# Patient Record
Sex: Female | Born: 1950 | Race: White | Hispanic: No | Marital: Single | Smoking: Former smoker
Health system: Southern US, Community
[De-identification: ages and names within clinical notes are randomized; demographics above are authoritative.]

## PROBLEM LIST (undated history)

## (undated) DIAGNOSIS — F32A Depression, unspecified: Secondary | ICD-10-CM

## (undated) DIAGNOSIS — J449 Chronic obstructive pulmonary disease, unspecified: Secondary | ICD-10-CM

## (undated) DIAGNOSIS — F419 Anxiety disorder, unspecified: Secondary | ICD-10-CM

## (undated) DIAGNOSIS — E785 Hyperlipidemia, unspecified: Secondary | ICD-10-CM

## (undated) DIAGNOSIS — D649 Anemia, unspecified: Secondary | ICD-10-CM

## (undated) DIAGNOSIS — I4891 Unspecified atrial fibrillation: Secondary | ICD-10-CM

## (undated) DIAGNOSIS — J45909 Unspecified asthma, uncomplicated: Secondary | ICD-10-CM

## (undated) DIAGNOSIS — Z87442 Personal history of urinary calculi: Secondary | ICD-10-CM

## (undated) DIAGNOSIS — I2510 Atherosclerotic heart disease of native coronary artery without angina pectoris: Secondary | ICD-10-CM

## (undated) DIAGNOSIS — K219 Gastro-esophageal reflux disease without esophagitis: Secondary | ICD-10-CM

## (undated) DIAGNOSIS — R519 Headache, unspecified: Secondary | ICD-10-CM

## (undated) DIAGNOSIS — J189 Pneumonia, unspecified organism: Secondary | ICD-10-CM

## (undated) DIAGNOSIS — F329 Major depressive disorder, single episode, unspecified: Secondary | ICD-10-CM

## (undated) DIAGNOSIS — K56609 Unspecified intestinal obstruction, unspecified as to partial versus complete obstruction: Secondary | ICD-10-CM

## (undated) DIAGNOSIS — M1990 Unspecified osteoarthritis, unspecified site: Secondary | ICD-10-CM

## (undated) DIAGNOSIS — I10 Essential (primary) hypertension: Secondary | ICD-10-CM

## (undated) DIAGNOSIS — M109 Gout, unspecified: Secondary | ICD-10-CM

## (undated) DIAGNOSIS — N189 Chronic kidney disease, unspecified: Secondary | ICD-10-CM

## (undated) HISTORY — DX: Essential (primary) hypertension: I10

## (undated) HISTORY — PX: ABDOMINAL HYSTERECTOMY: SHX81

## (undated) HISTORY — PX: APPENDECTOMY: SHX54

## (undated) HISTORY — PX: HERNIA REPAIR: SHX51

## (undated) HISTORY — DX: Gout, unspecified: M10.9

## (undated) HISTORY — PX: CARDIAC CATHETERIZATION: SHX172

## (undated) HISTORY — DX: Chronic kidney disease, unspecified: N18.9

## (undated) HISTORY — DX: Hyperlipidemia, unspecified: E78.5

## (undated) HISTORY — PX: ABDOMINAL SURGERY: SHX537

## (undated) HISTORY — PX: CORONARY ANGIOPLASTY: SHX604

## (undated) HISTORY — PX: FACIAL RECONSTRUCTION SURGERY: SHX631

---
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
## 2008-03-31 ENCOUNTER — Inpatient Hospital Stay (HOSPITAL_COMMUNITY): Admission: EM | Admit: 2008-03-31 | Discharge: 2008-04-03 | Payer: Self-pay | Admitting: Cardiovascular Disease

## 2008-03-31 ENCOUNTER — Ambulatory Visit: Payer: Self-pay | Admitting: Cardiovascular Disease

## 2010-05-09 LAB — DIFFERENTIAL
Basophils Relative: 0 % (ref 0–1)
Eosinophils Absolute: 0.2 10*3/uL (ref 0.0–0.7)
Lymphs Abs: 1.4 10*3/uL (ref 0.7–4.0)
Monocytes Absolute: 0.7 10*3/uL (ref 0.1–1.0)
Monocytes Relative: 10 % (ref 3–12)

## 2010-05-09 LAB — POCT I-STAT, CHEM 8
BUN: 25 mg/dL — ABNORMAL HIGH (ref 6–23)
Chloride: 110 mEq/L (ref 96–112)
Creatinine, Ser: 1.4 mg/dL — ABNORMAL HIGH (ref 0.4–1.2)
Glucose, Bld: 62 mg/dL — ABNORMAL LOW (ref 70–99)
Hemoglobin: 11.9 g/dL — ABNORMAL LOW (ref 12.0–15.0)
Potassium: 3.4 mEq/L — ABNORMAL LOW (ref 3.5–5.1)

## 2010-05-09 LAB — BASIC METABOLIC PANEL
BUN: 16 mg/dL (ref 6–23)
CO2: 28 mEq/L (ref 19–32)
Calcium: 9.3 mg/dL (ref 8.4–10.5)
Chloride: 100 mEq/L (ref 96–112)
GFR calc Af Amer: 42 mL/min — ABNORMAL LOW (ref >60)
GFR calc non Af Amer: 56 mL/min — ABNORMAL LOW (ref >60)
Potassium: 3.9 mEq/L (ref 3.5–5.1)
Potassium: 4.1 mEq/L (ref 3.5–5.1)
Sodium: 134 mEq/L — ABNORMAL LOW (ref 135–145)

## 2010-05-09 LAB — CBC
HCT: 38.3 % (ref 36.0–46.0)
Platelets: 231 10*3/uL (ref 150–400)
Platelets: 234 10*3/uL (ref 150–400)
RDW: 15.2 % (ref 11.5–15.5)
RDW: 15.2 % (ref 11.5–15.5)
WBC: 8.9 10*3/uL (ref 4.0–10.5)

## 2010-05-09 LAB — PROTIME-INR: Prothrombin Time: 12.3 seconds (ref 11.6–15.2)

## 2010-05-09 LAB — CARDIAC PANEL(CRET KIN+CKTOT+MB+TROPI)
CK, MB: 5.4 ng/mL — ABNORMAL HIGH (ref 0.3–4.0)
Relative Index: 4.6 — ABNORMAL HIGH (ref 0.0–2.5)
Total CK: 117 U/L (ref 7–177)
Troponin I: 3.84 ng/mL (ref 0.00–0.06)

## 2010-05-09 LAB — COMPREHENSIVE METABOLIC PANEL
ALT: 18 U/L (ref 0–35)
Albumin: 3.2 g/dL — ABNORMAL LOW (ref 3.5–5.2)
Alkaline Phosphatase: 87 U/L (ref 39–117)
Calcium: 9.3 mg/dL (ref 8.4–10.5)
GFR calc Af Amer: 42 mL/min — ABNORMAL LOW (ref >60)
Potassium: 3.8 mEq/L (ref 3.5–5.1)
Sodium: 139 mEq/L (ref 135–145)
Total Protein: 5.8 g/dL — ABNORMAL LOW (ref 6.0–8.3)

## 2010-05-09 LAB — CK TOTAL AND CKMB (NOT AT ARMC)
CK, MB: 75.7 ng/mL — ABNORMAL HIGH (ref 0.3–4.0)
CK, MB: 93.5 ng/mL — ABNORMAL HIGH (ref 0.3–4.0)
Relative Index: 13.9 — ABNORMAL HIGH (ref 0.0–2.5)
Relative Index: 15.2 — ABNORMAL HIGH (ref 0.0–2.5)
Total CK: 543 U/L — ABNORMAL HIGH (ref 7–177)
Total CK: 616 U/L — ABNORMAL HIGH (ref 7–177)

## 2010-05-09 LAB — TROPONIN I
Troponin I: 10.38 ng/mL (ref 0.00–0.06)
Troponin I: 4.53 ng/mL (ref 0.00–0.06)

## 2010-05-09 LAB — LIPID PANEL
HDL: 48 mg/dL (ref >39)
LDL Cholesterol: 93 mg/dL (ref 0–99)

## 2010-05-09 LAB — BRAIN NATRIURETIC PEPTIDE: Pro B Natriuretic peptide (BNP): 241 pg/mL — ABNORMAL HIGH (ref 0.0–100.0)

## 2010-05-09 LAB — TSH: TSH: 1.155 u[IU]/mL (ref 0.350–4.500)

## 2010-05-09 LAB — PLATELET COUNT: Platelets: 234 10*3/uL (ref 150–400)

## 2010-05-09 LAB — APTT: aPTT: 31 seconds (ref 24–37)

## 2010-05-09 LAB — GLUCOSE, CAPILLARY: Glucose-Capillary: 144 mg/dL — ABNORMAL HIGH (ref 70–99)

## 2010-06-11 NOTE — H&P (Signed)
NAMECHANNIN, MERKL NO.:  [id number]   MEDICAL RECORD NO.:  [id number]          PATIENT TYPE:  INP   LOCATION:  2909                         [facility]   PHYSICIAN:  Lauree Chandler, MDDATE OF BIRTH:  30-Nov-1950   DATE OF ADMISSION:  03/31/2008  DATE OF DISCHARGE:                              HISTORY & PHYSICAL   PRIMARY CARDIOLOGIST:  [facility] being seen by Juanda Bond.  Burt Knack, MD.   PATIENT PROFILE:  A 60 year old Caucasian female with prior history of  CAD who presents with acute inferolateral ST elevation MI.   PROBLEMS:  1. Acute inferolateral ST elevation MI/CAD.      a.     Status post MI x2 in 2003 and 2004 both times, both times       managed in [place] and she is status post PCI to the RCA.  2. Hypertension.  3. Hyperlipidemia.  4. Bipolar disorder.  5. Remote tobacco abuse.  6. Multiple personality disorder.  7. ADHD.  8. Borderline diabetes mellitus.  9. Status post TAH BSO.   HISTORY OF PRESENT ILLNESS:  A 60 year old Caucasian female with prior  history of CAD status post PCI to the RCA in [place].  She was in her  usual state of health until today while shopping, she developed sudden  onset of chest discomfort and was taken to the local urgent care in  [place].  ECG showed inferolateral ST elevation and code STEMI was  activated.  She was taken to the [facility] for emergent cath.  She continued to have chest pain on the table.   ALLERGIES:  PENICILLIN, MORPHINE, CODEINE, and DILAUDID.   HOME MEDICATIONS:  1. Metoprolol 200 mg daily.  2. Hyzaar 100 mg daily.  3. Prozac.  4. Plavix 75 mg daily.  5. Vytorin 10/40 mg daily.  6. Nitroglycerin p.r.n.  7. Pepcid AC.  8. Lamictal 25 mg daily.  9. Isosorbide 30 mg daily.  10.Vistaril 25 mg.  11.Protonix 40 mg daily.  12.Benadryl 25 mg p.r.n.   FAMILY HISTORY:  Mother died at age 83 with complications of coronary  artery disease and diabetes.  Father  is alive at age 43 with possible  history of cardiac aneurysm per patient.  She has 5 brothers and 7  sisters.  There is a history of MI in one of her brothers as well as  CABG.  There is a family history of diabetes.   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  She is on disability secondary to psychological issues.  She has  about a 15-pack year history of tobacco abuse, smoking 1/2 a pack a day  for 30 years, but quitting 6 weeks ago.  She previously used alcohol  quite heavily, but quit this many years ago.  She denies drug use.  She  does not routinely exercise.   REVIEW OF SYSTEMS:  Positive for chest pain, dyspnea, depression,  anxiety, bipolar disorder/mood disturbance, and borderline diabetes,  otherwise all systems reviewed are negative.  She is a full code.   PHYSICAL EXAMINATION:  VITAL SIGNS:  She is afebrile, heart rate 83,  respirations 18, blood pressure is 79/47 (on IV Nitro), pulse ox 100% on  room air.  GENERAL:  A pleasant white female in no acute distress, awake, alert,  and oriented x3.  HEENT:  Normal.  NEUROLOGIC:  Grossly intact and nonfocal.  PSYCH:  Normal affect.  SKIN:  Warm and dry without lesions or masses.  MUSCULOSKELETAL:  Grossly normal without deformity or effusion.  NECK:  No bruits or JVD.  LUNGS:  Regular, unlabored, clear to auscultation.  CARDIAC:  Regular S1 and S2.  No S3, S4, or murmurs.  ABDOMEN:  Soft, nontender, and nondistended.  Bowel sounds present.  EXTREMITIES:  Upper extremities warm and dry.  No clubbing, cyanosis, or  edema.  Dorsalis pedis, posterior tibial pulses are 2+ bilaterally.   LABORATORY DATA:  EKG shows sinus rhythm with 1-2 mm ST elevation in 2,  3, aVF, V5, and V6.  Lab work is pending.   ASSESSMENT/PLAN:  1. Acute inferolateral ST elevation myocardial infarction, emergent      cath, treated with aspirin, Plavix, beta-blocker, and statin.      Tobacco cessation and eventual  cardiac rehab.  2. Hypertension, currently hypotensive on IV nitroglycerin.  We will      try and wean as symptoms allow.  3. Tobacco abuse.  The patient quit 6 weeks ago was seen by Tobacco      Cessation Counsel Team.  4. Hyperlipidemia, continue high-dose statin therapy.  5. Multiple personalities/panic attacks, and bipolar disorder,      continue home medications.     Murray Hodgkins, ANP      Lauree Chandler, MD    CB/MEDQ  D:  03/31/2008  T:  04/01/2008  Job:  [id number]

## 2010-06-11 NOTE — Discharge Summary (Signed)
Christina [name], Christina [name]            ACCOUNT NO.:  [id number]   MEDICAL RECORD NO.:  [id number]          PATIENT TYPE:  INP   LOCATION:  4733                         FACILITY:  [facility]   PHYSICIAN:  Juanda Bond. Burt Knack, MD  DATE OF BIRTH:  01-Sep-1950   DATE OF ADMISSION:  03/31/2008  DATE OF DISCHARGE:  04/03/2008                               DISCHARGE SUMMARY   PRIMARY CARDIOLOGIST:  Jenne Campus, MD, in [place], fax number is  [phone].   DISCHARGE DIAGNOSES:  Coronary artery disease status post inferolateral  myocardial infarction secondary to distal obtuse marginal occlusion.  The patient underwent percutaneous transluminal coronary angioplasty  this admission by Dr. Sherren Mocha.  The patient did not have a stent  placed.  A.  Patent right coronary artery and left anterior descending coronary  artery stents.  B.  Mild segmental left ventricular dysfunction.  C.  The patient underwent successful reperfusion with wire and balloon  crossing only but severe residual stenosis.  The vessel was too small  for balloon angioplasty or stenting.   PAST MEDICAL HISTORY:  1. Coronary artery disease status post acute MI x2 in 2003 and 2004,      both times managed in [place], status post PCI to the RCA.  2. Hypertension.  3. Hyperlipidemia.  4. Bipolar disorder.  5. Remote tobacco abuse.  6. Multiple personality disorder.  7. ADHD.  8. Borderline diabetes.  9. Status post total abdominal hysterectomy.   ALLERGIES:  1. PENICILLIN.  2. MORPHINE.  3. CODEINE.  4. DILAUDID.   Christina [name] is a 60 year old Caucasian female who lives in [place] area  presented this admission after being seen at Urgent Care in [place]  where her EKG which showed to have inferolateral ST elevation.  Code  STEMI as activated.  The patient was transferred to [facility] cath lab  emergently.  She continued to have chest pain on arrival.  The patient  was treated with aspirin, Plavix, beta-blocker,  and statin.  Cardiac  catheterization was obtained by Dr. Sherren Mocha.  The patient found  to have left main widely patent proximally and distal left main 30-40%  stenosis, left main bifurcated into the LAD and left circumflex.  LAD  diffusely diseased, widely patent stent in the proximal LAD with 20-30%  diffuse restenosis with mild calcification in mid LAD.  The origin of  the first diagonal branch had 50% stenosis.  Second diagonal branch,  there was small vessel 30-40% stenosis present.  Left circumflex, first  OM had a 50% proximal stenosis.  The second OM normal through its  proximal and midportions until it occluded in the distal portion of the  vessel.  RCA, mild diffuse disease, patent stent in the midportion with  20% diffuse restenosis.  RCA bifurcated into the PDA and PL branch.  EF  of 55-60%.  The patient with acute inferolateral myocardial infarction  secondary to small distal obtuse marginal occlusion.  The patient  underwent successful reperfusion with wire and balloon crossing only  with severe residual stenosis.  As noted, the vessel was too small for  balloon angioplasty or stenting.  The patient will be continued with  medical therapy.  Smoking cessation strongly encouraged.  The patient  continued to do well, was transferred to telemetry.  Dr. Burt Knack went to  see the patient on day of discharge.  Cath site stable.  Troponin peaked  at 10.4, currently 3.8.  Potassium 4.1 and creatinine 1.5.  The patient  afebrile and blood pressure 117/67.  The patient stable to be discharged  home.  Follow up with Dr. Agustin Cree in [place].   MEDICATIONS AT TIME OF DISCHARGE:  1. Nitroglycerin p.r.n.  2. Aspirin 325 mg daily.   The patient is instructed to resume her medications as previously  prescribed including:  1. Benadryl p.r.n.  2. Trazodone.  3. Nebulizers.  4. Toprol-XL 200.  5. Hyzaar 100/12.5.  6. Prozac 40 b.i.d.  7. Plavix 75.  8. Vytorin 10/40 daily,.  9.  Pepcid or Protonix as previously prescribed.  10.Lamictal 25 mg, per patient's report 4 tablets b.i.d.  11.Isosorbide mononitrate 30 mg daily.  12.Vistaril 50 mg twice daily or as previously prescribed.   DURATION OF DISCHARGE ENCOUNTER:  Over 30 minutes.      [name], ACNP      Juanda Bond. Burt Knack, MD  Electronically Signed    MB/MEDQ  D:  04/03/2008  T:  04/03/2008  Job:  [id number]   cc:   Jenne Campus

## 2010-06-11 NOTE — Cardiovascular Report (Signed)
NAMEDANESE, BERNABEI            ACCOUNT NO.:  [id number]   MEDICAL RECORD NO.:  [id number]          PATIENT TYPE:  INP   LOCATION:  2909                         FACILITY:  [facility]   PHYSICIAN:  Juanda Bond. Burt Knack, MD  DATE OF BIRTH:  Jun 26, 1950   DATE OF PROCEDURE:  03/31/2008  DATE OF DISCHARGE:                            CARDIAC CATHETERIZATION   PROCEDURES:  1. Left heart catheterization.  2. Selective coronary angiography.  3. Left ventricular angiography.  4. Aortic arch angiography.  5. PTCA with only balloon crossing of the OM-2.  6. Angio-Seal of the right femoral artery.   INDICATIONS:  Ms. Flum is a 60 year old woman who presented acutely as  a direct patient to the cath lab in the setting of an inferolateral  STEMI.  She began having chest pain at 12:30 this afternoon and called  EMS.  EKG showed ST elevation in the inferolateral leads and she was  brought emergently for cardiac catheterization.  At the time of arrival,  she complained of severe 10/10 chest pain.  She has had previous  stenting in [place] back in 2003 and 2004 by her report.  No records  are available at the time of her procedure.   Risks and indications of procedure were reviewed with the patient.  Informed consent was obtained.  Right groin was prepped, draped, and  anesthetized with 1% lidocaine.  Using modified Seldinger technique, a 6-  French sheath was placed in the right femoral artery.  Images of the  left coronary artery were taken first.  This was followed by placement  of a JR-4 guide as the right coronary artery was suspected to be the  culprit.  Images showed a widely patent right coronary artery with  widely patent stent.  Films were then carefully reviewed and there was  distal occlusion of a very small OM-2 branch.  The vessel appears to be  no bigger than 1 mm.  Because the patient was having severe pain, we  elected to go ahead and tried to pass a wire across the occlusion to  see  how much distal vessel territory was applied.  Heparin and Integrilin  were used for anticoagulation.  An XB 3.5 guide catheter was used.  The  lesion was wired with a moderate amount of difficulty because of its  distal nature and tortuosity, but was successfully wired with a Cougar  guidewire.  The vessel was too small to dilate with the balloon.  I took  a 1.5 x 8-mm balloon across the lesion to dotter the vessel.  Following crossing of the balloon, reperfusion occurred.  There was TIMI  II to III flow in the vessel, improved from TIMI 0 initially.  However,  the patient's pain did not improve much.  I thought that she had  received the maximum benefit of percutaneous intervention because of the  small vessel size and inability to perform balloon inflation or  stenting.  At that point, a pigtail catheter was placed in the left  ventricle and ventriculography was performed.  Pigtail catheter was then  pulled back into the aorta and aortography was performed  to rule out  dissection.  This was done because the patient had severe pain as well  as severe hypertension, which seemed that it could be out of proportion  to the small myocardial territory supplied by this distal OM branch.  The aortogram demonstrated no major abnormality.  A femoral arteriogram  was then performed and Angio-Seal device was deployed to close the  femoral arteriotomy.   FINDINGS:  Aortic pressure 163/84 with a mean of 118, left ventricular  pressure 161/18.   Coronary angiography:  Left mainstem is widely patent proximally.  The  distal left main has 30-40% stenosis.  There is mild calcification  present.  The left main bifurcates into the LAD and left circumflex.   LAD:  The LAD is a moderate-sized vessel.  It is diffusely diseased and  courses down to wrap around the LV apex.  There is a widely patent stent  in the proximal LAD with 20-30% diffuse restenosis with mild  calcification.  The mid-LAD, the  origin of first diagonal branch has a  50% stenosis.  There is a second diagonal branch where there is a small  vessel 30-40% stenosis present.  The remaining portions of the LAD have  no significant disease.  The diagonal branches are small and they have  no significant disease.   Left circumflex:  The proximal circumflex has mild stenosis, but it is  nonobstructive.  The first OM has a 50% proximal stenosis.  The second  OM is normal through its proximal and midportions until it occludes in  the distal portion of the vessel.  The AV groove circumflex is very  small and has no significant disease.   Right coronary artery:  The right coronary artery has mild diffuse  disease.  There is a patent stent in the midportion with 20% diffuse  restenosis.  Proximal vessel has 30% stenosis.  Distal vessel has no  significant disease.  Distally, the RCA bifurcates into the PDA and PL  branch.   Left ventriculography shows focal inferolateral akinesis with an overall  LVEF of 55-60%.  There is no mitral regurgitation.   Aortic root angiography demonstrates mildly dilated aortic root, but no  evidence of dissection, otherwise normal.  The descending and thoracic  aorta was well seen and did not show any evidence of dissection.   ASSESSMENT:  1. Acute inferolateral myocardial infarction secondary to small distal      obtuse marginal occlusion.  2. Patent right coronary artery and left anterior descending coronary      artery stents.  3. Mild segmental left ventricular dysfunction.  4. Successful reperfusion with wire and balloon crossing only, but      severe residual stenosis.  As noted, the vessel was too small for      balloon angioplasty or stenting.   RECOMMENDATIONS:  I recommended medical therapy with aspirin, Plavix,  and Integrilin.  We will monitor the patient in the CCU.  Her blood  pressure will be monitored closely and managed aggressively in the  setting of her acute  infarction.  We have started her on nitroglycerin  drip in the cath lab.  She has also been given intravenous labetalol.      Juanda Bond. Burt Knack, MD  Electronically Signed     MDC/MEDQ  D:  03/31/2008  T:  04/01/2008  Job:  [id number]

## 2016-05-30 DIAGNOSIS — R7989 Other specified abnormal findings of blood chemistry: Secondary | ICD-10-CM | POA: Diagnosis not present

## 2016-05-30 DIAGNOSIS — R160 Hepatomegaly, not elsewhere classified: Secondary | ICD-10-CM

## 2016-05-30 DIAGNOSIS — I5021 Acute systolic (congestive) heart failure: Secondary | ICD-10-CM | POA: Diagnosis not present

## 2016-05-30 DIAGNOSIS — J90 Pleural effusion, not elsewhere classified: Secondary | ICD-10-CM

## 2016-05-30 DIAGNOSIS — I10 Essential (primary) hypertension: Secondary | ICD-10-CM | POA: Diagnosis not present

## 2016-05-30 DIAGNOSIS — R0609 Other forms of dyspnea: Secondary | ICD-10-CM

## 2016-05-31 DIAGNOSIS — I10 Essential (primary) hypertension: Secondary | ICD-10-CM | POA: Diagnosis not present

## 2016-05-31 DIAGNOSIS — R160 Hepatomegaly, not elsewhere classified: Secondary | ICD-10-CM | POA: Diagnosis not present

## 2016-05-31 DIAGNOSIS — R0609 Other forms of dyspnea: Secondary | ICD-10-CM | POA: Diagnosis not present

## 2016-05-31 DIAGNOSIS — J90 Pleural effusion, not elsewhere classified: Secondary | ICD-10-CM | POA: Diagnosis not present

## 2016-06-01 DIAGNOSIS — I10 Essential (primary) hypertension: Secondary | ICD-10-CM | POA: Diagnosis not present

## 2016-06-01 DIAGNOSIS — R079 Chest pain, unspecified: Secondary | ICD-10-CM | POA: Diagnosis not present

## 2016-06-01 DIAGNOSIS — R0609 Other forms of dyspnea: Secondary | ICD-10-CM | POA: Diagnosis not present

## 2016-06-01 DIAGNOSIS — J90 Pleural effusion, not elsewhere classified: Secondary | ICD-10-CM | POA: Diagnosis not present

## 2016-06-01 DIAGNOSIS — R160 Hepatomegaly, not elsewhere classified: Secondary | ICD-10-CM | POA: Diagnosis not present

## 2016-06-02 DIAGNOSIS — I10 Essential (primary) hypertension: Secondary | ICD-10-CM | POA: Diagnosis not present

## 2016-06-02 DIAGNOSIS — R160 Hepatomegaly, not elsewhere classified: Secondary | ICD-10-CM | POA: Diagnosis not present

## 2016-06-02 DIAGNOSIS — R0609 Other forms of dyspnea: Secondary | ICD-10-CM | POA: Diagnosis not present

## 2016-06-02 DIAGNOSIS — R079 Chest pain, unspecified: Secondary | ICD-10-CM | POA: Diagnosis not present

## 2016-06-02 DIAGNOSIS — J90 Pleural effusion, not elsewhere classified: Secondary | ICD-10-CM | POA: Diagnosis not present

## 2016-06-03 DIAGNOSIS — J90 Pleural effusion, not elsewhere classified: Secondary | ICD-10-CM | POA: Diagnosis not present

## 2016-06-03 DIAGNOSIS — I10 Essential (primary) hypertension: Secondary | ICD-10-CM | POA: Diagnosis not present

## 2016-06-03 DIAGNOSIS — R079 Chest pain, unspecified: Secondary | ICD-10-CM | POA: Diagnosis not present

## 2016-06-03 DIAGNOSIS — R160 Hepatomegaly, not elsewhere classified: Secondary | ICD-10-CM | POA: Diagnosis not present

## 2016-06-03 DIAGNOSIS — R0609 Other forms of dyspnea: Secondary | ICD-10-CM | POA: Diagnosis not present

## 2016-07-11 DIAGNOSIS — I2510 Atherosclerotic heart disease of native coronary artery without angina pectoris: Secondary | ICD-10-CM | POA: Diagnosis not present

## 2016-07-11 DIAGNOSIS — I5032 Chronic diastolic (congestive) heart failure: Secondary | ICD-10-CM

## 2016-07-11 DIAGNOSIS — E1122 Type 2 diabetes mellitus with diabetic chronic kidney disease: Secondary | ICD-10-CM | POA: Diagnosis not present

## 2016-07-11 DIAGNOSIS — Z720 Tobacco use: Secondary | ICD-10-CM | POA: Diagnosis not present

## 2016-07-11 DIAGNOSIS — K089 Disorder of teeth and supporting structures, unspecified: Secondary | ICD-10-CM | POA: Diagnosis not present

## 2016-07-11 DIAGNOSIS — L03211 Cellulitis of face: Secondary | ICD-10-CM | POA: Diagnosis not present

## 2016-07-12 DIAGNOSIS — I5032 Chronic diastolic (congestive) heart failure: Secondary | ICD-10-CM | POA: Diagnosis not present

## 2016-07-12 DIAGNOSIS — Z720 Tobacco use: Secondary | ICD-10-CM | POA: Diagnosis not present

## 2016-07-12 DIAGNOSIS — K089 Disorder of teeth and supporting structures, unspecified: Secondary | ICD-10-CM | POA: Diagnosis not present

## 2016-07-12 DIAGNOSIS — L03211 Cellulitis of face: Secondary | ICD-10-CM | POA: Diagnosis not present

## 2016-12-05 DIAGNOSIS — E785 Hyperlipidemia, unspecified: Secondary | ICD-10-CM | POA: Diagnosis not present

## 2016-12-05 DIAGNOSIS — K089 Disorder of teeth and supporting structures, unspecified: Secondary | ICD-10-CM | POA: Diagnosis not present

## 2016-12-05 DIAGNOSIS — N184 Chronic kidney disease, stage 4 (severe): Secondary | ICD-10-CM

## 2016-12-05 DIAGNOSIS — I2510 Atherosclerotic heart disease of native coronary artery without angina pectoris: Secondary | ICD-10-CM | POA: Diagnosis not present

## 2016-12-05 DIAGNOSIS — I249 Acute ischemic heart disease, unspecified: Secondary | ICD-10-CM | POA: Diagnosis not present

## 2016-12-05 DIAGNOSIS — K56609 Unspecified intestinal obstruction, unspecified as to partial versus complete obstruction: Secondary | ICD-10-CM | POA: Diagnosis not present

## 2016-12-05 DIAGNOSIS — R109 Unspecified abdominal pain: Secondary | ICD-10-CM | POA: Diagnosis not present

## 2016-12-06 DIAGNOSIS — F1410 Cocaine abuse, uncomplicated: Secondary | ICD-10-CM | POA: Diagnosis not present

## 2016-12-06 DIAGNOSIS — N184 Chronic kidney disease, stage 4 (severe): Secondary | ICD-10-CM | POA: Diagnosis not present

## 2016-12-06 DIAGNOSIS — K56609 Unspecified intestinal obstruction, unspecified as to partial versus complete obstruction: Secondary | ICD-10-CM | POA: Diagnosis not present

## 2016-12-07 DIAGNOSIS — K56609 Unspecified intestinal obstruction, unspecified as to partial versus complete obstruction: Secondary | ICD-10-CM | POA: Diagnosis not present

## 2016-12-07 DIAGNOSIS — R109 Unspecified abdominal pain: Secondary | ICD-10-CM | POA: Diagnosis not present

## 2016-12-07 DIAGNOSIS — N184 Chronic kidney disease, stage 4 (severe): Secondary | ICD-10-CM | POA: Diagnosis not present

## 2016-12-07 DIAGNOSIS — F1410 Cocaine abuse, uncomplicated: Secondary | ICD-10-CM | POA: Diagnosis not present

## 2016-12-08 DIAGNOSIS — N184 Chronic kidney disease, stage 4 (severe): Secondary | ICD-10-CM | POA: Diagnosis not present

## 2016-12-08 DIAGNOSIS — K56609 Unspecified intestinal obstruction, unspecified as to partial versus complete obstruction: Secondary | ICD-10-CM | POA: Diagnosis not present

## 2016-12-08 DIAGNOSIS — F1410 Cocaine abuse, uncomplicated: Secondary | ICD-10-CM | POA: Diagnosis not present

## 2016-12-09 DIAGNOSIS — K56609 Unspecified intestinal obstruction, unspecified as to partial versus complete obstruction: Secondary | ICD-10-CM | POA: Diagnosis not present

## 2016-12-09 DIAGNOSIS — F1410 Cocaine abuse, uncomplicated: Secondary | ICD-10-CM | POA: Diagnosis not present

## 2016-12-09 DIAGNOSIS — N184 Chronic kidney disease, stage 4 (severe): Secondary | ICD-10-CM | POA: Diagnosis not present

## 2017-04-19 DIAGNOSIS — Z7982 Long term (current) use of aspirin: Secondary | ICD-10-CM | POA: Diagnosis not present

## 2017-04-19 DIAGNOSIS — F1410 Cocaine abuse, uncomplicated: Secondary | ICD-10-CM | POA: Diagnosis not present

## 2017-04-19 DIAGNOSIS — Z7902 Long term (current) use of antithrombotics/antiplatelets: Secondary | ICD-10-CM | POA: Diagnosis not present

## 2017-04-19 DIAGNOSIS — T405X1A Poisoning by cocaine, accidental (unintentional), initial encounter: Secondary | ICD-10-CM | POA: Diagnosis not present

## 2017-04-19 DIAGNOSIS — J449 Chronic obstructive pulmonary disease, unspecified: Secondary | ICD-10-CM | POA: Diagnosis not present

## 2017-04-19 DIAGNOSIS — F17210 Nicotine dependence, cigarettes, uncomplicated: Secondary | ICD-10-CM | POA: Diagnosis not present

## 2017-04-19 DIAGNOSIS — N185 Chronic kidney disease, stage 5: Secondary | ICD-10-CM | POA: Diagnosis not present

## 2017-04-19 DIAGNOSIS — I2510 Atherosclerotic heart disease of native coronary artery without angina pectoris: Secondary | ICD-10-CM | POA: Diagnosis not present

## 2017-04-19 DIAGNOSIS — R079 Chest pain, unspecified: Secondary | ICD-10-CM | POA: Diagnosis not present

## 2017-04-20 DIAGNOSIS — R079 Chest pain, unspecified: Secondary | ICD-10-CM | POA: Diagnosis not present

## 2017-04-20 DIAGNOSIS — I2510 Atherosclerotic heart disease of native coronary artery without angina pectoris: Secondary | ICD-10-CM | POA: Diagnosis not present

## 2017-04-20 DIAGNOSIS — F17210 Nicotine dependence, cigarettes, uncomplicated: Secondary | ICD-10-CM | POA: Diagnosis not present

## 2017-04-20 DIAGNOSIS — T405X1A Poisoning by cocaine, accidental (unintentional), initial encounter: Secondary | ICD-10-CM | POA: Diagnosis not present

## 2018-01-19 DIAGNOSIS — I2510 Atherosclerotic heart disease of native coronary artery without angina pectoris: Secondary | ICD-10-CM

## 2018-01-19 DIAGNOSIS — R109 Unspecified abdominal pain: Secondary | ICD-10-CM

## 2018-01-19 DIAGNOSIS — N179 Acute kidney failure, unspecified: Secondary | ICD-10-CM | POA: Diagnosis not present

## 2018-01-19 DIAGNOSIS — F329 Major depressive disorder, single episode, unspecified: Secondary | ICD-10-CM

## 2018-01-19 DIAGNOSIS — I5032 Chronic diastolic (congestive) heart failure: Secondary | ICD-10-CM

## 2018-01-19 DIAGNOSIS — K565 Intestinal adhesions [bands], unspecified as to partial versus complete obstruction: Secondary | ICD-10-CM

## 2018-01-19 DIAGNOSIS — N184 Chronic kidney disease, stage 4 (severe): Secondary | ICD-10-CM

## 2018-01-19 DIAGNOSIS — E1122 Type 2 diabetes mellitus with diabetic chronic kidney disease: Secondary | ICD-10-CM

## 2018-01-19 DIAGNOSIS — F1410 Cocaine abuse, uncomplicated: Secondary | ICD-10-CM

## 2018-01-19 DIAGNOSIS — J449 Chronic obstructive pulmonary disease, unspecified: Secondary | ICD-10-CM

## 2018-01-20 DIAGNOSIS — R109 Unspecified abdominal pain: Secondary | ICD-10-CM | POA: Diagnosis not present

## 2018-01-20 DIAGNOSIS — I5032 Chronic diastolic (congestive) heart failure: Secondary | ICD-10-CM | POA: Diagnosis not present

## 2018-01-20 DIAGNOSIS — K565 Intestinal adhesions [bands], unspecified as to partial versus complete obstruction: Secondary | ICD-10-CM | POA: Diagnosis not present

## 2018-01-20 DIAGNOSIS — N179 Acute kidney failure, unspecified: Secondary | ICD-10-CM | POA: Diagnosis not present

## 2018-01-21 DIAGNOSIS — R109 Unspecified abdominal pain: Secondary | ICD-10-CM | POA: Diagnosis not present

## 2018-01-21 DIAGNOSIS — R7989 Other specified abnormal findings of blood chemistry: Secondary | ICD-10-CM | POA: Diagnosis not present

## 2018-01-21 DIAGNOSIS — N179 Acute kidney failure, unspecified: Secondary | ICD-10-CM | POA: Diagnosis not present

## 2018-01-21 DIAGNOSIS — K565 Intestinal adhesions [bands], unspecified as to partial versus complete obstruction: Secondary | ICD-10-CM | POA: Diagnosis not present

## 2018-01-21 DIAGNOSIS — I480 Paroxysmal atrial fibrillation: Secondary | ICD-10-CM | POA: Diagnosis not present

## 2018-01-21 DIAGNOSIS — R079 Chest pain, unspecified: Secondary | ICD-10-CM | POA: Diagnosis not present

## 2018-01-21 DIAGNOSIS — I5032 Chronic diastolic (congestive) heart failure: Secondary | ICD-10-CM | POA: Diagnosis not present

## 2018-01-22 DIAGNOSIS — N179 Acute kidney failure, unspecified: Secondary | ICD-10-CM | POA: Diagnosis not present

## 2018-01-22 DIAGNOSIS — I5032 Chronic diastolic (congestive) heart failure: Secondary | ICD-10-CM | POA: Diagnosis not present

## 2018-01-22 DIAGNOSIS — K565 Intestinal adhesions [bands], unspecified as to partial versus complete obstruction: Secondary | ICD-10-CM | POA: Diagnosis not present

## 2018-01-22 DIAGNOSIS — R109 Unspecified abdominal pain: Secondary | ICD-10-CM | POA: Diagnosis not present

## 2018-01-23 DIAGNOSIS — N179 Acute kidney failure, unspecified: Secondary | ICD-10-CM | POA: Diagnosis not present

## 2018-01-23 DIAGNOSIS — R109 Unspecified abdominal pain: Secondary | ICD-10-CM | POA: Diagnosis not present

## 2018-01-23 DIAGNOSIS — I5032 Chronic diastolic (congestive) heart failure: Secondary | ICD-10-CM | POA: Diagnosis not present

## 2018-01-23 DIAGNOSIS — K565 Intestinal adhesions [bands], unspecified as to partial versus complete obstruction: Secondary | ICD-10-CM | POA: Diagnosis not present

## 2018-01-26 DIAGNOSIS — N184 Chronic kidney disease, stage 4 (severe): Secondary | ICD-10-CM

## 2018-01-26 DIAGNOSIS — K56609 Unspecified intestinal obstruction, unspecified as to partial versus complete obstruction: Secondary | ICD-10-CM

## 2018-01-26 DIAGNOSIS — Z720 Tobacco use: Secondary | ICD-10-CM

## 2018-01-26 DIAGNOSIS — R109 Unspecified abdominal pain: Secondary | ICD-10-CM

## 2018-01-26 DIAGNOSIS — J449 Chronic obstructive pulmonary disease, unspecified: Secondary | ICD-10-CM

## 2018-01-26 DIAGNOSIS — E1122 Type 2 diabetes mellitus with diabetic chronic kidney disease: Secondary | ICD-10-CM | POA: Diagnosis not present

## 2018-01-26 DIAGNOSIS — I2510 Atherosclerotic heart disease of native coronary artery without angina pectoris: Secondary | ICD-10-CM

## 2018-01-26 DIAGNOSIS — N179 Acute kidney failure, unspecified: Secondary | ICD-10-CM

## 2018-01-27 DIAGNOSIS — K56609 Unspecified intestinal obstruction, unspecified as to partial versus complete obstruction: Secondary | ICD-10-CM | POA: Diagnosis not present

## 2018-01-27 DIAGNOSIS — R109 Unspecified abdominal pain: Secondary | ICD-10-CM | POA: Diagnosis not present

## 2018-01-27 DIAGNOSIS — E1122 Type 2 diabetes mellitus with diabetic chronic kidney disease: Secondary | ICD-10-CM | POA: Diagnosis not present

## 2018-01-27 DIAGNOSIS — N179 Acute kidney failure, unspecified: Secondary | ICD-10-CM | POA: Diagnosis not present

## 2018-01-28 DIAGNOSIS — R109 Unspecified abdominal pain: Secondary | ICD-10-CM | POA: Diagnosis not present

## 2018-01-28 DIAGNOSIS — K56609 Unspecified intestinal obstruction, unspecified as to partial versus complete obstruction: Secondary | ICD-10-CM | POA: Diagnosis not present

## 2018-01-28 DIAGNOSIS — N179 Acute kidney failure, unspecified: Secondary | ICD-10-CM | POA: Diagnosis not present

## 2018-01-28 DIAGNOSIS — E1122 Type 2 diabetes mellitus with diabetic chronic kidney disease: Secondary | ICD-10-CM | POA: Diagnosis not present

## 2018-01-29 DIAGNOSIS — K56609 Unspecified intestinal obstruction, unspecified as to partial versus complete obstruction: Secondary | ICD-10-CM | POA: Diagnosis not present

## 2018-01-29 DIAGNOSIS — N179 Acute kidney failure, unspecified: Secondary | ICD-10-CM | POA: Diagnosis not present

## 2018-01-29 DIAGNOSIS — R109 Unspecified abdominal pain: Secondary | ICD-10-CM | POA: Diagnosis not present

## 2018-01-29 DIAGNOSIS — E1122 Type 2 diabetes mellitus with diabetic chronic kidney disease: Secondary | ICD-10-CM | POA: Diagnosis not present

## 2018-02-05 DIAGNOSIS — I5032 Chronic diastolic (congestive) heart failure: Secondary | ICD-10-CM

## 2018-02-05 DIAGNOSIS — R109 Unspecified abdominal pain: Secondary | ICD-10-CM | POA: Diagnosis not present

## 2018-02-05 DIAGNOSIS — N184 Chronic kidney disease, stage 4 (severe): Secondary | ICD-10-CM

## 2018-02-05 DIAGNOSIS — E785 Hyperlipidemia, unspecified: Secondary | ICD-10-CM

## 2018-02-05 DIAGNOSIS — E1122 Type 2 diabetes mellitus with diabetic chronic kidney disease: Secondary | ICD-10-CM

## 2018-02-05 DIAGNOSIS — N179 Acute kidney failure, unspecified: Secondary | ICD-10-CM

## 2018-02-05 DIAGNOSIS — K56609 Unspecified intestinal obstruction, unspecified as to partial versus complete obstruction: Secondary | ICD-10-CM

## 2018-02-05 DIAGNOSIS — J449 Chronic obstructive pulmonary disease, unspecified: Secondary | ICD-10-CM

## 2018-02-06 DIAGNOSIS — N184 Chronic kidney disease, stage 4 (severe): Secondary | ICD-10-CM | POA: Diagnosis not present

## 2018-02-06 DIAGNOSIS — N179 Acute kidney failure, unspecified: Secondary | ICD-10-CM | POA: Diagnosis not present

## 2018-02-06 DIAGNOSIS — R109 Unspecified abdominal pain: Secondary | ICD-10-CM | POA: Diagnosis not present

## 2018-02-06 DIAGNOSIS — K56609 Unspecified intestinal obstruction, unspecified as to partial versus complete obstruction: Secondary | ICD-10-CM | POA: Diagnosis not present

## 2018-02-07 DIAGNOSIS — N179 Acute kidney failure, unspecified: Secondary | ICD-10-CM | POA: Diagnosis not present

## 2018-02-07 DIAGNOSIS — R109 Unspecified abdominal pain: Secondary | ICD-10-CM | POA: Diagnosis not present

## 2018-02-07 DIAGNOSIS — K56609 Unspecified intestinal obstruction, unspecified as to partial versus complete obstruction: Secondary | ICD-10-CM | POA: Diagnosis not present

## 2018-02-07 DIAGNOSIS — N184 Chronic kidney disease, stage 4 (severe): Secondary | ICD-10-CM | POA: Diagnosis not present

## 2018-02-08 DIAGNOSIS — R109 Unspecified abdominal pain: Secondary | ICD-10-CM | POA: Diagnosis not present

## 2018-02-08 DIAGNOSIS — N184 Chronic kidney disease, stage 4 (severe): Secondary | ICD-10-CM | POA: Diagnosis not present

## 2018-02-08 DIAGNOSIS — N179 Acute kidney failure, unspecified: Secondary | ICD-10-CM | POA: Diagnosis not present

## 2018-02-08 DIAGNOSIS — K56609 Unspecified intestinal obstruction, unspecified as to partial versus complete obstruction: Secondary | ICD-10-CM | POA: Diagnosis not present

## 2018-02-09 DIAGNOSIS — N184 Chronic kidney disease, stage 4 (severe): Secondary | ICD-10-CM | POA: Diagnosis not present

## 2018-02-09 DIAGNOSIS — N179 Acute kidney failure, unspecified: Secondary | ICD-10-CM | POA: Diagnosis not present

## 2018-02-09 DIAGNOSIS — K56609 Unspecified intestinal obstruction, unspecified as to partial versus complete obstruction: Secondary | ICD-10-CM | POA: Diagnosis not present

## 2018-02-09 DIAGNOSIS — R109 Unspecified abdominal pain: Secondary | ICD-10-CM | POA: Diagnosis not present

## 2018-02-10 DIAGNOSIS — I4891 Unspecified atrial fibrillation: Secondary | ICD-10-CM | POA: Diagnosis not present

## 2018-02-10 DIAGNOSIS — D62 Acute posthemorrhagic anemia: Secondary | ICD-10-CM | POA: Diagnosis not present

## 2018-02-10 DIAGNOSIS — K56609 Unspecified intestinal obstruction, unspecified as to partial versus complete obstruction: Secondary | ICD-10-CM | POA: Diagnosis not present

## 2018-02-10 DIAGNOSIS — N179 Acute kidney failure, unspecified: Secondary | ICD-10-CM | POA: Diagnosis not present

## 2018-02-11 DIAGNOSIS — N179 Acute kidney failure, unspecified: Secondary | ICD-10-CM | POA: Diagnosis not present

## 2018-02-11 DIAGNOSIS — K56609 Unspecified intestinal obstruction, unspecified as to partial versus complete obstruction: Secondary | ICD-10-CM | POA: Diagnosis not present

## 2018-02-11 DIAGNOSIS — I4891 Unspecified atrial fibrillation: Secondary | ICD-10-CM | POA: Diagnosis not present

## 2018-02-11 DIAGNOSIS — D62 Acute posthemorrhagic anemia: Secondary | ICD-10-CM | POA: Diagnosis not present

## 2018-02-12 DIAGNOSIS — N179 Acute kidney failure, unspecified: Secondary | ICD-10-CM | POA: Diagnosis not present

## 2018-02-12 DIAGNOSIS — I4891 Unspecified atrial fibrillation: Secondary | ICD-10-CM | POA: Diagnosis not present

## 2018-02-12 DIAGNOSIS — D62 Acute posthemorrhagic anemia: Secondary | ICD-10-CM | POA: Diagnosis not present

## 2018-02-12 DIAGNOSIS — K56609 Unspecified intestinal obstruction, unspecified as to partial versus complete obstruction: Secondary | ICD-10-CM | POA: Diagnosis not present

## 2018-02-13 DIAGNOSIS — I4891 Unspecified atrial fibrillation: Secondary | ICD-10-CM | POA: Diagnosis not present

## 2018-02-13 DIAGNOSIS — D62 Acute posthemorrhagic anemia: Secondary | ICD-10-CM | POA: Diagnosis not present

## 2018-02-13 DIAGNOSIS — K56609 Unspecified intestinal obstruction, unspecified as to partial versus complete obstruction: Secondary | ICD-10-CM | POA: Diagnosis not present

## 2018-02-13 DIAGNOSIS — N179 Acute kidney failure, unspecified: Secondary | ICD-10-CM | POA: Diagnosis not present

## 2018-04-04 DIAGNOSIS — R079 Chest pain, unspecified: Secondary | ICD-10-CM

## 2018-04-04 DIAGNOSIS — J449 Chronic obstructive pulmonary disease, unspecified: Secondary | ICD-10-CM

## 2018-04-04 DIAGNOSIS — I340 Nonrheumatic mitral (valve) insufficiency: Secondary | ICD-10-CM

## 2018-04-04 DIAGNOSIS — I4891 Unspecified atrial fibrillation: Secondary | ICD-10-CM

## 2018-04-04 DIAGNOSIS — N184 Chronic kidney disease, stage 4 (severe): Secondary | ICD-10-CM

## 2018-04-04 DIAGNOSIS — Z720 Tobacco use: Secondary | ICD-10-CM

## 2018-04-04 DIAGNOSIS — I10 Essential (primary) hypertension: Secondary | ICD-10-CM | POA: Diagnosis not present

## 2018-04-04 DIAGNOSIS — I361 Nonrheumatic tricuspid (valve) insufficiency: Secondary | ICD-10-CM

## 2018-04-04 DIAGNOSIS — E440 Moderate protein-calorie malnutrition: Secondary | ICD-10-CM

## 2018-04-05 DIAGNOSIS — I10 Essential (primary) hypertension: Secondary | ICD-10-CM | POA: Diagnosis not present

## 2018-04-05 DIAGNOSIS — I4891 Unspecified atrial fibrillation: Secondary | ICD-10-CM | POA: Diagnosis not present

## 2018-04-05 DIAGNOSIS — E440 Moderate protein-calorie malnutrition: Secondary | ICD-10-CM | POA: Diagnosis not present

## 2018-04-05 DIAGNOSIS — R079 Chest pain, unspecified: Secondary | ICD-10-CM | POA: Diagnosis not present

## 2018-04-06 DIAGNOSIS — I4891 Unspecified atrial fibrillation: Secondary | ICD-10-CM | POA: Diagnosis not present

## 2018-04-06 DIAGNOSIS — R079 Chest pain, unspecified: Secondary | ICD-10-CM | POA: Diagnosis not present

## 2018-04-06 DIAGNOSIS — I10 Essential (primary) hypertension: Secondary | ICD-10-CM | POA: Diagnosis not present

## 2018-04-06 DIAGNOSIS — E440 Moderate protein-calorie malnutrition: Secondary | ICD-10-CM | POA: Diagnosis not present

## 2018-04-07 DIAGNOSIS — R079 Chest pain, unspecified: Secondary | ICD-10-CM | POA: Diagnosis not present

## 2018-04-07 DIAGNOSIS — E440 Moderate protein-calorie malnutrition: Secondary | ICD-10-CM | POA: Diagnosis not present

## 2018-04-07 DIAGNOSIS — I4891 Unspecified atrial fibrillation: Secondary | ICD-10-CM | POA: Diagnosis not present

## 2018-04-07 DIAGNOSIS — I10 Essential (primary) hypertension: Secondary | ICD-10-CM | POA: Diagnosis not present

## 2018-05-06 ENCOUNTER — Inpatient Hospital Stay (HOSPITAL_COMMUNITY)
Admission: AD | Admit: 2018-05-06 | Discharge: 2018-05-09 | DRG: 811 | Disposition: A | Discharge status: Home / Self Care | Payer: Medicare Other | Source: Other Acute Inpatient Hospital | Attending: Internal Medicine | Admitting: Internal Medicine

## 2018-05-06 DIAGNOSIS — E785 Hyperlipidemia, unspecified: Secondary | ICD-10-CM

## 2018-05-06 DIAGNOSIS — I482 Chronic atrial fibrillation, unspecified: Secondary | ICD-10-CM | POA: Diagnosis not present

## 2018-05-06 DIAGNOSIS — Z87891 Personal history of nicotine dependence: Secondary | ICD-10-CM

## 2018-05-06 DIAGNOSIS — D631 Anemia in chronic kidney disease: Secondary | ICD-10-CM | POA: Diagnosis not present

## 2018-05-06 DIAGNOSIS — Z7902 Long term (current) use of antithrombotics/antiplatelets: Secondary | ICD-10-CM

## 2018-05-06 DIAGNOSIS — Z90710 Acquired absence of both cervix and uterus: Secondary | ICD-10-CM

## 2018-05-06 DIAGNOSIS — F419 Anxiety disorder, unspecified: Secondary | ICD-10-CM | POA: Diagnosis not present

## 2018-05-06 DIAGNOSIS — E8889 Other specified metabolic disorders: Secondary | ICD-10-CM | POA: Diagnosis present

## 2018-05-06 DIAGNOSIS — Z66 Do not resuscitate: Secondary | ICD-10-CM | POA: Diagnosis present

## 2018-05-06 DIAGNOSIS — G9341 Metabolic encephalopathy: Secondary | ICD-10-CM | POA: Diagnosis present

## 2018-05-06 DIAGNOSIS — Z955 Presence of coronary angioplasty implant and graft: Secondary | ICD-10-CM | POA: Diagnosis not present

## 2018-05-06 DIAGNOSIS — F4481 Dissociative identity disorder: Secondary | ICD-10-CM | POA: Diagnosis present

## 2018-05-06 DIAGNOSIS — N186 End stage renal disease: Secondary | ICD-10-CM | POA: Diagnosis not present

## 2018-05-06 DIAGNOSIS — I120 Hypertensive chronic kidney disease with stage 5 chronic kidney disease or end stage renal disease: Secondary | ICD-10-CM | POA: Diagnosis present

## 2018-05-06 DIAGNOSIS — R1084 Generalized abdominal pain: Secondary | ICD-10-CM

## 2018-05-06 DIAGNOSIS — Z885 Allergy status to narcotic agent status: Secondary | ICD-10-CM

## 2018-05-06 DIAGNOSIS — F410 Panic disorder [episodic paroxysmal anxiety] without agoraphobia: Secondary | ICD-10-CM | POA: Diagnosis present

## 2018-05-06 DIAGNOSIS — D649 Anemia, unspecified: Secondary | ICD-10-CM | POA: Diagnosis not present

## 2018-05-06 DIAGNOSIS — Z992 Dependence on renal dialysis: Secondary | ICD-10-CM

## 2018-05-06 DIAGNOSIS — Z8673 Personal history of transient ischemic attack (TIA), and cerebral infarction without residual deficits: Secondary | ICD-10-CM | POA: Diagnosis not present

## 2018-05-06 DIAGNOSIS — I739 Peripheral vascular disease, unspecified: Secondary | ICD-10-CM | POA: Diagnosis present

## 2018-05-06 DIAGNOSIS — K922 Gastrointestinal hemorrhage, unspecified: Secondary | ICD-10-CM | POA: Diagnosis present

## 2018-05-06 DIAGNOSIS — Z79899 Other long term (current) drug therapy: Secondary | ICD-10-CM

## 2018-05-06 DIAGNOSIS — Z9115 Patient's noncompliance with renal dialysis: Secondary | ICD-10-CM

## 2018-05-06 DIAGNOSIS — I252 Old myocardial infarction: Secondary | ICD-10-CM | POA: Diagnosis not present

## 2018-05-06 DIAGNOSIS — N200 Calculus of kidney: Secondary | ICD-10-CM | POA: Diagnosis present

## 2018-05-06 DIAGNOSIS — Z9181 History of falling: Secondary | ICD-10-CM

## 2018-05-06 DIAGNOSIS — I2510 Atherosclerotic heart disease of native coronary artery without angina pectoris: Secondary | ICD-10-CM | POA: Diagnosis not present

## 2018-05-06 DIAGNOSIS — I959 Hypotension, unspecified: Secondary | ICD-10-CM | POA: Diagnosis present

## 2018-05-06 DIAGNOSIS — F1410 Cocaine abuse, uncomplicated: Secondary | ICD-10-CM | POA: Diagnosis present

## 2018-05-06 DIAGNOSIS — R54 Age-related physical debility: Secondary | ICD-10-CM | POA: Diagnosis present

## 2018-05-06 DIAGNOSIS — I639 Cerebral infarction, unspecified: Secondary | ICD-10-CM

## 2018-05-06 DIAGNOSIS — J449 Chronic obstructive pulmonary disease, unspecified: Secondary | ICD-10-CM | POA: Diagnosis not present

## 2018-05-06 DIAGNOSIS — K921 Melena: Secondary | ICD-10-CM | POA: Diagnosis present

## 2018-05-06 DIAGNOSIS — F1210 Cannabis abuse, uncomplicated: Secondary | ICD-10-CM | POA: Diagnosis present

## 2018-05-06 DIAGNOSIS — K219 Gastro-esophageal reflux disease without esophagitis: Secondary | ICD-10-CM | POA: Diagnosis present

## 2018-05-06 DIAGNOSIS — Z880 Allergy status to penicillin: Secondary | ICD-10-CM | POA: Diagnosis not present

## 2018-05-06 DIAGNOSIS — Z888 Allergy status to other drugs, medicaments and biological substances status: Secondary | ICD-10-CM

## 2018-05-06 DIAGNOSIS — F319 Bipolar disorder, unspecified: Secondary | ICD-10-CM | POA: Diagnosis not present

## 2018-05-06 DIAGNOSIS — D62 Acute posthemorrhagic anemia: Principal | ICD-10-CM | POA: Diagnosis present

## 2018-05-06 DIAGNOSIS — J4489 Other specified chronic obstructive pulmonary disease: Secondary | ICD-10-CM

## 2018-05-06 DIAGNOSIS — D500 Iron deficiency anemia secondary to blood loss (chronic): Secondary | ICD-10-CM

## 2018-05-06 DIAGNOSIS — K277 Chronic peptic ulcer, site unspecified, without hemorrhage or perforation: Secondary | ICD-10-CM | POA: Diagnosis present

## 2018-05-06 DIAGNOSIS — Z7982 Long term (current) use of aspirin: Secondary | ICD-10-CM

## 2018-05-06 DIAGNOSIS — R195 Other fecal abnormalities: Secondary | ICD-10-CM | POA: Diagnosis not present

## 2018-05-06 DIAGNOSIS — I10 Essential (primary) hypertension: Secondary | ICD-10-CM

## 2018-05-06 DIAGNOSIS — G8929 Other chronic pain: Secondary | ICD-10-CM | POA: Diagnosis present

## 2018-05-06 DIAGNOSIS — Z8719 Personal history of other diseases of the digestive system: Secondary | ICD-10-CM

## 2018-05-06 LAB — BASIC METABOLIC PANEL
Anion gap: 16 — ABNORMAL HIGH (ref 5–15)
BUN: 138 mg/dL — ABNORMAL HIGH (ref 8–23)
CO2: 18 mmol/L — ABNORMAL LOW (ref 22–32)
Calcium: 8.2 mg/dL — ABNORMAL LOW (ref 8.9–10.3)
Chloride: 106 mmol/L (ref 98–111)
Creatinine, Ser: 4.82 mg/dL — ABNORMAL HIGH (ref 0.44–1.00)
GFR calc Af Amer: 10 mL/min — ABNORMAL LOW (ref >60)
GFR calc non Af Amer: 9 mL/min — ABNORMAL LOW (ref >60)
Glucose, Bld: 90 mg/dL (ref 70–99)
Potassium: 4.3 mmol/L (ref 3.5–5.1)
Sodium: 140 mmol/L (ref 135–145)

## 2018-05-06 LAB — CBC
HCT: 18.3 % — ABNORMAL LOW (ref 36.0–46.0)
Hemoglobin: 5.6 g/dL — CL (ref 12.0–15.0)
MCH: 29.9 pg (ref 26.0–34.0)
MCHC: 30.6 g/dL (ref 30.0–36.0)
MCV: 97.9 fL (ref 80.0–100.0)
Platelets: 220 10*3/uL (ref 150–400)
RBC: 1.87 MIL/uL — ABNORMAL LOW (ref 3.87–5.11)
RDW: 20 % — ABNORMAL HIGH (ref 11.5–15.5)
WBC: 7.7 10*3/uL (ref 4.0–10.5)
nRBC: 0 % (ref 0.0–0.2)

## 2018-05-06 LAB — ABO/RH: ABO/RH(D): A POS

## 2018-05-06 LAB — PREPARE RBC (CROSSMATCH)

## 2018-05-06 LAB — GLUCOSE, CAPILLARY: Glucose-Capillary: 74 mg/dL (ref 70–99)

## 2018-05-06 MED ORDER — ONDANSETRON HCL 4 MG/2ML IJ SOLN
4.0000 mg | Freq: Four times a day (QID) | INTRAMUSCULAR | Status: DC | PRN
  Administered 2018-05-06 – 2018-05-08 (×2): 4 mg via INTRAVENOUS
  Filled 2018-05-06 (×2): qty 2

## 2018-05-06 MED ORDER — GABAPENTIN 300 MG PO CAPS
300.0000 mg | ORAL_CAPSULE | Freq: Every day | ORAL | Status: DC
  Administered 2018-05-07 – 2018-05-08 (×3): 300 mg via ORAL
  Filled 2018-05-06 (×3): qty 1

## 2018-05-06 MED ORDER — HYDROCODONE-ACETAMINOPHEN 5-325 MG PO TABS
1.0000 | ORAL_TABLET | Freq: Four times a day (QID) | ORAL | Status: DC | PRN
  Administered 2018-05-06 – 2018-05-07 (×2): 1 via ORAL
  Filled 2018-05-06 (×2): qty 1

## 2018-05-06 MED ORDER — METOPROLOL TARTRATE 5 MG/5ML IV SOLN
5.0000 mg | Freq: Four times a day (QID) | INTRAVENOUS | Status: DC
  Administered 2018-05-06: 5 mg via INTRAVENOUS
  Filled 2018-05-06 (×2): qty 5

## 2018-05-06 MED ORDER — CHLORHEXIDINE GLUCONATE CLOTH 2 % EX PADS
6.0000 | MEDICATED_PAD | Freq: Every day | CUTANEOUS | Status: DC
  Administered 2018-05-07 – 2018-05-09 (×2): 6 via TOPICAL

## 2018-05-06 MED ORDER — SODIUM CHLORIDE 0.9% IV SOLUTION: Freq: Once | INTRAVENOUS | Status: DC

## 2018-05-06 MED ORDER — LABETALOL HCL 200 MG PO TABS
200.0000 mg | ORAL_TABLET | Freq: Two times a day (BID) | ORAL | Status: DC
  Administered 2018-05-07 – 2018-05-09 (×5): 200 mg via ORAL
  Filled 2018-05-06 (×5): qty 1

## 2018-05-06 MED ORDER — OXYCODONE HCL 5 MG PO TABS
5.0000 mg | ORAL_TABLET | Freq: Once | ORAL | Status: AC
  Administered 2018-05-06: 5 mg via ORAL
  Filled 2018-05-06: qty 1

## 2018-05-06 MED ORDER — DICYCLOMINE HCL 10 MG PO CAPS
10.0000 mg | ORAL_CAPSULE | Freq: Three times a day (TID) | ORAL | Status: DC
  Administered 2018-05-07 – 2018-05-09 (×8): 10 mg via ORAL
  Filled 2018-05-06 (×14): qty 1

## 2018-05-06 MED ORDER — DARBEPOETIN ALFA 200 MCG/0.4ML IJ SOSY
200.0000 ug | PREFILLED_SYRINGE | INTRAMUSCULAR | Status: DC
  Filled 2018-05-06: qty 0.4

## 2018-05-06 MED ORDER — DILTIAZEM HCL 25 MG/5ML IV SOLN
10.0000 mg | Freq: Once | INTRAVENOUS | Status: AC
  Administered 2018-05-06: 10 mg via INTRAVENOUS
  Filled 2018-05-06: qty 5

## 2018-05-06 MED ORDER — LORAZEPAM 2 MG/ML IJ SOLN
0.5000 mg | Freq: Once | INTRAMUSCULAR | Status: AC
  Administered 2018-05-06: 0.5 mg via INTRAVENOUS
  Filled 2018-05-06: qty 1

## 2018-05-06 MED ORDER — RENA-VITE PO TABS
1.0000 | ORAL_TABLET | Freq: Every day | ORAL | Status: DC
  Administered 2018-05-07 – 2018-05-08 (×3): 1 via ORAL
  Filled 2018-05-06 (×3): qty 1

## 2018-05-06 MED ORDER — PANTOPRAZOLE SODIUM 40 MG IV SOLR
40.0000 mg | Freq: Two times a day (BID) | INTRAVENOUS | Status: DC
  Administered 2018-05-07: 40 mg via INTRAVENOUS
  Filled 2018-05-06: qty 40

## 2018-05-06 MED ORDER — ISOSORBIDE MONONITRATE ER 30 MG PO TB24: 30.0000 mg | ORAL_TABLET | Freq: Every day | ORAL | Status: DC

## 2018-05-06 MED ORDER — ALLOPURINOL 100 MG PO TABS
100.0000 mg | ORAL_TABLET | Freq: Every day | ORAL | Status: DC
  Administered 2018-05-07 – 2018-05-09 (×3): 100 mg via ORAL
  Filled 2018-05-06 (×3): qty 1

## 2018-05-06 MED ORDER — AMLODIPINE BESYLATE 10 MG PO TABS
10.0000 mg | ORAL_TABLET | Freq: Every day | ORAL | Status: DC
  Administered 2018-05-07: 10 mg via ORAL
  Filled 2018-05-06: qty 1

## 2018-05-06 MED ORDER — SEVELAMER CARBONATE 800 MG PO TABS
2400.0000 mg | ORAL_TABLET | Freq: Three times a day (TID) | ORAL | Status: DC
  Administered 2018-05-07 – 2018-05-09 (×5): 2400 mg via ORAL
  Filled 2018-05-06 (×6): qty 3

## 2018-05-06 MED ORDER — IPRATROPIUM-ALBUTEROL 0.5-2.5 (3) MG/3ML IN SOLN: 3.0000 mL | Freq: Four times a day (QID) | RESPIRATORY_TRACT | Status: DC | PRN

## 2018-05-06 MED ORDER — PRAVASTATIN SODIUM 40 MG PO TABS
80.0000 mg | ORAL_TABLET | Freq: Every day | ORAL | Status: DC
  Administered 2018-05-07 – 2018-05-08 (×3): 80 mg via ORAL
  Filled 2018-05-06 (×3): qty 2

## 2018-05-06 MED ORDER — CALCITRIOL 0.25 MCG PO CAPS
0.5000 ug | ORAL_CAPSULE | ORAL | Status: DC
  Administered 2018-05-08: 15:00:00 0.5 ug via ORAL

## 2018-05-06 NOTE — Consult Note (Signed)
Reason for Consult: To manage dialysis and dialysis related needs Referring Physician: Bethaney Oshana is an 68 y.o. female with PMhx significant for anxiety/depression/bipolar d/o with ETOH/marijuana use, HTN, CAD, COPD with continued tobacco use, cerebrovascular disease and A fib felt to be fall risk so no systemic anticoagulation but on plavix and ASA.  She is s/p a hospitalization in March at which time hemodialysis was initiated on 3/20- she had an AVF that was not usable so [facility] was placed and she was set up at [facility] on TTS- went there on 3/26, 3/28 and 3/31, then on 4/4 and she has not been back.  From the records apparently she presented to [facility] today with abdominal pain, dark stools  and found to have a hgb of 5.9- also maybe some history of her passing out on the way to HD.  Of note it was 7.9 and decreasing on 3/25 in the hospital- was 7.4 at HD on 4/4.  Was sent here for GI eval and management including dialysis.  Right now she is writhing in pain- seems lower abdominal - says she feels like she needs to have a BM   Dialyzes at [place]. TTS, 4 hours  HD [facility] 2/2.25, Dialyzer 180, Heparin yes- 1600 units. Access TDC. mircera 50 - ordered, maybe not given yet, calcitriiol 0.5 q tx and iron course.  Last labs hgb 7.4 on 4/4, K of 4.3, phos of 5.9, pth 628,   No past medical history on file.    No family history on file. unknown  Social History:  has no history on file for tobacco, alcohol, and drug.  Reportedly uses ETOH and marijuana due to her psych issues   Allergies: Allergies not on file flexeril, pcn, codeine, morphine   Medications: I have reviewed the patient's current medications.   Results for orders placed or performed during the hospital encounter of 05/06/18 (from the past 48 hour(s))  Glucose, capillary     Status: None   Collection Time: 05/06/18  3:46 PM  Result Value Ref [place]   Glucose-Capillary 74 70 - 99 mg/dL  ABO/Rh      Status: None   Collection Time: 05/06/18  4:22 PM  Result Value Ref [place]   ABO/RH(D)      A POS Performed at [facility] [address], [place], [place] [address]   Basic metabolic panel     Status: Abnormal   Collection Time: 05/06/18  4:55 PM  Result Value Ref [place]   Sodium 140 135 - 145 mmol/L   Potassium 4.3 3.5 - 5.1 mmol/L   Chloride 106 98 - 111 mmol/L   CO2 18 (L) 22 - 32 mmol/L   Glucose, Bld 90 70 - 99 mg/dL   BUN 138 (H) 8 - 23 mg/dL   Creatinine, Ser 4.82 (H) 0.44 - 1.00 mg/dL   Calcium 8.2 (L) 8.9 - 10.3 mg/dL   GFR calc non Af Amer 9 (L) >60 mL/min   GFR calc Af Amer 10 (L) >60 mL/min   Anion gap 16 (H) 5 - 15    Comment: Performed at [facility] [address] [place] [address]  CBC     Status: Abnormal   Collection Time: 05/06/18  4:55 PM  Result Value Ref [place]   WBC 7.7 4.0 - 10.5 K/uL   RBC 1.87 (L) 3.87 - 5.11 MIL/uL   Hemoglobin 5.6 (LL) 12.0 - 15.0 g/dL  Comment: REPEATED TO VERIFY THIS CRITICAL RESULT HAS VERIFIED AND BEEN CALLED TO N. GAUNTLETT,RN BY ZELDA BEECH ON 04 09 2020 AT 5537, AND HAS BEEN READ BACK.     HCT 18.3 (L) 36.0 - 46.0 %   MCV 97.9 80.0 - 100.0 fL   MCH 29.9 26.0 - 34.0 pg   MCHC 30.6 30.0 - 36.0 g/dL   RDW 20.0 (H) 11.5 - 15.5 %   Platelets 220 150 - 400 K/uL   nRBC 0.0 0.0 - 0.2 %    Comment: Performed at [facility] [address], [place], [place] [address]  Type and screen [place]     Status: None   Collection Time: 05/06/18  4:55 PM  Result Value Ref [place]   ABO/RH(D) A POS    Antibody Screen NEG    Sample Expiration      05/09/2018 Performed at [facility], [facility] [address], [place], [place] [address]     No results found.  ROS: positive for lower abdominal pain "im sick"  Denies Nausea, SOB, other ROS are negative Blood pressure (!) 143/68, pulse 93, temperature 98.2 F (36.8 C), temperature source Oral, resp. rate 18, SpO2 99 %. 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  Has been on ASA and plavix.  Baseline hgb is not great, now lower.  GI is involved, thinking possible procedure tomorrow afternoon  2 ESRD: new ESRD- has only had a few OP treatments via [place].  Apparently AVF was found to be non functional.  Told me that the dialysis has been going fine, is open to getting treatment here.  Given her high BUN would like to do sooner rather than later but suspect will be in middle of night or early tomorrow AM  3 Hypertension: unclear what BP meds she is on - is tachy and possibly dry.  Does not need fluid removal with HD  4. Anemia of ESRD: hgb was 7.9 recently- has only received iron, no ESA, I will order max ESA.  Also due to get blood, OK to get one unit pre dialysis then can give second when she gets dialysis  5. Metabolic Bone Disease: will continue home calcitriol.  Was on renvela as OP- will hold for now as seems to be having GI issues and has history of SBO so renvela may not be the binder for her    Louis Meckel 05/06/2018, 5:27 PM

## 2018-05-06 NOTE — Progress Notes (Signed)
Paged Bodenheimer, NP again to make him aware of afib rate 120-140's and BP 131/92.

## 2018-05-06 NOTE — Consult Note (Signed)
[facility] Consult: 4:48 PM 05/06/2018  LOS: 1 day    Referring Provider: Dr Maylene Roes  Primary Care Physician:  Clint Bolder [name], PA-C Primary Gastroenterologist:  unassigned .   Dr. Lyda Jester in [place]    Reason for Consultation:  Melena, anemia   HPI: Christina [name] is a 68 y.o. female.  Hx ESRD.  On hemodialysis.   Atrial fibrillation.  Chronic Plavix.  Peripheral artery disease.  S/p carotid endarterectomy.  Hypertension.  CAD, end STEMI, previous cardiac stents.  Carotid artery stenosis.  Smoker, quit 1 month ago.  Anxiety, multiple personality disorder, panic disorder..  . 08/2017 colonoscopy normal.  Planned repeat study 5 years due to previous history colon polyps.  Peptic ulcer disease with GI bleed ~ 2009 08/2017 EGD.  No results found.   She gets her care at [facility] and in [place] obstruction in 12/2017, managed medically.  Recurrent in 01/2018 and underwent ex lap, LOA, SB resection Dr Corena Pilgrim at [facility].  Having abd pain since then.  Not prompted by po.  Some nausea, not much vomiting.   Admission to [facility], 04/20/18 for ~ 1 week. She is poor historian but says she presented with abd pain. List of problems was resp failure, AKI.  Abdominal films showed nonobstructive bowel gas pattern.  Sent home on PO iron and stool black since then, loose stools x 3 days.  No visible blood.  Started HD late last week, after progressive kidney failure for 4 years.  Had a hard time tolerating the dialysis, she cannot stay still and she feels very weak during sessions.  Abdominal pain continues.       PRBC transfusion on 3/19 for Hgb 6.3, MCV 88.  Hgb 7.9 on 04/21/2018.  Hgb 5.9.  MCV 98 BUN/creat 36/4.4 Lipase 402.  T bili 0.3.  Alk phos 49.  AST/ALT 69/49.    FOBT +  CT of the abdomen and pelvis without contrast showed remarkable pancreas and liver.  Adrenal nodules.  Previous hysterectomy.  No bowel obstruction.  Nephrolithiasis. Started on PPI gtt at [place].    Says she quit drinking all alcoholic beverages around 10 years ago.      Prior to Admission medications   Not on File  Plavix. Have a statin.  Combivent inhaler.  Sublingual nitroglycerin.  DuoNeb nebulizer.  Artificial tears drops.  Dicyclomine as needed.  Aspirin 81 mg.  Fenofibrate.  Labetalol.  Amlodipine.  Senokot S PRN.  Lasix.  Gabapentin.  Propanolol as needed headache.  Percocet 5/325 as needed.  Allopurinol.  Isosorbide mononitrate.  Chantix.  Pepcid 10 mg daily.  Sodium bicarb.   Scheduled Meds:  Infusions:  PRN Meds:    Allergies as of 05/06/2018  . (Not on File)  Morphine.  Codeine.  Penicillin G.  Candesartan, Cilexatil.  Cyclobenzaprine.  Ibuprofen.  No family history on file.  Denies family history of colon cancer, ulcer disease, anemia.  Social History   Socioeconomic History  . Marital status: Single    Spouse name: Not on file  .  Number of children: Not on file  . Years of education: Not on file  . Highest education level: Not on file  Occupational History  . Not on file  Social Needs  . Financial resource strain: Not on file  . Food insecurity:    Worry: Not on file    Inability: Not on file  . Transportation needs:    Medical: Not on file    Non-medical: Not on file  Tobacco Use  . Smoking status: Not on file  Substance and Sexual Activity  . Alcohol use: Not on file  . Drug use: Not on file  . Sexual activity: Not on file  Lifestyle  . Physical activity:    Days per week: Not on file    Minutes per session: Not on file  . Stress: Not on file  Relationships  . Social connections:    Talks on phone: Not on file    Gets together: Not on file    Attends religious service: Not on file    Active member of club or organization:  Not on file    Attends meetings of clubs or organizations: Not on file    Relationship status: Not on file  . Intimate partner violence:    Fear of current or ex partner: Not on file    Emotionally abused: Not on file    Physically abused: Not on file    Forced sexual activity: Not on file  Other Topics Concern  . Not on file  Social History Narrative  . Not on file    REVIEW OF SYSTEMS: Constitutional: Feels bad. ENT:  No nose bleeds Pulm: No new shortness of breath.  No cough. CV:  No palpitations, no LE edema.  Chest pain. GU:  No hematuria, no frequency GI: HPI. Heme: No lower excessive bleeding or bruising. Transfusions: See HPI. Neuro:  No headaches, no peripheral tingling or numbness Derm:  No itching, no rash or sores.  Endocrine:  No sweats or chills.  No polyuria or dysuria Immunization: Not queried. Travel:  None beyond local counties in last few months.    PHYSICAL EXAM: Vital signs in last 24 hours: Vitals:   05/06/18 1549  BP: (!) 143/68  Pulse: 93  Resp: 18  Temp: 98.2 F (36.8 C)  SpO2: 99%   Wt Readings from Last 3 Encounters:  No data found for Wt    General: In, malnourished, chronically ill, anxious, uncomfortable WF. Head: No facial asymmetry or swelling.  No signs of head trauma. Eyes: Scleral icterus.  No conjunctival pallor. Ears: Not hard of hearing. Nose: No congestion or discharge. Mouth: Oropharynx moist, pink, clear. Neck: No JVD, no masses, no thyromegaly. Lungs: [place] but clear.  No labored breathing or cough.  Voice is raspy consistent with hx smoking Heart: RRR. Abdomen: Soft.  Not distended.  Mild to moderate tenderness diffusely.  No guarding or rebound.  Bowel sounds active.  No HSM, masses, bruits, hernias.  Well-healed midline scar from previous surgeries.   Rectal: Nothing in the rectal vault, no blood or stool.  No masses. Musc/Skeltl: No joint swelling, redness or gross deformity. Extremities: CCE. Neurologic: Alert.   Oriented x3. Skin: No rash, no sores, no telangiectasia. Nodes: No cervical adenopathy. Psych: Anxious.  Pressured speech.  Cooperative.  Intake/Output from previous day: No intake/output data recorded. Intake/Output this shift: No intake/output data recorded.  LAB RESULTS: No results for input(s): WBC, HGB, HCT, PLT in the last 72 hours. BMET Lab Results  Component Value Date   NA 134 (L) 04/03/2008   NA 139 04/01/2008   NA 139 04/01/2008   K 4.1 04/03/2008   K 3.9 04/01/2008   K 3.8 04/01/2008   CL 100 04/03/2008   CL 104 04/01/2008   CL 105 04/01/2008   CO2 28 04/03/2008   CO2 26 04/01/2008   CO2 25 04/01/2008   GLUCOSE 100 (H) 04/03/2008   GLUCOSE 108 (H) 04/01/2008   GLUCOSE 113 (H) 04/01/2008   BUN 21 04/03/2008   BUN 16 04/01/2008   BUN 19 04/01/2008   CREATININE 1.53 (H) 04/03/2008   CREATININE 1.02 DELTA CHECK NOTED 04/01/2008   CREATININE 1.55 (H) 04/01/2008   CALCIUM 9.0 04/03/2008   CALCIUM 9.3 04/01/2008   CALCIUM 9.3 04/01/2008   LFT No results for input(s): PROT, ALBUMIN, AST, ALT, ALKPHOS, BILITOT, BILIDIR, IBILI in the last 72 hours. PT/INR Lab Results  Component Value Date   INR 0.9 04/01/2008   Hepatitis Panel No results for input(s): HEPBSAG, HCVAB, HEPAIGM, HEPBIGM in the last 72 hours. C-Diff No components found for: CDIFF Lipase  No results found for: LIPASE  Drugs of Abuse  No results found for: LABOPIA, COCAINSCRNUR, LABBENZ, AMPHETMU, THCU, LABBARB   RADIOLOGY STUDIES: No results found.  IMPRESSION:   *   Recurrent acute on chronic anemia.  FOBT positive.  Stool has been black since starting on iron in the last couple of weeks. Required blood transfusion and hospitalization at [facility] when her Hgb was 6.3. Suspect anemia is multifactorial with significant contribution of her renal failure. Remote history of bleeding peptic ulcer disease, her only acid controlling medication is low-dose Pepcid.  *     Chronic  abdominal pain eating back to surgery for bowel obstruction in January 2020.  Recent KUB for evaluation of pain, at [facility], showed no signs of bowel obstruction and no significant pathology. Her lipase is elevated, on noncontrast CT today at [facility] the pancreas appears normal.  She denies alcohol consumption.  *     Anxiety.  *     ESRD.  Started hemodialysis late last week.    PLAN:     *   EGD?  I will get her a slot for EGD tomorrow, MD to give final approval for this.    Azucena Freed  05/06/2018, 4:48 PM Phone [phone]

## 2018-05-06 NOTE — Progress Notes (Signed)
Paged Bodenheimer, NP via amion to make him aware of patient's complaint of pain.  Waiting for call back or order.

## 2018-05-06 NOTE — H&P (Signed)
History and Physical    Christina [name] JIR:[id number] DOB: Sep 30, 1950 DOA: 05/06/2018  PCP: Christina Schwalbe, PA-C   Patient coming from: Home via [facility] emergency department  I have personally briefly reviewed patient's old medical records in [place]  Chief Complaint: Weakness  HPI: Christina [name] is a 68 y.o. female with medical history significant of ESRD on dialysis via right chest wall dialysis catheter Tuesday Thursday Saturday, CVA without deficits,CAD - STEMI 2010 status post PCI and stenting (unknown vessel), carotid endarterectomy, hypertension, hyperlipidemia, attempted/failed left forearm fistula, COPD on room air, GERD, asthma, questionable history of heart failure, depression, anxiety, bipolar disorder who presents to the ED at [facility] for what sounds like worsening weakness over the past 24 hours and a set of lightheadedness/dizziness/presyncope while attempting to ambulate to her car for dialysis this morning.  She is well-known to that facility, with more than 5 admissions in the past 2 months for various complaints of diffuse pain, small bowel obstruction, pain, back pain, abdominal pain.  Patient indicates that she had 1 large melanotic/dark maroon stool this morning and has been complaining of generalized abdominal pain "since surgery in January."  Patient indicates she had a small bowel obstruction requiring small bowel resection with Dr. My[facility] in January of 2020.  She otherwise declines nausea, vomiting, diarrhea, constipation, headache, fevers, chills, chest pain, myalgias, shortness of breath.  Echo 04/04/2018: CONCLUSIONS ----------- 1. Overall left ventricular systolic function is normal with, an EF between 60 - 65 %. 2. Pseudonormal LV filling pattern, consistent with elevated LA pressure. 3. There is no evidence of aortic regurgitation. 4. There is no evidence of aortic stenosis. 5. Moderate mitral  regurgitation is present. 6. Moderate tricuspid regurgitation present.   ED Course: In the ED at [facility] patient had CT abdomen pelvis noted to be unremarkable, hemoglobin as low as 5.9.  Vitals otherwise stable heart rate 92 blood pressure 140/70 satting 100% on room air and afebrile. Patient was started on IV PPI and was signed out that patient is to receive 2 units packed red cells however further discussion with patient here indicates she did not receive blood.  Repeat labs currently pending to verify patient's current hemaglobin status. Per [name] EMR patient did receive one unit PRBC today.  Chart review [name]: anion gap 19, BUN 136, creatinine 4.4, glucose 228, serum corrected 9.1, BNP 20,800, BUN 2.8, lipase 402, white count 6.6 hemoglobin 5.9, 244.  She received 1 dose of fentanyl 75 mcg, IV Tylenol 625 mg x 1, IV pantoprazole 8 x1  Review of Systems: As per HPI otherwise 10 point review of systems negative.  Social history: Patient declines any alcohol use, former tobacco use/abuse states that she quit approximately 1 month prior this hospital stay  Family history: Reviewed, not pertinent to current admission .   Prior to Admission medications   Not on File    Physical Exam: Vitals:   05/06/18 1549  BP: (!) 143/68  Pulse: 93  Resp: 18  Temp: 98.2 F (36.8 C)  TempSrc: Oral  SpO2: 99%    Constitutional: NAD, calm, comfortable Vitals:   05/06/18 1549  BP: (!) 143/68  Pulse: 93  Resp: 18  Temp: 98.2 F (36.8 C)  TempSrc: Oral  SpO2: 99%   General: Currently sitting up in bed stating she has pain "everywhere" -does not appear to be in overt distress HEENT:  Normocephalic atraumatic.  Sclerae nonicteric, noninjected.  Extraocular movements intact bilaterally. Neck:  Without  mass or deformity.  Trachea is midline. Lungs:  Clear to auscultate bilaterally without rhonchi, wheeze, or rales. Heart:  Regular rate and rhythm.  Without murmurs, rubs, or gallops.    Abdomen:  Soft, nontender, nondistended.  Without guarding or rebound. Extremities: Without cyanosis, clubbing, edema, or obvious deformity. Vascular:  Dorsalis pedis and posterior tibial pulses palpable bilaterally.  Right chest wall dialysis catheter noted without erythema Skin:  Warm and dry, no erythema, no ulcerations.  Labs on Admission: I have personally reviewed following labs and imaging studies  CBC: Recent Labs  Lab 05/06/18 1655  WBC 7.7  HGB 5.6*  HCT 18.3*  MCV 97.9  PLT 381   Basic Metabolic Panel: No results for input(s): NA, K, CL, CO2, GLUCOSE, BUN, CREATININE, CALCIUM, MG, PHOS in the last 168 hours. GFR: CrCl cannot be calculated (Patient's most recent lab result is older than the maximum 21 days allowed.). Liver Function Tests: No results for input(s): AST, ALT, ALKPHOS, BILITOT, PROT, ALBUMIN in the last 168 hours. No results for input(s): LIPASE, AMYLASE in the last 168 hours. No results for input(s): AMMONIA in the last 168 hours. Coagulation Profile: No results for input(s): INR, PROTIME in the last 168 hours. Cardiac Enzymes: No results for input(s): CKTOTAL, CKMB, CKMBINDEX, TROPONINI in the last 168 hours. BNP (last 3 results) No results for input(s): PROBNP in the last 8760 hours. HbA1C: No results for input(s): HGBA1C in the last 72 hours. CBG: Recent Labs  Lab 05/06/18 1546  GLUCAP 74   Lipid Profile: No results for input(s): CHOL, HDL, LDLCALC, TRIG, CHOLHDL, LDLDIRECT in the last 72 hours. Thyroid Function Tests: No results for input(s): TSH, T4TOTAL, FREET4, T3FREE, THYROIDAB in the last 72 hours. Anemia Panel: No results for input(s): VITAMINB12, FOLATE, FERRITIN, TIBC, IRON, RETICCTPCT in the last 72 hours. Urine analysis: No results found for: COLORURINE, APPEARANCEUR, LABSPEC, PHURINE, GLUCOSEU, HGBUR, BILIRUBINUR, KETONESUR, PROTEINUR, UROBILINOGEN, NITRITE, LEUKOCYTESUR  Radiological Exams on Admission: No results  found.  Assessment/Plan Principal Problem:   Acute GI bleeding Active Problems:   ESRD (end stage renal disease) (HCC)   Anxiety   Bipolar affective (HCC)   CAD (coronary artery disease), native coronary artery   COPD with chronic bronchitis (HCC)   CVA (cerebral vascular accident) ([place])   Essential hypertension   Hyperlipidemia   Acute GI Bleed, concurrent acute blood loss anemia on chronic anemia of chronic disease secondary to ESRD, asymptomatic - Awaiting repeat CBC here -repeat hemoglobin 5.6 admission, 1 unit PRBC pending as below -recheck after transfusion - Patient denies having received blood at [facility] signout indicated 2 units PRBC was given - per chart review only 1 unit was given - GI following, appreciate insight and recommendations, per note by PA - possible EGD tomorrow morning 05/07/2018 -Transfuse 1 additional unit PRBC per discussion with nephrology, if patient requires more blood this will be given during dialysis either later tonight or tomorrow per the schedule as below -Continue IV pantoprazole twice daily -Need to hold aspirin, Plavix, DVT prophylaxis as below  ESRD Dialysis TU,TH,SAT -Discussed with Dr. Moshe Cipro -Unclear if patient will be able to have dialysis today given their schedule -Transfuse 1 unit PRBC as above, hold second unit for dialysis per our discussion  CAD status post stenting, unknown vessel 2010 -Need to hold patient's Plavix and aspirin in the setting of above -Resume home blocker core measures, patient likely not able to tolerate ACE/arb in ESRD  Anxiety/depression/bipolar disorder -Continue home medications  COPD, not in acute  exacerbation, not hypoxic -Continue home inhaled medications, no complaints currently denies shortness of breath, not hypoxic  History of CVA without deficits -As above continue to hold aspirin and Plavix, resume home statin  Hypertension, essential -Resume home medications as  above  Hyperlipidemia -Continue home statin  DVT prophylaxis: Holding chemical prophylaxis as above, SCDs, early ambulation Code Status: Full Family Communication: None present Disposition Plan: Likely discharge home in 24 hours pending clinical course and endoscopic findings  Consults called: Nephrology, [facility]; GI (consulted prior to admission) Admission status: Patient, likely disposed home in the next 12 to 24 hours pending clinical course   [place] [facility]  If 7PM-7AM, please contact night-coverage [email or web address] Password [facility]  05/06/2018, 5:48 PM

## 2018-05-07 ENCOUNTER — Encounter (HOSPITAL_COMMUNITY)
Admission: AD | Disposition: A | Discharge status: Home / Self Care | Payer: Self-pay | Source: Other Acute Inpatient Hospital | Attending: Internal Medicine

## 2018-05-07 DIAGNOSIS — Z888 Allergy status to other drugs, medicaments and biological substances status: Secondary | ICD-10-CM | POA: Diagnosis not present

## 2018-05-07 DIAGNOSIS — Z8673 Personal history of transient ischemic attack (TIA), and cerebral infarction without residual deficits: Secondary | ICD-10-CM | POA: Diagnosis not present

## 2018-05-07 DIAGNOSIS — Z66 Do not resuscitate: Secondary | ICD-10-CM | POA: Diagnosis present

## 2018-05-07 DIAGNOSIS — Z880 Allergy status to penicillin: Secondary | ICD-10-CM | POA: Diagnosis not present

## 2018-05-07 DIAGNOSIS — Z992 Dependence on renal dialysis: Secondary | ICD-10-CM | POA: Diagnosis not present

## 2018-05-07 DIAGNOSIS — K922 Gastrointestinal hemorrhage, unspecified: Secondary | ICD-10-CM | POA: Diagnosis present

## 2018-05-07 DIAGNOSIS — R195 Other fecal abnormalities: Secondary | ICD-10-CM | POA: Diagnosis not present

## 2018-05-07 DIAGNOSIS — I482 Chronic atrial fibrillation, unspecified: Secondary | ICD-10-CM | POA: Diagnosis present

## 2018-05-07 DIAGNOSIS — D500 Iron deficiency anemia secondary to blood loss (chronic): Secondary | ICD-10-CM | POA: Diagnosis not present

## 2018-05-07 DIAGNOSIS — Z7902 Long term (current) use of antithrombotics/antiplatelets: Secondary | ICD-10-CM | POA: Diagnosis not present

## 2018-05-07 DIAGNOSIS — F319 Bipolar disorder, unspecified: Secondary | ICD-10-CM | POA: Diagnosis present

## 2018-05-07 DIAGNOSIS — G9341 Metabolic encephalopathy: Secondary | ICD-10-CM | POA: Diagnosis present

## 2018-05-07 DIAGNOSIS — Z955 Presence of coronary angioplasty implant and graft: Secondary | ICD-10-CM | POA: Diagnosis not present

## 2018-05-07 DIAGNOSIS — Z885 Allergy status to narcotic agent status: Secondary | ICD-10-CM | POA: Diagnosis not present

## 2018-05-07 DIAGNOSIS — F419 Anxiety disorder, unspecified: Secondary | ICD-10-CM | POA: Diagnosis present

## 2018-05-07 DIAGNOSIS — N186 End stage renal disease: Secondary | ICD-10-CM | POA: Diagnosis present

## 2018-05-07 DIAGNOSIS — Z79899 Other long term (current) drug therapy: Secondary | ICD-10-CM | POA: Diagnosis not present

## 2018-05-07 DIAGNOSIS — I2510 Atherosclerotic heart disease of native coronary artery without angina pectoris: Secondary | ICD-10-CM | POA: Diagnosis present

## 2018-05-07 DIAGNOSIS — E785 Hyperlipidemia, unspecified: Secondary | ICD-10-CM | POA: Diagnosis present

## 2018-05-07 DIAGNOSIS — I252 Old myocardial infarction: Secondary | ICD-10-CM | POA: Diagnosis not present

## 2018-05-07 DIAGNOSIS — Z7982 Long term (current) use of aspirin: Secondary | ICD-10-CM | POA: Diagnosis not present

## 2018-05-07 DIAGNOSIS — D62 Acute posthemorrhagic anemia: Secondary | ICD-10-CM | POA: Diagnosis present

## 2018-05-07 DIAGNOSIS — D631 Anemia in chronic kidney disease: Secondary | ICD-10-CM | POA: Diagnosis present

## 2018-05-07 DIAGNOSIS — K921 Melena: Secondary | ICD-10-CM | POA: Diagnosis present

## 2018-05-07 DIAGNOSIS — K219 Gastro-esophageal reflux disease without esophagitis: Secondary | ICD-10-CM | POA: Diagnosis present

## 2018-05-07 DIAGNOSIS — J449 Chronic obstructive pulmonary disease, unspecified: Secondary | ICD-10-CM | POA: Diagnosis present

## 2018-05-07 DIAGNOSIS — I120 Hypertensive chronic kidney disease with stage 5 chronic kidney disease or end stage renal disease: Secondary | ICD-10-CM | POA: Diagnosis present

## 2018-05-07 LAB — CBC
HCT: 18.2 % — ABNORMAL LOW (ref 36.0–46.0)
Hemoglobin: 6.2 g/dL — CL (ref 12.0–15.0)
MCH: 32.3 pg (ref 26.0–34.0)
MCHC: 34.1 g/dL (ref 30.0–36.0)
MCV: 94.8 fL (ref 80.0–100.0)
Platelets: 174 10*3/uL (ref 150–400)
RBC: 1.92 MIL/uL — ABNORMAL LOW (ref 3.87–5.11)
RDW: 18.6 % — ABNORMAL HIGH (ref 11.5–15.5)
WBC: 10.5 10*3/uL (ref 4.0–10.5)
nRBC: 0 % (ref 0.0–0.2)

## 2018-05-07 LAB — HEMOGLOBIN AND HEMATOCRIT, BLOOD
HCT: 18.4 % — ABNORMAL LOW (ref 36.0–46.0)
HCT: 22.7 % — ABNORMAL LOW (ref 36.0–46.0)
Hemoglobin: 6 g/dL — CL (ref 12.0–15.0)
Hemoglobin: 7.6 g/dL — ABNORMAL LOW (ref 12.0–15.0)

## 2018-05-07 LAB — HIV ANTIBODY (ROUTINE TESTING W REFLEX): HIV Screen 4th Generation wRfx: NONREACTIVE

## 2018-05-07 LAB — PREPARE RBC (CROSSMATCH)

## 2018-05-07 SURGERY — ESOPHAGOGASTRODUODENOSCOPY (EGD) WITH PROPOFOL
Anesthesia: Monitor Anesthesia Care

## 2018-05-07 MED ORDER — PANTOPRAZOLE SODIUM 40 MG IV SOLR: 40.0000 mg | Freq: Two times a day (BID) | INTRAVENOUS | Status: DC

## 2018-05-07 MED ORDER — CHLORHEXIDINE GLUCONATE CLOTH 2 % EX PADS
6.0000 | MEDICATED_PAD | Freq: Every day | CUTANEOUS | Status: DC
  Administered 2018-05-08 – 2018-05-09 (×2): 6 via TOPICAL

## 2018-05-07 MED ORDER — SODIUM CHLORIDE 0.9 % IV SOLN: 100.0000 mL | INTRAVENOUS | Status: DC | PRN

## 2018-05-07 MED ORDER — SODIUM CHLORIDE 0.9 % IV SOLN
80.0000 mg | Freq: Once | INTRAVENOUS | Status: AC
  Filled 2018-05-07: qty 80

## 2018-05-07 MED ORDER — LIDOCAINE HCL (PF) 1 % IJ SOLN: 5.0000 mL | INTRAMUSCULAR | Status: DC | PRN

## 2018-05-07 MED ORDER — LIDOCAINE-PRILOCAINE 2.5-2.5 % EX CREA: 1.0000 "application " | TOPICAL_CREAM | CUTANEOUS | Status: DC | PRN

## 2018-05-07 MED ORDER — ALTEPLASE 2 MG IJ SOLR: 2.0000 mg | Freq: Once | INTRAMUSCULAR | Status: DC | PRN

## 2018-05-07 MED ORDER — SODIUM CHLORIDE 0.9 % IV SOLN
8.0000 mg/h | INTRAVENOUS | Status: DC
  Administered 2018-05-07: 8 mg/h via INTRAVENOUS
  Filled 2018-05-07: qty 80

## 2018-05-07 MED ORDER — HEPARIN SODIUM (PORCINE) 1000 UNIT/ML DIALYSIS: 1000.0000 [IU] | INTRAMUSCULAR | Status: DC | PRN

## 2018-05-07 MED ORDER — SODIUM CHLORIDE 0.9% IV SOLUTION: Freq: Once | INTRAVENOUS | Status: DC

## 2018-05-07 MED ORDER — DILTIAZEM HCL 25 MG/5ML IV SOLN
10.0000 mg | Freq: Once | INTRAVENOUS | Status: AC
  Administered 2018-05-07: 10 mg via INTRAVENOUS
  Filled 2018-05-07: qty 5

## 2018-05-07 MED ORDER — LORAZEPAM 2 MG/ML IJ SOLN
1.0000 mg | Freq: Four times a day (QID) | INTRAMUSCULAR | Status: DC | PRN
  Administered 2018-05-07 (×2): 1 mg via INTRAVENOUS
  Filled 2018-05-07 (×2): qty 1

## 2018-05-07 MED ORDER — DILTIAZEM HCL-DEXTROSE 100-5 MG/100ML-% IV SOLN (PREMIX)
5.0000 mg/h | INTRAVENOUS | Status: DC
  Administered 2018-05-07 (×2): 5 mg/h via INTRAVENOUS
  Filled 2018-05-07 (×2): qty 100

## 2018-05-07 MED ORDER — PENTAFLUOROPROP-TETRAFLUOROETH EX AERO: 1.0000 "application " | INHALATION_SPRAY | CUTANEOUS | Status: DC | PRN

## 2018-05-07 MED ORDER — LORAZEPAM 2 MG/ML IJ SOLN
INTRAMUSCULAR | Status: AC
  Filled 2018-05-07: qty 1

## 2018-05-07 MED ORDER — HEPARIN SODIUM (PORCINE) 1000 UNIT/ML IJ SOLN
INTRAMUSCULAR | Status: AC
  Filled 2018-05-07: qty 4

## 2018-05-07 MED ORDER — PANTOPRAZOLE SODIUM 40 MG PO TBEC
40.0000 mg | DELAYED_RELEASE_TABLET | Freq: Every day | ORAL | Status: DC
  Administered 2018-05-07 – 2018-05-08 (×2): 40 mg via ORAL
  Filled 2018-05-07 (×2): qty 1

## 2018-05-07 MED ORDER — HEPARIN SODIUM (PORCINE) 1000 UNIT/ML DIALYSIS
1000.0000 [IU] | INTRAMUSCULAR | Status: DC | PRN
  Filled 2018-05-07: qty 1

## 2018-05-07 MED ORDER — DARBEPOETIN ALFA 200 MCG/0.4ML IJ SOSY
200.0000 ug | PREFILLED_SYRINGE | INTRAMUSCULAR | Status: DC
  Filled 2018-05-07: qty 0.4

## 2018-05-07 NOTE — Progress Notes (Signed)
Transfer from 64M, alert and oriented blood transfusion ongoing ,to start cardezim drip.Patient oriented to room,CHG bath done ,attached to cardiac monitoring,CCMD notified V/S checked.Will continue to monitor.

## 2018-05-07 NOTE — Progress Notes (Signed)
Patient converted to Sinus rhythm.

## 2018-05-07 NOTE — Significant Event (Signed)
Rapid Response Event Note  Overview:  Called by bedside RN stating pt has converted into afib RVR. She also states pt is scheduled to get dialysis tonight.     Initial Focused Assessment: On arrival, pt restless in bed moaning out in pain. Pt appears pale with HR 130-140s afib, BP 110/80, spO2 100% on RA. Pt last Hgb 5.6 blood transfusion ordered but had not started.   Interventions: 1unit PRBCs started, 10mg  Cardizem given IV push, TRH NP called.   Plan of Care (if not transferred): Continue blood transfusion at low rate per NP. Will transfer pt to PCU, start cardizem gtt, call HD to get dialysis time. Bedside RN educated on plan of care and to call with any changes or questions. This RN called to another unit to assess different pt. RN states understanding of plan. Will follow up.   Event Summary:   called at  [place]    event ended at [place]

## 2018-05-07 NOTE — Progress Notes (Signed)
PROGRESS NOTE    Christina [name]  DXA:[id number] DOB: 1950-06-22 DOA: 05/06/2018 PCP: Maggie Schwalbe, PA-C   Brief Narrative: 68 year old with past medical significant for end-stage renal disease on hemodialysis she has a chest wall dialysis catheter, CVA without deficits, coronary artery disease a status post a STEMI 2010 status post PCI, carotid endarterectomy, hypertension, hyperlipidemia COPD, bipolar who presented to [facility] complaining of generalized weakness, lightheadedness, dizziness while attempting to go to dialysis.  Patient was found to have a hemoglobin at 5.  She reports melena.  She was transferred from [place] to [facility] for GI evaluation.  Overnight patient developed A. fib with RVR.  She was a started on Cardizem drip.  She has received 1 unit of packed red blood cell.  Repeated hemoglobin at 6.  Will transfuse second unit of packed red blood cell.   Assessment & Plan:   Principal Problem:   Acute GI bleeding Active Problems:   ESRD (end stage renal disease) (HCC)   Anxiety   Bipolar affective (HCC)   CAD (coronary artery disease), native coronary artery   COPD with chronic bronchitis (HCC)   CVA (cerebral vascular accident) ([place])   Essential hypertension   Hyperlipidemia   1-Acute blood loss anemia; symptomatic also component anemia of chronic diseases. Present with a hemoglobin at 5.  Reports melena.  Although she has been taking oral iron. She received 1 unit of packed red blood cell, repeated hemoglobin at 6.  Will transfuse another unit. GI has been consulted.  2-Acute GI bleed, melena: Hemoglobin at 5 on admission. GI consulted. I have changed IV Protonix twice daily to Protonix drip. Might require endoscopy.  3-end-stage renal disease on hemodialysis: Nephrology has been consulted.  4-A. fib with RVR: We will would not start anticoagulation due to GI bleed and low hemoglobin. Continue with Cardizem drip.  Discontinue Norvasc to allow  high blood pressure. Continue with labetalol  5-hypertension: Blood pressure soft in the setting of GI bleed.  Discontinue Norvasc, Imdur.  6- coronary artery disease history of PCI 2010: Hold aspirin and Plavix due to severe anemia. I will hold indoor due to hypotension.  Anxiety/depression/bipolar disorder -Continue home medications  History of CVA without deficits: Hold aspirin and Plavix  There is no height or weight on file to calculate BMI.   DVT prophylaxis: SCD Code Status: Full code Family Communication: Patient decline that I call her family for updates.  Disposition Plan: remain in the hospital for blood transfusion and evaluation of GI bleed.   Consultants:   Nephrology  Gastroenterologist    Procedures:   HD  Antimicrobials: none  Subjective: She is sleepy, restless. Report black stool. She wants to eat.   Objective: Vitals:   05/07/18 0219 05/07/18 0303 05/07/18 0411 05/07/18 0412  BP: 104/70 106/63  113/64  Pulse: 76 76  73  Resp: 11 16  16   Temp: 97.6 F (36.4 C)  (!) 97.5 F (36.4 C) (!) 97.5 F (36.4 C)  TempSrc: Oral  Oral Oral  SpO2: 94% 97%  100%  Weight: 42.2 kg       Intake/Output Summary (Last 24 hours) at 05/07/2018 0810 Last data filed at 05/07/2018 0636 Gross per 24 hour  Intake 414.42 ml  Output 600 ml  Net -185.58 ml   Filed Weights   05/06/18 2045 05/07/18 0219  Weight: 44.2 kg 42.2 kg    Examination:  General exam: Appears calm and comfortable  Respiratory system: Clear to auscultation. Respiratory effort normal. Cardiovascular  system: S1 & S2 heard, RRR. No JVD, murmurs, rubs, gallops or clicks. No pedal edema. Gastrointestinal system: Abdomen is nondistended, soft and nontender. No organomegaly or masses felt. Normal bowel sounds heard. Central nervous system: Alert and oriented. No focal neurological deficits. Extremities: Symmetric 5 x 5 power. Skin: No rashes, lesions or ulcers    Data Reviewed: I have  personally reviewed following labs and imaging studies  CBC: Recent Labs  Lab 05/06/18 1655  WBC 7.7  HGB 5.6*  HCT 18.3*  MCV 97.9  PLT 751   Basic Metabolic Panel: Recent Labs  Lab 05/06/18 1655  NA 140  K 4.3  CL 106  CO2 18*  GLUCOSE 90  BUN 138*  CREATININE 4.82*  CALCIUM 8.2*   GFR: CrCl cannot be calculated (Unknown ideal weight.). Liver Function Tests: No results for input(s): AST, ALT, ALKPHOS, BILITOT, PROT, ALBUMIN in the last 168 hours. No results for input(s): LIPASE, AMYLASE in the last 168 hours. No results for input(s): AMMONIA in the last 168 hours. Coagulation Profile: No results for input(s): INR, PROTIME in the last 168 hours. Cardiac Enzymes: No results for input(s): CKTOTAL, CKMB, CKMBINDEX, TROPONINI in the last 168 hours. BNP (last 3 results) No results for input(s): PROBNP in the last 8760 hours. HbA1C: No results for input(s): HGBA1C in the last 72 hours. CBG: Recent Labs  Lab 05/06/18 1546  GLUCAP 74   Lipid Profile: No results for input(s): CHOL, HDL, LDLCALC, TRIG, CHOLHDL, LDLDIRECT in the last 72 hours. Thyroid Function Tests: No results for input(s): TSH, T4TOTAL, FREET4, T3FREE, THYROIDAB in the last 72 hours. Anemia Panel: No results for input(s): VITAMINB12, FOLATE, FERRITIN, TIBC, IRON, RETICCTPCT in the last 72 hours. Sepsis Labs: No results for input(s): PROCALCITON, LATICACIDVEN in the last 168 hours.  No results found for this or any previous visit (from the past 240 hour(s)).       Radiology Studies: No results found.      Scheduled Meds: . sodium chloride   Intravenous Once  . allopurinol  100 mg Oral Daily  . calcitRIOL  0.5 mcg Oral Q T,Th,Sa-HD  . Chlorhexidine Gluconate Cloth  6 each Topical Q0600  . darbepoetin (ARANESP) injection - DIALYSIS  200 mcg Intravenous Q Thu-HD  . dicyclomine  10 mg Oral TID AC & HS  . gabapentin  300 mg Oral QHS  . labetalol  200 mg Oral BID  . multivitamin  1 tablet  Oral QHS  . [START ON 05/10/2018] pantoprazole  40 mg Intravenous Q12H  . pravastatin  80 mg Oral QHS  . sevelamer carbonate  2,400 mg Oral TID WC   Continuous Infusions: . diltiazem (CARDIZEM) infusion 5 mg/hr (05/07/18 0228)  . pantoprazole (PROTONIX) IVPB    . pantoprozole (PROTONIX) infusion       LOS: 1 day    Time spent: 35 minutes.     Elmarie Shiley, MD [facility] Pager [phone]  If 7PM-7AM, please contact night-coverage [email or web address] Password [email or web address] 05/07/2018, 8:10 AM

## 2018-05-07 NOTE — Progress Notes (Addendum)
Patient continues to call out stating she needs something for pain stating she hurts all over.  Pain medicine has been given.  No further vomiting since earlier in shift.  No respiratory distress.  Patient was calm and rested after ativan was given.  This RN has attempted to call dialysis unit twice and no answer.  [place], NP aware that dialysis unit has not answered.  Per Dr. Shelva Majestic not dialysis could be middle of night or in the a.m.  No bowel movements this shift.  PRBC to be given and oncoming nurse aware.  Report given to [place], RN who is now taking over patient's care.

## 2018-05-07 NOTE — Progress Notes (Signed)
Wasted 2mg  of ativan with Sima Matas RN.

## 2018-05-07 NOTE — Progress Notes (Addendum)
Daily Rounding Note  05/07/2018, 10:03 AM  LOS: 1 day   SUBJECTIVE:   Chief complaint: Anemia, dark stools.    Afib RVR to 140s last evening and overnight. Interval conversion to NSR with meds.   Anxious, moaning/groaning overnight.    Seen now in HD, more relaxed and says she slept well last night. Still has abd pain, no nausea.  Hungry as she is NPO.  No BM's yest or today.        OBJECTIVE:         Vital signs in last 24 hours:    Temp:  [97.5 F (36.4 C)-98.2 F (36.8 C)] 98.1 F (36.7 C) (04/10 0845) Pulse Rate:  [73-145] 77 (04/10 0845) Resp:  [11-20] 18 (04/10 0845) BP: (104-143)/(54-94) 116/54 (04/10 0845) SpO2:  [94 %-100 %] 98 % (04/10 0845) Weight:  [42.2 kg-44.2 kg] 42.2 kg (04/10 0219) Last BM Date: 05/06/18 Filed Weights   05/06/18 2045 05/07/18 0219  Weight: 44.2 kg 42.2 kg   General: chronically ill looking, easily agitated   Heart: RRR Chest: no labored breathing or cough Abdomen: soft, hypoactive BS.  Little to no tenderness to light/moderate pressure.  ND  Extremities: no CCE Neuro/Psych:  Alert, oriented x 3.  Easily agitated but overall calmer than yesterday.    Intake/Output from previous day: 04/09 0701 - 04/10 0700 In: 414.4 [I.V.:20.4; Blood:394] Out: 600 [Urine:600]  Intake/Output this shift: No intake/output data recorded.  Lab Results: Recent Labs    05/06/18 1655 05/07/18 0914  WBC 7.7  --   HGB 5.6* 6.0*  HCT 18.3* 18.4*  PLT 220  --    BMET Recent Labs    05/06/18 1655  NA 140  K 4.3  CL 106  CO2 18*  GLUCOSE 90  BUN 138*  CREATININE 4.82*  CALCIUM 8.2*   LFT No results for input(s): PROT, ALBUMIN, AST, ALT, ALKPHOS, BILITOT, BILIDIR, IBILI in the last 72 hours. PT/INR No results for input(s): LABPROT, INR in the last 72 hours. Hepatitis Panel No results for input(s): HEPBSAG, HCVAB, HEPAIGM, HEPBIGM in the last 72 hours.  Studies/Results: No results  found.  Scheduled Meds: . sodium chloride   Intravenous Once  . allopurinol  100 mg Oral Daily  . calcitRIOL  0.5 mcg Oral Q T,Th,Sa-HD  . Chlorhexidine Gluconate Cloth  6 each Topical Q0600  . darbepoetin (ARANESP) injection - DIALYSIS  200 mcg Intravenous Q Fri-HD  . dicyclomine  10 mg Oral TID AC & HS  . gabapentin  300 mg Oral QHS  . labetalol  200 mg Oral BID  . multivitamin  1 tablet Oral QHS  . [START ON 05/10/2018] pantoprazole  40 mg Intravenous Q12H  . pravastatin  80 mg Oral QHS  . sevelamer carbonate  2,400 mg Oral TID WC   Continuous Infusions: . sodium chloride    . sodium chloride    . diltiazem (CARDIZEM) infusion 5 mg/hr (05/07/18 0228)  . pantoprozole (PROTONIX) infusion 8 mg/hr (05/07/18 0922)   PRN Meds:.sodium chloride, sodium chloride, alteplase, heparin, HYDROcodone-acetaminophen, ipratropium-albuterol, lidocaine (PF), lidocaine-prilocaine, LORazepam, ondansetron (ZOFRAN) IV, pentafluoroprop-tetrafluoroeth   ASSESMENT:   *   Recurrent acute on chronic anemia.  FOBT positive.  Stool has been black since starting on iron in the last couple of weeks. Blood transfusion 3/19 at [facility] for Hgb 6.3. Suspect anemia is multifactorial with significant contribution of her renal failure. Remote history of bleeding peptic ulcer disease.  low-dose Pepcid at home. Now on Protonix gtt in place.  .  hgb 5.6 >> 1 U PRBC >> 6.  Has started Aranesp.   Colonscopy (normal per notes) and EGD(no notations as to findings) 08/2017 per Dr Odie Sera.    *     Chronic abdominal pain dating back to surgery for SBO  Jan 2020.  3/22 KUB at [facility] with no signs of bowel obstruction and no significant pathology. Lipase 400, upper normal 300, (not 3 x normal which is pancreatitis criteria).  Pancreas normal on noncon CT at [place] in place.     *     Anxiety. Bipolar.    *     ESRD.  Started hemodialysis as inpt 3/20.   *   Afib, RVR  *   Chronic  Plavix.  On hold.      PLAN   *  Switching to BID PO Protonix.  No plans for EGD etc.  Dr Henrene Pastor comments below.    Azucena Freed  05/07/2018, 10:03 AM Phone [phone]  GI ATTENDING  Case discussed with Dr. Rush Landmark.  Interval history and data reviewed.  Patient has significant chronic anemia which is multifactorial.  Previous GI evaluation in recent months including colonoscopy and upper endoscopy.  No acute bleeding at this time, only occult.  Current issues with atrial fibrillation.  She is on hemodialysis.  Also on chronic Plavix therapy which can lead to nonspecific mucosal oozing of the GI mucosa.  Agree with iron, Aranesp, transfusion to clinically appropriate hemoglobin, and daily oral PPI for upper GI mucosal protection.  No plans for endoscopic intervention or evaluation short of acute bleeding.  She can resume her outpatient GI care with her regular GI physicians.  We are available for questions or problems while she is an inpatient, but will sign off at this time.  Thank you  Docia Chuck. Geri Seminole., M.D. [facility] Division of Gastroenterology

## 2018-05-07 NOTE — Progress Notes (Signed)
Pt to transfer to 4E11. Report given to Gem. All belongings with the pt.

## 2018-05-07 NOTE — Progress Notes (Signed)
Pt is very restless, moaning and groaning. Pt c/o of generalized pain 10/10. Pt was given Oxycodone at 2319 by the previous RN. PRN Norco given. CCMD called and patient started running Afib again. Made NP on call aware. Cardizem 10 mg x1 ordered. RRRN consulted.

## 2018-05-07 NOTE — Progress Notes (Signed)
Pt's HGB remains 5.6, blood transfusion started for this RN and Charge RN could not get on touch with dialysis unit. RRRN at bedside assessing pt.

## 2018-05-07 NOTE — Consult Note (Signed)
Referring Provider: No ref. provider found Primary Care Physician:  Maggie Schwalbe, PA-C Primary Nephrologist:  Dr. Justin Mend  Reason for Consultation: Medical management end-stage renal disease, maintenance of euvolemia, treatment of secondary parathyroidism and evaluation of anemia  HPI: This is a 68 year old-year-old lady with a history of end-stage renal disease she is on hemodialysis.  She receives a dialysis Tuesday Thursday Saturday at [facility].  She has a history of a CVA without defects she has a history of coronary disease and underwent PCI for STEMI in 2010.  She has a history of carotid endarterectomy and COPD.  She has hypertension hyperlipidemia bipolar disorder.  She was seen in [facility] emergency room with a hemoglobin of 5 and was sent to [facility] for GI evaluation.  She has A. fib with a rapid rate on arrival and was started on a Cardizem drip.  Blood pressure 116/54 pulse 77 temperature 98.1 O2 sats 96% room air  Allopurinol 100 mg daily, diltiazem drip, labetalol 200 mg twice daily, pravastatin 80 mg nightly, lorazepam 1 mg IV every 6 hours as needed, calcitriol 0.5 mcg Tuesday Thursday Saturday, Bentyl 10 mg 3 times daily, Protonix IV 40 mg every 12 hours, sevelamer 2.4 g 3 times daily with meals, darbepoetin 200 mcg IV last administered 05/07/2018.  Neurontin 300 mg nightly  Sodium 140 potassium 4.3 chloride 106 CO2 18 BUN 138 creatinine 4.82 glucose 90 WBC 7.7 hemoglobin 5.6 platelets 220.  Recheck hemoglobin 6.0 05/07/2018  Transfused 1 unit packed red blood cells 05/07/2018      Prior to Admission medications   Medication Sig Start Date End Date Taking? Authorizing Provider  allopurinol (ZYLOPRIM) 100 MG tablet Take 100 mg by mouth daily. 02/18/18  Yes [provider]  amLODipine (NORVASC) 10 MG tablet Take 10 mg by mouth at bedtime. 04/21/18  Yes [provider]  aspirin EC 81 MG tablet Take 81 mg by mouth daily.   Yes [provider]   Cholecalciferol (VITAMIN D3) 50 MCG (2000 UT) capsule Take 2,000 Units by mouth every morning.   Yes [provider]  clopidogrel (PLAVIX) 75 MG tablet Take 75 mg by mouth every morning. 08/24/17  Yes [provider]  dicyclomine (BENTYL) 10 MG capsule Take 10 mg by mouth 4 (four) times daily. Before meals and nightly 08/27/17  Yes [provider]  gabapentin (NEURONTIN) 300 MG capsule Take 300 mg by mouth at bedtime. 04/21/18  Yes [provider]  Ipratropium-Albuterol (COMBIVENT) 20-100 MCG/ACT AERS respimat Inhale 2 puffs into the lungs 4 (four) times daily as needed for wheezing. 09/11/17  Yes [provider]  isosorbide mononitrate (IMDUR) 30 MG 24 hr tablet Take 30 mg by mouth daily. 01/05/18  Yes [provider]  labetalol (NORMODYNE) 200 MG tablet Take 200 mg by mouth 2 (two) times daily. 03/03/18  Yes [provider]  multivitamin ([name]-VIT) TABS tablet Take 1 tablet by mouth daily. 04/21/18  Yes [provider]  nitroGLYCERIN (NITROSTAT) 0.4 MG SL tablet Place 0.4 mg under the tongue every 5 (five) minutes as needed for chest pain.  09/18/16  Yes [provider]  omeprazole (PRILOSEC) 40 MG capsule Take 40 mg by mouth daily. 03/31/16  Yes [provider]  oxyCODONE-acetaminophen (PERCOCET/ROXICET) 5-325 MG tablet Take 1 tablet by mouth every 6 (six) hours as needed for pain. 02/17/18  Yes [provider]  pravastatin (PRAVACHOL) 80 MG tablet Take 80 mg by mouth at bedtime. 06/30/16  Yes [provider]  sevelamer carbonate (RENVELA) 800 MG tablet Take 2,400 mg by mouth 3 (three) times daily. 10/15/17  Yes [provider]    Current Facility-Administered Medications  Medication Dose Route Frequency Provider Last Rate Last Dose  . 0.9 %  sodium chloride infusion (Manually program via Guardrails IV Fluids)   Intravenous Once Little Ishikawa, MD      . 0.9 %  sodium chloride infusion  (Manually program via Guardrails IV Fluids)   Intravenous Once Regalado, Belkys A, MD      . 0.9 %  sodium chloride infusion  100 mL Intravenous PRN Corliss Parish, MD      . 0.9 %  sodium chloride infusion  100 mL Intravenous PRN Corliss Parish, MD      . allopurinol (ZYLOPRIM) tablet 100 mg  100 mg Oral Daily Little Ishikawa, MD   100 mg at 05/07/18 0905  . alteplase (CATHFLO ACTIVASE) injection 2 mg  2 mg Intracatheter Once PRN Corliss Parish, MD      . calcitRIOL (ROCALTROL) capsule 0.5 mcg  0.5 mcg Oral Q T,Th,Sa-HD Corliss Parish, MD      . Chlorhexidine Gluconate Cloth 2 % PADS 6 each  6 each Topical Q0600 Corliss Parish, MD   6 each at 05/07/18 [phone]  . Darbepoetin Alfa (ARANESP) injection 200 mcg  200 mcg Intravenous Q Fri-HD Regalado, Belkys A, MD      . dicyclomine (BENTYL) capsule 10 mg  10 mg Oral TID AC & HS Little Ishikawa, MD   10 mg at 05/07/18 0905  . diltiazem (CARDIZEM) 100 mg in dextrose 5% 127mL (1 mg/mL) infusion  5-15 mg/hr Intravenous Titrated Bodenheimer, Charles A, NP 5 mL/hr at 05/07/18 0228 5 mg/hr at 05/07/18 0228  . gabapentin (NEURONTIN) capsule 300 mg  300 mg Oral QHS Little Ishikawa, MD   300 mg at 05/07/18 0012  . heparin injection 1,000 Units  1,000 Units Dialysis PRN Corliss Parish, MD      . HYDROcodone-acetaminophen (NORCO/VICODIN) 5-325 MG per tablet 1 tablet  1 tablet Oral Q6H PRN Little Ishikawa, MD   1 tablet at 05/07/18 0011  . ipratropium-albuterol (DUONEB) 0.5-2.5 (3) MG/3ML nebulizer solution 3 mL  3 mL Inhalation QID PRN Little Ishikawa, MD      . labetalol (NORMODYNE) tablet 200 mg  200 mg Oral BID Little Ishikawa, MD   200 mg at 05/07/18 0012  . lidocaine (PF) (XYLOCAINE) 1 % injection 5 mL  5 mL Intradermal PRN Corliss Parish, MD      . lidocaine-prilocaine (EMLA) cream 1 application  1 application Topical PRN Corliss Parish, MD      . LORazepam (ATIVAN) injection 1 mg  1  mg Intravenous Q6H PRN Bodenheimer, Charles A, NP   1 mg at 05/07/18 0631  . multivitamin ([name]-VIT) tablet 1 tablet  1 tablet Oral QHS Little Ishikawa, MD   1 tablet at 05/07/18 0012  . ondansetron (ZOFRAN) injection 4 mg  4 mg Intravenous Q6H PRN Bodenheimer, Charles A, NP   4 mg at 05/06/18 2130  . pantoprazole (PROTONIX) 80 mg in sodium chloride 0.9 % 250 mL (0.32 mg/mL) infusion  8 mg/hr Intravenous Continuous Regalado, Belkys A, MD 25 mL/hr at 05/07/18 0922 8 mg/hr at 05/07/18 0922  . [START ON 05/10/2018] pantoprazole (PROTONIX) injection 40 mg  40 mg Intravenous Q12H Regalado, Belkys A, MD      . pentafluoroprop-tetrafluoroeth (GEBAUERS) aerosol 1 application  1 application Topical PRN Corliss Parish,  MD      . pravastatin (PRAVACHOL) tablet 80 mg  80 mg Oral QHS Little Ishikawa, MD   80 mg at 05/07/18 0012  . sevelamer carbonate (RENVELA) tablet 2,400 mg  2,400 mg Oral TID WC Little Ishikawa, MD        Allergies as of 05/06/2018 - Review Complete 05/06/2018  Allergen Reaction Noted  . Cyclobenzaprine Anaphylaxis and Other (See Comments) 04/09/2015  . Morphine Anaphylaxis 04/09/2015  . Penicillins Other (See Comments), Shortness Of Breath, and Swelling 04/09/2015  . Hydromorphone Other (See Comments) 05/29/2015  . Codeine Itching and Rash 04/09/2015    No family history on file.  Social History   Socioeconomic History  . Marital status: Single    Spouse name: Not on file  . Number of children: Not on file  . Years of education: Not on file  . Highest education level: Not on file  Occupational History  . Not on file  Social Needs  . Financial resource strain: Not on file  . Food insecurity:    Worry: Not on file    Inability: Not on file  . Transportation needs:    Medical: Not on file    Non-medical: Not on file  Tobacco Use  . Smoking status: Not on file  Substance and Sexual Activity  . Alcohol use: Not on file  . Drug use: Not on file  .  Sexual activity: Not on file  Lifestyle  . Physical activity:    Days per week: Not on file    Minutes per session: Not on file  . Stress: Not on file  Relationships  . Social connections:    Talks on phone: Not on file    Gets together: Not on file    Attends religious service: Not on file    Active member of club or organization: Not on file    Attends meetings of clubs or organizations: Not on file    Relationship status: Not on file  . Intimate partner violence:    Fear of current or ex partner: Not on file    Emotionally abused: Not on file    Physically abused: Not on file    Forced sexual activity: Not on file  Other Topics Concern  . Not on file  Social History Narrative  . Not on file    Review of Systems: Gen: Fatigued and weak no fever sweats or chills HEENT: No visual complaints, No history of Retinopathy. Normal external appearance No Epistaxis or Sore throat. No sinusitis.   CV: Denies chest pain, angina, palpitations, syncope, orthopnea, PND, peripheral edema, and claudication. Resp: Denies dyspnea at rest, dyspnea with exercise, cough, sputum, wheezing, coughing up blood, and pleurisy. GI: History of GI bleed appreciate assistance from GI GU : End-stage renal disease with anuria MS: Denies joint pain, limitation of movement, and swelling, stiffness, low back pain, extremity pain. Denies muscle weakness, cramps, atrophy.  No use of non steroidal antiinflammatory drugs. Derm: Denies rash, itching, dry skin, hives, moles, warts, or unhealing ulcers.  Psych: History of bipolar disorder Heme: Noted drop of hemoglobin Neuro: No headache.  No diplopia. No dysarthria.  No dysphasia.  No history of CVA.  No Seizures. No paresthesias.  No weakness. Endocrine No DM.  No Thyroid disease.  No Adrenal disease.  Physical Exam: Vital signs in last 24 hours: Temp:  [97.5 F (36.4 C)-98.2 F (36.8 C)] 98.1 F (36.7 C) (04/10 0845) Pulse Rate:  [73-145] 77 (04/10  6010) Resp:  [11-20] 18 (04/10 0845) BP: (104-143)/(54-94) 116/54 (04/10 0845) SpO2:  [94 %-100 %] 98 % (04/10 0845) Weight:  [42.2 kg-44.2 kg] 42.2 kg (04/10 0219) Last BM Date: 05/06/18 General:   Cachectic appearing frail lady nondistressed Head:  Normocephalic and atraumatic. Eyes:  Sclera clear, no icterus.   Conjunctiva pale Ears:  Normal auditory acuity. Nose:  No deformity, discharge,  or lesions. Mouth:  No deformity or lesions, dentition normal. Neck:  Supple; no masses or thyromegaly. JVP not elevated Lungs:  Clear throughout to auscultation.   No wheezes, crackles, or rhonchi. No acute distress.  Dialysis catheter Heart: Regular rate and rhythm 3/6 systolic murmur Abdomen:  Soft, nontender and nondistended. No masses, hepatosplenomegaly or hernias noted. Normal bowel sounds, without guarding, and without rebound.   Msk:  Symmetrical without gross deformities. Normal posture. Pulses:  No carotid, renal, femoral bruits. DP and PT symmetrical and equal Extremities:  Without clubbing or edema. Neurologic:  Alert and  oriented x4;  grossly normal neurologically. Skin:  Intact without significant lesions or rashes.   Intake/Output from previous day: 04/09 0701 - 04/10 0700 In: 414.4 [I.V.:20.4; Blood:394] Out: 600 [Urine:600] Intake/Output this shift: No intake/output data recorded.  Lab Results: Recent Labs    05/06/18 1655 05/07/18 0914  WBC 7.7  --   HGB 5.6* 6.0*  HCT 18.3* 18.4*  PLT 220  --    BMET Recent Labs    05/06/18 1655  NA 140  K 4.3  CL 106  CO2 18*  GLUCOSE 90  BUN 138*  CREATININE 4.82*  CALCIUM 8.2*   LFT No results for input(s): PROT, ALBUMIN, AST, ALT, ALKPHOS, BILITOT, BILIDIR, IBILI in the last 72 hours. PT/INR No results for input(s): LABPROT, INR in the last 72 hours. Hepatitis Panel No results for input(s): HEPBSAG, HCVAB, HEPAIGM, HEPBIGM in the last 72 hours.  Studies/Results: No results  found.  Assessment/Plan:  ESRD-Tuesday Thursday dialysis will plan dialysis treatment for 05/08/2018 she is a relatively new start to dialysis appears that she began in March 2020.  It appears that she is noncompliant with her dialysis treatments.  Last dialysis was 05/01/2018  ANEMIA-appears to have acute blood loss anemia.  Primary service is consulted GI.  Will place patient on darbepoetin she is received 1 unit packed red blood cells another unit has been prescribed.  MBD-calcitriol 0.5 micrograms 3 times a week  HTN/VOL-appears to be relatively well-controlled dry weight 42 kg  ACCESS-she has a developing AV fistula left forearm that is unable to be used at this time  Atrial fibrillation with rapid ventricular rate setting of GI bleed would not use Coumadin.  She is also been evaluated at [facility].  She has a history of atrial fibrillation and a history of falls they have felt that she is in extreme bleeding risk for Coumadin and not a candidate for anticoagulation.  History of small bowel obstruction  History of polysubstance abuse including cocaine marijuana and alcohol  She is DNR status.  History of coronary artery disease PCI 2010 holding aspirin and Plavix at this time   LOS: [place] @TODAY @11 :09 AM

## 2018-05-08 LAB — RENAL FUNCTION PANEL
Albumin: 2.5 g/dL — ABNORMAL LOW (ref 3.5–5.0)
Albumin: 2.5 g/dL — ABNORMAL LOW (ref 3.5–5.0)
Anion gap: 11 (ref 5–15)
Anion gap: 9 (ref 5–15)
BUN: 53 mg/dL — ABNORMAL HIGH (ref 8–23)
BUN: 25 mg/dL — ABNORMAL HIGH (ref 8–23)
CO2: 25 mmol/L (ref 22–32)
CO2: 28 mmol/L (ref 22–32)
Calcium: 7.5 mg/dL — ABNORMAL LOW (ref 8.9–10.3)
Calcium: 8.1 mg/dL — ABNORMAL LOW (ref 8.9–10.3)
Chloride: 103 mmol/L (ref 98–111)
Chloride: 102 mmol/L (ref 98–111)
Creatinine, Ser: 2.03 mg/dL — ABNORMAL HIGH (ref 0.44–1.00)
Creatinine, Ser: 2.33 mg/dL — ABNORMAL HIGH (ref 0.44–1.00)
GFR calc Af Amer: 28 mL/min — ABNORMAL LOW (ref >60)
GFR calc Af Amer: 24 mL/min — ABNORMAL LOW (ref >60)
GFR calc non Af Amer: 25 mL/min — ABNORMAL LOW (ref >60)
GFR calc non Af Amer: 21 mL/min — ABNORMAL LOW (ref >60)
Glucose, Bld: 113 mg/dL — ABNORMAL HIGH (ref 70–99)
Glucose, Bld: 105 mg/dL — ABNORMAL HIGH (ref 70–99)
Phosphorus: 2 mg/dL — ABNORMAL LOW (ref 2.5–4.6)
Phosphorus: 4.2 mg/dL (ref 2.5–4.6)
Potassium: 3 mmol/L — ABNORMAL LOW (ref 3.5–5.1)
Potassium: 3.2 mmol/L — ABNORMAL LOW (ref 3.5–5.1)
Sodium: 139 mmol/L (ref 135–145)
Sodium: 139 mmol/L (ref 135–145)

## 2018-05-08 LAB — TYPE AND SCREEN
ABO/RH(D): A POS
Antibody Screen: NEGATIVE
Unit division: 0
Unit division: 0

## 2018-05-08 LAB — CBC
HCT: 21.7 % — ABNORMAL LOW (ref 36.0–46.0)
Hemoglobin: 7.4 g/dL — ABNORMAL LOW (ref 12.0–15.0)
MCH: 31.4 pg (ref 26.0–34.0)
MCHC: 34.1 g/dL (ref 30.0–36.0)
MCV: 91.9 fL (ref 80.0–100.0)
Platelets: 138 10*3/uL — ABNORMAL LOW (ref 150–400)
RBC: 2.36 MIL/uL — ABNORMAL LOW (ref 3.87–5.11)
RDW: 18.7 % — ABNORMAL HIGH (ref 11.5–15.5)
WBC: 8 10*3/uL (ref 4.0–10.5)
nRBC: 0.4 % — ABNORMAL HIGH (ref 0.0–0.2)

## 2018-05-08 LAB — AMMONIA: Ammonia: 21 umol/L (ref 9–35)

## 2018-05-08 LAB — IRON AND TIBC
Iron: 31 ug/dL (ref 28–170)
Saturation Ratios: 10 % — ABNORMAL LOW (ref 10.4–31.8)
TIBC: 302 ug/dL (ref 250–450)
UIBC: 271 ug/dL

## 2018-05-08 LAB — BPAM RBC
Blood Product Expiration Date: 202004182359
Blood Product Expiration Date: 202004202359
ISSUE DATE / TIME: 202004100039
ISSUE DATE / TIME: 202004101202
Unit Type and Rh: 6200
Unit Type and Rh: 6200

## 2018-05-08 MED ORDER — DILTIAZEM HCL 60 MG PO TABS
30.0000 mg | ORAL_TABLET | Freq: Three times a day (TID) | ORAL | Status: DC
  Administered 2018-05-08 – 2018-05-09 (×2): 30 mg via ORAL
  Filled 2018-05-08 (×2): qty 1

## 2018-05-08 MED ORDER — HEPARIN SODIUM (PORCINE) 1000 UNIT/ML IJ SOLN
INTRAMUSCULAR | Status: AC
  Administered 2018-05-08: 3200 [IU]
  Filled 2018-05-08: qty 3

## 2018-05-08 MED ORDER — DIPHENHYDRAMINE HCL 25 MG PO CAPS
ORAL_CAPSULE | ORAL | Status: AC
  Administered 2018-05-08: 25 mg via ORAL
  Filled 2018-05-08: qty 1

## 2018-05-08 MED ORDER — ONDANSETRON HCL 4 MG/2ML IJ SOLN
INTRAMUSCULAR | Status: AC
  Administered 2018-05-08: 4 mg via INTRAVENOUS
  Filled 2018-05-08: qty 2

## 2018-05-08 MED ORDER — POLYETHYLENE GLYCOL 3350 17 G PO PACK
17.0000 g | PACK | Freq: Every day | ORAL | Status: DC
  Administered 2018-05-09: 17 g via ORAL
  Filled 2018-05-08: qty 1

## 2018-05-08 MED ORDER — CALCITRIOL 0.5 MCG PO CAPS
ORAL_CAPSULE | ORAL | Status: AC
  Administered 2018-05-08: 0.5 ug via ORAL
  Filled 2018-05-08: qty 1

## 2018-05-08 MED ORDER — PANTOPRAZOLE SODIUM 40 MG PO TBEC
40.0000 mg | DELAYED_RELEASE_TABLET | Freq: Two times a day (BID) | ORAL | Status: DC
  Administered 2018-05-08 – 2018-05-09 (×2): 40 mg via ORAL
  Filled 2018-05-08 (×3): qty 1

## 2018-05-08 MED ORDER — DIPHENHYDRAMINE HCL 25 MG PO CAPS
25.0000 mg | ORAL_CAPSULE | Freq: Three times a day (TID) | ORAL | Status: DC | PRN
  Administered 2018-05-08: 15:00:00 25 mg via ORAL

## 2018-05-08 NOTE — Plan of Care (Signed)
Care plans reviewed and patient is progressing.  

## 2018-05-08 NOTE — Progress Notes (Signed)
[facility] ROUNDING NOTE   Subjective:   68 year old lady end-stage renal disease Tuesday Thursday Saturday [place].  She has a history of CVA without deficit history of coronary artery disease status post PCI and STEMI in 2010.  She has a history of carotid endarterectomy and COPD hypertension hyperlipidemia bipolar disorder.  She was admitted with GI bleed.  She received 1 unit packed red blood cells on admission.  Hemoglobin 6.  Remote history of peptic ulcer disease.  Previous colonoscopy and upper endoscopy have been unremarkable.  Appreciate assistance from Dr. Scarlette Shorts.  She is followed by Dr. Rush Landmark.  It appears that her problems are chronic and multifactorial.  Daily oral PPIs have been recommended with outpatient follow-up.  Blood pressure 153/78 pulse 95 temperature 97.9 O2 sats 99% room air  Allopurinol 100 mg daily, diltiazem drip, labetalol 200 mg twice daily, pravastatin 80 mg daily Calcitrol 0.5 mcg Tuesday Thursday Saturday, Bentyl 10 mg 3 times daily, Protonix 40 mg daily, sevelamer 2.4 g 3 times daily with meals, Aranesp 200 mcg IV weekly, gabapentin 300 mg nightly  Status post transfusion 05/07/2018 2 units packed red blood cells   Sodium 139 potassium 3.2 chloride 102 CO2 28 BUN 25 creatinine 2.33 glucose 105 calcium 8.1 phosphorus 4.2 albumin 2.5 WBC 8.0 hemoglobin 7.4 platelets 138    Objective:  Vital signs in last 24 hours:  Temp:  [97.6 F (36.4 C)-98.3 F (36.8 C)] 97.9 F (36.6 C) (04/11 0851) Pulse Rate:  [56-93] 75 (04/11 0851) Resp:  [13-16] 14 (04/11 0851) BP: (85-161)/(41-109) 152/78 (04/11 0851) SpO2:  [95 %-100 %] 95 % (04/11 0851) Weight:  [42.4 kg-42.9 kg] 42.9 kg (04/10 1445)  Weight change: -1.8 kg Filed Weights   05/07/18 0219 05/07/18 1029 05/07/18 1445  Weight: 42.2 kg 42.4 kg 42.9 kg    Intake/Output: I/O last 3 completed shifts: In: 1269.4 [P.O.:480; I.V.:80.4; Blood:709] Out: 253 [Urine:800]   Intake/Output  this shift:  Total I/O In: 236 [P.O.:236] Out: -   Frail lady nondistressed  CVS-sinus rhythm murmur 3/6 left sternal edge RS-lung fields essentially clear poor air effort with diminished breath sounds at bases ABD- BS present soft non-distended EXT- no edema   Basic Metabolic Panel: Recent Labs  Lab 05/06/18 1655 05/07/18 1146 05/08/18 0223  NA 140 139 139  K 4.3 3.0* 3.2*  CL 106 103 102  CO2 18* 25 28  GLUCOSE 90 113* 105*  BUN 138* 53* 25*  CREATININE 4.82* 2.03* 2.33*  CALCIUM 8.2* 7.5* 8.1*  PHOS  --  2.0* 4.2    Liver Function Tests: Recent Labs  Lab 05/07/18 1146 05/08/18 0223  ALBUMIN 2.5* 2.5*   No results for input(s): LIPASE, AMYLASE in the last 168 hours. Recent Labs  Lab 05/08/18 0223  AMMONIA 21    CBC: Recent Labs  Lab 05/06/18 1655 05/07/18 0914 05/07/18 1146 05/07/18 1756 05/08/18 0223  WBC 7.7  --  10.5  --  8.0  HGB 5.6* 6.0* 6.2* 7.6* 7.4*  HCT 18.3* 18.4* 18.2* 22.7* 21.7*  MCV 97.9  --  94.8  --  91.9  PLT 220  --  174  --  138*    Cardiac Enzymes: No results for input(s): CKTOTAL, CKMB, CKMBINDEX, TROPONINI in the last 168 hours.  BNP: Invalid input(s): POCBNP  CBG: Recent Labs  Lab 05/06/18 1546  GLUCAP 74    Microbiology: No results found for this or any previous visit.  Coagulation Studies: No results for input(s): LABPROT, INR  in the last 72 hours.  Urinalysis: No results for input(s): COLORURINE, LABSPEC, PHURINE, GLUCOSEU, HGBUR, BILIRUBINUR, KETONESUR, PROTEINUR, UROBILINOGEN, NITRITE, LEUKOCYTESUR in the last 72 hours.  Invalid input(s): APPERANCEUR    Imaging: No results found.   Medications:   . diltiazem (CARDIZEM) infusion 5 mg/hr (05/07/18 1633)   . sodium chloride   Intravenous Once  . sodium chloride   Intravenous Once  . allopurinol  100 mg Oral Daily  . calcitRIOL  0.5 mcg Oral Q T,Th,Sa-HD  . Chlorhexidine Gluconate Cloth  6 each Topical Q0600  . Chlorhexidine Gluconate Cloth  6  each Topical Q0600  . darbepoetin (ARANESP) injection - DIALYSIS  200 mcg Intravenous Q Fri-HD  . dicyclomine  10 mg Oral TID AC & HS  . gabapentin  300 mg Oral QHS  . labetalol  200 mg Oral BID  . multivitamin  1 tablet Oral QHS  . pantoprazole  40 mg Oral Q0600  . pravastatin  80 mg Oral QHS  . sevelamer carbonate  2,400 mg Oral TID WC   HYDROcodone-acetaminophen, ipratropium-albuterol, LORazepam, ondansetron (ZOFRAN) IV  Assessment/ Plan:   ESRD-Tuesday Thursday Saturday dialysis plan treatment for 11/2018.  Noncompliant with dialysis treatments  ANEMIA-status post acute blood loss anemia secondary to mucosal oozing.  Appreciate assistance from Dr. Scarlette Shorts status post transfusion 2 units packed red blood cells hemoglobin above 7 continue ESA's and iron with dialysis  MBD-continues on calcitriol 0.5 mcg 3 times a week  HTN/VOL-does not have appear to have a problem with hypervolemia  ACCESS-developing AV fistula left forearm  Atrial fibrillation not a candidate for chronic anticoagulation  Chronic abdominal pain secondary to history of recurrent small bowel obstructions  DNR status  History of coronary disease holding Plavix and aspirin due to high risk of bleeding  History of substance abuse cocaine marijuana and alcohol   LOS: 2 Sherril Croon @TODAY @10 :01 AM

## 2018-05-08 NOTE — Progress Notes (Signed)
PROGRESS NOTE    Christina [name]  GNO:[id number] DOB: April 23, 1950 DOA: 05/06/2018 PCP: Maggie Schwalbe, PA-C   Brief Narrative: 68 year old with past medical significant for end-stage renal disease on hemodialysis she has a chest wall dialysis catheter, CVA without deficits, coronary artery disease a status post a STEMI 2010 status post PCI, carotid endarterectomy, hypertension, hyperlipidemia COPD, bipolar who presented to [facility] complaining of generalized weakness, lightheadedness, dizziness while attempting to go to dialysis.  Patient was found to have a hemoglobin at 5.  She reports melena.  She was transferred from [place] to [facility] for GI evaluation.  Overnight patient developed A. fib with RVR.  She was a started on Cardizem drip.  She has received 1 unit of packed red blood cell.  Repeated hemoglobin at 6.  Will transfuse second unit of packed red blood cell.   Assessment & Plan:   Principal Problem:   Acute GI bleeding Active Problems:   ESRD (end stage renal disease) (HCC)   Anxiety   Bipolar affective (HCC)   CAD (coronary artery disease), native coronary artery   COPD with chronic bronchitis (HCC)   CVA (cerebral vascular accident) ([place])   Essential hypertension   Hyperlipidemia   Anemia due to chronic blood loss   Heme positive stool   Platelet inhibition due to Plavix   1-Acute blood loss anemia; symptomatic also component anemia of chronic diseases. Present with a hemoglobin at 5.  Reports melena.  Although she has been taking oral iron. She has received 2 units PRBC during his admission. One unit at [place].  GI has been consulted. Recommending PPI. Follow up with primary gastroenterologist.   2-Acute GI bleed, melena: Hemoglobin at 5 on admission. GI consulted. Recommending PPI. Follow up with primary gastroenterologist out patient.  Patient also reported vomiting coffee ground emesis at prior hospital. No vomiting here. Will continue to  monitor.  Hb today at 7.4.  She will received Aranesp.   3-End-Stage Renal Disease on hemodialysis: Nephrology has been consulted. Had HD 4-10.  4-A. fib with RVR: We will would not start anticoagulation due to GI bleed and low hemoglobin.  Discontinue Norvasc to allow high blood pressure. Continue with labetalol Will transition form Cardizem drip to oral.   5-Hypertension: Blood pressure soft in the setting of GI bleed.  Discontinue Norvasc, Imdur.  6- Coronary artery disease history of PCI 2010: Hold aspirin and Plavix due to severe anemia. I will hold indoor due to hypotension.  Acute metabolic encephalopathy;  Patient is more alert today. She was very confuse yesterday.   Anxiety/depression/bipolar disorder -Continue home medications  History of CVA without deficits: Hold aspirin and Plavix.   Recurrent Bowel obstruction in 01/2018 and underwent ex lap, LOA, SB resection Dr Corena Pilgrim at [facility].  There is no height or weight on file to calculate BMI.   DVT prophylaxis: SCD Code Status: Full code Family Communication: Patient decline that I call her family for updates.  Disposition Plan: remain in the hospital for blood transfusion and evaluation of GI bleed.   Consultants:   Nephrology  Gastroenterologist    Procedures:   HD  Antimicrobials: none  Subjective: She is alert today. Less confuse. Mild abdominal pain, chronic since after sx.    Objective: Vitals:   05/07/18 1650 05/07/18 1942 05/08/18 0500 05/08/18 0851  BP: (!) 154/77  134/75 (!) 152/78  Pulse: 82  78 75  Resp: 16   14  Temp: 98.3 F (36.8 C) 97.8 F (36.6 C)  98 F (36.7 C) 97.9 F (36.6 C)  TempSrc: Oral Oral Oral Oral  SpO2: 100% 98%  95%  Weight:        Intake/Output Summary (Last 24 hours) at 05/08/2018 1034 Last data filed at 05/08/2018 0724 Gross per 24 hour  Intake 1091 ml  Output -347 ml  Net 1438 ml   Filed Weights   05/07/18 0219 05/07/18 1029 05/07/18  1445  Weight: 42.2 kg 42.4 kg 42.9 kg    Examination:  General exam: NAD Respiratory system: CTA. Cardiovascular system: S 1, S 2 RRR Gastrointestinal system: BS present, soft, nt, midline incision healing Central nervous system: alert, oriented Extremities: Symmetric power.    Data Reviewed: I have personally reviewed following labs and imaging studies  CBC: Recent Labs  Lab 05/06/18 1655 05/07/18 0914 05/07/18 1146 05/07/18 1756 05/08/18 0223  WBC 7.7  --  10.5  --  8.0  HGB 5.6* 6.0* 6.2* 7.6* 7.4*  HCT 18.3* 18.4* 18.2* 22.7* 21.7*  MCV 97.9  --  94.8  --  91.9  PLT 220  --  174  --  161*   Basic Metabolic Panel: Recent Labs  Lab 05/06/18 1655 05/07/18 1146 05/08/18 0223  NA 140 139 139  K 4.3 3.0* 3.2*  CL 106 103 102  CO2 18* 25 28  GLUCOSE 90 113* 105*  BUN 138* 53* 25*  CREATININE 4.82* 2.03* 2.33*  CALCIUM 8.2* 7.5* 8.1*  PHOS  --  2.0* 4.2   GFR: CrCl cannot be calculated (Unknown ideal weight.). Liver Function Tests: Recent Labs  Lab 05/07/18 1146 05/08/18 0223  ALBUMIN 2.5* 2.5*   No results for input(s): LIPASE, AMYLASE in the last 168 hours. Recent Labs  Lab 05/08/18 0223  AMMONIA 21   Coagulation Profile: No results for input(s): INR, PROTIME in the last 168 hours. Cardiac Enzymes: No results for input(s): CKTOTAL, CKMB, CKMBINDEX, TROPONINI in the last 168 hours. BNP (last 3 results) No results for input(s): PROBNP in the last 8760 hours. HbA1C: No results for input(s): HGBA1C in the last 72 hours. CBG: Recent Labs  Lab 05/06/18 1546  GLUCAP 74   Lipid Profile: No results for input(s): CHOL, HDL, LDLCALC, TRIG, CHOLHDL, LDLDIRECT in the last 72 hours. Thyroid Function Tests: No results for input(s): TSH, T4TOTAL, FREET4, T3FREE, THYROIDAB in the last 72 hours. Anemia Panel: No results for input(s): VITAMINB12, FOLATE, FERRITIN, TIBC, IRON, RETICCTPCT in the last 72 hours. Sepsis Labs: No results for input(s):  PROCALCITON, LATICACIDVEN in the last 168 hours.  No results found for this or any previous visit (from the past 240 hour(s)).       Radiology Studies: No results found.      Scheduled Meds: . sodium chloride   Intravenous Once  . sodium chloride   Intravenous Once  . allopurinol  100 mg Oral Daily  . calcitRIOL  0.5 mcg Oral Q T,Th,Sa-HD  . Chlorhexidine Gluconate Cloth  6 each Topical Q0600  . Chlorhexidine Gluconate Cloth  6 each Topical Q0600  . darbepoetin (ARANESP) injection - DIALYSIS  200 mcg Intravenous Q Fri-HD  . dicyclomine  10 mg Oral TID AC & HS  . diltiazem  30 mg Oral Q8H  . gabapentin  300 mg Oral QHS  . labetalol  200 mg Oral BID  . multivitamin  1 tablet Oral QHS  . pantoprazole  40 mg Oral BID  . pravastatin  80 mg Oral QHS  . sevelamer carbonate  2,400 mg Oral TID WC  Continuous Infusions:    LOS: 2 days    Time spent: 35 minutes.     Elmarie Shiley, MD [facility] Pager [phone]  If 7PM-7AM, please contact night-coverage [email or web address] Password [facility] 05/08/2018, 10:34 AM

## 2018-05-09 LAB — CBC
HCT: 25.2 % — ABNORMAL LOW (ref 36.0–46.0)
Hemoglobin: 8.3 g/dL — ABNORMAL LOW (ref 12.0–15.0)
MCH: 31.4 pg (ref 26.0–34.0)
MCHC: 32.9 g/dL (ref 30.0–36.0)
MCV: 95.5 fL (ref 80.0–100.0)
Platelets: 141 10*3/uL — ABNORMAL LOW (ref 150–400)
RBC: 2.64 MIL/uL — ABNORMAL LOW (ref 3.87–5.11)
RDW: 18.7 % — ABNORMAL HIGH (ref 11.5–15.5)
WBC: 6 10*3/uL (ref 4.0–10.5)
nRBC: 0.3 % — ABNORMAL HIGH (ref 0.0–0.2)

## 2018-05-09 LAB — RENAL FUNCTION PANEL
Albumin: 2.7 g/dL — ABNORMAL LOW (ref 3.5–5.0)
Anion gap: 8 (ref 5–15)
BUN: 7 mg/dL — ABNORMAL LOW (ref 8–23)
CO2: 26 mmol/L (ref 22–32)
Calcium: 8.5 mg/dL — ABNORMAL LOW (ref 8.9–10.3)
Chloride: 100 mmol/L (ref 98–111)
Creatinine, Ser: 1.86 mg/dL — ABNORMAL HIGH (ref 0.44–1.00)
GFR calc Af Amer: 32 mL/min — ABNORMAL LOW (ref >60)
GFR calc non Af Amer: 27 mL/min — ABNORMAL LOW (ref >60)
Glucose, Bld: 108 mg/dL — ABNORMAL HIGH (ref 70–99)
Phosphorus: 3.4 mg/dL (ref 2.5–4.6)
Potassium: 4 mmol/L (ref 3.5–5.1)
Sodium: 134 mmol/L — ABNORMAL LOW (ref 135–145)

## 2018-05-09 LAB — FERRITIN: Ferritin: 296 ng/mL (ref 11–307)

## 2018-05-09 MED ORDER — PANTOPRAZOLE SODIUM 40 MG PO TBEC: 40.0000 mg | DELAYED_RELEASE_TABLET | Freq: Two times a day (BID) | ORAL | 0 refills | Status: DC

## 2018-05-09 MED ORDER — POLYETHYLENE GLYCOL 3350 17 G PO PACK: 17.0000 g | PACK | Freq: Every day | ORAL | 0 refills | Status: DC

## 2018-05-09 MED ORDER — SODIUM CHLORIDE 0.9 % IV SOLN
510.0000 mg | Freq: Once | INTRAVENOUS | Status: AC
  Administered 2018-05-09: 510 mg via INTRAVENOUS
  Filled 2018-05-09: qty 17

## 2018-05-09 NOTE — Plan of Care (Signed)
Care plans reviewed and patient is progressing.  

## 2018-05-09 NOTE — Progress Notes (Signed)
Discharge instructions given to Mrs Keddy.  Discussed medication changes, new medications and side effects.  Discussed follow up appointments and activities.  Discussed signs and symptoms to watch for and when to contact the physician.  Verbalized understanding.

## 2018-05-09 NOTE — Discharge Summary (Signed)
Physician Discharge Summary  Terryl Niziolek IOE:[id number] DOB: 09/23/1950 DOA: 05/06/2018  PCP: Maggie Schwalbe, PA-C  Admit date: 05/06/2018 Discharge date: 05/09/2018  Admitted From: Home  Disposition: Home  Recommendations for Outpatient Follow-up:  1. Follow up with PCP in 1-2 weeks 2. Please obtain BMP/CBC in one week 3. She will need Aranesp with HD and Iron  IV as needed 4. Continue to Hold aspirin and Plavix due to severe anemia. Needs to follow up with PCP and Primary GI to determine timing to start anticoagulation.   Home Health: none  Discharge Condition: stable.  CODE STATUS: Full code Diet recommendation: Heart Healthy  Brief/Interim Summary:  68 year old with past medical significant for end-stage renal disease on hemodialysis she has a chest wall dialysis catheter, CVA without deficits, coronary artery disease a status post a STEMI 2010 status post PCI, carotid endarterectomy, hypertension, hyperlipidemia COPD, bipolar who presented to [facility] complaining of generalized weakness, lightheadedness, dizziness while attempting to go to dialysis.  Patient was found to have a hemoglobin at 5.  She reports melena.  She was transferred from [place] to [facility] for GI evaluation.  Overnight patient developed A. fib with RVR.  She was a started on Cardizem drip.  She has received 1 unit of packed red blood cell.  Repeated hemoglobin at 6.  Will transfuse second unit of packed red blood cell.   1-Acute blood loss anemia; symptomatic also component anemia of chronic diseases. Present with a hemoglobin at 5.  Reports melena.  Although she has been taking oral iron. She has received 2 units PRBC during his admission. One unit at [place].  GI has been consulted. Recommending PPI. Follow up with primary gastroenterologist.  hb stable to day at 8.   2-Acute GI bleed, melena: Hemoglobin at 5 on admission. GI consulted. Recommending PPI. Follow up with primary  gastroenterologist out patient.  Patient also reported vomiting coffee ground emesis at prior hospital. No vomiting here. Will continue to monitor.  Hb today at 7.4.  received Aranesp. will give IV iron today. Hb stable at 8 Discharge on PPI BID>  Needs to follow up with primary GI.   3-End-Stage Renal Disease on hemodialysis: Nephrology has been consulted. Had HD 4-11.  4-A. fib with RVR: We will would not start anticoagulation due to GI bleed and low hemoglobin.  Discontinue Norvasc to allow high blood pressure. Continue with labetalol Will discontinue cardizem, only got one dose oral yesterday. HR stable.   5-Hypertension: Blood pressure soft in the setting of GI bleed.  Discontinue Norvasc, Imdur.  6- Coronary artery disease history of PCI 2010: Hold aspirin and Plavix due to severe anemia. Needs to follow up with PCP and Primary GI to determine timing to start anticoagulation.  Resume Imdur.   Acute metabolic encephalopathy;  Patient is more alert today. She was very confuse yesterday.  Back to baseline.   Anxiety/depression/bipolar disorder -Continue home medications.   History of CVA without deficits: Hold aspirin and Plavix.  Recurrent Bowel obstruction in 01/2018 and underwent ex lap, LOA, SB resection Dr Corena Pilgrim at [facility]. stable.   Discharge Diagnoses:  Principal Problem:   Acute GI bleeding Active Problems:   ESRD (end stage renal disease) (HCC)   Anxiety   Bipolar affective (HCC)   CAD (coronary artery disease), native coronary artery   COPD with chronic bronchitis (HCC)   CVA (cerebral vascular accident) ([place])   Essential hypertension   Hyperlipidemia   Anemia due to chronic blood loss  Heme positive stool   Platelet inhibition due to Plavix    Discharge Instructions  Discharge Instructions    Diet - low sodium heart healthy   Complete by:  As directed    Increase activity slowly   Complete by:  As directed      Allergies  as of 05/09/2018      Reactions   Cyclobenzaprine Anaphylaxis, Other (See Comments)   Unknown "stopped heart"   Morphine Anaphylaxis   Morphine derivatives Unknown   Penicillins Other (See Comments), Shortness Of Breath, Swelling   Unknown   Hydromorphone Other (See Comments)   Unknown   Codeine Itching, Rash   Codeine derivatives Unknown      Medication List    STOP taking these medications   aspirin EC 81 MG tablet   clopidogrel 75 MG tablet Commonly known as:  PLAVIX   omeprazole 40 MG capsule Commonly known as:  PRILOSEC     TAKE these medications   allopurinol 100 MG tablet Commonly known as:  ZYLOPRIM Take 100 mg by mouth daily.   amLODipine 10 MG tablet Commonly known as:  NORVASC Take 10 mg by mouth at bedtime.   dicyclomine 10 MG capsule Commonly known as:  BENTYL Take 10 mg by mouth 4 (four) times daily. Before meals and nightly   gabapentin 300 MG capsule Commonly known as:  NEURONTIN Take 300 mg by mouth at bedtime.   Ipratropium-Albuterol 20-100 MCG/ACT Aers respimat Commonly known as:  COMBIVENT Inhale 2 puffs into the lungs 4 (four) times daily as needed for wheezing.   isosorbide mononitrate 30 MG 24 hr tablet Commonly known as:  IMDUR Take 30 mg by mouth daily.   labetalol 200 MG tablet Commonly known as:  NORMODYNE Take 200 mg by mouth 2 (two) times daily.   multivitamin Tabs tablet Take 1 tablet by mouth daily.   nitroGLYCERIN 0.4 MG SL tablet Commonly known as:  NITROSTAT Place 0.4 mg under the tongue every 5 (five) minutes as needed for chest pain.   oxyCODONE-acetaminophen 5-325 MG tablet Commonly known as:  PERCOCET/ROXICET Take 1 tablet by mouth every 6 (six) hours as needed for pain.   pantoprazole 40 MG tablet Commonly known as:  PROTONIX Take 1 tablet (40 mg total) by mouth 2 (two) times daily.   polyethylene glycol 17 g packet Commonly known as:  MIRALAX / GLYCOLAX Take 17 g by mouth daily.   pravastatin 80 MG  tablet Commonly known as:  PRAVACHOL Take 80 mg by mouth at bedtime.   sevelamer carbonate 800 MG tablet Commonly known as:  RENVELA Take 2,400 mg by mouth 3 (three) times daily.   Vitamin D3 50 MCG (2000 UT) capsule Take 2,000 Units by mouth every morning.       Allergies  Allergen Reactions  . Cyclobenzaprine Anaphylaxis and Other (See Comments)    Unknown "stopped heart"   . Morphine Anaphylaxis    Morphine derivatives Unknown   . Penicillins Other (See Comments), Shortness Of Breath and Swelling    Unknown   . Hydromorphone Other (See Comments)    Unknown  . Codeine Itching and Rash    Codeine derivatives Unknown     Consultations:  GI  Nephrology   Procedures/Studies:  No results found.  Subjective: Feeling well, has abdominal pain from not been able to et regular food.   Discharge Exam: Vitals:   05/08/18 1949 05/09/18 0544  BP: (!) 153/66 (!) 146/72  Pulse: 78 75  Resp: 16 13  Temp: 98.4 F (36.9 C) 97.7 F (36.5 C)  SpO2: 98% 100%     General: Pt is alert, awake, not in acute distress Cardiovascular: RRR, S1/S2 +, no rubs, no gallops Respiratory: CTA bilaterally, no wheezing, no rhonchi Abdominal: Soft, NT, ND, bowel sounds + Extremities: no edema, no cyanosis    The results of significant diagnostics from this hospitalization (including imaging, microbiology, ancillary and laboratory) are listed below for reference.     Microbiology: No results found for this or any previous visit (from the past 240 hour(s)).   Labs: BNP (last 3 results) No results for input(s): BNP in the last 8760 hours. Basic Metabolic Panel: Recent Labs  Lab 05/06/18 1655 05/07/18 1146 05/08/18 0223 05/09/18 0340  NA 140 139 139 134*  K 4.3 3.0* 3.2* 4.0  CL 106 103 102 100  CO2 18* 25 28 26   GLUCOSE 90 113* 105* 108*  BUN 138* 53* 25* 7*  CREATININE 4.82* 2.03* 2.33* 1.86*  CALCIUM 8.2* 7.5* 8.1* 8.5*  PHOS  --  2.0* 4.2 3.4   Liver Function  Tests: Recent Labs  Lab 05/07/18 1146 05/08/18 0223 05/09/18 0340  ALBUMIN 2.5* 2.5* 2.7*   No results for input(s): LIPASE, AMYLASE in the last 168 hours. Recent Labs  Lab 05/08/18 0223  AMMONIA 21   CBC: Recent Labs  Lab 05/06/18 1655 05/07/18 0914 05/07/18 1146 05/07/18 1756 05/08/18 0223 05/09/18 0340  WBC 7.7  --  10.5  --  8.0 6.0  HGB 5.6* 6.0* 6.2* 7.6* 7.4* 8.3*  HCT 18.3* 18.4* 18.2* 22.7* 21.7* 25.2*  MCV 97.9  --  94.8  --  91.9 95.5  PLT 220  --  174  --  138* 141*   Cardiac Enzymes: No results for input(s): CKTOTAL, CKMB, CKMBINDEX, TROPONINI in the last 168 hours. BNP: Invalid input(s): POCBNP CBG: Recent Labs  Lab 05/06/18 1546  GLUCAP 74   D-Dimer No results for input(s): DDIMER in the last 72 hours. Hgb A1c No results for input(s): HGBA1C in the last 72 hours. Lipid Profile No results for input(s): CHOL, HDL, LDLCALC, TRIG, CHOLHDL, LDLDIRECT in the last 72 hours. Thyroid function studies No results for input(s): TSH, T4TOTAL, T3FREE, THYROIDAB in the last 72 hours.  Invalid input(s): FREET3 Anemia work up Recent Labs    05/08/18 1054 05/09/18 0340  FERRITIN  --  296  TIBC 302  --   IRON 31  --    Urinalysis No results found for: COLORURINE, APPEARANCEUR, LABSPEC, [place], GLUCOSEU, HGBUR, BILIRUBINUR, KETONESUR, PROTEINUR, UROBILINOGEN, NITRITE, LEUKOCYTESUR Sepsis Labs Invalid input(s): PROCALCITONIN,  WBC,  LACTICIDVEN Microbiology No results found for this or any previous visit (from the past 240 hour(s)).   Time coordinating discharge: 40 minutes  SIGNED:   Elmarie Shiley, MD  [facility]

## 2018-05-09 NOTE — Progress Notes (Signed)
[facility] ROUNDING NOTE   Subjective:   68 year old lady end-stage renal disease Tuesday Thursday Saturday [place].  She has a history of CVA without deficit history of coronary artery disease status post PCI and STEMI in 2010.  She has a history of carotid endarterectomy and COPD hypertension hyperlipidemia bipolar disorder.  She was admitted with GI bleed.  She received 1 unit packed red blood cells on admission.  Hemoglobin 6.  Remote history of peptic ulcer disease.  Previous colonoscopy and upper endoscopy have been unremarkable.  Appreciate assistance from Dr. Scarlette Shorts.  She is followed by Dr. Rush Landmark.  It appears that her problems are chronic and multifactorial.  Daily oral PPIs have been recommended with outpatient follow-up.  She underwent dialysis 05/08/2018 with removal of 2,796 cc    Blood pressure 146/72 pulse 75 temperature 97.5 O2 sats 100%  Allopurinol 100 mg daily, diltiazem drip, labetalol 200 mg twice daily, pravastatin 80 mg daily Calcitrol 0.5 mcg Tuesday Thursday Saturday, Bentyl 10 mg 3 times daily, Protonix 40 mg daily, sevelamer 2.4 g 3 times daily with meals, Aranesp 200 mcg IV weekly, gabapentin 300 mg nightly  Status post transfusion 05/07/2018 2 units packed red blood cells   Sodium 134 potassium 4.0 chloride 100 CO2 26 BUN 7 creatinine 1.86 glucose 108 phosphorus 3.4 albumin 2.7 calcium 8.5 WBC 6.0 hemoglobin 8.3 platelets 141    Objective:  Vital signs in last 24 hours:  Temp:  [97.7 F (36.5 C)-98.4 F (36.9 C)] 97.7 F (36.5 C) (04/12 0544) Pulse Rate:  [66-78] 75 (04/12 0544) Resp:  [13-20] 13 (04/12 0544) BP: (113-159)/(44-72) 146/72 (04/12 0544) SpO2:  [96 %-100 %] 100 % (04/12 0544) Weight:  [40.4 kg-42.6 kg] 40.4 kg (04/11 1600)  Weight change: 0.2 kg Filed Weights   05/07/18 1445 05/08/18 1153 05/08/18 1600  Weight: 42.9 kg 42.6 kg 40.4 kg    Intake/Output: I/O last 3 completed shifts: In: 4656 [P.O.:1277;  I.V.:60] Out: 8127 [Urine:1925; Other:2796]   Intake/Output this shift:  No intake/output data recorded.  Frail lady nondistressed  CVS-sinus rhythm murmur 3/6 left sternal edge RS-lung fields essentially clear poor air effort with diminished breath sounds at bases ABD- BS present soft non-distended EXT- no edema   Basic Metabolic Panel: Recent Labs  Lab 05/06/18 1655 05/07/18 1146 05/08/18 0223 05/09/18 0340  NA 140 139 139 134*  K 4.3 3.0* 3.2* 4.0  CL 106 103 102 100  CO2 18* 25 28 26   GLUCOSE 90 113* 105* 108*  BUN 138* 53* 25* 7*  CREATININE 4.82* 2.03* 2.33* 1.86*  CALCIUM 8.2* 7.5* 8.1* 8.5*  PHOS  --  2.0* 4.2 3.4    Liver Function Tests: Recent Labs  Lab 05/07/18 1146 05/08/18 0223 05/09/18 0340  ALBUMIN 2.5* 2.5* 2.7*   No results for input(s): LIPASE, AMYLASE in the last 168 hours. Recent Labs  Lab 05/08/18 0223  AMMONIA 21    CBC: Recent Labs  Lab 05/06/18 1655 05/07/18 0914 05/07/18 1146 05/07/18 1756 05/08/18 0223 05/09/18 0340  WBC 7.7  --  10.5  --  8.0 6.0  HGB 5.6* 6.0* 6.2* 7.6* 7.4* 8.3*  HCT 18.3* 18.4* 18.2* 22.7* 21.7* 25.2*  MCV 97.9  --  94.8  --  91.9 95.5  PLT 220  --  174  --  138* 141*    Cardiac Enzymes: No results for input(s): CKTOTAL, CKMB, CKMBINDEX, TROPONINI in the last 168 hours.  BNP: Invalid input(s): POCBNP  CBG: Recent Labs  Lab  05/06/18 [place]    Microbiology: No results found for this or any previous visit.  Coagulation Studies: No results for input(s): LABPROT, INR in the last 72 hours.  Urinalysis: No results for input(s): COLORURINE, LABSPEC, PHURINE, GLUCOSEU, HGBUR, BILIRUBINUR, KETONESUR, PROTEINUR, UROBILINOGEN, NITRITE, LEUKOCYTESUR in the last 72 hours.  Invalid input(s): APPERANCEUR    Imaging: No results found.   Medications:   . ferumoxytol     . sodium chloride   Intravenous Once  . sodium chloride   Intravenous Once  . allopurinol  100 mg Oral Daily  .  calcitRIOL  0.5 mcg Oral Q T,Th,Sa-HD  . Chlorhexidine Gluconate Cloth  6 each Topical Q0600  . Chlorhexidine Gluconate Cloth  6 each Topical Q0600  . darbepoetin (ARANESP) injection - DIALYSIS  200 mcg Intravenous Q Fri-HD  . dicyclomine  10 mg Oral TID AC & HS  . gabapentin  300 mg Oral QHS  . labetalol  200 mg Oral BID  . multivitamin  1 tablet Oral QHS  . pantoprazole  40 mg Oral BID  . polyethylene glycol  17 g Oral Daily  . pravastatin  80 mg Oral QHS  . sevelamer carbonate  2,400 mg Oral TID WC   diphenhydrAMINE, HYDROcodone-acetaminophen, ipratropium-albuterol, LORazepam, ondansetron (ZOFRAN) IV  Assessment/ Plan:   ESRD-Tuesday Thursday Saturday dialysis plan treatment for 11/2018.  Noncompliant with dialysis treatments  ANEMIA-status post acute blood loss anemia secondary to mucosal oozing.  Appreciate assistance from Dr. Scarlette Shorts status post transfusion 2 units packed red blood cells hemoglobin above 7 continue ESA's and iron with dialysis  MBD-continues on calcitriol 0.5 mcg 3 times a week  HTN/VOL-does not have appear to have a problem with hypervolemia  ACCESS-developing AV fistula left forearm  Atrial fibrillation not a candidate for chronic anticoagulation  Chronic abdominal pain secondary to history of recurrent small bowel obstructions  DNR status  History of coronary disease holding Plavix and aspirin due to high risk of bleeding.  We will not use any antiplatelets or further anticoagulation.  High risk of bleeding.  History of substance abuse cocaine marijuana and alcohol   LOS: 3 Sherril Croon @TODAY @9 :46 AM

## 2018-05-09 NOTE — Plan of Care (Signed)
Patient is adequate for discharge.  

## 2018-07-12 ENCOUNTER — Other Ambulatory Visit: Payer: Self-pay

## 2018-07-12 DIAGNOSIS — N186 End stage renal disease: Secondary | ICD-10-CM

## 2018-07-16 ENCOUNTER — Other Ambulatory Visit: Payer: Self-pay

## 2018-07-16 ENCOUNTER — Telehealth (HOSPITAL_COMMUNITY): Payer: Self-pay

## 2018-07-16 ENCOUNTER — Encounter: Payer: Self-pay | Admitting: Vascular Surgery

## 2018-07-16 ENCOUNTER — Encounter: Payer: Self-pay | Admitting: *Deleted

## 2018-07-16 ENCOUNTER — Ambulatory Visit (INDEPENDENT_AMBULATORY_CARE_PROVIDER_SITE_OTHER): Payer: Medicare Other | Admitting: Vascular Surgery

## 2018-07-16 ENCOUNTER — Ambulatory Visit (HOSPITAL_COMMUNITY)
Admission: RE | Admit: 2018-07-16 | Discharge: 2018-07-16 | Disposition: A | Discharge status: Home / Self Care | Payer: Medicare Other | Source: Ambulatory Visit | Attending: Vascular Surgery | Admitting: Vascular Surgery

## 2018-07-16 ENCOUNTER — Other Ambulatory Visit: Payer: Self-pay | Admitting: *Deleted

## 2018-07-16 ENCOUNTER — Ambulatory Visit (INDEPENDENT_AMBULATORY_CARE_PROVIDER_SITE_OTHER)
Admission: RE | Admit: 2018-07-16 | Discharge: 2018-07-16 | Disposition: A | Discharge status: Home / Self Care | Payer: Medicare Other | Source: Ambulatory Visit | Attending: Vascular Surgery | Admitting: Vascular Surgery

## 2018-07-16 VITALS — BP 155/88 | HR 87 | Temp 98.5°F | Resp 20 | Ht 61.0 in | Wt 97.0 lb

## 2018-07-16 DIAGNOSIS — N186 End stage renal disease: Secondary | ICD-10-CM | POA: Diagnosis present

## 2018-07-16 NOTE — Progress Notes (Signed)
Patient ID: Christina [name], female   DOB: 1950-11-29, 68 y.o.   MRN: [id number]  Reason for Consult: New Patient (Initial Visit)   Referred by Nodal, [name], PA-C  Subjective:     HPI:  Christina [name] is a 68 y.o. female currently on dialysis Tuesday Thursdays and Saturdays and [place].  Currently dialyzing via catheter which she says is having some issues.  Does not take blood thinners.  Has previous left radiocephalic AV fistula that failed.  Now here for consideration of new access.  She is right-hand dominant.  Never had chest breast or arm surgery on the left.  Past Medical History:  Diagnosis Date  . Chronic kidney disease   . Gout   . Hyperlipidemia   . Hypertension    History reviewed. No pertinent family history. Past Surgical History:  Procedure Laterality Date  . ABDOMINAL HYSTERECTOMY    . APPENDECTOMY    . HERNIA REPAIR      Short Social History:  Social History   Tobacco Use  . Smoking status: Former Research scientist (life sciences)  . Smokeless tobacco: Never Used  Substance Use Topics  . Alcohol use: Not Currently    Allergies  Allergen Reactions  . Cyclobenzaprine Anaphylaxis and Other (See Comments)    Unknown "stopped heart"   . Morphine Anaphylaxis    Morphine derivatives Unknown   . Penicillins Other (See Comments), Shortness Of Breath and Swelling    Unknown   . Hydromorphone Other (See Comments)    Unknown  . Codeine Itching and Rash    Codeine derivatives Unknown     Current Outpatient Medications  Medication Sig Dispense Refill  . allopurinol (ZYLOPRIM) 100 MG tablet Take 100 mg by mouth daily.    Marland Kitchen amLODipine (NORVASC) 10 MG tablet Take 10 mg by mouth at bedtime.    . Cholecalciferol (VITAMIN D3) 50 MCG (2000 UT) capsule Take 2,000 Units by mouth every morning.    . dicyclomine (BENTYL) 10 MG capsule Take 10 mg by mouth 4 (four) times daily. Before meals and nightly    . gabapentin (NEURONTIN) 300 MG capsule Take 300 mg by mouth at bedtime.     . Ipratropium-Albuterol (COMBIVENT) 20-100 MCG/ACT AERS respimat Inhale 2 puffs into the lungs 4 (four) times daily as needed for wheezing.    . isosorbide mononitrate (IMDUR) 30 MG 24 hr tablet Take 30 mg by mouth daily.    Marland Kitchen labetalol (NORMODYNE) 200 MG tablet Take 200 mg by mouth 2 (two) times daily.    . multivitamin ([name]-VIT) TABS tablet Take 1 tablet by mouth daily.    . nitroGLYCERIN (NITROSTAT) 0.4 MG SL tablet Place 0.4 mg under the tongue every 5 (five) minutes as needed for chest pain.     . pantoprazole (PROTONIX) 40 MG tablet Take 1 tablet (40 mg total) by mouth 2 (two) times daily. 60 tablet 0  . polyethylene glycol (MIRALAX / GLYCOLAX) 17 g packet Take 17 g by mouth daily. 14 each 0  . pravastatin (PRAVACHOL) 80 MG tablet Take 80 mg by mouth at bedtime.    . sevelamer carbonate (RENVELA) 800 MG tablet Take 2,400 mg by mouth 3 (three) times daily.     No current facility-administered medications for this visit.     Review of Systems  Constitutional:  Constitutional negative. HENT: HENT negative.  Eyes: Eyes negative.  Respiratory: Respiratory negative.  Cardiovascular: Cardiovascular negative.  GI: Gastrointestinal negative.  Musculoskeletal: Musculoskeletal negative.  Skin: Skin negative.  Neurological: Neurological negative. Hematologic:  Hematologic/lymphatic negative.  Psychiatric: Psychiatric negative.        Objective:   Vitals:   07/16/18 1142  BP: (!) 155/88  Pulse: 87  Resp: 20  Temp: 98.5 F (36.9 C)  SpO2: 100%    Physical Exam HENT:     Head: Normocephalic.     Mouth/Throat:     Mouth: Mucous membranes are moist.  Eyes:     Pupils: Pupils are equal, round, and reactive to light.  Cardiovascular:     Rate and Rhythm: Normal rate.     Pulses:          Radial pulses are 2+ on the right side and 2+ on the left side.  Pulmonary:     Effort: Pulmonary effort is normal.  Abdominal:     General: Abdomen is flat.  Musculoskeletal: Normal range  of motion.        General: No swelling.  Skin:    General: Skin is warm and dry.  Neurological:     General: No focal deficit present.     Mental Status: She is alert.  Psychiatric:        Mood and Affect: Mood normal.        Behavior: Behavior normal.        Thought Content: Thought content normal.        Judgment: Judgment normal.     Data: I have independently interpreted her bilateral upper extremity vein mapping.  She really has quite diminutive veins bilaterally.  The right basilic vein does measure up to 0.43 cm in the upper arm but is small at the antecubital fossa.  The left basilic vein is diminutive up into the level of the upper arm.  Neither of her cephalic veins appear to be suitable for dialysis access.  I have also independently interpreted her bilateral upper extremity arterial duplex bilateral brachial arteries measure 0.4 cm.     Assessment/Plan:     68 year old female with previous left forearm dialysis access.  She is now in need of new access.  Veins bilaterally are marginal for access.  We will plan left arm possibly AV fistula but more likely graft.  I discussed with her the risk of primary nonfunction as well as steal and need for further access procedures.  She demonstrates very good understanding we will get her set up on a nondialysis day in the near future.     Waynetta Sandy MD [facility]

## 2018-07-27 ENCOUNTER — Encounter: Payer: Medicare Other | Admitting: Vascular Surgery

## 2018-07-27 ENCOUNTER — Other Ambulatory Visit (HOSPITAL_COMMUNITY): Payer: Medicare Other

## 2018-07-27 ENCOUNTER — Encounter (HOSPITAL_COMMUNITY): Payer: Medicare Other

## 2018-08-02 ENCOUNTER — Other Ambulatory Visit (HOSPITAL_COMMUNITY)
Admission: RE | Admit: 2018-08-02 | Discharge: 2018-08-02 | Disposition: A | Discharge status: Home / Self Care | Payer: Medicare Other | Source: Ambulatory Visit | Attending: General Surgery | Admitting: General Surgery

## 2018-08-02 DIAGNOSIS — Z01812 Encounter for preprocedural laboratory examination: Secondary | ICD-10-CM | POA: Diagnosis present

## 2018-08-02 DIAGNOSIS — Z1159 Encounter for screening for other viral diseases: Secondary | ICD-10-CM | POA: Diagnosis not present

## 2018-08-03 ENCOUNTER — Encounter (HOSPITAL_COMMUNITY): Payer: Self-pay | Admitting: *Deleted

## 2018-08-03 ENCOUNTER — Other Ambulatory Visit: Payer: Self-pay

## 2018-08-03 LAB — SARS CORONAVIRUS 2 (TAT 6-24 HRS): SARS Coronavirus 2: NEGATIVE

## 2018-08-03 NOTE — Progress Notes (Signed)
Anesthesia Chart Review: SAME DAY WORKUP   Case: [id number] Date/Time: 08/04/18 0944   Procedure: ARTERIOVENOUS (AV) FISTULA CREATION VERSIS INSERTION OF ARTERIOVENOUS GRAFT LEFT ARM (Left )   Anesthesia type: Monitor Anesthesia Care   Pre-op diagnosis: END STAGE RENAL DISEASE FOR HEMODIALYSIS ACCESS   Location: [place] OR ROOM 11 / [place] [facility]   Surgeon: Waynetta Sandy, MD     DISCUSSION: 68 year old with past medical significant for migraines, CAD s/p 2 stents 2003/2004 off DAPT since 05/06/18 d/t GIB, atrial fibrillation (only on 81mg  ASA), Carotid disease s/p left CEA 2015, HTN, HFpEF, COPD, PUD, ESRD on HD TTS, panic attacks/depression, polysubstance use, recurrent SBO.  Admitted to [facility] 4/9-4/12/20 for acute BL anemia 2/2 GIB. Hgb initially 5, up to 8 after 2 units PRBC. Developed Afib with RVR, not anticoagulated due to GIB.   Admitted to [facility] 5/4-06/03/18 for SBO. Responded well to NG tube and did not require surgical intervention. Discharged on 06/03/18 after small bowel follow through confirming resolved SBO.  Followed up with Cardiologist 06/16/18. Zio patch was ordered but per subsequent notes it quickly fell off.  Followed up with PCP 06/25/18. ASA restarted. Per OV note "Complicated patient with significant cardiovascular issues now with hx of multiple MI and dual stents with PVD. Will restart ASA 81 MG DAILY due to her high cardiac risk. Will monitor for signs of GI bleed (labs in July. Watch for black stools, blood in stool) understands to stop ASA and call to report if noted signs and symptoms we discussed. Hopefully she tolerates anti-platelet therapy as we will hold off on dual therapy."  Will need DOS eval by assigned anesthesiologist.  VS: There were no vitals taken for this visit.  PROVIDERS: Nodal, [name], PA-C is PCP  Abran Richard, MD is Cardiologist  LABS: Will need DOS labs  Labs Reviewed - No data to display   EKG: 05/12/18 (care everywhere, narrative only,  tracing requested): Sinus rhythm. Rate 79. T wave abnormality, consider anterior ischemia. Prolonged QT (QTc 516) interval, consider electrolyte imbalance or drug effects. When compared with ECG of 14-Apr-2018 21:34, Sinus rhythm has replaced Atrial fibrillation. Vent. rate has decreased BY 48 BPM.. ST no longer depressed in inferior leads  CV: TTE 04/14/2018 (care everywhere): SUMMARY The left ventricular size is normal. Mild left ventricular hypertrophy  Left ventricular systolic function is normal. LV ejection fraction = 60-65%.  Left ventricular filling pattern is prolonged relaxation. The right ventricle is normal in size and function. The left atrium is mildly dilated. There is aortic valve sclerosis. There is moderate mitral regurgitation. There is moderate mitral annular calcification. There is mild tricuspid regurgitation. No significant stenosis seen Estimated right ventricular systolic pressure is 43 mmHg. Estimated right atrial pressure is 10 mmHg.. Mild pulmonary hypertension. There is no pericardial effusion. There is no comparison study available. -  Past Medical History:  Diagnosis Date  . Anemia   . Anxiety   . Arthritis   . Asthma   . Atrial fibrillation ([place])   . Chronic kidney disease    Dialysis T/Th/Sa  started in March 2020  . COPD (chronic obstructive pulmonary disease) ([place])   . Coronary artery disease    2 stents  . Depression   . GERD (gastroesophageal reflux disease)   . Gout   . Headache    migraines  . History of kidney stones   . Hyperlipidemia   . Hypertension   . Myocardial infarction ([place])    3 heart attacks  .  Pneumonia   . Small bowel obstruction [facility])     Past Surgical History:  Procedure Laterality Date  . ABDOMINAL HYSTERECTOMY    . ABDOMINAL SURGERY     for small bowel obstruction - x 2  . APPENDECTOMY    . CARDIAC CATHETERIZATION    . CORONARY ANGIOPLASTY  ?2003/2004  . FACIAL RECONSTRUCTION SURGERY     x 2  .  HERNIA REPAIR      MEDICATIONS: No current facility-administered medications for this encounter.    Marland Kitchen acetaminophen (TYLENOL) 500 MG tablet  . albuterol (PROVENTIL) (2.5 MG/3ML) 0.083% nebulizer solution  . allopurinol (ZYLOPRIM) 100 MG tablet  . amLODipine (NORVASC) 10 MG tablet  . Carboxymethylcellul-Glycerin (LUBRICATING EYE DROPS OP)  . Cholecalciferol (VITAMIN D3) 50 MCG (2000 UT) capsule  . dicyclomine (BENTYL) 10 MG capsule  . diphenhydrAMINE (BENADRYL) 25 MG tablet  . doxylamine, Sleep, (UNISOM) 25 MG tablet  . Ipratropium-Albuterol (COMBIVENT) 20-100 MCG/ACT AERS respimat  . labetalol (NORMODYNE) 200 MG tablet  . nicotine (NICODERM CQ - DOSED IN MG/24 HOURS) 21 mg/24hr patch  . nitroGLYCERIN (NITROSTAT) 0.4 MG SL tablet  . pantoprazole (PROTONIX) 40 MG tablet  . polyethylene glycol (MIRALAX / GLYCOLAX) 17 g packet  . pravastatin (PRAVACHOL) 80 MG tablet  . sennosides-docusate sodium (SENOKOT-S) 8.6-50 MG tablet  . sevelamer carbonate (RENVELA) 800 MG tablet    Wynonia Musty [facility] Short Stay Center/Anesthesiology Phone [phone] 08/03/2018 11:01 AM

## 2018-08-03 NOTE — Anesthesia Preprocedure Evaluation (Addendum)
Anesthesia Evaluation  Patient identified by MRN, date of birth, ID band Patient awake    Reviewed: Allergy & Precautions, NPO status , Patient's Chart, lab work & pertinent test results  Airway Mallampati: I  TM Distance: >3 FB Neck ROM: Full    Dental  (+) Poor Dentition, Chipped, Missing   Pulmonary asthma , COPD,  COPD inhaler, former smoker,     + decreased breath sounds      Cardiovascular hypertension, Pt. on medications and Pt. on home beta blockers + CAD, + Past MI and + Cardiac Stents  + dysrhythmias Atrial Fibrillation  Rhythm:Regular Rate:Normal     Neuro/Psych  Headaches, Anxiety Depression Bipolar Disorder CVA    GI/Hepatic Neg liver ROS, GERD  Medicated,  Endo/Other  negative endocrine ROS  Renal/GU ESRF and DialysisRenal disease     Musculoskeletal  (+) Arthritis ,   Abdominal Normal abdominal exam  (+)   Peds  Hematology   Anesthesia Other Findings   Reproductive/Obstetrics                           Anesthesia Physical Anesthesia Plan  ASA: III  Anesthesia Plan: MAC   Post-op Pain Management:    Induction: Intravenous  PONV Risk Score and Plan: 4 or greater and Ondansetron, Dexamethasone, Treatment may vary due to age or medical condition, Midazolam and Propofol infusion  Airway Management Planned: Simple Face Mask  Additional Equipment: None  Intra-op Plan:   Post-operative Plan:   Informed Consent: I have reviewed the patients History and Physical, chart, labs and discussed the procedure including the risks, benefits and alternatives for the proposed anesthesia with the patient or authorized representative who has indicated his/her understanding and acceptance.       Plan Discussed with: CRNA  Anesthesia Plan Comments: (See PAT note by Karoline Caldwell, PA-C )      Anesthesia Quick Evaluation

## 2018-08-03 NOTE — Progress Notes (Signed)
Spoke with pt for pre-op call. Pt has hx of CAD with 2 stents back in the early 2000's. States she had surgery earlier this year for a bowel obstruction and had A-fib while in the hospital. Her cardiologist is Dr. Otho Perl in [place], last office visit was in May, 2020. She states she had a monitor "study" and was told her heart rhythm was ok. Pt's PCP is Dr. Kandice Robinsons in [place]. Pt denies any recent chest pain or sob. Pt states she is not diabetic.   Pt had Covid test done yesterday and it is negative. Pt states she is in quarantine.    Coronavirus Screening  Have you experienced the following symptoms:  Cough NO Fever (>100.23F) NO Runny nose NO Sore throat NO Difficulty breathing/shortness of breath  NO  Have you or a family member traveled in the last 14 days and where? NO  Patient reminded that hospital visitation restrictions are in effect and the importance of the restrictions.

## 2018-08-04 ENCOUNTER — Encounter (HOSPITAL_COMMUNITY): Payer: Self-pay

## 2018-08-04 ENCOUNTER — Ambulatory Visit (HOSPITAL_COMMUNITY): Payer: Medicare Other | Admitting: Physician Assistant

## 2018-08-04 ENCOUNTER — Ambulatory Visit (HOSPITAL_COMMUNITY)
Admission: RE | Admit: 2018-08-04 | Discharge: 2018-08-04 | Disposition: A | Discharge status: Home / Self Care | Payer: Medicare Other | Attending: Vascular Surgery | Admitting: Vascular Surgery

## 2018-08-04 ENCOUNTER — Encounter (HOSPITAL_COMMUNITY)
Admission: RE | Disposition: A | Discharge status: Home / Self Care | Payer: Self-pay | Source: Home / Self Care | Attending: Vascular Surgery

## 2018-08-04 DIAGNOSIS — Z7951 Long term (current) use of inhaled steroids: Secondary | ICD-10-CM | POA: Insufficient documentation

## 2018-08-04 DIAGNOSIS — F419 Anxiety disorder, unspecified: Secondary | ICD-10-CM | POA: Insufficient documentation

## 2018-08-04 DIAGNOSIS — N186 End stage renal disease: Secondary | ICD-10-CM

## 2018-08-04 DIAGNOSIS — E785 Hyperlipidemia, unspecified: Secondary | ICD-10-CM | POA: Insufficient documentation

## 2018-08-04 DIAGNOSIS — Z79899 Other long term (current) drug therapy: Secondary | ICD-10-CM | POA: Diagnosis not present

## 2018-08-04 DIAGNOSIS — R51 Headache: Secondary | ICD-10-CM | POA: Insufficient documentation

## 2018-08-04 DIAGNOSIS — Z90710 Acquired absence of both cervix and uterus: Secondary | ICD-10-CM | POA: Insufficient documentation

## 2018-08-04 DIAGNOSIS — I252 Old myocardial infarction: Secondary | ICD-10-CM | POA: Insufficient documentation

## 2018-08-04 DIAGNOSIS — Z992 Dependence on renal dialysis: Secondary | ICD-10-CM | POA: Insufficient documentation

## 2018-08-04 DIAGNOSIS — Z87442 Personal history of urinary calculi: Secondary | ICD-10-CM | POA: Insufficient documentation

## 2018-08-04 DIAGNOSIS — J449 Chronic obstructive pulmonary disease, unspecified: Secondary | ICD-10-CM | POA: Insufficient documentation

## 2018-08-04 DIAGNOSIS — K219 Gastro-esophageal reflux disease without esophagitis: Secondary | ICD-10-CM | POA: Insufficient documentation

## 2018-08-04 DIAGNOSIS — I120 Hypertensive chronic kidney disease with stage 5 chronic kidney disease or end stage renal disease: Secondary | ICD-10-CM | POA: Diagnosis not present

## 2018-08-04 DIAGNOSIS — I2510 Atherosclerotic heart disease of native coronary artery without angina pectoris: Secondary | ICD-10-CM | POA: Diagnosis not present

## 2018-08-04 DIAGNOSIS — Z87891 Personal history of nicotine dependence: Secondary | ICD-10-CM | POA: Insufficient documentation

## 2018-08-04 DIAGNOSIS — M1990 Unspecified osteoarthritis, unspecified site: Secondary | ICD-10-CM | POA: Insufficient documentation

## 2018-08-04 DIAGNOSIS — Z8673 Personal history of transient ischemic attack (TIA), and cerebral infarction without residual deficits: Secondary | ICD-10-CM | POA: Diagnosis not present

## 2018-08-04 DIAGNOSIS — D631 Anemia in chronic kidney disease: Secondary | ICD-10-CM | POA: Insufficient documentation

## 2018-08-04 DIAGNOSIS — F319 Bipolar disorder, unspecified: Secondary | ICD-10-CM | POA: Insufficient documentation

## 2018-08-04 DIAGNOSIS — M109 Gout, unspecified: Secondary | ICD-10-CM | POA: Diagnosis not present

## 2018-08-04 DIAGNOSIS — I272 Pulmonary hypertension, unspecified: Secondary | ICD-10-CM | POA: Diagnosis not present

## 2018-08-04 DIAGNOSIS — I4891 Unspecified atrial fibrillation: Secondary | ICD-10-CM | POA: Insufficient documentation

## 2018-08-04 HISTORY — DX: Headache, unspecified: R51.9

## 2018-08-04 HISTORY — DX: Anxiety disorder, unspecified: F41.9

## 2018-08-04 HISTORY — DX: Pneumonia, unspecified organism: J18.9

## 2018-08-04 HISTORY — DX: Gastro-esophageal reflux disease without esophagitis: K21.9

## 2018-08-04 HISTORY — DX: Atherosclerotic heart disease of native coronary artery without angina pectoris: I25.10

## 2018-08-04 HISTORY — DX: Unspecified intestinal obstruction, unspecified as to partial versus complete obstruction: K56.609

## 2018-08-04 HISTORY — DX: Depression, unspecified: F32.A

## 2018-08-04 HISTORY — DX: Unspecified asthma, uncomplicated: J45.909

## 2018-08-04 HISTORY — PX: AV FISTULA PLACEMENT: SHX1204

## 2018-08-04 HISTORY — DX: Anemia, unspecified: D64.9

## 2018-08-04 HISTORY — DX: Unspecified atrial fibrillation: I48.91

## 2018-08-04 HISTORY — DX: Unspecified osteoarthritis, unspecified site: M19.90

## 2018-08-04 HISTORY — DX: Personal history of urinary calculi: Z87.442

## 2018-08-04 HISTORY — DX: Chronic obstructive pulmonary disease, unspecified: J44.9

## 2018-08-04 LAB — POCT I-STAT 4, (NA,K, GLUC, HGB,HCT)
Glucose, Bld: 92 mg/dL (ref 70–99)
HCT: 41 % (ref 36.0–46.0)
Hemoglobin: 13.9 g/dL (ref 12.0–15.0)
Potassium: 5 mmol/L (ref 3.5–5.1)
Sodium: 135 mmol/L (ref 135–145)

## 2018-08-04 SURGERY — ARTERIOVENOUS (AV) FISTULA CREATION
Anesthesia: Monitor Anesthesia Care | Laterality: Left

## 2018-08-04 MED ORDER — PROPOFOL 10 MG/ML IV BOLUS
INTRAVENOUS | Status: DC | PRN
  Administered 2018-08-04 (×2): 20 mg via INTRAVENOUS

## 2018-08-04 MED ORDER — CHLORHEXIDINE GLUCONATE 4 % EX LIQD: 60.0000 mL | Freq: Once | CUTANEOUS | Status: DC

## 2018-08-04 MED ORDER — SODIUM CHLORIDE 0.9 % IV SOLN
INTRAVENOUS | Status: AC
  Filled 2018-08-04: qty 1.2

## 2018-08-04 MED ORDER — ONDANSETRON HCL 4 MG/2ML IJ SOLN
INTRAMUSCULAR | Status: DC | PRN
  Administered 2018-08-04: 4 mg via INTRAVENOUS

## 2018-08-04 MED ORDER — VANCOMYCIN HCL IN DEXTROSE 1-5 GM/200ML-% IV SOLN
1000.0000 mg | INTRAVENOUS | Status: AC
  Administered 2018-08-04: 09:00:00 1000 mg via INTRAVENOUS

## 2018-08-04 MED ORDER — FENTANYL CITRATE (PF) 250 MCG/5ML IJ SOLN
INTRAMUSCULAR | Status: AC
  Filled 2018-08-04: qty 5

## 2018-08-04 MED ORDER — TRAMADOL HCL 50 MG PO TABS: 50.0000 mg | ORAL_TABLET | Freq: Four times a day (QID) | ORAL | 0 refills | Status: DC | PRN

## 2018-08-04 MED ORDER — SODIUM CHLORIDE 0.9 % IV SOLN
INTRAVENOUS | Status: DC | PRN
  Administered 2018-08-04: 500 mL

## 2018-08-04 MED ORDER — PROPOFOL 500 MG/50ML IV EMUL
INTRAVENOUS | Status: DC | PRN
  Administered 2018-08-04: 75 ug/kg/min via INTRAVENOUS

## 2018-08-04 MED ORDER — PROPOFOL 10 MG/ML IV BOLUS
INTRAVENOUS | Status: AC
  Filled 2018-08-04: qty 20

## 2018-08-04 MED ORDER — SODIUM CHLORIDE 0.9 % IV SOLN
INTRAVENOUS | Status: DC
  Administered 2018-08-04: 09:00:00 via INTRAVENOUS

## 2018-08-04 MED ORDER — DEXAMETHASONE SODIUM PHOSPHATE 10 MG/ML IJ SOLN
INTRAMUSCULAR | Status: DC | PRN
  Administered 2018-08-04: 5 mg via INTRAVENOUS

## 2018-08-04 MED ORDER — LIDOCAINE-EPINEPHRINE (PF) 1 %-1:200000 IJ SOLN
INTRAMUSCULAR | Status: DC | PRN
  Administered 2018-08-04: 10 mL

## 2018-08-04 MED ORDER — MIDAZOLAM HCL 5 MG/5ML IJ SOLN
INTRAMUSCULAR | Status: DC | PRN
  Administered 2018-08-04: 2 mg via INTRAVENOUS

## 2018-08-04 MED ORDER — LABETALOL HCL 5 MG/ML IV SOLN
5.0000 mg | Freq: Once | INTRAVENOUS | Status: AC
  Administered 2018-08-04: 13:00:00 5 mg via INTRAVENOUS

## 2018-08-04 MED ORDER — FENTANYL CITRATE (PF) 100 MCG/2ML IJ SOLN
INTRAMUSCULAR | Status: DC | PRN
  Administered 2018-08-04 (×2): 25 ug via INTRAVENOUS

## 2018-08-04 MED ORDER — MIDAZOLAM HCL 2 MG/2ML IJ SOLN
INTRAMUSCULAR | Status: AC
  Filled 2018-08-04: qty 2

## 2018-08-04 MED ORDER — LABETALOL HCL 5 MG/ML IV SOLN
INTRAVENOUS | Status: AC
  Filled 2018-08-04: qty 4

## 2018-08-04 MED ORDER — LIDOCAINE 2% (20 MG/ML) 5 ML SYRINGE
INTRAMUSCULAR | Status: DC | PRN
  Administered 2018-08-04: 60 mg via INTRAVENOUS

## 2018-08-04 MED ORDER — VANCOMYCIN HCL IN DEXTROSE 1-5 GM/200ML-% IV SOLN
INTRAVENOUS | Status: AC
  Administered 2018-08-04: 09:00:00 1000 mg via INTRAVENOUS
  Filled 2018-08-04: qty 200

## 2018-08-04 SURGICAL SUPPLY — 29 items
ADH SKN CLS APL DERMABOND .7 (GAUZE/BANDAGES/DRESSINGS) ×1
ARMBAND PINK RESTRICT EXTREMIT (MISCELLANEOUS) ×3 IMPLANT
CANISTER SUCT 3000ML PPV (MISCELLANEOUS) ×3 IMPLANT
CLIP VESOCCLUDE MED 6/CT (CLIP) ×3 IMPLANT
CLIP VESOCCLUDE SM WIDE 6/CT (CLIP) ×3 IMPLANT
COVER PROBE W GEL 5X96 (DRAPES) IMPLANT
COVER WAND RF STERILE (DRAPES) ×3 IMPLANT
DERMABOND ADVANCED (GAUZE/BANDAGES/DRESSINGS) ×2
DERMABOND ADVANCED .7 DNX12 (GAUZE/BANDAGES/DRESSINGS) ×1 IMPLANT
ELECT REM PT RETURN 9FT ADLT (ELECTROSURGICAL) ×3
ELECTRODE REM PT RTRN 9FT ADLT (ELECTROSURGICAL) ×1 IMPLANT
GLOVE BIO SURGEON STRL SZ7.5 (GLOVE) ×3 IMPLANT
GOWN STRL REUS W/ TWL LRG LVL3 (GOWN DISPOSABLE) ×2 IMPLANT
GOWN STRL REUS W/ TWL XL LVL3 (GOWN DISPOSABLE) ×1 IMPLANT
GOWN STRL REUS W/TWL LRG LVL3 (GOWN DISPOSABLE) ×6
GOWN STRL REUS W/TWL XL LVL3 (GOWN DISPOSABLE) ×3
INSERT FOGARTY SM (MISCELLANEOUS) IMPLANT
KIT BASIN OR (CUSTOM PROCEDURE TRAY) ×3 IMPLANT
KIT TURNOVER KIT B (KITS) ×3 IMPLANT
NS IRRIG 1000ML POUR BTL (IV SOLUTION) ×3 IMPLANT
PACK CV ACCESS (CUSTOM PROCEDURE TRAY) ×3 IMPLANT
PAD ARMBOARD 7.5X6 YLW CONV (MISCELLANEOUS) ×6 IMPLANT
SUT MNCRL AB 4-0 PS2 18 (SUTURE) ×3 IMPLANT
SUT PROLENE 6 0 BV (SUTURE) ×7 IMPLANT
SUT VIC AB 3-0 SH 27 (SUTURE) ×3
SUT VIC AB 3-0 SH 27X BRD (SUTURE) ×1 IMPLANT
TOWEL GREEN STERILE (TOWEL DISPOSABLE) ×3 IMPLANT
UNDERPAD 30X30 (UNDERPADS AND DIAPERS) ×3 IMPLANT
WATER STERILE IRR 1000ML POUR (IV SOLUTION) ×3 IMPLANT

## 2018-08-04 NOTE — Anesthesia Procedure Notes (Signed)
Date/Time: 08/04/2018 10:38 AM Performed by: Trinna Post., CRNA Pre-anesthesia Checklist: Patient identified, Emergency Drugs available, Suction available, Patient being monitored and Timeout performed Patient Re-evaluated:Patient Re-evaluated prior to induction Oxygen Delivery Method: Simple face mask Preoxygenation: Pre-oxygenation with 100% oxygen Induction Type: IV induction Placement Confirmation: positive ETCO2

## 2018-08-04 NOTE — Op Note (Signed)
    Patient name: Christina [name] MRN: [id number] DOB: [birth year] Sex: female  08/04/2018 Pre-operative Diagnosis: esrd Post-operative diagnosis:  Same Surgeon:  Erlene Quan C. Donzetta Matters, MD Assistant: Arlee Muslim, PA Procedure Performed:  Left arm for stage basilic vein AV fistula creation  Indications: 68 year old female with end-stage renal disease on dialysis via catheter.  She is indicated for permanent access.  She has marginal vein for access creation by preoperative duplex.  Findings: By ultrasound on table.  To have at least 3 mm vein basilic which was large throughout the upper arm.  This was easily dilated to 4 mm.  At completion there was a strong thrill and there is palpable radial pulse at the wrist   Procedure:  The patient was identified in the holding area and taken to the operating room where she has a biopsy table and MAC anesthesia induced.  She was to the prepped draped in the left upper extremity usual fashion antibiotics were administered timeout called.  Ultrasound was used to identify the basilic vein which appeared to be suitable for access creation.  The area was anesthetized 1% lidocaine.  Transverse incision was made above the antecubitum between the vein and the palpable brachial pulse.  First dissected out the vein more for orientation.  Divided through the deep fascia placed a vessel loop around the brachial artery.  Vein was then transected distally and tied off with 3-0 silk suture.  I dilated this from 2 mm up to 4 mm flushed with heparinized saline and clamped.  The artery was clamped distally proximally opened longitudinally flushed heparinized in both directions.  The vein was then sewn inside with 6-0 Prolene suture.  Prior to completion anastomosis without flushing all directions.  Completion there was a good thrill in the vein palpable radial pulse the wrist.  Both these were confirmed with Doppler.  Signal at the wrist did augment with compression of the vein.  Satisfied  with this we obtain hemostasis irrigated the wound closed in layers Vicryl Monocryl.  She was awakened anesthesia and tolerated procedure well that immediate complication.  Counts were correct at completion.  EBL: 20 cc   Claribel Sachs C. Donzetta Matters, MD [facility] [place] Office: [phone] Pager: [phone]

## 2018-08-04 NOTE — Discharge Instructions (Signed)
° °  [facility] [place] ° °Discharge Instructions ° °AV Fistula or Graft Surgery for Dialysis Access ° °Please refer to the following instructions for your post-procedure care. Your surgeon or physician assistant will discuss any changes with you. ° °Activity ° °You may drive the day following your surgery, if you are comfortable and no longer taking prescription pain medication. Resume full activity as the soreness in your incision resolves. ° °Bathing/Showering ° °You may shower after you go home. Keep your incision dry for 48 hours. Do not soak in a bathtub, hot tub, or swim until the incision heals completely. You may not shower if you have a hemodialysis catheter. ° °Incision Care ° °Clean your incision with mild soap and water after 48 hours. Pat the area dry with a clean towel. You do not need a bandage unless otherwise instructed. Do not apply any ointments or creams to your incision. You may have skin glue on your incision. Do not peel it off. It will come off on its own in about one week. Your arm may swell a bit after surgery. To reduce swelling use pillows to elevate your arm so it is above your heart. Your doctor will tell you if you need to lightly wrap your arm with an ACE bandage. ° °Diet ° °Resume your normal diet. There are not special food restrictions following this procedure. In order to heal from your surgery, it is CRITICAL to get adequate nutrition. Your body requires vitamins, minerals, and protein. Vegetables are the best source of vitamins and minerals. Vegetables also provide the perfect balance of protein. Processed food has little nutritional value, so try to avoid this. ° °Medications ° °Resume taking all of your medications. If your incision is causing pain, you may take over-the counter pain relievers such as acetaminophen (Tylenol). If you were prescribed a stronger pain medication, please be aware these medications can cause nausea and constipation. Prevent  nausea by taking the medication with a snack or meal. Avoid constipation by drinking plenty of fluids and eating foods with high amount of fiber, such as fruits, vegetables, and grains. Do not take Tylenol if you are taking prescription pain medications. ° ° ° ° °Follow up °Your surgeon may want to see you in the office following your access surgery. If so, this will be arranged at the time of your surgery. ° °Please call us immediately for any of the following conditions: ° °Increased pain, redness, drainage (pus) from your incision site °Fever of 101 degrees or higher °Severe or worsening pain at your incision site °Hand pain or numbness. ° °Reduce your risk of vascular disease: ° °Stop smoking. If you would like help, call [facility] at 1-800-QUIT-NOW (1-800-784-8669) or [place] at 336-586-4000 ° °Manage your cholesterol °Maintain a desired weight °Control your diabetes °Keep your blood pressure down ° °Dialysis ° °It will take several weeks to several months for your new dialysis access to be ready for use. Your surgeon will determine when it is OK to use it. Your nephrologist will continue to direct your dialysis. You can continue to use your Permcath until your new access is ready for use. ° °If you have any questions, please call the office at [phone]. ° °

## 2018-08-04 NOTE — H&P (Signed)
   History and Physical Update  The patient was interviewed and re-examined.  The patient's previous History and Physical has been reviewed and is unchanged from recent office visit. Plan for left arm avf vs avg today in OR.   Dawnmarie Breon C. Donzetta Matters, MD [facility] [place] Office: [phone] Pager: [phone]   08/04/2018, 7:59 AM

## 2018-08-04 NOTE — Transfer of Care (Signed)
Immediate Anesthesia Transfer of Care Note  Patient: Christina [name]  Procedure(s) Performed: ARTERIOVENOUS (AV) FISTULA CREATION LEFT ARM (Left )  Patient Location: PACU  Anesthesia Type:MAC  Level of Consciousness: awake, alert  and oriented  Airway & Oxygen Therapy: Patient Spontanous Breathing and Patient connected to face mask oxygen  Post-op Assessment: Report given to RN and Post -op Vital signs reviewed and stable  Post vital signs: Reviewed and stable  Last Vitals:  Vitals Value Taken Time  BP 165/66 08/04/18 1145  Temp    Pulse 72 08/04/18 1147  Resp    SpO2 100 % 08/04/18 1147  Vitals shown include unvalidated device data.  Last Pain:  Vitals:   08/04/18 0824  TempSrc:   PainSc: 0-No pain         Complications: No apparent anesthesia complications

## 2018-08-04 NOTE — Anesthesia Postprocedure Evaluation (Signed)
Anesthesia Post Note  Patient: Christina [name]  Procedure(s) Performed: ARTERIOVENOUS (AV) FISTULA CREATION LEFT ARM (Left )     Patient location during evaluation: PACU Anesthesia Type: MAC Level of consciousness: awake and alert Pain management: pain level controlled Vital Signs Assessment: post-procedure vital signs reviewed and stable Respiratory status: spontaneous breathing, nonlabored ventilation, respiratory function stable and patient connected to nasal cannula oxygen Cardiovascular status: stable and blood pressure returned to baseline Postop Assessment: no apparent nausea or vomiting Anesthetic complications: no    Last Vitals:  Vitals:   08/04/18 1316 08/04/18 1329  BP: (!) 161/71 (!) 165/58  Pulse: 71 72  Resp: (!) 24 18  Temp:  (!) 36.4 C  SpO2: 96% 97%    Last Pain:  Vitals:   08/04/18 0824  TempSrc:   PainSc: 0-No pain                 Effie Berkshire

## 2018-08-05 ENCOUNTER — Encounter (HOSPITAL_COMMUNITY): Payer: Self-pay | Admitting: Vascular Surgery

## 2018-08-10 ENCOUNTER — Inpatient Hospital Stay (HOSPITAL_COMMUNITY)
Admission: EM | Admit: 2018-08-10 | Discharge: 2018-08-11 | DRG: 388 | Disposition: A | Discharge status: Home / Self Care | Payer: Medicare Other | Source: Other Acute Inpatient Hospital | Attending: Internal Medicine | Admitting: Internal Medicine

## 2018-08-10 ENCOUNTER — Encounter (HOSPITAL_COMMUNITY): Payer: Self-pay | Admitting: Internal Medicine

## 2018-08-10 ENCOUNTER — Inpatient Hospital Stay (HOSPITAL_COMMUNITY): Payer: Medicare Other

## 2018-08-10 DIAGNOSIS — K5909 Other constipation: Secondary | ICD-10-CM | POA: Diagnosis present

## 2018-08-10 DIAGNOSIS — K56609 Unspecified intestinal obstruction, unspecified as to partial versus complete obstruction: Principal | ICD-10-CM | POA: Diagnosis present

## 2018-08-10 DIAGNOSIS — I4891 Unspecified atrial fibrillation: Secondary | ICD-10-CM | POA: Diagnosis present

## 2018-08-10 DIAGNOSIS — F419 Anxiety disorder, unspecified: Secondary | ICD-10-CM | POA: Diagnosis present

## 2018-08-10 DIAGNOSIS — F319 Bipolar disorder, unspecified: Secondary | ICD-10-CM | POA: Diagnosis present

## 2018-08-10 DIAGNOSIS — E785 Hyperlipidemia, unspecified: Secondary | ICD-10-CM | POA: Diagnosis present

## 2018-08-10 DIAGNOSIS — I252 Old myocardial infarction: Secondary | ICD-10-CM | POA: Diagnosis not present

## 2018-08-10 DIAGNOSIS — Z888 Allergy status to other drugs, medicaments and biological substances status: Secondary | ICD-10-CM | POA: Diagnosis not present

## 2018-08-10 DIAGNOSIS — I120 Hypertensive chronic kidney disease with stage 5 chronic kidney disease or end stage renal disease: Secondary | ICD-10-CM | POA: Diagnosis present

## 2018-08-10 DIAGNOSIS — Z885 Allergy status to narcotic agent status: Secondary | ICD-10-CM | POA: Diagnosis not present

## 2018-08-10 DIAGNOSIS — E875 Hyperkalemia: Secondary | ICD-10-CM | POA: Diagnosis present

## 2018-08-10 DIAGNOSIS — Z66 Do not resuscitate: Secondary | ICD-10-CM | POA: Diagnosis present

## 2018-08-10 DIAGNOSIS — Z87891 Personal history of nicotine dependence: Secondary | ICD-10-CM

## 2018-08-10 DIAGNOSIS — Z1159 Encounter for screening for other viral diseases: Secondary | ICD-10-CM

## 2018-08-10 DIAGNOSIS — Z90710 Acquired absence of both cervix and uterus: Secondary | ICD-10-CM

## 2018-08-10 DIAGNOSIS — Z955 Presence of coronary angioplasty implant and graft: Secondary | ICD-10-CM | POA: Diagnosis not present

## 2018-08-10 DIAGNOSIS — I10 Essential (primary) hypertension: Secondary | ICD-10-CM | POA: Diagnosis not present

## 2018-08-10 DIAGNOSIS — N2581 Secondary hyperparathyroidism of renal origin: Secondary | ICD-10-CM | POA: Diagnosis present

## 2018-08-10 DIAGNOSIS — D631 Anemia in chronic kidney disease: Secondary | ICD-10-CM | POA: Diagnosis present

## 2018-08-10 DIAGNOSIS — J439 Emphysema, unspecified: Secondary | ICD-10-CM | POA: Diagnosis present

## 2018-08-10 DIAGNOSIS — Z880 Allergy status to penicillin: Secondary | ICD-10-CM

## 2018-08-10 DIAGNOSIS — K219 Gastro-esophageal reflux disease without esophagitis: Secondary | ICD-10-CM | POA: Diagnosis present

## 2018-08-10 DIAGNOSIS — Z87442 Personal history of urinary calculi: Secondary | ICD-10-CM

## 2018-08-10 DIAGNOSIS — Z79891 Long term (current) use of opiate analgesic: Secondary | ICD-10-CM

## 2018-08-10 DIAGNOSIS — Z992 Dependence on renal dialysis: Secondary | ICD-10-CM

## 2018-08-10 DIAGNOSIS — Z0189 Encounter for other specified special examinations: Secondary | ICD-10-CM

## 2018-08-10 DIAGNOSIS — N186 End stage renal disease: Secondary | ICD-10-CM | POA: Diagnosis present

## 2018-08-10 DIAGNOSIS — Z9861 Coronary angioplasty status: Secondary | ICD-10-CM | POA: Diagnosis not present

## 2018-08-10 DIAGNOSIS — I2510 Atherosclerotic heart disease of native coronary artery without angina pectoris: Secondary | ICD-10-CM | POA: Diagnosis present

## 2018-08-10 DIAGNOSIS — Z79899 Other long term (current) drug therapy: Secondary | ICD-10-CM

## 2018-08-10 LAB — CBC WITH DIFFERENTIAL/PLATELET
Abs Immature Granulocytes: 0.03 10*3/uL (ref 0.00–0.07)
Basophils Absolute: 0 10*3/uL (ref 0.0–0.1)
Basophils Relative: 1 %
Eosinophils Absolute: 0.2 10*3/uL (ref 0.0–0.5)
Eosinophils Relative: 2 %
HCT: 39.7 % (ref 36.0–46.0)
Hemoglobin: 12.6 g/dL (ref 12.0–15.0)
Immature Granulocytes: 0 %
Lymphocytes Relative: 14 %
Lymphs Abs: 1.3 10*3/uL (ref 0.7–4.0)
MCH: 29.4 pg (ref 26.0–34.0)
MCHC: 31.7 g/dL (ref 30.0–36.0)
MCV: 92.8 fL (ref 80.0–100.0)
Monocytes Absolute: 0.9 10*3/uL (ref 0.1–1.0)
Monocytes Relative: 10 %
Neutro Abs: 6.4 10*3/uL (ref 1.7–7.7)
Neutrophils Relative %: 73 %
Platelets: 205 10*3/uL (ref 150–400)
RBC: 4.28 MIL/uL (ref 3.87–5.11)
RDW: 16.4 % — ABNORMAL HIGH (ref 11.5–15.5)
WBC: 8.8 10*3/uL (ref 4.0–10.5)
nRBC: 0 % (ref 0.0–0.2)

## 2018-08-10 LAB — MRSA PCR SCREENING: MRSA by PCR: NEGATIVE

## 2018-08-10 LAB — COMPREHENSIVE METABOLIC PANEL
ALT: 13 U/L (ref 0–44)
AST: 17 U/L (ref 15–41)
Albumin: 3.2 g/dL — ABNORMAL LOW (ref 3.5–5.0)
Alkaline Phosphatase: 86 U/L (ref 38–126)
Anion gap: 17 — ABNORMAL HIGH (ref 5–15)
BUN: 70 mg/dL — ABNORMAL HIGH (ref 8–23)
CO2: 21 mmol/L — ABNORMAL LOW (ref 22–32)
Calcium: 9 mg/dL (ref 8.9–10.3)
Chloride: 100 mmol/L (ref 98–111)
Creatinine, Ser: 6.2 mg/dL — ABNORMAL HIGH (ref 0.44–1.00)
GFR calc Af Amer: 7 mL/min — ABNORMAL LOW (ref >60)
GFR calc non Af Amer: 6 mL/min — ABNORMAL LOW (ref >60)
Glucose, Bld: 83 mg/dL (ref 70–99)
Potassium: 5.3 mmol/L — ABNORMAL HIGH (ref 3.5–5.1)
Sodium: 138 mmol/L (ref 135–145)
Total Bilirubin: 0.5 mg/dL (ref 0.3–1.2)
Total Protein: 6.2 g/dL — ABNORMAL LOW (ref 6.5–8.1)

## 2018-08-10 LAB — CBC
HCT: 47.2 % — ABNORMAL HIGH (ref 36.0–46.0)
Hemoglobin: 15.3 g/dL — ABNORMAL HIGH (ref 12.0–15.0)
MCH: 30 pg (ref 26.0–34.0)
MCHC: 32.4 g/dL (ref 30.0–36.0)
MCV: 92.5 fL (ref 80.0–100.0)
Platelets: 212 10*3/uL (ref 150–400)
RBC: 5.1 MIL/uL (ref 3.87–5.11)
RDW: 16.4 % — ABNORMAL HIGH (ref 11.5–15.5)
WBC: 9.7 10*3/uL (ref 4.0–10.5)
nRBC: 0 % (ref 0.0–0.2)

## 2018-08-10 MED ORDER — ALBUTEROL SULFATE (2.5 MG/3ML) 0.083% IN NEBU: 2.5000 mg | INHALATION_SOLUTION | Freq: Four times a day (QID) | RESPIRATORY_TRACT | Status: DC | PRN

## 2018-08-10 MED ORDER — ACETAMINOPHEN 325 MG PO TABS: 650.0000 mg | ORAL_TABLET | Freq: Four times a day (QID) | ORAL | Status: DC | PRN

## 2018-08-10 MED ORDER — FENTANYL CITRATE (PF) 100 MCG/2ML IJ SOLN
25.0000 ug | INTRAMUSCULAR | Status: DC | PRN
  Administered 2018-08-10: 25 ug via INTRAVENOUS
  Filled 2018-08-10: qty 2

## 2018-08-10 MED ORDER — SORBITOL 70 % SOLN
960.0000 mL | TOPICAL_OIL | Freq: Once | ORAL | Status: AC
  Administered 2018-08-10: 960 mL via RECTAL
  Filled 2018-08-10: qty 473

## 2018-08-10 MED ORDER — PENTAFLUOROPROP-TETRAFLUOROETH EX AERO: 1.0000 "application " | INHALATION_SPRAY | CUTANEOUS | Status: DC | PRN

## 2018-08-10 MED ORDER — ONDANSETRON HCL 4 MG/2ML IJ SOLN: 4.0000 mg | Freq: Four times a day (QID) | INTRAMUSCULAR | Status: DC | PRN

## 2018-08-10 MED ORDER — CALCIUM CARBONATE ANTACID 1250 MG/5ML PO SUSP
500.0000 mg | Freq: Four times a day (QID) | ORAL | Status: DC | PRN
  Filled 2018-08-10: qty 5

## 2018-08-10 MED ORDER — CAMPHOR-MENTHOL 0.5-0.5 % EX LOTN
1.0000 "application " | TOPICAL_LOTION | Freq: Three times a day (TID) | CUTANEOUS | Status: DC | PRN
  Filled 2018-08-10: qty 222

## 2018-08-10 MED ORDER — HEPARIN SODIUM (PORCINE) 1000 UNIT/ML DIALYSIS: 1000.0000 [IU] | INTRAMUSCULAR | Status: DC | PRN

## 2018-08-10 MED ORDER — ZOLPIDEM TARTRATE 5 MG PO TABS: 5.0000 mg | ORAL_TABLET | Freq: Every evening | ORAL | Status: DC | PRN

## 2018-08-10 MED ORDER — DOCUSATE SODIUM 283 MG RE ENEM
1.0000 | ENEMA | RECTAL | Status: DC | PRN
  Filled 2018-08-10: qty 1

## 2018-08-10 MED ORDER — SODIUM CHLORIDE 0.9 % IV SOLN: 100.0000 mL | INTRAVENOUS | Status: DC | PRN

## 2018-08-10 MED ORDER — IPRATROPIUM-ALBUTEROL 20-100 MCG/ACT IN AERS: 2.0000 | INHALATION_SPRAY | Freq: Four times a day (QID) | RESPIRATORY_TRACT | Status: DC | PRN

## 2018-08-10 MED ORDER — ACETAMINOPHEN 650 MG RE SUPP: 650.0000 mg | Freq: Four times a day (QID) | RECTAL | Status: DC | PRN

## 2018-08-10 MED ORDER — LIDOCAINE HCL (PF) 1 % IJ SOLN: 5.0000 mL | INTRAMUSCULAR | Status: DC | PRN

## 2018-08-10 MED ORDER — LABETALOL HCL 5 MG/ML IV SOLN
5.0000 mg | Freq: Four times a day (QID) | INTRAVENOUS | Status: DC
  Administered 2018-08-10 – 2018-08-11 (×3): 5 mg via INTRAVENOUS
  Filled 2018-08-10 (×3): qty 4

## 2018-08-10 MED ORDER — HYDROXYZINE HCL 25 MG PO TABS: 25.0000 mg | ORAL_TABLET | Freq: Three times a day (TID) | ORAL | Status: DC | PRN

## 2018-08-10 MED ORDER — SORBITOL 70 % SOLN: 30.0000 mL | Status: DC | PRN

## 2018-08-10 MED ORDER — HYDROCORTISONE ACETATE 25 MG RE SUPP
25.0000 mg | Freq: Two times a day (BID) | RECTAL | Status: DC
  Administered 2018-08-10 – 2018-08-11 (×2): 25 mg via RECTAL
  Filled 2018-08-10 (×2): qty 1

## 2018-08-10 MED ORDER — NICOTINE 21 MG/24HR TD PT24: 21.0000 mg | MEDICATED_PATCH | Freq: Every day | TRANSDERMAL | Status: DC | PRN

## 2018-08-10 MED ORDER — ALTEPLASE 2 MG IJ SOLR: 2.0000 mg | Freq: Once | INTRAMUSCULAR | Status: DC | PRN

## 2018-08-10 MED ORDER — ONDANSETRON HCL 4 MG PO TABS: 4.0000 mg | ORAL_TABLET | Freq: Four times a day (QID) | ORAL | Status: DC | PRN

## 2018-08-10 MED ORDER — LIDOCAINE-PRILOCAINE 2.5-2.5 % EX CREA: 1.0000 "application " | TOPICAL_CREAM | CUTANEOUS | Status: DC | PRN

## 2018-08-10 MED ORDER — IPRATROPIUM-ALBUTEROL 0.5-2.5 (3) MG/3ML IN SOLN: 3.0000 mL | Freq: Four times a day (QID) | RESPIRATORY_TRACT | Status: DC | PRN

## 2018-08-10 MED ORDER — CHLORHEXIDINE GLUCONATE CLOTH 2 % EX PADS
6.0000 | MEDICATED_PAD | Freq: Every day | CUTANEOUS | Status: DC
  Administered 2018-08-10: 6 via TOPICAL

## 2018-08-10 MED ORDER — HYDRALAZINE HCL 20 MG/ML IJ SOLN: 5.0000 mg | INTRAMUSCULAR | Status: DC | PRN

## 2018-08-10 MED ORDER — NEPRO/CARBSTEADY PO LIQD
237.0000 mL | Freq: Three times a day (TID) | ORAL | Status: DC | PRN
  Filled 2018-08-10: qty 237

## 2018-08-10 MED ORDER — DIATRIZOATE MEGLUMINE & SODIUM 66-10 % PO SOLN
90.0000 mL | Freq: Once | ORAL | Status: AC
  Administered 2018-08-10: 90 mL via NASOGASTRIC
  Filled 2018-08-10: qty 90

## 2018-08-10 NOTE — Progress Notes (Signed)
[facility] flow manager made aware that patient arrived from [facility] and needing admission orders. Delois Tolbert, Wonda Cheng, Therapist, sports

## 2018-08-10 NOTE — Consult Note (Addendum)
[facility] Renal Consultation Note    Indication for Consultation:  Management of ESRD/hemodialysis; anemia, hypertension/volume and secondary hyperparathyroidism  YNW:GNFAO, [name], PA-C  HPI: Christina [name] is a 68 y.o. female. ESRD on HD TTS at [facility], first starting in March 2020.  Past medical history significant for HTN, CAD s/p 2 stents, hx MI, A fib, b/l carotid artery disease, bipolar disorder, COPD, substance abuse and Hx SBO x3.    Seen and examined at bedside.  Patient presented to the [facility] yesterday due to abdominal pain and was transferred to [facility] today for further evaluation of possible recurrent SBO identified on CT.  [name] states her pain woke her up yesterday at 3am and continued throughout the day even after a normal BM.  Denies hematochezia, melana, n/v, fever, and chills.  Took tylenol with no relief.  Continues to have centralized abdominal pain with no improvement.   Of note, patient is mostly compliant with prescribed dialysis regimen.  She occasionally misses and shortens her treatments. Last HD on 7/11 was shortened and she left 1kg over her estimated dry.  Patient had a LU basilic vein fistula created on 08/04/2018 by Dr. Donzetta Matters.    Pertinent findings include K 5.3, BUN 70 and CT showing significant fecalization of the small bowel with concerns for SBO near previous surgical sites.  Patient is very uncomfortable when I see her- abdomen distended and in pain   Past Medical History:  Diagnosis Date  . Anemia   . Anxiety   . Arthritis   . Asthma   . Atrial fibrillation ([place])   . Chronic kidney disease    Dialysis T/Th/Sa  started in March 2020  . COPD (chronic obstructive pulmonary disease) ([place])   . Coronary artery disease    2 stents  . Depression   . GERD (gastroesophageal reflux disease)   . Gout   . Headache    migraines  . History of kidney stones   . Hyperlipidemia   . Hypertension   . Pneumonia   . Small  bowel obstruction [facility])    Past Surgical History:  Procedure Laterality Date  . ABDOMINAL HYSTERECTOMY    . ABDOMINAL SURGERY     for small bowel obstruction - x 2  . APPENDECTOMY    . AV FISTULA PLACEMENT Left 08/04/2018   Procedure: ARTERIOVENOUS (AV) FISTULA CREATION LEFT ARM;  Surgeon: Waynetta Sandy, MD;  Location: [place];  Service: Vascular;  Laterality: Left;  . CARDIAC CATHETERIZATION    . CORONARY ANGIOPLASTY  ?2003/2004  . FACIAL RECONSTRUCTION SURGERY     x 2  . HERNIA REPAIR     History reviewed. No pertinent family history. Social History:  reports that she has quit smoking. She has never used smokeless tobacco. She reports previous alcohol use. She reports current drug use. Drug: Marijuana. Allergies  Allergen Reactions  . Cyclobenzaprine Anaphylaxis and Other (See Comments)    "stopped heart"   . Morphine Anaphylaxis    "stopped heart"  . Penicillins Shortness Of Breath, Swelling and Palpitations    Did it involve swelling of the face/tongue/throat, SOB, or low BP? Yes Did it involve sudden or severe rash/hives, skin peeling, or any reaction on the inside of your mouth or nose? No Did you need to seek medical attention at a hospital or doctor's office? No When did it last happen?years  If all above answers are "NO", may proceed with cephalosporin use.   Marland Kitchen Hydromorphone Other (See Comments)  If administered quickly, felt like hand was "exploding"   . Codeine Itching and Rash   Prior to Admission medications   Medication Sig Start Date End Date Taking? Authorizing Provider  acetaminophen (TYLENOL) 500 MG tablet Take 1,000 mg by mouth every 6 (six) hours as needed for moderate pain or headache.   Yes [provider]  albuterol (PROVENTIL) (2.5 MG/3ML) 0.083% nebulizer solution Take 2.5 mg by nebulization every 6 (six) hours as needed for wheezing or shortness of breath.   Yes [provider]  allopurinol (ZYLOPRIM) 100 MG tablet  Take 100 mg by mouth daily. 02/18/18  Yes [provider]  amLODipine (NORVASC) 10 MG tablet Take 10 mg by mouth at bedtime. 04/21/18  Yes [provider]  Carboxymethylcellul-Glycerin (LUBRICATING EYE DROPS OP) Place 1 drop into both eyes daily as needed (dry eyes).   Yes [provider]  Cholecalciferol (VITAMIN D3) 50 MCG (2000 UT) capsule Take 2,000 Units by mouth every morning.   Yes [provider]  dicyclomine (BENTYL) 10 MG capsule Take 20 mg by mouth 2 (two) times a day.  08/27/17  Yes [provider]  diphenhydrAMINE (BENADRYL) 25 MG tablet Take 25 mg by mouth daily as needed for allergies.   Yes [provider]  doxylamine, Sleep, (UNISOM) 25 MG tablet Take 25 mg by mouth at bedtime as needed for sleep.   Yes [provider]  Ipratropium-Albuterol (COMBIVENT) 20-100 MCG/ACT AERS respimat Inhale 2 puffs into the lungs 4 (four) times daily as needed for wheezing. 09/11/17  Yes [provider]  labetalol (NORMODYNE) 200 MG tablet Take 200 mg by mouth 2 (two) times daily. 03/03/18  Yes [provider]  nicotine (NICODERM CQ - DOSED IN MG/24 HOURS) 21 mg/24hr patch Place 21 mg onto the skin daily as needed (smoking cessation).   Yes [provider]  nitroGLYCERIN (NITROSTAT) 0.4 MG SL tablet Place 0.4 mg under the tongue every 5 (five) minutes as needed for chest pain.  09/18/16  Yes [provider]  pantoprazole (PROTONIX) 40 MG tablet Take 1 tablet (40 mg total) by mouth 2 (two) times daily. Patient taking differently: Take 40 mg by mouth daily.  05/09/18  Yes Regalado, Belkys A, MD  polyethylene glycol (MIRALAX / GLYCOLAX) 17 g packet Take 17 g by mouth daily. Patient taking differently: Take 17 g by mouth 2 (two) times daily.  05/09/18  Yes Regalado, Belkys A, MD  pravastatin (PRAVACHOL) 80 MG tablet Take 80 mg by mouth daily.  06/30/16  Yes [provider]  sennosides-docusate sodium (SENOKOT-S)  8.6-50 MG tablet Take 1 tablet by mouth daily.   Yes [provider]  sevelamer carbonate (RENVELA) 800 MG tablet Take 1,600-2,400 mg by mouth See admin instructions. Take 2400 mg with each meal and 1600 mg with each snack 10/15/17  Yes [provider]  traMADol (ULTRAM) 50 MG tablet Take 1 tablet (50 mg total) by mouth every 6 (six) hours as needed. Patient taking differently: Take 50 mg by mouth every 6 (six) hours as needed for moderate pain.  08/04/18 08/04/19 Yes Dagoberto Ligas, PA-C   Current Facility-Administered Medications  Medication Dose Route Frequency Provider Last Rate Last Dose  . acetaminophen (TYLENOL) tablet 650 mg  650 mg Oral Q6H PRN Karmen Bongo, MD       Or  . acetaminophen (TYLENOL) suppository 650 mg  650 mg Rectal Q6H PRN Karmen Bongo, MD      . albuterol (PROVENTIL) (2.5 MG/3ML) 0.083%  nebulizer solution 2.5 mg  2.5 mg Nebulization Q6H PRN Karmen Bongo, MD      . calcium carbonate (dosed in mg elemental calcium) suspension 500 mg of elemental calcium  500 mg of elemental calcium Oral Q6H PRN Karmen Bongo, MD      . camphor-menthol Timoteo Ace) lotion 1 application  1 application Topical C1K PRN Karmen Bongo, MD       And  . hydrOXYzine (ATARAX/VISTARIL) tablet 25 mg  25 mg Oral Q8H PRN Karmen Bongo, MD      . Chlorhexidine Gluconate Cloth 2 % PADS 6 each  6 each Topical Q0600 Penninger, Ria Comment, [place]      . docusate sodium (ENEMEEZ) enema 283 mg  1 enema Rectal PRN Karmen Bongo, MD      . fentaNYL (SUBLIMAZE) injection 25 mcg  25 mcg Intravenous Q2H PRN Karmen Bongo, MD   25 mcg at 08/10/18 1125  . hydrALAZINE (APRESOLINE) injection 5 mg  5 mg Intravenous Q4H PRN Karmen Bongo, MD      . ipratropium-albuterol (DUONEB) 0.5-2.5 (3) MG/3ML nebulizer solution 3 mL  3 mL Nebulization Q6H PRN Karmen Bongo, MD      . labetalol (NORMODYNE) injection 5 mg  5 mg Intravenous Q6H Karmen Bongo, MD      . nicotine (NICODERM CQ - dosed in mg/24  hours) patch 21 mg  21 mg Transdermal Daily PRN Karmen Bongo, MD      . ondansetron [facility]) tablet 4 mg  4 mg Oral Q6H PRN Karmen Bongo, MD       Or  . ondansetron [facility]) injection 4 mg  4 mg Intravenous Q6H PRN Karmen Bongo, MD      . sorbitol 70 % solution 30 mL  30 mL Oral PRN Karmen Bongo, MD      . zolpidem (AMBIEN) tablet 5 mg  5 mg Oral QHS PRN Karmen Bongo, MD       Labs: Basic Metabolic Panel: Recent Labs  Lab 08/04/18 0756 08/10/18 0908  NA 135 138  K 5.0 5.3*  CL  --  100  CO2  --  21*  GLUCOSE 92 83  BUN  --  70*  CREATININE  --  6.20*  CALCIUM  --  9.0   Liver Function Tests: Recent Labs  Lab 08/10/18 0908  AST 17  ALT 13  ALKPHOS 86  BILITOT 0.5  PROT 6.2*  ALBUMIN 3.2*   CBC Recent Labs  Lab 08/04/18 0756 08/10/18 0908  WBC  --  8.8  NEUTROABS  --  6.4  HGB 13.9 12.6  HCT 41.0 39.7  MCV  --  92.8  PLT  --  205  Studies/Results: Dg Abd Portable 1v-small Bowel Protocol-position Verification  Result Date: 08/10/2018 CLINICAL DATA:  Enteric tube placement. EXAM: PORTABLE ABDOMEN - 1 VIEW COMPARISON:  CT chest, abdomen, pelvis dated August 09, 2018. Abdominal x-ray dated January 21, 2018. FINDINGS: Enteric tube tip in the proximal stomach with the distal side port in the lower esophagus. Dilated loops of fecalized small bowel in the central and left abdomen as well as the pelvis. No radio-opaque calculi or other significant radiographic abnormality are seen. Excreted contrast in the bladder. No acute osseous abnormality. IMPRESSION: 1. Enteric tube tip in the proximal stomach with the distal side port in the lower esophagus. Recommend advancing at least 3 cm. 2. Unchanged small bowel obstruction. Electronically Signed   By: Titus Dubin M.D.   On: 08/10/2018 11:08    ROS: All others  negative except those listed in HPI.  Physical Exam: Vitals:   08/10/18 0621 08/10/18 0634 08/10/18 0900  BP: (!) 169/68  (!) 162/71  Pulse: 83  89   Resp: 16  17  Temp: 98.5 F (36.9 C)  98.4 F (36.9 C)  TempSrc: Oral  Oral  SpO2: 99%  100%  Height:  5\' 2"  (1.575 m)      General: WDWN, NAD, chronically ill appearing female Head: NCAT sclera not icteric MMM Neck: Supple. No lymphadenopathy Lungs: mostly CTAB. + faint crackles in bases, +inspiratory wheeze, No rhonchi. Breathing is unlabored. Heart: RRR. No murmur, rubs or gallops.  Abdomen: soft, mildly distended +diffuse tenderness, +BS, +guarding, no rebound tenderness Lower extremities:no edema, ischemic changes, or open wounds  Neuro: AAOx3. Moves all extremities spontaneously. Psych:  Responds to questions appropriately with a normal affect. Dialysis Access: R IJ TDC, LU AVF maturing  Dialysis Orders:  TTS - [place]  4hrs, BFR 400, DFR AF 1.5,  EDW 42.5kg, 2K/ 2.25Ca  Access: TDC, LU AVF maturing  Heparin None Calcitriol 0.75 mcg PO qHD  Assessment/Plan: 1.  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  Surgery following. Conservative management for now. NGT and bowel regimen 2.  ESRD -  On HD TTS.  Orders written for today per regular schedule via TDC. K 5.2, BUN 70.  3.  Hypertension/volume  - BP elevated.  Continue home meds.  Does not appear grossly volume overloaded on exam, will titrate down volume as tolerated. High BP could be due to pain 4.  Anemia of CKD - Hgb 12.6. No indication for ESA at this time.  5.  Secondary Hyperparathyroidism -  Ca in goal. OP phos elevated.  Holding binders now d/t SBO.  Continue VDRA.  6.  Nutrition - currently NPO.  Renal diet w/fluid restrictions once advanced.  7. Hx CAD 8. Bipolar disorder 9. Hx substance abuse 10. COPD  Jen Mow, PA-C [facility] Pager: [phone] 08/10/2018, 12:52 PM   Patient seen and examined, agree with above note with above modifications. Relatively recent ESRD diagnosis presenting with recurrent SBO.  Very uncomfortable when I see her.  Appreciate surgery management.  Will plan for  routine HD today as is her day and K is slightly up as well as BUN  Corliss Parish, MD 08/10/2018

## 2018-08-10 NOTE — H&P (Signed)
History and Physical    Christina [name] QIH:[id number] DOB: 21-Jan-1951 DOA: 08/10/2018  PCP: Maggie Schwalbe, PA-C Consultants:  Otho Perl - cardiology; Donzetta Matters - vascular; Kym.Balm - nephrology; Henrene Pastor - GIBarry Dienes - surgery Patient coming from: Home - lives alone; NOK: Family, Chanda Busing  Chief Complaint: Abdominal pain  HPI: Christina [name] is a 68 y.o. female with medical history significant of ESRD on TTS HD; afib; CAD s/p stents; HTN; HLD; COPD; and recurrent SBO presenting with abdominal pain.  Mid-abdominal pain.  She had a normal BM yesterday, no blood in it.  Pain was too bad to handle.  She has had 3 prior SBOs in the past and has had to have surgical repair, never improved with conservative management.  +nausea/vomiting over the last few days.  She has pain when she has a BM but none at rest - it is significant with limited movement.  No fever.   ED Course:  RH to [facility] transfer, per Dr. Hal Hope:   Abdominal pain of 24 hrs duration. Blood pressure markedly elevated with systolic in the 875'I improved after Labetolol iv to 180. Potassium was 6.5 with no EKG changes. Calcium gluconate D50 iv insulin and bicarbonate was given. CT abdomen shows bowel obstruction. NGT tube be placed. General Surgeon Dr. Kae Heller at [facility] will see patient in consult. COVID negative.   Review of Systems: As per HPI; otherwise review of systems reviewed and negative.   Ambulatory Status:  Ambulates without assistance  Past Medical History:  Diagnosis Date  . Anemia   . Anxiety   . Arthritis   . Asthma   . Atrial fibrillation ([place])   . Chronic kidney disease    Dialysis T/Th/Sa  started in March 2020  . COPD (chronic obstructive pulmonary disease) ([place])   . Coronary artery disease    2 stents  . Depression   . GERD (gastroesophageal reflux disease)   . Gout   . Headache    migraines  . History of kidney stones   . Hyperlipidemia   . Hypertension   . Pneumonia   . Small bowel obstruction [facility])      Past Surgical History:  Procedure Laterality Date  . ABDOMINAL HYSTERECTOMY    . ABDOMINAL SURGERY     for small bowel obstruction - x 2  . APPENDECTOMY    . AV FISTULA PLACEMENT Left 08/04/2018   Procedure: ARTERIOVENOUS (AV) FISTULA CREATION LEFT ARM;  Surgeon: Waynetta Sandy, MD;  Location: [place];  Service: Vascular;  Laterality: Left;  . CARDIAC CATHETERIZATION    . CORONARY ANGIOPLASTY  ?2003/2004  . FACIAL RECONSTRUCTION SURGERY     x 2  . HERNIA REPAIR      Social History   Socioeconomic History  . Marital status: Single    Spouse name: Not on file  . Number of children: Not on file  . Years of education: Not on file  . Highest education level: Not on file  Occupational History  . Occupation: retired  Scientific laboratory technician  . Financial resource strain: Not on file  . Food insecurity    Worry: Not on file    Inability: Not on file  . Transportation needs    Medical: Not on file    Non-medical: Not on file  Tobacco Use  . Smoking status: Former Research scientist (life sciences)  . Smokeless tobacco: Never Used  Substance and Sexual Activity  . Alcohol use: Not Currently    Comment: quit about 6 months ago - "I was  a drunk"  . Drug use: Yes    Types: Marijuana    Comment: h/o drug use - "I was raised in the 60s"; last use 18 months ago; smokes marijuana periodically  . Sexual activity: Not on file  Lifestyle  . Physical activity    Days per week: Not on file    Minutes per session: Not on file  . Stress: Not on file  Relationships  . Social Herbalist on phone: Not on file    Gets together: Not on file    Attends religious service: Not on file    Active member of club or organization: Not on file    Attends meetings of clubs or organizations: Not on file    Relationship status: Not on file  . Intimate partner violence    Fear of current or ex partner: Not on file    Emotionally abused: Not on file    Physically abused: Not on file    Forced sexual activity: Not on  file  Other Topics Concern  . Not on file  Social History Narrative  . Not on file    Allergies  Allergen Reactions  . Cyclobenzaprine Anaphylaxis and Other (See Comments)    "stopped heart"   . Morphine Anaphylaxis    "stopped heart"  . Penicillins Shortness Of Breath, Swelling and Palpitations    Did it involve swelling of the face/tongue/throat, SOB, or low BP? Yes Did it involve sudden or severe rash/hives, skin peeling, or any reaction on the inside of your mouth or nose? No Did you need to seek medical attention at a hospital or doctor's office? No When did it last happen?years  If all above answers are "NO", may proceed with cephalosporin use.   Marland Kitchen Hydromorphone Other (See Comments)    If administered quickly, felt like hand was "exploding"   . Codeine Itching and Rash    History reviewed. No pertinent family history.  Prior to Admission medications   Medication Sig Start Date End Date Taking? Authorizing Provider  acetaminophen (TYLENOL) 500 MG tablet Take 1,000 mg by mouth every 6 (six) hours as needed for moderate pain or headache.    [provider]  albuterol (PROVENTIL) (2.5 MG/3ML) 0.083% nebulizer solution Take 2.5 mg by nebulization every 6 (six) hours as needed for wheezing or shortness of breath.    [provider]  allopurinol (ZYLOPRIM) 100 MG tablet Take 100 mg by mouth daily. 02/18/18   [provider]  amLODipine (NORVASC) 10 MG tablet Take 10 mg by mouth at bedtime. 04/21/18   [provider]  Carboxymethylcellul-Glycerin (LUBRICATING EYE DROPS OP) Place 1 drop into both eyes daily as needed (dry eyes).    [provider]  Cholecalciferol (VITAMIN D3) 50 MCG (2000 UT) capsule Take 2,000 Units by mouth every morning.    [provider]  dicyclomine (BENTYL) 10 MG capsule Take 20 mg by mouth 2 (two) times a day.  08/27/17   [provider]  diphenhydrAMINE (BENADRYL) 25 MG tablet Take 25 mg by  mouth daily as needed for allergies.    [provider]  doxylamine, Sleep, (UNISOM) 25 MG tablet Take 25 mg by mouth at bedtime as needed for sleep.    [provider]  Ipratropium-Albuterol (COMBIVENT) 20-100 MCG/ACT AERS respimat Inhale 2 puffs into the lungs 4 (four) times daily as needed for wheezing. 09/11/17   [provider]  labetalol (NORMODYNE) 200 MG tablet Take 200 mg by  mouth 2 (two) times daily. 03/03/18   [provider]  nicotine (NICODERM CQ - DOSED IN MG/24 HOURS) 21 mg/24hr patch Place 21 mg onto the skin daily as needed (smoking cessation).    [provider]  nitroGLYCERIN (NITROSTAT) 0.4 MG SL tablet Place 0.4 mg under the tongue every 5 (five) minutes as needed for chest pain.  09/18/16   [provider]  pantoprazole (PROTONIX) 40 MG tablet Take 1 tablet (40 mg total) by mouth 2 (two) times daily. Patient taking differently: Take 40 mg by mouth daily.  05/09/18   Regalado, Belkys A, MD  polyethylene glycol (MIRALAX / GLYCOLAX) 17 g packet Take 17 g by mouth daily. Patient taking differently: Take 17 g by mouth 2 (two) times daily.  05/09/18   Regalado, Belkys A, MD  pravastatin (PRAVACHOL) 80 MG tablet Take 80 mg by mouth daily.  06/30/16   [provider]  sennosides-docusate sodium (SENOKOT-S) 8.6-50 MG tablet Take 1 tablet by mouth daily.    [provider]  sevelamer carbonate (RENVELA) 800 MG tablet Take 1,600-2,400 mg by mouth See admin instructions. Take 2400 mg with each meal and 1600 mg with each snack 10/15/17   [provider]  traMADol (ULTRAM) 50 MG tablet Take 1 tablet (50 mg total) by mouth every 6 (six) hours as needed. 08/04/18 08/04/19  Dagoberto Ligas, PA-C    Physical Exam: Vitals:   08/10/18 1730 08/10/18 1800 08/10/18 1830 08/10/18 1900  BP: (!) 186/82 135/70 118/61 116/65  Pulse: 88 86 94 91  Resp: 16 16 14 18   Temp:      TempSrc:      SpO2:  100%    Weight:      Height:          . General:  Appears calm and comfortable and is NAD . Eyes:   EOMI, normal lids, iris . ENT:  grossly normal hearing, lips & tongue, mmm; poor dentition . Neck:  no LAD, masses or thyromegaly . Cardiovascular:  RRR, no m/r/g. No LE edema.  Marland Kitchen Respiratory:   CTA bilaterally with no wheezes/rales/rhonchi.  Normal respiratory effort. . Abdomen:  soft, diffusely TTP, mildly distended, hypoactive BS . Skin:  no rash or induration seen on limited exam . Musculoskeletal:  grossly normal tone BUE/BLE, good ROM, no bony abnormality . Psychiatric:  grossly normal mood and affect, speech fluent and appropriate, AOx3 . Neurologic:  CN 2-12 grossly intact, moves all extremities in coordinated fashion, sensation intact    Radiological Exams on Admission: Dg Abd Portable 1v-small Bowel Protocol-position Verification  Result Date: 08/10/2018 CLINICAL DATA:  Enteric tube placement. EXAM: PORTABLE ABDOMEN - 1 VIEW COMPARISON:  CT chest, abdomen, pelvis dated August 09, 2018. Abdominal x-ray dated January 21, 2018. FINDINGS: Enteric tube tip in the proximal stomach with the distal side port in the lower esophagus. Dilated loops of fecalized small bowel in the central and left abdomen as well as the pelvis. No radio-opaque calculi or other significant radiographic abnormality are seen. Excreted contrast in the bladder. No acute osseous abnormality. IMPRESSION: 1. Enteric tube tip in the proximal stomach with the distal side port in the lower esophagus. Recommend advancing at least 3 cm. 2. Unchanged small bowel obstruction. Electronically Signed   By: Titus Dubin M.D.   On: 08/10/2018 11:08     CTA:  1. Negative for acute aortic dissection 2. Extensive aortic atherosclerosis and irregular mural thickening and thrombus throughout the thoracic and abdominal aorta.  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 3. Dilated mid to distal small  bowel loops with decompressed distal small bowel, consistent with high-grade obstruction.  Suspect transition point is at a fecalized segment of small bowel within the anterior upper pelvis to the left of midline, where there appear to be surgical sutures. 4. Stable bilateral adrenal gland nodules. 5. Emphysematous disease.  Mostly stable scattered lung nodules.  New 5 mm right upper lobe nodule, possibly inflammatory or infectious.  Consider short interval 3 to 6 month CT follow-up. 6. Intrarenal stones within the kidneys.  No hydronephrosis.  EKG: Reviewed by ER doctor at [facility].  NSR with rate 87;no peaked t waves   Labs on Admission: I have personally reviewed the available labs and imaging studies at the time of the admission.  Pertinent labs at [facility]:   K+ 6.9, 6.6 -> 5.3 here Anion gap 18/ 19 -> 17 here BUN 72/Creatinine 6.10 Lipase 589 WBC 11.7 -> 8.8 here Phosphorus 8.2 Mag 2.7 UA: 3+ protein, 1+ glucose MRSA negative COVID reportedly negative at [facility]  Assessment/Plan Principal Problem:   SBO (small bowel obstruction) (HCC) Active Problems:   ESRD (end stage renal disease) (HCC)   Essential hypertension   Hyperlipidemia   SBO -Patient with prior h/o abdominal surgeries presenting with acute onset of abdominal pain with n/v, abdominal distention, and CT findings c/w SBO -Will admit on Med Surg -Gen Surg consulted; currently no indication for surgical intervention -NPO for bowel rest -NG tube in place -IVF hydration not necessary in a dialysis patient -Pain control with fentanyl -Through the day she has had multiple BMs after SMOG enema and so NG tube was clamped and she was allowed clears by surgery -Meanwhile, her NGT got pulled out during transport to HD and so will leave out for now  Uncontrolled HTN -On Norvasc, Labetalol at home and these have been held while NPO -BP as high as 242/112 at [facility] -This may be somewhat due to pain, needs pain control -Given standing q6h IV  labetalol -Hydralazine IV prn  ESRD -Patient on chronic TTS HD -Nephrology prn order set utilized -She does not appear to be volume overloaded or otherwise in need of acute HD -Dr. Moshe Cipro was notified that patient will need HD  COPD -Continue Albuterol prn  HLD -Pravachol held while NPO   Note: This patient has been tested and is negative for the novel coronavirus COVID-19.  DVT prophylaxis: Heparin Code Status:  DNR - confirmed with patient Family Communication: None present and did not request that I contact family Disposition Plan:  Home once clinically improved Consults called: Gen Surgery; Nephrology  Admission status: Admit - It is my clinical opinion that admission to INPATIENT is reasonable and necessary because of the expectation that this patient will require hospital care that crosses at least 2 midnights to treat this condition based on the medical complexity of the problems presented.  Given the aforementioned information, the predictability of an adverse outcome is felt to be significant.    Karmen Bongo MD [facility]   How to contact the [facility] Attending or Consulting provider [place] or covering provider during after hours [place], for this patient?  1. Check the care team in [facility] and look for a) attending/consulting [facility] provider listed and b) the [facility] team listed 2. Log into [email or web address] and use [place]'s universal password to access. If you do not have the password, please  contact the hospital operator. 3. Locate the [facility] provider you are looking for under [facility] and page to a number that you can be directly reached. 4. If you still have difficulty reaching the provider, please page the [facility] (Director on Call) for the Hospitalists listed on amion for assistance.   08/10/2018, 7:24 PM

## 2018-08-10 NOTE — Consult Note (Signed)
Christina [name] 1950-10-02  [id number].    Requesting MD: Dr. Karmen Bongo Chief Complaint/Reason for Consult: SBO  HPI:  This is a 68 yo white female with a history of A fib, off anticoagulation, ESRD on HD, CAD with 2 stents, COPD/emphysema, and multiple prior bowel obstructions.  She has had most of her operations at [facility] with a Dr. Lovie Macadamia.  Her last surgery for a bowel obstruction was in January 2020 at [place].  She has since transitioned to HD and upon arrival to [facility] today was transferred here for HD.    She states she started having some dry heaves and abdominal pain yesterday morning.  She has major issues with constipation and takes stool softeners BID as well as Miralax BID.  She had a large BM this morning prior to coming to [facility] for evaluation.  She still has abdominal pain.  She has a CT scan from [facility] that reveals significant fecalization of the small bowel with concerns for a SBO along with constipation of her colon as well.  She has been transferred here to [facility] and we have been asked to see her.  ROS: ROS: Please see HPI, otherwise all other systems are currently negative.  History reviewed. No pertinent family history.  Past Medical History:  Diagnosis Date  . Anemia   . Anxiety   . Arthritis   . Asthma   . Atrial fibrillation ([place])   . Chronic kidney disease    Dialysis T/Th/Sa  started in March 2020  . COPD (chronic obstructive pulmonary disease) ([place])   . Coronary artery disease    2 stents  . Depression   . GERD (gastroesophageal reflux disease)   . Gout   . Headache    migraines  . History of kidney stones   . Hyperlipidemia   . Hypertension   . Pneumonia   . Small bowel obstruction [facility])     Past Surgical History:  Procedure Laterality Date  . ABDOMINAL HYSTERECTOMY    . ABDOMINAL SURGERY     for small bowel obstruction - x 2  . APPENDECTOMY    . AV FISTULA PLACEMENT Left 08/04/2018   Procedure: ARTERIOVENOUS (AV) FISTULA CREATION LEFT ARM;   Surgeon: Waynetta Sandy, MD;  Location: [place];  Service: Vascular;  Laterality: Left;  . CARDIAC CATHETERIZATION    . CORONARY ANGIOPLASTY  ?2003/2004  . FACIAL RECONSTRUCTION SURGERY     x 2  . HERNIA REPAIR      Social History:  reports that she has quit smoking. She has never used smokeless tobacco. She reports previous alcohol use. She reports current drug use. Drug: Marijuana.  Allergies:  Allergies  Allergen Reactions  . Cyclobenzaprine Anaphylaxis and Other (See Comments)    "stopped heart"   . Morphine Anaphylaxis    "stopped heart"  . Penicillins Shortness Of Breath, Swelling and Palpitations    Did it involve swelling of the face/tongue/throat, SOB, or low BP? Yes Did it involve sudden or severe rash/hives, skin peeling, or any reaction on the inside of your mouth or nose? No Did you need to seek medical attention at a hospital or doctor's office? No When did it last happen?years  If all above answers are "NO", may proceed with cephalosporin use.   Marland Kitchen Hydromorphone Other (See Comments)    If administered quickly, felt like hand was "exploding"   . Codeine Itching and Rash    Medications Prior to Admission  Medication Sig Dispense Refill  .  acetaminophen (TYLENOL) 500 MG tablet Take 1,000 mg by mouth every 6 (six) hours as needed for moderate pain or headache.    . albuterol (PROVENTIL) (2.5 MG/3ML) 0.083% nebulizer solution Take 2.5 mg by nebulization every 6 (six) hours as needed for wheezing or shortness of breath.    . allopurinol (ZYLOPRIM) 100 MG tablet Take 100 mg by mouth daily.    Marland Kitchen amLODipine (NORVASC) 10 MG tablet Take 10 mg by mouth at bedtime.    . Carboxymethylcellul-Glycerin (LUBRICATING EYE DROPS OP) Place 1 drop into both eyes daily as needed (dry eyes).    . Cholecalciferol (VITAMIN D3) 50 MCG (2000 UT) capsule Take 2,000 Units by mouth every morning.    . dicyclomine (BENTYL) 10 MG capsule Take 20 mg by mouth 2 (two) times a day.      . diphenhydrAMINE (BENADRYL) 25 MG tablet Take 25 mg by mouth daily as needed for allergies.    Marland Kitchen doxylamine, Sleep, (UNISOM) 25 MG tablet Take 25 mg by mouth at bedtime as needed for sleep.    . Ipratropium-Albuterol (COMBIVENT) 20-100 MCG/ACT AERS respimat Inhale 2 puffs into the lungs 4 (four) times daily as needed for wheezing.    Marland Kitchen labetalol (NORMODYNE) 200 MG tablet Take 200 mg by mouth 2 (two) times daily.    . nicotine (NICODERM CQ - DOSED IN MG/24 HOURS) 21 mg/24hr patch Place 21 mg onto the skin daily as needed (smoking cessation).    . nitroGLYCERIN (NITROSTAT) 0.4 MG SL tablet Place 0.4 mg under the tongue every 5 (five) minutes as needed for chest pain.     . pantoprazole (PROTONIX) 40 MG tablet Take 1 tablet (40 mg total) by mouth 2 (two) times daily. (Patient taking differently: Take 40 mg by mouth daily. ) 60 tablet 0  . polyethylene glycol (MIRALAX / GLYCOLAX) 17 g packet Take 17 g by mouth daily. (Patient taking differently: Take 17 g by mouth 2 (two) times daily. ) 14 each 0  . pravastatin (PRAVACHOL) 80 MG tablet Take 80 mg by mouth daily.     . sennosides-docusate sodium (SENOKOT-S) 8.6-50 MG tablet Take 1 tablet by mouth daily.    . sevelamer carbonate (RENVELA) 800 MG tablet Take 1,600-2,400 mg by mouth See admin instructions. Take 2400 mg with each meal and 1600 mg with each snack    . traMADol (ULTRAM) 50 MG tablet Take 1 tablet (50 mg total) by mouth every 6 (six) hours as needed. 10 tablet 0     Physical Exam: Blood pressure (!) 162/71, pulse 89, temperature 98.4 F (36.9 C), temperature source Oral, resp. rate 17, height 5\' 2"  (1.575 m), SpO2 100 %. General: pleasant, WD, WN white female who is laying in bed in NAD HEENT: head is normocephalic, atraumatic.  Sclera are noninjected.  PERRL.  Ears and nose without any masses or lesions.  Mouth is pink and moist.  Poor dentition with several missing teeth.  NGT in right nares but not connected to suction. Heart: regular,  rate, and rhythm.  Normal s1,s2. No obvious gallops, or rubs noted. + murmur  Palpable pedal pulses bilaterally Lungs: CTAB, no wheezes, rhonchi, or rales noted.  Respiratory effort nonlabored but on 2L [place] Abd: soft, tender in central abdomen just right lateral to her umbilicus, ND, +BS, no masses, hernias, or organomegaly.  Scars noted from prior abdominal surgeries MS: all 4 extremities are symmetrical with no cyanosis, clubbing, or edema. Skin: warm and dry with no masses, lesions, or rashes Psych:  A&Ox3 with an appropriate affect.   No results found for this or any previous visit (from the past 48 hour(s)). No results found.    Assessment/Plan H/O a fib ESRD, on HD Severe chronic constipation H/o ETOH and tobacco abuse - quit 6 months and 2 months ago respectively HTN CAD/MI x 2, 2 stents in place  SBO The patient has a CT scan that is suggestive of an SBO which is possible given her prior laparotomies.  She has significant fecalization of her small bowel is a chronic finding on her prior CT scans as well.  She deals with severe chronic constipation and takes miralax BID as well as stool softeners.  Some of this simply may be due to a delay in transit and not a true obstruction.  For now we will start with the SBO protocol as she has an NGT in place.  The contrast will reveal if she has an obstruction, but will also help with her constipation if this is the main source of her problem as well.  SMOG enema today from below.  She would like to avoid surgical intervention if at all possible.  We discussed this was our plan moving forward unless she did not resolve or something acutely changed.  She understands and is agreeable to this plan.   FEN - NPO,NGT, IVFs VTE - ok for chemical prophylaxis if ok with medicine ID - none currently  Henreitta Cea, [facility] 08/10/2018, 9:47 AM Pager: [phone]

## 2018-08-10 NOTE — Progress Notes (Signed)
New Admission Note:   Arrival Method: Arrived from [facility] via Care link Mental Orientation: Alert and oriented x4 Telemetry: N/A,Awaiting order Assessment: Completed Skin: See doc flowsheet IV: Rt Wrist,Rt FA Pain: Denies Tubes: N/A Safety Measures: Safety Fall Prevention Plan has been discussed.  Admission: Completed 5MW Orientation: Patient has been orientated to the room, unit and staff.  Family: None at bedside  Orders have been reviewed and implemented. Will continue to monitor the patient. Call light has been placed within reach and bed alarm has been activated.   Sharnese Heath [employer], RN-BC Phone number: [phone]

## 2018-08-11 ENCOUNTER — Other Ambulatory Visit: Payer: Self-pay

## 2018-08-11 DIAGNOSIS — E785 Hyperlipidemia, unspecified: Secondary | ICD-10-CM

## 2018-08-11 DIAGNOSIS — N186 End stage renal disease: Secondary | ICD-10-CM

## 2018-08-11 DIAGNOSIS — K56609 Unspecified intestinal obstruction, unspecified as to partial versus complete obstruction: Principal | ICD-10-CM

## 2018-08-11 DIAGNOSIS — I10 Essential (primary) hypertension: Secondary | ICD-10-CM

## 2018-08-11 LAB — CBC
HCT: 47 % — ABNORMAL HIGH (ref 36.0–46.0)
Hemoglobin: 15 g/dL (ref 12.0–15.0)
MCH: 29.6 pg (ref 26.0–34.0)
MCHC: 31.9 g/dL (ref 30.0–36.0)
MCV: 92.9 fL (ref 80.0–100.0)
Platelets: 207 10*3/uL (ref 150–400)
RBC: 5.06 MIL/uL (ref 3.87–5.11)
RDW: 16.6 % — ABNORMAL HIGH (ref 11.5–15.5)
WBC: 7.2 10*3/uL (ref 4.0–10.5)
nRBC: 0 % (ref 0.0–0.2)

## 2018-08-11 LAB — BASIC METABOLIC PANEL
Anion gap: 18 — ABNORMAL HIGH (ref 5–15)
BUN: 29 mg/dL — ABNORMAL HIGH (ref 8–23)
CO2: 23 mmol/L (ref 22–32)
Calcium: 9.1 mg/dL (ref 8.9–10.3)
Chloride: 97 mmol/L — ABNORMAL LOW (ref 98–111)
Creatinine, Ser: 4.44 mg/dL — ABNORMAL HIGH (ref 0.44–1.00)
GFR calc Af Amer: 11 mL/min — ABNORMAL LOW (ref >60)
GFR calc non Af Amer: 10 mL/min — ABNORMAL LOW (ref >60)
Glucose, Bld: 93 mg/dL (ref 70–99)
Potassium: 4.9 mmol/L (ref 3.5–5.1)
Sodium: 138 mmol/L (ref 135–145)

## 2018-08-11 MED ORDER — POLYETHYLENE GLYCOL 3350 17 G PO PACK
17.0000 g | PACK | Freq: Two times a day (BID) | ORAL | Status: DC
  Administered 2018-08-11: 17 g via ORAL
  Filled 2018-08-11: qty 1

## 2018-08-11 MED ORDER — FERRIC CITRATE 1 GM 210 MG(FE) PO TABS
420.0000 mg | ORAL_TABLET | Freq: Three times a day (TID) | ORAL | Status: DC
  Administered 2018-08-11: 420 mg via ORAL
  Filled 2018-08-11: qty 2

## 2018-08-11 MED ORDER — DOCUSATE SODIUM 100 MG PO CAPS: 100.0000 mg | ORAL_CAPSULE | Freq: Two times a day (BID) | ORAL | 0 refills | Status: AC

## 2018-08-11 MED ORDER — PANTOPRAZOLE SODIUM 40 MG PO TBEC: 40.0000 mg | DELAYED_RELEASE_TABLET | Freq: Every day | ORAL | Status: DC

## 2018-08-11 MED ORDER — DOCUSATE SODIUM 100 MG PO CAPS
100.0000 mg | ORAL_CAPSULE | Freq: Two times a day (BID) | ORAL | Status: DC
  Administered 2018-08-11: 100 mg via ORAL
  Filled 2018-08-11: qty 1

## 2018-08-11 MED ORDER — POLYETHYLENE GLYCOL 3350 17 G PO PACK: 17.0000 g | PACK | Freq: Two times a day (BID) | ORAL | Status: DC

## 2018-08-11 MED ORDER — SEVELAMER CARBONATE 800 MG PO TABS
2400.0000 mg | ORAL_TABLET | Freq: Three times a day (TID) | ORAL | Status: DC
  Administered 2018-08-11: 2400 mg via ORAL
  Filled 2018-08-11: qty 3

## 2018-08-11 MED ORDER — FERRIC CITRATE 1 GM 210 MG(FE) PO TABS: 420.0000 mg | ORAL_TABLET | Freq: Three times a day (TID) | ORAL | 0 refills | Status: DC

## 2018-08-11 MED ORDER — CHLORHEXIDINE GLUCONATE CLOTH 2 % EX PADS: 6.0000 | MEDICATED_PAD | Freq: Every day | CUTANEOUS | Status: DC

## 2018-08-11 NOTE — Progress Notes (Addendum)
[facility] Progress Note   Subjective:   Seen and examined at bedside.  States she is feeling much better today.  Had BM last night.  Denies abdominal pain, n/v, CP, SOB and edema.  HD tolerated well- last night - removed 2500  Objective Vitals:   08/10/18 2035 08/10/18 2134 08/11/18 0448 08/11/18 0913  BP: 126/66 (!) 174/77 (!) 127/59 (!) 203/81  Pulse: 91 89 92 80  Resp: 18 18 20 18   Temp:  98 F (36.7 C) 98.6 F (37 C) 98.1 F (36.7 C)  TempSrc:  Oral Oral Oral  SpO2:  97% 98% 95%  Weight:   43 kg   Height:       Physical Exam General:NAD, chronically ill appearing female, laying in bed Heart:RRR, +0/6 systolic murmur Lungs:CTAB Abdomen:soft, NTND, +bowel sounds Extremities:no LE edema Dialysis Access: R IJ TDC, LU AVF maturing   Filed Weights   08/10/18 1725 08/10/18 2030 08/11/18 0448  Weight: 47.9 kg 45.4 kg 43 kg    Intake/Output Summary (Last 24 hours) at 08/11/2018 1006 Last data filed at 08/11/2018 0900 Gross per 24 hour  Intake 400 ml  Output 2501 ml  Net -2101 ml    Additional Objective Labs: Basic Metabolic Panel: Recent Labs  Lab 08/10/18 0908 08/11/18 0512  NA 138 138  K 5.3* 4.9  CL 100 97*  CO2 21* 23  GLUCOSE 83 93  BUN 70* 29*  CREATININE 6.20* 4.44*  CALCIUM 9.0 9.1   Liver Function Tests: Recent Labs  Lab 08/10/18 0908  AST 17  ALT 13  ALKPHOS 86  BILITOT 0.5  PROT 6.2*  ALBUMIN 3.2*   CBC: Recent Labs  Lab 08/10/18 0908 08/10/18 2142 08/11/18 0512  WBC 8.8 9.7 7.2  NEUTROABS 6.4  --   --   HGB 12.6 15.3* 15.0  HCT 39.7 47.2* 47.0*  MCV 92.8 92.5 92.9  PLT 205 212 207   Studies/Results: Dg Abd Portable 1v-small Bowel Obstruction Protocol-initial, 8 Hr Delay  Result Date: 08/10/2018 CLINICAL DATA:  Bowel obstruction EXAM: PORTABLE ABDOMEN - 1 VIEW COMPARISON:  08/09/2018, 05/30/2018 FINDINGS: Persistent dilatation of small bowel in the left abdomen measuring up to 4 cm. There is scattered colon  gas. There is contrast present within the colon and rectum. There is residual excreted contrast in the bladder. The previously noted esophageal tube is no longer visualized. IMPRESSION: Dilated small bowel in the left mid abdomen. Enteral contrast is present within parts of the colon and rectum. Electronically Signed   By: Donavan Foil M.D.   On: 08/10/2018 20:17   Dg Abd Portable 1v-small Bowel Protocol-position Verification  Result Date: 08/10/2018 CLINICAL DATA:  Enteric tube placement. EXAM: PORTABLE ABDOMEN - 1 VIEW COMPARISON:  CT chest, abdomen, pelvis dated August 09, 2018. Abdominal x-ray dated January 21, 2018. FINDINGS: Enteric tube tip in the proximal stomach with the distal side port in the lower esophagus. Dilated loops of fecalized small bowel in the central and left abdomen as well as the pelvis. No radio-opaque calculi or other significant radiographic abnormality are seen. Excreted contrast in the bladder. No acute osseous abnormality. IMPRESSION: 1. Enteric tube tip in the proximal stomach with the distal side port in the lower esophagus. Recommend advancing at least 3 cm. 2. Unchanged small bowel obstruction. Electronically Signed   By: Titus Dubin M.D.   On: 08/10/2018 11:08    Medications: . sodium chloride    . sodium chloride     . Chlorhexidine Gluconate  Cloth  6 each Topical Q0600  . docusate sodium  100 mg Oral BID  . hydrocortisone  25 mg Rectal BID  . labetalol  5 mg Intravenous Q6H  . polyethylene glycol  17 g Oral BID    Dialysis Orders: TTS - [facility]  4hrs, BFR 400, DFR AF 1.5,  EDW 42.5kg, 2K/ 2.25Ca  Access: TDC, LU AVF maturing  Heparin None Calcitriol 0.75 mcg PO qHD  Assessment/Plan: 1.  Recurrent SBO - identified on CT.Pain resolved, likely d/t chronic severe constipation. Surgery following. miralax & colace BID 2.  ESRD -  On HD TTS.  K 4.3. HD yesterday tolerated well. Orders written for HD tomorrow if remains inpatient.  Can return to normal  schedule at OP unit if d/c.  3.  Hypertension/volume  - BP variable.  Continue home meds.  Net UF 2.5L removed. Close to dry. 4.  Anemia of CKD - Hgb 15.0. No indication for ESA at this time.  5.  Secondary Hyperparathyroidism -  Ca in goal. OP phos elevated.  Restart binders - renvela. Renvela is likely not the best choice for her- will change to [place] as is less constipating Continue VDRA.  6.  Nutrition - Renal diet w/fluid restrictions. 7. Hx CAD 8. Bipolar disorder 9. Hx substance abuse 10. COPD  Jen Mow, PA-C [facility] Pager: [phone] 08/11/2018,10:06 AM  LOS: 1 day    Patient seen and examined, agree with above note with above modifications. Looks like a brand new person, eating lunch.  Indications are that she will go home today.  Will stop renvela- bad choice for her and give Lorin Picket- can start as an OP  Corliss Parish, MD 08/11/2018

## 2018-08-11 NOTE — Progress Notes (Signed)
Renal Navigator notified OP HD clinic/[facility] of patient's admission and negative COVID 19 test result (reported by [facility] per notes) in order to provide continuity of care.  Alphonzo Cruise, [place] Renal Navigator [phone]

## 2018-08-11 NOTE — Plan of Care (Signed)
  Problem: Activity: Goal: Risk for activity intolerance will decrease Outcome: Progressing   

## 2018-08-11 NOTE — Progress Notes (Signed)
Patient ID: Christina [name], female   DOB: 1950/04/10, 68 y.o.   MRN: [id number]       Subjective: Patient feels great today.  Tolerating clear liquids.  Having multiple bowel movements.  No abdominal pain.  Objective: Vital signs in last 24 hours: Temp:  [98 F (36.7 C)-98.7 F (37.1 C)] 98.6 F (37 C) (07/15 0448) Pulse Rate:  [80-94] 92 (07/15 0448) Resp:  [14-20] 20 (07/15 0448) BP: (116-186)/(59-82) 127/59 (07/15 0448) SpO2:  [97 %-100 %] 98 % (07/15 0448) Weight:  [43 kg-47.9 kg] 43 kg (07/15 0448) Last BM Date: 08/10/18  Intake/Output from previous day: 07/14 0701 - 07/15 0700 In: 100 [P.O.:100] Out: 2501 [Stool:1] Intake/Output this shift: No intake/output data recorded.  PE: Gen: NAD Abd: soft, NT, Nd, +BS  Lab Results:  Recent Labs    08/10/18 2142 08/11/18 0512  WBC 9.7 7.2  HGB 15.3* 15.0  HCT 47.2* 47.0*  PLT 212 207   BMET Recent Labs    08/10/18 0908 08/11/18 0512  NA 138 138  K 5.3* 4.9  CL 100 97*  CO2 21* 23  GLUCOSE 83 93  BUN 70* 29*  CREATININE 6.20* 4.44*  CALCIUM 9.0 9.1   PT/INR No results for input(s): LABPROT, INR in the last 72 hours. CMP     Component Value Date/Time   NA 138 08/11/2018 0512   K 4.9 08/11/2018 0512   CL 97 (L) 08/11/2018 0512   CO2 23 08/11/2018 0512   GLUCOSE 93 08/11/2018 0512   BUN 29 (H) 08/11/2018 0512   CREATININE 4.44 (H) 08/11/2018 0512   CALCIUM 9.1 08/11/2018 0512   PROT 6.2 (L) 08/10/2018 0908   ALBUMIN 3.2 (L) 08/10/2018 0908   AST 17 08/10/2018 0908   ALT 13 08/10/2018 0908   ALKPHOS 86 08/10/2018 0908   BILITOT 0.5 08/10/2018 0908   GFRNONAA 10 (L) 08/11/2018 0512   GFRAA 11 (L) 08/11/2018 0512   Lipase  No results found for: LIPASE     Studies/Results: Dg Abd Portable 1v-small Bowel Obstruction Protocol-initial, 8 Hr Delay  Result Date: 08/10/2018 CLINICAL DATA:  Bowel obstruction EXAM: PORTABLE ABDOMEN - 1 VIEW COMPARISON:  08/09/2018, 05/30/2018 FINDINGS: Persistent  dilatation of small bowel in the left abdomen measuring up to 4 cm. There is scattered colon gas. There is contrast present within the colon and rectum. There is residual excreted contrast in the bladder. The previously noted esophageal tube is no longer visualized. IMPRESSION: Dilated small bowel in the left mid abdomen. Enteral contrast is present within parts of the colon and rectum. Electronically Signed   By: Donavan Foil M.D.   On: 08/10/2018 20:17   Dg Abd Portable 1v-small Bowel Protocol-position Verification  Result Date: 08/10/2018 CLINICAL DATA:  Enteric tube placement. EXAM: PORTABLE ABDOMEN - 1 VIEW COMPARISON:  CT chest, abdomen, pelvis dated August 09, 2018. Abdominal x-ray dated January 21, 2018. FINDINGS: Enteric tube tip in the proximal stomach with the distal side port in the lower esophagus. Dilated loops of fecalized small bowel in the central and left abdomen as well as the pelvis. No radio-opaque calculi or other significant radiographic abnormality are seen. Excreted contrast in the bladder. No acute osseous abnormality. IMPRESSION: 1. Enteric tube tip in the proximal stomach with the distal side port in the lower esophagus. Recommend advancing at least 3 cm. 2. Unchanged small bowel obstruction. Electronically Signed   By: Titus Dubin M.D.   On: 08/10/2018 11:08    Anti-infectives: Anti-infectives (From admission,  onward)   None       Assessment/Plan H/O a fib ESRD, on HD Severe chronic constipation H/o ETOH and tobacco abuse - quit 6 months and 2 months ago respectively HTN CAD/MI x 2, 2 stents in place  SBO -resolved as this was due to chronic severe constipation. -adv to renal diet -miralax BID and colace BID -if patient tolerates diet is stable for DC home from our standpoint when medically stable.     FEN - renal diet VTE - ok for chemical prophylaxis if ok with medicine ID - none currently  Patient may follow up with PCP or surgeon in [facility] as needed.  No further acute surgical needs.   LOS: 1 day    Henreitta Cea , [facility] Surgery 08/11/2018, 9:12 AM Pager: [phone]

## 2018-08-11 NOTE — Progress Notes (Signed)
Christina [name] to be discharged Home  per MD order. Discussed prescriptions and follow up appointments with the patient. Prescriptions given to patient; medication list explained in detail. Patient verbalized understanding.  Skin clean, dry and intact without evidence of skin break down, no evidence of skin tears noted. IV catheter discontinued intact. Site without signs and symptoms of complications. Dressing and pressure applied. Pt denies pain at the site currently. No complaints noted.  Patient free of lines, drains, and wounds.   An After Visit Summary (AVS) was printed and given to the patient. Patient escorted via wheelchair, and discharged home via private auto.  Shela Commons, RN

## 2018-08-11 NOTE — Discharge Instructions (Signed)
Take Miralax twice a day at home to keep bowels moving along with a stool softener twice a day.   Additional discharge instructions  Please get your medications reviewed and adjusted by your Primary MD.  Please request your Primary MD to go over all Hospital Tests and Procedure/Radiological results at the follow up, please get all Hospital records sent to your Prim MD by signing hospital release before you go home.  If you had Pneumonia of Lung problems at the Hospital: Please get a 2 view Chest X ray done in 6-8 weeks after hospital discharge or sooner if instructed by your Primary MD.  If you have Congestive Heart Failure: Please call your Cardiologist or Primary MD anytime you have any of the following symptoms:  1) 3 pound weight gain in 24 hours or 5 pounds in 1 week  2) shortness of breath, with or without a dry hacking cough  3) swelling in the hands, feet or stomach  4) if you have to sleep on extra pillows at night in order to breathe  Follow cardiac low salt diet and 1.5 lit/day fluid restriction.  If you have diabetes Accuchecks 4 times/day, Once in AM empty stomach and then before each meal. Log in all results and show them to your primary doctor at your next visit. If any glucose reading is under 80 or above 300 call your primary MD immediately.  If you have Seizure/Convulsions/Epilepsy: Please do not drive, operate heavy machinery, participate in activities at heights or participate in high speed sports until you have seen by Primary MD or a Neurologist and advised to do so again.  If you had Gastrointestinal Bleeding: Please ask your Primary MD to check a complete blood count within one week of discharge or at your next visit. Your endoscopic/colonoscopic biopsies that are pending at the time of discharge, will also need to followed by your Primary MD.  Get Medicines reviewed and adjusted. Please take all your medications with you for your next visit with your Primary  MD  Please request your Primary MD to go over all hospital tests and procedure/radiological results at the follow up, please ask your Primary MD to get all Hospital records sent to his/her office.  If you experience worsening of your admission symptoms, develop shortness of breath, life threatening emergency, suicidal or homicidal thoughts you must seek medical attention immediately by calling 911 or calling your MD immediately  if symptoms less severe.  You must read complete instructions/literature along with all the possible adverse reactions/side effects for all the Medicines you take and that have been prescribed to you. Take any new Medicines after you have completely understood and accpet all the possible adverse reactions/side effects.   Do not drive or operate heavy machinery when taking Pain medications.   Do not take more than prescribed Pain, Sleep and Anxiety Medications  Special Instructions: If you have smoked or chewed Tobacco  in the last 2 yrs please stop smoking, stop any regular Alcohol  and or any Recreational drug use.  Wear Seat belts while driving.  Please note You were cared for by a hospitalist during your hospital stay. If you have any questions about your discharge medications or the care you received while you were in the hospital after you are discharged, you can call the unit and asked to speak with the hospitalist on call if the hospitalist that took care of you is not available. Once you are discharged, your primary care physician will handle any  further medical issues. Please note that NO REFILLS for any discharge medications will be authorized once you are discharged, as it is imperative that you return to your primary care physician (or establish a relationship with a primary care physician if you do not have one) for your aftercare needs so that they can reassess your need for medications and monitor your lab values.  You can reach the hospitalist office at  phone [phone] or fax [phone]   If you do not have a primary care physician, you can call [phone] for a physician referral.

## 2018-08-11 NOTE — Discharge Summary (Signed)
Physician Discharge Summary  Christina [name]:[id number] DOB: 07-25-1950  PCP: Maggie Schwalbe, PA-C  Admit date: 08/10/2018 Discharge date: 08/11/2018  Recommendations for Outpatient Follow-up:  1. [name], PA-C/PCP in 1 week with repeat labs (CBC & BMP). 2. Hemodialysis Center: Advised to continue regular hemodialysis appointments on Tuesdays, Thursdays and Saturdays, next on 08/12/2018.  Home Health: None. Equipment/Devices: None  Discharge Condition: Improved and stable CODE STATUS: DNR Diet recommendation: Heart healthy diet.  Discharge Diagnoses:  Principal Problem:   SBO (small bowel obstruction) (HCC) Active Problems:   ESRD (end stage renal disease) ([facility])   Essential hypertension   Hyperlipidemia   Brief Summary: 68 year old female, lives alone and is independent, PMH of ESRD on TTS HD, A. 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  She was transferred from [facility] to [facility] for evaluation and management.  CT abdomen showed small bowel obstruction.  General surgery was consulted.  Assessment and plan:  1. Recurrent small bowel obstruction: General surgery was consulted.  She has had most of her surgeries at [facility] with Dr. Lovie Macadamia and last surgery for bowel obstruction was in January 2022 at [facility].  She has issues with chronic constipation for which she is on bowel regimen at home.  CT abdomen from [facility] revealed significant fecalization of the small bowel with concerns for a small bowel obstruction along with constipation.  She was treated conservatively with NG tube, SBO protocol, bowel rest/n.p.o., IV fluids and SMOG enema.  With these measures she had prompt response, she had  multiple large BMs with resolution of her nausea, vomiting and abdominal pain.  NG tube accidentally fell off on day of admission and did not have to be replaced.  She tolerated clear liquid diet and also advancing to regular consistency diet.  General surgery has seen her today.  Her SBO has resolved and suspect that this was due to chronic severe constipation.  She is advised to continue to take MiraLAX twice daily, prior home dose of Senokot-S daily and Colace twice daily.  Outpatient follow-up with her PCP or surgeon at [facility] as needed. 2. Chronic constipation: Bowel regimen as noted above. 3. ESRD on TTS HD: Nephrology was consulted and underwent HD on 7/14.  Nephrology has seen and cleared for discharge and return to normal outpatient schedule at discharge.  I communicated with Nephrology regarding discharge today. 4. Hyperkalemia: Mild/5.3 on admission.  Resolved after HD. 5. Essential hypertension: Mildly uncontrolled at times.  It was markedly elevated, 242/112 at [facility] probably related to pain and stress response.  She was placed in the hospital on scheduled IV labetalol and PRN IV hydralazine with improvement.  At discharge, continue home dose of amlodipine and labetalol at discharge.  Clinically appears euvolemic. 6. Anemia of CKD: Hemoglobin 15.  No indication for ESA at this time. 7. Secondary hyperparathyroidism: As per nephrology, Renvela is likely not the best choice for her and was changed in the hospital to [place].  However Lorin Picket is not covered by patient's insurance as per my communication with her pharmacy.  I discussed with Dr. Moshe Cipro, Nephrology who recommended leaving her off of both Renvela and Auryxia and this will be reviewed during outpatient HD visit. 8. COPD/asthma: Stable without clinical  bronchospasm.  Continue home as needed inhalers.  No history of ongoing tobacco abuse and not on home oxygen. 9. CAD status post stents: No angina symptoms.   Continue statins and labetalol. 10. Hyperlipidemia: Continue statins. 11. A. fib: Continue labetalol.  Not on anticoagulation prior to admission.  Unknown if this is paroxysmal, persistent or chronic.   Consultations:  General surgery  Nephrology  Procedures:  NG tube-fell off 7/14  HD on 7/14.   Discharge Instructions  Discharge Instructions    Activity as tolerated - No restrictions   Complete by: As directed    Call MD for:  difficulty breathing, headache or visual disturbances   Complete by: As directed    Call MD for:  extreme fatigue   Complete by: As directed    Call MD for:  persistant dizziness or light-headedness   Complete by: As directed    Call MD for:  persistant nausea and vomiting   Complete by: As directed    Call MD for:  severe uncontrolled pain   Complete by: As directed    Call MD for:  temperature >100.4   Complete by: As directed    Diet - low sodium heart healthy   Complete by: As directed        Medication List    STOP taking these medications   sevelamer carbonate 800 MG tablet Commonly known as: RENVELA     TAKE these medications   acetaminophen 500 MG tablet Commonly known as: TYLENOL Take 1,000 mg by mouth every 6 (six) hours as needed for moderate pain or headache.   albuterol (2.5 MG/3ML) 0.083% nebulizer solution Commonly known as: PROVENTIL Take 2.5 mg by nebulization every 6 (six) hours as needed for wheezing or shortness of breath.   allopurinol 100 MG tablet Commonly known as: ZYLOPRIM Take 100 mg by mouth daily.   amLODipine 10 MG tablet Commonly known as: NORVASC Take 10 mg by mouth at bedtime.   dicyclomine 10 MG capsule Commonly known as: BENTYL Take 20 mg by mouth 2 (two) times a day.   diphenhydrAMINE 25 MG tablet Commonly known as: BENADRYL Take 25 mg by mouth daily as needed for allergies.   docusate sodium 100 MG capsule Commonly known as: COLACE Take 1 capsule (100 mg total) by mouth 2 (two) times  daily.   doxylamine (Sleep) 25 MG tablet Commonly known as: UNISOM Take 25 mg by mouth at bedtime as needed for sleep.   Ipratropium-Albuterol 20-100 MCG/ACT Aers respimat Commonly known as: COMBIVENT Inhale 2 puffs into the lungs 4 (four) times daily as needed for wheezing.   labetalol 200 MG tablet Commonly known as: NORMODYNE Take 200 mg by mouth 2 (two) times daily.   LUBRICATING EYE DROPS OP Place 1 drop into both eyes daily as needed (dry eyes).   nicotine 21 mg/24hr patch Commonly known as: NICODERM CQ - dosed in mg/24 hours Place 21 mg onto the skin daily as needed (smoking cessation).   nitroGLYCERIN 0.4 MG SL tablet Commonly known as: NITROSTAT Place 0.4 mg under the tongue every 5 (five) minutes as needed for chest pain.   pantoprazole 40 MG tablet Commonly known as: PROTONIX Take 1 tablet (40 mg total) by mouth daily.   polyethylene glycol 17 g packet Commonly known as: MIRALAX / GLYCOLAX Take 17 g by mouth 2 (two) times daily.   pravastatin 80 MG tablet Commonly known as: PRAVACHOL Take 80 mg by mouth daily.   sennosides-docusate sodium 8.6-50 MG tablet Commonly  known as: SENOKOT-S Take 1 tablet by mouth daily.   traMADol 50 MG tablet Commonly known as: Ultram Take 1 tablet (50 mg total) by mouth every 6 (six) hours as needed. What changed: reasons to take this   Vitamin D3 50 MCG (2000 UT) capsule Take 2,000 Units by mouth every morning.      Follow-up Information    Nodal, [name], PA-C. Schedule an appointment as soon as possible for a visit in 1 week(s).   Specialty: Physician Assistant Why: To be seen with repeat labs (CBC & BMP). Contact information: [place] [address] [phone]        Hemodialysis Center Follow up on 08/12/2018.   Why: Continue regular outpatient hemodialysis on Tuesdays, Thursdays and Saturdays.         Allergies  Allergen Reactions  . Cyclobenzaprine Anaphylaxis and Other  (See Comments)    "stopped heart"   . Morphine Anaphylaxis    "stopped heart"  . Penicillins Shortness Of Breath, Swelling and Palpitations    Did it involve swelling of the face/tongue/throat, SOB, or low BP? Yes Did it involve sudden or severe rash/hives, skin peeling, or any reaction on the inside of your mouth or nose? No Did you need to seek medical attention at a hospital or doctor's office? No When did it last happen?years  If all above answers are "NO", may proceed with cephalosporin use.   Marland Kitchen Hydromorphone Other (See Comments)    If administered quickly, felt like hand was "exploding"   . Codeine Itching and Rash      Procedures/Studies: Dg Abd Portable 1v-small Bowel Obstruction Protocol-initial, 8 Hr Delay  Result Date: 08/10/2018 CLINICAL DATA:  Bowel obstruction EXAM: PORTABLE ABDOMEN - 1 VIEW COMPARISON:  08/09/2018, 05/30/2018 FINDINGS: Persistent dilatation of small bowel in the left abdomen measuring up to 4 cm. There is scattered colon gas. There is contrast present within the colon and rectum. There is residual excreted contrast in the bladder. The previously noted esophageal tube is no longer visualized. IMPRESSION: Dilated small bowel in the left mid abdomen. Enteral contrast is present within parts of the colon and rectum. Electronically Signed   By: Donavan Foil M.D.   On: 08/10/2018 20:17   Dg Abd Portable 1v-small Bowel Protocol-position Verification  Result Date: 08/10/2018 CLINICAL DATA:  Enteric tube placement. EXAM: PORTABLE ABDOMEN - 1 VIEW COMPARISON:  CT chest, abdomen, pelvis dated August 09, 2018. Abdominal x-ray dated January 21, 2018. FINDINGS: Enteric tube tip in the proximal stomach with the distal side port in the lower esophagus. Dilated loops of fecalized small bowel in the central and left abdomen as well as the pelvis. No radio-opaque calculi or other significant radiographic abnormality are seen. Excreted contrast in the bladder. No acute  osseous abnormality. IMPRESSION: 1. Enteric tube tip in the proximal stomach with the distal side port in the lower esophagus. Recommend advancing at least 3 cm. 2. Unchanged small bowel obstruction. Electronically Signed   By: Titus Dubin M.D.   On: 08/10/2018 11:08    Subjective: Overall patient feels much better.  She denies any complaints.  No further nausea, vomiting or abdominal pain.  Tolerated liquid diet without complaints.  Had multiple BMs yesterday and a large one this morning.  Discharge Exam:  Vitals:   08/10/18 2035 08/10/18 2134 08/11/18 0448 08/11/18 0913  BP: 126/66 (!) 174/77 (!) 127/59 (!) 203/81  Pulse: 91 89 92 80  Resp: 18 18 20  18  Temp:  98 F (36.7 C) 98.6 F (37 C) 98.1 F (36.7 C)  TempSrc:  Oral Oral Oral  SpO2:  97% 98% 95%  Weight:   43 kg   Height:        General: Pleasant middle-aged female, small built and thinly nourished lying comfortably propped up in bed without distress.  Oral mucosa moist. Cardiovascular: S1 & S2 heard, RRR, S1/S2 +. No murmurs, rubs, gallops or clicks. No JVD or pedal edema.  Not on telemetry. Respiratory: Clear to auscultation without wheezing, rhonchi or crackles. No increased work of breathing.  Right upper chest HD catheter present without acute findings. Abdominal:  Non distended, non tender & soft. No organomegaly or masses appreciated. Normal bowel sounds heard.  Subumbilical midline laparotomy scar. CNS: Alert and oriented. No focal deficits. Extremities: no edema, no cyanosis    The results of significant diagnostics from this hospitalization (including imaging, microbiology, ancillary and laboratory) are listed below for reference.     Microbiology: Recent Results (from the past 240 hour(s))  SARS Coronavirus 2 (Performed in [place] hospital lab)     Status: None   Collection Time: 08/02/18  1:43 PM   Specimen: Nasal Swab  Result Value Ref Range Status   SARS Coronavirus 2 NEGATIVE NEGATIVE Final     Comment: (NOTE) SARS-CoV-2 target nucleic acids are NOT DETECTED. The SARS-CoV-2 RNA is generally detectable in upper and lower respiratory specimens during the acute phase of infection. Negative results do not preclude SARS-CoV-2 infection, do not rule out co-infections with other pathogens, and should not be used as the sole basis for treatment or other patient management decisions. Negative results must be combined with clinical observations, patient history, and epidemiological information. The expected result is Negative. Fact Sheet for Patients: SugarRoll.be Fact Sheet for Healthcare Providers: https://www.woods-mathews.com/ This test is not yet approved or cleared by the [place] FDA and  has been authorized for detection and/or diagnosis of SARS-CoV-2 by FDA under an Emergency Use Authorization (EUA). This EUA will remain  in effect (meaning this test can be used) for the duration of the COVID-19 declaration under Section 56 4(b)(1) of the Act, 21 U.S.C. section 360bbb-3(b)(1), unless the authorization is terminated or revoked sooner. Performed at [facility], [place] [address], [place], [place] [address]   MRSA PCR Screening     Status: None   Collection Time: 08/10/18 11:40 AM   Specimen: Nasal Mucosa; Nasopharyngeal  Result Value Ref Range Status   MRSA by PCR NEGATIVE NEGATIVE Final    Comment:        The GeneXpert MRSA Assay (FDA approved for NASAL specimens only), is one component of a comprehensive MRSA colonization surveillance program. It is not intended to diagnose MRSA infection nor to guide or monitor treatment for MRSA infections. Performed at [facility], [place] [address], [place], [place] [address]      Labs: CBC: Recent Labs  Lab 08/10/18 0908 08/10/18 2142 08/11/18 0512  WBC 8.8 9.7 7.2  NEUTROABS 6.4  --   --   HGB 12.6 15.3* 15.0  HCT 39.7 47.2* 47.0*  MCV 92.8 92.5 92.9  PLT 205  212 287   Basic Metabolic Panel: Recent Labs  Lab 08/10/18 0908 08/11/18 0512  NA 138 138  K 5.3* 4.9  CL 100 97*  CO2 21* 23  GLUCOSE 83 93  BUN 70* 29*  CREATININE 6.20* 4.44*  CALCIUM 9.0 9.1   Liver Function Tests: Recent Labs  Lab 08/10/18 0908  AST 17  ALT 13  ALKPHOS 86  BILITOT 0.5  PROT 6.2*  ALBUMIN 3.2*      Time coordinating discharge: 25 minutes  SIGNED:  Vernell Leep, MD, FACP, [facility]  To contact the attending provider between 7A-7P or the covering provider during after hours 7P-7A, please log into the web site [email or web address] and access using universal [place] password for that web site. If you do not have the password, please call the hospital operator.

## 2018-09-08 ENCOUNTER — Other Ambulatory Visit: Payer: Self-pay

## 2018-09-08 DIAGNOSIS — N186 End stage renal disease: Secondary | ICD-10-CM

## 2018-09-15 ENCOUNTER — Emergency Department (HOSPITAL_COMMUNITY)
Admission: EM | Admit: 2018-09-15 | Discharge: 2018-09-15 | Disposition: A | Discharge status: Home / Self Care | Payer: Medicare Other | Attending: Emergency Medicine | Admitting: Emergency Medicine

## 2018-09-15 ENCOUNTER — Encounter (HOSPITAL_COMMUNITY): Payer: Self-pay | Admitting: Emergency Medicine

## 2018-09-15 ENCOUNTER — Emergency Department (HOSPITAL_COMMUNITY): Payer: Medicare Other

## 2018-09-15 ENCOUNTER — Other Ambulatory Visit: Payer: Self-pay

## 2018-09-15 DIAGNOSIS — Z992 Dependence on renal dialysis: Secondary | ICD-10-CM | POA: Insufficient documentation

## 2018-09-15 DIAGNOSIS — Z87891 Personal history of nicotine dependence: Secondary | ICD-10-CM | POA: Diagnosis not present

## 2018-09-15 DIAGNOSIS — N186 End stage renal disease: Secondary | ICD-10-CM | POA: Insufficient documentation

## 2018-09-15 DIAGNOSIS — Z79899 Other long term (current) drug therapy: Secondary | ICD-10-CM | POA: Diagnosis not present

## 2018-09-15 DIAGNOSIS — R1033 Periumbilical pain: Secondary | ICD-10-CM | POA: Diagnosis present

## 2018-09-15 DIAGNOSIS — I4891 Unspecified atrial fibrillation: Secondary | ICD-10-CM | POA: Insufficient documentation

## 2018-09-15 DIAGNOSIS — I120 Hypertensive chronic kidney disease with stage 5 chronic kidney disease or end stage renal disease: Secondary | ICD-10-CM | POA: Diagnosis not present

## 2018-09-15 DIAGNOSIS — J45909 Unspecified asthma, uncomplicated: Secondary | ICD-10-CM | POA: Insufficient documentation

## 2018-09-15 DIAGNOSIS — K529 Noninfective gastroenteritis and colitis, unspecified: Secondary | ICD-10-CM

## 2018-09-15 DIAGNOSIS — I259 Chronic ischemic heart disease, unspecified: Secondary | ICD-10-CM | POA: Insufficient documentation

## 2018-09-15 LAB — URINALYSIS, ROUTINE W REFLEX MICROSCOPIC
Bilirubin Urine: NEGATIVE
Glucose, UA: NEGATIVE mg/dL
Hgb urine dipstick: NEGATIVE
Ketones, ur: NEGATIVE mg/dL
Nitrite: NEGATIVE
Protein, ur: 100 mg/dL — AB
Specific Gravity, Urine: 1.009 (ref 1.005–1.030)
pH: 6 (ref 5.0–8.0)

## 2018-09-15 LAB — CBC
HCT: 40.8 % (ref 36.0–46.0)
Hemoglobin: 13 g/dL (ref 12.0–15.0)
MCH: 29.7 pg (ref 26.0–34.0)
MCHC: 31.9 g/dL (ref 30.0–36.0)
MCV: 93.2 fL (ref 80.0–100.0)
Platelets: 207 10*3/uL (ref 150–400)
RBC: 4.38 MIL/uL (ref 3.87–5.11)
RDW: 17.2 % — ABNORMAL HIGH (ref 11.5–15.5)
WBC: 9.4 10*3/uL (ref 4.0–10.5)
nRBC: 0 % (ref 0.0–0.2)

## 2018-09-15 LAB — COMPREHENSIVE METABOLIC PANEL
ALT: 14 U/L (ref 0–44)
AST: 16 U/L (ref 15–41)
Albumin: 3.5 g/dL (ref 3.5–5.0)
Alkaline Phosphatase: 97 U/L (ref 38–126)
Anion gap: 16 — ABNORMAL HIGH (ref 5–15)
BUN: 69 mg/dL — ABNORMAL HIGH (ref 8–23)
CO2: 20 mmol/L — ABNORMAL LOW (ref 22–32)
Calcium: 8 mg/dL — ABNORMAL LOW (ref 8.9–10.3)
Chloride: 96 mmol/L — ABNORMAL LOW (ref 98–111)
Creatinine, Ser: 6.49 mg/dL — ABNORMAL HIGH (ref 0.44–1.00)
GFR calc Af Amer: 7 mL/min — ABNORMAL LOW (ref >60)
GFR calc non Af Amer: 6 mL/min — ABNORMAL LOW (ref >60)
Glucose, Bld: 98 mg/dL (ref 70–99)
Potassium: 6 mmol/L — ABNORMAL HIGH (ref 3.5–5.1)
Sodium: 132 mmol/L — ABNORMAL LOW (ref 135–145)
Total Bilirubin: 0.7 mg/dL (ref 0.3–1.2)
Total Protein: 6.8 g/dL (ref 6.5–8.1)

## 2018-09-15 LAB — LIPASE, BLOOD: Lipase: 86 U/L — ABNORMAL HIGH (ref 11–51)

## 2018-09-15 MED ORDER — ONDANSETRON HCL 4 MG/2ML IJ SOLN
4.0000 mg | Freq: Once | INTRAMUSCULAR | Status: AC
  Administered 2018-09-15: 4 mg via INTRAVENOUS
  Filled 2018-09-15: qty 2

## 2018-09-15 MED ORDER — HYDROMORPHONE HCL 1 MG/ML IJ SOLN
1.0000 mg | Freq: Once | INTRAMUSCULAR | Status: AC
  Administered 2018-09-15: 1 mg via INTRAVENOUS
  Filled 2018-09-15: qty 1

## 2018-09-15 MED ORDER — HYDROCODONE-ACETAMINOPHEN 5-325 MG PO TABS: 1.0000 | ORAL_TABLET | Freq: Four times a day (QID) | ORAL | 0 refills | Status: DC | PRN

## 2018-09-15 NOTE — ED Notes (Signed)
Patient transported to CT 

## 2018-09-15 NOTE — ED Provider Notes (Signed)
[facility] EMERGENCY DEPARTMENT Provider Note   CSN: [id number] Arrival date & time: 09/15/18  1601     History   Chief Complaint Chief Complaint  Patient presents with  . Abdominal Pain    HPI Christina [name] is a 68 y.o. female.     Patient is a 68 year old female with multiple medical problems including end-stage renal disease on dialysis Tuesday Thursday Saturday, COPD, CAD, hypertension, and recurrent small bowel obstruction status post multiple surgeries for lysis of adhesions who is presenting today with abdominal pain, vomiting and occasional diarrhea.  Patient states the symptoms started about 2 days ago.  Because of the vomiting, diarrhea and abdominal pain she went to [facility] yesterday.  She states while she was waiting the pain resolved and they did blood work and stool sample and told her she had an intestinal infection and was discharged home with a prescription for nausea medication.  Patient did miss dialysis yesterday so was going to dialysis today for a make-up session.  On the way there she started having abdominal pain again around her umbilicus which she describes as sharp stabbing and throbbing.  It does not radiate him by the time she got to dialysis she was too uncomfortable to continue with her session so did not get dialyzed today.  Currently she states her pain is starting to get a little bit better and is a 5 out of 10.  She was having some nausea but that has resolved.  She denies fever, cough, shortness of breath.  She has not had any stool since yesterday.  She is not currently taking any antibiotics.  The history is provided by the patient.  Abdominal Pain Pain location:  Periumbilical Pain quality: aching, cramping and throbbing   Pain radiates to:  Does not radiate Pain severity:  Moderate Onset quality:  Sudden Duration:  2 days Timing:  Intermittent Progression:  Waxing and waning Chronicity:  New Context: previous  surgery   Context: not alcohol use, not medication withdrawal, not recent illness and not sick contacts   Relieved by:  None tried Worsened by:  Eating Ineffective treatments:  None tried Associated symptoms: anorexia, diarrhea, nausea and vomiting   Associated symptoms: no chest pain, no cough, no dysuria, no fever, no hematuria and no shortness of breath   Risk factors: multiple surgeries   Risk factors: no alcohol abuse     Past Medical History:  Diagnosis Date  . Anemia   . Anxiety   . Arthritis   . Asthma   . Atrial fibrillation ([place])   . Chronic kidney disease    Dialysis T/Th/Sa  started in March 2020  . COPD (chronic obstructive pulmonary disease) ([place])   . Coronary artery disease    2 stents  . Depression   . GERD (gastroesophageal reflux disease)   . Gout   . Headache    migraines  . History of kidney stones   . Hyperlipidemia   . Hypertension   . Pneumonia   . Small bowel obstruction [facility])     Patient Active Problem List   Diagnosis Date Noted  . SBO (small bowel obstruction) ([place]) 08/10/2018  . Anemia due to chronic blood loss   . Heme positive stool   . Platelet inhibition due to Plavix   . Acute GI bleeding 05/06/2018  . ESRD (end stage renal disease) ([place]) 05/06/2018  . Anxiety 05/06/2018  . Bipolar affective ([place]) 05/06/2018  . CAD (coronary artery disease), native coronary  artery 05/06/2018  . COPD with chronic bronchitis ([place]) 05/06/2018  . CVA (cerebral vascular accident) ([place]) 05/06/2018  . Essential hypertension 05/06/2018  . Hyperlipidemia 05/06/2018    Past Surgical History:  Procedure Laterality Date  . ABDOMINAL HYSTERECTOMY    . ABDOMINAL SURGERY     for small bowel obstruction - x 2  . APPENDECTOMY    . AV FISTULA PLACEMENT Left 08/04/2018   Procedure: ARTERIOVENOUS (AV) FISTULA CREATION LEFT ARM;  Surgeon: Waynetta Sandy, MD;  Location: [facility];  Service: Vascular;  Laterality: Left;  . CARDIAC CATHETERIZATION    .  CORONARY ANGIOPLASTY  ?2003/2004  . FACIAL RECONSTRUCTION SURGERY     x 2  . HERNIA REPAIR       OB History   No obstetric history on file.      Home Medications    Prior to Admission medications   Medication Sig Start Date End Date Taking? Authorizing Provider  acetaminophen (TYLENOL) 500 MG tablet Take 1,000 mg by mouth every 6 (six) hours as needed for moderate pain or headache.    [provider]  albuterol (PROVENTIL) (2.5 MG/3ML) 0.083% nebulizer solution Take 2.5 mg by nebulization every 6 (six) hours as needed for wheezing or shortness of breath.    [provider]  allopurinol (ZYLOPRIM) 100 MG tablet Take 100 mg by mouth daily. 02/18/18   [provider]  amLODipine (NORVASC) 10 MG tablet Take 10 mg by mouth at bedtime. 04/21/18   [provider]  Carboxymethylcellul-Glycerin (LUBRICATING EYE DROPS OP) Place 1 drop into both eyes daily as needed (dry eyes).    [provider]  Cholecalciferol (VITAMIN D3) 50 MCG (2000 UT) capsule Take 2,000 Units by mouth every morning.    [provider]  dicyclomine (BENTYL) 10 MG capsule Take 20 mg by mouth 2 (two) times a day.  08/27/17   [provider]  diphenhydrAMINE (BENADRYL) 25 MG tablet Take 25 mg by mouth daily as needed for allergies.    [provider]  docusate sodium (COLACE) 100 MG capsule Take 1 capsule (100 mg total) by mouth 2 (two) times daily. 08/11/18   Hongalgi, Lenis Dickinson, MD  doxylamine, Sleep, (UNISOM) 25 MG tablet Take 25 mg by mouth at bedtime as needed for sleep.    [provider]  Ipratropium-Albuterol (COMBIVENT) 20-100 MCG/ACT AERS respimat Inhale 2 puffs into the lungs 4 (four) times daily as needed for wheezing. 09/11/17   [provider]  labetalol (NORMODYNE) 200 MG tablet Take 200 mg by mouth 2 (two) times daily. 03/03/18   [provider]  nicotine (NICODERM CQ - DOSED IN MG/24 HOURS) 21 mg/24hr patch Place 21 mg onto  the skin daily as needed (smoking cessation).    [provider]  nitroGLYCERIN (NITROSTAT) 0.4 MG SL tablet Place 0.4 mg under the tongue every 5 (five) minutes as needed for chest pain.  09/18/16   [provider]  pantoprazole (PROTONIX) 40 MG tablet Take 1 tablet (40 mg total) by mouth daily. 08/11/18   Hongalgi, Lenis Dickinson, MD  polyethylene glycol (MIRALAX / GLYCOLAX) 17 g packet Take 17 g by mouth 2 (two) times daily. 08/11/18   Hongalgi, Lenis Dickinson, MD  pravastatin (PRAVACHOL) 80 MG tablet Take 80 mg by mouth daily.  06/30/16   [provider]  sennosides-docusate sodium (SENOKOT-S) 8.6-50 MG tablet Take 1 tablet by mouth daily.    [provider]  traMADol (ULTRAM) 50 MG tablet Take 1 tablet (  50 mg total) by mouth every 6 (six) hours as needed. Patient taking differently: Take 50 mg by mouth every 6 (six) hours as needed for moderate pain.  08/04/18 08/04/19  Dagoberto Ligas, PA-C    Family History No family history on file.  Social History Social History   Tobacco Use  . Smoking status: Former Research scientist (life sciences)  . Smokeless tobacco: Never Used  Substance Use Topics  . Alcohol use: Not Currently    Comment: quit about 6 months ago - "I was a drunk"  . Drug use: Yes    Types: Marijuana    Comment: h/o drug use - "I was raised in the 60s"; last use 18 months ago; smokes marijuana periodically     Allergies   Cyclobenzaprine, Morphine, Penicillins, Hydromorphone, and Codeine   Review of Systems Review of Systems  Constitutional: Negative for fever.  Respiratory: Negative for cough and shortness of breath.   Cardiovascular: Negative for chest pain.  Gastrointestinal: Positive for abdominal pain, anorexia, diarrhea, nausea and vomiting.  Genitourinary: Negative for dysuria and hematuria.  All other systems reviewed and are negative.    Physical Exam Updated Vital Signs BP (!) 184/79 (BP Location: Right Arm)   Pulse 70   Temp 98 F (36.7 C) (Oral)   Resp  18   Ht 5\' 1"  (1.549 m)   Wt 47.2 kg   SpO2 100%   BMI 19.65 kg/m   Physical Exam Vitals signs and nursing note reviewed.  Constitutional:      General: She is not in acute distress.    Appearance: She is well-developed.  HENT:     Head: Normocephalic and atraumatic.     Mouth/Throat:     Mouth: Mucous membranes are moist.  Eyes:     Conjunctiva/sclera: Conjunctivae normal.     Pupils: Pupils are equal, round, and reactive to light.  Neck:     Musculoskeletal: Normal range of motion and neck supple.  Cardiovascular:     Rate and Rhythm: Normal rate and regular rhythm.     Heart sounds: Murmur present. Systolic murmur present with a grade of 3/6.  Pulmonary:     Effort: Pulmonary effort is normal. No respiratory distress.     Breath sounds: Normal breath sounds. No wheezing or rales.     Comments: Dialysis catheter present in the right upper chest Abdominal:     General: Bowel sounds are decreased. There is distension.     Palpations: Abdomen is soft.     Tenderness: There is abdominal tenderness in the periumbilical area. There is no guarding or rebound.     Comments: Multiple well-healed surgical scars down the midline of the abdomen  Musculoskeletal: Normal range of motion.        General: No tenderness.     Right lower leg: No edema.     Left lower leg: No edema.  Skin:    General: Skin is warm and dry.     Capillary Refill: Capillary refill takes less than 2 seconds.     Findings: No erythema or rash.  Neurological:     General: No focal deficit present.     Mental Status: She is alert and oriented to person, place, and time. Mental status is at baseline.  Psychiatric:        Mood and Affect: Mood normal.        Behavior: Behavior normal.        Thought Content: Thought content normal.      ED  Treatments / Results  Labs (all labs ordered are listed, but only abnormal results are displayed) Labs Reviewed  LIPASE, BLOOD - Abnormal; Notable for the following  components:      Result Value   Lipase 86 (*)    All other components within normal limits  COMPREHENSIVE METABOLIC PANEL - Abnormal; Notable for the following components:   Sodium 132 (*)    Potassium 6.0 (*)    Chloride 96 (*)    CO2 20 (*)    BUN 69 (*)    Creatinine, Ser 6.49 (*)    Calcium 8.0 (*)    GFR calc non Af Amer 6 (*)    GFR calc Af Amer 7 (*)    Anion gap 16 (*)    All other components within normal limits  CBC - Abnormal; Notable for the following components:   RDW 17.2 (*)    All other components within normal limits  URINALYSIS, ROUTINE W REFLEX MICROSCOPIC - Abnormal; Notable for the following components:   APPearance CLOUDY (*)    Protein, ur 100 (*)    Leukocytes,Ua MODERATE (*)    Bacteria, UA MANY (*)    All other components within normal limits    EKG None  Radiology Ct Abdomen Pelvis Wo Contrast  Result Date: 09/15/2018 CLINICAL DATA:  Abdominal distension with nausea and vomiting EXAM: CT ABDOMEN AND PELVIS WITHOUT CONTRAST TECHNIQUE: Multidetector CT imaging of the abdomen and pelvis was performed following the standard protocol without oral or IV contrast. COMPARISON:  May 30, 2018 and August 09, 2018; January 30, 2010 FINDINGS: Lower chest: There are foci of scarring in the lung bases. There is cardiomegaly with foci of coronary artery calcification. Next field Hepatobiliary: No focal liver lesions are evident on this noncontrast enhanced study. The gallbladder wall is not appreciably thickened. There is no biliary duct dilatation. Pancreas: No pancreatic mass or inflammatory focus. Spleen: No splenic lesions evident. Adrenals/Urinary Tract: 2.1 x 1.7 cm on the right. There is a mass arising from the left adrenal measuring 2.3 x 2.2 cm. 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 The attenuation of the mass in the right is indeterminate. Note that both adrenal masses have remains stable since 2012. There is a cyst arising from the  upper pole left kidney medially measuring 1.0 x 1.0 cm. There is a cyst arising from the posterior right kidney measuring 1.1 x 0.8 cm. Subcentimeter cysts are noted elsewhere in the kidneys. There are foci of peripheral vascular calcification in each kidney. No hydronephrosis evident on either side. There are no appreciable renal or ureteral calculi on either side. Urinary bladder is midline. Urinary bladder wall is mildly thickened. Air is noted in the anterior aspect of the urinary bladder. Stomach/Bowel: There is mild wall thickening in the third portion duodenum region. No other appreciable bowel wall thickening. Patent anastomosis involving the mid jejunum in the left abdomen. No evident bowel obstruction currently. No free air or portal venous air. Terminal ileal region appears unremarkable. Vascular/Lymphatic: There is extensive aortic and iliac artery atherosclerosis. Aorta is ectatic. The maximum transverse diameter of the aorta is seen in its mid to distal aspect measuring 2.9 x 2.8 cm. No periaortic fluid noted. No adenopathy evident in the abdomen or pelvis. Reproductive: Uterus absent.  No pelvic mass. Other: Appendix absent. No periappendiceal region inflammatory change. No abscess or ascites evident in the abdomen or pelvis. There is a small ventral hernia containing only fat. No bowel  containing hernia evident. Musculoskeletal: No blastic or lytic bone lesions are evident. There are foci of degenerative change in each hip joint and lumbar spine regions. No intramuscular lesions evident. IMPRESSION: 1. Air is noted within the anterior urinary bladder. There is mild urinary bladder wall thickening. Suspect cystitis with potential gas-forming organism. Correlation with urinalysis advised in this regard. Note that there is no hydronephrosis or perinephric fluid/stranding on either side. 2. Wall thickening involving the third portion of the duodenum, likely due to a degree of enteritis. No bowel  obstruction evident. No abscess in the abdomen or pelvis. Appendix absent. No periappendiceal region inflammatory change. 3. Extensive aortic and iliac artery atherosclerosis. Maximum diameter of the aorta is 2.9 x 2.8 cm. Ectatic abdominal aorta at risk for aneurysm development. Recommend followup by ultrasound in 5 years. This recommendation follows ACR consensus guidelines: White Paper of the ACR Incidental Findings Committee II on Vascular Findings. J Am Coll Radiol 2013; 10:789-794. Foci of peripheral renal artery calcification noted bilaterally. There are foci of coronary artery calcification. 4.  Uterus absent. 5.  Small ventral hernia containing only fat. Electronically Signed   By: Lowella Grip III M.D.   On: 09/15/2018 09:26    Procedures Procedures (including critical care time)  Medications Ordered in ED Medications  HYDROmorphone (DILAUDID) injection 1 mg (has no administration in time range)  ondansetron (ZOFRAN) injection 4 mg (has no administration in time range)     Initial Impression / Assessment and Plan / ED Course  I have reviewed the triage vital signs and the nursing notes.  Pertinent labs & imaging results that were available during my care of the patient were reviewed by me and considered in my medical decision making (see chart for details).        Elderly female with multiple medical problems presenting today with complaints of abdominal pain, vomiting and some diarrhea over the last 2 days.  Patient was at [facility] emergency room yesterday but states the abdominal pain stopped while she was waiting.  She denies having CAT scan yesterday but states she was told she had an intestinal infection.  She was sent home with nausea medication.  She states this morning she has not had anything to eat but when she got to dialysis the pain started again and it was severe.  She is currently having 5 out of 10 pain.  It feels like prior times when she had a small bowel  obstruction.  She has had surgery twice for lysis of adhesions most recently approximately a year ago.  She denies fever, shortness of breath and has no evidence of fluid overload at this time.  She does have tenderness around the umbilicus and decreased bowel sounds with some mild distention.  She does still make urine but denies any urinary symptoms.  Labs to ensure normal potassium and hemoglobin levels are pending.  CT to further evaluate also pending.  10:23 AM Patient's lipase is mildly elevated at 86, CMP with elevation of potassium to 6 and elevated BUN but patient is due for dialysis.  CBC within normal limits.  CT shows air noted within the anterior urinary bladder with some wall thickening which they suspect cystitis and advised correlating with a UA.  Patient is not complaining of urinary symptoms at this time.  She also has wall thickening in the third portion of her duodenum likely due to a degree of enteritis but no signs of bowel obstruction.  This is the area approximately where she  is having discomfort.  Otherwise she has extensive aortic disease and they recommend follow-up ultrasound in 5 years to ensure she is not developing a AAA.  On reevaluation patient feels better.  We will follow-up on urine but if that is normal feel that patient would be a good candidate for discharge and follow-up or return if symptoms worsen.  Will attempt to see if patient can be dialyzed at her center today.  10:52 AM Patient's urine showed moderate leukocytes and 21-50 white blood cells but it was also contaminated with 11-20 squamous cells.  This does not seem classic for infection and patient denies any dysuria, frequency or urgency.  We will send the urine for culture but otherwise feel that patient can be discharged and return if symptoms worsen.  Pt was sent directly to dialysis upon discharge and her sister will pick her up.  Final Clinical Impressions(s) / ED Diagnoses   Final diagnoses:  Enteritis   Periumbilical abdominal pain    ED Discharge Orders         Ordered    HYDROcodone-acetaminophen (NORCO/VICODIN) 5-325 MG tablet  Every 6 hours PRN     09/15/18 1111           Blanchie Dessert, MD 09/15/18 1113

## 2018-09-15 NOTE — Discharge Instructions (Signed)
Make sure you are eating a very bland diet and getting plenty of fluids.  If you start running a fever the pain becomes severe and you cannot hold anything down please return.  You can go directly to your dialysis center today and they know that you are coming.

## 2018-09-15 NOTE — TOC Transition Note (Signed)
Transition of Care [facility]) - CM/SW Discharge Note   Patient Details  Name: Christina [name] MRN: [id number] Date of Birth: 1950-06-24  Transition of Care [facility]) CM/SW Contact:  Fuller Mandril, RN Phone Number: 09/15/2018, 11:57 AM   Clinical Narrative:    [facility] contacted [facility] [phone]) to aid in getting the pt dialysis treatment upon discharge from ED today.    Final next level of care: Home/Self Care Barriers to Discharge: Barriers Resolved   Patient Goals and CMS Choice Patient states their goals for this hospitalization and ongoing recovery are:: find out what is wrong with my stomach      Discharge Placement                       Discharge Plan and Services   Discharge Planning Services: CM Consult, Follow-up appt scheduled Post Acute Care Choice: Dialysis                 Merleen Nicely, at [facility] confirmed that pt would be worked in for dialysis treatment today.  EDCM provided transportation to [facility].  No Further EDCM needs identified.                Social Determinants of Health (SDOH) Interventions     Readmission Risk Interventions No flowsheet data found.

## 2018-09-15 NOTE — TOC Initial Note (Signed)
Transition of Care [facility]) - Initial/Assessment Note    Patient Details  Name: Christina [name] MRN: [id number] Date of Birth: [birth year]  Transition of Care [facility]) CM/SW Contact:    Fuller Mandril, RN Phone Number: 09/15/2018, 11:55 AM  Clinical Narrative:                 [facility] consulted regarding a "fit-in" outpatient dialysis for pt.  Expected Discharge Plan: Home/Self Care Barriers to Discharge: Waiting for outpatient dialysis   Patient Goals and CMS Choice Patient states their goals for this hospitalization and ongoing recovery are:: find out what is wrong with my stomach      Expected Discharge Plan and Services Expected Discharge Plan: Home/Self Care   Discharge Planning Services: CM Consult, Follow-up appt scheduled Post Acute Care Choice: Dialysis Living arrangements for the past 2 months: Single Family Home Expected Discharge Date: 09/15/18                                    Prior Living Arrangements/Services Living arrangements for the past 2 months: Single Family Home Lives with:: Self Patient language and need for interpreter reviewed:: Yes        Need for Family Participation in Patient Care: Yes (Comment) Care giver support system in place?: Yes (comment)   Criminal Activity/Legal Involvement Pertinent to Current Situation/Hospitalization: No - Comment as needed  Activities of Daily Living      Permission Sought/Granted   Permission granted to share information with : Yes, Verbal Permission Granted     Permission granted to share info w AGENCY: [facility]        Emotional Assessment Appearance:: Appears stated age Attitude/Demeanor/Rapport: Engaged Affect (typically observed): Appropriate Orientation: : Oriented to Self, Oriented to Place, Oriented to  Time, Oriented to Situation   Psych Involvement: No (comment)  Admission diagnosis:  Dialysis pt abd pain Patient Active Problem List   Diagnosis Date Noted  . SBO (small bowel  obstruction) ([place]) 08/10/2018  . Anemia due to chronic blood loss   . Heme positive stool   . Platelet inhibition due to Plavix   . Acute GI bleeding 05/06/2018  . ESRD (end stage renal disease) ([place]) 05/06/2018  . Anxiety 05/06/2018  . Bipolar affective ([place]) 05/06/2018  . CAD (coronary artery disease), native coronary artery 05/06/2018  . COPD with chronic bronchitis ([place]) 05/06/2018  . CVA (cerebral vascular accident) ([place]) 05/06/2018  . Essential hypertension 05/06/2018  . Hyperlipidemia 05/06/2018   PCP:  Maggie Schwalbe, PA-C Pharmacy:   [facility], [place] [address] Phone: [phone] Fax: [phone]     Social Determinants of Health (SDOH) Interventions    Readmission Risk Interventions No flowsheet data found.

## 2018-09-15 NOTE — ED Triage Notes (Signed)
Per [name] pt coming from dialysis center, was not able to complete any of treatment due to abdominal pain. Patient reports history of abdominal pain and emesis. Patient usually has dialysis T/TH/Sat but was at [facility] yesterday and said "I had bacteria in my intestines."

## 2018-09-17 ENCOUNTER — Ambulatory Visit (INDEPENDENT_AMBULATORY_CARE_PROVIDER_SITE_OTHER): Payer: Self-pay | Admitting: Physician Assistant

## 2018-09-17 ENCOUNTER — Encounter: Payer: Self-pay | Admitting: *Deleted

## 2018-09-17 ENCOUNTER — Other Ambulatory Visit: Payer: Self-pay

## 2018-09-17 ENCOUNTER — Encounter (HOSPITAL_COMMUNITY): Payer: Medicare Other

## 2018-09-17 ENCOUNTER — Other Ambulatory Visit: Payer: Self-pay | Admitting: *Deleted

## 2018-09-17 ENCOUNTER — Ambulatory Visit (HOSPITAL_COMMUNITY)
Admission: RE | Admit: 2018-09-17 | Discharge: 2018-09-17 | Disposition: A | Discharge status: Home / Self Care | Payer: Medicare Other | Source: Ambulatory Visit | Attending: Vascular Surgery | Admitting: Vascular Surgery

## 2018-09-17 VITALS — BP 185/82 | HR 74 | Temp 97.9°F | Resp 14 | Ht 61.0 in | Wt 96.4 lb

## 2018-09-17 DIAGNOSIS — N186 End stage renal disease: Secondary | ICD-10-CM

## 2018-09-17 LAB — URINE CULTURE: Culture: >=100000 — AB

## 2018-09-17 NOTE — Progress Notes (Signed)
POST OPERATIVE OFFICE NOTE    CC:  F/u for surgery  HPI:  This is a 68 y.o. female who is s/p first stage basilic av fistula.  She denise pain, loss of sensation and motor.  She is currently on HD via [facility].    Allergies  Allergen Reactions  . Cyclobenzaprine Anaphylaxis and Other (See Comments)    "stopped heart"   . Morphine Anaphylaxis    "stopped heart"  . Penicillins Shortness Of Breath, Swelling and Palpitations    Did it involve swelling of the face/tongue/throat, SOB, or low BP? Yes Did it involve sudden or severe rash/hives, skin peeling, or any reaction on the inside of your mouth or nose? No Did you need to seek medical attention at a hospital or doctor's office? No When did it last happen?years  If all above answers are "NO", may proceed with cephalosporin use.   Marland Kitchen Hydromorphone Other (See Comments)    If administered quickly, felt like hand was "exploding"   . Codeine Itching and Rash    Current Outpatient Medications  Medication Sig Dispense Refill  . acetaminophen (TYLENOL) 500 MG tablet Take 1,000 mg by mouth every 6 (six) hours as needed for moderate pain or headache.    . albuterol (PROVENTIL) (2.5 MG/3ML) 0.083% nebulizer solution Take 2.5 mg by nebulization every 6 (six) hours as needed for wheezing or shortness of breath.    . allopurinol (ZYLOPRIM) 100 MG tablet Take 100 mg by mouth daily.    Marland Kitchen amLODipine (NORVASC) 10 MG tablet Take 10 mg by mouth at bedtime.    . Carboxymethylcellul-Glycerin (LUBRICATING EYE DROPS OP) Place 1 drop into both eyes daily as needed (dry eyes).    . Cholecalciferol (VITAMIN D3) 50 MCG (2000 UT) capsule Take 2,000 Units by mouth every morning.    . dicyclomine (BENTYL) 10 MG capsule Take 20 mg by mouth 2 (two) times a day.     . diphenhydrAMINE (BENADRYL) 25 MG tablet Take 25 mg by mouth daily as needed for allergies.    Marland Kitchen docusate sodium (COLACE) 100 MG capsule Take 1 capsule (100 mg total) by mouth 2 (two) times daily. 30  capsule 0  . doxylamine, Sleep, (UNISOM) 25 MG tablet Take 25 mg by mouth at bedtime as needed for sleep.    . Ipratropium-Albuterol (COMBIVENT) 20-100 MCG/ACT AERS respimat Inhale 2 puffs into the lungs 4 (four) times daily as needed for wheezing.    Marland Kitchen labetalol (NORMODYNE) 200 MG tablet Take 200 mg by mouth 2 (two) times daily.    . nicotine (NICODERM CQ - DOSED IN MG/24 HOURS) 21 mg/24hr patch Place 21 mg onto the skin daily as needed (smoking cessation).    . nitroGLYCERIN (NITROSTAT) 0.4 MG SL tablet Place 0.4 mg under the tongue every 5 (five) minutes as needed for chest pain.     . pantoprazole (PROTONIX) 40 MG tablet Take 1 tablet (40 mg total) by mouth daily.    . polyethylene glycol (MIRALAX / GLYCOLAX) 17 g packet Take 17 g by mouth 2 (two) times daily.    . pravastatin (PRAVACHOL) 80 MG tablet Take 80 mg by mouth daily.     . sennosides-docusate sodium (SENOKOT-S) 8.6-50 MG tablet Take 1 tablet by mouth daily.    . traMADol (ULTRAM) 50 MG tablet Take 1 tablet (50 mg total) by mouth every 6 (six) hours as needed. (Patient taking differently: Take 50 mg by mouth every 6 (six) hours as needed for moderate pain. ) 10  tablet 0   No current facility-administered medications for this visit.      ROS:  See HPI  Physical Exam:  +------------+----------+-------------+----------+--------+ OUTFLOW VEINPSV (cm/s)Diameter (cm)Depth (cm)Describe +------------+----------+-------------+----------+--------+ Prox UA        103        0.75        0.71            +------------+----------+-------------+----------+--------+ Mid UA          85        0.79        0.47            +------------+----------+-------------+----------+--------+ Dist UA        237        0.81        0.26            +------------+----------+-------------+----------+--------+ AC Fossa       329        0.68        0.39            +------------+----------+-------------+----------+--------+    Incision:  Well healed Extremities:  Grip 5/5 and sensation intact and equal B UE Lungs non labored breathing Heart RRR  Assessment/Plan:  This is a 68 y.o. female who is s/p:Left arm for stage basilic vein AV fistula creation 08/24/2018 by Dr. Donzetta Matters   The vein is maturing well and she denise symptoms of steal.  She will be scheduled for second stage basilic transpositon Her HD days are TTS. She is not on any anticoagulation.   Roxy Horseman PA-C [facility] [phone]  Clinic MD:  Donzetta Matters

## 2018-09-18 ENCOUNTER — Telehealth: Payer: Self-pay | Admitting: Emergency Medicine

## 2018-09-18 NOTE — Telephone Encounter (Signed)
Post ED Visit - Positive Culture Follow-up: Successful Patient Follow-Up  Culture assessed and recommendations reviewed by:  []  Elenor Quinones, Pharm.D. []  Heide Guile, Pharm.D., BCPS AQ-ID []  Parks Neptune, Pharm.D., BCPS []  Alycia Rossetti, Pharm.D., BCPS []  [place], Pharm.D., BCPS, AAHIVP []  Legrand Como, Pharm.D., BCPS, AAHIVP [x]  Salome Arnt, PharmD, BCPS []  Johnnette Gourd, PharmD, BCPS []  Hughes Better, PharmD, BCPS []  Leeroy Cha, PharmD  Positive urine culture  [x]  Patient discharged without antimicrobial prescription and treatment is now indicated []  Organism is resistant to prescribed ED discharge antimicrobial []  Patient with positive blood cultures  Changes discussed with ED provider: Carmon Sails, PA New antibiotic prescription:  Keflex 500 mg PO once daily x five days, take after HD on HD days. Called to [place]  Contacted patient, date 09/18/2018, time [place] 09/18/2018, 5:23 PM

## 2018-09-18 NOTE — Progress Notes (Addendum)
ED Antimicrobial Stewardship Positive Culture Follow Up   Christina [name] is an 68 y.o. female who presented to [facility] on 09/15/2018 with a chief complaint of  Chief Complaint  Patient presents with  . Abdominal Pain    Recent Results (from the past 720 hour(s))  Urine Culture     Status: Abnormal   Collection Time: 09/15/18  7:52 AM   Specimen: Urine, Clean Catch  Result Value Ref Range Status   Specimen Description URINE, CLEAN CATCH  Final   Special Requests NONE  Final   Culture (A)  Final    >=100,000 COLONIES/mL ESCHERICHIA COLI SUSCEPTIBILITIES TO FOLLOW Performed at [facility], [address], [place], [place] [address]    Report Status 09/17/2018 FINAL  Final   Organism ID, Bacteria ESCHERICHIA COLI (A)  Final      Susceptibility   Escherichia coli - MIC*    AMPICILLIN >=32 RESISTANT Resistant     CEFAZOLIN <=4 SENSITIVE Sensitive     CEFTRIAXONE <=1 SENSITIVE Sensitive     CIPROFLOXACIN <=0.25 SENSITIVE Sensitive     GENTAMICIN <=1 SENSITIVE Sensitive     IMIPENEM <=0.25 SENSITIVE Sensitive     NITROFURANTOIN <=16 SENSITIVE Sensitive     TRIMETH/SULFA <=20 SENSITIVE Sensitive     AMPICILLIN/SULBACTAM 16 INTERMEDIATE Intermediate     PIP/TAZO <=4 SENSITIVE Sensitive     Extended ESBL NEGATIVE Sensitive     * >=100,000 COLONIES/mL ESCHERICHIA COLI    []  Treated with N/A, organism resistant to prescribed antimicrobial [x]  Patient discharged originally without antimicrobial agent and treatment is now indicated  New antibiotic prescription: If pt with UTI symptoms, start cephalexin 500mg  PO daily x 5 days. Please take dose after HD on HD days  ED Provider: Carmon Sails, PA  Tyger Oka, Rande Lawman 09/18/2018, 9:46 AM Clinical Pharmacist Monday - Friday phone -  [phone] Saturday - Sunday phone - [phone]

## 2018-10-11 ENCOUNTER — Other Ambulatory Visit (HOSPITAL_COMMUNITY)
Admission: RE | Admit: 2018-10-11 | Discharge: 2018-10-11 | Disposition: A | Discharge status: Home / Self Care | Payer: Medicare Other | Source: Ambulatory Visit | Attending: Vascular Surgery | Admitting: Vascular Surgery

## 2018-10-11 DIAGNOSIS — Z01812 Encounter for preprocedural laboratory examination: Secondary | ICD-10-CM | POA: Diagnosis present

## 2018-10-11 DIAGNOSIS — Z20828 Contact with and (suspected) exposure to other viral communicable diseases: Secondary | ICD-10-CM | POA: Diagnosis not present

## 2018-10-11 LAB — SARS CORONAVIRUS 2 (TAT 6-24 HRS): SARS Coronavirus 2: NEGATIVE

## 2018-10-12 ENCOUNTER — Other Ambulatory Visit: Payer: Self-pay

## 2018-10-12 ENCOUNTER — Other Ambulatory Visit (HOSPITAL_COMMUNITY): Payer: Medicare Other

## 2018-10-12 ENCOUNTER — Encounter (HOSPITAL_COMMUNITY): Payer: Self-pay | Admitting: *Deleted

## 2018-10-12 NOTE — Progress Notes (Signed)
Spoke with pt for pre-op call. Pt has hx of CAD with stents and A-fib. Pt's cardiologist is Dr. Abran Richard, last office visit was 09/06/18. Pt was on Aspirin, but she states her doctor (cardiologist) told her not to take it for a while "to see how I do without it" She also stated that "the man that saw me I Dr. Claretha Cooper office told me to stop it too". Pt has not had any for a couple of weeks.   Pt had Covid test done on 10/11/18 and it's negative. She went to dialysis today but wore her mask, she states she is in quarantine other than going to dialysis.  Informed pt she could 1 visitor that can wait in the waiting room while pt is in pre-op, surgery and PACU. She voiced understanding.

## 2018-10-13 ENCOUNTER — Ambulatory Visit (HOSPITAL_COMMUNITY): Admission: RE | Admit: 2018-10-13 | Payer: Medicare Other | Source: Home / Self Care | Admitting: Vascular Surgery

## 2018-10-13 SURGERY — TRANSPOSITION, VEIN, BASILIC
Anesthesia: Choice | Laterality: Left

## 2018-10-21 ENCOUNTER — Encounter: Payer: Self-pay | Admitting: *Deleted

## 2018-10-21 ENCOUNTER — Other Ambulatory Visit: Payer: Self-pay | Admitting: *Deleted

## 2018-10-21 NOTE — Progress Notes (Signed)
Patient called to re-schedule surgery with Dr. Donzetta Matters for "second stage BVT". Instructed to be at [facility] admitting at 7:45 am or as directed by hospital  on 11/10/2018. NPO past MN night prior and must have a driver and caregiver for discharge. Expect a call and follow the detailed surgery instructions and information for pre-op nasal swab as directed by the hospital preadmission department. Verbalized understanding. To call this office if questions.

## 2018-10-21 NOTE — Progress Notes (Signed)
Pre-op instruction letter faxed and received by JADA at [facility]. They will review with patient.

## 2018-10-26 ENCOUNTER — Telehealth: Payer: Self-pay | Admitting: *Deleted

## 2018-10-26 NOTE — Telephone Encounter (Signed)
Spoke with As[place] at [facility] to speak with patient about surgery scheduled for 11/10/2018 and review pre-op letter with patient.

## 2018-11-05 ENCOUNTER — Other Ambulatory Visit: Payer: Self-pay | Admitting: *Deleted

## 2018-11-08 NOTE — Progress Notes (Signed)
Spoke with pt for pre-op call but pt states she is not having surgery on Wednesday and that she has left messages at Dr. Claretha Cooper office and "the surgery center". I called Dr. Claretha Cooper office and spoke with Zigmund Daniel, RN and she states the kidney center had just let them know that pt is needing to reschedule.

## 2018-11-22 ENCOUNTER — Other Ambulatory Visit (HOSPITAL_COMMUNITY)
Admission: RE | Admit: 2018-11-22 | Discharge: 2018-11-22 | Disposition: A | Discharge status: Home / Self Care | Payer: Medicare Other | Source: Ambulatory Visit | Attending: Vascular Surgery | Admitting: Vascular Surgery

## 2018-11-22 DIAGNOSIS — Z01812 Encounter for preprocedural laboratory examination: Secondary | ICD-10-CM | POA: Insufficient documentation

## 2018-11-22 DIAGNOSIS — Z20828 Contact with and (suspected) exposure to other viral communicable diseases: Secondary | ICD-10-CM | POA: Insufficient documentation

## 2018-11-23 ENCOUNTER — Other Ambulatory Visit: Payer: Self-pay

## 2018-11-23 LAB — NOVEL CORONAVIRUS, NAA (HOSP ORDER, SEND-OUT TO REF LAB; TAT 18-24 HRS): SARS-CoV-2, NAA: NOT DETECTED

## 2018-11-23 NOTE — Anesthesia Preprocedure Evaluation (Addendum)
Anesthesia Evaluation  Patient identified by MRN, date of birth, ID band Patient awake    Reviewed: Allergy & Precautions, NPO status , Patient's Chart, lab work & pertinent test results, reviewed documented beta blocker date and time   Airway Mallampati: I  TM Distance: >3 FB Neck ROM: Full    Dental  (+) Poor Dentition, Chipped, Missing, Edentulous Upper, Dental Advisory Given,    Pulmonary asthma , COPD,  COPD inhaler, former smoker,  Last needed rescue inhaler over a month ago    + decreased breath sounds      Cardiovascular hypertension, Pt. on medications and Pt. on home beta blockers + CAD, + Past MI and + Cardiac Stents  + dysrhythmias Atrial Fibrillation  Rhythm:Regular Rate:Normal  1 stents 2003/2004   Neuro/Psych  Headaches, PSYCHIATRIC DISORDERS Anxiety Depression Bipolar Disorder CVA    GI/Hepatic Neg liver ROS, GERD  Medicated and Controlled,  Endo/Other  negative endocrine ROS  Renal/GU ESRF and DialysisRenal diseaseHD T/Th/Sat since 03/2018  negative genitourinary   Musculoskeletal  (+) Arthritis ,   Abdominal Normal abdominal exam  (+)   Peds  Hematology  (+) anemia ,   Anesthesia Other Findings   Reproductive/Obstetrics negative OB ROS                           Anesthesia Physical  Anesthesia Plan  ASA: III  Anesthesia Plan: MAC and Regional   Post-op Pain Management:    Induction: Intravenous  PONV Risk Score and Plan: 2 and Treatment may vary due to age or medical condition, Propofol infusion and TIVA  Airway Management Planned: Simple Face Mask  Additional Equipment: None  Intra-op Plan:   Post-operative Plan:   Informed Consent: I have reviewed the patients History and Physical, chart, labs and discussed the procedure including the risks, benefits and alternatives for the proposed anesthesia with the patient or authorized representative who has indicated  his/her understanding and acceptance.       Plan Discussed with: CRNA  Anesthesia Plan Comments: (S)        Anesthesia Quick Evaluation

## 2018-11-23 NOTE — Progress Notes (Signed)
Spoke with pt for pre-op call. Pt denies any recent chest pain or sob. Pt states she is not diabetic.   Pt had her Covid test done 11/22/18, result pending. States she has been in quarantine since the test except for going to dialysis and states she wore her mask.   Pt has been off her Aspirin since September and hasn't restarted it, she states no one has instructed her to do so.

## 2018-11-24 ENCOUNTER — Ambulatory Visit (HOSPITAL_COMMUNITY): Payer: Medicare Other | Admitting: Anesthesiology

## 2018-11-24 ENCOUNTER — Encounter (HOSPITAL_COMMUNITY)
Admission: RE | Disposition: A | Discharge status: Home / Self Care | Payer: Self-pay | Source: Home / Self Care | Attending: Vascular Surgery

## 2018-11-24 ENCOUNTER — Ambulatory Visit (HOSPITAL_COMMUNITY)
Admission: RE | Admit: 2018-11-24 | Discharge: 2018-11-24 | Disposition: A | Discharge status: Home / Self Care | Payer: Medicare Other | Attending: Vascular Surgery | Admitting: Vascular Surgery

## 2018-11-24 ENCOUNTER — Encounter (HOSPITAL_COMMUNITY): Payer: Self-pay

## 2018-11-24 ENCOUNTER — Other Ambulatory Visit: Payer: Self-pay

## 2018-11-24 DIAGNOSIS — I2510 Atherosclerotic heart disease of native coronary artery without angina pectoris: Secondary | ICD-10-CM | POA: Insufficient documentation

## 2018-11-24 DIAGNOSIS — Z8673 Personal history of transient ischemic attack (TIA), and cerebral infarction without residual deficits: Secondary | ICD-10-CM | POA: Insufficient documentation

## 2018-11-24 DIAGNOSIS — Z992 Dependence on renal dialysis: Secondary | ICD-10-CM | POA: Diagnosis not present

## 2018-11-24 DIAGNOSIS — I4891 Unspecified atrial fibrillation: Secondary | ICD-10-CM | POA: Insufficient documentation

## 2018-11-24 DIAGNOSIS — Z7951 Long term (current) use of inhaled steroids: Secondary | ICD-10-CM | POA: Diagnosis not present

## 2018-11-24 DIAGNOSIS — Z87442 Personal history of urinary calculi: Secondary | ICD-10-CM | POA: Insufficient documentation

## 2018-11-24 DIAGNOSIS — N186 End stage renal disease: Secondary | ICD-10-CM | POA: Diagnosis not present

## 2018-11-24 DIAGNOSIS — J45909 Unspecified asthma, uncomplicated: Secondary | ICD-10-CM | POA: Diagnosis not present

## 2018-11-24 DIAGNOSIS — F319 Bipolar disorder, unspecified: Secondary | ICD-10-CM | POA: Diagnosis not present

## 2018-11-24 DIAGNOSIS — Z885 Allergy status to narcotic agent status: Secondary | ICD-10-CM | POA: Insufficient documentation

## 2018-11-24 DIAGNOSIS — Z87891 Personal history of nicotine dependence: Secondary | ICD-10-CM | POA: Insufficient documentation

## 2018-11-24 DIAGNOSIS — Z79899 Other long term (current) drug therapy: Secondary | ICD-10-CM | POA: Diagnosis not present

## 2018-11-24 DIAGNOSIS — Z955 Presence of coronary angioplasty implant and graft: Secondary | ICD-10-CM | POA: Diagnosis not present

## 2018-11-24 DIAGNOSIS — E785 Hyperlipidemia, unspecified: Secondary | ICD-10-CM | POA: Insufficient documentation

## 2018-11-24 DIAGNOSIS — I120 Hypertensive chronic kidney disease with stage 5 chronic kidney disease or end stage renal disease: Secondary | ICD-10-CM | POA: Insufficient documentation

## 2018-11-24 DIAGNOSIS — M109 Gout, unspecified: Secondary | ICD-10-CM | POA: Diagnosis not present

## 2018-11-24 DIAGNOSIS — Z90710 Acquired absence of both cervix and uterus: Secondary | ICD-10-CM | POA: Insufficient documentation

## 2018-11-24 DIAGNOSIS — F419 Anxiety disorder, unspecified: Secondary | ICD-10-CM | POA: Insufficient documentation

## 2018-11-24 DIAGNOSIS — Z888 Allergy status to other drugs, medicaments and biological substances status: Secondary | ICD-10-CM | POA: Insufficient documentation

## 2018-11-24 DIAGNOSIS — Z880 Allergy status to penicillin: Secondary | ICD-10-CM | POA: Diagnosis not present

## 2018-11-24 DIAGNOSIS — J449 Chronic obstructive pulmonary disease, unspecified: Secondary | ICD-10-CM | POA: Diagnosis not present

## 2018-11-24 DIAGNOSIS — N185 Chronic kidney disease, stage 5: Secondary | ICD-10-CM

## 2018-11-24 HISTORY — PX: BASCILIC VEIN TRANSPOSITION: SHX5742

## 2018-11-24 LAB — POCT I-STAT, CHEM 8
BUN: 39 mg/dL — ABNORMAL HIGH (ref 8–23)
Calcium, Ion: 1.19 mmol/L (ref 1.15–1.40)
Chloride: 103 mmol/L (ref 98–111)
Creatinine, Ser: 4 mg/dL — ABNORMAL HIGH (ref 0.44–1.00)
Glucose, Bld: 108 mg/dL — ABNORMAL HIGH (ref 70–99)
HCT: 39 % (ref 36.0–46.0)
Hemoglobin: 13.3 g/dL (ref 12.0–15.0)
Potassium: 4.9 mmol/L (ref 3.5–5.1)
Sodium: 139 mmol/L (ref 135–145)
TCO2: 27 mmol/L (ref 22–32)

## 2018-11-24 SURGERY — TRANSPOSITION, VEIN, BASILIC
Anesthesia: Monitor Anesthesia Care | Laterality: Left

## 2018-11-24 MED ORDER — MIDAZOLAM HCL 2 MG/2ML IJ SOLN
INTRAMUSCULAR | Status: AC
  Filled 2018-11-24: qty 2

## 2018-11-24 MED ORDER — MIDAZOLAM HCL 2 MG/2ML IJ SOLN
INTRAMUSCULAR | Status: AC
  Administered 2018-11-24: 1 mg via INTRAVENOUS
  Filled 2018-11-24: qty 2

## 2018-11-24 MED ORDER — PAPAVERINE HCL 30 MG/ML IJ SOLN
INTRAMUSCULAR | Status: AC
  Filled 2018-11-24: qty 2

## 2018-11-24 MED ORDER — 0.9 % SODIUM CHLORIDE (POUR BTL) OPTIME
TOPICAL | Status: DC | PRN
  Administered 2018-11-24: 09:00:00 1000 mL

## 2018-11-24 MED ORDER — ACETAMINOPHEN 325 MG PO TABS
650.0000 mg | ORAL_TABLET | Freq: Four times a day (QID) | ORAL | Status: DC | PRN
  Administered 2018-11-24: 650 mg via ORAL
  Filled 2018-11-24: qty 2

## 2018-11-24 MED ORDER — VANCOMYCIN HCL IN DEXTROSE 1-5 GM/200ML-% IV SOLN
1000.0000 mg | INTRAVENOUS | Status: AC
  Administered 2018-11-24: 1000 mg via INTRAVENOUS
  Filled 2018-11-24: qty 200

## 2018-11-24 MED ORDER — OXYCODONE-ACETAMINOPHEN 5-325 MG PO TABS: 1.0000 | ORAL_TABLET | ORAL | 0 refills | Status: DC | PRN

## 2018-11-24 MED ORDER — SODIUM CHLORIDE 0.9 % IV SOLN
INTRAVENOUS | Status: AC
  Filled 2018-11-24: qty 1.2

## 2018-11-24 MED ORDER — FENTANYL CITRATE (PF) 100 MCG/2ML IJ SOLN
50.0000 ug | Freq: Once | INTRAMUSCULAR | Status: AC
  Administered 2018-11-24: 08:00:00 50 ug via INTRAVENOUS

## 2018-11-24 MED ORDER — FENTANYL CITRATE (PF) 250 MCG/5ML IJ SOLN
INTRAMUSCULAR | Status: AC
  Filled 2018-11-24: qty 5

## 2018-11-24 MED ORDER — CHLORHEXIDINE GLUCONATE 4 % EX LIQD: 60.0000 mL | Freq: Once | CUTANEOUS | Status: DC

## 2018-11-24 MED ORDER — LIDOCAINE HCL (PF) 1 % IJ SOLN
INTRAMUSCULAR | Status: DC | PRN
  Administered 2018-11-24: 5 mL

## 2018-11-24 MED ORDER — FENTANYL CITRATE (PF) 100 MCG/2ML IJ SOLN
INTRAMUSCULAR | Status: AC
  Administered 2018-11-24: 50 ug via INTRAVENOUS
  Filled 2018-11-24: qty 2

## 2018-11-24 MED ORDER — FENTANYL CITRATE (PF) 100 MCG/2ML IJ SOLN
25.0000 ug | INTRAMUSCULAR | Status: DC | PRN
  Administered 2018-11-24: 25 ug via INTRAVENOUS

## 2018-11-24 MED ORDER — SODIUM CHLORIDE 0.9 % IV SOLN
INTRAVENOUS | Status: DC
  Administered 2018-11-24: 07:00:00 via INTRAVENOUS

## 2018-11-24 MED ORDER — PROPOFOL 500 MG/50ML IV EMUL
INTRAVENOUS | Status: DC | PRN
  Administered 2018-11-24: 100 ug/kg/min via INTRAVENOUS

## 2018-11-24 MED ORDER — LIDOCAINE 2% (20 MG/ML) 5 ML SYRINGE
INTRAMUSCULAR | Status: DC | PRN
  Administered 2018-11-24: 60 mg via INTRAVENOUS

## 2018-11-24 MED ORDER — FENTANYL CITRATE (PF) 100 MCG/2ML IJ SOLN
INTRAMUSCULAR | Status: AC
  Filled 2018-11-24: qty 2

## 2018-11-24 MED ORDER — LIDOCAINE-EPINEPHRINE (PF) 1 %-1:200000 IJ SOLN: INTRAMUSCULAR | Status: DC | PRN

## 2018-11-24 MED ORDER — OXYCODONE-ACETAMINOPHEN 5-325 MG PO TABS
1.0000 | ORAL_TABLET | Freq: Once | ORAL | Status: AC
  Administered 2018-11-24: 11:00:00 1 via ORAL

## 2018-11-24 MED ORDER — LIDOCAINE 2% (20 MG/ML) 5 ML SYRINGE
INTRAMUSCULAR | Status: AC
  Filled 2018-11-24: qty 5

## 2018-11-24 MED ORDER — PROPOFOL 10 MG/ML IV BOLUS
INTRAVENOUS | Status: AC
  Filled 2018-11-24: qty 40

## 2018-11-24 MED ORDER — ONDANSETRON HCL 4 MG/2ML IJ SOLN
INTRAMUSCULAR | Status: AC
  Filled 2018-11-24: qty 2

## 2018-11-24 MED ORDER — SODIUM CHLORIDE 0.9 % IV SOLN
INTRAVENOUS | Status: DC | PRN
  Administered 2018-11-24: 500 mL

## 2018-11-24 MED ORDER — OXYCODONE-ACETAMINOPHEN 5-325 MG PO TABS
ORAL_TABLET | ORAL | Status: AC
  Administered 2018-11-24: 1 via ORAL
  Filled 2018-11-24: qty 1

## 2018-11-24 MED ORDER — MIDAZOLAM HCL 2 MG/2ML IJ SOLN
1.0000 mg | Freq: Once | INTRAMUSCULAR | Status: AC
  Administered 2018-11-24: 08:00:00 1 mg via INTRAVENOUS

## 2018-11-24 MED ORDER — ROPIVACAINE HCL 5 MG/ML IJ SOLN
INTRAMUSCULAR | Status: DC | PRN
  Administered 2018-11-24: 15 mL via PERINEURAL

## 2018-11-24 MED ORDER — ONDANSETRON HCL 4 MG/2ML IJ SOLN
INTRAMUSCULAR | Status: DC | PRN
  Administered 2018-11-24: 4 mg via INTRAVENOUS

## 2018-11-24 SURGICAL SUPPLY — 31 items
ADH SKN CLS APL DERMABOND .7 (GAUZE/BANDAGES/DRESSINGS) ×1
ARMBAND PINK RESTRICT EXTREMIT (MISCELLANEOUS) ×3 IMPLANT
CANISTER SUCT 3000ML PPV (MISCELLANEOUS) ×3 IMPLANT
CLIP VESOCCLUDE MED 24/CT (CLIP) IMPLANT
CLIP VESOCCLUDE MED 6/CT (CLIP) IMPLANT
CLIP VESOCCLUDE SM WIDE 24/CT (CLIP) IMPLANT
CLIP VESOCCLUDE SM WIDE 6/CT (CLIP) IMPLANT
COVER PROBE W GEL 5X96 (DRAPES) ×3 IMPLANT
COVER WAND RF STERILE (DRAPES) ×3 IMPLANT
DERMABOND ADVANCED (GAUZE/BANDAGES/DRESSINGS) ×2
DERMABOND ADVANCED .7 DNX12 (GAUZE/BANDAGES/DRESSINGS) ×1 IMPLANT
ELECT REM PT RETURN 9FT ADLT (ELECTROSURGICAL) ×3
ELECTRODE REM PT RTRN 9FT ADLT (ELECTROSURGICAL) ×1 IMPLANT
GLOVE BIO SURGEON STRL SZ7.5 (GLOVE) ×3 IMPLANT
GOWN STRL REUS W/ TWL LRG LVL3 (GOWN DISPOSABLE) ×2 IMPLANT
GOWN STRL REUS W/ TWL XL LVL3 (GOWN DISPOSABLE) ×1 IMPLANT
GOWN STRL REUS W/TWL LRG LVL3 (GOWN DISPOSABLE) ×6
GOWN STRL REUS W/TWL XL LVL3 (GOWN DISPOSABLE) ×3
KIT BASIN OR (CUSTOM PROCEDURE TRAY) ×3 IMPLANT
KIT TURNOVER KIT B (KITS) ×3 IMPLANT
NS IRRIG 1000ML POUR BTL (IV SOLUTION) ×3 IMPLANT
PACK CV ACCESS (CUSTOM PROCEDURE TRAY) ×3 IMPLANT
PAD ARMBOARD 7.5X6 YLW CONV (MISCELLANEOUS) ×6 IMPLANT
SUT MNCRL AB 4-0 PS2 18 (SUTURE) ×3 IMPLANT
SUT PROLENE 6 0 BV (SUTURE) ×3 IMPLANT
SUT SILK 2 0 SH (SUTURE) IMPLANT
SUT VIC AB 3-0 SH 27 (SUTURE) ×3
SUT VIC AB 3-0 SH 27X BRD (SUTURE) ×1 IMPLANT
TOWEL GREEN STERILE (TOWEL DISPOSABLE) ×3 IMPLANT
UNDERPAD 30X30 (UNDERPADS AND DIAPERS) ×3 IMPLANT
WATER STERILE IRR 1000ML POUR (IV SOLUTION) ×3 IMPLANT

## 2018-11-24 NOTE — Progress Notes (Signed)
Pre-procedure BP 207/78; Dr. Doroteo Glassman aware and at the bedside.  No new orders other than meds for moderate sedation/nerve block.

## 2018-11-24 NOTE — H&P (Signed)
HPI:  This is a 68 y.o. female who is s/p first stage basilic av fistula.  She denise pain, loss of sensation and motor.  She is currently on HD via [facility].         Allergies  Allergen Reactions  . Cyclobenzaprine Anaphylaxis and Other (See Comments)    "stopped heart"   . Morphine Anaphylaxis    "stopped heart"  . Penicillins Shortness Of Breath, Swelling and Palpitations    Did it involve swelling of the face/tongue/throat, SOB, or low BP? Yes Did it involve sudden or severe rash/hives, skin peeling, or any reaction on the inside of your mouth or nose? No Did you need to seek medical attention at a hospital or doctor's office? No When did it last happen?years  If all above answers are "NO", may proceed with cephalosporin use.   Marland Kitchen Hydromorphone Other (See Comments)    If administered quickly, felt like hand was "exploding"   . Codeine Itching and Rash          Current Outpatient Medications  Medication Sig Dispense Refill  . acetaminophen (TYLENOL) 500 MG tablet Take 1,000 mg by mouth every 6 (six) hours as needed for moderate pain or headache.    . albuterol (PROVENTIL) (2.5 MG/3ML) 0.083% nebulizer solution Take 2.5 mg by nebulization every 6 (six) hours as needed for wheezing or shortness of breath.    . allopurinol (ZYLOPRIM) 100 MG tablet Take 100 mg by mouth daily.    Marland Kitchen amLODipine (NORVASC) 10 MG tablet Take 10 mg by mouth at bedtime.    . Carboxymethylcellul-Glycerin (LUBRICATING EYE DROPS OP) Place 1 drop into both eyes daily as needed (dry eyes).    . Cholecalciferol (VITAMIN D3) 50 MCG (2000 UT) capsule Take 2,000 Units by mouth every morning.    . dicyclomine (BENTYL) 10 MG capsule Take 20 mg by mouth 2 (two) times a day.     . diphenhydrAMINE (BENADRYL) 25 MG tablet Take 25 mg by mouth daily as needed for allergies.    Marland Kitchen docusate sodium (COLACE) 100 MG capsule Take 1 capsule (100 mg total) by mouth 2 (two) times daily. 30 capsule  0  . doxylamine, Sleep, (UNISOM) 25 MG tablet Take 25 mg by mouth at bedtime as needed for sleep.    . Ipratropium-Albuterol (COMBIVENT) 20-100 MCG/ACT AERS respimat Inhale 2 puffs into the lungs 4 (four) times daily as needed for wheezing.    Marland Kitchen labetalol (NORMODYNE) 200 MG tablet Take 200 mg by mouth 2 (two) times daily.    . nicotine (NICODERM CQ - DOSED IN MG/24 HOURS) 21 mg/24hr patch Place 21 mg onto the skin daily as needed (smoking cessation).    . nitroGLYCERIN (NITROSTAT) 0.4 MG SL tablet Place 0.4 mg under the tongue every 5 (five) minutes as needed for chest pain.     . pantoprazole (PROTONIX) 40 MG tablet Take 1 tablet (40 mg total) by mouth daily.    . polyethylene glycol (MIRALAX / GLYCOLAX) 17 g packet Take 17 g by mouth 2 (two) times daily.    . pravastatin (PRAVACHOL) 80 MG tablet Take 80 mg by mouth daily.     . sennosides-docusate sodium (SENOKOT-S) 8.6-50 MG tablet Take 1 tablet by mouth daily.    . traMADol (ULTRAM) 50 MG tablet Take 1 tablet (50 mg total) by mouth every 6 (six) hours as needed. (Patient taking differently: Take 50 mg by mouth every 6 (six) hours as needed for moderate pain. )  10 tablet 0   No current facility-administered medications for this visit.      ROS:  See HPI  Physical Exam:  +------------+----------+-------------+----------+--------+ OUTFLOW VEINPSV (cm/s)Diameter (cm)Depth (cm)Describe +------------+----------+-------------+----------+--------+ Prox UA  103  0.75  0.71   +------------+----------+-------------+----------+--------+ Mid UA  85  0.79  0.47   +------------+----------+-------------+----------+--------+ Dist UA  237  0.81  0.26   +------------+----------+-------------+----------+--------+ AC Fossa  329  0.68  0.39    +------------+----------+-------------+----------+--------+   Incision:  Well healed Extremities:  Grip 5/5 and sensation intact and equal B UE Lungs non labored breathing Heart RRR  Assessment/Plan:  This is a 68 y.o. female who is s/p:Left arm for stage basilic vein AV fistula creation 08/24/2018. Plan for left 2nd stage bvt in OR today.   Pacey Willadsen C. Donzetta Matters, MD [facility] [place] Office: [phone] Pager: [phone]

## 2018-11-24 NOTE — Progress Notes (Signed)
Orthopedic Tech Progress Note Patient Details:  Christina [name] 1950/08/06 [id number]  Ortho Devices Type of Ortho Device: Arm sling Ortho Device/Splint Location: left Ortho Device/Splint Interventions: Application   Post Interventions Patient Tolerated: Well Instructions Provided: Care of device   Christina [name] 11/24/2018, 10:44 AM

## 2018-11-24 NOTE — Anesthesia Procedure Notes (Signed)
Procedure Name: MAC Date/Time: 11/24/2018 8:46 AM Performed by: Alain Marion, CRNA Pre-anesthesia Checklist: Patient identified, Emergency Drugs available, Suction available, Patient being monitored and Timeout performed Oxygen Delivery Method: Simple face mask Placement Confirmation: positive ETCO2

## 2018-11-24 NOTE — Op Note (Signed)
    Patient name: Christina [name] MRN: [id number] DOB: 1950-10-16 Sex: female  11/24/2018 Pre-operative Diagnosis: esrd Post-operative diagnosis:  Same Surgeon:  Erlene Quan C. Donzetta Matters, MDa Procedure Performed:  Left arm second stage basilic vein transposition fistula creation  Indications: 69 year old female on dialysis via tunneled catheter.  She has a left arm first stage fistula that is now indicated for second stage.  Findings: Fistula throughout its course was approximately 1 cm diameter.  At completion there was a very strong thrill and a signal at the radial and ulnar arteries at the wrist that did augment with compression of the fistula.   Procedure:  The patient was identified in the holding area and taken to the operating room where she was placed to preoperatively.  A preoperative block of been placed.  Monitored anesthesia care was performed throughout the case.  Timeout was called and antibiotics were administered.  The previous incision was opened we dissected down to the fistula.  We divided branches between clips and ties.  We made a second incision towards the axilla.  We fully mobilized the vein.  We marked her for orientation.  We clamped it near the antecubitum divided.  We flushed with heparinized saline tunneled it laterally.  We spatulated both ends and sewed end-to-end with 6-0 Prolene suture.  Prior completion anastomosis without flushing all directions.  Upon completion was a very strong thrill in the fistula.  We irrigated the wounds obtained stasis closed in layers Vicryl Monocryl.  There were good signals at the wrist radial and ulnar arteries they did augment with compression of the fistula.  Dermabond was placed at the level the skin.  She was awake from anesthesia having tolerated procedure without immediate complication.  All counts were correct at completion.  EBL: 50 cc    Brandon C. Donzetta Matters, MD [facility] [place] Office: [phone] Pager:  [phone]

## 2018-11-24 NOTE — Transfer of Care (Signed)
Immediate Anesthesia Transfer of Care Note  Patient: Christina [name]  Procedure(s) Performed: SECOND STAGE BASILIC VEIN TRANSPOSITION LEFT ARM (Left )  Patient Location: PACU  Anesthesia Type:MAC combined with regional for post-op pain  Level of Consciousness: awake, alert  and oriented  Airway & Oxygen Therapy: Patient Spontanous Breathing and Patient connected to face mask oxygen  Post-op Assessment: Report given to RN and Post -op Vital signs reviewed and stable  Post vital signs: Reviewed and stable  Last Vitals:  Vitals Value Taken Time  BP 144/68 11/24/18 1002  Temp    Pulse 70 11/24/18 1008  Resp 11 11/24/18 1008  SpO2 100 % 11/24/18 1008  Vitals shown include unvalidated device data.  Last Pain:  Vitals:   11/24/18 0720  PainSc: 0-No pain         Complications: No apparent anesthesia complications

## 2018-11-24 NOTE — Anesthesia Postprocedure Evaluation (Signed)
Anesthesia Post Note  Patient: Christina [name]  Procedure(s) Performed: SECOND STAGE BASILIC VEIN TRANSPOSITION LEFT ARM (Left )     Patient location during evaluation: PACU Anesthesia Type: Regional and MAC Level of consciousness: awake and alert, oriented and patient cooperative Pain management: pain level controlled Vital Signs Assessment: post-procedure vital signs reviewed and stable Respiratory status: spontaneous breathing, nonlabored ventilation and respiratory function stable Cardiovascular status: blood pressure returned to baseline and stable Postop Assessment: no apparent nausea or vomiting Anesthetic complications: no    Last Vitals:  Vitals:   11/24/18 0835 11/24/18 0840  BP: (!) 145/70 (!) 205/95  Pulse: 78 76  Resp: (!) 22 (!) 21  Temp:    SpO2: 100% 100%    Last Pain:  Vitals:   11/24/18 0720  PainSc: 0-No pain                 Pervis Hocking

## 2018-11-24 NOTE — Anesthesia Procedure Notes (Signed)
Anesthesia Regional Block: Supraclavicular block   Pre-Anesthetic Checklist: ,, timeout performed, Correct Patient, Correct Site, Correct Laterality, Correct Procedure, Correct Position, site marked, Risks and benefits discussed,  Surgical consent,  Pre-op evaluation,  At surgeon's request and post-op pain management  Laterality: Left  Prep: Maximum Sterile Barrier Precautions used, chloraprep       Needles:  Injection technique: Single-shot  Needle Type: Echogenic Stimulator Needle     Needle Length: 9cm  Needle Gauge: 22     Additional Needles:   Procedures:,,,, ultrasound used (permanent image in chart),,,,  Narrative:  Start time: 11/24/2018 8:20 AM End time: 11/24/2018 8:30 AM Injection made incrementally with aspirations every 5 mL.  Performed by: Personally  Anesthesiologist: Pervis Hocking, DO  Additional Notes: Monitors applied. No increased pain on injection. No increased resistance to injection. Injection made in 5cc increments. Good needle visualization. Patient tolerated procedure well.

## 2018-11-25 ENCOUNTER — Encounter (HOSPITAL_COMMUNITY): Payer: Self-pay | Admitting: Vascular Surgery

## 2019-01-03 ENCOUNTER — Telehealth: Payer: Self-pay
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
## 2019-01-07 ENCOUNTER — Encounter: Payer: Medicare Other | Admitting: Family

## 2019-01-17 ENCOUNTER — Encounter: Payer: Medicare Other | Admitting: Family

## 2019-01-24 ENCOUNTER — Encounter (HOSPITAL_COMMUNITY): Payer: Self-pay | Admitting: Emergency Medicine

## 2019-01-24 ENCOUNTER — Other Ambulatory Visit: Payer: Self-pay

## 2019-01-24 ENCOUNTER — Emergency Department (HOSPITAL_COMMUNITY): Payer: Medicare Other

## 2019-01-24 ENCOUNTER — Inpatient Hospital Stay (HOSPITAL_COMMUNITY)
Admission: EM | Admit: 2019-01-24 | Discharge: 2019-01-26 | DRG: 308 | Disposition: A | Discharge status: Home / Self Care | Payer: Medicare Other | Attending: Internal Medicine | Admitting: Internal Medicine

## 2019-01-24 DIAGNOSIS — N2581 Secondary hyperparathyroidism of renal origin: Secondary | ICD-10-CM | POA: Diagnosis present

## 2019-01-24 DIAGNOSIS — Z888 Allergy status to other drugs, medicaments and biological substances status: Secondary | ICD-10-CM

## 2019-01-24 DIAGNOSIS — I132 Hypertensive heart and chronic kidney disease with heart failure and with stage 5 chronic kidney disease, or end stage renal disease: Secondary | ICD-10-CM | POA: Diagnosis present

## 2019-01-24 DIAGNOSIS — E785 Hyperlipidemia, unspecified: Secondary | ICD-10-CM | POA: Diagnosis present

## 2019-01-24 DIAGNOSIS — Z87442 Personal history of urinary calculi: Secondary | ICD-10-CM

## 2019-01-24 DIAGNOSIS — I2510 Atherosclerotic heart disease of native coronary artery without angina pectoris: Secondary | ICD-10-CM | POA: Diagnosis present

## 2019-01-24 DIAGNOSIS — Z7951 Long term (current) use of inhaled steroids: Secondary | ICD-10-CM

## 2019-01-24 DIAGNOSIS — Z9119 Patient's noncompliance with other medical treatment and regimen: Secondary | ICD-10-CM

## 2019-01-24 DIAGNOSIS — I639 Cerebral infarction, unspecified: Secondary | ICD-10-CM | POA: Diagnosis present

## 2019-01-24 DIAGNOSIS — Z79899 Other long term (current) drug therapy: Secondary | ICD-10-CM

## 2019-01-24 DIAGNOSIS — Z955 Presence of coronary angioplasty implant and graft: Secondary | ICD-10-CM

## 2019-01-24 DIAGNOSIS — K219 Gastro-esophageal reflux disease without esophagitis: Secondary | ICD-10-CM | POA: Diagnosis present

## 2019-01-24 DIAGNOSIS — Z992 Dependence on renal dialysis: Secondary | ICD-10-CM

## 2019-01-24 DIAGNOSIS — I4891 Unspecified atrial fibrillation: Secondary | ICD-10-CM | POA: Diagnosis present

## 2019-01-24 DIAGNOSIS — I5032 Chronic diastolic (congestive) heart failure: Secondary | ICD-10-CM | POA: Diagnosis present

## 2019-01-24 DIAGNOSIS — J4489 Other specified chronic obstructive pulmonary disease: Secondary | ICD-10-CM | POA: Diagnosis present

## 2019-01-24 DIAGNOSIS — Z885 Allergy status to narcotic agent status: Secondary | ICD-10-CM

## 2019-01-24 DIAGNOSIS — Z8673 Personal history of transient ischemic attack (TIA), and cerebral infarction without residual deficits: Secondary | ICD-10-CM

## 2019-01-24 DIAGNOSIS — R296 Repeated falls: Secondary | ICD-10-CM | POA: Diagnosis present

## 2019-01-24 DIAGNOSIS — Z7982 Long term (current) use of aspirin: Secondary | ICD-10-CM

## 2019-01-24 DIAGNOSIS — Z8701 Personal history of pneumonia (recurrent): Secondary | ICD-10-CM

## 2019-01-24 DIAGNOSIS — I480 Paroxysmal atrial fibrillation: Secondary | ICD-10-CM | POA: Diagnosis not present

## 2019-01-24 DIAGNOSIS — I252 Old myocardial infarction: Secondary | ICD-10-CM

## 2019-01-24 DIAGNOSIS — F319 Bipolar disorder, unspecified: Secondary | ICD-10-CM | POA: Diagnosis present

## 2019-01-24 DIAGNOSIS — M109 Gout, unspecified: Secondary | ICD-10-CM | POA: Diagnosis present

## 2019-01-24 DIAGNOSIS — R0602 Shortness of breath: Secondary | ICD-10-CM

## 2019-01-24 DIAGNOSIS — I10 Essential (primary) hypertension: Secondary | ICD-10-CM | POA: Diagnosis present

## 2019-01-24 DIAGNOSIS — J449 Chronic obstructive pulmonary disease, unspecified: Secondary | ICD-10-CM | POA: Diagnosis present

## 2019-01-24 DIAGNOSIS — M1990 Unspecified osteoarthritis, unspecified site: Secondary | ICD-10-CM | POA: Diagnosis present

## 2019-01-24 DIAGNOSIS — Z880 Allergy status to penicillin: Secondary | ICD-10-CM

## 2019-01-24 DIAGNOSIS — Z87892 Personal history of anaphylaxis: Secondary | ICD-10-CM

## 2019-01-24 DIAGNOSIS — Z87891 Personal history of nicotine dependence: Secondary | ICD-10-CM

## 2019-01-24 DIAGNOSIS — Z66 Do not resuscitate: Secondary | ICD-10-CM | POA: Diagnosis present

## 2019-01-24 DIAGNOSIS — D631 Anemia in chronic kidney disease: Secondary | ICD-10-CM | POA: Diagnosis present

## 2019-01-24 DIAGNOSIS — M898X9 Other specified disorders of bone, unspecified site: Secondary | ICD-10-CM | POA: Diagnosis present

## 2019-01-24 DIAGNOSIS — Z20828 Contact with and (suspected) exposure to other viral communicable diseases: Secondary | ICD-10-CM | POA: Diagnosis present

## 2019-01-24 DIAGNOSIS — N186 End stage renal disease: Secondary | ICD-10-CM | POA: Diagnosis present

## 2019-01-24 LAB — CBC WITH DIFFERENTIAL/PLATELET
Abs Immature Granulocytes: 0.04 10*3/uL (ref 0.00–0.07)
Basophils Absolute: 0 10*3/uL (ref 0.0–0.1)
Basophils Relative: 1 %
Eosinophils Absolute: 0.3 10*3/uL (ref 0.0–0.5)
Eosinophils Relative: 3 %
HCT: 37.7 % (ref 36.0–46.0)
Hemoglobin: 12 g/dL (ref 12.0–15.0)
Immature Granulocytes: 1 %
Lymphocytes Relative: 13 %
Lymphs Abs: 1 10*3/uL (ref 0.7–4.0)
MCH: 30.2 pg (ref 26.0–34.0)
MCHC: 31.8 g/dL (ref 30.0–36.0)
MCV: 95 fL (ref 80.0–100.0)
Monocytes Absolute: 0.6 10*3/uL (ref 0.1–1.0)
Monocytes Relative: 8 %
Neutro Abs: 6.1 10*3/uL (ref 1.7–7.7)
Neutrophils Relative %: 74 %
Platelets: 174 10*3/uL (ref 150–400)
RBC: 3.97 MIL/uL (ref 3.87–5.11)
RDW: 15.6 % — ABNORMAL HIGH (ref 11.5–15.5)
WBC: 8.1 10*3/uL (ref 4.0–10.5)
nRBC: 0 % (ref 0.0–0.2)

## 2019-01-24 LAB — BRAIN NATRIURETIC PEPTIDE: B Natriuretic Peptide: 1570 pg/mL — ABNORMAL HIGH (ref 0.0–100.0)

## 2019-01-24 LAB — CBG MONITORING, ED: Glucose-Capillary: 79 mg/dL (ref 70–99)

## 2019-01-24 NOTE — ED Triage Notes (Signed)
Pt brought to Ed by [facility] from home for increase SOB, with hx of COPD, CHF and HD pt, las HD last Tuesday. Initially SPO2 84% on RA placed on 2L [place] by EMS with SPO2 increase to 95%., SR on monitor. BP 190/98, HR 92, CBG 67 oral glucose given by EMS with blood sugar up to 69.

## 2019-01-25 ENCOUNTER — Observation Stay (HOSPITAL_BASED_OUTPATIENT_CLINIC_OR_DEPARTMENT_OTHER): Payer: Medicare Other

## 2019-01-25 DIAGNOSIS — J449 Chronic obstructive pulmonary disease, unspecified: Secondary | ICD-10-CM | POA: Diagnosis present

## 2019-01-25 DIAGNOSIS — N186 End stage renal disease: Secondary | ICD-10-CM | POA: Diagnosis present

## 2019-01-25 DIAGNOSIS — R296 Repeated falls: Secondary | ICD-10-CM | POA: Diagnosis present

## 2019-01-25 DIAGNOSIS — Z20828 Contact with and (suspected) exposure to other viral communicable diseases: Secondary | ICD-10-CM | POA: Diagnosis present

## 2019-01-25 DIAGNOSIS — K219 Gastro-esophageal reflux disease without esophagitis: Secondary | ICD-10-CM | POA: Diagnosis present

## 2019-01-25 DIAGNOSIS — Z79899 Other long term (current) drug therapy: Secondary | ICD-10-CM | POA: Diagnosis not present

## 2019-01-25 DIAGNOSIS — Z955 Presence of coronary angioplasty implant and graft: Secondary | ICD-10-CM | POA: Diagnosis not present

## 2019-01-25 DIAGNOSIS — D631 Anemia in chronic kidney disease: Secondary | ICD-10-CM | POA: Diagnosis present

## 2019-01-25 DIAGNOSIS — I5032 Chronic diastolic (congestive) heart failure: Secondary | ICD-10-CM | POA: Diagnosis present

## 2019-01-25 DIAGNOSIS — R0602 Shortness of breath: Secondary | ICD-10-CM | POA: Diagnosis present

## 2019-01-25 DIAGNOSIS — M109 Gout, unspecified: Secondary | ICD-10-CM | POA: Diagnosis present

## 2019-01-25 DIAGNOSIS — F319 Bipolar disorder, unspecified: Secondary | ICD-10-CM | POA: Diagnosis present

## 2019-01-25 DIAGNOSIS — I361 Nonrheumatic tricuspid (valve) insufficiency: Secondary | ICD-10-CM | POA: Diagnosis not present

## 2019-01-25 DIAGNOSIS — M898X9 Other specified disorders of bone, unspecified site: Secondary | ICD-10-CM | POA: Diagnosis present

## 2019-01-25 DIAGNOSIS — Z7982 Long term (current) use of aspirin: Secondary | ICD-10-CM | POA: Diagnosis not present

## 2019-01-25 DIAGNOSIS — Z8673 Personal history of transient ischemic attack (TIA), and cerebral infarction without residual deficits: Secondary | ICD-10-CM | POA: Diagnosis not present

## 2019-01-25 DIAGNOSIS — Z992 Dependence on renal dialysis: Secondary | ICD-10-CM | POA: Diagnosis not present

## 2019-01-25 DIAGNOSIS — I340 Nonrheumatic mitral (valve) insufficiency: Secondary | ICD-10-CM | POA: Diagnosis not present

## 2019-01-25 DIAGNOSIS — E785 Hyperlipidemia, unspecified: Secondary | ICD-10-CM | POA: Diagnosis present

## 2019-01-25 DIAGNOSIS — Z7951 Long term (current) use of inhaled steroids: Secondary | ICD-10-CM | POA: Diagnosis not present

## 2019-01-25 DIAGNOSIS — I252 Old myocardial infarction: Secondary | ICD-10-CM | POA: Diagnosis not present

## 2019-01-25 DIAGNOSIS — I4891 Unspecified atrial fibrillation: Secondary | ICD-10-CM | POA: Insufficient documentation

## 2019-01-25 DIAGNOSIS — M1990 Unspecified osteoarthritis, unspecified site: Secondary | ICD-10-CM | POA: Diagnosis present

## 2019-01-25 DIAGNOSIS — I132 Hypertensive heart and chronic kidney disease with heart failure and with stage 5 chronic kidney disease, or end stage renal disease: Secondary | ICD-10-CM | POA: Diagnosis present

## 2019-01-25 DIAGNOSIS — I480 Paroxysmal atrial fibrillation: Secondary | ICD-10-CM | POA: Diagnosis present

## 2019-01-25 DIAGNOSIS — Z66 Do not resuscitate: Secondary | ICD-10-CM | POA: Diagnosis present

## 2019-01-25 DIAGNOSIS — I2510 Atherosclerotic heart disease of native coronary artery without angina pectoris: Secondary | ICD-10-CM | POA: Diagnosis present

## 2019-01-25 DIAGNOSIS — N2581 Secondary hyperparathyroidism of renal origin: Secondary | ICD-10-CM | POA: Diagnosis present

## 2019-01-25 LAB — COMPREHENSIVE METABOLIC PANEL
ALT: 11 U/L (ref 0–44)
AST: 15 U/L (ref 15–41)
Albumin: 3.4 g/dL — ABNORMAL LOW (ref 3.5–5.0)
Alkaline Phosphatase: 59 U/L (ref 38–126)
Anion gap: 14 (ref 5–15)
BUN: 55 mg/dL — ABNORMAL HIGH (ref 8–23)
CO2: 20 mmol/L — ABNORMAL LOW (ref 22–32)
Calcium: 8.1 mg/dL — ABNORMAL LOW (ref 8.9–10.3)
Chloride: 104 mmol/L (ref 98–111)
Creatinine, Ser: 7.77 mg/dL — ABNORMAL HIGH (ref 0.44–1.00)
GFR calc Af Amer: 6 mL/min — ABNORMAL LOW (ref >60)
GFR calc non Af Amer: 5 mL/min — ABNORMAL LOW (ref >60)
Glucose, Bld: 120 mg/dL — ABNORMAL HIGH (ref 70–99)
Potassium: 3.9 mmol/L (ref 3.5–5.1)
Sodium: 138 mmol/L (ref 135–145)
Total Bilirubin: 1.3 mg/dL — ABNORMAL HIGH (ref 0.3–1.2)
Total Protein: 6.3 g/dL — ABNORMAL LOW (ref 6.5–8.1)

## 2019-01-25 LAB — TROPONIN I (HIGH SENSITIVITY)
Troponin I (High Sensitivity): 25 ng/L — ABNORMAL HIGH (ref <18)
Troponin I (High Sensitivity): 27 ng/L — ABNORMAL HIGH (ref <18)
Troponin I (High Sensitivity): 27 ng/L — ABNORMAL HIGH (ref <18)
Troponin I (High Sensitivity): 28 ng/L — ABNORMAL HIGH (ref <18)

## 2019-01-25 LAB — HEPARIN LEVEL (UNFRACTIONATED): Heparin Unfractionated: 0.28 IU/mL — ABNORMAL LOW (ref 0.30–0.70)

## 2019-01-25 LAB — T4, FREE: Free T4: 0.87 ng/dL (ref 0.61–1.12)

## 2019-01-25 LAB — SARS CORONAVIRUS 2 (TAT 6-24 HRS): SARS Coronavirus 2: NEGATIVE

## 2019-01-25 LAB — PROTIME-INR
INR: 1 (ref 0.8–1.2)
Prothrombin Time: 12.7 seconds (ref 11.4–15.2)

## 2019-01-25 LAB — ECHOCARDIOGRAM COMPLETE

## 2019-01-25 MED ORDER — PRAVASTATIN SODIUM 40 MG PO TABS
80.0000 mg | ORAL_TABLET | Freq: Every evening | ORAL | Status: DC
  Administered 2019-01-25: 80 mg via ORAL
  Filled 2019-01-25 (×2): qty 2

## 2019-01-25 MED ORDER — WARFARIN SODIUM 4 MG PO TABS
4.0000 mg | ORAL_TABLET | Freq: Once | ORAL | Status: AC
  Administered 2019-01-25: 4 mg via ORAL
  Filled 2019-01-25 (×2): qty 1

## 2019-01-25 MED ORDER — FERRIC CITRATE 1 GM 210 MG(FE) PO TABS
630.0000 mg | ORAL_TABLET | Freq: Three times a day (TID) | ORAL | Status: DC
  Administered 2019-01-25 – 2019-01-26 (×3): 630 mg via ORAL
  Filled 2019-01-25 (×4): qty 3

## 2019-01-25 MED ORDER — ALBUTEROL SULFATE (2.5 MG/3ML) 0.083% IN NEBU
2.5000 mg | INHALATION_SOLUTION | Freq: Four times a day (QID) | RESPIRATORY_TRACT | Status: DC | PRN
  Administered 2019-01-25: 2.5 mg via RESPIRATORY_TRACT
  Filled 2019-01-25: qty 3

## 2019-01-25 MED ORDER — METOPROLOL TARTRATE 50 MG PO TABS
100.0000 mg | ORAL_TABLET | Freq: Two times a day (BID) | ORAL | Status: DC
  Administered 2019-01-25 – 2019-01-26 (×3): 100 mg via ORAL
  Filled 2019-01-25 (×2): qty 2
  Filled 2019-01-25: qty 4

## 2019-01-25 MED ORDER — PREDNISONE 20 MG PO TABS
60.0000 mg | ORAL_TABLET | Freq: Once | ORAL | Status: AC
  Administered 2019-01-25: 60 mg via ORAL
  Filled 2019-01-25: qty 3

## 2019-01-25 MED ORDER — FLUTICASONE FUROATE-VILANTEROL 200-25 MCG/INH IN AEPB
1.0000 | INHALATION_SPRAY | Freq: Every day | RESPIRATORY_TRACT | Status: DC
  Administered 2019-01-25 – 2019-01-26 (×2): 1 via RESPIRATORY_TRACT
  Filled 2019-01-25 (×2): qty 28

## 2019-01-25 MED ORDER — DILTIAZEM HCL-DEXTROSE 125-5 MG/125ML-% IV SOLN (PREMIX)
5.0000 mg/h | INTRAVENOUS | Status: DC
  Administered 2019-01-25: 5 mg/h via INTRAVENOUS
  Filled 2019-01-25: qty 125

## 2019-01-25 MED ORDER — ALBUTEROL SULFATE HFA 108 (90 BASE) MCG/ACT IN AERS
8.0000 | INHALATION_SPRAY | Freq: Once | RESPIRATORY_TRACT | Status: AC
  Administered 2019-01-25: 8 via RESPIRATORY_TRACT
  Filled 2019-01-25: qty 6.7

## 2019-01-25 MED ORDER — CHLORHEXIDINE GLUCONATE CLOTH 2 % EX PADS
6.0000 | MEDICATED_PAD | Freq: Every day | CUTANEOUS | Status: DC
  Administered 2019-01-26: 6 via TOPICAL

## 2019-01-25 MED ORDER — SODIUM CHLORIDE 0.9% FLUSH
3.0000 mL | Freq: Two times a day (BID) | INTRAVENOUS | Status: DC
  Administered 2019-01-25: 3 mL via INTRAVENOUS

## 2019-01-25 MED ORDER — HEPARIN (PORCINE) 25000 UT/250ML-% IV SOLN
750.0000 [IU]/h | INTRAVENOUS | Status: DC
  Administered 2019-01-25: 650 [IU]/h via INTRAVENOUS
  Filled 2019-01-25: qty 250

## 2019-01-25 MED ORDER — GABAPENTIN 300 MG PO CAPS: 600.0000 mg | ORAL_CAPSULE | Freq: Every evening | ORAL | Status: DC | PRN

## 2019-01-25 MED ORDER — AEROCHAMBER PLUS FLO-VU LARGE MISC
Status: AC
  Administered 2019-01-25: 1
  Filled 2019-01-25: qty 1

## 2019-01-25 MED ORDER — DILTIAZEM LOAD VIA INFUSION
10.0000 mg | Freq: Once | INTRAVENOUS | Status: AC
  Administered 2019-01-25: 10 mg via INTRAVENOUS
  Filled 2019-01-25: qty 10

## 2019-01-25 MED ORDER — FLUTICASONE PROPIONATE 50 MCG/ACT NA SUSP
2.0000 | Freq: Every day | NASAL | Status: DC
  Administered 2019-01-25 – 2019-01-26 (×2): 2 via NASAL
  Filled 2019-01-25: qty 16

## 2019-01-25 MED ORDER — IPRATROPIUM BROMIDE HFA 17 MCG/ACT IN AERS
2.0000 | INHALATION_SPRAY | Freq: Once | RESPIRATORY_TRACT | Status: AC
  Administered 2019-01-25: 2 via RESPIRATORY_TRACT
  Filled 2019-01-25: qty 12.9

## 2019-01-25 MED ORDER — ASPIRIN EC 81 MG PO TBEC
81.0000 mg | DELAYED_RELEASE_TABLET | Freq: Every day | ORAL | Status: DC
  Administered 2019-01-25 – 2019-01-26 (×2): 81 mg via ORAL
  Filled 2019-01-25 (×2): qty 1

## 2019-01-25 MED ORDER — SENNOSIDES-DOCUSATE SODIUM 8.6-50 MG PO TABS
1.0000 | ORAL_TABLET | Freq: Two times a day (BID) | ORAL | Status: DC
  Administered 2019-01-25 – 2019-01-26 (×2): 1 via ORAL
  Filled 2019-01-25 (×2): qty 1

## 2019-01-25 MED ORDER — SODIUM CHLORIDE 0.9% FLUSH: 3.0000 mL | INTRAVENOUS | Status: DC | PRN

## 2019-01-25 MED ORDER — LABETALOL HCL 5 MG/ML IV SOLN
10.0000 mg | Freq: Once | INTRAVENOUS | Status: AC
  Administered 2019-01-25: 10 mg via INTRAVENOUS
  Filled 2019-01-25: qty 4

## 2019-01-25 MED ORDER — CALCITRIOL 0.25 MCG PO CAPS: 0.7500 ug | ORAL_CAPSULE | ORAL | Status: DC

## 2019-01-25 MED ORDER — SODIUM CHLORIDE 0.9 % IV SOLN: 250.0000 mL | INTRAVENOUS | Status: DC | PRN

## 2019-01-25 MED ORDER — GUAIFENESIN 100 MG/5ML PO SOLN
5.0000 mL | Freq: Four times a day (QID) | ORAL | Status: DC
  Administered 2019-01-25 – 2019-01-26 (×3): 100 mg via ORAL
  Filled 2019-01-25 (×3): qty 5

## 2019-01-25 MED ORDER — ISOSORBIDE MONONITRATE ER 30 MG PO TB24
30.0000 mg | ORAL_TABLET | Freq: Every day | ORAL | Status: DC
  Administered 2019-01-25 – 2019-01-26 (×2): 30 mg via ORAL
  Filled 2019-01-25 (×2): qty 1

## 2019-01-25 MED ORDER — DILTIAZEM HCL-DEXTROSE 125-5 MG/125ML-% IV SOLN (PREMIX)
5.0000 mg/h | INTRAVENOUS | Status: DC
  Administered 2019-01-25: 15 mg/h via INTRAVENOUS
  Filled 2019-01-25: qty 125

## 2019-01-25 MED ORDER — HEPARIN BOLUS VIA INFUSION
3000.0000 [IU] | Freq: Once | INTRAVENOUS | Status: AC
  Administered 2019-01-25: 3000 [IU] via INTRAVENOUS
  Filled 2019-01-25: qty 3000

## 2019-01-25 MED ORDER — WARFARIN - PHARMACIST DOSING INPATIENT: Freq: Every day | Status: DC

## 2019-01-25 MED ORDER — ALLOPURINOL 100 MG PO TABS
100.0000 mg | ORAL_TABLET | Freq: Every day | ORAL | Status: DC
  Administered 2019-01-25 – 2019-01-26 (×2): 100 mg via ORAL
  Filled 2019-01-25 (×2): qty 1

## 2019-01-25 MED ORDER — FUROSEMIDE 10 MG/ML IJ SOLN
80.0000 mg | Freq: Once | INTRAMUSCULAR | Status: AC
  Administered 2019-01-25: 80 mg via INTRAVENOUS
  Filled 2019-01-25: qty 8

## 2019-01-25 NOTE — Progress Notes (Addendum)
[facility]  I have examined Christina [name]. She has converted to NSR and has no complaints.   Principal Problem:   Atrial fibrillation with RVR  - apparently has a h/o A-fib (on the chart)  - she is on Labetalol at home- will start Lopressor 100 mg BID, follow overnight for recurrence of A-fib- start Coumadin- cont Heparin -   ECHO shows EF of 40-45%, Grade 1 d CHF, mild to mod MR and left atrial dilation  - have ordered free T4  Active Problems:   ESRD (end stage renal disease)  Fluid overloaded - CXR> Trace bilateral pleural effusions with mild diffuse interstitial and ground-glass opacity, suspect for mild edema. - dialysis Tu/Th/Sat - have notified renal team of admission    CAD (coronary artery disease), native coronary artery with stents -holding Aspirin - now on Coumadin- cont Statin    COPD with chronic bronchitis  - she has a chronic cough and states her cough and dyspnea are no worse than baseline - cont Nebs/inhalers - she received a dose of Prednisone in the ED but I have not continued it    Essential hypertension  - changing Labetalol to Lopressor - resume Imdur  AOCD and Iron deficiency - cont Auryxia  Gout - cont Allopurinol  Debbe Odea, MD

## 2019-01-25 NOTE — ED Provider Notes (Signed)
Emergency Department Provider Note   I have reviewed the triage vital signs and the nursing notes.   HISTORY  Chief Complaint Shortness of Breath   HPI Christina [name] is a 68 y.o. female who presents to the ED for SOB. Has been dyspneic for awhile but on Monday patient with increasing SOB and cough prompting her to call EMS. Apparently on arrival with EMS, sats were 84% on RA. But patient has been in ED for >4 hours and is not requiring o2 at this time. No lower extremity edema. No chest pain (aside from that associated with cough). No infectious symptoms.    No other associated or modifying symptoms.    Past Medical History:  Diagnosis Date  . Anemia   . Anxiety   . Arthritis   . Asthma   . Atrial fibrillation ([place])   . Chronic kidney disease    Dialysis T/Th/Sa  started in March 2020  . COPD (chronic obstructive pulmonary disease) ([place])   . Coronary artery disease    2 stents  . Depression   . GERD (gastroesophageal reflux disease)   . Gout   . Headache    migraines  . History of kidney stones   . Hyperlipidemia   . Hypertension   . Pneumonia   . Small bowel obstruction [facility])     Patient Active Problem List   Diagnosis Date Noted  . SBO (small bowel obstruction) ([place]) 08/10/2018  . Anemia due to chronic blood loss   . Heme positive stool   . Platelet inhibition due to Plavix   . Acute GI bleeding 05/06/2018  . ESRD (end stage renal disease) ([place]) 05/06/2018  . Anxiety 05/06/2018  . Bipolar affective ([place]) 05/06/2018  . CAD (coronary artery disease), native coronary artery 05/06/2018  . COPD with chronic bronchitis ([place]) 05/06/2018  . CVA (cerebral vascular accident) ([place]) 05/06/2018  . Essential hypertension 05/06/2018  . Hyperlipidemia 05/06/2018    Past Surgical History:  Procedure Laterality Date  . ABDOMINAL HYSTERECTOMY    . ABDOMINAL SURGERY     for small bowel obstruction - x 2  . APPENDECTOMY    . AV FISTULA PLACEMENT Left 08/04/2018   Procedure: ARTERIOVENOUS (AV) FISTULA CREATION LEFT ARM;  Surgeon: Waynetta Sandy, MD;  Location: [place];  Service: Vascular;  Laterality: Left;  . BASCILIC VEIN TRANSPOSITION Left 11/24/2018   Procedure: SECOND STAGE BASILIC VEIN TRANSPOSITION LEFT ARM;  Surgeon: Waynetta Sandy, MD;  Location: [place];  Service: Vascular;  Laterality: Left;  . CARDIAC CATHETERIZATION    . CORONARY ANGIOPLASTY  ?2003/2004  . FACIAL RECONSTRUCTION SURGERY     x 2  . HERNIA REPAIR      Current Outpatient Rx  . Order #: [id number] Class: Historical Med  . Order #: [id number] Class: Historical Med  . Order #: [id number] Class: Historical Med  . Order #: [id number] Class: Historical Med  . Order #: [id number] Class: Historical Med  . Order #: [id number] Class: Historical Med  . Order #: [id number] Class: Historical Med  . Order #: [id number] Class: Historical Med  . Order #: [id number] Class: Normal  . Order #: [id number] Class: Historical Med  . Order #: [id number] Class: Historical Med  . Order #: [id number] Class: Historical Med  . Order #: [id number] Class: Historical Med  . Order #: [id number] Class: Historical Med  . Order #: [id number] Class: Historical Med  . Order #: [id number] Class: Historical Med  . Order #: [id number] Class: Historical Med  . Order #: [id number] Class: Historical Med  . Order #: [id number] Class: Normal  .  Order #: [id number] Class: No Print  . Order #: [id number] Class: No Print  . Order #: [id number] Class: Historical Med  . Order #: [id number] Class: Historical Med  . Order #: [id number] Class: Historical Med    Allergies Cyclobenzaprine, Morphine, Penicillins, Hydromorphone, and Codeine  No family history on file.  Social History Social History   Tobacco Use  . Smoking status: Former Research scientist (life sciences)  . Smokeless tobacco: Never Used  Substance Use Topics  . Alcohol use: Not Currently    Comment: quit about 6 months ago - "I was a drunk"  . Drug use: Not Currently    Types: Marijuana     Comment: h/o drug use - "I was raised in the 60s"; last use 18 months ago; smokes marijuana periodically    Review of Systems  All other systems negative except as documented in the HPI. All pertinent positives and negatives as reviewed in the HPI. ____________________________________________   PHYSICAL EXAM:  VITAL SIGNS: ED Triage Vitals  Enc Vitals Group     BP 01/24/19 2201 (!) 135/100     Pulse Rate 01/24/19 2201 87     Resp 01/24/19 2201 18     Temp 01/24/19 2201 99.3 F (37.4 C)     Temp Source 01/24/19 2201 Oral     SpO2 01/25/19 0053 97 %     Weight --     Constitutional: Alert and oriented. Well appearing and in no acute distress. Eyes: Conjunctivae are normal. PERRL. EOMI. Head: Atraumatic. Nose: No congestion/rhinnorhea. Mouth/Throat: Mucous membranes are moist.  Oropharynx non-erythematous. Neck: No stridor.  No meningeal signs.   Cardiovascular: Normal rate, regular rhythm. Good peripheral circulation. Grossly normal heart sounds.   Respiratory: Normal respiratory effort.  No retractions. Lungs CTAB. Gastrointestinal: Soft and nontender. No distention.  Musculoskeletal: No lower extremity tenderness nor edema. No gross deformities of extremities. Neurologic:  Normal speech and language. No gross focal neurologic deficits are appreciated.  Skin:  Skin is warm, dry and intact. No rash noted.  ____________________________________________   LABS (all labs ordered are listed, but only abnormal results are displayed)  Labs Reviewed  CBC WITH DIFFERENTIAL/PLATELET - Abnormal; Notable for the following components:      Result Value   RDW 15.6 (*)    All other components within normal limits  COMPREHENSIVE METABOLIC PANEL - Abnormal; Notable for the following components:   CO2 20 (*)    Glucose, Bld 120 (*)    BUN 55 (*)    Creatinine, Ser 7.77 (*)    Calcium 8.1 (*)    Total Protein 6.3 (*)    Albumin 3.4 (*)    Total Bilirubin 1.3 (*)    GFR calc non Af  Amer 5 (*)    GFR calc Af Amer 6 (*)    All other components within normal limits  BRAIN NATRIURETIC PEPTIDE - Abnormal; Notable for the following components:   B Natriuretic Peptide 1,570.0 (*)    All other components within normal limits  TROPONIN I (HIGH SENSITIVITY) - Abnormal; Notable for the following components:   Troponin I (High Sensitivity) 27 (*)    All other components within normal limits  TROPONIN I (HIGH SENSITIVITY) - Abnormal; Notable for the following components:   Troponin I (High Sensitivity) 28 (*)    All other components within normal limits  SARS CORONAVIRUS 2 (TAT 6-24 HRS)  CBG MONITORING, ED   ____________________________________________  EKG   EKG Interpretation  Date/Time:  Monday January 24 2019 22:06:39 EST Ventricular Rate:  89 PR Interval:  154 QRS Duration: 78 QT Interval:  392 QTC Calculation: 476 R Axis:   74 Text Interpretation: Normal sinus rhythm Normal ECG No significant change since last tracing Confirmed by Merrily Pew [phone]) on 01/24/2019 11:26:42 PM       ____________________________________________  RADIOLOGY  DG Chest 2 View  Result Date: 01/24/2019 CLINICAL DATA:  Shortness of breath EXAM: CHEST - 2 VIEW COMPARISON:  10/13/2018 FINDINGS: Right-sided central venous catheter tip at the cavoatrial junction. Trace bilateral pleural effusions. Pulmonary hyperinflation. Mild diffuse bilateral interstitial and ground-glass opacity. Stable cardiomediastinal silhouette with aortic atherosclerosis. No pneumothorax. IMPRESSION: Trace bilateral pleural effusions with mild diffuse interstitial and ground-glass opacity, suspect for mild edema. Electronically Signed   By: Donavan Foil M.D.   On: 01/24/2019 22:59    ____________________________________________   PROCEDURES  Procedure(s) performed:   .Critical Care Performed by: Merrily Pew, MD Authorized by: Merrily Pew, MD   Critical care provider statement:    Critical  care time (minutes):  45   Critical care was necessary to treat or prevent imminent or life-threatening deterioration of the following conditions:  Circulatory failure and cardiac failure   Critical care was time spent personally by me on the following activities:  Discussions with consultants, evaluation of patient's response to treatment, examination of patient, ordering and performing treatments and interventions, ordering and review of laboratory studies, ordering and review of radiographic studies, pulse oximetry, re-evaluation of patient's condition, obtaining history from patient or surrogate and review of old charts     ____________________________________________   INITIAL IMPRESSION / ASSESSMENT AND PLAN / ED COURSE  Likely mild pulmonary edema. Slightly diminished breath sounds, so will try some breathing treatmetns as well as given IV Lasix. Has dialysis scheduled for today which should help quite a bit.   Upon hooking her to the cardiac monitor patient was found to be A. fib RVR.  This could very well could be related to dyspnea.  Letter know when this started patient has not a clear historian states it comes and goes sometimes and thinks that she supposed take an aspirin a day because of it but is never been on any rate control agents that she knows of.  Initially I thought maybe it was because she had not taken of the bed also gave her a dose of that did not resolve or even really improved and approximate 45 minutes to an hour so started on diltiazem.  Heart rate is rate controlled at this time in the low 100s.  Discussed with hospitalist who recommends heparin for now.  Plan for admission.   Pertinent labs & imaging results that were available during my care of the patient were reviewed by me and considered in my medical decision making (see chart for details). ____________________________________________  FINAL CLINICAL IMPRESSION(S) / ED DIAGNOSES  Final diagnoses:  Shortness  of breath  Atrial fibrillation with rapid ventricular response (HCC)     MEDICATIONS GIVEN DURING THIS VISIT:  Medications  albuterol (VENTOLIN HFA) 108 (90 Base) MCG/ACT inhaler 8 puff (8 puffs Inhalation Given 01/25/19 0232)  ipratropium (ATROVENT HFA) inhaler 2 puff (2 puffs Inhalation Given 01/25/19 0232)  predniSONE (DELTASONE) tablet 60 mg (60 mg Oral Given 01/25/19 0233)  furosemide (LASIX) injection 80 mg (80 mg Intravenous Given 01/25/19 0232)  AeroChamber Plus Flo-Vu Large MISC (1 each  Given 01/25/19 0233)     NEW OUTPATIENT MEDICATIONS STARTED DURING THIS VISIT:  New Prescriptions   No medications on file  Note:  This note was prepared with assistance of Dragon voice recognition software. Occasional wrong-word or sound-a-like substitutions may have occurred due to the inherent limitations of voice recognition software.   Khyren Hing, Corene Cornea, MD 01/25/19 [phone]

## 2019-01-25 NOTE — Plan of Care (Signed)

## 2019-01-25 NOTE — ED Notes (Signed)
Lunch Tray Ordered @ 1033. 

## 2019-01-25 NOTE — ED Notes (Signed)
Pt hr noted to be 135-160, EKG obtained, notified dr. Dolly Rias

## 2019-01-25 NOTE — ED Notes (Signed)
Report from Kelly, RN

## 2019-01-25 NOTE — Progress Notes (Signed)
Echocardiogram 2D Echocardiogram has been performed.  Christina [name] 01/25/2019, 9:54 AM

## 2019-01-25 NOTE — Progress Notes (Signed)
[place] for Heparin + warfarin Indication: atrial fibrillation  Allergies  Allergen Reactions  . Cyclobenzaprine Anaphylaxis and Other (See Comments)    "stopped heart"   . Morphine Anaphylaxis    "stopped heart"  . Penicillins Shortness Of Breath, Swelling and Palpitations    Did it involve swelling of the face/tongue/throat, SOB, or low BP? Yes Did it involve sudden or severe rash/hives, skin peeling, or any reaction on the inside of your mouth or nose? No Did you need to seek medical attention at a hospital or doctor's office? No When did it last happen?years  If all above answers are "NO", may proceed with cephalosporin use.   Marland Kitchen Hydromorphone Other (See Comments)    If administered quickly, felt like hand was "exploding"   . Codeine Itching and Rash    Patient Measurements:    Ht 61 in   Wt ~45 kg   Vital Signs: Temp: 99.7 F (37.6 C) (12/29 0214) Temp Source: Oral (12/29 0214) BP: 138/66 (12/29 0759) Pulse Rate: 75 (12/29 0759)  Labs: Recent Labs    01/24/19 2228 01/25/19 0013 01/25/19 0537 01/25/19 0722  HGB 12.0  --   --   --   HCT 37.7  --   --   --   PLT 174  --   --   --   CREATININE 7.77*  --   --   --   TROPONINIHS 27* 28* 27* 25*    CrCl cannot be calculated (Unknown ideal weight.).   Medical History: Past Medical History:  Diagnosis Date  . Anemia   . Anxiety   . Arthritis   . Asthma   . Atrial fibrillation ([place])   . Chronic kidney disease    Dialysis T/Th/Sa  started in March 2020  . COPD (chronic obstructive pulmonary disease) ([place])   . Coronary artery disease    2 stents  . Depression   . GERD (gastroesophageal reflux disease)   . Gout   . Headache    migraines  . History of kidney stones   . Hyperlipidemia   . Hypertension   . Pneumonia   . Small bowel obstruction [facility])     Assessment: 68 y.o. F with ESRD on HD presents with SOB - found to be in afib, continuing on heparin for  afib. No AC PTA. Pharmacy consulted to start warfarin. CBC and LFTs wnl. Baseline INR pending. No interacting meds noted. No active bleed issues documented.  Goal of Therapy:  Heparin level 0.3-0.7 units/ml  INR 2-3 Monitor platelets by anticoagulation protocol: Yes   Plan:  Continue heparin IV at 650 units/hr Heparin level pending at 1400 Warfarin 4mg  PO x 1 dose Monitor daily heparin level/INR/CBC, s/sx bleeding   Elicia Lamp, PharmD, BCPS Please check AMION for all [place] contact numbers Clinical Pharmacist 01/25/2019 11:14 AM

## 2019-01-25 NOTE — Progress Notes (Signed)
[place] for Heparin + Warfarin Indication: atrial fibrillation  Allergies  Allergen Reactions  . Cyclobenzaprine Anaphylaxis and Other (See Comments)    "stopped heart"   . Morphine Anaphylaxis    "stopped heart"  . Penicillins Shortness Of Breath, Swelling and Palpitations    Did it involve swelling of the face/tongue/throat, SOB, or low BP? Yes Did it involve sudden or severe rash/hives, skin peeling, or any reaction on the inside of your mouth or nose? No Did you need to seek medical attention at a hospital or doctor's office? No When did it last happen?years  If all above answers are "NO", may proceed with cephalosporin use.   Marland Kitchen Hydromorphone Other (See Comments)    If administered quickly, felt like hand was "exploding"   . Codeine Itching and Rash    Patient Measurements: Height: 5\' 2"  (157.5 cm) Weight: 108 lb 0.4 oz (49 kg) IBW/kg (Calculated) : 50.1  Heparin Dosing Wt: 49 kg  Vital Signs: Temp: 97.7 F (36.5 C) (12/29 1520) Temp Source: Oral (12/29 1520) BP: 170/70 (12/29 1520) Pulse Rate: 71 (12/29 1642)  Labs: Recent Labs    01/24/19 2228 01/25/19 0013 01/25/19 0537 01/25/19 0722 01/25/19 1614  HGB 12.0  --   --   --   --   HCT 37.7  --   --   --   --   PLT 174  --   --   --   --   LABPROT  --   --   --   --  12.7  INR  --   --   --   --  1.0  HEPARINUNFRC  --   --   --   --  0.28*  CREATININE 7.77*  --   --   --   --   TROPONINIHS 27* 28* 27* 25*  --     Estimated Creatinine Clearance: 5.4 mL/min (A) (by C-G formula based on SCr of 7.77 mg/dL (H)).   Medical History: Past Medical History:  Diagnosis Date  . Anemia   . Anxiety   . Arthritis   . Asthma   . Atrial fibrillation ([place])   . Chronic kidney disease    Dialysis T/Th/Sa  started in March 2020  . COPD (chronic obstructive pulmonary disease) ([place])   . Coronary artery disease    2 stents  . Depression   . GERD (gastroesophageal reflux  disease)   . Gout   . Headache    migraines  . History of kidney stones   . Hyperlipidemia   . Hypertension   . Pneumonia   . Small bowel obstruction [facility])     Assessment: 68 yr old female with ESRD on HD presented with SOB - found to be in afib, continuing on heparin for afib. Pt was on no anticoagulants PTA. Pharmacy was consulted to start warfarin. CBC and LFTs WNL. Baseline INR is 1.0. No interacting meds noted.   Heparin level on infusion of 650 units/hr is 0.28 units/ml this afternoon, which is below the goal range for this pt. Per RN, no issues with IV or bleeding observed.  Goal of Therapy:  Heparin level 0.3-0.7 units/ml  INR 2-3 Monitor platelets by anticoagulation protocol: Yes   Plan:  Increase heparin infusion to 750 units/hr Check 8-hr heparin level Warfarin 4 mg PO x 1 dose this evening Monitor daily heparin level, INR, CBC Monitor for signs/symptoms of bleeding  Gillermina Hu, PharmD, BCPS, [facility] Clinical Pharmacist  01/25/2019 5:20 PM

## 2019-01-25 NOTE — Consult Note (Addendum)
[facility] Renal Consultation Note    Indication for Consultation:  Management of ESRD/hemodialysis; anemia, hypertension/volume and secondary hyperparathyroidism  HPI: Christina [name] is a 68 y.o. female with ESRD on HD TTS as [place]. PMH significant for HTN, CAD s/p 2 stents, hx MI, A fib, b/l carotid artery disease, bipolar disorder, COPD, substance abuse and Hx SBO x3.    She is admitted for management of atrial fibrillation. Presented to ED this am with sudden onset of SOB. Found to be in AFib with RVR. Diltiazem, heparin gtt started in ED. CXR with mild pulm edema. Labs K 3.9, Ca 8.1, BNP 1570, Hgb 12.0 HS troponin flat.   Seen and examined in ED. NSR HR 90s on monitor. Denies SOB or CP at present. Feels much better then she did last night.   Dialyazes TTS via TDC. New LUE AVF maturing. Occasionally misses treatments.  Last dialysis 12/24.   Past Medical History:  Diagnosis Date  . Anemia   . Anxiety   . Arthritis   . Asthma   . Atrial fibrillation ([place])   . Chronic kidney disease    Dialysis T/Th/Sa  started in March 2020  . COPD (chronic obstructive pulmonary disease) ([place])   . Coronary artery disease    2 stents  . Depression   . GERD (gastroesophageal reflux disease)   . Gout   . Headache    migraines  . History of kidney stones   . Hyperlipidemia   . Hypertension   . Pneumonia   . Small bowel obstruction [facility])    Past Surgical History:  Procedure Laterality Date  . ABDOMINAL HYSTERECTOMY    . ABDOMINAL SURGERY     for small bowel obstruction - x 2  . APPENDECTOMY    . AV FISTULA PLACEMENT Left 08/04/2018   Procedure: ARTERIOVENOUS (AV) FISTULA CREATION LEFT ARM;  Surgeon: Waynetta Sandy, MD;  Location: [place];  Service: Vascular;  Laterality: Left;  . BASCILIC VEIN TRANSPOSITION Left 11/24/2018   Procedure: SECOND STAGE BASILIC VEIN TRANSPOSITION LEFT ARM;  Surgeon: Waynetta Sandy, MD;  Location: [place];   Service: Vascular;  Laterality: Left;  . CARDIAC CATHETERIZATION    . CORONARY ANGIOPLASTY  ?2003/2004  . FACIAL RECONSTRUCTION SURGERY     x 2  . HERNIA REPAIR     No family history on file. Social History:  reports that she has quit smoking. She has never used smokeless tobacco. She reports previous alcohol use. She reports previous drug use. Drug: Marijuana. Allergies  Allergen Reactions  . Cyclobenzaprine Anaphylaxis and Other (See Comments)    "stopped heart"   . Morphine Anaphylaxis    "stopped heart"  . Penicillins Shortness Of Breath, Swelling and Palpitations    Did it involve swelling of the face/tongue/throat, SOB, or low BP? Yes Did it involve sudden or severe rash/hives, skin peeling, or any reaction on the inside of your mouth or nose? No Did you need to seek medical attention at a hospital or doctor's office? No When did it last happen?years  If all above answers are "NO", may proceed with cephalosporin use.   Marland Kitchen Hydromorphone Other (See Comments)    If administered quickly, felt like hand was "exploding"   . Codeine Itching and Rash   Prior to Admission medications   Medication Sig Start Date End Date Taking? Authorizing Provider  albuterol (PROVENTIL) (2.5 MG/3ML) 0.083% nebulizer solution Take 2.5 mg by nebulization every 6 (six) hours as needed for  wheezing or shortness of breath.   Yes [provider]  allopurinol (ZYLOPRIM) 100 MG tablet Take 100 mg by mouth daily. 02/18/18  Yes [provider]  aspirin EC 81 MG tablet Take 81 mg by mouth daily.   Yes [provider]  AURYXIA 1 GM 210 MG(Fe) tablet Take 420 mg by mouth See admin instructions. Take 3 tablets (630 mg) by mouth with each meal & take 2 tablets (420 mg) by mouth with each snack 08/16/18  Yes [provider]  Carboxymethylcellul-Glycerin (LUBRICATING EYE DROPS OP) Place 1 drop into both eyes daily as needed (dry eyes).   Yes [provider]   Cholecalciferol (VITAMIN D3) 50 MCG (2000 UT) capsule Take 2,000 Units by mouth daily.    Yes [provider]  dicyclomine (BENTYL) 10 MG capsule Take 20 mg by mouth at bedtime.  08/27/17  Yes [provider]  diphenhydrAMINE (BENADRYL) 25 MG tablet Take 25 mg by mouth daily as needed for allergies.   Yes [provider]  docusate sodium (COLACE) 100 MG capsule Take 1 capsule (100 mg total) by mouth 2 (two) times daily. 08/11/18  Yes Hongalgi, Lenis Dickinson, MD  doxylamine, Sleep, (UNISOM) 25 MG tablet Take 25 mg by mouth at bedtime as needed for sleep.   Yes [provider]  furosemide (LASIX) 80 MG tablet Take 80 mg by mouth See admin instructions. Take one tablet on TUE THUR SAT before dialysis. 10/21/18  Yes [provider]  gabapentin (NEURONTIN) 300 MG capsule Take 600 mg by mouth at bedtime as needed (leg pain.).  08/17/18  Yes [provider]  Ipratropium-Albuterol (COMBIVENT) 20-100 MCG/ACT AERS respimat Inhale 2 puffs into the lungs 4 (four) times daily as needed for wheezing. 09/11/17  Yes [provider]  isosorbide mononitrate (IMDUR) 30 MG 24 hr tablet Take 30 mg by mouth daily. 08/17/18  Yes [provider]  labetalol (NORMODYNE) 200 MG tablet Take 200 mg by mouth 2 (two) times daily. 03/03/18  Yes [provider]  lidocaine-prilocaine (EMLA) cream Apply 1 application topically daily as needed (1-2 hours before dialysis (cover with occlusive dressing)).  09/03/18  Yes [provider]  nitroGLYCERIN (NITROSTAT) 0.4 MG SL tablet Place 0.4 mg under the tongue every 5 (five) minutes as needed for chest pain.  09/18/16  Yes [provider]  ondansetron (ZOFRAN) 4 MG tablet Take 4 mg by mouth 3 (three) times daily as needed for nausea. 09/14/18  Yes [provider]  pantoprazole (PROTONIX) 40 MG tablet Take 1 tablet (40 mg total) by mouth daily. Patient taking differently: Take 40 mg by mouth daily as  needed (acid reflux/indigestion.).  08/11/18  Yes Hongalgi, Lenis Dickinson, MD  polyethylene glycol (MIRALAX / GLYCOLAX) 17 g packet Take 17 g by mouth 2 (two) times daily. Patient taking differently: Take 17 g by mouth every other day.  08/11/18  Yes Hongalgi, Lenis Dickinson, MD  pravastatin (PRAVACHOL) 80 MG tablet Take 80 mg by mouth every evening.  06/30/16  Yes [provider]  sennosides-docusate sodium (SENOKOT-S) 8.6-50 MG tablet Take 1 tablet by mouth 2 (two) times daily.    Yes [provider]  SYMBICORT 160-4.5 MCG/ACT inhaler Inhale 2 puffs into the lungs every 4 (four) hours as needed for shortness of breath or wheezing. 09/16/18  Yes [provider]  oxyCODONE-acetaminophen (PERCOCET) 5-325 MG tablet Take 1 tablet by mouth every 4 (four) hours as needed for up to 20 doses for severe pain.  Patient not taking: Reported on 01/25/2019 11/24/18   Waynetta Sandy, MD   Current Facility-Administered Medications  Medication Dose Route Frequency Provider Last Rate Last Admin  . 0.9 %  sodium chloride infusion  250 mL Intravenous PRN Derrill Kay A, MD      . allopurinol (ZYLOPRIM) tablet 100 mg  100 mg Oral Daily Debbe Odea, MD   100 mg at 01/25/19 1255  . [START ON 01/26/2019] Chlorhexidine Gluconate Cloth 2 % PADS 6 each  6 each Topical Q0600 Ejigiri, Thomos Lemons, PA-C      . fluticasone furoate-vilanterol (BREO ELLIPTA) 200-25 MCG/INH 1 puff  1 puff Inhalation Daily Debbe Odea, MD   1 puff at 01/25/19 1256  . gabapentin (NEURONTIN) capsule 600 mg  600 mg Oral QHS PRN Rizwan, Eunice Blase, MD      . heparin ADULT infusion 100 units/mL (25000 units/262mL sodium chloride 0.45%)  650 Units/hr Intravenous Continuous Franky Macho, RPH 6.5 mL/hr at 01/25/19 0544 650 Units/hr at 01/25/19 0544  . metoprolol tartrate (LOPRESSOR) tablet 100 mg  100 mg Oral BID Debbe Odea, MD   100 mg at 01/25/19 1255  . sodium chloride flush (NS) 0.9 % injection 3 mL  3 mL Intravenous Q12H  David, Rachal A, MD      . sodium chloride flush (NS) 0.9 % injection 3 mL  3 mL Intravenous PRN Derrill Kay A, MD      . warfarin (COUMADIN) tablet 4 mg  4 mg Oral ONCE-1800 Romona Curls, [facility]      . Warfarin - Pharmacist Dosing Inpatient   Does not apply q1800 Romona Curls, [facility]       Current Outpatient Medications  Medication Sig Dispense Refill  . albuterol (PROVENTIL) (2.5 MG/3ML) 0.083% nebulizer solution Take 2.5 mg by nebulization every 6 (six) hours as needed for wheezing or shortness of breath.    . allopurinol (ZYLOPRIM) 100 MG tablet Take 100 mg by mouth daily.    Marland Kitchen aspirin EC 81 MG tablet Take 81 mg by mouth daily.    Lorin Picket 1 GM 210 MG(Fe) tablet Take 420 mg by mouth See admin instructions. Take 3 tablets (630 mg) by mouth with each meal & take 2 tablets (420 mg) by mouth with each snack    . Carboxymethylcellul-Glycerin (LUBRICATING EYE DROPS OP) Place 1 drop into both eyes daily as needed (dry eyes).    . Cholecalciferol (VITAMIN D3) 50 MCG (2000 UT) capsule Take 2,000 Units by mouth daily.     Marland Kitchen dicyclomine (BENTYL) 10 MG capsule Take 20 mg by mouth at bedtime.     . diphenhydrAMINE (BENADRYL) 25 MG tablet Take 25 mg by mouth daily as needed for allergies.    Marland Kitchen docusate sodium (COLACE) 100 MG capsule Take 1 capsule (100 mg total) by mouth 2 (two) times daily. 30 capsule 0  . doxylamine, Sleep, (UNISOM) 25 MG tablet Take 25 mg by mouth at bedtime as needed for sleep.    . furosemide (LASIX) 80 MG tablet Take 80 mg by mouth See admin instructions. Take one tablet on TUE THUR SAT before dialysis.    Marland Kitchen gabapentin (NEURONTIN) 300 MG capsule Take 600 mg by mouth at bedtime as needed (leg pain.).     Marland Kitchen Ipratropium-Albuterol (COMBIVENT) 20-100 MCG/ACT AERS respimat Inhale 2 puffs into the lungs 4 (four) times daily as needed for wheezing.    . isosorbide mononitrate (IMDUR) 30 MG 24 hr tablet Take 30 mg by mouth daily.    Marland Kitchen  labetalol (NORMODYNE) 200 MG tablet Take 200 mg by mouth  2 (two) times daily.    Marland Kitchen lidocaine-prilocaine (EMLA) cream Apply 1 application topically daily as needed (1-2 hours before dialysis (cover with occlusive dressing)).     . nitroGLYCERIN (NITROSTAT) 0.4 MG SL tablet Place 0.4 mg under the tongue every 5 (five) minutes as needed for chest pain.     Marland Kitchen ondansetron (ZOFRAN) 4 MG tablet Take 4 mg by mouth 3 (three) times daily as needed for nausea.    . pantoprazole (PROTONIX) 40 MG tablet Take 1 tablet (40 mg total) by mouth daily. (Patient taking differently: Take 40 mg by mouth daily as needed (acid reflux/indigestion.). )    . polyethylene glycol (MIRALAX / GLYCOLAX) 17 g packet Take 17 g by mouth 2 (two) times daily. (Patient taking differently: Take 17 g by mouth every other day. )    . pravastatin (PRAVACHOL) 80 MG tablet Take 80 mg by mouth every evening.     . sennosides-docusate sodium (SENOKOT-S) 8.6-50 MG tablet Take 1 tablet by mouth 2 (two) times daily.     . SYMBICORT 160-4.5 MCG/ACT inhaler Inhale 2 puffs into the lungs every 4 (four) hours as needed for shortness of breath or wheezing.    Marland Kitchen oxyCODONE-acetaminophen (PERCOCET) 5-325 MG tablet Take 1 tablet by mouth every 4 (four) hours as needed for up to 20 doses for severe pain. (Patient not taking: Reported on 01/25/2019) 20 tablet 0     ROS: As per HPI otherwise negative.  Physical Exam: Vitals:   01/25/19 0759 01/25/19 1200 01/25/19 1245 01/25/19 1252  BP: 138/66 (!) 146/64  (!) 161/70  Pulse: 75 80 90 85  Resp: 19 19 (!) 21 17  Temp:      TempSrc:      SpO2: 98% 100% 97% 100%     General: Alert sitting up in bed, very animated, nad  Head: NCAT sclera not icteric  Neck: Supple. No JVD No Lungs: bilateral wheezing throughout  Heart: RRR with S1 S2 Abdomen: bs+ soft, non-tender  Lower extremities:without edema or ischemic changes, no open wounds  Neuro: A & O  X 3. Moves all extremities spontaneously. Psych:  Responds to questions appropriately with a normal  affect. Dialysis Access: R IJ TDC in place: LUE AVF +bruit   Labs: Basic Metabolic Panel: Recent Labs  Lab 01/24/19 2228  NA 138  K 3.9  CL 104  CO2 20*  GLUCOSE 120*  BUN 55*  CREATININE 7.77*  CALCIUM 8.1*   Liver Function Tests: Recent Labs  Lab 01/24/19 2228  AST 15  ALT 11  ALKPHOS 59  BILITOT 1.3*  PROT 6.3*  ALBUMIN 3.4*   No results for input(s): LIPASE, AMYLASE in the last 168 hours. No results for input(s): AMMONIA in the last 168 hours. CBC: Recent Labs  Lab 01/24/19 2228  WBC 8.1  NEUTROABS 6.1  HGB 12.0  HCT 37.7  MCV 95.0  PLT 174   Cardiac Enzymes: No results for input(s): CKTOTAL, CKMB, CKMBINDEX, TROPONINI in the last 168 hours. CBG: Recent Labs  Lab 01/24/19 2203  GLUCAP 79   Iron Studies: No results for input(s): IRON, TIBC, TRANSFERRIN, FERRITIN in the last 72 hours. Studies/Results: DG Chest 2 View  Result Date: 01/24/2019 CLINICAL DATA:  Shortness of breath EXAM: CHEST - 2 VIEW COMPARISON:  10/13/2018 FINDINGS: Right-sided central venous catheter tip at the cavoatrial junction. Trace bilateral pleural effusions. Pulmonary hyperinflation. Mild diffuse bilateral interstitial and ground-glass opacity. Stable  cardiomediastinal silhouette with aortic atherosclerosis. No pneumothorax. IMPRESSION: Trace bilateral pleural effusions with mild diffuse interstitial and ground-glass opacity, suspect for mild edema. Electronically Signed   By: Donavan Foil M.D.   On: 01/24/2019 22:59   ECHOCARDIOGRAM COMPLETE  Result Date: 01/25/2019   ECHOCARDIOGRAM REPORT   Patient Name:   Christina [name] Date of Exam: 01/25/2019 Medical Rec #:  [id number]          Height:       61.0 in Accession #:    [id number]         Weight:       104.0 lb Date of Birth:  07/12/1950           BSA:          1.43 m Patient Age:    40 years           BP:           138/66 mmHg Patient Gender: F                  HR:           74 bpm. Exam Location:  Inpatient Procedure: 2D Echo,  Color Doppler and Cardiac Doppler Indications:    R94.31 Abnormal EKG  History:        Patient has no prior history of Echocardiogram examinations.                 CAD, COPD, Arrythmias:Atrial Fibrillation; Risk                 Factors:Hypertension and Dyslipidemia. ESRD.  Sonographer:    Raquel Sarna Senior RDCS Referring Phys: [place]  1. Left ventricular ejection fraction, by visual estimation, is 45 to 50%. The left ventricle has mildly decreased function. Left ventricular septal wall thickness was mildly increased. There is no left ventricular hypertrophy.  2. Left ventricular diastolic parameters are consistent with Grade I diastolic dysfunction (impaired relaxation).  3. The left ventricle demonstrates global hypokinesis.  4. Elevated LVEDP.  5. Global right ventricle has normal systolic function.The right ventricular size is normal. No increase in right ventricular wall thickness.  6. Left atrial size was moderately dilated.  7. Right atrial size was normal.  8. Mild mitral annular calcification.  9. The mitral valve is normal in structure. Mild to moderate mitral valve regurgitation. No evidence of mitral stenosis. 10. The tricuspid valve is normal in structure. 11. The aortic valve is normal in structure. Aortic valve regurgitation is mild. No evidence of aortic valve sclerosis or stenosis. 12. The pulmonic valve was normal in structure. Pulmonic valve regurgitation is not visualized. 13. Mildly elevated pulmonary artery systolic pressure. 14. The inferior vena cava is normal in size with greater than 50% respiratory variability, suggesting right atrial pressure of 3 mmHg. FINDINGS  Left Ventricle: Left ventricular ejection fraction, by visual estimation, is 45 to 50%. The left ventricle has mildly decreased function. The left ventricle demonstrates global hypokinesis. There is no left ventricular hypertrophy. Left ventricular diastolic parameters are consistent with Grade I diastolic  dysfunction (impaired relaxation). Normal left atrial pressure. Elevated LVEDP. Right Ventricle: The right ventricular size is normal. No increase in right ventricular wall thickness. Global RV systolic function is has normal systolic function. The tricuspid regurgitant velocity is 2.85 m/s, and with an assumed right atrial pressure  of 3 mmHg, the estimated right ventricular systolic pressure is mildly elevated at 35.5 mmHg. Left Atrium: Left atrial size was  moderately dilated. Right Atrium: Right atrial size was normal in size Pericardium: There is no evidence of pericardial effusion. Mitral Valve: The mitral valve is normal in structure. There is mild thickening of the mitral valve leaflet(s). Mild mitral annular calcification. Mild to moderate mitral valve regurgitation. No evidence of mitral valve stenosis by observation. Tricuspid Valve: The tricuspid valve is normal in structure. Tricuspid valve regurgitation is mild. Aortic Valve: The aortic valve is normal in structure. Aortic valve regurgitation is mild. The aortic valve is structurally normal, with no evidence of sclerosis or stenosis. Pulmonic Valve: The pulmonic valve was normal in structure. Pulmonic valve regurgitation is not visualized. Pulmonic regurgitation is not visualized. Aorta: The aortic root, ascending aorta and aortic arch are all structurally normal, with no evidence of dilitation or obstruction. Venous: The right upper and right lower pulmonary veins are and the left upper and right lower pulmonary veins are normal. The inferior vena cava is normal in size with greater than 50% respiratory variability, suggesting right atrial pressure of 3 mmHg. IAS/Shunts: No atrial level shunt detected by color flow Doppler. There is no evidence of a patent foramen ovale. No ventricular septal defect is seen or detected. There is no evidence of an atrial septal defect.  LEFT VENTRICLE PLAX 2D LVIDd:         4.10 cm       Diastology LVIDs:         3.20  cm       LV e' lateral:   7.94 cm/s LV PW:         1.00 cm       LV E/e' lateral: 14.4 LV IVS:        1.00 cm       LV e' medial:    6.85 cm/s LVOT diam:     1.60 cm       LV E/e' medial:  16.6 LV SV:         33 ml LV SV Index:   23.37 LVOT Area:     2.01 cm  LV Volumes (MOD) LV area d, A2C:    29.70 cm LV area d, A4C:    28.50 cm LV area s, A2C:    19.20 cm LV area s, A4C:    20.20 cm LV major d, A2C:   8.52 cm LV major d, A4C:   8.13 cm LV major s, A2C:   7.45 cm LV major s, A4C:   7.51 cm LV vol d, MOD A2C: 86.0 ml LV vol d, MOD A4C: 80.5 ml LV vol s, MOD A2C: 43.3 ml LV vol s, MOD A4C: 46.8 ml LV SV MOD A2C:     42.7 ml LV SV MOD A4C:     80.5 ml LV SV MOD BP:      40.1 ml RIGHT VENTRICLE RV S prime:     10.00 cm/s TAPSE (M-mode): 1.7 cm LEFT ATRIUM             Index       RIGHT ATRIUM           Index LA diam:        4.20 cm 2.94 cm/m  RA Area:     13.70 cm LA Vol (A2C):   55.0 ml 38.45 ml/m RA Volume:   32.50 ml  22.72 ml/m LA Vol (A4C):   49.9 ml 34.88 ml/m LA Biplane Vol: 54.2 ml 37.89 ml/m  AORTIC VALVE LVOT Vmax:   130.00 cm/s LVOT  Vmean:  86.000 cm/s LVOT VTI:    0.298 m  AORTA Ao Root diam: 2.60 cm Ao Asc diam:  2.60 cm MITRAL VALVE                         TRICUSPID VALVE MV Area (PHT): 3.37 cm              TR Peak grad:   32.5 mmHg MV PHT:        65.25 msec            TR Vmax:        285.00 cm/s MV Decel Time: 225 msec MV E velocity: 114.00 cm/s 103 cm/s  SHUNTS MV A velocity: 123.00 cm/s 70.3 cm/s Systemic VTI:  0.30 m MV E/A ratio:  0.93        1.5       Systemic Diam: 1.60 cm  Skeet Latch MD Electronically signed by Skeet Latch MD Signature Date/Time: 01/25/2019/2:15:04 PM    Final      Dialysis Orders:  [facility] TTS 4h 400/A1.5x EDW 47kg 2K/2Ca UFP 4  TDC No heparin  Calcitriol 0.75 TIW   Assessment/Plan: 1.  AFib with RVR. Cardizem started/ IV heparin/warfarin bridge. Doesn't appear was on anticoagulation PTA. Per primary  2.  ESRD -  HD TTS. For HD today on schedule.   3.  Hypertension/volume  - BP elevated. Mild edema on CXR. UF 2.5 -3L as tolerated  4.  Anemia  - Hgb >12. No ESA needs.  5.  Metabolic bone disease -  Continue binders/VDRA  6. COPD   Lynnda Child PA-C [facility] Pager [phone] 01/25/2019, 2:39 PM   Pt seen, examined and agree w A/P as above.  Kelly Splinter  MD 01/25/2019, 5:45 PM

## 2019-01-25 NOTE — Progress Notes (Signed)
ANTICOAGULATION CONSULT NOTE - Initial Consult  Pharmacy Consult for Heparin Indication: atrial fibrillation  Allergies  Allergen Reactions  . Cyclobenzaprine Anaphylaxis and Other (See Comments)    "stopped heart"   . Morphine Anaphylaxis    "stopped heart"  . Penicillins Shortness Of Breath, Swelling and Palpitations    Did it involve swelling of the face/tongue/throat, SOB, or low BP? Yes Did it involve sudden or severe rash/hives, skin peeling, or any reaction on the inside of your mouth or nose? No Did you need to seek medical attention at a hospital or doctor's office? No When did it last happen?years  If all above answers are "NO", may proceed with cephalosporin use.   Marland Kitchen Hydromorphone Other (See Comments)    If administered quickly, felt like hand was "exploding"   . Codeine Itching and Rash    Patient Measurements:    Ht 61 in   Wt ~45 kg   Vital Signs: Temp: 99.7 F (37.6 C) (12/29 0214) Temp Source: Oral (12/29 0214) BP: 137/74 (12/29 0515) Pulse Rate: 113 (12/29 0515)  Labs: Recent Labs    01/24/19 2228 01/25/19 0013  HGB 12.0  --   HCT 37.7  --   PLT 174  --   CREATININE 7.77*  --   TROPONINIHS 27* 28*    CrCl cannot be calculated (Unknown ideal weight.).   Medical History: Past Medical History:  Diagnosis Date  . Anemia   . Anxiety   . Arthritis   . Asthma   . Atrial fibrillation ([place])   . Chronic kidney disease    Dialysis T/Th/Sa  started in March 2020  . COPD (chronic obstructive pulmonary disease) ([place])   . Coronary artery disease    2 stents  . Depression   . GERD (gastroesophageal reflux disease)   . Gout   . Headache    migraines  . History of kidney stones   . Hyperlipidemia   . Hypertension   . Pneumonia   . Small bowel obstruction ([facility])     Medications:  See electronic med rec  Assessment: 68 y.o. F presents with SOB - found to be in afib. To begin heparin for afib. Pt with ESRD on HD. No AC PTA. CBC stable  on admission.  Goal of Therapy:  Heparin level 0.3-0.7 units/ml Monitor platelets by anticoagulation protocol: Yes   Plan:  Heparin IV bolus 3000 units Heparin gtt at 650 units/hr Will f/u heparin level in 8 hours Daily heparin level and CBC  Sherlon Handing, PharmD, BCPS Please see amion for complete clinical pharmacist phone list 01/25/2019,5:23 AM

## 2019-01-25 NOTE — H&P (Signed)
History and Physical    Christina [name] BEM:[id number] DOB: 04/22/1950 DOA: 01/24/2019  PCP: Maggie Schwalbe, PA-C  Patient coming from: Home  Chief Complaint: Shortness of breath  HPI: Christina [name] is a 68 y.o. female with medical history significant of end-stage renal disease dialysis Tuesday Thursday Saturday, COPD, A. fib, comes in with shortness of breath progressively worsened through the night.  Patient reports she been having problems with her heart rate she gets short of breath and her heart rate goes up.  She says she is compliant with her medications.  She denies any chest pain urinary lower extremity edema.  She denies any cough or fevers.  She been feeling fine except for the shortness of breath.  She denies any palpitations.  She does not require home oxygen she is currently on 2 L for an O2 sat of 84% on room air earlier.  Patient be referred for admission for A. fib with RVR rate was in the 140s currently on a diltiazem drip.  Covid testing is pending.  Review of Systems: As per HPI otherwise 10 point review of systems negative.   Past Medical History:  Diagnosis Date  . Anemia   . Anxiety   . Arthritis   . Asthma   . Atrial fibrillation ([place])   . Chronic kidney disease    Dialysis T/Th/Sa  started in March 2020  . COPD (chronic obstructive pulmonary disease) ([place])   . Coronary artery disease    2 stents  . Depression   . GERD (gastroesophageal reflux disease)   . Gout   . Headache    migraines  . History of kidney stones   . Hyperlipidemia   . Hypertension   . Pneumonia   . Small bowel obstruction [facility])     Past Surgical History:  Procedure Laterality Date  . ABDOMINAL HYSTERECTOMY    . ABDOMINAL SURGERY     for small bowel obstruction - x 2  . APPENDECTOMY    . AV FISTULA PLACEMENT Left 08/04/2018   Procedure: ARTERIOVENOUS (AV) FISTULA CREATION LEFT ARM;  Surgeon: Waynetta Sandy, MD;  Location: [place];  Service: Vascular;  Laterality:  Left;  . BASCILIC VEIN TRANSPOSITION Left 11/24/2018   Procedure: SECOND STAGE BASILIC VEIN TRANSPOSITION LEFT ARM;  Surgeon: Waynetta Sandy, MD;  Location: [place];  Service: Vascular;  Laterality: Left;  . CARDIAC CATHETERIZATION    . CORONARY ANGIOPLASTY  ?2003/2004  . FACIAL RECONSTRUCTION SURGERY     x 2  . HERNIA REPAIR       reports that she has quit smoking. She has never used smokeless tobacco. She reports previous alcohol use. She reports previous drug use. Drug: Marijuana.  Allergies  Allergen Reactions  . Cyclobenzaprine Anaphylaxis and Other (See Comments)    "stopped heart"   . Morphine Anaphylaxis    "stopped heart"  . Penicillins Shortness Of Breath, Swelling and Palpitations    Did it involve swelling of the face/tongue/throat, SOB, or low BP? Yes Did it involve sudden or severe rash/hives, skin peeling, or any reaction on the inside of your mouth or nose? No Did you need to seek medical attention at a hospital or doctor's office? No When did it last happen?years  If all above answers are "NO", may proceed with cephalosporin use.   Marland Kitchen Hydromorphone Other (See Comments)    If administered quickly, felt like hand was "exploding"   . Codeine Itching and Rash    No family history  on file.  No premature coronary artery disease  Prior to Admission medications   Medication Sig Start Date End Date Taking? Authorizing Provider  albuterol (PROVENTIL) (2.5 MG/3ML) 0.083% nebulizer solution Take 2.5 mg by nebulization every 6 (six) hours as needed for wheezing or shortness of breath.   Yes [provider]  allopurinol (ZYLOPRIM) 100 MG tablet Take 100 mg by mouth daily. 02/18/18  Yes [provider]  aspirin EC 81 MG tablet Take 81 mg by mouth daily.   Yes [provider]  AURYXIA 1 GM 210 MG(Fe) tablet Take 420 mg by mouth See admin instructions. Take 3 tablets (630 mg) by mouth with each meal & take 2 tablets (420 mg) by mouth with  each snack 08/16/18  Yes [provider]  Carboxymethylcellul-Glycerin (LUBRICATING EYE DROPS OP) Place 1 drop into both eyes daily as needed (dry eyes).   Yes [provider]  Cholecalciferol (VITAMIN D3) 50 MCG (2000 UT) capsule Take 2,000 Units by mouth daily.    Yes [provider]  dicyclomine (BENTYL) 10 MG capsule Take 20 mg by mouth at bedtime.  08/27/17  Yes [provider]  diphenhydrAMINE (BENADRYL) 25 MG tablet Take 25 mg by mouth daily as needed for allergies.   Yes [provider]  docusate sodium (COLACE) 100 MG capsule Take 1 capsule (100 mg total) by mouth 2 (two) times daily. 08/11/18  Yes Hongalgi, Lenis Dickinson, MD  doxylamine, Sleep, (UNISOM) 25 MG tablet Take 25 mg by mouth at bedtime as needed for sleep.   Yes [provider]  furosemide (LASIX) 80 MG tablet Take 80 mg by mouth See admin instructions. Take one tablet on TUE THUR SAT before dialysis. 10/21/18  Yes [provider]  gabapentin (NEURONTIN) 300 MG capsule Take 600 mg by mouth at bedtime as needed (leg pain.).  08/17/18  Yes [provider]  Ipratropium-Albuterol (COMBIVENT) 20-100 MCG/ACT AERS respimat Inhale 2 puffs into the lungs 4 (four) times daily as needed for wheezing. 09/11/17  Yes [provider]  isosorbide mononitrate (IMDUR) 30 MG 24 hr tablet Take 30 mg by mouth daily. 08/17/18  Yes [provider]  labetalol (NORMODYNE) 200 MG tablet Take 200 mg by mouth 2 (two) times daily. 03/03/18  Yes [provider]  lidocaine-prilocaine (EMLA) cream Apply 1 application topically daily as needed (1-2 hours before dialysis (cover with occlusive dressing)).  09/03/18  Yes [provider]  nitroGLYCERIN (NITROSTAT) 0.4 MG SL tablet Place 0.4 mg under the tongue every 5 (five) minutes as needed for chest pain.  09/18/16  Yes [provider]  ondansetron (ZOFRAN) 4 MG tablet Take 4 mg by mouth 3 (three) times daily as needed  for nausea. 09/14/18  Yes [provider]  pantoprazole (PROTONIX) 40 MG tablet Take 1 tablet (40 mg total) by mouth daily. Patient taking differently: Take 40 mg by mouth daily as needed (acid reflux/indigestion.).  08/11/18  Yes Hongalgi, Lenis Dickinson, MD  polyethylene glycol (MIRALAX / GLYCOLAX) 17 g packet Take 17 g by mouth 2 (two) times daily. Patient taking differently: Take 17 g by mouth every other day.  08/11/18  Yes Hongalgi, Lenis Dickinson, MD  pravastatin (PRAVACHOL) 80 MG tablet Take 80 mg by mouth every evening.  06/30/16  Yes [provider]  sennosides-docusate sodium (SENOKOT-S) 8.6-50 MG tablet Take 1 tablet by mouth 2 (two) times daily.    Yes [provider]  SYMBICORT 160-4.5 MCG/ACT inhaler Inhale 2 puffs into the  lungs every 4 (four) hours as needed for shortness of breath or wheezing. 09/16/18  Yes [provider]  oxyCODONE-acetaminophen (PERCOCET) 5-325 MG tablet Take 1 tablet by mouth every 4 (four) hours as needed for up to 20 doses for severe pain. Patient not taking: Reported on 01/25/2019 11/24/18   Waynetta Sandy, MD    Physical Exam: Vitals:   01/25/19 0430 01/25/19 0445 01/25/19 0500 01/25/19 0515  BP: (!) 146/90 (!) 142/93 (!) 118/53 137/74  Pulse: (!) 103 (!) 139 (!) 107 (!) 113  Resp: (!) 28 (!) 22 (!) 24 19  Temp:      TempSrc:      SpO2: 96% 96% 93% 96%      Constitutional: NAD, calm, comfortable Vitals:   01/25/19 0430 01/25/19 0445 01/25/19 0500 01/25/19 0515  BP: (!) 146/90 (!) 142/93 (!) 118/53 137/74  Pulse: (!) 103 (!) 139 (!) 107 (!) 113  Resp: (!) 28 (!) 22 (!) 24 19  Temp:      TempSrc:      SpO2: 96% 96% 93% 96%   Eyes: PERRL, lids and conjunctivae normal ENMT: Mucous membranes are moist. Posterior pharynx clear of any exudate or lesions.Normal dentition.  Neck: normal, supple, no masses, no thyromegaly Respiratory: clear to auscultation bilaterally, no wheezing, no crackles. Normal respiratory  effort. No accessory muscle use.  Cardiovascular: irregular rate and rhythm, no murmurs / rubs / gallops. No extremity edema. 2+ pedal pulses. No carotid bruits.  Abdomen: no tenderness, no masses palpated. No hepatosplenomegaly. Bowel sounds positive.  Musculoskeletal: no clubbing / cyanosis. No joint deformity upper and lower extremities. Good ROM, no contractures. Normal muscle tone.  Skin: no rashes, lesions, ulcers. No induration Neurologic: CN 2-12 grossly intact. Sensation intact, DTR normal. Strength 5/5 in all 4.  Psychiatric: Normal judgment and insight. Alert and oriented x 3. Normal mood.    Labs on Admission: I have personally reviewed following labs and imaging studies  CBC: Recent Labs  Lab 01/24/19 2228  WBC 8.1  NEUTROABS 6.1  HGB 12.0  HCT 37.7  MCV 95.0  PLT 024   Basic Metabolic Panel: Recent Labs  Lab 01/24/19 2228  NA 138  K 3.9  CL 104  CO2 20*  GLUCOSE 120*  BUN 55*  CREATININE 7.77*  CALCIUM 8.1*   GFR: CrCl cannot be calculated (Unknown ideal weight.). Liver Function Tests: Recent Labs  Lab 01/24/19 2228  AST 15  ALT 11  ALKPHOS 59  BILITOT 1.3*  PROT 6.3*  ALBUMIN 3.4*   No results for input(s): LIPASE, AMYLASE in the last 168 hours. No results for input(s): AMMONIA in the last 168 hours. Coagulation Profile: No results for input(s): INR, PROTIME in the last 168 hours. Cardiac Enzymes: No results for input(s): CKTOTAL, CKMB, CKMBINDEX, TROPONINI in the last 168 hours. BNP (last 3 results) No results for input(s): PROBNP in the last 8760 hours. HbA1C: No results for input(s): HGBA1C in the last 72 hours. CBG: Recent Labs  Lab 01/24/19 2203  GLUCAP 79   Lipid Profile: No results for input(s): CHOL, HDL, LDLCALC, TRIG, CHOLHDL, LDLDIRECT in the last 72 hours. Thyroid Function Tests: No results for input(s): TSH, T4TOTAL, FREET4, T3FREE, THYROIDAB in the last 72 hours. Anemia Panel: No results for input(s): VITAMINB12,  FOLATE, FERRITIN, TIBC, IRON, RETICCTPCT in the last 72 hours. Urine analysis:    Component Value Date/Time   COLORURINE YELLOW 09/15/2018 0751   APPEARANCEUR CLOUDY (A) 09/15/2018 0751   LABSPEC 1.009 09/15/2018 0751  PHURINE 6.0 09/15/2018 0751   GLUCOSEU NEGATIVE 09/15/2018 0751   HGBUR NEGATIVE 09/15/2018 0751   BILIRUBINUR NEGATIVE 09/15/2018 0751   KETONESUR NEGATIVE 09/15/2018 0751   PROTEINUR 100 (A) 09/15/2018 0751   NITRITE NEGATIVE 09/15/2018 0751   LEUKOCYTESUR MODERATE (A) 09/15/2018 0751   Sepsis Labs: !!!!!!!!!!!!!!!!!!!!!!!!!!!!!!!!!!!!!!!!!!!! @LABRCNTIP (procalcitonin:4,lacticidven:4) )No results found for this or any previous visit (from the past 240 hour(s)).   Radiological Exams on Admission: DG Chest 2 View  Result Date: 01/24/2019 CLINICAL DATA:  Shortness of breath EXAM: CHEST - 2 VIEW COMPARISON:  10/13/2018 FINDINGS: Right-sided central venous catheter tip at the cavoatrial junction. Trace bilateral pleural effusions. Pulmonary hyperinflation. Mild diffuse bilateral interstitial and ground-glass opacity. Stable cardiomediastinal silhouette with aortic atherosclerosis. No pneumothorax. IMPRESSION: Trace bilateral pleural effusions with mild diffuse interstitial and ground-glass opacity, suspect for mild edema. Electronically Signed   By: Donavan Foil M.D.   On: 01/24/2019 22:59    EKG: Independently reviewed.  A. fib with RVR Old chart reviewed Case discussed Dr. Dayna Barker Chest x-ray with trace bilateral pleural effusions  Assessment/Plan 68 year old dialysis female patient comes in with A. fib with RVR  Principal Problem:   Atrial fibrillation with RVR (HCC)-continue diltiazem drip.  Placed in progressive unit on cardiac monitoring.  Obtain cardiac echo report.  Serial troponin.  Continue heparin drip.  Active Problems:   ESRD (end stage renal disease) (HCC)-due for dialysis today left message on nephrology voicemail    CAD (coronary artery disease),  native coronary artery-serial troponin    COPD with chronic bronchitis (HCC)-lungs clear    CVA (cerebral vascular accident) (HCC)-history thereof no focal neurologic deficits    Essential hypertension-clarify home meds currently on diltiazem drip   Covid screen pending   DVT prophylaxis: Heparin Code Status: DNR Family Communication: None Disposition Plan: 1 to 2 days Consults called: None, will need nephrology in the morning for routine dialysis left message on voicemail Admission status: Observation   Aleeyah Bensen A MD [facility]  If 7PM-7AM, please contact night-coverage [email or web address] Password [email or web address]  01/25/2019, 5:22 AM

## 2019-01-26 LAB — CBC
HCT: 37.8 % (ref 36.0–46.0)
Hemoglobin: 12.7 g/dL (ref 12.0–15.0)
MCH: 30.4 pg (ref 26.0–34.0)
MCHC: 33.6 g/dL (ref 30.0–36.0)
MCV: 90.4 fL (ref 80.0–100.0)
Platelets: 217 10*3/uL (ref 150–400)
RBC: 4.18 MIL/uL (ref 3.87–5.11)
RDW: 15.2 % (ref 11.5–15.5)
WBC: 8.4 10*3/uL (ref 4.0–10.5)
nRBC: 0 % (ref 0.0–0.2)

## 2019-01-26 LAB — BASIC METABOLIC PANEL
Anion gap: 15 (ref 5–15)
BUN: 14 mg/dL (ref 8–23)
CO2: 24 mmol/L (ref 22–32)
Calcium: 8.6 mg/dL — ABNORMAL LOW (ref 8.9–10.3)
Chloride: 98 mmol/L (ref 98–111)
Creatinine, Ser: 2.84 mg/dL — ABNORMAL HIGH (ref 0.44–1.00)
GFR calc Af Amer: 19 mL/min — ABNORMAL LOW (ref >60)
GFR calc non Af Amer: 16 mL/min — ABNORMAL LOW (ref >60)
Glucose, Bld: 103 mg/dL — ABNORMAL HIGH (ref 70–99)
Potassium: 4.4 mmol/L (ref 3.5–5.1)
Sodium: 137 mmol/L (ref 135–145)

## 2019-01-26 LAB — HEPARIN LEVEL (UNFRACTIONATED): Heparin Unfractionated: 0.42 IU/mL (ref 0.30–0.70)

## 2019-01-26 LAB — PROTIME-INR
INR: 0.9 (ref 0.8–1.2)
Prothrombin Time: 12.2 seconds (ref 11.4–15.2)

## 2019-01-26 MED ORDER — HEPARIN SODIUM (PORCINE) 1000 UNIT/ML IJ SOLN
INTRAMUSCULAR | Status: AC
  Filled 2019-01-26: qty 4

## 2019-01-26 MED ORDER — METOPROLOL TARTRATE 100 MG PO TABS: 100.0000 mg | ORAL_TABLET | Freq: Two times a day (BID) | ORAL | 0 refills | Status: DC

## 2019-01-26 NOTE — Progress Notes (Signed)
ANTICOAGULATION CONSULT NOTE  Pharmacy Consult for Heparin + Warfarin Indication: atrial fibrillation   Assessment: 68 yr old female with ESRD on HD presented with SOB - found to be in afib, continuing on heparin for afib. Pt was on no anticoagulants PTA. Pharmacy was consulted to start warfarin. CBC and LFTs WNL. Baseline INR is 1.0. No interacting meds noted.   Heparin level at goal this am 0.42 units/ml Goal of Therapy:  Heparin level 0.3-0.7 units/ml  INR 2-3 Monitor platelets by anticoagulation protocol: Yes   Plan:  Continue heparin at 750 units/hr Thanks for allowing pharmacy to be a part of this patient's care.  Excell Seltzer, PharmD Clinical Pharmacist  01/26/2019 4:42 AM

## 2019-01-26 NOTE — Progress Notes (Signed)
  [facility] Progress Note   Subjective:  Seen in room. Completed HD overnight. 2.5L UF. Feels ok this am. Denies CP, SOB. NSR on monitor. Says going home today.   Objective Vitals:   01/26/19 0200 01/26/19 0310 01/26/19 0420 01/26/19 0718  BP: 140/76 (!) 160/80 (!) 144/72 127/81  Pulse: 74 72 73 79  Resp: 20 20 (!) 21 18  Temp:  98 F (36.7 C) 98.2 F (36.8 C) 98.2 F (36.8 C)  TempSrc:  Oral Oral Oral  SpO2:  100% 98% 92%  Weight:  47 kg    Height:        Physical Exam General: alert, oriented, nad  Heart: Regular, regular  Lungs: Clear, bilaterally  Abdomen: soft non-tender  Extremities: No LE edema  Dialysis Access: R IJ TDC in place: LUE AVF +bruit    Weight change:    Additional Objective Labs: Basic Metabolic Panel: Recent Labs  Lab 01/24/19 2228 01/26/19 0352  NA 138 137  K 3.9 4.4  CL 104 98  CO2 20* 24  GLUCOSE 120* 103*  BUN 55* 14  CREATININE 7.77* 2.84*  CALCIUM 8.1* 8.6*   CBC: Recent Labs  Lab 01/24/19 2228 01/26/19 0352  WBC 8.1 8.4  NEUTROABS 6.1  --   HGB 12.0 12.7  HCT 37.7 37.8  MCV 95.0 90.4  PLT 174 217   Blood Culture    Component Value Date/Time   SDES URINE, CLEAN CATCH 09/15/2018 0752   SPECREQUEST NONE 09/15/2018 0752   CULT (A) 09/15/2018 0752    >=100,000 COLONIES/mL ESCHERICHIA COLI SUSCEPTIBILITIES TO FOLLOW Performed at [facility] [address], [place], [place] [address]    REPTSTATUS 09/17/2018 FINAL 09/15/2018 0752     Medications: . sodium chloride     . allopurinol  100 mg Oral Daily  . aspirin EC  81 mg Oral Daily  . [START ON 01/27/2019] calcitRIOL  0.75 mcg Oral Q T,Th,Sa-HD  . Chlorhexidine Gluconate Cloth  6 each Topical Q0600  . ferric citrate  630 mg Oral TID WC  . fluticasone  2 spray Each Nare Daily  . fluticasone furoate-vilanterol  1 puff Inhalation Daily  . guaiFENesin  5 mL Oral QID  . isosorbide mononitrate  30 mg Oral Daily  . metoprolol tartrate  100 mg  Oral BID  . pravastatin  80 mg Oral QPM  . senna-docusate  1 tablet Oral BID  . sodium chloride flush  3 mL Intravenous Q12H    Dialysis Orders:  [facility] TTS 4h 400/A1.5x EDW 47kg 2K/2Ca UFP 4  TDC No heparin  Calcitriol 0.75 TIW   Assessment/Plan: 1. AFib with RVR. Converted to NSR with Cardizem gtt. Resume metoprolol at home. No further anticoagulation at this point d/t risk factors. F/u cardiology as outpatient.  2.  ESRD -  HD TTS. Next HD 12/31 at outpatient center.  3.  Hypertension/volume  - BP improved after HD. 2.5L UF and now at dry weight.  4.  Anemia  - Hgb >12. No ESA needs.  5.  Metabolic bone disease -  Continue binders/VDRA  6. COPD   Lynnda Child PA-C [facility] Pager [phone] 01/26/2019,11:15 AM  LOS: 1 day

## 2019-01-26 NOTE — Discharge Summary (Signed)
Physician Discharge Summary  Christina [name] HER:[id number] DOB: [birth year] DOA: 01/24/2019  PCP: Maggie Schwalbe, PA-C  Admit date: 01/24/2019 Discharge date: 01/26/2019  Admitted From: Home Disposition: Home  Recommendations for Outpatient Follow-up:  1. Follow up with PCP in 1-2 weeks 2. Please obtain BMP/CBC in one week  Discharge Condition: Guarded CODE STATUS: DNR Diet recommendation: Renal diet, diabetic diet  Brief/Interim Summary:  Christina [name] is a 68 y.o. female with medical history significant of end-stage renal disease dialysis Tuesday Thursday Saturday, COPD, A. fib, comes in with shortness of breath progressively worsened through the night.  Patient reports she been having problems with her heart rate she gets short of breath and her heart rate goes up.  She says she is compliant with her medications.  She denies any chest pain urinary lower extremity edema.  She denies any cough or fevers.  She been feeling fine except for the shortness of breath.  She denies any palpitations.  She does not require home oxygen she is currently on 2 L for an O2 sat of 84% on room air earlier.  Patient be referred for admission for A. fib with RVR rate was in the 140s currently on a diltiazem drip.  Patient able to transition off of diltiazem drip, A. fib converted back into sinus rhythm overnight on the first night.  Since acute hypoxia likely secondary to rapid A. fib.  Patient indicates she has a transient history of A. fib, paroxysmal appears to be provoked in the past in the setting of illness or surgery.  Patient's CHA2DS2-VASc is 8, however given her paroxysmal A. fib nature would not anticoagulate at this point given patient's risk factors including dialysis as well as frequent falls.  At this point patient remains normal sinus rhythm despite exertion around the room.  Will have patient follow-up with PCP as well as outpatient cardiology in the next week to 2 weeks.  Patient  previously was placed on a Holter monitor in the outpatient setting but was very noncompliant, would recommend repeating this test to see if patient is truly in paroxysmal A. fib and to further evaluate A. fib burden.  Patient otherwise stable and agreeable for discharge home.  Discharge Diagnoses:  Principal Problem:   Atrial fibrillation with RVR ([place]) Active Problems:   ESRD (end stage renal disease) (HCC)   CAD (coronary artery disease), native coronary artery   COPD with chronic bronchitis (HCC)   CVA (cerebral vascular accident) [facility])   Essential hypertension  Discharge Instructions  Discharge Instructions    Call MD for:  difficulty breathing, headache or visual disturbances   Complete by: As directed    Call MD for:  extreme fatigue   Complete by: As directed    Call MD for:  hives   Complete by: As directed    Call MD for:  persistant dizziness or light-headedness   Complete by: As directed    Call MD for:  persistant nausea and vomiting   Complete by: As directed    Call MD for:  redness, tenderness, or signs of infection (pain, swelling, redness, odor or green/yellow discharge around incision site)   Complete by: As directed    Call MD for:  severe uncontrolled pain   Complete by: As directed    Call MD for:  temperature >100.4   Complete by: As directed    Diet - low sodium heart healthy   Complete by: As directed    Increase activity slowly  Complete by: As directed      Allergies as of 01/26/2019      Reactions   Cyclobenzaprine Anaphylaxis, Other (See Comments)   "stopped heart"   Morphine Anaphylaxis   "stopped heart"   Penicillins Shortness Of Breath, Swelling, Palpitations   Did it involve swelling of the face/tongue/throat, SOB, or low BP? Yes Did it involve sudden or severe rash/hives, skin peeling, or any reaction on the inside of your mouth or nose? No Did you need to seek medical attention at a hospital or doctor's office? No When did it last  happen?years  If all above answers are "NO", may proceed with cephalosporin use.   Hydromorphone Other (See Comments)   If administered quickly, felt like hand was "exploding"    Codeine Itching, Rash      Medication List    STOP taking these medications   diphenhydrAMINE 25 MG tablet Commonly known as: BENADRYL   labetalol 200 MG tablet Commonly known as: NORMODYNE   oxyCODONE-acetaminophen 5-325 MG tablet Commonly known as: Percocet     TAKE these medications   albuterol (2.5 MG/3ML) 0.083% nebulizer solution Commonly known as: PROVENTIL Take 2.5 mg by nebulization every 6 (six) hours as needed for wheezing or shortness of breath.   allopurinol 100 MG tablet Commonly known as: ZYLOPRIM Take 100 mg by mouth daily.   aspirin EC 81 MG tablet Take 81 mg by mouth daily.   Auryxia 1 GM 210 MG(Fe) tablet Generic drug: ferric citrate Take 420 mg by mouth See admin instructions. Take 3 tablets (630 mg) by mouth with each meal & take 2 tablets (420 mg) by mouth with each snack   dicyclomine 10 MG capsule Commonly known as: BENTYL Take 20 mg by mouth at bedtime.   docusate sodium 100 MG capsule Commonly known as: COLACE Take 1 capsule (100 mg total) by mouth 2 (two) times daily.   doxylamine (Sleep) 25 MG tablet Commonly known as: UNISOM Take 25 mg by mouth at bedtime as needed for sleep.   furosemide 80 MG tablet Commonly known as: LASIX Take 80 mg by mouth See admin instructions. Take one tablet on TUE THUR SAT before dialysis.   gabapentin 300 MG capsule Commonly known as: NEURONTIN Take 600 mg by mouth at bedtime as needed (leg pain.).   Ipratropium-Albuterol 20-100 MCG/ACT Aers respimat Commonly known as: COMBIVENT Inhale 2 puffs into the lungs 4 (four) times daily as needed for wheezing.   isosorbide mononitrate 30 MG 24 hr tablet Commonly known as: IMDUR Take 30 mg by mouth daily.   lidocaine-prilocaine cream Commonly known as: EMLA Apply 1  application topically daily as needed (1-2 hours before dialysis (cover with occlusive dressing)).   LUBRICATING EYE DROPS OP Place 1 drop into both eyes daily as needed (dry eyes).   metoprolol tartrate 100 MG tablet Commonly known as: LOPRESSOR Take 1 tablet (100 mg total) by mouth 2 (two) times daily.   nitroGLYCERIN 0.4 MG SL tablet Commonly known as: NITROSTAT Place 0.4 mg under the tongue every 5 (five) minutes as needed for chest pain.   ondansetron 4 MG tablet Commonly known as: ZOFRAN Take 4 mg by mouth 3 (three) times daily as needed for nausea.   pantoprazole 40 MG tablet Commonly known as: PROTONIX Take 1 tablet (40 mg total) by mouth daily. What changed:   when to take this  reasons to take this   polyethylene glycol 17 g packet Commonly known as: MIRALAX / GLYCOLAX Take 17 g  by mouth 2 (two) times daily. What changed: when to take this   pravastatin 80 MG tablet Commonly known as: PRAVACHOL Take 80 mg by mouth every evening.   sennosides-docusate sodium 8.6-50 MG tablet Commonly known as: SENOKOT-S Take 1 tablet by mouth 2 (two) times daily.   Symbicort 160-4.5 MCG/ACT inhaler Generic drug: budesonide-formoterol Inhale 2 puffs into the lungs every 4 (four) hours as needed for shortness of breath or wheezing.   Vitamin D3 50 MCG (2000 UT) capsule Take 2,000 Units by mouth daily.       Allergies  Allergen Reactions  . Cyclobenzaprine Anaphylaxis and Other (See Comments)    "stopped heart"   . Morphine Anaphylaxis    "stopped heart"  . Penicillins Shortness Of Breath, Swelling and Palpitations    Did it involve swelling of the face/tongue/throat, SOB, or low BP? Yes Did it involve sudden or severe rash/hives, skin peeling, or any reaction on the inside of your mouth or nose? No Did you need to seek medical attention at a hospital or doctor's office? No When did it last happen?years  If all above answers are "NO", may proceed with  cephalosporin use.   Marland Kitchen Hydromorphone Other (See Comments)    If administered quickly, felt like hand was "exploding"   . Codeine Itching and Rash    Consultations:  Nephro   Procedures/Studies: DG Chest 2 View  Result Date: 01/24/2019 CLINICAL DATA:  Shortness of breath EXAM: CHEST - 2 VIEW COMPARISON:  10/13/2018 FINDINGS: Right-sided central venous catheter tip at the cavoatrial junction. Trace bilateral pleural effusions. Pulmonary hyperinflation. Mild diffuse bilateral interstitial and ground-glass opacity. Stable cardiomediastinal silhouette with aortic atherosclerosis. No pneumothorax. IMPRESSION: Trace bilateral pleural effusions with mild diffuse interstitial and ground-glass opacity, suspect for mild edema. Electronically Signed   By: Donavan Foil M.D.   On: 01/24/2019 22:59   ECHOCARDIOGRAM COMPLETE  Result Date: 01/25/2019   ECHOCARDIOGRAM REPORT   Patient Name:   GRAYSON WHITE Date of Exam: 01/25/2019 Medical Rec #:  [id number]          Height:       61.0 in Accession #:    [id number]         Weight:       104.0 lb Date of Birth:  06-Nov-1950           BSA:          1.43 m Patient Age:    6 years           BP:           138/66 mmHg Patient Gender: F                  HR:           74 bpm. Exam Location:  Inpatient Procedure: 2D Echo, Color Doppler and Cardiac Doppler Indications:    R94.31 Abnormal EKG  History:        Patient has no prior history of Echocardiogram examinations.                 CAD, COPD, Arrythmias:Atrial Fibrillation; Risk                 Factors:Hypertension and Dyslipidemia. ESRD.  Sonographer:    Raquel Sarna Senior RDCS Referring Phys: [place]  1. Left ventricular ejection fraction, by visual estimation, is 45 to 50%. The left ventricle has mildly decreased function. Left ventricular septal wall thickness was mildly increased. There  is no left ventricular hypertrophy.  2. Left ventricular diastolic parameters are consistent with Grade I  diastolic dysfunction (impaired relaxation).  3. The left ventricle demonstrates global hypokinesis.  4. Elevated LVEDP.  5. Global right ventricle has normal systolic function.The right ventricular size is normal. No increase in right ventricular wall thickness.  6. Left atrial size was moderately dilated.  7. Right atrial size was normal.  8. Mild mitral annular calcification.  9. The mitral valve is normal in structure. Mild to moderate mitral valve regurgitation. No evidence of mitral stenosis. 10. The tricuspid valve is normal in structure. 11. The aortic valve is normal in structure. Aortic valve regurgitation is mild. No evidence of aortic valve sclerosis or stenosis. 12. The pulmonic valve was normal in structure. Pulmonic valve regurgitation is not visualized. 13. Mildly elevated pulmonary artery systolic pressure. 14. The inferior vena cava is normal in size with greater than 50% respiratory variability, suggesting right atrial pressure of 3 mmHg. FINDINGS  Left Ventricle: Left ventricular ejection fraction, by visual estimation, is 45 to 50%. The left ventricle has mildly decreased function. The left ventricle demonstrates global hypokinesis. There is no left ventricular hypertrophy. Left ventricular diastolic parameters are consistent with Grade I diastolic dysfunction (impaired relaxation). Normal left atrial pressure. Elevated LVEDP. Right Ventricle: The right ventricular size is normal. No increase in right ventricular wall thickness. Global RV systolic function is has normal systolic function. The tricuspid regurgitant velocity is 2.85 m/s, and with an assumed right atrial pressure  of 3 mmHg, the estimated right ventricular systolic pressure is mildly elevated at 35.5 mmHg. Left Atrium: Left atrial size was moderately dilated. Right Atrium: Right atrial size was normal in size Pericardium: There is no evidence of pericardial effusion. Mitral Valve: The mitral valve is normal in structure. There is  mild thickening of the mitral valve leaflet(s). Mild mitral annular calcification. Mild to moderate mitral valve regurgitation. No evidence of mitral valve stenosis by observation. Tricuspid Valve: The tricuspid valve is normal in structure. Tricuspid valve regurgitation is mild. Aortic Valve: The aortic valve is normal in structure. Aortic valve regurgitation is mild. The aortic valve is structurally normal, with no evidence of sclerosis or stenosis. Pulmonic Valve: The pulmonic valve was normal in structure. Pulmonic valve regurgitation is not visualized. Pulmonic regurgitation is not visualized. Aorta: The aortic root, ascending aorta and aortic arch are all structurally normal, with no evidence of dilitation or obstruction. Venous: The right upper and right lower pulmonary veins are and the left upper and right lower pulmonary veins are normal. The inferior vena cava is normal in size with greater than 50% respiratory variability, suggesting right atrial pressure of 3 mmHg. IAS/Shunts: No atrial level shunt detected by color flow Doppler. There is no evidence of a patent foramen ovale. No ventricular septal defect is seen or detected. There is no evidence of an atrial septal defect.  LEFT VENTRICLE PLAX 2D LVIDd:         4.10 cm       Diastology LVIDs:         3.20 cm       LV e' lateral:   7.94 cm/s LV PW:         1.00 cm       LV E/e' lateral: 14.4 LV IVS:        1.00 cm       LV e' medial:    6.85 cm/s LVOT diam:     1.60 cm  LV E/e' medial:  16.6 LV SV:         33 ml LV SV Index:   23.37 LVOT Area:     2.01 cm  LV Volumes (MOD) LV area d, A2C:    29.70 cm LV area d, A4C:    28.50 cm LV area s, A2C:    19.20 cm LV area s, A4C:    20.20 cm LV major d, A2C:   8.52 cm LV major d, A4C:   8.13 cm LV major s, A2C:   7.45 cm LV major s, A4C:   7.51 cm LV vol d, MOD A2C: 86.0 ml LV vol d, MOD A4C: 80.5 ml LV vol s, MOD A2C: 43.3 ml LV vol s, MOD A4C: 46.8 ml LV SV MOD A2C:     42.7 ml LV SV MOD A4C:      80.5 ml LV SV MOD BP:      40.1 ml RIGHT VENTRICLE RV S prime:     10.00 cm/s TAPSE (M-mode): 1.7 cm LEFT ATRIUM             Index       RIGHT ATRIUM           Index LA diam:        4.20 cm 2.94 cm/m  RA Area:     13.70 cm LA Vol (A2C):   55.0 ml 38.45 ml/m RA Volume:   32.50 ml  22.72 ml/m LA Vol (A4C):   49.9 ml 34.88 ml/m LA Biplane Vol: 54.2 ml 37.89 ml/m  AORTIC VALVE LVOT Vmax:   130.00 cm/s LVOT Vmean:  86.000 cm/s LVOT VTI:    0.298 m  AORTA Ao Root diam: 2.60 cm Ao Asc diam:  2.60 cm MITRAL VALVE                         TRICUSPID VALVE MV Area (PHT): 3.37 cm              TR Peak grad:   32.5 mmHg MV PHT:        65.25 msec            TR Vmax:        285.00 cm/s MV Decel Time: 225 msec MV E velocity: 114.00 cm/s 103 cm/s  SHUNTS MV A velocity: 123.00 cm/s 70.3 cm/s Systemic VTI:  0.30 m MV E/A ratio:  0.93        1.5       Systemic Diam: 1.60 cm  Skeet Latch MD Electronically signed by Skeet Latch MD Signature Date/Time: 01/25/2019/2:15:04 PM    Final     Subjective: No acute issues or events overnight, patient feels quite well, no further episodes of dyspnea, palpitations.  Denies nausea, vomiting, diarrhea, constipation, headache, fever, chills.  Discharge Exam: Vitals:   01/26/19 0420 01/26/19 0718  BP: (!) 144/72 127/81  Pulse: 73 79  Resp: (!) 21 18  Temp: 98.2 F (36.8 C) 98.2 F (36.8 C)  SpO2: 98% 92%   Vitals:   01/26/19 0200 01/26/19 0310 01/26/19 0420 01/26/19 0718  BP: 140/76 (!) 160/80 (!) 144/72 127/81  Pulse: 74 72 73 79  Resp: 20 20 (!) 21 18  Temp:  98 F (36.7 C) 98.2 F (36.8 C) 98.2 F (36.8 C)  TempSrc:  Oral Oral Oral  SpO2:  100% 98% 92%  Weight:  47 kg    Height:  General:  Pleasantly resting in bed, No acute distress. HEENT:  Normocephalic atraumatic.  Sclerae nonicteric, noninjected.  Extraocular movements intact bilaterally. Neck:  Without mass or deformity.  Trachea is midline.  Right IJ catheter for dialysis noted. Lungs:   Clear to auscultate bilaterally without rhonchi, wheeze, or rales. Heart:  Regular rate and rhythm.  Without murmurs, rubs, or gallops. Abdomen:  Soft, nontender, nondistended.  Without guarding or rebound. Extremities: Without cyanosis, clubbing, edema, or obvious deformity.  Left upper extremity fistula, adequate thrill Vascular:  Dorsalis pedis and posterior tibial pulses palpable bilaterally. Skin:  Warm and dry, no erythema, no ulcerations.   The results of significant diagnostics from this hospitalization (including imaging, microbiology, ancillary and laboratory) are listed below for reference.     Microbiology: Recent Results (from the past 240 hour(s))  SARS CORONAVIRUS 2 (TAT 6-24 HRS) Nasopharyngeal Nasopharyngeal Swab     Status: None   Collection Time: 01/25/19  2:20 AM   Specimen: Nasopharyngeal Swab  Result Value Ref Range Status   SARS Coronavirus 2 NEGATIVE NEGATIVE Final    Comment: (NOTE) SARS-CoV-2 target nucleic acids are NOT DETECTED. The SARS-CoV-2 RNA is generally detectable in upper and lower respiratory specimens during the acute phase of infection. Negative results do not preclude SARS-CoV-2 infection, do not rule out co-infections with other pathogens, and should not be used as the sole basis for treatment or other patient management decisions. Negative results must be combined with clinical observations, patient history, and epidemiological information. The expected result is Negative. Fact Sheet for Patients: SugarRoll.be Fact Sheet for Healthcare Providers: https://www.woods-mathews.com/ This test is not yet approved or cleared by the [place] FDA and  has been authorized for detection and/or diagnosis of SARS-CoV-2 by FDA under an Emergency Use Authorization (EUA). This EUA will remain  in effect (meaning this test can be used) for the duration of the COVID-19 declaration under Section 56 4(b)(1) of the  Act, 21 U.S.C. section 360bbb-3(b)(1), unless the authorization is terminated or revoked sooner. Performed at [facility], [place] [address], [place], [place] [address]      Labs: BNP (last 3 results) Recent Labs    01/24/19 2228  BNP 8,250.5*   Basic Metabolic Panel: Recent Labs  Lab 01/24/19 2228 01/26/19 0352  NA 138 137  K 3.9 4.4  CL 104 98  CO2 20* 24  GLUCOSE 120* 103*  BUN 55* 14  CREATININE 7.77* 2.84*  CALCIUM 8.1* 8.6*   Liver Function Tests: Recent Labs  Lab 01/24/19 2228  AST 15  ALT 11  ALKPHOS 59  BILITOT 1.3*  PROT 6.3*  ALBUMIN 3.4*   No results for input(s): LIPASE, AMYLASE in the last 168 hours. No results for input(s): AMMONIA in the last 168 hours. CBC: Recent Labs  Lab 01/24/19 2228 01/26/19 0352  WBC 8.1 8.4  NEUTROABS 6.1  --   HGB 12.0 12.7  HCT 37.7 37.8  MCV 95.0 90.4  PLT 174 217   Cardiac Enzymes: No results for input(s): CKTOTAL, CKMB, CKMBINDEX, TROPONINI in the last 168 hours. BNP: Invalid input(s): POCBNP CBG: Recent Labs  Lab 01/24/19 2203  GLUCAP 79   D-Dimer No results for input(s): DDIMER in the last 72 hours. Hgb A1c No results for input(s): HGBA1C in the last 72 hours. Lipid Profile No results for input(s): CHOL, HDL, LDLCALC, TRIG, CHOLHDL, LDLDIRECT in the last 72 hours. Thyroid function studies No results for input(s): TSH, T4TOTAL, T3FREE, THYROIDAB in the last 72 hours.  Invalid input(s): FREET3 Anemia work up No results for input(s): VITAMINB12, FOLATE, FERRITIN, TIBC, IRON, RETICCTPCT in the last 72 hours. Urinalysis    Component Value Date/Time   COLORURINE YELLOW 09/15/2018 0751   APPEARANCEUR CLOUDY (A) 09/15/2018 0751   LABSPEC 1.009 09/15/2018 0751   PHURINE 6.0 09/15/2018 0751   GLUCOSEU NEGATIVE 09/15/2018 0751   HGBUR NEGATIVE 09/15/2018 0751   BILIRUBINUR NEGATIVE 09/15/2018 0751   KETONESUR NEGATIVE 09/15/2018 0751   PROTEINUR 100 (A) 09/15/2018 0751   NITRITE NEGATIVE  09/15/2018 0751   LEUKOCYTESUR MODERATE (A) 09/15/2018 0751   Sepsis Labs Invalid input(s): PROCALCITONIN,  WBC,  LACTICIDVEN Microbiology Recent Results (from the past 240 hour(s))  SARS CORONAVIRUS 2 (TAT 6-24 HRS) Nasopharyngeal Nasopharyngeal Swab     Status: None   Collection Time: 01/25/19  2:20 AM   Specimen: Nasopharyngeal Swab  Result Value Ref Range Status   SARS Coronavirus 2 NEGATIVE NEGATIVE Final    Comment: (NOTE) SARS-CoV-2 target nucleic acids are NOT DETECTED. The SARS-CoV-2 RNA is generally detectable in upper and lower respiratory specimens during the acute phase of infection. Negative results do not preclude SARS-CoV-2 infection, do not rule out co-infections with other pathogens, and should not be used as the sole basis for treatment or other patient management decisions. Negative results must be combined with clinical observations, patient history, and epidemiological information. The expected result is Negative. Fact Sheet for Patients: SugarRoll.be Fact Sheet for Healthcare Providers: https://www.woods-mathews.com/ This test is not yet approved or cleared by the [place] FDA and  has been authorized for detection and/or diagnosis of SARS-CoV-2 by FDA under an Emergency Use Authorization (EUA). This EUA will remain  in effect (meaning this test can be used) for the duration of the COVID-19 declaration under Section 56 4(b)(1) of the Act, 21 U.S.C. section 360bbb-3(b)(1), unless the authorization is terminated or revoked sooner. Performed at [facility], [place] [address], [place], [place] [address]      Time coordinating discharge: Over 30 minutes  SIGNED:   Little Ishikawa, DO [facility] 01/26/2019, 10:45 AM Pager   If 7PM-7AM, please contact night-coverage [email or web address] Password [email or web address]

## 2019-01-26 NOTE — Progress Notes (Signed)
Patient pulled out PIV access accidentally in the morning. Received discharge instructions and she understood it well. Patient took her all belongings. HS [employer]

## 2019-01-26 NOTE — Progress Notes (Signed)
SpO2 92% at rest with RA. SpO2 was staying lower 90's while pt was ambulating. Patient denied any SOB with ambulation. Patient doesn't need to have oxygen with activities. HS [employer]

## 2019-01-28 ENCOUNTER — Telehealth: Payer: Self-pay | Admitting: Physician Assistant

## 2019-01-28 NOTE — Telephone Encounter (Signed)
Transition of Care from inpatient facility  Date of discharge: 01/26/19 Date of contact: 01/28/2019 Method of contact: phone  Attempted to contact patient to discuss transition of care from in patient admission. Patient did not answer the phone. Message was left on the patient's voicemail.

## 2019-01-31 ENCOUNTER — Telehealth: Payer: Self-pay | Admitting: Nephrology

## 2019-01-31 NOTE — Telephone Encounter (Signed)
Transition of Care Contact from [place]   Date of Discharge: 01/26/2019 Date of Contact: 01/31/2019 Method of contact: phone Talked to patient   Patient contacted to discuss transition of care form recent hospitaliztion. Patient was admitted to [facility] from 01/24/19 to 2/30/20 with the discharge diagnosis of A fib with RVR.    Medication changes were reviewed including stopping labetalol, percocet and benadryl.   Patient will follow up with is outpatient dialysis center 02/01/19.   Other follow up needs include f/u visit with cardiology.   Jen Mow, PA-C [facility] Pager: [phone]

## 2019-02-02 ENCOUNTER — Ambulatory Visit (INDEPENDENT_AMBULATORY_CARE_PROVIDER_SITE_OTHER): Payer: Self-pay | Admitting: Physician Assistant

## 2019-02-02 ENCOUNTER — Other Ambulatory Visit: Payer: Self-pay

## 2019-02-02 VITALS — BP 174/73 | HR 69 | Temp 98.6°F | Resp 18 | Ht 62.0 in | Wt 108.0 lb

## 2019-02-02 DIAGNOSIS — N186 End stage renal disease: Secondary | ICD-10-CM

## 2019-02-02 NOTE — Progress Notes (Signed)
POST OPERATIVE OFFICE NOTE    CC:  Pain in left UE post second stage transposition basilic fistula  HPI:  This is a 69 y.o. female who is here with reported post op pain in the left UE.  She reports mild discomfort when applying pressure to her distal radius on the left.  She denies hand pain, numbness or problems with motor function.  The  Left arm first stage basilic vein AV fistula creation was performed 08/04/18 followed by transposition on 11/24/18 by Dr. Donzetta Matters. She dialyzes via right IJ tunneled dialysis catheter.  She reports no issues with the catheter.  She denies fever or chills  Allergies  Allergen Reactions  . Cyclobenzaprine Anaphylaxis and Other (See Comments)    "stopped heart"   . Morphine Anaphylaxis    "stopped heart"  . Penicillins Shortness Of Breath, Swelling and Palpitations    Did it involve swelling of the face/tongue/throat, SOB, or low BP? Yes Did it involve sudden or severe rash/hives, skin peeling, or any reaction on the inside of your mouth or nose? No Did you need to seek medical attention at a hospital or doctor's office? No When did it last happen?years  If all above answers are "NO", may proceed with cephalosporin use.   Marland Kitchen Hydromorphone Other (See Comments)    If administered quickly, felt like hand was "exploding"   . Codeine Itching and Rash    Current Outpatient Medications  Medication Sig Dispense Refill  . albuterol (PROVENTIL) (2.5 MG/3ML) 0.083% nebulizer solution Take 2.5 mg by nebulization every 6 (six) hours as needed for wheezing or shortness of breath.    . allopurinol (ZYLOPRIM) 100 MG tablet Take 100 mg by mouth daily.    Marland Kitchen aspirin EC 81 MG tablet Take 81 mg by mouth daily.    Lorin Picket 1 GM 210 MG(Fe) tablet Take 420 mg by mouth See admin instructions. Take 3 tablets (630 mg) by mouth with each meal & take 2 tablets (420 mg) by mouth with each snack    . Carboxymethylcellul-Glycerin (LUBRICATING EYE DROPS OP) Place 1 drop into  both eyes daily as needed (dry eyes).    . Cholecalciferol (VITAMIN D3) 50 MCG (2000 UT) capsule Take 2,000 Units by mouth daily.     Marland Kitchen dicyclomine (BENTYL) 10 MG capsule Take 20 mg by mouth at bedtime.     . docusate sodium (COLACE) 100 MG capsule Take 1 capsule (100 mg total) by mouth 2 (two) times daily. 30 capsule 0  . doxylamine, Sleep, (UNISOM) 25 MG tablet Take 25 mg by mouth at bedtime as needed for sleep.    . furosemide (LASIX) 80 MG tablet Take 80 mg by mouth See admin instructions. Take one tablet on TUE THUR SAT before dialysis.    Marland Kitchen gabapentin (NEURONTIN) 300 MG capsule Take 600 mg by mouth at bedtime as needed (leg pain.).     Marland Kitchen Ipratropium-Albuterol (COMBIVENT) 20-100 MCG/ACT AERS respimat Inhale 2 puffs into the lungs 4 (four) times daily as needed for wheezing.    . isosorbide mononitrate (IMDUR) 30 MG 24 hr tablet Take 30 mg by mouth daily.    Marland Kitchen lidocaine-prilocaine (EMLA) cream Apply 1 application topically daily as needed (1-2 hours before dialysis (cover with occlusive dressing)).     . metoprolol tartrate (LOPRESSOR) 100 MG tablet Take 1 tablet (100 mg total) by mouth 2 (two) times daily. 60 tablet 0  . nitroGLYCERIN (NITROSTAT) 0.4 MG SL tablet Place 0.4 mg under the tongue every 5 (  five) minutes as needed for chest pain.     Marland Kitchen ondansetron (ZOFRAN) 4 MG tablet Take 4 mg by mouth 3 (three) times daily as needed for nausea.    . pantoprazole (PROTONIX) 40 MG tablet Take 1 tablet (40 mg total) by mouth daily. (Patient taking differently: Take 40 mg by mouth daily as needed (acid reflux/indigestion.). )    . polyethylene glycol (MIRALAX / GLYCOLAX) 17 g packet Take 17 g by mouth 2 (two) times daily. (Patient taking differently: Take 17 g by mouth every other day. )    . pravastatin (PRAVACHOL) 80 MG tablet Take 80 mg by mouth every evening.     . sennosides-docusate sodium (SENOKOT-S) 8.6-50 MG tablet Take 1 tablet by mouth 2 (two) times daily.     . SYMBICORT 160-4.5 MCG/ACT  inhaler Inhale 2 puffs into the lungs every 4 (four) hours as needed for shortness of breath or wheezing.     No current facility-administered medications for this visit.     ROS:  See HPI  Physical Exam:  Incision: Well-healed Extremities: 2+ radial pulse; good thrill and bruit in fistula.  5/5 grip strength.  The patient is quite thin and the fistula is easily visible and palpable along its course.  There no skin changes or bony deformity of the left forearm.  Nothing to suggest cellulitis or phlebitis.  She has a well-healed incision from prior forearm fistula attempt with subsequent failure. Neuro: Alert and oriented x4   Assessment/Plan:  This is a 69 y.o. female who is s/p: Completion of the upper extremity basilic vein transposition.  Fistula is well matured.  Okay to begin accessing fistula for hemodialysis treatment.  Once her nephrology team is satisfied with function of fistula, her tunneled dialysis catheter would need to be removed.  The patient indicates she has a history of peripheral vascular disease with pain in both legs after walking approximately 10 feet.  She says the pain stops with rest.  She has a history of cigarette smoking, none currently.  She believes it has been approximately 1 year since her last ABIs.  Examination of her feet reveals thin shiny skin, her forefeet are hyperemic without ulcers.  No palpable pulses  -We will obtain ABIs and follow-up in 2 to 3 weeks for evaluation of PAD  Barbie Banner, PA-C [facility] [phone]  Clinic MD: Oneida Alar

## 2019-02-04 ENCOUNTER — Emergency Department (HOSPITAL_COMMUNITY): Payer: Medicare Other

## 2019-02-04 ENCOUNTER — Inpatient Hospital Stay (HOSPITAL_COMMUNITY)
Admission: EM | Admit: 2019-02-04 | Discharge: 2019-02-07 | DRG: 291 | Disposition: A | Discharge status: Home Health | Payer: Medicare Other | Attending: Internal Medicine | Admitting: Internal Medicine

## 2019-02-04 DIAGNOSIS — Z87891 Personal history of nicotine dependence: Secondary | ICD-10-CM

## 2019-02-04 DIAGNOSIS — E875 Hyperkalemia: Secondary | ICD-10-CM | POA: Diagnosis present

## 2019-02-04 DIAGNOSIS — I2510 Atherosclerotic heart disease of native coronary artery without angina pectoris: Secondary | ICD-10-CM | POA: Diagnosis present

## 2019-02-04 DIAGNOSIS — M1990 Unspecified osteoarthritis, unspecified site: Secondary | ICD-10-CM | POA: Diagnosis present

## 2019-02-04 DIAGNOSIS — D631 Anemia in chronic kidney disease: Secondary | ICD-10-CM | POA: Diagnosis present

## 2019-02-04 DIAGNOSIS — J9620 Acute and chronic respiratory failure, unspecified whether with hypoxia or hypercapnia: Secondary | ICD-10-CM

## 2019-02-04 DIAGNOSIS — Z955 Presence of coronary angioplasty implant and graft: Secondary | ICD-10-CM | POA: Diagnosis not present

## 2019-02-04 DIAGNOSIS — Z20822 Contact with and (suspected) exposure to covid-19: Secondary | ICD-10-CM | POA: Diagnosis present

## 2019-02-04 DIAGNOSIS — I132 Hypertensive heart and chronic kidney disease with heart failure and with stage 5 chronic kidney disease, or end stage renal disease: Secondary | ICD-10-CM | POA: Diagnosis present

## 2019-02-04 DIAGNOSIS — Z66 Do not resuscitate: Secondary | ICD-10-CM | POA: Diagnosis present

## 2019-02-04 DIAGNOSIS — N186 End stage renal disease: Secondary | ICD-10-CM | POA: Diagnosis present

## 2019-02-04 DIAGNOSIS — Z880 Allergy status to penicillin: Secondary | ICD-10-CM

## 2019-02-04 DIAGNOSIS — J9601 Acute respiratory failure with hypoxia: Secondary | ICD-10-CM | POA: Diagnosis present

## 2019-02-04 DIAGNOSIS — K219 Gastro-esophageal reflux disease without esophagitis: Secondary | ICD-10-CM | POA: Diagnosis present

## 2019-02-04 DIAGNOSIS — E785 Hyperlipidemia, unspecified: Secondary | ICD-10-CM | POA: Diagnosis present

## 2019-02-04 DIAGNOSIS — N2581 Secondary hyperparathyroidism of renal origin: Secondary | ICD-10-CM | POA: Diagnosis present

## 2019-02-04 DIAGNOSIS — J9690 Respiratory failure, unspecified, unspecified whether with hypoxia or hypercapnia: Secondary | ICD-10-CM | POA: Diagnosis present

## 2019-02-04 DIAGNOSIS — I5043 Acute on chronic combined systolic (congestive) and diastolic (congestive) heart failure: Secondary | ICD-10-CM | POA: Diagnosis present

## 2019-02-04 DIAGNOSIS — M109 Gout, unspecified: Secondary | ICD-10-CM | POA: Diagnosis present

## 2019-02-04 DIAGNOSIS — R079 Chest pain, unspecified: Secondary | ICD-10-CM

## 2019-02-04 DIAGNOSIS — I480 Paroxysmal atrial fibrillation: Secondary | ICD-10-CM | POA: Diagnosis present

## 2019-02-04 DIAGNOSIS — R296 Repeated falls: Secondary | ICD-10-CM | POA: Diagnosis present

## 2019-02-04 DIAGNOSIS — Z885 Allergy status to narcotic agent status: Secondary | ICD-10-CM

## 2019-02-04 DIAGNOSIS — R29898 Other symptoms and signs involving the musculoskeletal system: Secondary | ICD-10-CM

## 2019-02-04 DIAGNOSIS — I5023 Acute on chronic systolic (congestive) heart failure: Secondary | ICD-10-CM | POA: Diagnosis present

## 2019-02-04 DIAGNOSIS — Z888 Allergy status to other drugs, medicaments and biological substances status: Secondary | ICD-10-CM | POA: Diagnosis not present

## 2019-02-04 DIAGNOSIS — Z992 Dependence on renal dialysis: Secondary | ICD-10-CM

## 2019-02-04 DIAGNOSIS — I953 Hypotension of hemodialysis: Secondary | ICD-10-CM | POA: Diagnosis not present

## 2019-02-04 DIAGNOSIS — E1122 Type 2 diabetes mellitus with diabetic chronic kidney disease: Secondary | ICD-10-CM | POA: Diagnosis present

## 2019-02-04 DIAGNOSIS — J9602 Acute respiratory failure with hypercapnia: Secondary | ICD-10-CM | POA: Diagnosis present

## 2019-02-04 DIAGNOSIS — Z7982 Long term (current) use of aspirin: Secondary | ICD-10-CM

## 2019-02-04 DIAGNOSIS — J449 Chronic obstructive pulmonary disease, unspecified: Secondary | ICD-10-CM | POA: Diagnosis present

## 2019-02-04 LAB — RESPIRATORY PANEL BY RT PCR (FLU A&B, COVID)
Influenza A by PCR: NEGATIVE
Influenza B by PCR: NEGATIVE
SARS Coronavirus 2 by RT PCR: NEGATIVE

## 2019-02-04 LAB — POCT I-STAT 7, (LYTES, BLD GAS, ICA,H+H)
Acid-base deficit: 3 mmol/L — ABNORMAL HIGH (ref 0.0–2.0)
Acid-base deficit: 2 mmol/L (ref 0.0–2.0)
Bicarbonate: 25.7 mmol/L (ref 20.0–28.0)
Bicarbonate: 23.3 mmol/L (ref 20.0–28.0)
Calcium, Ion: 1.1 mmol/L — ABNORMAL LOW (ref 1.15–1.40)
Calcium, Ion: 1.07 mmol/L — ABNORMAL LOW (ref 1.15–1.40)
HCT: 35 % — ABNORMAL LOW (ref 36.0–46.0)
HCT: 32 % — ABNORMAL LOW (ref 36.0–46.0)
Hemoglobin: 11.9 g/dL — ABNORMAL LOW (ref 12.0–15.0)
Hemoglobin: 10.9 g/dL — ABNORMAL LOW (ref 12.0–15.0)
O2 Saturation: 100 %
O2 Saturation: 95 %
Patient temperature: 98.3
Patient temperature: 98.6
Potassium: 5.8 mmol/L — ABNORMAL HIGH (ref 3.5–5.1)
Potassium: 4.7 mmol/L (ref 3.5–5.1)
Sodium: 136 mmol/L (ref 135–145)
Sodium: 136 mmol/L (ref 135–145)
TCO2: 28 mmol/L (ref 22–32)
TCO2: 25 mmol/L (ref 22–32)
pCO2 arterial: 65.4 mmHg (ref 32.0–48.0)
pCO2 arterial: 42 mmHg (ref 32.0–48.0)
pH, Arterial: 7.202 — ABNORMAL LOW (ref 7.350–7.450)
pH, Arterial: 7.353 (ref 7.350–7.450)
pO2, Arterial: 481 mmHg — ABNORMAL HIGH (ref 83.0–108.0)
pO2, Arterial: 80 mmHg — ABNORMAL LOW (ref 83.0–108.0)

## 2019-02-04 LAB — CBC WITH DIFFERENTIAL/PLATELET
Abs Immature Granulocytes: 0.29 10*3/uL — ABNORMAL HIGH (ref 0.00–0.07)
Basophils Absolute: 0 10*3/uL (ref 0.0–0.1)
Basophils Relative: 0 %
Eosinophils Absolute: 0.1 10*3/uL (ref 0.0–0.5)
Eosinophils Relative: 0 %
HCT: 37.5 % (ref 36.0–46.0)
Hemoglobin: 12.1 g/dL (ref 12.0–15.0)
Immature Granulocytes: 2 %
Lymphocytes Relative: 3 %
Lymphs Abs: 0.6 10*3/uL — ABNORMAL LOW (ref 0.7–4.0)
MCH: 31.4 pg (ref 26.0–34.0)
MCHC: 32.3 g/dL (ref 30.0–36.0)
MCV: 97.4 fL (ref 80.0–100.0)
Monocytes Absolute: 0.7 10*3/uL (ref 0.1–1.0)
Monocytes Relative: 4 %
Neutro Abs: 17.5 10*3/uL — ABNORMAL HIGH (ref 1.7–7.7)
Neutrophils Relative %: 91 %
Platelets: 230 10*3/uL (ref 150–400)
RBC: 3.85 MIL/uL — ABNORMAL LOW (ref 3.87–5.11)
RDW: 15.3 % (ref 11.5–15.5)
WBC: 19.2 10*3/uL — ABNORMAL HIGH (ref 4.0–10.5)
nRBC: 0 % (ref 0.0–0.2)

## 2019-02-04 LAB — MRSA PCR SCREENING: MRSA by PCR: NEGATIVE

## 2019-02-04 LAB — PHOSPHORUS
Phosphorus: 4.4 mg/dL (ref 2.5–4.6)
Phosphorus: 5.6 mg/dL — ABNORMAL HIGH (ref 2.5–4.6)

## 2019-02-04 LAB — COMPREHENSIVE METABOLIC PANEL
ALT: 25 U/L (ref 0–44)
AST: 29 U/L (ref 15–41)
Albumin: 3.2 g/dL — ABNORMAL LOW (ref 3.5–5.0)
Alkaline Phosphatase: 62 U/L (ref 38–126)
Anion gap: 13 (ref 5–15)
BUN: 53 mg/dL — ABNORMAL HIGH (ref 8–23)
CO2: 22 mmol/L (ref 22–32)
Calcium: 7.8 mg/dL — ABNORMAL LOW (ref 8.9–10.3)
Chloride: 103 mmol/L (ref 98–111)
Creatinine, Ser: 6.21 mg/dL — ABNORMAL HIGH (ref 0.44–1.00)
GFR calc Af Amer: 7 mL/min — ABNORMAL LOW (ref >60)
GFR calc non Af Amer: 6 mL/min — ABNORMAL LOW (ref >60)
Glucose, Bld: 171 mg/dL — ABNORMAL HIGH (ref 70–99)
Potassium: 5.9 mmol/L — ABNORMAL HIGH (ref 3.5–5.1)
Sodium: 138 mmol/L (ref 135–145)
Total Bilirubin: 0.7 mg/dL (ref 0.3–1.2)
Total Protein: 6.4 g/dL — ABNORMAL LOW (ref 6.5–8.1)

## 2019-02-04 LAB — MAGNESIUM
Magnesium: 1.9 mg/dL (ref 1.7–2.4)
Magnesium: 2 mg/dL (ref 1.7–2.4)
Magnesium: 2 mg/dL (ref 1.7–2.4)

## 2019-02-04 LAB — GLUCOSE, CAPILLARY
Glucose-Capillary: 49 mg/dL — ABNORMAL LOW (ref 70–99)
Glucose-Capillary: 140 mg/dL — ABNORMAL HIGH (ref 70–99)
Glucose-Capillary: 151 mg/dL — ABNORMAL HIGH (ref 70–99)
Glucose-Capillary: 55 mg/dL — ABNORMAL LOW (ref 70–99)
Glucose-Capillary: 100 mg/dL — ABNORMAL HIGH (ref 70–99)
Glucose-Capillary: 121 mg/dL — ABNORMAL HIGH (ref 70–99)
Glucose-Capillary: 133 mg/dL — ABNORMAL HIGH (ref 70–99)

## 2019-02-04 LAB — TROPONIN I (HIGH SENSITIVITY)
Troponin I (High Sensitivity): 35 ng/L — ABNORMAL HIGH (ref <18)
Troponin I (High Sensitivity): 37 ng/L — ABNORMAL HIGH (ref <18)

## 2019-02-04 LAB — CBG MONITORING, ED: Glucose-Capillary: 217 mg/dL — ABNORMAL HIGH (ref 70–99)

## 2019-02-04 LAB — HEMOGLOBIN A1C
Hgb A1c MFr Bld: 5.5 % (ref 4.8–5.6)
Mean Plasma Glucose: 111.15 mg/dL

## 2019-02-04 LAB — BRAIN NATRIURETIC PEPTIDE: B Natriuretic Peptide: 4197.2 pg/mL — ABNORMAL HIGH (ref 0.0–100.0)

## 2019-02-04 MED ORDER — VANCOMYCIN HCL IN DEXTROSE 500-5 MG/100ML-% IV SOLN: 500.0000 mg | INTRAVENOUS | Status: DC

## 2019-02-04 MED ORDER — VITAL HIGH PROTEIN PO LIQD
1000.0000 mL | ORAL | Status: DC
  Administered 2019-02-04: 11:00:00 1000 mL

## 2019-02-04 MED ORDER — ASPIRIN 81 MG PO CHEW
81.0000 mg | CHEWABLE_TABLET | Freq: Every day | ORAL | Status: DC
  Administered 2019-02-04 – 2019-02-07 (×4): 81 mg
  Filled 2019-02-04 (×4): qty 1

## 2019-02-04 MED ORDER — CALCIUM GLUCONATE-NACL 1-0.675 GM/50ML-% IV SOLN
1.0000 g | Freq: Once | INTRAVENOUS | Status: AC
  Administered 2019-02-04: 06:00:00 1000 mg via INTRAVENOUS
  Filled 2019-02-04: qty 50

## 2019-02-04 MED ORDER — INSULIN ASPART 100 UNIT/ML IV SOLN
10.0000 [IU] | Freq: Once | INTRAVENOUS | Status: AC
  Administered 2019-02-04: 04:00:00 10 [IU] via INTRAVENOUS

## 2019-02-04 MED ORDER — FENTANYL CITRATE (PF) 100 MCG/2ML IJ SOLN
25.0000 ug | INTRAMUSCULAR | Status: DC | PRN
  Administered 2019-02-05: 10:00:00 100 ug via INTRAVENOUS
  Filled 2019-02-04: qty 2

## 2019-02-04 MED ORDER — ORAL CARE MOUTH RINSE
15.0000 mL | OROMUCOSAL | Status: DC
  Administered 2019-02-04 – 2019-02-05 (×16): 15 mL via OROMUCOSAL

## 2019-02-04 MED ORDER — ETOMIDATE 2 MG/ML IV SOLN
INTRAVENOUS | Status: AC | PRN
  Administered 2019-02-04: 20 mg via INTRAVENOUS

## 2019-02-04 MED ORDER — PANTOPRAZOLE SODIUM 40 MG IV SOLR
40.0000 mg | Freq: Every day | INTRAVENOUS | Status: DC
  Administered 2019-02-04 – 2019-02-05 (×2): 40 mg via INTRAVENOUS
  Filled 2019-02-04 (×2): qty 40

## 2019-02-04 MED ORDER — VITAL HIGH PROTEIN PO LIQD: 1000.0000 mL | ORAL | Status: DC

## 2019-02-04 MED ORDER — IPRATROPIUM BROMIDE 0.02 % IN SOLN
0.5000 mg | Freq: Four times a day (QID) | RESPIRATORY_TRACT | Status: DC
  Administered 2019-02-04 – 2019-02-06 (×10): 0.5 mg via RESPIRATORY_TRACT
  Filled 2019-02-04 (×9): qty 2.5

## 2019-02-04 MED ORDER — CHLORHEXIDINE GLUCONATE 0.12% ORAL RINSE (MEDLINE KIT)
15.0000 mL | Freq: Two times a day (BID) | OROMUCOSAL | Status: DC
  Administered 2019-02-04 – 2019-02-05 (×4): 15 mL via OROMUCOSAL

## 2019-02-04 MED ORDER — PRO-STAT SUGAR FREE PO LIQD
30.0000 mL | Freq: Two times a day (BID) | ORAL | Status: DC
  Administered 2019-02-04: 11:00:00 30 mL
  Filled 2019-02-04: qty 30

## 2019-02-04 MED ORDER — SUCCINYLCHOLINE CHLORIDE 20 MG/ML IJ SOLN
INTRAMUSCULAR | Status: AC | PRN
  Administered 2019-02-04: 100 mg via INTRAVENOUS

## 2019-02-04 MED ORDER — ALBUTEROL SULFATE (2.5 MG/3ML) 0.083% IN NEBU
2.5000 mg | INHALATION_SOLUTION | Freq: Once | RESPIRATORY_TRACT | Status: AC
  Administered 2019-02-04: 2.5 mg via RESPIRATORY_TRACT
  Filled 2019-02-04: qty 3

## 2019-02-04 MED ORDER — DEXTROSE 50 % IV SOLN
25.0000 g | INTRAVENOUS | Status: AC
  Administered 2019-02-04: 08:00:00 25 g via INTRAVENOUS
  Filled 2019-02-04: qty 50

## 2019-02-04 MED ORDER — FENTANYL CITRATE (PF) 100 MCG/2ML IJ SOLN: 25.0000 ug | INTRAMUSCULAR | Status: DC | PRN

## 2019-02-04 MED ORDER — VITAL HIGH PROTEIN PO LIQD
1000.0000 mL | ORAL | Status: AC
  Administered 2019-02-04: 17:00:00 1000 mL

## 2019-02-04 MED ORDER — PREDNISONE 20 MG PO TABS: 50.0000 mg | ORAL_TABLET | Freq: Every day | ORAL | Status: DC

## 2019-02-04 MED ORDER — VITAL AF 1.2 CAL PO LIQD
1000.0000 mL | ORAL | Status: DC
  Administered 2019-02-05: 10:00:00 1000 mL
  Filled 2019-02-04: qty 1000

## 2019-02-04 MED ORDER — ARFORMOTEROL TARTRATE 15 MCG/2ML IN NEBU
15.0000 ug | INHALATION_SOLUTION | Freq: Two times a day (BID) | RESPIRATORY_TRACT | Status: DC
  Administered 2019-02-04 – 2019-02-07 (×7): 15 ug via RESPIRATORY_TRACT
  Filled 2019-02-04 (×7): qty 2

## 2019-02-04 MED ORDER — BUDESONIDE 0.5 MG/2ML IN SUSP
0.5000 mg | Freq: Two times a day (BID) | RESPIRATORY_TRACT | Status: DC
  Administered 2019-02-04 – 2019-02-07 (×7): 0.5 mg via RESPIRATORY_TRACT
  Filled 2019-02-04 (×8): qty 2

## 2019-02-04 MED ORDER — DEXTROSE 50 % IV SOLN
50.0000 mL | Freq: Once | INTRAVENOUS | Status: AC
  Administered 2019-02-04: 04:00:00 50 mL via INTRAVENOUS
  Filled 2019-02-04: qty 50

## 2019-02-04 MED ORDER — DARBEPOETIN ALFA 60 MCG/0.3ML IJ SOSY
60.0000 ug | PREFILLED_SYRINGE | INTRAMUSCULAR | Status: DC
  Filled 2019-02-04: qty 0.3

## 2019-02-04 MED ORDER — ALBUTEROL SULFATE (2.5 MG/3ML) 0.083% IN NEBU: 2.5000 mg | INHALATION_SOLUTION | RESPIRATORY_TRACT | Status: DC | PRN

## 2019-02-04 MED ORDER — CHLORHEXIDINE GLUCONATE CLOTH 2 % EX PADS
6.0000 | MEDICATED_PAD | Freq: Every day | CUTANEOUS | Status: DC
  Administered 2019-02-05: 02:00:00 6 via TOPICAL

## 2019-02-04 MED ORDER — CALCITRIOL 0.5 MCG PO CAPS
0.7500 ug | ORAL_CAPSULE | ORAL | Status: DC
  Filled 2019-02-04: qty 3

## 2019-02-04 MED ORDER — PROPOFOL 1000 MG/100ML IV EMUL
5.0000 ug/kg/min | INTRAVENOUS | Status: DC
  Administered 2019-02-04: 06:00:00 60 ug/kg/min via INTRAVENOUS
  Administered 2019-02-04: 11:00:00 30 ug/kg/min via INTRAVENOUS
  Administered 2019-02-04: 19:00:00 40 ug/kg/min via INTRAVENOUS
  Administered 2019-02-04: 23:00:00 70 ug/kg/min via INTRAVENOUS
  Administered 2019-02-05: 74 ug/kg/min via INTRAVENOUS
  Administered 2019-02-05: 12:00:00 55 ug/kg/min via INTRAVENOUS
  Administered 2019-02-05: 07:00:00 70 ug/kg/min via INTRAVENOUS
  Filled 2019-02-04 (×6): qty 100

## 2019-02-04 MED ORDER — DEXTROSE 50 % IV SOLN
INTRAVENOUS | Status: AC
  Filled 2019-02-04: qty 50

## 2019-02-04 MED ORDER — VANCOMYCIN HCL IN DEXTROSE 1-5 GM/200ML-% IV SOLN
1000.0000 mg | Freq: Once | INTRAVENOUS | Status: AC
  Administered 2019-02-04: 08:00:00 1000 mg via INTRAVENOUS
  Filled 2019-02-04: qty 200

## 2019-02-04 MED ORDER — INSULIN ASPART 100 UNIT/ML ~~LOC~~ SOLN
0.0000 [IU] | SUBCUTANEOUS | Status: DC
  Administered 2019-02-04: 06:00:00 3 [IU] via SUBCUTANEOUS
  Administered 2019-02-04 – 2019-02-06 (×5): 1 [IU] via SUBCUTANEOUS

## 2019-02-04 MED ORDER — DEXTROSE 5 % IV SOLN
0.5000 g | Freq: Two times a day (BID) | INTRAVENOUS | Status: DC
  Administered 2019-02-04 – 2019-02-07 (×6): 0.5 g via INTRAVENOUS
  Filled 2019-02-04 (×11): qty 0.5

## 2019-02-04 MED ORDER — CHLORHEXIDINE GLUCONATE CLOTH 2 % EX PADS
6.0000 | MEDICATED_PAD | Freq: Every day | CUTANEOUS | Status: DC
  Administered 2019-02-04 – 2019-02-06 (×3): 6 via TOPICAL

## 2019-02-04 MED ORDER — DEXTROSE 50 % IV SOLN
12.5000 g | INTRAVENOUS | Status: AC
  Administered 2019-02-04: 12:00:00 12.5 g via INTRAVENOUS
  Filled 2019-02-04: qty 50

## 2019-02-04 MED ORDER — PROPOFOL 1000 MG/100ML IV EMUL
INTRAVENOUS | Status: AC
  Filled 2019-02-04: qty 100

## 2019-02-04 MED ORDER — HEPARIN SODIUM (PORCINE) 5000 UNIT/ML IJ SOLN
5000.0000 [IU] | Freq: Three times a day (TID) | INTRAMUSCULAR | Status: DC
  Administered 2019-02-04 – 2019-02-07 (×10): 5000 [IU] via SUBCUTANEOUS
  Filled 2019-02-04 (×9): qty 1

## 2019-02-04 NOTE — H&P (Signed)
NAME:  Christina [name], MRN:  [id number], DOB:  November 09, 1950, LOS: 0 ADMISSION DATE:  02/04/2019, CONSULTATION DATE:  02/04/19 REFERRING MD:  La[facility] ED, CHIEF COMPLAINT:  Acute respiratory insufficiency   Brief History   69 yo F with a history of COPD, CHF, ESRD who presented with acute respiratory distress, intubated quickly on arrival to the ED.   History of present illness   History is very limited due to patient's intubated status.  It appears that EMS was called to her house of shortness of breath.  On arrival to the ED, patient was agitated and uncooperative with trying to start BIPAP and decision was made to intubate.  Since then on my exam, she is heavily sedated with propofol and satting 100% on minimal vent settings- PRVC TV 400,FIO2 40%, PEEP 5.    Patient was recently discharged from [facility] on 1/2 after she was admitted for Afib with RVR.  She has a history of paroxysmal Afib and sees cardiology at [facility], is not on anticoagulation.   Past Medical History   Past Medical History:  Diagnosis Date  . Anemia   . Anxiety   . Arthritis   . Asthma   . Atrial fibrillation ([place])   . Chronic kidney disease    Dialysis T/Th/Sa  started in March 2020  . COPD (chronic obstructive pulmonary disease) ([place])   . Coronary artery disease    2 stents  . Depression   . GERD (gastroesophageal reflux disease)   . Gout   . Headache    migraines  . History of kidney stones   . Hyperlipidemia   . Hypertension   . Pneumonia   . Small bowel obstruction ([place])      Significant Hospital Events   Intubated in ED 1/8  Consults:  Nephrology  Procedures:  Intubation 1/8  Significant Diagnostic Tests:  1/8 CXR: reviewed.  IMPRESSION: 1. Endotracheal tube above the carina and enteric tube with tip beyond the inferior margin of the image. 2. Cardiomegaly with mild edema. Developing pneumonia is not Excluded.  BNP >4,000 (prior 1,500)  Micro Data:  Blood cultures 1/8 K+ 5.8  Antimicrobials:   Vancomycin and Aztreonam 1/8  Interim history/subjective:    Objective   Blood pressure (!) 146/67, pulse 76, temperature 99.2 F (37.3 C), resp. rate 18, SpO2 100 %.    Vent Mode: PRVC FiO2 (%):  [40 %-100 %] 40 % Set Rate:  [18 bmp-22 bmp] 22 bmp Vt Set:  [400 mL] 400 mL PEEP:  [5 cmH20] 5 cmH20 Plateau Pressure:  [22 cmH20] 22 cmH20  No intake or output data in the 24 hours ending 02/04/19 0507 There were no vitals filed for this visit.  Examination: General: Intubated and sedated, unresponsive. HENT: conjunctival edema bilaterally, pupils equal and reactive.  Lungs: bilateral rhonchi throughout.  Cardiovascular: regular rate and rhythm. Mildly hypertensive.  Abdomen: soft, +BS, nondistended. Extremities: trace edema bilaterally Neuro: unable to assess- heavily sedated GU: no abnormalities  Resolved Hospital Problem list     Assessment & Plan:  #Acute respiratory insufficiency #COPD #Diastolic heart failure  Intubated in the ED soon after arrival for respiratory distress and inability to tolerate BiPAP.  Looks slightly volume up on chest x-ray, with questionable multifocal pneumonia versus aspiration pneumonitis.  BNP also elevated, though this is difficult to interpret in an ESRD patient. -Will cover broadly with vancomycin and aztreonam (patient is allergic to penicillins).  Blood cultures, trach aspirate cultures.  If negative can DC antibiotics in  48 hours -Continue vent management: PRVC, FiO2 40%.  Tidal volume 6 to 8 cc/kg ideal body weight.  Repeat ABG looked good -SBT in the morning -[place], Pulmicort nebs with as needed albuterol nebs.  Currently do not suspect COPD exacerbation and therefore will hold off on prednisone. -RASS of 0 to -1 with propofol and as needed fentanyl  #ESRD: Volume up and hyperkalemic on admission.  It seems like she normally gets dialysis Tuesday Thursday Saturday. -Nephrology consult for HD.  #Paroxysmal A. fib: Patient currently  not on anticoagulation due to frequent falls, and ESRD baseline.  Currently in sinus rhythm. -Can start back metoprolol in the a.m. as long as she remains hemodynamically stable.  #Diabetes: Sliding scale insulin  Best practice:  Diet: N.p.o. Pain/Anxiety/Delirium protocol (if indicated): Ordered VAP protocol (if indicated): Yes DVT prophylaxis: Subq heparin GI prophylaxis: PPI Glucose control: SSI Mobility: When able Code Status: Full code for now.  Of note patient does have a DNR from prior admission that will need to be readdressed with the family Family Communication: Disposition: ICU  Labs   CBC: Recent Labs  Lab 02/04/19 0102 02/04/19 0235  WBC 19.2*  --   NEUTROABS 17.5*  --   HGB 12.1 11.9*  HCT 37.5 35.0*  MCV 97.4  --   PLT 230  --     Basic Metabolic Panel: Recent Labs  Lab 02/04/19 0102 02/04/19 0122 02/04/19 0235  NA 138  --  136  K 5.9*  --  5.8*  CL 103  --   --   CO2 22  --   --   GLUCOSE 171*  --   --   BUN 53*  --   --   CREATININE 6.21*  --   --   CALCIUM 7.8*  --   --   MG  --  1.9  --    GFR: Estimated Creatinine Clearance: 6.7 mL/min (A) (by C-G formula based on SCr of 6.21 mg/dL (H)). Recent Labs  Lab 02/04/19 0102  WBC 19.2*    Liver Function Tests: Recent Labs  Lab 02/04/19 0102  AST 29  ALT 25  ALKPHOS 62  BILITOT 0.7  PROT 6.4*  ALBUMIN 3.2*   No results for input(s): LIPASE, AMYLASE in the last 168 hours. No results for input(s): AMMONIA in the last 168 hours.  ABG    Component Value Date/Time   PHART 7.202 (L) 02/04/2019 0235   PCO2ART 65.4 (HH) 02/04/2019 0235   PO2ART 481.0 (H) 02/04/2019 0235   HCO3 25.7 02/04/2019 0235   TCO2 28 02/04/2019 0235   ACIDBASEDEF 3.0 (H) 02/04/2019 0235   O2SAT 100.0 02/04/2019 0235     Coagulation Profile: No results for input(s): INR, PROTIME in the last 168 hours.  Cardiac Enzymes: No results for input(s): CKTOTAL, CKMB, CKMBINDEX, TROPONINI in the last 168  hours.  HbA1C: Hgb A1c MFr Bld  Date/Time Value Ref Range Status  04/01/2008 02:30 AM  4.6 - 6.1 % Final   6.0 (NOTE)   The ADA recommends the following therapeutic goal for glycemic   control related to Hgb A1C measurement:   Goal of Therapy:   < 7.0% Hgb A1C   Reference: American Diabetes Association: Clinical Practice   Recommendations 2008, Diabetes Care,  2008, 31:(Suppl 1).    CBG: No results for input(s): GLUCAP in the last 168 hours.  Review of Systems:   ROS unable to be obtained due to patient's intubated and sedated status  Past Medical History  She,  has a past medical history of Anemia, Anxiety, Arthritis, Asthma, Atrial fibrillation (HCC), Chronic kidney disease, COPD (chronic obstructive pulmonary disease) ([place]), Coronary artery disease, Depression, GERD (gastroesophageal reflux disease), Gout, Headache, History of kidney stones, Hyperlipidemia, Hypertension, Pneumonia, and Small bowel obstruction ([place]).   Surgical History    Past Surgical History:  Procedure Laterality Date  . ABDOMINAL HYSTERECTOMY    . ABDOMINAL SURGERY     for small bowel obstruction - x 2  . APPENDECTOMY    . AV FISTULA PLACEMENT Left 08/04/2018   Procedure: ARTERIOVENOUS (AV) FISTULA CREATION LEFT ARM;  Surgeon: Waynetta Sandy, MD;  Location: [facility];  Service: Vascular;  Laterality: Left;  . BASCILIC VEIN TRANSPOSITION Left 11/24/2018   Procedure: SECOND STAGE BASILIC VEIN TRANSPOSITION LEFT ARM;  Surgeon: Waynetta Sandy, MD;  Location: [facility];  Service: Vascular;  Laterality: Left;  . CARDIAC CATHETERIZATION    . CORONARY ANGIOPLASTY  ?2003/2004  . FACIAL RECONSTRUCTION SURGERY     x 2  . HERNIA REPAIR       Social History   reports that she has quit smoking. She has never used smokeless tobacco. She reports previous alcohol use. She reports previous drug use. Drug: Marijuana.   Family History   Her family history is not on file.   Allergies Allergies  Allergen  Reactions  . Cyclobenzaprine Anaphylaxis and Other (See Comments)    "stopped heart"   . Morphine Anaphylaxis    "stopped heart"  . Penicillins Shortness Of Breath, Swelling and Palpitations    Did it involve swelling of the face/tongue/throat, SOB, or low BP? Yes Did it involve sudden or severe rash/hives, skin peeling, or any reaction on the inside of your mouth or nose? No Did you need to seek medical attention at a hospital or doctor's office? No When did it last happen?years  If all above answers are "NO", may proceed with cephalosporin use.   Marland Kitchen Hydromorphone Other (See Comments)    If administered quickly, felt like hand was "exploding"   . Codeine Itching and Rash     Home Medications  Prior to Admission medications   Medication Sig Start Date End Date Taking? Authorizing Provider  albuterol (PROVENTIL) (2.5 MG/3ML) 0.083% nebulizer solution Take 2.5 mg by nebulization every 6 (six) hours as needed for wheezing or shortness of breath.    [provider]  allopurinol (ZYLOPRIM) 100 MG tablet Take 100 mg by mouth daily. 02/18/18   [provider]  aspirin EC 81 MG tablet Take 81 mg by mouth daily.    [provider]  AURYXIA 1 GM 210 MG(Fe) tablet Take 420 mg by mouth See admin instructions. Take 3 tablets (630 mg) by mouth with each meal & take 2 tablets (420 mg) by mouth with each snack 08/16/18   [provider]  Carboxymethylcellul-Glycerin (LUBRICATING EYE DROPS OP) Place 1 drop into both eyes daily as needed (dry eyes).    [provider]  Cholecalciferol (VITAMIN D3) 50 MCG (2000 UT) capsule Take 2,000 Units by mouth daily.     [provider]  dicyclomine (BENTYL) 10 MG capsule Take 20 mg by mouth at bedtime.  08/27/17   [provider]  docusate sodium (COLACE) 100 MG capsule Take 1 capsule (100 mg total) by mouth 2 (two) times daily. 08/11/18   Hongalgi, Lenis Dickinson, MD  doxylamine, Sleep, (UNISOM) 25 MG tablet  Take 25 mg by mouth at bedtime as needed for sleep.  [provider]  furosemide (LASIX) 80 MG tablet Take 80 mg by mouth See admin instructions. Take one tablet on TUE THUR SAT before dialysis. 10/21/18   [provider]  gabapentin (NEURONTIN) 300 MG capsule Take 600 mg by mouth at bedtime as needed (leg pain.).  08/17/18   [provider]  Ipratropium-Albuterol (COMBIVENT) 20-100 MCG/ACT AERS respimat Inhale 2 puffs into the lungs 4 (four) times daily as needed for wheezing. 09/11/17   [provider]  isosorbide mononitrate (IMDUR) 30 MG 24 hr tablet Take 30 mg by mouth daily. 08/17/18   [provider]  lidocaine-prilocaine (EMLA) cream Apply 1 application topically daily as needed (1-2 hours before dialysis (cover with occlusive dressing)).  09/03/18   [provider]  metoprolol tartrate (LOPRESSOR) 100 MG tablet Take 1 tablet (100 mg total) by mouth 2 (two) times daily. 01/26/19   Little Ishikawa, MD  nitroGLYCERIN (NITROSTAT) 0.4 MG SL tablet Place 0.4 mg under the tongue every 5 (five) minutes as needed for chest pain.  09/18/16   [provider]  ondansetron (ZOFRAN) 4 MG tablet Take 4 mg by mouth 3 (three) times daily as needed for nausea. 09/14/18   [provider]  pantoprazole (PROTONIX) 40 MG tablet Take 1 tablet (40 mg total) by mouth daily. Patient taking differently: Take 40 mg by mouth daily as needed (acid reflux/indigestion.).  08/11/18   Hongalgi, Lenis Dickinson, MD  polyethylene glycol (MIRALAX / GLYCOLAX) 17 g packet Take 17 g by mouth 2 (two) times daily. Patient taking differently: Take 17 g by mouth every other day.  08/11/18   Hongalgi, Lenis Dickinson, MD  pravastatin (PRAVACHOL) 80 MG tablet Take 80 mg by mouth every evening.  06/30/16   [provider]  sennosides-docusate sodium (SENOKOT-S) 8.6-50 MG tablet Take 1 tablet by mouth 2 (two) times daily.     [provider]  SYMBICORT 160-4.5 MCG/ACT  inhaler Inhale 2 puffs into the lungs every 4 (four) hours as needed for shortness of breath or wheezing. 09/16/18   [provider]     Critical care time: 55 minutes

## 2019-02-04 NOTE — Progress Notes (Signed)
Hypoglycemic Event  CBG: 45  Treatment: 1amp dexterose  Symptoms: none  Follow-up CBG: Time: 140  Possible Reasons for Event: n/a  Comments/MD notified: Critical care ordered tube feeds     Christina [name] Christina [name]

## 2019-02-04 NOTE — ED Provider Notes (Signed)
MSE was initiated and I personally evaluated the patient and placed orders (if any) at  1:27 AM on January 71, 5539.  69 year old female with a history of COPD, CHF, and end-stage renal disease presenting by EMS from home with respiratory distress.  EMS reports that her symptoms began around noon.  She was satting at 86% on room air with EMS in route. HR in the 92 with BP 160/100.  She was recently admitted on December 30 for A. fib with RVR.  She is about to start hemodialysis and was seen by vascular surgery on 1/6.   On arrival to the ER, the patient is thrashing all 4 extremities about on the bed. Abdomen is distended. Extremities are mottled and cool. She has a rhoncherous cough and able to speak 1-2 words at a time.   The patient was promptly evaluated by Dr. Roxanne Mins, attending physician who performed endotracheal intubation at bedside and assumed care of the patient.    Joanne Gavel, PA-C [id number]    Delora Fuel, MD 15/04/13 0700

## 2019-02-04 NOTE — Consult Note (Signed)
[facility] Renal Consultation Note    Indication for Consultation:  Management of ESRD/hemodialysis, anemia, hypertension/volume, and secondary hyperparathyroidism. PCP:  HPI: Christina [name] is a 69 y.o. female with ESRD, HTN, COPD, CAD (s/p stents), A-fib, and GERD who was admitted with acute respiratory failure requiring ventilation.  Pt currently intubated on vent - unable to provide Hx. Per notes, brought to ED from home via [facility] for dyspnea/respiratory distress. She was given neb treatments without improvement and was combative. O2 sat 86% on room air and intolerant of BiPAP. She was urgently intubated. Initial labs showed Na 138, K 5.9, Glu 171, BNP high, WBC 19.2, Hgb 12.1, trop 35. Flu/COVID negative. CXR showed cardiomegaly and ^ vascular congestion. BCx drawn and she was started empirically on Vancomycin/Aztreonam. She has required propofol for sedation. OG tube is in place with mild coffee ground appearance.  Recently D/c from [facility] on 01/25/29 with A-fib RVR.   Dialyzes on TTS schedule in [place]. Last HD was 02/01/19 which she completed in entirety and met her dry weight, although BP was high throughout her treatment. She had f/u with [facility] on 02/02/19 and was cleared to begin cannulation of her AVF.  Past Medical History:  Diagnosis Date  . Anemia   . Anxiety   . Arthritis   . Asthma   . Atrial fibrillation ([place])   . Chronic kidney disease    Dialysis T/Th/Sa  started in March 2020  . COPD (chronic obstructive pulmonary disease) ([place])   . Coronary artery disease    2 stents  . Depression   . GERD (gastroesophageal reflux disease)   . Gout   . Headache    migraines  . History of kidney stones   . Hyperlipidemia   . Hypertension   . Pneumonia   . Small bowel obstruction [facility])    Past Surgical History:  Procedure Laterality Date  . ABDOMINAL HYSTERECTOMY    . ABDOMINAL SURGERY     for small bowel obstruction - x 2  . APPENDECTOMY    . AV  FISTULA PLACEMENT Left 08/04/2018   Procedure: ARTERIOVENOUS (AV) FISTULA CREATION LEFT ARM;  Surgeon: Waynetta Sandy, MD;  Location: [place];  Service: Vascular;  Laterality: Left;  . BASCILIC VEIN TRANSPOSITION Left 11/24/2018   Procedure: SECOND STAGE BASILIC VEIN TRANSPOSITION LEFT ARM;  Surgeon: Waynetta Sandy, MD;  Location: [place];  Service: Vascular;  Laterality: Left;  . CARDIAC CATHETERIZATION    . CORONARY ANGIOPLASTY  ?2003/2004  . FACIAL RECONSTRUCTION SURGERY     x 2  . HERNIA REPAIR     No family history on file. Social History:  reports that she has quit smoking. She has never used smokeless tobacco. She reports previous alcohol use. She reports previous drug use. Drug: Marijuana.  ROS: Unable to provide, intubated/vented/sedated.  Physical Exam: Vitals:   02/04/19 0600 02/04/19 0605 02/04/19 0655 02/04/19 0831  BP: (!) 150/62 (!) 143/60 140/64   Pulse: 75 74 69 75  Resp: (!) 22 (!) 22 12 (!) 32  Temp: 98.1 F (36.7 C) 98 F (36.7 C) 97.9 F (36.6 C)   TempSrc:   Bladder   SpO2: 100% 100% 100%      General: Frail appearing woman, NAD. Intubated/vented. OG tube in place. Head: Normocephalic, atraumatic. Eyelid edema present. Neck: Supple without lymphadenopathy/masses. Lungs: Rhonchorous throughout. Heart: RRR; no murmur Abdomen: Soft, non-tender, non-distended with normoactive bowel sounds.  Lower extremities: No edema or ischemic changes, no open wounds.  Neuro: Intubated, unable to assess Psych:  Intubated, unable to assess Dialysis Access: TDC + LUE AVF + thrill  Allergies  Allergen Reactions  . Cyclobenzaprine Anaphylaxis and Other (See Comments)    "stopped heart"   . Morphine Anaphylaxis    "stopped heart"  . Penicillins Shortness Of Breath, Swelling and Palpitations    Did it involve swelling of the face/tongue/throat, SOB, or low BP? Yes Did it involve sudden or severe rash/hives, skin peeling, or any reaction on the inside of  your mouth or nose? No Did you need to seek medical attention at a hospital or doctor's office? No When did it last happen?years  If all above answers are "NO", may proceed with cephalosporin use.   Marland Kitchen Hydromorphone Other (See Comments)    If administered quickly, felt like hand was "exploding"   . Codeine Itching and Rash   Prior to Admission medications   Medication Sig Start Date End Date Taking? Authorizing Provider  albuterol (PROVENTIL) (2.5 MG/3ML) 0.083% nebulizer solution Take 2.5 mg by nebulization every 6 (six) hours as needed for wheezing or shortness of breath.    [provider]  allopurinol (ZYLOPRIM) 100 MG tablet Take 100 mg by mouth daily. 02/18/18   [provider]  aspirin EC 81 MG tablet Take 81 mg by mouth daily.    [provider]  AURYXIA 1 GM 210 MG(Fe) tablet Take 420 mg by mouth See admin instructions. Take 3 tablets (630 mg) by mouth with each meal & take 2 tablets (420 mg) by mouth with each snack 08/16/18   [provider]  Carboxymethylcellul-Glycerin (LUBRICATING EYE DROPS OP) Place 1 drop into both eyes daily as needed (dry eyes).    [provider]  Cholecalciferol (VITAMIN D3) 50 MCG (2000 UT) capsule Take 2,000 Units by mouth daily.     [provider]  dicyclomine (BENTYL) 10 MG capsule Take 20 mg by mouth at bedtime.  08/27/17   [provider]  docusate sodium (COLACE) 100 MG capsule Take 1 capsule (100 mg total) by mouth 2 (two) times daily. 08/11/18   Hongalgi, Lenis Dickinson, MD  doxylamine, Sleep, (UNISOM) 25 MG tablet Take 25 mg by mouth at bedtime as needed for sleep.    [provider]  furosemide (LASIX) 80 MG tablet Take 80 mg by mouth See admin instructions. Take one tablet on TUE THUR SAT before dialysis. 10/21/18   [provider]  gabapentin (NEURONTIN) 300 MG capsule Take 600 mg by mouth at bedtime as needed (leg pain.).  08/17/18   [provider]   Ipratropium-Albuterol (COMBIVENT) 20-100 MCG/ACT AERS respimat Inhale 2 puffs into the lungs 4 (four) times daily as needed for wheezing. 09/11/17   [provider]  isosorbide mononitrate (IMDUR) 30 MG 24 hr tablet Take 30 mg by mouth daily. 08/17/18   [provider]  lidocaine-prilocaine (EMLA) cream Apply 1 application topically daily as needed (1-2 hours before dialysis (cover with occlusive dressing)).  09/03/18   [provider]  metoprolol tartrate (LOPRESSOR) 100 MG tablet Take 1 tablet (100 mg total) by mouth 2 (two) times daily. 01/26/19   Little Ishikawa, MD  nitroGLYCERIN (NITROSTAT) 0.4 MG SL tablet Place 0.4 mg under the tongue every 5 (five) minutes as needed for chest pain.  09/18/16   [provider]  ondansetron (ZOFRAN) 4 MG tablet Take 4 mg by mouth 3 (three) times daily as needed for nausea. 09/14/18   [provider]  pantoprazole (  PROTONIX) 40 MG tablet Take 1 tablet (40 mg total) by mouth daily. Patient taking differently: Take 40 mg by mouth daily as needed (acid reflux/indigestion.).  08/11/18   Hongalgi, Lenis Dickinson, MD  polyethylene glycol (MIRALAX / GLYCOLAX) 17 g packet Take 17 g by mouth 2 (two) times daily. Patient taking differently: Take 17 g by mouth every other day.  08/11/18   Hongalgi, Lenis Dickinson, MD  pravastatin (PRAVACHOL) 80 MG tablet Take 80 mg by mouth every evening.  06/30/16   [provider]  sennosides-docusate sodium (SENOKOT-S) 8.6-50 MG tablet Take 1 tablet by mouth 2 (two) times daily.     [provider]  SYMBICORT 160-4.5 MCG/ACT inhaler Inhale 2 puffs into the lungs every 4 (four) hours as needed for shortness of breath or wheezing. 09/16/18   [provider]   Current Facility-Administered Medications  Medication Dose Route Frequency Provider Last Rate Last Admin  . albuterol (PROVENTIL) (2.5 MG/3ML) 0.083% nebulizer solution 2.5 mg  2.5 mg Nebulization Q3H PRN Desai, Rahul P, PA-C       . arformoterol (BROVANA) nebulizer solution 15 mcg  15 mcg Nebulization BID Shearon Stalls, Rahul P, PA-C   15 mcg at 02/04/19 2330  . aspirin chewable tablet 81 mg  81 mg Per Tube Daily Desai, Rahul P, PA-C      . aztreonam (AZACTAM) 0.5 g in dextrose 5 % 50 mL IVPB  0.5 g Intravenous Q12H Laren Everts, RPH 100 mL/hr at 02/04/19 0951 0.5 g at 02/04/19 0951  . budesonide (PULMICORT) nebulizer solution 0.5 mg  0.5 mg Nebulization BID Shearon Stalls, Rahul P, PA-C   0.5 mg at 02/04/19 0762  . chlorhexidine gluconate (MEDLINE KIT) (PERIDEX) 0.12 % solution 15 mL  15 mL Mouth Rinse BID Lannan, Margo T, MD      . Chlorhexidine Gluconate Cloth 2 % PADS 6 each  6 each Topical Daily Lannan, Margo T, MD      . Chlorhexidine Gluconate Cloth 2 % PADS 6 each  6 each Topical Q0600 Glenora Morocho R, PA-C      . dextrose 50 % solution           . feeding supplement (PRO-STAT SUGAR FREE 64) liquid 30 mL  30 mL Per Tube BID Agarwala, Einar Grad, MD      . feeding supplement (VITAL HIGH PROTEIN) liquid 1,000 mL  1,000 mL Per Tube Q24H Agarwala, Ravi, MD      . fentaNYL (SUBLIMAZE) injection 25 mcg  25 mcg Intravenous Q15 min PRN Desai, Rahul P, PA-C      . fentaNYL (SUBLIMAZE) injection 25-100 mcg  25-100 mcg Intravenous Q30 min PRN Desai, Rahul P, PA-C      . heparin injection 5,000 Units  5,000 Units Subcutaneous Q8H Desai, Rahul P, PA-C   5,000 Units at 02/04/19 2633  . insulin aspart (novoLOG) injection 0-9 Units  0-9 Units Subcutaneous Q4H Desai, Rahul P, PA-C   3 Units at 02/04/19 0542  . ipratropium (ATROVENT) nebulizer solution 0.5 mg  0.5 mg Nebulization Q6H Desai, Rahul P, PA-C   0.5 mg at 02/04/19 0828  . MEDLINE mouth rinse  15 mL Mouth Rinse 10 times per day Daryll Brod T, MD      . pantoprazole (PROTONIX) injection 40 mg  40 mg Intravenous QHS Desai, Rahul P, PA-C      . propofol (DIPRIVAN) 1000 MG/100ML infusion  5-80 mcg/kg/min Intravenous Continuous Delora Fuel, MD 35.45 mL/hr at 02/04/19 0555 60 mcg/kg/min at  02/04/19  4268  Derrill Memo ON 02/05/2019] vancomycin (VANCOCIN) IVPB 500 mg/100 ml premix  500 mg Intravenous Q T,Th,Sa-HD Laren Everts, [facility]       Labs: Basic Metabolic Panel: Recent Labs  Lab 02/04/19 0102 02/04/19 0235 02/04/19 0527  NA 138 136 136  K 5.9* 5.8* 4.7  CL 103  --   --   CO2 22  --   --   GLUCOSE 171*  --   --   BUN 53*  --   --   CREATININE 6.21*  --   --   CALCIUM 7.8*  --   --    Liver Function Tests: Recent Labs  Lab 02/04/19 0102  AST 29  ALT 25  ALKPHOS 62  BILITOT 0.7  PROT 6.4*  ALBUMIN 3.2*   CBC: Recent Labs  Lab 02/04/19 0102 02/04/19 0235 02/04/19 0527  WBC 19.2*  --   --   NEUTROABS 17.5*  --   --   HGB 12.1 11.9* 10.9*  HCT 37.5 35.0* 32.0*  MCV 97.4  --   --   PLT 230  --   --    Studies/Results: DG Chest Portable 1 View  Result Date: 02/04/2019 CLINICAL DATA:  69 year old female status post intubation. EXAM: PORTABLE CHEST 1 VIEW COMPARISON:  Chest radiograph dated 01/24/2019. FINDINGS: Right-sided dialysis catheter in stable position. Endotracheal tube with tip approximately 4 cm above the carina and enteric tube extending inferior to the diaphragm with tip beyond the inferior margin of the image. There is mild cardiomegaly with probable vascular congestion and mild edema. Developing pneumonia is not excluded. Clinical correlation is recommended. A small left pleural effusion may be present. No pneumothorax. Atherosclerotic calcification of the aorta. No acute osseous pathology. IMPRESSION: 1. Endotracheal tube above the carina and enteric tube with tip beyond the inferior margin of the image. 2. Cardiomegaly with mild edema. Developing pneumonia is not excluded. Electronically Signed   By: Anner Crete M.D.   On: 02/04/2019 01:32   Dialysis Orders:  TTS at [facility]  ---> last HD 02/01/19, full treatment, post-weight 46.9kg 4hr, 400/A1.5, EDW 47kg, 2K/2Ca, UFP #4, TDC + maturing LUE AVF - Calcitriol 0.65mg PO q  HD  Assessment/Plan: 1.  Acute respiratory failure: S/p urgent intubation in ED. CXR with pulm edema. Covering for infectious process. COVID-19 negative. Will plan for dialysis today for volume. 2.  Hyperkalemia: On admit - improved with D50/insulin + IV Ca. HD today to further correct. 3.  ESRD: Usual TTS sched. Missed HD yesterday - last was 1/5. HD today, then back to usual TTS sched. 4.  Hypertension/volume: BP reasonable - UF as tolerated. 5.  Anemia: Hgb 10.9 - trending down. Will plan to give ESA tomorrow. 6.  Metabolic bone disease: CorrCa ok - Phos pending. Resume calcitriol. 7.  COPD 8.  CAD 9.  Paroxysmal A-fib: S/p recent admit for the following. NSR on initial EKG.  KVeneta Penton PA-C 02/04/2019, 10:11 AM  [facility] Pager: [phone]

## 2019-02-04 NOTE — ED Triage Notes (Signed)
Pt arrives via [facility] fro home.  Pt called out for respiratory distress. RA spo2 86%. Pt given Albuterol, duoneb, and cpap attempted. Pt uncooperative with EMS, ripping off oxygen, not answering questions and only stating "help me" on arrival.  Hx COPD and CHF

## 2019-02-04 NOTE — Progress Notes (Signed)
Initial Nutrition Assessment  DOCUMENTATION CODES:   Not applicable  INTERVENTION:   D/C Vital High Protein and Prostat  Initiate Vital AF 1.2 @ 40 ml/hr  Provides: 1152 kcal, 72 grams protein, and 778 ml free water.   TF regimen and propofol at current rate providing 1468 total kcal/day   NUTRITION DIAGNOSIS:   Increased nutrient needs related to (ESRD on HD) as evidenced by estimated needs.  GOAL:   Patient will meet greater than or equal to 90% of their needs  MONITOR:   TF tolerance, I & O's  REASON FOR ASSESSMENT:   Consult, Ventilator Enteral/tube feeding initiation and management  ASSESSMENT:   Pt with PMH of COPD, CHF, ESRD on HD TTS, new afib recently d/c'ed from [facility] on 1/2 admitted with SOB and intubated.    Patient is currently intubated on ventilator support MV: 11.2 L/min Temp (24hrs), Avg:98.7 F (37.1 C), Min:97.2 F (36.2 C), Max:100.1 F (37.8 C)  Propofol: 12 ml/hr (40 mcg) provides: 316 kcal  Medications reviewed and include: SSI Labs reviewed:  CBG's: 140-55-151 PO4/K/Mg WNL  TF: Vital High Protein @ 40 ml/hr with 30 ml Prostat BID Provides: 1160 kcal and 114 grams protein  NUTRITION - FOCUSED PHYSICAL EXAM:  Deferred   Diet Order:   Diet Order            Diet NPO time specified  Diet effective now              EDUCATION NEEDS:   No education needs have been identified at this time  Skin:  Skin Assessment: Reviewed RN Assessment  Last BM:  1/1  Height:   Ht Readings from Last 1 Encounters:  02/02/19 5\' 2"  (1.575 m)    Weight:   Wt Readings from Last 1 Encounters:  02/02/19 49 kg    Ideal Body Weight:  50 kg  BMI:  There is no height or weight on file to calculate BMI.  Estimated Nutritional Needs:   Kcal:  7654  Protein:  65-75 grams  Fluid:  1.2 L/day  Maylon Peppers RD, LDN, CNSC [phone] Pager [phone] After Hours Pager

## 2019-02-04 NOTE — ED Provider Notes (Signed)
[facility] EMERGENCY DEPARTMENT Provider Note   CSN: [id number] Arrival date & time: 02/04/19  0057   History Chief Complaint  Patient presents with  . Respiratory Distress    Christina [name] is a 69 y.o. female.  The history is provided by the EMS personnel. The history is limited by the condition of the patient (Severe respiratory distress).  She has history of hypertension, hyperlipidemia, atrial fibrillation, coronary artery disease, COPD, end-stage renal disease on hemodialysis and is brought in by ambulance because of respiratory distress.  EMS reports difficulty breathing started about noon.  They noted oxygen saturation of 86% on room air.  She was uncooperative with attempts to give nebulizer treatments or started on BiPAP.  Past Medical History:  Diagnosis Date  . Anemia   . Anxiety   . Arthritis   . Asthma   . Atrial fibrillation ([place])   . Chronic kidney disease    Dialysis T/Th/Sa  started in March 2020  . COPD (chronic obstructive pulmonary disease) ([place])   . Coronary artery disease    2 stents  . Depression   . GERD (gastroesophageal reflux disease)   . Gout   . Headache    migraines  . History of kidney stones   . Hyperlipidemia   . Hypertension   . Pneumonia   . Small bowel obstruction [facility])     Patient Active Problem List   Diagnosis Date Noted  . Atrial fibrillation with RVR ([place]) 01/25/2019  . SBO (small bowel obstruction) ([place]) 08/10/2018  . Anemia due to chronic blood loss   . Heme positive stool   . Platelet inhibition due to Plavix   . Acute GI bleeding 05/06/2018  . ESRD (end stage renal disease) ([place]) 05/06/2018  . Anxiety 05/06/2018  . Bipolar affective ([place]) 05/06/2018  . CAD (coronary artery disease), native coronary artery 05/06/2018  . COPD with chronic bronchitis ([place]) 05/06/2018  . CVA (cerebral vascular accident) ([place]) 05/06/2018  . Essential hypertension 05/06/2018  . Hyperlipidemia 05/06/2018    Past  Surgical History:  Procedure Laterality Date  . ABDOMINAL HYSTERECTOMY    . ABDOMINAL SURGERY     for small bowel obstruction - x 2  . APPENDECTOMY    . AV FISTULA PLACEMENT Left 08/04/2018   Procedure: ARTERIOVENOUS (AV) FISTULA CREATION LEFT ARM;  Surgeon: Waynetta Sandy, MD;  Location: [place];  Service: Vascular;  Laterality: Left;  . BASCILIC VEIN TRANSPOSITION Left 11/24/2018   Procedure: SECOND STAGE BASILIC VEIN TRANSPOSITION LEFT ARM;  Surgeon: Waynetta Sandy, MD;  Location: [facility];  Service: Vascular;  Laterality: Left;  . CARDIAC CATHETERIZATION    . CORONARY ANGIOPLASTY  ?2003/2004  . FACIAL RECONSTRUCTION SURGERY     x 2  . HERNIA REPAIR       OB History   No obstetric history on file.     No family history on file.  Social History   Tobacco Use  . Smoking status: Former Research scientist (life sciences)  . Smokeless tobacco: Never Used  Substance Use Topics  . Alcohol use: Not Currently    Comment: quit about 6 months ago - "I was a drunk"  . Drug use: Not Currently    Types: Marijuana    Comment: h/o drug use - "I was raised in the 60s"; last use 18 months ago; smokes marijuana periodically    Home Medications Prior to Admission medications   Medication Sig Start Date End Date Taking? Authorizing Provider  albuterol (PROVENTIL) (2.5  MG/3ML) 0.083% nebulizer solution Take 2.5 mg by nebulization every 6 (six) hours as needed for wheezing or shortness of breath.    [provider]  allopurinol (ZYLOPRIM) 100 MG tablet Take 100 mg by mouth daily. 02/18/18   [provider]  aspirin EC 81 MG tablet Take 81 mg by mouth daily.    [provider]  AURYXIA 1 GM 210 MG(Fe) tablet Take 420 mg by mouth See admin instructions. Take 3 tablets (630 mg) by mouth with each meal & take 2 tablets (420 mg) by mouth with each snack 08/16/18   [provider]  Carboxymethylcellul-Glycerin (LUBRICATING EYE DROPS OP) Place 1 drop into both eyes daily as  needed (dry eyes).    [provider]  Cholecalciferol (VITAMIN D3) 50 MCG (2000 UT) capsule Take 2,000 Units by mouth daily.     [provider]  dicyclomine (BENTYL) 10 MG capsule Take 20 mg by mouth at bedtime.  08/27/17   [provider]  docusate sodium (COLACE) 100 MG capsule Take 1 capsule (100 mg total) by mouth 2 (two) times daily. 08/11/18   Hongalgi, Lenis Dickinson, MD  doxylamine, Sleep, (UNISOM) 25 MG tablet Take 25 mg by mouth at bedtime as needed for sleep.    [provider]  furosemide (LASIX) 80 MG tablet Take 80 mg by mouth See admin instructions. Take one tablet on TUE THUR SAT before dialysis. 10/21/18   [provider]  gabapentin (NEURONTIN) 300 MG capsule Take 600 mg by mouth at bedtime as needed (leg pain.).  08/17/18   [provider]  Ipratropium-Albuterol (COMBIVENT) 20-100 MCG/ACT AERS respimat Inhale 2 puffs into the lungs 4 (four) times daily as needed for wheezing. 09/11/17   [provider]  isosorbide mononitrate (IMDUR) 30 MG 24 hr tablet Take 30 mg by mouth daily. 08/17/18   [provider]  lidocaine-prilocaine (EMLA) cream Apply 1 application topically daily as needed (1-2 hours before dialysis (cover with occlusive dressing)).  09/03/18   [provider]  metoprolol tartrate (LOPRESSOR) 100 MG tablet Take 1 tablet (100 mg total) by mouth 2 (two) times daily. 01/26/19   Little Ishikawa, MD  nitroGLYCERIN (NITROSTAT) 0.4 MG SL tablet Place 0.4 mg under the tongue every 5 (five) minutes as needed for chest pain.  09/18/16   [provider]  ondansetron (ZOFRAN) 4 MG tablet Take 4 mg by mouth 3 (three) times daily as needed for nausea. 09/14/18   [provider]  pantoprazole (PROTONIX) 40 MG tablet Take 1 tablet (40 mg total) by mouth daily. Patient taking differently: Take 40 mg by mouth daily as needed (acid reflux/indigestion.).  08/11/18   Hongalgi, Lenis Dickinson, MD  polyethylene  glycol (MIRALAX / GLYCOLAX) 17 g packet Take 17 g by mouth 2 (two) times daily. Patient taking differently: Take 17 g by mouth every other day.  08/11/18   Hongalgi, Lenis Dickinson, MD  pravastatin (PRAVACHOL) 80 MG tablet Take 80 mg by mouth every evening.  06/30/16   [provider]  sennosides-docusate sodium (SENOKOT-S) 8.6-50 MG tablet Take 1 tablet by mouth 2 (two) times daily.     [provider]  SYMBICORT 160-4.5 MCG/ACT inhaler Inhale 2 puffs into the lungs every 4 (four) hours as needed for shortness of breath or wheezing. 09/16/18   [provider]    Allergies    Cyclobenzaprine, Morphine, Penicillins, Hydromorphone, and Codeine  Review of Systems   Review of Systems  Unable to perform  ROS: Severe respiratory distress    Physical Exam Updated Vital Signs BP (!) 149/127   Pulse (!) 41   SpO2 100%   Physical Exam Vitals and nursing note reviewed.   69 year old female in respiratory distress.  She alternates between periods of agitation and periods of preagonal respirations.  Vital signs are significant for elevated heart rate and blood pressure. Oxygen saturation is 86%, which is hypoxic. Head is normocephalic and atraumatic. PERRLA, EOMI. Oropharynx is clear. Neck is nontender and supple without adenopathy. JVD is present. Back is nontender and there is no CVA tenderness. Lungs have diminished airflow with coarse breath sounds throughout but no overt rales, wheezes, or rhonchi. Chest is nontender.  Dialysis access catheter is present in the right subclavian area. Heart is tachycardic rhythm without murmur. Abdomen is soft, flat, nontender without masses or hepatosplenomegaly and peristalsis is normoactive. Extremities have no cyanosis or edema, full range of motion is present.  AV fistula present in the left upper arm. Skin is warm and dry without rash. Neurologic: Alternating between agitated and poor responsiveness, cranial nerves are intact, there are  no motor or sensory deficits.  ED Results / Procedures / Treatments   Labs (all labs ordered are listed, but only abnormal results are displayed) Labs Reviewed  CBC WITH DIFFERENTIAL/PLATELET - Abnormal; Notable for the following components:      Result Value   WBC 19.2 (*)    RBC 3.85 (*)    Neutro Abs 17.5 (*)    Lymphs Abs 0.6 (*)    Abs Immature Granulocytes 0.29 (*)    All other components within normal limits  COMPREHENSIVE METABOLIC PANEL - Abnormal; Notable for the following components:   Potassium 5.9 (*)    Glucose, Bld 171 (*)    BUN 53 (*)    Creatinine, Ser 6.21 (*)    Calcium 7.8 (*)    Total Protein 6.4 (*)    Albumin 3.2 (*)    GFR calc non Af Amer 6 (*)    GFR calc Af Amer 7 (*)    All other components within normal limits  BRAIN NATRIURETIC PEPTIDE - Abnormal; Notable for the following components:   B Natriuretic Peptide 4,197.2 (*)    All other components within normal limits  POCT I-STAT 7, (LYTES, BLD GAS, ICA,H+H) - Abnormal; Notable for the following components:   pH, Arterial 7.202 (*)    pCO2 arterial 65.4 (*)    pO2, Arterial 481.0 (*)    Acid-base deficit 3.0 (*)    Potassium 5.8 (*)    Calcium, Ion 1.10 (*)    HCT 35.0 (*)    Hemoglobin 11.9 (*)    All other components within normal limits  TROPONIN I (HIGH SENSITIVITY) - Abnormal; Notable for the following components:   Troponin I (High Sensitivity) 35 (*)    All other components within normal limits  TROPONIN I (HIGH SENSITIVITY) - Abnormal; Notable for the following components:   Troponin I (High Sensitivity) 37 (*)    All other components within normal limits  RESPIRATORY PANEL BY RT PCR (FLU A&B, COVID)  CULTURE, BLOOD (ROUTINE X 2)  CULTURE, BLOOD (ROUTINE X 2)  URINE CULTURE  CULTURE, RESPIRATORY  MAGNESIUM  BLOOD GAS, ARTERIAL  HEMOGLOBIN A1C  I-STAT ARTERIAL BLOOD GAS, ED    EKG EKG Interpretation  Date/Time:  Friday February 04 2019 01:32:23 EST Ventricular Rate:  59 PR  Interval:    QRS Duration: 90 QT Interval:  448 QTC  Calculation: 444 R Axis:   82 Text Interpretation: Sinus rhythm Supraventricular bigeminy Borderline right axis deviation Borderline ST depression, diffuse leads When compared with ECG of 12/229/2020, Sinus rhythm has replaced Atrial fibrillation QT has shortened Confirmed by Delora Fuel ([id number]) on 02/04/2019 1:35:13 AM   Radiology No results found.  Procedures Procedure Name: Intubation Date/Time: 02/04/2019 1:10 AM Performed by: Delora Fuel, MD Pre-anesthesia Checklist: Patient identified, Patient being monitored, Emergency Drugs available, Timeout performed and Suction available Oxygen Delivery Method: Non-rebreather mask Preoxygenation: Pre-oxygenation with 100% oxygen Induction Type: Rapid sequence Ventilation: Mask ventilation without difficulty Laryngoscope Size: Glidescope and 3 Grade View: Grade I Tube size: 7.5 mm Number of attempts: 1 Airway Equipment and Method: Rigid stylet and Video-laryngoscopy Placement Confirmation: ETT inserted through vocal cords under direct vision,  CO2 detector and Breath sounds checked- equal and bilateral Secured at: 25 cm Tube secured with: ETT holder Dental Injury: Teeth and Oropharynx as per pre-operative assessment       CRITICAL CARE Performed by: Delora Fuel Total critical care time: 130 minutes Critical care time was exclusive of separately billable procedures and treating other patients. Critical care was necessary to treat or prevent imminent or life-threatening deterioration. Critical care was time spent personally by me on the following activities: development of treatment plan with patient and/or surrogate as well as nursing, discussions with consultants, evaluation of patient's response to treatment, examination of patient, obtaining history from patient or surrogate, ordering and performing treatments and interventions, ordering and review of laboratory studies, ordering and  review of radiographic studies, pulse oximetry and re-evaluation of patient's condition.  Medications Ordered in ED Medications  propofol (DIPRIVAN) 1000 MG/100ML infusion (60 mcg/kg/min  49 kg Intravenous Rate/Dose Change 02/04/19 0402)  heparin injection 5,000 Units (has no administration in time range)  pantoprazole (PROTONIX) injection 40 mg (has no administration in time range)  fentaNYL (SUBLIMAZE) injection 25 mcg (has no administration in time range)  fentaNYL (SUBLIMAZE) injection 25-100 mcg (has no administration in time range)  calcium gluconate 1 g/ 50 mL sodium chloride IVPB (has no administration in time range)  albuterol (PROVENTIL) (2.5 MG/3ML) 0.083% nebulizer solution 2.5 mg (has no administration in time range)  aspirin chewable tablet 81 mg (has no administration in time range)  budesonide (PULMICORT) nebulizer solution 0.5 mg (has no administration in time range)  arformoterol (BROVANA) nebulizer solution 15 mcg (has no administration in time range)  ipratropium (ATROVENT) nebulizer solution 0.5 mg (has no administration in time range)  insulin aspart (novoLOG) injection 0-9 Units (has no administration in time range)  etomidate (AMIDATE) injection (20 mg Intravenous Given 02/04/19 0113)  succinylcholine (ANECTINE) injection (100 mg Intravenous Given 02/04/19 0114)  dextrose 50 % solution 50 mL (50 mLs Intravenous Given 02/04/19 0419)  insulin aspart (novoLOG) injection 10 Units (10 Units Intravenous Given 02/04/19 0420)  albuterol (PROVENTIL) (2.5 MG/3ML) 0.083% nebulizer solution 2.5 mg (2.5 mg Nebulization Given 02/04/19 0511)    ED Course  I have reviewed the triage vital signs and the nursing notes.  Pertinent labs & imaging results that were available during my care of the patient were reviewed by me and considered in my medical decision making (see chart for details).  MDM Rules/Calculators/A&P Acute respiratory failure and patient on dialysis-most likely heart failure,  but possible COPD exacerbation.  Patient had to be intubated emergently.  Post intubation x-ray suggested heart failure.  Old records are reviewed, and she had a recent hospitalization for atrial fibrillation with rapid ventricular response.  Chest  x-ray shows cardiomegaly and mild pulmonary edema.  ECG shows sinus tachycardia with PACs but no acute changes.  ABG showed pH of 7.20 with PCO2 of 65 indicating predominantly respiratory acidosis.  Potassium is mildly elevated at 5.9.  ECG does show isolated peaked T waves in V4 and it is felt that hyperkalemia needed to be treated and she was given albuterol as well as intravenous glucose and insulin.  Case is discussed with Dr. Oletta Darter of critical care service agrees to admit the patient, also discussed with Dr. Justin Mend of nephrology service who will do emergent dialysis.  Final Clinical Impression(s) / ED Diagnoses Final diagnoses:  Nonspecific chest pain  Acute and chr resp failure, unsp w hypoxia or hypercapnia (HCC)  Acute on chronic systolic heart failure (HCC)  End-stage renal disease on hemodialysis ([place])  Hyperkalemia    Rx / DC Orders ED Discharge Orders    None       Delora Fuel, MD 37/85/88 [phone]

## 2019-02-04 NOTE — Progress Notes (Signed)
Hypoglycemic Event  CBG: 54   Treatment: dexterose  Symptoms: nonr  Follow-up CBG: Time:11:45 CBG Result:151   Possible Reasons for Event: none    Comments/MD notified: none    Donnetta Simpers M Kinzley Savell

## 2019-02-04 NOTE — Progress Notes (Signed)
Pharmacy Antibiotic Note  Christina [name] is a 69 y.o. female admitted on 02/04/2019 with pneumonia.  Pharmacy has been consulted for vancomycin and aztreonam dosing.  Plan: Vancomycin 1000mg  x1 then 500mg  IV every HD.  Goal pre-HD level 15-25 mcg/mL. Aztreonam 500mg  IV Q12H.   Temp (24hrs), Avg:99.3 F (37.4 C), Min:98.3 F (36.8 C), Max:100.1 F (37.8 C)  Recent Labs  Lab 02/04/19 0102  WBC 19.2*  CREATININE 6.21*    Estimated Creatinine Clearance: 6.7 mL/min (A) (by C-G formula based on SCr of 6.21 mg/dL (H)).    Allergies  Allergen Reactions  . Cyclobenzaprine Anaphylaxis and Other (See Comments)    "stopped heart"   . Morphine Anaphylaxis    "stopped heart"  . Penicillins Shortness Of Breath, Swelling and Palpitations    Did it involve swelling of the face/tongue/throat, SOB, or low BP? Yes Did it involve sudden or severe rash/hives, skin peeling, or any reaction on the inside of your mouth or nose? No Did you need to seek medical attention at a hospital or doctor's office? No When did it last happen?years  If all above answers are "NO", may proceed with cephalosporin use.   Marland Kitchen Hydromorphone Other (See Comments)    If administered quickly, felt like hand was "exploding"   . Codeine Itching and Rash     Thank you for allowing pharmacy to be a part of this patient's care.  Wynona Neat, PharmD, BCPS  02/04/2019 5:48 AM

## 2019-02-04 NOTE — ED Notes (Signed)
ED TO INPATIENT HANDOFF REPORT  ED Nurse Name and Phone #: Lovell Sheehan [phone]  S Name/Age/Gender Christina [name] 69 y.o. female Room/Bed: 021C/021C  Code Status   Code Status: Full Code  Home/SNF/Other Home  Is this baseline?   Triage Complete: Triage complete  Chief Complaint Respiratory failure [facility]) [J96.90]  Triage Note Pt arrives via [facility] fro home.  Pt called out for respiratory distress. RA spo2 86%. Pt given Albuterol, duoneb, and cpap attempted. Pt uncooperative with EMS, ripping off oxygen, not answering questions and only stating "help me" on arrival.  Hx COPD and CHF     Allergies Allergies  Allergen Reactions  . Cyclobenzaprine Anaphylaxis and Other (See Comments)    "stopped heart"   . Morphine Anaphylaxis    "stopped heart"  . Penicillins Shortness Of Breath, Swelling and Palpitations    Did it involve swelling of the face/tongue/throat, SOB, or low BP? Yes Did it involve sudden or severe rash/hives, skin peeling, or any reaction on the inside of your mouth or nose? No Did you need to seek medical attention at a hospital or doctor's office? No When did it last happen?years  If all above answers are "NO", may proceed with cephalosporin use.   Marland Kitchen Hydromorphone Other (See Comments)    If administered quickly, felt like hand was "exploding"   . Codeine Itching and Rash    Level of Care/Admitting Diagnosis ED Disposition    ED Disposition Condition [facility] Area: [place] [[id number]]  Level of Care: ICU [6]  Covid Evaluation: Confirmed COVID Negative  Diagnosis: Respiratory failure [facility]) [[id number]]  Admitting Physician: PCCM, MD [id number]  Attending Physician: Jacalyn Lefevre [[id number]]  Estimated length of stay: 3 - 4 days  Certification:: I certify this patient will need inpatient services for at least 2 midnights       B Medical/Surgery History Past Medical History:  Diagnosis Date  . Anemia   .  Anxiety   . Arthritis   . Asthma   . Atrial fibrillation ([place])   . Chronic kidney disease    Dialysis T/Th/Sa  started in March 2020  . COPD (chronic obstructive pulmonary disease) ([place])   . Coronary artery disease    2 stents  . Depression   . GERD (gastroesophageal reflux disease)   . Gout   . Headache    migraines  . History of kidney stones   . Hyperlipidemia   . Hypertension   . Pneumonia   . Small bowel obstruction [facility])    Past Surgical History:  Procedure Laterality Date  . ABDOMINAL HYSTERECTOMY    . ABDOMINAL SURGERY     for small bowel obstruction - x 2  . APPENDECTOMY    . AV FISTULA PLACEMENT Left 08/04/2018   Procedure: ARTERIOVENOUS (AV) FISTULA CREATION LEFT ARM;  Surgeon: Waynetta Sandy, MD;  Location: [place];  Service: Vascular;  Laterality: Left;  . BASCILIC VEIN TRANSPOSITION Left 11/24/2018   Procedure: SECOND STAGE BASILIC VEIN TRANSPOSITION LEFT ARM;  Surgeon: Waynetta Sandy, MD;  Location: [place];  Service: Vascular;  Laterality: Left;  . CARDIAC CATHETERIZATION    . CORONARY ANGIOPLASTY  ?2003/2004  . FACIAL RECONSTRUCTION SURGERY     x 2  . HERNIA REPAIR       A IV Location/Drains/Wounds Patient Lines/Drains/Airways Status   Active Line/Drains/Airways    Name:   Placement date:   Placement time:   Site:   Days:  Peripheral IV 01/25/19 Right Hand   01/25/19    0536    Hand   10   Peripheral IV 02/04/19 Right Wrist   02/04/19    0151    Wrist   less than 1   Peripheral IV 02/04/19 Right Forearm   02/04/19    0359    Forearm   less than 1   Fistula / Graft Left Upper arm Arteriovenous fistula   08/04/18    1124    Upper arm   184   Hemodialysis Catheter Right Internal jugular Double-lumen   --    --    Internal jugular      Hemodialysis Catheter Right Internal jugular   --    --    Internal jugular      NG/OG Tube Orogastric 16 Fr. Right mouth Aucultation;Xray Measured external length of tube   02/04/19    0121    Right mouth    less than 1   Urethral Catheter Phoebe, RN Temperature probe 14 Fr.   02/04/19    0212    Temperature probe   less than 1   Airway 7.5 mm   02/04/19    0110     less than 1   Incision (Closed) 08/04/18 Arm Left   08/04/18    1033     184   Incision (Closed) 11/24/18 Arm Left   11/24/18    0917     72          Intake/Output Last 24 hours No intake or output data in the 24 hours ending 02/04/19 0530  Labs/Imaging Results for orders placed or performed during the hospital encounter of 02/04/19 (from the past 48 hour(s))  CBC with Differential     Status: Abnormal   Collection Time: 02/04/19  1:02 AM  Result Value Ref Range   WBC 19.2 (H) 4.0 - 10.5 K/uL   RBC 3.85 (L) 3.87 - 5.11 MIL/uL   Hemoglobin 12.1 12.0 - 15.0 g/dL   HCT 37.5 36.0 - 46.0 %   MCV 97.4 80.0 - 100.0 fL   MCH 31.4 26.0 - 34.0 pg   MCHC 32.3 30.0 - 36.0 g/dL   RDW 15.3 11.5 - 15.5 %   Platelets 230 150 - 400 K/uL   nRBC 0.0 0.0 - 0.2 %   Neutrophils Relative % 91 %   Neutro Abs 17.5 (H) 1.7 - 7.7 K/uL   Lymphocytes Relative 3 %   Lymphs Abs 0.6 (L) 0.7 - 4.0 K/uL   Monocytes Relative 4 %   Monocytes Absolute 0.7 0.1 - 1.0 K/uL   Eosinophils Relative 0 %   Eosinophils Absolute 0.1 0.0 - 0.5 K/uL   Basophils Relative 0 %   Basophils Absolute 0.0 0.0 - 0.1 K/uL   Immature Granulocytes 2 %   Abs Immature Granulocytes 0.29 (H) 0.00 - 0.07 K/uL    Comment: Performed at [facility], [address], [place], [place] [address]  Comprehensive metabolic panel     Status: Abnormal   Collection Time: 02/04/19  1:02 AM  Result Value Ref Range   Sodium 138 135 - 145 mmol/L   Potassium 5.9 (H) 3.5 - 5.1 mmol/L   Chloride 103 98 - 111 mmol/L   CO2 22 22 - 32 mmol/L   Glucose, Bld 171 (H) 70 - 99 mg/dL   BUN 53 (H) 8 - 23 mg/dL   Creatinine, Ser 6.21 (H) 0.44 - 1.00 mg/dL  Calcium 7.8 (L) 8.9 - 10.3 mg/dL   Total Protein 6.4 (L) 6.5 - 8.1 g/dL   Albumin 3.2 (L) 3.5 - 5.0 g/dL   AST 29 15 - 41 U/L   ALT 25 0  - 44 U/L   Alkaline Phosphatase 62 38 - 126 U/L   Total Bilirubin 0.7 0.3 - 1.2 mg/dL   GFR calc non Af Amer 6 (L) >60 mL/min   GFR calc Af Amer 7 (L) >60 mL/min   Anion gap 13 5 - 15    Comment: Performed at [facility] [address] [place], [place] [address]  Troponin I (High Sensitivity)     Status: Abnormal   Collection Time: 02/04/19  1:02 AM  Result Value Ref Range   Troponin I (High Sensitivity) 35 (H) <18 ng/L    Comment: (NOTE) Elevated high sensitivity troponin I (hsTnI) values and significant  changes across serial measurements may suggest ACS but many other  chronic and acute conditions are known to elevate hsTnI results.  Refer to the "Links" section for chest pain algorithms and additional  guidance. Performed at [facility], [place] [address], [place], [place] [address]   Brain natriuretic peptide     Status: Abnormal   Collection Time: 02/04/19  1:03 AM  Result Value Ref Range   B Natriuretic Peptide 4,197.2 (H) 0.0 - 100.0 pg/mL    Comment: Performed at [facility] [address] [place], [place] [address]  Magnesium     Status: None   Collection Time: 02/04/19  1:22 AM  Result Value Ref Range   Magnesium 1.9 1.7 - 2.4 mg/dL    Comment: Performed at [facility] [address] [place], [place] [address]  Respiratory Panel by RT PCR (Flu A&B, Covid) - Nasopharyngeal Swab     Status: None   Collection Time: 02/04/19  1:25 AM   Specimen: Nasopharyngeal Swab  Result Value Ref Range   SARS Coronavirus 2 by RT PCR NEGATIVE NEGATIVE    Comment: (NOTE) SARS-CoV-2 target nucleic acids are NOT DETECTED. The SARS-CoV-2 RNA is generally detectable in upper respiratoy specimens during the acute phase of infection. The lowest concentration of SARS-CoV-2 viral copies this assay can detect is 131 copies/mL. A negative result does not preclude SARS-Cov-2 infection and should not be used as the sole basis for treatment or other patient  management decisions. A negative result may occur with  improper specimen collection/handling, submission of specimen other than nasopharyngeal swab, presence of viral mutation(s) within the areas targeted by this assay, and inadequate number of viral copies (<131 copies/mL). A negative result must be combined with clinical observations, patient history, and epidemiological information. The expected result is Negative. Fact Sheet for Patients:  PinkCheek.be Fact Sheet for Healthcare Providers:  GravelBags.it This test is not yet ap proved or cleared by the [place] FDA and  has been authorized for detection and/or diagnosis of SARS-CoV-2 by FDA under an Emergency Use Authorization (EUA). This EUA will remain  in effect (meaning this test can be used) for the duration of the COVID-19 declaration under Section 564(b)(1) of the Act, 21 U.S.C. section 360bbb-3(b)(1), unless the authorization is terminated or revoked sooner.    Influenza A by PCR NEGATIVE NEGATIVE   Influenza B by PCR NEGATIVE NEGATIVE    Comment: (NOTE) The Xpert Xpress SARS-CoV-2/FLU/RSV assay is intended as an aid in  the diagnosis of influenza from Nasopharyngeal swab specimens and  should not  be used as a sole basis for treatment. Nasal washings and  aspirates are unacceptable for Xpert Xpress SARS-CoV-2/FLU/RSV  testing. Fact Sheet for Patients: PinkCheek.be Fact Sheet for Healthcare Providers: GravelBags.it This test is not yet approved or cleared by the [place] FDA and  has been authorized for detection and/or diagnosis of SARS-CoV-2 by  FDA under an Emergency Use Authorization (EUA). This EUA will remain  in effect (meaning this test can be used) for the duration of the  Covid-19 declaration under Section 564(b)(1) of the Act, 21  U.S.C. section 360bbb-3(b)(1), unless the authorization  is  terminated or revoked. Performed at [facility], [place] [address], [place], [place] [address]   I-STAT 7, (LYTES, BLD GAS, ICA, H+H)     Status: Abnormal   Collection Time: 02/04/19  2:35 AM  Result Value Ref Range   pH, Arterial 7.202 (L) 7.350 - 7.450   pCO2 arterial 65.4 (HH) 32.0 - 48.0 mmHg   pO2, Arterial 481.0 (H) 83.0 - 108.0 mmHg   Bicarbonate 25.7 20.0 - 28.0 mmol/L   TCO2 28 22 - 32 mmol/L   O2 Saturation 100.0 %   Acid-base deficit 3.0 (H) 0.0 - 2.0 mmol/L   Sodium 136 135 - 145 mmol/L   Potassium 5.8 (H) 3.5 - 5.1 mmol/L   Calcium, Ion 1.10 (L) 1.15 - 1.40 mmol/L   HCT 35.0 (L) 36.0 - 46.0 %   Hemoglobin 11.9 (L) 12.0 - 15.0 g/dL   Patient temperature 98.3 F    Collection site RADIAL, ALLEN'S TEST ACCEPTABLE    Sample type ARTERIAL    Comment NOTIFIED PHYSICIAN   Troponin I (High Sensitivity)     Status: Abnormal   Collection Time: 02/04/19  3:02 AM  Result Value Ref Range   Troponin I (High Sensitivity) 37 (H) <18 ng/L    Comment: (NOTE) Elevated high sensitivity troponin I (hsTnI) values and significant  changes across serial measurements may suggest ACS but many other  chronic and acute conditions are known to elevate hsTnI results.  Refer to the "Links" section for chest pain algorithms and additional  guidance. Performed at [facility], [place] [address], [place], [place] [address]   I-STAT 7, (LYTES, BLD GAS, ICA, H+H)     Status: Abnormal   Collection Time: 02/04/19  5:27 AM  Result Value Ref Range   pH, Arterial 7.353 7.350 - 7.450   pCO2 arterial 42.0 32.0 - 48.0 mmHg   pO2, Arterial 80.0 (L) 83.0 - 108.0 mmHg   Bicarbonate 23.3 20.0 - 28.0 mmol/L   TCO2 25 22 - 32 mmol/L   O2 Saturation 95.0 %   Acid-base deficit 2.0 0.0 - 2.0 mmol/L   Sodium 136 135 - 145 mmol/L   Potassium 4.7 3.5 - 5.1 mmol/L   Calcium, Ion 1.07 (L) 1.15 - 1.40 mmol/L   HCT 32.0 (L) 36.0 - 46.0 %   Hemoglobin 10.9 (L) 12.0 - 15.0 g/dL   Patient temperature  98.6 F    Collection site RADIAL, ALLEN'S TEST ACCEPTABLE    Sample type ARTERIAL    DG Chest Portable 1 View  Result Date: 02/04/2019 CLINICAL DATA:  69 year old female status post intubation. EXAM: PORTABLE CHEST 1 VIEW COMPARISON:  Chest radiograph dated 01/24/2019. FINDINGS: Right-sided dialysis catheter in stable position. Endotracheal tube with tip approximately 4 cm above the carina and enteric tube extending inferior to the diaphragm with tip beyond the inferior margin of the image. There is mild cardiomegaly with  probable vascular congestion and mild edema. Developing pneumonia is not excluded. Clinical correlation is recommended. A small left pleural effusion may be present. No pneumothorax. Atherosclerotic calcification of the aorta. No acute osseous pathology. IMPRESSION: 1. Endotracheal tube above the carina and enteric tube with tip beyond the inferior margin of the image. 2. Cardiomegaly with mild edema. Developing pneumonia is not excluded. Electronically Signed   By: Anner Crete M.D.   On: 02/04/2019 01:32    Pending Labs Unresulted Labs (From admission, onward)    Start     Ordered   02/05/19 0500  CBC  Tomorrow morning,   R     02/04/19 0502   02/05/19 1610  Basic metabolic panel  Tomorrow morning,   R     02/04/19 0502   02/05/19 0500  Blood gas, arterial  Tomorrow morning,   R     02/04/19 0502   02/05/19 0500  Magnesium  Tomorrow morning,   R     02/04/19 0502   02/05/19 0500  Phosphorus  Tomorrow morning,   R     02/04/19 0502   02/05/19 0500  Triglycerides  (propofol (DIPRIVAN))  Daily,   R    Comments: While on propofol (DIPRIVAN)    02/04/19 0503   02/04/19 0600  Blood gas, arterial  Once,   STAT     02/04/19 0506   02/04/19 0506  Hemoglobin A1c  Once,   STAT    Comments: To assess prior glycemic control    02/04/19 0506   02/04/19 0502  Culture, blood (routine x 2)  BLOOD CULTURE X 2,   R (with STAT occurrences)     02/04/19 0502   02/04/19 0502  Urine  culture  Once,   STAT     02/04/19 0502   02/04/19 0502  Culture, respiratory (tracheal aspirate)  Once,   STAT     02/04/19 0502          Vitals/Pain Today's Vitals   02/04/19 0430 02/04/19 0445 02/04/19 0500 02/04/19 0512  BP: (!) 152/71 (!) 166/78 (!) 167/67   Pulse: 80 82 78 74  Resp: (!) 22 (!) 22 (!) 22 (!) 22  Temp: 98.9 F (37.2 C) 98.8 F (37.1 C) 98.6 F (37 C)   TempSrc:      SpO2: 100% 100% 100% 100%    Isolation Precautions Airborne and Contact precautions  Medications Medications  propofol (DIPRIVAN) 1000 MG/100ML infusion (60 mcg/kg/min  49 kg Intravenous Rate/Dose Change 02/04/19 0402)  heparin injection 5,000 Units (has no administration in time range)  pantoprazole (PROTONIX) injection 40 mg (has no administration in time range)  fentaNYL (SUBLIMAZE) injection 25 mcg (has no administration in time range)  fentaNYL (SUBLIMAZE) injection 25-100 mcg (has no administration in time range)  calcium gluconate 1 g/ 50 mL sodium chloride IVPB (has no administration in time range)  albuterol (PROVENTIL) (2.5 MG/3ML) 0.083% nebulizer solution 2.5 mg (has no administration in time range)  aspirin chewable tablet 81 mg (has no administration in time range)  budesonide (PULMICORT) nebulizer solution 0.5 mg (has no administration in time range)  arformoterol (BROVANA) nebulizer solution 15 mcg (has no administration in time range)  ipratropium (ATROVENT) nebulizer solution 0.5 mg (has no administration in time range)  insulin aspart (novoLOG) injection 0-9 Units (has no administration in time range)  etomidate (AMIDATE) injection (20 mg Intravenous Given 02/04/19 0113)  succinylcholine (ANECTINE) injection (100 mg Intravenous Given 02/04/19 0114)  dextrose 50 % solution 50 mL (  50 mLs Intravenous Given 02/04/19 0419)  insulin aspart (novoLOG) injection 10 Units (10 Units Intravenous Given 02/04/19 0420)  albuterol (PROVENTIL) (2.5 MG/3ML) 0.083% nebulizer solution 2.5 mg (2.5 mg  Nebulization Given 02/04/19 0511)    Mobility non-ambulatory     Focused Assessments Pulmonary Assessment Handoff:  Lung sounds: Bilateral Breath Sounds: Diminished L Breath Sounds: Rhonchi R Breath Sounds: Rhonchi O2 Device: Ventilator        R Recommendations: See Admitting Provider Note  Report given to:   Additional Notes:

## 2019-02-04 NOTE — Progress Notes (Signed)
PT transported to 2h with no complications.

## 2019-02-05 ENCOUNTER — Inpatient Hospital Stay (HOSPITAL_COMMUNITY): Payer: Medicare Other

## 2019-02-05 LAB — BASIC METABOLIC PANEL
Anion gap: 16 — ABNORMAL HIGH (ref 5–15)
BUN: 70 mg/dL — ABNORMAL HIGH (ref 8–23)
CO2: 20 mmol/L — ABNORMAL LOW (ref 22–32)
Calcium: 8.6 mg/dL — ABNORMAL LOW (ref 8.9–10.3)
Chloride: 104 mmol/L (ref 98–111)
Creatinine, Ser: 6.96 mg/dL — ABNORMAL HIGH (ref 0.44–1.00)
GFR calc Af Amer: 6 mL/min — ABNORMAL LOW (ref >60)
GFR calc non Af Amer: 6 mL/min — ABNORMAL LOW (ref >60)
Glucose, Bld: 124 mg/dL — ABNORMAL HIGH (ref 70–99)
Potassium: 4.3 mmol/L (ref 3.5–5.1)
Sodium: 140 mmol/L (ref 135–145)

## 2019-02-05 LAB — CBC
HCT: 34 % — ABNORMAL LOW (ref 36.0–46.0)
Hemoglobin: 11 g/dL — ABNORMAL LOW (ref 12.0–15.0)
MCH: 29.9 pg (ref 26.0–34.0)
MCHC: 32.4 g/dL (ref 30.0–36.0)
MCV: 92.4 fL (ref 80.0–100.0)
Platelets: 218 10*3/uL (ref 150–400)
RBC: 3.68 MIL/uL — ABNORMAL LOW (ref 3.87–5.11)
RDW: 15.5 % (ref 11.5–15.5)
WBC: 6.3 10*3/uL (ref 4.0–10.5)
nRBC: 0 % (ref 0.0–0.2)

## 2019-02-05 LAB — POCT I-STAT 7, (LYTES, BLD GAS, ICA,H+H)
Acid-Base Excess: 1 mmol/L (ref 0.0–2.0)
Bicarbonate: 26 mmol/L (ref 20.0–28.0)
Calcium, Ion: 1.16 mmol/L (ref 1.15–1.40)
HCT: 32 % — ABNORMAL LOW (ref 36.0–46.0)
Hemoglobin: 10.9 g/dL — ABNORMAL LOW (ref 12.0–15.0)
O2 Saturation: 99 %
Patient temperature: 36.8
Potassium: 4.4 mmol/L (ref 3.5–5.1)
Sodium: 141 mmol/L (ref 135–145)
TCO2: 27 mmol/L (ref 22–32)
pCO2 arterial: 39.7 mmHg (ref 32.0–48.0)
pH, Arterial: 7.423 (ref 7.350–7.450)
pO2, Arterial: 125 mmHg — ABNORMAL HIGH (ref 83.0–108.0)

## 2019-02-05 LAB — GLUCOSE, CAPILLARY
Glucose-Capillary: 114 mg/dL — ABNORMAL HIGH (ref 70–99)
Glucose-Capillary: 120 mg/dL — ABNORMAL HIGH (ref 70–99)
Glucose-Capillary: 126 mg/dL — ABNORMAL HIGH (ref 70–99)
Glucose-Capillary: 131 mg/dL — ABNORMAL HIGH (ref 70–99)
Glucose-Capillary: 141 mg/dL — ABNORMAL HIGH (ref 70–99)
Glucose-Capillary: 142 mg/dL — ABNORMAL HIGH (ref 70–99)
Glucose-Capillary: 62 mg/dL — ABNORMAL LOW (ref 70–99)

## 2019-02-05 LAB — HEPATITIS B SURFACE ANTIGEN: Hepatitis B Surface Ag: NONREACTIVE

## 2019-02-05 LAB — PHOSPHORUS
Phosphorus: 6.8 mg/dL — ABNORMAL HIGH (ref 2.5–4.6)
Phosphorus: 4.8 mg/dL — ABNORMAL HIGH (ref 2.5–4.6)

## 2019-02-05 LAB — URINE CULTURE: Culture: NO GROWTH

## 2019-02-05 LAB — MAGNESIUM
Magnesium: 2.2 mg/dL (ref 1.7–2.4)
Magnesium: 1.9 mg/dL (ref 1.7–2.4)

## 2019-02-05 LAB — TRIGLYCERIDES: Triglycerides: 216 mg/dL — ABNORMAL HIGH (ref <150)

## 2019-02-05 MED ORDER — ISOSORBIDE MONONITRATE ER 30 MG PO TB24
30.0000 mg | ORAL_TABLET | Freq: Every day | ORAL | Status: DC
  Administered 2019-02-05 – 2019-02-07 (×3): 30 mg via ORAL
  Filled 2019-02-05 (×3): qty 1

## 2019-02-05 MED ORDER — HEPARIN SODIUM (PORCINE) 1000 UNIT/ML IJ SOLN
1000.0000 [IU] | INTRAMUSCULAR | Status: DC | PRN
  Filled 2019-02-05: qty 1

## 2019-02-05 MED ORDER — HEPARIN SODIUM (PORCINE) 1000 UNIT/ML IJ SOLN
INTRAMUSCULAR | Status: AC
  Administered 2019-02-05: 11:00:00 3200 [IU] via INTRAVENOUS
  Filled 2019-02-05: qty 4

## 2019-02-05 MED ORDER — METOPROLOL TARTRATE 5 MG/5ML IV SOLN
5.0000 mg | INTRAVENOUS | Status: DC | PRN
  Administered 2019-02-05: 13:00:00 5 mg via INTRAVENOUS
  Filled 2019-02-05: qty 5

## 2019-02-05 MED ORDER — VANCOMYCIN HCL IN DEXTROSE 500-5 MG/100ML-% IV SOLN
INTRAVENOUS | Status: AC
  Administered 2019-02-05: 11:00:00 500 mg via INTRAVENOUS
  Filled 2019-02-05: qty 100

## 2019-02-05 MED ORDER — DILTIAZEM HCL-DEXTROSE 125-5 MG/125ML-% IV SOLN (PREMIX)
5.0000 mg/h | INTRAVENOUS | Status: DC
  Administered 2019-02-05 – 2019-02-06 (×2): 5 mg/h via INTRAVENOUS
  Filled 2019-02-05 (×3): qty 125

## 2019-02-05 MED ORDER — DEXTROSE 50 % IV SOLN
INTRAVENOUS | Status: AC
  Administered 2019-02-05: 16:00:00 25 mL
  Filled 2019-02-05: qty 50

## 2019-02-05 NOTE — Progress Notes (Signed)
Hypoglycemic Event  CBG: 62  Treatment: 25cc Dextrose 50% Symptoms: None Follow-up CBG Time: 1642  CBG Result: 140  Possible Reasons for Event: NPO Comments/MD notified: N/A, problem resolved    Gaynell Face, [employer]

## 2019-02-05 NOTE — Progress Notes (Signed)
Patient extubated per MD's order placed on 3LNC SATS 98%, RR 20, HR 101 BPM, BP 173/116, patient is able to vocalize.

## 2019-02-05 NOTE — Progress Notes (Signed)
$  320 cash delivered to ED security office to keep in safe box #7, per pt's request.

## 2019-02-05 NOTE — Progress Notes (Signed)
eLink Physician-Brief Progress Note Patient Name: Christina [name] DOB: 1950/05/31 MRN: [id number]   Date of Service  02/05/2019  HPI/Events of Note  AFIB with RVR - Ventricular rate = 136. BP = 140/69. BMP and Mg++ in lab in process.   eICU Interventions  Will order: 1. Cardizem IV infusion. Titrate to HR = 65-105. 2. Await K+ and Mg++ results.         Dyllen Menning [place] 02/05/2019, 6:14 AM

## 2019-02-05 NOTE — Progress Notes (Signed)
[facility] Progress Note  Name: Christina [name] MRN: [id number] DOB: 08/16/1950  Chief Complaint:  Shortness of breath  Subjective:  Her treatment was pushed to today due to inpatient volumes. She was just started on dilt for afib. Spoke with nursing.  Remains intubated in ICU  Review of systems:  Unable to obtain secondary to intubated  ------------ Background on consult:  End-stage renal disease on hemodialysis Tuesday Thursday Saturday who presented to the ER with respiratory distress.  She was intolerant of BiPAP and ultimately was intubated.  She is also been started empirically on Vanco and aztreonam.    Intake/Output Summary (Last 24 hours) at 02/05/2019 0656 Last data filed at 02/05/2019 0600 Gross per 24 hour  Intake 996.01 ml  Output 2465 ml  Net -1468.99 ml    Vitals:  Vitals:   02/05/19 0352 02/05/19 0400 02/05/19 0500 02/05/19 0600  BP:  (!) 180/71 (!) 143/70 (!) 156/113  Pulse:  80 (!) 54 64  Resp:  (!) 22 (!) 22 13  Temp:  98.2 F (36.8 C) 97.7 F (36.5 C) 97.9 F (36.6 C)  TempSrc:  Bladder    SpO2: 100% 100% 100% 100%  Weight:   48.1 kg      Physical Exam:  General adult female in bed intubated HEENT normocephalic atraumatic Lungs coarse mechanical breath sounds Heart S1S2 no rub irregular; tachy Abdomen soft nondistended Extremities no pitting edema  Neuro - sedation currently running Access: LUE AVF bruit and thrill; RIJ tunneled catheter  Medications reviewed   Labs:  BMP Latest Ref Rng & Units 02/05/2019 02/05/2019 02/04/2019  Glucose 70 - 99 mg/dL - 124(H) -  BUN 8 - 23 mg/dL - 70(H) -  Creatinine 0.44 - 1.00 mg/dL - 6.96(H) -  Sodium 135 - 145 mmol/L 141 140 136  Potassium 3.5 - 5.1 mmol/L 4.4 4.3 4.7  Chloride 98 - 111 mmol/L - 104 -  CO2 22 - 32 mmol/L - 20(L) -  Calcium 8.9 - 10.3 mg/dL - 8.6(L) -     Assessment/Plan:   # End-stage renal disease on hemodialysis  - HD today per her TTS schedule.  # Acute hypoxic  respiratory failure  - on mechanical ventilation per pulmonary - Optimize volume status with HD - note HD COPD  # Hyperkalemia.  Temporized.  Plan for HD today  # Hypertension UF with HD and s/p initiation of dilt with 817'R systolic on my exam  # Anemia of CKD  - At goal on ESA regimen  # Secondary hyperparathyroidism vitamin D and will need binders to be adjusted when taking p.o.  Claudia Desanctis, MD 02/05/2019 6:57 AM

## 2019-02-05 NOTE — Progress Notes (Signed)
NAME:  Christina [name], MRN:  [id number], DOB:  1950/10/19, LOS: 1 ADMISSION DATE:  02/04/2019, CONSULTATION DATE:  02/05/2019 REFERRING MD:  Delora Fuel MD CHIEF COMPLAINT:  Acute respiratory distress    Brief History   69 yo F with a history of COPD, CHF (EF 45-50%), A.fib not on anticoagulation, ESRD on HD TTS who presented with acute respiratory distress, intubated quickly on arrival to the ED.   History of present illness   History is very limited due to patient's intubated status.  It appears that EMS was called to her house of shortness of breath.  On arrival to the ED, patient was agitated and uncooperative with trying to start BIPAP and decision was made to intubate.   Patient was recently discharged from [facility] on 1/2 after she was admitted for Afib with RVR.  She has a history of paroxysmal Afib and sees cardiology at [facility], is not on anticoagulation.   Past Medical History   Past Medical History:  Diagnosis Date  . Anemia   . Anxiety   . Arthritis   . Asthma   . Atrial fibrillation ([place])   . Chronic kidney disease    Dialysis T/Th/Sa  started in March 2020  . COPD (chronic obstructive pulmonary disease) ([place])   . Coronary artery disease    2 stents  . Depression   . GERD (gastroesophageal reflux disease)   . Gout   . Headache    migraines  . History of kidney stones   . Hyperlipidemia   . Hypertension   . Pneumonia   . Small bowel obstruction ([place])    Significant Hospital Events   Intubated in ED 1/8  Consults:  Nephrology  Procedures:  ETT 1/8 >> 1/9  Significant Diagnostic Tests:  1/8 CXR: reviewed.  IMPRESSION: 1. Endotracheal tube above the carina and enteric tube with tip beyond the inferior margin of the image. 2. Cardiomegaly with mild edema. Developing pneumonia is not Excluded.  BNP >4,000 (prior 1,500)  Micro Data:  BCx 1/8 > no growth <24hrs   Antimicrobials:  None   Interim history/subjective:  Overnight, patient with two hypoglycemic  events requiring D50. Also noted A.fib w/RVR for which started on cardizem gtt. Patient able to tolerate vent wean this morning and subsequently extubated.   Objective   Blood pressure (!) 156/113, pulse (!) 120, temperature 98.4 F (36.9 C), resp. rate (!) 26, weight 48.1 kg, SpO2 100 %.    Vent Mode: PRVC FiO2 (%):  [40 %] 40 % Set Rate:  [22 bmp] 22 bmp Vt Set:  [400 mL] 400 mL PEEP:  [5 cmH20] 5 cmH20 Plateau Pressure:  [14 cmH20-16 cmH20] 15 cmH20   Intake/Output Summary (Last 24 hours) at 02/05/2019 0750 Last data filed at 02/05/2019 0700 Gross per 24 hour  Intake 1017.66 ml  Output 2465 ml  Net -1447.34 ml   Filed Weights   02/05/19 0500  Weight: 48.1 kg    Examination: Physical Exam  Constitutional: She is oriented to person, place, and time and well-developed, well-nourished, and in no distress.  HENT:  Head: Normocephalic and atraumatic.  Cardiovascular: Normal heart sounds and intact distal pulses. An irregular rhythm present. Tachycardia present. Exam reveals no gallop and no friction rub.  No murmur heard. Pulmonary/Chest: Effort normal. She has no wheezes. She has rales.  Abdominal: Soft. Bowel sounds are normal.  Musculoskeletal:        General: No edema. Normal range of motion.  Neurological: She is alert  and oriented to person, place, and time.  Skin: Skin is warm and dry.     Resolved Hospital Problem list   Acute respiratory failure   Assessment & Plan:  69 yo female with Hx of COPD, CHF and ESRD on HD TTS presented with acute respiratory distress and inability to tolerate BiPAP.   Acute respiratory insufficiency with CHF vs COPD exacerbation s/p extubation:  - CXR with hypervolemia with ?multifocal PNA vs aspiration pneumonitis; elevated BNP from baseline of 1200.  - BCx negative <24 hours - Patient extubated and on 3L O2 maintaining good saturation  P:  Bronvana, pulmicort, atrovent and albuterol nebs  Aztreonam and vancomycin to cover for  HCAP HD for fluid removal   ESRD on HD TTS: - Volume up and hyperkalemic - Nephrology on board and patient received dialysis today with ~1L taken off prior to patient becoming hypotensive  P:  HD per nephrology Electrolyte replacement as needed  A.fib with RVR:  P:  Cardizem gtt  Diabetes mellitus: P:  Continue CBG monitoring SSI   Best practice:  Diet: clear liquid Pain/Anxiety/Delirium protocol (if indicated): per protocol VAP protocol (if indicated): n/a DVT prophylaxis: heparin + SCDs GI prophylaxis: protonix Glucose control: SSI Mobility: OOB as tolerated Code Status: FULL Family Communication: will update family Disposition: ICU  Labs   CBC: Recent Labs  Lab 02/04/19 0102 02/04/19 0235 02/04/19 0527 02/05/19 0131 02/05/19 0404  WBC 19.2*  --   --  6.3  --   NEUTROABS 17.5*  --   --   --   --   HGB 12.1 11.9* 10.9* 11.0* 10.9*  HCT 37.5 35.0* 32.0* 34.0* 32.0*  MCV 97.4  --   --  92.4  --   PLT 230  --   --  218  --     Basic Metabolic Panel: Recent Labs  Lab 02/04/19 0102 02/04/19 0122 02/04/19 0235 02/04/19 0527 02/04/19 1214 02/04/19 1700 02/05/19 0131 02/05/19 0404  NA 138  --  136 136  --   --  140 141  K 5.9*  --  5.8* 4.7  --   --  4.3 4.4  CL 103  --   --   --   --   --  104  --   CO2 22  --   --   --   --   --  20*  --   GLUCOSE 171*  --   --   --   --   --  124*  --   BUN 53*  --   --   --   --   --  70*  --   CREATININE 6.21*  --   --   --   --   --  6.96*  --   CALCIUM 7.8*  --   --   --   --   --  8.6*  --   MG  --  1.9  --   --  2.0 2.0 2.2  --   PHOS  --   --   --   --  4.4 5.6* 6.8*  --    GFR: Estimated Creatinine Clearance: 5.9 mL/min (A) (by C-G formula based on SCr of 6.96 mg/dL (H)). Recent Labs  Lab 02/04/19 0102 02/05/19 0131  WBC 19.2* 6.3    Liver Function Tests: Recent Labs  Lab 02/04/19 0102  AST 29  ALT 25  ALKPHOS 62  BILITOT 0.7  PROT 6.4*  ALBUMIN 3.2*  No results for input(s): LIPASE,  AMYLASE in the last 168 hours. No results for input(s): AMMONIA in the last 168 hours.  ABG    Component Value Date/Time   PHART 7.423 02/05/2019 0404   PCO2ART 39.7 02/05/2019 0404   PO2ART 125.0 (H) 02/05/2019 0404   HCO3 26.0 02/05/2019 0404   TCO2 27 02/05/2019 0404   ACIDBASEDEF 2.0 02/04/2019 0527   O2SAT 99.0 02/05/2019 0404     Coagulation Profile: No results for input(s): INR, PROTIME in the last 168 hours.  Cardiac Enzymes: No results for input(s): CKTOTAL, CKMB, CKMBINDEX, TROPONINI in the last 168 hours.  HbA1C: Hgb A1c MFr Bld  Date/Time Value Ref Range Status  02/04/2019 05:06 AM 5.5 4.8 - 5.6 % Final    Comment:    (NOTE) Pre diabetes:          5.7%-6.4% Diabetes:              >6.4% Glycemic control for   <7.0% adults with diabetes   04/01/2008 02:30 AM  4.6 - 6.1 % Final   6.0 (NOTE)   The ADA recommends the following therapeutic goal for glycemic   control related to Hgb A1C measurement:   Goal of Therapy:   < 7.0% Hgb A1C   Reference: American Diabetes Association: Clinical Practice   Recommendations 2008, Diabetes Care,  2008, 31:(Suppl 1).    CBG: Recent Labs  Lab 02/04/19 1554 02/04/19 2013 02/04/19 2305 02/05/19 0023 02/05/19 0328  GLUCAP 100* 121* 133* 120* 126*    Review of Systems:   Reviewed and negative.   Past Medical History  She,  has a past medical history of Anemia, Anxiety, Arthritis, Asthma, Atrial fibrillation ([place]), Chronic kidney disease, COPD (chronic obstructive pulmonary disease) ([place]), Coronary artery disease, Depression, GERD (gastroesophageal reflux disease), Gout, Headache, History of kidney stones, Hyperlipidemia, Hypertension, Pneumonia, and Small bowel obstruction ([place]).   Surgical History    Past Surgical History:  Procedure Laterality Date  . ABDOMINAL HYSTERECTOMY    . ABDOMINAL SURGERY     for small bowel obstruction - x 2  . APPENDECTOMY    . AV FISTULA PLACEMENT Left 08/04/2018   Procedure:  ARTERIOVENOUS (AV) FISTULA CREATION LEFT ARM;  Surgeon: Waynetta Sandy, MD;  Location: [facility];  Service: Vascular;  Laterality: Left;  . BASCILIC VEIN TRANSPOSITION Left 11/24/2018   Procedure: SECOND STAGE BASILIC VEIN TRANSPOSITION LEFT ARM;  Surgeon: Waynetta Sandy, MD;  Location: [facility];  Service: Vascular;  Laterality: Left;  . CARDIAC CATHETERIZATION    . CORONARY ANGIOPLASTY  ?2003/2004  . FACIAL RECONSTRUCTION SURGERY     x 2  . HERNIA REPAIR       Social History   reports that she has quit smoking. She has never used smokeless tobacco. She reports previous alcohol use. She reports previous drug use. Drug: Marijuana.   Family History   Her family history is not on file.   Allergies Allergies  Allergen Reactions  . Cyclobenzaprine Anaphylaxis and Other (See Comments)    "stopped heart"   . Morphine Anaphylaxis    "stopped heart"  . Penicillins Shortness Of Breath, Swelling and Palpitations    Did it involve swelling of the face/tongue/throat, SOB, or low BP? Yes Did it involve sudden or severe rash/hives, skin peeling, or any reaction on the inside of your mouth or nose? No Did you need to seek medical attention at a hospital or doctor's office? No When did it last happen?years  If all  above answers are "NO", may proceed with cephalosporin use.   Marland Kitchen Hydromorphone Other (See Comments)    If administered quickly, felt like hand was "exploding"   . Codeine Itching and Rash     Home Medications  Prior to Admission medications   Medication Sig Start Date End Date Taking? Authorizing Provider  amLODipine (NORVASC) 10 MG tablet Take 10 mg by mouth daily.   Yes [provider]  albuterol (PROVENTIL) (2.5 MG/3ML) 0.083% nebulizer solution Take 2.5 mg by nebulization every 6 (six) hours as needed for wheezing or shortness of breath.    [provider]  allopurinol (ZYLOPRIM) 100 MG tablet Take 100 mg by mouth daily. 02/18/18   [provider]  aspirin EC 81 MG tablet Take 81 mg by mouth daily.    [provider]  AURYXIA 1 GM 210 MG(Fe) tablet Take 420 mg by mouth See admin instructions. Take 3 tablets (630 mg) by mouth with each meal & take 2 tablets (420 mg) by mouth with each snack 08/16/18   [provider]  Carboxymethylcellul-Glycerin (LUBRICATING EYE DROPS OP) Place 1 drop into both eyes daily as needed (dry eyes).    [provider]  Cholecalciferol (VITAMIN D3) 50 MCG (2000 UT) capsule Take 2,000 Units by mouth daily.     [provider]  dicyclomine (BENTYL) 10 MG capsule Take 20 mg by mouth at bedtime.  08/27/17   [provider]  docusate sodium (COLACE) 100 MG capsule Take 1 capsule (100 mg total) by mouth 2 (two) times daily. 08/11/18   Hongalgi, Lenis Dickinson, MD  doxylamine, Sleep, (UNISOM) 25 MG tablet Take 25 mg by mouth at bedtime as needed for sleep.    [provider]  furosemide (LASIX) 80 MG tablet Take 80 mg by mouth See admin instructions. Take one tablet on TUE THUR SAT before dialysis. 10/21/18   [provider]  gabapentin (NEURONTIN) 300 MG capsule Take 600 mg by mouth at bedtime as needed (leg pain.).  08/17/18   [provider]  Ipratropium-Albuterol (COMBIVENT) 20-100 MCG/ACT AERS respimat Inhale 2 puffs into the lungs 4 (four) times daily as needed for wheezing. 09/11/17   [provider]  isosorbide mononitrate (IMDUR) 30 MG 24 hr tablet Take 30 mg by mouth daily. 08/17/18   [provider]  labetalol (NORMODYNE) 200 MG tablet Take 200 mg by mouth 2 (two) times daily.    [provider]  lidocaine-prilocaine (EMLA) cream Apply 1 application topically daily as needed (1-2 hours before dialysis (cover with occlusive dressing)).  09/03/18   [provider]  metoprolol tartrate (LOPRESSOR) 100 MG tablet Take 1 tablet (100 mg total) by mouth 2 (two) times daily. 01/26/19   Little Ishikawa, MD   nitroGLYCERIN (NITROSTAT) 0.4 MG SL tablet Place 0.4 mg under the tongue every 5 (five) minutes as needed for chest pain.  09/18/16   [provider]  ondansetron (ZOFRAN) 4 MG tablet Take 4 mg by mouth 3 (three) times daily as needed for nausea. 09/14/18   [provider]  pantoprazole (PROTONIX) 40 MG tablet Take 1 tablet (40 mg total) by mouth daily. Patient taking differently: Take 40 mg by mouth daily as needed (acid reflux/indigestion.).  08/11/18   Hongalgi, Lenis Dickinson, MD  polyethylene glycol (MIRALAX / GLYCOLAX) 17 g packet Take 17 g by mouth 2 (two) times daily. Patient taking differently: Take 17 g by mouth every other day.  08/11/18   Hongalgi, Lenis Dickinson,  MD  pravastatin (PRAVACHOL) 80 MG tablet Take 80 mg by mouth every evening.  06/30/16   [provider]  sennosides-docusate sodium (SENOKOT-S) 8.6-50 MG tablet Take 1 tablet by mouth 2 (two) times daily.     [provider]  SYMBICORT 160-4.5 MCG/ACT inhaler Inhale 2 puffs into the lungs every 4 (four) hours as needed for shortness of breath or wheezing. 09/16/18   [provider]     Critical care time:    CRITICAL CARE Performed by: Harvie Heck  Total critical care time: 35 minutes  Critical care was necessary to treat or prevent imminent or life-threatening deterioration. Critical care was time spent personally by me on the following activities: development of treatment plan with patient and/or surrogate as well as nursing, discussions with consultants, evaluation of patient's response to treatment, examination of patient, obtaining history from patient or surrogate, ordering and performing treatments and interventions, ordering and review of laboratory studies, ordering and review of radiographic studies, pulse oximetry and re-evaluation of patient's condition.   Harvie Heck, MD Internal Medicine, PGY-1 Pager # [phone] 02/05/2019 8:00AM

## 2019-02-05 NOTE — Progress Notes (Signed)
eLink Physician-Brief Progress Note Patient Name: Christina [name] DOB: 03/30/1950 MRN: [id number]   Date of Service  02/05/2019  HPI/Events of Note  Hypertension with SBPs in 170s.  eICU Interventions  Restart home imdur 30 mg qday, including a dose now.     Intervention Category Intermediate Interventions: Hypertension - evaluation and management  Charlott Rakes 02/05/2019, 9:26 PM

## 2019-02-06 LAB — GLUCOSE, CAPILLARY
Glucose-Capillary: 113 mg/dL — ABNORMAL HIGH (ref 70–99)
Glucose-Capillary: 130 mg/dL — ABNORMAL HIGH (ref 70–99)
Glucose-Capillary: 59 mg/dL — ABNORMAL LOW (ref 70–99)
Glucose-Capillary: 74 mg/dL (ref 70–99)
Glucose-Capillary: 79 mg/dL (ref 70–99)
Glucose-Capillary: 86 mg/dL (ref 70–99)

## 2019-02-06 LAB — CBC
HCT: 32.4 % — ABNORMAL LOW (ref 36.0–46.0)
Hemoglobin: 10.7 g/dL — ABNORMAL LOW (ref 12.0–15.0)
MCH: 30.7 pg (ref 26.0–34.0)
MCHC: 33 g/dL (ref 30.0–36.0)
MCV: 93.1 fL (ref 80.0–100.0)
Platelets: 192 10*3/uL (ref 150–400)
RBC: 3.48 MIL/uL — ABNORMAL LOW (ref 3.87–5.11)
RDW: 15.8 % — ABNORMAL HIGH (ref 11.5–15.5)
WBC: 8.8 10*3/uL (ref 4.0–10.5)
nRBC: 0 % (ref 0.0–0.2)

## 2019-02-06 LAB — COMPREHENSIVE METABOLIC PANEL
ALT: 16 U/L (ref 0–44)
AST: 17 U/L (ref 15–41)
Albumin: 2.7 g/dL — ABNORMAL LOW (ref 3.5–5.0)
Alkaline Phosphatase: 55 U/L (ref 38–126)
Anion gap: 13 (ref 5–15)
BUN: 41 mg/dL — ABNORMAL HIGH (ref 8–23)
CO2: 23 mmol/L (ref 22–32)
Calcium: 8.2 mg/dL — ABNORMAL LOW (ref 8.9–10.3)
Chloride: 98 mmol/L (ref 98–111)
Creatinine, Ser: 4.5 mg/dL — ABNORMAL HIGH (ref 0.44–1.00)
GFR calc Af Amer: 11 mL/min — ABNORMAL LOW (ref >60)
GFR calc non Af Amer: 9 mL/min — ABNORMAL LOW (ref >60)
Glucose, Bld: 93 mg/dL (ref 70–99)
Potassium: 4.9 mmol/L (ref 3.5–5.1)
Sodium: 134 mmol/L — ABNORMAL LOW (ref 135–145)
Total Bilirubin: 0.7 mg/dL (ref 0.3–1.2)
Total Protein: 5.7 g/dL — ABNORMAL LOW (ref 6.5–8.1)

## 2019-02-06 LAB — TRIGLYCERIDES: Triglycerides: 134 mg/dL (ref <150)

## 2019-02-06 MED ORDER — FUROSEMIDE 10 MG/ML IJ SOLN
80.0000 mg | Freq: Once | INTRAMUSCULAR | Status: AC
  Administered 2019-02-06: 17:00:00 80 mg via INTRAVENOUS
  Filled 2019-02-06: qty 8

## 2019-02-06 MED ORDER — ORAL CARE MOUTH RINSE
15.0000 mL | Freq: Two times a day (BID) | OROMUCOSAL | Status: DC
  Administered 2019-02-06 (×3): 15 mL via OROMUCOSAL

## 2019-02-06 MED ORDER — SENNA 8.6 MG PO TABS
2.0000 | ORAL_TABLET | Freq: Every day | ORAL | Status: DC
  Administered 2019-02-06: 17.2 mg via ORAL
  Filled 2019-02-06: qty 2

## 2019-02-06 MED ORDER — IPRATROPIUM BROMIDE 0.02 % IN SOLN
0.5000 mg | Freq: Three times a day (TID) | RESPIRATORY_TRACT | Status: DC
  Administered 2019-02-07: 09:00:00 0.5 mg via RESPIRATORY_TRACT
  Filled 2019-02-06 (×2): qty 2.5

## 2019-02-06 MED ORDER — FUROSEMIDE 10 MG/ML IJ SOLN
80.0000 mg | Freq: Once | INTRAMUSCULAR | Status: AC
  Administered 2019-02-06: 09:00:00 80 mg via INTRAVENOUS
  Filled 2019-02-06: qty 8

## 2019-02-06 MED ORDER — MAGNESIUM SULFATE 2 GM/50ML IV SOLN
2.0000 g | Freq: Once | INTRAVENOUS | Status: AC
  Administered 2019-02-06: 09:00:00 2 g via INTRAVENOUS
  Filled 2019-02-06: qty 50

## 2019-02-06 MED ORDER — PANTOPRAZOLE SODIUM 40 MG PO TBEC
40.0000 mg | DELAYED_RELEASE_TABLET | Freq: Every day | ORAL | Status: DC
  Administered 2019-02-06: 40 mg via ORAL
  Filled 2019-02-06: qty 1

## 2019-02-06 MED ORDER — METOPROLOL TARTRATE 50 MG PO TABS
50.0000 mg | ORAL_TABLET | Freq: Two times a day (BID) | ORAL | Status: DC
  Administered 2019-02-06 – 2019-02-07 (×3): 50 mg via ORAL
  Filled 2019-02-06 (×3): qty 1

## 2019-02-06 NOTE — Progress Notes (Signed)
Hypoglycemic Event  CBG: 59  Treatment: 4 ounces of juice  Symptoms: none Follow-up CBG: Time: 0024 CBG Result:74  Possible Reasons for Event:   Comments/MD notified:E link Notified     Lester Kinsman

## 2019-02-06 NOTE — Progress Notes (Signed)
NAME:  Christina [name], MRN:  [id number], DOB:  1950/06/07, LOS: 2 ADMISSION DATE:  02/04/2019, CONSULTATION DATE:  02/05/2019 REFERRING MD:  Delora Fuel MD CHIEF COMPLAINT:  Acute respiratory distress    Brief History   69 yo F with a history of COPD, CHF (EF 45-50%), A.fib not on anticoagulation, ESRD on HD TTS who presented with acute respiratory distress, intubated quickly on arrival to the ED.   History of present illness   History is very limited due to patient's intubated status.  It appears that EMS was called to her house of shortness of breath.  On arrival to the ED, patient was agitated and uncooperative with trying to start BIPAP and decision was made to intubate.   Patient was recently discharged from [facility] on 1/2 after she was admitted for Afib with RVR.  She has a history of paroxysmal Afib and sees cardiology at [facility], is not on anticoagulation.   Past Medical History   Past Medical History:  Diagnosis Date  . Anemia   . Anxiety   . Arthritis   . Asthma   . Atrial fibrillation ([place])   . Chronic kidney disease    Dialysis T/Th/Sa  started in March 2020  . COPD (chronic obstructive pulmonary disease) ([place])   . Coronary artery disease    2 stents  . Depression   . GERD (gastroesophageal reflux disease)   . Gout   . Headache    migraines  . History of kidney stones   . Hyperlipidemia   . Hypertension   . Pneumonia   . Small bowel obstruction ([place])    Significant Hospital Events   Intubated in ED 1/8  Consults:  Nephrology  Procedures:  ETT 1/8 >> 1/9  Significant Diagnostic Tests:  1/8 CXR: reviewed.  IMPRESSION: 1. Endotracheal tube above the carina and enteric tube with tip beyond the inferior margin of the image. 2. Cardiomegaly with mild edema. Developing pneumonia is not Excluded.  BNP >4,000 (prior 1,500)  Micro Data:  BCx 1/8 > no growth <24hrs   Antimicrobials:  None   Interim history/subjective:  Overnight, patient with SBP in 170s for  which home IMDUR was restarted with improvement in BP. Also noted to have one hypoglycemic event for which given juice with improvement in CBG. Otherwise, patient reports no acute concerns and slept well overnight   Objective   Blood pressure (!) 161/58, pulse 94, temperature 98.6 F (37 C), resp. rate (!) 21, weight 48.3 kg, SpO2 92 %.    Vent Mode: PRVC FiO2 (%):  [32 %-40 %] 32 % Set Rate:  [22 bmp] 22 bmp Vt Set:  [400 mL] 400 mL PEEP:  [5 cmH20] 5 cmH20 Pressure Support:  [10 cmH20] 10 cmH20 Plateau Pressure:  [15 cmH20] 15 cmH20   Intake/Output Summary (Last 24 hours) at 02/06/2019 0717 Last data filed at 02/06/2019 8413 Gross per 24 hour  Intake 1952.51 ml  Output 2635 ml  Net -682.49 ml   Filed Weights   02/05/19 1140 02/06/19 0500 02/06/19 0600  Weight: 49.7 kg 49.8 kg 48.3 kg    Examination: Physical Exam  Constitutional: She is oriented to person, place, and time and well-developed, well-nourished, and in no distress.  HENT:  Head: Normocephalic and atraumatic.  Cardiovascular: Normal rate, normal heart sounds and intact distal pulses. An irregular rhythm present. Exam reveals no gallop and no friction rub.  No murmur heard. Pulmonary/Chest: Effort normal. She has no wheezes. She has rales.  Bibasilar  crackles on auscultation   Abdominal: Soft. Bowel sounds are normal.  Musculoskeletal:        General: No edema. Normal range of motion.  Neurological: She is alert and oriented to person, place, and time.  Skin: Skin is warm and dry.    Resolved Hospital Problem list   Acute respiratory failure   Assessment & Plan:  69 yo female with Hx of COPD, CHF and ESRD on HD TTS presented with acute respiratory distress and inability to tolerate BiPAP.   Acute respiratory insufficiency with CHF vs COPD exacerbation s/p extubation:  - CXR with hypervolemia with ?multifocal PNA vs aspiration pneumonitis; elevated BNP from baseline of 1200.  - BCx negative <24 hours -  Patient extubated yesterday and weaned to room air. She is doing well this morning and in no acute distress. - Bibasilar crackles on auscultation  P:  Continue Bronvana, pulmicort, atrovent and albuterol nebs  Continue Aztreonam and vancomycin to cover for HCAP Lasix 80mg  x2 today  [facility] hospitalist to assume patient care tomorrow 1/11  ESRD on HD TTS: - Nephrology on board and patient received dialysis yesterday with ~1L taken off prior to patient becoming hypotensive  P:  HD per nephrology Electrolyte replacement as needed  A.fib with RVR: resolved Patient has history of A.fib for which she is on metoprolol at home. Yesterday, patient developed A.fib with RVR requiring IV cardizem gtt. She has subsequently been weaned off the gtt and is on home dose of amlodpine, metoprolol and IMDUR. Currently rate controlled.  P:  - Metoprolol 50mg  bid  - Amlodipine 10mg  qd - IMDUR 30mg  qd  Diabetes mellitus: P:  Continue CBG monitoring SSI   Best practice:  Diet: renal w/fluid restriction Pain/Anxiety/Delirium protocol (if indicated): per protocol VAP protocol (if indicated): n/a DVT prophylaxis: heparin + SCDs GI prophylaxis: protonix Glucose control: SSI Mobility: OOB as tolerated Code Status: FULL Family Communication: patient and family updated  Disposition: Progressive  Labs   CBC: Recent Labs  Lab 02/04/19 0102 02/04/19 0235 02/04/19 0527 02/05/19 0131 02/05/19 0404 02/06/19 0341  WBC 19.2*  --   --  6.3  --  8.8  NEUTROABS 17.5*  --   --   --   --   --   HGB 12.1 11.9* 10.9* 11.0* 10.9* 10.7*  HCT 37.5 35.0* 32.0* 34.0* 32.0* 32.4*  MCV 97.4  --   --  92.4  --  93.1  PLT 230  --   --  218  --  937    Basic Metabolic Panel: Recent Labs  Lab 02/04/19 0102 02/04/19 0122 02/04/19 0235 02/04/19 0527 02/04/19 1214 02/04/19 1700 02/05/19 0131 02/05/19 0404 02/05/19 1704 02/06/19 0341  NA 138  --  136 136  --   --  140 141  --  134*  K 5.9*  --  5.8* 4.7  --    --  4.3 4.4  --  4.9  CL 103  --   --   --   --   --  104  --   --  98  CO2 22  --   --   --   --   --  20*  --   --  23  GLUCOSE 171*  --   --   --   --   --  124*  --   --  93  BUN 53*  --   --   --   --   --  70*  --   --  41*  CREATININE 6.21*  --   --   --   --   --  6.96*  --   --  4.50*  CALCIUM 7.8*  --   --   --   --   --  8.6*  --   --  8.2*  MG  --  1.9  --   --  2.0 2.0 2.2  --  1.9  --   PHOS  --   --   --   --  4.4 5.6* 6.8*  --  4.8*  --    GFR: Estimated Creatinine Clearance: 9.1 mL/min (A) (by C-G formula based on SCr of 4.5 mg/dL (H)). Recent Labs  Lab 02/04/19 0102 02/05/19 0131 02/06/19 0341  WBC 19.2* 6.3 8.8    Liver Function Tests: Recent Labs  Lab 02/04/19 0102 02/06/19 0341  AST 29 17  ALT 25 16  ALKPHOS 62 55  BILITOT 0.7 0.7  PROT 6.4* 5.7*  ALBUMIN 3.2* 2.7*   No results for input(s): LIPASE, AMYLASE in the last 168 hours. No results for input(s): AMMONIA in the last 168 hours.  ABG    Component Value Date/Time   PHART 7.423 02/05/2019 0404   PCO2ART 39.7 02/05/2019 0404   PO2ART 125.0 (H) 02/05/2019 0404   HCO3 26.0 02/05/2019 0404   TCO2 27 02/05/2019 0404   ACIDBASEDEF 2.0 02/04/2019 0527   O2SAT 99.0 02/05/2019 0404     Coagulation Profile: No results for input(s): INR, PROTIME in the last 168 hours.  Cardiac Enzymes: No results for input(s): CKTOTAL, CKMB, CKMBINDEX, TROPONINI in the last 168 hours.  HbA1C: Hgb A1c MFr Bld  Date/Time Value Ref Range Status  02/04/2019 05:06 AM 5.5 4.8 - 5.6 % Final    Comment:    (NOTE) Pre diabetes:          5.7%-6.4% Diabetes:              >6.4% Glycemic control for   <7.0% adults with diabetes   04/01/2008 02:30 AM  4.6 - 6.1 % Final   6.0 (NOTE)   The ADA recommends the following therapeutic goal for glycemic   control related to Hgb A1C measurement:   Goal of Therapy:   < 7.0% Hgb A1C   Reference: American Diabetes Association: Clinical Practice   Recommendations 2008, Diabetes  Care,  2008, 31:(Suppl 1).    CBG: Recent Labs  Lab 02/05/19 1644 02/05/19 2019 02/06/19 0004 02/06/19 0024 02/06/19 0336  GLUCAP 141* 142* 59* 74 86    Review of Systems:   Reviewed and negative.   Past Medical History  She,  has a past medical history of Anemia, Anxiety, Arthritis, Asthma, Atrial fibrillation ([place]), Chronic kidney disease, COPD (chronic obstructive pulmonary disease) ([place]), Coronary artery disease, Depression, GERD (gastroesophageal reflux disease), Gout, Headache, History of kidney stones, Hyperlipidemia, Hypertension, Pneumonia, and Small bowel obstruction ([place]).   Surgical History    Past Surgical History:  Procedure Laterality Date  . ABDOMINAL HYSTERECTOMY    . ABDOMINAL SURGERY     for small bowel obstruction - x 2  . APPENDECTOMY    . AV FISTULA PLACEMENT Left 08/04/2018   Procedure: ARTERIOVENOUS (AV) FISTULA CREATION LEFT ARM;  Surgeon: Waynetta Sandy, MD;  Location: [place];  Service: Vascular;  Laterality: Left;  . BASCILIC VEIN TRANSPOSITION Left 11/24/2018   Procedure: SECOND STAGE BASILIC VEIN TRANSPOSITION LEFT ARM;  Surgeon: Waynetta Sandy, MD;  Location: [place];  Service: Vascular;  Laterality:  Left;  . CARDIAC CATHETERIZATION    . CORONARY ANGIOPLASTY  ?2003/2004  . FACIAL RECONSTRUCTION SURGERY     x 2  . HERNIA REPAIR       Social History   reports that she has quit smoking. She has never used smokeless tobacco. She reports previous alcohol use. She reports previous drug use. Drug: Marijuana.   Family History   Her family history is not on file.   Allergies Allergies  Allergen Reactions  . Cyclobenzaprine Anaphylaxis and Other (See Comments)    "stopped heart"   . Morphine Anaphylaxis    "stopped heart"  . Penicillins Shortness Of Breath, Swelling and Palpitations    Did it involve swelling of the face/tongue/throat, SOB, or low BP? Yes Did it involve sudden or severe rash/hives, skin peeling, or any  reaction on the inside of your mouth or nose? No Did you need to seek medical attention at a hospital or doctor's office? No When did it last happen?years  If all above answers are "NO", may proceed with cephalosporin use.   . Codeine Itching and Rash  . Hydromorphone Other (See Comments)    If administered quickly, felt like hand was "exploding"      Home Medications  Prior to Admission medications   Medication Sig Start Date End Date Taking? Authorizing Provider  amLODipine (NORVASC) 10 MG tablet Take 10 mg by mouth daily.   Yes [provider]  albuterol (PROVENTIL) (2.5 MG/3ML) 0.083% nebulizer solution Take 2.5 mg by nebulization every 6 (six) hours as needed for wheezing or shortness of breath.    [provider]  allopurinol (ZYLOPRIM) 100 MG tablet Take 100 mg by mouth daily. 02/18/18   [provider]  aspirin EC 81 MG tablet Take 81 mg by mouth daily.    [provider]  AURYXIA 1 GM 210 MG(Fe) tablet Take 420 mg by mouth See admin instructions. Take 3 tablets (630 mg) by mouth with each meal & take 2 tablets (420 mg) by mouth with each snack 08/16/18   [provider]  Carboxymethylcellul-Glycerin (LUBRICATING EYE DROPS OP) Place 1 drop into both eyes daily as needed (dry eyes).    [provider]  Cholecalciferol (VITAMIN D3) 50 MCG (2000 UT) capsule Take 2,000 Units by mouth daily.     [provider]  dicyclomine (BENTYL) 10 MG capsule Take 20 mg by mouth at bedtime.  08/27/17   [provider]  docusate sodium (COLACE) 100 MG capsule Take 1 capsule (100 mg total) by mouth 2 (two) times daily. 08/11/18   Hongalgi, Lenis Dickinson, MD  doxylamine, Sleep, (UNISOM) 25 MG tablet Take 25 mg by mouth at bedtime as needed for sleep.    [provider]  furosemide (LASIX) 80 MG tablet Take 80 mg by mouth See admin instructions. Take one tablet on TUE THUR SAT before dialysis. 10/21/18   [provider]    gabapentin (NEURONTIN) 300 MG capsule Take 600 mg by mouth at bedtime as needed (leg pain.).  08/17/18   [provider]  Ipratropium-Albuterol (COMBIVENT) 20-100 MCG/ACT AERS respimat Inhale 2 puffs into the lungs 4 (four) times daily as needed for wheezing. 09/11/17   [provider]  isosorbide mononitrate (IMDUR) 30 MG 24 hr tablet Take 30 mg by mouth daily. 08/17/18   [provider]  labetalol (NORMODYNE) 200 MG tablet Take 200 mg by mouth 2 (two) times daily.    [provider]  lidocaine-prilocaine (EMLA) cream Apply  1 application topically daily as needed (1-2 hours before dialysis (cover with occlusive dressing)).  09/03/18   [provider]  metoprolol tartrate (LOPRESSOR) 100 MG tablet Take 1 tablet (100 mg total) by mouth 2 (two) times daily. 01/26/19   Little Ishikawa, MD  nitroGLYCERIN (NITROSTAT) 0.4 MG SL tablet Place 0.4 mg under the tongue every 5 (five) minutes as needed for chest pain.  09/18/16   [provider]  ondansetron (ZOFRAN) 4 MG tablet Take 4 mg by mouth 3 (three) times daily as needed for nausea. 09/14/18   [provider]  pantoprazole (PROTONIX) 40 MG tablet Take 1 tablet (40 mg total) by mouth daily. Patient taking differently: Take 40 mg by mouth daily as needed (acid reflux/indigestion.).  08/11/18   Hongalgi, Lenis Dickinson, MD  polyethylene glycol (MIRALAX / GLYCOLAX) 17 g packet Take 17 g by mouth 2 (two) times daily. Patient taking differently: Take 17 g by mouth every other day.  08/11/18   Hongalgi, Lenis Dickinson, MD  pravastatin (PRAVACHOL) 80 MG tablet Take 80 mg by mouth every evening.  06/30/16   [provider]  sennosides-docusate sodium (SENOKOT-S) 8.6-50 MG tablet Take 1 tablet by mouth 2 (two) times daily.     [provider]  SYMBICORT 160-4.5 MCG/ACT inhaler Inhale 2 puffs into the lungs every 4 (four) hours as needed for shortness of breath or wheezing. 09/16/18   [provider]     Critical care time:    CRITICAL CARE Performed by: Harvie Heck  Total critical care time: 35 minutes  Critical care was necessary to treat or prevent imminent or life-threatening deterioration. Critical care was time spent personally by me on the following activities: development of treatment plan with patient and/or surrogate as well as nursing, discussions with consultants, evaluation of patient's response to treatment, examination of patient, obtaining history from patient or surrogate, ordering and performing treatments and interventions, ordering and review of laboratory studies, ordering and review of radiographic studies, pulse oximetry and re-evaluation of patient's condition.   Harvie Heck, MD Internal Medicine, PGY-1 Pager # [phone] 02/06/2019 8:00AM

## 2019-02-06 NOTE — Progress Notes (Signed)
eLink Physician-Brief Progress Note Patient Name: Christina [name] DOB: 09-09-1950 MRN: [id number]   Date of Service  02/06/2019  HPI/Events of Note  RN requests consideration for change to QACHS glucose monitoring since patient is eating regular meals. Currently on Q4H FS.  eICU Interventions  Changed glucose monitoring to QACHS.     Intervention Category Minor Interventions: Routine modifications to care plan (e.g. PRN medications for pain, fever)  Marily Lente Quanita Barona 02/06/2019, 2:11 AM

## 2019-02-06 NOTE — Progress Notes (Addendum)
[facility] Progress Note  Name: Christina [name] MRN: [id number] DOB: 1950-08-07  Chief Complaint:  Shortness of breath  Subjective:  Patient had HD on 1/9 with only 1 kg UF.  Bedside nurse states had tachycardia to the 180's with HD and became hypotensive and received a bolus.  She was extubated yesterday to 3 liters and has been weaned to room air.  Still on dilt gtt.  She states they were due to cannulate her AVF this week at the HD unit.  Note that she had 1.6 liters UOP over 1/9.    Review of systems:  Denies shortness of breath  No chest pain  No n/v  ------------ Background on consult:  End-stage renal disease on hemodialysis Tuesday Thursday Saturday who presented to the ER with respiratory distress.  She was intolerant of BiPAP and ultimately was intubated.  She is also been started empirically on Vanco and aztreonam.    Intake/Output Summary (Last 24 hours) at 02/06/2019 0542 Last data filed at 02/06/2019 0400 Gross per 24 hour  Intake 1986.43 ml  Output 2310 ml  Net -323.57 ml    Vitals:  Vitals:   02/06/19 0200 02/06/19 0300 02/06/19 0400 02/06/19 0500  BP: (!) 173/78 (!) 177/77 (!) 164/61 (!) 171/69  Pulse: 89 89 84 84  Resp: (!) 21 14 15 15   Temp: 98.8 F (37.1 C) 98.8 F (37.1 C) 98.4 F (36.9 C) 98.8 F (37.1 C)  TempSrc:   Bladder   SpO2: 95% 92% 91% 94%  Weight:         Physical Exam:  General adult female in bed HEENT normocephalic atraumatic Lungs basilar crackles bilaterally; normal work of breathing on room air Heart S1S2 no rub  Abdomen soft nondistended NT Extremities no pitting edema  Neuro - alert and oriented and conversant Access: LUE AVF bruit and thrill; RIJ tunneled catheter  Medications reviewed   Labs:  BMP Latest Ref Rng & Units 02/05/2019 02/05/2019 02/04/2019  Glucose 70 - 99 mg/dL - 124(H) -  BUN 8 - 23 mg/dL - 70(H) -  Creatinine 0.44 - 1.00 mg/dL - 6.96(H) -  Sodium 135 - 145 mmol/L 141 140 136  Potassium  3.5 - 5.1 mmol/L 4.4 4.3 4.7  Chloride 98 - 111 mmol/L - 104 -  CO2 22 - 32 mmol/L - 20(L) -  Calcium 8.9 - 10.3 mg/dL - 8.6(L) -    Dialysis Orders: TTS at [facility]  ---> last HD 02/01/19, full treatment, post-weight 46.9kg 4hr, 400/A1.5, EDW 47kg, 2K/2Ca, UFP #4, TDC + maturing LUE AVF - Calcitriol 0.73mcg PO q HD   Assessment/Plan:   # End-stage renal disease on hemodialysis  - HD per TTS schedule - try to stick AVF for next treatment  - Standing daily weights  # Acute hypoxic respiratory failure  - on mechanical ventilation per pulmonary - Optimize volume status with HD - note hx COPD - ordered Lasix 80 mg IV x 2 today   # Hyperkalemia. resolved  # Hypertension - note home imdur was restarted.  Note her home med list has both metoprolol and labetalol - she states only on metoprolol. Will resume at a reduced dose of 50 mg BID for now  - lasix as above  # Anemia of CKD  - At goal on ESA regimen  # Secondary hyperparathyroidism vitamin D.  Phos improving.   Claudia Desanctis, MD 02/06/2019 7:10 AM

## 2019-02-06 NOTE — Plan of Care (Signed)
TRH pickup from PCCM on 1/11.  See TRH communication for further details.

## 2019-02-07 ENCOUNTER — Other Ambulatory Visit: Payer: Self-pay | Admitting: *Deleted

## 2019-02-07 DIAGNOSIS — I739 Peripheral vascular disease, unspecified: Secondary | ICD-10-CM

## 2019-02-07 DIAGNOSIS — J9601 Acute respiratory failure with hypoxia: Secondary | ICD-10-CM

## 2019-02-07 LAB — GLUCOSE, CAPILLARY
Glucose-Capillary: 35 mg/dL — CL (ref 70–99)
Glucose-Capillary: 157 mg/dL — ABNORMAL HIGH (ref 70–99)
Glucose-Capillary: 96 mg/dL (ref 70–99)
Glucose-Capillary: 98 mg/dL (ref 70–99)
Glucose-Capillary: 72 mg/dL (ref 70–99)

## 2019-02-07 LAB — TRIGLYCERIDES: Triglycerides: 89 mg/dL (ref <150)

## 2019-02-07 MED ORDER — BISACODYL 10 MG RE SUPP
10.0000 mg | Freq: Once | RECTAL | Status: DC
  Filled 2019-02-07: qty 1

## 2019-02-07 MED ORDER — POLYETHYLENE GLYCOL 3350 17 G PO PACK
17.0000 g | PACK | Freq: Every day | ORAL | Status: DC
  Filled 2019-02-07: qty 1

## 2019-02-07 MED ORDER — SALINE SPRAY 0.65 % NA SOLN
2.0000 | Freq: Two times a day (BID) | NASAL | Status: DC
  Filled 2019-02-07: qty 44

## 2019-02-07 NOTE — Progress Notes (Signed)
SATURATION QUALIFICATIONS: (This note is used to comply with regulatory documentation for home oxygen)  Patient Saturations on Room Air at Rest = 99%  Patient Saturations on Room Air while Ambulating = 95%  Patient Saturations on  Liters of oxygen while Ambulating = %  Please briefly explain why patient needs home oxygen:

## 2019-02-07 NOTE — Progress Notes (Signed)
Patient daughter Apolonio Schneiders called for an update will call back to talk to patient in the room.

## 2019-02-07 NOTE — TOC Transition Note (Signed)
Transition of Care [facility]) - CM/SW Discharge Note   Patient Details  Name: NALAYSIA MANGANIELLO MRN: [id number] Date of Birth: [birth year]  Transition of Care [facility]) CM/SW Contact:  Zenon Mayo, RN Phone Number: 02/07/2019, 12:24 PM   Clinical Narrative:    Patient for dc today, she states she is active with [facility] for [facility], would like to continue and add HHPT to these services.  NCM contacted [facility], left vm for return call.  Awaiting call back. She does not want outpatient pt. NCM spoke with Genie at [facility], they do not have her as being active.  NCM informed patient, she then states she has pcs services with them.  NCM made referral for HHPT, they are able to take referral with soc on 1/18.      Final next level of care: [place] Barriers to Discharge: No Barriers Identified   Patient Goals and CMS Choice Patient states their goals for this hospitalization and ongoing recovery are:: get better CMS Medicare.gov Compare Post Acute Care list provided to:: Patient Choice offered to / list presented to : Patient  Discharge Placement                       Discharge Plan and Services In-house Referral: NA Discharge Planning Services: CM Consult Post Acute Care Choice: Home Health          DME Arranged: (NA)         HH Arranged: PT HH Agency: [place] Date [place]: 02/07/19 Time [place]: 1224 Representative spoke with at [place]: [place] Determinants of Health ([place]) Interventions     Readmission Risk Interventions Readmission Risk Prevention Plan 02/07/2019  Transportation Screening Complete  Medication Review Press photographer) Complete  PCP or Specialist appointment within 3-5 days of discharge Complete  HRI or [place] Complete  SW Recovery Care/Counseling Consult Complete  [place] Not Applicable  Some recent data  might be hidden

## 2019-02-07 NOTE — Discharge Instructions (Signed)
Acute Respiratory Failure, Adult  Acute respiratory failure occurs when there is not enough oxygen passing from your lungs to your body. When this happens, your lungs have trouble removing carbon dioxide from the blood. This causes your blood oxygen level to drop too low as carbon dioxide builds up. Acute respiratory failure is a medical emergency. It can develop quickly, but it is temporary if treated promptly. Your lung capacity, or how much air your lungs can hold, may improve with time, exercise, and treatment. What are the causes? There are many possible causes of acute respiratory failure, including:  Lung injury.  Chest injury or damage to the ribs or tissues near the lungs.  Lung conditions that affect the flow of air and blood into and out of the lungs, such as pneumonia, acute respiratory distress syndrome, and cystic fibrosis.  Medical conditions, such as strokes or spinal cord injuries, that affect the muscles and nerves that control breathing.  Blood infection (sepsis).  Inflammation of the pancreas (pancreatitis).  A blood clot in the lungs (pulmonary embolism).  A large-volume blood transfusion.  Burns.  Near-drowning.  Seizure.  Smoke inhalation.  Reaction to medicines.  Alcohol or drug overdose. What increases the risk? This condition is more likely to develop in people who have:  A blocked airway.  Asthma.  A condition or disease that damages or weakens the muscles, nerves, bones, or tissues that are involved in breathing.  A serious infection.  A health problem that blocks the unconscious reflex that is involved in breathing, such as hypothyroidism or sleep apnea.  A lung injury or trauma. What are the signs or symptoms? Trouble breathing is the main symptom of acute respiratory failure. Symptoms may also include:  Rapid breathing.  Restlessness or anxiety.  Skin, lips, or fingernails that appear blue (cyanosis).  Rapid heart  rate.  Abnormal heart rhythms (arrhythmias).  Confusion or changes in behavior.  Tiredness or loss of energy.  Feeling sleepy or having a loss of consciousness. How is this diagnosed? Your health care provider can diagnose acute respiratory failure with a medical history and physical exam. During the exam, your health care provider will listen to your heart and check for crackling or wheezing sounds in your lungs. Your may also have tests to confirm the diagnosis and determine what is causing respiratory failure. These tests may include:  Measuring the amount of oxygen in your blood (pulse oximetry). The measurement comes from a small device that is placed on your finger, earlobe, or toe.  Other blood tests to measure blood gases and to look for signs of infection.  Sampling your cerebral spinal fluid or tracheal fluid to check for infections.  Chest X-ray to look for fluid in spaces that should be filled with air.  Electrocardiogram (ECG) to look at the heart's electrical activity. How is this treated? Treatment for this condition usually takes places in a hospital intensive care unit (ICU). Treatment depends on what is causing the condition. It may include one or more treatments until your symptoms improve. Treatment may include:  Supplemental oxygen. Extra oxygen is given through a tube in the nose, a face mask, or a hood.  A device such as a continuous positive airway pressure (CPAP) or bi-level positive airway pressure (BiPAP or BPAP) machine. This treatment uses mild air pressure to keep the airways open. A mask or other device will be placed over your nose or mouth. A tube that is connected to a motor will deliver oxygen through the   mask.  Ventilator. This treatment helps move air into and out of the lungs. This may be done with a bag and mask or a machine. For this treatment, a tube is placed in your windpipe (trachea) so air and oxygen can flow to the lungs.  Extracorporeal  membrane oxygenation (ECMO). This treatment temporarily takes over the function of the heart and lungs, supplying oxygen and removing carbon dioxide. ECMO gives the lungs a chance to recover. It may be used if a ventilator is not effective.  Tracheostomy. This is a procedure that creates a hole in the neck to insert a breathing tube.  Receiving fluids and medicines.  Rocking the bed to help breathing. Follow these instructions at home:  Take over-the-counter and prescription medicines only as told by your health care provider.  Return to normal activities as told by your health care provider. Ask your health care provider what activities are safe for you.  Keep all follow-up visits as told by your health care provider. This is important. How is this prevented? Treating infections and medical conditions that may lead to acute respiratory failure can help prevent the condition from developing. Contact a health care provider if:  You have a fever.  Your symptoms do not improve or they get worse. Get help right away if:  You are having trouble breathing.  You lose consciousness.  Your have cyanosis or turn blue.  You develop a rapid heart rate.  You are confused. These symptoms may represent a serious problem that is an emergency. Do not wait to see if the symptoms will go away. Get medical help right away. Call your local emergency services (911 in the [place] Do not drive yourself to the hospital. This information is not intended to replace advice given to you by your health care provider. Make sure you discuss any questions you have with your health care provider. Document Revised: 12/26/2016 Document Reviewed: 08/01/2015 Elsevier Patient Education  2020 [employer]  

## 2019-02-07 NOTE — Plan of Care (Signed)

## 2019-02-07 NOTE — Progress Notes (Signed)
Patient discharge information given with no questions. Patient transported to security  For belongings.

## 2019-02-07 NOTE — Progress Notes (Signed)
[facility] Progress Note  Name: Christina [name] MRN: [id number] DOB: Nov 02, 1950  Chief Complaint:  Shortness of breath  Subjective: seen in room, no c/o, feels "great" and wants to go home.   ------------ Background on consult:  End-stage renal disease on hemodialysis Tuesday Thursday Saturday who presented to the ER with respiratory distress.  She was intolerant of BiPAP and ultimately was intubated.  She is also been started empirically on Vanco and aztreonam.    Intake/Output Summary (Last 24 hours) at 02/07/2019 1122 Last data filed at 02/07/2019 1000 Gross per 24 hour  Intake 810.96 ml  Output 3175 ml  Net -2364.04 ml    Vitals:  Vitals:   02/07/19 0344 02/07/19 0748 02/07/19 0821 02/07/19 0900  BP: (!) 151/93 (!) 179/82 (!) 149/83   Pulse: 86 90 87 88  Resp: 18   18  Temp: 99.3 F (37.4 C) 98.7 F (37.1 C)    TempSrc: Oral Oral    SpO2: 93% 94% 99% 100%  Weight:         Physical Exam:  General adult female in bed HEENT normocephalic atraumatic Lungs basilar crackles bilaterally; normal work of breathing on room air Heart S1S2 no rub  Abdomen soft nondistended NT Extremities no pitting edema  Neuro - alert and oriented and conversant Access: LUE AVF bruit and thrill; RIJ tunneled catheter  Medications reviewed   Labs:  BMP Latest Ref Rng & Units 02/06/2019 02/05/2019 02/05/2019  Glucose 70 - 99 mg/dL 93 - 124(H)  BUN 8 - 23 mg/dL 41(H) - 70(H)  Creatinine 0.44 - 1.00 mg/dL 4.50(H) - 6.96(H)  Sodium 135 - 145 mmol/L 134(L) 141 140  Potassium 3.5 - 5.1 mmol/L 4.9 4.4 4.3  Chloride 98 - 111 mmol/L 98 - 104  CO2 22 - 32 mmol/L 23 - 20(L)  Calcium 8.9 - 10.3 mg/dL 8.2(L) - 8.6(L)    Dialysis Orders: TTS [facility]  ---> last HD 02/01/19, full treatment, post-weight 46.9kg 4hr, 400/A1.5, EDW 47kg, 2K/2Ca, UFP #4, TDC + maturing LUE AVF - Calcitriol 0.15mcg PO q HD   Assessment/Plan:   # End-stage renal disease on hemodialysis- usual HD TTS  #  Acute hypoxic respiratory failure - due to vol overload/ pulm edema, doing much better, on RA and exam neg for ^vol.  OK for dc from renal standpoint. Will go to usual OP HD tomorrow.   # Hyperkalemia. resolved  # Hypertension - resume home meds at dc  # Anemia of CKD  - At goal on ESA regimen  # Secondary hyperparathyroidism vitamin D.  Phos improving.   Sol Blazing, MD 02/07/2019 11:22 AM

## 2019-02-07 NOTE — Plan of Care (Signed)

## 2019-02-07 NOTE — TOC Initial Note (Addendum)
Transition of Care [facility]) - Initial/Assessment Note    Patient Details  Name: Christina [name] MRN: [id number] Date of Birth: 1950/03/13  Transition of Care [facility]) CM/SW Contact:    Zenon Mayo, RN Phone Number: 02/07/2019, 12:01 PM  Clinical Narrative:                 Patient for dc today, she states she is active with [facility] for [facility], would like to continue and add HHPT to these services.  NCM contacted [place], left vm for return call.  Awaiting call back. She does not want outpatient pt.   Expected Discharge Plan: [place] Barriers to Discharge: No Barriers Identified   Patient Goals and CMS Choice Patient states their goals for this hospitalization and ongoing recovery are:: go home CMS Medicare.gov Compare Post Acute Care list provided to:: Patient Choice offered to / list presented to : Patient  Expected Discharge Plan and Services Expected Discharge Plan: [place] In-house Referral: NA Discharge Planning Services: CM Consult Post Acute Care Choice: [place] arrangements for the past 2 months: Single Family Home Expected Discharge Date: 02/07/19               DME Arranged: (NA)         HH Arranged: RN, PT HH Agency: [facility]        Prior Living Arrangements/Services Living arrangements for the past 2 months: Single Family Home Lives with:: Self Patient language and need for interpreter reviewed:: Yes Do you feel safe going back to the place where you live?: Yes      Need for Family Participation in Patient Care: Yes (Comment) Care giver support system in place?: Yes (comment) Current home services: Home RN([facility]) Criminal Activity/Legal Involvement Pertinent to Current Situation/Hospitalization: No - Comment as needed  Activities of Daily Living Home Assistive Devices/Equipment: Dentures (specify type) ADL Screening (condition at time of admission) Patient's cognitive  ability adequate to safely complete daily activities?: Yes Is the patient deaf or have difficulty hearing?: No Does the patient have difficulty seeing, even when wearing glasses/contacts?: Yes Does the patient have difficulty concentrating, remembering, or making decisions?: Yes Patient able to express need for assistance with ADLs?: Yes Does the patient have difficulty dressing or bathing?: No Independently performs ADLs?: Yes (appropriate for developmental age) Does the patient have difficulty walking or climbing stairs?: No Weakness of Legs: None Weakness of Arms/Hands: None  Permission Sought/Granted                  Emotional Assessment   Attitude/Demeanor/Rapport: Engaged Affect (typically observed): Appropriate Orientation: : Oriented to Self, Oriented to Place, Oriented to  Time, Oriented to Situation Alcohol / Substance Use: Not Applicable Psych Involvement: No (comment)  Admission diagnosis:  Acute on chronic systolic heart failure (HCC) [I50.23] Respiratory failure (HCC) [J96.90] Hyperkalemia [E87.5] End-stage renal disease on hemodialysis ([place]) [N18.6, Z99.2] Nonspecific chest pain [R07.9] Acute and chr resp failure, unsp w hypoxia or hypercapnia ([place]) [J96.20] Patient Active Problem List   Diagnosis Date Noted  . Respiratory failure ([place]) 02/04/2019  . Acute and chr resp failure, unsp w hypoxia or hypercapnia ([place])   . Atrial fibrillation with RVR ([place]) 01/25/2019  . SBO (small bowel obstruction) ([place]) 08/10/2018  . Anemia due to chronic blood loss   . Heme positive stool   . Platelet inhibition due to Plavix   . Acute GI bleeding 05/06/2018  . ESRD (  end stage renal disease) ([place]) 05/06/2018  . Anxiety 05/06/2018  . Bipolar affective ([place]) 05/06/2018  . CAD (coronary artery disease), native coronary artery 05/06/2018  . 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 CVA (cerebral vascular accident) ([place]) 05/06/2018  . Essential hypertension 05/06/2018   . Hyperlipidemia 05/06/2018   PCP:  Maggie Schwalbe, PA-C Pharmacy:   [place], [address] [place] [address] Phone: [phone] Fax: [phone]     Social Determinants of Health (SDOH) Interventions    Readmission Risk Interventions Readmission Risk Prevention Plan 02/07/2019  Transportation Screening Complete  Medication Review ([place]) Complete  PCP or Specialist appointment within 3-5 days of discharge Complete  HRI or [place] Complete  SW Recovery Care/Counseling Consult Complete  [place] Not Applicable  Some recent data might be hidden

## 2019-02-07 NOTE — Evaluation (Signed)
Physical Therapy Evaluation Patient Details Name: Christina [name] MRN: [id number] DOB: 09-Nov-1950 Today's Date: 02/07/2019   History of Present Illness  69yo female brought to the ED via EMS due to acute SOB. Agitated and uncooperative in the ED, intubated 02/03/18 and extubated 02/04/18. Covid negative in ED. Admitted for acute respiratory insufficiency. PMH SBO, HTN, HLD, COPD, CKD, A-fib, cardiac cath  Clinical Impression   Patient received in bed, very pleasant and cooperative and excited to participate in mobility. Able to complete bed mobility with mod(I), functional transfers and gait approximately 252f with no device and min guard for safety. Note mild generalized weakness and balance impairment, as well as mild knee pain with mobility. No signs of hypoxia or SOB with gait on room air. She was left up in the chair with all needs met this morning. Currently recommend skilled OP PT services moving forward.     Follow Up Recommendations Outpatient PT    Equipment Recommendations  None recommended by PT    Recommendations for Other Services       Precautions / Restrictions Precautions Precautions: Fall Restrictions Weight Bearing Restrictions: No      Mobility  Bed Mobility Overal bed mobility: Modified Independent             General bed mobility comments: increased effort, time  Transfers Overall transfer level: Needs assistance Equipment used: None Transfers: Sit to/from Stand Sit to Stand: Min guard         General transfer comment: min guard for safety, no physical assist given  Ambulation/Gait Ambulation/Gait assistance: Min guard Gait Distance (Feet): 200 Feet Assistive device: None Gait Pattern/deviations: Step-through pattern;Trendelenburg;Narrow base of support Gait velocity: decreased   General Gait Details: gait pattern grossly WNL but does appear to have some significant weakness in BLEs and hip stabilizers, no SOB or signs of  hypoxemia  Stairs            Wheelchair Mobility    Modified Rankin (Stroke Patients Only)       Balance Overall balance assessment: Mild deficits observed, not formally tested                                           Pertinent Vitals/Pain Pain Assessment: Faces Faces Pain Scale: Hurts a little bit Pain Location: knees with mobility, stiff Pain Descriptors / Indicators: Tightness Pain Intervention(s): Limited activity within patient's tolerance;Monitored during session    Home Living Family/patient expects to be discharged to:: Private residence Living Arrangements: Alone;Children(daughters are not local)   Type of Home: Apartment Home Access: Level entry     Home Layout: One level Home Equipment: Walker - 2 wheels;Cane - single point Additional Comments: only uses devices "when I need them"    Prior Function Level of Independence: Independent               Hand Dominance        Extremity/Trunk Assessment                Communication   Communication: No difficulties  Cognition Arousal/Alertness: Awake/alert Behavior During Therapy: WFL for tasks assessed/performed Overall Cognitive Status: Within Functional Limits for tasks assessed  General Comments      Exercises     Assessment/Plan    PT Assessment Patient needs continued PT services  PT Problem List Decreased strength;Decreased activity tolerance;Decreased balance       PT Treatment Interventions DME instruction;Balance training;Gait training;Stair training;Functional mobility training;Therapeutic activities;Patient/family education;Therapeutic exercise    PT Goals (Current goals can be found in the Care Plan section)  Acute Rehab PT Goals Patient Stated Goal: go home PT Goal Formulation: With patient Time For Goal Achievement: 02/21/19 Potential to Achieve Goals: Good    Frequency Min 3X/week    Barriers to discharge        Co-evaluation               AM-PAC PT "6 Clicks" Mobility  Outcome Measure Help needed turning from your back to your side while in a flat bed without using bedrails?: None Help needed moving from lying on your back to sitting on the side of a flat bed without using bedrails?: None Help needed moving to and from a bed to a chair (including a wheelchair)?: A Little Help needed standing up from a chair using your arms (e.g., wheelchair or bedside chair)?: A Little Help needed to walk in hospital room?: A Little Help needed climbing 3-5 steps with a railing? : A Little 6 Click Score: 20    End of Session Equipment Utilized During Treatment: Gait belt Activity Tolerance: Patient tolerated treatment well Patient left: in chair;with call bell/phone within reach   PT Visit Diagnosis: Muscle weakness (generalized) (M62.81)    Time: 4174-0814 PT Time Calculation (min) (ACUTE ONLY): 20 min   Charges:   PT Evaluation $PT Eval Low Complexity: 1 Low          Windell Norfolk, DPT, PN1   Supplemental Physical Therapist [place]    Pager [phone] Acute Rehab Office [phone]

## 2019-02-07 NOTE — Progress Notes (Signed)
Patient foley catheter removed Hospitalist and Nephrologist Ordered per protocol. Laxative also ordered for chronic constipation, plan for PT to evaluate today and possible discharged later per MD.

## 2019-02-07 NOTE — Progress Notes (Signed)
Called and updated daughter.  

## 2019-02-07 NOTE — Discharge Summary (Addendum)
Physician Discharge Summary       Patient ID: Christina [name] MRN: [id number] DOB/AGE: Mar 31, 1950 69 y.o.  Admit date: 02/04/2019 Discharge date: 02/07/2019  Discharge Diagnoses:  Active Problems:   Respiratory failure (HCC)   History of Present Illness:  69 year old female with ESRD on HD presented to [facility] ED 02/04/2019 via EMS. EMS was dispatched for respiratory distress and was placed on BiPAP en route to ED, but was promptly intubated upon arrival. Imaging in the ED was concerning for volume overload and multifocal opacification. She required minimal vent support. She was covered broadly with antibiotics and was admitted to ICU for closer monitoring. Last HD was 1/5 prior to admission, missed HD 1/7.   Hospital Course:  Patient was briefly intubated but quickly improved with volume removal by HD.  Hospital course complicated by Afib/RVR, reactive that responded to CCB drip which was transitioned back to home beta blocker. She has a history of this problem and was last seen by Dr. Abran Richard at [facility].  She is not on anticoagulation due to a history of significant GIB.  Once euvolemia was achieved patient quickly returned to baseline physical/mental status.  All of her home meds were resumed and she will continue her Tues/Thurs/Sat HD in [place].  Outpatient PT referral made.  Discharge Exam: BP (!) 149/83   Pulse 88   Temp 98.7 F (37.1 C) (Oral)   Resp 18   Wt 47.9 kg Comment: scale b  SpO2 100%   BMI 19.31 kg/m  Chronically ill woman in NAD MMM, trachea midline Heart sounds regular, ext warm, loud P2 No peripheral edema Moves all 4 ext to command [facility]  Labs at discharge Lab Results  Component Value Date   CREATININE 4.50 (H) 02/06/2019   BUN 41 (H) 02/06/2019   NA 134 (L) 02/06/2019   K 4.9 02/06/2019   CL 98 02/06/2019   CO2 23 02/06/2019   Lab Results  Component Value Date   WBC 8.8 02/06/2019   HGB 10.7 (L) 02/06/2019   HCT  32.4 (L) 02/06/2019   MCV 93.1 02/06/2019   PLT 192 02/06/2019   Lab Results  Component Value Date   ALT 16 02/06/2019   AST 17 02/06/2019   ALKPHOS 55 02/06/2019   BILITOT 0.7 02/06/2019   Lab Results  Component Value Date   INR 0.9 01/26/2019   INR 1.0 01/25/2019   INR 0.9 04/01/2008    Current radiology studies No results found.  Disposition:   Discharge disposition: 01-Home or Self Care       Discharge Instructions    Ambulatory referral to Physical Therapy   Complete by: As directed      Allergies as of 02/07/2019      Reactions   Cyclobenzaprine Anaphylaxis, Other (See Comments)   "stopped heart"   Morphine Anaphylaxis   "stopped heart"   Penicillins Shortness Of Breath, Swelling, Palpitations   Did it involve swelling of the face/tongue/throat, SOB, or low BP? Yes Did it involve sudden or severe rash/hives, skin peeling, or any reaction on the inside of your mouth or nose? No Did you need to seek medical attention at a hospital or doctor's office? No When did it last happen?years  If all above answers are "NO", may proceed with cephalosporin use.   Codeine Itching, Rash   Hydromorphone Other (See Comments)   If administered quickly, felt like hand was "exploding"       Medication List  TAKE these medications   albuterol (2.5 MG/3ML) 0.083% nebulizer solution Commonly known as: PROVENTIL Take 2.5 mg by nebulization every 6 (six) hours as needed for wheezing or shortness of breath.   allopurinol 100 MG tablet Commonly known as: ZYLOPRIM Take 100 mg by mouth daily.   amLODipine 10 MG tablet Commonly known as: NORVASC Take 10 mg by mouth daily.   aspirin EC 81 MG tablet Take 81 mg by mouth daily.   Auryxia 1 GM 210 MG(Fe) tablet Generic drug: ferric citrate Take 420 mg by mouth See admin instructions. Take 3 tablets (630 mg) by mouth with each meal & take 2 tablets (420 mg) by mouth with each snack   dicyclomine 10 MG  capsule Commonly known as: BENTYL Take 20 mg by mouth at bedtime.   docusate sodium 100 MG capsule Commonly known as: COLACE Take 1 capsule (100 mg total) by mouth 2 (two) times daily.   doxylamine (Sleep) 25 MG tablet Commonly known as: UNISOM Take 25 mg by mouth at bedtime as needed for sleep.   furosemide 80 MG tablet Commonly known as: LASIX Take 80 mg by mouth See admin instructions. Take one tablet (40 mg totally) by mouth on TUE THUR SAT before dialysis.   gabapentin 300 MG capsule Commonly known as: NEURONTIN Take 600 mg by mouth at bedtime as needed (leg pain.).   Ipratropium-Albuterol 20-100 MCG/ACT Aers respimat Commonly known as: COMBIVENT Inhale 2 puffs into the lungs 4 (four) times daily as needed for wheezing.   isosorbide mononitrate 30 MG 24 hr tablet Commonly known as: IMDUR Take 30 mg by mouth daily.   lidocaine-prilocaine cream Commonly known as: EMLA Apply 1 application topically daily as needed (1-2 hours before dialysis (cover with occlusive dressing)).   LUBRICATING EYE DROPS OP Place 1 drop into both eyes daily as needed (dry eyes).   metoprolol tartrate 100 MG tablet Commonly known as: LOPRESSOR Take 1 tablet (100 mg total) by mouth 2 (two) times daily.   nitroGLYCERIN 0.4 MG SL tablet Commonly known as: NITROSTAT Place 0.4 mg under the tongue every 5 (five) minutes as needed for chest pain.   ondansetron 4 MG tablet Commonly known as: ZOFRAN Take 4 mg by mouth 3 (three) times daily as needed for nausea.   pantoprazole 40 MG tablet Commonly known as: PROTONIX Take 1 tablet (40 mg total) by mouth daily. What changed:   when to take this  reasons to take this   polyethylene glycol 17 g packet Commonly known as: MIRALAX / GLYCOLAX Take 17 g by mouth 2 (two) times daily. What changed: when to take this   pravastatin 80 MG tablet Commonly known as: PRAVACHOL Take 80 mg by mouth every evening.   sennosides-docusate sodium 8.6-50 MG  tablet Commonly known as: SENOKOT-S Take 1 tablet by mouth 2 (two) times daily.   Symbicort 160-4.5 MCG/ACT inhaler Generic drug: budesonide-formoterol Inhale 2 puffs into the lungs every 4 (four) hours as needed (for wheezing).   Vitamin D3 50 MCG (2000 UT) capsule Take 2,000 Units by mouth daily.      Follow-up Information    Nodal, [name], PA-C. Schedule an appointment as soon as possible for a visit in 2 week(s).   Specialty: Physician Assistant Contact information: [place] [address] [phone]        Flossie Buffy., MD Follow up in 2 week(s).   Specialty: Cardiology Why: to discuss your atrial fibrillation Contact information: [place]  [place] [id number] [facility].   Why: for usual HD Contact information: [place] [address] [phone]           Discharged Condition: good  Greater than 35 minutes of time have been dedicated to discharge assessment, planning and discharge instructions.   Signed:  Erskine Emery MD PCCM  See Amion for personal pager PCCM on call pager [phone]  02/07/2019 10:56 AM

## 2019-02-07 NOTE — Progress Notes (Signed)
Occupational Therapy Evaluation Patient Details Name: Christina [name] MRN: [id number] DOB: 08-Apr-1950 Today's Date: 02/07/2019    History of Present Illness 69yo female brought to the ED via EMS due to acute SOB. Agitated and uncooperative in the ED, intubated 02/03/18 and extubated 02/04/18. Covid negative in ED. Admitted for acute respiratory insufficiency. PMH SBO, HTN, HLD, COPD, CKD, A-fib, cardiac cath   Clinical Impression   PTA, pt was living at home alone, she reports she was independent with ADL/IADL and functional mobility with intermittent use of AD. Pt reports her daughter lives close by and is able to drive her to appointments. Pt currently requires minguard for ADL and functional mobility. She demonstrates decreased activity tolerance, SpO2 94% at rest, 88% RA following simulated toilet transfer and in room mobility for 5 min. Pt educated on energy conservation strategies with provided handout. Pt would benefit from continued skilled OT services to address balance, activity tolerance, and energy conservation and safety during ADL/IADL and functional mobility to facilitate safe D/C to venue listed below. At this time, recommend Outpatient follow-up. No additional acute needs identified at this time, all additional OT needs to be addressed at next venue of care. Thank you for referral.     Follow Up Recommendations  Outpatient OT    Equipment Recommendations  None recommended by OT    Recommendations for Other Services       Precautions / Restrictions Precautions Precautions: Fall Restrictions Weight Bearing Restrictions: No      Mobility Bed Mobility Overal bed mobility: Modified Independent             General bed mobility comments: sitting EOB upon arrival  Transfers Overall transfer level: Needs assistance Equipment used: None Transfers: Sit to/from Stand Sit to Stand: Min guard         General transfer comment: min guard for safety, no physical assist  given    Balance Overall balance assessment: Mild deficits observed, not formally tested                                         ADL either performed or assessed with clinical judgement   ADL Overall ADL's : At baseline                                       General ADL Comments: pt reports she is functioning at baseline level;demonstrated minguard with tub transfer;she demonstrates mild instability but was not formally assessed;discussed use of energy conservation strategies during ADL/IADL and functional mobiltiy with provided handout;pt dressed upon arrival, reports she dressed herself     Vision Baseline Vision/History: Wears glasses Wears Glasses: At all times Patient Visual Report: No change from baseline Vision Assessment?: No apparent visual deficits     Perception     Praxis      Pertinent Vitals/Pain Pain Assessment: No/denies pain Faces Pain Scale: Hurts a little bit Pain Location: knees with mobility, stiff Pain Descriptors / Indicators: Tightness Pain Intervention(s): Monitored during session     Hand Dominance Right   Extremity/Trunk Assessment Upper Extremity Assessment Upper Extremity Assessment: Overall WFL for tasks assessed   Lower Extremity Assessment Lower Extremity Assessment: Overall WFL for tasks assessed   Cervical / Trunk Assessment Cervical / Trunk Assessment: Normal   Communication Communication Communication: No difficulties  Cognition Arousal/Alertness: Awake/alert Behavior During Therapy: WFL for tasks assessed/performed Overall Cognitive Status: Within Functional Limits for tasks assessed                                 General Comments: pt with good understanding of importance of O2 level; pt curious and asking questions about energy conservation strategies and investing in pulse oximeter   General Comments  SpO2 94%-88% RA during mobiltiy;reviewed energy conservation strategies  with provided handout;pt reports planning for purchase of pulse oximeter at later time    Exercises     Shoulder Instructions      Home Living Family/patient expects to be discharged to:: Private residence Living Arrangements: Alone;Children(daughters are not local)   Type of Home: Apartment Home Access: Level entry     Home Layout: One level     Bathroom Shower/Tub: Teacher, early years/pre: Handicapped height     Home Equipment: Environmental consultant - 2 wheels;Cane - single point   Additional Comments: only uses devices "when I need them"      Prior Functioning/Environment Level of Independence: Independent                 OT Problem List: Decreased activity tolerance;Impaired balance (sitting and/or standing);Cardiopulmonary status limiting activity      OT Treatment/Interventions: Self-care/ADL training;Therapeutic exercise;Energy conservation;Therapeutic activities;Patient/family education;Balance training    OT Goals(Current goals can be found in the care plan section) Acute Rehab OT Goals Patient Stated Goal: go home OT Goal Formulation: With patient Time For Goal Achievement: 02/21/19 Potential to Achieve Goals: Good  OT Frequency: Min 2X/week   Barriers to D/C: Decreased caregiver support  pt lives alone       Co-evaluation              AM-PAC OT "6 Clicks" Daily Activity     Outcome Measure Help from another person eating meals?: None Help from another person taking care of personal grooming?: A Little Help from another person toileting, which includes using toliet, bedpan, or urinal?: A Little Help from another person bathing (including washing, rinsing, drying)?: A Little Help from another person to put on and taking off regular upper body clothing?: A Little Help from another person to put on and taking off regular lower body clothing?: A Little 6 Click Score: 19   End of Session Equipment Utilized During Treatment: Gait belt Nurse  Communication: Mobility status  Activity Tolerance: Patient tolerated treatment well Patient left: in bed;with call bell/phone within reach  OT Visit Diagnosis: Unsteadiness on feet (R26.81);Other abnormalities of gait and mobility (R26.89)                Time: 2505-3976 OT Time Calculation (min): 21 min Charges:  OT General Charges $OT Visit: 1 Visit OT Evaluation $OT Eval Moderate Complexity: [facility] OTR/L Acute Rehabilitation Services Office: [place] 02/07/2019, 12:58 PM

## 2019-02-09 LAB — CULTURE, BLOOD (ROUTINE X 2)
Culture: NO GROWTH
Culture: NO GROWTH
Special Requests: ADEQUATE

## 2019-02-21 ENCOUNTER — Emergency Department (HOSPITAL_COMMUNITY): Payer: Medicare Other

## 2019-02-21 ENCOUNTER — Observation Stay (HOSPITAL_COMMUNITY)
Admission: EM | Admit: 2019-02-21 | Discharge: 2019-02-23 | Disposition: A | Discharge status: Home / Self Care | Payer: Medicare Other | Attending: Student | Admitting: Student

## 2019-02-21 ENCOUNTER — Other Ambulatory Visit: Payer: Self-pay

## 2019-02-21 ENCOUNTER — Encounter (HOSPITAL_COMMUNITY): Payer: Self-pay | Admitting: *Deleted

## 2019-02-21 DIAGNOSIS — Z7982 Long term (current) use of aspirin: Secondary | ICD-10-CM | POA: Diagnosis not present

## 2019-02-21 DIAGNOSIS — F319 Bipolar disorder, unspecified: Secondary | ICD-10-CM | POA: Diagnosis not present

## 2019-02-21 DIAGNOSIS — Z955 Presence of coronary angioplasty implant and graft: Secondary | ICD-10-CM | POA: Insufficient documentation

## 2019-02-21 DIAGNOSIS — Z87891 Personal history of nicotine dependence: Secondary | ICD-10-CM | POA: Diagnosis not present

## 2019-02-21 DIAGNOSIS — Z8673 Personal history of transient ischemic attack (TIA), and cerebral infarction without residual deficits: Secondary | ICD-10-CM | POA: Diagnosis not present

## 2019-02-21 DIAGNOSIS — I480 Paroxysmal atrial fibrillation: Secondary | ICD-10-CM | POA: Diagnosis present

## 2019-02-21 DIAGNOSIS — I4891 Unspecified atrial fibrillation: Secondary | ICD-10-CM | POA: Diagnosis present

## 2019-02-21 DIAGNOSIS — E785 Hyperlipidemia, unspecified: Secondary | ICD-10-CM | POA: Diagnosis not present

## 2019-02-21 DIAGNOSIS — G92 Toxic encephalopathy: Secondary | ICD-10-CM | POA: Diagnosis not present

## 2019-02-21 DIAGNOSIS — I2510 Atherosclerotic heart disease of native coronary artery without angina pectoris: Secondary | ICD-10-CM | POA: Insufficient documentation

## 2019-02-21 DIAGNOSIS — Z7951 Long term (current) use of inhaled steroids: Secondary | ICD-10-CM | POA: Insufficient documentation

## 2019-02-21 DIAGNOSIS — M109 Gout, unspecified: Secondary | ICD-10-CM | POA: Diagnosis not present

## 2019-02-21 DIAGNOSIS — N186 End stage renal disease: Secondary | ICD-10-CM | POA: Diagnosis present

## 2019-02-21 DIAGNOSIS — F419 Anxiety disorder, unspecified: Secondary | ICD-10-CM | POA: Insufficient documentation

## 2019-02-21 DIAGNOSIS — D3501 Benign neoplasm of right adrenal gland: Secondary | ICD-10-CM | POA: Diagnosis not present

## 2019-02-21 DIAGNOSIS — Z992 Dependence on renal dialysis: Secondary | ICD-10-CM | POA: Diagnosis not present

## 2019-02-21 DIAGNOSIS — R109 Unspecified abdominal pain: Secondary | ICD-10-CM | POA: Diagnosis present

## 2019-02-21 DIAGNOSIS — K859 Acute pancreatitis without necrosis or infection, unspecified: Secondary | ICD-10-CM | POA: Diagnosis present

## 2019-02-21 DIAGNOSIS — Z87442 Personal history of urinary calculi: Secondary | ICD-10-CM | POA: Insufficient documentation

## 2019-02-21 DIAGNOSIS — Z66 Do not resuscitate: Secondary | ICD-10-CM | POA: Insufficient documentation

## 2019-02-21 DIAGNOSIS — I700 Atherosclerosis of aorta: Secondary | ICD-10-CM | POA: Insufficient documentation

## 2019-02-21 DIAGNOSIS — Z79899 Other long term (current) drug therapy: Secondary | ICD-10-CM | POA: Insufficient documentation

## 2019-02-21 DIAGNOSIS — M1990 Unspecified osteoarthritis, unspecified site: Secondary | ICD-10-CM | POA: Insufficient documentation

## 2019-02-21 DIAGNOSIS — D3502 Benign neoplasm of left adrenal gland: Secondary | ICD-10-CM | POA: Insufficient documentation

## 2019-02-21 DIAGNOSIS — K219 Gastro-esophageal reflux disease without esophagitis: Secondary | ICD-10-CM | POA: Diagnosis not present

## 2019-02-21 DIAGNOSIS — Z20822 Contact with and (suspected) exposure to covid-19: Secondary | ICD-10-CM | POA: Insufficient documentation

## 2019-02-21 DIAGNOSIS — I1311 Hypertensive heart and chronic kidney disease without heart failure, with stage 5 chronic kidney disease, or end stage renal disease: Secondary | ICD-10-CM | POA: Insufficient documentation

## 2019-02-21 DIAGNOSIS — J4489 Other specified chronic obstructive pulmonary disease: Secondary | ICD-10-CM | POA: Diagnosis present

## 2019-02-21 DIAGNOSIS — Z91158 Patient's noncompliance with renal dialysis for other reason: Secondary | ICD-10-CM

## 2019-02-21 DIAGNOSIS — E8770 Fluid overload, unspecified: Secondary | ICD-10-CM | POA: Insufficient documentation

## 2019-02-21 DIAGNOSIS — J449 Chronic obstructive pulmonary disease, unspecified: Secondary | ICD-10-CM | POA: Insufficient documentation

## 2019-02-21 DIAGNOSIS — Z9115 Patient's noncompliance with renal dialysis: Secondary | ICD-10-CM | POA: Insufficient documentation

## 2019-02-21 DIAGNOSIS — I080 Rheumatic disorders of both mitral and aortic valves: Secondary | ICD-10-CM | POA: Insufficient documentation

## 2019-02-21 DIAGNOSIS — I10 Essential (primary) hypertension: Secondary | ICD-10-CM | POA: Diagnosis present

## 2019-02-21 LAB — URINALYSIS, ROUTINE W REFLEX MICROSCOPIC
Bilirubin Urine: NEGATIVE
Glucose, UA: NEGATIVE mg/dL
Hgb urine dipstick: NEGATIVE
Ketones, ur: NEGATIVE mg/dL
Leukocytes,Ua: NEGATIVE
Nitrite: NEGATIVE
Protein, ur: 100 mg/dL — AB
Specific Gravity, Urine: 1.006 (ref 1.005–1.030)
pH: 7 (ref 5.0–8.0)

## 2019-02-21 LAB — CBC
HCT: 35.5 % — ABNORMAL LOW (ref 36.0–46.0)
Hemoglobin: 11.5 g/dL — ABNORMAL LOW (ref 12.0–15.0)
MCH: 30.8 pg (ref 26.0–34.0)
MCHC: 32.4 g/dL (ref 30.0–36.0)
MCV: 95.2 fL (ref 80.0–100.0)
Platelets: 280 10*3/uL (ref 150–400)
RBC: 3.73 MIL/uL — ABNORMAL LOW (ref 3.87–5.11)
RDW: 15.4 % (ref 11.5–15.5)
WBC: 8.3 10*3/uL (ref 4.0–10.5)
nRBC: 0 % (ref 0.0–0.2)

## 2019-02-21 LAB — COMPREHENSIVE METABOLIC PANEL
ALT: 12 U/L (ref 0–44)
AST: 18 U/L (ref 15–41)
Albumin: 3.3 g/dL — ABNORMAL LOW (ref 3.5–5.0)
Alkaline Phosphatase: 67 U/L (ref 38–126)
Anion gap: 16 — ABNORMAL HIGH (ref 5–15)
BUN: 59 mg/dL — ABNORMAL HIGH (ref 8–23)
CO2: 21 mmol/L — ABNORMAL LOW (ref 22–32)
Calcium: 9.2 mg/dL (ref 8.9–10.3)
Chloride: 100 mmol/L (ref 98–111)
Creatinine, Ser: 6.57 mg/dL — ABNORMAL HIGH (ref 0.44–1.00)
GFR calc Af Amer: 7 mL/min — ABNORMAL LOW (ref >60)
GFR calc non Af Amer: 6 mL/min — ABNORMAL LOW (ref >60)
Glucose, Bld: 124 mg/dL — ABNORMAL HIGH (ref 70–99)
Potassium: 5.2 mmol/L — ABNORMAL HIGH (ref 3.5–5.1)
Sodium: 137 mmol/L (ref 135–145)
Total Bilirubin: 1 mg/dL (ref 0.3–1.2)
Total Protein: 7.3 g/dL (ref 6.5–8.1)

## 2019-02-21 LAB — LACTIC ACID, PLASMA
Lactic Acid, Venous: 1 mmol/L (ref 0.5–1.9)
Lactic Acid, Venous: 1.1 mmol/L (ref 0.5–1.9)

## 2019-02-21 LAB — CBC WITH DIFFERENTIAL/PLATELET
Abs Immature Granulocytes: 0.05 10*3/uL (ref 0.00–0.07)
Basophils Absolute: 0 10*3/uL (ref 0.0–0.1)
Basophils Relative: 0 %
Eosinophils Absolute: 0.2 10*3/uL (ref 0.0–0.5)
Eosinophils Relative: 2 %
HCT: 39.1 % (ref 36.0–46.0)
Hemoglobin: 12.6 g/dL (ref 12.0–15.0)
Immature Granulocytes: 1 %
Lymphocytes Relative: 12 %
Lymphs Abs: 1.3 10*3/uL (ref 0.7–4.0)
MCH: 30.8 pg (ref 26.0–34.0)
MCHC: 32.2 g/dL (ref 30.0–36.0)
MCV: 95.6 fL (ref 80.0–100.0)
Monocytes Absolute: 1.2 10*3/uL — ABNORMAL HIGH (ref 0.1–1.0)
Monocytes Relative: 11 %
Neutro Abs: 8.1 10*3/uL — ABNORMAL HIGH (ref 1.7–7.7)
Neutrophils Relative %: 74 %
Platelets: 287 10*3/uL (ref 150–400)
RBC: 4.09 MIL/uL (ref 3.87–5.11)
RDW: 15.5 % (ref 11.5–15.5)
WBC: 10.8 10*3/uL — ABNORMAL HIGH (ref 4.0–10.5)
nRBC: 0 % (ref 0.0–0.2)

## 2019-02-21 LAB — MAGNESIUM: Magnesium: 2 mg/dL (ref 1.7–2.4)

## 2019-02-21 LAB — RESPIRATORY PANEL BY RT PCR (FLU A&B, COVID)
Influenza A by PCR: NEGATIVE
Influenza B by PCR: NEGATIVE
SARS Coronavirus 2 by RT PCR: NEGATIVE

## 2019-02-21 LAB — LIPASE, BLOOD: Lipase: 201 U/L — ABNORMAL HIGH (ref 11–51)

## 2019-02-21 LAB — TROPONIN I (HIGH SENSITIVITY)
Troponin I (High Sensitivity): 24 ng/L — ABNORMAL HIGH (ref <18)
Troponin I (High Sensitivity): 29 ng/L — ABNORMAL HIGH (ref <18)

## 2019-02-21 MED ORDER — ALTEPLASE 2 MG IJ SOLR: 2.0000 mg | Freq: Once | INTRAMUSCULAR | Status: DC | PRN

## 2019-02-21 MED ORDER — SORBITOL 70 % SOLN: 30.0000 mL | Status: DC | PRN

## 2019-02-21 MED ORDER — FERRIC CITRATE 1 GM 210 MG(FE) PO TABS
630.0000 mg | ORAL_TABLET | Freq: Three times a day (TID) | ORAL | Status: DC
  Administered 2019-02-21 – 2019-02-23 (×6): 630 mg via ORAL
  Filled 2019-02-21 (×8): qty 3

## 2019-02-21 MED ORDER — DICYCLOMINE HCL 10 MG PO CAPS
20.0000 mg | ORAL_CAPSULE | Freq: Every day | ORAL | Status: DC
  Administered 2019-02-21 – 2019-02-22 (×2): 20 mg via ORAL
  Filled 2019-02-21 (×2): qty 2

## 2019-02-21 MED ORDER — PENTAFLUOROPROP-TETRAFLUOROETH EX AERO
1.0000 "application " | INHALATION_SPRAY | CUTANEOUS | Status: DC | PRN
  Filled 2019-02-21: qty 30

## 2019-02-21 MED ORDER — DOXYLAMINE SUCCINATE (SLEEP) 25 MG PO TABS
25.0000 mg | ORAL_TABLET | Freq: Every evening | ORAL | Status: DC | PRN
  Filled 2019-02-21: qty 1

## 2019-02-21 MED ORDER — SODIUM CHLORIDE 0.9 % IV SOLN: 100.0000 mL | INTRAVENOUS | Status: DC | PRN

## 2019-02-21 MED ORDER — HEPARIN SODIUM (PORCINE) 5000 UNIT/ML IJ SOLN
5000.0000 [IU] | Freq: Three times a day (TID) | INTRAMUSCULAR | Status: DC
  Administered 2019-02-22 – 2019-02-23 (×4): 5000 [IU] via SUBCUTANEOUS
  Filled 2019-02-21 (×5): qty 1

## 2019-02-21 MED ORDER — IPRATROPIUM-ALBUTEROL 0.5-2.5 (3) MG/3ML IN SOLN
3.0000 mL | Freq: Four times a day (QID) | RESPIRATORY_TRACT | Status: DC
  Administered 2019-02-21: 3 mL via RESPIRATORY_TRACT
  Filled 2019-02-21: qty 3

## 2019-02-21 MED ORDER — LIDOCAINE-PRILOCAINE 2.5-2.5 % EX CREA
1.0000 "application " | TOPICAL_CREAM | CUTANEOUS | Status: DC | PRN
  Filled 2019-02-21: qty 5

## 2019-02-21 MED ORDER — HEPARIN SODIUM (PORCINE) 1000 UNIT/ML IJ SOLN
INTRAMUSCULAR | Status: AC
  Filled 2019-02-21: qty 4

## 2019-02-21 MED ORDER — HYPROMELLOSE (GONIOSCOPIC) 2.5 % OP SOLN: 1.0000 [drp] | Freq: Every day | OPHTHALMIC | Status: DC | PRN

## 2019-02-21 MED ORDER — HEPARIN SODIUM (PORCINE) 1000 UNIT/ML DIALYSIS: 1000.0000 [IU] | INTRAMUSCULAR | Status: DC | PRN

## 2019-02-21 MED ORDER — FUROSEMIDE 40 MG PO TABS
40.0000 mg | ORAL_TABLET | ORAL | Status: DC
  Administered 2019-02-22: 40 mg via ORAL
  Filled 2019-02-21: qty 1

## 2019-02-21 MED ORDER — LIDOCAINE-PRILOCAINE 2.5-2.5 % EX CREA: 1.0000 "application " | TOPICAL_CREAM | CUTANEOUS | Status: DC | PRN

## 2019-02-21 MED ORDER — GABAPENTIN 300 MG PO CAPS
600.0000 mg | ORAL_CAPSULE | Freq: Every evening | ORAL | Status: DC | PRN
  Administered 2019-02-22 (×2): 600 mg via ORAL
  Filled 2019-02-21 (×3): qty 2

## 2019-02-21 MED ORDER — DILTIAZEM HCL-DEXTROSE 125-5 MG/125ML-% IV SOLN (PREMIX)
5.0000 mg/h | INTRAVENOUS | Status: DC
  Administered 2019-02-21: 5 mg/h via INTRAVENOUS
  Filled 2019-02-21: qty 125

## 2019-02-21 MED ORDER — CHLORHEXIDINE GLUCONATE CLOTH 2 % EX PADS: 6.0000 | MEDICATED_PAD | Freq: Every day | CUTANEOUS | Status: DC

## 2019-02-21 MED ORDER — CAMPHOR-MENTHOL 0.5-0.5 % EX LOTN
1.0000 "application " | TOPICAL_LOTION | Freq: Three times a day (TID) | CUTANEOUS | Status: DC | PRN
  Filled 2019-02-21: qty 222

## 2019-02-21 MED ORDER — FERRIC CITRATE 1 GM 210 MG(FE) PO TABS
420.0000 mg | ORAL_TABLET | ORAL | Status: DC
  Administered 2019-02-22: 420 mg via ORAL
  Filled 2019-02-21 (×7): qty 2

## 2019-02-21 MED ORDER — ACETAMINOPHEN 325 MG PO TABS
650.0000 mg | ORAL_TABLET | ORAL | Status: DC | PRN
  Administered 2019-02-22: 650 mg via ORAL
  Filled 2019-02-21: qty 2

## 2019-02-21 MED ORDER — MOMETASONE FURO-FORMOTEROL FUM 200-5 MCG/ACT IN AERO
2.0000 | INHALATION_SPRAY | Freq: Two times a day (BID) | RESPIRATORY_TRACT | Status: DC
  Administered 2019-02-21 – 2019-02-22 (×3): 2 via RESPIRATORY_TRACT
  Filled 2019-02-21: qty 8.8

## 2019-02-21 MED ORDER — LIDOCAINE HCL (PF) 1 % IJ SOLN: 5.0000 mL | INTRAMUSCULAR | Status: DC | PRN

## 2019-02-21 MED ORDER — HEPARIN SODIUM (PORCINE) 1000 UNIT/ML DIALYSIS
1000.0000 [IU] | INTRAMUSCULAR | Status: DC | PRN
  Administered 2019-02-21: 1000 [IU] via INTRAVENOUS_CENTRAL
  Filled 2019-02-21 (×2): qty 1

## 2019-02-21 MED ORDER — METOPROLOL TARTRATE 50 MG PO TABS
100.0000 mg | ORAL_TABLET | Freq: Two times a day (BID) | ORAL | Status: DC
  Administered 2019-02-21 – 2019-02-23 (×4): 100 mg via ORAL
  Filled 2019-02-21 (×4): qty 2

## 2019-02-21 MED ORDER — ALLOPURINOL 100 MG PO TABS
100.0000 mg | ORAL_TABLET | Freq: Every day | ORAL | Status: DC
  Administered 2019-02-22 – 2019-02-23 (×2): 100 mg via ORAL
  Filled 2019-02-21 (×2): qty 1

## 2019-02-21 MED ORDER — FENTANYL CITRATE (PF) 100 MCG/2ML IJ SOLN
50.0000 ug | Freq: Once | INTRAMUSCULAR | Status: AC
  Administered 2019-02-21: 50 ug via INTRAVENOUS
  Filled 2019-02-21: qty 2

## 2019-02-21 MED ORDER — CALCIUM CARBONATE ANTACID 1250 MG/5ML PO SUSP
500.0000 mg | Freq: Four times a day (QID) | ORAL | Status: DC | PRN
  Filled 2019-02-21: qty 5

## 2019-02-21 MED ORDER — PRAVASTATIN SODIUM 40 MG PO TABS
80.0000 mg | ORAL_TABLET | Freq: Every evening | ORAL | Status: DC
  Administered 2019-02-21 – 2019-02-22 (×2): 80 mg via ORAL
  Filled 2019-02-21 (×2): qty 2

## 2019-02-21 MED ORDER — ASPIRIN EC 81 MG PO TBEC
81.0000 mg | DELAYED_RELEASE_TABLET | Freq: Every day | ORAL | Status: DC
  Administered 2019-02-22 – 2019-02-23 (×2): 81 mg via ORAL
  Filled 2019-02-21 (×2): qty 1

## 2019-02-21 MED ORDER — DOCUSATE SODIUM 283 MG RE ENEM: 1.0000 | ENEMA | RECTAL | Status: DC | PRN

## 2019-02-21 MED ORDER — ISOSORBIDE MONONITRATE ER 30 MG PO TB24
30.0000 mg | ORAL_TABLET | Freq: Every day | ORAL | Status: DC
  Administered 2019-02-22 – 2019-02-23 (×2): 30 mg via ORAL
  Filled 2019-02-21 (×2): qty 1

## 2019-02-21 MED ORDER — CHLORHEXIDINE GLUCONATE CLOTH 2 % EX PADS
6.0000 | MEDICATED_PAD | Freq: Every day | CUTANEOUS | Status: DC
  Administered 2019-02-22: 6 via TOPICAL

## 2019-02-21 MED ORDER — PENTAFLUOROPROP-TETRAFLUOROETH EX AERO: 1.0000 "application " | INHALATION_SPRAY | CUTANEOUS | Status: DC | PRN

## 2019-02-21 MED ORDER — POLYETHYLENE GLYCOL 3350 17 G PO PACK
17.0000 g | PACK | ORAL | Status: DC
  Administered 2019-02-21 – 2019-02-23 (×2): 17 g via ORAL
  Filled 2019-02-21: qty 1

## 2019-02-21 MED ORDER — NEPRO/CARBSTEADY PO LIQD: 237.0000 mL | Freq: Three times a day (TID) | ORAL | Status: DC | PRN

## 2019-02-21 MED ORDER — ONDANSETRON HCL 4 MG/2ML IJ SOLN: 4.0000 mg | Freq: Four times a day (QID) | INTRAMUSCULAR | Status: DC | PRN

## 2019-02-21 MED ORDER — SENNOSIDES-DOCUSATE SODIUM 8.6-50 MG PO TABS
1.0000 | ORAL_TABLET | Freq: Two times a day (BID) | ORAL | Status: DC
  Administered 2019-02-21 – 2019-02-23 (×3): 1 via ORAL
  Filled 2019-02-21 (×3): qty 1

## 2019-02-21 MED ORDER — DILTIAZEM HCL-DEXTROSE 125-5 MG/125ML-% IV SOLN (PREMIX)
5.0000 mg/h | INTRAVENOUS | Status: DC
  Administered 2019-02-22: 5 mg/h via INTRAVENOUS
  Filled 2019-02-21: qty 125

## 2019-02-21 MED ORDER — FERRIC CITRATE 1 GM 210 MG(FE) PO TABS: 420.0000 mg | ORAL_TABLET | ORAL | Status: DC

## 2019-02-21 MED ORDER — ZOLPIDEM TARTRATE 5 MG PO TABS
5.0000 mg | ORAL_TABLET | Freq: Every evening | ORAL | Status: DC | PRN
  Administered 2019-02-22: 5 mg via ORAL
  Filled 2019-02-21 (×2): qty 1

## 2019-02-21 MED ORDER — ALBUTEROL SULFATE (2.5 MG/3ML) 0.083% IN NEBU: 2.5000 mg | INHALATION_SOLUTION | Freq: Four times a day (QID) | RESPIRATORY_TRACT | Status: DC | PRN

## 2019-02-21 MED ORDER — HYDROXYZINE HCL 25 MG PO TABS
25.0000 mg | ORAL_TABLET | Freq: Three times a day (TID) | ORAL | Status: DC | PRN
  Administered 2019-02-22 (×2): 25 mg via ORAL
  Filled 2019-02-21 (×2): qty 1

## 2019-02-21 NOTE — ED Triage Notes (Signed)
Pt reports ABD pain that started this AM . Pt also is a Dialysis PT with a HX of A-fib. Pt reports she goes to dialysis T-T-S. Pt missed dialysis T,S and had part  treatment on Thursday. PT HR 146 with HX of A-fib.

## 2019-02-21 NOTE — Consult Note (Addendum)
[facility] Renal Consultation Note    Indication for Consultation:  Management of ESRD/hemodialysis; anemia, hypertension/volume and secondary hyperparathyroidism  YQM:VHQIO, [name], PA-C  HPI: Christina [name] is a 69 y.o. female. ESRD on HD TTS at [facility].  Past medical history significant for HTN, COPD, CAD s/p stents, A fib, GERD, Hx SBO and substance abuse who was admitted with abdominal pain and A fib with RVR.   Patient seen and examined at bedside. Reports nausea and vomiting and abdominal pain starting this morning.  Symptoms have now resolved after receiving pain medication.  Missed 2 dialysis treatments last week because [facility] did not pick her up, per patient.  She completed 1/2 a treatment on Thursday.  Admits to shortness of breath.  Denies CP, diarrhea, weakness, dizziness and fatigue.   Pertinent findings in the ED include hypertension, tachycardia, +Afib w/RVR, K 5.2, lipase 201, COVID 19 negative, CT abdomen with no acute process, CXR with no acute findings.   Of note, patient has intermittent non compliance with prescribed dialysis regimen, missing 2 of her last 3 dialysis sessions.  Reports she feels weak and drained after dialysis and it does not improve until the following day.  She did meet her dry weight at her last dialysis on 02/17/19.  She was recently admitted from 1/8-1/11/21 due to respiratory failure requiting intubation secondary to volume overload.    Past Medical History:  Diagnosis Date  . Anemia   . Anxiety   . Arthritis   . Asthma   . Atrial fibrillation ([place])   . Chronic kidney disease    Dialysis T/Th/Sa  started in March 2020  . COPD (chronic obstructive pulmonary disease) ([place])   . Coronary artery disease    2 stents  . Depression   . GERD (gastroesophageal reflux disease)   . Gout   . Headache    migraines  . History of kidney stones   . Hyperlipidemia   . Hypertension   . Pneumonia   . Small bowel obstruction [facility])     Past Surgical History:  Procedure Laterality Date  . ABDOMINAL HYSTERECTOMY    . ABDOMINAL SURGERY     for small bowel obstruction - x 2  . APPENDECTOMY    . AV FISTULA PLACEMENT Left 08/04/2018   Procedure: ARTERIOVENOUS (AV) FISTULA CREATION LEFT ARM;  Surgeon: Waynetta Sandy, MD;  Location: [facility];  Service: Vascular;  Laterality: Left;  . BASCILIC VEIN TRANSPOSITION Left 11/24/2018   Procedure: SECOND STAGE BASILIC VEIN TRANSPOSITION LEFT ARM;  Surgeon: Waynetta Sandy, MD;  Location: [facility];  Service: Vascular;  Laterality: Left;  . CARDIAC CATHETERIZATION    . CORONARY ANGIOPLASTY  ?2003/2004  . FACIAL RECONSTRUCTION SURGERY     x 2  . HERNIA REPAIR     History reviewed. No pertinent family history. Social History:  reports that she quit smoking about 2 years ago. She has a 25.00 pack-year smoking history. She has never used smokeless tobacco. She reports previous alcohol use. She reports previous drug use. Drug: Marijuana. Allergies  Allergen Reactions  . Cyclobenzaprine Anaphylaxis and Other (See Comments)    "stopped heart"   . Morphine Anaphylaxis    "stopped heart"  . Penicillins Shortness Of Breath, Swelling and Palpitations    Did it involve swelling of the face/tongue/throat, SOB, or low BP? Yes Did it involve sudden or severe rash/hives, skin peeling, or any reaction on the inside of your mouth or nose? No Did you need  to seek medical attention at a hospital or doctor's office? No When did it last happen?years  If all above answers are "NO", may proceed with cephalosporin use.   . Codeine Itching and Rash  . Hydromorphone Other (See Comments)    If administered quickly, felt like hand was "exploding"    Prior to Admission medications   Medication Sig Start Date End Date Taking? Authorizing Provider  albuterol (PROVENTIL) (2.5 MG/3ML) 0.083% nebulizer solution Take 2.5 mg by nebulization every 6 (six) hours as needed for wheezing or  shortness of breath.    [provider]  allopurinol (ZYLOPRIM) 100 MG tablet Take 100 mg by mouth daily. 02/18/18   [provider]  amLODipine (NORVASC) 10 MG tablet Take 10 mg by mouth daily.    [provider]  aspirin EC 81 MG tablet Take 81 mg by mouth daily.    [provider]  AURYXIA 1 GM 210 MG(Fe) tablet Take 420 mg by mouth See admin instructions. Take 3 tablets (630 mg) by mouth with each meal & take 2 tablets (420 mg) by mouth with each snack 08/16/18   [provider]  Carboxymethylcellul-Glycerin (LUBRICATING EYE DROPS OP) Place 1 drop into both eyes daily as needed (dry eyes).    [provider]  Cholecalciferol (VITAMIN D3) 50 MCG (2000 UT) capsule Take 2,000 Units by mouth daily.     [provider]  dicyclomine (BENTYL) 10 MG capsule Take 20 mg by mouth at bedtime.  08/27/17   [provider]  docusate sodium (COLACE) 100 MG capsule Take 1 capsule (100 mg total) by mouth 2 (two) times daily. 08/11/18   Hongalgi, Lenis Dickinson, MD  doxylamine, Sleep, (UNISOM) 25 MG tablet Take 25 mg by mouth at bedtime as needed for sleep.    [provider]  furosemide (LASIX) 80 MG tablet Take 80 mg by mouth See admin instructions. Take one tablet (40 mg totally) by mouth on TUE THUR SAT before dialysis. 10/21/18   [provider]  gabapentin (NEURONTIN) 300 MG capsule Take 600 mg by mouth at bedtime as needed (leg pain.).  08/17/18   [provider]  Ipratropium-Albuterol (COMBIVENT) 20-100 MCG/ACT AERS respimat Inhale 2 puffs into the lungs 4 (four) times daily as needed for wheezing. 09/11/17   [provider]  isosorbide mononitrate (IMDUR) 30 MG 24 hr tablet Take 30 mg by mouth daily. 08/17/18   [provider]  lidocaine-prilocaine (EMLA) cream Apply 1 application topically daily as needed (1-2 hours before dialysis (cover with occlusive dressing)).  09/03/18   [provider]   metoprolol tartrate (LOPRESSOR) 100 MG tablet Take 1 tablet (100 mg total) by mouth 2 (two) times daily. 01/26/19   Little Ishikawa, MD  nitroGLYCERIN (NITROSTAT) 0.4 MG SL tablet Place 0.4 mg under the tongue every 5 (five) minutes as needed for chest pain.  09/18/16   [provider]  ondansetron (ZOFRAN) 4 MG tablet Take 4 mg by mouth 3 (three) times daily as needed for nausea. 09/14/18   [provider]  pantoprazole (PROTONIX) 40 MG tablet Take 1 tablet (40 mg total) by mouth daily. Patient taking differently: Take 40 mg by mouth daily as needed (acid reflux/indigestion.).  08/11/18   Hongalgi, Lenis Dickinson, MD  polyethylene glycol (MIRALAX / GLYCOLAX) 17 g packet Take 17 g by mouth 2 (two) times daily. Patient taking differently: Take 17 g by mouth every other day.  08/11/18   Hongalgi, Lenis Dickinson, MD  pravastatin (PRAVACHOL) 80 MG tablet Take 80 mg by mouth every evening.  06/30/16   [provider]  sennosides-docusate sodium (SENOKOT-S) 8.6-50 MG tablet Take 1 tablet by mouth 2 (two) times daily.     [provider]  SYMBICORT 160-4.5 MCG/ACT inhaler Inhale 2 puffs into the lungs every 4 (four) hours as needed (for wheezing).  09/16/18   [provider]   Current Facility-Administered Medications  Medication Dose Route Frequency Provider Last Rate Last Admin  . diltiazem (CARDIZEM) 125 mg in dextrose 5% 125 mL (1 mg/mL) infusion  5-15 mg/hr Intravenous Continuous Tegeler, Gwenyth Allegra, MD 5 mL/hr at 02/21/19 1315 5 mg/hr at 02/21/19 1315   Current Outpatient Medications  Medication Sig Dispense Refill  . albuterol (PROVENTIL) (2.5 MG/3ML) 0.083% nebulizer solution Take 2.5 mg by nebulization every 6 (six) hours as needed for wheezing or shortness of breath.    . allopurinol (ZYLOPRIM) 100 MG tablet Take 100 mg by mouth daily.    Marland Kitchen amLODipine (NORVASC) 10 MG tablet Take 10 mg by mouth daily.    Marland Kitchen aspirin EC 81 MG tablet Take 81 mg by mouth daily.     Lorin Picket 1 GM 210 MG(Fe) tablet Take 420 mg by mouth See admin instructions. Take 3 tablets (630 mg) by mouth with each meal & take 2 tablets (420 mg) by mouth with each snack    . Carboxymethylcellul-Glycerin (LUBRICATING EYE DROPS OP) Place 1 drop into both eyes daily as needed (dry eyes).    . Cholecalciferol (VITAMIN D3) 50 MCG (2000 UT) capsule Take 2,000 Units by mouth daily.     Marland Kitchen dicyclomine (BENTYL) 10 MG capsule Take 20 mg by mouth at bedtime.     . docusate sodium (COLACE) 100 MG capsule Take 1 capsule (100 mg total) by mouth 2 (two) times daily. 30 capsule 0  . doxylamine, Sleep, (UNISOM) 25 MG tablet Take 25 mg by mouth at bedtime as needed for sleep.    . furosemide (LASIX) 80 MG tablet Take 80 mg by mouth See admin instructions. Take one tablet (40 mg totally) by mouth on TUE THUR SAT before dialysis.    Marland Kitchen gabapentin (NEURONTIN) 300 MG capsule Take 600 mg by mouth at bedtime as needed (leg pain.).     Marland Kitchen Ipratropium-Albuterol (COMBIVENT) 20-100 MCG/ACT AERS respimat Inhale 2 puffs into the lungs 4 (four) times daily as needed for wheezing.    . isosorbide mononitrate (IMDUR) 30 MG 24 hr tablet Take 30 mg by mouth daily.    Marland Kitchen lidocaine-prilocaine (EMLA) cream Apply 1 application topically daily as needed (1-2 hours before dialysis (cover with occlusive dressing)).     . metoprolol tartrate (LOPRESSOR) 100 MG tablet Take 1 tablet (100 mg total) by mouth 2 (two) times daily. 60 tablet 0  . nitroGLYCERIN (NITROSTAT) 0.4 MG SL tablet Place 0.4 mg under the tongue every 5 (five) minutes as needed for chest pain.     Marland Kitchen ondansetron (ZOFRAN) 4 MG tablet Take 4 mg by mouth 3 (three) times daily as needed for nausea.    . pantoprazole (PROTONIX) 40 MG tablet Take 1 tablet (40 mg total) by mouth daily. (Patient taking differently: Take 40 mg by mouth daily as needed (acid reflux/indigestion.). )    . polyethylene glycol (MIRALAX / GLYCOLAX) 17 g packet Take 17 g by mouth 2 (two) times daily.  (Patient taking differently: Take 17 g by mouth every other day. )    . pravastatin (PRAVACHOL) 80 MG tablet  Take 80 mg by mouth every evening.     . sennosides-docusate sodium (SENOKOT-S) 8.6-50 MG tablet Take 1 tablet by mouth 2 (two) times daily.     . SYMBICORT 160-4.5 MCG/ACT inhaler Inhale 2 puffs into the lungs every 4 (four) hours as needed (for wheezing).      Labs: Basic Metabolic Panel: Recent Labs  Lab 02/21/19 0834  NA 137  K 5.2*  CL 100  CO2 21*  GLUCOSE 124*  BUN 59*  CREATININE 6.57*  CALCIUM 9.2   Liver Function Tests: Recent Labs  Lab 02/21/19 0834  AST 18  ALT 12  ALKPHOS 67  BILITOT 1.0  PROT 7.3  ALBUMIN 3.3*   Recent Labs  Lab 02/21/19 0834  LIPASE 201*   CBC: Recent Labs  Lab 02/21/19 0834  WBC 10.8*  NEUTROABS 8.1*  HGB 12.6  HCT 39.1  MCV 95.6  PLT 287   Studies/Results: CT ABDOMEN PELVIS WO CONTRAST  Result Date: 02/21/2019 CLINICAL DATA:  Abdominal pain with nausea and vomiting. History of multiple small bowel obstructions. EXAM: CT ABDOMEN AND PELVIS WITHOUT CONTRAST TECHNIQUE: Multidetector CT imaging of the abdomen and pelvis was performed following the standard protocol without IV contrast. COMPARISON:  CT abdomen pelvis dated September 15, 2018. FINDINGS: Lower chest: No acute abnormality. Unchanged mild scarring at the lung bases. Hepatobiliary: No focal liver abnormality is seen. No gallstones, gallbladder wall thickening, or biliary dilatation. Pancreas: Unremarkable. No pancreatic ductal dilatation or surrounding inflammatory changes. Spleen: Normal in size without focal abnormality. Adrenals/Urinary Tract: Unchanged bilateral adrenal adenomas measuring 2.5 cm on the left and 2.2 cm on the right. Unchanged bilateral renal atrophy, small cysts, and right greater than left renovascular calcifications. No definite renal calculi. No hydronephrosis. The bladder is unremarkable. Stomach/Bowel: Stomach is within normal limits. Prior  appendectomy. Unchanged small bowel anastomosis in the left abdomen. No evidence of bowel wall thickening, distention, or inflammatory changes. Oral contrast reaches the sigmoid colon. Unchanged duodenal diverticulum. Vascular/Lymphatic: Aortic atherosclerosis. No enlarged abdominal or pelvic lymph nodes. Reproductive: Status post hysterectomy. No adnexal masses. Other: No free fluid or pneumoperitoneum. Unchanged small fat containing ventral supraumbilical hernias. Musculoskeletal: No acute or significant osseous findings. IMPRESSION: 1. No acute intra-abdominal process. 2. Unchanged bilateral adrenal adenomas. 3.  Aortic atherosclerosis (ICD10-I70.0). Electronically Signed   By: Titus Dubin M.D.   On: 02/21/2019 11:40   DG Chest Portable 1 View  Result Date: 02/21/2019 CLINICAL DATA:  Tachycardia EXAM: PORTABLE CHEST 1 VIEW COMPARISON:  February 05, 2019 FINDINGS: Endotracheal tube and nasogastric tube have been removed. Central catheter tip is in the superior vena cava near the cavoatrial junction, unchanged. There is mild atelectatic change in the left mid lung and left base. Lungs elsewhere are clear. Heart is upper normal in size with pulmonary vascularity normal. No adenopathy. There is aortic atherosclerosis. No bone lesions. IMPRESSION: Areas of mild atelectatic change on the left. Lungs elsewhere clear. Stable cardiac silhouette. No adenopathy. Central catheter tip in superior vena cava near cavoatrial junction, stable. Aortic Atherosclerosis (ICD10-I70.0). Electronically Signed   By: Lowella Grip III M.D.   On: 02/21/2019 08:10    ROS: All others negative except those listed in HPI.  Physical Exam: Vitals:   02/21/19 1415 02/21/19 1430 02/21/19 1431 02/21/19 1445  BP: 103/69 125/88  (!) 150/73  Pulse: 86 78  84  Resp:    14  Temp:      TempSrc:      SpO2: 96% 98% 95% 100%  Height:         General: WD, thin female in NAD Head: NCAT sclera not icteric  Neck: Supple. No  lymphadenopathy Lungs: mostly CTA bilaterally. +crackles on RLL, No wheeze or rhonchi. Breathing is unlabored. Heart: RRR. No murmur, rubs or gallops.  Abdomen: soft, nontender, +BS, no guarding, no rebound tenderness Lower extremities:no edema, ischemic changes, or open wounds  Neuro: AAOx3. Moves all extremities spontaneously. Psych:  Responds to questions appropriately with a normal affect. Dialysis Access: LU AVF+b, R IJ TDC  Dialysis Orders:  TTS - [facility]  4hrs, BFR 250, DFR AF1.5,  EDW 47kg, 2K/ 2Ca  Access: LU AVF, TDC  Heparin none Mircera 75 mcg q2wks - last 02/17/19 Venofer 50mg  qHD Calcitriol 0.75 mcg PO qHD   Assessment/Plan: 1.  A fib w/RVR - improved on diltiazem. Per primary 2. Abdominal pain/n/v - improved w/pain meds, ?pancreatitis, lipase 201. CT negative for SBO.  Per primary. 3.  ESRD -  On HD TTS.  Missed 2 of 3 HD last week.  Plan for short treatment tonight and again tomorrow to get back on regular schedule. K 5.2.  4.  Hypertension/volume  - BP elevated.  At home on amlodipine 10mg , metoprolol tartrate 100mg  BID and lasix 80mg  qd on non HD days.  Does not appear volume overloaded, plan for UF to dry.  5.  Anemia of CKD - Hgb 12.6, no indication for ESA at this time 6.  Secondary Hyperparathyroidism -  Ca at goal. Will check phos. Continue binders and VDRA.  7.  Nutrition - Renal diet w/fluid restrictions.  8. COPD 9. CAD s/p stents  Jen Mow, PA-C [facility] Pager: [phone] 02/21/2019, 2:55 PM   Pt seen, examined and agree w A/P as above.  Kelly Splinter  MD 02/21/2019, 5:09 PM

## 2019-02-21 NOTE — H&P (Addendum)
History and Physical    MATTIA OSTERMAN JSR:[id number] DOB: [birth year] DOA: 02/21/2019  PCP: Maggie Schwalbe, PA-C Consultants:  Donzetta Matters - vascular; Otho Perl - cardiology; Audie Clear - nephrology Patient coming from:  Home - lives alone; NOK: Daughter, [name], [phone]  Chief Complaint: Abdominal pain, HD non-compliance  HPI: DELL HURTUBISE is a 69 y.o. female with medical history significant of SBO; HTN; HLD; depression; CAD with stents; COPD; ESRD on TTS HD; and afib presenting with abdominal pain after several missed HD treatments.  This morning, she woke up about 0400.  Her stomach was killing her and she thought she had another SBO - the pain was just the same.  Then pain was below her umbilicus in the midline.  +N/V, no diarrhea.  She did feel like her heart as racing for at least an hour before called 911.  She was afraid her heart was going to stop since she has h/o multiple MIs/stents.  She was feeling SOB since she woke up this morning.  She rides with the [facility] for HD and they didn't pick her up for a week - she wasn't showing up so they canceled her.  She did get 1/2 treatment on Thursday - she didn't go Tuesday or Saturday of this past week.   She did get "shakes" like she usually does when she misses HD.  Her abdominal pain is gone now, maybe due to the pain medication she was given.  She was admitted to the PCCM service from 1/8-11 for volume overload requiring intubation.  She quickly improved once she was dialyzed.  ED Course:  Patient intubated a couple of weeks ago and better but today with abdominal pain - not SBO on CT.  Lipase 200, ?mild pancreatitis.  Afib with RVR, better on Dilt drip.  ESRD on HD, missed sessions due to transportation issues.  COVID negative.    Review of Systems: As per HPI; otherwise review of systems reviewed and negative.   Ambulatory Status:  Ambulates without assistance usually  Past Medical History:  Diagnosis Date  . Anemia   .  Anxiety   . Arthritis   . Asthma   . Atrial fibrillation ([place])   . Chronic kidney disease    Dialysis T/Th/Sa  started in March 2020  . COPD (chronic obstructive pulmonary disease) ([place])   . Coronary artery disease    2 stents  . Depression   . GERD (gastroesophageal reflux disease)   . Gout   . Headache    migraines  . History of kidney stones   . Hyperlipidemia   . Hypertension   . Pneumonia   . Small bowel obstruction [facility])     Past Surgical History:  Procedure Laterality Date  . ABDOMINAL HYSTERECTOMY    . ABDOMINAL SURGERY     for small bowel obstruction - x 2  . APPENDECTOMY    . AV FISTULA PLACEMENT Left 08/04/2018   Procedure: ARTERIOVENOUS (AV) FISTULA CREATION LEFT ARM;  Surgeon: Waynetta Sandy, MD;  Location: [place];  Service: Vascular;  Laterality: Left;  . BASCILIC VEIN TRANSPOSITION Left 11/24/2018   Procedure: SECOND STAGE BASILIC VEIN TRANSPOSITION LEFT ARM;  Surgeon: Waynetta Sandy, MD;  Location: [place];  Service: Vascular;  Laterality: Left;  . CARDIAC CATHETERIZATION    . CORONARY ANGIOPLASTY  ?2003/2004  . FACIAL RECONSTRUCTION SURGERY     x 2  . HERNIA REPAIR      Social History   Socioeconomic History  .  Marital status: Single    Spouse name: Not on file  . Number of children: Not on file  . Years of education: Not on file  . Highest education level: Not on file  Occupational History  . Occupation: retired  Tobacco Use  . Smoking status: Former Smoker    Packs/day: 0.50    Years: 50.00    Pack years: 25.00    Quit date: 2019    Years since quitting: 2.0  . Smokeless tobacco: Never Used  Substance and Sexual Activity  . Alcohol use: Not Currently    Comment: quit 2019 - "I was a drunk"  . Drug use: Not Currently    Types: Marijuana    Comment: h/o drug use - "I was raised in the 60s"; last use 18 months ago; smokes marijuana periodically  . Sexual activity: Not on file  Other Topics Concern  . Not on file  Social  History Narrative  . Not on file   Social Determinants of Health   Financial Resource Strain:   . Difficulty of Paying Living Expenses: Not on file  Food Insecurity:   . Worried About Charity fundraiser in the Last Year: Not on file  . Ran Out of Food in the Last Year: Not on file  Transportation Needs:   . Lack of Transportation (Medical): Not on file  . Lack of Transportation (Non-Medical): Not on file  Physical Activity:   . Days of Exercise per Week: Not on file  . Minutes of Exercise per Session: Not on file  Stress:   . Feeling of Stress : Not on file  Social Connections:   . Frequency of Communication with Friends and Family: Not on file  . Frequency of Social Gatherings with Friends and Family: Not on file  . Attends Religious Services: Not on file  . Active Member of Clubs or Organizations: Not on file  . Attends Archivist Meetings: Not on file  . Marital Status: Not on file  Intimate Partner Violence:   . Fear of Current or Ex-Partner: Not on file  . Emotionally Abused: Not on file  . Physically Abused: Not on file  . Sexually Abused: Not on file    Allergies  Allergen Reactions  . Cyclobenzaprine Anaphylaxis and Other (See Comments)    "stopped heart"   . Morphine Anaphylaxis    "stopped heart"  . Penicillins Shortness Of Breath, Swelling and Palpitations    Did it involve swelling of the face/tongue/throat, SOB, or low BP? Yes Did it involve sudden or severe rash/hives, skin peeling, or any reaction on the inside of your mouth or nose? No Did you need to seek medical attention at a hospital or doctor's office? No When did it last happen?years  If all above answers are "NO", may proceed with cephalosporin use.   . Codeine Itching and Rash  . Hydromorphone Other (See Comments)    If administered quickly, felt like hand was "exploding"     History reviewed. No pertinent family history.  Prior to Admission medications   Medication Sig  Start Date End Date Taking? Authorizing Provider  albuterol (PROVENTIL) (2.5 MG/3ML) 0.083% nebulizer solution Take 2.5 mg by nebulization every 6 (six) hours as needed for wheezing or shortness of breath.    [provider]  allopurinol (ZYLOPRIM) 100 MG tablet Take 100 mg by mouth daily. 02/18/18   [provider]  amLODipine (NORVASC) 10 MG tablet Take 10 mg by mouth daily.  [provider]  aspirin EC 81 MG tablet Take 81 mg by mouth daily.    [provider]  AURYXIA 1 GM 210 MG(Fe) tablet Take 420 mg by mouth See admin instructions. Take 3 tablets (630 mg) by mouth with each meal & take 2 tablets (420 mg) by mouth with each snack 08/16/18   [provider]  Carboxymethylcellul-Glycerin (LUBRICATING EYE DROPS OP) Place 1 drop into both eyes daily as needed (dry eyes).    [provider]  Cholecalciferol (VITAMIN D3) 50 MCG (2000 UT) capsule Take 2,000 Units by mouth daily.     [provider]  dicyclomine (BENTYL) 10 MG capsule Take 20 mg by mouth at bedtime.  08/27/17   [provider]  docusate sodium (COLACE) 100 MG capsule Take 1 capsule (100 mg total) by mouth 2 (two) times daily. 08/11/18   Hongalgi, Lenis Dickinson, MD  doxylamine, Sleep, (UNISOM) 25 MG tablet Take 25 mg by mouth at bedtime as needed for sleep.    [provider]  furosemide (LASIX) 80 MG tablet Take 80 mg by mouth See admin instructions. Take one tablet (40 mg totally) by mouth on TUE THUR SAT before dialysis. 10/21/18   [provider]  gabapentin (NEURONTIN) 300 MG capsule Take 600 mg by mouth at bedtime as needed (leg pain.).  08/17/18   [provider]  Ipratropium-Albuterol (COMBIVENT) 20-100 MCG/ACT AERS respimat Inhale 2 puffs into the lungs 4 (four) times daily as needed for wheezing. 09/11/17   [provider]  isosorbide mononitrate (IMDUR) 30 MG 24 hr tablet Take 30 mg by mouth daily. 08/17/18   [provider]    lidocaine-prilocaine (EMLA) cream Apply 1 application topically daily as needed (1-2 hours before dialysis (cover with occlusive dressing)).  09/03/18   [provider]  metoprolol tartrate (LOPRESSOR) 100 MG tablet Take 1 tablet (100 mg total) by mouth 2 (two) times daily. 01/26/19   Little Ishikawa, MD  nitroGLYCERIN (NITROSTAT) 0.4 MG SL tablet Place 0.4 mg under the tongue every 5 (five) minutes as needed for chest pain.  09/18/16   [provider]  ondansetron (ZOFRAN) 4 MG tablet Take 4 mg by mouth 3 (three) times daily as needed for nausea. 09/14/18   [provider]  pantoprazole (PROTONIX) 40 MG tablet Take 1 tablet (40 mg total) by mouth daily. Patient taking differently: Take 40 mg by mouth daily as needed (acid reflux/indigestion.).  08/11/18   Hongalgi, Lenis Dickinson, MD  polyethylene glycol (MIRALAX / GLYCOLAX) 17 g packet Take 17 g by mouth 2 (two) times daily. Patient taking differently: Take 17 g by mouth every other day.  08/11/18   Hongalgi, Lenis Dickinson, MD  pravastatin (PRAVACHOL) 80 MG tablet Take 80 mg by mouth every evening.  06/30/16   [provider]  sennosides-docusate sodium (SENOKOT-S) 8.6-50 MG tablet Take 1 tablet by mouth 2 (two) times daily.     [provider]  SYMBICORT 160-4.5 MCG/ACT inhaler Inhale 2 puffs into the lungs every 4 (four) hours as needed (for wheezing).  09/16/18   [provider]    Physical Exam: Vitals:   02/21/19 1500 02/21/19 1515 02/21/19 1530 02/21/19 1625  BP: (!) 149/137 (!) 156/107 (!) 160/110 (!) 158/84  Pulse: 73  79 85  Resp: 19 (!) 22 18 19   Temp:    97.9 F (36.6 C)  TempSrc:    Oral  SpO2: 100%  99% 100%  Weight:    47.7  kg  Height:    5\' 2"  (1.575 m)     . General:  Appears calm and comfortable and is NAD; appears older than stated age . Eyes:  PERRL, EOMI, normal lids, iris . ENT:  grossly normal hearing, lips & tongue, mmm . Neck:  no LAD, masses or  thyromegaly . Cardiovascular:  RRR, no m/r/g. No LE edema.  Marland Kitchen Respiratory:   CTA bilaterally with no wheezes/rales/rhonchi.  Normal to mildly increased respiratory effort. . Abdomen:  soft, NT, ND, NABS . Skin:  no rash or induration seen on limited exam . Musculoskeletal:  grossly normal tone BUE/BLE, good ROM, no bony abnormality . Psychiatric:  blunted mood and affect, speech fluent and appropriate, AOx3 . Neurologic:  CN 2-12 grossly intact, moves all extremities in coordinated fashion    Radiological Exams on Admission: CT ABDOMEN PELVIS WO CONTRAST  Result Date: 02/21/2019 CLINICAL DATA:  Abdominal pain with nausea and vomiting. History of multiple small bowel obstructions. EXAM: CT ABDOMEN AND PELVIS WITHOUT CONTRAST TECHNIQUE: Multidetector CT imaging of the abdomen and pelvis was performed following the standard protocol without IV contrast. COMPARISON:  CT abdomen pelvis dated September 15, 2018. FINDINGS: Lower chest: No acute abnormality. Unchanged mild scarring at the lung bases. Hepatobiliary: No focal liver abnormality is seen. No gallstones, gallbladder wall thickening, or biliary dilatation. Pancreas: Unremarkable. No pancreatic ductal dilatation or surrounding inflammatory changes. Spleen: Normal in size without focal abnormality. Adrenals/Urinary Tract: Unchanged bilateral adrenal adenomas measuring 2.5 cm on the left and 2.2 cm on the right. Unchanged bilateral renal atrophy, small cysts, and right greater than left renovascular calcifications. No definite renal calculi. No hydronephrosis. The bladder is unremarkable. Stomach/Bowel: Stomach is within normal limits. Prior appendectomy. Unchanged small bowel anastomosis in the left abdomen. No evidence of bowel wall thickening, distention, or inflammatory changes. Oral contrast reaches the sigmoid colon. Unchanged duodenal diverticulum. Vascular/Lymphatic: Aortic atherosclerosis. No enlarged abdominal or pelvic lymph nodes. Reproductive:  Status post hysterectomy. No adnexal masses. Other: No free fluid or pneumoperitoneum. Unchanged small fat containing ventral supraumbilical hernias. Musculoskeletal: No acute or significant osseous findings. IMPRESSION: 1. No acute intra-abdominal process. 2. Unchanged bilateral adrenal adenomas. 3.  Aortic atherosclerosis (ICD10-I70.0). Electronically Signed   By: Titus Dubin M.D.   On: 02/21/2019 11:40   DG Chest Portable 1 View  Result Date: 02/21/2019 CLINICAL DATA:  Tachycardia EXAM: PORTABLE CHEST 1 VIEW COMPARISON:  February 05, 2019 FINDINGS: Endotracheal tube and nasogastric tube have been removed. Central catheter tip is in the superior vena cava near the cavoatrial junction, unchanged. There is mild atelectatic change in the left mid lung and left base. Lungs elsewhere are clear. Heart is upper normal in size with pulmonary vascularity normal. No adenopathy. There is aortic atherosclerosis. No bone lesions. IMPRESSION: Areas of mild atelectatic change on the left. Lungs elsewhere clear. Stable cardiac silhouette. No adenopathy. Central catheter tip in superior vena cava near cavoatrial junction, stable. Aortic Atherosclerosis (ICD10-I70.0). Electronically Signed   By: Lowella Grip III M.D.   On: 02/21/2019 08:10    EKG: Independently reviewed.  Afib with rate 148; prolonged QTc 526; nonspecific ST changes with no evidence of acute ischemia   Labs on Admission: I have personally reviewed the available labs and imaging studies at the time of the admission.  Pertinent labs:   K+ 5.2 CO2 21 Glucose 124  BUN 59/Creatinine 6.57/GFR 7 Anion gap 16 Albumin 3.3 Lipase 201 HS troponin 24, 29 Lactate 1.0, 1.1 WBC 10.8 Respiratory panel PCR  negative   Assessment/Plan Principal Problem:   Atrial fibrillation with RVR (HCC) Active Problems:   ESRD (end stage renal disease) (HCC)   COPD with chronic bronchitis (HCC)   Essential hypertension   Hyperlipidemia   Abdominal pain    Non-compliance with renal dialysis ([place])   Afib with RVR -Patient presenting with recurrent afib, now with RVR -Etiology is thought to be related to HD non-compliance  -Will plan to place in observation status in SDU for Diltiazem drip as per protocol with plan to transition to PO Diltiazem once heart rate is controlled.  -CHA2DS2-VASc Score is >2 and so patient would need oral anticoagulation.  -However, she has a pattern of medical non-compliance and appears to be at high risk from [facility] so will hold for now.   -Additionally, nephrology has suggested no heparin with HD. 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   DVT prophylaxis: Heparin Code Status:  DNR - confirmed with patient Family Communication: None present; I spoke with her daughter by telephone at the time of admission Disposition Plan:  Home once  clinically improved Consults called: Nephrology Admission status: It is my clinical opinion that referral for OBSERVATION is reasonable and necessary in this patient based on the above information provided. The aforementioned taken together are felt to place the patient at high risk for further clinical deterioration. However it is anticipated that the patient may be medically stable for discharge from the hospital within 24 to 48 hours.      Karmen Bongo MD [facility]   How to contact the [facility] Attending or Consulting provider [place] or covering provider during after hours [place], for this patient?  1. Check the care team in [facility] and look for a) attending/consulting TRH provider listed and b) the [facility] team listed 2. Log into [email or web address] and use [place]'s universal password to access. If you do not have the password, please contact the hospital operator. 3. Locate the [facility] provider you are looking for under [facility] and page to a number that you can be directly reached. 4. If you still have difficulty reaching the provider, please page the [facility] (Director on Call) for the Hospitalists listed on amion for assistance.   02/21/2019, 5:57 PM

## 2019-02-21 NOTE — ED Triage Notes (Signed)
Triage notes entered under Lanny Hurst ,[place] were entered by ADm RN not NT .Luellen Pucker RN

## 2019-02-21 NOTE — ED Triage Notes (Signed)
TTS eval inporgress

## 2019-02-21 NOTE — ED Provider Notes (Signed)
[facility] EMERGENCY DEPARTMENT Provider Note   CSN: [id number] Arrival date & time: 02/21/19  0175     History Chief Complaint  Patient presents with  . Abdominal Pain    Christina [name] is a 69 y.o. female.  The history is provided by the patient and medical records. No language interpreter was used.  Abdominal Pain Pain location:  Generalized Pain quality: aching, bloating and burning   Pain severity:  Severe Onset quality:  Gradual Duration:  1 day Timing:  Constant Progression:  Unchanged Chronicity:  Recurrent Context: previous surgery and recent illness   Context: not trauma   Relieved by:  Nothing Worsened by:  Palpation Ineffective treatments:  None tried Associated symptoms: dysuria, nausea and vomiting   Associated symptoms: no chest pain, no chills, no constipation, no cough, no diarrhea, no fatigue, no fever, no flatus and no shortness of breath   Risk factors: multiple surgeries and recent hospitalization        Past Medical History:  Diagnosis Date  . Anemia   . Anxiety   . Arthritis   . Asthma   . Atrial fibrillation ([place])   . Chronic kidney disease    Dialysis T/Th/Sa  started in March 2020  . COPD (chronic obstructive pulmonary disease) ([place])   . Coronary artery disease    2 stents  . Depression   . GERD (gastroesophageal reflux disease)   . Gout   . Headache    migraines  . History of kidney stones   . Hyperlipidemia   . Hypertension   . Pneumonia   . Small bowel obstruction [facility])     Patient Active Problem List   Diagnosis Date Noted  . Respiratory failure ([place]) 02/04/2019  . Acute and chr resp failure, unsp w hypoxia or hypercapnia ([place])   . Atrial fibrillation with RVR ([place]) 01/25/2019  . SBO (small bowel obstruction) ([place]) 08/10/2018  . Anemia due to chronic blood loss   . Heme positive stool   . Platelet inhibition due to Plavix   . Acute GI bleeding 05/06/2018  . ESRD (end stage renal disease) ([place])  05/06/2018  . Anxiety 05/06/2018  . Bipolar affective ([place]) 05/06/2018  . CAD (coronary artery disease), native coronary artery 05/06/2018  . COPD with chronic bronchitis ([place]) 05/06/2018  . CVA (cerebral vascular accident) ([place]) 05/06/2018  . Essential hypertension 05/06/2018  . Hyperlipidemia 05/06/2018    Past Surgical History:  Procedure Laterality Date  . ABDOMINAL HYSTERECTOMY    . ABDOMINAL SURGERY     for small bowel obstruction - x 2  . APPENDECTOMY    . AV FISTULA PLACEMENT Left 08/04/2018   Procedure: ARTERIOVENOUS (AV) FISTULA CREATION LEFT ARM;  Surgeon: Waynetta Sandy, MD;  Location: [facility];  Service: Vascular;  Laterality: Left;  . BASCILIC VEIN TRANSPOSITION Left 11/24/2018   Procedure: SECOND STAGE BASILIC VEIN TRANSPOSITION LEFT ARM;  Surgeon: Waynetta Sandy, MD;  Location: [facility];  Service: Vascular;  Laterality: Left;  . CARDIAC CATHETERIZATION    . CORONARY ANGIOPLASTY  ?2003/2004  . FACIAL RECONSTRUCTION SURGERY     x 2  . HERNIA REPAIR       OB History   No obstetric history on file.     No family history on file.  Social History   Tobacco Use  . Smoking status: Former Research scientist (life sciences)  . Smokeless tobacco: Never Used  Substance Use Topics  . Alcohol use: Not Currently    Comment: quit about  6 months ago - "I was a drunk"  . Drug use: Not Currently    Types: Marijuana    Comment: h/o drug use - "I was raised in the 60s"; last use 18 months ago; smokes marijuana periodically    Home Medications Prior to Admission medications   Medication Sig Start Date End Date Taking? Authorizing Provider  albuterol (PROVENTIL) (2.5 MG/3ML) 0.083% nebulizer solution Take 2.5 mg by nebulization every 6 (six) hours as needed for wheezing or shortness of breath.    [provider]  allopurinol (ZYLOPRIM) 100 MG tablet Take 100 mg by mouth daily. 02/18/18   [provider]  amLODipine (NORVASC) 10 MG tablet Take 10 mg by mouth daily.     [provider]  aspirin EC 81 MG tablet Take 81 mg by mouth daily.    [provider]  AURYXIA 1 GM 210 MG(Fe) tablet Take 420 mg by mouth See admin instructions. Take 3 tablets (630 mg) by mouth with each meal & take 2 tablets (420 mg) by mouth with each snack 08/16/18   [provider]  Carboxymethylcellul-Glycerin (LUBRICATING EYE DROPS OP) Place 1 drop into both eyes daily as needed (dry eyes).    [provider]  Cholecalciferol (VITAMIN D3) 50 MCG (2000 UT) capsule Take 2,000 Units by mouth daily.     [provider]  dicyclomine (BENTYL) 10 MG capsule Take 20 mg by mouth at bedtime.  08/27/17   [provider]  docusate sodium (COLACE) 100 MG capsule Take 1 capsule (100 mg total) by mouth 2 (two) times daily. 08/11/18   Hongalgi, Lenis Dickinson, MD  doxylamine, Sleep, (UNISOM) 25 MG tablet Take 25 mg by mouth at bedtime as needed for sleep.    [provider]  furosemide (LASIX) 80 MG tablet Take 80 mg by mouth See admin instructions. Take one tablet (40 mg totally) by mouth on TUE THUR SAT before dialysis. 10/21/18   [provider]  gabapentin (NEURONTIN) 300 MG capsule Take 600 mg by mouth at bedtime as needed (leg pain.).  08/17/18   [provider]  Ipratropium-Albuterol (COMBIVENT) 20-100 MCG/ACT AERS respimat Inhale 2 puffs into the lungs 4 (four) times daily as needed for wheezing. 09/11/17   [provider]  isosorbide mononitrate (IMDUR) 30 MG 24 hr tablet Take 30 mg by mouth daily. 08/17/18   [provider]  lidocaine-prilocaine (EMLA) cream Apply 1 application topically daily as needed (1-2 hours before dialysis (cover with occlusive dressing)).  09/03/18   [provider]  metoprolol tartrate (LOPRESSOR) 100 MG tablet Take 1 tablet (100 mg total) by mouth 2 (two) times daily. 01/26/19   Little Ishikawa, MD  nitroGLYCERIN (NITROSTAT) 0.4 MG SL tablet Place 0.4 mg under the tongue every 5  (five) minutes as needed for chest pain.  09/18/16   [provider]  ondansetron (ZOFRAN) 4 MG tablet Take 4 mg by mouth 3 (three) times daily as needed for nausea. 09/14/18   [provider]  pantoprazole (PROTONIX) 40 MG tablet Take 1 tablet (40 mg total) by mouth daily. Patient taking differently: Take 40 mg by mouth daily as needed (acid reflux/indigestion.).  08/11/18   Hongalgi, Lenis Dickinson, MD  polyethylene glycol (MIRALAX / GLYCOLAX) 17 g packet Take 17 g by mouth 2 (two) times daily. Patient taking differently: Take 17 g by mouth every other day.  08/11/18   Hongalgi, Lenis Dickinson, MD  pravastatin (PRAVACHOL) 80 MG tablet Take 80 mg by  mouth every evening.  06/30/16   [provider]  sennosides-docusate sodium (SENOKOT-S) 8.6-50 MG tablet Take 1 tablet by mouth 2 (two) times daily.     [provider]  SYMBICORT 160-4.5 MCG/ACT inhaler Inhale 2 puffs into the lungs every 4 (four) hours as needed (for wheezing).  09/16/18   [provider]    Allergies    Cyclobenzaprine, Morphine, Penicillins, Codeine, and Hydromorphone  Review of Systems   Review of Systems  Constitutional: Negative for chills, diaphoresis, fatigue and fever.  HENT: Negative for congestion.   Respiratory: Negative for cough, chest tightness, shortness of breath, wheezing and stridor.   Cardiovascular: Positive for palpitations. Negative for chest pain and leg swelling.  Gastrointestinal: Positive for abdominal distention, abdominal pain, nausea and vomiting. Negative for constipation, diarrhea and flatus.  Genitourinary: Positive for dysuria. Negative for flank pain.  Musculoskeletal: Negative for back pain, neck pain and neck stiffness.  Skin: Negative for wound.  Neurological: Negative for light-headedness and headaches.  Psychiatric/Behavioral: Negative for agitation.  All other systems reviewed and are negative.   Physical Exam Updated Vital Signs BP (!) 151/84 (BP Location:  Right Arm)   Pulse (!) 141   Temp 98.3 F (36.8 C) (Oral)   Resp (!) 22   Ht 5\' 2"  (1.575 m)   SpO2 98% Comment: 2 liters  BMI 19.31 kg/m   Physical Exam Vitals and nursing note reviewed.  Constitutional:      General: She is not in acute distress.    Appearance: She is well-developed. She is not ill-appearing, toxic-appearing or diaphoretic.  HENT:     Head: Normocephalic and atraumatic.     Right Ear: External ear normal.     Left Ear: External ear normal.     Nose: Nose normal. No congestion or rhinorrhea.     Mouth/Throat:     Mouth: Mucous membranes are moist.     Pharynx: No oropharyngeal exudate.  Eyes:     Conjunctiva/sclera: Conjunctivae normal.     Pupils: Pupils are equal, round, and reactive to light.  Cardiovascular:     Rate and Rhythm: Tachycardia present. Rhythm irregular.     Pulses: Normal pulses.     Heart sounds: Normal heart sounds. No murmur.  Pulmonary:     Effort: Pulmonary effort is normal. No respiratory distress.     Breath sounds: No stridor. No wheezing, rhonchi or rales.  Chest:     Chest wall: No tenderness.  Abdominal:     General: Abdomen is flat. Bowel sounds are decreased. There is no distension.     Palpations: Abdomen is soft.     Tenderness: There is generalized abdominal tenderness. There is no right CVA tenderness, left CVA tenderness, guarding or rebound.  Musculoskeletal:        General: No tenderness.     Cervical back: Normal range of motion and neck supple.     Right lower leg: No edema.     Left lower leg: No edema.  Skin:    General: Skin is warm.     Capillary Refill: Capillary refill takes less than 2 seconds.     Findings: No erythema or rash.  Neurological:     General: No focal deficit present.     Mental Status: She is alert and oriented to person, place, and time.     Motor: No abnormal muscle tone.     Coordination: Coordination normal.     Deep Tendon Reflexes: Reflexes are normal and symmetric.  Psychiatric:        Mood and Affect: Mood normal.     ED Results / Procedures / Treatments   Labs (all labs ordered are listed, but only abnormal results are displayed) Labs Reviewed  CBC WITH DIFFERENTIAL/PLATELET - Abnormal; Notable for the following components:      Result Value   WBC 10.8 (*)    Neutro Abs 8.1 (*)    Monocytes Absolute 1.2 (*)    All other components within normal limits  COMPREHENSIVE METABOLIC PANEL - Abnormal; Notable for the following components:   Potassium 5.2 (*)    CO2 21 (*)    Glucose, Bld 124 (*)    BUN 59 (*)    Creatinine, Ser 6.57 (*)    Albumin 3.3 (*)    GFR calc non Af Amer 6 (*)    GFR calc Af Amer 7 (*)    Anion gap 16 (*)    All other components within normal limits  LIPASE, BLOOD - Abnormal; Notable for the following components:   Lipase 201 (*)    All other components within normal limits  URINALYSIS, ROUTINE W REFLEX MICROSCOPIC - Abnormal; Notable for the following components:   Protein, ur 100 (*)    Bacteria, UA RARE (*)    All other components within normal limits  CBC - Abnormal; Notable for the following components:   RBC 3.73 (*)    Hemoglobin 11.5 (*)    HCT 35.5 (*)    All other components within normal limits  TROPONIN I (HIGH SENSITIVITY) - Abnormal; Notable for the following components:   Troponin I (High Sensitivity) 24 (*)    All other components within normal limits  TROPONIN I (HIGH SENSITIVITY) - Abnormal; Notable for the following components:   Troponin I (High Sensitivity) 29 (*)    All other components within normal limits  RESPIRATORY PANEL BY RT PCR (FLU A&B, COVID)  URINE CULTURE  LACTIC ACID, PLASMA  LACTIC ACID, PLASMA  MAGNESIUM    EKG EKG Interpretation  Date/Time:  Monday February 21 2019 07:35:57 EST Ventricular Rate:  148 PR Interval:    QRS Duration: 82 QT Interval:  335 QTC Calculation: 526 R Axis:   78 Text Interpretation: Atrial fibrillation Consider left ventricular hypertrophy  Prolonged QT interval When comapred to prior, similar Afib with RVR and long QTc. No STEMI Confirmed by Antony Blackbird [phone]) on 02/21/2019 8:38:58 AM   Radiology CT ABDOMEN PELVIS WO CONTRAST  Result Date: 02/21/2019 CLINICAL DATA:  Abdominal pain with nausea and vomiting. History of multiple small bowel obstructions. EXAM: CT ABDOMEN AND PELVIS WITHOUT CONTRAST TECHNIQUE: Multidetector CT imaging of the abdomen and pelvis was performed following the standard protocol without IV contrast. COMPARISON:  CT abdomen pelvis dated September 15, 2018. FINDINGS: Lower chest: No acute abnormality. Unchanged mild scarring at the lung bases. Hepatobiliary: No focal liver abnormality is seen. No gallstones, gallbladder wall thickening, or biliary dilatation. Pancreas: Unremarkable. No pancreatic ductal dilatation or surrounding inflammatory changes. Spleen: Normal in size without focal abnormality. Adrenals/Urinary Tract: Unchanged bilateral adrenal adenomas measuring 2.5 cm on the left and 2.2 cm on the right. Unchanged bilateral renal atrophy, small cysts, and right greater than left renovascular calcifications. No definite renal calculi. No hydronephrosis. The bladder is unremarkable. Stomach/Bowel: Stomach is within normal limits. Prior appendectomy. Unchanged small bowel anastomosis in the left abdomen. No evidence of bowel wall thickening, distention, or inflammatory changes. Oral contrast reaches the sigmoid colon. Unchanged duodenal diverticulum. Vascular/Lymphatic: Aortic atherosclerosis.  No enlarged abdominal or pelvic lymph nodes. Reproductive: Status post hysterectomy. No adnexal masses. Other: No free fluid or pneumoperitoneum. Unchanged small fat containing ventral supraumbilical hernias. Musculoskeletal: No acute or significant osseous findings. IMPRESSION: 1. No acute intra-abdominal process. 2. Unchanged bilateral adrenal adenomas. 3.  Aortic atherosclerosis (ICD10-I70.0). Electronically Signed   By:  Titus Dubin M.D.   On: 02/21/2019 11:40   DG Chest Portable 1 View  Result Date: 02/21/2019 CLINICAL DATA:  Tachycardia EXAM: PORTABLE CHEST 1 VIEW COMPARISON:  February 05, 2019 FINDINGS: Endotracheal tube and nasogastric tube have been removed. Central catheter tip is in the superior vena cava near the cavoatrial junction, unchanged. There is mild atelectatic change in the left mid lung and left base. Lungs elsewhere are clear. Heart is upper normal in size with pulmonary vascularity normal. No adenopathy. There is aortic atherosclerosis. No bone lesions. IMPRESSION: Areas of mild atelectatic change on the left. Lungs elsewhere clear. Stable cardiac silhouette. No adenopathy. Central catheter tip in superior vena cava near cavoatrial junction, stable. Aortic Atherosclerosis (ICD10-I70.0). Electronically Signed   By: Lowella Grip III M.D.   On: 02/21/2019 08:10    Procedures Procedures (including critical care time)  CRITICAL CARE Performed by: Gwenyth Allegra Zakiya Sporrer Total critical care time: 35 minutes Critical care time was exclusive of separately billable procedures and treating other patients. Critical care was necessary to treat or prevent imminent or life-threatening deterioration. Critical care was time spent personally by me on the following activities: development of treatment plan with patient and/or surrogate as well as nursing, discussions with consultants, evaluation of patient's response to treatment, examination of patient, obtaining history from patient or surrogate, ordering and performing treatments and interventions, ordering and review of laboratory studies, ordering and review of radiographic studies, pulse oximetry and re-evaluation of patient's condition.   Medications Ordered in ED Medications  0.9 %  sodium chloride infusion (has no administration in time range)  alteplase (CATHFLO ACTIVASE) injection 2 mg (has no administration in time range)  heparin injection  1,000 Units (has no administration in time range)  lidocaine (PF) (XYLOCAINE) 1 % injection 5 mL (has no administration in time range)  lidocaine-prilocaine (EMLA) cream 1 application (has no administration in time range)  pentafluoroprop-tetrafluoroeth (GEBAUERS) aerosol 1 application (has no administration in time range)  0.9 %  sodium chloride infusion (has no administration in time range)  allopurinol (ZYLOPRIM) tablet 100 mg (has no administration in time range)  aspirin EC tablet 81 mg (has no administration in time range)  furosemide (LASIX) tablet 40 mg (has no administration in time range)  isosorbide mononitrate (IMDUR) 24 hr tablet 30 mg (has no administration in time range)  metoprolol tartrate (LOPRESSOR) tablet 100 mg (has no administration in time range)  pravastatin (PRAVACHOL) tablet 80 mg (80 mg Oral Given 02/21/19 1727)  doxylamine (Sleep) (UNISOM) tablet 25 mg (has no administration in time range)  dicyclomine (BENTYL) capsule 20 mg (has no administration in time range)  polyethylene glycol (MIRALAX / GLYCOLAX) packet 17 g (17 g Oral Given 02/21/19 1727)  senna-docusate (Senokot-S) tablet 1 tablet (has no administration in time range)  gabapentin (NEURONTIN) capsule 600 mg (has no administration in time range)  albuterol (PROVENTIL) (2.5 MG/3ML) 0.083% nebulizer solution 2.5 mg (has no administration in time range)  ipratropium-albuterol (DUONEB) 0.5-2.5 (3) MG/3ML nebulizer solution 3 mL (3 mLs Inhalation Not Given 02/21/19 1812)  mometasone-formoterol (DULERA) 200-5 MCG/ACT inhaler 2 puff (has no administration in time range)  hydroxypropyl methylcellulose / hypromellose (ISOPTO  TEARS / GONIOVISC) 2.5 % ophthalmic solution 1 drop (has no administration in time range)  acetaminophen (TYLENOL) tablet 650 mg (has no administration in time range)  ondansetron (ZOFRAN) injection 4 mg (has no administration in time range)  zolpidem (AMBIEN) tablet 5 mg (has no administration in  time range)  sorbitol 70 % solution 30 mL (has no administration in time range)  docusate sodium (ENEMEEZ) enema 283 mg (has no administration in time range)  camphor-menthol (SARNA) lotion 1 application (has no administration in time range)    And  hydrOXYzine (ATARAX/VISTARIL) tablet 25 mg (has no administration in time range)  calcium carbonate (dosed in mg elemental calcium) suspension 500 mg of elemental calcium (has no administration in time range)  feeding supplement (NEPRO CARB STEADY) liquid 237 mL (has no administration in time range)  diltiazem (CARDIZEM) 125 mg in dextrose 5% 125 mL (1 mg/mL) infusion (5 mg/hr Intravenous Restarted 02/21/19 1708)  Chlorhexidine Gluconate Cloth 2 % PADS 6 each (has no administration in time range)  ferric citrate (AURYXIA) tablet 630 mg (630 mg Oral Given 02/21/19 1754)    And  ferric citrate (AURYXIA) tablet 420 mg (has no administration in time range)  heparin injection 5,000 Units (has no administration in time range)  fentaNYL (SUBLIMAZE) injection 50 mcg (50 mcg Intravenous Given 02/21/19 0845)    ED Course  I have reviewed the triage vital signs and the nursing notes.  Pertinent labs & imaging results that were available during my care of the patient were reviewed by me and considered in my medical decision making (see chart for details).    MDM Rules/Calculators/A&P                      Christina [name] is a 69 y.o. female with a past medical history significant for hypertension, hyperlipidemia, gout, asthma, ESRD on dialysis TTS, prior stroke, COPD, CAD, atrial fibrillation not on anticoagulation due to prior GI bleed, multiple prior bowel obstruction status post abdominal surgery, and recent admission for respiratory failure requiring intubation several weeks ago who presents with severe abdominal pain, nausea, vomiting, and tachycardia.  Patient reports that several weeks ago she was admitted to the hospital for respiratory distress and  had to be intubated.  She was successfully discharged home after this but was not taken to dialysis this past week except for part of one session.  She reports she missed the previous Saturday, Tuesday, and only went for part of Thursday because she did not have a ride to pick her up.  She reports that yesterday she started having severe nausea and vomiting with bowel movements.  She was not having pain at that time but reports today she had severe abdominal pain that is not 8 out of 10 across her abdomen.  She reports it is distended.  She has not been passing much flatus and has not had a bowel movement today.  She reports this feels "the same" as her previous bowel obstructions.  She denies fevers or chills.  She reports that she still makes urine and is having some dysuria similar to when she had UTI in the past.  She also reports he is having palpitations and feeling her heart racing and her rate is in the 140s on arrival.  She appears to be in A. fib with RVR on a telemetry monitor.  She denies any chest pain at this time.  No shortness of breath.  She denies other complaints on arrival.  On exam, patient's heart rate is in the 140s and it is irregular on pulse.  Her lungs are clear and her chest is nontender.  Abdomen is diffusely tender with some mild distention.  Bowel sounds were not appreciated.  Patient moving all extremities.  No significant edema in her legs.  Clinically most concern about small bowel obstruction recurrence given the similar to prior symptoms.  There is also family distention and nausea and vomiting previously.  UTI considered given her history of UTI, her continued urination, and the dysuria.  Will check urine.  As she still makes urine, will do a noncontrast CT scan for her kidneys.  Will get labs to make sure she is not emergent dialysis as she has missed 2 and half sessions.  Will get labs to help assess for electrolyte imbalance with the nausea and vomiting and her fast heart  rate.  Some sort of arterial emboli from A. fib not on anticoagulation causing ischemic bowel also considered however for similar to prior bowel direction and currently making urine causes to look for obstruction initially.  Will get chest x-ray and also test for Covid in case she needs a emergent surgery today.  As she is missed 2 and half dialysis sessions, we will hold on fluids but will instead start a diltiazem drip to help with the A. fib with RVR to start with.  Anticipate admission after work-up.  2:08 PM Work-up is returned showing no bowel obstruction.  Covid test negative.  Troponin elevated but similar.  Lactic acid not elevated.  Mild leukocytosis.  No anemia.  Lipase elevated, suspect a component of pancreatitis.  Patient is still in A. fib with RVR but rate has trended down with the diltiazem drip.  Due to A. fib with RVR and abdominal pain with pancreatitis, patient will be admitted for further management.   Final Clinical Impression(s) / ED Diagnoses Final diagnoses:  Acute pancreatitis, unspecified complication status, unspecified pancreatitis type  Paroxysmal atrial fibrillation with RVR ([facility])    Clinical Impression: 1. Acute pancreatitis, unspecified complication status, unspecified pancreatitis type   2. Paroxysmal atrial fibrillation with RVR ([place])     Disposition: Admit  This note was prepared with assistance of Dragon voice recognition software. Occasional wrong-word or sound-a-like substitutions may have occurred due to the inherent limitations of voice recognition software.     Henrietta Cieslewicz, Gwenyth Allegra, MD 02/21/19 [phone]

## 2019-02-21 NOTE — Plan of Care (Signed)

## 2019-02-22 DIAGNOSIS — R109 Unspecified abdominal pain: Secondary | ICD-10-CM

## 2019-02-22 DIAGNOSIS — I480 Paroxysmal atrial fibrillation: Secondary | ICD-10-CM | POA: Diagnosis not present

## 2019-02-22 DIAGNOSIS — K859 Acute pancreatitis without necrosis or infection, unspecified: Secondary | ICD-10-CM | POA: Diagnosis not present

## 2019-02-22 DIAGNOSIS — I4891 Unspecified atrial fibrillation: Secondary | ICD-10-CM | POA: Diagnosis not present

## 2019-02-22 DIAGNOSIS — J449 Chronic obstructive pulmonary disease, unspecified: Secondary | ICD-10-CM | POA: Diagnosis not present

## 2019-02-22 LAB — URINE CULTURE

## 2019-02-22 LAB — PHOSPHORUS: Phosphorus: 5.9 mg/dL — ABNORMAL HIGH (ref 2.5–4.6)

## 2019-02-22 MED ORDER — HEPARIN SODIUM (PORCINE) 1000 UNIT/ML IJ SOLN
INTRAMUSCULAR | Status: AC
  Filled 2019-02-22: qty 4

## 2019-02-22 MED ORDER — IPRATROPIUM-ALBUTEROL 0.5-2.5 (3) MG/3ML IN SOLN: 3.0000 mL | RESPIRATORY_TRACT | Status: DC | PRN

## 2019-02-22 MED ORDER — GUAIFENESIN 100 MG/5ML PO SOLN
5.0000 mL | ORAL | Status: AC | PRN
  Administered 2019-02-22: 100 mg via ORAL
  Filled 2019-02-22: qty 5

## 2019-02-22 MED ORDER — IPRATROPIUM-ALBUTEROL 0.5-2.5 (3) MG/3ML IN SOLN
3.0000 mL | Freq: Four times a day (QID) | RESPIRATORY_TRACT | Status: DC
  Administered 2019-02-22 (×2): 3 mL via RESPIRATORY_TRACT
  Filled 2019-02-22 (×2): qty 3

## 2019-02-22 NOTE — Plan of Care (Signed)

## 2019-02-22 NOTE — Progress Notes (Signed)
Chaplain engaged in initial visit with [facility].  Mliss Sax and chaplain prayed together.

## 2019-02-22 NOTE — Progress Notes (Addendum)
[facility]  PROGRESS NOTE  Christina [name] ZDG:[id number] DOB: 04-13-1950 DOA: 02/21/2019 PCP: Maggie Schwalbe, PA-C   Brief HPI:   69 year old female with a history of small bowel obstruction, hypertension, hyperlipidemia, depression, CAD with stent placement, COPD, ESRD on dialysis Tuesday Thursday and Saturday, came with abdominal pain with several missed HD treatments.  CT of the abdomen showed no SBO.  Lipase was 200.  She was found to be on atrial fibrillation with RVR, started on Cardizem drip.   Subjective   Patient seen and examined, currently on Cardizem 5 mg/h.  Heart rate is controlled.   Assessment/Plan:     1. Atrial fibrillation with RVR-patient has history of atrial fibrillation, unknown if persistent, paroxysmal or chronic presented with RVR.  Started on Cardizem drip.  Currently at 5 mg/h, she takes metoprolol 100 mg p.o. twice daily at home.  Will restart metoprolol and wean off Cardizem.  If heart rate is not controlled we will have to add low-dose of Cardizem for rate control. CHA2DS2VASc score is at least 4.  She has not been on anticoagulation despite known history of A. fib.  Due to history of GI bleed.  She has a cardiologist Dr. Maia Petties in [place].  She has an appointment to see Dr. Cristobal Goldmann next month.  I will withhold initiating anticoagulation at this time due to previous history of GI bleed.  2. Abdominal pain-resolved, abdominal CT was unremarkable.  She had mild elevation of lipase 201.  Patient is eating without any complaints.  No nausea, vomiting.  Had BM yesterday.  Will monitor.  3. ESRD with missed hemodialysis-patient is on hemodialysis TTS.  She missed both Tuesday and Saturday and half of Thursday last week, due to transportation issues.  Nephrology has been consulted.  Patient underwent hemodialysis yesterday.  Continue ferric citrate, furosemide.  4. Hypertension-continue Lopressor  5. CAD s/p stent placement-continue Lopressor, Imdur      SpO2: 98 % O2 Flow Rate (L/min): 2 L/min     Lab Results  Component Value Date   SARSCOV2NAA NEGATIVE 02/21/2019   SARSCOV2NAA NEGATIVE 02/04/2019   [place] NEGATIVE 01/25/2019   SARSCOV2NAA NOT DETECTED 11/22/2018     CBG: No results for input(s): GLUCAP in the last 168 hours.  CBC: Recent Labs  Lab 02/21/19 0834 02/21/19 1621  WBC 10.8* 8.3  NEUTROABS 8.1*  --   HGB 12.6 11.5*  HCT 39.1 35.5*  MCV 95.6 95.2  PLT 287 951    Basic Metabolic Panel: Recent Labs  Lab 02/21/19 0834  NA 137  K 5.2*  CL 100  CO2 21*  GLUCOSE 124*  BUN 59*  CREATININE 6.57*  CALCIUM 9.2  MG 2.0     Liver Function Tests: Recent Labs  Lab 02/21/19 0834  AST 18  ALT 12  ALKPHOS 67  BILITOT 1.0  PROT 7.3  ALBUMIN 3.3*      DVT prophylaxis: Heparin  Code Status: Full code  Family Communication: No family at bedside  Disposition Plan: likely home when medically ready for discharge      Scheduled medications:  . allopurinol  100 mg Oral Daily  . aspirin EC  81 mg Oral Daily  . Chlorhexidine Gluconate Cloth  6 each Topical Q0600  . dicyclomine  20 mg Oral QHS  . ferric citrate  630 mg Oral TID WC   And  . ferric citrate  420 mg Oral With snacks  . furosemide  40 mg Oral Q T,Th,Sat-1800  . heparin injection (  subcutaneous)  5,000 Units Subcutaneous Q8H  . ipratropium-albuterol  3 mL Inhalation QID  . isosorbide mononitrate  30 mg Oral Daily  . metoprolol tartrate  100 mg Oral BID  . mometasone-formoterol  2 puff Inhalation BID  . polyethylene glycol  17 g Oral QODAY  . pravastatin  80 mg Oral QPM  . senna-docusate  1 tablet Oral BID    Consultants:  Nephrology  Procedures:    Antibiotics:   Anti-infectives (From admission, onward)   None       Objective   Vitals:   02/22/19 0753 02/22/19 0817 02/22/19 0900 02/22/19 1000  BP:  (!) 182/99    Pulse: 81 73 78 73  Resp: 16 18 19 15   Temp:  98 F (36.7 C)    TempSrc:  Oral    SpO2:  100% 98%    Weight:      Height:        Intake/Output Summary (Last 24 hours) at 02/22/2019 1035 Last data filed at 02/22/2019 0818 Gross per 24 hour  Intake 539.75 ml  Output 1500 ml  Net -960.25 ml    01/24 1901 - 01/26 0700 In: 299.8 [P.O.:240; I.V.:59.8] Out: 1500   Filed Weights   02/21/19 1625 02/21/19 1825 02/21/19 2105  Weight: 47.7 kg 48.4 kg 47 kg    Physical Examination:   General-appears in no acute distress Heart-S1-S2, irregular, no murmur auscultated Lungs-clear to auscultation bilaterally, no wheezing or crackles auscultated Abdomen-soft, nontender, no organomegaly Extremities-no edema in the lower extremities Neuro-alert, oriented x3, no focal deficit noted   Data Reviewed:   Recent Results (from the past 240 hour(s))  Respiratory Panel by RT PCR (Flu A&B, Covid) - Nasopharyngeal Swab     Status: None   Collection Time: 02/21/19 12:36 PM   Specimen: Nasopharyngeal Swab  Result Value Ref Range Status   SARS Coronavirus 2 by RT PCR NEGATIVE NEGATIVE Final    Comment: (NOTE) SARS-CoV-2 target nucleic acids are NOT DETECTED. The SARS-CoV-2 RNA is generally detectable in upper respiratoy specimens during the acute phase of infection. The lowest concentration of SARS-CoV-2 viral copies this assay can detect is 131 copies/mL. A negative result does not preclude SARS-Cov-2 infection and should not be used as the sole basis for treatment or other patient management decisions. A negative result may occur with  improper specimen collection/handling, submission of specimen other than nasopharyngeal swab, presence of viral mutation(s) within the areas targeted by this assay, and inadequate number of viral copies (<131 copies/mL). A negative result must be combined with clinical observations, patient history, and epidemiological information. The expected result is Negative. Fact Sheet for Patients:  PinkCheek.be Fact Sheet for  Healthcare Providers:  GravelBags.it This test is not yet ap proved or cleared by the [place] FDA and  has been authorized for detection and/or diagnosis of SARS-CoV-2 by FDA under an Emergency Use Authorization (EUA). This EUA will remain  in effect (meaning this test can be used) for the duration of the COVID-19 declaration under Section 564(b)(1) of the Act, 21 U.S.C. section 360bbb-3(b)(1), unless the authorization is terminated or revoked sooner.    Influenza A by PCR NEGATIVE NEGATIVE Final   Influenza B by PCR NEGATIVE NEGATIVE Final    Comment: (NOTE) The Xpert Xpress SARS-CoV-2/FLU/RSV assay is intended as an aid in  the diagnosis of influenza from Nasopharyngeal swab specimens and  should not be used as a sole basis for treatment. Nasal washings and  aspirates are unacceptable for Xpert  Xpress SARS-CoV-2/FLU/RSV  testing. Fact Sheet for Patients: PinkCheek.be Fact Sheet for Healthcare Providers: GravelBags.it This test is not yet approved or cleared by the [place] FDA and  has been authorized for detection and/or diagnosis of SARS-CoV-2 by  FDA under an Emergency Use Authorization (EUA). This EUA will remain  in effect (meaning this test can be used) for the duration of the  Covid-19 declaration under Section 564(b)(1) of the Act, 21  U.S.C. section 360bbb-3(b)(1), unless the authorization is  terminated or revoked. Performed at [facility], [place] [address] [place], [place] [address]     Recent Labs  Lab 02/21/19 [phone]  LIPASE 201*   No results for input(s): AMMONIA in the last 168 hours.  Cardiac Enzymes: No results for input(s): CKTOTAL, CKMB, CKMBINDEX, TROPONINI in the last 168 hours. BNP (last 3 results) Recent Labs    01/24/19 2228 02/04/19 0103  BNP 1,570.0* 4,197.2*    ProBNP (last 3 results) No results for input(s): PROBNP in the last 8760  hours.  Studies:  CT ABDOMEN PELVIS WO CONTRAST  Result Date: 02/21/2019 CLINICAL DATA:  Abdominal pain with nausea and vomiting. History of multiple small bowel obstructions. EXAM: CT ABDOMEN AND PELVIS WITHOUT CONTRAST TECHNIQUE: Multidetector CT imaging of the abdomen and pelvis was performed following the standard protocol without IV contrast. COMPARISON:  CT abdomen pelvis dated September 15, 2018. FINDINGS: Lower chest: No acute abnormality. Unchanged mild scarring at the lung bases. Hepatobiliary: No focal liver abnormality is seen. No gallstones, gallbladder wall thickening, or biliary dilatation. Pancreas: Unremarkable. No pancreatic ductal dilatation or surrounding inflammatory changes. Spleen: Normal in size without focal abnormality. Adrenals/Urinary Tract: Unchanged bilateral adrenal adenomas measuring 2.5 cm on the left and 2.2 cm on the right. Unchanged bilateral renal atrophy, small cysts, and right greater than left renovascular calcifications. No definite renal calculi. No hydronephrosis. The bladder is unremarkable. Stomach/Bowel: Stomach is within normal limits. Prior appendectomy. Unchanged small bowel anastomosis in the left abdomen. No evidence of bowel wall thickening, distention, or inflammatory changes. Oral contrast reaches the sigmoid colon. Unchanged duodenal diverticulum. Vascular/Lymphatic: Aortic atherosclerosis. No enlarged abdominal or pelvic lymph nodes. Reproductive: Status post hysterectomy. No adnexal masses. Other: No free fluid or pneumoperitoneum. Unchanged small fat containing ventral supraumbilical hernias. Musculoskeletal: No acute or significant osseous findings. IMPRESSION: 1. No acute intra-abdominal process. 2. Unchanged bilateral adrenal adenomas. 3.  Aortic atherosclerosis (ICD10-I70.0). Electronically Signed   By: Titus Dubin M.D.   On: 02/21/2019 11:40   DG Chest Portable 1 View  Result Date: 02/21/2019 CLINICAL DATA:  Tachycardia EXAM: PORTABLE CHEST 1  VIEW COMPARISON:  February 05, 2019 FINDINGS: Endotracheal tube and nasogastric tube have been removed. Central catheter tip is in the superior vena cava near the cavoatrial junction, unchanged. There is mild atelectatic change in the left mid lung and left base. Lungs elsewhere are clear. Heart is upper normal in size with pulmonary vascularity normal. No adenopathy. There is aortic atherosclerosis. No bone lesions. IMPRESSION: Areas of mild atelectatic change on the left. Lungs elsewhere clear. Stable cardiac silhouette. No adenopathy. Central catheter tip in superior vena cava near cavoatrial junction, stable. Aortic Atherosclerosis (ICD10-I70.0). Electronically Signed   By: Lowella Grip III M.D.   On: 02/21/2019 08:10     Admission status: Observation: Based on patients clinical presentation and evaluation of above clinical data, I have made determination that patient meets Inpatient criteria at this time.   Oswald Hillock   [facility] If 7PM-7AM, please contact  night-coverage at [email or web address], Office  [phone]  password [email or web address]  02/22/2019, 10:35 AM  LOS: 0 days

## 2019-02-22 NOTE — Plan of Care (Addendum)
Report given to hemodialysis 

## 2019-02-22 NOTE — Progress Notes (Signed)
Paged on-call for cough suppressant.

## 2019-02-22 NOTE — Progress Notes (Addendum)
[facility] Progress Note   Subjective:   Patient seen and examined at bedside.  Feeling better today.  Occasional palpitations.  Denies SOB, CP, n/v/d, abdominal pain, dizziness, weakness and fatigue.   Objective Vitals:   02/22/19 0753 02/22/19 0817 02/22/19 0900 02/22/19 1000  BP:  (!) 182/99    Pulse: 81 73 78 73  Resp: 16 18 19 15   Temp:  98 F (36.7 C)    TempSrc:  Oral    SpO2: 100% 98%    Weight:      Height:       Physical Exam General:NAD, chronically ill appearing female, sitting in bed Heart:RRR, +1/6 systolic murmur Lungs:CTAB Abdomen:soft, NTND Extremities:no LE edema Dialysis Access: LU AVF, [facility]   Filed Weights   02/21/19 1625 02/21/19 1825 02/21/19 2105  Weight: 47.7 kg 48.4 kg 47 kg    Intake/Output Summary (Last 24 hours) at 02/22/2019 1040 Last data filed at 02/22/2019 0818 Gross per 24 hour  Intake 539.75 ml  Output 1500 ml  Net -960.25 ml    Additional Objective Labs: Basic Metabolic Panel: Recent Labs  Lab 02/21/19 0834  NA 137  K 5.2*  CL 100  CO2 21*  GLUCOSE 124*  BUN 59*  CREATININE 6.57*  CALCIUM 9.2   Liver Function Tests: Recent Labs  Lab 02/21/19 0834  AST 18  ALT 12  ALKPHOS 67  BILITOT 1.0  PROT 7.3  ALBUMIN 3.3*   Recent Labs  Lab 02/21/19 0834  LIPASE 201*   CBC: Recent Labs  Lab 02/21/19 0834 02/21/19 1621  WBC 10.8* 8.3  NEUTROABS 8.1*  --   HGB 12.6 11.5*  HCT 39.1 35.5*  MCV 95.6 95.2  PLT 287 280   Blood Culture    Component Value Date/Time   SDES BLOOD RIGHT ARM 02/04/2019 0547   SDES BLOOD RIGHT HAND 02/04/2019 0547   SPECREQUEST  02/04/2019 0547    BOTTLES DRAWN AEROBIC AND ANAEROBIC Blood Culture adequate volume   SPECREQUEST  02/04/2019 0547    BOTTLES DRAWN AEROBIC AND ANAEROBIC Blood Culture results may not be optimal due to an inadequate volume of blood received in culture bottles   CULT  02/04/2019 0547    NO GROWTH 5 DAYS Performed at [facility],  [place] [address], [place], [place] [address]    CULT  02/04/2019 0547    NO GROWTH 5 DAYS Performed at [facility], [place] [address] [place], [place] [address]    REPTSTATUS 02/09/2019 FINAL 02/04/2019 0547   REPTSTATUS 02/09/2019 FINAL 02/04/2019 0547    Cardiac Enzymes: No results for input(s): CKTOTAL, CKMB, CKMBINDEX, TROPONINI in the last 168 hours. CBG: No results for input(s): GLUCAP in the last 168 hours. [place] Studies: No results for input(s): IRON, TIBC, TRANSFERRIN, FERRITIN in the last 72 hours. Lab Results  Component Value Date   INR 0.9 01/26/2019   INR 1.0 01/25/2019   INR 0.9 04/01/2008   Studies/Results: CT ABDOMEN PELVIS WO CONTRAST  Result Date: 02/21/2019 CLINICAL DATA:  Abdominal pain with nausea and vomiting. History of multiple small bowel obstructions. EXAM: CT ABDOMEN AND PELVIS WITHOUT CONTRAST TECHNIQUE: Multidetector CT imaging of the abdomen and pelvis was performed following the standard protocol without IV contrast. COMPARISON:  CT abdomen pelvis dated September 15, 2018. FINDINGS: Lower chest: No acute abnormality. Unchanged mild scarring at the lung bases. Hepatobiliary: No focal liver abnormality is seen. No gallstones, gallbladder wall thickening, or biliary dilatation. Pancreas: Unremarkable. No pancreatic ductal  dilatation or surrounding inflammatory changes. Spleen: Normal in size without focal abnormality. Adrenals/Urinary Tract: Unchanged bilateral adrenal adenomas measuring 2.5 cm on the left and 2.2 cm on the right. Unchanged bilateral renal atrophy, small cysts, and right greater than left renovascular calcifications. No definite renal calculi. No hydronephrosis. The bladder is unremarkable. Stomach/Bowel: Stomach is within normal limits. Prior appendectomy. Unchanged small bowel anastomosis in the left abdomen. No evidence of bowel wall thickening, distention, or inflammatory changes. Oral contrast reaches the sigmoid colon. Unchanged duodenal  diverticulum. Vascular/Lymphatic: Aortic atherosclerosis. No enlarged abdominal or pelvic lymph nodes. Reproductive: Status post hysterectomy. No adnexal masses. Other: No free fluid or pneumoperitoneum. Unchanged small fat containing ventral supraumbilical hernias. Musculoskeletal: No acute or significant osseous findings. IMPRESSION: 1. No acute intra-abdominal process. 2. Unchanged bilateral adrenal adenomas. 3.  Aortic atherosclerosis (ICD10-I70.0). Electronically Signed   By: Titus Dubin M.D.   On: 02/21/2019 11:40   DG Chest Portable 1 View  Result Date: 02/21/2019 CLINICAL DATA:  Tachycardia EXAM: PORTABLE CHEST 1 VIEW COMPARISON:  February 05, 2019 FINDINGS: Endotracheal tube and nasogastric tube have been removed. Central catheter tip is in the superior vena cava near the cavoatrial junction, unchanged. There is mild atelectatic change in the left mid lung and left base. Lungs elsewhere are clear. Heart is upper normal in size with pulmonary vascularity normal. No adenopathy. There is aortic atherosclerosis. No bone lesions. IMPRESSION: Areas of mild atelectatic change on the left. Lungs elsewhere clear. Stable cardiac silhouette. No adenopathy. Central catheter tip in superior vena cava near cavoatrial junction, stable. Aortic Atherosclerosis (ICD10-I70.0). Electronically Signed   By: Lowella Grip III M.D.   On: 02/21/2019 08:10    Medications: . sodium chloride    . sodium chloride    . diltiazem (CARDIZEM) infusion 2.5 mg/hr (02/22/19 0815)   . allopurinol  100 mg Oral Daily  . aspirin EC  81 mg Oral Daily  . Chlorhexidine Gluconate Cloth  6 each Topical Q0600  . dicyclomine  20 mg Oral QHS  . ferric citrate  630 mg Oral TID WC   And  . ferric citrate  420 mg Oral With snacks  . furosemide  40 mg Oral Q T,Th,Sat-1800  . heparin injection (subcutaneous)  5,000 Units Subcutaneous Q8H  . ipratropium-albuterol  3 mL Inhalation QID  . isosorbide mononitrate  30 mg Oral Daily  .  metoprolol tartrate  100 mg Oral BID  . mometasone-formoterol  2 puff Inhalation BID  . polyethylene glycol  17 g Oral QODAY  . pravastatin  80 mg Oral QPM  . senna-docusate  1 tablet Oral BID    Dialysis Orders: TTS - [facility]  4hrs, BFR 250, DFR AF1.5,  EDW 47kg, 2K/ 2Ca  Access: LU AVF, TDC  Heparin none Mircera 75 mcg q2wks - last 02/17/19 Venofer 50mg  qHD Calcitriol 0.75 mcg PO qHD   Assessment/Plan: 1.  A fib w/RVR - improved on diltiazem. Per primary 2. Abdominal pain/n/v - Resolved. ?pancreatitis, lipase 201. CT negative for SBO.  Per primary. 3.  ESRD -  On HD TTS.  Missed 2 of 3 HD last week.  HD last night tolerated well, plan for HD again today to bet back on schedule. Last K 5.2.  4.  Hypertension/volume  - BP elevated.  At home on amlodipine 10mg , metoprolol tartrate 100mg  BID and lasix 80mg  qd on non HD days.  Some improvement in BP post dialysis, increased again this AM.  Plan for HD w/UF 1-2L as tolerated today.  5.  Anemia of CKD - Hgb 12.6, no indication for ESA at this time 6.  Secondary Hyperparathyroidism -  Ca at goal. Will check phos. Continue binders and VDRA.  7.  Nutrition - Renal diet w/fluid restrictions.  8. COPD 9. CAD s/p stents  Jen Mow, PA-C [facility] Pager: [phone] 02/22/2019,10:40 AM  LOS: 0 days   Pt seen, examined and agree w A/P as above.  Kelly Splinter  MD 02/22/2019, 11:32 AM

## 2019-02-22 NOTE — Progress Notes (Signed)
Per verbal order from Dr. Darrick Meigs, he advised to wean pt off of her Cardizem drip.  Turned drip form 5 mg to 2.5 mg

## 2019-02-23 DIAGNOSIS — I480 Paroxysmal atrial fibrillation: Secondary | ICD-10-CM

## 2019-02-23 DIAGNOSIS — R1084 Generalized abdominal pain: Secondary | ICD-10-CM

## 2019-02-23 DIAGNOSIS — J449 Chronic obstructive pulmonary disease, unspecified: Secondary | ICD-10-CM

## 2019-02-23 DIAGNOSIS — Z9115 Patient's noncompliance with renal dialysis: Secondary | ICD-10-CM

## 2019-02-23 DIAGNOSIS — I4891 Unspecified atrial fibrillation: Secondary | ICD-10-CM | POA: Diagnosis not present

## 2019-02-23 DIAGNOSIS — N186 End stage renal disease: Secondary | ICD-10-CM

## 2019-02-23 DIAGNOSIS — G92 Toxic encephalopathy: Secondary | ICD-10-CM

## 2019-02-23 DIAGNOSIS — I10 Essential (primary) hypertension: Secondary | ICD-10-CM

## 2019-02-23 LAB — RENAL FUNCTION PANEL
Albumin: 3.1 g/dL — ABNORMAL LOW (ref 3.5–5.0)
Anion gap: 16 — ABNORMAL HIGH (ref 5–15)
BUN: 24 mg/dL — ABNORMAL HIGH (ref 8–23)
CO2: 26 mmol/L (ref 22–32)
Calcium: 9.1 mg/dL (ref 8.9–10.3)
Chloride: 95 mmol/L — ABNORMAL LOW (ref 98–111)
Creatinine, Ser: 4.15 mg/dL — ABNORMAL HIGH (ref 0.44–1.00)
GFR calc Af Amer: 12 mL/min — ABNORMAL LOW (ref >60)
GFR calc non Af Amer: 10 mL/min — ABNORMAL LOW (ref >60)
Glucose, Bld: 104 mg/dL — ABNORMAL HIGH (ref 70–99)
Phosphorus: 5 mg/dL — ABNORMAL HIGH (ref 2.5–4.6)
Potassium: 4.1 mmol/L (ref 3.5–5.1)
Sodium: 137 mmol/L (ref 135–145)

## 2019-02-23 LAB — LIPASE, BLOOD: Lipase: 40 U/L (ref 11–51)

## 2019-02-23 MED ORDER — LORAZEPAM 2 MG/ML IJ SOLN
0.5000 mg | Freq: Once | INTRAMUSCULAR | Status: AC
  Administered 2019-02-23: 0.5 mg via INTRAVENOUS
  Filled 2019-02-23: qty 1

## 2019-02-23 MED ORDER — LORAZEPAM 2 MG/ML IJ SOLN
1.0000 mg | Freq: Once | INTRAMUSCULAR | Status: AC
  Administered 2019-02-23: 1 mg via INTRAVENOUS
  Filled 2019-02-23: qty 1

## 2019-02-23 MED ORDER — GABAPENTIN 300 MG PO CAPS: 300.0000 mg | ORAL_CAPSULE | Freq: Every day | ORAL | Status: DC

## 2019-02-23 MED ORDER — GABAPENTIN 300 MG PO CAPS: 300.0000 mg | ORAL_CAPSULE | Freq: Every evening | ORAL | Status: DC | PRN

## 2019-02-23 NOTE — Plan of Care (Signed)
  Problem: Education: Goal: Knowledge of General Education information will improve Description: Including pain rating scale, medication(s)/side effects and non-pharmacologic comfort measures 02/23/2019 1426 by Don Perking, RN Outcome: Completed/Met 02/23/2019 0759 by Don Perking, RN Outcome: Progressing   Problem: Health Behavior/Discharge Planning: Goal: Ability to manage health-related needs will improve 02/23/2019 1426 by Don Perking, RN Outcome: Completed/Met 02/23/2019 0759 by Don Perking, RN Outcome: Progressing   Problem: Clinical Measurements: Goal: Ability to maintain clinical measurements within normal limits will improve 02/23/2019 1426 by Don Perking, RN Outcome: Completed/Met 02/23/2019 0759 by Don Perking, RN Outcome: Progressing Goal: Will remain free from infection 02/23/2019 1426 by Don Perking, RN Outcome: Completed/Met 02/23/2019 0759 by Don Perking, RN Outcome: Progressing Goal: Diagnostic test results will improve 02/23/2019 1426 by Don Perking, RN Outcome: Completed/Met 02/23/2019 0759 by Don Perking, RN Outcome: Progressing Goal: Respiratory complications will improve 02/23/2019 1426 by Don Perking, RN Outcome: Completed/Met 02/23/2019 0759 by Don Perking, RN Outcome: Progressing Goal: Cardiovascular complication will be avoided 02/23/2019 1426 by Don Perking, RN Outcome: Completed/Met 02/23/2019 0759 by Don Perking, RN Outcome: Progressing   Problem: Activity: Goal: Risk for activity intolerance will decrease 02/23/2019 1426 by Don Perking, RN Outcome: Completed/Met 02/23/2019 0759 by Don Perking, RN Outcome: Progressing   Problem: Nutrition: Goal: Adequate nutrition will be maintained 02/23/2019 1426 by Don Perking, RN Outcome: Completed/Met 02/23/2019 0759 by Don Perking, RN Outcome: Progressing   Problem: Coping: Goal: Level  of anxiety will decrease 02/23/2019 1426 by Don Perking, RN Outcome: Completed/Met 02/23/2019 0759 by Don Perking, RN Outcome: Progressing   Problem: Elimination: Goal: Will not experience complications related to bowel motility 02/23/2019 1426 by Don Perking, RN Outcome: Completed/Met 02/23/2019 0759 by Don Perking, RN Outcome: Progressing Goal: Will not experience complications related to urinary retention 02/23/2019 1426 by Don Perking, RN Outcome: Completed/Met 02/23/2019 0759 by Don Perking, RN Outcome: Progressing   Problem: Pain Managment: Goal: General experience of comfort will improve 02/23/2019 1426 by Don Perking, RN Outcome: Completed/Met 02/23/2019 0759 by Don Perking, RN Outcome: Progressing   Problem: Safety: Goal: Ability to remain free from injury will improve 02/23/2019 1426 by Don Perking, RN Outcome: Completed/Met 02/23/2019 0759 by Don Perking, RN Outcome: Progressing   Problem: Skin Integrity: Goal: Risk for impaired skin integrity will decrease 02/23/2019 1426 by Don Perking, RN Outcome: Completed/Met 02/23/2019 0759 by Don Perking, RN Outcome: Progressing

## 2019-02-23 NOTE — Progress Notes (Signed)
[facility] Progress Note   Subjective:   Patient seen and examined at bedside. Sleepy today.  Awakes and answers questions but goes right back to sleep.  When asked about dialysis yesterday said "it sucked but it is what it is, nothing we can do about it."  Denies n/v/d, CP and SOB.  Last night she was given Lorrin Mais, became confused and began acting out so was given Ativan x2 and has been sleepy all morning per nurse.   Objective Vitals:   02/23/19 0456 02/23/19 0714 02/23/19 0736 02/23/19 0800  BP:  (!) 161/65 (!) 166/71 (!) 146/62  Pulse:  73 77 75  Resp:  14 19 16   Temp: (!) 97.5 F (36.4 C) 97.6 F (36.4 C) 97.8 F (36.6 C)   TempSrc: Axillary Axillary Oral   SpO2:  100% 97% 97%  Weight:      Height:       Physical Exam General:NAD, chronically ill appearing female Heart:RRR, +1/9 systolic murmur Lungs:CTAB Abdomen:soft, NTND Extremities:no LE edema Dialysis Access: LU AVF +b, [facility]   Filed Weights   02/21/19 1625 02/21/19 1825 02/21/19 2105  Weight: 47.7 kg 48.4 kg 47 kg    Intake/Output Summary (Last 24 hours) at 02/23/2019 1018 Last data filed at 02/22/2019 2042 Gross per 24 hour  Intake 340 ml  Output 100 ml  Net 240 ml    Additional Objective Labs: Basic Metabolic Panel: Recent Labs  Lab 02/21/19 0834 02/22/19 1118 02/23/19 0808  NA 137  --  137  K 5.2*  --  4.1  CL 100  --  95*  CO2 21*  --  26  GLUCOSE 124*  --  104*  BUN 59*  --  24*  CREATININE 6.57*  --  4.15*  CALCIUM 9.2  --  9.1  PHOS  --  5.9* 5.0*   Liver Function Tests: Recent Labs  Lab 02/21/19 0834 02/23/19 0808  AST 18  --   ALT 12  --   ALKPHOS 67  --   BILITOT 1.0  --   PROT 7.3  --   ALBUMIN 3.3* 3.1*   Recent Labs  Lab 02/21/19 0834 02/23/19 0808  LIPASE 201* 40   CBC: Recent Labs  Lab 02/21/19 0834 02/21/19 1621  WBC 10.8* 8.3  NEUTROABS 8.1*  --   HGB 12.6 11.5*  HCT 39.1 35.5*  MCV 95.6 95.2  PLT 287 280   Blood Culture    Component  Value Date/Time   SDES URINE, RANDOM 02/21/2019 1440   SPECREQUEST  02/21/2019 1440    NONE Performed at [facility], [place] [address] [place], [place] [address]    CULT MULTIPLE SPECIES PRESENT, SUGGEST RECOLLECTION (A) 02/21/2019 1440   REPTSTATUS 02/22/2019 FINAL 02/21/2019 1440   Studies/Results: CT ABDOMEN PELVIS WO CONTRAST  Result Date: 02/21/2019 CLINICAL DATA:  Abdominal pain with nausea and vomiting. History of multiple small bowel obstructions. EXAM: CT ABDOMEN AND PELVIS WITHOUT CONTRAST TECHNIQUE: Multidetector CT imaging of the abdomen and pelvis was performed following the standard protocol without IV contrast. COMPARISON:  CT abdomen pelvis dated September 15, 2018. FINDINGS: Lower chest: No acute abnormality. Unchanged mild scarring at the lung bases. Hepatobiliary: No focal liver abnormality is seen. No gallstones, gallbladder wall thickening, or biliary dilatation. Pancreas: Unremarkable. No pancreatic ductal dilatation or surrounding inflammatory changes. Spleen: Normal in size without focal abnormality. Adrenals/Urinary Tract: Unchanged bilateral adrenal adenomas measuring 2.5 cm on the left and 2.2 cm on the right. Unchanged  bilateral renal atrophy, small cysts, and right greater than left renovascular calcifications. No definite renal calculi. No hydronephrosis. The bladder is unremarkable. Stomach/Bowel: Stomach is within normal limits. Prior appendectomy. Unchanged small bowel anastomosis in the left abdomen. No evidence of bowel wall thickening, distention, or inflammatory changes. Oral contrast reaches the sigmoid colon. Unchanged duodenal diverticulum. Vascular/Lymphatic: Aortic atherosclerosis. No enlarged abdominal or pelvic lymph nodes. Reproductive: Status post hysterectomy. No adnexal masses. Other: No free fluid or pneumoperitoneum. Unchanged small fat containing ventral supraumbilical hernias. Musculoskeletal: No acute or significant osseous findings. IMPRESSION: 1.  No acute intra-abdominal process. 2. Unchanged bilateral adrenal adenomas. 3.  Aortic atherosclerosis (ICD10-I70.0). Electronically Signed   By: Titus Dubin M.D.   On: 02/21/2019 11:40    Medications: . sodium chloride    . sodium chloride    . diltiazem (CARDIZEM) infusion 2.5 mg/hr (02/22/19 0815)   . allopurinol  100 mg Oral Daily  . aspirin EC  81 mg Oral Daily  . Chlorhexidine Gluconate Cloth  6 each Topical Q0600  . dicyclomine  20 mg Oral QHS  . ferric citrate  630 mg Oral TID WC   And  . ferric citrate  420 mg Oral With snacks  . furosemide  40 mg Oral Q T,Th,Sat-1800  . heparin injection (subcutaneous)  5,000 Units Subcutaneous Q8H  . isosorbide mononitrate  30 mg Oral Daily  . metoprolol tartrate  100 mg Oral BID  . mometasone-formoterol  2 puff Inhalation BID  . polyethylene glycol  17 g Oral QODAY  . pravastatin  80 mg Oral QPM  . senna-docusate  1 tablet Oral BID    Dialysis Orders: TTS -[place] 4hrs, BFR250, DFRAF1.5, EDW 47kg,2K/2Ca  Access:LUAVF, TDC Heparinnone Mircera22mcg q2wks - last 02/17/19 Venofer 50mg  qHD Calcitriol0.90mcg PO qHD   Assessment/Plan: 1. A fib w/RVR- improved on diltiazem. Per primary 2. Abdominal pain/n/v- Resolved.?pancreatitis, lipase 201. CT negative for SBO. Per primary. 3. ESRD- On HD TTS. extra HD Monday due to missed treatments, HD again yesterday on schedule.  Recorded in paper chart because float dialysis nurse did not have Epic access. Last K 4.1.  4. Hypertension/volume- BP improved but remains elevated. At home on amlodipine 10mg , metoprolol tartrate 100mg  BID and lasix 80mg  qd on non HD days. Does not appear volume overloaded on exam.   5. Anemiaof CKD- Hgb 11.5, no indication for ESA at this time 6. Secondary Hyperparathyroidism -Ca at goal. Phos 5.9. Continue binders and VDRA. 7. Nutrition- Renal diet w/fluid restrictions.  8. COPD 9. CAD s/p stents  10. Dispo - Ok to d/c from  a renal standpoint once improvement in mental status from sedating medications given overnight  Jen Mow, PA-C [facility] Pager: [phone] 02/23/2019,10:18 AM  LOS: 0 days

## 2019-02-23 NOTE — Plan of Care (Signed)

## 2019-02-23 NOTE — Discharge Instructions (Signed)

## 2019-02-23 NOTE — Progress Notes (Signed)
Patient showed increased signs of confusion after receiving Ambien. Patient began hallucinating, removing clothing. Patient became confused of location and situation. Patient could not be redirected as patient thought she was home, the doctor's or at her sister's home. Patient very impulsive attempting to leave believing she had her vehicle outside or her sister was waiting outside. Patient became very hard to control at patient did not understand commands and began stating no..   On call was paged twice for two separate orders.  Patient placed in arm/ankle restraints. Ativan given, but was ineffective. Second dose given. Will continue to monitor.

## 2019-02-23 NOTE — Progress Notes (Signed)
Renal Navigator met with patient at HD bedside to discuss issues she has been having getting to OP HD. Patient states there has been miscommunication with her [employer] in [place], and possibly an issue between her and one of the staff, but she states she has worked this out. She also states she has asked a family member to be her transportation to her OP HD clinic in a pinch if her transportation falls through. She states everything has been worked out at this time and there are no further issues. Renal Navigator suggests speaking with the Social Worker at her HD clinic in the future if she has resource needs. She states she hasn't, but agrees this is a good idea. She gives permission for Navigator to reach out to Education officer, museum to keep her in the loop with transportation issues.  Renal Navigator spoke with [facility] Social Worker who states that patient rides with [employer] and that patient had not notified them when she was in the hospital or when she had been discharged. She reports that it shouldn't be an issue for patient to set this back up at discharge.   Alphonzo Cruise, [place] Renal Navigator [phone]

## 2019-02-23 NOTE — Progress Notes (Signed)
CSW consulted regarding patient's delayed response time and concerns regarding going home. CSW spoke with day shift RN who reports patient oriented x4 and ambulatory independently in room, no needs. Patient had delayed responses after high doses of medications last night, however is now back to baseline.   No discharge needs identified at this time.   [place], [place]

## 2019-02-23 NOTE — Progress Notes (Signed)
PROGRESS NOTE  Christina [name] SHF:[id number] DOB: [birth year]   PCP: Maggie Schwalbe, PA-C  Patient is from: home  DOA: 02/21/2019 LOS: 0  Brief Narrative / Interim history: 69 year old F with history of ESRD on HD TTS, CAD/stent, GIB, COPD, SBO, HTN, HLD and depression presenting with abdominal pain in the setting of several missed HD treatments.  CT of abdomen and pelvis without acute finding to explain abdominal pain.  Lipase elevated to 200.  She was found to be in A. fib with RVR.  Started on Cardizem drip and transitioned to home oral metoprolol.   Patient received HD on 1/25 and 1/26.  However, she developed encephalopathy the night of 1/26-1/27 likely toxic from Ambien, Ativan and high-dose gabapentin  Subjective: Sleepy.  Briefly wakes to voice and falls back to asleep.  Oriented to self.  She thinks she is in [name].  She follows commands.  Does not appear to be in distress.   Objective: Vitals:   02/23/19 0714 02/23/19 0736 02/23/19 0800 02/23/19 1041  BP: (!) 161/65 (!) 166/71 (!) 146/62 (!) 143/71  Pulse: 73 77 75 79  Resp: 14 19 16 14   Temp: 97.6 F (36.4 C) 97.8 F (36.6 C)  98.1 F (36.7 C)  TempSrc: Axillary Oral  Oral  SpO2: 100% 97% 97% 98%  Weight:      Height:        Intake/Output Summary (Last 24 hours) at 02/23/2019 1359 Last data filed at 02/23/2019 1126 Gross per 24 hour  Intake 820 ml  Output -  Net 820 ml   Filed Weights   02/21/19 1625 02/21/19 1825 02/21/19 2105  Weight: 47.7 kg 48.4 kg 47 kg    Examination:  GENERAL: No apparent distress.  Sleepy. HEENT: MMM.  Vision and hearing grossly intact.  NECK: Supple.  No apparent JVD.  RESP:  No IWOB. Good air movement bilaterally. CVS:  RRR. Heart sounds normal.  ABD/GI/GU: Bowel sounds present. Soft. Non tender with distraction. MSK/EXT:  Moves extremities. No apparent deformity. No edema.  SKIN: no apparent skin lesion or wound NEURO: Sleepy but wakes to voice briefly.  Follows some  commands.  No focal neuro deficit. PSYCH: Sleepy.  No distress or agitation.  Procedures:  Hemodialysis on 1/25 on 1/26  Assessment & Plan: Acute toxic encephalopathy: Likely iatrogenic from Ambien, benzo and gabapentin.  Also on gabapentin and Atarax which could have contributed.  No focal neuro deficit.  No obvious signs of infection. -Discontinue sedating medications -Frequent reorientation and delirium precautions  Paroxysmal atrial fibrillation: Now rate controlled on home metoprolol.  Not on anticoagulation due to history of GI bleed.  Followed by cardiologist, Dr. Maia Petties in [place] home metoprolol  Abdominal pain: Seems to have resolved.  Likely due to fluid overload from missed dialysis.  Abdominal exam is benign.  Has mildly elevated lipase but no CT finding of pancreatitis or other acute finding to explain her pain.  ESRD on HD TTS: Reportedly missed 2.5 sessions due to lack of transportation prior to admission. -Nephrology managing-received HD on 1/25 and 1/26.  She is now back on schedule. -Continue home Lasix.  Bone mineral disorder: -Per nephrology.  CAD s/p stent placement about 20 years ago -Continue home metoprolol, Imdur, statin and aspirin  Essential hypertension: Normotensive -Cardiac meds as above  Chronic COPD: Stable. Ruthe Mannan and albuterol.               DVT prophylaxis: Subcu heparin Code Status: DNR/DNI Family Communication: Patient and/or  RN. Available if any question.  Disposition Plan: Remains inpatient due to acute encephalopathy. Consultants: Nephrology   Microbiology summarized: [id number] and influenza PCR negative. Urine culture with multiple species.  Sch Meds:  Scheduled Meds: . allopurinol  100 mg Oral Daily  . aspirin EC  81 mg Oral Daily  . Chlorhexidine Gluconate Cloth  6 each Topical Q0600  . dicyclomine  20 mg Oral QHS  . ferric citrate  630 mg Oral TID WC   And  . ferric citrate  420 mg Oral With snacks  .  furosemide  40 mg Oral Q T,Th,Sat-1800  . heparin injection (subcutaneous)  5,000 Units Subcutaneous Q8H  . isosorbide mononitrate  30 mg Oral Daily  . metoprolol tartrate  100 mg Oral BID  . mometasone-formoterol  2 puff Inhalation BID  . polyethylene glycol  17 g Oral QODAY  . pravastatin  80 mg Oral QPM  . senna-docusate  1 tablet Oral BID   Continuous Infusions: . sodium chloride    . sodium chloride    . diltiazem (CARDIZEM) infusion 2.5 mg/hr (02/22/19 0815)   PRN Meds:.sodium chloride, sodium chloride, acetaminophen, albuterol, alteplase, calcium carbonate (dosed in mg elemental calcium), docusate sodium, doxylamine (Sleep), feeding supplement (NEPRO CARB STEADY), heparin, hydroxypropyl methylcellulose / hypromellose, ipratropium-albuterol, lidocaine (PF), lidocaine-prilocaine, ondansetron (ZOFRAN) IV, pentafluoroprop-tetrafluoroeth, sorbitol  Antimicrobials: Anti-infectives (From admission, onward)   None       I have personally reviewed the following labs and images: CBC: Recent Labs  Lab 02/21/19 0834 02/21/19 1621  WBC 10.8* 8.3  NEUTROABS 8.1*  --   HGB 12.6 11.5*  HCT 39.1 35.5*  MCV 95.6 95.2  PLT 287 280   BMP &GFR Recent Labs  Lab 02/21/19 0834 02/22/19 1118 02/23/19 0808  NA 137  --  137  K 5.2*  --  4.1  CL 100  --  95*  CO2 21*  --  26  GLUCOSE 124*  --  104*  BUN 59*  --  24*  CREATININE 6.57*  --  4.15*  CALCIUM 9.2  --  9.1  MG 2.0  --   --   PHOS  --  5.9* 5.0*   Estimated Creatinine Clearance: 9.6 mL/min (A) (by C-G formula based on SCr of 4.15 mg/dL (H)). Liver & Pancreas: Recent Labs  Lab 02/21/19 0834 02/23/19 0808  AST 18  --   ALT 12  --   ALKPHOS 67  --   BILITOT 1.0  --   PROT 7.3  --   ALBUMIN 3.3* 3.1*   Recent Labs  Lab 02/21/19 0834 02/23/19 0808  LIPASE 201* 40   No results for input(s): AMMONIA in the last 168 hours. Diabetic: No results for input(s): HGBA1C in the last 72 hours. No results for input(s):  GLUCAP in the last 168 hours. Cardiac Enzymes: No results for input(s): CKTOTAL, CKMB, CKMBINDEX, TROPONINI in the last 168 hours. No results for input(s): PROBNP in the last 8760 hours. Coagulation Profile: No results for input(s): INR, PROTIME in the last 168 hours. Thyroid Function Tests: No results for input(s): TSH, T4TOTAL, FREET4, T3FREE, THYROIDAB in the last 72 hours. Lipid Profile: No results for input(s): CHOL, HDL, LDLCALC, TRIG, CHOLHDL, LDLDIRECT in the last 72 hours. Anemia Panel: No results for input(s): VITAMINB12, FOLATE, FERRITIN, TIBC, IRON, RETICCTPCT in the last 72 hours. Urine analysis:    Component Value Date/Time   COLORURINE YELLOW 02/21/2019 [place] 02/21/2019 0754   LABSPEC 1.006 02/21/2019 0754  PHURINE 7.0 02/21/2019 [place] 02/21/2019 [place] 02/21/2019 [place] 02/21/2019 [place] 02/21/2019 0754   PROTEINUR 100 (A) 02/21/2019 0754   NITRITE NEGATIVE 02/21/2019 0754   LEUKOCYTESUR NEGATIVE 02/21/2019 0754   Sepsis Labs: Invalid input(s): PROCALCITONIN, [place]  Microbiology: Recent Results (from the past 240 hour(s))  Respiratory Panel by RT PCR (Flu A&B, Covid) - Nasopharyngeal Swab     Status: None   Collection Time: 02/21/19 12:36 PM   Specimen: Nasopharyngeal Swab  Result Value Ref Range Status   SARS Coronavirus 2 by RT PCR NEGATIVE NEGATIVE Final    Comment: (NOTE) SARS-CoV-2 target nucleic acids are NOT DETECTED. The SARS-CoV-2 RNA is generally detectable in upper respiratoy specimens during the acute phase of infection. The lowest concentration of SARS-CoV-2 viral copies this assay can detect is 131 copies/mL. A negative result does not preclude SARS-Cov-2 infection and should not be used as the sole basis for treatment or other patient management decisions. A negative result may occur with  improper specimen collection/handling, submission of  specimen other than nasopharyngeal swab, presence of viral mutation(s) within the areas targeted by this assay, and inadequate number of viral copies (<131 copies/mL). A negative result must be combined with clinical observations, patient history, and epidemiological information. The expected result is Negative. Fact Sheet for Patients:  PinkCheek.be Fact Sheet for Healthcare Providers:  GravelBags.it This test is not yet ap proved or cleared by the [place] FDA and  has been authorized for detection and/or diagnosis of SARS-CoV-2 by FDA under an Emergency Use Authorization (EUA). This EUA will remain  in effect (meaning this test can be used) for the duration of the COVID-19 declaration under Section 564(b)(1) of the Act, 21 U.S.C. section 360bbb-3(b)(1), unless the authorization is terminated or revoked sooner.    Influenza A by PCR NEGATIVE NEGATIVE Final   Influenza B by PCR NEGATIVE NEGATIVE Final    Comment: (NOTE) The Xpert Xpress SARS-CoV-2/FLU/RSV assay is intended as an aid in  the diagnosis of influenza from Nasopharyngeal swab specimens and  should not be used as a sole basis for treatment. Nasal washings and  aspirates are unacceptable for Xpert Xpress SARS-CoV-2/FLU/RSV  testing. Fact Sheet for Patients: PinkCheek.be Fact Sheet for Healthcare Providers: GravelBags.it This test is not yet approved or cleared by the [place] FDA and  has been authorized for detection and/or diagnosis of SARS-CoV-2 by  FDA under an Emergency Use Authorization (EUA). This EUA will remain  in effect (meaning this test can be used) for the duration of the  Covid-19 declaration under Section 564(b)(1) of the Act, 21  U.S.C. section 360bbb-3(b)(1), unless the authorization is  terminated or revoked. Performed at [facility], [place] [address], [place], [place]  [address]   Urine culture     Status: Abnormal   Collection Time: 02/21/19  2:40 PM   Specimen: Urine, Random  Result Value Ref Range Status   Specimen Description URINE, RANDOM  Final   Special Requests   Final    NONE Performed at [facility], [place] [address] [place], [place] [address]    Culture MULTIPLE SPECIES PRESENT, SUGGEST RECOLLECTION (A)  Final   Report Status 02/22/2019 FINAL  Final    Radiology Studies: No results found.    Taye T. [place]  If 7PM-7AM, please contact night-coverage [email or web address] Password [facility] 02/23/2019, 1:59 PM

## 2019-02-23 NOTE — Progress Notes (Signed)
[place] regarding pt's does not have a ride home. Daughter unable to come get her.  Explained and discussed discharge instructions with patient, no new medication, doctor decreased neurotin dose for bedtime. Pt will be going home via taxi with belongings. Notified daugther Joseph Art regarding mother being discharged.

## 2019-02-23 NOTE — Discharge Summary (Signed)
Physician Discharge Summary  Christina [name] MLY:[id number] DOB: [birth year] DOA: 02/21/2019  PCP: Maggie Schwalbe, PA-C  Admit date: 02/21/2019 Discharge date: 02/23/2019  Admitted From: Home Disposition: Home  Recommendations for Outpatient Follow-up:  1. Follow ups as below. 2. Please obtain CBC/BMP/Mag at follow up 3. Please follow up on the following pending results: None  Home Health: None required Equipment/Devices: None required  Discharge Condition: Stable CODE STATUS: DNR/DNI   Hospital Course: 69 year old F with history of ESRD on HD TTS, CAD/stent, GIB, COPD, SBO, HTN, HLD and depression presenting with abdominal pain in the setting of several missed HD treatments reportedly due to lack of transportation. CT of abdomen and pelvis without acute finding to explain abdominal pain.  Lipase elevated to 200.  She was found to be in A. fib with RVR.  Started on Cardizem drip and transitioned to home oral metoprolol.   Patient received HD on 1/25 and 1/26.  However, she developed encephalopathy the night of 1/26-1/27 likely toxic from Ambien, Ativan and high-dose gabapentin.  Encephalopathy eventually resolved.  She was ambulating in the room ready to go home when I went back to check on her prior to discharge.   See individual problem list below for more hospital course. Discharge Diagnoses:  Acute toxic encephalopathy: Likely iatrogenic from Ambien, benzo and gabapentin. No focal neuro deficit.  No obvious signs of infection.  Encephalopathy resolved prior to discharge.  Ambulating in the room without problem.  Reduced home gabapentin to 300 mg nightly.  None of benzo or Ambien at home.  Paroxysmal atrial fibrillation: Now rate controlled on home metoprolol.  Not on anticoagulation due to history of GI bleed.  Followed by cardiologist, Dr. Maia Petties in [place] home metoprolol XL 100 mg daily  Abdominal pain: resolved.  Likely due to fluid overload from missed  dialysis.  Abdominal exam is benign.  Has mildly elevated lipase but no CT finding of pancreatitis or other acute finding to explain her pain.  ESRD on HD TTS: Reportedly missed 2.5 sessions due to lack of transportation prior to admission. -Nephrology managing-received HD on 1/25 and 1/26.  She is now back on schedule. -Per renal navigator, "Renal Navigator spoke with [facility] Social Worker who states that patient rides with [employer] and that patient had not notified them when she was in the hospital or when she had been discharged. She reports that it shouldn't be an issue for patient to set this back up at discharge".   Bone mineral disorder: -Per nephrology.  CAD s/p stent placement about 20 years ago -Continue home metoprolol, Imdur, statin and aspirin  Essential hypertension: Normotensive -Cardiac meds as above  Chronic COPD: Stable. -Continue home Symbicort and DuoNeb.  Discharge Instructions  Discharge Instructions    Amb referral to [facility]   Complete by: As directed    Call MD for:  difficulty breathing, headache or visual disturbances   Complete by: As directed    Call MD for:  extreme fatigue   Complete by: As directed    Call MD for:  persistant dizziness or light-headedness   Complete by: As directed    Call MD for:  persistant nausea and vomiting   Complete by: As directed    Call MD for:  temperature >100.4   Complete by: As directed    Diet - low sodium heart healthy   Complete by: As directed    Discharge instructions   Complete by: As directed    It  has been a pleasure taking care of you! You were hospitalized with abdominal pain and atrial fibrillation (irregular and fast heart rate) likely from use of dialysis.  You were treated with medications and dialysis.  With that, your symptoms improved to the point we think it is safe to let you go home.  It is very important that you go to your dialysis as scheduled. Please review  your new medication list and the directions before you take your medications.   Take care,   Increase activity slowly   Complete by: As directed      Allergies as of 02/23/2019      Reactions   Cyclobenzaprine Anaphylaxis, Other (See Comments)   "stopped heart"   Morphine Anaphylaxis   "stopped heart"   Penicillins Shortness Of Breath, Swelling, Palpitations   Did it involve swelling of the face/tongue/throat, SOB, or low BP? Yes Did it involve sudden or severe rash/hives, skin peeling, or any reaction on the inside of your mouth or nose? No Did you need to seek medical attention at a hospital or doctor's office? No When did it last happen?years  If all above answers are "NO", may proceed with cephalosporin use.   Codeine Itching, Rash   Hydromorphone Other (See Comments)   If administered quickly, felt like hand was "exploding"       Medication List    TAKE these medications   albuterol (2.5 MG/3ML) 0.083% nebulizer solution Commonly known as: PROVENTIL Take 2.5 mg by nebulization every 6 (six) hours as needed for wheezing or shortness of breath.   allopurinol 100 MG tablet Commonly known as: ZYLOPRIM Take 100 mg by mouth daily.   amLODipine 10 MG tablet Commonly known as: NORVASC Take 10 mg by mouth daily.   aspirin EC 81 MG tablet Take 81 mg by mouth daily.   Auryxia 1 GM 210 MG(Fe) tablet Generic drug: ferric citrate Take 420 mg by mouth See admin instructions. Take 3 tablets (630 mg) by mouth with each meal & take 2 tablets (420 mg) by mouth with each snack   dicyclomine 10 MG capsule Commonly known as: BENTYL Take 20 mg by mouth at bedtime.   docusate sodium 100 MG capsule Commonly known as: COLACE Take 1 capsule (100 mg total) by mouth 2 (two) times daily.   doxylamine (Sleep) 25 MG tablet Commonly known as: UNISOM Take 25 mg by mouth at bedtime as needed for sleep.   furosemide 80 MG tablet Commonly known as: LASIX Take 80 mg by mouth See  admin instructions. Take one tablet (40 mg totally) by mouth on TUE THUR SAT before dialysis.   gabapentin 300 MG capsule Commonly known as: NEURONTIN Take 1 capsule (300 mg total) by mouth at bedtime. What changed:   how much to take  when to take this  reasons to take this   Ipratropium-Albuterol 20-100 MCG/ACT Aers respimat Commonly known as: COMBIVENT Inhale 2 puffs into the lungs 4 (four) times daily as needed for wheezing.   isosorbide mononitrate 30 MG 24 hr tablet Commonly known as: IMDUR Take 30 mg by mouth daily.   lidocaine-prilocaine cream Commonly known as: EMLA Apply 1 application topically daily as needed (1-2 hours before dialysis (cover with occlusive dressing)).   LUBRICATING EYE DROPS OP Place 1 drop into both eyes daily as needed (dry eyes).   metoprolol tartrate 100 MG tablet Commonly known as: LOPRESSOR Take 1 tablet (100 mg total) by mouth 2 (two) times daily.   nitroGLYCERIN 0.4 MG  SL tablet Commonly known as: NITROSTAT Place 0.4 mg under the tongue every 5 (five) minutes as needed for chest pain.   ondansetron 4 MG tablet Commonly known as: ZOFRAN Take 4 mg by mouth 3 (three) times daily as needed for nausea.   pantoprazole 40 MG tablet Commonly known as: PROTONIX Take 1 tablet (40 mg total) by mouth daily. What changed:   when to take this  reasons to take this   polyethylene glycol 17 g packet Commonly known as: MIRALAX / GLYCOLAX Take 17 g by mouth 2 (two) times daily. What changed: when to take this   pravastatin 80 MG tablet Commonly known as: PRAVACHOL Take 80 mg by mouth every evening.   sennosides-docusate sodium 8.6-50 MG tablet Commonly known as: SENOKOT-S Take 1 tablet by mouth 2 (two) times daily.   Symbicort 160-4.5 MCG/ACT inhaler Generic drug: budesonide-formoterol Inhale 2 puffs into the lungs every 4 (four) hours as needed (for wheezing).   Vitamin D3 50 MCG (2000 UT) capsule Take 2,000 Units by mouth  daily.       Consultations:  Nephrology  Procedures/Studies:   CT ABDOMEN PELVIS WO CONTRAST  Result Date: 02/21/2019 CLINICAL DATA:  Abdominal pain with nausea and vomiting. History of multiple small bowel obstructions. EXAM: CT ABDOMEN AND PELVIS WITHOUT CONTRAST TECHNIQUE: Multidetector CT imaging of the abdomen and pelvis was performed following the standard protocol without IV contrast. COMPARISON:  CT abdomen pelvis dated September 15, 2018. FINDINGS: Lower chest: No acute abnormality. Unchanged mild scarring at the lung bases. Hepatobiliary: No focal liver abnormality is seen. No gallstones, gallbladder wall thickening, or biliary dilatation. Pancreas: Unremarkable. No pancreatic ductal dilatation or surrounding inflammatory changes. Spleen: Normal in size without focal abnormality. Adrenals/Urinary Tract: Unchanged bilateral adrenal adenomas measuring 2.5 cm on the left and 2.2 cm on the right. Unchanged bilateral renal atrophy, small cysts, and right greater than left renovascular calcifications. No definite renal calculi. No hydronephrosis. The bladder is unremarkable. Stomach/Bowel: Stomach is within normal limits. Prior appendectomy. Unchanged small bowel anastomosis in the left abdomen. No evidence of bowel wall thickening, distention, or inflammatory changes. Oral contrast reaches the sigmoid colon. Unchanged duodenal diverticulum. Vascular/Lymphatic: Aortic atherosclerosis. No enlarged abdominal or pelvic lymph nodes. Reproductive: Status post hysterectomy. No adnexal masses. Other: No free fluid or pneumoperitoneum. Unchanged small fat containing ventral supraumbilical hernias. Musculoskeletal: No acute or significant osseous findings. IMPRESSION: 1. No acute intra-abdominal process. 2. Unchanged bilateral adrenal adenomas. 3.  Aortic atherosclerosis (ICD10-I70.0). Electronically Signed   By: Titus Dubin M.D.   On: 02/21/2019 11:40   DG Chest 2 View  Result Date:  01/24/2019 CLINICAL DATA:  Shortness of breath EXAM: CHEST - 2 VIEW COMPARISON:  10/13/2018 FINDINGS: Right-sided central venous catheter tip at the cavoatrial junction. Trace bilateral pleural effusions. Pulmonary hyperinflation. Mild diffuse bilateral interstitial and ground-glass opacity. Stable cardiomediastinal silhouette with aortic atherosclerosis. No pneumothorax. IMPRESSION: Trace bilateral pleural effusions with mild diffuse interstitial and ground-glass opacity, suspect for mild edema. Electronically Signed   By: Donavan Foil M.D.   On: 01/24/2019 22:59   DG Chest Portable 1 View  Result Date: 02/21/2019 CLINICAL DATA:  Tachycardia EXAM: PORTABLE CHEST 1 VIEW COMPARISON:  February 05, 2019 FINDINGS: Endotracheal tube and nasogastric tube have been removed. Central catheter tip is in the superior vena cava near the cavoatrial junction, unchanged. There is mild atelectatic change in the left mid lung and left base. Lungs elsewhere are clear. Heart is upper normal in size with pulmonary vascularity normal.  No adenopathy. There is aortic atherosclerosis. No bone lesions. IMPRESSION: Areas of mild atelectatic change on the left. Lungs elsewhere clear. Stable cardiac silhouette. No adenopathy. Central catheter tip in superior vena cava near cavoatrial junction, stable. Aortic Atherosclerosis (ICD10-I70.0). Electronically Signed   By: Lowella Grip III M.D.   On: 02/21/2019 08:10   DG Chest Port 1 View  Result Date: 02/05/2019 CLINICAL DATA:  Respiratory failure. EXAM: PORTABLE CHEST 1 VIEW COMPARISON:  02/04/2019 FINDINGS: Endotracheal tube terminates 2 cm above the carina. Right jugular dialysis catheter terminates near the superior cavoatrial junction. Enteric tube courses into the abdomen with side hole below the diaphragm and tip not imaged. The cardiac silhouette is borderline to mildly enlarged. Right greater than left perihilar and mild diffuse interstitial opacities on the prior study have  improved. There may be small bilateral pleural effusions. No pneumothorax is identified. IMPRESSION: 1. Support devices as above. 2. Improved appearance of the lungs consistent with decreased edema. Electronically Signed   By: Logan Bores M.D.   On: 02/05/2019 09:03   DG Chest Portable 1 View  Result Date: 02/04/2019 CLINICAL DATA:  69 year old female status post intubation. EXAM: PORTABLE CHEST 1 VIEW COMPARISON:  Chest radiograph dated 01/24/2019. FINDINGS: Right-sided dialysis catheter in stable position. Endotracheal tube with tip approximately 4 cm above the carina and enteric tube extending inferior to the diaphragm with tip beyond the inferior margin of the image. There is mild cardiomegaly with probable vascular congestion and mild edema. Developing pneumonia is not excluded. Clinical correlation is recommended. A small left pleural effusion may be present. No pneumothorax. Atherosclerotic calcification of the aorta. No acute osseous pathology. IMPRESSION: 1. Endotracheal tube above the carina and enteric tube with tip beyond the inferior margin of the image. 2. Cardiomegaly with mild edema. Developing pneumonia is not excluded. Electronically Signed   By: Anner Crete M.D.   On: 02/04/2019 01:32   ECHOCARDIOGRAM COMPLETE  Result Date: 01/25/2019   ECHOCARDIOGRAM REPORT   Patient Name:   Christina [name] Date of Exam: 01/25/2019 Medical Rec #:  [id number]          Height:       61.0 in Accession #:    [id number]         Weight:       104.0 lb Date of Birth:  04/17/1950           BSA:          1.43 m Patient Age:    40 years           BP:           138/66 mmHg Patient Gender: F                  HR:           74 bpm. Exam Location:  Inpatient Procedure: 2D Echo, Color Doppler and Cardiac Doppler Indications:    R94.31 Abnormal EKG  History:        Patient has no prior history of Echocardiogram examinations.                 CAD, COPD, Arrythmias:Atrial Fibrillation; Risk                  Factors:Hypertension and Dyslipidemia. ESRD.  Sonographer:    Raquel Sarna Senior RDCS Referring Phys: [place]  1. Left ventricular ejection fraction, by visual estimation, is 45 to 50%. The left ventricle has mildly decreased function.  Left ventricular septal wall thickness was mildly increased. There is no left ventricular hypertrophy.  2. Left ventricular diastolic parameters are consistent with Grade I diastolic dysfunction (impaired relaxation).  3. The left ventricle demonstrates global hypokinesis.  4. Elevated LVEDP.  5. Global right ventricle has normal systolic function.The right ventricular size is normal. No increase in right ventricular wall thickness.  6. Left atrial size was moderately dilated.  7. Right atrial size was normal.  8. Mild mitral annular calcification.  9. The mitral valve is normal in structure. Mild to moderate mitral valve regurgitation. No evidence of mitral stenosis. 10. The tricuspid valve is normal in structure. 11. The aortic valve is normal in structure. Aortic valve regurgitation is mild. No evidence of aortic valve sclerosis or stenosis. 12. The pulmonic valve was normal in structure. Pulmonic valve regurgitation is not visualized. 13. Mildly elevated pulmonary artery systolic pressure. 14. The inferior vena cava is normal in size with greater than 50% respiratory variability, suggesting right atrial pressure of 3 mmHg. FINDINGS  Left Ventricle: Left ventricular ejection fraction, by visual estimation, is 45 to 50%. The left ventricle has mildly decreased function. The left ventricle demonstrates global hypokinesis. There is no left ventricular hypertrophy. Left ventricular diastolic parameters are consistent with Grade I diastolic dysfunction (impaired relaxation). Normal left atrial pressure. Elevated LVEDP. Right Ventricle: The right ventricular size is normal. No increase in right ventricular wall thickness. Global RV systolic function is has normal  systolic function. The tricuspid regurgitant velocity is 2.85 m/s, and with an assumed right atrial pressure  of 3 mmHg, the estimated right ventricular systolic pressure is mildly elevated at 35.5 mmHg. Left Atrium: Left atrial size was moderately dilated. Right Atrium: Right atrial size was normal in size Pericardium: There is no evidence of pericardial effusion. Mitral Valve: The mitral valve is normal in structure. There is mild thickening of the mitral valve leaflet(s). Mild mitral annular calcification. Mild to moderate mitral valve regurgitation. No evidence of mitral valve stenosis by observation. Tricuspid Valve: The tricuspid valve is normal in structure. Tricuspid valve regurgitation is mild. Aortic Valve: The aortic valve is normal in structure. Aortic valve regurgitation is mild. The aortic valve is structurally normal, with no evidence of sclerosis or stenosis. Pulmonic Valve: The pulmonic valve was normal in structure. Pulmonic valve regurgitation is not visualized. Pulmonic regurgitation is not visualized. Aorta: The aortic root, ascending aorta and aortic arch are all structurally normal, with no evidence of dilitation or obstruction. Venous: The right upper and right lower pulmonary veins are and the left upper and right lower pulmonary veins are normal. The inferior vena cava is normal in size with greater than 50% respiratory variability, suggesting right atrial pressure of 3 mmHg. IAS/Shunts: No atrial level shunt detected by color flow Doppler. There is no evidence of a patent foramen ovale. No ventricular septal defect is seen or detected. There is no evidence of an atrial septal defect.  LEFT VENTRICLE PLAX 2D LVIDd:         4.10 cm       Diastology LVIDs:         3.20 cm       LV e' lateral:   7.94 cm/s LV PW:         1.00 cm       LV E/e' lateral: 14.4 LV IVS:        1.00 cm       LV e' medial:    6.85 cm/s LVOT diam:  1.60 cm       LV E/e' medial:  16.6 LV SV:         33 ml LV SV Index:    23.37 LVOT Area:     2.01 cm  LV Volumes (MOD) LV area d, A2C:    29.70 cm LV area d, A4C:    28.50 cm LV area s, A2C:    19.20 cm LV area s, A4C:    20.20 cm LV major d, A2C:   8.52 cm LV major d, A4C:   8.13 cm LV major s, A2C:   7.45 cm LV major s, A4C:   7.51 cm LV vol d, MOD A2C: 86.0 ml LV vol d, MOD A4C: 80.5 ml LV vol s, MOD A2C: 43.3 ml LV vol s, MOD A4C: 46.8 ml LV SV MOD A2C:     42.7 ml LV SV MOD A4C:     80.5 ml LV SV MOD BP:      40.1 ml RIGHT VENTRICLE RV S prime:     10.00 cm/s TAPSE (M-mode): 1.7 cm LEFT ATRIUM             Index       RIGHT ATRIUM           Index LA diam:        4.20 cm 2.94 cm/m  RA Area:     13.70 cm LA Vol (A2C):   55.0 ml 38.45 ml/m RA Volume:   32.50 ml  22.72 ml/m LA Vol (A4C):   49.9 ml 34.88 ml/m LA Biplane Vol: 54.2 ml 37.89 ml/m  AORTIC VALVE LVOT Vmax:   130.00 cm/s LVOT Vmean:  86.000 cm/s LVOT VTI:    0.298 m  AORTA Ao Root diam: 2.60 cm Ao Asc diam:  2.60 cm MITRAL VALVE                         TRICUSPID VALVE MV Area (PHT): 3.37 cm              TR Peak grad:   32.5 mmHg MV PHT:        65.25 msec            TR Vmax:        285.00 cm/s MV Decel Time: 225 msec MV E velocity: 114.00 cm/s 103 cm/s  SHUNTS MV A velocity: 123.00 cm/s 70.3 cm/s Systemic VTI:  0.30 m MV E/A ratio:  0.93        1.5       Systemic Diam: 1.60 cm  Skeet Latch MD Electronically signed by Skeet Latch MD Signature Date/Time: 01/25/2019/2:15:04 PM    Final        Discharge Exam: Vitals:   02/23/19 0800 02/23/19 1041  BP: (!) 146/62 (!) 143/71  Pulse: 75 79  Resp: 16 14  Temp:  98.1 F (36.7 C)  SpO2: 97% 98%    GENERAL: No acute distress.  Appears well.  HEENT: MMM.  Vision and hearing grossly intact.  NECK: Supple.  No apparent JVD.  RESP:  No IWOB. Good air movement bilaterally. CVS:  RRR. Heart sounds normal.  ABD/GI/GU: Bowel sounds present. Soft. Non tender.  MSK/EXT:  Moves extremities. No apparent deformity or edema.  SKIN: no apparent skin lesion  or wound NEURO: Awake, alert and oriented appropriately.  Ambulate in the room.  Steady on her feet. PSYCH: Calm. Normal affect.  The results of significant diagnostics from this  hospitalization (including imaging, microbiology, ancillary and laboratory) are listed below for reference.     Microbiology: Recent Results (from the past 240 hour(s))  Respiratory Panel by RT PCR (Flu A&B, Covid) - Nasopharyngeal Swab     Status: None   Collection Time: 02/21/19 12:36 PM   Specimen: Nasopharyngeal Swab  Result Value Ref Range Status   SARS Coronavirus 2 by RT PCR NEGATIVE NEGATIVE Final    Comment: (NOTE) SARS-CoV-2 target nucleic acids are NOT DETECTED. The SARS-CoV-2 RNA is generally detectable in upper respiratoy specimens during the acute phase of infection. The lowest concentration of SARS-CoV-2 viral copies this assay can detect is 131 copies/mL. A negative result does not preclude SARS-Cov-2 infection and should not be used as the sole basis for treatment or other patient management decisions. A negative result may occur with  improper specimen collection/handling, submission of specimen other than nasopharyngeal swab, presence of viral mutation(s) within the areas targeted by this assay, and inadequate number of viral copies (<131 copies/mL). A negative result must be combined with clinical observations, patient history, and epidemiological information. The expected result is Negative. Fact Sheet for Patients:  PinkCheek.be Fact Sheet for Healthcare Providers:  GravelBags.it This test is not yet ap proved or cleared by the [place] FDA and  has been authorized for detection and/or diagnosis of SARS-CoV-2 by FDA under an Emergency Use Authorization (EUA). This EUA will remain  in effect (meaning this test can be used) for the duration of the COVID-19 declaration under Section 564(b)(1) of the Act, 21 U.S.C. section  360bbb-3(b)(1), unless the authorization is terminated or revoked sooner.    Influenza A by PCR NEGATIVE NEGATIVE Final   Influenza B by PCR NEGATIVE NEGATIVE Final    Comment: (NOTE) The Xpert Xpress SARS-CoV-2/FLU/RSV assay is intended as an aid in  the diagnosis of influenza from Nasopharyngeal swab specimens and  should not be used as a sole basis for treatment. Nasal washings and  aspirates are unacceptable for Xpert Xpress SARS-CoV-2/FLU/RSV  testing. Fact Sheet for Patients: PinkCheek.be Fact Sheet for Healthcare Providers: GravelBags.it This test is not yet approved or cleared by the [place] FDA and  has been authorized for detection and/or diagnosis of SARS-CoV-2 by  FDA under an Emergency Use Authorization (EUA). This EUA will remain  in effect (meaning this test can be used) for the duration of the  Covid-19 declaration under Section 564(b)(1) of the Act, 21  U.S.C. section 360bbb-3(b)(1), unless the authorization is  terminated or revoked. Performed at [facility], [place] [address] [place], [place] [address]   Urine culture     Status: Abnormal   Collection Time: 02/21/19  2:40 PM   Specimen: Urine, Random  Result Value Ref Range Status   Specimen Description URINE, RANDOM  Final   Special Requests   Final    NONE Performed at [facility], [place] [address], [place], [place] [address]    Culture MULTIPLE SPECIES PRESENT, SUGGEST RECOLLECTION (A)  Final   Report Status 02/22/2019 FINAL  Final     Labs: BNP (last 3 results) Recent Labs    01/24/19 2228 02/04/19 0103  BNP 1,570.0* 8,016.5*   Basic Metabolic Panel: Recent Labs  Lab 02/21/19 0834 02/22/19 1118 02/23/19 0808  NA 137  --  137  K 5.2*  --  4.1  CL 100  --  95*  CO2 21*  --  26  GLUCOSE 124*  --  104*  BUN 59*  --  24*  CREATININE 6.57*  --  4.15*  CALCIUM 9.2  --  9.1  MG 2.0  --   --   PHOS  --  5.9* 5.0*    Liver Function Tests: Recent Labs  Lab 02/21/19 0834 02/23/19 0808  AST 18  --   ALT 12  --   ALKPHOS 67  --   BILITOT 1.0  --   PROT 7.3  --   ALBUMIN 3.3* 3.1*   Recent Labs  Lab 02/21/19 0834 02/23/19 0808  LIPASE 201* 40   No results for input(s): AMMONIA in the last 168 hours. CBC: Recent Labs  Lab 02/21/19 0834 02/21/19 1621  WBC 10.8* 8.3  NEUTROABS 8.1*  --   HGB 12.6 11.5*  HCT 39.1 35.5*  MCV 95.6 95.2  PLT 287 280   Cardiac Enzymes: No results for input(s): CKTOTAL, CKMB, CKMBINDEX, TROPONINI in the last 168 hours. BNP: Invalid input(s): POCBNP CBG: No results for input(s): GLUCAP in the last 168 hours. D-Dimer No results for input(s): DDIMER in the last 72 hours. Hgb A1c No results for input(s): HGBA1C in the last 72 hours. Lipid Profile No results for input(s): CHOL, HDL, LDLCALC, TRIG, CHOLHDL, LDLDIRECT in the last 72 hours. Thyroid function studies No results for input(s): TSH, T4TOTAL, T3FREE, THYROIDAB in the last 72 hours.  Invalid input(s): FREET3 Anemia work up No results for input(s): VITAMINB12, FOLATE, FERRITIN, TIBC, IRON, RETICCTPCT in the last 72 hours. Urinalysis    Component Value Date/Time   COLORURINE YELLOW 02/21/2019 0754   APPEARANCEUR CLEAR 02/21/2019 0754   LABSPEC 1.006 02/21/2019 0754   PHURINE 7.0 02/21/2019 0754   GLUCOSEU NEGATIVE 02/21/2019 0754   HGBUR NEGATIVE 02/21/2019 0754   [place] 02/21/2019 0754   KETONESUR NEGATIVE 02/21/2019 0754   PROTEINUR 100 (A) 02/21/2019 0754   NITRITE NEGATIVE 02/21/2019 0754   LEUKOCYTESUR NEGATIVE 02/21/2019 0754   Sepsis Labs Invalid input(s): PROCALCITONIN,  WBC,  LACTICIDVEN   Time coordinating discharge: 35 minutes  SIGNED:  Mercy Riding, MD  [facility] 02/23/2019, 2:23 PM  If 7PM-7AM, please contact night-coverage [email or web address] Password [email or web address]

## 2019-02-27 ENCOUNTER — Other Ambulatory Visit: Payer: Self-pay

## 2019-02-27 ENCOUNTER — Inpatient Hospital Stay (HOSPITAL_COMMUNITY)
Admission: EM | Admit: 2019-02-27 | Discharge: 2019-03-01 | DRG: 682 | Disposition: A | Discharge status: Home / Self Care | Payer: Medicare Other | Attending: Internal Medicine | Admitting: Internal Medicine

## 2019-02-27 ENCOUNTER — Encounter (HOSPITAL_COMMUNITY): Payer: Self-pay

## 2019-02-27 ENCOUNTER — Emergency Department (HOSPITAL_COMMUNITY): Payer: Medicare Other

## 2019-02-27 DIAGNOSIS — I10 Essential (primary) hypertension: Secondary | ICD-10-CM | POA: Diagnosis present

## 2019-02-27 DIAGNOSIS — K279 Peptic ulcer, site unspecified, unspecified as acute or chronic, without hemorrhage or perforation: Secondary | ICD-10-CM | POA: Diagnosis present

## 2019-02-27 DIAGNOSIS — N19 Unspecified kidney failure: Secondary | ICD-10-CM | POA: Diagnosis not present

## 2019-02-27 DIAGNOSIS — K219 Gastro-esophageal reflux disease without esophagitis: Secondary | ICD-10-CM | POA: Diagnosis present

## 2019-02-27 DIAGNOSIS — R1010 Upper abdominal pain, unspecified: Secondary | ICD-10-CM | POA: Diagnosis not present

## 2019-02-27 DIAGNOSIS — R1013 Epigastric pain: Secondary | ICD-10-CM | POA: Diagnosis present

## 2019-02-27 DIAGNOSIS — Z885 Allergy status to narcotic agent status: Secondary | ICD-10-CM

## 2019-02-27 DIAGNOSIS — R109 Unspecified abdominal pain: Secondary | ICD-10-CM

## 2019-02-27 DIAGNOSIS — J4489 Other specified chronic obstructive pulmonary disease: Secondary | ICD-10-CM | POA: Diagnosis present

## 2019-02-27 DIAGNOSIS — Z880 Allergy status to penicillin: Secondary | ICD-10-CM

## 2019-02-27 DIAGNOSIS — F319 Bipolar disorder, unspecified: Secondary | ICD-10-CM | POA: Diagnosis present

## 2019-02-27 DIAGNOSIS — D649 Anemia, unspecified: Secondary | ICD-10-CM | POA: Diagnosis present

## 2019-02-27 DIAGNOSIS — I120 Hypertensive chronic kidney disease with stage 5 chronic kidney disease or end stage renal disease: Principal | ICD-10-CM | POA: Diagnosis present

## 2019-02-27 DIAGNOSIS — Z955 Presence of coronary angioplasty implant and graft: Secondary | ICD-10-CM

## 2019-02-27 DIAGNOSIS — I480 Paroxysmal atrial fibrillation: Secondary | ICD-10-CM | POA: Diagnosis present

## 2019-02-27 DIAGNOSIS — Z66 Do not resuscitate: Secondary | ICD-10-CM | POA: Diagnosis present

## 2019-02-27 DIAGNOSIS — E875 Hyperkalemia: Secondary | ICD-10-CM | POA: Diagnosis not present

## 2019-02-27 DIAGNOSIS — Z992 Dependence on renal dialysis: Secondary | ICD-10-CM

## 2019-02-27 DIAGNOSIS — Z7982 Long term (current) use of aspirin: Secondary | ICD-10-CM

## 2019-02-27 DIAGNOSIS — Z888 Allergy status to other drugs, medicaments and biological substances status: Secondary | ICD-10-CM

## 2019-02-27 DIAGNOSIS — Z9115 Patient's noncompliance with renal dialysis: Secondary | ICD-10-CM

## 2019-02-27 DIAGNOSIS — J449 Chronic obstructive pulmonary disease, unspecified: Secondary | ICD-10-CM | POA: Diagnosis present

## 2019-02-27 DIAGNOSIS — Z20822 Contact with and (suspected) exposure to covid-19: Secondary | ICD-10-CM | POA: Diagnosis present

## 2019-02-27 DIAGNOSIS — Z79899 Other long term (current) drug therapy: Secondary | ICD-10-CM

## 2019-02-27 DIAGNOSIS — K2980 Duodenitis without bleeding: Secondary | ICD-10-CM | POA: Diagnosis present

## 2019-02-27 DIAGNOSIS — N2581 Secondary hyperparathyroidism of renal origin: Secondary | ICD-10-CM | POA: Diagnosis present

## 2019-02-27 DIAGNOSIS — I4891 Unspecified atrial fibrillation: Secondary | ICD-10-CM | POA: Diagnosis not present

## 2019-02-27 DIAGNOSIS — I2510 Atherosclerotic heart disease of native coronary artery without angina pectoris: Secondary | ICD-10-CM | POA: Diagnosis present

## 2019-02-27 DIAGNOSIS — N186 End stage renal disease: Secondary | ICD-10-CM | POA: Diagnosis present

## 2019-02-27 LAB — CBC
HCT: 37.3 % (ref 36.0–46.0)
Hemoglobin: 12.1 g/dL (ref 12.0–15.0)
MCH: 30.6 pg (ref 26.0–34.0)
MCHC: 32.4 g/dL (ref 30.0–36.0)
MCV: 94.4 fL (ref 80.0–100.0)
Platelets: 240 10*3/uL (ref 150–400)
RBC: 3.95 MIL/uL (ref 3.87–5.11)
RDW: 15.1 % (ref 11.5–15.5)
WBC: 10.9 10*3/uL — ABNORMAL HIGH (ref 4.0–10.5)
nRBC: 0 % (ref 0.0–0.2)

## 2019-02-27 LAB — DIFFERENTIAL
Abs Immature Granulocytes: 0.06 10*3/uL (ref 0.00–0.07)
Basophils Absolute: 0 10*3/uL (ref 0.0–0.1)
Basophils Relative: 0 %
Eosinophils Absolute: 0.2 10*3/uL (ref 0.0–0.5)
Eosinophils Relative: 2 %
Immature Granulocytes: 1 %
Lymphocytes Relative: 9 %
Lymphs Abs: 1 10*3/uL (ref 0.7–4.0)
Monocytes Absolute: 1 10*3/uL (ref 0.1–1.0)
Monocytes Relative: 9 %
Neutro Abs: 8.7 10*3/uL — ABNORMAL HIGH (ref 1.7–7.7)
Neutrophils Relative %: 79 %

## 2019-02-27 LAB — COMPREHENSIVE METABOLIC PANEL
ALT: 10 U/L (ref 0–44)
AST: 14 U/L — ABNORMAL LOW (ref 15–41)
Albumin: 3.2 g/dL — ABNORMAL LOW (ref 3.5–5.0)
Alkaline Phosphatase: 68 U/L (ref 38–126)
Anion gap: 20 — ABNORMAL HIGH (ref 5–15)
BUN: 83 mg/dL — ABNORMAL HIGH (ref 8–23)
CO2: 18 mmol/L — ABNORMAL LOW (ref 22–32)
Calcium: 8.3 mg/dL — ABNORMAL LOW (ref 8.9–10.3)
Chloride: 97 mmol/L — ABNORMAL LOW (ref 98–111)
Creatinine, Ser: 7.79 mg/dL — ABNORMAL HIGH (ref 0.44–1.00)
GFR calc Af Amer: 6 mL/min — ABNORMAL LOW (ref >60)
GFR calc non Af Amer: 5 mL/min — ABNORMAL LOW (ref >60)
Glucose, Bld: 107 mg/dL — ABNORMAL HIGH (ref 70–99)
Potassium: 5.1 mmol/L (ref 3.5–5.1)
Sodium: 135 mmol/L (ref 135–145)
Total Bilirubin: 0.7 mg/dL (ref 0.3–1.2)
Total Protein: 6.7 g/dL (ref 6.5–8.1)

## 2019-02-27 LAB — LIPASE, BLOOD: Lipase: 70 U/L — ABNORMAL HIGH (ref 11–51)

## 2019-02-27 LAB — LACTIC ACID, PLASMA
Lactic Acid, Venous: 0.7 mmol/L (ref 0.5–1.9)
Lactic Acid, Venous: 1 mmol/L (ref 0.5–1.9)

## 2019-02-27 LAB — SARS CORONAVIRUS 2 (TAT 6-24 HRS): SARS Coronavirus 2: NEGATIVE

## 2019-02-27 MED ORDER — VITAMIN D3 25 MCG (1000 UNIT) PO TABS
2000.0000 [IU] | ORAL_TABLET | Freq: Every day | ORAL | Status: DC
  Administered 2019-02-28 – 2019-03-01 (×2): 2000 [IU] via ORAL
  Filled 2019-02-27 (×5): qty 2

## 2019-02-27 MED ORDER — SODIUM CHLORIDE 0.9% FLUSH: 3.0000 mL | Freq: Once | INTRAVENOUS | Status: DC

## 2019-02-27 MED ORDER — FUROSEMIDE 40 MG PO TABS: 40.0000 mg | ORAL_TABLET | ORAL | Status: DC

## 2019-02-27 MED ORDER — ONDANSETRON HCL 4 MG/2ML IJ SOLN: 4.0000 mg | Freq: Four times a day (QID) | INTRAMUSCULAR | Status: DC | PRN

## 2019-02-27 MED ORDER — SODIUM CHLORIDE 0.9 % IV BOLUS (SEPSIS): 500.0000 mL | Freq: Once | INTRAVENOUS | Status: DC

## 2019-02-27 MED ORDER — DOXYLAMINE SUCCINATE (SLEEP) 25 MG PO TABS
25.0000 mg | ORAL_TABLET | Freq: Every evening | ORAL | Status: DC | PRN
  Administered 2019-02-27: 25 mg via ORAL
  Filled 2019-02-27 (×2): qty 1

## 2019-02-27 MED ORDER — MOMETASONE FURO-FORMOTEROL FUM 200-5 MCG/ACT IN AERO
2.0000 | INHALATION_SPRAY | Freq: Two times a day (BID) | RESPIRATORY_TRACT | Status: DC
  Administered 2019-02-27 – 2019-02-28 (×3): 2 via RESPIRATORY_TRACT
  Filled 2019-02-27: qty 8.8

## 2019-02-27 MED ORDER — FERRIC CITRATE 1 GM 210 MG(FE) PO TABS
420.0000 mg | ORAL_TABLET | ORAL | Status: DC
  Administered 2019-02-27 – 2019-02-28 (×2): 420 mg via ORAL
  Filled 2019-02-27 (×2): qty 2

## 2019-02-27 MED ORDER — MORPHINE SULFATE (PF) 4 MG/ML IV SOLN
4.0000 mg | Freq: Once | INTRAVENOUS | Status: AC
  Administered 2019-02-27: 4 mg via INTRAVENOUS
  Filled 2019-02-27: qty 1

## 2019-02-27 MED ORDER — HEPARIN SODIUM (PORCINE) 5000 UNIT/ML IJ SOLN
5000.0000 [IU] | Freq: Three times a day (TID) | INTRAMUSCULAR | Status: DC
  Administered 2019-02-27 – 2019-03-01 (×3): 5000 [IU] via SUBCUTANEOUS
  Filled 2019-02-27 (×5): qty 1

## 2019-02-27 MED ORDER — SODIUM CHLORIDE 0.9 % IV SOLN: 125.0000 mg | INTRAVENOUS | Status: DC

## 2019-02-27 MED ORDER — POLYETHYLENE GLYCOL 3350 17 G PO PACK
17.0000 g | PACK | ORAL | Status: DC
  Filled 2019-02-27: qty 1

## 2019-02-27 MED ORDER — METOPROLOL TARTRATE 100 MG PO TABS
100.0000 mg | ORAL_TABLET | Freq: Two times a day (BID) | ORAL | Status: DC
  Administered 2019-02-27 – 2019-03-01 (×3): 100 mg via ORAL
  Filled 2019-02-27 (×2): qty 1
  Filled 2019-02-27 (×2): qty 4

## 2019-02-27 MED ORDER — LIDOCAINE-PRILOCAINE 2.5-2.5 % EX CREA: 1.0000 "application " | TOPICAL_CREAM | Freq: Every day | CUTANEOUS | Status: DC | PRN

## 2019-02-27 MED ORDER — IPRATROPIUM-ALBUTEROL 20-100 MCG/ACT IN AERS: 2.0000 | INHALATION_SPRAY | Freq: Four times a day (QID) | RESPIRATORY_TRACT | Status: DC | PRN

## 2019-02-27 MED ORDER — ALLOPURINOL 100 MG PO TABS
100.0000 mg | ORAL_TABLET | Freq: Every day | ORAL | Status: DC
  Administered 2019-02-28 – 2019-03-01 (×2): 100 mg via ORAL
  Filled 2019-02-27 (×2): qty 1

## 2019-02-27 MED ORDER — CALCITRIOL 0.5 MCG PO CAPS
0.7500 ug | ORAL_CAPSULE | ORAL | Status: DC
  Administered 2019-03-01: 0.75 ug via ORAL
  Filled 2019-02-27: qty 1

## 2019-02-27 MED ORDER — ALBUTEROL SULFATE (2.5 MG/3ML) 0.083% IN NEBU: 2.5000 mg | INHALATION_SOLUTION | Freq: Four times a day (QID) | RESPIRATORY_TRACT | Status: DC | PRN

## 2019-02-27 MED ORDER — DOCUSATE SODIUM 100 MG PO CAPS
100.0000 mg | ORAL_CAPSULE | Freq: Two times a day (BID) | ORAL | Status: DC
  Administered 2019-02-27 – 2019-03-01 (×4): 100 mg via ORAL
  Filled 2019-02-27 (×4): qty 1

## 2019-02-27 MED ORDER — ACETAMINOPHEN 650 MG RE SUPP: 650.0000 mg | Freq: Four times a day (QID) | RECTAL | Status: DC | PRN

## 2019-02-27 MED ORDER — CALCITRIOL 0.5 MCG PO CAPS
0.7500 ug | ORAL_CAPSULE | Freq: Once | ORAL | Status: AC
  Administered 2019-02-27: 0.75 ug via ORAL
  Filled 2019-02-27: qty 1

## 2019-02-27 MED ORDER — IPRATROPIUM-ALBUTEROL 0.5-2.5 (3) MG/3ML IN SOLN: 3.0000 mL | Freq: Four times a day (QID) | RESPIRATORY_TRACT | Status: DC | PRN

## 2019-02-27 MED ORDER — PANTOPRAZOLE SODIUM 40 MG IV SOLR: 40.0000 mg | Freq: Once | INTRAVENOUS | Status: DC

## 2019-02-27 MED ORDER — SENNOSIDES-DOCUSATE SODIUM 8.6-50 MG PO TABS
1.0000 | ORAL_TABLET | Freq: Two times a day (BID) | ORAL | Status: DC
  Administered 2019-02-27 – 2019-03-01 (×4): 1 via ORAL
  Filled 2019-02-27 (×4): qty 1

## 2019-02-27 MED ORDER — GABAPENTIN 300 MG PO CAPS
300.0000 mg | ORAL_CAPSULE | Freq: Every day | ORAL | Status: DC
  Administered 2019-02-27 – 2019-02-28 (×2): 300 mg via ORAL
  Filled 2019-02-27 (×2): qty 1

## 2019-02-27 MED ORDER — SODIUM CHLORIDE 0.9 % IV BOLUS (SEPSIS)
250.0000 mL | Freq: Once | INTRAVENOUS | Status: AC
  Administered 2019-02-27: 250 mL via INTRAVENOUS

## 2019-02-27 MED ORDER — POLYVINYL ALCOHOL 1.4 % OP SOLN
1.0000 [drp] | OPHTHALMIC | Status: DC | PRN
  Filled 2019-02-27: qty 15

## 2019-02-27 MED ORDER — PRAVASTATIN SODIUM 40 MG PO TABS
80.0000 mg | ORAL_TABLET | Freq: Every evening | ORAL | Status: DC
  Administered 2019-02-27 – 2019-02-28 (×2): 80 mg via ORAL
  Filled 2019-02-27 (×2): qty 2

## 2019-02-27 MED ORDER — ONDANSETRON HCL 4 MG PO TABS: 4.0000 mg | ORAL_TABLET | Freq: Four times a day (QID) | ORAL | Status: DC | PRN

## 2019-02-27 MED ORDER — FUROSEMIDE 40 MG PO TABS
40.0000 mg | ORAL_TABLET | ORAL | Status: DC
  Administered 2019-03-01: 40 mg via ORAL
  Filled 2019-02-27: qty 1

## 2019-02-27 MED ORDER — SODIUM CHLORIDE 0.9 % IV BOLUS (SEPSIS): 2000.0000 mL | Freq: Once | INTRAVENOUS | Status: DC

## 2019-02-27 MED ORDER — ISOSORBIDE MONONITRATE ER 30 MG PO TB24
30.0000 mg | ORAL_TABLET | Freq: Every day | ORAL | Status: DC
  Administered 2019-02-28 – 2019-03-01 (×2): 30 mg via ORAL
  Filled 2019-02-27 (×3): qty 1

## 2019-02-27 MED ORDER — CARBOXYMETHYLCELLUL-GLYCERIN 0.5-0.9 % OP SOLN: 1.0000 [drp] | Freq: Every day | OPHTHALMIC | Status: DC | PRN

## 2019-02-27 MED ORDER — NITROGLYCERIN 0.4 MG SL SUBL: 0.4000 mg | SUBLINGUAL_TABLET | SUBLINGUAL | Status: DC | PRN

## 2019-02-27 MED ORDER — CHLORHEXIDINE GLUCONATE CLOTH 2 % EX PADS
6.0000 | MEDICATED_PAD | Freq: Every day | CUTANEOUS | Status: DC
  Administered 2019-02-28: 6 via TOPICAL

## 2019-02-27 MED ORDER — AMLODIPINE BESYLATE 10 MG PO TABS
10.0000 mg | ORAL_TABLET | Freq: Every day | ORAL | Status: DC
  Administered 2019-02-28 – 2019-03-01 (×2): 10 mg via ORAL
  Filled 2019-02-27: qty 2
  Filled 2019-02-27 (×2): qty 1

## 2019-02-27 MED ORDER — PANTOPRAZOLE SODIUM 40 MG PO TBEC
40.0000 mg | DELAYED_RELEASE_TABLET | Freq: Two times a day (BID) | ORAL | Status: DC
  Administered 2019-02-28 – 2019-03-01 (×3): 40 mg via ORAL
  Filled 2019-02-27 (×3): qty 1

## 2019-02-27 MED ORDER — ONDANSETRON HCL 4 MG/2ML IJ SOLN
4.0000 mg | Freq: Once | INTRAMUSCULAR | Status: AC
  Administered 2019-02-27: 4 mg via INTRAVENOUS
  Filled 2019-02-27: qty 2

## 2019-02-27 MED ORDER — FERRIC CITRATE 1 GM 210 MG(FE) PO TABS
630.0000 mg | ORAL_TABLET | Freq: Three times a day (TID) | ORAL | Status: DC
  Administered 2019-02-28: 630 mg via ORAL
  Filled 2019-02-27 (×5): qty 3

## 2019-02-27 MED ORDER — ASPIRIN EC 81 MG PO TBEC
81.0000 mg | DELAYED_RELEASE_TABLET | Freq: Every day | ORAL | Status: DC
  Administered 2019-02-28 – 2019-03-01 (×2): 81 mg via ORAL
  Filled 2019-02-27 (×2): qty 1

## 2019-02-27 MED ORDER — ACETAMINOPHEN 325 MG PO TABS: 650.0000 mg | ORAL_TABLET | Freq: Four times a day (QID) | ORAL | Status: DC | PRN

## 2019-02-27 MED ORDER — DICYCLOMINE HCL 10 MG PO CAPS
20.0000 mg | ORAL_CAPSULE | Freq: Every day | ORAL | Status: DC
  Administered 2019-02-27 – 2019-02-28 (×2): 20 mg via ORAL
  Filled 2019-02-27 (×2): qty 2

## 2019-02-27 NOTE — Progress Notes (Addendum)
[facility] Progress Note  Background:  This is the third presentation this month for 69 year old female with ESRD on TTS schedule  In [place] with PMHx of HTN, COPD, CAD with stents, bipolar disorder, stubstace abuse, afib, GERD, who was discharged 1/27 to home and  Admission for acute toxic encephalopathy though to be iatrogenic polypharmcy, afib with RVR and abdominal pain with  elevated lipase 206 > 40 but no CT findings of pancreatitis.  She had missed several dialysis treatments prior to that admission and has missed the last two since her discharge on Wednesday.   She presented to the ED with sharp abdominal pain N, V and diarrhea without fever. Lipase 70 , LFT wnl WBC 10.9 UA ordered   Dialysis Orders: TTS -[place] 4hrs, BFR250, DFRAF1.5, EDW 47kg,2K/2Ca  Access:LUAVF, TDC Heparinnone Mircera75 - last 02/17/19 - has been d/c Venofer 50mg  qHD Calcitriol0.27mcg PO qHD  Assessment/Plan: 1. Abdominal pain - ?PUD per CT, uremia, other, per primary 2. ESRD -TTS  last HD during prior admission - plan HD in am K 5.1 BUN 83 Cr 7.79 CO2 18 Has been using 1 or 2needles with AVF - continue to titrate up as tolerated.Given lower BFR with AVF - should run 3.5 hours - and then she needs HD again Tuesday.  3. Anemia - hgb 12.1 - no ESA -  4. Secondary hyperparathyroidism - cont meds 5. HTN/volume - continue same meds from d/c EDW to 47 , lasix on nonHD days 6. Nutrition  Renal diet with fluid restirciton  7. DNR/DNI last admission 8. afib - on BB 9. COPD 10. CAD s/p stents 11. Nonadherence to dialysis - continue to address barriers to attendance 12. Substance abuse   Myriam Jacobson, PA-C [facility] Beeper [phone] 02/27/2019,4:11 PM  LOS: 0 days   Pt seen, examined and agree w A/P as above.  Kelly Splinter  MD 02/27/2019, 7:43 PM    Subjective:   As above  Objective Vitals:   02/27/19 1302 02/27/19 1310  BP:  136/90  Pulse: 84   Resp:  (!) 24   Temp: 97.7 F (36.5 C)   TempSrc: Oral   SpO2: 100%    Physical Exam General: alert, no distress, eating steak and mushrooms Heart: RRR no rg Lungs: CTA bilat Abdomen: mild diffuse tenderenss, no ascites Extremities: no LE edema Dialysis Access: left upper AVF, R IJ [facility]    Additional Objective Labs: Basic Metabolic Panel: Recent Labs  Lab 02/21/19 0834 02/22/19 1118 02/23/19 0808 02/27/19 1315  NA 137  --  137 135  K 5.2*  --  4.1 5.1  CL 100  --  95* 97*  CO2 21*  --  26 18*  GLUCOSE 124*  --  104* 107*  BUN 59*  --  24* 83*  CREATININE 6.57*  --  4.15* 7.79*  CALCIUM 9.2  --  9.1 8.3*  PHOS  --  5.9* 5.0*  --    Liver Function Tests: Recent Labs  Lab 02/21/19 0834 02/23/19 0808 02/27/19 1315  AST 18  --  14*  ALT 12  --  10  ALKPHOS 67  --  68  BILITOT 1.0  --  0.7  PROT 7.3  --  6.7  ALBUMIN 3.3* 3.1* 3.2*   Recent Labs  Lab 02/21/19 0834 02/23/19 0808 02/27/19 1315  LIPASE 201* 40 70*   CBC: Recent Labs  Lab 02/21/19 0834 02/21/19 1621 02/27/19 1315  WBC 10.8* 8.3 10.9*  NEUTROABS  8.1*  --  8.7*  HGB 12.6 11.5* 12.1  HCT 39.1 35.5* 37.3  MCV 95.6 95.2 94.4  PLT 287 280 240   Blood Culture    Component Value Date/Time   SDES URINE, RANDOM 02/21/2019 1440   SPECREQUEST  02/21/2019 1440    NONE Performed at [facility], [place] [address] [place], [place] [address]    CULT MULTIPLE SPECIES PRESENT, SUGGEST RECOLLECTION (A) 02/21/2019 1440   REPTSTATUS 02/22/2019 FINAL 02/21/2019 1440    Cardiac Enzymes: No results for input(s): CKTOTAL, CKMB, CKMBINDEX, TROPONINI in the last 168 hours. CBG: No results for input(s): GLUCAP in the last 168 hours. Iron Studies: No results for input(s): IRON, TIBC, TRANSFERRIN, FERRITIN in the last 72 hours. Lab Results  Component Value Date   INR 0.9 01/26/2019   INR 1.0 01/25/2019   INR 0.9 04/01/2008   Studies/Results: CT ABDOMEN PELVIS WO CONTRAST  Result Date:  02/27/2019 CLINICAL DATA:  Abdominal distension.  Pain.  Vomiting and diarrhea. EXAM: CT ABDOMEN AND PELVIS WITHOUT CONTRAST TECHNIQUE: Multidetector CT imaging of the abdomen and pelvis was performed following the standard protocol without IV contrast. COMPARISON:  February 21, 2019 FINDINGS: Lower chest: Atelectasis is seen in the left lung base. Lung bases otherwise unremarkable. No other changes in the lower chest. Hepatobiliary: The liver is unremarkable. The gallbladder is poorly distended. There is fat stranding and a small amount of fluid in the right upper quadrant which is thought to arise from the duodenum, described below. This stranding in fluid does abut the gallbladder but the gallbladder is not thought to be the cause. Pancreas: Unremarkable. No pancreatic ductal dilatation or surrounding inflammatory changes. Spleen: Normal in size without focal abnormality. Adrenals/Urinary Tract: Again noted is a left adrenal adenoma measuring 2.6 cm. The right adrenal nodule measuring up to 2.2 cm is indeterminate on this study but has been stable since 2012 consistent with a benign etiology such as an adenoma. Bilateral renal cysts are identified. There is an indeterminate mass exophytic off the posterior left kidney on axial image 25 measuring up to 8 mm, not present on the February 21, 2019 study. Right-sided renal cysts are seen as well. Vascular calcifications are associated with both kidneys. No definite renal calculi. No hydronephrosis. No ureteral stones or ureterectasis. The bladder is normal. Stomach/Bowel: The stomach is normal. The duodenum is thickened with adjacent fat stranding, involving the second proximal third portions of the duodenum. No extraluminal gas identified. No obvious ulceration. 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 Remainder of the small bowel is unchanged. No small bowel obstruction identified. Moderate fecal loading in the colon. The colon  is otherwise unremarkable. The appendix is not seen consistent previous appendectomy. Vascular/Lymphatic: Dense atherosclerosis is seen in the nonaneurysmal tortuous abdominal aorta. Atherosclerotic change extends into the iliac and femoral vessels. No adenopathy. Reproductive: Status post hysterectomy. No adnexal masses. Other: No abdominal wall hernia or abnormality. No abdominopelvic ascites. Musculoskeletal: No acute or significant osseous findings. IMPRESSION: 1. The duodenum is thick walled with adjacent fat stranding and a small amount of fluid. The findings are consistent with duodenal inflammation which is often seen with peptic ulcer disease. Infectious and other inflammatory causes are other possibilities. Recommend clinical correlation. No extraluminal gas or ulceration is seen on this study. 2. There is an indeterminate 8 mm mass exophytic off the left kidney, not present February 21, 2019. Neoplasm is unlikely given the short interval and a hemorrhagic or  complicated cyst is most likely. An MRI could further assess as an outpatient. 3. Adrenal adenomas, unchanged. 4. Moderate fecal loading in the colon. 5. Significant atherosclerotic changes in the aorta, iliac vessels, and femoral vessels. Electronically Signed   By: Dorise Bullion III M.D   On: 02/27/2019 14:54   Medications: . sodium chloride 250 mL (02/27/19 1519)   . sodium chloride flush  3 mL Intravenous Once

## 2019-02-27 NOTE — H&P (Addendum)
History and Physical    Christina [name] BHA:[id number] DOB: Mar 24, 1950 DOA: 02/27/2019  PCP: Christina Schwalbe, PA-C   Patient coming from: Home  I have personally briefly reviewed patient's old medical records in [place]  Chief Complaint: Abd pain, N/V  HPI: Christina [name] is a 69 y.o. female with medical history significant of SBO; HTN; HLD; depression; CAD with stents; COPD; ESRD on TTS HD; and afib presenting with abdominal pain after several missed HD treatments x3.  She was just recently hospitalized for similar reason.  Her regular HD schedule is TTS, she was discharged from hospital on this past Tuesday, she did not go to her Tuesday dialysis.  On Thursday she says she feels very weak and did not go.  On Saturday, she says she went to another appointment, and missed dialysis.  Then last night, after eating dinner, she started to feel severe sharp like epigastric pain, localized cramping like, 8-9 over 10, episodic, associated with more than 10 times of vomiting with stomach content, nonbilious nonbloody.  Along with mild episode of loose bowel movement.  Denies any fever chills, no chest pain. ED Course: CT showed inflammation of duodenum implying peptic ulcer.  Review of Systems: As per HPI otherwise 10 point review of systems negative.    Past Medical History:  Diagnosis Date  . Anemia   . Anxiety   . Arthritis   . Asthma   . Atrial fibrillation ([place])   . Chronic kidney disease    Dialysis T/Th/Sa  started in March 2020  . COPD (chronic obstructive pulmonary disease) ([place])   . Coronary artery disease    2 stents  . Depression   . GERD (gastroesophageal reflux disease)   . Gout   . Headache    migraines  . History of kidney stones   . Hyperlipidemia   . Hypertension   . Pneumonia   . Small bowel obstruction [facility])     Past Surgical History:  Procedure Laterality Date  . ABDOMINAL HYSTERECTOMY    . ABDOMINAL SURGERY     for small bowel obstruction  - x 2  . APPENDECTOMY    . AV FISTULA PLACEMENT Left 08/04/2018   Procedure: ARTERIOVENOUS (AV) FISTULA CREATION LEFT ARM;  Surgeon: Christina Sandy, MD;  Location: [facility];  Service: Vascular;  Laterality: Left;  . BASCILIC VEIN TRANSPOSITION Left 11/24/2018   Procedure: SECOND STAGE BASILIC VEIN TRANSPOSITION LEFT ARM;  Surgeon: Christina Sandy, MD;  Location: [facility];  Service: Vascular;  Laterality: Left;  . CARDIAC CATHETERIZATION    . CORONARY ANGIOPLASTY  ?2003/2004  . FACIAL RECONSTRUCTION SURGERY     x 2  . HERNIA REPAIR       reports that she quit smoking about 2 years ago. She has a 25.00 pack-year smoking history. She has never used smokeless tobacco. She reports previous alcohol use. She reports previous drug use. Drug: Marijuana.  Allergies  Allergen Reactions  . Cyclobenzaprine Anaphylaxis and Other (See Comments)    "stopped heart"   . Morphine Anaphylaxis    "stopped heart"  . Penicillins Shortness Of Breath, Swelling and Palpitations    Did it involve swelling of the face/tongue/throat, SOB, or low BP? Yes Did it involve sudden or severe rash/hives, skin peeling, or any reaction on the inside of your mouth or nose? No Did you need to seek medical attention at a hospital or doctor's office? No When did it last happen?years  If all above answers  are "NO", may proceed with cephalosporin use.   . Ambien [Zolpidem] Other (See Comments)    Severe confusion  . Codeine Itching and Rash  . Hydromorphone Other (See Comments)    If administered quickly, felt like hand was "exploding"     No family history on file.   Prior to Admission medications   Medication Sig Start Date End Date Taking? Authorizing Provider  albuterol (PROVENTIL) (2.5 MG/3ML) 0.083% nebulizer solution Take 2.5 mg by nebulization every 6 (six) hours as needed for wheezing or shortness of breath.    [provider]  allopurinol (ZYLOPRIM) 100 MG tablet Take 100 mg by  mouth daily. 02/18/18   [provider]  amLODipine (NORVASC) 10 MG tablet Take 10 mg by mouth daily.    [provider]  aspirin EC 81 MG tablet Take 81 mg by mouth daily.    [provider]  AURYXIA 1 GM 210 MG(Fe) tablet Take 420 mg by mouth See admin instructions. Take 3 tablets (630 mg) by mouth with each meal & take 2 tablets (420 mg) by mouth with each snack 08/16/18   [provider]  Carboxymethylcellul-Glycerin (LUBRICATING EYE DROPS OP) Place 1 drop into both eyes daily as needed (dry eyes).    [provider]  Cholecalciferol (VITAMIN D3) 50 MCG (2000 UT) capsule Take 2,000 Units by mouth daily.     [provider]  dicyclomine (BENTYL) 10 MG capsule Take 20 mg by mouth at bedtime.  08/27/17   [provider]  docusate sodium (COLACE) 100 MG capsule Take 1 capsule (100 mg total) by mouth 2 (two) times daily. 08/11/18   Hongalgi, Lenis Dickinson, MD  doxylamine, Sleep, (UNISOM) 25 MG tablet Take 25 mg by mouth at bedtime as needed for sleep.    [provider]  furosemide (LASIX) 80 MG tablet Take 80 mg by mouth See admin instructions. Take one tablet (40 mg totally) by mouth on TUE THUR SAT before dialysis. 10/21/18   [provider]  gabapentin (NEURONTIN) 300 MG capsule Take 1 capsule (300 mg total) by mouth at bedtime. 02/23/19   Mercy Riding, MD  Ipratropium-Albuterol (COMBIVENT) 20-100 MCG/ACT AERS respimat Inhale 2 puffs into the lungs 4 (four) times daily as needed for wheezing. 09/11/17   [provider]  isosorbide mononitrate (IMDUR) 30 MG 24 hr tablet Take 30 mg by mouth daily. 08/17/18   [provider]  lidocaine-prilocaine (EMLA) cream Apply 1 application topically daily as needed (1-2 hours before dialysis (cover with occlusive dressing)).  09/03/18   [provider]  metoprolol tartrate (LOPRESSOR) 100 MG tablet Take 1 tablet (100 mg total) by mouth 2 (two) times daily. 01/26/19    Little Ishikawa, MD  nitroGLYCERIN (NITROSTAT) 0.4 MG SL tablet Place 0.4 mg under the tongue every 5 (five) minutes as needed for chest pain.  09/18/16   [provider]  ondansetron (ZOFRAN) 4 MG tablet Take 4 mg by mouth 3 (three) times daily as needed for nausea. 09/14/18   [provider]  pantoprazole (PROTONIX) 40 MG tablet Take 1 tablet (40 mg total) by mouth daily. Patient taking differently: Take 40 mg by mouth daily as needed (acid reflux/indigestion.).  08/11/18   Hongalgi, Lenis Dickinson, MD  polyethylene glycol (MIRALAX / GLYCOLAX) 17 g packet Take 17 g by mouth 2 (two) times daily. Patient taking differently: Take 17 g by mouth every other day.  08/11/18   Hongalgi, Lenis Dickinson, MD  pravastatin (PRAVACHOL)  80 MG tablet Take 80 mg by mouth every evening.  06/30/16   [provider]  sennosides-docusate sodium (SENOKOT-S) 8.6-50 MG tablet Take 1 tablet by mouth 2 (two) times daily.     [provider]  SYMBICORT 160-4.5 MCG/ACT inhaler Inhale 2 puffs into the lungs every 4 (four) hours as needed (for wheezing).  09/16/18   [provider]    Physical Exam: Vitals:   02/27/19 1302 02/27/19 1310  BP:  136/90  Pulse: 84   Resp: (!) 24   Temp: 97.7 F (36.5 C)   TempSrc: Oral   SpO2: 100%     Constitutional: NAD, calm, comfortable Vitals:   02/27/19 1302 02/27/19 1310  BP:  136/90  Pulse: 84   Resp: (!) 24   Temp: 97.7 F (36.5 C)   TempSrc: Oral   SpO2: 100%    Eyes: PERRL, lids and conjunctivae normal ENMT: Mucous membranes are moist. Posterior pharynx clear of any exudate or lesions.Normal dentition.  Neck: normal, supple, no masses, no thyromegaly Respiratory: clear to auscultation bilaterally, no wheezing, no crackles. Normal respiratory effort. No accessory muscle use.  Cardiovascular: Regular rate and rhythm, no murmurs / rubs / gallops. No extremity edema. 2+ pedal pulses. No carotid bruits.  Abdomen: Tenderness on epigastric  area on deep palpations, no rebound no guarding. Bowel sounds positive.  Musculoskeletal: no clubbing / cyanosis. No joint deformity upper and lower extremities. Good ROM, no contractures. Normal muscle tone.  Skin: no rashes, lesions, ulcers. No induration Neurologic: CN 2-12 grossly intact. Sensation intact, DTR normal. Strength 5/5 in all 4.  Psychiatric: Normal judgment and insight. Alert and oriented x 3. Normal mood.     Labs on Admission: I have personally reviewed following labs and imaging studies  CBC: Recent Labs  Lab 02/21/19 0834 02/21/19 1621 02/27/19 1315  WBC 10.8* 8.3 10.9*  NEUTROABS 8.1*  --  8.7*  HGB 12.6 11.5* 12.1  HCT 39.1 35.5* 37.3  MCV 95.6 95.2 94.4  PLT 287 280 563   Basic Metabolic Panel: Recent Labs  Lab 02/21/19 0834 02/22/19 1118 02/23/19 0808 02/27/19 1315  NA 137  --  137 135  K 5.2*  --  4.1 5.1  CL 100  --  95* 97*  CO2 21*  --  26 18*  GLUCOSE 124*  --  104* 107*  BUN 59*  --  24* 83*  CREATININE 6.57*  --  4.15* 7.79*  CALCIUM 9.2  --  9.1 8.3*  MG 2.0  --   --   --   PHOS  --  5.9* 5.0*  --    GFR: Estimated Creatinine Clearance: 5.1 mL/min (A) (by C-G formula based on SCr of 7.79 mg/dL (H)). Liver Function Tests: Recent Labs  Lab 02/21/19 0834 02/23/19 0808 02/27/19 1315  AST 18  --  14*  ALT 12  --  10  ALKPHOS 67  --  68  BILITOT 1.0  --  0.7  PROT 7.3  --  6.7  ALBUMIN 3.3* 3.1* 3.2*   Recent Labs  Lab 02/21/19 0834 02/23/19 0808 02/27/19 1315  LIPASE 201* 40 70*   No results for input(s): AMMONIA in the last 168 hours. Coagulation Profile: No results for input(s): INR, PROTIME in the last 168 hours. Cardiac Enzymes: No results for input(s): CKTOTAL, CKMB, CKMBINDEX, TROPONINI in the last 168 hours. BNP (last 3 results) No results for input(s): PROBNP in the last 8760 hours. HbA1C: No results for input(s): HGBA1C in  the last 72 hours. CBG: No results for input(s): GLUCAP in the last 168 hours. Lipid  Profile: No results for input(s): CHOL, HDL, LDLCALC, TRIG, CHOLHDL, LDLDIRECT in the last 72 hours. Thyroid Function Tests: No results for input(s): TSH, T4TOTAL, FREET4, T3FREE, THYROIDAB in the last 72 hours. Anemia Panel: No results for input(s): VITAMINB12, FOLATE, FERRITIN, TIBC, IRON, RETICCTPCT in the last 72 hours. Urine analysis:    Component Value Date/Time   COLORURINE YELLOW 02/21/2019 0754   APPEARANCEUR CLEAR 02/21/2019 0754   LABSPEC 1.006 02/21/2019 0754   PHURINE 7.0 02/21/2019 0754   GLUCOSEU NEGATIVE 02/21/2019 0754   HGBUR NEGATIVE 02/21/2019 [place] 02/21/2019 0754   [place] 02/21/2019 0754   PROTEINUR 100 (A) 02/21/2019 0754   NITRITE NEGATIVE 02/21/2019 0754   LEUKOCYTESUR NEGATIVE 02/21/2019 0754    Radiological Exams on Admission: CT ABDOMEN PELVIS WO CONTRAST  Result Date: 02/27/2019 CLINICAL DATA:  Abdominal distension.  Pain.  Vomiting and diarrhea. EXAM: CT ABDOMEN AND PELVIS WITHOUT CONTRAST TECHNIQUE: Multidetector CT imaging of the abdomen and pelvis was performed following the standard protocol without IV contrast. COMPARISON:  February 21, 2019 FINDINGS: Lower chest: Atelectasis is seen in the left lung base. Lung bases otherwise unremarkable. No other changes in the lower chest. Hepatobiliary: The liver is unremarkable. The gallbladder is poorly distended. There is fat stranding and a small amount of fluid in the right upper quadrant which is thought to arise from the duodenum, described below. This stranding in fluid does abut the gallbladder but the gallbladder is not thought to be the cause. Pancreas: Unremarkable. No pancreatic ductal dilatation or surrounding inflammatory changes. Spleen: Normal in size without focal abnormality. Adrenals/Urinary Tract: Again noted is a left adrenal adenoma measuring 2.6 cm. The right adrenal nodule measuring up to 2.2 cm is indeterminate on this study but has been stable since 2012  consistent with a benign etiology such as an adenoma. Bilateral renal cysts are identified. There is an indeterminate mass exophytic off the posterior left kidney on axial image 25 measuring up to 8 mm, not present on the February 21, 2019 study. Right-sided renal cysts are seen as well. Vascular calcifications are associated with both kidneys. No definite renal calculi. No hydronephrosis. No ureteral stones or ureterectasis. The bladder is normal. Stomach/Bowel: The stomach is normal. The duodenum is thickened with adjacent fat stranding, involving the second proximal third portions of the duodenum. No extraluminal gas identified. No obvious ulceration. 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 Remainder of the small bowel is unchanged. No small bowel obstruction identified. Moderate fecal loading in the colon. The colon is otherwise unremarkable. The appendix is not seen consistent previous appendectomy. Vascular/Lymphatic: Dense atherosclerosis is seen in the nonaneurysmal tortuous abdominal aorta. Atherosclerotic change extends into the iliac and femoral vessels. No adenopathy. Reproductive: Status post hysterectomy. No adnexal masses. Other: No abdominal wall hernia or abnormality. No abdominopelvic ascites. Musculoskeletal: No acute or significant osseous findings. IMPRESSION: 1. The duodenum is thick walled with adjacent fat stranding and a small amount of fluid. The findings are consistent with duodenal inflammation which is often seen with peptic ulcer disease. Infectious and other inflammatory causes are other possibilities. Recommend clinical correlation. No extraluminal gas or ulceration is seen on this study. 2. There is an indeterminate 8 mm mass exophytic off the left kidney, not present February 21, 2019. Neoplasm is unlikely given the short interval and a hemorrhagic or complicated cyst is most  likely. An MRI could further assess as an outpatient. 3. Adrenal  adenomas, unchanged. 4. Moderate fecal loading in the colon. 5. Significant atherosclerotic changes in the aorta, iliac vessels, and femoral vessels. Electronically Signed   By: Dorise Bullion III M.D   On: 02/27/2019 14:54    EKG: Independently reviewed.  No acute ST-T changes  Assessment/Plan Active Problems:   Abdominal pain   Uremia  Acute epigastric pain Probably related to missed dialysis, and uremia, CT showing questionable peptic ulcer, and clinically suspect uremic gastritis/duodenitis. She is already on PPI, will double dose her PPI Check stool H. pylori antigen. If no improvement consider GI consult As needed Zofran.  Paroxysmal A. Fib In sinus rhythm Not a candidate for systemic anticoagulation for significant history of GI bleed. Continue metoprolol  ESRD on HD Nephrology ordered dialysis for today Regarding her noncompliance issue, denied feeling depressed and repeated same she missed dialysis because she felt weak.  But her neuro exam benign and she was able to go to another medical appointment yesterday.  Hypertension, Fairly controlled, continue current regimen.  COPD No symptoms signs of acute exacerbation Continue maintenance medications.    DVT prophylaxis: Heparin subcu Code Status: DNR Family Communication: None at bedside Disposition Plan: Home once her symptoms improved, expect improvement after HD and BUN level comes down. Outpt f/u for H Pylori result. Consults called: Nephrology Admission status: Floor Obs.   Lequita Halt MD [facility] Pager [phone]  If 7PM-7AM, please contact night-coverage [email or web address] Password [facility]  02/27/2019, 4:56 PM

## 2019-02-27 NOTE — Progress Notes (Signed)
Report received from ED nurse.  Room prepared for patient.  NT given report.  Jillyn Ledger, MBA, BSN, RN

## 2019-02-27 NOTE — ED Triage Notes (Signed)
Patient complains of generalized sharp abdominal pain that started last night with nausea, vomiting and diarrhea, denies fever

## 2019-02-27 NOTE — ED Provider Notes (Signed)
[facility] EMERGENCY DEPARTMENT Provider Note   CSN: [id number] Arrival date & time: 02/27/19  1257     History Chief Complaint  Patient presents with  . Abdominal Pain    Christina [name] is a 69 y.o. female.  Patient complains of vomiting and diarrhea.  Patient was just in the hospital and was discharged to stay.  She has not gone to her last 2 dialysis.  She was with have dialysis on Thursday and Saturday.  She states she was too sick weak to get up and go  The history is provided by the patient. No language interpreter was used.  Abdominal Pain Pain location:  Epigastric Pain quality: aching   Pain radiates to:  Does not radiate Pain severity:  Moderate Onset quality:  Sudden Timing:  Constant Progression:  Worsening Chronicity:  Recurrent Context: not awakening from sleep   Relieved by:  Nothing Associated symptoms: no chest pain, no cough, no diarrhea, no fatigue and no hematuria        Past Medical History:  Diagnosis Date  . Anemia   . Anxiety   . Arthritis   . Asthma   . Atrial fibrillation ([place])   . Chronic kidney disease    Dialysis T/Th/Sa  started in March 2020  . COPD (chronic obstructive pulmonary disease) ([place])   . Coronary artery disease    2 stents  . Depression   . GERD (gastroesophageal reflux disease)   . Gout   . Headache    migraines  . History of kidney stones   . Hyperlipidemia   . Hypertension   . Pneumonia   . Small bowel obstruction [facility])     Patient Active Problem List   Diagnosis Date Noted  . Volume overload 02/21/2019  . Abdominal pain 02/21/2019  . Non-compliance with renal dialysis ([place]) 02/21/2019  . Respiratory failure ([place]) 02/04/2019  . Acute and chr resp failure, unsp w hypoxia or hypercapnia ([place])   . Atrial fibrillation with RVR ([place]) 01/25/2019  . SBO (small bowel obstruction) ([place]) 08/10/2018  . Anemia due to chronic blood loss   . Heme positive stool   . Platelet inhibition due to  Plavix   . Acute GI bleeding 05/06/2018  . ESRD (end stage renal disease) ([place]) 05/06/2018  . Anxiety 05/06/2018  . Bipolar affective ([place]) 05/06/2018  . CAD (coronary artery disease), native coronary artery 05/06/2018  . COPD with chronic bronchitis ([place]) 05/06/2018  . CVA (cerebral vascular accident) ([place]) 05/06/2018  . Essential hypertension 05/06/2018  . Hyperlipidemia 05/06/2018    Past Surgical History:  Procedure Laterality Date  . ABDOMINAL HYSTERECTOMY    . ABDOMINAL SURGERY     for small bowel obstruction - x 2  . APPENDECTOMY    . AV FISTULA PLACEMENT Left 08/04/2018   Procedure: ARTERIOVENOUS (AV) FISTULA CREATION LEFT ARM;  Surgeon: Waynetta Sandy, MD;  Location: [facility];  Service: Vascular;  Laterality: Left;  . BASCILIC VEIN TRANSPOSITION Left 11/24/2018   Procedure: SECOND STAGE BASILIC VEIN TRANSPOSITION LEFT ARM;  Surgeon: Waynetta Sandy, MD;  Location: [place];  Service: Vascular;  Laterality: Left;  . CARDIAC CATHETERIZATION    . CORONARY ANGIOPLASTY  ?2003/2004  . FACIAL RECONSTRUCTION SURGERY     x 2  . HERNIA REPAIR       OB History   No obstetric history on file.     No family history on file.  Social History   Tobacco Use  . Smoking  status: Former Smoker    Packs/day: 0.50    Years: 50.00    Pack years: 25.00    Quit date: 2019    Years since quitting: 2.0  . Smokeless tobacco: Never Used  Substance Use Topics  . Alcohol use: Not Currently    Comment: quit 2019 - "I was a drunk"  . Drug use: Not Currently    Types: Marijuana    Comment: h/o drug use - "I was raised in the 60s"; last use 18 months ago; smokes marijuana periodically    Home Medications Prior to Admission medications   Medication Sig Start Date End Date Taking? Authorizing Provider  albuterol (PROVENTIL) (2.5 MG/3ML) 0.083% nebulizer solution Take 2.5 mg by nebulization every 6 (six) hours as needed for wheezing or shortness of breath.    [provider]  allopurinol (ZYLOPRIM) 100 MG tablet Take 100 mg by mouth daily. 02/18/18   [provider]  amLODipine (NORVASC) 10 MG tablet Take 10 mg by mouth daily.    [provider]  aspirin EC 81 MG tablet Take 81 mg by mouth daily.    [provider]  AURYXIA 1 GM 210 MG(Fe) tablet Take 420 mg by mouth See admin instructions. Take 3 tablets (630 mg) by mouth with each meal & take 2 tablets (420 mg) by mouth with each snack 08/16/18   [provider]  Carboxymethylcellul-Glycerin (LUBRICATING EYE DROPS OP) Place 1 drop into both eyes daily as needed (dry eyes).    [provider]  Cholecalciferol (VITAMIN D3) 50 MCG (2000 UT) capsule Take 2,000 Units by mouth daily.     [provider]  dicyclomine (BENTYL) 10 MG capsule Take 20 mg by mouth at bedtime.  08/27/17   [provider]  docusate sodium (COLACE) 100 MG capsule Take 1 capsule (100 mg total) by mouth 2 (two) times daily. 08/11/18   Hongalgi, Lenis Dickinson, MD  doxylamine, Sleep, (UNISOM) 25 MG tablet Take 25 mg by mouth at bedtime as needed for sleep.    [provider]  furosemide (LASIX) 80 MG tablet Take 80 mg by mouth See admin instructions. Take one tablet (40 mg totally) by mouth on TUE THUR SAT before dialysis. 10/21/18   [provider]  gabapentin (NEURONTIN) 300 MG capsule Take 1 capsule (300 mg total) by mouth at bedtime. 02/23/19   Mercy Riding, MD  Ipratropium-Albuterol (COMBIVENT) 20-100 MCG/ACT AERS respimat Inhale 2 puffs into the lungs 4 (four) times daily as needed for wheezing. 09/11/17   [provider]  isosorbide mononitrate (IMDUR) 30 MG 24 hr tablet Take 30 mg by mouth daily. 08/17/18   [provider]  lidocaine-prilocaine (EMLA) cream Apply 1 application topically daily as needed (1-2 hours before dialysis (cover with occlusive dressing)).  09/03/18   [provider]  metoprolol tartrate (LOPRESSOR) 100 MG tablet Take 1  tablet (100 mg total) by mouth 2 (two) times daily. 01/26/19   Little Ishikawa, MD  nitroGLYCERIN (NITROSTAT) 0.4 MG SL tablet Place 0.4 mg under the tongue every 5 (five) minutes as needed for chest pain.  09/18/16   [provider]  ondansetron (ZOFRAN) 4 MG tablet Take 4 mg by mouth 3 (three) times daily as needed for nausea. 09/14/18   [provider]  pantoprazole (PROTONIX) 40 MG tablet Take 1 tablet (40 mg total) by mouth daily. Patient taking differently: Take 40 mg by mouth daily as needed (acid reflux/indigestion.).  08/11/18   Hongalgi,  Lenis Dickinson, MD  polyethylene glycol (MIRALAX / GLYCOLAX) 17 g packet Take 17 g by mouth 2 (two) times daily. Patient taking differently: Take 17 g by mouth every other day.  08/11/18   Hongalgi, Lenis Dickinson, MD  pravastatin (PRAVACHOL) 80 MG tablet Take 80 mg by mouth every evening.  06/30/16   [provider]  sennosides-docusate sodium (SENOKOT-S) 8.6-50 MG tablet Take 1 tablet by mouth 2 (two) times daily.     [provider]  SYMBICORT 160-4.5 MCG/ACT inhaler Inhale 2 puffs into the lungs every 4 (four) hours as needed (for wheezing).  09/16/18   [provider]    Allergies    Cyclobenzaprine, Morphine, Penicillins, Ambien [zolpidem], Codeine, and Hydromorphone  Review of Systems   Review of Systems  Constitutional: Negative for appetite change and fatigue.  HENT: Negative for congestion, ear discharge and sinus pressure.   Eyes: Negative for discharge.  Respiratory: Negative for cough.   Cardiovascular: Negative for chest pain.  Gastrointestinal: Positive for abdominal pain. Negative for diarrhea.  Genitourinary: Negative for frequency and hematuria.  Musculoskeletal: Negative for back pain.  Skin: Negative for rash.  Neurological: Negative for seizures and headaches.  Psychiatric/Behavioral: Negative for hallucinations.    Physical Exam Updated Vital Signs BP 136/90   Pulse 84   Temp 97.7 F (36.5  C) (Oral)   Resp (!) 24   SpO2 100%   Physical Exam Vitals and nursing note reviewed.  Constitutional:      Appearance: She is well-developed.  HENT:     Head: Normocephalic.     Nose: Nose normal.  Eyes:     General: No scleral icterus.    Conjunctiva/sclera: Conjunctivae normal.  Neck:     Thyroid: No thyromegaly.  Cardiovascular:     Rate and Rhythm: Normal rate and regular rhythm.     Heart sounds: No murmur. No friction rub. No gallop.   Pulmonary:     Breath sounds: No stridor. No wheezing or rales.  Chest:     Chest wall: No tenderness.  Abdominal:     General: There is no distension.     Tenderness: There is no rebound.  Musculoskeletal:        General: Normal range of motion.     Cervical back: Neck supple.  Lymphadenopathy:     Cervical: No cervical adenopathy.  Skin:    Findings: No erythema or rash.  Neurological:     Mental Status: She is alert and oriented to person, place, and time.     Motor: No abnormal muscle tone.     Coordination: Coordination normal.  Psychiatric:        Behavior: Behavior normal.     ED Results / Procedures / Treatments   Labs (all labs ordered are listed, but only abnormal results are displayed) Labs Reviewed  LIPASE, BLOOD - Abnormal; Notable for the following components:      Result Value   Lipase 70 (*)    All other components within normal limits  COMPREHENSIVE METABOLIC PANEL - Abnormal; Notable for the following components:   Chloride 97 (*)    CO2 18 (*)    Glucose, Bld 107 (*)    BUN 83 (*)    Creatinine, Ser 7.79 (*)    Calcium 8.3 (*)    Albumin 3.2 (*)    AST 14 (*)    GFR calc non Af Amer 5 (*)    GFR calc Af Amer 6 (*)    Anion  gap 20 (*)    All other components within normal limits  CBC - Abnormal; Notable for the following components:   WBC 10.9 (*)    All other components within normal limits  DIFFERENTIAL - Abnormal; Notable for the following components:   Neutro Abs 8.7 (*)    All other  components within normal limits  URINALYSIS, ROUTINE W REFLEX MICROSCOPIC  LACTIC ACID, PLASMA  LACTIC ACID, PLASMA    EKG EKG Interpretation  Date/Time:  Sunday February 27 2019 13:06:40 EST Ventricular Rate:  85 PR Interval:  154 QRS Duration: 76 QT Interval:  406 QTC Calculation: 483 R Axis:   75 Text Interpretation: Normal sinus rhythm Normal ECG Confirmed by Milton Ferguson [phone]) on 02/27/2019 1:58:31 PM   Radiology CT ABDOMEN PELVIS WO CONTRAST  Result Date: 02/27/2019 CLINICAL DATA:  Abdominal distension.  Pain.  Vomiting and diarrhea. EXAM: CT ABDOMEN AND PELVIS WITHOUT CONTRAST TECHNIQUE: Multidetector CT imaging of the abdomen and pelvis was performed following the standard protocol without IV contrast. COMPARISON:  February 21, 2019 FINDINGS: Lower chest: Atelectasis is seen in the left lung base. Lung bases otherwise unremarkable. No other changes in the lower chest. Hepatobiliary: The liver is unremarkable. The gallbladder is poorly distended. There is fat stranding and a small amount of fluid in the right upper quadrant which is thought to arise from the duodenum, described below. This stranding in fluid does abut the gallbladder but the gallbladder is not thought to be the cause. Pancreas: Unremarkable. No pancreatic ductal dilatation or surrounding inflammatory changes. Spleen: Normal in size without focal abnormality. Adrenals/Urinary Tract: Again noted is a left adrenal adenoma measuring 2.6 cm. The right adrenal nodule measuring up to 2.2 cm is indeterminate on this study but has been stable since 2012 consistent with a benign etiology such as an adenoma. Bilateral renal cysts are identified. There is an indeterminate mass exophytic off the posterior left kidney on axial image 25 measuring up to 8 mm, not present on the February 21, 2019 study. Right-sided renal cysts are seen as well. Vascular calcifications are associated with both kidneys. No definite renal calculi. No  hydronephrosis. No ureteral stones or ureterectasis. The bladder is normal. Stomach/Bowel: The stomach is normal. The duodenum is thickened with adjacent fat stranding, involving the second proximal third portions of the duodenum. No extraluminal gas identified. No obvious ulceration. 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 Remainder of the small bowel is unchanged. No small bowel obstruction identified. Moderate fecal loading in the colon. The colon is otherwise unremarkable. The appendix is not seen consistent previous appendectomy. Vascular/Lymphatic: Dense atherosclerosis is seen in the nonaneurysmal tortuous abdominal aorta. Atherosclerotic change extends into the iliac and femoral vessels. No adenopathy. Reproductive: Status post hysterectomy. No adnexal masses. Other: No abdominal wall hernia or abnormality. No abdominopelvic ascites. Musculoskeletal: No acute or significant osseous findings. IMPRESSION: 1. The duodenum is thick walled with adjacent fat stranding and a small amount of fluid. The findings are consistent with duodenal inflammation which is often seen with peptic ulcer disease. Infectious and other inflammatory causes are other possibilities. Recommend clinical correlation. No extraluminal gas or ulceration is seen on this study. 2. There is an indeterminate 8 mm mass exophytic off the left kidney, not present February 21, 2019. Neoplasm is unlikely given the short interval and a hemorrhagic or complicated cyst is most likely. An MRI could further assess as an outpatient. 3. Adrenal adenomas, unchanged. 4. Moderate fecal loading in  the colon. 5. Significant atherosclerotic changes in the aorta, iliac vessels, and femoral vessels. Electronically Signed   By: Dorise Bullion III M.D   On: 02/27/2019 14:54    Procedures Procedures (including critical care time)  Medications Ordered in ED Medications  sodium chloride flush (NS) 0.9 % injection 3 mL (3  mLs Intravenous Not Given 02/27/19 1519)  sodium chloride 0.9 % bolus 250 mL (250 mLs Intravenous New Bag/Given 02/27/19 1519)  pantoprazole (PROTONIX) injection 40 mg (has no administration in time range)  Chlorhexidine Gluconate Cloth 2 % PADS 6 each (has no administration in time range)  calcitRIOL (ROCALTROL) capsule 0.75 mcg (has no administration in time range)  calcitRIOL (ROCALTROL) capsule 0.75 mcg (has no administration in time range)  ondansetron (ZOFRAN) injection 4 mg (4 mg Intravenous Given 02/27/19 1516)  morphine 4 MG/ML injection 4 mg (4 mg Intravenous Given 02/27/19 1517)    ED Course  I have reviewed the triage vital signs and the nursing notes.  Pertinent labs & imaging results that were available during my care of the patient were reviewed by me and considered in my medical decision making (see chart for details).    MDM Rules/Calculators/A&P                     Patient with duodenitis with vomiting and diarrhea abdominal pain.  She will be admitted to medicine.  Patient will also be seen by nephrology and get dialysis done Final Clinical Impression(s) / ED Diagnoses Final diagnoses:  Pain of upper abdomen    Rx / DC Orders ED Discharge Orders    None       Milton Ferguson, MD 02/27/19 [phone]

## 2019-02-28 DIAGNOSIS — E875 Hyperkalemia: Secondary | ICD-10-CM | POA: Diagnosis present

## 2019-02-28 DIAGNOSIS — Z66 Do not resuscitate: Secondary | ICD-10-CM | POA: Diagnosis present

## 2019-02-28 DIAGNOSIS — N186 End stage renal disease: Secondary | ICD-10-CM | POA: Diagnosis present

## 2019-02-28 DIAGNOSIS — R109 Unspecified abdominal pain: Secondary | ICD-10-CM | POA: Diagnosis not present

## 2019-02-28 DIAGNOSIS — F319 Bipolar disorder, unspecified: Secondary | ICD-10-CM | POA: Diagnosis present

## 2019-02-28 DIAGNOSIS — Z888 Allergy status to other drugs, medicaments and biological substances status: Secondary | ICD-10-CM | POA: Diagnosis not present

## 2019-02-28 DIAGNOSIS — I120 Hypertensive chronic kidney disease with stage 5 chronic kidney disease or end stage renal disease: Secondary | ICD-10-CM | POA: Diagnosis present

## 2019-02-28 DIAGNOSIS — J449 Chronic obstructive pulmonary disease, unspecified: Secondary | ICD-10-CM | POA: Diagnosis present

## 2019-02-28 DIAGNOSIS — D649 Anemia, unspecified: Secondary | ICD-10-CM | POA: Diagnosis present

## 2019-02-28 DIAGNOSIS — Z992 Dependence on renal dialysis: Secondary | ICD-10-CM | POA: Diagnosis not present

## 2019-02-28 DIAGNOSIS — Z955 Presence of coronary angioplasty implant and graft: Secondary | ICD-10-CM | POA: Diagnosis not present

## 2019-02-28 DIAGNOSIS — N2581 Secondary hyperparathyroidism of renal origin: Secondary | ICD-10-CM | POA: Diagnosis present

## 2019-02-28 DIAGNOSIS — Z885 Allergy status to narcotic agent status: Secondary | ICD-10-CM | POA: Diagnosis not present

## 2019-02-28 DIAGNOSIS — Z7982 Long term (current) use of aspirin: Secondary | ICD-10-CM | POA: Diagnosis not present

## 2019-02-28 DIAGNOSIS — K219 Gastro-esophageal reflux disease without esophagitis: Secondary | ICD-10-CM | POA: Diagnosis present

## 2019-02-28 DIAGNOSIS — Z880 Allergy status to penicillin: Secondary | ICD-10-CM | POA: Diagnosis not present

## 2019-02-28 DIAGNOSIS — K2980 Duodenitis without bleeding: Secondary | ICD-10-CM | POA: Diagnosis present

## 2019-02-28 DIAGNOSIS — I480 Paroxysmal atrial fibrillation: Secondary | ICD-10-CM | POA: Diagnosis present

## 2019-02-28 DIAGNOSIS — Z9115 Patient's noncompliance with renal dialysis: Secondary | ICD-10-CM | POA: Diagnosis not present

## 2019-02-28 DIAGNOSIS — R1010 Upper abdominal pain, unspecified: Secondary | ICD-10-CM | POA: Diagnosis present

## 2019-02-28 DIAGNOSIS — Z20822 Contact with and (suspected) exposure to covid-19: Secondary | ICD-10-CM | POA: Diagnosis present

## 2019-02-28 DIAGNOSIS — I2510 Atherosclerotic heart disease of native coronary artery without angina pectoris: Secondary | ICD-10-CM | POA: Diagnosis present

## 2019-02-28 DIAGNOSIS — Z79899 Other long term (current) drug therapy: Secondary | ICD-10-CM | POA: Diagnosis not present

## 2019-02-28 DIAGNOSIS — K279 Peptic ulcer, site unspecified, unspecified as acute or chronic, without hemorrhage or perforation: Secondary | ICD-10-CM | POA: Diagnosis present

## 2019-02-28 DIAGNOSIS — R1013 Epigastric pain: Secondary | ICD-10-CM | POA: Diagnosis present

## 2019-02-28 LAB — URINALYSIS, ROUTINE W REFLEX MICROSCOPIC
Bilirubin Urine: NEGATIVE
Glucose, UA: NEGATIVE mg/dL
Hgb urine dipstick: NEGATIVE
Ketones, ur: NEGATIVE mg/dL
Nitrite: NEGATIVE
Protein, ur: 100 mg/dL — AB
Specific Gravity, Urine: 1.009 (ref 1.005–1.030)
pH: 7 (ref 5.0–8.0)

## 2019-02-28 LAB — CBC
HCT: 31.9 % — ABNORMAL LOW (ref 36.0–46.0)
Hemoglobin: 10.4 g/dL — ABNORMAL LOW (ref 12.0–15.0)
MCH: 30.7 pg (ref 26.0–34.0)
MCHC: 32.6 g/dL (ref 30.0–36.0)
MCV: 94.1 fL (ref 80.0–100.0)
Platelets: 242 10*3/uL (ref 150–400)
RBC: 3.39 MIL/uL — ABNORMAL LOW (ref 3.87–5.11)
RDW: 15.1 % (ref 11.5–15.5)
WBC: 8.7 10*3/uL (ref 4.0–10.5)
nRBC: 0 % (ref 0.0–0.2)

## 2019-02-28 LAB — BASIC METABOLIC PANEL
Anion gap: 15 (ref 5–15)
BUN: 83 mg/dL — ABNORMAL HIGH (ref 8–23)
CO2: 19 mmol/L — ABNORMAL LOW (ref 22–32)
Calcium: 7.8 mg/dL — ABNORMAL LOW (ref 8.9–10.3)
Chloride: 102 mmol/L (ref 98–111)
Creatinine, Ser: 8.4 mg/dL — ABNORMAL HIGH (ref 0.44–1.00)
GFR calc Af Amer: 5 mL/min — ABNORMAL LOW (ref >60)
GFR calc non Af Amer: 4 mL/min — ABNORMAL LOW (ref >60)
Glucose, Bld: 67 mg/dL — ABNORMAL LOW (ref 70–99)
Potassium: 5.3 mmol/L — ABNORMAL HIGH (ref 3.5–5.1)
Sodium: 136 mmol/L (ref 135–145)

## 2019-02-28 MED ORDER — HEPARIN SODIUM (PORCINE) 1000 UNIT/ML IJ SOLN
INTRAMUSCULAR | Status: AC
  Filled 2019-02-28: qty 4

## 2019-02-28 NOTE — Progress Notes (Signed)
Renal Navigator met with patient at bedside to follow up on our conversation from admission last week. Navigator inquired as to why she did not go to OP HD since discharge last Wednesday. She states her transportation did not pick her up on Thursday, and is adamant that she called on Wednesday to report hospital discharge, and that her stomach hurt too badly to go on Saturday. Navigator asked for permission to call [facility] on her behalf and also spoke with patient about a plan for HD tomorrow as well as moving her to second shift permanently to see if this will help with compliance. Patient told Dr. Arlyss Gandy that she would like a second shift seat at OP HD clinic.  Renal Navigator spoke with OP HD clinic/[facility] Manager K. Brothers who has given patient an 11:30am seat for tomorrow and tentatively moved patient to 11:15am starting Thursday, 03/03/19. Renal Navigator spoke with [facility] Transportation Director Velna Ochs who confirmed that they can start transporting patient on new OP HD schedule starting 2/4. Navigator also contacted [employer], who transports patient on Saturdays, to let them know of the change in schedule. They agree to the change also, and state they will be to her home for pick up at 10:45am. Renal Navigator informed patient, who was very appreciative, and stressed the importance of notifying her transportation companies of her hospital admissions and discharges.  Navigator has scheduled patient for OP HD tomorrow at 11:30am. If she is still in the hospital, Renal Navigator can assist with transportation to appointment, if needed, and asks that she is assessed early in order to facilitate early discharge. Patient's daughter can pick her up from OP HD clinic after treatment tomorrow.  Alphonzo Cruise, [place] Renal Navigator [phone]

## 2019-02-28 NOTE — Progress Notes (Signed)
[facility] PROGRESS NOTE  Christina [name] CBJ:[id number] DOB: 05-18-1950 DOA: 02/27/2019 PCP: Maggie Schwalbe, PA-C  Assessment/Plan:  #1. Abdominal pain/nausea and vomiting likely related to volume overload and uremia as a result of missing dialysis.  Resolved this morning she completed 100% of her breakfast without pain or nausea.  CT of abdomen pelvis reveals duodenum thick walled with adjacent fat stranding and a small amount of fluids consistent with duodenal inflammation often seen with peptic ulcer disease, indeterminate 36mm mass exophytic off the left kidney not present 3 weeks ago, per report neoplasm unlikely given the short interval.  Recommend MRI as an outpatient, moderate fecal loading in the colon. -Nephrology for dialysis  #2.  End-stage renal disease.  She is a Tuesday Thursday Saturday dialysis person.  She was just discharged from the hospital a week ago and did not go to dialysis for her 2 days.  She states she cannot get up early in the morning and has requested later outpatient dialysis appointment. -Appreciate nephrology -Dialysis today and then tomorrow -Discharge after dialysis today or before dialysis outpatient tomorrow  #3.  Hypertension.  Blood pressures at the high end of normal.  Home medications include, amlodipine, Lasix, metoprolol, imdur.  Dialysis will likely improve blood pressure.  Of note it looks like Lasix is for nondialysis days only -Continue home meds -Monitor  #4.  History of A. fib.  EKG reveals normal sinus rhythm.  Include metoprolol. -Continue beta-blocker  5.  CAD status post stents.  Denies chest pain.  No events on telemetry.  EKG as noted above.  Home medications include Imdur. -Continue home meds  #6.  Anemia.  Likely related to chronic disease.  Hemoglobin 10.4.  No sign symptoms of active bleeding -Monitor  #7.  Hyperkalemia.  Potassium 5.4 this morning.  Likely related to no dialysis in 1 week. -Dialysis today per  nephrology -Monitor on telemetry -Recheck in the morning  Code Status: dnr Family Communication: patient Disposition Plan: home when ready after dialysis today or before dialysis tomorrow   Consultants:  Johnney Ou nephrology  Procedures:  Dialysis 02/28/19  Antibiotics:    HPI/Subjective: Awake alert no acute distress.  Denies abdominal pain or nausea.  Completed 100% of her breakfast  Objective: Vitals:   02/28/19 0748 02/28/19 0931  BP:  (!) 159/81  Pulse:  67  Resp:  18  Temp:  98.3 F (36.8 C)  SpO2: 94% 97%    Intake/Output Summary (Last 24 hours) at 02/28/2019 1309 Last data filed at 02/28/2019 1607 Gross per 24 hour  Intake 480 ml  Output 0 ml  Net 480 ml   Filed Weights   02/27/19 1811 02/27/19 2103  Weight: 48.3 kg 47.9 kg    Exam:   General: Well-nourished appears chronically ill very poor dentition no acute distress  Cardiovascular: Regular rate and rhythm no murmur gallop or rub no lower extremity edema  Respiratory: Normal effort breath sounds are clear bilaterally I hear no crackles no wheeze  Abdomen: Nondistended soft positive bowel sounds throughout mild diffuse tenderness to palpation.  No guarding or rebounding  Musculoskeletal: Joints without swelling/erythema moves all extremities spontaneously with full range of motion  Data Reviewed: Basic Metabolic Panel: Recent Labs  Lab 02/22/19 1118 02/23/19 0808 02/27/19 1315 02/28/19 0208  NA  --  137 135 136  K  --  4.1 5.1 5.3*  CL  --  95* 97* 102  CO2  --  26 18* 19*  GLUCOSE  --  104*  107* 67*  BUN  --  24* 83* 83*  CREATININE  --  4.15* 7.79* 8.40*  CALCIUM  --  9.1 8.3* 7.8*  PHOS 5.9* 5.0*  --   --    Liver Function Tests: Recent Labs  Lab 02/23/19 0808 02/27/19 1315  AST  --  14*  ALT  --  10  ALKPHOS  --  68  BILITOT  --  0.7  PROT  --  6.7  ALBUMIN 3.1* 3.2*   Recent Labs  Lab 02/23/19 0808 02/27/19 1315  LIPASE 40 70*   No results for input(s): AMMONIA in  the last 168 hours. CBC: Recent Labs  Lab 02/21/19 1621 02/27/19 1315 02/28/19 0208  WBC 8.3 10.9* 8.7  NEUTROABS  --  8.7*  --   HGB 11.5* 12.1 10.4*  HCT 35.5* 37.3 31.9*  MCV 95.2 94.4 94.1  PLT 280 240 242   Cardiac Enzymes: No results for input(s): CKTOTAL, CKMB, CKMBINDEX, TROPONINI in the last 168 hours. BNP (last 3 results) Recent Labs    01/24/19 2228 02/04/19 0103  BNP 1,570.0* 4,197.2*    ProBNP (last 3 results) No results for input(s): PROBNP in the last 8760 hours.  CBG: No results for input(s): GLUCAP in the last 168 hours.  Recent Results (from the past 240 hour(s))  Respiratory Panel by RT PCR (Flu A&B, Covid) - Nasopharyngeal Swab     Status: None   Collection Time: 02/21/19 12:36 PM   Specimen: Nasopharyngeal Swab  Result Value Ref Range Status   SARS Coronavirus 2 by RT PCR NEGATIVE NEGATIVE Final    Comment: (NOTE) SARS-CoV-2 target nucleic acids are NOT DETECTED. The SARS-CoV-2 RNA is generally detectable in upper respiratoy specimens during the acute phase of infection. The lowest concentration of SARS-CoV-2 viral copies this assay can detect is 131 copies/mL. A negative result does not preclude SARS-Cov-2 infection and should not be used as the sole basis for treatment or other patient management decisions. A negative result may occur with  improper specimen collection/handling, submission of specimen other than nasopharyngeal swab, presence of viral mutation(s) within the areas targeted by this assay, and inadequate number of viral copies (<131 copies/mL). A negative result must be combined with clinical observations, patient history, and epidemiological information. The expected result is Negative. Fact Sheet for Patients:  PinkCheek.be Fact Sheet for Healthcare Providers:  GravelBags.it This test is not yet ap proved or cleared by the [place] FDA and  has been authorized for  detection and/or diagnosis of SARS-CoV-2 by FDA under an Emergency Use Authorization (EUA). This EUA will remain  in effect (meaning this test can be used) for the duration of the COVID-19 declaration under Section 564(b)(1) of the Act, 21 U.S.C. section 360bbb-3(b)(1), unless the authorization is terminated or revoked sooner.    Influenza A by PCR NEGATIVE NEGATIVE Final   Influenza B by PCR NEGATIVE NEGATIVE Final    Comment: (NOTE) The Xpert Xpress SARS-CoV-2/FLU/RSV assay is intended as an aid in  the diagnosis of influenza from Nasopharyngeal swab specimens and  should not be used as a sole basis for treatment. Nasal washings and  aspirates are unacceptable for Xpert Xpress SARS-CoV-2/FLU/RSV  testing. Fact Sheet for Patients: PinkCheek.be Fact Sheet for Healthcare Providers: GravelBags.it This test is not yet approved or cleared by the [place] FDA and  has been authorized for detection and/or diagnosis of SARS-CoV-2 by  FDA under an Emergency Use Authorization (EUA). This EUA will remain  in effect (meaning  this test can be used) for the duration of the  Covid-19 declaration under Section 564(b)(1) of the Act, 21  U.S.C. section 360bbb-3(b)(1), unless the authorization is  terminated or revoked. Performed at [facility], [place] [address] [place], [place] [address]   Urine culture     Status: Abnormal   Collection Time: 02/21/19  2:40 PM   Specimen: Urine, Random  Result Value Ref Range Status   Specimen Description URINE, RANDOM  Final   Special Requests   Final    NONE Performed at [facility], [place] [address], [place], [place] [address]    Culture MULTIPLE SPECIES PRESENT, SUGGEST RECOLLECTION (A)  Final   Report Status 02/22/2019 FINAL  Final  SARS CORONAVIRUS 2 (TAT 6-24 HRS)     Status: None   Collection Time: 02/27/19  4:52 PM  Result Value Ref Range Status   SARS Coronavirus 2 NEGATIVE  NEGATIVE Final    Comment: (NOTE) SARS-CoV-2 target nucleic acids are NOT DETECTED. The SARS-CoV-2 RNA is generally detectable in upper and lower respiratory specimens during the acute phase of infection. Negative results do not preclude SARS-CoV-2 infection, do not rule out co-infections with other pathogens, and should not be used as the sole basis for treatment or other patient management decisions. Negative results must be combined with clinical observations, patient history, and epidemiological information. The expected result is Negative. Fact Sheet for Patients: SugarRoll.be Fact Sheet for Healthcare Providers: https://www.woods-mathews.com/ This test is not yet approved or cleared by the [place] FDA and  has been authorized for detection and/or diagnosis of SARS-CoV-2 by FDA under an Emergency Use Authorization (EUA). This EUA will remain  in effect (meaning this test can be used) for the duration of the COVID-19 declaration under Section 56 4(b)(1) of the Act, 21 U.S.C. section 360bbb-3(b)(1), unless the authorization is terminated or revoked sooner. Performed at [facility], [place] [address], [place], [place] [address]      Studies: CT ABDOMEN PELVIS WO CONTRAST  Result Date: 02/27/2019 CLINICAL DATA:  Abdominal distension.  Pain.  Vomiting and diarrhea. EXAM: CT ABDOMEN AND PELVIS WITHOUT CONTRAST TECHNIQUE: Multidetector CT imaging of the abdomen and pelvis was performed following the standard protocol without IV contrast. COMPARISON:  February 21, 2019 FINDINGS: Lower chest: Atelectasis is seen in the left lung base. Lung bases otherwise unremarkable. No other changes in the lower chest. Hepatobiliary: The liver is unremarkable. The gallbladder is poorly distended. There is fat stranding and a small amount of fluid in the right upper quadrant which is thought to arise from the duodenum, described below. This stranding in  fluid does abut the gallbladder but the gallbladder is not thought to be the cause. Pancreas: Unremarkable. No pancreatic ductal dilatation or surrounding inflammatory changes. Spleen: Normal in size without focal abnormality. Adrenals/Urinary Tract: Again noted is a left adrenal adenoma measuring 2.6 cm. The right adrenal nodule measuring up to 2.2 cm is indeterminate on this study but has been stable since 2012 consistent with a benign etiology such as an adenoma. Bilateral renal cysts are identified. There is an indeterminate mass exophytic off the posterior left kidney on axial image 25 measuring up to 8 mm, not present on the February 21, 2019 study. Right-sided renal cysts are seen as well. Vascular calcifications are associated with both kidneys. No definite renal calculi. No hydronephrosis. No ureteral stones or ureterectasis. The bladder is normal. Stomach/Bowel: The stomach is normal. The duodenum is thickened with adjacent fat  stranding, involving the second proximal third portions of the duodenum. No extraluminal gas identified. No obvious ulceration. 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 Remainder of the small bowel is unchanged. No small bowel obstruction identified. Moderate fecal loading in the colon. The colon is otherwise unremarkable. The appendix is not seen consistent previous appendectomy. Vascular/Lymphatic: Dense atherosclerosis is seen in the nonaneurysmal tortuous abdominal aorta. Atherosclerotic change extends into the iliac and femoral vessels. No adenopathy. Reproductive: Status post hysterectomy. No adnexal masses. Other: No abdominal wall hernia or abnormality. No abdominopelvic ascites. Musculoskeletal: No acute or significant osseous findings. IMPRESSION: 1. The duodenum is thick walled with adjacent fat stranding and a small amount of fluid. The findings are consistent with duodenal inflammation which is often seen with peptic ulcer  disease. Infectious and other inflammatory causes are other possibilities. Recommend clinical correlation. No extraluminal gas or ulceration is seen on this study. 2. There is an indeterminate 8 mm mass exophytic off the left kidney, not present February 21, 2019. Neoplasm is unlikely given the short interval and a hemorrhagic or complicated cyst is most likely. An MRI could further assess as an outpatient. 3. Adrenal adenomas, unchanged. 4. Moderate fecal loading in the colon. 5. Significant atherosclerotic changes in the aorta, iliac vessels, and femoral vessels. Electronically Signed   By: Dorise Bullion III M.D   On: 02/27/2019 14:54    Scheduled Meds: . allopurinol  100 mg Oral Daily  . amLODipine  10 mg Oral Daily  . aspirin EC  81 mg Oral Daily  . [START ON 03/01/2019] calcitRIOL  0.75 mcg Oral Q T,Th,Sa-HD  . Chlorhexidine Gluconate Cloth  6 each Topical Q0600  . cholecalciferol  2,000 Units Oral Daily  . dicyclomine  20 mg Oral QHS  . docusate sodium  100 mg Oral BID  . ferric citrate  420 mg Oral With snacks  . ferric citrate  630 mg Oral TID WC  . [START ON 03/01/2019] furosemide  40 mg Oral Q Tue  . [START ON 03/05/2019] furosemide  40 mg Oral Q Sat  . [START ON 03/03/2019] furosemide  40 mg Oral Q Thu  . gabapentin  300 mg Oral QHS  . heparin  5,000 Units Subcutaneous Q8H  . isosorbide mononitrate  30 mg Oral Daily  . metoprolol tartrate  100 mg Oral BID  . mometasone-formoterol  2 puff Inhalation BID  . pantoprazole  40 mg Oral BID AC  . polyethylene glycol  17 g Oral QODAY  . pravastatin  80 mg Oral QPM  . senna-docusate  1 tablet Oral BID  . sodium chloride flush  3 mL Intravenous Once   Continuous Infusions: . [START ON 03/03/2019] ferric gluconate (FERRLECIT/NULECIT) IV      Principal Problem:   Abdominal pain Active Problems:   ESRD (end stage renal disease) (HCC)   Uremia   Hyperkalemia   CAD (coronary artery disease), native coronary artery   COPD with chronic  bronchitis (HCC)   Essential hypertension   Afib (HCC)   Bipolar affective ([place])    Time spent: 15 minutes    [facility] M NP  [facility] If 7PM-7AM, please contact night-coverage at [email or web address] 02/28/2019, 1:09 PM  LOS: 0 days

## 2019-02-28 NOTE — Evaluation (Signed)
Physical Therapy Evaluation Patient Details Name: Christina [name] MRN: [id number] DOB: 11-Apr-1950 Today's Date: 02/28/2019   History of Present Illness  69yo female brought to the ED via EMS due to acute SOB. Agitated and uncooperative in the ED, intubated 02/03/18 and extubated 02/04/18. Covid negative in ED. Admitted for acute respiratory insufficiency. PMH SBO, HTN, HLD, COPD, CKD, A-fib, cardiac cath  Clinical Impression  Pt was seen for mobility and does own some adaptive devices, which she is not using.  Gave agreement to using them during eval with OT 2.5 weeks ago but currently does not report needing them.  Has involved family, as her daughters live near and ck on her daily.  Will ck on her again if she remains in hosp for several days but currently is safely able to walk with no AD and up ad lib in her room.  Follow up as needed for her gait quality.    Follow Up Recommendations No PT follow up    Equipment Recommendations  None recommended by PT    Recommendations for Other Services       Precautions / Restrictions Precautions Precautions: Fall Precaution Comments: using a wider based gait Restrictions Weight Bearing Restrictions: No      Mobility  Bed Mobility Overal bed mobility: Modified Independent                Transfers Overall transfer level: Modified independent Equipment used: None                Ambulation/Gait Ambulation/Gait assistance: Min guard Gait Distance (Feet): 250 Feet Assistive device: None Gait Pattern/deviations: Step-through pattern;Wide base of support Gait velocity: functional Gait velocity interpretation: <1.31 ft/sec, indicative of household ambulator General Gait Details: strenght is grossly wfl on BLE's with gait widened as a habit  Stairs Stairs: (pt declined, no steps in her house)          Wheelchair Mobility    Modified Rankin (Stroke Patients Only)       Balance Overall balance assessment: Needs  assistance Sitting-balance support: Feet supported Sitting balance-Leahy Scale: Good     Standing balance support: No upper extremity supported Standing balance-Leahy Scale: Fair                               Pertinent Vitals/Pain Pain Assessment: No/denies pain    Home Living Family/patient expects to be discharged to:: Private residence Living Arrangements: Alone Available Help at Discharge: Available PRN/intermittently;Family Type of Home: Apartment Home Access: Level entry     Home Layout: One level Home Equipment: [place] - 2 wheels;Cane - single point Additional Comments: reports she has not recently used any devices    Prior Function Level of Independence: Independent               Hand Dominance   Dominant Hand: Right    Extremity/Trunk Assessment   Upper Extremity Assessment Upper Extremity Assessment: Overall WFL for tasks assessed    Lower Extremity Assessment Lower Extremity Assessment: Overall WFL for tasks assessed    Cervical / Trunk Assessment Cervical / Trunk Assessment: Normal  Communication   Communication: No difficulties  Cognition Arousal/Alertness: Awake/alert Behavior During Therapy: WFL for tasks assessed/performed Overall Cognitive Status: Within Functional Limits for tasks assessed  General Comments: pt is on room air with no SOB      General Comments General comments (skin integrity, edema, etc.): pt is walking wiht no help, able to stand with no assist from lower height seats but also has wider based gait     Exercises     Assessment/Plan    PT Assessment Patient needs continued PT services  PT Problem List Decreased range of motion;Decreased knowledge of use of DME;Decreased safety awareness       PT Treatment Interventions DME instruction;Balance training;Gait training;Stair training;Functional mobility training;Therapeutic activities;Patient/family  education;Therapeutic exercise    PT Goals (Current goals can be found in the Care Plan section)  Acute Rehab PT Goals Patient Stated Goal: go home PT Goal Formulation: With patient Time For Goal Achievement: 03/14/19 Potential to Achieve Goals: Good    Frequency Min 3X/week   Barriers to discharge Decreased caregiver support home alone    Co-evaluation               AM-PAC PT "6 Clicks" Mobility  Outcome Measure Help needed turning from your back to your side while in a flat bed without using bedrails?: None Help needed moving from lying on your back to sitting on the side of a flat bed without using bedrails?: None Help needed moving to and from a bed to a chair (including a wheelchair)?: None Help needed standing up from a chair using your arms (e.g., wheelchair or bedside chair)?: A Little Help needed to walk in hospital room?: A Little Help needed climbing 3-5 steps with a railing? : A Little 6 Click Score: 21    End of Session Equipment Utilized During Treatment: Gait belt Activity Tolerance: Patient tolerated treatment well Patient left: in chair;with call bell/phone within reach Nurse Communication: Mobility status PT Visit Diagnosis: Other abnormalities of gait and mobility (R26.89)    Time: 6384-5364 PT Time Calculation (min) (ACUTE ONLY): 12 min   Charges:   PT Evaluation $PT Eval Moderate Complexity: 1 Mod         Ramond Dial 02/28/2019, 4:43 PM   Mee Hives, PT MS Acute Rehab Dept. Number: [place] [facility] [place]

## 2019-02-28 NOTE — Progress Notes (Signed)
Subjective:  Cos of some Abd discomfort ,for HD today not able last pm 2/2 emergent cases/  Objective Vital signs in last 24 hours: Vitals:   02/27/19 2103 02/28/19 0448 02/28/19 0748 02/28/19 0931  BP: (!) 183/70 (!) 178/81  (!) 159/81  Pulse: 84 65  67  Resp: 18 18  18   Temp: 98.1 F (36.7 C) 98.2 F (36.8 C)  98.3 F (36.8 C)  TempSrc: Oral Oral  Oral  SpO2: 95% 93% 94% 97%  Weight: 47.9 kg     Height:       Weight change:   Physical Exam: General: alert thin female nad  Heart: RRR Lungs: CTA , unlabored breathing  Abdomen: BS pos, Tender epigastric and LL quad ,  Extremities:no pedal edema   Dialysis Access: LUA AVF  Pos. Bruit . Merwyn Katos cath   Dialysis Orders: [facility] -[facility] 4hrs, [id number], DFRAF1.5, EDW 47kg,2K/2Ca  Access:LUAVF (11/24/18 insert , Ok to use per [facility] 02/02/19 OV ), R IJ TDC Heparinnone [email or web address] - last 02/17/19 - has been d/c Venofer 50mg  qHD Calcitriol0.30mcg PO qHD   Problem/Plan:: 1. Abdominal pain - ?PUD per CT, uremia, other, per primary 2. ESRD -TTS  last HD during prior admission -  HD today then tomorrow  Has been using 1 or 2needles with AVF - continue to titrate up as tolerated.Given lower BFR with AVF - should run 3.5 hours - and then she needs HD again Tuesday.  3. Anemia - hgb 12.1> 10.4  - no ESA -  4. Secondary hyperparathyroidism - cont meds 5. HTN/volume - continue same meds from d/c EDW to 47 , lasix on nonHD days 6. Nutrition  Renal diet with fluid restirciton  7. DNR/DNI last admission 8. afib - on BB , not AC canidate 2/2 GI Bleed ho  9. COPD 10. CAD s/p stents 11. Nonadherence to dialysis - continue to address barriers to attendance 12. Substance abuse / ho bipolar    Ernest Haber, PA-C [facility] [phone] 02/28/2019,9:49 AM  LOS: 0 days   Labs: Basic Metabolic Panel: Recent Labs  Lab 02/22/19 1118 02/23/19 0808 02/27/19 1315 02/28/19 0208  NA  --  137 135 136  K  --  4.1  5.1 5.3*  CL  --  95* 97* 102  CO2  --  26 18* 19*  GLUCOSE  --  104* 107* 67*  BUN  --  24* 83* 83*  CREATININE  --  4.15* 7.79* 8.40*  CALCIUM  --  9.1 8.3* 7.8*  PHOS 5.9* 5.0*  --   --    Liver Function Tests: Recent Labs  Lab 02/23/19 0808 02/27/19 1315  AST  --  14*  ALT  --  10  ALKPHOS  --  68  BILITOT  --  0.7  PROT  --  6.7  ALBUMIN 3.1* 3.2*   Recent Labs  Lab 02/23/19 0808 02/27/19 1315  LIPASE 40 70*   No results for input(s): AMMONIA in the last 168 hours. CBC: Recent Labs  Lab 02/21/19 1621 02/27/19 1315 02/28/19 0208  WBC 8.3 10.9* 8.7  NEUTROABS  --  8.7*  --   HGB 11.5* 12.1 10.4*  HCT 35.5* 37.3 31.9*  MCV 95.2 94.4 94.1  PLT 280 240 242   Cardiac Enzymes: No results for input(s): CKTOTAL, CKMB, CKMBINDEX, TROPONINI in the last 168 hours. CBG: No results for input(s): GLUCAP in the last 168 hours.  Studies/Results: CT ABDOMEN PELVIS WO CONTRAST  Result  Date: 02/27/2019 CLINICAL DATA:  Abdominal distension.  Pain.  Vomiting and diarrhea. EXAM: CT ABDOMEN AND PELVIS WITHOUT CONTRAST TECHNIQUE: Multidetector CT imaging of the abdomen and pelvis was performed following the standard protocol without IV contrast. COMPARISON:  February 21, 2019 FINDINGS: Lower chest: Atelectasis is seen in the left lung base. Lung bases otherwise unremarkable. No other changes in the lower chest. Hepatobiliary: The liver is unremarkable. The gallbladder is poorly distended. There is fat stranding and a small amount of fluid in the right upper quadrant which is thought to arise from the duodenum, described below. This stranding in fluid does abut the gallbladder but the gallbladder is not thought to be the cause. Pancreas: Unremarkable. No pancreatic ductal dilatation or surrounding inflammatory changes. Spleen: Normal in size without focal abnormality. Adrenals/Urinary Tract: Again noted is a left adrenal adenoma measuring 2.6 cm. The right adrenal nodule measuring up to  2.2 cm is indeterminate on this study but has been stable since 2012 consistent with a benign etiology such as an adenoma. Bilateral renal cysts are identified. There is an indeterminate mass exophytic off the posterior left kidney on axial image 25 measuring up to 8 mm, not present on the February 21, 2019 study. Right-sided renal cysts are seen as well. Vascular calcifications are associated with both kidneys. No definite renal calculi. No hydronephrosis. No ureteral stones or ureterectasis. The bladder is normal. Stomach/Bowel: The stomach is normal. The duodenum is thickened with adjacent fat stranding, involving the second proximal third portions of the duodenum. No extraluminal gas identified. No obvious ulceration. 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 Remainder of the small bowel is unchanged. No small bowel obstruction identified. Moderate fecal loading in the colon. The colon is otherwise unremarkable. The appendix is not seen consistent previous appendectomy. Vascular/Lymphatic: Dense atherosclerosis is seen in the nonaneurysmal tortuous abdominal aorta. Atherosclerotic change extends into the iliac and femoral vessels. No adenopathy. Reproductive: Status post hysterectomy. No adnexal masses. Other: No abdominal wall hernia or abnormality. No abdominopelvic ascites. Musculoskeletal: No acute or significant osseous findings. IMPRESSION: 1. The duodenum is thick walled with adjacent fat stranding and a small amount of fluid. The findings are consistent with duodenal inflammation which is often seen with peptic ulcer disease. Infectious and other inflammatory causes are other possibilities. Recommend clinical correlation. No extraluminal gas or ulceration is seen on this study. 2. There is an indeterminate 8 mm mass exophytic off the left kidney, not present February 21, 2019. Neoplasm is unlikely given the short interval and a hemorrhagic or complicated cyst is most  likely. An MRI could further assess as an outpatient. 3. Adrenal adenomas, unchanged. 4. Moderate fecal loading in the colon. 5. Significant atherosclerotic changes in the aorta, iliac vessels, and femoral vessels. Electronically Signed   By: Dorise Bullion III M.D   On: 02/27/2019 14:54   Medications: . [START ON 03/03/2019] ferric gluconate (FERRLECIT/NULECIT) IV     . allopurinol  100 mg Oral Daily  . amLODipine  10 mg Oral Daily  . aspirin EC  81 mg Oral Daily  . [START ON 03/01/2019] calcitRIOL  0.75 mcg Oral Q T,Th,Sa-HD  . Chlorhexidine Gluconate Cloth  6 each Topical Q0600  . cholecalciferol  2,000 Units Oral Daily  . dicyclomine  20 mg Oral QHS  . docusate sodium  100 mg Oral BID  . ferric citrate  420 mg Oral With snacks  . ferric citrate  630 mg Oral TID WC  . [  START ON 03/01/2019] furosemide  40 mg Oral Q Tue  . [START ON 03/05/2019] furosemide  40 mg Oral Q Sat  . [START ON 03/03/2019] furosemide  40 mg Oral Q Thu  . gabapentin  300 mg Oral QHS  . heparin  5,000 Units Subcutaneous Q8H  . isosorbide mononitrate  30 mg Oral Daily  . metoprolol tartrate  100 mg Oral BID  . mometasone-formoterol  2 puff Inhalation BID  . pantoprazole  40 mg Oral BID AC  . polyethylene glycol  17 g Oral QODAY  . pravastatin  80 mg Oral QPM  . senna-docusate  1 tablet Oral BID  . sodium chloride flush  3 mL Intravenous Once

## 2019-03-01 DIAGNOSIS — N186 End stage renal disease: Secondary | ICD-10-CM

## 2019-03-01 DIAGNOSIS — Z9115 Patient's noncompliance with renal dialysis: Secondary | ICD-10-CM

## 2019-03-01 LAB — CBC
HCT: 32.6 % — ABNORMAL LOW (ref 36.0–46.0)
Hemoglobin: 10.4 g/dL — ABNORMAL LOW (ref 12.0–15.0)
MCH: 30.1 pg (ref 26.0–34.0)
MCHC: 31.9 g/dL (ref 30.0–36.0)
MCV: 94.5 fL (ref 80.0–100.0)
Platelets: 228 10*3/uL (ref 150–400)
RBC: 3.45 MIL/uL — ABNORMAL LOW (ref 3.87–5.11)
RDW: 15.2 % (ref 11.5–15.5)
WBC: 7.4 10*3/uL (ref 4.0–10.5)
nRBC: 0 % (ref 0.0–0.2)

## 2019-03-01 LAB — BASIC METABOLIC PANEL
Anion gap: 11 (ref 5–15)
BUN: 26 mg/dL — ABNORMAL HIGH (ref 8–23)
CO2: 24 mmol/L (ref 22–32)
Calcium: 7.8 mg/dL — ABNORMAL LOW (ref 8.9–10.3)
Chloride: 100 mmol/L (ref 98–111)
Creatinine, Ser: 3.99 mg/dL — ABNORMAL HIGH (ref 0.44–1.00)
GFR calc Af Amer: 13 mL/min — ABNORMAL LOW (ref >60)
GFR calc non Af Amer: 11 mL/min — ABNORMAL LOW (ref >60)
Glucose, Bld: 106 mg/dL — ABNORMAL HIGH (ref 70–99)
Potassium: 4 mmol/L (ref 3.5–5.1)
Sodium: 135 mmol/L (ref 135–145)

## 2019-03-01 MED ORDER — FUROSEMIDE 40 MG PO TABS: 40.0000 mg | ORAL_TABLET | ORAL | 0 refills | Status: DC

## 2019-03-01 MED ORDER — FUROSEMIDE 40 MG PO TABS: ORAL_TABLET | ORAL | 0 refills | Status: DC

## 2019-03-01 MED ORDER — HEPARIN SODIUM (PORCINE) 1000 UNIT/ML IJ SOLN
INTRAMUSCULAR | Status: AC
  Filled 2019-03-01: qty 4

## 2019-03-01 NOTE — Progress Notes (Signed)
Subjective:  Seen on HD this AM - planning on D/C later today.  AVF was running well with 17g 243mL/min but she moved arm and venous needle infiltrated --> now using TDC.   Objective Vital signs in last 24 hours: Vitals:   03/01/19 0730 03/01/19 0800 03/01/19 0830 03/01/19 0900  BP: (!) 138/55 (!) 119/53 (!) 174/67 127/64  Pulse: 69 71 70 69  Resp: 14 15 15 14   Temp:      TempSrc:      SpO2:      Weight:      Height:       Weight change: -0.1 kg  Physical Exam: General: alert thin female nad  Heart: RRR Lungs: CTA , unlabored breathing  Extremities:no pedal edema   Dialysis Access: LUA AVF  Pos. Bruit - ice pack in place for infiltration . Merwyn Katos cath   Dialysis Orders: TTS -[facility] 4hrs, [id number], DFRAF1.5, EDW 47kg,2K/2Ca  Access:LUAVF (11/24/18 insert , Ok to use per [facility]VS 02/02/19 OV ), R IJ TDC Heparinnone [email or web address] - last 02/17/19 - has been d/c Venofer 50mg  qHD Calcitriol0.58mcg PO qHD   Problem/Plan:: 1. Abdominal pain - ?PUD per CT, uremia, other, per primary 2. ESRD -TTS  outpt, had HD 2/1 and now 2/2 to make up for missed tx.  AVF was running ok but infiltrated due to moved arm, ok to use at next tx.   3. Anemia - hgb 12.1> 10.4  - no ESA -  4. Secondary hyperparathyroidism - cont meds 5. HTN/volume - continue same meds from d/c EDW to 47 , lasix on nonHD days 6. Nutrition  Renal diet with fluid restirciton  7. DNR/DNI last admission 8. afib - on BB , not AC canidate 2/2 GI Bleed ho  9. COPD 10. CAD s/p stents 11. Nonadherence to dialysis - continue to address barriers to attendance --> working on transportation and move to 2nd shift. 12. Substance abuse / ho bipolar   Jannifer Hick MD [facility] Pager [phone]  Labs: Basic Metabolic Panel: Recent Labs  Lab 02/22/19 1118 02/23/19 0808 02/23/19 0808 02/27/19 1315 02/28/19 0208 03/01/19 0609  NA  --  137   < > 135 136 135  K  --  4.1   < > 5.1 5.3* 4.0  CL  --  95*    < > 97* 102 100  CO2  --  26   < > 18* 19* 24  GLUCOSE  --  104*   < > 107* 67* 106*  BUN  --  24*   < > 83* 83* 26*  CREATININE  --  4.15*   < > 7.79* 8.40* 3.99*  CALCIUM  --  9.1   < > 8.3* 7.8* 7.8*  PHOS 5.9* 5.0*  --   --   --   --    < > = values in this interval not displayed.   Liver Function Tests: Recent Labs  Lab 02/23/19 0808 02/27/19 1315  AST  --  14*  ALT  --  10  ALKPHOS  --  68  BILITOT  --  0.7  PROT  --  6.7  ALBUMIN 3.1* 3.2*   Recent Labs  Lab 02/23/19 0808 02/27/19 1315  LIPASE 40 70*   No results for input(s): AMMONIA in the last 168 hours. CBC: Recent Labs  Lab 02/27/19 1315 02/28/19 0208 03/01/19 0609  WBC 10.9* 8.7 7.4  NEUTROABS 8.7*  --   --   HGB 12.1  10.4* 10.4*  HCT 37.3 31.9* 32.6*  MCV 94.4 94.1 94.5  PLT 240 242 228   Cardiac Enzymes: No results for input(s): CKTOTAL, CKMB, CKMBINDEX, TROPONINI in the last 168 hours. CBG: No results for input(s): GLUCAP in the last 168 hours.  Studies/Results: CT ABDOMEN PELVIS WO CONTRAST  Result Date: 02/27/2019 CLINICAL DATA:  Abdominal distension.  Pain.  Vomiting and diarrhea. EXAM: CT ABDOMEN AND PELVIS WITHOUT CONTRAST TECHNIQUE: Multidetector CT imaging of the abdomen and pelvis was performed following the standard protocol without IV contrast. COMPARISON:  February 21, 2019 FINDINGS: Lower chest: Atelectasis is seen in the left lung base. Lung bases otherwise unremarkable. No other changes in the lower chest. Hepatobiliary: The liver is unremarkable. The gallbladder is poorly distended. There is fat stranding and a small amount of fluid in the right upper quadrant which is thought to arise from the duodenum, described below. This stranding in fluid does abut the gallbladder but the gallbladder is not thought to be the cause. Pancreas: Unremarkable. No pancreatic ductal dilatation or surrounding inflammatory changes. Spleen: Normal in size without focal abnormality. Adrenals/Urinary Tract:  Again noted is a left adrenal adenoma measuring 2.6 cm. The right adrenal nodule measuring up to 2.2 cm is indeterminate on this study but has been stable since 2012 consistent with a benign etiology such as an adenoma. Bilateral renal cysts are identified. There is an indeterminate mass exophytic off the posterior left kidney on axial image 25 measuring up to 8 mm, not present on the February 21, 2019 study. Right-sided renal cysts are seen as well. Vascular calcifications are associated with both kidneys. No definite renal calculi. No hydronephrosis. No ureteral stones or ureterectasis. The bladder is normal. Stomach/Bowel: The stomach is normal. The duodenum is thickened with adjacent fat stranding, involving the second proximal third portions of the duodenum. No extraluminal gas identified. No obvious ulceration. 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 Remainder of the small bowel is unchanged. No small bowel obstruction identified. Moderate fecal loading in the colon. The colon is otherwise unremarkable. The appendix is not seen consistent previous appendectomy. Vascular/Lymphatic: Dense atherosclerosis is seen in the nonaneurysmal tortuous abdominal aorta. Atherosclerotic change extends into the iliac and femoral vessels. No adenopathy. Reproductive: Status post hysterectomy. No adnexal masses. Other: No abdominal wall hernia or abnormality. No abdominopelvic ascites. Musculoskeletal: No acute or significant osseous findings. IMPRESSION: 1. The duodenum is thick walled with adjacent fat stranding and a small amount of fluid. The findings are consistent with duodenal inflammation which is often seen with peptic ulcer disease. Infectious and other inflammatory causes are other possibilities. Recommend clinical correlation. No extraluminal gas or ulceration is seen on this study. 2. There is an indeterminate 8 mm mass exophytic off the left kidney, not present February 21, 2019. Neoplasm is unlikely given the short interval and a hemorrhagic or complicated cyst is most likely. An MRI could further assess as an outpatient. 3. Adrenal adenomas, unchanged. 4. Moderate fecal loading in the colon. 5. Significant atherosclerotic changes in the aorta, iliac vessels, and femoral vessels. Electronically Signed   By: Dorise Bullion III M.D   On: 02/27/2019 14:54   Medications: . [START ON 03/03/2019] ferric gluconate (FERRLECIT/NULECIT) IV     . allopurinol  100 mg Oral Daily  . amLODipine  10 mg Oral Daily  . aspirin EC  81 mg Oral Daily  . calcitRIOL  0.75 mcg Oral Q T,Th,Sa-HD  . Chlorhexidine Gluconate Cloth  6 each Topical Q0600  . cholecalciferol  2,000 Units Oral Daily  . dicyclomine  20 mg Oral QHS  . docusate sodium  100 mg Oral BID  . ferric citrate  420 mg Oral With snacks  . ferric citrate  630 mg Oral TID WC  . furosemide  40 mg Oral Q Tue  . [START ON 03/05/2019] furosemide  40 mg Oral Q Sat  . [START ON 03/03/2019] furosemide  40 mg Oral Q Thu  . gabapentin  300 mg Oral QHS  . heparin      . heparin  5,000 Units Subcutaneous Q8H  . isosorbide mononitrate  30 mg Oral Daily  . metoprolol tartrate  100 mg Oral BID  . mometasone-formoterol  2 puff Inhalation BID  . pantoprazole  40 mg Oral BID AC  . polyethylene glycol  17 g Oral QODAY  . pravastatin  80 mg Oral QPM  . senna-docusate  1 tablet Oral BID  . sodium chloride flush  3 mL Intravenous Once

## 2019-03-01 NOTE — Progress Notes (Signed)
Renal Navigator notes that patient was already taken for HD first shift this morning. OP HD spot therefore canceled at HD clinic.  Renal Navigator attempted to speak with patient upon her return from HD to remind her of the importance to contact her transportation company at discharge, but she was sleeping and did not rouse with knock at door. Renal Navigator requested of bedside RN that patient be reminded if discharged today.   Alphonzo Cruise, [place] [facility] [phone]

## 2019-03-01 NOTE — Progress Notes (Signed)
Christina [name] to be discharged Home per MD order. Discussed prescriptions and follow up appointments with the patient. Prescriptions given to patient; medication list explained in detail. Patient verbalized understanding.  Skin clean, dry and intact without evidence of skin break down, no evidence of skin tears noted. IV catheter discontinued intact. Site without signs and symptoms of complications. Dressing and pressure applied. Pt denies pain at the site currently. No complaints noted.  Patient free of lines, drains, and wounds.   An After Visit Summary (AVS) was printed and given to the patient. Patient escorted via wheelchair, and discharged home via private auto.  [name], RN

## 2019-03-01 NOTE — Progress Notes (Signed)
Hemodialysis completed. UF 1L without issue. Patient tolerated well. Remains confused at times. Notified primary RN of infiltration and use of ice. Patient left unit in stable condition.

## 2019-03-01 NOTE — Progress Notes (Signed)
Hemodialysis initiated via L AVF using 17g needles x2 without issue. BFR currently 250. Arterial pressure -230. Will increase to 300bfr if tolerated. Patient instructed to keep access uncovered at all times during treatment. Alert to self and place. Confused at times.

## 2019-03-01 NOTE — Progress Notes (Addendum)
Patient bending access arm. Venous needle infiltrated. Patient does not have capacity to keep access arm still. Remains in and out of confusion. Unable to cannulate higher. Switched to catheter use. Needles pulled. Ice applied to arm.

## 2019-03-01 NOTE — Discharge Summary (Addendum)
Physician Discharge Summary  Christina [name]:[id number] DOB: Oct 06, 1950 DOA: 02/27/2019  PCP: Maggie Schwalbe, PA-C  Admit date: 02/27/2019 Discharge date: 03/01/2019  Admitted From: 02/27/19 Discharge disposition: 03/01/19   Recommendations for Outpatient Follow-Up:   Attend dialysis scheduled each and every time that it scheduled Follow-up with nephrology as scheduled Take medications as prescribed   Discharge Diagnosis:   Principal Problem:   Abdominal pain Active Problems:   ESRD (end stage renal disease) ([place])   Bipolar affective ([place])   CAD (coronary artery disease), native coronary artery   COPD with chronic bronchitis ([place])   Essential hypertension   Afib ([place])   Uremia   Hyperkalemia    Discharge Condition: Improved.  Diet recommendation: Renal  Wound care: None.  Code status: Full.   History of Present Illness:   Christina [name] is a 69 y.o. female with medical history significant of SBO; HTN; HLD; depression; CAD with stents; COPD; ESRD on TTS HD; and afib presented 02/27/19 with abdominal pain after several missed HD treatments x3.  She was just recently hospitalized for similar reason.  Her regular HD schedule is TTS, she was discharged from hospital on this past Tuesday, she did not go to her Tuesday dialysis.  On Thursday she said she felt very weak and did not go.  On Saturday, she said she went to another appointment, and missed dialysis.  She then stated that her appointments are too early in the morning and she cannot get up laterally. Then the evening prior to presentation, after eating dinner, she started to feel severe sharp like epigastric pain, localized cramping like, 8-9 over 10, episodic, associated with more than 10 times of vomiting with stomach content, nonbilious nonbloody.  Along with mild episode of loose bowel movement.  Denied any fever chills, no chest pain. ED Course: CT showed inflammation of duodenum implying peptic  ulcer   Hospital Course by Problem:   #1. Abdominal pain/nausea and vomiting likely related to volume overload and uremia as a result of missing dialysis.  Resolved after dialysis x2. She completed 100% of her breakfast without pain or nausea.  CT of abdomen pelvis reveals duodenum thick walled with adjacent fat stranding and a small amount of fluids consistent with duodenal inflammation often seen with peptic ulcer disease, indeterminate 80mm mass exophytic off the left kidney not present 3 weeks ago, per report neoplasm unlikely given the short interval.  Recommend MRI as an outpatient, moderate fecal loading in the colon. Nephrology for dialysis   #2.  End-stage renal disease.  She is a Tuesday Thursday Saturday dialysis person.  She was just discharged from the hospital a week ago and did not go to dialysis for her 2 days.  She stated she cannot get up early in the morning and has requested later outpatient dialysis appointment. These arrangements were made per nephrology.   #3.  Hypertension.  Blood pressures at the high end of normal on presentation.  Home medications include, amlodipine, Lasix, metoprolol, imdur.  Dialysis improved blood pressure.  resumed home meds. Lasix changed to tues and sat.   #4.  History of A. fib.  EKG revealed normal sinus rhythm.     5.  CAD status post stents.  Denied chest pain.  No events on telemetry.  EKG as noted above.  Home medications include Imdur.   #6.  Anemia.  Likely related to chronic disease.  Hemoglobin 10.4.  No sign symptoms of active bleeding   #  7.  Hyperkalemia.  Potassium 4.0 on day of discharge.    Medical Consultants:   [facility] neph[facility]   Discharge Exam:   Vitals:   03/01/19 1013 03/01/19 1102  BP: (!) 144/55 (!) 152/55  Pulse: 76 75  Resp: 16 16  Temp: 97.9 F (36.6 C) 98.9 F (37.2 C)  SpO2: 96% 93%   Vitals:   03/01/19 1000 03/01/19 1006 03/01/19 1013 03/01/19 1102  BP: 129/61 129/61 (!) 144/55 (!) 152/55  Pulse: 73  73 76 75  Resp: 14 15 16 16   Temp:   97.9 F (36.6 C) 98.9 F (37.2 C)  TempSrc:   Oral Oral  SpO2:   96% 93%  Weight:   46.2 kg   Height:        General exam: Appears calm and comfortable.  Respiratory system: Clear to auscultation. Respiratory effort normal. Cardiovascular system: S1 & S2 heard, RRR. No JVD,  rubs, gallops or clicks. No murmurs. Gastrointestinal system: Abdomen is nondistended, soft and nontender. No organomegaly or masses felt. Normal bowel sounds heard. Central nervous system: Alert and oriented. No focal neurological deficits. Extremities: No clubbing,  or cyanosis. No edema. Skin: No rashes, lesions or ulcers. Psychiatry: Judgement and insight appear normal. Mood & affect appropriate.    The results of significant diagnostics from this hospitalization (including imaging, microbiology, ancillary and laboratory) are listed below for reference.     Procedures and Diagnostic Studies:   CT ABDOMEN PELVIS WO CONTRAST  Result Date: 02/27/2019 CLINICAL DATA:  Abdominal distension.  Pain.  Vomiting and diarrhea. EXAM: CT ABDOMEN AND PELVIS WITHOUT CONTRAST TECHNIQUE: Multidetector CT imaging of the abdomen and pelvis was performed following the standard protocol without IV contrast. COMPARISON:  February 21, 2019 FINDINGS: Lower chest: Atelectasis is seen in the left lung base. Lung bases otherwise unremarkable. No other changes in the lower chest. Hepatobiliary: The liver is unremarkable. The gallbladder is poorly distended. There is fat stranding and a small amount of fluid in the right upper quadrant which is thought to arise from the duodenum, described below. This stranding in fluid does abut the gallbladder but the gallbladder is not thought to be the cause. Pancreas: Unremarkable. No pancreatic ductal dilatation or surrounding inflammatory changes. Spleen: Normal in size without focal abnormality. Adrenals/Urinary Tract: Again noted is a left adrenal adenoma measuring  2.6 cm. The right adrenal nodule measuring up to 2.2 cm is indeterminate on this study but has been stable since 2012 consistent with a benign etiology such as an adenoma. Bilateral renal cysts are identified. There is an indeterminate mass exophytic off the posterior left kidney on axial image 25 measuring up to 8 mm, not present on the February 21, 2019 study. Right-sided renal cysts are seen as well. Vascular calcifications are associated with both kidneys. No definite renal calculi. No hydronephrosis. No ureteral stones or ureterectasis. The bladder is normal. Stomach/Bowel: The stomach is normal. The duodenum is thickened with adjacent fat stranding, involving the second proximal third portions of the duodenum. No extraluminal gas identified. No obvious ulceration. 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 Remainder of the small bowel is unchanged. No small bowel obstruction identified. Moderate fecal loading in the colon. The colon is otherwise unremarkable. The appendix is not seen consistent previous appendectomy. Vascular/Lymphatic: Dense atherosclerosis is seen in the nonaneurysmal tortuous abdominal aorta. Atherosclerotic change extends into the iliac and femoral vessels. No adenopathy. Reproductive: Status post hysterectomy. No adnexal masses. Other: No abdominal wall  hernia or abnormality. No abdominopelvic ascites. Musculoskeletal: No acute or significant osseous findings. IMPRESSION: 1. The duodenum is thick walled with adjacent fat stranding and a small amount of fluid. The findings are consistent with duodenal inflammation which is often seen with peptic ulcer disease. Infectious and other inflammatory causes are other possibilities. Recommend clinical correlation. No extraluminal gas or ulceration is seen on this study. 2. There is an indeterminate 8 mm mass exophytic off the left kidney, not present February 21, 2019. Neoplasm is unlikely given the short  interval and a hemorrhagic or complicated cyst is most likely. An MRI could further assess as an outpatient. 3. Adrenal adenomas, unchanged. 4. Moderate fecal loading in the colon. 5. Significant atherosclerotic changes in the aorta, iliac vessels, and femoral vessels. Electronically Signed   By: Dorise Bullion III M.D   On: 02/27/2019 14:54     Labs:   Basic Metabolic Panel: Recent Labs  Lab 02/23/19 5397 02/23/19 6734 02/27/19 1315 02/27/19 1315 02/28/19 0208 03/01/19 0609  NA 137  --  135  --  136 135  K 4.1   < > 5.1   < > 5.3* 4.0  CL 95*  --  97*  --  102 100  CO2 26  --  18*  --  19* 24  GLUCOSE 104*  --  107*  --  67* 106*  BUN 24*  --  83*  --  83* 26*  CREATININE 4.15*  --  7.79*  --  8.40* 3.99*  CALCIUM 9.1  --  8.3*  --  7.8* 7.8*  PHOS 5.0*  --   --   --   --   --    < > = values in this interval not displayed.   GFR Estimated Creatinine Clearance: 9.8 mL/min (A) (by C-G formula based on SCr of 3.99 mg/dL (H)). Liver Function Tests: Recent Labs  Lab 02/23/19 0808 02/27/19 1315  AST  --  14*  ALT  --  10  ALKPHOS  --  68  BILITOT  --  0.7  PROT  --  6.7  ALBUMIN 3.1* 3.2*   Recent Labs  Lab 02/23/19 0808 02/27/19 1315  LIPASE 40 70*   No results for input(s): AMMONIA in the last 168 hours. Coagulation profile No results for input(s): INR, PROTIME in the last 168 hours.  CBC: Recent Labs  Lab 02/27/19 1315 02/28/19 0208 03/01/19 0609  WBC 10.9* 8.7 7.4  NEUTROABS 8.7*  --   --   HGB 12.1 10.4* 10.4*  HCT 37.3 31.9* 32.6*  MCV 94.4 94.1 94.5  PLT 240 242 228   Cardiac Enzymes: No results for input(s): CKTOTAL, CKMB, CKMBINDEX, TROPONINI in the last 168 hours. BNP: Invalid input(s): POCBNP CBG: No results for input(s): GLUCAP in the last 168 hours. D-Dimer No results for input(s): DDIMER in the last 72 hours. Hgb A1c No results for input(s): HGBA1C in the last 72 hours. Lipid Profile No results for input(s): CHOL, HDL, LDLCALC,  TRIG, CHOLHDL, LDLDIRECT in the last 72 hours. Thyroid function studies No results for input(s): TSH, T4TOTAL, T3FREE, THYROIDAB in the last 72 hours.  Invalid input(s): FREET3 Anemia work up No results for input(s): VITAMINB12, FOLATE, FERRITIN, TIBC, IRON, RETICCTPCT in the last 72 hours. Microbiology Recent Results (from the past 240 hour(s))  Respiratory Panel by RT PCR (Flu A&B, Covid) - Nasopharyngeal Swab     Status: None   Collection Time: 02/21/19 12:36 PM   Specimen: Nasopharyngeal Swab  Result  Value Ref Range Status   SARS Coronavirus 2 by RT PCR NEGATIVE NEGATIVE Final    Comment: (NOTE) SARS-CoV-2 target nucleic acids are NOT DETECTED. The SARS-CoV-2 RNA is generally detectable in upper respiratoy specimens during the acute phase of infection. The lowest concentration of SARS-CoV-2 viral copies this assay can detect is 131 copies/mL. A negative result does not preclude SARS-Cov-2 infection and should not be used as the sole basis for treatment or other patient management decisions. A negative result may occur with  improper specimen collection/handling, submission of specimen other than nasopharyngeal swab, presence of viral mutation(s) within the areas targeted by this assay, and inadequate number of viral copies (<131 copies/mL). A negative result must be combined with clinical observations, patient history, and epidemiological information. The expected result is Negative. Fact Sheet for Patients:  PinkCheek.be Fact Sheet for Healthcare Providers:  GravelBags.it This test is not yet ap proved or cleared by the [place] FDA and  has been authorized for detection and/or diagnosis of SARS-CoV-2 by FDA under an Emergency Use Authorization (EUA). This EUA will remain  in effect (meaning this test can be used) for the duration of the COVID-19 declaration under Section 564(b)(1) of the Act, 21 U.S.C. section  360bbb-3(b)(1), unless the authorization is terminated or revoked sooner.    Influenza A by PCR NEGATIVE NEGATIVE Final   Influenza B by PCR NEGATIVE NEGATIVE Final    Comment: (NOTE) The Xpert Xpress SARS-CoV-2/FLU/RSV assay is intended as an aid in  the diagnosis of influenza from Nasopharyngeal swab specimens and  should not be used as a sole basis for treatment. Nasal washings and  aspirates are unacceptable for Xpert Xpress SARS-CoV-2/FLU/RSV  testing. Fact Sheet for Patients: PinkCheek.be Fact Sheet for Healthcare Providers: GravelBags.it This test is not yet approved or cleared by the [place] FDA and  has been authorized for detection and/or diagnosis of SARS-CoV-2 by  FDA under an Emergency Use Authorization (EUA). This EUA will remain  in effect (meaning this test can be used) for the duration of the  Covid-19 declaration under Section 564(b)(1) of the Act, 21  U.S.C. section 360bbb-3(b)(1), unless the authorization is  terminated or revoked. Performed at [facility], [place] [address] [place], [place] [address]   Urine culture     Status: Abnormal   Collection Time: 02/21/19  2:40 PM   Specimen: Urine, Random  Result Value Ref Range Status   Specimen Description URINE, RANDOM  Final   Special Requests   Final    NONE Performed at [facility], [place] [address], [place], [place] [address]    Culture MULTIPLE SPECIES PRESENT, SUGGEST RECOLLECTION (A)  Final   Report Status 02/22/2019 FINAL  Final  SARS CORONAVIRUS 2 (TAT 6-24 HRS)     Status: None   Collection Time: 02/27/19  4:52 PM  Result Value Ref Range Status   SARS Coronavirus 2 NEGATIVE NEGATIVE Final    Comment: (NOTE) SARS-CoV-2 target nucleic acids are NOT DETECTED. The SARS-CoV-2 RNA is generally detectable in upper and lower respiratory specimens during the acute phase of infection. Negative results do not preclude SARS-CoV-2  infection, do not rule out co-infections with other pathogens, and should not be used as the sole basis for treatment or other patient management decisions. Negative results must be combined with clinical observations, patient history, and epidemiological information. The expected result is Negative. Fact Sheet for Patients: SugarRoll.be Fact Sheet for Healthcare Providers: https://www.woods-mathews.com/ This test is not yet approved  or cleared by the [place] and  has been authorized for detection and/or diagnosis of SARS-CoV-2 by FDA under an Emergency Use Authorization (EUA). This EUA will remain  in effect (meaning this test can be used) for the duration of the COVID-19 declaration under Section 56 4(b)(1) of the Act, 21 U.S.C. section 360bbb-3(b)(1), unless the authorization is terminated or revoked sooner. Performed at [facility], [place] [address], [place], [place] [address]      Discharge Instructions:   Discharge Instructions     Call MD for:  persistant dizziness or light-headedness   Complete by: As directed    Call MD for:  severe uncontrolled pain   Complete by: As directed    Call MD for:  temperature >100.4   Complete by: As directed    Diet - low sodium heart healthy   Complete by: As directed    Discharge instructions   Complete by: As directed    Attend dialysis as scheduled each time scheduled   Increase activity slowly   Complete by: As directed       Allergies as of 03/01/2019       Reactions   Cyclobenzaprine Anaphylaxis, Other (See Comments)   "stopped heart"   Morphine Anaphylaxis   "stopped heart"   Penicillins Shortness Of Breath, Swelling, Palpitations   Did it involve swelling of the face/tongue/throat, SOB, or low BP? Yes Did it involve sudden or severe rash/hives, skin peeling, or any reaction on the inside of your mouth or nose? No Did you need to seek medical attention at a  hospital or doctor's office? No When did it last happen?      years  If all above answers are "NO", may proceed with cephalosporin use.   Ambien [zolpidem] Other (See Comments)   Severe confusion   Codeine Itching, Rash   Hydromorphone Other (See Comments)   If administered quickly, felt like hand was "exploding"         Medication List     TAKE these medications    albuterol (2.5 MG/3ML) 0.083% nebulizer solution Commonly known as: PROVENTIL Take 2.5 mg by nebulization every 6 (six) hours as needed for wheezing or shortness of breath.   allopurinol 100 MG tablet Commonly known as: ZYLOPRIM Take 100 mg by mouth daily.   amLODipine 10 MG tablet Commonly known as: NORVASC Take 10 mg by mouth daily.   aspirin EC 81 MG tablet Take 81 mg by mouth daily.   Auryxia 1 GM 210 MG(Fe) tablet Generic drug: ferric citrate Take 420 mg by mouth See admin instructions. Take 3 tablets (630 mg) by mouth with each meal & take 2 tablets (420 mg) by mouth with each snack   dicyclomine 10 MG capsule Commonly known as: BENTYL Take 20 mg by mouth at bedtime.   docusate sodium 100 MG capsule Commonly known as: COLACE Take 1 capsule (100 mg total) by mouth 2 (two) times daily.   doxylamine (Sleep) 25 MG tablet Commonly known as: UNISOM Take 25 mg by mouth at bedtime as needed for sleep.   furosemide 40 MG tablet Commonly known as: LASIX 40 mg lasix on non-HD days What changed:  medication strength how much to take how to take this when to take this additional instructions   gabapentin 300 MG capsule Commonly known as: NEURONTIN Take 1 capsule (300 mg total) by mouth at bedtime.   Ipratropium-Albuterol 20-100 MCG/ACT Aers respimat Commonly known as: COMBIVENT Inhale 2 puffs into the  lungs 4 (four) times daily as needed for wheezing.   isosorbide mononitrate 30 MG 24 hr tablet Commonly known as: IMDUR Take 30 mg by mouth daily.   lidocaine-prilocaine cream Commonly known  as: EMLA Apply 1 application topically daily as needed (1-2 hours before dialysis (cover with occlusive dressing)).   LUBRICATING EYE DROPS OP Place 1 drop into both eyes daily as needed (dry eyes).   metoprolol tartrate 100 MG tablet Commonly known as: LOPRESSOR Take 1 tablet (100 mg total) by mouth 2 (two) times daily.   nitroGLYCERIN 0.4 MG SL tablet Commonly known as: NITROSTAT Place 0.4 mg under the tongue every 5 (five) minutes as needed for chest pain.   ondansetron 4 MG tablet Commonly known as: ZOFRAN Take 4 mg by mouth 3 (three) times daily as needed for nausea.   pantoprazole 40 MG tablet Commonly known as: PROTONIX Take 1 tablet (40 mg total) by mouth daily. What changed:  when to take this reasons to take this   polyethylene glycol 17 g packet Commonly known as: MIRALAX / GLYCOLAX Take 17 g by mouth 2 (two) times daily. What changed: when to take this   pravastatin 80 MG tablet Commonly known as: PRAVACHOL Take 80 mg by mouth every evening.   sennosides-docusate sodium 8.6-50 MG tablet Commonly known as: SENOKOT-S Take 1 tablet by mouth 2 (two) times daily.   Symbicort 160-4.5 MCG/ACT inhaler Generic drug: budesonide-formoterol Inhale 2 puffs into the lungs every 4 (four) hours as needed (for wheezing).   Vitamin D3 50 MCG (2000 UT) capsule Take 2,000 Units by mouth daily.           Time coordinating discharge: 45 minutes  Signed:  Dyanne Carrel NP  [facility] 03/01/2019, 2:16 PM

## 2019-03-02 ENCOUNTER — Encounter: Payer: Self-pay | Admitting: Surgery

## 2019-03-02 ENCOUNTER — Encounter (HOSPITAL_COMMUNITY): Payer: Medicare Other

## 2019-03-02 ENCOUNTER — Ambulatory Visit: Payer: Medicare Other

## 2019-03-02 NOTE — Progress Notes (Deleted)
[facility] [place] HISTORY AND PHYSICAL   History of Present Illness:  Patient is a 69 y.o. year old female who presents for evaluation of claudication. She has a history of peripheral vascular disease with pain in both legs after walking approximately 10 feet.  She says the pain stops with rest.    She believes it has been approximately 1 year since her last ABIs.  Examination of her feet reveals thin shiny skin, her forefeet are hyperemic without history of non healing ulcers.   She does take a daily Statin and Asprin.  She has a history of cigarette smoking.  History of Afib currently in NSR and not on anticoagulation.   Past Medical History:  Diagnosis Date  . Anemia   . Anxiety   . Arthritis   . Asthma   . Atrial fibrillation ([place])   . Chronic kidney disease    Dialysis T/Th/Sa  started in March 2020  . COPD (chronic obstructive pulmonary disease) ([place])   . Coronary artery disease    2 stents  . Depression   . GERD (gastroesophageal reflux disease)   . Gout   . Headache    migraines  . History of kidney stones   . Hyperlipidemia   . Hypertension   . Pneumonia   . Small bowel obstruction [facility])     Past Surgical History:  Procedure Laterality Date  . ABDOMINAL HYSTERECTOMY    . ABDOMINAL SURGERY     for small bowel obstruction - x 2  . APPENDECTOMY    . AV FISTULA PLACEMENT Left 08/04/2018   Procedure: ARTERIOVENOUS (AV) FISTULA CREATION LEFT ARM;  Surgeon: Waynetta Sandy, MD;  Location: [place];  Service: Vascular;  Laterality: Left;  . BASCILIC VEIN TRANSPOSITION Left 11/24/2018   Procedure: SECOND STAGE BASILIC VEIN TRANSPOSITION LEFT ARM;  Surgeon: Waynetta Sandy, MD;  Location: [place];  Service: Vascular;  Laterality: Left;  . CARDIAC CATHETERIZATION    . CORONARY ANGIOPLASTY  ?2003/2004  . FACIAL RECONSTRUCTION SURGERY     x 2  . HERNIA REPAIR      ROS:   General:  No weight loss, Fever, chills  HEENT: No recent  headaches, no nasal bleeding, no visual changes, no sore throat  Neurologic: No dizziness, blackouts, seizures. No recent symptoms of stroke or mini- stroke. No recent episodes of slurred speech, or temporary blindness.  Cardiac: No recent episodes of chest pain/pressure, no shortness of breath at rest.  No shortness of breath with exertion.  Denies history of atrial fibrillation or irregular heartbeat  Vascular: No history of rest pain in feet.  No history of claudication.  No history of non-healing ulcer, No history of DVT   Pulmonary: No home oxygen, no productive cough, no hemoptysis,  No asthma or wheezing  Musculoskeletal:  [ ]  Arthritis, [ ]  Low back pain,  [ ]  Joint pain  Hematologic:No history of hypercoagulable state.  No history of easy bleeding.  No history of anemia  Gastrointestinal: No hematochezia or melena,  No gastroesophageal reflux, no trouble swallowing  Urinary: [ ]  chronic Kidney disease, [ ]  on HD - [ ]  MWF or [ ]  TTHS, [ ]  Burning with urination, [ ]  Frequent urination, [ ]  Difficulty urinating;   Skin: No rashes  Psychological: No history of anxiety,  No history of depression  Social History Social History   Tobacco Use  . Smoking status: Former Smoker    Packs/day: 0.50    Years:  50.00    Pack years: 25.00    Quit date: 2019    Years since quitting: 2.0  . Smokeless tobacco: Never Used  Substance Use Topics  . Alcohol use: Not Currently    Comment: quit 2019 - "I was a drunk"  . Drug use: Not Currently    Types: Marijuana    Comment: h/o drug use - "I was raised in the 60s"; last use 18 months ago; smokes marijuana periodically    Family History No family history on file.  Allergies  Allergies  Allergen Reactions  . Cyclobenzaprine Anaphylaxis and Other (See Comments)    "stopped heart"   . Morphine Anaphylaxis    "stopped heart"  . Penicillins Shortness Of Breath, Swelling and Palpitations    Did it involve swelling of the  face/tongue/throat, SOB, or low BP? Yes Did it involve sudden or severe rash/hives, skin peeling, or any reaction on the inside of your mouth or nose? No Did you need to seek medical attention at a hospital or doctor's office? No When did it last happen?years  If all above answers are "NO", may proceed with cephalosporin use.   . Ambien [Zolpidem] Other (See Comments)    Severe confusion  . Codeine Itching and Rash  . Hydromorphone Other (See Comments)    If administered quickly, felt like hand was "exploding"      Current Outpatient Medications  Medication Sig Dispense Refill  . albuterol (PROVENTIL) (2.5 MG/3ML) 0.083% nebulizer solution Take 2.5 mg by nebulization every 6 (six) hours as needed for wheezing or shortness of breath.    . allopurinol (ZYLOPRIM) 100 MG tablet Take 100 mg by mouth daily.    Marland Kitchen amLODipine (NORVASC) 10 MG tablet Take 10 mg by mouth daily.    Marland Kitchen aspirin EC 81 MG tablet Take 81 mg by mouth daily.    Lorin Picket 1 GM 210 MG(Fe) tablet Take 420 mg by mouth See admin instructions. Take 3 tablets (630 mg) by mouth with each meal & take 2 tablets (420 mg) by mouth with each snack    . Carboxymethylcellul-Glycerin (LUBRICATING EYE DROPS OP) Place 1 drop into both eyes daily as needed (dry eyes).    . Cholecalciferol (VITAMIN D3) 50 MCG (2000 UT) capsule Take 2,000 Units by mouth daily.     Marland Kitchen dicyclomine (BENTYL) 10 MG capsule Take 20 mg by mouth at bedtime.     . docusate sodium (COLACE) 100 MG capsule Take 1 capsule (100 mg total) by mouth 2 (two) times daily. 30 capsule 0  . doxylamine, Sleep, (UNISOM) 25 MG tablet Take 25 mg by mouth at bedtime as needed for sleep.    . furosemide (LASIX) 40 MG tablet 40 mg lasix on non-HD days 30 tablet 0  . gabapentin (NEURONTIN) 300 MG capsule Take 1 capsule (300 mg total) by mouth at bedtime.    . Ipratropium-Albuterol (COMBIVENT) 20-100 MCG/ACT AERS respimat Inhale 2 puffs into the lungs 4 (four) times daily as needed for  wheezing.    . isosorbide mononitrate (IMDUR) 30 MG 24 hr tablet Take 30 mg by mouth daily.    Marland Kitchen lidocaine-prilocaine (EMLA) cream Apply 1 application topically daily as needed (1-2 hours before dialysis (cover with occlusive dressing)).     . metoprolol tartrate (LOPRESSOR) 100 MG tablet Take 1 tablet (100 mg total) by mouth 2 (two) times daily. 60 tablet 0  . nitroGLYCERIN (NITROSTAT) 0.4 MG SL tablet Place 0.4 mg under the tongue every 5 (five) minutes  as needed for chest pain.     Marland Kitchen ondansetron (ZOFRAN) 4 MG tablet Take 4 mg by mouth 3 (three) times daily as needed for nausea.    . pantoprazole (PROTONIX) 40 MG tablet Take 1 tablet (40 mg total) by mouth daily. (Patient taking differently: Take 40 mg by mouth daily as needed (acid reflux/indigestion.). )    . polyethylene glycol (MIRALAX / GLYCOLAX) 17 g packet Take 17 g by mouth 2 (two) times daily. (Patient taking differently: Take 17 g by mouth every other day. )    . pravastatin (PRAVACHOL) 80 MG tablet Take 80 mg by mouth every evening.     . sennosides-docusate sodium (SENOKOT-S) 8.6-50 MG tablet Take 1 tablet by mouth 2 (two) times daily.     . SYMBICORT 160-4.5 MCG/ACT inhaler Inhale 2 puffs into the lungs every 4 (four) hours as needed (for wheezing).      No current facility-administered medications for this visit.    Physical Examination  There were no vitals filed for this visit.  There is no height or weight on file to calculate BMI.  General:  Alert and oriented, no acute distress HEENT: Normal Neck: No bruit or JVD Pulmonary: Clear to auscultation bilaterally Cardiac: Regular Rate and Rhythm without murmur Abdomen: Soft, non-tender, non-distended, no mass, no scars Skin: No rash Extremity Pulses:  2+ radial, brachial, femoral, dorsalis pedis, posterior tibial pulses bilaterally Musculoskeletal: No deformity or edema  Neurologic: Upper and lower extremity motor 5/5 and symmetric  DATA: ***   ASSESSMENT:  ***   PLAN: ***   Roxy Horseman PA-C [facility] [place] Office: [phone]  MD on call Early

## 2019-03-17 ENCOUNTER — Encounter (HOSPITAL_COMMUNITY): Payer: Self-pay | Admitting: Internal Medicine

## 2019-03-18 ENCOUNTER — Other Ambulatory Visit: Payer: Self-pay

## 2019-03-18 ENCOUNTER — Emergency Department (HOSPITAL_COMMUNITY): Payer: Medicare Other

## 2019-03-18 ENCOUNTER — Emergency Department (HOSPITAL_COMMUNITY)
Admission: EM | Admit: 2019-03-18 | Discharge: 2019-03-18 | Disposition: A | Discharge status: Home / Self Care | Payer: Medicare Other | Source: Home / Self Care | Attending: Emergency Medicine | Admitting: Emergency Medicine

## 2019-03-18 DIAGNOSIS — J449 Chronic obstructive pulmonary disease, unspecified: Secondary | ICD-10-CM | POA: Insufficient documentation

## 2019-03-18 DIAGNOSIS — R112 Nausea with vomiting, unspecified: Secondary | ICD-10-CM

## 2019-03-18 DIAGNOSIS — Z79899 Other long term (current) drug therapy: Secondary | ICD-10-CM | POA: Insufficient documentation

## 2019-03-18 DIAGNOSIS — Z992 Dependence on renal dialysis: Secondary | ICD-10-CM | POA: Insufficient documentation

## 2019-03-18 DIAGNOSIS — Z7982 Long term (current) use of aspirin: Secondary | ICD-10-CM | POA: Insufficient documentation

## 2019-03-18 DIAGNOSIS — Z87891 Personal history of nicotine dependence: Secondary | ICD-10-CM | POA: Insufficient documentation

## 2019-03-18 DIAGNOSIS — R1011 Right upper quadrant pain: Secondary | ICD-10-CM | POA: Diagnosis not present

## 2019-03-18 DIAGNOSIS — R338 Other retention of urine: Secondary | ICD-10-CM

## 2019-03-18 DIAGNOSIS — I2510 Atherosclerotic heart disease of native coronary artery without angina pectoris: Secondary | ICD-10-CM | POA: Insufficient documentation

## 2019-03-18 DIAGNOSIS — N186 End stage renal disease: Secondary | ICD-10-CM | POA: Insufficient documentation

## 2019-03-18 DIAGNOSIS — I4891 Unspecified atrial fibrillation: Secondary | ICD-10-CM | POA: Insufficient documentation

## 2019-03-18 DIAGNOSIS — K810 Acute cholecystitis: Secondary | ICD-10-CM | POA: Diagnosis not present

## 2019-03-18 DIAGNOSIS — I120 Hypertensive chronic kidney disease with stage 5 chronic kidney disease or end stage renal disease: Secondary | ICD-10-CM | POA: Insufficient documentation

## 2019-03-18 LAB — COMPREHENSIVE METABOLIC PANEL
ALT: 16 U/L (ref 0–44)
AST: 19 U/L (ref 15–41)
Albumin: 3.1 g/dL — ABNORMAL LOW (ref 3.5–5.0)
Alkaline Phosphatase: 65 U/L (ref 38–126)
Anion gap: 18 — ABNORMAL HIGH (ref 5–15)
BUN: 68 mg/dL — ABNORMAL HIGH (ref 8–23)
CO2: 21 mmol/L — ABNORMAL LOW (ref 22–32)
Calcium: 7.7 mg/dL — ABNORMAL LOW (ref 8.9–10.3)
Chloride: 92 mmol/L — ABNORMAL LOW (ref 98–111)
Creatinine, Ser: 6.65 mg/dL — ABNORMAL HIGH (ref 0.44–1.00)
GFR calc Af Amer: 7 mL/min — ABNORMAL LOW (ref >60)
GFR calc non Af Amer: 6 mL/min — ABNORMAL LOW (ref >60)
Glucose, Bld: 145 mg/dL — ABNORMAL HIGH (ref 70–99)
Potassium: 4.7 mmol/L (ref 3.5–5.1)
Sodium: 131 mmol/L — ABNORMAL LOW (ref 135–145)
Total Bilirubin: 0.6 mg/dL (ref 0.3–1.2)
Total Protein: 5.9 g/dL — ABNORMAL LOW (ref 6.5–8.1)

## 2019-03-18 LAB — CBC WITH DIFFERENTIAL/PLATELET
Abs Immature Granulocytes: 0.15 10*3/uL — ABNORMAL HIGH (ref 0.00–0.07)
Basophils Absolute: 0 10*3/uL (ref 0.0–0.1)
Basophils Relative: 0 %
Eosinophils Absolute: 0 10*3/uL (ref 0.0–0.5)
Eosinophils Relative: 0 %
HCT: 25.6 % — ABNORMAL LOW (ref 36.0–46.0)
Hemoglobin: 8.1 g/dL — ABNORMAL LOW (ref 12.0–15.0)
Immature Granulocytes: 2 %
Lymphocytes Relative: 6 %
Lymphs Abs: 0.6 10*3/uL — ABNORMAL LOW (ref 0.7–4.0)
MCH: 30.6 pg (ref 26.0–34.0)
MCHC: 31.6 g/dL (ref 30.0–36.0)
MCV: 96.6 fL (ref 80.0–100.0)
Monocytes Absolute: 0.4 10*3/uL (ref 0.1–1.0)
Monocytes Relative: 4 %
Neutro Abs: 9 10*3/uL — ABNORMAL HIGH (ref 1.7–7.7)
Neutrophils Relative %: 88 %
Platelets: 330 10*3/uL (ref 150–400)
RBC: 2.65 MIL/uL — ABNORMAL LOW (ref 3.87–5.11)
RDW: 17.2 % — ABNORMAL HIGH (ref 11.5–15.5)
WBC: 10.1 10*3/uL (ref 4.0–10.5)
nRBC: 0.2 % (ref 0.0–0.2)

## 2019-03-18 LAB — LACTIC ACID, PLASMA: Lactic Acid, Venous: 1.7 mmol/L (ref 0.5–1.9)

## 2019-03-18 LAB — URINALYSIS, ROUTINE W REFLEX MICROSCOPIC
Bacteria, UA: NONE SEEN
Bilirubin Urine: NEGATIVE
Glucose, UA: 50 mg/dL — AB
Hgb urine dipstick: NEGATIVE
Ketones, ur: NEGATIVE mg/dL
Leukocytes,Ua: NEGATIVE
Nitrite: NEGATIVE
Protein, ur: 30 mg/dL — AB
Specific Gravity, Urine: 1.006 (ref 1.005–1.030)
pH: 7 (ref 5.0–8.0)

## 2019-03-18 LAB — I-STAT CHEM 8, ED
BUN: 65 mg/dL — ABNORMAL HIGH (ref 8–23)
Calcium, Ion: 0.97 mmol/L — ABNORMAL LOW (ref 1.15–1.40)
Chloride: 95 mmol/L — ABNORMAL LOW (ref 98–111)
Creatinine, Ser: 7.5 mg/dL — ABNORMAL HIGH (ref 0.44–1.00)
Glucose, Bld: 139 mg/dL — ABNORMAL HIGH (ref 70–99)
HCT: 27 % — ABNORMAL LOW (ref 36.0–46.0)
Hemoglobin: 9.2 g/dL — ABNORMAL LOW (ref 12.0–15.0)
Potassium: 4.8 mmol/L (ref 3.5–5.1)
Sodium: 130 mmol/L — ABNORMAL LOW (ref 135–145)
TCO2: 26 mmol/L (ref 22–32)

## 2019-03-18 LAB — LIPASE, BLOOD: Lipase: 41 U/L (ref 11–51)

## 2019-03-18 MED ORDER — FENTANYL CITRATE (PF) 100 MCG/2ML IJ SOLN
50.0000 ug | Freq: Once | INTRAMUSCULAR | Status: AC
  Administered 2019-03-18: 50 ug via INTRAVENOUS
  Filled 2019-03-18: qty 2

## 2019-03-18 MED ORDER — TRAMADOL HCL 50 MG PO TABS
50.0000 mg | ORAL_TABLET | Freq: Once | ORAL | Status: AC
  Administered 2019-03-18: 50 mg via ORAL
  Filled 2019-03-18: qty 1

## 2019-03-18 MED ORDER — ONDANSETRON HCL 4 MG/2ML IJ SOLN
4.0000 mg | Freq: Once | INTRAMUSCULAR | Status: AC
  Administered 2019-03-18: 4 mg via INTRAVENOUS
  Filled 2019-03-18: qty 2

## 2019-03-18 NOTE — ED Notes (Signed)
This RN has called pt's [name] [phone]) twice, no answer, voicemail with return number was left.  Pt's sister was also called Ebony Cargo [phone]) & she refused to come get her sister "due to the roads."

## 2019-03-18 NOTE — ED Notes (Signed)
Called ptar for pt

## 2019-03-18 NOTE — ED Triage Notes (Signed)
Pt arrived by [facility] from home r/t abdominal pain. Pt is a dialysis pt and missed today due to road closures. Pt has had abdominal pain for 3 days, with nausea and vomiting starting today. Pt. Threw up 6 times today.Left arm restricted with fistula  and Port on right upper chest.. EMS vitals 144/80 98 % Temp. 98.9 CGB 167. Pain 9 out of 10.

## 2019-03-18 NOTE — ED Provider Notes (Signed)
7:16 AM signout from [employer] at shift change.  Patient waiting on UA.  This resulted and does not show any signs of infection.  Remainder of work-up is reassuring. K okay. No fluid overload. BUN okay.   Patient is a dialysis patient and is due for dialysis tomorrow.  Patient will be discharged home with Foley catheter and leg bag.  Urology follow-up given.  BP 112/86   Pulse 69   Temp (!) 97.5 F (36.4 C) (Oral)   Resp 13   Ht 5\' 2"  (1.575 m)   Wt 47.6 kg   SpO2 99%   BMI 19.20 kg/m      Carlisle Cater, PA-C 03/18/19 [place], April, MD 03/18/19 2356

## 2019-03-18 NOTE — Discharge Instructions (Addendum)
Contact a health care provider if: You have uncomfortable bladder contractions that you cannot control (spasms) or you leak urine with the spasms. Get help right away if: You have chills or fever. You have blood in your urine. You have a catheter and: Your catheter stops draining urine. Your catheter falls out.  Continue frequent small sips (10-20 ml) of clear liquids every 5-10 minutes. Gatorade or powerade are good options. Avoid milk, orange juice, and grape juice for now. . Once you have not had further vomiting with the small sips for 4 hours, you may begin to drink larger volumes of fluids at a time and try a bland diet which may include saltine crackers, applesauce, breads, pastas, bananas, bland chicken. If you continues to vomit despite medication, return to the ED for repeat evaluation. Otherwise, follow up with your doctor in 2-3 days for a re-check.

## 2019-03-18 NOTE — ED Provider Notes (Signed)
[place] EMERGENCY DEPARTMENT Provider Note   CSN: [id number] Arrival date & time: 03/18/19  0047     History Chief Complaint  Patient presents with  . Abdominal Pain    Christina [name] is a 69 y.o. female who presents to the ED with a cc of abdominal pain. She has a pmh  ESRD on HD T/Th/Sa, last dialysis Tuesday (rides to dialysis were cancelled today due to weather event)m she presents with cc of abdominal pain and vomiting. Hx of previous SBO's and abdominal surgery. She has had 3 days of vomiting, diffuse   HPI     Past Medical History:  Diagnosis Date  . Anemia   . Anxiety   . Arthritis   . Asthma   . Atrial fibrillation ([place])   . Chronic kidney disease    Dialysis T/Th/Sa  started in March 2020  . COPD (chronic obstructive pulmonary disease) ([place])   . Coronary artery disease    2 stents  . Depression   . GERD (gastroesophageal reflux disease)   . Gout   . Headache    migraines  . History of kidney stones   . Hyperlipidemia   . Hypertension   . Pneumonia   . Small bowel obstruction [facility])     Patient Active Problem List   Diagnosis Date Noted  . Hyperkalemia 02/28/2019  . Uremia 02/27/2019  . Volume overload 02/21/2019  . Abdominal pain 02/21/2019  . Non-compliance with renal dialysis ([place]) 02/21/2019  . Respiratory failure ([place]) 02/04/2019  . Acute and chr resp failure, unsp w hypoxia or hypercapnia ([place])   . Afib ([place]) 01/25/2019  . SBO (small bowel obstruction) ([place]) 08/10/2018  . Anemia due to chronic blood loss   . Heme positive stool   . Platelet inhibition due to Plavix   . Acute GI bleeding 05/06/2018  . ESRD (end stage renal disease) ([place]) 05/06/2018  . Anxiety 05/06/2018  . Bipolar affective ([place]) 05/06/2018  . CAD (coronary artery disease), native coronary artery 05/06/2018  . COPD with chronic bronchitis ([place]) 05/06/2018  . CVA (cerebral vascular accident) ([place]) 05/06/2018  . Essential hypertension 05/06/2018  .  Hyperlipidemia 05/06/2018    Past Surgical History:  Procedure Laterality Date  . ABDOMINAL HYSTERECTOMY    . ABDOMINAL SURGERY     for small bowel obstruction - x 2  . APPENDECTOMY    . AV FISTULA PLACEMENT Left 08/04/2018   Procedure: ARTERIOVENOUS (AV) FISTULA CREATION LEFT ARM;  Surgeon: Waynetta Sandy, MD;  Location: [place];  Service: Vascular;  Laterality: Left;  . BASCILIC VEIN TRANSPOSITION Left 11/24/2018   Procedure: SECOND STAGE BASILIC VEIN TRANSPOSITION LEFT ARM;  Surgeon: Waynetta Sandy, MD;  Location: [place];  Service: Vascular;  Laterality: Left;  . CARDIAC CATHETERIZATION    . CORONARY ANGIOPLASTY  ?2003/2004  . FACIAL RECONSTRUCTION SURGERY     x 2  . HERNIA REPAIR       OB History   No obstetric history on file.     No family history on file.  Social History   Tobacco Use  . Smoking status: Former Smoker    Packs/day: 0.50    Years: 50.00    Pack years: 25.00    Quit date: 2019    Years since quitting: 2.1  . Smokeless tobacco: Never Used  Substance Use Topics  . Alcohol use: Not Currently    Comment: quit 2019 - "I was a drunk"  . Drug use: Not  Currently    Types: Marijuana    Comment: h/o drug use - "I was raised in the 60s"; last use 18 months ago; smokes marijuana periodically    Home Medications Prior to Admission medications   Medication Sig Start Date End Date Taking? Authorizing Provider  albuterol (PROVENTIL) (2.5 MG/3ML) 0.083% nebulizer solution Take 2.5 mg by nebulization every 6 (six) hours as needed for wheezing or shortness of breath.    [provider]  allopurinol (ZYLOPRIM) 100 MG tablet Take 100 mg by mouth daily. 02/18/18   [provider]  amLODipine (NORVASC) 10 MG tablet Take 10 mg by mouth daily.    [provider]  aspirin EC 81 MG tablet Take 81 mg by mouth daily.    [provider]  AURYXIA 1 GM 210 MG(Fe) tablet Take 420 mg by mouth See admin instructions. Take 3  tablets (630 mg) by mouth with each meal & take 2 tablets (420 mg) by mouth with each snack 08/16/18   [provider]  Carboxymethylcellul-Glycerin (LUBRICATING EYE DROPS OP) Place 1 drop into both eyes daily as needed (dry eyes).    [provider]  Cholecalciferol (VITAMIN D3) 50 MCG (2000 UT) capsule Take 2,000 Units by mouth daily.     [provider]  dicyclomine (BENTYL) 10 MG capsule Take 20 mg by mouth at bedtime.  08/27/17   [provider]  docusate sodium (COLACE) 100 MG capsule Take 1 capsule (100 mg total) by mouth 2 (two) times daily. 08/11/18   Hongalgi, Lenis Dickinson, MD  doxylamine, Sleep, (UNISOM) 25 MG tablet Take 25 mg by mouth at bedtime as needed for sleep.    [provider]  furosemide (LASIX) 40 MG tablet 40 mg lasix on non-HD days 03/01/19   Geradine Girt, DO  gabapentin (NEURONTIN) 300 MG capsule Take 1 capsule (300 mg total) by mouth at bedtime. 02/23/19   Mercy Riding, MD  Ipratropium-Albuterol (COMBIVENT) 20-100 MCG/ACT AERS respimat Inhale 2 puffs into the lungs 4 (four) times daily as needed for wheezing. 09/11/17   [provider]  isosorbide mononitrate (IMDUR) 30 MG 24 hr tablet Take 30 mg by mouth daily. 08/17/18   [provider]  lidocaine-prilocaine (EMLA) cream Apply 1 application topically daily as needed (1-2 hours before dialysis (cover with occlusive dressing)).  09/03/18   [provider]  metoprolol tartrate (LOPRESSOR) 100 MG tablet Take 1 tablet (100 mg total) by mouth 2 (two) times daily. 01/26/19   Little Ishikawa, MD  nitroGLYCERIN (NITROSTAT) 0.4 MG SL tablet Place 0.4 mg under the tongue every 5 (five) minutes as needed for chest pain.  09/18/16   [provider]  ondansetron (ZOFRAN) 4 MG tablet Take 4 mg by mouth 3 (three) times daily as needed for nausea. 09/14/18   [provider]  pantoprazole (PROTONIX) 40 MG tablet Take 1 tablet (40 mg total) by mouth  daily. Patient taking differently: Take 40 mg by mouth daily as needed (acid reflux/indigestion.).  08/11/18   Hongalgi, Lenis Dickinson, MD  polyethylene glycol (MIRALAX / GLYCOLAX) 17 g packet Take 17 g by mouth 2 (two) times daily. Patient taking differently: Take 17 g by mouth every other day.  08/11/18   Hongalgi, Lenis Dickinson, MD  pravastatin (PRAVACHOL) 80 MG tablet Take 80 mg by mouth every evening.  06/30/16   [provider]  sennosides-docusate sodium (SENOKOT-S) 8.6-50 MG tablet Take 1 tablet by mouth 2 (two) times daily.  [provider]  SYMBICORT 160-4.5 MCG/ACT inhaler Inhale 2 puffs into the lungs every 4 (four) hours as needed (for wheezing).  09/16/18   [provider]    Allergies    Cyclobenzaprine, Morphine, Penicillins, Ambien [zolpidem], Codeine, and Hydromorphone  Review of Systems   Review of Systems Ten systems reviewed and are negative for acute change, except as noted in the HPI.   Physical Exam Updated Vital Signs BP 129/82   Pulse 68   Temp (!) 97.5 F (36.4 C) (Oral)   Resp 15   Ht 5\' 2"  (1.575 m)   Wt 47.6 kg   SpO2 100%   BMI 19.20 kg/m   Physical Exam Vitals and nursing note reviewed.  Constitutional:      General: She is not in acute distress.    Appearance: She is well-developed. She is not diaphoretic. Ill appearance: appears chronically ill.  HENT:     Head: Normocephalic and atraumatic.  Eyes:     General: No scleral icterus.    Conjunctiva/sclera: Conjunctivae normal.  Cardiovascular:     Rate and Rhythm: Normal rate and regular rhythm.     Heart sounds: Normal heart sounds. No murmur. No friction rub. No gallop.   Pulmonary:     Effort: Pulmonary effort is normal. No respiratory distress.     Breath sounds: Normal breath sounds.  Abdominal:     General: Bowel sounds are decreased. There is no distension.     Palpations: Abdomen is soft. There is no mass.     Tenderness: There is abdominal tenderness. There is  guarding.     Comments: Well healed midline ex-lap scar, small reducible incisional hernia at the superior aspect of the scar.  Musculoskeletal:     Cervical back: Normal range of motion.  Skin:    General: Skin is warm and dry.  Neurological:     Mental Status: She is alert and oriented to person, place, and time.  Psychiatric:        Behavior: Behavior normal.     ED Results / Procedures / Treatments   Labs (all labs ordered are listed, but only abnormal results are displayed) Labs Reviewed  I-STAT CHEM 8, ED - Abnormal; Notable for the following components:      Result Value   Sodium 130 (*)    Chloride 95 (*)    BUN 65 (*)    Creatinine, Ser 7.50 (*)    Glucose, Bld 139 (*)    Calcium, Ion 0.97 (*)    Hemoglobin 9.2 (*)    HCT 27.0 (*)    All other components within normal limits  COMPREHENSIVE METABOLIC PANEL  CBC WITH DIFFERENTIAL/PLATELET  LIPASE, BLOOD    EKG None  Radiology No results found.  Procedures Procedures (including critical care time)  Medications Ordered in ED Medications  ondansetron (ZOFRAN) injection 4 mg (4 mg Intravenous Given 03/18/19 0221)  fentaNYL (SUBLIMAZE) injection 50 mcg (50 mcg Intravenous Given 03/18/19 1287)    ED Course  I have reviewed the triage vital signs and the nursing notes.  Pertinent labs & imaging results that were available during my care of the patient were reviewed by me and considered in my medical decision making (see chart for details).  Clinical Course as of Mar 17 632  Fri Mar 18, 2019  0315  I discussed OP CT at [facility] with Dr. Joelyn Oms. On 2/10 and 2/7, no acute findings   [AH]  0441 Lactic acid is WNL- No concern  for  mesenteric ischemia   Lactic acid, plasma [AH]  0539 Patient had 800 ml on in and out cath with urgency to urinate- Will place foley for urinary retention. Patient feels significantly better    [AH]  6144 CBC with Differential(!) [AH]    Clinical Course User Index [AH] Margarita Mail, PA-C   MDM Rules/Calculators/A&P                      Patient here with nausea vomiting, abdominal pain.  She has had multiple CT scans in the last month which were negative most recent on 09 March 2019.  Patient still has contrast material on her x-ray. Patient's work-up shows elevated creatinine and BUN in the setting of end-stage renal disease.  Her potassium is within normal limits, EKG shows no peaked T waves.  Lipase and lactic acid are within normal limits.  I do not feel that this represents mesenteric ischemia.  Patient's one-view portable abdomen shows no air-fluid levels or lettering suggestive of bowel obstruction.  Patient found to have urinary retention with 800 mL in her bladder.  Patient's urine is pending.  She has been able to tolerate p.o. fluids.  I have given signout to PA Alecia Lemming who will assume care of the patient for appropriate discharge.  Plan to discharge with leg bag.  Follow-up on urinalysis. Final Clinical Impression(s) / ED Diagnoses Final diagnoses:  None    Rx / DC Orders ED Discharge Orders    None       Margarita Mail, PA-C 03/18/19 3154    [place], April, MD 03/18/19 2357

## 2019-03-18 NOTE — ED Notes (Signed)
Pt is resting well, She stated the medication was effective and she stated, at this time, she no longer has any nausea or pain

## 2019-03-18 NOTE — ED Notes (Signed)
800 mL from in and out

## 2019-03-19 ENCOUNTER — Encounter (HOSPITAL_COMMUNITY): Payer: Self-pay | Admitting: Pharmacy Technician

## 2019-03-19 ENCOUNTER — Observation Stay (HOSPITAL_COMMUNITY): Payer: Medicare Other

## 2019-03-19 ENCOUNTER — Emergency Department (HOSPITAL_COMMUNITY): Payer: Medicare Other

## 2019-03-19 ENCOUNTER — Inpatient Hospital Stay (HOSPITAL_COMMUNITY)
Admission: EM | Admit: 2019-03-19 | Discharge: 2019-03-22 | DRG: 444 | Disposition: A | Discharge status: Home / Self Care | Payer: Medicare Other | Attending: Internal Medicine | Admitting: Internal Medicine

## 2019-03-19 ENCOUNTER — Other Ambulatory Visit: Payer: Self-pay

## 2019-03-19 DIAGNOSIS — R1084 Generalized abdominal pain: Secondary | ICD-10-CM

## 2019-03-19 DIAGNOSIS — Z9115 Patient's noncompliance with renal dialysis: Secondary | ICD-10-CM | POA: Diagnosis not present

## 2019-03-19 DIAGNOSIS — M109 Gout, unspecified: Secondary | ICD-10-CM | POA: Diagnosis present

## 2019-03-19 DIAGNOSIS — N186 End stage renal disease: Secondary | ICD-10-CM | POA: Diagnosis not present

## 2019-03-19 DIAGNOSIS — K810 Acute cholecystitis: Principal | ICD-10-CM | POA: Diagnosis present

## 2019-03-19 DIAGNOSIS — F319 Bipolar disorder, unspecified: Secondary | ICD-10-CM | POA: Diagnosis present

## 2019-03-19 DIAGNOSIS — Z681 Body mass index (BMI) 19 or less, adult: Secondary | ICD-10-CM

## 2019-03-19 DIAGNOSIS — Z66 Do not resuscitate: Secondary | ICD-10-CM | POA: Diagnosis present

## 2019-03-19 DIAGNOSIS — R64 Cachexia: Secondary | ICD-10-CM | POA: Diagnosis present

## 2019-03-19 DIAGNOSIS — R1011 Right upper quadrant pain: Secondary | ICD-10-CM

## 2019-03-19 DIAGNOSIS — I2510 Atherosclerotic heart disease of native coronary artery without angina pectoris: Secondary | ICD-10-CM | POA: Diagnosis present

## 2019-03-19 DIAGNOSIS — D631 Anemia in chronic kidney disease: Secondary | ICD-10-CM | POA: Diagnosis present

## 2019-03-19 DIAGNOSIS — I120 Hypertensive chronic kidney disease with stage 5 chronic kidney disease or end stage renal disease: Secondary | ICD-10-CM | POA: Diagnosis present

## 2019-03-19 DIAGNOSIS — Z79899 Other long term (current) drug therapy: Secondary | ICD-10-CM

## 2019-03-19 DIAGNOSIS — Z888 Allergy status to other drugs, medicaments and biological substances status: Secondary | ICD-10-CM

## 2019-03-19 DIAGNOSIS — I480 Paroxysmal atrial fibrillation: Secondary | ICD-10-CM | POA: Diagnosis present

## 2019-03-19 DIAGNOSIS — J449 Chronic obstructive pulmonary disease, unspecified: Secondary | ICD-10-CM | POA: Diagnosis present

## 2019-03-19 DIAGNOSIS — I4891 Unspecified atrial fibrillation: Secondary | ICD-10-CM | POA: Diagnosis present

## 2019-03-19 DIAGNOSIS — Z955 Presence of coronary angioplasty implant and graft: Secondary | ICD-10-CM

## 2019-03-19 DIAGNOSIS — K253 Acute gastric ulcer without hemorrhage or perforation: Secondary | ICD-10-CM

## 2019-03-19 DIAGNOSIS — Z7951 Long term (current) use of inhaled steroids: Secondary | ICD-10-CM

## 2019-03-19 DIAGNOSIS — E875 Hyperkalemia: Secondary | ICD-10-CM | POA: Diagnosis present

## 2019-03-19 DIAGNOSIS — Z992 Dependence on renal dialysis: Secondary | ICD-10-CM

## 2019-03-19 DIAGNOSIS — M1990 Unspecified osteoarthritis, unspecified site: Secondary | ICD-10-CM | POA: Diagnosis present

## 2019-03-19 DIAGNOSIS — Z880 Allergy status to penicillin: Secondary | ICD-10-CM

## 2019-03-19 DIAGNOSIS — Z885 Allergy status to narcotic agent status: Secondary | ICD-10-CM

## 2019-03-19 DIAGNOSIS — R109 Unspecified abdominal pain: Secondary | ICD-10-CM | POA: Diagnosis not present

## 2019-03-19 DIAGNOSIS — Z7982 Long term (current) use of aspirin: Secondary | ICD-10-CM

## 2019-03-19 DIAGNOSIS — R079 Chest pain, unspecified: Secondary | ICD-10-CM | POA: Diagnosis present

## 2019-03-19 DIAGNOSIS — Z20822 Contact with and (suspected) exposure to covid-19: Secondary | ICD-10-CM | POA: Diagnosis present

## 2019-03-19 DIAGNOSIS — E785 Hyperlipidemia, unspecified: Secondary | ICD-10-CM | POA: Diagnosis present

## 2019-03-19 DIAGNOSIS — N2581 Secondary hyperparathyroidism of renal origin: Secondary | ICD-10-CM | POA: Diagnosis present

## 2019-03-19 DIAGNOSIS — M898X9 Other specified disorders of bone, unspecified site: Secondary | ICD-10-CM | POA: Diagnosis present

## 2019-03-19 DIAGNOSIS — Z9114 Patient's other noncompliance with medication regimen: Secondary | ICD-10-CM

## 2019-03-19 DIAGNOSIS — R339 Retention of urine, unspecified: Secondary | ICD-10-CM | POA: Diagnosis present

## 2019-03-19 DIAGNOSIS — Z87891 Personal history of nicotine dependence: Secondary | ICD-10-CM

## 2019-03-19 DIAGNOSIS — K219 Gastro-esophageal reflux disease without esophagitis: Secondary | ICD-10-CM | POA: Diagnosis present

## 2019-03-19 LAB — CBC
HCT: 32.2 % — ABNORMAL LOW (ref 36.0–46.0)
Hemoglobin: 9.6 g/dL — ABNORMAL LOW (ref 12.0–15.0)
MCH: 30.4 pg (ref 26.0–34.0)
MCHC: 29.8 g/dL — ABNORMAL LOW (ref 30.0–36.0)
MCV: 101.9 fL — ABNORMAL HIGH (ref 80.0–100.0)
Platelets: 330 10*3/uL (ref 150–400)
RBC: 3.16 MIL/uL — ABNORMAL LOW (ref 3.87–5.11)
RDW: 17 % — ABNORMAL HIGH (ref 11.5–15.5)
WBC: 13 10*3/uL — ABNORMAL HIGH (ref 4.0–10.5)
nRBC: 0.2 % (ref 0.0–0.2)

## 2019-03-19 LAB — COMPREHENSIVE METABOLIC PANEL
ALT: 17 U/L (ref 0–44)
AST: 24 U/L (ref 15–41)
Albumin: 3.3 g/dL — ABNORMAL LOW (ref 3.5–5.0)
Alkaline Phosphatase: 85 U/L (ref 38–126)
Anion gap: 21 — ABNORMAL HIGH (ref 5–15)
BUN: 89 mg/dL — ABNORMAL HIGH (ref 8–23)
CO2: 19 mmol/L — ABNORMAL LOW (ref 22–32)
Calcium: 8.5 mg/dL — ABNORMAL LOW (ref 8.9–10.3)
Chloride: 94 mmol/L — ABNORMAL LOW (ref 98–111)
Creatinine, Ser: 7.17 mg/dL — ABNORMAL HIGH (ref 0.44–1.00)
GFR calc Af Amer: 6 mL/min — ABNORMAL LOW (ref >60)
GFR calc non Af Amer: 5 mL/min — ABNORMAL LOW (ref >60)
Glucose, Bld: 63 mg/dL — ABNORMAL LOW (ref 70–99)
Potassium: 5.3 mmol/L — ABNORMAL HIGH (ref 3.5–5.1)
Sodium: 134 mmol/L — ABNORMAL LOW (ref 135–145)
Total Bilirubin: 0.6 mg/dL (ref 0.3–1.2)
Total Protein: 6.5 g/dL (ref 6.5–8.1)

## 2019-03-19 LAB — RESPIRATORY PANEL BY RT PCR (FLU A&B, COVID)
Influenza A by PCR: NEGATIVE
Influenza B by PCR: NEGATIVE
SARS Coronavirus 2 by RT PCR: NEGATIVE

## 2019-03-19 LAB — TROPONIN I (HIGH SENSITIVITY)
Troponin I (High Sensitivity): 32 ng/L — ABNORMAL HIGH (ref <18)
Troponin I (High Sensitivity): 30 ng/L — ABNORMAL HIGH (ref <18)

## 2019-03-19 LAB — LIPASE, BLOOD: Lipase: 49 U/L (ref 11–51)

## 2019-03-19 MED ORDER — ACETAMINOPHEN 325 MG PO TABS
650.0000 mg | ORAL_TABLET | Freq: Four times a day (QID) | ORAL | Status: DC | PRN
  Administered 2019-03-19 – 2019-03-21 (×4): 650 mg via ORAL
  Filled 2019-03-19 (×4): qty 2

## 2019-03-19 MED ORDER — METRONIDAZOLE IN NACL 5-0.79 MG/ML-% IV SOLN
500.0000 mg | Freq: Three times a day (TID) | INTRAVENOUS | Status: DC
  Administered 2019-03-19 – 2019-03-22 (×7): 500 mg via INTRAVENOUS
  Filled 2019-03-19 (×7): qty 100

## 2019-03-19 MED ORDER — FENTANYL CITRATE (PF) 100 MCG/2ML IJ SOLN
50.0000 ug | Freq: Once | INTRAMUSCULAR | Status: AC
  Administered 2019-03-19: 50 ug via INTRAVENOUS
  Filled 2019-03-19: qty 2

## 2019-03-19 MED ORDER — SODIUM CHLORIDE 0.9 % IV SOLN
2.0000 g | INTRAVENOUS | Status: DC
  Administered 2019-03-19 – 2019-03-21 (×3): 2 g via INTRAVENOUS
  Filled 2019-03-19 (×2): qty 20
  Filled 2019-03-19 (×2): qty 2

## 2019-03-19 MED ORDER — IPRATROPIUM-ALBUTEROL 20-100 MCG/ACT IN AERS: 2.0000 | INHALATION_SPRAY | Freq: Four times a day (QID) | RESPIRATORY_TRACT | Status: DC | PRN

## 2019-03-19 MED ORDER — IPRATROPIUM-ALBUTEROL 0.5-2.5 (3) MG/3ML IN SOLN: 3.0000 mL | Freq: Four times a day (QID) | RESPIRATORY_TRACT | Status: DC | PRN

## 2019-03-19 MED ORDER — PANTOPRAZOLE SODIUM 40 MG PO TBEC
40.0000 mg | DELAYED_RELEASE_TABLET | Freq: Every day | ORAL | Status: DC
  Administered 2019-03-19 – 2019-03-21 (×3): 40 mg via ORAL
  Filled 2019-03-19 (×3): qty 1

## 2019-03-19 MED ORDER — ASPIRIN EC 81 MG PO TBEC
81.0000 mg | DELAYED_RELEASE_TABLET | Freq: Every day | ORAL | Status: DC
  Administered 2019-03-20 – 2019-03-22 (×3): 81 mg via ORAL
  Filled 2019-03-19 (×3): qty 1

## 2019-03-19 MED ORDER — ACETAMINOPHEN 650 MG RE SUPP: 650.0000 mg | Freq: Four times a day (QID) | RECTAL | Status: DC | PRN

## 2019-03-19 MED ORDER — MOMETASONE FURO-FORMOTEROL FUM 200-5 MCG/ACT IN AERO
2.0000 | INHALATION_SPRAY | Freq: Two times a day (BID) | RESPIRATORY_TRACT | Status: DC
  Administered 2019-03-20 – 2019-03-22 (×3): 2 via RESPIRATORY_TRACT
  Filled 2019-03-19 (×2): qty 8.8

## 2019-03-19 MED ORDER — FENTANYL CITRATE (PF) 100 MCG/2ML IJ SOLN
25.0000 ug | Freq: Once | INTRAMUSCULAR | Status: AC
  Administered 2019-03-19: 25 ug via INTRAVENOUS
  Filled 2019-03-19: qty 2

## 2019-03-19 MED ORDER — SODIUM ZIRCONIUM CYCLOSILICATE 10 G PO PACK
10.0000 g | PACK | Freq: Every day | ORAL | Status: DC
  Administered 2019-03-20 – 2019-03-21 (×2): 10 g via ORAL
  Filled 2019-03-19 (×2): qty 1

## 2019-03-19 MED ORDER — IOHEXOL 350 MG/ML SOLN
80.0000 mL | Freq: Once | INTRAVENOUS | Status: AC | PRN
  Administered 2019-03-19: 80 mL via INTRAVENOUS

## 2019-03-19 MED ORDER — POLYETHYLENE GLYCOL 3350 17 G PO PACK: 17.0000 g | PACK | Freq: Every day | ORAL | Status: DC | PRN

## 2019-03-19 MED ORDER — NITROGLYCERIN 0.4 MG SL SUBL: 0.4000 mg | SUBLINGUAL_TABLET | SUBLINGUAL | Status: DC | PRN

## 2019-03-19 MED ORDER — DICYCLOMINE HCL 10 MG PO CAPS
20.0000 mg | ORAL_CAPSULE | Freq: Every day | ORAL | Status: DC
  Administered 2019-03-19 – 2019-03-21 (×3): 20 mg via ORAL
  Filled 2019-03-19 (×3): qty 2

## 2019-03-19 MED ORDER — METOPROLOL TARTRATE 100 MG PO TABS
100.0000 mg | ORAL_TABLET | Freq: Two times a day (BID) | ORAL | Status: DC
  Administered 2019-03-19 – 2019-03-21 (×4): 100 mg via ORAL
  Filled 2019-03-19 (×4): qty 1

## 2019-03-19 MED ORDER — ISOSORBIDE MONONITRATE ER 30 MG PO TB24
30.0000 mg | ORAL_TABLET | Freq: Every day | ORAL | Status: DC
  Administered 2019-03-19 – 2019-03-21 (×3): 30 mg via ORAL
  Filled 2019-03-19 (×3): qty 1

## 2019-03-19 MED ORDER — PRAVASTATIN SODIUM 40 MG PO TABS
80.0000 mg | ORAL_TABLET | Freq: Every evening | ORAL | Status: DC
  Administered 2019-03-19 – 2019-03-21 (×3): 80 mg via ORAL
  Filled 2019-03-19 (×3): qty 2

## 2019-03-19 MED ORDER — CALCITRIOL 0.25 MCG PO CAPS: 1.0000 ug | ORAL_CAPSULE | ORAL | Status: DC

## 2019-03-19 NOTE — Consult Note (Addendum)
[facility] Renal Consultation Note  Indication for Consultation:  Management of ESRD/hemodialysis; anemia, hypertension/volume and secondary hyperparathyroidism  HPI: Christina [name] is a 69 y.o. female with ESRD on HD TTS at [facility]( last HD Tues 03/15/19 only stayed 1hr 79min of 4 hr tx ) medical history of noncompliance with op HD ,HTN, COPD, CAD s/p stents, A fib, GERD, Hx SBO , bipolar disorder and substance abuse who is being  admitted with recurrent worsening  abdominal discomfort (ER CT Gallbladder thickening possible early cholecystitis with mild white count CCS consulted /also 42mm exophytic mass off Left [place]  Not on 02/21/19 CT ? Hemorrhagic or compolicated cyst needs MRI /also noted Moderate fecal loading in the colon. CXR min vascular congestion , Had O2 sat 100% on rm air , K 5.3 bun 89 cr 7.17  co2 19, wbc 13, hgb 9.6  Troponin 32  She presented [facility] op kid center with chest pain and reported resp distress  And sent to [facility] ([facility] ER . Noted had hd 03/15/19 ,but missed 2/13and 2/11.   Seen in ER  With reported chest pain resolved after iV pain meds/ not in resp distress  With Rm air o2 sat 100%. We are consulted for HD  With plans for HD today . She tells me she and family are trying to make better living situation so she will not miss HD`      Past Medical History:  Diagnosis Date  . Anemia   . Anxiety   . Arthritis   . Asthma   . Atrial fibrillation ([place])   . Chronic kidney disease    Dialysis T/Th/Sa  started in March 2020  . COPD (chronic obstructive pulmonary disease) ([place])   . Coronary artery disease    2 stents  . Depression   . GERD (gastroesophageal reflux disease)   . Gout   . Headache    migraines  . History of kidney stones   . Hyperlipidemia   . Hypertension   . Pneumonia   . Small bowel obstruction [facility])     Past Surgical History:  Procedure Laterality Date  . ABDOMINAL HYSTERECTOMY    . ABDOMINAL SURGERY     for small bowel  obstruction - x 2  . APPENDECTOMY    . AV FISTULA PLACEMENT Left 08/04/2018   Procedure: ARTERIOVENOUS (AV) FISTULA CREATION LEFT ARM;  Surgeon: Waynetta Sandy, MD;  Location: [place];  Service: Vascular;  Laterality: Left;  . BASCILIC VEIN TRANSPOSITION Left 11/24/2018   Procedure: SECOND STAGE BASILIC VEIN TRANSPOSITION LEFT ARM;  Surgeon: Waynetta Sandy, MD;  Location: [place];  Service: Vascular;  Laterality: Left;  . CARDIAC CATHETERIZATION    . CORONARY ANGIOPLASTY  ?2003/2004  . FACIAL RECONSTRUCTION SURGERY     x 2  . HERNIA REPAIR       No family history on file.    reports that she quit smoking about 2 years ago. She has a 25.00 pack-year smoking history. She has never used smokeless tobacco. She reports previous alcohol use. She reports previous drug use. Drug: Marijuana.   Allergies  Allergen Reactions  . Cyclobenzaprine Anaphylaxis and Other (See Comments)    "stopped heart"   . Morphine Anaphylaxis    "stopped heart"  . Penicillins Shortness Of Breath, Swelling and Palpitations    Did it involve swelling of the face/tongue/throat, SOB, or low BP? Yes Did it involve sudden or severe rash/hives, skin peeling, or any reaction on the inside of  your mouth or nose? No Did you need to seek medical attention at a hospital or doctor's office? No When did it last happen?years  If all above answers are "NO", may proceed with cephalosporin use.   . Ambien [Zolpidem] Other (See Comments)    Severe confusion  . Codeine Itching and Rash  . Hydromorphone Other (See Comments)    If administered quickly, felt like hand was "exploding"     Prior to Admission medications   Medication Sig Start Date End Date Taking? Authorizing Provider  albuterol (PROVENTIL) (2.5 MG/3ML) 0.083% nebulizer solution Take 2.5 mg by nebulization every 6 (six) hours as needed for wheezing or shortness of breath.    [provider]  allopurinol (ZYLOPRIM) 100 MG tablet Take  100 mg by mouth daily. 02/18/18   [provider]  amLODipine (NORVASC) 10 MG tablet Take 10 mg by mouth daily.    [provider]  aspirin EC 81 MG tablet Take 81 mg by mouth daily.    [provider]  AURYXIA 1 GM 210 MG(Fe) tablet Take 420 mg by mouth See admin instructions. Take 3 tablets (630 mg) by mouth with each meal & take 2 tablets (420 mg) by mouth with each snack 08/16/18   [provider]  Carboxymethylcellul-Glycerin (LUBRICATING EYE DROPS OP) Place 1 drop into both eyes daily as needed (dry eyes).    [provider]  Cholecalciferol (VITAMIN D3) 50 MCG (2000 UT) capsule Take 2,000 Units by mouth daily.     [provider]  dicyclomine (BENTYL) 10 MG capsule Take 20 mg by mouth at bedtime.  08/27/17   [provider]  docusate sodium (COLACE) 100 MG capsule Take 1 capsule (100 mg total) by mouth 2 (two) times daily. 08/11/18   Hongalgi, Lenis Dickinson, MD  doxylamine, Sleep, (UNISOM) 25 MG tablet Take 25 mg by mouth at bedtime as needed for sleep.    [provider]  furosemide (LASIX) 40 MG tablet 40 mg lasix on non-HD days Patient taking differently: Take 20 mg by mouth See admin instructions. daily on non-HD days 03/01/19   Geradine Girt, DO  gabapentin (NEURONTIN) 300 MG capsule Take 1 capsule (300 mg total) by mouth at bedtime. 02/23/19   Mercy Riding, MD  Ipratropium-Albuterol (COMBIVENT) 20-100 MCG/ACT AERS respimat Inhale 2 puffs into the lungs 4 (four) times daily as needed for wheezing. 09/11/17   [provider]  isosorbide mononitrate (IMDUR) 30 MG 24 hr tablet Take 30 mg by mouth daily. 08/17/18   [provider]  lidocaine-prilocaine (EMLA) cream Apply 1 application topically daily as needed (1-2 hours before dialysis (cover with occlusive dressing)).  09/03/18   [provider]  metoprolol tartrate (LOPRESSOR) 100 MG tablet Take 1 tablet (100 mg total) by mouth 2 (two) times daily. 01/26/19    Little Ishikawa, MD  nitroGLYCERIN (NITROSTAT) 0.4 MG SL tablet Place 0.4 mg under the tongue every 5 (five) minutes as needed for chest pain.  09/18/16   [provider]  ondansetron (ZOFRAN) 4 MG tablet Take 4 mg by mouth 3 (three) times daily as needed for nausea. 09/14/18   [provider]  pantoprazole (PROTONIX) 40 MG tablet Take 1 tablet (40 mg total) by mouth daily. Patient taking differently: Take 40 mg by mouth daily as needed (acid reflux/indigestion.).  08/11/18   Hongalgi, Lenis Dickinson, MD  polyethylene glycol (MIRALAX / GLYCOLAX) 17 g packet Take 17 g by mouth 2 (two) times daily.  Patient taking differently: Take 17 g by mouth every other day.  08/11/18   Hongalgi, Lenis Dickinson, MD  pravastatin (PRAVACHOL) 80 MG tablet Take 80 mg by mouth every evening.  06/30/16   [provider]  sennosides-docusate sodium (SENOKOT-S) 8.6-50 MG tablet Take 1 tablet by mouth 2 (two) times daily.     [provider]  SYMBICORT 160-4.5 MCG/ACT inhaler Inhale 2 puffs into the lungs every 4 (four) hours as needed (for wheezing).  09/16/18   [provider]     Continuous: . cefTRIAXone (ROCEPHIN)  IV    . metronidazole      Results for orders placed or performed during the hospital encounter of 03/19/19 (from the past 48 hour(s))  CBC     Status: Abnormal   Collection Time: 03/19/19  2:00 PM  Result Value Ref Range   WBC 13.0 (H) 4.0 - 10.5 K/uL   RBC 3.16 (L) 3.87 - 5.11 MIL/uL   Hemoglobin 9.6 (L) 12.0 - 15.0 g/dL   HCT 32.2 (L) 36.0 - 46.0 %   MCV 101.9 (H) 80.0 - 100.0 fL   MCH 30.4 26.0 - 34.0 pg   MCHC 29.8 (L) 30.0 - 36.0 g/dL   RDW 17.0 (H) 11.5 - 15.5 %   Platelets 330 150 - 400 K/uL   nRBC 0.2 0.0 - 0.2 %    Comment: Performed at [facility], [address], [place], [place] [address]  Comprehensive metabolic panel     Status: Abnormal   Collection Time: 03/19/19  2:00 PM  Result Value Ref Range   Sodium 134 (L) 135 - 145 mmol/L    Potassium 5.3 (H) 3.5 - 5.1 mmol/L   Chloride 94 (L) 98 - 111 mmol/L   CO2 19 (L) 22 - 32 mmol/L   Glucose, Bld 63 (L) 70 - 99 mg/dL   BUN 89 (H) 8 - 23 mg/dL   Creatinine, Ser 7.17 (H) 0.44 - 1.00 mg/dL   Calcium 8.5 (L) 8.9 - 10.3 mg/dL   Total Protein 6.5 6.5 - 8.1 g/dL   Albumin 3.3 (L) 3.5 - 5.0 g/dL   AST 24 15 - 41 U/L   ALT 17 0 - 44 U/L   Alkaline Phosphatase 85 38 - 126 U/L   Total Bilirubin 0.6 0.3 - 1.2 mg/dL   GFR calc non Af Amer 5 (L) >60 mL/min   GFR calc Af Amer 6 (L) >60 mL/min   Anion gap 21 (H) 5 - 15    Comment: Performed at [facility], [place] [address] [place], [place] [address]     ROS: see hpi    Physical Exam: Vitals:   03/19/19 1628 03/19/19 1645  BP:  (!) 114/95  Pulse:    Resp:  18  Temp: 98.4 F (36.9 C)   SpO2:       General: Alert NAD  thin chronically ill appearing female  Head: NCAT sclera not icteric  Neck: Supple. No lymphadenopathy Lungs: mostly CTA bilaterally. Breathing is unlabored. Heart: RRR. No murmur, rubs or gallops.  Abdomen: soft, tender Right upper and lower quads , +BS,  Lower extremities:no edema, ischemic changes, or open wounds  Neuro: AAOx3. Moves all extremities spontaneously. Dialysis Access: LU AVF+b with mild  non tender ecchymosis of upper arm, R IJ TDC    Dialysis Orders: TTS - [facility]  4hrs,EDWQ 45.6 kg, 2K/ 2Ca Access: LU AVF,  IJ  TDC  Heparin none Mircera 30 mcg q2wks -  last 03/15/19 Venofer 50mg  qHD Calcitriol 0.75 mcg PO qHD    Assessment/Plan 1. Hyperkalemia - missed HD  Hd today  2. ESRD -  HD TTS schedule, use perm cath today ,but attempt AVF later in admit (ho infiltrate), has Foley cath need to dc if able  3.  Abdominal pain  - ?? Early cholecystitis - plan  per admit /CCS consulted , antibiotics started 4. Chest pain ho CAD sp stents = first trop Neg , ? gallbladder dz  5. Hypertension/volume  - no excess volume on exam , mild vas congest on cxr  uf 2l as tolerated on HD  6. Anemia  -  hgb 9,3 just received  ESA 2/16 wkly fe  On hd , 7. Metabolic bone disease -  Binders when eating meals , po vit d on hd start next week  8. Nutrition - alb 3.1  Protein supplement when eating  9. HO AFIB - SR on exam  On BB , Not AC canidate 22/2 gi bld  10. Nonadherence to dialysis- continue to address barriers to attendance  11. HO Substance abuse/ ho bipolar   Ernest Haber, PA-C [place] [phone] 03/19/2019, 5:31 PM    I have discussed this patient and agree with plan and assessment in the above note with renal recommendations/intervention highlighted.  Will plan for HD today but unfortunately we are waiting on the covid test to result before we can bring her up for dialysis.  Mild hyperkalemia and she has several imaging studies ordered per surgery to evaluate for possible cholecystitis.    CT angiogram shows severe stenosis of celiac axis, left main and 3 vessel CAD, and gallbladder thickening so HIDA scan ordered to r/o acute cholecystitis.  CXR without significant vascular congestion.   Governor Rooks Edit Ricciardelli,MD 03/19/2019 8:27 PM

## 2019-03-19 NOTE — ED Provider Notes (Signed)
Patient CARE signed out to follow-up results and admit to medicine.  Patient presents with worsening abdominal pain and chest pain.  EKG no obvious ischemic findings.  Troponin pending and delayed result nursing staff looking into it.  Consulted nephrology who will dialyze the patient later today, rapid Covid ordered.  CT scan multiple chronic abnormalities and will need close outpatient follow-up however gallbladder wall thickening is seen difficult to tell if from early cholecystitis with mild white count and abdominal pain versus mild fluid overload state.  Paged surgery for consult.  Discussed with medicine for observation, dialysis, serial abdominal exams, IV antibiotics and reassessment.     Elnora Morrison, MD 03/19/19 1726

## 2019-03-19 NOTE — ED Triage Notes (Signed)
Pt arrives via ems with central cp onset approx 1 week. Reports pain is a pressure 8/10, non radiating. Recently discharged with GI bleed and UTI. HD on T, [place], Sat with last tx on Tuesday. 176/84 HR 64 96% RA 98.29F

## 2019-03-19 NOTE — ED Provider Notes (Signed)
[facility] EMERGENCY DEPARTMENT Provider Note   CSN: [id number] Arrival date & time: 03/19/19  1230     History Chief Complaint  Patient presents with  . Chest Pain    Christina [name] is a 69 y.o. female.  HPI 69 year old female presents today complaining of chest pain.  Patient was seen here yesterday with abdominal pain and had labs.  States she is continued to have abdominal pain that has been present since yesterday.  However, she has also had onset of substernal chest pain which began last night.  It has been present since 10 PM last night.  She describes it as pressure and 8 out of 10.  She denies any any similar symptoms of chest pain in the past.  Associated with the abdominal pain or nausea, vomiting, diarrhea.  States that she attempted to eat toast today and vomited.  She has a history of small bowel obstruction in the past.  She has had some dyspnea which is at baseline for her.  She has previously been on oxygen but is not currently on oxygen therapy.  She endorses having had some chills.  She denies any cough, fever, or history of Covid.  She went to dialysis today but did not receive any dialysis.  She describes the current pain as 8 out of 10.  She normally lives alone walks with a cane or a walker.  Her daughter comes in some and helps her.  Her primary care physician is in [place].  She is unable to tell me who her nephrologist is.  She is dialyzed through a tunneled catheter in the right chest wall.  She has had a fistula placed in her left upper arm which has not been used at this time.  She denies any symptoms of infection in the catheter or the arm.    Past Medical History:  Diagnosis Date  . Anemia   . Anxiety   . Arthritis   . Asthma   . Atrial fibrillation ([place])   . Chronic kidney disease    Dialysis T/Th/Sa  started in March 2020  . COPD (chronic obstructive pulmonary disease) ([place])   . Coronary artery disease    2 stents  . Depression    . GERD (gastroesophageal reflux disease)   . Gout   . Headache    migraines  . History of kidney stones   . Hyperlipidemia   . Hypertension   . Pneumonia   . Small bowel obstruction [facility])     Patient Active Problem List   Diagnosis Date Noted  . Hyperkalemia 02/28/2019  . Uremia 02/27/2019  . Volume overload 02/21/2019  . Abdominal pain 02/21/2019  . Non-compliance with renal dialysis ([place]) 02/21/2019  . Respiratory failure ([place]) 02/04/2019  . Acute and chr resp failure, unsp w hypoxia or hypercapnia ([place])   . Afib ([place]) 01/25/2019  . SBO (small bowel obstruction) ([place]) 08/10/2018  . Anemia due to chronic blood loss   . Heme positive stool   . Platelet inhibition due to Plavix   . Acute GI bleeding 05/06/2018  . ESRD (end stage renal disease) ([place]) 05/06/2018  . Anxiety 05/06/2018  . Bipolar affective ([place]) 05/06/2018  . CAD (coronary artery disease), native coronary artery 05/06/2018  . COPD with chronic bronchitis ([place]) 05/06/2018  . CVA (cerebral vascular accident) ([place]) 05/06/2018  . Essential hypertension 05/06/2018  . Hyperlipidemia 05/06/2018    Past Surgical History:  Procedure Laterality Date  . ABDOMINAL HYSTERECTOMY    .  ABDOMINAL SURGERY     for small bowel obstruction - x 2  . APPENDECTOMY    . AV FISTULA PLACEMENT Left 08/04/2018   Procedure: ARTERIOVENOUS (AV) FISTULA CREATION LEFT ARM;  Surgeon: Waynetta Sandy, MD;  Location: [facility];  Service: Vascular;  Laterality: Left;  . BASCILIC VEIN TRANSPOSITION Left 11/24/2018   Procedure: SECOND STAGE BASILIC VEIN TRANSPOSITION LEFT ARM;  Surgeon: Waynetta Sandy, MD;  Location: [place] [facility];  Service: Vascular;  Laterality: Left;  . CARDIAC CATHETERIZATION    . CORONARY ANGIOPLASTY  ?2003/2004  . FACIAL RECONSTRUCTION SURGERY     x 2  . HERNIA REPAIR       OB History   No obstetric history on file.     No family history on file.  Social History   Tobacco Use  . Smoking status:  Former Smoker    Packs/day: 0.50    Years: 50.00    Pack years: 25.00    Quit date: 2019    Years since quitting: 2.1  . Smokeless tobacco: Never Used  Substance Use Topics  . Alcohol use: Not Currently    Comment: quit 2019 - "I was a drunk"  . Drug use: Not Currently    Types: Marijuana    Comment: h/o drug use - "I was raised in the 60s"; last use 18 months ago; smokes marijuana periodically    Home Medications Prior to Admission medications   Medication Sig Start Date End Date Taking? Authorizing Provider  albuterol (PROVENTIL) (2.5 MG/3ML) 0.083% nebulizer solution Take 2.5 mg by nebulization every 6 (six) hours as needed for wheezing or shortness of breath.    [provider]  allopurinol (ZYLOPRIM) 100 MG tablet Take 100 mg by mouth daily. 02/18/18   [provider]  amLODipine (NORVASC) 10 MG tablet Take 10 mg by mouth daily.    [provider]  aspirin EC 81 MG tablet Take 81 mg by mouth daily.    [provider]  AURYXIA 1 GM 210 MG(Fe) tablet Take 420 mg by mouth See admin instructions. Take 3 tablets (630 mg) by mouth with each meal & take 2 tablets (420 mg) by mouth with each snack 08/16/18   [provider]  Carboxymethylcellul-Glycerin (LUBRICATING EYE DROPS OP) Place 1 drop into both eyes daily as needed (dry eyes).    [provider]  Cholecalciferol (VITAMIN D3) 50 MCG (2000 UT) capsule Take 2,000 Units by mouth daily.     [provider]  dicyclomine (BENTYL) 10 MG capsule Take 20 mg by mouth at bedtime.  08/27/17   [provider]  docusate sodium (COLACE) 100 MG capsule Take 1 capsule (100 mg total) by mouth 2 (two) times daily. 08/11/18   Hongalgi, Lenis Dickinson, MD  doxylamine, Sleep, (UNISOM) 25 MG tablet Take 25 mg by mouth at bedtime as needed for sleep.    [provider]  furosemide (LASIX) 40 MG tablet 40 mg lasix on non-HD days Patient taking differently: Take 20 mg by mouth See admin  instructions. daily on non-HD days 03/01/19   Geradine Girt, DO  gabapentin (NEURONTIN) 300 MG capsule Take 1 capsule (300 mg total) by mouth at bedtime. 02/23/19   Mercy Riding, MD  Ipratropium-Albuterol (COMBIVENT) 20-100 MCG/ACT AERS respimat Inhale 2 puffs into the lungs 4 (four) times daily as needed for wheezing. 09/11/17   [provider]  isosorbide mononitrate (IMDUR) 30 MG 24 hr tablet Take 30 mg by mouth daily. 08/17/18  [provider]  lidocaine-prilocaine (EMLA) cream Apply 1 application topically daily as needed (1-2 hours before dialysis (cover with occlusive dressing)).  09/03/18   [provider]  metoprolol tartrate (LOPRESSOR) 100 MG tablet Take 1 tablet (100 mg total) by mouth 2 (two) times daily. 01/26/19   Little Ishikawa, MD  nitroGLYCERIN (NITROSTAT) 0.4 MG SL tablet Place 0.4 mg under the tongue every 5 (five) minutes as needed for chest pain.  09/18/16   [provider]  ondansetron (ZOFRAN) 4 MG tablet Take 4 mg by mouth 3 (three) times daily as needed for nausea. 09/14/18   [provider]  pantoprazole (PROTONIX) 40 MG tablet Take 1 tablet (40 mg total) by mouth daily. Patient taking differently: Take 40 mg by mouth daily as needed (acid reflux/indigestion.).  08/11/18   Hongalgi, Lenis Dickinson, MD  polyethylene glycol (MIRALAX / GLYCOLAX) 17 g packet Take 17 g by mouth 2 (two) times daily. Patient taking differently: Take 17 g by mouth every other day.  08/11/18   Hongalgi, Lenis Dickinson, MD  pravastatin (PRAVACHOL) 80 MG tablet Take 80 mg by mouth every evening.  06/30/16   [provider]  sennosides-docusate sodium (SENOKOT-S) 8.6-50 MG tablet Take 1 tablet by mouth 2 (two) times daily.     [provider]  SYMBICORT 160-4.5 MCG/ACT inhaler Inhale 2 puffs into the lungs every 4 (four) hours as needed (for wheezing).  09/16/18   [provider]    Allergies    Cyclobenzaprine, Morphine, Penicillins, Ambien  [zolpidem], Codeine, and Hydromorphone  Review of Systems   Review of Systems  All other systems reviewed and are negative.   Physical Exam Updated Vital Signs BP (!) 173/72   Pulse 64   Resp 14   SpO2 100%   Physical Exam Vitals and nursing note reviewed.  Constitutional:      Appearance: She is well-developed.  HENT:     Head: Normocephalic.  Eyes:     Pupils: Pupils are equal, round, and reactive to light.  Cardiovascular:     Rate and Rhythm: Normal rate and regular rhythm.     Heart sounds: Normal heart sounds.  Pulmonary:     Effort: Pulmonary effort is normal.     Breath sounds: Normal breath sounds.  Abdominal:     General: Abdomen is flat. Bowel sounds are increased.     Palpations: Abdomen is soft.     Tenderness: There is generalized abdominal tenderness.  Musculoskeletal:     Cervical back: Normal range of motion and neck supple.  Neurological:     Mental Status: She is alert.     ED Results / Procedures / Treatments   Labs (all labs ordered are listed, but only abnormal results are displayed) Labs Reviewed - No data to display  EKG EKG Interpretation  Date/Time:  Saturday March 19 2019 12:41:58 EST Ventricular Rate:  63 PR Interval:    QRS Duration: 95 QT Interval:  455 QTC Calculation: 466 R Axis:   67 Text Interpretation: Normal sinus rhythm Non-specific ST-t changes No significant change since last tracing Confirmed by Pattricia Boss [phone]) on 03/19/2019 12:53:00 PM   Radiology DG Abd Portable 1 View  Result Date: 03/18/2019 CLINICAL DATA:  Question small bowel obstruction. Clinical notes states abdominal pain. Vomiting. EXAM: PORTABLE ABDOMEN - 1 VIEW COMPARISON:  CT 03/09/2019 at [facility] , 9 days prior. Patient is also had abdominal CT 03/06/2019, 02/27/2019, and 02/21/2019. FINDINGS: There is contrast within the descending  and sigmoid colon from prior radiologic exam. Small volume of stool in the ascending colon. Air-filled  prominent but nondilated small bowel in the left abdomen. No abnormal gastric distension. Vascular calcifications. Renal calcifications on CT not well visualized radiographically. No evidence of free air. No acute osseous abnormalities are seen. IMPRESSION: No evidence of small-bowel obstruction or acute findings. There is enteric contrast within the descending and sigmoid colon from prior radiologic exam. Patient has had 4 abdominal CTs in the last 4 weeks, most recent CT was 03/09/2019 at [facility] Electronically Signed   By: Keith Rake M.D.   On: 03/18/2019 03:07    Procedures .Critical Care Performed by: Pattricia Boss, MD Authorized by: Pattricia Boss, MD   Critical care provider statement:    Critical care time (minutes):  45   Critical care end time:  03/19/2019 4:21 PM   Critical care was time spent personally by me on the following activities:  Discussions with consultants, evaluation of patient's response to treatment, examination of patient, ordering and performing treatments and interventions, ordering and review of laboratory studies, ordering and review of radiographic studies, pulse oximetry, re-evaluation of patient's condition, obtaining history from patient or surrogate and review of old charts   (including critical care time)  Medications Ordered in ED Medications - No data to display  ED Course  I have reviewed the triage vital signs and the nursing notes.  Pertinent labs & imaging results that were available during my care of the patient were reviewed by me and considered in my medical decision making (see chart for details).  1- chest pain- ct without acute dissection, ekg without acute ischemic changes, first trop negative.  Will need repeat trop 2- abdominal pain- diffuse ttp, some gb wall thickening and diffuse vascular disease but no definite cause of acute pain, no sbo.  Patient has had multiple repeat cts noted by radiology. 3- esrd- patient missed  dialysis today, some hyperkalemia noted, but no acute volume overload noted.  Signed out to Dr. Reather Converse who will    MDM Rules/Calculators/A&P                      Patient presented with  Final Clinical Impression(s) / ED Diagnoses Final diagnoses:  Chest pain, unspecified type  Generalized abdominal pain  ESRD (end stage renal disease) ([place])  Hyperkalemia    Rx / DC Orders ED Discharge Orders    None       Pattricia Boss, MD 03/19/19 1621

## 2019-03-19 NOTE — H&P (Signed)
History and Physical:    Christina [name]   KNL:[id number] DOB: 06/19/1950 DOA: 03/19/2019  Referring MD/provider: Dr. Reather Converse PCP: Maggie Schwalbe, PA-C   Patient coming from: Home  Chief Complaint: Abdominal pain  History of Present Illness:   Christina [name] is an 69 y.o. female with PMH significant for ESRD on HD with history of multiple missed dialyses, HTN, A. fib, CAD, COPD, history of SBO and depression who presents with abdominal and chest pain.  Patient states that she has had intermittent chest pain over the past 3 to 4 days which she describes as a "mashing kind of pain" over her central chest and upper abdominal area.  These episodes occur at rest.  They come and go with no known provocation.  No associated shortness of breath.  Patient does admit to nausea and vomiting which she has had for the last couple of days.  Patient also complaining of epigastric abdominal pain which she states comes in waves.  There is associated nausea.  She has vomited twice since yesterday.  While I was talking to her patient developed what she describes as chest pain which seem to be spasmodic versus colicky.  When I asked her to describe where it was she pointed to her upper abdominal area.  Patient admits that she missed her last dialysis.  She did come into dialysis today but because of her chest and abdominal pain she was sent to the ED.  Of note patient has had multiple similar admissions with abdominal pain after having missed dialysis.  And in the past abdominal pain resolves with dialysis.  Patient denies fevers or chills.  She does admit to vomitus x2.  Not bloody.  No malaise.  She has been passing gas.  Denies constipation.  Patient admits she is not very active so is not sure whether the chest pain that she is having is associated with exertion or not.  ED Course:  The patient was noted to be hypertensive with no oxygen requirement.  She underwent a CTA chest, abdomen and  pelvis which showed multiple abnormalities mostly chronic.  Was noted on CT report that patient has had 4 abdominal CT scans in the past 4 weeks, both here and at [facility]. Notable on the CT though was some gallbladder thickening with edema but only mild distention, no calcified gallstones, equivocal for acute cholecystitis.   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   She is also noted to have three-vessel coronary artery disease and left main disease.   Nephrology consultation was called and they are planning on dialyzing her today.  General surgery consultation was called and they have yet to see her.  She was started on Zosyn for possible acute cholecystitis.  ROS:   ROS   Review of Systems: General: Denies fever, chills, malaise,  Respiratory: Denies cough, no change in baseline shortness of breath.  No hemoptysis.  Cardiovascular: Denies palpitations GU: Denies dysuria, frequency or hematuria   Past Medical History:   Past Medical History:  Diagnosis Date  . Anemia   . Anxiety   . Arthritis   . Asthma   . Atrial fibrillation ([place])   . Chronic kidney disease    Dialysis T/Th/Sa  started in March 2020  . COPD (chronic obstructive pulmonary disease) ([place])   . Coronary artery disease    2 stents  . Depression   . GERD (gastroesophageal reflux disease)   . Gout   .  Headache    migraines  . History of kidney stones   . Hyperlipidemia   . Hypertension   . Pneumonia   . Small bowel obstruction [facility])     Past Surgical History:   Past Surgical History:  Procedure Laterality Date  . ABDOMINAL HYSTERECTOMY    . ABDOMINAL SURGERY     for small bowel obstruction - x 2  . APPENDECTOMY    . AV FISTULA PLACEMENT Left 08/04/2018   Procedure: ARTERIOVENOUS (AV) FISTULA CREATION LEFT ARM;  Surgeon: Waynetta Sandy, MD;  Location: [place];  Service: Vascular;  Laterality: Left;  . BASCILIC VEIN TRANSPOSITION Left  11/24/2018   Procedure: SECOND STAGE BASILIC VEIN TRANSPOSITION LEFT ARM;  Surgeon: Waynetta Sandy, MD;  Location: [place];  Service: Vascular;  Laterality: Left;  . CARDIAC CATHETERIZATION    . CORONARY ANGIOPLASTY  ?2003/2004  . FACIAL RECONSTRUCTION SURGERY     x 2  . HERNIA REPAIR      Social History:   Social History   Socioeconomic History  . Marital status: Single    Spouse name: Not on file  . Number of children: Not on file  . Years of education: Not on file  . Highest education level: Not on file  Occupational History  . Occupation: retired  Tobacco Use  . Smoking status: Former Smoker    Packs/day: 0.50    Years: 50.00    Pack years: 25.00    Quit date: 2019    Years since quitting: 2.1  . Smokeless tobacco: Never Used  Substance and Sexual Activity  . Alcohol use: Not Currently    Comment: quit 2019 - "I was a drunk"  . Drug use: Not Currently    Types: Marijuana    Comment: h/o drug use - "I was raised in the 60s"; last use 18 months ago; smokes marijuana periodically  . Sexual activity: Not on file  Other Topics Concern  . Not on file  Social History Narrative  . Not on file   Social Determinants of Health   Financial Resource Strain:   . Difficulty of Paying Living Expenses: Not on file  Food Insecurity:   . Worried About Charity fundraiser in the Last Year: Not on file  . Ran Out of Food in the Last Year: Not on file  Transportation Needs:   . Lack of Transportation (Medical): Not on file  . Lack of Transportation (Non-Medical): Not on file  Physical Activity:   . Days of Exercise per Week: Not on file  . Minutes of Exercise per Session: Not on file  Stress:   . Feeling of Stress : Not on file  Social Connections:   . Frequency of Communication with Friends and Family: Not on file  . Frequency of Social Gatherings with Friends and Family: Not on file  . Attends Religious Services: Not on file  . Active Member of Clubs or  Organizations: Not on file  . Attends Archivist Meetings: Not on file  . Marital Status: Not on file  Intimate Partner Violence:   . Fear of Current or Ex-Partner: Not on file  . Emotionally Abused: Not on file  . Physically Abused: Not on file  . Sexually Abused: Not on file    Allergies   Cyclobenzaprine, Morphine, Penicillins, Ambien [zolpidem], Codeine, and Hydromorphone  Family history:   No family history on file.  Current Medications:   Prior to Admission medications   Medication Sig Start  Date End Date Taking? Authorizing Provider  albuterol (PROVENTIL) (2.5 MG/3ML) 0.083% nebulizer solution Take 2.5 mg by nebulization every 6 (six) hours as needed for wheezing or shortness of breath.    [provider]  allopurinol (ZYLOPRIM) 100 MG tablet Take 100 mg by mouth daily. 02/18/18   [provider]  amLODipine (NORVASC) 10 MG tablet Take 10 mg by mouth daily.    [provider]  aspirin EC 81 MG tablet Take 81 mg by mouth daily.    [provider]  AURYXIA 1 GM 210 MG(Fe) tablet Take 420 mg by mouth See admin instructions. Take 3 tablets (630 mg) by mouth with each meal & take 2 tablets (420 mg) by mouth with each snack 08/16/18   [provider]  Carboxymethylcellul-Glycerin (LUBRICATING EYE DROPS OP) Place 1 drop into both eyes daily as needed (dry eyes).    [provider]  Cholecalciferol (VITAMIN D3) 50 MCG (2000 UT) capsule Take 2,000 Units by mouth daily.     [provider]  dicyclomine (BENTYL) 10 MG capsule Take 20 mg by mouth at bedtime.  08/27/17   [provider]  docusate sodium (COLACE) 100 MG capsule Take 1 capsule (100 mg total) by mouth 2 (two) times daily. 08/11/18   Hongalgi, Lenis Dickinson, MD  doxylamine, Sleep, (UNISOM) 25 MG tablet Take 25 mg by mouth at bedtime as needed for sleep.    [provider]  furosemide (LASIX) 40 MG tablet 40 mg lasix on non-HD days Patient taking  differently: Take 20 mg by mouth See admin instructions. daily on non-HD days 03/01/19   Geradine Girt, DO  gabapentin (NEURONTIN) 300 MG capsule Take 1 capsule (300 mg total) by mouth at bedtime. 02/23/19   Mercy Riding, MD  Ipratropium-Albuterol (COMBIVENT) 20-100 MCG/ACT AERS respimat Inhale 2 puffs into the lungs 4 (four) times daily as needed for wheezing. 09/11/17   [provider]  isosorbide mononitrate (IMDUR) 30 MG 24 hr tablet Take 30 mg by mouth daily. 08/17/18   [provider]  lidocaine-prilocaine (EMLA) cream Apply 1 application topically daily as needed (1-2 hours before dialysis (cover with occlusive dressing)).  09/03/18   [provider]  metoprolol tartrate (LOPRESSOR) 100 MG tablet Take 1 tablet (100 mg total) by mouth 2 (two) times daily. 01/26/19   Little Ishikawa, MD  nitroGLYCERIN (NITROSTAT) 0.4 MG SL tablet Place 0.4 mg under the tongue every 5 (five) minutes as needed for chest pain.  09/18/16   [provider]  ondansetron (ZOFRAN) 4 MG tablet Take 4 mg by mouth 3 (three) times daily as needed for nausea. 09/14/18   [provider]  pantoprazole (PROTONIX) 40 MG tablet Take 1 tablet (40 mg total) by mouth daily. Patient taking differently: Take 40 mg by mouth daily as needed (acid reflux/indigestion.).  08/11/18   Hongalgi, Lenis Dickinson, MD  polyethylene glycol (MIRALAX / GLYCOLAX) 17 g packet Take 17 g by mouth 2 (two) times daily. Patient taking differently: Take 17 g by mouth every other day.  08/11/18   Hongalgi, Lenis Dickinson, MD  pravastatin (PRAVACHOL) 80 MG tablet Take 80 mg by mouth every evening.  06/30/16   [provider]  sennosides-docusate sodium (SENOKOT-S) 8.6-50 MG tablet Take 1 tablet by mouth 2 (two) times daily.     [provider]  SYMBICORT 160-4.5 MCG/ACT inhaler Inhale 2 puffs into the lungs every 4 (four) hours as needed (for wheezing).  09/16/18   [provider]    Physical Exam:    Vitals:   03/19/19 1545 03/19/19 1600 03/19/19 1628 03/19/19 1645  BP: (!) 157/99   (!) 114/95  Pulse: 67     Resp: (!) 23   18  Temp:   98.4 F (36.9 C)   TempSrc:   Oral   SpO2: 97% 94%    Weight:      Height:         Physical Exam: Blood pressure (!) 114/95, pulse 67, temperature 98.4 F (36.9 C), temperature source Oral, resp. rate 18, height 5\' 2"  (1.575 m), weight 45.4 kg, SpO2 94 %. Gen: Chronically ill-appearing female looking much older than stated age lying flat in bed with very poor dentition in no acute distress but then occasionally clutching her abdomen and rolling to her side and pain which improves if not resolves with distraction. Eyes: sclera anicteric, conjuctiva widely injected bilaterally CVS: Regular, H0-Q6, 2/6 systolic murmur left sternal border  respiratory: Decreased air entry bilaterally with no adventitious sounds  GI: Patient does have normoactive bowel sounds.  Her abdomen is soft.  It is very difficult to get a reliable understanding of abdominal pain.  She seems to have abdominal pain to deep palpation intermittently throughout but then does not have it with distraction.  No rebound tenderness.  She does seem to have some tenderness of the right upper quadrant but also has tenderness in the left upper quadrant and bilateral lower quadrants.  Murphy sign is negative. LE: No edema. No cyanosis Neuro: A/O x 3, Moving all extremities equally with normal strength, CN 3-12 intact, grossly nonfocal.  Psych: Patient is cooperative and friendly.  Insight and judgment are extremely poor.  Mood is appropriate.   Data Review:    Labs: Basic Metabolic Panel: Recent Labs  Lab 03/18/19 0141 03/18/19 0224 03/19/19 1400  NA 131* 130* 134*  K 4.7 4.8 5.3*  CL 92* 95* 94*  CO2 21*  --  19*  GLUCOSE 145* 139* 63*  BUN 68* 65* 89*  CREATININE 6.65* 7.50* 7.17*  CALCIUM 7.7*  --  8.5*   Liver Function Tests: Recent Labs  Lab 03/18/19 0141 03/19/19 1400   AST 19 24  ALT 16 17  ALKPHOS 65 85  BILITOT 0.6 0.6  PROT 5.9* 6.5  ALBUMIN 3.1* 3.3*   Recent Labs  Lab 03/18/19 0141 03/19/19 1400  LIPASE 41 49   No results for input(s): AMMONIA in the last 168 hours. CBC: Recent Labs  Lab 03/18/19 0141 03/18/19 0224 03/19/19 1400  WBC 10.1  --  13.0*  NEUTROABS 9.0*  --   --   HGB 8.1* 9.2* 9.6*  HCT 25.6* 27.0* 32.2*  MCV 96.6  --  101.9*  PLT 330  --  330   Cardiac Enzymes: No results for input(s): CKTOTAL, CKMB, CKMBINDEX, TROPONINI in the last 168 hours.  BNP (last 3 results) No results for input(s): PROBNP in the last 8760 hours. CBG: No results for input(s): GLUCAP in the last 168 hours.  Urinalysis    Component Value Date/Time   COLORURINE STRAW (A) 03/18/2019 0530   APPEARANCEUR CLEAR 03/18/2019 0530   LABSPEC 1.006 03/18/2019 0530   PHURINE 7.0 03/18/2019 0530   GLUCOSEU 50 (A) 03/18/2019 0530   HGBUR NEGATIVE 03/18/2019 0530   BILIRUBINUR NEGATIVE 03/18/2019 0530   KETONESUR NEGATIVE 03/18/2019 0530   PROTEINUR 30 (A) 03/18/2019 0530   NITRITE NEGATIVE 03/18/2019 0530   LEUKOCYTESUR NEGATIVE 03/18/2019 0530  Radiographic Studies: DG Chest Port 1 View  Result Date: 03/19/2019 CLINICAL DATA:  Chest pain since last night with shortness of breath. Missed dialysis today and this past Tuesday. EXAM: PORTABLE CHEST 1 VIEW COMPARISON:  03/09/2019 FINDINGS: Right IJ dialysis catheter unchanged. Lungs are hyperexpanded without lobar consolidation or effusion. Subtle hazy prominence of the central pulmonary vessels likely minimal vascular congestion. Cardiomediastinal silhouette and remainder of the exam is unchanged. IMPRESSION: Suggestion of minimal vascular congestion. Electronically Signed   By: Marin Olp M.D.   On: 03/19/2019 13:41   DG Abd Portable 1 View  Result Date: 03/18/2019 CLINICAL DATA:  Question small bowel obstruction. Clinical notes states abdominal pain. Vomiting. EXAM: PORTABLE ABDOMEN - 1  VIEW COMPARISON:  CT 03/09/2019 at [facility] , 9 days prior. Patient is also had abdominal CT 03/06/2019, 02/27/2019, and 02/21/2019. FINDINGS: There is contrast within the descending and sigmoid colon from prior radiologic exam. Small volume of stool in the ascending colon. Air-filled prominent but nondilated small bowel in the left abdomen. No abnormal gastric distension. Vascular calcifications. Renal calcifications on CT not well visualized radiographically. No evidence of free air. No acute osseous abnormalities are seen. IMPRESSION: No evidence of small-bowel obstruction or acute findings. There is enteric contrast within the descending and sigmoid colon from prior radiologic exam. Patient has had 4 abdominal CTs in the last 4 weeks, most recent CT was 03/09/2019 at [facility]. Electronically Signed   By: Keith Rake M.D.   On: 03/18/2019 03:07   CT Angio Chest/Abd/Pel for Dissection W and/or Wo Contrast  Result Date: 03/19/2019 CLINICAL DATA:  69 year old female with history of chest and back pain. Suspected aortic dissection. EXAM: CT ANGIOGRAPHY CHEST, ABDOMEN AND PELVIS TECHNIQUE: Multidetector CT imaging through the chest, abdomen and pelvis was performed using the standard protocol during bolus administration of intravenous contrast. Multiplanar reconstructed images and MIPs were obtained and reviewed to evaluate the vascular anatomy. CONTRAST:  8mL OMNIPAQUE IOHEXOL 350 MG/ML SOLN COMPARISON:  CT the abdomen and pelvis 03/09/2019. CT the abdomen and pelvis 02/27/2019. Chest CT 04/05/2018. FINDINGS: CTA CHEST FINDINGS Cardiovascular: Noncontrast images demonstrate no crescentic high attenuation associated with the thoracic aorta to suggest acute intramural hematoma. Postcontrast images demonstrate no definite aortic dissection. There is extensive irregular atheromatous plaque throughout the thoracic aorta with multiple areas of ulceration, but no frank dissection or penetration.  Heart size is normal. There is no significant pericardial fluid, thickening or pericardial calcification. There is aortic atherosclerosis, as well as atherosclerosis of the great vessels of the mediastinum and the coronary arteries, including calcified atherosclerotic plaque in the left main, left anterior descending, left circumflex and right coronary arteries. Calcifications of the mitral annulus. Right internal jugular PermCath with tip terminating in the right atrium. Mediastinum/Nodes: No pathologically enlarged mediastinal or hilar lymph nodes. Esophagus is unremarkable in appearance. No axillary lymphadenopathy. Lungs/Pleura: A few scattered tiny 2-3 mm pulmonary nodules are noted throughout the lungs bilaterally. No other larger more suspicious appearing pulmonary nodules or masses are noted. Mild diffuse bronchial wall thickening with mild centrilobular and paraseptal emphysema. Linear scarring or subsegmental atelectasis in the dependent portion of the left lower lobe. Musculoskeletal: There are no aggressive appearing lytic or blastic lesions noted in the visualized portions of the skeleton. Review of the MIP images confirms the above findings. CTA ABDOMEN AND PELVIS FINDINGS VASCULAR Aorta: Normal caliber aorta without aneurysm, dissection, vasculitis or significant stenosis. However, there is extensive atheromatous plaque, much of which is highly irregular with several  areas of ulcerative plaque throughout the abdominal aorta. Celiac: Ostium of the vessel is not confidently identified and is either severely stenotic or completely occluded. However, distal aspects of the vessel and its major branches are patent, potentially via collateral flow. SMA: Patent with mild stenosis proximally (2 mm) followed by poststenotic dilatation (7 mm). Renals: Both renal arteries are patent without evidence of aneurysm, dissection, vasculitis, fibromuscular dysplasia or significant stenosis. IMA: Ostium of the vessel is  not confidently identified, potentially completely occluded. Distal aspect of the vessel is patent, presumably fed by collateral pathways. Inflow: Patent without evidence of aneurysm, dissection, vasculitis or significant stenosis. Veins: No obvious venous abnormality within the limitations of this arterial phase study. Review of the MIP images confirms the above findings. NON-VASCULAR Hepatobiliary: No suspicious cystic or solid hepatic lesions. No intra or extrahepatic biliary ductal dilatation. Gallbladder wall appears thickened and edematous (up to 1 cm in thickness). Gallbladder is only mildly distended. No calcified gallstones identified. Pancreas: No pancreatic mass. No pancreatic ductal dilatation. No pancreatic or peripancreatic fluid collections or inflammatory changes. Spleen: Unremarkable. Adrenals/Urinary Tract: 2.8 x 2.4 cm low-attenuation (-2 HU) left adrenal nodule (axial image 155 of series 7), compatible with an adenoma. 2.5 x 1.9 cm right adrenal nodule which demonstrates avid enhancement (axial image 151 of series 7). Multiple calcifications associated with the renal hila bilaterally, favored to be vascular, although nonobstructive calculi are not excluded. Subcentimeter low-attenuation lesions in both kidneys, too small to characterize, but statistically likely to represent cysts. 1.5 cm low-attenuation lesion in the upper pole the left kidney, compatible with a simple cyst. No suspicious renal lesions. Mild bilateral renal atrophy. No hydroureteronephrosis. Urinary bladder is nearly completely decompressed around an indwelling Foley balloon catheter. Stomach/Bowel: Normal appearance of the stomach. No pathologic dilatation of small bowel or colon. The appendix is not confidently identified and may be surgically absent. Regardless, there are no inflammatory changes noted adjacent to the cecum to suggest the presence of an acute appendicitis at this time. Lymphatic: No lymphadenopathy noted in  the abdomen or pelvis. Reproductive: Status post hysterectomy. Ovaries are not confidently identified may be surgically absent or atrophic. Other: No significant volume of ascites.  No pneumoperitoneum. Musculoskeletal: There are no aggressive appearing lytic or blastic lesions noted in the visualized portions of the skeleton. Review of the MIP images confirms the above findings. IMPRESSION: 1. 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 2. Gallbladder wall thickening and edema. No calcified gallstones. Gallbladder is only mildly distended. Overall, findings are equivocal for acute cholecystitis, but further clinical evaluation is recommended. If there is clinical concern for acute cholecystitis, further evaluation with right upper quadrant ultrasound would be recommended. 3. 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 4. Bilateral adrenal nodules, similar to prior studies. The left adrenal nodule is compatible with a benign adenoma. The avidly enhancing right adrenal nodule is incompletely characterized, but appears relatively stable in size compared to numerous prior examinations, suggesting a benign lesion. 5. There is also left main and 3 vessel coronary artery disease. Please note that although the presence of coronary artery calcium documents the presence of coronary artery disease, the severity of this disease and any potential stenosis cannot be assessed on this non-gated CT examination. Assessment for potential risk factor modification, dietary therapy or pharmacologic therapy may  be warranted, if clinically indicated. 6. Scattered tiny 2-3 mm pulmonary nodules noted in the lungs bilaterally, nonspecific, but statistically likely benign. No follow-up needed  if patient is low-risk (and has no known or suspected primary neoplasm). Non-contrast chest CT can be considered in 12 months if patient is high-risk. This recommendation follows the consensus statement: Guidelines for Management of Incidental Pulmonary Nodules Detected on CT Images: From the Fleischner Society 2017; Radiology 2017; 284:228-243. 7. Additional incidental findings, as above. Electronically Signed   By: Vinnie Langton M.D.   On: 03/19/2019 15:55    EKG: Independently reviewed.  Sinus Christina at 60.  Normal intervals.  Normal axis.  She does have T wave inversions V1 and V2.  Nonspecific ST-T wave changes without change from previous.   Assessment/Plan:   Principal Problem:   Abdominal pain Active Problems:   ESRD (end stage renal disease) (HCC)   Bipolar affective (HCC)   CAD (coronary artery disease), native coronary artery   COPD with chronic bronchitis (HCC)   Afib (HCC)   Non-compliance with renal dialysis [facility])  69 year old female with history of noncompliance with renal dialysis presents with abdominal and chest pain.  It is very difficult to know how to evaluate this patient as patient has presented in the past with abdominal pain after having missed dialysis which resolves with dialysis.  Abdominal exam is very hard to interpret as it is inconsistent and changes with distraction.  However patient does have significant cardiac calcifications with known three-vessel disease and left main disease and she also does have some gallbladder thickening.  Chest pain Atypical sounding chest pain in a patient with a very real three-vessel and left main disease. EKG is essentially normal. Initial troponin is not elevated. I do not see however that patient has had a recent stress test or other functional study. Emergent cardiology consultation is not warranted tonight however this can be done either as an inpatient or outpatient. I am continuing patient's aspirin, metoprolol and  Imdur  Abdominal pain Patient has had abdominal pain after missing dialysis which is resolved with dialysis in the past thought to be potentially a symptom of uremia. However she does have some gallbladder thickening on CT. Of note she also does have a rising white count over the past couple of weeks. She is afebrile. Agree with continuing Zosyn. Right upper quadrant ultrasound requested. Can consider HIDA if warranted.  ESRD having missed dialysis Patient is to be dialyzed this evening per ED report.  Hypertension.  Med rec has not yet been done I am however continuing patient's metoprolol and Imdur The rest of her blood pressure medications can be restarted as warranted once her medicines have been confirmed  History of A. fib.  EKG revealed normal sinus Christina.  She does not appear to be on any anticoagulation as an outpatient very possibly secondary to patient's known noncompliance On metoprolol for rate control    Other information:   DVT prophylaxis: SCD ordered. Code Status: DNR, confirmed with patient again today Family Communication: Patient states no need to contact family Disposition Plan: Home Consults called: Renal, general surgery Admission status: Observation  Christina [name] [facility]  If 7PM-7AM, please contact night-coverage [email or web address] Password [facility] 03/19/2019, 5:41 PM

## 2019-03-19 NOTE — ED Notes (Signed)
[name] ([phone]) called/would like a call back from patient.

## 2019-03-19 NOTE — Consult Note (Addendum)
CC: Abdominal pain  Requesting provider: dr Reather Converse  HPI: Christina [name] is an 69 y.o. female who is here for abdominal pain as well as chest pain for the past several days.  Most of the history is obtained from the chart as well as from notes from other medical providers because when I saw her this evening the patient even though she is oriented x3 was confused at times and difficult to understand.  She will easily fall asleep while talking to her  She reportedly complained of intermittent upper abdominal and lower chest pain for the past several days it was intermittent.  She states that she would have nausea with it and some occasional emesis.  She also complained of some lower abdominal pain when I saw her this evening.  No particular pattern she states but another provider documented that sometimes this occurs around dialysis especially if she misses a dialysis appointment.  She denies any fevers or chills.  No melena or hematochezia.  She missed her dialysis appointment yesterday.  She states that she does not smoke.  Comorbidities include end-stage renal disease on HD, hypertension, A. fib, coronary artery disease, COPD; history of SBO  She has a midline incision and when I asked her what surgery was for I really cannot understand what she is saying  Past Medical History:  Diagnosis Date  . Anemia   . Anxiety   . Arthritis   . Asthma   . Atrial fibrillation ([place])   . Chronic kidney disease    Dialysis T/Th/Sa  started in March 2020  . COPD (chronic obstructive pulmonary disease) ([place])   . Coronary artery disease    2 stents  . Depression   . GERD (gastroesophageal reflux disease)   . Gout   . Headache    migraines  . History of kidney stones   . Hyperlipidemia   . Hypertension   . Pneumonia   . Small bowel obstruction [facility])     Past Surgical History:  Procedure Laterality Date  . ABDOMINAL HYSTERECTOMY    . ABDOMINAL SURGERY     for small bowel obstruction - x 2    . APPENDECTOMY    . AV FISTULA PLACEMENT Left 08/04/2018   Procedure: ARTERIOVENOUS (AV) FISTULA CREATION LEFT ARM;  Surgeon: Waynetta Sandy, MD;  Location: [facility];  Service: Vascular;  Laterality: Left;  . BASCILIC VEIN TRANSPOSITION Left 11/24/2018   Procedure: SECOND STAGE BASILIC VEIN TRANSPOSITION LEFT ARM;  Surgeon: Waynetta Sandy, MD;  Location: [facility];  Service: Vascular;  Laterality: Left;  . CARDIAC CATHETERIZATION    . CORONARY ANGIOPLASTY  ?2003/2004  . FACIAL RECONSTRUCTION SURGERY     x 2  . HERNIA REPAIR      No family history on file.  Social:  reports that she quit smoking about 2 years ago. She has a 25.00 pack-year smoking history. She has never used smokeless tobacco. She reports previous alcohol use. She reports previous drug use. Drug: Marijuana.  Allergies:  Allergies  Allergen Reactions  . Cyclobenzaprine Anaphylaxis and Other (See Comments)    "stopped heart"   . Morphine Anaphylaxis    "stopped heart"  . Penicillins Shortness Of Breath, Swelling and Palpitations    Did it involve swelling of the face/tongue/throat, SOB, or low BP? Yes Did it involve sudden or severe rash/hives, skin peeling, or any reaction on the inside of your mouth or nose? No Did you need to seek medical attention at a hospital or  doctor's office? No When did it last happen?years  If all above answers are "NO", may proceed with cephalosporin use.   . Ambien [Zolpidem] Other (See Comments)    Severe confusion  . Codeine Itching and Rash  . Hydromorphone Other (See Comments)    If administered quickly, felt like hand was "exploding"     Medications: I have reviewed the patient's current medications.  Results for orders placed or performed during the hospital encounter of 03/19/19 (from the past 48 hour(s))  CBC     Status: Abnormal   Collection Time: 03/19/19  2:00 PM  Result Value Ref Range   WBC 13.0 (H) 4.0 - 10.5 K/uL   RBC 3.16 (L) 3.87 - 5.11  MIL/uL   Hemoglobin 9.6 (L) 12.0 - 15.0 g/dL   HCT 32.2 (L) 36.0 - 46.0 %   MCV 101.9 (H) 80.0 - 100.0 fL   MCH 30.4 26.0 - 34.0 pg   MCHC 29.8 (L) 30.0 - 36.0 g/dL   RDW 17.0 (H) 11.5 - 15.5 %   Platelets 330 150 - 400 K/uL   nRBC 0.2 0.0 - 0.2 %    Comment: Performed at [facility], [address], [place], [place] [address]  Comprehensive metabolic panel     Status: Abnormal   Collection Time: 03/19/19  2:00 PM  Result Value Ref Range   Sodium 134 (L) 135 - 145 mmol/L   Potassium 5.3 (H) 3.5 - 5.1 mmol/L   Chloride 94 (L) 98 - 111 mmol/L   CO2 19 (L) 22 - 32 mmol/L   Glucose, Bld 63 (L) 70 - 99 mg/dL   BUN 89 (H) 8 - 23 mg/dL   Creatinine, Ser 7.17 (H) 0.44 - 1.00 mg/dL   Calcium 8.5 (L) 8.9 - 10.3 mg/dL   Total Protein 6.5 6.5 - 8.1 g/dL   Albumin 3.3 (L) 3.5 - 5.0 g/dL   AST 24 15 - 41 U/L   ALT 17 0 - 44 U/L   Alkaline Phosphatase 85 38 - 126 U/L   Total Bilirubin 0.6 0.3 - 1.2 mg/dL   GFR calc non Af Amer 5 (L) >60 mL/min   GFR calc Af Amer 6 (L) >60 mL/min   Anion gap 21 (H) 5 - 15    Comment: Performed at [facility], [place] [address] [place], [place] [address]  Lipase, blood     Status: None   Collection Time: 03/19/19  2:00 PM  Result Value Ref Range   Lipase 49 11 - 51 U/L    Comment: Performed at [facility] [address], [place], [place] [address]  Troponin I (High Sensitivity)     Status: Abnormal   Collection Time: 03/19/19  2:00 PM  Result Value Ref Range   Troponin I (High Sensitivity) 32 (H) <18 ng/L    Comment: (NOTE) Elevated high sensitivity troponin I (hsTnI) values and significant  changes across serial measurements may suggest ACS but many other  chronic and acute conditions are known to elevate hsTnI results.  Refer to the "Links" section for chest pain algorithms and additional  guidance. Performed at [facility], [place] [address] [place], [place] [address]   Troponin I (High Sensitivity)     Status: Abnormal    Collection Time: 03/19/19  4:59 PM  Result Value Ref Range   Troponin I (High Sensitivity) 30 (H) <18 ng/L    Comment: (NOTE) Elevated high sensitivity troponin I (hsTnI) values  and significant  changes across serial measurements may suggest ACS but many other  chronic and acute conditions are known to elevate hsTnI results.  Refer to the "Links" section for chest pain algorithms and additional  guidance. Performed at [facility], [place] [address], [place], [place] [address]   Respiratory Panel by RT PCR (Flu A&B, Covid) - Nasopharyngeal Swab     Status: None   Collection Time: 03/19/19  7:40 PM   Specimen: Nasopharyngeal Swab  Result Value Ref Range   SARS Coronavirus 2 by RT PCR NEGATIVE NEGATIVE    Comment: (NOTE) SARS-CoV-2 target nucleic acids are NOT DETECTED. The SARS-CoV-2 RNA is generally detectable in upper respiratoy specimens during the acute phase of infection. The lowest concentration of SARS-CoV-2 viral copies this assay can detect is 131 copies/mL. A negative result does not preclude SARS-Cov-2 infection and should not be used as the sole basis for treatment or other patient management decisions. A negative result may occur with  improper specimen collection/handling, submission of specimen other than nasopharyngeal swab, presence of viral mutation(s) within the areas targeted by this assay, and inadequate number of viral copies (<131 copies/mL). A negative result must be combined with clinical observations, patient history, and epidemiological information. The expected result is Negative. Fact Sheet for Patients:  PinkCheek.be Fact Sheet for Healthcare Providers:  GravelBags.it This test is not yet ap proved or cleared by the [place] FDA and  has been authorized for detection and/or diagnosis of SARS-CoV-2 by FDA under an Emergency Use Authorization (EUA). This EUA will remain  in effect  (meaning this test can be used) for the duration of the COVID-19 declaration under Section 564(b)(1) of the Act, 21 U.S.C. section 360bbb-3(b)(1), unless the authorization is terminated or revoked sooner.    Influenza A by PCR NEGATIVE NEGATIVE   Influenza B by PCR NEGATIVE NEGATIVE    Comment: (NOTE) The Xpert Xpress SARS-CoV-2/FLU/RSV assay is intended as an aid in  the diagnosis of influenza from Nasopharyngeal swab specimens and  should not be used as a sole basis for treatment. Nasal washings and  aspirates are unacceptable for Xpert Xpress SARS-CoV-2/FLU/RSV  testing. Fact Sheet for Patients: PinkCheek.be Fact Sheet for Healthcare Providers: GravelBags.it This test is not yet approved or cleared by the [place] FDA and  has been authorized for detection and/or diagnosis of SARS-CoV-2 by  FDA under an Emergency Use Authorization (EUA). This EUA will remain  in effect (meaning this test can be used) for the duration of the  Covid-19 declaration under Section 564(b)(1) of the Act, 21  U.S.C. section 360bbb-3(b)(1), unless the authorization is  terminated or revoked. Performed at [facility], [place] [address], [place], [place] [address]     DG Chest Port 1 View  Result Date: 03/19/2019 CLINICAL DATA:  Chest pain since last night with shortness of breath. Missed dialysis today and this past Tuesday. EXAM: PORTABLE CHEST 1 VIEW COMPARISON:  03/09/2019 FINDINGS: Right IJ dialysis catheter unchanged. Lungs are hyperexpanded without lobar consolidation or effusion. Subtle hazy prominence of the central pulmonary vessels likely minimal vascular congestion. Cardiomediastinal silhouette and remainder of the exam is unchanged. IMPRESSION: Suggestion of minimal vascular congestion. Electronically Signed   By: Marin Olp M.D.   On: 03/19/2019 13:41   DG Abd Portable 1 View  Result Date: 03/18/2019 CLINICAL DATA:  Question  small bowel obstruction. Clinical notes states abdominal pain. Vomiting. EXAM: PORTABLE ABDOMEN - 1 VIEW COMPARISON:  CT 03/09/2019 at [facility] , 9 days prior. Patient is also had abdominal CT 03/06/2019, 02/27/2019, and 02/21/2019. FINDINGS: There is contrast within the descending and sigmoid colon from prior radiologic exam. Small volume of stool in the ascending colon. Air-filled prominent but nondilated small bowel in the left abdomen. No abnormal gastric distension. Vascular calcifications. Renal calcifications on CT not well visualized radiographically. No evidence of free air. No acute osseous abnormalities are seen. IMPRESSION: No evidence of small-bowel obstruction or acute findings. There is enteric contrast within the descending and sigmoid colon from prior radiologic exam. Patient has had 4 abdominal CTs in the last 4 weeks, most recent CT was 03/09/2019 at [facility]. Electronically Signed   By: Keith Rake M.D.   On: 03/18/2019 03:07   CT Angio Chest/Abd/Pel for Dissection W and/or Wo Contrast  Result Date: 03/19/2019 CLINICAL DATA:  69 year old female with history of chest and back pain. Suspected aortic dissection. EXAM: CT ANGIOGRAPHY CHEST, ABDOMEN AND PELVIS TECHNIQUE: Multidetector CT imaging through the chest, abdomen and pelvis was performed using the standard protocol during bolus administration of intravenous contrast. Multiplanar reconstructed images and MIPs were obtained and reviewed to evaluate the vascular anatomy. CONTRAST:  69mL OMNIPAQUE IOHEXOL 350 MG/ML SOLN COMPARISON:  CT the abdomen and pelvis 03/09/2019. CT the abdomen and pelvis 02/27/2019. Chest CT 04/05/2018. FINDINGS: CTA CHEST FINDINGS Cardiovascular: Noncontrast images demonstrate no crescentic high attenuation associated with the thoracic aorta to suggest acute intramural hematoma. Postcontrast images demonstrate no definite aortic dissection. There is extensive irregular atheromatous plaque  throughout the thoracic aorta with multiple areas of ulceration, but no frank dissection or penetration. Heart size is normal. There is no significant pericardial fluid, thickening or pericardial calcification. There is aortic atherosclerosis, as well as atherosclerosis of the great vessels of the mediastinum and the coronary arteries, including calcified atherosclerotic plaque in the left main, left anterior descending, left circumflex and right coronary arteries. Calcifications of the mitral annulus. Right internal jugular PermCath with tip terminating in the right atrium. Mediastinum/Nodes: No pathologically enlarged mediastinal or hilar lymph nodes. Esophagus is unremarkable in appearance. No axillary lymphadenopathy. Lungs/Pleura: A few scattered tiny 2-3 mm pulmonary nodules are noted throughout the lungs bilaterally. No other larger more suspicious appearing pulmonary nodules or masses are noted. Mild diffuse bronchial wall thickening with mild centrilobular and paraseptal emphysema. Linear scarring or subsegmental atelectasis in the dependent portion of the left lower lobe. Musculoskeletal: There are no aggressive appearing lytic or blastic lesions noted in the visualized portions of the skeleton. Review of the MIP images confirms the above findings. CTA ABDOMEN AND PELVIS FINDINGS VASCULAR Aorta: Normal caliber aorta without aneurysm, dissection, vasculitis or significant stenosis. However, there is extensive atheromatous plaque, much of which is highly irregular with several areas of ulcerative plaque throughout the abdominal aorta. Celiac: Ostium of the vessel is not confidently identified and is either severely stenotic or completely occluded. However, distal aspects of the vessel and its major branches are patent, potentially via collateral flow. SMA: Patent with mild stenosis proximally (2 mm) followed by poststenotic dilatation (7 mm). Renals: Both renal arteries are patent without evidence of  aneurysm, dissection, vasculitis, fibromuscular dysplasia or significant stenosis. IMA: Ostium of the vessel is not confidently identified, potentially completely occluded. Distal aspect of the vessel is patent, presumably fed by collateral pathways. Inflow: Patent without evidence of aneurysm, dissection, vasculitis or significant stenosis. Veins: No obvious venous abnormality within the limitations of this arterial phase study. Review of the MIP images confirms the above findings.  NON-VASCULAR Hepatobiliary: No suspicious cystic or solid hepatic lesions. No intra or extrahepatic biliary ductal dilatation. Gallbladder wall appears thickened and edematous (up to 1 cm in thickness). Gallbladder is only mildly distended. No calcified gallstones identified. Pancreas: No pancreatic mass. No pancreatic ductal dilatation. No pancreatic or peripancreatic fluid collections or inflammatory changes. Spleen: Unremarkable. Adrenals/Urinary Tract: 2.8 x 2.4 cm low-attenuation (-2 HU) left adrenal nodule (axial image 155 of series 7), compatible with an adenoma. 2.5 x 1.9 cm right adrenal nodule which demonstrates avid enhancement (axial image 151 of series 7). Multiple calcifications associated with the renal hila bilaterally, favored to be vascular, although nonobstructive calculi are not excluded. Subcentimeter low-attenuation lesions in both kidneys, too small to characterize, but statistically likely to represent cysts. 1.5 cm low-attenuation lesion in the upper pole the left kidney, compatible with a simple cyst. No suspicious renal lesions. Mild bilateral renal atrophy. No hydroureteronephrosis. Urinary bladder is nearly completely decompressed around an indwelling Foley balloon catheter. Stomach/Bowel: Normal appearance of the stomach. No pathologic dilatation of small bowel or colon. The appendix is not confidently identified and may be surgically absent. Regardless, there are no inflammatory changes noted adjacent to  the cecum to suggest the presence of an acute appendicitis at this time. Lymphatic: No lymphadenopathy noted in the abdomen or pelvis. Reproductive: Status post hysterectomy. Ovaries are not confidently identified may be surgically absent or atrophic. Other: No significant volume of ascites.  No pneumoperitoneum. Musculoskeletal: There are no aggressive appearing lytic or blastic lesions noted in the visualized portions of the skeleton. Review of the MIP images confirms the above findings. IMPRESSION: 1. 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 2. Gallbladder wall thickening and edema. No calcified gallstones. Gallbladder is only mildly distended. Overall, findings are equivocal for acute cholecystitis, but further clinical evaluation is recommended. If there is clinical concern for acute cholecystitis, further evaluation with right upper quadrant ultrasound would be recommended. 3. 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 4. Bilateral adrenal nodules, similar to prior studies. The left adrenal nodule is compatible with a benign adenoma. The avidly enhancing right adrenal nodule is incompletely characterized, but appears relatively stable in size compared to numerous prior examinations, suggesting a benign lesion. 5. There is also left main and 3 vessel coronary artery disease. Please note that although the presence of coronary artery calcium documents the presence of coronary artery disease, the severity of this disease and any potential stenosis cannot be assessed on this non-gated CT examination. Assessment for potential risk factor modification, dietary therapy or pharmacologic therapy may be warranted, if clinically indicated. 6. Scattered tiny 2-3 mm  pulmonary nodules noted in the lungs bilaterally, nonspecific, but statistically likely benign. No follow-up needed if patient is low-risk (and has no known or suspected primary neoplasm). Non-contrast chest CT can be considered in 12 months if patient is high-risk. This recommendation follows the consensus statement: Guidelines for Management of Incidental Pulmonary Nodules Detected on CT Images: From the Fleischner Society 2017; Radiology 2017; 284:228-243. 7. Additional incidental findings, as above. Electronically Signed   By: Vinnie Langton M.D.   On: 03/19/2019 15:55   US Abdomen Limited RUQ  Result Date: 03/19/2019 CLINICAL DATA:  Right upper quadrant pain EXAM: ULTRASOUND ABDOMEN LIMITED RIGHT UPPER QUADRANT COMPARISON:  None. FINDINGS: Gallbladder: No gallstones. Positive sonographic Percell Miller  sign noted by sonographer. Diffuse gallbladder wall thickening with small amount of pericholecystic fluid. Common bile duct: Diameter: 3 mm Liver: No focal lesion identified. Within normal limits in parenchymal echogenicity. Portal vein is patent on color Doppler imaging with normal direction of blood flow towards the liver. Other: None. IMPRESSION: Positive sonographic Murphy sign with gallbladder wall thickening and pericholecystic fluid, but no cholelithiasis. Electronically Signed   By: Ulyses Jarred M.D.   On: 03/19/2019 18:53    ROS - all of the below systems have been reviewed with the patient and positives are indicated with bold text General: chills, fever or night sweats Eyes: blurry vision or double vision ENT: epistaxis or sore throat Allergy/Immunology: itchy/watery eyes or nasal congestion Hematologic/Lymphatic: bleeding problems, blood clots or swollen lymph nodes Endocrine: temperature intolerance or unexpected weight changes Breast: new or changing breast lumps or nipple discharge Resp: cough, shortness of breath, or wheezing CV: chest pain or dyspnea on exertion GI: as per HPI GU:  dysuria,  or hematuria MSK: joint pain or joint stiffness Neuro: TIA or stroke symptoms Derm: pruritus and skin lesion changes Psych: anxiety and depression  PE Blood pressure (!) 150/92, pulse 67, temperature 99 F (37.2 C), resp. rate 18, height 5\' 2"  (1.575 m), weight 45.4 kg, SpO2 94 %. Constitutional: NAD; asleep, awakens but falls asleep at timesconversant; no deformities; chronically ill appearance Eyes: Moist conjunctiva; no lid lag; anicteric; PERRL Neck: Trachea midline; no thyromegaly Lungs: Normal respiratory effort; no tactile fremitus CV: RRR; no palpable thrills; no pitting edema GI: Abd soft, Nondistended, mild TTP throughout, +bowel sounds; old midline incision; no palpable hepatosplenomegaly MSK: no deformities, moves all extremities bilateral upper and lower extremities, no crepitus, full range of motion, gross sensation intact; no clubbing/cyanosis Psychiatric: Appropriate affect; alert and oriented x3 Lymphatic: No palpable cervical or axillary lymphadenopathy Skin: No nodules, or induration, no rash or lesions  Results for orders placed or performed during the hospital encounter of 03/19/19 (from the past 48 hour(s))  CBC     Status: Abnormal   Collection Time: 03/19/19  2:00 PM  Result Value Ref Range   WBC 13.0 (H) 4.0 - 10.5 K/uL   RBC 3.16 (L) 3.87 - 5.11 MIL/uL   Hemoglobin 9.6 (L) 12.0 - 15.0 g/dL   HCT 32.2 (L) 36.0 - 46.0 %   MCV 101.9 (H) 80.0 - 100.0 fL   MCH 30.4 26.0 - 34.0 pg   MCHC 29.8 (L) 30.0 - 36.0 g/dL   RDW 17.0 (H) 11.5 - 15.5 %   Platelets 330 150 - 400 K/uL   nRBC 0.2 0.0 - 0.2 %    Comment: Performed at [facility], [address] [place], [place] [address]  Comprehensive metabolic panel     Status: Abnormal   Collection Time: 03/19/19  2:00 PM  Result Value Ref Range   Sodium 134 (L) 135 - 145 mmol/L   Potassium 5.3 (H) 3.5 - 5.1 mmol/L   Chloride 94 (L) 98 - 111 mmol/L   CO2 19 (L) 22 - 32 mmol/L   Glucose, Bld 63 (L)  70 - 99 mg/dL   BUN 89 (H) 8 - 23 mg/dL   Creatinine, Ser 7.17 (H) 0.44 - 1.00 mg/dL   Calcium 8.5 (L) 8.9 - 10.3 mg/dL   Total Protein 6.5 6.5 - 8.1 g/dL   Albumin 3.3 (L) 3.5 - 5.0 g/dL   AST 24 15 - 41 U/L   ALT 17 0 - 44 U/L  Alkaline Phosphatase 85 38 - 126 U/L   Total Bilirubin 0.6 0.3 - 1.2 mg/dL   GFR calc non Af Amer 5 (L) >60 mL/min   GFR calc Af Amer 6 (L) >60 mL/min   Anion gap 21 (H) 5 - 15    Comment: Performed at [facility] [address] [place], [place] [address]  Lipase, blood     Status: None   Collection Time: 03/19/19  2:00 PM  Result Value Ref Range   Lipase 49 11 - 51 U/L    Comment: Performed at [facility] [address] [place], [place] [address]  Troponin I (High Sensitivity)     Status: Abnormal   Collection Time: 03/19/19  2:00 PM  Result Value Ref Range   Troponin I (High Sensitivity) 32 (H) <18 ng/L    Comment: (NOTE) Elevated high sensitivity troponin I (hsTnI) values and significant  changes across serial measurements may suggest ACS but many other  chronic and acute conditions are known to elevate hsTnI results.  Refer to the "Links" section for chest pain algorithms and additional  guidance. Performed at [facility], [place] [address] [place], [place] [address]   Troponin I (High Sensitivity)     Status: Abnormal   Collection Time: 03/19/19  4:59 PM  Result Value Ref Range   Troponin I (High Sensitivity) 30 (H) <18 ng/L    Comment: (NOTE) Elevated high sensitivity troponin I (hsTnI) values and significant  changes across serial measurements may suggest ACS but many other  chronic and acute conditions are known to elevate hsTnI results.  Refer to the "Links" section for chest pain algorithms and additional  guidance. Performed at [facility], [place] [address] [place], [place] [address]   Respiratory Panel by RT PCR (Flu A&B, Covid) - Nasopharyngeal Swab     Status: None   Collection Time: 03/19/19  7:40 PM     Specimen: Nasopharyngeal Swab  Result Value Ref Range   SARS Coronavirus 2 by RT PCR NEGATIVE NEGATIVE    Comment: (NOTE) SARS-CoV-2 target nucleic acids are NOT DETECTED. The SARS-CoV-2 RNA is generally detectable in upper respiratoy specimens during the acute phase of infection. The lowest concentration of SARS-CoV-2 viral copies this assay can detect is 131 copies/mL. A negative result does not preclude SARS-Cov-2 infection and should not be used as the sole basis for treatment or other patient management decisions. A negative result may occur with  improper specimen collection/handling, submission of specimen other than nasopharyngeal swab, presence of viral mutation(s) within the areas targeted by this assay, and inadequate number of viral copies (<131 copies/mL). A negative result must be combined with clinical observations, patient history, and epidemiological information. The expected result is Negative. Fact Sheet for Patients:  PinkCheek.be Fact Sheet for Healthcare Providers:  GravelBags.it This test is not yet ap proved or cleared by the [place] FDA and  has been authorized for detection and/or diagnosis of SARS-CoV-2 by FDA under an Emergency Use Authorization (EUA). This EUA will remain  in effect (meaning this test can be used) for the duration of the COVID-19 declaration under Section 564(b)(1) of the Act, 21 U.S.C. section 360bbb-3(b)(1), unless the authorization is terminated or revoked sooner.    Influenza A by PCR NEGATIVE NEGATIVE   Influenza B by PCR NEGATIVE NEGATIVE    Comment: (NOTE) The Xpert Xpress SARS-CoV-2/FLU/RSV assay is intended as an aid in  the diagnosis of influenza from Nasopharyngeal swab  specimens and  should not be used as a sole basis for treatment. Nasal washings and  aspirates are unacceptable for Xpert Xpress SARS-CoV-2/FLU/RSV  testing. Fact Sheet for  Patients: PinkCheek.be Fact Sheet for Healthcare Providers: GravelBags.it This test is not yet approved or cleared by the [place] FDA and  has been authorized for detection and/or diagnosis of SARS-CoV-2 by  FDA under an Emergency Use Authorization (EUA). This EUA will remain  in effect (meaning this test can be used) for the duration of the  Covid-19 declaration under Section 564(b)(1) of the Act, 21  U.S.C. section 360bbb-3(b)(1), unless the authorization is  terminated or revoked. Performed at [facility], [place] [address], [place], [place] [address]     DG Chest Port 1 View  Result Date: 03/19/2019 CLINICAL DATA:  Chest pain since last night with shortness of breath. Missed dialysis today and this past Tuesday. EXAM: PORTABLE CHEST 1 VIEW COMPARISON:  03/09/2019 FINDINGS: Right IJ dialysis catheter unchanged. Lungs are hyperexpanded without lobar consolidation or effusion. Subtle hazy prominence of the central pulmonary vessels likely minimal vascular congestion. Cardiomediastinal silhouette and remainder of the exam is unchanged. IMPRESSION: Suggestion of minimal vascular congestion. Electronically Signed   By: Marin Olp M.D.   On: 03/19/2019 13:41   DG Abd Portable 1 View  Result Date: 03/18/2019 CLINICAL DATA:  Question small bowel obstruction. Clinical notes states abdominal pain. Vomiting. EXAM: PORTABLE ABDOMEN - 1 VIEW COMPARISON:  CT 03/09/2019 at [facility] , 9 days prior. Patient is also had abdominal CT 03/06/2019, 02/27/2019, and 02/21/2019. FINDINGS: There is contrast within the descending and sigmoid colon from prior radiologic exam. Small volume of stool in the ascending colon. Air-filled prominent but nondilated small bowel in the left abdomen. No abnormal gastric distension. Vascular calcifications. Renal calcifications on CT not well visualized radiographically. No evidence of free air. No acute  osseous abnormalities are seen. IMPRESSION: No evidence of small-bowel obstruction or acute findings. There is enteric contrast within the descending and sigmoid colon from prior radiologic exam. Patient has had 4 abdominal CTs in the last 4 weeks, most recent CT was 03/09/2019 at [facility]. Electronically Signed   By: Keith Rake M.D.   On: 03/18/2019 03:07   CT Angio Chest/Abd/Pel for Dissection W and/or Wo Contrast  Result Date: 03/19/2019 CLINICAL DATA:  69 year old female with history of chest and back pain. Suspected aortic dissection. EXAM: CT ANGIOGRAPHY CHEST, ABDOMEN AND PELVIS TECHNIQUE: Multidetector CT imaging through the chest, abdomen and pelvis was performed using the standard protocol during bolus administration of intravenous contrast. Multiplanar reconstructed images and MIPs were obtained and reviewed to evaluate the vascular anatomy. CONTRAST:  14mL OMNIPAQUE IOHEXOL 350 MG/ML SOLN COMPARISON:  CT the abdomen and pelvis 03/09/2019. CT the abdomen and pelvis 02/27/2019. Chest CT 04/05/2018. FINDINGS: CTA CHEST FINDINGS Cardiovascular: Noncontrast images demonstrate no crescentic high attenuation associated with the thoracic aorta to suggest acute intramural hematoma. Postcontrast images demonstrate no definite aortic dissection. There is extensive irregular atheromatous plaque throughout the thoracic aorta with multiple areas of ulceration, but no frank dissection or penetration. Heart size is normal. There is no significant pericardial fluid, thickening or pericardial calcification. There is aortic atherosclerosis, as well as atherosclerosis of the great vessels of the mediastinum and the coronary arteries, including calcified atherosclerotic plaque in the left main, left anterior descending, left circumflex and right coronary arteries. Calcifications of the mitral annulus. Right internal jugular PermCath with tip terminating in the right atrium. Mediastinum/Nodes: No  pathologically enlarged mediastinal or hilar lymph nodes. Esophagus is unremarkable in appearance. No axillary lymphadenopathy. Lungs/Pleura: A few scattered tiny 2-3 mm pulmonary nodules are noted throughout the lungs bilaterally. No other larger more suspicious appearing pulmonary nodules or masses are noted. Mild diffuse bronchial wall thickening with mild centrilobular and paraseptal emphysema. Linear scarring or subsegmental atelectasis in the dependent portion of the left lower lobe. Musculoskeletal: There are no aggressive appearing lytic or blastic lesions noted in the visualized portions of the skeleton. Review of the MIP images confirms the above findings. CTA ABDOMEN AND PELVIS FINDINGS VASCULAR Aorta: Normal caliber aorta without aneurysm, dissection, vasculitis or significant stenosis. However, there is extensive atheromatous plaque, much of which is highly irregular with several areas of ulcerative plaque throughout the abdominal aorta. Celiac: Ostium of the vessel is not confidently identified and is either severely stenotic or completely occluded. However, distal aspects of the vessel and its major branches are patent, potentially via collateral flow. SMA: Patent with mild stenosis proximally (2 mm) followed by poststenotic dilatation (7 mm). Renals: Both renal arteries are patent without evidence of aneurysm, dissection, vasculitis, fibromuscular dysplasia or significant stenosis. IMA: Ostium of the vessel is not confidently identified, potentially completely occluded. Distal aspect of the vessel is patent, presumably fed by collateral pathways. Inflow: Patent without evidence of aneurysm, dissection, vasculitis or significant stenosis. Veins: No obvious venous abnormality within the limitations of this arterial phase study. Review of the MIP images confirms the above findings. NON-VASCULAR Hepatobiliary: No suspicious cystic or solid hepatic lesions. No intra or extrahepatic biliary ductal  dilatation. Gallbladder wall appears thickened and edematous (up to 1 cm in thickness). Gallbladder is only mildly distended. No calcified gallstones identified. Pancreas: No pancreatic mass. No pancreatic ductal dilatation. No pancreatic or peripancreatic fluid collections or inflammatory changes. Spleen: Unremarkable. Adrenals/Urinary Tract: 2.8 x 2.4 cm low-attenuation (-2 HU) left adrenal nodule (axial image 155 of series 7), compatible with an adenoma. 2.5 x 1.9 cm right adrenal nodule which demonstrates avid enhancement (axial image 151 of series 7). Multiple calcifications associated with the renal hila bilaterally, favored to be vascular, although nonobstructive calculi are not excluded. Subcentimeter low-attenuation lesions in both kidneys, too small to characterize, but statistically likely to represent cysts. 1.5 cm low-attenuation lesion in the upper pole the left kidney, compatible with a simple cyst. No suspicious renal lesions. Mild bilateral renal atrophy. No hydroureteronephrosis. Urinary bladder is nearly completely decompressed around an indwelling Foley balloon catheter. Stomach/Bowel: Normal appearance of the stomach. No pathologic dilatation of small bowel or colon. The appendix is not confidently identified and may be surgically absent. Regardless, there are no inflammatory changes noted adjacent to the cecum to suggest the presence of an acute appendicitis at this time. Lymphatic: No lymphadenopathy noted in the abdomen or pelvis. Reproductive: Status post hysterectomy. Ovaries are not confidently identified may be surgically absent or atrophic. Other: No significant volume of ascites.  No pneumoperitoneum. Musculoskeletal: There are no aggressive appearing lytic or blastic lesions noted in the visualized portions of the skeleton. Review of the MIP images confirms the above findings. IMPRESSION: 1. Although there is extensive severe atherosclerosis throughout the thoracoabdominal aorta, there  is no evidence to suggest acute aortic syndrome at this time. 2. Gallbladder wall thickening and edema. No calcified gallstones. Gallbladder is only mildly distended. Overall, findings are equivocal for acute cholecystitis, but further clinical evaluation is recommended. If there is clinical concern for acute cholecystitis, further evaluation with right upper quadrant ultrasound would be recommended. 3.  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 4. Bilateral adrenal nodules, similar to prior studies. The left adrenal nodule is compatible with a benign adenoma. The avidly enhancing right adrenal nodule is incompletely characterized, but appears relatively stable in size compared to numerous prior examinations, suggesting a benign lesion. 5. There is also left main and 3 vessel coronary artery disease. Please note that although the presence of coronary artery calcium documents the presence of coronary artery disease, the severity of this disease and any potential stenosis cannot be assessed on this non-gated CT examination. Assessment for potential risk factor modification, dietary therapy or pharmacologic therapy may be warranted, if clinically indicated. 6. Scattered tiny 2-3 mm pulmonary nodules noted in the lungs bilaterally, nonspecific, but statistically likely benign. No follow-up needed if patient is low-risk (and has no known or suspected primary neoplasm). Non-contrast chest CT can be considered in 12 months if patient is high-risk. This recommendation follows the consensus statement: Guidelines for Management of Incidental Pulmonary Nodules Detected on CT Images: From the Fleischner Society 2017; Radiology 2017; [id number]. 7. Additional incidental findings, as above. Electronically Signed   By: Vinnie Langton M.D.   On: 03/19/2019 15:55   US Abdomen Limited  RUQ  Result Date: 03/19/2019 CLINICAL DATA:  Right upper quadrant pain EXAM: ULTRASOUND ABDOMEN LIMITED RIGHT UPPER QUADRANT COMPARISON:  None. FINDINGS: Gallbladder: No gallstones. Positive sonographic Murphy sign noted by sonographer. Diffuse gallbladder wall thickening with small amount of pericholecystic fluid. Common bile duct: Diameter: 3 mm Liver: No focal lesion identified. Within normal limits in parenchymal echogenicity. Portal vein is patent on color Doppler imaging with normal direction of blood flow towards the liver. Other: None. IMPRESSION: Positive sonographic Murphy sign with gallbladder wall thickening and pericholecystic fluid, but no cholelithiasis. Electronically Signed   By: Ulyses Jarred M.D.   On: 03/19/2019 18:53    Imaging: Personally reviewed  A/P: Christina [name] is an 69 y.o. female with nausea, and abdominal pain End-stage renal disease on HD, history of noncompliance with dialysis appointments Coronary artery disease Hypertension A. Fib COPD History of SBO Bilateral adrenal nodules likely benign Severe abdominal atherosclerotic disease Gallbladder wall thickening   Differential includes acalculus cholecystitis versus peptic ulcer disease versus intestinal angina from her severe atherosclerotic disease within her abdominal arteries  She had an ER visit and an admission about 3 weeks ago for similar complaints.  On the CT scan at that time she had some duodenal wall thickening.  She does not have a surgical abdomen.  She has a mild leukocytosis.  LFTs are normal.    Recommend nuclear medicine scan of her gallbladder on Sunday to evaluate for acalculus cholecystitis Empiric antibiotics                                                                                        Once a day PPI therapy for now If nuclear medicine scan is negative consider GI consult for possible upper endoscopy to evaluate the antrum and proximal duodenum  Leighton Ruff. Redmond Pulling, MD,  FACS General, Bariatric, &  Minimally Invasive Surgery [facility], [place]

## 2019-03-19 NOTE — Progress Notes (Signed)
Discussed with EDP CT reviewed and labs reviewed  No GS on CT; has thick GB wall On ct from 1/31 duodenal wall thickening was present c/w inflammation or Peptic ulcer disease.   Duodenal wall stick looks a little thickened on CT from today as well so its possible GB could be reactively inflammed from duodenal process.   Rec HIDA scan to evaluate for cholecystitis Empiric abx Place on PPI If HIDA negative, consider duodenum source  Will order HIDA.   Leighton Ruff. Redmond Pulling, MD, FACS General, Bariatric, & Minimally Invasive Surgery [facility], [place]

## 2019-03-20 ENCOUNTER — Encounter (HOSPITAL_COMMUNITY): Payer: Self-pay | Admitting: Internal Medicine

## 2019-03-20 DIAGNOSIS — Z7951 Long term (current) use of inhaled steroids: Secondary | ICD-10-CM | POA: Diagnosis not present

## 2019-03-20 DIAGNOSIS — R1011 Right upper quadrant pain: Secondary | ICD-10-CM

## 2019-03-20 DIAGNOSIS — Z888 Allergy status to other drugs, medicaments and biological substances status: Secondary | ICD-10-CM | POA: Diagnosis not present

## 2019-03-20 DIAGNOSIS — E785 Hyperlipidemia, unspecified: Secondary | ICD-10-CM | POA: Diagnosis present

## 2019-03-20 DIAGNOSIS — K810 Acute cholecystitis: Secondary | ICD-10-CM | POA: Diagnosis present

## 2019-03-20 DIAGNOSIS — E875 Hyperkalemia: Secondary | ICD-10-CM | POA: Diagnosis present

## 2019-03-20 DIAGNOSIS — Z992 Dependence on renal dialysis: Secondary | ICD-10-CM

## 2019-03-20 DIAGNOSIS — I480 Paroxysmal atrial fibrillation: Secondary | ICD-10-CM

## 2019-03-20 DIAGNOSIS — M109 Gout, unspecified: Secondary | ICD-10-CM | POA: Diagnosis present

## 2019-03-20 DIAGNOSIS — Z79899 Other long term (current) drug therapy: Secondary | ICD-10-CM | POA: Diagnosis not present

## 2019-03-20 DIAGNOSIS — Z955 Presence of coronary angioplasty implant and graft: Secondary | ICD-10-CM | POA: Diagnosis not present

## 2019-03-20 DIAGNOSIS — D631 Anemia in chronic kidney disease: Secondary | ICD-10-CM

## 2019-03-20 DIAGNOSIS — Z885 Allergy status to narcotic agent status: Secondary | ICD-10-CM | POA: Diagnosis not present

## 2019-03-20 DIAGNOSIS — Z681 Body mass index (BMI) 19 or less, adult: Secondary | ICD-10-CM | POA: Diagnosis not present

## 2019-03-20 DIAGNOSIS — Z880 Allergy status to penicillin: Secondary | ICD-10-CM | POA: Diagnosis not present

## 2019-03-20 DIAGNOSIS — M1990 Unspecified osteoarthritis, unspecified site: Secondary | ICD-10-CM | POA: Diagnosis present

## 2019-03-20 DIAGNOSIS — Z7982 Long term (current) use of aspirin: Secondary | ICD-10-CM | POA: Diagnosis not present

## 2019-03-20 DIAGNOSIS — N186 End stage renal disease: Secondary | ICD-10-CM

## 2019-03-20 DIAGNOSIS — Z87891 Personal history of nicotine dependence: Secondary | ICD-10-CM | POA: Diagnosis not present

## 2019-03-20 DIAGNOSIS — K219 Gastro-esophageal reflux disease without esophagitis: Secondary | ICD-10-CM | POA: Diagnosis present

## 2019-03-20 DIAGNOSIS — I2510 Atherosclerotic heart disease of native coronary artery without angina pectoris: Secondary | ICD-10-CM

## 2019-03-20 DIAGNOSIS — R64 Cachexia: Secondary | ICD-10-CM | POA: Diagnosis present

## 2019-03-20 DIAGNOSIS — I10 Essential (primary) hypertension: Secondary | ICD-10-CM

## 2019-03-20 DIAGNOSIS — I120 Hypertensive chronic kidney disease with stage 5 chronic kidney disease or end stage renal disease: Secondary | ICD-10-CM | POA: Diagnosis present

## 2019-03-20 DIAGNOSIS — Z20822 Contact with and (suspected) exposure to covid-19: Secondary | ICD-10-CM | POA: Diagnosis present

## 2019-03-20 DIAGNOSIS — J449 Chronic obstructive pulmonary disease, unspecified: Secondary | ICD-10-CM | POA: Diagnosis present

## 2019-03-20 DIAGNOSIS — Z66 Do not resuscitate: Secondary | ICD-10-CM | POA: Diagnosis present

## 2019-03-20 DIAGNOSIS — N2581 Secondary hyperparathyroidism of renal origin: Secondary | ICD-10-CM | POA: Diagnosis present

## 2019-03-20 DIAGNOSIS — Z9115 Patient's noncompliance with renal dialysis: Secondary | ICD-10-CM | POA: Diagnosis not present

## 2019-03-20 DIAGNOSIS — R339 Retention of urine, unspecified: Secondary | ICD-10-CM | POA: Diagnosis present

## 2019-03-20 DIAGNOSIS — F319 Bipolar disorder, unspecified: Secondary | ICD-10-CM | POA: Diagnosis not present

## 2019-03-20 LAB — COMPREHENSIVE METABOLIC PANEL
ALT: 12 U/L (ref 0–44)
AST: 16 U/L (ref 15–41)
Albumin: 2.5 g/dL — ABNORMAL LOW (ref 3.5–5.0)
Alkaline Phosphatase: 65 U/L (ref 38–126)
Anion gap: 17 — ABNORMAL HIGH (ref 5–15)
BUN: 100 mg/dL — ABNORMAL HIGH (ref 8–23)
CO2: 19 mmol/L — ABNORMAL LOW (ref 22–32)
Calcium: 8.2 mg/dL — ABNORMAL LOW (ref 8.9–10.3)
Chloride: 97 mmol/L — ABNORMAL LOW (ref 98–111)
Creatinine, Ser: 7.3 mg/dL — ABNORMAL HIGH (ref 0.44–1.00)
GFR calc Af Amer: 6 mL/min — ABNORMAL LOW (ref >60)
GFR calc non Af Amer: 5 mL/min — ABNORMAL LOW (ref >60)
Glucose, Bld: 92 mg/dL (ref 70–99)
Potassium: 5.4 mmol/L — ABNORMAL HIGH (ref 3.5–5.1)
Sodium: 133 mmol/L — ABNORMAL LOW (ref 135–145)
Total Bilirubin: 0.7 mg/dL (ref 0.3–1.2)
Total Protein: 5 g/dL — ABNORMAL LOW (ref 6.5–8.1)

## 2019-03-20 LAB — CBC
HCT: 26.3 % — ABNORMAL LOW (ref 36.0–46.0)
Hemoglobin: 8.3 g/dL — ABNORMAL LOW (ref 12.0–15.0)
MCH: 30.4 pg (ref 26.0–34.0)
MCHC: 31.6 g/dL (ref 30.0–36.0)
MCV: 96.3 fL (ref 80.0–100.0)
Platelets: 310 10*3/uL (ref 150–400)
RBC: 2.73 MIL/uL — ABNORMAL LOW (ref 3.87–5.11)
RDW: 16.7 % — ABNORMAL HIGH (ref 11.5–15.5)
WBC: 10 10*3/uL (ref 4.0–10.5)
nRBC: 0 % (ref 0.0–0.2)

## 2019-03-20 MED ORDER — HEPARIN SODIUM (PORCINE) 1000 UNIT/ML IJ SOLN
INTRAMUSCULAR | Status: AC
  Filled 2019-03-20: qty 4

## 2019-03-20 MED ORDER — CHLORHEXIDINE GLUCONATE CLOTH 2 % EX PADS
6.0000 | MEDICATED_PAD | Freq: Every day | CUTANEOUS | Status: DC
  Administered 2019-03-20 – 2019-03-21 (×2): 6 via TOPICAL

## 2019-03-20 NOTE — Progress Notes (Signed)
Patient ID: Clent Jacks, female   DOB: 08-18-1950, 69 y.o.   MRN: [id number] S: Feels better after pain medications O:BP (!) 160/78 (BP Location: Right Arm)   Pulse (!) 58   Temp 97.9 F (36.6 C)   Resp 18   Ht 5\' 2"  (1.575 m)   Wt 45.4 kg   SpO2 100%   BMI 18.29 kg/m   Intake/Output Summary (Last 24 hours) at 03/20/2019 0957 Last data filed at 03/20/2019 0400 Gross per 24 hour  Intake 320 ml  Output --  Net 320 ml   Intake/Output: I/O last 3 completed shifts: In: 320 [P.O.:120; IV Piggyback:200] Out: -   Intake/Output this shift:  No intake/output data recorded. Weight change:  Gen: NAD CVS: no rub Resp: cta Abd: +BS, soft, + RUQ tenderness to palpation Ext: no edema, LUE AVF +T/B  Recent Labs  Lab 03/18/19 0141 03/18/19 0224 03/19/19 1400 03/20/19 0300  NA 131* 130* 134* 133*  K 4.7 4.8 5.3* 5.4*  CL 92* 95* 94* 97*  CO2 21*  --  19* 19*  GLUCOSE 145* 139* 63* 92  BUN 68* 65* 89* 100*  CREATININE 6.65* 7.50* 7.17* 7.30*  ALBUMIN 3.1*  --  3.3* 2.5*  CALCIUM 7.7*  --  8.5* 8.2*  AST 19  --  24 16  ALT 16  --  17 12   Liver Function Tests: Recent Labs  Lab 03/18/19 0141 03/19/19 1400 03/20/19 0300  AST 19 24 16   ALT 16 17 12   ALKPHOS 65 85 65  BILITOT 0.6 0.6 0.7  PROT 5.9* 6.5 5.0*  ALBUMIN 3.1* 3.3* 2.5*   Recent Labs  Lab 03/18/19 0141 03/19/19 1400  LIPASE 41 49   No results for input(s): AMMONIA in the last 168 hours. CBC: Recent Labs  Lab 03/18/19 0141 03/18/19 0141 03/18/19 0224 03/19/19 1400 03/20/19 0300  WBC 10.1  --   --  13.0* 10.0  NEUTROABS 9.0*  --   --   --   --   HGB 8.1*   < > 9.2* 9.6* 8.3*  HCT 25.6*   < > 27.0* 32.2* 26.3*  MCV 96.6  --   --  101.9* 96.3  PLT 330  --   --  330 310   < > = values in this interval not displayed.   Cardiac Enzymes: No results for input(s): CKTOTAL, CKMB, CKMBINDEX, TROPONINI in the last 168 hours. CBG: No results for input(s): GLUCAP in the last 168 hours.  Iron Studies:  No results for input(s): IRON, TIBC, TRANSFERRIN, FERRITIN in the last 72 hours. Studies/Results: DG Chest Port 1 View  Result Date: 03/19/2019 CLINICAL DATA:  Chest pain since last night with shortness of breath. Missed dialysis today and this past Tuesday. EXAM: PORTABLE CHEST 1 VIEW COMPARISON:  03/09/2019 FINDINGS: Right IJ dialysis catheter unchanged. Lungs are hyperexpanded without lobar consolidation or effusion. Subtle hazy prominence of the central pulmonary vessels likely minimal vascular congestion. Cardiomediastinal silhouette and remainder of the exam is unchanged. IMPRESSION: Suggestion of minimal vascular congestion. Electronically Signed   By: Marin Olp M.D.   On: 03/19/2019 13:41   CT Angio Chest/Abd/Pel for Dissection W and/or Wo Contrast  Result Date: 03/19/2019 CLINICAL DATA:  69 year old female with history of chest and back pain. Suspected aortic dissection. EXAM: CT ANGIOGRAPHY CHEST, ABDOMEN AND PELVIS TECHNIQUE: Multidetector CT imaging through the chest, abdomen and pelvis was performed using the standard protocol during bolus administration of intravenous contrast. Multiplanar reconstructed images and  MIPs were obtained and reviewed to evaluate the vascular anatomy. CONTRAST:  28mL OMNIPAQUE IOHEXOL 350 MG/ML SOLN COMPARISON:  CT the abdomen and pelvis 03/09/2019. CT the abdomen and pelvis 02/27/2019. Chest CT 04/05/2018. FINDINGS: CTA CHEST FINDINGS Cardiovascular: Noncontrast images demonstrate no crescentic high attenuation associated with the thoracic aorta to suggest acute intramural hematoma. Postcontrast images demonstrate no definite aortic dissection. There is extensive irregular atheromatous plaque throughout the thoracic aorta with multiple areas of ulceration, but no frank dissection or penetration. Heart size is normal. There is no significant pericardial fluid, thickening or pericardial calcification. There is aortic atherosclerosis, as well as atherosclerosis of  the great vessels of the mediastinum and the coronary arteries, including calcified atherosclerotic plaque in the left main, left anterior descending, left circumflex and right coronary arteries. Calcifications of the mitral annulus. Right internal jugular PermCath with tip terminating in the right atrium. Mediastinum/Nodes: No pathologically enlarged mediastinal or hilar lymph nodes. Esophagus is unremarkable in appearance. No axillary lymphadenopathy. Lungs/Pleura: A few scattered tiny 2-3 mm pulmonary nodules are noted throughout the lungs bilaterally. No other larger more suspicious appearing pulmonary nodules or masses are noted. Mild diffuse bronchial wall thickening with mild centrilobular and paraseptal emphysema. Linear scarring or subsegmental atelectasis in the dependent portion of the left lower lobe. Musculoskeletal: There are no aggressive appearing lytic or blastic lesions noted in the visualized portions of the skeleton. Review of the MIP images confirms the above findings. CTA ABDOMEN AND PELVIS FINDINGS VASCULAR Aorta: Normal caliber aorta without aneurysm, dissection, vasculitis or significant stenosis. However, there is extensive atheromatous plaque, much of which is highly irregular with several areas of ulcerative plaque throughout the abdominal aorta. Celiac: Ostium of the vessel is not confidently identified and is either severely stenotic or completely occluded. However, distal aspects of the vessel and its major branches are patent, potentially via collateral flow. SMA: Patent with mild stenosis proximally (2 mm) followed by poststenotic dilatation (7 mm). Renals: Both renal arteries are patent without evidence of aneurysm, dissection, vasculitis, fibromuscular dysplasia or significant stenosis. IMA: Ostium of the vessel is not confidently identified, potentially completely occluded. Distal aspect of the vessel is patent, presumably fed by collateral pathways. Inflow: Patent without  evidence of aneurysm, dissection, vasculitis or significant stenosis. Veins: No obvious venous abnormality within the limitations of this arterial phase study. Review of the MIP images confirms the above findings. NON-VASCULAR Hepatobiliary: No suspicious cystic or solid hepatic lesions. No intra or extrahepatic biliary ductal dilatation. Gallbladder wall appears thickened and edematous (up to 1 cm in thickness). Gallbladder is only mildly distended. No calcified gallstones identified. Pancreas: No pancreatic mass. No pancreatic ductal dilatation. No pancreatic or peripancreatic fluid collections or inflammatory changes. Spleen: Unremarkable. Adrenals/Urinary Tract: 2.8 x 2.4 cm low-attenuation (-2 HU) left adrenal nodule (axial image 155 of series 7), compatible with an adenoma. 2.5 x 1.9 cm right adrenal nodule which demonstrates avid enhancement (axial image 151 of series 7). Multiple calcifications associated with the renal hila bilaterally, favored to be vascular, although nonobstructive calculi are not excluded. Subcentimeter low-attenuation lesions in both kidneys, too small to characterize, but statistically likely to represent cysts. 1.5 cm low-attenuation lesion in the upper pole the left kidney, compatible with a simple cyst. No suspicious renal lesions. Mild bilateral renal atrophy. No hydroureteronephrosis. Urinary bladder is nearly completely decompressed around an indwelling Foley balloon catheter. Stomach/Bowel: Normal appearance of the stomach. No pathologic dilatation of small bowel or colon. The appendix is not confidently identified and may be surgically  absent. Regardless, there are no inflammatory changes noted adjacent to the cecum to suggest the presence of an acute appendicitis at this time. Lymphatic: No lymphadenopathy noted in the abdomen or pelvis. Reproductive: Status post hysterectomy. Ovaries are not confidently identified may be surgically absent or atrophic. Other: No significant  volume of ascites.  No pneumoperitoneum. Musculoskeletal: There are no aggressive appearing lytic or blastic lesions noted in the visualized portions of the skeleton. Review of the MIP images confirms the above findings. IMPRESSION: 1. 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 2. Gallbladder wall thickening and edema. No calcified gallstones. Gallbladder is only mildly distended. Overall, findings are equivocal for acute cholecystitis, but further clinical evaluation is recommended. If there is clinical concern for acute cholecystitis, further evaluation with right upper quadrant ultrasound would be recommended. 3. 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 4. Bilateral adrenal nodules, similar to prior studies. The left adrenal nodule is compatible with a benign adenoma. The avidly enhancing right adrenal nodule is incompletely characterized, but appears relatively stable in size compared to numerous prior examinations, suggesting a benign lesion. 5. There is also left main and 3 vessel coronary artery disease. Please note that although the presence of coronary artery calcium documents the presence of coronary artery disease, the severity of this disease and any potential stenosis cannot be assessed on this non-gated CT examination. Assessment for potential risk factor modification, dietary therapy or pharmacologic therapy may be warranted, if clinically indicated. 6. Scattered tiny 2-3 mm pulmonary nodules noted in the lungs bilaterally, nonspecific, but statistically likely benign. No follow-up needed if patient is low-risk (and has no known or suspected primary neoplasm). Non-contrast chest CT can be considered in 12 months if patient is high-risk. This  recommendation follows the consensus statement: Guidelines for Management of Incidental Pulmonary Nodules Detected on CT Images: From the Fleischner Society 2017; Radiology 2017; 284:228-243. 7. Additional incidental findings, as above. Electronically Signed   By: Vinnie Langton M.D.   On: 03/19/2019 15:55   US Abdomen Limited RUQ  Result Date: 03/19/2019 CLINICAL DATA:  Right upper quadrant pain EXAM: ULTRASOUND ABDOMEN LIMITED RIGHT UPPER QUADRANT COMPARISON:  None. FINDINGS: Gallbladder: No gallstones. Positive sonographic Murphy sign noted by sonographer. Diffuse gallbladder wall thickening with small amount of pericholecystic fluid. Common bile duct: Diameter: 3 mm Liver: No focal lesion identified. Within normal limits in parenchymal echogenicity. Portal vein is patent on color Doppler imaging with normal direction of blood flow towards the liver. Other: None. IMPRESSION: Positive sonographic Murphy sign with gallbladder wall thickening and pericholecystic fluid, but no cholelithiasis. Electronically Signed   By: Ulyses Jarred M.D.   On: 03/19/2019 18:53   . aspirin EC  81 mg Oral Daily  . [START ON 03/22/2019] calcitRIOL  1 mcg Oral Q T,Th,Sa-HD  . Chlorhexidine Gluconate Cloth  6 each Topical Daily  . dicyclomine  20 mg Oral QHS  . isosorbide mononitrate  30 mg Oral Daily  . metoprolol tartrate  100 mg Oral BID  . mometasone-formoterol  2 puff Inhalation BID  . pantoprazole  40 mg Oral Daily  . pravastatin  80 mg Oral QPM  . sodium zirconium cyclosilicate  10 g Oral Daily    BMET    Component Value Date/Time   NA 133 (L) 03/20/2019 0300   K  5.4 (H) 03/20/2019 0300   CL 97 (L) 03/20/2019 0300   CO2 19 (L) 03/20/2019 0300   GLUCOSE 92 03/20/2019 0300   BUN 100 (H) 03/20/2019 0300   CREATININE 7.30 (H) 03/20/2019 0300   CALCIUM 8.2 (L) 03/20/2019 0300   GFRNONAA 5 (L) 03/20/2019 0300   GFRAA 6 (L) 03/20/2019 0300   CBC    Component Value Date/Time   WBC 10.0 03/20/2019 0300    RBC 2.73 (L) 03/20/2019 0300   HGB 8.3 (L) 03/20/2019 0300   HCT 26.3 (L) 03/20/2019 0300   PLT 310 03/20/2019 0300   MCV 96.3 03/20/2019 0300   MCH 30.4 03/20/2019 0300   MCHC 31.6 03/20/2019 0300   RDW 16.7 (H) 03/20/2019 0300   LYMPHSABS 0.6 (L) 03/18/2019 0141   MONOABS 0.4 03/18/2019 0141   EOSABS 0.0 03/18/2019 0141   BASOSABS 0.0 03/18/2019 0141    Dialysis Access:LU AVF+b with mild  non tender ecchymosis of upper arm, R IJ TDC    Dialysis Orders: TTS -[facility] 4hrs,EDWQ 45.6 kg,2K/2Ca Access:LUAVF,  IJ  TDC Heparinnone Mircera26mcg q2wks - last 03/15/19 Venofer 50mg  qHD Calcitriol0.60mcg PO qHD    Assessment/Plan 1. Hyperkalemia - missed HD  Hd and given lokelma.  Plan for HD today. 2. ESRD -  HD TTS schedule, use perm cath today, but attempt AVF later in admit (ho infiltrate), has Foley cath need to dc if able.  Unable to perform HD last night due to delay in covid testing.  Plan for HD today but will need to schedule around her HIDA scan scheduled today around 1-1:30  3.  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  [facility] following and awaiting HIDA scan today.  Antibiotics per [facility] 4. Chest pain ho CAD sp stents = first trop Neg , but CT angio shows left main and 3 vessel CAD.  Continue to follow and is chest pain free today. 5. Hypertension/volume  - no excess volume on exam , mild vas congest on cxr  uf 2l as tolerated on HD  6. Anemia  - hgb 9,3 just received  ESA 2/16 wkly fe  On hd , 7. Metabolic bone disease -  Binders when eating meals , po vit d on hd start next week  8. Nutrition - alb 3.1  Protein supplement when eating  9. HO AFIB - SR on exam  On BB , Not AC canidate 22/2 gi bld  10. Nonadherence to dialysis- continue to address barriers to attendance 11. HO Substance abuse/ ho bipolar  Donetta Potts, MD [facility] [phone]

## 2019-03-20 NOTE — Progress Notes (Signed)
03/20/2019 Patient foley cath was removed at 10:30. Patient had 500 cc of clear yellow urine. Rico Sheehan RN

## 2019-03-20 NOTE — Progress Notes (Signed)
PROGRESS NOTE    Christina [name]  FOY:[id number] DOB: March 26, 1950 DOA: 03/19/2019 PCP: Maggie Schwalbe, PA-C   Brief Narrative: HPI per Dr. Jana Half is an 69 y.o. female with PMH significant for ESRD on HD with history of multiple missed dialyses, HTN, A. fib, CAD, COPD, history of SBO and depression who presents with abdominal and chest pain.  Patient states that she has had intermittent chest pain over the past 3 to 4 days which she describes as a "mashing kind of pain" over her central chest and upper abdominal area.  These episodes occur at rest.  They come and go with no known provocation.  No associated shortness of breath.  Patient does admit to nausea and vomiting which she has had for the last couple of days.  Patient also complaining of epigastric abdominal pain which she states comes in waves.  There is associated nausea.  She has vomited twice since yesterday.  While I was talking to her patient developed what she describes as chest pain which seem to be spasmodic versus colicky.  When I asked her to describe where it was she pointed to her upper abdominal area.  Patient admits that she missed her last dialysis.  She did come into dialysis today but because of her chest and abdominal pain she was sent to the ED.  Of note patient has had multiple similar admissions with abdominal pain after having missed dialysis.  And in the past abdominal pain resolves with dialysis.  Patient denies fevers or chills.  She does admit to vomitus x2.  Not bloody.  No malaise.  She has been passing gas.  Denies constipation.  Patient admits she is not very active so is not sure whether the chest pain that she is having is associated with exertion or not.  ED Course:  The patient was noted to be hypertensive with no oxygen requirement.  She underwent a CTA chest, abdomen and pelvis which showed multiple abnormalities mostly chronic.  Was noted on CT report that patient has  had 4 abdominal CT scans in the past 4 weeks, both here and at [facility]. Notable on the CT though was some gallbladder thickening with edema but only mild distention, no calcified gallstones, equivocal for acute cholecystitis.   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   She is also noted to have three-vessel coronary artery disease and left main disease.   Nephrology consultation was called and they are planning on dialyzing her today.  General surgery consultation was called and they have yet to see her.  She was started on Zosyn for possible acute cholecystitis.   Assessment & Plan:   Principal Problem:   Abdominal pain Active Problems:   ESRD (end stage renal disease) (HCC)   Bipolar affective (HCC)   CAD (coronary artery disease), native coronary artery   COPD with chronic bronchitis (HCC)   Afib (HCC)   Non-compliance with renal dialysis ([place])   Chest pain   RUQ pain   Anemia due to chronic kidney disease, on chronic dialysis (HCC)   AF (paroxysmal atrial fibrillation) ([place])  1 epigastric/right upper quadrant abdominal pain Patient presented with epigastric and right upper quadrant abdominal pain associated with meal intake with nausea and vomiting.  Patient did describe some chest pain however in further discussion seems like pain more epigastric in nature and radiating upwards and downwards with also a burning sensation to it.  EKG normal.  Initial troponin not elevated.  Right upper quadrant ultrasound done consistent with acute cholecystitis.  Graham chest abdomen and pelvis with extensive severe atherosclerosis throughout thoracoabdominal aorta with no evidence to suggest acute aortic syndrome, goal bladder wall thickening and edema, no calcified stones, mildly distended gallbladder, overall findings equivocal for acute cholecystitis but further clinical evaluation recommended.  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  Left main and three-vessel coronary artery disease noted.  Patient seen in consultation by general surgery who are recommending HIDA scan for further evaluation.  Patient noted on admission to have a leukocytosis.  Continue empiric IV Rocephin and IV Flagyl.  Continue PPI.  Supportive care.  General surgery following and appreciate input and recommendations.  2.  End-stage renal disease with medical noncompliance Patient noted that last hemodialysis was 5 days prior to admission.  Patient stated missed other hemodialysis sessions due to significant pain and inability to get out of her home.  Patient seen in consultation by nephrology and patient for hemodialysis today.  Nephrology following and appreciate input and recommendations.  3.  Hypertension Continue Lopressor and Imdur.  4.  History of paroxysmal atrial fibrillation Currently normal sinus rhythm.  Continue Lopressor for rate control.  Patient not on anticoagulation as an outpatient likely secondary to history of noncompliance.  Follow.  5.  Anemia Patient with no overt bleeding.  Likely anemia of chronic disease.  Check an anemia panel.  Follow H&H.  6.  History of coronary artery disease Patient presenting with epigastric and right upper quadrant pain radiating to the chest which she also described as a chest pain.  Pain likely more GI related/secondary to probable acute cholecystitis.  CT angiogram chest abdomen and pelvis did show left main disease and coronary artery disease however patient's presentation more secondary to probable acute cholecystitis.  EKG in normal sinus rhythm.  Troponins slightly elevated but flattened.  Patient with recent 2D echo 01/25/2019 with a EF of 45 to 69%, grade 1 diastolic dysfunction, global hypokinesis of the left ventricle.  We will continue cardiac medications of  Lopressor, Imdur, Pravachol, aspirin.  Outpatient follow-up with cardiology.   DVT prophylaxis: SCDs Code Status: DNR Family Communication: Updated patient.  No family at bedside. Disposition Plan:  . Patient came from: Home            . Anticipated d/c place: Home. . Barriers to d/c OR conditions which need to be met to effect a safe d/c: Home when clinically improved, cleared by consultants with resolution of abdominal and right upper quadrant pain.   Consultants:   General surgery: Dr. Redmond Pulling 03/19/2019  Nephrology: Dr. Marval Regal 03/19/2019  Procedures:   Right upper quadrant ultrasound 03/19/2019  CT angiogram chest abdomen and pelvis 03/19/2019  HIDA scan pending  Antimicrobials:   IV Rocephin 03/19/2019  IV Flagyl 03/19/2019   Subjective: Patient laying in bed.  Denies any nausea or emesis this morning however complained of significant abdominal pain after she drank her coffee and after eating pineapple this morning in the epigastric and right upper quadrant.  Also complaining of some midsternal burning pain with epigastric pain.  Objective: Vitals:   03/19/19 1628 03/19/19 1645 03/19/19 2043 03/20/19 0921  BP:  (!) 114/95 (!) 150/92 (!) 160/78  Pulse:   67 (!) 58  Resp:  '18 20 18  '$ Temp: 98.4 F (36.9 C)  99 F (37.2 C)  97.9 F (36.6 C)  TempSrc: Oral  Oral   SpO2:   98% 100%  Weight:      Height:        Intake/Output Summary (Last 24 hours) at 03/20/2019 0955 Last data filed at 03/20/2019 0400 Gross per 24 hour  Intake 320 ml  Output --  Net 320 ml   Filed Weights   03/19/19 1343  Weight: 45.4 kg    Examination:  General exam: Appears calm and comfortable.  Missing teeth. Respiratory system: Clear to auscultation. Respiratory effort normal. Cardiovascular system: Regular rate rhythm with 3/6 SEM. No JVD, murmurs, rubs, gallops or clicks. No pedal edema. Gastrointestinal system: Abdomen is soft, tender to palpation in the epigastrium and right upper  quadrant.  Positive bowel sounds.  No rebound.  No guarding.  Central nervous system: Alert and oriented. No focal neurological deficits. Extremities: Symmetric 5 x 5 power. Skin: No rashes, lesions or ulcers Psychiatry: Judgement and insight appear normal. Mood & affect appropriate.     Data Reviewed: I have personally reviewed following labs and imaging studies  CBC: Recent Labs  Lab 03/18/19 0141 03/18/19 0224 03/19/19 1400 03/20/19 0300  WBC 10.1  --  13.0* 10.0  NEUTROABS 9.0*  --   --   --   HGB 8.1* 9.2* 9.6* 8.3*  HCT 25.6* 27.0* 32.2* 26.3*  MCV 96.6  --  101.9* 96.3  PLT 330  --  330 354   Basic Metabolic Panel: Recent Labs  Lab 03/18/19 0141 03/18/19 0224 03/19/19 1400 03/20/19 0300  NA 131* 130* 134* 133*  K 4.7 4.8 5.3* 5.4*  CL 92* 95* 94* 97*  CO2 21*  --  19* 19*  GLUCOSE 145* 139* 63* 92  BUN 68* 65* 89* 100*  CREATININE 6.65* 7.50* 7.17* 7.30*  CALCIUM 7.7*  --  8.5* 8.2*   GFR: Estimated Creatinine Clearance: 5.3 mL/min (A) (by C-G formula based on SCr of 7.3 mg/dL (H)). Liver Function Tests: Recent Labs  Lab 03/18/19 0141 03/19/19 1400 03/20/19 0300  AST '19 24 16  '$ ALT '16 17 12  '$ ALKPHOS 65 85 65  BILITOT 0.6 0.6 0.7  PROT 5.9* 6.5 5.0*  ALBUMIN 3.1* 3.3* 2.5*   Recent Labs  Lab 03/18/19 0141 03/19/19 1400  LIPASE 41 49   No results for input(s): AMMONIA in the last 168 hours. Coagulation Profile: No results for input(s): INR, PROTIME in the last 168 hours. Cardiac Enzymes: No results for input(s): CKTOTAL, CKMB, CKMBINDEX, TROPONINI in the last 168 hours. BNP (last 3 results) No results for input(s): PROBNP in the last 8760 hours. HbA1C: No results for input(s): HGBA1C in the last 72 hours. CBG: No results for input(s): GLUCAP in the last 168 hours. Lipid Profile: No results for input(s): CHOL, HDL, LDLCALC, TRIG, CHOLHDL, LDLDIRECT in the last 72 hours. Thyroid Function Tests: No results for input(s): TSH, T4TOTAL, FREET4,  T3FREE, THYROIDAB in the last 72 hours. Anemia Panel: No results for input(s): VITAMINB12, FOLATE, FERRITIN, TIBC, IRON, RETICCTPCT in the last 72 hours. Sepsis Labs: Recent Labs  Lab 03/18/19 0353  LATICACIDVEN 1.7    Recent Results (from the past 240 hour(s))  Respiratory Panel by RT PCR (Flu A&B, Covid) - Nasopharyngeal Swab     Status: None   Collection Time: 03/19/19  7:40 PM   Specimen: Nasopharyngeal Swab  Result Value Ref Range Status   SARS Coronavirus 2 by RT PCR NEGATIVE NEGATIVE Final    Comment: (NOTE) SARS-CoV-2 target nucleic acids are  NOT DETECTED. The SARS-CoV-2 RNA is generally detectable in upper respiratoy specimens during the acute phase of infection. The lowest concentration of SARS-CoV-2 viral copies this assay can detect is 131 copies/mL. A negative result does not preclude SARS-Cov-2 infection and should not be used as the sole basis for treatment or other patient management decisions. A negative result may occur with  improper specimen collection/handling, submission of specimen other than nasopharyngeal swab, presence of viral mutation(s) within the areas targeted by this assay, and inadequate number of viral copies (<131 copies/mL). A negative result must be combined with clinical observations, patient history, and epidemiological information. The expected result is Negative. Fact Sheet for Patients:  PinkCheek.be Fact Sheet for Healthcare Providers:  GravelBags.it This test is not yet ap proved or cleared by the [place] FDA and  has been authorized for detection and/or diagnosis of SARS-CoV-2 by FDA under an Emergency Use Authorization (EUA). This EUA will remain  in effect (meaning this test can be used) for the duration of the COVID-19 declaration under Section 564(b)(1) of the Act, 21 U.S.C. section 360bbb-3(b)(1), unless the authorization is terminated or revoked sooner.     Influenza A by PCR NEGATIVE NEGATIVE Final   Influenza B by PCR NEGATIVE NEGATIVE Final    Comment: (NOTE) The Xpert Xpress SARS-CoV-2/FLU/RSV assay is intended as an aid in  the diagnosis of influenza from Nasopharyngeal swab specimens and  should not be used as a sole basis for treatment. Nasal washings and  aspirates are unacceptable for Xpert Xpress SARS-CoV-2/FLU/RSV  testing. Fact Sheet for Patients: PinkCheek.be Fact Sheet for Healthcare Providers: GravelBags.it This test is not yet approved or cleared by the [place] FDA and  has been authorized for detection and/or diagnosis of SARS-CoV-2 by  FDA under an Emergency Use Authorization (EUA). This EUA will remain  in effect (meaning this test can be used) for the duration of the  Covid-19 declaration under Section 564(b)(1) of the Act, 21  U.S.C. section 360bbb-3(b)(1), unless the authorization is  terminated or revoked. Performed at [facility], [place] [address] [place], [place] [address]          Radiology Studies: DG Chest Port 1 View  Result Date: 03/19/2019 CLINICAL DATA:  Chest pain since last night with shortness of breath. Missed dialysis today and this past Tuesday. EXAM: PORTABLE CHEST 1 VIEW COMPARISON:  03/09/2019 FINDINGS: Right IJ dialysis catheter unchanged. Lungs are hyperexpanded without lobar consolidation or effusion. Subtle hazy prominence of the central pulmonary vessels likely minimal vascular congestion. Cardiomediastinal silhouette and remainder of the exam is unchanged. IMPRESSION: Suggestion of minimal vascular congestion. Electronically Signed   By: Marin Olp M.D.   On: 03/19/2019 13:41   CT Angio Chest/Abd/Pel for Dissection W and/or Wo Contrast  Result Date: 03/19/2019 CLINICAL DATA:  69 year old female with history of chest and back pain. Suspected aortic dissection. EXAM: CT ANGIOGRAPHY CHEST, ABDOMEN AND PELVIS TECHNIQUE:  Multidetector CT imaging through the chest, abdomen and pelvis was performed using the standard protocol during bolus administration of intravenous contrast. Multiplanar reconstructed images and MIPs were obtained and reviewed to evaluate the vascular anatomy. CONTRAST:  46m OMNIPAQUE IOHEXOL 350 MG/ML SOLN COMPARISON:  CT the abdomen and pelvis 03/09/2019. CT the abdomen and pelvis 02/27/2019. Chest CT 04/05/2018. FINDINGS: CTA CHEST FINDINGS Cardiovascular: Noncontrast images demonstrate no crescentic high attenuation associated with the thoracic aorta to suggest acute intramural hematoma. Postcontrast images demonstrate no definite aortic dissection. There is extensive irregular atheromatous plaque throughout the  thoracic aorta with multiple areas of ulceration, but no frank dissection or penetration. Heart size is normal. There is no significant pericardial fluid, thickening or pericardial calcification. There is aortic atherosclerosis, as well as atherosclerosis of the great vessels of the mediastinum and the coronary arteries, including calcified atherosclerotic plaque in the left main, left anterior descending, left circumflex and right coronary arteries. Calcifications of the mitral annulus. Right internal jugular PermCath with tip terminating in the right atrium. Mediastinum/Nodes: No pathologically enlarged mediastinal or hilar lymph nodes. Esophagus is unremarkable in appearance. No axillary lymphadenopathy. Lungs/Pleura: A few scattered tiny 2-3 mm pulmonary nodules are noted throughout the lungs bilaterally. No other larger more suspicious appearing pulmonary nodules or masses are noted. Mild diffuse bronchial wall thickening with mild centrilobular and paraseptal emphysema. Linear scarring or subsegmental atelectasis in the dependent portion of the left lower lobe. Musculoskeletal: There are no aggressive appearing lytic or blastic lesions noted in the visualized portions of the skeleton. Review of  the MIP images confirms the above findings. CTA ABDOMEN AND PELVIS FINDINGS VASCULAR Aorta: Normal caliber aorta without aneurysm, dissection, vasculitis or significant stenosis. However, there is extensive atheromatous plaque, much of which is highly irregular with several areas of ulcerative plaque throughout the abdominal aorta. Celiac: Ostium of the vessel is not confidently identified and is either severely stenotic or completely occluded. However, distal aspects of the vessel and its major branches are patent, potentially via collateral flow. SMA: Patent with mild stenosis proximally (2 mm) followed by poststenotic dilatation (7 mm). Renals: Both renal arteries are patent without evidence of aneurysm, dissection, vasculitis, fibromuscular dysplasia or significant stenosis. IMA: Ostium of the vessel is not confidently identified, potentially completely occluded. Distal aspect of the vessel is patent, presumably fed by collateral pathways. Inflow: Patent without evidence of aneurysm, dissection, vasculitis or significant stenosis. Veins: No obvious venous abnormality within the limitations of this arterial phase study. Review of the MIP images confirms the above findings. NON-VASCULAR Hepatobiliary: No suspicious cystic or solid hepatic lesions. No intra or extrahepatic biliary ductal dilatation. Gallbladder wall appears thickened and edematous (up to 1 cm in thickness). Gallbladder is only mildly distended. No calcified gallstones identified. Pancreas: No pancreatic mass. No pancreatic ductal dilatation. No pancreatic or peripancreatic fluid collections or inflammatory changes. Spleen: Unremarkable. Adrenals/Urinary Tract: 2.8 x 2.4 cm low-attenuation (-2 HU) left adrenal nodule (axial image 155 of series 7), compatible with an adenoma. 2.5 x 1.9 cm right adrenal nodule which demonstrates avid enhancement (axial image 151 of series 7). Multiple calcifications associated with the renal hila bilaterally, favored  to be vascular, although nonobstructive calculi are not excluded. Subcentimeter low-attenuation lesions in both kidneys, too small to characterize, but statistically likely to represent cysts. 1.5 cm low-attenuation lesion in the upper pole the left kidney, compatible with a simple cyst. No suspicious renal lesions. Mild bilateral renal atrophy. No hydroureteronephrosis. Urinary bladder is nearly completely decompressed around an indwelling Foley balloon catheter. Stomach/Bowel: Normal appearance of the stomach. No pathologic dilatation of small bowel or colon. The appendix is not confidently identified and may be surgically absent. Regardless, there are no inflammatory changes noted adjacent to the cecum to suggest the presence of an acute appendicitis at this time. Lymphatic: No lymphadenopathy noted in the abdomen or pelvis. Reproductive: Status post hysterectomy. Ovaries are not confidently identified may be surgically absent or atrophic. Other: No significant volume of ascites.  No pneumoperitoneum. Musculoskeletal: There are no aggressive appearing lytic or blastic lesions noted in the visualized portions of  the skeleton. Review of the MIP images confirms the above findings. IMPRESSION: 1. 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 2. Gallbladder wall thickening and edema. No calcified gallstones. Gallbladder is only mildly distended. Overall, findings are equivocal for acute cholecystitis, but further clinical evaluation is recommended. If there is clinical concern for acute cholecystitis, further evaluation with right upper quadrant ultrasound would be recommended. 3. 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 4. Bilateral adrenal nodules, similar to prior  studies. The left adrenal nodule is compatible with a benign adenoma. The avidly enhancing right adrenal nodule is incompletely characterized, but appears relatively stable in size compared to numerous prior examinations, suggesting a benign lesion. 5. There is also left main and 3 vessel coronary artery disease. Please note that although the presence of coronary artery calcium documents the presence of coronary artery disease, the severity of this disease and any potential stenosis cannot be assessed on this non-gated CT examination. Assessment for potential risk factor modification, dietary therapy or pharmacologic therapy may be warranted, if clinically indicated. 6. Scattered tiny 2-3 mm pulmonary nodules noted in the lungs bilaterally, nonspecific, but statistically likely benign. No follow-up needed if patient is low-risk (and has no known or suspected primary neoplasm). Non-contrast chest CT can be considered in 12 months if patient is high-risk. This recommendation follows the consensus statement: Guidelines for Management of Incidental Pulmonary Nodules Detected on CT Images: From the Fleischner Society 2017; Radiology 2017; 284:[id number]. 7. Additional incidental findings, as above. Electronically Signed   By: Vinnie Langton M.D.   On: 03/19/2019 15:55   US Abdomen Limited RUQ  Result Date: 03/19/2019 CLINICAL DATA:  Right upper quadrant pain EXAM: ULTRASOUND ABDOMEN LIMITED RIGHT UPPER QUADRANT COMPARISON:  None. FINDINGS: Gallbladder: No gallstones. Positive sonographic Murphy sign noted by sonographer. Diffuse gallbladder wall thickening with small amount of pericholecystic fluid. Common bile duct: Diameter: 3 mm Liver: No focal lesion identified. Within normal limits in parenchymal echogenicity. Portal vein is patent on color Doppler imaging with normal direction of blood flow towards the liver. Other: None. IMPRESSION: Positive sonographic Murphy sign with gallbladder wall thickening and  pericholecystic fluid, but no cholelithiasis. Electronically Signed   By: Ulyses Jarred M.D.   On: 03/19/2019 18:53        Scheduled Meds: . aspirin EC  81 mg Oral Daily  . [START ON 03/22/2019] calcitRIOL  1 mcg Oral Q T,Th,Sa-HD  . Chlorhexidine Gluconate Cloth  6 each Topical Daily  . dicyclomine  20 mg Oral QHS  . isosorbide mononitrate  30 mg Oral Daily  . metoprolol tartrate  100 mg Oral BID  . mometasone-formoterol  2 puff Inhalation BID  . pantoprazole  40 mg Oral Daily  . pravastatin  80 mg Oral QPM  . sodium zirconium cyclosilicate  10 g Oral Daily   Continuous Infusions: . cefTRIAXone (ROCEPHIN)  IV Stopped (03/19/19 2123)  . metronidazole 500 mg (03/20/19 0511)     LOS: 0 days    Time spent: 35 minutes    Irine Seal, MD [facility]   To contact the attending provider between 7A-7P or the covering provider during after hours 7P-7A, please log into the web site [email or web address] and access using universal  password for that web site. If you do not have the password, please call  the hospital operator.  03/20/2019, 9:55 AM

## 2019-03-20 NOTE — Progress Notes (Signed)
[facility] Progress Note     Subjective: CC: abdominal pain Patient reports that she had severe abdominal pain this AM, radiating to chest and back. Reports nausea and emesis. +flatus, no BM in a few days. Discussed plans for HIDA scan today and patient in agreement. She did eat this AM but will not eat until after test.   Review of Systems  Constitutional: Negative for chills and fever.  Respiratory: Negative for shortness of breath.   Cardiovascular: Negative for chest pain and palpitations.  Gastrointestinal: Positive for abdominal pain, constipation, nausea and vomiting.  Genitourinary: Negative for dysuria, frequency and urgency.     Objective: Vital signs in last 24 hours: Temp:  [98.4 F (36.9 C)-99 F (37.2 C)] 99 F (37.2 C) (02/20 2043) Pulse Rate:  [62-67] 67 (02/20 2043) Resp:  [12-23] 20 (02/20 2043) BP: (114-180)/(65-99) 150/92 (02/20 2043) SpO2:  [94 %-100 %] 98 % (02/20 2043) Weight:  [45.4 kg] 45.4 kg (02/20 1343)    Intake/Output from previous day: 02/20 0701 - 02/21 0700 In: 320 [P.O.:120; IV Piggyback:200] Out: -  Intake/Output this shift: No intake/output data recorded.  PE: General: pleasant, WD, cachectic white female who is laying in bed in NAD HEENT:   Sclera are anicteric.  PERRL.  Ears and nose without any masses or lesions.  Mouth is pink and moist Heart: regular, rate, and rhythm.  Normal s1,s2. No obvious murmurs, gallops, or rubs noted.  Palpable radial and pedal pulses bilaterally Lungs: CTAB, no wheezes, rhonchi, or rales noted.  Respiratory effort nonlabored Abd: soft, NT, ND, +BS, no masses, hernias, or organomegaly, midline surgical scar  MS: all 4 extremities are symmetrical with no cyanosis, clubbing, or edema. Skin: warm and dry with no masses, lesions, or rashes Neuro: Cranial nerves 2-12 grossly intact, speech is normal Psych: A&Ox3 with an appropriate affect.   Lab Results:  Recent Labs    03/19/19 1400  03/20/19 0300  WBC 13.0* 10.0  HGB 9.6* 8.3*  HCT 32.2* 26.3*  PLT 330 310   BMET Recent Labs    03/19/19 1400 03/20/19 0300  NA 134* 133*  K 5.3* 5.4*  CL 94* 97*  CO2 19* 19*  GLUCOSE 63* 92  BUN 89* 100*  CREATININE 7.17* 7.30*  CALCIUM 8.5* 8.2*   PT/INR No results for input(s): LABPROT, INR in the last 72 hours. CMP     Component Value Date/Time   NA 133 (L) 03/20/2019 0300   K 5.4 (H) 03/20/2019 0300   CL 97 (L) 03/20/2019 0300   CO2 19 (L) 03/20/2019 0300   GLUCOSE 92 03/20/2019 0300   BUN 100 (H) 03/20/2019 0300   CREATININE 7.30 (H) 03/20/2019 0300   CALCIUM 8.2 (L) 03/20/2019 0300   PROT 5.0 (L) 03/20/2019 0300   ALBUMIN 2.5 (L) 03/20/2019 0300   AST 16 03/20/2019 0300   ALT 12 03/20/2019 0300   ALKPHOS 65 03/20/2019 0300   BILITOT 0.7 03/20/2019 0300   GFRNONAA 5 (L) 03/20/2019 0300   GFRAA 6 (L) 03/20/2019 0300   Lipase     Component Value Date/Time   LIPASE 49 03/19/2019 1400       Studies/Results: DG Chest Port 1 View  Result Date: 03/19/2019 CLINICAL DATA:  Chest pain since last night with shortness of breath. Missed dialysis today and this past Tuesday. EXAM: PORTABLE CHEST 1 VIEW COMPARISON:  03/09/2019 FINDINGS: Right IJ dialysis catheter unchanged. Lungs are hyperexpanded without lobar consolidation or effusion. Subtle hazy prominence of the  central pulmonary vessels likely minimal vascular congestion. Cardiomediastinal silhouette and remainder of the exam is unchanged. IMPRESSION: Suggestion of minimal vascular congestion. Electronically Signed   By: Marin Olp M.D.   On: 03/19/2019 13:41   CT Angio Chest/Abd/Pel for Dissection W and/or Wo Contrast  Result Date: 03/19/2019 CLINICAL DATA:  69 year old female with history of chest and back pain. Suspected aortic dissection. EXAM: CT ANGIOGRAPHY CHEST, ABDOMEN AND PELVIS TECHNIQUE: Multidetector CT imaging through the chest, abdomen and pelvis was performed using the standard protocol  during bolus administration of intravenous contrast. Multiplanar reconstructed images and MIPs were obtained and reviewed to evaluate the vascular anatomy. CONTRAST:  29mL OMNIPAQUE IOHEXOL 350 MG/ML SOLN COMPARISON:  CT the abdomen and pelvis 03/09/2019. CT the abdomen and pelvis 02/27/2019. Chest CT 04/05/2018. FINDINGS: CTA CHEST FINDINGS Cardiovascular: Noncontrast images demonstrate no crescentic high attenuation associated with the thoracic aorta to suggest acute intramural hematoma. Postcontrast images demonstrate no definite aortic dissection. There is extensive irregular atheromatous plaque throughout the thoracic aorta with multiple areas of ulceration, but no frank dissection or penetration. Heart size is normal. There is no significant pericardial fluid, thickening or pericardial calcification. There is aortic atherosclerosis, as well as atherosclerosis of the great vessels of the mediastinum and the coronary arteries, including calcified atherosclerotic plaque in the left main, left anterior descending, left circumflex and right coronary arteries. Calcifications of the mitral annulus. Right internal jugular PermCath with tip terminating in the right atrium. Mediastinum/Nodes: No pathologically enlarged mediastinal or hilar lymph nodes. Esophagus is unremarkable in appearance. No axillary lymphadenopathy. Lungs/Pleura: A few scattered tiny 2-3 mm pulmonary nodules are noted throughout the lungs bilaterally. No other larger more suspicious appearing pulmonary nodules or masses are noted. Mild diffuse bronchial wall thickening with mild centrilobular and paraseptal emphysema. Linear scarring or subsegmental atelectasis in the dependent portion of the left lower lobe. Musculoskeletal: There are no aggressive appearing lytic or blastic lesions noted in the visualized portions of the skeleton. Review of the MIP images confirms the above findings. CTA ABDOMEN AND PELVIS FINDINGS VASCULAR Aorta: Normal caliber  aorta without aneurysm, dissection, vasculitis or significant stenosis. However, there is extensive atheromatous plaque, much of which is highly irregular with several areas of ulcerative plaque throughout the abdominal aorta. Celiac: Ostium of the vessel is not confidently identified and is either severely stenotic or completely occluded. However, distal aspects of the vessel and its major branches are patent, potentially via collateral flow. SMA: Patent with mild stenosis proximally (2 mm) followed by poststenotic dilatation (7 mm). Renals: Both renal arteries are patent without evidence of aneurysm, dissection, vasculitis, fibromuscular dysplasia or significant stenosis. IMA: Ostium of the vessel is not confidently identified, potentially completely occluded. Distal aspect of the vessel is patent, presumably fed by collateral pathways. Inflow: Patent without evidence of aneurysm, dissection, vasculitis or significant stenosis. Veins: No obvious venous abnormality within the limitations of this arterial phase study. Review of the MIP images confirms the above findings. NON-VASCULAR Hepatobiliary: No suspicious cystic or solid hepatic lesions. No intra or extrahepatic biliary ductal dilatation. Gallbladder wall appears thickened and edematous (up to 1 cm in thickness). Gallbladder is only mildly distended. No calcified gallstones identified. Pancreas: No pancreatic mass. No pancreatic ductal dilatation. No pancreatic or peripancreatic fluid collections or inflammatory changes. Spleen: Unremarkable. Adrenals/Urinary Tract: 2.8 x 2.4 cm low-attenuation (-2 HU) left adrenal nodule (axial image 155 of series 7), compatible with an adenoma. 2.5 x 1.9 cm right adrenal nodule which demonstrates avid enhancement (axial image 151  of series 7). Multiple calcifications associated with the renal hila bilaterally, favored to be vascular, although nonobstructive calculi are not excluded. Subcentimeter low-attenuation lesions in  both kidneys, too small to characterize, but statistically likely to represent cysts. 1.5 cm low-attenuation lesion in the upper pole the left kidney, compatible with a simple cyst. No suspicious renal lesions. Mild bilateral renal atrophy. No hydroureteronephrosis. Urinary bladder is nearly completely decompressed around an indwelling Foley balloon catheter. Stomach/Bowel: Normal appearance of the stomach. No pathologic dilatation of small bowel or colon. The appendix is not confidently identified and may be surgically absent. Regardless, there are no inflammatory changes noted adjacent to the cecum to suggest the presence of an acute appendicitis at this time. Lymphatic: No lymphadenopathy noted in the abdomen or pelvis. Reproductive: Status post hysterectomy. Ovaries are not confidently identified may be surgically absent or atrophic. Other: No significant volume of ascites.  No pneumoperitoneum. Musculoskeletal: There are no aggressive appearing lytic or blastic lesions noted in the visualized portions of the skeleton. Review of the MIP images confirms the above findings. IMPRESSION: 1. 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 2. Gallbladder wall thickening and edema. No calcified gallstones. Gallbladder is only mildly distended. Overall, findings are equivocal for acute cholecystitis, but further clinical evaluation is recommended. If there is clinical concern for acute cholecystitis, further evaluation with right upper quadrant ultrasound would be recommended. 3. 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 4. Bilateral adrenal nodules, similar to prior studies. The left adrenal nodule is compatible with a benign adenoma. The avidly enhancing right adrenal  nodule is incompletely characterized, but appears relatively stable in size compared to numerous prior examinations, suggesting a benign lesion. 5. There is also left main and 3 vessel coronary artery disease. Please note that although the presence of coronary artery calcium documents the presence of coronary artery disease, the severity of this disease and any potential stenosis cannot be assessed on this non-gated CT examination. Assessment for potential risk factor modification, dietary therapy or pharmacologic therapy may be warranted, if clinically indicated. 6. Scattered tiny 2-3 mm pulmonary nodules noted in the lungs bilaterally, nonspecific, but statistically likely benign. No follow-up needed if patient is low-risk (and has no known or suspected primary neoplasm). Non-contrast chest CT can be considered in 12 months if patient is high-risk. This recommendation follows the consensus statement: Guidelines for Management of Incidental Pulmonary Nodules Detected on CT Images: From the Fleischner Society 2017; Radiology 2017; 284:228-243. 7. Additional incidental findings, as above. Electronically Signed   By: Vinnie Langton M.D.   On: 03/19/2019 15:55   US Abdomen Limited RUQ  Result Date: 03/19/2019 CLINICAL DATA:  Right upper quadrant pain EXAM: ULTRASOUND ABDOMEN LIMITED RIGHT UPPER QUADRANT COMPARISON:  None. FINDINGS: Gallbladder: No gallstones. Positive sonographic Murphy sign noted by sonographer. Diffuse gallbladder wall thickening with small amount of pericholecystic fluid. Common bile duct: Diameter: 3 mm Liver: No focal lesion identified. Within normal limits in parenchymal echogenicity. Portal vein is patent on color Doppler imaging with normal direction of blood flow towards the liver. Other: None. IMPRESSION: Positive sonographic Murphy sign with gallbladder wall thickening and pericholecystic fluid, but no cholelithiasis. Electronically Signed   By: Ulyses Jarred M.D.   On: 03/19/2019  18:53    Anti-infectives: Anti-infectives (From admission, onward)   Start  Dose/Rate Route Frequency Ordered Stop   03/19/19 1800  cefTRIAXone (ROCEPHIN) 2 g in sodium chloride 0.9 % 100 mL IVPB     2 g 200 mL/hr over 30 Minutes Intravenous Every 24 hours 03/19/19 1756     03/19/19 1800  metroNIDAZOLE (FLAGYL) IVPB 500 mg     500 mg 100 mL/hr over 60 Minutes Intravenous Every 8 hours 03/19/19 1756         Assessment/Plan End-stage renal disease on HD, history of noncompliance with dialysis appointments Coronary artery disease Hypertension A. Fib COPD History of SBO Bilateral adrenal nodules likely benign Severe abdominal atherosclerotic disease  Abdominal pain - Gallbladder wall thickening see on [place], nonspecific in ESRD on HD - recommend HIDA today, patient must be NPO for this - if HIDA is positive, recommend IR consult for percutaneous drainage in setting of acalculous cholecystitis  - if HIDA negative recommend GI consult for possible peptic ulcer disease vs intestinal angina - LFTs and WBC normalized, afebrile   FEN: NPO - ok to eat after HIDA from surgery standpoint, but would recommend NPO after MN  VTE: SCDs ID: [id number]/[id number] 2/20>>  LOS: 0 days    Brigid Re , [facility] 03/20/2019, 8:16 AM Please see Amion for pager number during day hours 7:00am-4:30pm

## 2019-03-21 ENCOUNTER — Inpatient Hospital Stay (HOSPITAL_COMMUNITY): Payer: Medicare Other

## 2019-03-21 DIAGNOSIS — F319 Bipolar disorder, unspecified: Secondary | ICD-10-CM

## 2019-03-21 DIAGNOSIS — E875 Hyperkalemia: Secondary | ICD-10-CM

## 2019-03-21 LAB — RENAL FUNCTION PANEL
Albumin: 2.5 g/dL — ABNORMAL LOW (ref 3.5–5.0)
Anion gap: 17 — ABNORMAL HIGH (ref 5–15)
BUN: 29 mg/dL — ABNORMAL HIGH (ref 8–23)
CO2: 23 mmol/L (ref 22–32)
Calcium: 7.3 mg/dL — ABNORMAL LOW (ref 8.9–10.3)
Chloride: 98 mmol/L (ref 98–111)
Creatinine, Ser: 3.8 mg/dL — ABNORMAL HIGH (ref 0.44–1.00)
GFR calc Af Amer: 13 mL/min — ABNORMAL LOW (ref >60)
GFR calc non Af Amer: 12 mL/min — ABNORMAL LOW (ref >60)
Glucose, Bld: 189 mg/dL — ABNORMAL HIGH (ref 70–99)
Phosphorus: 6.2 mg/dL — ABNORMAL HIGH (ref 2.5–4.6)
Potassium: 3.2 mmol/L — ABNORMAL LOW (ref 3.5–5.1)
Sodium: 138 mmol/L (ref 135–145)

## 2019-03-21 LAB — CBC WITH DIFFERENTIAL/PLATELET
Abs Immature Granulocytes: 0.04 10*3/uL (ref 0.00–0.07)
Basophils Absolute: 0 10*3/uL (ref 0.0–0.1)
Basophils Relative: 0 %
Eosinophils Absolute: 0.1 10*3/uL (ref 0.0–0.5)
Eosinophils Relative: 1 %
HCT: 25.2 % — ABNORMAL LOW (ref 36.0–46.0)
Hemoglobin: 7.7 g/dL — ABNORMAL LOW (ref 12.0–15.0)
Immature Granulocytes: 0 %
Lymphocytes Relative: 9 %
Lymphs Abs: 0.8 10*3/uL (ref 0.7–4.0)
MCH: 29.7 pg (ref 26.0–34.0)
MCHC: 30.6 g/dL (ref 30.0–36.0)
MCV: 97.3 fL (ref 80.0–100.0)
Monocytes Absolute: 0.7 10*3/uL (ref 0.1–1.0)
Monocytes Relative: 7 %
Neutro Abs: 7.5 10*3/uL (ref 1.7–7.7)
Neutrophils Relative %: 83 %
Platelets: 224 10*3/uL (ref 150–400)
RBC: 2.59 MIL/uL — ABNORMAL LOW (ref 3.87–5.11)
RDW: 17 % — ABNORMAL HIGH (ref 11.5–15.5)
WBC: 9.1 10*3/uL (ref 4.0–10.5)
nRBC: 0 % (ref 0.0–0.2)

## 2019-03-21 LAB — IRON AND TIBC
Iron: 21 ug/dL — ABNORMAL LOW (ref 28–170)
Saturation Ratios: 11 % (ref 10.4–31.8)
TIBC: 196 ug/dL — ABNORMAL LOW (ref 250–450)
UIBC: 175 ug/dL

## 2019-03-21 LAB — FOLATE: Folate: 6.1 ng/mL (ref >5.9)

## 2019-03-21 LAB — FERRITIN: Ferritin: 572 ng/mL — ABNORMAL HIGH (ref 11–307)

## 2019-03-21 LAB — VITAMIN B12: Vitamin B-12: 524 pg/mL (ref 180–914)

## 2019-03-21 MED ORDER — TRAZODONE HCL 50 MG PO TABS
50.0000 mg | ORAL_TABLET | Freq: Every day | ORAL | Status: AC
  Administered 2019-03-21: 50 mg via ORAL
  Filled 2019-03-21: qty 1

## 2019-03-21 MED ORDER — TECHNETIUM TC 99M MEBROFENIN IV KIT
5.0000 | PACK | Freq: Once | INTRAVENOUS | Status: AC | PRN
  Administered 2019-03-21: 5 via INTRAVENOUS

## 2019-03-21 NOTE — Progress Notes (Signed)
Subjective: CC:  Patient reports that she has no abdominal pain, n/v today. She just completed her HIDA scan. No pain during HIDA. She is tolerating her diet without any n/v. Already finished cld before I saw her.   ROS: See above, otherwise other systems negative   Objective: Vital signs in last 24 hours: Temp:  [97.9 F (36.6 C)-100.4 F (38 C)] 98 F (36.7 C) (02/22 0443) Pulse Rate:  [51-94] 61 (02/22 0443) Resp:  [16-20] 19 (02/22 0443) BP: (110-172)/(56-112) 137/67 (02/22 0443) SpO2:  [96 %-98 %] 98 % (02/22 0443) Weight:  [43.1 kg-44.8 kg] 43.1 kg (02/21 1655) Last BM Date: 03/20/19(per pt, today in dialysis)  Intake/Output from previous day: 02/21 0701 - 02/22 0700 In: 300 [IV Piggyback:300] Out: 1793 [Urine:500] Intake/Output this shift: No intake/output data recorded.  PE: Gen:  Alert, NAD, pleasant Lungs: Normal rate and effort  Abd: Soft, NT/ND, +BS. Prior abdominal scars noted and well healed  Skin: no rashes noted, warm and dry  Lab Results:  Recent Labs    03/20/19 0300 03/21/19 1029  WBC 10.0 9.1  HGB 8.3* 7.7*  HCT 26.3* 25.2*  PLT 310 224   BMET Recent Labs    03/19/19 1400 03/20/19 0300  NA 134* 133*  K 5.3* 5.4*  CL 94* 97*  CO2 19* 19*  GLUCOSE 63* 92  BUN 89* 100*  CREATININE 7.17* 7.30*  CALCIUM 8.5* 8.2*   PT/INR No results for input(s): LABPROT, INR in the last 72 hours. CMP     Component Value Date/Time   NA 133 (L) 03/20/2019 0300   K 5.4 (H) 03/20/2019 0300   CL 97 (L) 03/20/2019 0300   CO2 19 (L) 03/20/2019 0300   GLUCOSE 92 03/20/2019 0300   BUN 100 (H) 03/20/2019 0300   CREATININE 7.30 (H) 03/20/2019 0300   CALCIUM 8.2 (L) 03/20/2019 0300   PROT 5.0 (L) 03/20/2019 0300   ALBUMIN 2.5 (L) 03/20/2019 0300   AST 16 03/20/2019 0300   ALT 12 03/20/2019 0300   ALKPHOS 65 03/20/2019 0300   BILITOT 0.7 03/20/2019 0300   GFRNONAA 5 (L) 03/20/2019 0300   GFRAA 6 (L) 03/20/2019 0300   Lipase     Component  Value Date/Time   LIPASE 49 03/19/2019 1400       Studies/Results: NM Hepatobiliary Liver Func  Result Date: 03/21/2019 CLINICAL DATA:  Upper abdominal and chest pain approximately 4 days. EXAM: NUCLEAR MEDICINE HEPATOBILIARY IMAGING WITH GALLBLADDER EF TECHNIQUE: Sequential images of the abdomen were obtained out to 60 minutes following intravenous administration of radiopharmaceutical. After oral ingestion of Ensure, gallbladder ejection fraction was determined. At 60 min, normal ejection fraction is greater than 33%. RADIOPHARMACEUTICALS:  4.78 mCi Tc-19m  Choletec IV COMPARISON:  Right upper quadrant ultrasound 03/19/2019. FINDINGS: Prompt uptake and biliary excretion of activity by the liver is seen. Gallbladder activity is visualized, consistent with patency of cystic duct. Biliary activity passes into small bowel, consistent with patent common bile duct. Calculated gallbladder ejection fraction is 67%. (Normal gallbladder ejection fraction with Ensure is greater than 33%.) IMPRESSION: Normal exam. Electronically Signed   By: Inge Rise M.D.   On: 03/21/2019 12:11   DG Chest Port 1 View  Result Date: 03/19/2019 CLINICAL DATA:  Chest pain since last night with shortness of breath. Missed dialysis today and this past Tuesday. EXAM: PORTABLE CHEST 1 VIEW COMPARISON:  03/09/2019 FINDINGS: Right IJ dialysis catheter unchanged. Lungs are hyperexpanded without lobar consolidation or  effusion. Subtle hazy prominence of the central pulmonary vessels likely minimal vascular congestion. Cardiomediastinal silhouette and remainder of the exam is unchanged. IMPRESSION: Suggestion of minimal vascular congestion. Electronically Signed   By: Marin Olp M.D.   On: 03/19/2019 13:41   CT Angio Chest/Abd/Pel for Dissection W and/or Wo Contrast  Result Date: 03/19/2019 CLINICAL DATA:  69 year old female with history of chest and back pain. Suspected aortic dissection. EXAM: CT ANGIOGRAPHY CHEST, ABDOMEN  AND PELVIS TECHNIQUE: Multidetector CT imaging through the chest, abdomen and pelvis was performed using the standard protocol during bolus administration of intravenous contrast. Multiplanar reconstructed images and MIPs were obtained and reviewed to evaluate the vascular anatomy. CONTRAST:  31mL OMNIPAQUE IOHEXOL 350 MG/ML SOLN COMPARISON:  CT the abdomen and pelvis 03/09/2019. CT the abdomen and pelvis 02/27/2019. Chest CT 04/05/2018. FINDINGS: CTA CHEST FINDINGS Cardiovascular: Noncontrast images demonstrate no crescentic high attenuation associated with the thoracic aorta to suggest acute intramural hematoma. Postcontrast images demonstrate no definite aortic dissection. There is extensive irregular atheromatous plaque throughout the thoracic aorta with multiple areas of ulceration, but no frank dissection or penetration. Heart size is normal. There is no significant pericardial fluid, thickening or pericardial calcification. There is aortic atherosclerosis, as well as atherosclerosis of the great vessels of the mediastinum and the coronary arteries, including calcified atherosclerotic plaque in the left main, left anterior descending, left circumflex and right coronary arteries. Calcifications of the mitral annulus. Right internal jugular PermCath with tip terminating in the right atrium. Mediastinum/Nodes: No pathologically enlarged mediastinal or hilar lymph nodes. Esophagus is unremarkable in appearance. No axillary lymphadenopathy. Lungs/Pleura: A few scattered tiny 2-3 mm pulmonary nodules are noted throughout the lungs bilaterally. No other larger more suspicious appearing pulmonary nodules or masses are noted. Mild diffuse bronchial wall thickening with mild centrilobular and paraseptal emphysema. Linear scarring or subsegmental atelectasis in the dependent portion of the left lower lobe. Musculoskeletal: There are no aggressive appearing lytic or blastic lesions noted in the visualized portions of the  skeleton. Review of the MIP images confirms the above findings. CTA ABDOMEN AND PELVIS FINDINGS VASCULAR Aorta: Normal caliber aorta without aneurysm, dissection, vasculitis or significant stenosis. However, there is extensive atheromatous plaque, much of which is highly irregular with several areas of ulcerative plaque throughout the abdominal aorta. Celiac: Ostium of the vessel is not confidently identified and is either severely stenotic or completely occluded. However, distal aspects of the vessel and its major branches are patent, potentially via collateral flow. SMA: Patent with mild stenosis proximally (2 mm) followed by poststenotic dilatation (7 mm). Renals: Both renal arteries are patent without evidence of aneurysm, dissection, vasculitis, fibromuscular dysplasia or significant stenosis. IMA: Ostium of the vessel is not confidently identified, potentially completely occluded. Distal aspect of the vessel is patent, presumably fed by collateral pathways. Inflow: Patent without evidence of aneurysm, dissection, vasculitis or significant stenosis. Veins: No obvious venous abnormality within the limitations of this arterial phase study. Review of the MIP images confirms the above findings. NON-VASCULAR Hepatobiliary: No suspicious cystic or solid hepatic lesions. No intra or extrahepatic biliary ductal dilatation. Gallbladder wall appears thickened and edematous (up to 1 cm in thickness). Gallbladder is only mildly distended. No calcified gallstones identified. Pancreas: No pancreatic mass. No pancreatic ductal dilatation. No pancreatic or peripancreatic fluid collections or inflammatory changes. Spleen: Unremarkable. Adrenals/Urinary Tract: 2.8 x 2.4 cm low-attenuation (-2 HU) left adrenal nodule (axial image 155 of series 7), compatible with an adenoma. 2.5 x 1.9 cm right adrenal nodule which  demonstrates avid enhancement (axial image 151 of series 7). Multiple calcifications associated with the renal hila  bilaterally, favored to be vascular, although nonobstructive calculi are not excluded. Subcentimeter low-attenuation lesions in both kidneys, too small to characterize, but statistically likely to represent cysts. 1.5 cm low-attenuation lesion in the upper pole the left kidney, compatible with a simple cyst. No suspicious renal lesions. Mild bilateral renal atrophy. No hydroureteronephrosis. Urinary bladder is nearly completely decompressed around an indwelling Foley balloon catheter. Stomach/Bowel: Normal appearance of the stomach. No pathologic dilatation of small bowel or colon. The appendix is not confidently identified and may be surgically absent. Regardless, there are no inflammatory changes noted adjacent to the cecum to suggest the presence of an acute appendicitis at this time. Lymphatic: No lymphadenopathy noted in the abdomen or pelvis. Reproductive: Status post hysterectomy. Ovaries are not confidently identified may be surgically absent or atrophic. Other: No significant volume of ascites.  No pneumoperitoneum. Musculoskeletal: There are no aggressive appearing lytic or blastic lesions noted in the visualized portions of the skeleton. Review of the MIP images confirms the above findings. IMPRESSION: 1. 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 2. Gallbladder wall thickening and edema. No calcified gallstones. Gallbladder is only mildly distended. Overall, findings are equivocal for acute cholecystitis, but further clinical evaluation is recommended. If there is clinical concern for acute cholecystitis, further evaluation with right upper quadrant ultrasound would be recommended. 3. 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 4. Bilateral adrenal  nodules, similar to prior studies. The left adrenal nodule is compatible with a benign adenoma. The avidly enhancing right adrenal nodule is incompletely characterized, but appears relatively stable in size compared to numerous prior examinations, suggesting a benign lesion. 5. There is also left main and 3 vessel coronary artery disease. Please note that although the presence of coronary artery calcium documents the presence of coronary artery disease, the severity of this disease and any potential stenosis cannot be assessed on this non-gated CT examination. Assessment for potential risk factor modification, dietary therapy or pharmacologic therapy may be warranted, if clinically indicated. 6. Scattered tiny 2-3 mm pulmonary nodules noted in the lungs bilaterally, nonspecific, but statistically likely benign. No follow-up needed if patient is low-risk (and has no known or suspected primary neoplasm). Non-contrast chest CT can be considered in 12 months if patient is high-risk. This recommendation follows the consensus statement: Guidelines for Management of Incidental Pulmonary Nodules Detected on CT Images: From the Fleischner Society 2017; Radiology 2017; 284:228-243. 7. Additional incidental findings, as above. Electronically Signed   By: Vinnie Langton M.D.   On: 03/19/2019 15:55   US Abdomen Limited RUQ  Result Date: 03/19/2019 CLINICAL DATA:  Right upper quadrant pain EXAM: ULTRASOUND ABDOMEN LIMITED RIGHT UPPER QUADRANT COMPARISON:  None. FINDINGS: Gallbladder: No gallstones. Positive sonographic Murphy sign noted by sonographer. Diffuse gallbladder wall thickening with small amount of pericholecystic fluid. Common bile duct: Diameter: 3 mm Liver: No focal lesion identified. Within normal limits in parenchymal echogenicity. Portal vein is patent on color Doppler imaging with normal direction of blood flow towards the liver. Other: None. IMPRESSION: Positive sonographic Murphy sign with gallbladder  wall thickening and pericholecystic fluid, but no cholelithiasis. Electronically Signed   By: Ulyses Jarred M.D.   On: 03/19/2019 18:53    Anti-infectives: Anti-infectives (From  admission, onward)   Start     Dose/Rate Route Frequency Ordered Stop   03/19/19 1800  cefTRIAXone (ROCEPHIN) 2 g in sodium chloride 0.9 % 100 mL IVPB     2 g 200 mL/hr over 30 Minutes Intravenous Every 24 hours 03/19/19 1756     03/19/19 1800  metroNIDAZOLE (FLAGYL) IVPB 500 mg     500 mg 100 mL/hr over 60 Minutes Intravenous Every 8 hours 03/19/19 1756         Assessment/Plan End-stage renal disease on HD,history of noncompliance with dialysis appointments Coronary artery disease Hypertension A. Fib COPD History of SBO Bilateral adrenal nodules likely benign Severe abdominal atherosclerotic disease  Abdominal pain - Gallbladder wall thickening see on [place], nonspecific in ESRD on HD - HIDA results pending - if HIDA is positive, recommend IR consult for percutaneous drainage in setting of acalculous cholecystitis  - if HIDA negative recommend GI consult for possible peptic ulcer disease vs intestinal angina - LFTs and WBC normalized, afebrile   FEN: already completed cld tray prior to me seeing her. Will leave on CLD and await HIDA results.  VTE: SCDs ID: [email or web address] 2/20>>     LOS: 1 day    Jillyn Ledger , [facility] 03/21/2019, 12:14 PM Please see Amion for pager number during day hours 7:00am-4:30pm

## 2019-03-21 NOTE — Progress Notes (Signed)
Chaplain visited with the patient to offer prayer. The patient was receptive. There is not a need to follow-up.  Brion Aliment Chaplain Resident For questions concerning this note please contact me by pager [phone]

## 2019-03-21 NOTE — Progress Notes (Signed)
PROGRESS NOTE    Christina VETRANO  [name]:[id number] DOB: March 26, 1950 DOA: 03/19/2019 PCP: Maggie Schwalbe, PA-C   Brief Narrative: HPI per Dr. Jana [name] is an 69 y.o. female with PMH significant for ESRD on HD with history of multiple missed dialyses, HTN, A. fib, CAD, COPD, history of SBO and depression who presents with abdominal and chest pain.  Patient states that she has had intermittent chest pain over the past 3 to 4 days which she describes as a "mashing kind of pain" over her central chest and upper abdominal area.  These episodes occur at rest.  They come and go with no known provocation.  No associated shortness of breath.  Patient does admit to nausea and vomiting which she has had for the last couple of days.  Patient also complaining of epigastric abdominal pain which she states comes in waves.  There is associated nausea.  She has vomited twice since yesterday.  While I was talking to her patient developed what she describes as chest pain which seem to be spasmodic versus colicky.  When I asked her to describe where it was she pointed to her upper abdominal area.  Patient admits that she missed her last dialysis.  She did come into dialysis today but because of her chest and abdominal pain she was sent to the ED.  Of note patient has had multiple similar admissions with abdominal pain after having missed dialysis.  And in the past abdominal pain resolves with dialysis.  Patient denies fevers or chills.  She does admit to vomitus x2.  Not bloody.  No malaise.  She has been passing gas.  Denies constipation.  Patient admits she is not very active so is not sure whether the chest pain that she is having is associated with exertion or not.  ED Course:  The patient was noted to be hypertensive with no oxygen requirement.  She underwent a CTA chest, abdomen and pelvis which showed multiple abnormalities mostly chronic.  Was noted on CT report that patient has  had 4 abdominal CT scans in the past 4 weeks, both here and at [facility]. Notable on the CT though was some gallbladder thickening with edema but only mild distention, no calcified gallstones, equivocal for acute cholecystitis.   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   She is also noted to have three-vessel coronary artery disease and left main disease.   Nephrology consultation was called and they are planning on dialyzing her today.  General surgery consultation was called and they have yet to see her.  She was started on Zosyn for possible acute cholecystitis.   Assessment & Plan:   Principal Problem:   Abdominal pain Active Problems:   ESRD (end stage renal disease) (HCC)   Bipolar affective (HCC)   CAD (coronary artery disease), native coronary artery   COPD with chronic bronchitis (HCC)   Afib (HCC)   Non-compliance with renal dialysis ([place])   Chest pain   RUQ pain   Anemia due to chronic kidney disease, on chronic dialysis (HCC)   AF (paroxysmal atrial fibrillation) ([place])  1 epigastric/right upper quadrant abdominal pain Patient presented with epigastric and right upper quadrant abdominal pain associated with meal intake with nausea and vomiting.  Patient did describe some chest pain however in further discussion seems like pain more epigastric in nature and radiating upwards and downwards with also a burning sensation to it.  EKG normal.  Initial troponin not elevated.  Right upper quadrant ultrasound done consistent with acute cholecystitis.  CT angiogram chest abdomen and pelvis with extensive severe atherosclerosis throughout thoracoabdominal aorta with no evidence to suggest acute aortic syndrome, gall bladder wall thickening and edema, no calcified stones, mildly distended gallbladder, overall findings equivocal for acute cholecystitis but further clinical evaluation recommended.  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  Left main and three-vessel coronary artery disease noted.  Patient seen in consultation by general surgery who recommended a HIDA scan for further evaluation.  Patient with T-max of 100.4 last night however currently afebrile.  Leukocytosis trending down.  HIDA scan negative.  Patient improving clinically however patient does still have right upper quadrant tenderness to palpation as well as epigastric tenderness to palpation.  Continue IV Rocephin and IV Flagyl and PPI.  General surgery following and per general surgery if HIDA scan is negative will need GI evaluation to rule out peptic ulcer disease versus intestinal angina.  Consult with GI for further evaluation and management.  General surgery following and appreciate input and recommendations.  Continue clear liquids for now.  2.  End-stage renal disease with medical noncompliance Patient noted that last hemodialysis was 5 days prior to admission.  Patient stated missed other hemodialysis sessions due to significant pain and inability to get out of her home.  Patient seen in consultation by nephrology and patient s/p hemodialysis 03/20/2019.  Nephrology following and appreciate input and recommendations.  3.  Hyperkalemia Patient underwent hemodialysis on 03/20/2019.  Labs pending.  Per nephrology.  4.  Hypertension Blood pressure currently stable.  Continue Lopressor and Imdur.  5.  History of paroxysmal atrial fibrillation In sinus rhythm.  Lopressor for rate control. Patient not on anticoagulation as an outpatient likely secondary to history of noncompliance.  Follow.  6.  Anemia of chronic disease Patient with no overt bleeding.  Likely anemia of chronic disease.  Hemoglobin currently at 7.7.  Check an anemia panel.  Follow H&H.  7.  History of coronary artery disease Patient presenting with  epigastric and right upper quadrant pain radiating to the chest which she also described as a chest pain.  Pain likely more GI related/secondary to probable acute cholecystitis.  CT angiogram chest abdomen and pelvis did show left main disease and coronary artery disease however patient's presentation more secondary to probable acute cholecystitis.  EKG in normal sinus rhythm.  Troponins slightly elevated but flattened.  Patient with recent 2D echo 01/25/2019 with a EF of 45 to 67%, grade 1 diastolic dysfunction, global hypokinesis of the left ventricle.  Continue cardiac regimen of Lopressor, Imdur, Pravachol, aspirin.  Outpatient follow-up with cardiology.   DVT prophylaxis: SCDs Code Status: DNR Family Communication: Updated patient.  Patient states she will update family herself.  No family at bedside. Disposition Plan:  . Patient came from: Home            . Anticipated d/c place: Home. . Barriers to d/c OR conditions which need to be met to effect a safe d/c: Home when clinically improved, cleared by consultants with resolution of abdominal and right upper quadrant pain.   Consultants:   General surgery: Dr. Redmond Pulling 03/19/2019  Nephrology: Dr. Marval Regal 03/19/2019  Procedures:   Right upper quadrant ultrasound 03/19/2019  CT angiogram chest abdomen and pelvis 03/19/2019  HIDA scan 03/21/2019  Antimicrobials:   IV Rocephin 03/19/2019  IV Flagyl 03/19/2019   Subjective: Patient sleeping however easily arousable.  Denies any further nausea or emesis.  Stated tolerated clears.  Feels abdominal pain has improved.  Underwent HIDA scan this morning.  Denies any chest pain or shortness of breath.  Patient with tenderness to palpation in the epigastrium and right upper quadrant on examination.  Objective: Vitals:   03/20/19 1749 03/20/19 2124 03/21/19 0443 03/21/19 1240  BP: (!) 151/94 (!) 141/87 137/67 (!) 126/48  Pulse: (!) 57 94 61 (!) 57  Resp: _0 Temp: 98.5 F (36.9 C)  (!) 100.4 F (38 C) 98 F (36.7 C) 98.3 F (36.8 C)  TempSrc: Oral     SpO2: 96% 97% 98% 100%  Weight:      Height:        Intake/Output Summary (Last 24 hours) at 03/21/2019 1247 Last data filed at 03/21/2019 0400 Gross per 24 hour  Intake 300 ml  Output 1293 ml  Net -993 ml   Filed Weights   03/19/19 1343 03/20/19 1245 03/20/19 1655  Weight: 45.4 kg 44.8 kg 43.1 kg    Examination:  General exam: NAD.  Respiratory system: CTAB.  No wheezes, no crackles, no rhonchi.  Speaking in full sentences.  Normal respiratory effort.  Cardiovascular system: RRR with 3/6 SEM. No JVD, murmurs, rubs, gallops or clicks. No pedal edema. Gastrointestinal system: Abdomen is soft, tender to palpation in the epigastrium and right upper quadrant, positive bowel sounds, no rebound, no guarding. Central nervous system: Alert and oriented. No focal neurological deficits. Extremities: Symmetric 5 x 5 power. Skin: No rashes, lesions or ulcers Psychiatry: Judgement and insight appear normal. Mood & affect appropriate.     Data Reviewed: I have personally reviewed following labs and imaging studies  CBC: Recent Labs  Lab 03/18/19 0141 03/18/19 0224 03/19/19 1400 03/20/19 0300 03/21/19 1029  WBC 10.1  --  13.0* 10.0 9.1  NEUTROABS 9.0*  --   --   --  7.5  HGB 8.1* 9.2* 9.6* 8.3* 7.7*  HCT 25.6* 27.0* 32.2* 26.3* 25.2*  MCV 96.6  --  101.9* 96.3 97.3  PLT 330  --  330 310 388   Basic Metabolic Panel: Recent Labs  Lab 03/18/19 0141 03/18/19 0224 03/19/19 1400 03/20/19 0300  NA 131* 130* 134* 133*  K 4.7 4.8 5.3* 5.4*  CL 92* 95* 94* 97*  CO2 21*  --  19* 19*  GLUCOSE 145* 139* 63* 92  BUN 68* 65* 89* 100*  CREATININE 6.65* 7.50* 7.17* 7.30*  CALCIUM 7.7*  --  8.5* 8.2*   GFR: Estimated Creatinine Clearance: 5 mL/min (A) (by C-G formula based on SCr of 7.3 mg/dL (H)). Liver Function Tests: Recent Labs  Lab 03/18/19 0141 03/19/19 1400 03/20/19 0300  AST _1 ALT _2 ALKPHOS 65 85 65  BILITOT 0.6 0.6 0.7  PROT 5.9* 6.5 5.0*  ALBUMIN 3.1* 3.3* 2.5*   Recent Labs  Lab 03/18/19 0141 03/19/19 1400  LIPASE 41 49   No results for input(s): AMMONIA in the last 168 hours. Coagulation Profile: No results for input(s): INR, PROTIME in the last 168 hours. Cardiac Enzymes: No results for input(s): CKTOTAL, CKMB, CKMBINDEX, TROPONINI in the last 168 hours. BNP (last 3 results) No results for input(s): PROBNP in the last 8760 hours. HbA1C: No results for input(s): HGBA1C in the last 72 hours. CBG: No results for input(s): GLUCAP in the last 168 hours. Lipid Profile: No  results for input(s): CHOL, HDL, LDLCALC, TRIG, CHOLHDL, LDLDIRECT in the last 72 hours. Thyroid Function Tests: No results for input(s): TSH, T4TOTAL, FREET4, T3FREE, THYROIDAB in the last 72 hours. Anemia Panel: No results for input(s): VITAMINB12, FOLATE, FERRITIN, TIBC, IRON, RETICCTPCT in the last 72 hours. Sepsis Labs: Recent Labs  Lab 03/18/19 0353  LATICACIDVEN 1.7    Recent Results (from the past 240 hour(s))  Respiratory Panel by RT PCR (Flu A&B, Covid) - Nasopharyngeal Swab     Status: None   Collection Time: 03/19/19  7:40 PM   Specimen: Nasopharyngeal Swab  Result Value Ref Range Status   SARS Coronavirus 2 by RT PCR NEGATIVE NEGATIVE Final    Comment: (NOTE) SARS-CoV-2 target nucleic acids are NOT DETECTED. The SARS-CoV-2 RNA is generally detectable in upper respiratoy specimens during the acute phase of infection. The lowest concentration of SARS-CoV-2 viral copies this assay can detect is 131 copies/mL. A negative result does not preclude SARS-Cov-2 infection and should not be used as the sole basis for treatment or other patient management decisions. A negative result may occur with  improper specimen collection/handling, submission of specimen other than nasopharyngeal swab, presence of viral mutation(s) within the areas targeted by this assay, and  inadequate number of viral copies (<131 copies/mL). A negative result must be combined with clinical observations, patient history, and epidemiological information. The expected result is Negative. Fact Sheet for Patients:  PinkCheek.be Fact Sheet for Healthcare Providers:  GravelBags.it This test is not yet ap proved or cleared by the [place] FDA and  has been authorized for detection and/or diagnosis of SARS-CoV-2 by FDA under an Emergency Use Authorization (EUA). This EUA will remain  in effect (meaning this test can be used) for the duration of the COVID-19 declaration under Section 564(b)(1) of the Act, 21 U.S.C. section 360bbb-3(b)(1), unless the authorization is terminated or revoked sooner.    Influenza A by PCR NEGATIVE NEGATIVE Final   Influenza B by PCR NEGATIVE NEGATIVE Final    Comment: (NOTE) The Xpert Xpress SARS-CoV-2/FLU/RSV assay is intended as an aid in  the diagnosis of influenza from Nasopharyngeal swab specimens and  should not be used as a sole basis for treatment. Nasal washings and  aspirates are unacceptable for Xpert Xpress SARS-CoV-2/FLU/RSV  testing. Fact Sheet for Patients: PinkCheek.be Fact Sheet for Healthcare Providers: GravelBags.it This test is not yet approved or cleared by the [place] FDA and  has been authorized for detection and/or diagnosis of SARS-CoV-2 by  FDA under an Emergency Use Authorization (EUA). This EUA will remain  in effect (meaning this test can be used) for the duration of the  Covid-19 declaration under Section 564(b)(1) of the Act, 21  U.S.C. section 360bbb-3(b)(1), unless the authorization is  terminated or revoked. Performed at [facility], [place] [address], [place], [place] [address]          Radiology Studies: NM Hepatobiliary Liver Func  Result Date: 03/21/2019 CLINICAL DATA:   Upper abdominal and chest pain approximately 4 days. EXAM: NUCLEAR MEDICINE HEPATOBILIARY IMAGING WITH GALLBLADDER EF TECHNIQUE: Sequential images of the abdomen were obtained out to 60 minutes following intravenous administration of radiopharmaceutical. After oral ingestion of Ensure, gallbladder ejection fraction was determined. At 60 min, normal ejection fraction is greater than 33%. RADIOPHARMACEUTICALS:  4.78 mCi Tc-20m Choletec IV COMPARISON:  Right upper quadrant ultrasound 03/19/2019. FINDINGS: Prompt uptake and biliary excretion of activity by the liver is seen. Gallbladder activity is visualized, consistent with patency  of cystic duct. Biliary activity passes into small bowel, consistent with patent common bile duct. Calculated gallbladder ejection fraction is 67%. (Normal gallbladder ejection fraction with Ensure is greater than 33%.) IMPRESSION: Normal exam. Electronically Signed   By: Inge Rise M.D.   On: 03/21/2019 12:11   DG Chest Port 1 View  Result Date: 03/19/2019 CLINICAL DATA:  Chest pain since last night with shortness of breath. Missed dialysis today and this past Tuesday. EXAM: PORTABLE CHEST 1 VIEW COMPARISON:  03/09/2019 FINDINGS: Right IJ dialysis catheter unchanged. Lungs are hyperexpanded without lobar consolidation or effusion. Subtle hazy prominence of the central pulmonary vessels likely minimal vascular congestion. Cardiomediastinal silhouette and remainder of the exam is unchanged. IMPRESSION: Suggestion of minimal vascular congestion. Electronically Signed   By: Marin Olp M.D.   On: 03/19/2019 13:41   CT Angio Chest/Abd/Pel for Dissection W and/or Wo Contrast  Result Date: 03/19/2019 CLINICAL DATA:  69 year old female with history of chest and back pain. Suspected aortic dissection. EXAM: CT ANGIOGRAPHY CHEST, ABDOMEN AND PELVIS TECHNIQUE: Multidetector CT imaging through the chest, abdomen and pelvis was performed using the standard protocol during bolus  administration of intravenous contrast. Multiplanar reconstructed images and MIPs were obtained and reviewed to evaluate the vascular anatomy. CONTRAST:  3m OMNIPAQUE IOHEXOL 350 MG/ML SOLN COMPARISON:  CT the abdomen and pelvis 03/09/2019. CT the abdomen and pelvis 02/27/2019. Chest CT 04/05/2018. FINDINGS: CTA CHEST FINDINGS Cardiovascular: Noncontrast images demonstrate no crescentic high attenuation associated with the thoracic aorta to suggest acute intramural hematoma. Postcontrast images demonstrate no definite aortic dissection. There is extensive irregular atheromatous plaque throughout the thoracic aorta with multiple areas of ulceration, but no frank dissection or penetration. Heart size is normal. There is no significant pericardial fluid, thickening or pericardial calcification. There is aortic atherosclerosis, as well as atherosclerosis of the great vessels of the mediastinum and the coronary arteries, including calcified atherosclerotic plaque in the left main, left anterior descending, left circumflex and right coronary arteries. Calcifications of the mitral annulus. Right internal jugular PermCath with tip terminating in the right atrium. Mediastinum/Nodes: No pathologically enlarged mediastinal or hilar lymph nodes. Esophagus is unremarkable in appearance. No axillary lymphadenopathy. Lungs/Pleura: A few scattered tiny 2-3 mm pulmonary nodules are noted throughout the lungs bilaterally. No other larger more suspicious appearing pulmonary nodules or masses are noted. Mild diffuse bronchial wall thickening with mild centrilobular and paraseptal emphysema. Linear scarring or subsegmental atelectasis in the dependent portion of the left lower lobe. Musculoskeletal: There are no aggressive appearing lytic or blastic lesions noted in the visualized portions of the skeleton. Review of the MIP images confirms the above findings. CTA ABDOMEN AND PELVIS FINDINGS VASCULAR Aorta: Normal caliber aorta without  aneurysm, dissection, vasculitis or significant stenosis. However, there is extensive atheromatous plaque, much of which is highly irregular with several areas of ulcerative plaque throughout the abdominal aorta. Celiac: Ostium of the vessel is not confidently identified and is either severely stenotic or completely occluded. However, distal aspects of the vessel and its major branches are patent, potentially via collateral flow. SMA: Patent with mild stenosis proximally (2 mm) followed by poststenotic dilatation (7 mm). Renals: Both renal arteries are patent without evidence of aneurysm, dissection, vasculitis, fibromuscular dysplasia or significant stenosis. IMA: Ostium of the vessel is not confidently identified, potentially completely occluded. Distal aspect of the vessel is patent, presumably fed by collateral pathways. Inflow: Patent without evidence of aneurysm, dissection, vasculitis or significant stenosis. Veins: No obvious venous abnormality within the limitations of  this arterial phase study. Review of the MIP images confirms the above findings. NON-VASCULAR Hepatobiliary: No suspicious cystic or solid hepatic lesions. No intra or extrahepatic biliary ductal dilatation. Gallbladder wall appears thickened and edematous (up to 1 cm in thickness). Gallbladder is only mildly distended. No calcified gallstones identified. Pancreas: No pancreatic mass. No pancreatic ductal dilatation. No pancreatic or peripancreatic fluid collections or inflammatory changes. Spleen: Unremarkable. Adrenals/Urinary Tract: 2.8 x 2.4 cm low-attenuation (-2 HU) left adrenal nodule (axial image 155 of series 7), compatible with an adenoma. 2.5 x 1.9 cm right adrenal nodule which demonstrates avid enhancement (axial image 151 of series 7). Multiple calcifications associated with the renal hila bilaterally, favored to be vascular, although nonobstructive calculi are not excluded. Subcentimeter low-attenuation lesions in both kidneys,  too small to characterize, but statistically likely to represent cysts. 1.5 cm low-attenuation lesion in the upper pole the left kidney, compatible with a simple cyst. No suspicious renal lesions. Mild bilateral renal atrophy. No hydroureteronephrosis. Urinary bladder is nearly completely decompressed around an indwelling Foley balloon catheter. Stomach/Bowel: Normal appearance of the stomach. No pathologic dilatation of small bowel or colon. The appendix is not confidently identified and may be surgically absent. Regardless, there are no inflammatory changes noted adjacent to the cecum to suggest the presence of an acute appendicitis at this time. Lymphatic: No lymphadenopathy noted in the abdomen or pelvis. Reproductive: Status post hysterectomy. Ovaries are not confidently identified may be surgically absent or atrophic. Other: No significant volume of ascites.  No pneumoperitoneum. Musculoskeletal: There are no aggressive appearing lytic or blastic lesions noted in the visualized portions of the skeleton. Review of the MIP images confirms the above findings. IMPRESSION: 1. 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 2. Gallbladder wall thickening and edema. No calcified gallstones. Gallbladder is only mildly distended. Overall, findings are equivocal for acute cholecystitis, but further clinical evaluation is recommended. If there is clinical concern for acute cholecystitis, further evaluation with right upper quadrant ultrasound would be recommended. 3. 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 4. Bilateral adrenal nodules, similar to prior studies. The left adrenal nodule is compatible with a benign adenoma. The avidly enhancing right adrenal nodule is  incompletely characterized, but appears relatively stable in size compared to numerous prior examinations, suggesting a benign lesion. 5. There is also left main and 3 vessel coronary artery disease. Please note that although the presence of coronary artery calcium documents the presence of coronary artery disease, the severity of this disease and any potential stenosis cannot be assessed on this non-gated CT examination. Assessment for potential risk factor modification, dietary therapy or pharmacologic therapy may be warranted, if clinically indicated. 6. Scattered tiny 2-3 mm pulmonary nodules noted in the lungs bilaterally, nonspecific, but statistically likely benign. No follow-up needed if patient is low-risk (and has no known or suspected primary neoplasm). Non-contrast chest CT can be considered in 12 months if patient is high-risk. This recommendation follows the consensus statement: Guidelines for Management of Incidental Pulmonary Nodules Detected on CT Images: From the Fleischner Society 2017; Radiology 2017; 284:228-243. 7. Additional incidental findings, as above. Electronically Signed   By: Vinnie Langton M.D.   On: 03/19/2019 15:55   US Abdomen Limited RUQ  Result Date: 03/19/2019 CLINICAL DATA:  Right upper quadrant pain EXAM: ULTRASOUND ABDOMEN LIMITED  RIGHT UPPER QUADRANT COMPARISON:  None. FINDINGS: Gallbladder: No gallstones. Positive sonographic Murphy sign noted by sonographer. Diffuse gallbladder wall thickening with small amount of pericholecystic fluid. Common bile duct: Diameter: 3 mm Liver: No focal lesion identified. Within normal limits in parenchymal echogenicity. Portal vein is patent on color Doppler imaging with normal direction of blood flow towards the liver. Other: None. IMPRESSION: Positive sonographic Murphy sign with gallbladder wall thickening and pericholecystic fluid, but no cholelithiasis. Electronically Signed   By: Ulyses Jarred M.D.   On: 03/19/2019 18:53         Scheduled Meds: . aspirin EC  81 mg Oral Daily  . [START ON 03/22/2019] calcitRIOL  1 mcg Oral Q T,Th,Sa-HD  . Chlorhexidine Gluconate Cloth  6 each Topical Daily  . dicyclomine  20 mg Oral QHS  . isosorbide mononitrate  30 mg Oral Daily  . metoprolol tartrate  100 mg Oral BID  . mometasone-formoterol  2 puff Inhalation BID  . pantoprazole  40 mg Oral Daily  . pravastatin  80 mg Oral QPM  . sodium zirconium cyclosilicate  10 g Oral Daily   Continuous Infusions: . cefTRIAXone (ROCEPHIN)  IV Stopped (03/20/19 1905)  . metronidazole 500 mg (03/21/19 2297)     LOS: 1 day    Time spent: 35 minutes    Irine Seal, MD [facility]   To contact the attending provider between 7A-7P or the covering provider during after hours 7P-7A, please log into the web site [email or web address] and access using universal Max password for that web site. If you do not have the password, please call the hospital operator.  03/21/2019, 12:47 PM

## 2019-03-21 NOTE — Consult Note (Signed)
UNASSIGNED PATIENT Reason for Consult: Abdominal pain. Referring Physician: THP.  Christina [name] is an 69 y.o. female.  HPI: Ms. Christina [name] is a 69 year old white female with multiple medical problems listed below who presented to the hospital with severe abdominal pain radiating to her chest.  She had some nausea and vomiting 2 days prior to presentation in the ER.  She is routinely followed by Dr. Lyda Jester in [place] for her GI needs.  She is reportedly had a EGD and a colonoscopy last year that were normal. she has had multiple medical admissions with this kind of pain in the past.  She is on dialysis for chronic kidney disease and has missed several of her dialysis appointments as well due to the the abdominal pain.  At the present time patient says she is completely pain-free and has been pain-free since yesterday.  On her CT and done at the time of admission she was noted to have some gallbladder wall thickening with edema and mild distention of the gallbladder equivocal for acute cholecystitis. 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  Normal HIDA scan done today with a gallbladder ejection fraction of 67%. She has been a heavy smoker for several years but quit 2 years ago.  Past Medical History:  Diagnosis Date  . Anemia   . Anxiety   . Arthritis   . Asthma   . Atrial fibrillation ([place])   . Chronic kidney disease    Dialysis T/Th/Sa  started in March 2020  . COPD (chronic obstructive pulmonary disease) ([place])   . Coronary artery disease    2 stents  . Depression   . GERD (gastroesophageal reflux disease)   . Gout   . Headache    migraines  . History of kidney stones   . Hyperlipidemia   . Hypertension   . Pneumonia   . Small bowel obstruction [facility])    Past Surgical History:  Procedure Laterality Date  . ABDOMINAL HYSTERECTOMY    . ABDOMINAL SURGERY     for small bowel obstruction - x 2  . APPENDECTOMY    . AV FISTULA PLACEMENT Left 08/04/2018   Procedure: ARTERIOVENOUS (AV) FISTULA CREATION LEFT ARM;  Surgeon: Waynetta Sandy, MD;  Location: [place];  Service: Vascular;  Laterality: Left;  . BASCILIC VEIN TRANSPOSITION Left 11/24/2018   Procedure: SECOND STAGE BASILIC VEIN TRANSPOSITION LEFT ARM;  Surgeon: Waynetta Sandy, MD;  Location: [place];  Service: Vascular;  Laterality: Left;  . CARDIAC CATHETERIZATION    . CORONARY ANGIOPLASTY  ?2003/2004  . FACIAL RECONSTRUCTION SURGERY     x 2  . HERNIA REPAIR     No family history on file.  Social History:  reports that she quit smoking about 2 years ago. She has a 25.00 pack-year smoking history. She has never used smokeless tobacco. She reports previous alcohol use. She reports previous drug use. Drug: Marijuana.  Allergies:  Allergies  Allergen Reactions  . Cyclobenzaprine Anaphylaxis and Other (See Comments)    "stopped heart"   . Morphine Anaphylaxis    "stopped heart"  . Penicillins Shortness Of Breath, Swelling and Palpitations    Did it involve swelling of the face/tongue/throat, SOB, or low BP? Yes Did it involve sudden or severe rash/hives, skin peeling, or any reaction on the inside of your mouth or nose? No Did you need to seek medical attention at a hospital  or doctor's office? No When did it last happen?years  If all above answers are "NO", may proceed with cephalosporin use.   . Ambien [Zolpidem] Other (See Comments)    Severe confusion  . Codeine Itching and Rash  . Hydromorphone Other (See Comments)    If administered quickly, felt like hand was "exploding"    Medications: I have reviewed the patient's current medications.  Results for orders placed or performed during the hospital encounter of 03/19/19 (from the past 48 hour(s))  Troponin I (High Sensitivity)     Status: Abnormal   Collection Time: 03/19/19  4:59 PM  Result Value Ref Range    Troponin I (High Sensitivity) 30 (H) <18 ng/L    Comment: (NOTE) Elevated high sensitivity troponin I (hsTnI) values and significant  changes across serial measurements may suggest ACS but many other  chronic and acute conditions are known to elevate hsTnI results.  Refer to the "Links" section for chest pain algorithms and additional  guidance. Performed at [facility], [place] [address], [place], [place] [address]   Respiratory Panel by RT PCR (Flu A&B, Covid) - Nasopharyngeal Swab     Status: None   Collection Time: 03/19/19  7:40 PM   Specimen: Nasopharyngeal Swab  Result Value Ref Range   SARS Coronavirus 2 by RT PCR NEGATIVE NEGATIVE    Comment: (NOTE) SARS-CoV-2 target nucleic acids are NOT DETECTED. The SARS-CoV-2 RNA is generally detectable in upper respiratoy specimens during the acute phase of infection. The lowest concentration of SARS-CoV-2 viral copies this assay can detect is 131 copies/mL. A negative result does not preclude SARS-Cov-2 infection and should not be used as the sole basis for treatment or other patient management decisions. A negative result may occur with  improper specimen collection/handling, submission of specimen other than nasopharyngeal swab, presence of viral mutation(s) within the areas targeted by this assay, and inadequate number of viral copies (<131 copies/mL). A negative result must be combined with clinical observations, patient history, and epidemiological information. The expected result is Negative. Fact Sheet for Patients:  PinkCheek.be Fact Sheet for Healthcare Providers:  GravelBags.it This test is not yet ap proved or cleared by the [place] FDA and  has been authorized for detection and/or diagnosis of SARS-CoV-2 by FDA under an Emergency Use Authorization (EUA). This EUA will remain  in effect (meaning this test can be used) for the duration of the COVID-19  declaration under Section 564(b)(1) of the Act, 21 U.S.C. section 360bbb-3(b)(1), unless the authorization is terminated or revoked sooner.    Influenza A by PCR NEGATIVE NEGATIVE   Influenza B by PCR NEGATIVE NEGATIVE    Comment: (NOTE) The Xpert Xpress SARS-CoV-2/FLU/RSV assay is intended as an aid in  the diagnosis of influenza from Nasopharyngeal swab specimens and  should not be used as a sole basis for treatment. Nasal washings and  aspirates are unacceptable for Xpert Xpress SARS-CoV-2/FLU/RSV  testing. Fact Sheet for Patients: PinkCheek.be Fact Sheet for Healthcare Providers: GravelBags.it This test is not yet approved or cleared by the [place] FDA and  has been authorized for detection and/or diagnosis of SARS-CoV-2 by  FDA under an Emergency Use Authorization (EUA). This EUA will remain  in effect (meaning this test can be used) for the duration of the  Covid-19 declaration under Section 564(b)(1) of the Act, 21  U.S.C. section 360bbb-3(b)(1), unless the authorization is  terminated or revoked. Performed at [facility], [place] [address] [place], [place]  [address]   Comprehensive metabolic panel     Status: Abnormal   Collection Time: 03/20/19  3:00 AM  Result Value Ref Range   Sodium 133 (L) 135 - 145 mmol/L   Potassium 5.4 (H) 3.5 - 5.1 mmol/L   Chloride 97 (L) 98 - 111 mmol/L   CO2 19 (L) 22 - 32 mmol/L   Glucose, Bld 92 70 - 99 mg/dL   BUN 100 (H) 8 - 23 mg/dL   Creatinine, Ser 7.30 (H) 0.44 - 1.00 mg/dL   Calcium 8.2 (L) 8.9 - 10.3 mg/dL   Total Protein 5.0 (L) 6.5 - 8.1 g/dL   Albumin 2.5 (L) 3.5 - 5.0 g/dL   AST 16 15 - 41 U/L   ALT 12 0 - 44 U/L   Alkaline Phosphatase 65 38 - 126 U/L   Total Bilirubin 0.7 0.3 - 1.2 mg/dL   GFR calc non Af Amer 5 (L) >60 mL/min   GFR calc Af Amer 6 (L) >60 mL/min   Anion gap 17 (H) 5 - 15    Comment: Performed at [facility], [place] [address]  [place], [place] [address]  CBC     Status: Abnormal   Collection Time: 03/20/19  3:00 AM  Result Value Ref Range   WBC 10.0 4.0 - 10.5 K/uL   RBC 2.73 (L) 3.87 - 5.11 MIL/uL   Hemoglobin 8.3 (L) 12.0 - 15.0 g/dL   HCT 26.3 (L) 36.0 - 46.0 %   MCV 96.3 80.0 - 100.0 fL   MCH 30.4 26.0 - 34.0 pg   MCHC 31.6 30.0 - 36.0 g/dL   RDW 16.7 (H) 11.5 - 15.5 %   Platelets 310 150 - 400 K/uL   nRBC 0.0 0.0 - 0.2 %    Comment: Performed at [facility], [place] [address], [place], [place] [address]  Renal function panel     Status: Abnormal   Collection Time: 03/21/19 10:29 AM  Result Value Ref Range   Sodium 138 135 - 145 mmol/L   Potassium 3.2 (L) 3.5 - 5.1 mmol/L   Chloride 98 98 - 111 mmol/L   CO2 23 22 - 32 mmol/L   Glucose, Bld 189 (H) 70 - 99 mg/dL   BUN 29 (H) 8 - 23 mg/dL    Comment: DELTA CHECK NOTED DIALYSIS    Creatinine, Ser 3.80 (H) 0.44 - 1.00 mg/dL    Comment: DIALYSIS DELTA CHECK NOTED    Calcium 7.3 (L) 8.9 - 10.3 mg/dL   Phosphorus 6.2 (H) 2.5 - 4.6 mg/dL   Albumin 2.5 (L) 3.5 - 5.0 g/dL   GFR calc non Af Amer 12 (L) >60 mL/min   GFR calc Af Amer 13 (L) >60 mL/min   Anion gap 17 (H) 5 - 15    Comment: Performed at [facility], [place] [address] [place], [place] [address]  CBC with Differential/Platelet     Status: Abnormal   Collection Time: 03/21/19 10:29 AM  Result Value Ref Range   WBC 9.1 4.0 - 10.5 K/uL   RBC 2.59 (L) 3.87 - 5.11 MIL/uL   Hemoglobin 7.7 (L) 12.0 - 15.0 g/dL   HCT 25.2 (L) 36.0 - 46.0 %   MCV 97.3 80.0 - 100.0 fL   MCH 29.7 26.0 - 34.0 pg   MCHC 30.6 30.0 - 36.0 g/dL   RDW 17.0 (H) 11.5 - 15.5 %   Platelets 224 150 - 400 K/uL   nRBC 0.0 0.0 -  0.2 %   Neutrophils Relative % 83 %   Neutro Abs 7.5 1.7 - 7.7 K/uL   Lymphocytes Relative 9 %   Lymphs Abs 0.8 0.7 - 4.0 K/uL   Monocytes Relative 7 %   Monocytes Absolute 0.7 0.1 - 1.0 K/uL   Eosinophils Relative 1 %   Eosinophils Absolute 0.1 0.0 - 0.5 K/uL   Basophils Relative 0 %    Basophils Absolute 0.0 0.0 - 0.1 K/uL   Immature Granulocytes 0 %   Abs Immature Granulocytes 0.04 0.00 - 0.07 K/uL    Comment: Performed at [place] [address], [place], [place] [address]    NM Hepatobiliary Liver Func  Result Date: 03/21/2019 CLINICAL DATA:  Upper abdominal and chest pain approximately 4 days. EXAM: NUCLEAR MEDICINE HEPATOBILIARY IMAGING WITH GALLBLADDER EF TECHNIQUE: Sequential images of the abdomen were obtained out to 60 minutes following intravenous administration of radiopharmaceutical. After oral ingestion of Ensure, gallbladder ejection fraction was determined. At 60 min, normal ejection fraction is greater than 33%. RADIOPHARMACEUTICALS:  4.78 mCi Tc-32m  Choletec IV COMPARISON:  Right upper quadrant ultrasound 03/19/2019. FINDINGS: Prompt uptake and biliary excretion of activity by the liver is seen. Gallbladder activity is visualized, consistent with patency of cystic duct. Biliary activity passes into small bowel, consistent with patent common bile duct. Calculated gallbladder ejection fraction is 67%. (Normal gallbladder ejection fraction with Ensure is greater than 33%.) IMPRESSION: Normal exam. Electronically Signed   By: Inge Rise M.D.   On: 03/21/2019 12:11   CT Angio Chest/Abd/Pel for Dissection W and/or Wo Contrast  Result Date: 03/19/2019 CLINICAL DATA:  69 year old female with history of chest and back pain. Suspected aortic dissection. EXAM: CT ANGIOGRAPHY CHEST, ABDOMEN AND PELVIS TECHNIQUE: Multidetector CT imaging through the chest, abdomen and pelvis was performed using the standard protocol during bolus administration of intravenous contrast. Multiplanar reconstructed images and MIPs were obtained and reviewed to evaluate the vascular anatomy. CONTRAST:  61mL OMNIPAQUE IOHEXOL 350 MG/ML SOLN COMPARISON:  CT the abdomen and pelvis 03/09/2019. CT the abdomen and pelvis 02/27/2019. Chest CT 04/05/2018. FINDINGS: CTA CHEST FINDINGS  Cardiovascular: Noncontrast images demonstrate no crescentic high attenuation associated with the thoracic aorta to suggest acute intramural hematoma. Postcontrast images demonstrate no definite aortic dissection. There is extensive irregular atheromatous plaque throughout the thoracic aorta with multiple areas of ulceration, but no frank dissection or penetration. Heart size is normal. There is no significant pericardial fluid, thickening or pericardial calcification. There is aortic atherosclerosis, as well as atherosclerosis of the great vessels of the mediastinum and the coronary arteries, including calcified atherosclerotic plaque in the left main, left anterior descending, left circumflex and right coronary arteries. Calcifications of the mitral annulus. Right internal jugular PermCath with tip terminating in the right atrium. Mediastinum/Nodes: No pathologically enlarged mediastinal or hilar lymph nodes. Esophagus is unremarkable in appearance. No axillary lymphadenopathy. Lungs/Pleura: A few scattered tiny 2-3 mm pulmonary nodules are noted throughout the lungs bilaterally. No other larger more suspicious appearing pulmonary nodules or masses are noted. Mild diffuse bronchial wall thickening with mild centrilobular and paraseptal emphysema. Linear scarring or subsegmental atelectasis in the dependent portion of the left lower lobe. Musculoskeletal: There are no aggressive appearing lytic or blastic lesions noted in the visualized portions of the skeleton. Review of the MIP images confirms the above findings. CTA ABDOMEN AND PELVIS FINDINGS VASCULAR Aorta: Normal caliber aorta without aneurysm, dissection, vasculitis or significant stenosis. However, there is extensive atheromatous plaque, much of which is  highly irregular with several areas of ulcerative plaque throughout the abdominal aorta. Celiac: Ostium of the vessel is not confidently identified and is either severely stenotic or completely occluded.  However, distal aspects of the vessel and its major branches are patent, potentially via collateral flow. SMA: Patent with mild stenosis proximally (2 mm) followed by poststenotic dilatation (7 mm). Renals: Both renal arteries are patent without evidence of aneurysm, dissection, vasculitis, fibromuscular dysplasia or significant stenosis. IMA: Ostium of the vessel is not confidently identified, potentially completely occluded. Distal aspect of the vessel is patent, presumably fed by collateral pathways. Inflow: Patent without evidence of aneurysm, dissection, vasculitis or significant stenosis. Veins: No obvious venous abnormality within the limitations of this arterial phase study. Review of the MIP images confirms the above findings. NON-VASCULAR Hepatobiliary: No suspicious cystic or solid hepatic lesions. No intra or extrahepatic biliary ductal dilatation. Gallbladder wall appears thickened and edematous (up to 1 cm in thickness). Gallbladder is only mildly distended. No calcified gallstones identified. Pancreas: No pancreatic mass. No pancreatic ductal dilatation. No pancreatic or peripancreatic fluid collections or inflammatory changes. Spleen: Unremarkable. Adrenals/Urinary Tract: 2.8 x 2.4 cm low-attenuation (-2 HU) left adrenal nodule (axial image 155 of series 7), compatible with an adenoma. 2.5 x 1.9 cm right adrenal nodule which demonstrates avid enhancement (axial image 151 of series 7). Multiple calcifications associated with the renal hila bilaterally, favored to be vascular, although nonobstructive calculi are not excluded. Subcentimeter low-attenuation lesions in both kidneys, too small to characterize, but statistically likely to represent cysts. 1.5 cm low-attenuation lesion in the upper pole the left kidney, compatible with a simple cyst. No suspicious renal lesions. Mild bilateral renal atrophy. No hydroureteronephrosis. Urinary bladder is nearly completely decompressed around an indwelling  Foley balloon catheter. Stomach/Bowel: Normal appearance of the stomach. No pathologic dilatation of small bowel or colon. The appendix is not confidently identified and may be surgically absent. Regardless, there are no inflammatory changes noted adjacent to the cecum to suggest the presence of an acute appendicitis at this time. Lymphatic: No lymphadenopathy noted in the abdomen or pelvis. Reproductive: Status post hysterectomy. Ovaries are not confidently identified may be surgically absent or atrophic. Other: No significant volume of ascites.  No pneumoperitoneum. Musculoskeletal: There are no aggressive appearing lytic or blastic lesions noted in the visualized portions of the skeleton. Review of the MIP images confirms the above findings. IMPRESSION: 1. 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 2. Gallbladder wall thickening and edema. No calcified gallstones. Gallbladder is only mildly distended. Overall, findings are equivocal for acute cholecystitis, but further clinical evaluation is recommended. If there is clinical concern for acute cholecystitis, further evaluation with right upper quadrant ultrasound would be recommended. 3. 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 4. Bilateral adrenal nodules, similar to prior studies. The left adrenal nodule is compatible with a benign adenoma. The avidly enhancing right adrenal nodule is incompletely characterized, but appears relatively stable in size compared to numerous prior examinations, suggesting a benign lesion. 5. There is also left main and 3 vessel coronary artery disease. Please note that although the presence of coronary artery calcium documents the presence of coronary artery disease, the severity  of this disease and any potential stenosis cannot be assessed on this non-gated CT examination. Assessment for potential risk factor modification, dietary  therapy or pharmacologic therapy may be warranted, if clinically indicated. 6. Scattered tiny 2-3 mm pulmonary nodules noted in the lungs bilaterally, nonspecific, but statistically likely benign. No follow-up needed if patient is low-risk (and has no known or suspected primary neoplasm). Non-contrast chest CT can be considered in 12 months if patient is high-risk. This recommendation follows the consensus statement: Guidelines for Management of Incidental Pulmonary Nodules Detected on CT Images: From the Fleischner Society 2017; Radiology 2017; 284:228-243. 7. Additional incidental findings, as above. Electronically Signed   By: Vinnie Langton M.D.   On: 03/19/2019 15:55   US Abdomen Limited RUQ  Result Date: 03/19/2019 CLINICAL DATA:  Right upper quadrant pain EXAM: ULTRASOUND ABDOMEN LIMITED RIGHT UPPER QUADRANT COMPARISON:  None. FINDINGS: Gallbladder: No gallstones. Positive sonographic Murphy sign noted by sonographer. Diffuse gallbladder wall thickening with small amount of pericholecystic fluid. Common bile duct: Diameter: 3 mm Liver: No focal lesion identified. Within normal limits in parenchymal echogenicity. Portal vein is patent on color Doppler imaging with normal direction of blood flow towards the liver. Other: None. IMPRESSION: Positive sonographic Murphy sign with gallbladder wall thickening and pericholecystic fluid, but no cholelithiasis. Electronically Signed   By: Ulyses Jarred M.D.   On: 03/19/2019 18:53   Review of Systems  Constitutional: Negative.   HENT: Negative.   Eyes: Negative.   Respiratory: Negative.   Cardiovascular: Negative.   Gastrointestinal: Positive for abdominal pain. Negative for anal bleeding, blood in stool, constipation, diarrhea, nausea, rectal pain and vomiting.  Endocrine: Negative.   Genitourinary:  Negative.   Musculoskeletal: Positive for arthralgias.  Allergic/Immunologic: Negative.   Neurological: Negative.   Hematological: Negative.   Psychiatric/Behavioral: Negative.    Blood pressure (!) 126/48, pulse (!) 57, temperature 98.3 F (36.8 C), resp. rate 19, height 5\' 2"  (1.575 m), weight 43.1 kg, SpO2 100 %. Physical Exam  Constitutional: She is oriented to person, place, and time. She appears well-developed and well-nourished.  HENT:  Head: Normocephalic and atraumatic.  Eyes: Pupils are equal, round, and reactive to light. Conjunctivae and EOM are normal.  Cardiovascular: Normal rate and regular rhythm.  Respiratory: Effort normal and breath sounds normal.  GI: Soft. Bowel sounds are normal. There is no splenomegaly or hepatomegaly. There is no abdominal tenderness. There is no rebound and no CVA tenderness.  scars are noted in the mid abdomen from previous surgery for bowel obstruction  Musculoskeletal:     Cervical back: Normal range of motion and neck supple.  Neurological: She is alert and oriented to person, place, and time.  Skin: Skin is warm and dry.  Psychiatric: She has a normal mood and affect. Her behavior is normal. Judgment and thought content normal.   Assessment/Plan: 1) Epigastric pain radiating to the chest which has resolved since yesterday.  Patient's HIDA scan was normal with a EF of 67% no evidence of acute cholecystitis. Patient had a EGD done last year that was also reportedly normal. Even though her CT scan of the abdomen pelvis is grossly abnormal with severe advanced atherosclerotic disease and stenosis of the celiac axis her collaterals seem to be patent. Her symptoms do not seem to be consistent with mesenteric ischemia.  As per my discussion with Dr. Grandville Silos I have advised the patient to follow-up with her GI doctor in [facility] for further recommendations after discharge. 2) ESRD on HD complicated by medical noncompliance. 3) Advanced  atherosclerotic heart disease on CT scan. 4) Anemia of chronic disease. 5) DNR. Juanita Craver 03/21/2019, 2:17 PM

## 2019-03-22 LAB — CBC WITH DIFFERENTIAL/PLATELET
Abs Immature Granulocytes: 0.06 10*3/uL (ref 0.00–0.07)
Basophils Absolute: 0 10*3/uL (ref 0.0–0.1)
Basophils Relative: 0 %
Eosinophils Absolute: 0.1 10*3/uL (ref 0.0–0.5)
Eosinophils Relative: 1 %
HCT: 25.7 % — ABNORMAL LOW (ref 36.0–46.0)
Hemoglobin: 7.8 g/dL — ABNORMAL LOW (ref 12.0–15.0)
Immature Granulocytes: 1 %
Lymphocytes Relative: 7 %
Lymphs Abs: 0.7 10*3/uL (ref 0.7–4.0)
MCH: 30.5 pg (ref 26.0–34.0)
MCHC: 30.4 g/dL (ref 30.0–36.0)
MCV: 100.4 fL — ABNORMAL HIGH (ref 80.0–100.0)
Monocytes Absolute: 0.8 10*3/uL (ref 0.1–1.0)
Monocytes Relative: 7 %
Neutro Abs: 9.6 10*3/uL — ABNORMAL HIGH (ref 1.7–7.7)
Neutrophils Relative %: 84 %
Platelets: 211 10*3/uL (ref 150–400)
RBC: 2.56 MIL/uL — ABNORMAL LOW (ref 3.87–5.11)
RDW: 16.6 % — ABNORMAL HIGH (ref 11.5–15.5)
WBC: 11.3 10*3/uL — ABNORMAL HIGH (ref 4.0–10.5)
nRBC: 0 % (ref 0.0–0.2)

## 2019-03-22 LAB — BASIC METABOLIC PANEL
Anion gap: 17 — ABNORMAL HIGH (ref 5–15)
BUN: 40 mg/dL — ABNORMAL HIGH (ref 8–23)
CO2: 22 mmol/L (ref 22–32)
Calcium: 7.4 mg/dL — ABNORMAL LOW (ref 8.9–10.3)
Chloride: 100 mmol/L (ref 98–111)
Creatinine, Ser: 5.13 mg/dL — ABNORMAL HIGH (ref 0.44–1.00)
GFR calc Af Amer: 9 mL/min — ABNORMAL LOW (ref >60)
GFR calc non Af Amer: 8 mL/min — ABNORMAL LOW (ref >60)
Glucose, Bld: 230 mg/dL — ABNORMAL HIGH (ref 70–99)
Potassium: 3.3 mmol/L — ABNORMAL LOW (ref 3.5–5.1)
Sodium: 139 mmol/L (ref 135–145)

## 2019-03-22 LAB — MAGNESIUM: Magnesium: 1.4 mg/dL — ABNORMAL LOW (ref 1.7–2.4)

## 2019-03-22 MED ORDER — PANTOPRAZOLE SODIUM 40 MG PO TBEC: 40.0000 mg | DELAYED_RELEASE_TABLET | Freq: Every day | ORAL | Status: DC

## 2019-03-22 MED ORDER — ASPIRIN EC 81 MG PO TBEC: 81.0000 mg | DELAYED_RELEASE_TABLET | Freq: Every day | ORAL | Status: DC

## 2019-03-22 NOTE — Discharge Summary (Signed)
Physician Discharge Summary  Christina [name]:[id number] DOB: [birth year] DOA: 03/19/2019  PCP: Maggie Schwalbe, PA-C  Admit date: 03/19/2019 Discharge date: 03/22/2019  Time spent: 60 minutes  Recommendations for Outpatient Follow-up:  1. Follow-up in hemodialysis center as scheduled on 03/22/2019.  Renal function and electrolytes and magnesium will need to be followed up upon. 2. Follow-up with Nodal, [name], PA-C in 2 weeks. 3. Follow-up with Dr. Lyda Jester, gastroenterology [place] in 1 to 2 weeks for follow-up on abdominal pain.   Discharge Diagnoses:  Principal Problem:   Abdominal pain Active Problems:   ESRD (end stage renal disease) ([place])   Bipolar affective (HCC)   CAD (coronary artery disease), native coronary artery   COPD with chronic bronchitis (HCC)   Afib (HCC)   Non-compliance with renal dialysis ([place])   Chest pain   RUQ pain   Anemia due to chronic kidney disease, on chronic dialysis (HCC)   AF (paroxysmal atrial fibrillation) ([place])   Discharge Condition: Stable and improved  Diet recommendation: Heart healthy  Filed Weights   03/19/19 1343 03/20/19 1245 03/20/19 1655  Weight: 45.4 kg 44.8 kg 43.1 kg    History of present illness:  HPI per Dr. Jana Half is an 69 y.o. female with PMH significant for ESRD on HD with history of multiple missed dialyses, HTN, A. fib, CAD, COPD, history of SBO and depression who presented with abdominal and chest pain.  Patient stated that she has had intermittent chest pain over the past 3 to 4 days which she describes as a "mashing kind of pain" over her central chest and upper abdominal area.  These episodes occur at rest.  They come and go with no known provocation.  No associated shortness of breath.  Patient did admit to nausea and vomiting which she has had for the last couple of days.  Patient also complained of epigastric abdominal pain which she stated comes in waves.  There was  associated nausea.  She had vomited twice since the day prior to admission.  While talking to her patient developed what she described as chest pain which seemed to be spasmodic versus colicky.  When I asked her to describe where it was she pointed to her upper abdominal area.  Patient admitted that she missed her last dialysis.  She did come into dialysis on day of admission, but because of her chest and abdominal pain she was sent to the ED.  Of note patient has had multiple similar admissions with abdominal pain after having missed dialysis.  And in the past abdominal pain resolved with dialysis.  Patient denied fevers or chills.  She did admit to vomitus x2.  Not bloody.  No malaise.  She has been passing gas.  Denies constipation.  Patient admits she is not very active so is not sure whether the chest pain that she is having is associated with exertion or not.  ED Course:  The patient was noted to be hypertensive with no oxygen requirement.  She underwent a CTA chest, abdomen and pelvis which showed multiple abnormalities mostly chronic.  Was noted on CT report that patient has had 4 abdominal CT scans in the past 4 weeks, both here and at [facility]. Notable on the CT though was some gallbladder thickening with edema but only mild distention, no calcified gallstones, equivocal for acute cholecystitis.   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   She is also noted  to have three-vessel coronary artery disease and left main disease.   Nephrology consultation was called and they are planning on dialyzing her today.  General surgery consultation was called and they have yet to see her.  She was started on Zosyn for possible acute cholecystitis.  Hospital Course:  1 epigastric/right upper quadrant abdominal pain Patient presented with epigastric and right upper quadrant abdominal pain associated with meal intake with nausea and vomiting.   Patient did describe some chest pain however in further discussion seemed like pain more epigastric in nature and radiating upwards and downwards with also a burning sensation to it.  EKG normal.  Initial troponin not elevated.  Right upper quadrant ultrasound done consistent with acute cholecystitis.  CT angiogram chest abdomen and pelvis with extensive severe atherosclerosis throughout thoracoabdominal aorta with no evidence to suggest acute aortic syndrome, gall bladder wall thickening and edema, no calcified stones, mildly distended gallbladder, overall findings equivocal for acute cholecystitis but further clinical evaluation recommended.  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  Left main and three-vessel coronary artery disease noted.  Patient seen in consultation by general surgery who recommended a HIDA scan for further evaluation.  Patient with T-max of 100.4 the evening of 03/20/2019 and subsequently remained afebrile throughout the rest of the hospitalization.  Leukocytosis trended down. HIDA scan negative.  Patient was placed empirically on IV Rocephin and IV Flagyl as well as a PPI. Patient improved clinically however patient does still have right upper quadrant tenderness to palpation as well as epigastric tenderness to palpation.  Patient was placed on a diet which he tolerated.  General surgery and GI consulted. Per general surgery if HIDA scan is negative will need GI evaluation to rule out peptic ulcer disease versus intestinal angina.  GI was consulted and assessed the patient  and felt patient symptoms were not consistent with mesenteric ischemia.  As patient symptoms had improved clinically GI recommended outpatient follow-up with a gastroenterologist in [place] for further recommendations.  General surgery recommended no further antibiotics on  discharge.  Patient did receive 3 days of IV antibiotics.  Patient will be discharged in stable and improved condition on Protonix 40 mg daily with outpatient follow-up with her gastroenterologist.   2.  End-stage renal disease with medical noncompliance Patient noted that last hemodialysis was 5 days prior to admission.  Patient stated missed other hemodialysis sessions due to significant pain and inability to get out of her home.  Patient seen in consultation by nephrology and patient s/p hemodialysis 03/20/2019.  Patient will follow up in her outpatient hemodialysis on day of discharge 03/22/2019.   3.  Hyperkalemia Patient underwent hemodialysis on 03/20/2019.    Patient also placed on Lokelma.  Patient was followed by nephrology throughout the hospitalization.  Patient will follow up with nephrology and hemodialysis.  4.  Hypertension Blood pressure remained stable during the hospitalization.  Patient maintained on home regimen of Lopressor and Imdur.  Outpatient follow-up.   5.  History of paroxysmal atrial fibrillation Remained in sinus rhythm during the hospitalization.  Lopressor was continued for rate control. Patient not on anticoagulation as an outpatient likely secondary to history of noncompliance.    6.  Anemia of chronic disease Patient with no overt bleeding.  Likely anemia of chronic disease.  Hemoglobin remained stable at 7.8.  Anemia panel had a iron level of 21, TIBC of 196,  ferritin of 572, folate of 6.1, vitamin B12 of 524.  Outpatient follow-up with nephrology.   7.  History of coronary artery disease Patient presenting with epigastric and right upper quadrant pain radiating to the chest which she also described as a chest pain.  Pain likely more GI related/secondary to probable acute cholecystitis.  CT angiogram chest abdomen and pelvis did show left main disease and coronary artery disease however patient's presentation more secondary to probable acute cholecystitis.   EKG in normal sinus rhythm.  Troponins slightly elevated but flattened.  Patient with recent 2D echo 01/25/2019 with a EF of 45 to 72%, grade 1 diastolic dysfunction, global hypokinesis of the left ventricle.  Patient maintained on home regimen of cardiac medications of Lopressor, Imdur, Pravachol, aspirin.  Outpatient follow-up with cardiology.   Procedures:  Right upper quadrant ultrasound 03/19/2019  CT angiogram chest abdomen and pelvis 03/19/2019  HIDA scan 03/21/2019  Consultations:  General surgery: Dr. Redmond Pulling 03/19/2019  Nephrology: Dr. Marval Regal 03/19/2019  Gastroenterology: Dr. Collene Mares 03/21/2019  Discharge Exam: Vitals:   03/22/19 0728 03/22/19 0822  BP:  (!) 160/60  Pulse:  (!) 58  Resp:  20  Temp:  98.2 F (36.8 C)  SpO2: 97% 100%    General: NAD Cardiovascular: Regular rate rhythm with 3/6 systolic ejection murmur.  No wheezes, no crackles, no rhonchi. Respiratory: Clear to auscultation bilaterally.  Discharge Instructions   Discharge Instructions    Diet - low sodium heart healthy   Complete by: As directed    Increase activity slowly   Complete by: As directed      Allergies as of 03/22/2019      Reactions   Cyclobenzaprine Anaphylaxis, Other (See Comments)   "stopped heart"   Morphine Anaphylaxis   "stopped heart"   Penicillins Shortness Of Breath, Swelling, Palpitations   Did it involve swelling of the face/tongue/throat, SOB, or low BP? Yes Did it involve sudden or severe rash/hives, skin peeling, or any reaction on the inside of your mouth or nose? No Did you need to seek medical attention at a hospital or doctor's office? No When did it last happen?years  If all above answers are "NO", may proceed with cephalosporin use.   Ambien [zolpidem] Other (See Comments)   Severe confusion   Codeine Itching, Rash   Hydromorphone Other (See Comments)   If administered quickly, felt like hand was "exploding"       Medication List    TAKE these  medications   albuterol (2.5 MG/3ML) 0.083% nebulizer solution Commonly known as: PROVENTIL Take 2.5 mg by nebulization every 6 (six) hours as needed for wheezing or shortness of breath.   allopurinol 100 MG tablet Commonly known as: ZYLOPRIM Take 100 mg by mouth daily.   amLODipine 10 MG tablet Commonly known as: NORVASC Take 10 mg by mouth daily.   aspirin EC 81 MG tablet Take 1 tablet (81 mg total) by mouth daily.   Auryxia 1 GM 210 MG(Fe) tablet Generic drug: ferric citrate Take 420-630 mg by mouth See admin instructions. Take 3 tablets (630 mg) by mouth three times daily with meals and 2 tablets (420 mg) with snacks   budesonide-formoterol 160-4.5 MCG/ACT inhaler Commonly known as: SYMBICORT Inhale 2 puffs into the lungs every 4 (four) hours as needed (shortness of breath/wheezing).   dicyclomine 10 MG capsule Commonly known as: BENTYL Take 20 mg by mouth at bedtime.   docusate sodium 100 MG capsule Commonly known as: COLACE Take 1 capsule (100 mg total)  by mouth 2 (two) times daily.   doxylamine (Sleep) 25 MG tablet Commonly known as: UNISOM Take 25 mg by mouth at bedtime as needed for sleep.   furosemide 40 MG tablet Commonly known as: LASIX 40 mg lasix on non-HD days What changed:   how much to take  how to take this  when to take this  additional instructions   gabapentin 300 MG capsule Commonly known as: NEURONTIN Take 1 capsule (300 mg total) by mouth at bedtime.   Ipratropium-Albuterol 20-100 MCG/ACT Aers respimat Commonly known as: COMBIVENT Inhale 2 puffs into the lungs 4 (four) times daily as needed for wheezing.   isosorbide mononitrate 30 MG 24 hr tablet Commonly known as: IMDUR Take 30 mg by mouth daily.   lidocaine-prilocaine cream Commonly known as: EMLA Apply 1 application topically daily as needed (1-2 hours before dialysis (cover with occlusive dressing)).   LUBRICATING EYE DROPS OP Place 1 drop into both eyes daily as needed  (dry eyes).   metoprolol tartrate 100 MG tablet Commonly known as: LOPRESSOR Take 1 tablet (100 mg total) by mouth 2 (two) times daily.   nitroGLYCERIN 0.4 MG SL tablet Commonly known as: NITROSTAT Place 0.4 mg under the tongue every 5 (five) minutes as needed for chest pain.   ondansetron 4 MG tablet Commonly known as: ZOFRAN Take 4 mg by mouth 3 (three) times daily as needed for nausea.   pantoprazole 40 MG tablet Commonly known as: PROTONIX Take 1 tablet (40 mg total) by mouth daily. What changed:   when to take this  reasons to take this   polyethylene glycol 17 g packet Commonly known as: MIRALAX / GLYCOLAX Take 17 g by mouth 2 (two) times daily. What changed: when to take this   pravastatin 80 MG tablet Commonly known as: PRAVACHOL Take 80 mg by mouth every evening.   sennosides-docusate sodium 8.6-50 MG tablet Commonly known as: SENOKOT-S Take 1 tablet by mouth 2 (two) times daily.   Vitamin D3 50 MCG (2000 UT) capsule Take 2,000 Units by mouth daily.      Allergies  Allergen Reactions  . Cyclobenzaprine Anaphylaxis and Other (See Comments)    "stopped heart"   . Morphine Anaphylaxis    "stopped heart"  . Penicillins Shortness Of Breath, Swelling and Palpitations    Did it involve swelling of the face/tongue/throat, SOB, or low BP? Yes Did it involve sudden or severe rash/hives, skin peeling, or any reaction on the inside of your mouth or nose? No Did you need to seek medical attention at a hospital or doctor's office? No When did it last happen?years  If all above answers are "NO", may proceed with cephalosporin use.   . Ambien [Zolpidem] Other (See Comments)    Severe confusion  . Codeine Itching and Rash  . Hydromorphone Other (See Comments)    If administered quickly, felt like hand was "exploding"    Follow-up Information    Nodal, [name], PA-C. Schedule an appointment as soon as possible for a visit in 2 week(s).   Specialty:  Physician Assistant Contact information: [address] [place] [address] [phone]        HD Follow up on 03/22/2019.   Why: f/u up in HD today.       Misenheimer, Christia Reading, MD. Schedule an appointment as soon as possible for a visit in 1 week(s).   Specialty: Unknown Physician Specialty Why: f/u with gastroenterologist in 1-2 weeks. Contact information: [place]  [place] [address] [phone]            The results of significant diagnostics from this hospitalization (including imaging, microbiology, ancillary and laboratory) are listed below for reference.    Significant Diagnostic Studies: CT ABDOMEN PELVIS WO CONTRAST  Result Date: 02/27/2019 CLINICAL DATA:  Abdominal distension.  Pain.  Vomiting and diarrhea. EXAM: CT ABDOMEN AND PELVIS WITHOUT CONTRAST TECHNIQUE: Multidetector CT imaging of the abdomen and pelvis was performed following the standard protocol without IV contrast. COMPARISON:  February 21, 2019 FINDINGS: Lower chest: Atelectasis is seen in the left lung base. Lung bases otherwise unremarkable. No other changes in the lower chest. Hepatobiliary: The liver is unremarkable. The gallbladder is poorly distended. There is fat stranding and a small amount of fluid in the right upper quadrant which is thought to arise from the duodenum, described below. This stranding in fluid does abut the gallbladder but the gallbladder is not thought to be the cause. Pancreas: Unremarkable. No pancreatic ductal dilatation or surrounding inflammatory changes. Spleen: Normal in size without focal abnormality. Adrenals/Urinary Tract: Again noted is a left adrenal adenoma measuring 2.6 cm. The right adrenal nodule measuring up to 2.2 cm is indeterminate on this study but has been stable since 2012 consistent with a benign etiology such as an adenoma. Bilateral renal cysts are identified. There is an indeterminate mass exophytic off the posterior left kidney on  axial image 25 measuring up to 8 mm, not present on the February 21, 2019 study. Right-sided renal cysts are seen as well. Vascular calcifications are associated with both kidneys. No definite renal calculi. No hydronephrosis. No ureteral stones or ureterectasis. The bladder is normal. Stomach/Bowel: The stomach is normal. The duodenum is thickened with adjacent fat stranding, involving the second proximal third portions of the duodenum. No extraluminal gas identified. No obvious ulceration. 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 Remainder of the small bowel is unchanged. No small bowel obstruction identified. Moderate fecal loading in the colon. The colon is otherwise unremarkable. The appendix is not seen consistent previous appendectomy. Vascular/Lymphatic: Dense atherosclerosis is seen in the nonaneurysmal tortuous abdominal aorta. Atherosclerotic change extends into the iliac and femoral vessels. No adenopathy. Reproductive: Status post hysterectomy. No adnexal masses. Other: No abdominal wall hernia or abnormality. No abdominopelvic ascites. Musculoskeletal: No acute or significant osseous findings. IMPRESSION: 1. The duodenum is thick walled with adjacent fat stranding and a small amount of fluid. The findings are consistent with duodenal inflammation which is often seen with peptic ulcer disease. Infectious and other inflammatory causes are other possibilities. Recommend clinical correlation. No extraluminal gas or ulceration is seen on this study. 2. There is an indeterminate 8 mm mass exophytic off the left kidney, not present February 21, 2019. Neoplasm is unlikely given the short interval and a hemorrhagic or complicated cyst is most likely. An MRI could further assess as an outpatient. 3. Adrenal adenomas, unchanged. 4. Moderate fecal loading in the colon. 5. Significant atherosclerotic changes in the aorta, iliac vessels, and femoral vessels. Electronically  Signed   By: Dorise Bullion III M.D   On: 02/27/2019 14:54   CT ABDOMEN PELVIS WO CONTRAST  Result Date: 02/21/2019 CLINICAL DATA:  Abdominal pain with nausea and vomiting. History of multiple small bowel obstructions. EXAM: CT ABDOMEN AND PELVIS WITHOUT CONTRAST TECHNIQUE: Multidetector CT imaging of the abdomen and pelvis was performed following the standard protocol without IV contrast. COMPARISON:  CT abdomen pelvis dated September 15, 2018.  FINDINGS: Lower chest: No acute abnormality. Unchanged mild scarring at the lung bases. Hepatobiliary: No focal liver abnormality is seen. No gallstones, gallbladder wall thickening, or biliary dilatation. Pancreas: Unremarkable. No pancreatic ductal dilatation or surrounding inflammatory changes. Spleen: Normal in size without focal abnormality. Adrenals/Urinary Tract: Unchanged bilateral adrenal adenomas measuring 2.5 cm on the left and 2.2 cm on the right. Unchanged bilateral renal atrophy, small cysts, and right greater than left renovascular calcifications. No definite renal calculi. No hydronephrosis. The bladder is unremarkable. Stomach/Bowel: Stomach is within normal limits. Prior appendectomy. Unchanged small bowel anastomosis in the left abdomen. No evidence of bowel wall thickening, distention, or inflammatory changes. Oral contrast reaches the sigmoid colon. Unchanged duodenal diverticulum. Vascular/Lymphatic: Aortic atherosclerosis. No enlarged abdominal or pelvic lymph nodes. Reproductive: Status post hysterectomy. No adnexal masses. Other: No free fluid or pneumoperitoneum. Unchanged small fat containing ventral supraumbilical hernias. Musculoskeletal: No acute or significant osseous findings. IMPRESSION: 1. No acute intra-abdominal process. 2. Unchanged bilateral adrenal adenomas. 3.  Aortic atherosclerosis (ICD10-I70.0). Electronically Signed   By: Titus Dubin M.D.   On: 02/21/2019 11:40   NM Hepatobiliary Liver Func  Result Date:  03/21/2019 CLINICAL DATA:  Upper abdominal and chest pain approximately 4 days. EXAM: NUCLEAR MEDICINE HEPATOBILIARY IMAGING WITH GALLBLADDER EF TECHNIQUE: Sequential images of the abdomen were obtained out to 60 minutes following intravenous administration of radiopharmaceutical. After oral ingestion of Ensure, gallbladder ejection fraction was determined. At 60 min, normal ejection fraction is greater than 33%. RADIOPHARMACEUTICALS:  4.78 mCi Tc-57m  Choletec IV COMPARISON:  Right upper quadrant ultrasound 03/19/2019. FINDINGS: Prompt uptake and biliary excretion of activity by the liver is seen. Gallbladder activity is visualized, consistent with patency of cystic duct. Biliary activity passes into small bowel, consistent with patent common bile duct. Calculated gallbladder ejection fraction is 67%. (Normal gallbladder ejection fraction with Ensure is greater than 33%.) IMPRESSION: Normal exam. Electronically Signed   By: Inge Rise M.D.   On: 03/21/2019 12:11   DG Chest Port 1 View  Result Date: 03/19/2019 CLINICAL DATA:  Chest pain since last night with shortness of breath. Missed dialysis today and this past Tuesday. EXAM: PORTABLE CHEST 1 VIEW COMPARISON:  03/09/2019 FINDINGS: Right IJ dialysis catheter unchanged. Lungs are hyperexpanded without lobar consolidation or effusion. Subtle hazy prominence of the central pulmonary vessels likely minimal vascular congestion. Cardiomediastinal silhouette and remainder of the exam is unchanged. IMPRESSION: Suggestion of minimal vascular congestion. Electronically Signed   By: Marin Olp M.D.   On: 03/19/2019 13:41   DG Chest Portable 1 View  Result Date: 02/21/2019 CLINICAL DATA:  Tachycardia EXAM: PORTABLE CHEST 1 VIEW COMPARISON:  February 05, 2019 FINDINGS: Endotracheal tube and nasogastric tube have been removed. Central catheter tip is in the superior vena cava near the cavoatrial junction, unchanged. There is mild atelectatic change in the left  mid lung and left base. Lungs elsewhere are clear. Heart is upper normal in size with pulmonary vascularity normal. No adenopathy. There is aortic atherosclerosis. No bone lesions. IMPRESSION: Areas of mild atelectatic change on the left. Lungs elsewhere clear. Stable cardiac silhouette. No adenopathy. Central catheter tip in superior vena cava near cavoatrial junction, stable. Aortic Atherosclerosis (ICD10-I70.0). Electronically Signed   By: Lowella Grip III M.D.   On: 02/21/2019 08:10   DG Abd Portable 1 View  Result Date: 03/18/2019 CLINICAL DATA:  Question small bowel obstruction. Clinical notes states abdominal pain. Vomiting. EXAM: PORTABLE ABDOMEN - 1 VIEW COMPARISON:  [facility] 03/09/2019 at [facility] ,  9 days prior. Patient is also had abdominal CT 03/06/2019, 02/27/2019, and 02/21/2019. FINDINGS: There is contrast within the descending and sigmoid colon from prior radiologic exam. Small volume of stool in the ascending colon. Air-filled prominent but nondilated small bowel in the left abdomen. No abnormal gastric distension. Vascular calcifications. Renal calcifications on CT not well visualized radiographically. No evidence of free air. No acute osseous abnormalities are seen. IMPRESSION: No evidence of small-bowel obstruction or acute findings. There is enteric contrast within the descending and sigmoid colon from prior radiologic exam. Patient has had 4 abdominal CTs in the last 4 weeks, most recent CT was 03/09/2019 at [facility]. Electronically Signed   By: Keith Rake M.D.   On: 03/18/2019 03:07   CT Angio Chest/Abd/Pel for Dissection W and/or Wo Contrast  Result Date: 03/19/2019 CLINICAL DATA:  68 year old female with history of chest and back pain. Suspected aortic dissection. EXAM: CT ANGIOGRAPHY CHEST, ABDOMEN AND PELVIS TECHNIQUE: Multidetector CT imaging through the chest, abdomen and pelvis was performed using the standard protocol during bolus administration of  intravenous contrast. Multiplanar reconstructed images and MIPs were obtained and reviewed to evaluate the vascular anatomy. CONTRAST:  26mL OMNIPAQUE IOHEXOL 350 MG/ML SOLN COMPARISON:  CT the abdomen and pelvis 03/09/2019. CT the abdomen and pelvis 02/27/2019. Chest CT 04/05/2018. FINDINGS: CTA CHEST FINDINGS Cardiovascular: Noncontrast images demonstrate no crescentic high attenuation associated with the thoracic aorta to suggest acute intramural hematoma. Postcontrast images demonstrate no definite aortic dissection. There is extensive irregular atheromatous plaque throughout the thoracic aorta with multiple areas of ulceration, but no frank dissection or penetration. Heart size is normal. There is no significant pericardial fluid, thickening or pericardial calcification. There is aortic atherosclerosis, as well as atherosclerosis of the great vessels of the mediastinum and the coronary arteries, including calcified atherosclerotic plaque in the left main, left anterior descending, left circumflex and right coronary arteries. Calcifications of the mitral annulus. Right internal jugular PermCath with tip terminating in the right atrium. Mediastinum/Nodes: No pathologically enlarged mediastinal or hilar lymph nodes. Esophagus is unremarkable in appearance. No axillary lymphadenopathy. Lungs/Pleura: A few scattered tiny 2-3 mm pulmonary nodules are noted throughout the lungs bilaterally. No other larger more suspicious appearing pulmonary nodules or masses are noted. Mild diffuse bronchial wall thickening with mild centrilobular and paraseptal emphysema. Linear scarring or subsegmental atelectasis in the dependent portion of the left lower lobe. Musculoskeletal: There are no aggressive appearing lytic or blastic lesions noted in the visualized portions of the skeleton. Review of the MIP images confirms the above findings. CTA ABDOMEN AND PELVIS FINDINGS VASCULAR Aorta: Normal caliber aorta without aneurysm,  dissection, vasculitis or significant stenosis. However, there is extensive atheromatous plaque, much of which is highly irregular with several areas of ulcerative plaque throughout the abdominal aorta. Celiac: Ostium of the vessel is not confidently identified and is either severely stenotic or completely occluded. However, distal aspects of the vessel and its major branches are patent, potentially via collateral flow. SMA: Patent with mild stenosis proximally (2 mm) followed by poststenotic dilatation (7 mm). Renals: Both renal arteries are patent without evidence of aneurysm, dissection, vasculitis, fibromuscular dysplasia or significant stenosis. IMA: Ostium of the vessel is not confidently identified, potentially completely occluded. Distal aspect of the vessel is patent, presumably fed by collateral pathways. Inflow: Patent without evidence of aneurysm, dissection, vasculitis or significant stenosis. Veins: No obvious venous abnormality within the limitations of this arterial phase study. Review of the MIP images confirms the above findings. NON-VASCULAR Hepatobiliary:  No suspicious cystic or solid hepatic lesions. No intra or extrahepatic biliary ductal dilatation. Gallbladder wall appears thickened and edematous (up to 1 cm in thickness). Gallbladder is only mildly distended. No calcified gallstones identified. Pancreas: No pancreatic mass. No pancreatic ductal dilatation. No pancreatic or peripancreatic fluid collections or inflammatory changes. Spleen: Unremarkable. Adrenals/Urinary Tract: 2.8 x 2.4 cm low-attenuation (-2 HU) left adrenal nodule (axial image 155 of series 7), compatible with an adenoma. 2.5 x 1.9 cm right adrenal nodule which demonstrates avid enhancement (axial image 151 of series 7). Multiple calcifications associated with the renal hila bilaterally, favored to be vascular, although nonobstructive calculi are not excluded. Subcentimeter low-attenuation lesions in both kidneys, too small  to characterize, but statistically likely to represent cysts. 1.5 cm low-attenuation lesion in the upper pole the left kidney, compatible with a simple cyst. No suspicious renal lesions. Mild bilateral renal atrophy. No hydroureteronephrosis. Urinary bladder is nearly completely decompressed around an indwelling Foley balloon catheter. Stomach/Bowel: Normal appearance of the stomach. No pathologic dilatation of small bowel or colon. The appendix is not confidently identified and may be surgically absent. Regardless, there are no inflammatory changes noted adjacent to the cecum to suggest the presence of an acute appendicitis at this time. Lymphatic: No lymphadenopathy noted in the abdomen or pelvis. Reproductive: Status post hysterectomy. Ovaries are not confidently identified may be surgically absent or atrophic. Other: No significant volume of ascites.  No pneumoperitoneum. Musculoskeletal: There are no aggressive appearing lytic or blastic lesions noted in the visualized portions of the skeleton. Review of the MIP images confirms the above findings. IMPRESSION: 1. 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 2. Gallbladder wall thickening and edema. No calcified gallstones. Gallbladder is only mildly distended. Overall, findings are equivocal for acute cholecystitis, but further clinical evaluation is recommended. If there is clinical concern for acute cholecystitis, further evaluation with right upper quadrant ultrasound would be recommended. 3. 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 4. Bilateral adrenal nodules, similar to prior studies. The left adrenal nodule is compatible with a benign adenoma. The avidly enhancing right adrenal nodule is incompletely  characterized, but appears relatively stable in size compared to numerous prior examinations, suggesting a benign lesion. 5. There is also left main and 3 vessel coronary artery disease. Please note that although the presence of coronary artery calcium documents the presence of coronary artery disease, the severity of this disease and any potential stenosis cannot be assessed on this non-gated CT examination. Assessment for potential risk factor modification, dietary therapy or pharmacologic therapy may be warranted, if clinically indicated. 6. Scattered tiny 2-3 mm pulmonary nodules noted in the lungs bilaterally, nonspecific, but statistically likely benign. No follow-up needed if patient is low-risk (and has no known or suspected primary neoplasm). Non-contrast chest CT can be considered in 12 months if patient is high-risk. This recommendation follows the consensus statement: Guidelines for Management of Incidental Pulmonary Nodules Detected on CT Images: From the Fleischner Society 2017; Radiology 2017; [id number]. 7. Additional incidental findings, as above. Electronically Signed   By: Vinnie Langton M.D.   On: 03/19/2019 15:55   US Abdomen Limited RUQ  Result Date: 03/19/2019 CLINICAL DATA:  Right upper quadrant pain EXAM: ULTRASOUND ABDOMEN LIMITED RIGHT UPPER QUADRANT COMPARISON:  None. FINDINGS: Gallbladder: No gallstones. Positive sonographic Murphy sign noted  by sonographer. Diffuse gallbladder wall thickening with small amount of pericholecystic fluid. Common bile duct: Diameter: 3 mm Liver: No focal lesion identified. Within normal limits in parenchymal echogenicity. Portal vein is patent on color Doppler imaging with normal direction of blood flow towards the liver. Other: None. IMPRESSION: Positive sonographic Murphy sign with gallbladder wall thickening and pericholecystic fluid, but no cholelithiasis. Electronically Signed   By: Ulyses Jarred M.D.   On: 03/19/2019 18:53     Microbiology: Recent Results (from the past 240 hour(s))  Respiratory Panel by RT PCR (Flu A&B, Covid) - Nasopharyngeal Swab     Status: None   Collection Time: 03/19/19  7:40 PM   Specimen: Nasopharyngeal Swab  Result Value Ref Range Status   SARS Coronavirus 2 by RT PCR NEGATIVE NEGATIVE Final    Comment: (NOTE) SARS-CoV-2 target nucleic acids are NOT DETECTED. The SARS-CoV-2 RNA is generally detectable in upper respiratoy specimens during the acute phase of infection. The lowest concentration of SARS-CoV-2 viral copies this assay can detect is 131 copies/mL. A negative result does not preclude SARS-Cov-2 infection and should not be used as the sole basis for treatment or other patient management decisions. A negative result may occur with  improper specimen collection/handling, submission of specimen other than nasopharyngeal swab, presence of viral mutation(s) within the areas targeted by this assay, and inadequate number of viral copies (<131 copies/mL). A negative result must be combined with clinical observations, patient history, and epidemiological information. The expected result is Negative. Fact Sheet for Patients:  PinkCheek.be Fact Sheet for Healthcare Providers:  GravelBags.it This test is not yet ap proved or cleared by the [place] FDA and  has been authorized for detection and/or diagnosis of SARS-CoV-2 by FDA under an Emergency Use Authorization (EUA). This EUA will remain  in effect (meaning this test can be used) for the duration of the COVID-19 declaration under Section 564(b)(1) of the Act, 21 U.S.C. section 360bbb-3(b)(1), unless the authorization is terminated or revoked sooner.    Influenza A by PCR NEGATIVE NEGATIVE Final   Influenza B by PCR NEGATIVE NEGATIVE Final    Comment: (NOTE) The Xpert Xpress SARS-CoV-2/FLU/RSV assay is intended as an aid in  the diagnosis of influenza from  Nasopharyngeal swab specimens and  should not be used as a sole basis for treatment. Nasal washings and  aspirates are unacceptable for Xpert Xpress SARS-CoV-2/FLU/RSV  testing. Fact Sheet for Patients: PinkCheek.be Fact Sheet for Healthcare Providers: GravelBags.it This test is not yet approved or cleared by the [place] FDA and  has been authorized for detection and/or diagnosis of SARS-CoV-2 by  FDA under an Emergency Use Authorization (EUA). This EUA will remain  in effect (meaning this test can be used) for the duration of the  Covid-19 declaration under Section 564(b)(1) of the Act, 21  U.S.C. section 360bbb-3(b)(1), unless the authorization is  terminated or revoked. Performed at [facility], [place] [address] [place], [place] [address]      Labs: Basic Metabolic Panel: Recent Labs  Lab 03/18/19 0141 03/18/19 0141 03/18/19 0224 03/19/19 1400 03/20/19 0300 03/21/19 1029 03/22/19 0802  NA 131*   < > 130* 134* 133* 138 139  K 4.7   < > 4.8 5.3* 5.4* 3.2* 3.3*  CL 92*   < > 95* 94* 97* 98 100  CO2 21*  --   --  19* 19* 23 22  GLUCOSE 145*   < > 139* 63* 92 189* 230*  BUN 68*   < >  65* 89* 100* 29* 40*  CREATININE 6.65*   < > 7.50* 7.17* 7.30* 3.80* 5.13*  CALCIUM 7.7*  --   --  8.5* 8.2* 7.3* 7.4*  MG  --   --   --   --   --   --  1.4*  PHOS  --   --   --   --   --  6.2*  --    < > = values in this interval not displayed.   Liver Function Tests: Recent Labs  Lab 03/18/19 0141 03/19/19 1400 03/20/19 0300 03/21/19 1029  AST 19 24 16   --   ALT 16 17 12   --   ALKPHOS 65 85 65  --   BILITOT 0.6 0.6 0.7  --   PROT 5.9* 6.5 5.0*  --   ALBUMIN 3.1* 3.3* 2.5* 2.5*   Recent Labs  Lab 03/18/19 0141 03/19/19 1400  LIPASE 41 49   No results for input(s): AMMONIA in the last 168 hours. CBC: Recent Labs  Lab 03/18/19 0141 03/18/19 0141 03/18/19 0224 03/19/19 1400 03/20/19 0300 03/21/19 1029  03/22/19 0802  WBC 10.1  --   --  13.0* 10.0 9.1 11.3*  NEUTROABS 9.0*  --   --   --   --  7.5 9.6*  HGB 8.1*   < > 9.2* 9.6* 8.3* 7.7* 7.8*  HCT 25.6*   < > 27.0* 32.2* 26.3* 25.2* 25.7*  MCV 96.6  --   --  101.9* 96.3 97.3 100.4*  PLT 330  --   --  330 310 224 211   < > = values in this interval not displayed.   Cardiac Enzymes: No results for input(s): CKTOTAL, CKMB, CKMBINDEX, TROPONINI in the last 168 hours. BNP: BNP (last 3 results) Recent Labs    01/24/19 2228 02/04/19 0103  BNP 1,570.0* 4,197.2*    ProBNP (last 3 results) No results for input(s): PROBNP in the last 8760 hours.  CBG: No results for input(s): GLUCAP in the last 168 hours.     Signed:  Irine Seal MD.  [facility] 03/22/2019, 9:24 AM

## 2019-03-22 NOTE — Progress Notes (Signed)
Discharge instructions reviewed with patient, all questions answered and understanding was verbalized.  IV was removed, tele monitor discontinued. Patient discharged per RN via wheelchair to personal vehicle.

## 2019-03-22 NOTE — Progress Notes (Signed)
Patient cleared for discharge and will go straight to her OP HD clinic/[place] today for treatment. She states her sister will pick her up and take her to her clinic. Clinic aware that patient may be late due to hospital discharge. She needs to arrive by noon to get a full treatment. Patient aware and agreeable to plan. Renal PA/K. Stovall to send orders to OP HD clinic.  Alphonzo Cruise, [place] [facility]al Navigator [phone]

## 2019-03-22 NOTE — Final Consult Note (Signed)
Consultant Final Sign-Off Note    Assessment/Final recommendations  Christina [name] is a 69 y.o. female followed by me for abdominal pain  Patient was seen for concerns of acalculous cholecystitis. HIDA noted to be negative. GI has evaluated the patient and recommended patient to follow-up with her GI doctor in [place] for further recommendations after discharge.  Wound care (if applicable):    Diet at discharge: per primary team   Activity at discharge: per primary team   Follow-up appointment:  As needed   Pending results:  Unresulted Labs (From admission, onward)    Start     Ordered   03/22/19 3159  Basic metabolic panel  Once,   R    Question:  Specimen collection method  Answer:  Lab=Lab collect   03/22/19 0747   03/22/19 0748  CBC with Differential/Platelet  Once,   R    Question:  Specimen collection method  Answer:  Lab=Lab collect   03/22/19 0747   03/22/19 0748  Magnesium  Once,   R    Question:  Specimen collection method  Answer:  Lab=Lab collect   03/22/19 0747   Signed and Held  Renal function panel  Once,   R     Signed and Held   Signed and Held  CBC  Once,   R     Signed and Held           Medication recommendations:    Other recommendations:    Thank you for allowing [place] to participate in the care of your patient!  Please consult [place] again if you have further needs for your patient.  Barth Kirks [facility] 03/22/2019 8:03 AM    Subjective   Doing well. She reports her abdominal pain has resolved except for with palpation. She is tolerating a renal diet without any abdominal pain, n/v.   Objective  Vital signs in last 24 hours: Temp:  [98.3 F (36.8 C)-98.4 F (36.9 C)] 98.4 F (36.9 C) (02/22 2338) Pulse Rate:  [57-62] 62 (02/22 2338) Resp:  [19] 19 (02/22 1540) BP: (126-163)/(48-59) 133/59 (02/22 2338) SpO2:  [97 %-100 %] 97 % (02/23 0728)  Gen:  Alert, NAD, pleasant Lungs: Normal rate and effort  Abd: Soft, ND, mild epigastric  tenderness, +BS. Prior abdominal scars noted and well healed  Skin: no rashes noted, warm and dry  Pertinent labs and Studies: Recent Labs    03/19/19 1400 03/20/19 0300 03/21/19 1029  WBC 13.0* 10.0 9.1  HGB 9.6* 8.3* 7.7*  HCT 32.2* 26.3* 25.2*   BMET Recent Labs    03/20/19 0300 03/21/19 1029  NA 133* 138  K 5.4* 3.2*  CL 97* 98  CO2 19* 23  GLUCOSE 92 189*  BUN 100* 29*  CREATININE 7.30* 3.80*  CALCIUM 8.2* 7.3*   No results for input(s): LABURIN in the last 72 hours. Results for orders placed or performed during the hospital encounter of 03/19/19  Respiratory Panel by RT PCR (Flu A&B, Covid) - Nasopharyngeal Swab     Status: None   Collection Time: 03/19/19  7:40 PM   Specimen: Nasopharyngeal Swab  Result Value Ref Range Status   SARS Coronavirus 2 by RT PCR NEGATIVE NEGATIVE Final    Comment: (NOTE) SARS-CoV-2 target nucleic acids are NOT DETECTED. The SARS-CoV-2 RNA is generally detectable in upper respiratoy specimens during the acute phase of infection. The lowest concentration of SARS-CoV-2 viral copies this assay can detect is 131 copies/mL. A negative result does not preclude  SARS-Cov-2 infection and should not be used as the sole basis for treatment or other patient management decisions. A negative result may occur with  improper specimen collection/handling, submission of specimen other than nasopharyngeal swab, presence of viral mutation(s) within the areas targeted by this assay, and inadequate number of viral copies (<131 copies/mL). A negative result must be combined with clinical observations, patient history, and epidemiological information. The expected result is Negative. Fact Sheet for Patients:  PinkCheek.be Fact Sheet for Healthcare Providers:  GravelBags.it This test is not yet ap proved or cleared by the [place] FDA and  has been authorized for detection and/or diagnosis of  SARS-CoV-2 by FDA under an Emergency Use Authorization (EUA). This EUA will remain  in effect (meaning this test can be used) for the duration of the COVID-19 declaration under Section 564(b)(1) of the Act, 21 U.S.C. section 360bbb-3(b)(1), unless the authorization is terminated or revoked sooner.    Influenza A by PCR NEGATIVE NEGATIVE Final   Influenza B by PCR NEGATIVE NEGATIVE Final    Comment: (NOTE) The Xpert Xpress SARS-CoV-2/FLU/RSV assay is intended as an aid in  the diagnosis of influenza from Nasopharyngeal swab specimens and  should not be used as a sole basis for treatment. Nasal washings and  aspirates are unacceptable for Xpert Xpress SARS-CoV-2/FLU/RSV  testing. Fact Sheet for Patients: PinkCheek.be Fact Sheet for Healthcare Providers: GravelBags.it This test is not yet approved or cleared by the [place] FDA and  has been authorized for detection and/or diagnosis of SARS-CoV-2 by  FDA under an Emergency Use Authorization (EUA). This EUA will remain  in effect (meaning this test can be used) for the duration of the  Covid-19 declaration under Section 564(b)(1) of the Act, 21  U.S.C. section 360bbb-3(b)(1), unless the authorization is  terminated or revoked. Performed at [facility], [place] [address], [place], [place] [address]     Imaging: NM Hepatobiliary Liver Func  Result Date: 03/21/2019 CLINICAL DATA:  Upper abdominal and chest pain approximately 4 days. EXAM: NUCLEAR MEDICINE HEPATOBILIARY IMAGING WITH GALLBLADDER EF TECHNIQUE: Sequential images of the abdomen were obtained out to 60 minutes following intravenous administration of radiopharmaceutical. After oral ingestion of Ensure, gallbladder ejection fraction was determined. At 60 min, normal ejection fraction is greater than 33%. RADIOPHARMACEUTICALS:  4.78 mCi Tc-2m  Choletec IV COMPARISON:  Right upper quadrant ultrasound 03/19/2019.  FINDINGS: Prompt uptake and biliary excretion of activity by the liver is seen. Gallbladder activity is visualized, consistent with patency of cystic duct. Biliary activity passes into small bowel, consistent with patent common bile duct. Calculated gallbladder ejection fraction is 67%. (Normal gallbladder ejection fraction with Ensure is greater than 33%.) IMPRESSION: Normal exam. Electronically Signed   By: Inge Rise M.D.   On: 03/21/2019 12:11

## 2019-03-23 ENCOUNTER — Telehealth (HOSPITAL_COMMUNITY): Payer: Self-pay | Admitting: Nephrology

## 2019-03-23 NOTE — Telephone Encounter (Signed)
Transition of care contact from inpatient facility  Date of discharge: 03/22/19 Date of contact: 03/23/19 Method: Phone Spoke to: Patient's daughter - [name]  Patient contacted to discuss transition of care from recent inpatient hospitalization. Patient was admitted to [facility] from 03/19/19 to 03/22/19 with discharge diagnosis of abdominal pain - initially felt related to cholecystitis, but improved with short course abx.  Medication changes were reviewed. No major changes - family has all her meds  Patient will follow up with his/her outpatient HD unit on: tomorrow - Thursday 2/25  Daughter asking about injection which was given at HD yesterday - confirmed with her unit, was Hep B booster.  Other f/u needs include: none at this itme.  Veneta Penton, PA-C [employer] Pager [phone]

## 2019-03-25 ENCOUNTER — Other Ambulatory Visit: Payer: Self-pay

## 2019-03-25 ENCOUNTER — Inpatient Hospital Stay (HOSPITAL_COMMUNITY)
Admission: EM | Admit: 2019-03-25 | Discharge: 2019-03-30 | DRG: 377 | Disposition: A | Discharge status: Home Health | Payer: Medicare Other | Attending: Student | Admitting: Student

## 2019-03-25 ENCOUNTER — Emergency Department (HOSPITAL_COMMUNITY): Payer: Medicare Other

## 2019-03-25 ENCOUNTER — Encounter (HOSPITAL_COMMUNITY): Payer: Self-pay | Admitting: *Deleted

## 2019-03-25 DIAGNOSIS — J449 Chronic obstructive pulmonary disease, unspecified: Secondary | ICD-10-CM | POA: Diagnosis present

## 2019-03-25 DIAGNOSIS — K2990 Gastroduodenitis, unspecified, without bleeding: Secondary | ICD-10-CM

## 2019-03-25 DIAGNOSIS — I471 Supraventricular tachycardia: Secondary | ICD-10-CM | POA: Diagnosis present

## 2019-03-25 DIAGNOSIS — K922 Gastrointestinal hemorrhage, unspecified: Secondary | ICD-10-CM

## 2019-03-25 DIAGNOSIS — Z9049 Acquired absence of other specified parts of digestive tract: Secondary | ICD-10-CM

## 2019-03-25 DIAGNOSIS — E785 Hyperlipidemia, unspecified: Secondary | ICD-10-CM | POA: Diagnosis not present

## 2019-03-25 DIAGNOSIS — K5710 Diverticulosis of small intestine without perforation or abscess without bleeding: Secondary | ICD-10-CM | POA: Diagnosis present

## 2019-03-25 DIAGNOSIS — D500 Iron deficiency anemia secondary to blood loss (chronic): Secondary | ICD-10-CM | POA: Diagnosis not present

## 2019-03-25 DIAGNOSIS — Z992 Dependence on renal dialysis: Secondary | ICD-10-CM | POA: Diagnosis not present

## 2019-03-25 DIAGNOSIS — K253 Acute gastric ulcer without hemorrhage or perforation: Secondary | ICD-10-CM | POA: Diagnosis not present

## 2019-03-25 DIAGNOSIS — K2210 Ulcer of esophagus without bleeding: Secondary | ICD-10-CM | POA: Diagnosis present

## 2019-03-25 DIAGNOSIS — I480 Paroxysmal atrial fibrillation: Secondary | ICD-10-CM | POA: Diagnosis present

## 2019-03-25 DIAGNOSIS — F319 Bipolar disorder, unspecified: Secondary | ICD-10-CM | POA: Diagnosis present

## 2019-03-25 DIAGNOSIS — E872 Acidosis: Secondary | ICD-10-CM | POA: Diagnosis present

## 2019-03-25 DIAGNOSIS — J9610 Chronic respiratory failure, unspecified whether with hypoxia or hypercapnia: Secondary | ICD-10-CM | POA: Diagnosis present

## 2019-03-25 DIAGNOSIS — M109 Gout, unspecified: Secondary | ICD-10-CM | POA: Diagnosis present

## 2019-03-25 DIAGNOSIS — K559 Vascular disorder of intestine, unspecified: Secondary | ICD-10-CM | POA: Diagnosis present

## 2019-03-25 DIAGNOSIS — I708 Atherosclerosis of other arteries: Secondary | ICD-10-CM | POA: Diagnosis present

## 2019-03-25 DIAGNOSIS — I4891 Unspecified atrial fibrillation: Secondary | ICD-10-CM | POA: Diagnosis not present

## 2019-03-25 DIAGNOSIS — Z20822 Contact with and (suspected) exposure to covid-19: Secondary | ICD-10-CM | POA: Diagnosis present

## 2019-03-25 DIAGNOSIS — R933 Abnormal findings on diagnostic imaging of other parts of digestive tract: Secondary | ICD-10-CM | POA: Diagnosis present

## 2019-03-25 DIAGNOSIS — N186 End stage renal disease: Secondary | ICD-10-CM | POA: Diagnosis present

## 2019-03-25 DIAGNOSIS — I2510 Atherosclerotic heart disease of native coronary artery without angina pectoris: Secondary | ICD-10-CM | POA: Diagnosis present

## 2019-03-25 DIAGNOSIS — K2970 Gastritis, unspecified, without bleeding: Secondary | ICD-10-CM | POA: Diagnosis present

## 2019-03-25 DIAGNOSIS — D62 Acute posthemorrhagic anemia: Secondary | ICD-10-CM | POA: Diagnosis present

## 2019-03-25 DIAGNOSIS — M79605 Pain in left leg: Secondary | ICD-10-CM | POA: Diagnosis present

## 2019-03-25 DIAGNOSIS — F1410 Cocaine abuse, uncomplicated: Secondary | ICD-10-CM | POA: Diagnosis present

## 2019-03-25 DIAGNOSIS — I10 Essential (primary) hypertension: Secondary | ICD-10-CM | POA: Diagnosis present

## 2019-03-25 DIAGNOSIS — I739 Peripheral vascular disease, unspecified: Secondary | ICD-10-CM | POA: Diagnosis present

## 2019-03-25 DIAGNOSIS — D631 Anemia in chronic kidney disease: Secondary | ICD-10-CM | POA: Diagnosis present

## 2019-03-25 DIAGNOSIS — R1084 Generalized abdominal pain: Secondary | ICD-10-CM | POA: Diagnosis not present

## 2019-03-25 DIAGNOSIS — Z87891 Personal history of nicotine dependence: Secondary | ICD-10-CM

## 2019-03-25 DIAGNOSIS — J9600 Acute respiratory failure, unspecified whether with hypoxia or hypercapnia: Secondary | ICD-10-CM

## 2019-03-25 DIAGNOSIS — K2980 Duodenitis without bleeding: Secondary | ICD-10-CM | POA: Diagnosis present

## 2019-03-25 DIAGNOSIS — R339 Retention of urine, unspecified: Secondary | ICD-10-CM | POA: Diagnosis not present

## 2019-03-25 DIAGNOSIS — G8929 Other chronic pain: Secondary | ICD-10-CM | POA: Diagnosis present

## 2019-03-25 DIAGNOSIS — K264 Chronic or unspecified duodenal ulcer with hemorrhage: Principal | ICD-10-CM | POA: Diagnosis present

## 2019-03-25 DIAGNOSIS — I120 Hypertensive chronic kidney disease with stage 5 chronic kidney disease or end stage renal disease: Secondary | ICD-10-CM | POA: Diagnosis present

## 2019-03-25 DIAGNOSIS — K449 Diaphragmatic hernia without obstruction or gangrene: Secondary | ICD-10-CM | POA: Diagnosis present

## 2019-03-25 DIAGNOSIS — E871 Hypo-osmolality and hyponatremia: Secondary | ICD-10-CM | POA: Diagnosis present

## 2019-03-25 DIAGNOSIS — R079 Chest pain, unspecified: Secondary | ICD-10-CM

## 2019-03-25 DIAGNOSIS — R1011 Right upper quadrant pain: Secondary | ICD-10-CM | POA: Diagnosis not present

## 2019-03-25 DIAGNOSIS — N2581 Secondary hyperparathyroidism of renal origin: Secondary | ICD-10-CM | POA: Diagnosis present

## 2019-03-25 DIAGNOSIS — I639 Cerebral infarction, unspecified: Secondary | ICD-10-CM | POA: Diagnosis present

## 2019-03-25 DIAGNOSIS — K921 Melena: Secondary | ICD-10-CM | POA: Diagnosis present

## 2019-03-25 DIAGNOSIS — K269 Duodenal ulcer, unspecified as acute or chronic, without hemorrhage or perforation: Secondary | ICD-10-CM | POA: Diagnosis present

## 2019-03-25 DIAGNOSIS — Z8673 Personal history of transient ischemic attack (TIA), and cerebral infarction without residual deficits: Secondary | ICD-10-CM

## 2019-03-25 DIAGNOSIS — M79604 Pain in right leg: Secondary | ICD-10-CM | POA: Diagnosis present

## 2019-03-25 DIAGNOSIS — I25119 Atherosclerotic heart disease of native coronary artery with unspecified angina pectoris: Secondary | ICD-10-CM | POA: Diagnosis present

## 2019-03-25 DIAGNOSIS — F418 Other specified anxiety disorders: Secondary | ICD-10-CM | POA: Diagnosis present

## 2019-03-25 DIAGNOSIS — F1490 Cocaine use, unspecified, uncomplicated: Secondary | ICD-10-CM | POA: Diagnosis not present

## 2019-03-25 DIAGNOSIS — K219 Gastro-esophageal reflux disease without esophagitis: Secondary | ICD-10-CM | POA: Diagnosis present

## 2019-03-25 LAB — URINALYSIS, ROUTINE W REFLEX MICROSCOPIC
Bacteria, UA: NONE SEEN
Bilirubin Urine: NEGATIVE
Glucose, UA: 50 mg/dL — AB
Hgb urine dipstick: NEGATIVE
Ketones, ur: NEGATIVE mg/dL
Leukocytes,Ua: NEGATIVE
Nitrite: NEGATIVE
Protein, ur: 100 mg/dL — AB
Specific Gravity, Urine: 1.013 (ref 1.005–1.030)
pH: 7 (ref 5.0–8.0)

## 2019-03-25 LAB — CBC
HCT: 30.1 % — ABNORMAL LOW (ref 36.0–46.0)
HCT: 26.9 % — ABNORMAL LOW (ref 36.0–46.0)
Hemoglobin: 10 g/dL — ABNORMAL LOW (ref 12.0–15.0)
Hemoglobin: 9 g/dL — ABNORMAL LOW (ref 12.0–15.0)
MCH: 30.6 pg (ref 26.0–34.0)
MCH: 30.3 pg (ref 26.0–34.0)
MCHC: 33.2 g/dL (ref 30.0–36.0)
MCHC: 33.5 g/dL (ref 30.0–36.0)
MCV: 92 fL (ref 80.0–100.0)
MCV: 90.6 fL (ref 80.0–100.0)
Platelets: 184 10*3/uL (ref 150–400)
Platelets: 189 10*3/uL (ref 150–400)
RBC: 3.27 MIL/uL — ABNORMAL LOW (ref 3.87–5.11)
RBC: 2.97 MIL/uL — ABNORMAL LOW (ref 3.87–5.11)
RDW: 15.9 % — ABNORMAL HIGH (ref 11.5–15.5)
RDW: 16 % — ABNORMAL HIGH (ref 11.5–15.5)
WBC: 14.7 10*3/uL — ABNORMAL HIGH (ref 4.0–10.5)
WBC: 14.7 10*3/uL — ABNORMAL HIGH (ref 4.0–10.5)
nRBC: 0 % (ref 0.0–0.2)
nRBC: 0.1 % (ref 0.0–0.2)

## 2019-03-25 LAB — RAPID URINE DRUG SCREEN, HOSP PERFORMED
Amphetamines: NOT DETECTED
Barbiturates: NOT DETECTED
Benzodiazepines: NOT DETECTED
Cocaine: POSITIVE — AB
Opiates: NOT DETECTED
Tetrahydrocannabinol: NOT DETECTED

## 2019-03-25 LAB — LACTIC ACID, PLASMA
Lactic Acid, Venous: 2.7 mmol/L (ref 0.5–1.9)
Lactic Acid, Venous: 2.4 mmol/L (ref 0.5–1.9)
Lactic Acid, Venous: 1.4 mmol/L (ref 0.5–1.9)

## 2019-03-25 LAB — COMPREHENSIVE METABOLIC PANEL
ALT: 20 U/L (ref 0–44)
AST: 30 U/L (ref 15–41)
Albumin: 2.4 g/dL — ABNORMAL LOW (ref 3.5–5.0)
Alkaline Phosphatase: 53 U/L (ref 38–126)
Anion gap: 22 — ABNORMAL HIGH (ref 5–15)
BUN: 80 mg/dL — ABNORMAL HIGH (ref 8–23)
CO2: 16 mmol/L — ABNORMAL LOW (ref 22–32)
Calcium: 8.3 mg/dL — ABNORMAL LOW (ref 8.9–10.3)
Chloride: 98 mmol/L (ref 98–111)
Creatinine, Ser: 5.62 mg/dL — ABNORMAL HIGH (ref 0.44–1.00)
GFR calc Af Amer: 8 mL/min — ABNORMAL LOW (ref >60)
GFR calc non Af Amer: 7 mL/min — ABNORMAL LOW (ref >60)
Glucose, Bld: 174 mg/dL — ABNORMAL HIGH (ref 70–99)
Potassium: 4 mmol/L (ref 3.5–5.1)
Sodium: 136 mmol/L (ref 135–145)
Total Bilirubin: 0.5 mg/dL (ref 0.3–1.2)
Total Protein: 4.7 g/dL — ABNORMAL LOW (ref 6.5–8.1)

## 2019-03-25 LAB — MAGNESIUM: Magnesium: 1.9 mg/dL (ref 1.7–2.4)

## 2019-03-25 LAB — AMYLASE: Amylase: 97 U/L (ref 28–100)

## 2019-03-25 LAB — TROPONIN I (HIGH SENSITIVITY)
Troponin I (High Sensitivity): 55 ng/L — ABNORMAL HIGH (ref <18)
Troponin I (High Sensitivity): 50 ng/L — ABNORMAL HIGH (ref <18)

## 2019-03-25 LAB — CBC WITH DIFFERENTIAL/PLATELET
Abs Immature Granulocytes: 0.14 10*3/uL — ABNORMAL HIGH (ref 0.00–0.07)
Basophils Absolute: 0.1 10*3/uL (ref 0.0–0.1)
Basophils Relative: 1 %
Eosinophils Absolute: 0.2 10*3/uL (ref 0.0–0.5)
Eosinophils Relative: 1 %
HCT: 16.8 % — ABNORMAL LOW (ref 36.0–46.0)
Hemoglobin: 5.1 g/dL — CL (ref 12.0–15.0)
Immature Granulocytes: 1 %
Lymphocytes Relative: 15 %
Lymphs Abs: 1.6 10*3/uL (ref 0.7–4.0)
MCH: 30.4 pg (ref 26.0–34.0)
MCHC: 30.4 g/dL (ref 30.0–36.0)
MCV: 100 fL (ref 80.0–100.0)
Monocytes Absolute: 0.7 10*3/uL (ref 0.1–1.0)
Monocytes Relative: 7 %
Neutro Abs: 8.1 10*3/uL — ABNORMAL HIGH (ref 1.7–7.7)
Neutrophils Relative %: 75 %
Platelets: 218 10*3/uL (ref 150–400)
RBC: 1.68 MIL/uL — ABNORMAL LOW (ref 3.87–5.11)
RDW: 15.9 % — ABNORMAL HIGH (ref 11.5–15.5)
WBC: 10.8 10*3/uL — ABNORMAL HIGH (ref 4.0–10.5)
nRBC: 0 % (ref 0.0–0.2)

## 2019-03-25 LAB — PHOSPHORUS: Phosphorus: 6.2 mg/dL — ABNORMAL HIGH (ref 2.5–4.6)

## 2019-03-25 LAB — BRAIN NATRIURETIC PEPTIDE: B Natriuretic Peptide: 2007.2 pg/mL — ABNORMAL HIGH (ref 0.0–100.0)

## 2019-03-25 LAB — PROTIME-INR
INR: 1.1 (ref 0.8–1.2)
Prothrombin Time: 14.3 seconds (ref 11.4–15.2)

## 2019-03-25 LAB — SARS CORONAVIRUS 2 (TAT 6-24 HRS): SARS Coronavirus 2: NEGATIVE

## 2019-03-25 LAB — POC SARS CORONAVIRUS 2 AG -  ED: SARS Coronavirus 2 Ag: NEGATIVE

## 2019-03-25 LAB — LIPASE, BLOOD: Lipase: 64 U/L — ABNORMAL HIGH (ref 11–51)

## 2019-03-25 LAB — PROCALCITONIN: Procalcitonin: 1.34 ng/mL

## 2019-03-25 LAB — PREPARE RBC (CROSSMATCH)

## 2019-03-25 MED ORDER — BUDESONIDE 0.25 MG/2ML IN SUSP
0.2500 mg | Freq: Two times a day (BID) | RESPIRATORY_TRACT | Status: DC
  Filled 2019-03-25 (×2): qty 2

## 2019-03-25 MED ORDER — SODIUM CHLORIDE 0.9 % IV SOLN: INTRAVENOUS | Status: DC

## 2019-03-25 MED ORDER — CHLORHEXIDINE GLUCONATE CLOTH 2 % EX PADS
6.0000 | MEDICATED_PAD | Freq: Every day | CUTANEOUS | Status: DC
  Administered 2019-03-26 – 2019-03-28 (×3): 6 via TOPICAL

## 2019-03-25 MED ORDER — ALBUTEROL SULFATE (2.5 MG/3ML) 0.083% IN NEBU: 2.5000 mg | INHALATION_SOLUTION | RESPIRATORY_TRACT | Status: DC | PRN

## 2019-03-25 MED ORDER — ACETAMINOPHEN 325 MG PO TABS: 650.0000 mg | ORAL_TABLET | ORAL | Status: DC | PRN

## 2019-03-25 MED ORDER — FENTANYL CITRATE (PF) 100 MCG/2ML IJ SOLN
50.0000 ug | Freq: Once | INTRAMUSCULAR | Status: AC
  Administered 2019-03-25: 50 ug via INTRAVENOUS
  Filled 2019-03-25: qty 2

## 2019-03-25 MED ORDER — PANTOPRAZOLE SODIUM 40 MG IV SOLR
40.0000 mg | Freq: Every day | INTRAVENOUS | Status: DC
  Administered 2019-03-25: 21:00:00 40 mg via INTRAVENOUS
  Filled 2019-03-25: qty 40

## 2019-03-25 MED ORDER — DOXERCALCIFEROL 4 MCG/2ML IV SOLN
2.0000 ug | INTRAVENOUS | Status: DC
  Administered 2019-03-29: 2 ug via INTRAVENOUS
  Filled 2019-03-25 (×2): qty 2

## 2019-03-25 MED ORDER — IPRATROPIUM-ALBUTEROL 0.5-2.5 (3) MG/3ML IN SOLN
3.0000 mL | Freq: Four times a day (QID) | RESPIRATORY_TRACT | Status: DC
  Administered 2019-03-25 (×2): 3 mL via RESPIRATORY_TRACT
  Filled 2019-03-25 (×2): qty 3

## 2019-03-25 MED ORDER — HYDRALAZINE HCL 20 MG/ML IJ SOLN
5.0000 mg | Freq: Four times a day (QID) | INTRAMUSCULAR | Status: DC | PRN
  Administered 2019-03-25: 19:00:00 5 mg via INTRAVENOUS
  Filled 2019-03-25: qty 1

## 2019-03-25 MED ORDER — ARFORMOTEROL TARTRATE 15 MCG/2ML IN NEBU
15.0000 ug | INHALATION_SOLUTION | Freq: Two times a day (BID) | RESPIRATORY_TRACT | Status: DC
  Administered 2019-03-25 – 2019-03-30 (×9): 15 ug via RESPIRATORY_TRACT
  Filled 2019-03-25 (×10): qty 2

## 2019-03-25 MED ORDER — BUDESONIDE 0.5 MG/2ML IN SUSP
0.5000 mg | Freq: Two times a day (BID) | RESPIRATORY_TRACT | Status: DC
  Administered 2019-03-25 – 2019-03-30 (×8): 0.5 mg via RESPIRATORY_TRACT
  Filled 2019-03-25 (×9): qty 2

## 2019-03-25 MED ORDER — SODIUM CHLORIDE 0.9 % IV SOLN
10.0000 mL/h | Freq: Once | INTRAVENOUS | Status: AC
  Administered 2019-03-25: 10 mL/h via INTRAVENOUS

## 2019-03-25 MED ORDER — ONDANSETRON HCL 4 MG/2ML IJ SOLN
4.0000 mg | Freq: Four times a day (QID) | INTRAMUSCULAR | Status: DC | PRN
  Administered 2019-03-25: 4 mg via INTRAVENOUS
  Filled 2019-03-25: qty 2

## 2019-03-25 MED ORDER — IOHEXOL 350 MG/ML SOLN
100.0000 mL | Freq: Once | INTRAVENOUS | Status: AC
  Administered 2019-03-25: 80 mL via INTRAVENOUS

## 2019-03-25 MED ORDER — IPRATROPIUM-ALBUTEROL 0.5-2.5 (3) MG/3ML IN SOLN
3.0000 mL | Freq: Two times a day (BID) | RESPIRATORY_TRACT | Status: DC
  Administered 2019-03-26 – 2019-03-30 (×7): 3 mL via RESPIRATORY_TRACT
  Filled 2019-03-25 (×7): qty 3

## 2019-03-25 NOTE — Progress Notes (Addendum)
[facility] Progress Note    Background: Christina [name] is a 69 Y/O female with ESRD on hemodialysis at [facility]. PMH: HTN, Afib, CAD, COPD, polysubstance abuse including crack cocaine,Bipolar disorder, SHPT, AOCD. She has had five hospital admission this year. Most recent: 02/20-02/23/2021 for epigastric/RUQ abdominal pain. She presented to ED again today with C/O abdominal pain, HGB 5.1, UDA positive for cocaine. 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 Lactic acid 2.7. She has been admitted for GIB, abdominal and chest pain per primary. Last HD 03/22/2019 stayed for 2:32 hours of 4 hour treatment, left under EDW.    Subjective: C/O severe abdominal pain/nausea   Objective Vitals:   03/25/19 1300 03/25/19 1315 03/25/19 1345 03/25/19 1603  BP: (!) 150/76 (!) 170/81 (!) 163/89 (!) 149/88  Pulse:   85 95  Resp: 18 (!) 24 (!) 28 19  Temp:    98.1 F (36.7 C)  TempSrc:    Oral  SpO2:   100% 96%  Weight:      Height:       Physical Exam General:Chronically ill appearing female in NAD Heart: S1,S2 irreg, irreg 1/6 systolic M. AFib on monitor rate 95-100s Lungs: CTAB A/P Abdomen: S, NT, no ascites, active BS Extremities: No LE edema Dialysis Access: RIJ [facility] CDI    Additional Objective Labs: Basic Metabolic Panel: Recent Labs  Lab 03/21/19 1029 03/22/19 0802 03/25/19 0611 03/25/19 1313  NA 138 139 136  --   K 3.2* 3.3* 4.0  --   CL 98 100 98  --   CO2 23 22 16*  --   GLUCOSE 189* 230* 174*  --   BUN 29* 40* 80*  --   CREATININE 3.80* 5.13* 5.62*  --   CALCIUM 7.3* 7.4* 8.3*  --   PHOS 6.2*  --   --  6.2*   Liver Function Tests: Recent Labs  Lab 03/19/19 1400 03/19/19 1400 03/20/19 0300 03/21/19 1029 03/25/19 0611  AST 24  --  16  --  30  ALT 17  --  12  --  20  ALKPHOS 85  --  65  --  53   BILITOT 0.6  --  0.7  --  0.5  PROT 6.5  --  5.0*  --  4.7*  ALBUMIN 3.3*   < > 2.5* 2.5* 2.4*   < > = values in this interval not displayed.   Recent Labs  Lab 03/19/19 1400 03/25/19 0611 03/25/19 1313  LIPASE 49 64*  --   AMYLASE  --   --  97   CBC: Recent Labs  Lab 03/19/19 1400 03/19/19 1400 03/20/19 0300 03/20/19 0300 03/21/19 1029 03/22/19 0802 03/25/19 0611  WBC 13.0*   < > 10.0   < > 9.1 11.3* 10.8*  NEUTROABS  --   --   --   --  7.5 9.6* 8.1*  HGB 9.6*   < > 8.3*   < > 7.7* 7.8* 5.1*  HCT 32.2*   < > 26.3*   < > 25.2* 25.7* 16.8*  MCV 101.9*  --  96.3  --  97.3 100.4* 100.0  PLT 330   < > 310   < > 224 211 218   < > = values in this interval not displayed.   Blood Culture    Component Value Date/Time   SDES URINE, RANDOM 02/21/2019  [place]  02/21/2019 1440    NONE Performed at [facility], [place] [address], [place], [place] [address]    CULT MULTIPLE SPECIES PRESENT, SUGGEST RECOLLECTION (A) 02/21/2019 1440   REPTSTATUS 02/22/2019 FINAL 02/21/2019 1440    Cardiac Enzymes: No results for input(s): CKTOTAL, CKMB, CKMBINDEX, TROPONINI in the last 168 hours. CBG: No results for input(s): GLUCAP in the last 168 hours. Iron Studies: No results for input(s): IRON, TIBC, TRANSFERRIN, FERRITIN in the last 72 hours. @lablastinr3 @ Studies/Results: DG Chest Port 1 View  Result Date: 03/25/2019 CLINICAL DATA:  Weakness with abdominal pain. EXAM: PORTABLE CHEST 1 VIEW COMPARISON:  03/19/2019 FINDINGS: 7:50 a.m. Cardiopericardial silhouette is at upper limits of normal for size. The lungs are clear without focal pneumonia, edema, pneumothorax or pleural effusion. Interstitial markings are diffusely coarsened with chronic features. Right-sided central line tip is stable, positioned over the upper right atrium. The visualized bony structures of the thorax are intact. Telemetry leads overlie the chest. IMPRESSION: Stable.  No acute findings. Electronically  Signed   By: Misty Stanley M.D.   On: 03/25/2019 08:26   CT Angio Chest/Abd/Pel for Dissection W and/or Wo Contrast  Addendum Date: 03/25/2019   ADDENDUM REPORT: 03/25/2019 09:28 ADDENDUM: These results were called by telephone at the time of interpretation on 03/25/2019 at 9:28 am to provider [facility] RAY , who verbally acknowledged these results. Electronically Signed   By: Zetta Bills M.D.   On: 03/25/2019 09:28   Result Date: 03/25/2019 CLINICAL DATA:  Abdominal pain, acute with nausea and vomiting for 1 week. EXAM: CT ANGIOGRAPHY CHEST, ABDOMEN AND PELVIS TECHNIQUE: Multidetector CT imaging through the chest, abdomen and pelvis was performed using the standard protocol during bolus administration of intravenous contrast. Multiplanar reconstructed images and MIPs were obtained and reviewed to evaluate the vascular anatomy. CONTRAST:  48mL OMNIPAQUE IOHEXOL 350 MG/ML SOLN COMPARISON:  03/19/2019 and multiple prior studies. FINDINGS: CTA CHEST FINDINGS Cardiovascular: No signs of intramural hematoma within the aorta on noncontrast imaging. Low-attenuation cardiac blood pool compatible with anemia. Right sided catheter with small associated thrombus or fibrin sheath (image 39, series 8) findings are similar to the previous exam with low-attenuation crescent around the catheter in the proximal SVC. Abundant atherosclerotic plaque of the thoracic aorta both calcified but predominantly noncalcified without interval change with multiple areas of plaque irregularity but without signs of dissection. Patent great vessels in the chest. Heart size remains enlarged with extensive coronary artery disease, similar to the prior study. No signs of pericardial effusion. Mediastinum/Nodes: Scattered lymph nodes about the mediastinum without change. No axillary lymphadenopathy. No thoracic inlet lymphadenopathy. Lungs/Pleura: No signs of consolidation or evidence of pleural effusion. Airways are patent. Signs of pulmonary  emphysema with centrilobular predominance as noted on the previous study. Airways are patent. Scattered 2-3 mm pulmonary nodules with similar appearance most notably in the right upper lobe. Musculoskeletal: No signs of acute bone finding or evidence of destructive bone process. No chest wall mass. See below for for full detail. Review of the MIP images confirms the above findings. CTA ABDOMEN AND PELVIS FINDINGS VASCULAR Aorta: Abundant plaque in the abdominal aorta becoming less severe in the distal aorta. Caliber at the aortic hiatus 2.5 x 2.3 cm. Just below the renal arteries 2.5 by 2.4 cm. Distal aorta just above the iliac bifurcation 2.4 x 2.2 cm. No signs of dissection in the abdominal aorta. 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 Collateral pathways arising from the SMA. Vessels in the celiac distribution remain attenuated but patent. SMA: SMA remains attenuated/narrowed with small lumen approximately 1 cm from its origin. Lumen between 2 and 3 mm at this level where as the origin with lumen approximately 5 mm, 7 mm presumed poststenotic dilation just beyond the area of narrowing as before. This shows patency distally but is again supplying the celiac distribution as well. No change since the prior study in this appearance. Renals: Small renal arteries bilaterally with attenuated vessel on the left supplying a scarred left kidney. Right renal artery with abundant plaque at the origin, vessel attenuated distally as before. IMA: IMA is not visualized. Distal aspect of the vessel with some branches that may show minimal opacification suggest collateral pathways. Inflow: Patent without evidence of aneurysm, dissection, vasculitis or significant stenosis. Veins: Limited assessment of venous structures on arterial phase. Review of the MIP images confirms the above findings. NON-VASCULAR Hepatobiliary: Liver is unremarkable. No signs of  pericholecystic stranding. Gallstones layer in the gallbladder. No biliary ductal dilation. Pancreas: Pancreas unremarkable, no signs of peripancreatic inflammation or ductal dilation. Spleen: Spleen is normal. Adrenals/Urinary Tract: Adrenal masses bilaterally, on the left measuring 2.7 x 2.3 cm, on the right measuring 2.6 x 1.4 cm as compared to 2.5 x 1.9 cm. Lesions are similar over time accounting for differences in measurement technique dating back to July of 2020. The right adrenal mass is hyperenhancing on arterial phase more so than the left. Cortical scarring is evident bilaterally with renal vascular calcifications and presumed cysts, also not changed. Urinary bladder is unremarkable. No signs of hydronephrosis. Stomach/Bowel: Under distension versus gastric thickening. Moderate-sized duodenal diverticulum. No signs of bowel obstruction. Post small bowel resection in the left hemiabdomen. Colon is partially fluid-filled. Lymphatic: No signs of abdominal adenopathy. No signs of pelvic lymphadenopathy. Reproductive: Post hysterectomy. Other: No free air. No significant hernia or ascites. Musculoskeletal: No acute bone finding or destructive bone process. Review of the MIP images confirms the above findings. IMPRESSION: CTA CHEST 1. No evidence of acute intramural hematoma or dissection involving the thoracic aorta. 2. Abundant atherosclerotic plaque of the thoracic aorta both calcified but predominantly noncalcified. 3. Cardiomegaly with extensive coronary artery disease. 4. Fibrin sheath or small amount of adherent thrombus along the right IJ catheter in the proximal SVC. 5. Tiny pulmonary nodules are unchanged. CTA ABDOMEN AND PELVIS 1. 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 Also with probable occlusion of the IMA origin. Given this configuration the patient is at high risk for mesenteric ischemia and or mesenteric  angina. No overt signs of ischemia on today's scan. Lactate correlation may be helpful. 2. No signs of bowel obstruction. 3. Bilateral adrenal masses. The left having a more benign appearance. Hyperenhancement and baseline hyperdensity of the right adrenal mass despite stability could be indicative of pheochromocytoma. Biochemical correlation may be helpful if not yet performed. 4. Cholelithiasis as before. 5. Under distension versus gastric thickening correlate with any symptoms of gastritis. 6. Emphysema and aortic atherosclerosis. 7. Lucency of distal humerus may be related to artifact on the scout view. Correlate with any pain in this area and consider dedicated plain film assessment as warranted. 8. Call is out to the referring provider to further discuss findings in the above case. Aortic Atherosclerosis (ICD10-I70.0) and Emphysema (ICD10-J43.9). Electronically Signed: By: Zetta Bills M.D. On: 03/25/2019 09:19   Medications: . sodium chloride 10 mL/hr at  03/25/19 1607   . arformoterol  15 mcg Nebulization BID  . budesonide (PULMICORT) nebulizer solution  0.5 mg Nebulization BID  . ipratropium-albuterol  3 mL Nebulization Q6H  . pantoprazole (PROTONIX) IV  40 mg Intravenous QHS    HD orders: [place] T,Th,S 4 hrs 180NRe 250/Autoflow 1.5 44 kg 2.0 K/2.0 Ca UFP 2 -No heparin -Venofer 50 mg IV weekly (last dose 100 mg IV 03/22/19) -Calcitriol 1.0 mcg PO TIW  Assessment/Plan: 1. GIB: HGB 5.1 on adm. Last OP HGB 7.9 03/22/19. Has rec'd 2 units PRBCs per primary. No repeat HGB yet. GI  consulted 2. Abdominal/Chest pain. Gen. Surgery consulted. No evidence of overt ischemia on CT today. Keep SBP >100. Tested positive for cocaine-may be a contributing factor. AST/ALT not elevated.   3. ESRD -T,Th.S-Missed and truncated treatments. Continue to have urine output. HD tomorrow on schedule.   4. Anemia -As noted above. Mircera 30 mcg IV 03/15/2019. No ESA ordered in [place]. Follow HGB, may need to redose  ESA. 5. Secondary hyperparathyroidism -Binders on hold while NPO. Use IV Hectorol until able to take PO meds.  6. HTN/volume -Appears dry on exam. CXR unremarkable. Currently NPO. Resume home meds when able to eat. 0.5-1 kg UFG with HD tomorrow 7. Nutrition-NPO at present. Very low albumin. Renal diet with prostat, renal vits when able to eat.  8. COPD-per primary   Rita H. Brown NP-C 03/25/2019, 4:19 PM  [employer] [phone]

## 2019-03-25 NOTE — H&P (Signed)
PULMONARY / CRITICAL CARE MEDICINE   NAME:  Christina [name], MRN:  [id number], DOB:  11/04/1950, LOS: 0 ADMISSION DATE:  03/25/2019, CONSULTATION DATE:  03/25/2019 REFERRING MD:  Pattricia Boss, CHIEF COMPLAINT:    BRIEF HISTORY:    69 year old female former smoker ( Quit 2019) with PMH significant for ESRD on HD ( T/ Th/ Sat)  with history of multiple missed dialyses, HTN, A. Fib without anticcoagulation, CAD, COPD, history of SBO and depression who presents 2/26  with abdominal and chest pain, history of diarrhea and weakness x 1 week. Last HD was 2/23. HGB of 5.1 on arrival to the ED.Suspected ischemic bowel. PCCM were asked to admit to ICU and manage care .  HISTORY OF PRESENT ILLNESS   69 year old female former smoker ( Quit 2019) with PMH significant for ESRD on HD ( T/ Th/ Sat)  with history of multiple missed dialyses, HTN, A. Fib without anticcoagulation, CAD, COPD, history of SBO and depression who presents 2/26  with abdominal and chest pain, history of diarrhea ( dark foul smelling stools) and weakness x 1 week. She has been drinking, not eating.  Last HD was 2/23. HGB of 5.1 on arrival to the ED.Suspected ischemic bowel  ED Course In the ED HGB was found to be 5.1, WBC was 10.8, K was 4.0, Serum CO2 of 16, Creatinine of 5.62, Lipase 64, HS Troponin of 55, Lactic Acid was 2.7. ABG 7.40/234/431/HCO3 21 on NRB>>changed to 3 L Afebrile, Transfused 1 unit PRBC in ED.   SIGNIFICANT PAST MEDICAL HISTORY    Past Medical History:  Diagnosis Date  . Anemia   . Anxiety   . Arthritis   . Asthma   . Atrial fibrillation ([place])   . Chronic kidney disease    Dialysis T/Th/Sa  started in March 2020  . COPD (chronic obstructive pulmonary disease) ([place])   . Coronary artery disease    2 stents  . Depression   . GERD (gastroesophageal reflux disease)   . Gout   . Headache    migraines  . History of kidney stones   . Hyperlipidemia   . Hypertension   . Pneumonia   . Small bowel obstruction  ([place])     SIGNIFICANT EVENTS:  Recent Admission 03/19/2019-03/22/2019 03/25/2019 Admission to [facility] ICU STUDIES:   CXR  03/25/2019 Cardiopericardial silhouette is at upper limits of normal for size. The lungs are clear without focal pneumonia, edema, pneumothorax or pleural effusion. Interstitial markings are diffusely coarsened with chronic features. Right-sided central line tip is stable, positioned over the upper right atrium  03/25/2019 CT Angio Chest Abdomen/Pelvis Chest No evidence of acute intramural hematoma or dissection involving the thoracic aorta. Abundant atherosclerotic plaque of the thoracic aorta both calcified but predominantly noncalcified. Cardiomegaly with extensive coronary artery disease. Fibrin sheath or small amount of adherent thrombus along the right IJ catheter in the proximal SVC. Tiny pulmonary nodules are unchanged.  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 Also with probable occlusion of the IMA origin. Given this configuration the patient is at high risk for mesenteric ischemia and or mesenteric angina. No overt signs of ischemia on today's scan. Lactate correlation may be helpful. No signs of bowel obstruction. Bilateral adrenal masses. The left having a more benign appearance. Hyperenhancement and baseline hyperdensity of the right adrenal mass despite stability could be indicative of pheochromocytoma. Biochemical correlation may be helpful if  not yet performed. Cholelithiasis as before. Under distension versus gastric thickening correlate with any symptoms of gastritis. Emphysema and aortic atherosclerosis. Lucency of distal humerus may be related to artifact on the scout view. Correlate with any pain in this area and consider dedicated plain film assessment as warranted.  2/22 : HIDA Scan >> Normal GB  Echo 12/2018 EF 45-50%, LV  mildly decreased in function,   CULTURES:  03/25/2019>>SARS Coronavirus>> Negative  ANTIBIOTICS:    LINES/TUBES:  2/26>PIV x 1  CONSULTANTS:  03/25/2019>> Renal 03/25/2019 >> Surgery SUBJECTIVE:  Complaining of abdominal pain and diarrhea x 1 week PTA.  Pain is severe and is radiating to her chest Some relief with Fentanyl Hemodynamically stable at present on 3 L [place] Missed HD 2/25, as she states she could not get off the couch as she was too sick.  CONSTITUTIONAL: BP 135/73   Pulse (!) 124   Temp 98 F (36.7 C) (Oral)   Resp (!) 22   Ht 5\' 2"  (1.575 m)   Wt 49 kg   SpO2 100%   BMI 19.75 kg/m   No intake/output data recorded.    PHYSICAL EXAM: General: Awake and alert, in NAD Neuro:  A&O x 3, MAE x 4, appropriate HEENT:  NCAT, MM pink and dry,No LAD, Dressing to Right [place] area with HD access Cardiovascular:  S1, S2, Irr, A fib per tele, rate 118, NO MRG Lungs:  Bilateral chest excursion, Coarse throughout, diminished per bases, No wheezing noted at present time  Abdomen:  Guarded, Tender, Slightly distended, BS + Musculoskeletal:  No obvious abnormalities, muscle wasting  Skin:  Cool to touch, Dry and intact, no mottling, no rash or lesions  RESOLVED PROBLEM LIST   ASSESSMENT AND PLAN    GI bleed with Abdominal/ chest  pain and diarrhea x 1 week, nausea x 1 day  PTA with HGB drop to 5.1( Progressive anemia) 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/ 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 but CMA shows collateral circulation Recurrent issue ( just d/c 03/22/2019) Suspect Bowel ischemia per CTA Abdomen Pelvis Negative HIDA scan 03/21/2019 Lactic Acidosis Hx PUD Hemodynamically stable at present Plan Admit to SDU Surgical Consult called per ED MD>> they plan to monitor Trend Lactate Trend LFT's If worsens will consult vascular surgery Will consult GI for PUD and progressive anemia Tranfuse as is ordered CBC post transfusion Trend CBC MAP Goal of > 65 No  anticoagulants Check PT/ INR  ESRD  T/ Th/ Sat HD Missed HD 2/25 Home Lasix 20 non-HD days Plan Trend BMET Renal consulted for HD Replete electrolytes as needed  Minimize volume as able Strict I&O  PAF Non-anticoagulated Plan Tele monitoring Anticoagulation is not an option If rate becomes an issue can consider adding rate control Consider Cards consult  COPD Quit smoking 2019 Home Maintenance is Symbicort and Combivent Plan Saturation goals are 88-92% BD as ordered  Trend CXR prn  HTN On home Norvasc and lopressor and Pravachol  Plan Hold home meds as high risk for decompensation  Prn Apresoline for BP > 884 systolic Resume po meds once cleared per surgery    SUMMARY OF TODAY'S PLAN:  Trend Lactate GI Consult Transfuse blood and follow CBC  Best Practice / Goals of Care / Disposition.   DVT PROPHYLAXIS:Protonix gtt SUP: NUTRITION:NPO MOBILITY:BR GOALS OF CARE:NA FAMILY DISCUSSIONS: Pt. updated DISPOSITION ICU  LABS  Glucose No results for input(s): GLUCAP in the last 168 hours.  BMET Recent Labs  Lab 03/21/19 1029  03/22/19 0802 03/25/19 0611  NA 138 139 136  K 3.2* 3.3* 4.0  CL 98 100 98  CO2 23 22 16*  BUN 29* 40* 80*  CREATININE 3.80* 5.13* 5.62*  GLUCOSE 189* 230* 174*    Liver Enzymes Recent Labs  Lab 03/19/19 1400 03/19/19 1400 03/20/19 0300 03/21/19 1029 03/25/19 0611  AST 24  --  16  --  30  ALT 17  --  12  --  20  ALKPHOS 85  --  65  --  53  BILITOT 0.6  --  0.7  --  0.5  ALBUMIN 3.3*   < > 2.5* 2.5* 2.4*   < > = values in this interval not displayed.    Electrolytes Recent Labs  Lab 03/21/19 1029 03/22/19 0802 03/25/19 0611  CALCIUM 7.3* 7.4* 8.3*  MG  --  1.4*  --   PHOS 6.2*  --   --     CBC Recent Labs  Lab 03/21/19 1029 03/22/19 0802 03/25/19 0611  WBC 9.1 11.3* 10.8*  HGB 7.7* 7.8* 5.1*  HCT 25.2* 25.7* 16.8*  PLT 224 211 218    ABG No results for input(s): PHART, PCO2ART, PO2ART in the last  168 hours.  Coag's No results for input(s): APTT, INR in the last 168 hours.  Sepsis Markers Recent Labs  Lab 03/25/19 0816  LATICACIDVEN 2.7*    Cardiac Enzymes No results for input(s): TROPONINI, PROBNP in the last 168 hours.  PAST MEDICAL HISTORY :   She  has a past medical history of Anemia, Anxiety, Arthritis, Asthma, Atrial fibrillation (H[facility]), Chronic kidney disease, COPD (chronic obstructive pulmonary disease) ([place]), Coronary artery disease, Depression, GERD (gastroesophageal reflux disease), Gout, Headache, History of kidney stones, Hyperlipidemia, Hypertension, Pneumonia, and Small bowel obstruction ([place]).  PAST SURGICAL HISTORY:  She  has a past surgical history that includes Appendectomy; Abdominal hysterectomy; Hernia repair; Abdominal surgery; Facial reconstruction surgery; AV fistula placement (Left, 08/04/2018); Cardiac catheterization; Coronary angioplasty (?[id number]); and Bascilic vein transposition (Left, 11/24/2018).  Allergies  Allergen Reactions  . Cyclobenzaprine Anaphylaxis and Other (See Comments)    "stopped heart"   . Morphine Anaphylaxis    "stopped heart"  . Penicillins Shortness Of Breath, Swelling and Palpitations    Did it involve swelling of the face/tongue/throat, SOB, or low BP? Yes Did it involve sudden or severe rash/hives, skin peeling, or any reaction on the inside of your mouth or nose? No Did you need to seek medical attention at a hospital or doctor's office? No When did it last happen?years  If all above answers are "NO", may proceed with cephalosporin use.   . Ambien [Zolpidem] Other (See Comments)    Severe confusion  . Codeine Itching and Rash  . Hydromorphone Other (See Comments)    If administered quickly, felt like hand was "exploding"     No current facility-administered medications on file prior to encounter.   Current Outpatient Medications on File Prior to Encounter  Medication Sig  . lidocaine-prilocaine (EMLA)  cream Apply 1 application topically daily as needed (1-2 hours before dialysis (cover with occlusive dressing)).   Marland Kitchen albuterol (PROVENTIL) (2.5 MG/3ML) 0.083% nebulizer solution Take 2.5 mg by nebulization every 6 (six) hours as needed for wheezing or shortness of breath.  . allopurinol (ZYLOPRIM) 100 MG tablet Take 100 mg by mouth daily.  Marland Kitchen amLODipine (NORVASC) 10 MG tablet Take 10 mg by mouth daily.  Marland Kitchen aspirin EC 81 MG tablet Take 1 tablet (81 mg total) by mouth  daily.  . budesonide-formoterol (SYMBICORT) 160-4.5 MCG/ACT inhaler Inhale 2 puffs into the lungs every 4 (four) hours as needed (shortness of breath/wheezing).  . Carboxymethylcellul-Glycerin (LUBRICATING EYE DROPS OP) Place 1 drop into both eyes daily as needed (dry eyes).  . Cholecalciferol (VITAMIN D3) 50 MCG (2000 UT) capsule Take 2,000 Units by mouth daily.   Marland Kitchen dicyclomine (BENTYL) 10 MG capsule Take 20 mg by mouth at bedtime.   . docusate sodium (COLACE) 100 MG capsule Take 1 capsule (100 mg total) by mouth 2 (two) times daily.  Marland Kitchen doxylamine, Sleep, (UNISOM) 25 MG tablet Take 25 mg by mouth at bedtime as needed for sleep.  . ferric citrate (AURYXIA) 1 GM 210 MG(Fe) tablet Take 420-630 mg by mouth See admin instructions. Take 3 tablets (630 mg) by mouth three times daily with meals and 2 tablets (420 mg) with snacks  . furosemide (LASIX) 40 MG tablet 40 mg lasix on non-HD days (Patient taking differently: Take 20 mg by mouth See admin instructions. Take 1/2 tablet (20 mg) by mouth on Sunday, Monday, Wednesday, Friday (non-dialysis days))  . gabapentin (NEURONTIN) 300 MG capsule Take 1 capsule (300 mg total) by mouth at bedtime.  . Ipratropium-Albuterol (COMBIVENT) 20-100 MCG/ACT AERS respimat Inhale 2 puffs into the lungs 4 (four) times daily as needed for wheezing.  . isosorbide mononitrate (IMDUR) 30 MG 24 hr tablet Take 30 mg by mouth daily.  . metoprolol tartrate (LOPRESSOR) 100 MG tablet Take 1 tablet (100 mg total) by mouth 2  (two) times daily.  . nitroGLYCERIN (NITROSTAT) 0.4 MG SL tablet Place 0.4 mg under the tongue every 5 (five) minutes as needed for chest pain.   Marland Kitchen ondansetron (ZOFRAN) 4 MG tablet Take 4 mg by mouth 3 (three) times daily as needed for nausea.  . pantoprazole (PROTONIX) 40 MG tablet Take 1 tablet (40 mg total) by mouth daily.  . polyethylene glycol (MIRALAX / GLYCOLAX) 17 g packet Take 17 g by mouth 2 (two) times daily. (Patient taking differently: Take 17 g by mouth every other day. )  . pravastatin (PRAVACHOL) 80 MG tablet Take 80 mg by mouth every evening.   . sennosides-docusate sodium (SENOKOT-S) 8.6-50 MG tablet Take 1 tablet by mouth 2 (two) times daily.     FAMILY HISTORY:   Her family history is not on file.  SOCIAL HISTORY:  She  reports that she quit smoking about 2 years ago. She has a 25.00 pack-year smoking history. She has never used smokeless tobacco. She reports previous alcohol use. She reports previous drug use. Drug: Marijuana.  REVIEW OF SYSTEMS:     Gen: Denies fever, chills, weight change, +fatigue, No night sweats HEENT: Denies blurred vision, double vision, hearing loss, tinnitus, sinus congestion, rhinorrhea, sore throat, neck stiffness, dysphagia PULM: Denies shortness of breath, cough, sputum production, hemoptysis, wheezing CV: + chest pain, No edema, orthopnea, paroxysmal nocturnal dyspnea, palpitations GI: +  abdominal pain, + nausea, + vomiting, + diarrhea, hematochezia, + melena, No constipation, change in bowel habits GU: Denies dysuria, hematuria, polyuria, oliguria, urethral discharge Endocrine: Denies hot or cold intolerance, polyuria, polyphagia or appetite change Derm: Denies rash, dry skin, scaling or peeling skin change Heme: Denies easy bruising, bleeding, bleeding gums Neuro: Denies headache, numbness, weakness, slurred speech, loss of memory or consciousness  Magdalen Spatz, MSN, AGACNP-BC [place] Pager #  [phone] After 4 pm please call [phone] 03/25/2019 1:47 PM

## 2019-03-25 NOTE — ED Provider Notes (Signed)
[facility] EMERGENCY DEPARTMENT Provider Note   CSN: [id number] Arrival date & time: 03/25/19  0554     History Chief Complaint  Patient presents with  . Abdominal Pain    Christina [name] is a 69 y.o. female.  Patient presents to the emergency department by ambulance from home.  Patient complaining of abdominal pain and chest pain.  She reports that she has been having nausea and vomiting for a week.  She currently does dialysis Tuesday-Thursday-Saturday.  She missed dialysis yesterday because she was "too sick to go".        Past Medical History:  Diagnosis Date  . Anemia   . Anxiety   . Arthritis   . Asthma   . Atrial fibrillation ([place])   . Chronic kidney disease    Dialysis T/Th/Sa  started in March 2020  . COPD (chronic obstructive pulmonary disease) ([place])   . Coronary artery disease    2 stents  . Depression   . GERD (gastroesophageal reflux disease)   . Gout   . Headache    migraines  . History of kidney stones   . Hyperlipidemia   . Hypertension   . Pneumonia   . Small bowel obstruction [facility])     Patient Active Problem List   Diagnosis Date Noted  . RUQ pain   . Anemia due to chronic kidney disease, on chronic dialysis ([place])   . AF (paroxysmal atrial fibrillation) ([place])   . Chest pain 03/19/2019  . Hyperkalemia 02/28/2019  . Uremia 02/27/2019  . Volume overload 02/21/2019  . Abdominal pain 02/21/2019  . Non-compliance with renal dialysis ([place]) 02/21/2019  . Respiratory failure ([place]) 02/04/2019  . Acute and chr resp failure, unsp w hypoxia or hypercapnia ([place])   . Afib ([place]) 01/25/2019  . SBO (small bowel obstruction) ([place]) 08/10/2018  . Anemia due to chronic blood loss   . Heme positive stool   . Platelet inhibition due to Plavix   . Acute GI bleeding 05/06/2018  . ESRD (end stage renal disease) ([place]) 05/06/2018  . Anxiety 05/06/2018  . Bipolar affective ([place]) 05/06/2018  . CAD (coronary artery disease), native coronary  artery 05/06/2018  . COPD with chronic bronchitis ([place]) 05/06/2018  . CVA (cerebral vascular accident) ([place]) 05/06/2018  . Essential hypertension 05/06/2018  . Hyperlipidemia 05/06/2018    Past Surgical History:  Procedure Laterality Date  . ABDOMINAL HYSTERECTOMY    . ABDOMINAL SURGERY     for small bowel obstruction - x 2  . APPENDECTOMY    . AV FISTULA PLACEMENT Left 08/04/2018   Procedure: ARTERIOVENOUS (AV) FISTULA CREATION LEFT ARM;  Surgeon: Waynetta Sandy, MD;  Location: [facility];  Service: Vascular;  Laterality: Left;  . BASCILIC VEIN TRANSPOSITION Left 11/24/2018   Procedure: SECOND STAGE BASILIC VEIN TRANSPOSITION LEFT ARM;  Surgeon: Waynetta Sandy, MD;  Location: [facility];  Service: Vascular;  Laterality: Left;  . CARDIAC CATHETERIZATION    . CORONARY ANGIOPLASTY  ?2003/2004  . FACIAL RECONSTRUCTION SURGERY     x 2  . HERNIA REPAIR       OB History   No obstetric history on file.     No family history on file.  Social History   Tobacco Use  . Smoking status: Former Smoker    Packs/day: 0.50    Years: 50.00    Pack years: 25.00    Quit date: 2019    Years since quitting: 2.1  . Smokeless tobacco: Never Used  Substance Use Topics  . Alcohol use: Not Currently    Comment: quit 2019 - "I was a drunk"  . Drug use: Not Currently    Types: Marijuana    Comment: h/o drug use - "I was raised in the 60s"; last use 18 months ago; smokes marijuana periodically    Home Medications Prior to Admission medications   Medication Sig Start Date End Date Taking? Authorizing Provider  albuterol (PROVENTIL) (2.5 MG/3ML) 0.083% nebulizer solution Take 2.5 mg by nebulization every 6 (six) hours as needed for wheezing or shortness of breath.    [provider]  allopurinol (ZYLOPRIM) 100 MG tablet Take 100 mg by mouth daily. 02/18/18   [provider]  amLODipine (NORVASC) 10 MG tablet Take 10 mg by mouth daily.    [provider]    aspirin EC 81 MG tablet Take 1 tablet (81 mg total) by mouth daily. 03/22/19   Eugenie Filler, MD  budesonide-formoterol [facility]) 160-4.5 MCG/ACT inhaler Inhale 2 puffs into the lungs every 4 (four) hours as needed (shortness of breath/wheezing).    [provider]  Carboxymethylcellul-Glycerin (LUBRICATING EYE DROPS OP) Place 1 drop into both eyes daily as needed (dry eyes).    [provider]  Cholecalciferol (VITAMIN D3) 50 MCG (2000 UT) capsule Take 2,000 Units by mouth daily.     [provider]  dicyclomine (BENTYL) 10 MG capsule Take 20 mg by mouth at bedtime.  08/27/17   [provider]  docusate sodium (COLACE) 100 MG capsule Take 1 capsule (100 mg total) by mouth 2 (two) times daily. 08/11/18   Hongalgi, Lenis Dickinson, MD  doxylamine, Sleep, (UNISOM) 25 MG tablet Take 25 mg by mouth at bedtime as needed for sleep.    [provider]  ferric citrate (AURYXIA) 1 GM 210 MG(Fe) tablet Take 420-630 mg by mouth See admin instructions. Take 3 tablets (630 mg) by mouth three times daily with meals and 2 tablets (420 mg) with snacks    [provider]  furosemide (LASIX) 40 MG tablet 40 mg lasix on non-HD days Patient taking differently: Take 20 mg by mouth See admin instructions. Take 1/2 tablet (20 mg) by mouth on Sunday, Monday, Wednesday, Friday (non-dialysis days) 03/01/19   Geradine Girt, DO  gabapentin (NEURONTIN) 300 MG capsule Take 1 capsule (300 mg total) by mouth at bedtime. 02/23/19   Mercy Riding, MD  Ipratropium-Albuterol (COMBIVENT) 20-100 MCG/ACT AERS respimat Inhale 2 puffs into the lungs 4 (four) times daily as needed for wheezing. 09/11/17   [provider]  isosorbide mononitrate (IMDUR) 30 MG 24 hr tablet Take 30 mg by mouth daily. 08/17/18   [provider]  lidocaine-prilocaine (EMLA) cream Apply 1 application topically daily as needed (1-2 hours before dialysis (cover with occlusive dressing)).  09/03/18    [provider]  metoprolol tartrate (LOPRESSOR) 100 MG tablet Take 1 tablet (100 mg total) by mouth 2 (two) times daily. 01/26/19   Little Ishikawa, MD  nitroGLYCERIN (NITROSTAT) 0.4 MG SL tablet Place 0.4 mg under the tongue every 5 (five) minutes as needed for chest pain.  09/18/16   [provider]  ondansetron (ZOFRAN) 4 MG tablet Take 4 mg by mouth 3 (three) times daily as needed for nausea. 09/14/18   [provider]  pantoprazole (PROTONIX) 40 MG tablet Take 1 tablet (40 mg total) by mouth daily. 03/22/19   Eugenie Filler, MD  polyethylene glycol (MIRALAX / GLYCOLAX) 17 g  packet Take 17 g by mouth 2 (two) times daily. Patient taking differently: Take 17 g by mouth every other day.  08/11/18   Hongalgi, Lenis Dickinson, MD  pravastatin (PRAVACHOL) 80 MG tablet Take 80 mg by mouth every evening.  06/30/16   [provider]  sennosides-docusate sodium (SENOKOT-S) 8.6-50 MG tablet Take 1 tablet by mouth 2 (two) times daily.     [provider]    Allergies    Cyclobenzaprine, Morphine, Penicillins, Ambien [zolpidem], Codeine, and Hydromorphone  Review of Systems   Review of Systems  Physical Exam Updated Vital Signs BP (!) 141/83 (BP Location: Right Arm)   Pulse (!) 114   Temp 98.2 F (36.8 C) (Oral)   Resp (!) 24   Ht 5\' 2"  (1.575 m)   Wt 49 kg   SpO2 100%   BMI 19.75 kg/m   Physical Exam  ED Results / Procedures / Treatments   Labs (all labs ordered are listed, but only abnormal results are displayed) Labs Reviewed  CBC WITH DIFFERENTIAL/PLATELET - Abnormal; Notable for the following components:      Result Value   WBC 10.8 (*)    RBC 1.68 (*)    Hemoglobin 5.1 (*)    HCT 16.8 (*)    RDW 15.9 (*)    Neutro Abs 8.1 (*)    Abs Immature Granulocytes 0.14 (*)    All other components within normal limits  COMPREHENSIVE METABOLIC PANEL  LACTIC ACID, PLASMA  LIPASE, BLOOD  RAPID URINE DRUG SCREEN, HOSP PERFORMED  URINALYSIS,  ROUTINE W REFLEX MICROSCOPIC  TYPE AND SCREEN  TROPONIN I (HIGH SENSITIVITY)    EKG EKG Interpretation  Date/Time:  Friday March 25 2019 06:33:42 EST Ventricular Rate:  113 PR Interval:    QRS Duration: 86 QT Interval:  302 QTC Calculation: 414 R Axis:   73 Text Interpretation: Atrial fibrillation Abnormal R-wave progression, early transition Repol abnrm, severe global ischemia (LM/MVD) Confirmed by Orpah Greek [phone]) on 03/25/2019 6:44:00 AM   Radiology No results found.  Procedures Procedures (including critical care time)  CRITICAL CARE Performed by: Orpah Greek   Total critical care time: 35 minutes  Critical care time was exclusive of separately billable procedures and treating other patients.  Critical care was necessary to treat or prevent imminent or life-threatening deterioration.  Critical care was time spent personally by me on the following activities: development of treatment plan with patient and/or surrogate as well as nursing, discussions with consultants, evaluation of patient's response to treatment, examination of patient, obtaining history from patient or surrogate, ordering and performing treatments and interventions, ordering and review of laboratory studies, ordering and review of radiographic studies, pulse oximetry and re-evaluation of patient's condition.   Medications Ordered in ED Medications - No data to display  ED Course  I have reviewed the triage vital signs and the nursing notes.  Pertinent labs & imaging results that were available during my care of the patient were reviewed by me and considered in my medical decision making (see chart for details).    MDM Rules/Calculators/A&P                      Patient presents to the emergency department with complaints of chest and abdominal pain.  Patient was recently hospitalized with similar.  During that time gallbladder disease was considered.  She ruled out with  HIDA scan, however.  Reviewing records reveals that the patient does have a history of  CAD and previous MI.  EKG reveals atrial fibrillation with rapid ventricular response with diffuse ischemia.  Reviewing her records does reveal history of paroxysmal atrial fibrillation, however during most recent hospital stay she was in sinus rhythm.  When she was admitted to the hospital previously, she had CT angiography performed to rule out aortic dissection.  No aortic abnormality was noted but she did have documented severe atherosclerotic disease in the abdomen.  Blood work is starting to return and she does have significant anemia with a hemoglobin of 5.1.  With new atrial fibrillation, not on anticoagulation, also consider mesenteric ischemia.  Will need to repeat CT angiography of abdomen.    Will cycle cardiac enzymes.  No anticoagulation for atrial fibrillation because her hemoglobin is 5.1 and has a history of GI bleed.  Type and screen and transfuse.  Will sign out to oncoming ER physician.  CHA2DS2-VASc Score = 7 The patient's score is based upon: CHF History: Yes HTN History: Yes Age : 12-74 Diabetes History: No Stroke History: Yes Vascular Disease History: Yes Gender: Female      ASSESSMENT AND PLAN: Paroxysmal Atrial Fibrillation (ICD10:  I48.0) The patient's CHA2DS2-VASc score is 7, indicating a 11.2% annual risk of stroke.    Secondary Hypercoagulable State (ICD10:  D68.69) The patient is at significant risk for stroke/thromboembolism based upon her CHA2DS2-VASc Score of 7.  However, the patient is not on anticoagulation due to her high bleeding risk.      Signed,  Orpah Greek, MD    03/25/2019 7:02 AM   Final Clinical Impression(s) / ED Diagnoses Final diagnoses:  Generalized abdominal pain  Chest pain, unspecified type  Atrial fibrillation with RVR [facility])    Rx / DC Orders ED Discharge Orders    None       Orpah Greek, MD 03/25/19 [phone]

## 2019-03-25 NOTE — Progress Notes (Signed)
Subjective: Chief Complaint: Abdominal pain, nausea, vomiting, and diarrhea  Patient known to our group.  She seen previously for concerns of a calculus cholecystitis with a negative HIDA scan.  There was some concerns for peptic ulcer disease given the duodenal thickening seen on prior CT scans.  Patient presented to the emergency department overnight for abdominal pain.  She reports that the day after she arrived home after being discharged on 2/23 she began having periumbilical and epigastric abdominal pain without radiation that occurred after eating.  She is unsure how to describe the pain or for how long it lasted.  She reports the last 24 hours she has had nausea as well as 6 episodes of nonbilious, nonbloody emesis with the last episode yesterday afternoon.  She also reports 10+ episodes of loose stools that are black and tarry, however she reports that she has had black/tarry stools since starting on iron sometime ago.  She denies any gross hematochezia or maroon-colored stools.  She reports her pain is no different than her prior admission.  No better or worse.  She denies any fever or chills.  In the ED today she was noted to be tachycardic with heart rates up to 124.  Telemetry showed A. fib.  She does have a history of proximal A. fib.  She was afebrile.  She was noted to have episodes of tachypnea as well as hypoxia.  BP as low as 112/96.  Currently 140/77.  Lab work significant for WBC of 10.8.  Hemoglobin 5.1, down from 7.8 on 2/23.  She is currently receiving her second unit of PRBC.  Lactic acid 2.7.  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  Results report patient is a high risk for mesenteric ischemia.  There is no overt signs of ischemia on today's scan.  No pneumoperitoneum or free air.  Gastric thickening noted on scan.  CCM to admit.  We are  asked to consult.  Patient does have a history of hypertension, hyperlipidemia, CAD (stent x2), COPD, paroxysmal A. fib, ESRD (Tuesday/Thursday/Saturday) for which she missed her last session.  Prior abdominal surgeries include laparoscopic appendectomy, abdominal hysterectomy, hernia repair, and ex lap with small bowel resection for SBO.  ROS: See above, otherwise other systems negative   Active Ambulatory Problems    Diagnosis Date Noted  . Acute GI bleeding 05/06/2018  . ESRD (end stage renal disease) ([place]) 05/06/2018  . Anxiety 05/06/2018  . Bipolar affective ([place]) 05/06/2018  . CAD (coronary artery disease), native coronary artery 05/06/2018  . COPD with chronic bronchitis ([place]) 05/06/2018  . CVA (cerebral vascular accident) ([place]) 05/06/2018  . Essential hypertension 05/06/2018  . Hyperlipidemia 05/06/2018  . Anemia due to chronic blood loss   . Heme positive stool   . Platelet inhibition due to Plavix   . SBO (small bowel obstruction) ([place]) 08/10/2018  . Afib ([place]) 01/25/2019  . Respiratory failure ([place]) 02/04/2019  . Acute and chr resp failure, unsp w hypoxia or hypercapnia ([place])   . Volume overload 02/21/2019  . Abdominal pain 02/21/2019  . Non-compliance with renal dialysis ([place]) 02/21/2019  . Uremia 02/27/2019  . Hyperkalemia 02/28/2019  . Chest pain 03/19/2019  . RUQ pain   . Anemia due to chronic kidney disease, on chronic dialysis ([place])   . AF (paroxysmal atrial fibrillation) ([place])    Resolved Ambulatory  Problems    Diagnosis Date Noted  . New onset a-fib ([place]) 01/25/2019   Past Medical History:  Diagnosis Date  . Anemia   . Arthritis   . Asthma   . Atrial fibrillation ([place])   . Chronic kidney disease   . COPD (chronic obstructive pulmonary disease) ([place])   . Coronary artery disease   . Depression   . GERD (gastroesophageal reflux disease)   . Gout   . Headache   . History of kidney stones   . Hypertension   . Pneumonia   . Small bowel obstruction  [facility])    Past Surgical History:  Procedure Laterality Date  . ABDOMINAL HYSTERECTOMY    . ABDOMINAL SURGERY     for small bowel obstruction - x 2  . APPENDECTOMY    . AV FISTULA PLACEMENT Left 08/04/2018   Procedure: ARTERIOVENOUS (AV) FISTULA CREATION LEFT ARM;  Surgeon: Waynetta Sandy, MD;  Location: [place];  Service: Vascular;  Laterality: Left;  . BASCILIC VEIN TRANSPOSITION Left 11/24/2018   Procedure: SECOND STAGE BASILIC VEIN TRANSPOSITION LEFT ARM;  Surgeon: Waynetta Sandy, MD;  Location: [place];  Service: Vascular;  Laterality: Left;  . CARDIAC CATHETERIZATION    . CORONARY ANGIOPLASTY  ?2003/2004  . FACIAL RECONSTRUCTION SURGERY     x 2  . HERNIA REPAIR     Social History   Socioeconomic History  . Marital status: Single    Spouse name: Not on file  . Number of children: Not on file  . Years of education: Not on file  . Highest education level: Not on file  Occupational History  . Occupation: retired  Tobacco Use  . Smoking status: Former Smoker    Packs/day: 0.50    Years: 50.00    Pack years: 25.00    Quit date: 2019    Years since quitting: 2.1  . Smokeless tobacco: Never Used  Substance and Sexual Activity  . Alcohol use: Not Currently    Comment: quit 2019 - "I was a drunk"  . Drug use: Not Currently    Types: Marijuana    Comment: h/o drug use - "I was raised in the 60s"; last use 18 months ago; smokes marijuana periodically  . Sexual activity: Not on file  Other Topics Concern  . Not on file  Social History Narrative  . Not on file   Social Determinants of Health   Financial Resource Strain:   . Difficulty of Paying Living Expenses: Not on file  Food Insecurity:   . Worried About Charity fundraiser in the Last Year: Not on file  . Ran Out of Food in the Last Year: Not on file  Transportation Needs:   . Lack of Transportation (Medical): Not on file  . Lack of Transportation (Non-Medical): Not on file  Physical Activity:   .  Days of Exercise per Week: Not on file  . Minutes of Exercise per Session: Not on file  Stress:   . Feeling of Stress : Not on file  Social Connections:   . Frequency of Communication with Friends and Family: Not on file  . Frequency of Social Gatherings with Friends and Family: Not on file  . Attends Religious Services: Not on file  . Active Member of Clubs or Organizations: Not on file  . Attends Archivist Meetings: Not on file  . Marital Status: Not on file  Intimate Partner Violence:   . Fear of Current or Ex-Partner: Not on  file  . Emotionally Abused: Not on file  . Physically Abused: Not on file  . Sexually Abused: Not on file   No family history on file.    Objective: Vital signs in last 24 hours: Temp:  [97.6 F (36.4 C)-98.6 F (37 C)] 98.6 F (37 C) (02/26 1115) Pulse Rate:  [49-124] 118 (02/26 1111) Resp:  [8-34] 18 (02/26 1111) BP: (112-156)/(69-109) 140/77 (02/26 1115) SpO2:  [64 %-100 %] 100 % (02/26 1111) Weight:  [49 kg] 49 kg (02/26 0621)    Intake/Output from previous day: No intake/output data recorded. Intake/Output this shift: Total I/O In: 630 [Blood:630] Out: -   Physical Exam: General: Frail, elderly female who appears older than stated age, lying in bed in no acute distress.   HEENT: head is normocephalic, atraumatic.  Sclera are noninjected.  Mouth is dry.  Poor dentition Heart: Tachycardic  Lungs: Distant breath sounds with tachypnea noted.  On O2  Abd: Soft, nondistended, epigastric tenderness without involuntary guarding, rigidity or rebound.  She does have occasional voluntary guarding over the epigastrium but this is inconsistent and does not happened every time that I palpate in this area.  There is no masses, hernias or organomegaly.  Patient with prior laparoscopic as well as midline abdominal scars noted that are well-healed.   MS: No lower extremity edema.  Moves all extremities. Skin: warm and dry  Psych: A&Ox3,  appropriate affect  Lab Results:  Recent Labs    03/25/19 0611  WBC 10.8*  HGB 5.1*  HCT 16.8*  PLT 218   BMET Recent Labs    03/25/19 0611  NA 136  K 4.0  CL 98  CO2 16*  GLUCOSE 174*  BUN 80*  CREATININE 5.62*  CALCIUM 8.3*   PT/INR No results for input(s): LABPROT, INR in the last 72 hours. CMP     Component Value Date/Time   NA 136 03/25/2019 0611   K 4.0 03/25/2019 0611   CL 98 03/25/2019 0611   CO2 16 (L) 03/25/2019 0611   GLUCOSE 174 (H) 03/25/2019 0611   BUN 80 (H) 03/25/2019 0611   CREATININE 5.62 (H) 03/25/2019 0611   CALCIUM 8.3 (L) 03/25/2019 0611   PROT 4.7 (L) 03/25/2019 0611   ALBUMIN 2.4 (L) 03/25/2019 0611   AST 30 03/25/2019 0611   ALT 20 03/25/2019 0611   ALKPHOS 53 03/25/2019 0611   BILITOT 0.5 03/25/2019 0611   GFRNONAA 7 (L) 03/25/2019 0611   GFRAA 8 (L) 03/25/2019 0611   Lipase     Component Value Date/Time   LIPASE 64 (H) 03/25/2019 0611       Studies/Results: DG Chest Port 1 View  Result Date: 03/25/2019 CLINICAL DATA:  Weakness with abdominal pain. EXAM: PORTABLE CHEST 1 VIEW COMPARISON:  03/19/2019 FINDINGS: 7:50 a.m. Cardiopericardial silhouette is at upper limits of normal for size. The lungs are clear without focal pneumonia, edema, pneumothorax or pleural effusion. Interstitial markings are diffusely coarsened with chronic features. Right-sided central line tip is stable, positioned over the upper right atrium. The visualized bony structures of the thorax are intact. Telemetry leads overlie the chest. IMPRESSION: Stable.  No acute findings. Electronically Signed   By: Misty Stanley M.D.   On: 03/25/2019 08:26   CT Angio Chest/Abd/Pel for Dissection W and/or Wo Contrast  Addendum Date: 03/25/2019   ADDENDUM REPORT: 03/25/2019 09:28 ADDENDUM: These results were called by telephone at the time of interpretation on 03/25/2019 at 9:28 am to provider [facility] , who verbally  acknowledged these results. Electronically Signed    By: Zetta Bills M.D.   On: 03/25/2019 09:28   Result Date: 03/25/2019 CLINICAL DATA:  Abdominal pain, acute with nausea and vomiting for 1 week. EXAM: CT ANGIOGRAPHY CHEST, ABDOMEN AND PELVIS TECHNIQUE: Multidetector CT imaging through the chest, abdomen and pelvis was performed using the standard protocol during bolus administration of intravenous contrast. Multiplanar reconstructed images and MIPs were obtained and reviewed to evaluate the vascular anatomy. CONTRAST:  39mL OMNIPAQUE IOHEXOL 350 MG/ML SOLN COMPARISON:  03/19/2019 and multiple prior studies. FINDINGS: CTA CHEST FINDINGS Cardiovascular: No signs of intramural hematoma within the aorta on noncontrast imaging. Low-attenuation cardiac blood pool compatible with anemia. Right sided catheter with small associated thrombus or fibrin sheath (image 39, series 8) findings are similar to the previous exam with low-attenuation crescent around the catheter in the proximal SVC. Abundant atherosclerotic plaque of the thoracic aorta both calcified but predominantly noncalcified without interval change with multiple areas of plaque irregularity but without signs of dissection. Patent great vessels in the chest. Heart size remains enlarged with extensive coronary artery disease, similar to the prior study. No signs of pericardial effusion. Mediastinum/Nodes: Scattered lymph nodes about the mediastinum without change. No axillary lymphadenopathy. No thoracic inlet lymphadenopathy. Lungs/Pleura: No signs of consolidation or evidence of pleural effusion. Airways are patent. Signs of pulmonary emphysema with centrilobular predominance as noted on the previous study. Airways are patent. Scattered 2-3 mm pulmonary nodules with similar appearance most notably in the right upper lobe. Musculoskeletal: No signs of acute bone finding or evidence of destructive bone process. No chest wall mass. See below for for full detail. Review of the MIP images confirms the above  findings. CTA ABDOMEN AND PELVIS FINDINGS VASCULAR Aorta: Abundant plaque in the abdominal aorta becoming less severe in the distal aorta. Caliber at the aortic hiatus 2.5 x 2.3 cm. Just below the renal arteries 2.5 by 2.4 cm. Distal aorta just above the iliac bifurcation 2.4 x 2.2 cm. No signs of dissection in the abdominal aorta. 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 Collateral pathways arising from the SMA. Vessels in the celiac distribution remain attenuated but patent. SMA: SMA remains attenuated/narrowed with small lumen approximately 1 cm from its origin. Lumen between 2 and 3 mm at this level where as the origin with lumen approximately 5 mm, 7 mm presumed poststenotic dilation just beyond the area of narrowing as before. This shows patency distally but is again supplying the celiac distribution as well. No change since the prior study in this appearance. Renals: Small renal arteries bilaterally with attenuated vessel on the left supplying a scarred left kidney. Right renal artery with abundant plaque at the origin, vessel attenuated distally as before. IMA: IMA is not visualized. Distal aspect of the vessel with some branches that may show minimal opacification suggest collateral pathways. Inflow: Patent without evidence of aneurysm, dissection, vasculitis or significant stenosis. Veins: Limited assessment of venous structures on arterial phase. Review of the MIP images confirms the above findings. NON-VASCULAR Hepatobiliary: Liver is unremarkable. No signs of pericholecystic stranding. Gallstones layer in the gallbladder. No biliary ductal dilation. Pancreas: Pancreas unremarkable, no signs of peripancreatic inflammation or ductal dilation. Spleen: Spleen is normal. Adrenals/Urinary Tract: Adrenal masses bilaterally, on the left measuring 2.7 x 2.3 cm, on the right measuring 2.6 x 1.4 cm as compared to 2.5 x 1.9 cm. Lesions are  similar over time accounting for differences in measurement technique dating  back to July of 2020. The right adrenal mass is hyperenhancing on arterial phase more so than the left. Cortical scarring is evident bilaterally with renal vascular calcifications and presumed cysts, also not changed. Urinary bladder is unremarkable. No signs of hydronephrosis. Stomach/Bowel: Under distension versus gastric thickening. Moderate-sized duodenal diverticulum. No signs of bowel obstruction. Post small bowel resection in the left hemiabdomen. Colon is partially fluid-filled. Lymphatic: No signs of abdominal adenopathy. No signs of pelvic lymphadenopathy. Reproductive: Post hysterectomy. Other: No free air. No significant hernia or ascites. Musculoskeletal: No acute bone finding or destructive bone process. Review of the MIP images confirms the above findings. IMPRESSION: CTA CHEST 1. No evidence of acute intramural hematoma or dissection involving the thoracic aorta. 2. Abundant atherosclerotic plaque of the thoracic aorta both calcified but predominantly noncalcified. 3. Cardiomegaly with extensive coronary artery disease. 4. Fibrin sheath or small amount of adherent thrombus along the right IJ catheter in the proximal SVC. 5. Tiny pulmonary nodules are unchanged. CTA ABDOMEN AND PELVIS 1. 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 Also with probable occlusion of the IMA origin. Given this configuration the patient is at high risk for mesenteric ischemia and or mesenteric angina. No overt signs of ischemia on today's scan. Lactate correlation may be helpful. 2. No signs of bowel obstruction. 3. Bilateral adrenal masses. The left having a more benign appearance. Hyperenhancement and baseline hyperdensity of the right adrenal mass despite stability could be indicative of pheochromocytoma. Biochemical correlation may be helpful if not yet  performed. 4. Cholelithiasis as before. 5. Under distension versus gastric thickening correlate with any symptoms of gastritis. 6. Emphysema and aortic atherosclerosis. 7. Lucency of distal humerus may be related to artifact on the scout view. Correlate with any pain in this area and consider dedicated plain film assessment as warranted. 8. Call is out to the referring provider to further discuss findings in the above case. Aortic Atherosclerosis (ICD10-I70.0) and Emphysema (ICD10-J43.9). Electronically Signed: By: Zetta Bills M.D. On: 03/25/2019 09:19    Anti-infectives: Anti-infectives (From admission, onward)   None      Assessment/Plan Hypertension Hyperlipidemia CAD (stent x2) COPD Paroxysmal A. 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  She seen previously for concerns of a calculus cholecystitis with a negative HIDA scan.  There was some concerns for peptic ulcer disease given the duodenal thickening seen on prior CT scans.  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  Results report patient is a high risk for mesenteric ischemia.  There is no overt signs of ischemia on today's scan.  No pneumoperitoneum or free air.  Lactic acid is noted to be elevated at 2.7, however patient appears very dry on exam.  Her pain is not out of proportion on exam and she is without peritonitis.  No role for acute surgical intervention at this time. We will follow closely with serial abdominal exams, monitoring of lab work (lactic acid etc) and itals. Primary team could discuss with vascular to see if there is any options for chronic above findings. - Gastric thickening noted on scan.  Her duodenum was noted be  thickened with adjacent fat stranding on 02/27/2019 CT.  Recommend reconsultation to GI given continued concerns for PUD.  She may benefit from upper endoscopy. - Dr. Redmond Pulling also to evaluate. Further recs to follow.   FEN -n.p.o. VTE -SCDs ID -none   LOS: 0 days    Jillyn Ledger , [facility] 03/25/2019, 11:46 AM Please see Amion for pager number during day hours 7:00am-4:30pm

## 2019-03-25 NOTE — Plan of Care (Signed)

## 2019-03-25 NOTE — ED Notes (Signed)
Level of care decreased to progressive care. Med ICU notified and waiting on progressive bed.

## 2019-03-25 NOTE — ED Notes (Signed)
Bladder scanner completed, total volume 315mL

## 2019-03-25 NOTE — ED Triage Notes (Signed)
Patient presents to ED vai [facility] c/o abd. Pain with n/v x 1 week , states she last had dialysis on tues. Was to sick to go yest.

## 2019-03-25 NOTE — Progress Notes (Signed)
ABG results not in showing up. PH 7.40 CO234 O2 431 Hco3 21 on NRB. Placed patient on 3lpm [place] [place] after ABG.

## 2019-03-25 NOTE — Progress Notes (Signed)
Subjective: ABM pain, but it has improved.  Objective: Vital signs in last 24 hours: Temp:  [97.6 F (36.4 C)-98.6 F (37 C)] 98.1 F (36.7 C) (02/26 1603) Pulse Rate:  [49-124] 95 (02/26 1603) Resp:  [8-34] 19 (02/26 1603) BP: (112-170)/(69-109) 149/88 (02/26 1603) SpO2:  [64 %-100 %] 96 % (02/26 1603) Weight:  [49 kg] 49 kg (02/26 0621)    Intake/Output from previous day: No intake/output data recorded. Intake/Output this shift: Total I/O In: 945 [Blood:945] Out: -   General appearance: alert and no distress GI: tender in the mid to left lower abdomen  Lab Results: Recent Labs    03/25/19 0611  WBC 10.8*  HGB 5.1*  HCT 16.8*  PLT 218   BMET Recent Labs    03/25/19 0611  NA 136  K 4.0  CL 98  CO2 16*  GLUCOSE 174*  BUN 80*  CREATININE 5.62*  CALCIUM 8.3*   LFT Recent Labs    03/25/19 0611  PROT 4.7*  ALBUMIN 2.4*  AST 30  ALT 20  ALKPHOS 53  BILITOT 0.5   PT/INR Recent Labs    03/25/19 1313  LABPROT 14.3  INR 1.1   Hepatitis Panel No results for input(s): HEPBSAG, HCVAB, HEPAIGM, HEPBIGM in the last 72 hours. C-Diff No results for input(s): CDIFFTOX in the last 72 hours. Fecal Lactopherrin No results for input(s): FECLLACTOFRN in the last 72 hours.  Studies/Results: DG Chest Port 1 View  Result Date: 03/25/2019 CLINICAL DATA:  Weakness with abdominal pain. EXAM: PORTABLE CHEST 1 VIEW COMPARISON:  03/19/2019 FINDINGS: 7:50 a.m. Cardiopericardial silhouette is at upper limits of normal for size. The lungs are clear without focal pneumonia, edema, pneumothorax or pleural effusion. Interstitial markings are diffusely coarsened with chronic features. Right-sided central line tip is stable, positioned over the upper right atrium. The visualized bony structures of the thorax are intact. Telemetry leads overlie the chest. IMPRESSION: Stable.  No acute findings. Electronically Signed   By: Misty Stanley M.D.   On: 03/25/2019 08:26   CT Angio  Chest/Abd/Pel for Dissection W and/or Wo Contrast  Addendum Date: 03/25/2019   ADDENDUM REPORT: 03/25/2019 09:28 ADDENDUM: These results were called by telephone at the time of interpretation on 03/25/2019 at 9:28 am to provider [facility] RAY , who verbally acknowledged these results. Electronically Signed   By: Zetta Bills M.D.   On: 03/25/2019 09:28   Result Date: 03/25/2019 CLINICAL DATA:  Abdominal pain, acute with nausea and vomiting for 1 week. EXAM: CT ANGIOGRAPHY CHEST, ABDOMEN AND PELVIS TECHNIQUE: Multidetector CT imaging through the chest, abdomen and pelvis was performed using the standard protocol during bolus administration of intravenous contrast. Multiplanar reconstructed images and MIPs were obtained and reviewed to evaluate the vascular anatomy. CONTRAST:  61mL OMNIPAQUE IOHEXOL 350 MG/ML SOLN COMPARISON:  03/19/2019 and multiple prior studies. FINDINGS: CTA CHEST FINDINGS Cardiovascular: No signs of intramural hematoma within the aorta on noncontrast imaging. Low-attenuation cardiac blood pool compatible with anemia. Right sided catheter with small associated thrombus or fibrin sheath (image 39, series 8) findings are similar to the previous exam with low-attenuation crescent around the catheter in the proximal SVC. Abundant atherosclerotic plaque of the thoracic aorta both calcified but predominantly noncalcified without interval change with multiple areas of plaque irregularity but without signs of dissection. Patent great vessels in the chest. Heart size remains enlarged with extensive coronary artery disease, similar to the prior study. No signs of pericardial effusion. Mediastinum/Nodes: Scattered lymph nodes about the mediastinum without  change. No axillary lymphadenopathy. No thoracic inlet lymphadenopathy. Lungs/Pleura: No signs of consolidation or evidence of pleural effusion. Airways are patent. Signs of pulmonary emphysema with centrilobular predominance as noted on the previous  study. Airways are patent. Scattered 2-3 mm pulmonary nodules with similar appearance most notably in the right upper lobe. Musculoskeletal: No signs of acute bone finding or evidence of destructive bone process. No chest wall mass. See below for for full detail. Review of the MIP images confirms the above findings. CTA ABDOMEN AND PELVIS FINDINGS VASCULAR Aorta: Abundant plaque in the abdominal aorta becoming less severe in the distal aorta. Caliber at the aortic hiatus 2.5 x 2.3 cm. Just below the renal arteries 2.5 by 2.4 cm. Distal aorta just above the iliac bifurcation 2.4 x 2.2 cm. No signs of dissection in the abdominal aorta. 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 Collateral pathways arising from the SMA. Vessels in the celiac distribution remain attenuated but patent. SMA: SMA remains attenuated/narrowed with small lumen approximately 1 cm from its origin. Lumen between 2 and 3 mm at this level where as the origin with lumen approximately 5 mm, 7 mm presumed poststenotic dilation just beyond the area of narrowing as before. This shows patency distally but is again supplying the celiac distribution as well. No change since the prior study in this appearance. Renals: Small renal arteries bilaterally with attenuated vessel on the left supplying a scarred left kidney. Right renal artery with abundant plaque at the origin, vessel attenuated distally as before. IMA: IMA is not visualized. Distal aspect of the vessel with some branches that may show minimal opacification suggest collateral pathways. Inflow: Patent without evidence of aneurysm, dissection, vasculitis or significant stenosis. Veins: Limited assessment of venous structures on arterial phase. Review of the MIP images confirms the above findings. NON-VASCULAR Hepatobiliary: Liver is unremarkable. No signs of pericholecystic stranding. Gallstones layer in the gallbladder. No  biliary ductal dilation. Pancreas: Pancreas unremarkable, no signs of peripancreatic inflammation or ductal dilation. Spleen: Spleen is normal. Adrenals/Urinary Tract: Adrenal masses bilaterally, on the left measuring 2.7 x 2.3 cm, on the right measuring 2.6 x 1.4 cm as compared to 2.5 x 1.9 cm. Lesions are similar over time accounting for differences in measurement technique dating back to July of 2020. The right adrenal mass is hyperenhancing on arterial phase more so than the left. Cortical scarring is evident bilaterally with renal vascular calcifications and presumed cysts, also not changed. Urinary bladder is unremarkable. No signs of hydronephrosis. Stomach/Bowel: Under distension versus gastric thickening. Moderate-sized duodenal diverticulum. No signs of bowel obstruction. Post small bowel resection in the left hemiabdomen. Colon is partially fluid-filled. Lymphatic: No signs of abdominal adenopathy. No signs of pelvic lymphadenopathy. Reproductive: Post hysterectomy. Other: No free air. No significant hernia or ascites. Musculoskeletal: No acute bone finding or destructive bone process. Review of the MIP images confirms the above findings. IMPRESSION: CTA CHEST 1. No evidence of acute intramural hematoma or dissection involving the thoracic aorta. 2. Abundant atherosclerotic plaque of the thoracic aorta both calcified but predominantly noncalcified. 3. Cardiomegaly with extensive coronary artery disease. 4. Fibrin sheath or small amount of adherent thrombus along the right IJ catheter in the proximal SVC. 5. Tiny pulmonary nodules are unchanged. CTA ABDOMEN AND PELVIS 1. 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 Also with probable occlusion of the IMA origin. Given this configuration the patient is at  high risk for mesenteric ischemia and or mesenteric angina. No overt signs of ischemia on today's scan. Lactate  correlation may be helpful. 2. No signs of bowel obstruction. 3. Bilateral adrenal masses. The left having a more benign appearance. Hyperenhancement and baseline hyperdensity of the right adrenal mass despite stability could be indicative of pheochromocytoma. Biochemical correlation may be helpful if not yet performed. 4. Cholelithiasis as before. 5. Under distension versus gastric thickening correlate with any symptoms of gastritis. 6. Emphysema and aortic atherosclerosis. 7. Lucency of distal humerus may be related to artifact on the scout view. Correlate with any pain in this area and consider dedicated plain film assessment as warranted. 8. Call is out to the referring provider to further discuss findings in the above case. Aortic Atherosclerosis (ICD10-I70.0) and Emphysema (ICD10-J43.9). Electronically Signed: By: Zetta Bills M.D. On: 03/25/2019 09:19    Medications:  Scheduled: . arformoterol  15 mcg Nebulization BID  . budesonide (PULMICORT) nebulizer solution  0.5 mg Nebulization BID  . [START ON 03/26/2019] Chlorhexidine Gluconate Cloth  6 each Topical Q0600  . [START ON 03/26/2019] doxercalciferol  2 mcg Intravenous Q T,Th,Sa-HD  . ipratropium-albuterol  3 mL Nebulization Q6H  . pantoprazole (PROTONIX) IV  40 mg Intravenous QHS   Continuous: . sodium chloride 10 mL/hr at 03/25/19 1607    Assessment/Plan: 1) Anemia. 2) Melena. 3) Peripheral vascular disease.   The presentation of the patient with melena, mid to left sided abdominal pain, and anemia can be consistent with PUD.  The pretest probability is higher for a case like this one, but she may also have a component of mesenteric ischemia.  She does report having pain with PO intake.  The most recent CTA and a prior CT scan of the abdomen is positive for some thickening in the duodenum (01/2019) and thickened gastric lumen.  Further evaluation with an EGD will be performed.  Plan: 1) EGD with Dr. Bryan Lemma tomorrow. 2)  Transfuse.  LOS: 0 days   Efstathios Sawin D 03/25/2019, 5:06 PM

## 2019-03-25 NOTE — H&P (View-Only) (Signed)
Subjective: ABM pain, but it has improved.  Objective: Vital signs in last 24 hours: Temp:  [97.6 F (36.4 C)-98.6 F (37 C)] 98.1 F (36.7 C) (02/26 1603) Pulse Rate:  [49-124] 95 (02/26 1603) Resp:  [8-34] 19 (02/26 1603) BP: (112-170)/(69-109) 149/88 (02/26 1603) SpO2:  [64 %-100 %] 96 % (02/26 1603) Weight:  [49 kg] 49 kg (02/26 0621)    Intake/Output from previous day: No intake/output data recorded. Intake/Output this shift: Total I/O In: 945 [Blood:945] Out: -   General appearance: alert and no distress GI: tender in the mid to left lower abdomen  Lab Results: Recent Labs    03/25/19 0611  WBC 10.8*  HGB 5.1*  HCT 16.8*  PLT 218   BMET Recent Labs    03/25/19 0611  NA 136  K 4.0  CL 98  CO2 16*  GLUCOSE 174*  BUN 80*  CREATININE 5.62*  CALCIUM 8.3*   LFT Recent Labs    03/25/19 0611  PROT 4.7*  ALBUMIN 2.4*  AST 30  ALT 20  ALKPHOS 53  BILITOT 0.5   PT/INR Recent Labs    03/25/19 1313  LABPROT 14.3  INR 1.1   Hepatitis Panel No results for input(s): HEPBSAG, HCVAB, HEPAIGM, HEPBIGM in the last 72 hours. C-Diff No results for input(s): CDIFFTOX in the last 72 hours. Fecal Lactopherrin No results for input(s): FECLLACTOFRN in the last 72 hours.  Studies/Results: DG Chest Port 1 View  Result Date: 03/25/2019 CLINICAL DATA:  Weakness with abdominal pain. EXAM: PORTABLE CHEST 1 VIEW COMPARISON:  03/19/2019 FINDINGS: 7:50 a.m. Cardiopericardial silhouette is at upper limits of normal for size. The lungs are clear without focal pneumonia, edema, pneumothorax or pleural effusion. Interstitial markings are diffusely coarsened with chronic features. Right-sided central line tip is stable, positioned over the upper right atrium. The visualized bony structures of the thorax are intact. Telemetry leads overlie the chest. IMPRESSION: Stable.  No acute findings. Electronically Signed   By: Misty Stanley M.D.   On: 03/25/2019 08:26   CT Angio  Chest/Abd/Pel for Dissection W and/or Wo Contrast  Addendum Date: 03/25/2019   ADDENDUM REPORT: 03/25/2019 09:28 ADDENDUM: These results were called by telephone at the time of interpretation on 03/25/2019 at 9:28 am to provider [facility] RAY , who verbally acknowledged these results. Electronically Signed   By: Zetta Bills M.D.   On: 03/25/2019 09:28   Result Date: 03/25/2019 CLINICAL DATA:  Abdominal pain, acute with nausea and vomiting for 1 week. EXAM: CT ANGIOGRAPHY CHEST, ABDOMEN AND PELVIS TECHNIQUE: Multidetector CT imaging through the chest, abdomen and pelvis was performed using the standard protocol during bolus administration of intravenous contrast. Multiplanar reconstructed images and MIPs were obtained and reviewed to evaluate the vascular anatomy. CONTRAST:  35mL OMNIPAQUE IOHEXOL 350 MG/ML SOLN COMPARISON:  03/19/2019 and multiple prior studies. FINDINGS: CTA CHEST FINDINGS Cardiovascular: No signs of intramural hematoma within the aorta on noncontrast imaging. Low-attenuation cardiac blood pool compatible with anemia. Right sided catheter with small associated thrombus or fibrin sheath (image 39, series 8) findings are similar to the previous exam with low-attenuation crescent around the catheter in the proximal SVC. Abundant atherosclerotic plaque of the thoracic aorta both calcified but predominantly noncalcified without interval change with multiple areas of plaque irregularity but without signs of dissection. Patent great vessels in the chest. Heart size remains enlarged with extensive coronary artery disease, similar to the prior study. No signs of pericardial effusion. Mediastinum/Nodes: Scattered lymph nodes about the mediastinum without  change. No axillary lymphadenopathy. No thoracic inlet lymphadenopathy. Lungs/Pleura: No signs of consolidation or evidence of pleural effusion. Airways are patent. Signs of pulmonary emphysema with centrilobular predominance as noted on the previous  study. Airways are patent. Scattered 2-3 mm pulmonary nodules with similar appearance most notably in the right upper lobe. Musculoskeletal: No signs of acute bone finding or evidence of destructive bone process. No chest wall mass. See below for for full detail. Review of the MIP images confirms the above findings. CTA ABDOMEN AND PELVIS FINDINGS VASCULAR Aorta: Abundant plaque in the abdominal aorta becoming less severe in the distal aorta. Caliber at the aortic hiatus 2.5 x 2.3 cm. Just below the renal arteries 2.5 by 2.4 cm. Distal aorta just above the iliac bifurcation 2.4 x 2.2 cm. No signs of dissection in the abdominal aorta. 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 Collateral pathways arising from the SMA. Vessels in the celiac distribution remain attenuated but patent. SMA: SMA remains attenuated/narrowed with small lumen approximately 1 cm from its origin. Lumen between 2 and 3 mm at this level where as the origin with lumen approximately 5 mm, 7 mm presumed poststenotic dilation just beyond the area of narrowing as before. This shows patency distally but is again supplying the celiac distribution as well. No change since the prior study in this appearance. Renals: Small renal arteries bilaterally with attenuated vessel on the left supplying a scarred left kidney. Right renal artery with abundant plaque at the origin, vessel attenuated distally as before. IMA: IMA is not visualized. Distal aspect of the vessel with some branches that may show minimal opacification suggest collateral pathways. Inflow: Patent without evidence of aneurysm, dissection, vasculitis or significant stenosis. Veins: Limited assessment of venous structures on arterial phase. Review of the MIP images confirms the above findings. NON-VASCULAR Hepatobiliary: Liver is unremarkable. No signs of pericholecystic stranding. Gallstones layer in the gallbladder. No  biliary ductal dilation. Pancreas: Pancreas unremarkable, no signs of peripancreatic inflammation or ductal dilation. Spleen: Spleen is normal. Adrenals/Urinary Tract: Adrenal masses bilaterally, on the left measuring 2.7 x 2.3 cm, on the right measuring 2.6 x 1.4 cm as compared to 2.5 x 1.9 cm. Lesions are similar over time accounting for differences in measurement technique dating back to July of 2020. The right adrenal mass is hyperenhancing on arterial phase more so than the left. Cortical scarring is evident bilaterally with renal vascular calcifications and presumed cysts, also not changed. Urinary bladder is unremarkable. No signs of hydronephrosis. Stomach/Bowel: Under distension versus gastric thickening. Moderate-sized duodenal diverticulum. No signs of bowel obstruction. Post small bowel resection in the left hemiabdomen. Colon is partially fluid-filled. Lymphatic: No signs of abdominal adenopathy. No signs of pelvic lymphadenopathy. Reproductive: Post hysterectomy. Other: No free air. No significant hernia or ascites. Musculoskeletal: No acute bone finding or destructive bone process. Review of the MIP images confirms the above findings. IMPRESSION: CTA CHEST 1. No evidence of acute intramural hematoma or dissection involving the thoracic aorta. 2. Abundant atherosclerotic plaque of the thoracic aorta both calcified but predominantly noncalcified. 3. Cardiomegaly with extensive coronary artery disease. 4. Fibrin sheath or small amount of adherent thrombus along the right IJ catheter in the proximal SVC. 5. Tiny pulmonary nodules are unchanged. CTA ABDOMEN AND PELVIS 1. 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 Also with probable occlusion of the IMA origin. Given this configuration the patient is at  high risk for mesenteric ischemia and or mesenteric angina. No overt signs of ischemia on today's scan. Lactate  correlation may be helpful. 2. No signs of bowel obstruction. 3. Bilateral adrenal masses. The left having a more benign appearance. Hyperenhancement and baseline hyperdensity of the right adrenal mass despite stability could be indicative of pheochromocytoma. Biochemical correlation may be helpful if not yet performed. 4. Cholelithiasis as before. 5. Under distension versus gastric thickening correlate with any symptoms of gastritis. 6. Emphysema and aortic atherosclerosis. 7. Lucency of distal humerus may be related to artifact on the scout view. Correlate with any pain in this area and consider dedicated plain film assessment as warranted. 8. Call is out to the referring provider to further discuss findings in the above case. Aortic Atherosclerosis (ICD10-I70.0) and Emphysema (ICD10-J43.9). Electronically Signed: By: Zetta Bills M.D. On: 03/25/2019 09:19    Medications:  Scheduled: . arformoterol  15 mcg Nebulization BID  . budesonide (PULMICORT) nebulizer solution  0.5 mg Nebulization BID  . [START ON 03/26/2019] Chlorhexidine Gluconate Cloth  6 each Topical Q0600  . [START ON 03/26/2019] doxercalciferol  2 mcg Intravenous Q T,Th,Sa-HD  . ipratropium-albuterol  3 mL Nebulization Q6H  . pantoprazole (PROTONIX) IV  40 mg Intravenous QHS   Continuous: . sodium chloride 10 mL/hr at 03/25/19 1607    Assessment/Plan: 1) Anemia. 2) Melena. 3) Peripheral vascular disease.   The presentation of the patient with melena, mid to left sided abdominal pain, and anemia can be consistent with PUD.  The pretest probability is higher for a case like this one, but she may also have a component of mesenteric ischemia.  She does report having pain with PO intake.  The most recent CTA and a prior CT scan of the abdomen is positive for some thickening in the duodenum (01/2019) and thickened gastric lumen.  Further evaluation with an EGD will be performed.  Plan: 1) EGD with Dr. Bryan Lemma tomorrow. 2)  Transfuse.  LOS: 0 days   Ailanie Ruttan D 03/25/2019, 5:06 PM

## 2019-03-25 NOTE — ED Provider Notes (Addendum)
Physical Exam  BP 138/90   Pulse (!) 124   Temp 98 F (36.7 C) (Oral)   Resp (!) 28   Ht 1.575 m (5\' 2" )   Wt 49 kg   SpO2 100%   BMI 19.75 kg/m   Physical Exam  ED Course/Procedures   Clinical Course as of Mar 24 929  Fri Mar 25, 2019  0929 Discussed ct results with Dr. Jacalyn Lefevre Diffuse vascular disease with chronic occlusion Thrombus of right catheter Adrenal mass with some suspicious hypervascluarization   [DR]    Clinical Course User Index [DR] Pattricia Boss, MD    Procedures  MDM    Patient presents today with increased abdominal pain.  Signed out by Dr. Betsey Holiday pending abdominal cta with known atherosclerotic disease and pain after forcing on exam raising suspicion for ischemic bowel. Patient pending return of this testing and will require admission.  She is anemic with a hemoglobin of 5 and has had rectal bleeding on last admission.  She reports no rectal bleeding during the interim for admission to this event. Patient is not currently on anticoagulation 9:31 AM   Patient with sats 90s on nasal cannula Discussed with critical care and will evaluate General surgery paged Called to bedside due to patient's complaints of dyspnea and low oxygen saturations.  Patient on nasal cannula with oxygen saturations at 70s.  Patient complaining of feeling dyspneic.  She states that this is worsened since she has been here in the ED. Patient exam at this time reveals tachypnea to the 30s. Sats are in the 70s Lungs are clear bilaterally Bedside review of chest x-Myer Bohlman reveals no evidence of pneumothorax or large effusions or infiltrates Nonrebreather placed  1- abdomina pain- ?ischemic bowel.  CT results with chronic occlusions which could represent current ischemia.  Plan consult to general surgery 2- anemia- hgb 5, hemocult pending.  No report of rectal bleeding per patient 3- dyspnea- abg with po2 at 400s on nrb, ? Correlation of pulse ox - will attempt reduction.  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 EXAM: NUCLEAR MEDICINE HEPATOBILIARY IMAGING WITH GALLBLADDER EF TECHNIQUE: Sequential images of the abdomen were obtained out to 60 minutes following intravenous administration of radiopharmaceutical. After oral ingestion of Ensure, gallbladder ejection fraction was determined. At 60 min, normal ejection fraction is greater than 33%. RADIOPHARMACEUTICALS:  4.78 mCi Tc-63m  Choletec IV COMPARISON:  Right upper quadrant ultrasound 03/19/2019. FINDINGS: Prompt uptake and biliary excretion of activity by the liver is seen. Gallbladder activity is visualized, consistent with patency of cystic duct. Biliary activity passes into small bowel, consistent with patent common bile duct. Calculated gallbladder ejection fraction is 67%. (Normal gallbladder ejection fraction with Ensure is greater than 33%.) IMPRESSION: Normal exam. Electronically Signed   By: Inge Rise M.D.   On: 03/21/2019 12:11   DG Chest Port 1 View  Result Date: 03/25/2019 CLINICAL DATA:  Weakness with abdominal pain. EXAM: PORTABLE CHEST 1 VIEW COMPARISON:  03/19/2019 FINDINGS: 7:50 a.m. Cardiopericardial silhouette is at upper limits of normal for size. The lungs are clear without focal pneumonia, edema, pneumothorax or pleural effusion. Interstitial markings are diffusely coarsened with chronic features. Right-sided central line tip is stable, positioned over the upper right atrium. The visualized bony structures of the thorax are intact. Telemetry leads overlie the chest. IMPRESSION: Stable.  No acute findings. Electronically Signed   By: Randall Hiss  Tery Sanfilippo M.D.   On: 03/25/2019 08:26   DCT Angio Chest/Abd/Pel for Dissection W and/or Wo Contrast  Addendum Date: 03/25/2019   ADDENDUM REPORT: 03/25/2019 09:28 ADDENDUM: These results were called by telephone at the time of interpretation on 03/25/2019  at 9:28 am to provider [facility] Christa Fasig , who verbally acknowledged these results. Electronically Signed   By: Zetta Bills M.D.   On: 03/25/2019 09:28   Result Date: 03/25/2019 CLINICAL DATA:  Abdominal pain, acute with nausea and vomiting for 1 week. EXAM: CT ANGIOGRAPHY CHEST, ABDOMEN AND PELVIS TECHNIQUE: Multidetector CT imaging through the chest, abdomen and pelvis was performed using the standard protocol during bolus administration of intravenous contrast. Multiplanar reconstructed images and MIPs were obtained and reviewed to evaluate the vascular anatomy. CONTRAST:  56mL OMNIPAQUE IOHEXOL 350 MG/ML SOLN COMPARISON:  03/19/2019 and multiple prior studies. FINDINGS: CTA CHEST FINDINGS Cardiovascular: No signs of intramural hematoma within the aorta on noncontrast imaging. Low-attenuation cardiac blood pool compatible with anemia. Right sided catheter with small associated thrombus or fibrin sheath (image 39, series 8) findings are similar to the previous exam with low-attenuation crescent around the catheter in the proximal SVC. Abundant atherosclerotic plaque of the thoracic aorta both calcified but predominantly noncalcified without interval change with multiple areas of plaque irregularity but without signs of dissection. Patent great vessels in the chest. Heart size remains enlarged with extensive coronary artery disease, similar to the prior study. No signs of pericardial effusion. Mediastinum/Nodes: Scattered lymph nodes about the mediastinum without change. No axillary lymphadenopathy. No thoracic inlet lymphadenopathy. Lungs/Pleura: No signs of consolidation or evidence of pleural effusion. Airways are patent. Signs of pulmonary emphysema with centrilobular predominance as noted on the previous study. Airways are patent. Scattered 2-3 mm pulmonary nodules with similar appearance most notably in the right upper lobe. Musculoskeletal: No signs of acute bone finding or evidence of destructive bone  process. No chest wall mass. See below for for full detail. Review of the MIP images confirms the above findings. CTA ABDOMEN AND PELVIS FINDINGS VASCULAR Aorta: Abundant plaque in the abdominal aorta becoming less severe in the distal aorta. Caliber at the aortic hiatus 2.5 x 2.3 cm. Just below the renal arteries 2.5 by 2.4 cm. Distal aorta just above the iliac bifurcation 2.4 x 2.2 cm. No signs of dissection in the abdominal aorta. 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 Collateral pathways arising from the SMA. Vessels in the celiac distribution remain attenuated but patent. SMA: SMA remains attenuated/narrowed with small lumen approximately 1 cm from its origin. Lumen between 2 and 3 mm at this level where as the origin with lumen approximately 5 mm, 7 mm presumed poststenotic dilation just beyond the area of narrowing as before. This shows patency distally but is again supplying the celiac distribution as well. No change since the prior study in this appearance. Renals: Small renal arteries bilaterally with attenuated vessel on the left supplying a scarred left kidney. Right renal artery with abundant plaque at the origin, vessel attenuated distally as before. IMA: IMA is not visualized. Distal aspect of the vessel with some branches that may show minimal opacification suggest collateral pathways. Inflow: Patent without evidence of aneurysm, dissection, vasculitis or significant stenosis. Veins: Limited assessment of venous structures on arterial phase. Review of the MIP images confirms the above findings. NON-VASCULAR Hepatobiliary: Liver is unremarkable. No signs of pericholecystic stranding. Gallstones layer in the gallbladder. No biliary ductal dilation. Pancreas: Pancreas unremarkable, no  signs of peripancreatic inflammation or ductal dilation. Spleen: Spleen is normal. Adrenals/Urinary Tract: Adrenal masses bilaterally, on the left  measuring 2.7 x 2.3 cm, on the right measuring 2.6 x 1.4 cm as compared to 2.5 x 1.9 cm. Lesions are similar over time accounting for differences in measurement technique dating back to July of 2020. The right adrenal mass is hyperenhancing on arterial phase more so than the left. Cortical scarring is evident bilaterally with renal vascular calcifications and presumed cysts, also not changed. Urinary bladder is unremarkable. No signs of hydronephrosis. Stomach/Bowel: Under distension versus gastric thickening. Moderate-sized duodenal diverticulum. No signs of bowel obstruction. Post small bowel resection in the left hemiabdomen. Colon is partially fluid-filled. Lymphatic: No signs of abdominal adenopathy. No signs of pelvic lymphadenopathy. Reproductive: Post hysterectomy. Other: No free air. No significant hernia or ascites. Musculoskeletal: No acute bone finding or destructive bone process. Review of the MIP images confirms the above findings. IMPRESSION: CTA CHEST 1. No evidence of acute intramural hematoma or dissection involving the thoracic aorta. 2. Abundant atherosclerotic plaque of the thoracic aorta both calcified but predominantly noncalcified. 3. Cardiomegaly with extensive coronary artery disease. 4. Fibrin sheath or small amount of adherent thrombus along the right IJ catheter in the proximal SVC. 5. Tiny pulmonary nodules are unchanged. CTA ABDOMEN AND PELVIS 1. 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 Also with probable occlusion of the IMA origin. Given this configuration the patient is at high risk for mesenteric ischemia and or mesenteric angina. No overt signs of ischemia on today's scan. Lactate correlation may be helpful. 2. No signs of bowel obstruction. 3. Bilateral adrenal masses. The left having a more benign appearance. Hyperenhancement and baseline hyperdensity of the right adrenal mass despite  stability could be indicative of pheochromocytoma. Biochemical correlation may be helpful if not yet performed. 4. Cholelithiasis as before. 5. Under distension versus gastric thickening correlate with any symptoms of gastritis. 6. Emphysema and aortic atherosclerosis. 7. Lucency of distal humerus may be related to artifact on the scout view. Correlate with any pain in this area and consider dedicated plain film assessment as warranted. 8. Call is out to the referring provider to further discuss findings in the above case. Aortic Atherosclerosis (ICD10-I70.0) and Emphysema (ICD10-J43.9). Electronically Signed: By: Zetta Bills M.D. On: 03/25/2019 09:19   US Abdomen Limited RUQ  Result Date: 03/19/2019 CLINICAL DATA:  Right upper quadrant pain EXAM: ULTRASOUND ABDOMEN LIMITED RIGHT UPPER QUADRANT COMPARISON:  None. FINDINGS: Gallbladder: No gallstones. Positive sonographic Murphy sign noted by sonographer. Diffuse gallbladder wall thickening with small amount of pericholecystic fluid. Common bile duct: Diameter: 3 mm Liver: No focal lesion identified. Within normal limits in parenchymal echogenicity. Portal vein is patent on color Doppler imaging with normal direction of blood flow towards the liver. Other: None. IMPRESSION: Positive sonographic Murphy sign with gallbladder wall thickening and pericholecystic fluid, but no cholelithiasis. Electronically Signed   By: Ulyses Jarred M.D.   On: 03/19/2019 18:53   Discussed with critical care- will see and evaluate Discussed with Eric Form, NP critical care and they will see for admission    Pattricia Boss, MD 03/25/19 [place]    Pattricia Boss, MD 03/25/19 1535

## 2019-03-26 ENCOUNTER — Encounter (HOSPITAL_COMMUNITY)
Admission: EM | Disposition: A | Discharge status: Home Health | Payer: Self-pay | Source: Home / Self Care | Attending: Internal Medicine

## 2019-03-26 ENCOUNTER — Inpatient Hospital Stay (HOSPITAL_COMMUNITY): Payer: Medicare Other | Admitting: Anesthesiology

## 2019-03-26 ENCOUNTER — Encounter (HOSPITAL_COMMUNITY): Payer: Self-pay | Admitting: Pulmonary Disease

## 2019-03-26 ENCOUNTER — Inpatient Hospital Stay (HOSPITAL_COMMUNITY): Payer: Medicare Other

## 2019-03-26 DIAGNOSIS — I10 Essential (primary) hypertension: Secondary | ICD-10-CM

## 2019-03-26 DIAGNOSIS — D62 Acute posthemorrhagic anemia: Secondary | ICD-10-CM

## 2019-03-26 DIAGNOSIS — K269 Duodenal ulcer, unspecified as acute or chronic, without hemorrhage or perforation: Secondary | ICD-10-CM

## 2019-03-26 DIAGNOSIS — D500 Iron deficiency anemia secondary to blood loss (chronic): Secondary | ICD-10-CM

## 2019-03-26 DIAGNOSIS — K2970 Gastritis, unspecified, without bleeding: Secondary | ICD-10-CM

## 2019-03-26 DIAGNOSIS — E785 Hyperlipidemia, unspecified: Secondary | ICD-10-CM

## 2019-03-26 DIAGNOSIS — K264 Chronic or unspecified duodenal ulcer with hemorrhage: Secondary | ICD-10-CM

## 2019-03-26 DIAGNOSIS — K2990 Gastroduodenitis, unspecified, without bleeding: Secondary | ICD-10-CM

## 2019-03-26 DIAGNOSIS — I480 Paroxysmal atrial fibrillation: Secondary | ICD-10-CM

## 2019-03-26 HISTORY — PX: BIOPSY: SHX5522

## 2019-03-26 HISTORY — PX: ESOPHAGOGASTRODUODENOSCOPY (EGD) WITH PROPOFOL: SHX5813

## 2019-03-26 LAB — BPAM RBC
Blood Product Expiration Date: 202103032359
Blood Product Expiration Date: 202103212359
ISSUE DATE / TIME: 202102260842
ISSUE DATE / TIME: 202102261055
Unit Type and Rh: 600
Unit Type and Rh: 6200

## 2019-03-26 LAB — CBC
HCT: 25.3 % — ABNORMAL LOW (ref 36.0–46.0)
Hemoglobin: 8.3 g/dL — ABNORMAL LOW (ref 12.0–15.0)
MCH: 30.3 pg (ref 26.0–34.0)
MCHC: 32.8 g/dL (ref 30.0–36.0)
MCV: 92.3 fL (ref 80.0–100.0)
Platelets: 184 10*3/uL (ref 150–400)
RBC: 2.74 MIL/uL — ABNORMAL LOW (ref 3.87–5.11)
RDW: 16.2 % — ABNORMAL HIGH (ref 11.5–15.5)
WBC: 11.8 10*3/uL — ABNORMAL HIGH (ref 4.0–10.5)
nRBC: 0.3 % — ABNORMAL HIGH (ref 0.0–0.2)

## 2019-03-26 LAB — POCT I-STAT, CHEM 8
BUN: 11 mg/dL (ref 8–23)
Calcium, Ion: 1.08 mmol/L — ABNORMAL LOW (ref 1.15–1.40)
Chloride: 94 mmol/L — ABNORMAL LOW (ref 98–111)
Creatinine, Ser: 1.6 mg/dL — ABNORMAL HIGH (ref 0.44–1.00)
Glucose, Bld: 89 mg/dL (ref 70–99)
HCT: 28 % — ABNORMAL LOW (ref 36.0–46.0)
Hemoglobin: 9.5 g/dL — ABNORMAL LOW (ref 12.0–15.0)
Potassium: 3.5 mmol/L (ref 3.5–5.1)
Sodium: 134 mmol/L — ABNORMAL LOW (ref 135–145)
TCO2: 28 mmol/L (ref 22–32)

## 2019-03-26 LAB — COMPREHENSIVE METABOLIC PANEL
ALT: 25 U/L (ref 0–44)
AST: 33 U/L (ref 15–41)
Albumin: 2.2 g/dL — ABNORMAL LOW (ref 3.5–5.0)
Alkaline Phosphatase: 49 U/L (ref 38–126)
Anion gap: 15 (ref 5–15)
BUN: 75 mg/dL — ABNORMAL HIGH (ref 8–23)
CO2: 19 mmol/L — ABNORMAL LOW (ref 22–32)
Calcium: 8.4 mg/dL — ABNORMAL LOW (ref 8.9–10.3)
Chloride: 106 mmol/L (ref 98–111)
Creatinine, Ser: 5.93 mg/dL — ABNORMAL HIGH (ref 0.44–1.00)
GFR calc Af Amer: 8 mL/min — ABNORMAL LOW (ref >60)
GFR calc non Af Amer: 7 mL/min — ABNORMAL LOW (ref >60)
Glucose, Bld: 102 mg/dL — ABNORMAL HIGH (ref 70–99)
Potassium: 4.5 mmol/L (ref 3.5–5.1)
Sodium: 140 mmol/L (ref 135–145)
Total Bilirubin: 0.8 mg/dL (ref 0.3–1.2)
Total Protein: 4.4 g/dL — ABNORMAL LOW (ref 6.5–8.1)

## 2019-03-26 LAB — TYPE AND SCREEN
ABO/RH(D): A POS
Antibody Screen: NEGATIVE
Unit division: 0
Unit division: 0

## 2019-03-26 LAB — MAGNESIUM: Magnesium: 1.9 mg/dL (ref 1.7–2.4)

## 2019-03-26 LAB — PHOSPHORUS: Phosphorus: 6.5 mg/dL — ABNORMAL HIGH (ref 2.5–4.6)

## 2019-03-26 LAB — LIPASE, BLOOD: Lipase: 64 U/L — ABNORMAL HIGH (ref 11–51)

## 2019-03-26 SURGERY — ESOPHAGOGASTRODUODENOSCOPY (EGD) WITH PROPOFOL
Anesthesia: Monitor Anesthesia Care

## 2019-03-26 MED ORDER — LIDOCAINE-PRILOCAINE 2.5-2.5 % EX CREA: 1.0000 "application " | TOPICAL_CREAM | CUTANEOUS | Status: DC | PRN

## 2019-03-26 MED ORDER — FENTANYL CITRATE (PF) 100 MCG/2ML IJ SOLN
INTRAMUSCULAR | Status: AC
  Administered 2019-03-26: 50 ug via INTRAVENOUS
  Filled 2019-03-26: qty 2

## 2019-03-26 MED ORDER — SUCRALFATE 1 GM/10ML PO SUSP
1.0000 g | Freq: Four times a day (QID) | ORAL | Status: DC
  Administered 2019-03-26 – 2019-03-30 (×15): 1 g via ORAL
  Filled 2019-03-26 (×15): qty 10

## 2019-03-26 MED ORDER — PRAVASTATIN SODIUM 40 MG PO TABS
80.0000 mg | ORAL_TABLET | Freq: Every evening | ORAL | Status: DC
  Administered 2019-03-26 – 2019-03-29 (×4): 80 mg via ORAL
  Filled 2019-03-26 (×4): qty 2

## 2019-03-26 MED ORDER — METOPROLOL TARTRATE 50 MG PO TABS
100.0000 mg | ORAL_TABLET | Freq: Two times a day (BID) | ORAL | Status: DC
  Administered 2019-03-26 – 2019-03-30 (×8): 100 mg via ORAL
  Filled 2019-03-26 (×2): qty 2
  Filled 2019-03-26: qty 1
  Filled 2019-03-26 (×6): qty 2

## 2019-03-26 MED ORDER — FERRIC CITRATE 1 GM 210 MG(FE) PO TABS: 420.0000 mg | ORAL_TABLET | ORAL | Status: DC | PRN

## 2019-03-26 MED ORDER — HEPARIN SODIUM (PORCINE) 1000 UNIT/ML IJ SOLN
INTRAMUSCULAR | Status: AC
  Filled 2019-03-26: qty 4

## 2019-03-26 MED ORDER — FLUTICASONE FUROATE-VILANTEROL 200-25 MCG/INH IN AEPB
1.0000 | INHALATION_SPRAY | Freq: Every day | RESPIRATORY_TRACT | Status: DC
  Administered 2019-03-27 – 2019-03-30 (×3): 1 via RESPIRATORY_TRACT
  Filled 2019-03-26: qty 28

## 2019-03-26 MED ORDER — SUCRALFATE 1 GM/10ML PO SUSP: 1.0000 g | Freq: Three times a day (TID) | ORAL | Status: DC

## 2019-03-26 MED ORDER — TRAMADOL HCL 50 MG PO TABS
50.0000 mg | ORAL_TABLET | Freq: Once | ORAL | Status: DC
  Filled 2019-03-26 (×2): qty 1

## 2019-03-26 MED ORDER — NITROGLYCERIN 0.4 MG SL SUBL: 0.4000 mg | SUBLINGUAL_TABLET | SUBLINGUAL | Status: DC | PRN

## 2019-03-26 MED ORDER — METOPROLOL TARTRATE 5 MG/5ML IV SOLN
INTRAVENOUS | Status: DC | PRN
  Administered 2019-03-26 (×5): 1 mg via INTRAVENOUS

## 2019-03-26 MED ORDER — METOPROLOL TARTRATE 5 MG/5ML IV SOLN
2.5000 mg | INTRAVENOUS | Status: AC | PRN
  Administered 2019-03-26: 2.5 mg via INTRAVENOUS

## 2019-03-26 MED ORDER — DICYCLOMINE HCL 10 MG PO CAPS
20.0000 mg | ORAL_CAPSULE | Freq: Every day | ORAL | Status: DC
  Administered 2019-03-26 – 2019-03-29 (×4): 20 mg via ORAL
  Filled 2019-03-26 (×4): qty 2

## 2019-03-26 MED ORDER — HEPARIN SODIUM (PORCINE) 1000 UNIT/ML DIALYSIS: 1000.0000 [IU] | INTRAMUSCULAR | Status: DC | PRN

## 2019-03-26 MED ORDER — FENTANYL CITRATE (PF) 100 MCG/2ML IJ SOLN: 50.0000 ug | Freq: Once | INTRAMUSCULAR | Status: AC

## 2019-03-26 MED ORDER — ALTEPLASE 2 MG IJ SOLR: 2.0000 mg | Freq: Once | INTRAMUSCULAR | Status: DC | PRN

## 2019-03-26 MED ORDER — DOXERCALCIFEROL 4 MCG/2ML IV SOLN
INTRAVENOUS | Status: AC
  Administered 2019-03-26: 2 ug via INTRAVENOUS
  Filled 2019-03-26: qty 2

## 2019-03-26 MED ORDER — PANTOPRAZOLE SODIUM 40 MG IV SOLR
40.0000 mg | Freq: Two times a day (BID) | INTRAVENOUS | Status: DC
  Administered 2019-03-26 – 2019-03-30 (×8): 40 mg via INTRAVENOUS
  Filled 2019-03-26 (×8): qty 40

## 2019-03-26 MED ORDER — VITAMIN D 25 MCG (1000 UNIT) PO TABS
2000.0000 [IU] | ORAL_TABLET | Freq: Every day | ORAL | Status: DC
  Administered 2019-03-26 – 2019-03-30 (×5): 2000 [IU] via ORAL
  Filled 2019-03-26 (×6): qty 2

## 2019-03-26 MED ORDER — ALLOPURINOL 100 MG PO TABS
100.0000 mg | ORAL_TABLET | Freq: Every day | ORAL | Status: DC
  Administered 2019-03-26 – 2019-03-30 (×5): 100 mg via ORAL
  Filled 2019-03-26 (×5): qty 1

## 2019-03-26 MED ORDER — ESMOLOL HCL 100 MG/10ML IV SOLN
INTRAVENOUS | Status: DC | PRN
  Administered 2019-03-26 (×3): 20 mg via INTRAVENOUS

## 2019-03-26 MED ORDER — PENTAFLUOROPROP-TETRAFLUOROETH EX AERO: 1.0000 "application " | INHALATION_SPRAY | CUTANEOUS | Status: DC | PRN

## 2019-03-26 MED ORDER — METOPROLOL TARTRATE 5 MG/5ML IV SOLN
INTRAVENOUS | Status: AC
  Administered 2019-03-26: 2.5 mg via INTRAVENOUS
  Filled 2019-03-26: qty 5

## 2019-03-26 MED ORDER — POLYVINYL ALCOHOL 1.4 % OP SOLN
1.0000 [drp] | Freq: Every day | OPHTHALMIC | Status: DC | PRN
  Filled 2019-03-26: qty 15

## 2019-03-26 MED ORDER — FERRIC CITRATE 1 GM 210 MG(FE) PO TABS
630.0000 mg | ORAL_TABLET | Freq: Three times a day (TID) | ORAL | Status: DC
  Administered 2019-03-26 – 2019-03-30 (×11): 630 mg via ORAL
  Filled 2019-03-26 (×12): qty 3

## 2019-03-26 MED ORDER — TRAMADOL HCL 50 MG PO TABS
ORAL_TABLET | ORAL | Status: AC
  Administered 2019-03-26: 50 mg via ORAL
  Filled 2019-03-26: qty 1

## 2019-03-26 MED ORDER — GABAPENTIN 300 MG PO CAPS
300.0000 mg | ORAL_CAPSULE | Freq: Every day | ORAL | Status: DC
  Administered 2019-03-26 – 2019-03-29 (×4): 300 mg via ORAL
  Filled 2019-03-26 (×4): qty 1

## 2019-03-26 MED ORDER — TRAMADOL HCL 50 MG PO TABS
50.0000 mg | ORAL_TABLET | Freq: Four times a day (QID) | ORAL | Status: DC | PRN
  Administered 2019-03-26 – 2019-03-29 (×5): 50 mg via ORAL
  Filled 2019-03-26 (×4): qty 1

## 2019-03-26 MED ORDER — SODIUM CHLORIDE 0.9 % IV SOLN: 100.0000 mL | INTRAVENOUS | Status: DC | PRN

## 2019-03-26 MED ORDER — METOPROLOL TARTRATE 5 MG/5ML IV SOLN
INTRAVENOUS | Status: AC
  Filled 2019-03-26: qty 5

## 2019-03-26 MED ORDER — PROPOFOL 10 MG/ML IV BOLUS
INTRAVENOUS | Status: DC | PRN
  Administered 2019-03-26 (×2): 20 mg via INTRAVENOUS

## 2019-03-26 MED ORDER — PROPOFOL 500 MG/50ML IV EMUL
INTRAVENOUS | Status: DC | PRN
  Administered 2019-03-26: 100 ug/kg/min via INTRAVENOUS

## 2019-03-26 MED ORDER — LIDOCAINE HCL (PF) 1 % IJ SOLN: 5.0000 mL | INTRAMUSCULAR | Status: DC | PRN

## 2019-03-26 SURGICAL SUPPLY — 15 items

## 2019-03-26 NOTE — Progress Notes (Signed)
Patient has history of A-fib but arrived on the Hd unit NSR; 1 hrs into the tx pt converted to A-fib HR low 100 - 120; aas HD tx progressed HR >140's Dr. Roney Jaffe notified he ordered Metoptolol 2.5mg  IV x2  10 mins if HR >130's after the initial dose. HR continued to increase; at one point noted in the 160's but wd drop to 130's.administered the second dose of Metoprolol 2.5mg  IV and tramadol 50 mg. Pt states she feels like electricity is going through and is becoming restless but calms with redirection. Dr. Roney Jaffe ordered EKG and Fentanyl 19mcg IV; pt rested for approximately 1.5 hrs and she woke up. Her HR>145 and restless again; Dr. Algernon Huxley notified and he ordered HD tx to be stopped after 3.25hrs of 4hrs. ENDO call to check on pt; made aware of pt's HRs and decided  to send the pt to her room while she talks to the MD. ENDO called back and asked HD to send the pt to ENDO instead of her room.

## 2019-03-26 NOTE — Op Note (Signed)
[facility] Patient Name: Christina [name] Procedure Date : 03/26/2019 MRN: [id number] Attending MD: Gerrit Heck , MD Date of Birth: [birth year] CSN: [id number] Age: 69 Admit Type: Inpatient Procedure:                Upper GI endoscopy Indications:              Epigastric abdominal pain, Acute post hemorrhagic                            anemia, Melena, Abnormal CT of the GI tract,                            Abnormal CT Angiogram Providers:                Gerrit Heck, MD, Kary Kos, RN, Laverda Sorenson, Technician, Clearnce Sorrel, CRNA Referring MD:              Medicines:                Monitored Anesthesia Care Complications:            No immediate complications. Estimated Blood Loss:     Estimated blood loss was minimal. Procedure:                Pre-Anesthesia Assessment:                           - Prior to the procedure, a History and Physical                            was performed, and patient medications and                            allergies were reviewed. The patient's tolerance of                            previous anesthesia was also reviewed. The risks                            and benefits of the procedure and the sedation                            options and risks were discussed with the patient.                            All questions were answered, and informed consent                            was obtained. Prior Anticoagulants: The patient has                            taken no previous anticoagulant or antiplatelet  agents. ASA Grade Assessment: III - A patient with                            severe systemic disease. After reviewing the risks                            and benefits, the patient was deemed in                            satisfactory condition to undergo the procedure.                           After obtaining informed consent, the endoscope was     passed under direct vision. Throughout the                            procedure, the patient's blood pressure, pulse, and                            oxygen saturations were monitored continuously. The                            GIF-H190 ([id number]) Olympus gastroscope was                            introduced through the mouth, and advanced to the                            second part of duodenum. The upper GI endoscopy was                            accomplished without difficulty. The patient                            tolerated the procedure well. Scope In: Scope Out: Findings:      LA Grade B (one or more mucosal breaks greater than 5 mm, not extending       between the tops of two mucosal folds) esophagitis with no bleeding was       found in the lower third of the esophagus.      A 2 cm hiatal hernia was present.      The gastroesophageal flap valve was visualized endoscopically and       classified as Hill Grade IV (no fold, wide open lumen, hiatal hernia       present).      Scattered mild inflammation characterized by erythema was found in the       gastric body and in the gastric antrum. Biopsies were taken with a cold       forceps for Helicobacter pylori testing. Estimated blood loss was       minimal.      Multiple cratered duodenal ulcers and duodenitis was found in the       duodenal bulb. The largest lesion was 15 mm and deeply cratered with       pigmented material at the base of the ulcer. This ulcer is too large  for       endoscopic intervention. Biopsies were taken with a cold forceps for       histology. Estimated blood loss was minimal.      The second portion of the duodenum was normal. Impression:               - LA Grade B esophagitis with no bleeding.                           - 2 cm hiatal hernia.                           - Gastroesophageal flap valve classified as Hill                            Grade IV (no fold, wide open lumen, hiatal hernia                             present).                           - Gastritis. Biopsied.                           - Multiple duodenal ulcers limited to the duodenal                            bulb. The largest was 15 mm and deeply cratered                            with pigmented material. This ulcer would be too                            large for successful endoscopic intervention. If                            concern for bleeding , recommend IR or surgical                            consultation. The CTA findings along with these                            endoscopic findings are concerning for ischemic                            etiology. The area was biopsied.                           - Normal second portion of the duodenum. Recommendation:           - Return patient to hospital ward for ongoing care.                           - Clear liquid diet only today.                           -  Use Protonix (pantoprazole) 40 mg IV BID.                           - Use sucralfate suspension 1 gram PO QID.                           - Continue serial H/H checks with blood products                            per protocol. Procedure Code(s):        --- Professional ---                           [id number], Esophagogastroduodenoscopy, flexible,                            transoral; with biopsy, single or multiple Diagnosis Code(s):        --- Professional ---                           K20.90, Esophagitis, unspecified without bleeding                           K44.9, Diaphragmatic hernia without obstruction or                            gangrene                           K29.70, Gastritis, unspecified, without bleeding                           K26.9, Duodenal ulcer, unspecified as acute or                            chronic, without hemorrhage or perforation                           R10.13, Epigastric pain                           D62, Acute posthemorrhagic anemia                           K92.1, Melena  (includes Hematochezia)                           R93.3, Abnormal findings on diagnostic imaging of                            other parts of digestive tract CPT copyright 2019 [employer]. All rights reserved. The codes documented in this report are preliminary and upon coder review may  be revised to meet current compliance requirements. Gerrit Heck, MD 03/26/2019 1:23:57 PM Number of Addenda: 0

## 2019-03-26 NOTE — Progress Notes (Signed)
[id number] Seifert-69 year old female with end-stage renal on HD represented today with recurrent upper abdominal pain, nausea vomiting and acute on chronic anemia. Hemoglobin around 5. She was on the service as a consult earlier in the week for acalculus cholecystitis. HIDA was negative. She has chronic SMA and IMA occlusion. Called today for possible mesenteric ischemia. No sign of bowel compromise on CTA today. 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 I think she may have either peptic ulcer disease or bleeding duodenal diverticulum. Recommended they call GI back. Also told them that if she bled again to go to IR. She has had several prior laparotomies    Subjective/Chief Complaint: Patient is in dialysis currently EGD by GI later today to evaluate thickening of duodenum Hgb 8.3   Objective: Vital signs in last 24 hours: Temp:  [98 F (36.7 C)-98.6 F (37 C)] 98 F (36.7 C) (02/27 0700) Pulse Rate:  [81-124] 112 (02/27 0830) Resp:  [8-31] 21 (02/27 0830) BP: (112-180)/(57-97) 139/57 (02/27 0830) SpO2:  [96 %-100 %] 100 % (02/27 0830) Weight:  [46.2 kg] 46.2 kg (02/27 0500) Last BM Date: 03/25/19  Intake/Output from previous day: 02/26 0701 - 02/27 0700 In: 1069.9 [I.V.:124.9; Blood:945] Out: 300 [Urine:300] Intake/Output this shift: No intake/output data recorded.  Abd - mildly tender in left abdomen  Lab Results:  Recent Labs    03/25/19 2007 03/26/19 0236  WBC 14.7* 11.8*  HGB 9.0* 8.3*  HCT 26.9* 25.3*  PLT 189 184   BMET Recent Labs    03/25/19 0611 03/26/19 0236  NA 136 140  K 4.0 4.5  CL 98 106  CO2 16* 19*  GLUCOSE 174* 102*  BUN 80* 75*  CREATININE 5.62* 5.93*  CALCIUM 8.3* 8.4*   PT/INR Recent Labs    03/25/19 1313  LABPROT 14.3  INR 1.1   ABG No results for input(s): PHART, HCO3 in the last 72 hours.  Invalid input(s): PCO2, PO2  Studies/Results: DG  CHEST PORT 1 VIEW  Result Date: 03/26/2019 CLINICAL DATA:  Acute respiratory failure. EXAM: PORTABLE CHEST 1 VIEW COMPARISON:  03/25/2019 FINDINGS: 0609 hours. Lungs are hyperexpanded. The cardio pericardial silhouette is enlarged. Interstitial markings are diffusely coarsened with chronic features. The lungs are clear without focal pneumonia, edema, pneumothorax or pleural effusion. Prominent skin fold over the left hemithorax. Right IJ central line tip overlies the SVC/RA junction possibly just into the upper right atrium. The visualized bony structures of the thorax are intact. Telemetry leads overlie the chest. IMPRESSION: Hyperexpansion with chronic interstitial coarsening. No acute cardiopulmonary findings. Electronically Signed   By: Misty Stanley M.D.   On: 03/26/2019 08:31   DG Chest Port 1 View  Result Date: 03/25/2019 CLINICAL DATA:  Weakness with abdominal pain. EXAM: PORTABLE CHEST 1 VIEW COMPARISON:  03/19/2019 FINDINGS: 7:50 a.m. Cardiopericardial silhouette is at upper limits of normal for size. The lungs are clear without focal pneumonia, edema, pneumothorax or pleural effusion. Interstitial markings are diffusely coarsened with chronic features. Right-sided central line tip is stable, positioned over the upper right atrium. The visualized bony structures of the thorax are intact. Telemetry leads overlie the chest. IMPRESSION: Stable.  No acute findings. Electronically Signed   By: Misty Stanley M.D.   On: 03/25/2019 08:26   CT Angio Chest/Abd/Pel for Dissection W and/or Wo Contrast  Addendum Date: 03/25/2019   ADDENDUM REPORT: 03/25/2019 09:28 ADDENDUM: These results were called by telephone at  the time of interpretation on 03/25/2019 at 9:28 am to provider [facility] RAY , who verbally acknowledged these results. Electronically Signed   By: Zetta Bills M.D.   On: 03/25/2019 09:28   Result Date: 03/25/2019 CLINICAL DATA:  Abdominal pain, acute with nausea and vomiting for 1 week. EXAM:  CT ANGIOGRAPHY CHEST, ABDOMEN AND PELVIS TECHNIQUE: Multidetector CT imaging through the chest, abdomen and pelvis was performed using the standard protocol during bolus administration of intravenous contrast. Multiplanar reconstructed images and MIPs were obtained and reviewed to evaluate the vascular anatomy. CONTRAST:  61mL OMNIPAQUE IOHEXOL 350 MG/ML SOLN COMPARISON:  03/19/2019 and multiple prior studies. FINDINGS: CTA CHEST FINDINGS Cardiovascular: No signs of intramural hematoma within the aorta on noncontrast imaging. Low-attenuation cardiac blood pool compatible with anemia. Right sided catheter with small associated thrombus or fibrin sheath (image 39, series 8) findings are similar to the previous exam with low-attenuation crescent around the catheter in the proximal SVC. Abundant atherosclerotic plaque of the thoracic aorta both calcified but predominantly noncalcified without interval change with multiple areas of plaque irregularity but without signs of dissection. Patent great vessels in the chest. Heart size remains enlarged with extensive coronary artery disease, similar to the prior study. No signs of pericardial effusion. Mediastinum/Nodes: Scattered lymph nodes about the mediastinum without change. No axillary lymphadenopathy. No thoracic inlet lymphadenopathy. Lungs/Pleura: No signs of consolidation or evidence of pleural effusion. Airways are patent. Signs of pulmonary emphysema with centrilobular predominance as noted on the previous study. Airways are patent. Scattered 2-3 mm pulmonary nodules with similar appearance most notably in the right upper lobe. Musculoskeletal: No signs of acute bone finding or evidence of destructive bone process. No chest wall mass. See below for for full detail. Review of the MIP images confirms the above findings. CTA ABDOMEN AND PELVIS FINDINGS VASCULAR Aorta: Abundant plaque in the abdominal aorta becoming less severe in the distal aorta. Caliber at the aortic  hiatus 2.5 x 2.3 cm. Just below the renal arteries 2.5 by 2.4 cm. Distal aorta just above the iliac bifurcation 2.4 x 2.2 cm. No signs of dissection in the abdominal aorta. 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 Collateral pathways arising from the SMA. Vessels in the celiac distribution remain attenuated but patent. SMA: SMA remains attenuated/narrowed with small lumen approximately 1 cm from its origin. Lumen between 2 and 3 mm at this level where as the origin with lumen approximately 5 mm, 7 mm presumed poststenotic dilation just beyond the area of narrowing as before. This shows patency distally but is again supplying the celiac distribution as well. No change since the prior study in this appearance. Renals: Small renal arteries bilaterally with attenuated vessel on the left supplying a scarred left kidney. Right renal artery with abundant plaque at the origin, vessel attenuated distally as before. IMA: IMA is not visualized. Distal aspect of the vessel with some branches that may show minimal opacification suggest collateral pathways. Inflow: Patent without evidence of aneurysm, dissection, vasculitis or significant stenosis. Veins: Limited assessment of venous structures on arterial phase. Review of the MIP images confirms the above findings. NON-VASCULAR Hepatobiliary: Liver is unremarkable. No signs of pericholecystic stranding. Gallstones layer in the gallbladder. No biliary ductal dilation. Pancreas: Pancreas unremarkable, no signs of peripancreatic inflammation or ductal dilation. Spleen: Spleen is normal. Adrenals/Urinary Tract: Adrenal masses bilaterally, on the left measuring 2.7 x 2.3 cm, on the right measuring 2.6 x 1.4 cm as compared to 2.5  x 1.9 cm. Lesions are similar over time accounting for differences in measurement technique dating back to July of 2020. The right adrenal mass is hyperenhancing on arterial phase more  so than the left. Cortical scarring is evident bilaterally with renal vascular calcifications and presumed cysts, also not changed. Urinary bladder is unremarkable. No signs of hydronephrosis. Stomach/Bowel: Under distension versus gastric thickening. Moderate-sized duodenal diverticulum. No signs of bowel obstruction. Post small bowel resection in the left hemiabdomen. Colon is partially fluid-filled. Lymphatic: No signs of abdominal adenopathy. No signs of pelvic lymphadenopathy. Reproductive: Post hysterectomy. Other: No free air. No significant hernia or ascites. Musculoskeletal: No acute bone finding or destructive bone process. Review of the MIP images confirms the above findings. IMPRESSION: CTA CHEST 1. No evidence of acute intramural hematoma or dissection involving the thoracic aorta. 2. Abundant atherosclerotic plaque of the thoracic aorta both calcified but predominantly noncalcified. 3. Cardiomegaly with extensive coronary artery disease. 4. Fibrin sheath or small amount of adherent thrombus along the right IJ catheter in the proximal SVC. 5. Tiny pulmonary nodules are unchanged. CTA ABDOMEN AND PELVIS 1. 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 Also with probable occlusion of the IMA origin. Given this configuration the patient is at high risk for mesenteric ischemia and or mesenteric angina. No overt signs of ischemia on today's scan. Lactate correlation may be helpful. 2. No signs of bowel obstruction. 3. Bilateral adrenal masses. The left having a more benign appearance. Hyperenhancement and baseline hyperdensity of the right adrenal mass despite stability could be indicative of pheochromocytoma. Biochemical correlation may be helpful if not yet performed. 4. Cholelithiasis as before. 5. Under distension versus gastric thickening correlate with any symptoms of gastritis. 6. Emphysema and aortic atherosclerosis.  7. Lucency of distal humerus may be related to artifact on the scout view. Correlate with any pain in this area and consider dedicated plain film assessment as warranted. 8. Call is out to the referring provider to further discuss findings in the above case. Aortic Atherosclerosis (ICD10-I70.0) and Emphysema (ICD10-J43.9). Electronically Signed: By: Zetta Bills M.D. On: 03/25/2019 09:19    Anti-infectives: Anti-infectives (From admission, onward)   None      Assessment/Plan: Hypertension Hyperlipidemia CAD (stent x2) COPD Paroxysmal A. 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  She seen previously for concerns of a calculus cholecystitis with a negative HIDA scan.  There was some concerns for peptic ulcer disease given the duodenal thickening seen on prior CT scans.  - CTA 2/26 shows chronic occlusion of the celiac with SMA collateral pathways similar to prior study, also with probable occlusion of the IMA.  Results report patient is a high risk for mesenteric ischemia.  There is no overt signs of ischemia on today's scan.  No pneumoperitoneum or free air.  Lactic acid is now normal after hydration.  Her pain is not out of proportion on exam and she is without peritonitis.  No role for acute surgical intervention at this time. Primary team could discuss with vascular to see if there is any options for chronic above findings. - Gastric thickening noted on scan.  Her duodenum was noted be thickened with adjacent fat stranding on 02/27/2019 CT. GI to perform upper endoscopy today  LOS: 1 day    Christina [name] 03/26/2019

## 2019-03-26 NOTE — Interval H&P Note (Signed)
History and Physical Interval Note:  03/26/2019 12:27 PM  Christina [name]  has presented today for surgery, with the diagnosis of GI bleed.  The various methods of treatment have been discussed with the patient and family. After consideration of risks, benefits and other options for treatment, the patient has consented to  Procedure(s): ESOPHAGOGASTRODUODENOSCOPY (EGD) WITH PROPOFOL (N/A) as a surgical intervention.  The patient's history has been reviewed, patient examined, no change in status, stable for surgery.  I have reviewed the patient's chart and labs.  Questions were answered to the patient's satisfaction.     Dominic Pea Baillie Mohammad

## 2019-03-26 NOTE — Anesthesia Preprocedure Evaluation (Signed)
Anesthesia Evaluation  Patient identified by MRN, date of birth, ID band Patient awake    Reviewed: Allergy & Precautions, NPO status , Patient's Chart, lab work & pertinent test results  Airway Mallampati: I  TM Distance: >3 FB Neck ROM: Full    Dental  (+) Upper Dentures, Dental Advisory Given, Missing,    Pulmonary asthma , COPD,  COPD inhaler, former smoker,    Pulmonary exam normal        Cardiovascular hypertension, Pt. on home beta blockers + CAD   Rhythm:Irregular Rate:Tachycardia     Neuro/Psych  Headaches, PSYCHIATRIC DISORDERS Anxiety Depression Bipolar Disorder CVA    GI/Hepatic Neg liver ROS, GERD  Medicated,  Endo/Other    Renal/GU Renal disease     Musculoskeletal   Abdominal Normal abdominal exam  (+)   Peds  Hematology   Anesthesia Other Findings   Reproductive/Obstetrics                             Anesthesia Physical Anesthesia Plan  ASA: III  Anesthesia Plan: MAC   Post-op Pain Management:    Induction: Intravenous  PONV Risk Score and Plan: 0  Airway Management Planned: Simple Face Mask and Nasal Cannula  Additional Equipment:   Intra-op Plan:   Post-operative Plan:   Informed Consent: I have reviewed the patients History and Physical, chart, labs and discussed the procedure including the risks, benefits and alternatives for the proposed anesthesia with the patient or authorized representative who has indicated his/her understanding and acceptance.       Plan Discussed with: CRNA  Anesthesia Plan Comments: (Metoprolol for HR)        Anesthesia Quick Evaluation

## 2019-03-26 NOTE — Progress Notes (Signed)
[facility] Progress Note   Subjective: abd pain "a little better". For EGD today  Objective Vitals:   03/26/19 0708 03/26/19 0730 03/26/19 0800 03/26/19 0830  BP: (!) 155/76 (!) 152/86 135/72 (!) 139/57  Pulse: 99 88 (!) 119 (!) 112  Resp: (!) 21 (!) 22 19 (!) 21  Temp:      TempSrc:      SpO2: 100% 100% 100% 100%  Weight:      Height:       Physical Exam General:Chronically ill appearing female in NAD Heart: S1,S2 irreg, irreg 1/6 systolic M. AFib on monitor rate 95-100s Lungs: CTAB A/P Abdomen: S, NT, no ascites, active BS Extremities: No LE edema Dialysis Access: RIJ TDC Drsg CDI  HD orders: [place] T,Th,S 4 hrs 180NRe 250/Autoflow 1.5 44 kg 2.0 K/2.0 Ca UFP 2 -No heparin -Venofer 50 mg IV weekly (last dose 100 mg IV 03/22/19) -Calcitriol 1.0 mcg PO TIW  Assessment/Plan: 1. GIB: HGB 5.1 on adm > SP 2 units PRBCs and Hb 8.3 this am. GI consulted >  for EGD today, PUD vs mes ischemia as cause of abd pain.  2. Abdominal/Chest pain. Gen. Surgery consulted, noted chronic IMA/ SMA occlusion per CTA abd. Will keep SBP >100 on HD. Tested positive for cocaine-may be a contributing factor.   3. ESRD - TTS HD. Missed and truncated treatments. Continue to have urine output. HD today. Will dc foley. Pt having SVT on HD, prob afib, will get EKG and give MTP IV 2.5 x 2 prn. No CP or SOB.  Will d/w primary.  4. Anemia -As noted above. Mircera 30 mcg IV 03/15/2019. No ESA ordered in [place]. Follow HGB, may need to redose ESA. 5. Secondary hyperparathyroidism -Binders on hold while NPO. Use IV Hectorol until able to take PO meds.  6. HTN/volume - 2 kg up and IS edema on CXR. Pull 2L today as tol.  7. Nutrition-NPO at present. Very low albumin. Renal diet with prostat, renal vits when able to eat.  8. COPD-per primary  Kelly Splinter, MD 03/26/2019, 9:01 AM      Additional Objective Labs: Basic Metabolic Panel: Recent Labs  Lab 03/21/19 1029 03/21/19 1029  03/22/19 0802 03/25/19 0611 03/25/19 1313 03/26/19 0236  NA 138   < > 139 136  --  140  K 3.2*   < > 3.3* 4.0  --  4.5  CL 98   < > 100 98  --  106  CO2 23   < > 22 16*  --  19*  GLUCOSE 189*   < > 230* 174*  --  102*  BUN 29*   < > 40* 80*  --  75*  CREATININE 3.80*   < > 5.13* 5.62*  --  5.93*  CALCIUM 7.3*   < > 7.4* 8.3*  --  8.4*  PHOS 6.2*  --   --   --  6.2* 6.5*   < > = values in this interval not displayed.   Medications: . sodium chloride 10 mL/hr at 03/25/19 1607  . sodium chloride 20 mL/hr at 03/26/19 0305  . sodium chloride    . sodium chloride     . arformoterol  15 mcg Nebulization BID  . budesonide (PULMICORT) nebulizer solution  0.5 mg Nebulization BID  . Chlorhexidine Gluconate Cloth  6 each Topical Q0600  . doxercalciferol  2 mcg Intravenous Q T,Th,Sa-HD  . ipratropium-albuterol  3 mL Nebulization BID  . metoprolol tartrate      .  pantoprazole (PROTONIX) IV  40 mg Intravenous QHS

## 2019-03-26 NOTE — Progress Notes (Signed)
PROGRESS NOTE    Christina [name]  DXI:[id number] DOB: 1951/01/09 DOA: 03/25/2019 PCP: Maggie Schwalbe, PA-C    Brief Narrative:  Patient was admitted to the hospital with the working diagnosis of ischemic colitis, complicated with severe acute blood loss anemia due to lower gi bleed.   69 year old female with a past medical history for end-stage renal disease on hemodialysis (Tuesday, Thursday, Saturday), hypertension, atrial fibrillation, coronary disease, COPD and history of small bowel obstructions.  Patient presented with abdominal pain and chest pain, associated with diarrhea and weakness.  Apparently she missed her last hemodialysis session on 02/25.  Her initial physical examination her blood pressure was 135/73, heart rate 124, temperature 98, respiratory 22, oxygen saturation 100%.  She had coarse breath sounds bilaterally, diminished at bases, no wheezing, heart S1-S2 present, irregularly irregular, abdomen tender to palpation, distended, no rebound, positive bowel sounds, no lower extremity edema. Sodium 136, potassium 4.0, chloride 98, bicarb 16, glucose 174, BUN 80, creatinine 5.6, troponin I 55, white count 10.8, hemoglobin 5.1, hematocrit 16.8, platelets 218.  SARS COVID-19 negative.  Urine analysis negative for infection.  Drug screen positive for cocaine. 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  Chest radiograph with hyperinflation, hilar vascular congestion.  EKG 125 bpm, normal axis, normal QRS, atrial fibrillation rhythm, no ST segment changes, inferior lateral T wave inversions.  Received 1 unit packed red blood cells, with good toleration.  Today patient underwent hemodialysis and upper endoscopy.  Surgery recommended conservative medical therapy.  Assessment & Plan:   Principal Problem:   Ischemic  bowel disease ([place]) Active Problems:   ESRD (end stage renal disease) ([place])   Bipolar affective ([place])   CAD (coronary artery disease), native coronary artery   COPD with chronic bronchitis (HCC)   CVA (cerebral vascular accident) ([place])   Essential hypertension   Hyperlipidemia   Anemia due to chronic blood loss   Afib (HCC)   AF (paroxysmal atrial fibrillation) ([place])   1. Acute blood loss anemia due to upper and lower GI bleed, ischemic colitis and duodenal ulcers. Hgb today after one unit PRBC transfusion is 8,3 with Hct at 25,3. Upper endoscopy with positive gastritis and duodenal ulcer (to large for endoscopic procedure).   Will continue supportive medical therapy with antiacid therapy, pantoprazole IV and sucralfate, and clears. If recurrent bleeding may need IR or surgery intervention.  Continue pain control with tramadol and resume bentyl.   Patient continue to be at high risk for recurrent bleeding and worsening anemia.   2. Atrial fibrillation with RVR. Patient with rapid ventricular response during hemodialysis, required IV metoprolol. Now she has converted to sinus rhythm with oral metoprolol 100 mg bid per her home dose. Not candidate for anticoagulation due to GI bleeding and acute anemia blood loss. Continue telemetry monitoring.  3. ESRD on HD/ anemia of chronic renal disease with iron deficiency. HD access tunneled catheter right IJ, had HD today, clinically now euvolemic, will continue to follow with nephrology recommendations in regards of next HD. Her schedule is T, TH and Sat, she missed last HD on TH.  Resume oral iron and doxercalciferol.   4. COPD. No signs of acute exacerbation, will continue oxymetry monitoring and as needed bronchodilators/ inhaled steroids.   5. HTN. Blood pressure 115 to 767 mmHg systolic, will continue to hold on amlodipine and isosorbide for now.  6. Gout. Continue with allopurinol.   7. Dyslipidemia. Continue with pravastatin.   DVT  prophylaxis: scd   Code Status:  full Family Communication: no family at the bedside  Disposition Plan/ discharge barriers: patient continue acutely ill     Consultants:   Nephrology   GI  Surgery   Procedures:   EGD   Antimicrobials:       Subjective: Patient is feeling better, but not yet back to baseline, continue to have abdominal discomfort but no nausea or vomiting, no further melena or hematemesis.   Objective: Vitals:   03/25/19 2319 03/26/19 0341 03/26/19 0500 03/26/19 0603  BP: (!) 144/58 (!) 152/59    Pulse: 82 85  94  Resp: 19 14  20   Temp: 98.3 F (36.8 C) 98 F (36.7 C)    TempSrc: Oral Oral    SpO2: 100% 100%  100%  Weight:   46.2 kg   Height:        Intake/Output Summary (Last 24 hours) at 03/26/2019 0815 Last data filed at 03/26/2019 0500 Gross per 24 hour  Intake 1069.91 ml  Output 300 ml  Net 769.91 ml   Filed Weights   03/25/19 0621 03/26/19 0500  Weight: 49 kg 46.2 kg    Examination:   General: Not in pain or dyspnea. Deconditioned  Neurology: Awake and alert, non focal  E ENT: positive pallor, no icterus, oral mucosa moist Cardiovascular: No JVD. S1-S2 present, rhythmic, no gallops, rubs, or murmurs. No lower extremity edema. Pulmonary: positive breath sounds bilaterally, decreased air movement, no wheezing, rhonchi or rales. Gastrointestinal. Abdomen with no organomegaly, non tender, no rebound or guarding Skin. No rashes Musculoskeletal: no joint deformities HD tunneled catheter right IJ     Data Reviewed: I have personally reviewed following labs and imaging studies  CBC: Recent Labs  Lab 03/21/19 1029 03/21/19 1029 03/22/19 0802 03/25/19 0611 03/25/19 1616 03/25/19 2007 03/26/19 0236  WBC 9.1   < > 11.3* 10.8* 14.7* 14.7* 11.8*  NEUTROABS 7.5  --  9.6* 8.1*  --   --   --   HGB 7.7*   < > 7.8* 5.1* 10.0* 9.0* 8.3*  HCT 25.2*   < > 25.7* 16.8* 30.1* 26.9* 25.3*  MCV 97.3   < > 100.4* 100.0 92.0 90.6 92.3  PLT  224   < > 211 218 184 189 184   < > = values in this interval not displayed.   Basic Metabolic Panel: Recent Labs  Lab 03/20/19 0300 03/21/19 1029 03/22/19 0802 03/25/19 0611 03/25/19 1313 03/26/19 0236  NA 133* 138 139 136  --  140  K 5.4* 3.2* 3.3* 4.0  --  4.5  CL 97* 98 100 98  --  106  CO2 19* 23 22 16*  --  19*  GLUCOSE 92 189* 230* 174*  --  102*  BUN 100* 29* 40* 80*  --  75*  CREATININE 7.30* 3.80* 5.13* 5.62*  --  5.93*  CALCIUM 8.2* 7.3* 7.4* 8.3*  --  8.4*  MG  --   --  1.4*  --  1.9 1.9  PHOS  --  6.2*  --   --  6.2* 6.5*   GFR: Estimated Creatinine Clearance: 6.6 mL/min (A) (by C-G formula based on SCr of 5.93 mg/dL (H)). Liver Function Tests: Recent Labs  Lab 03/19/19 1400 03/20/19 0300 03/21/19 1029 03/25/19 0611 03/26/19 0236  AST 24 16  --  30 33  ALT 17 12  --  20 25  ALKPHOS 85 65  --  53 49  BILITOT 0.6 0.7  --  0.5 0.8  PROT 6.5 5.0*  --  4.7* 4.4*  ALBUMIN 3.3* 2.5* 2.5* 2.4* 2.2*   Recent Labs  Lab 03/19/19 1400 03/25/19 0611 03/25/19 1313 03/26/19 0236  LIPASE 49 64*  --  64*  AMYLASE  --   --  97  --    No results for input(s): AMMONIA in the last 168 hours. Coagulation Profile: Recent Labs  Lab 03/25/19 1313  INR 1.1   Cardiac Enzymes: No results for input(s): CKTOTAL, CKMB, CKMBINDEX, TROPONINI in the last 168 hours. BNP (last 3 results) No results for input(s): PROBNP in the last 8760 hours. HbA1C: No results for input(s): HGBA1C in the last 72 hours. CBG: No results for input(s): GLUCAP in the last 168 hours. Lipid Profile: No results for input(s): CHOL, HDL, LDLCALC, TRIG, CHOLHDL, LDLDIRECT in the last 72 hours. Thyroid Function Tests: No results for input(s): TSH, T4TOTAL, FREET4, T3FREE, THYROIDAB in the last 72 hours. Anemia Panel: No results for input(s): VITAMINB12, FOLATE, FERRITIN, TIBC, IRON, RETICCTPCT in the last 72 hours.    Radiology Studies: I have reviewed all of the imaging during this hospital  visit personally     Scheduled Meds: . metoprolol tartrate      . arformoterol  15 mcg Nebulization BID  . budesonide (PULMICORT) nebulizer solution  0.5 mg Nebulization BID  . Chlorhexidine Gluconate Cloth  6 each Topical Q0600  . doxercalciferol  2 mcg Intravenous Q T,Th,Sa-HD  . ipratropium-albuterol  3 mL Nebulization BID  . pantoprazole (PROTONIX) IV  40 mg Intravenous QHS   Continuous Infusions: . sodium chloride 10 mL/hr at 03/25/19 1607  . sodium chloride 20 mL/hr at 03/26/19 0305  . sodium chloride    . sodium chloride       LOS: 1 day        Tron Flythe Gerome Apley, MD

## 2019-03-26 NOTE — Anesthesia Postprocedure Evaluation (Signed)
Anesthesia Post Note  Patient: Christina [name]  Procedure(s) Performed: ESOPHAGOGASTRODUODENOSCOPY (EGD) WITH PROPOFOL (N/A ) BIOPSY     Patient location during evaluation: PACU Anesthesia Type: MAC Level of consciousness: awake and alert Pain management: pain level controlled Vital Signs Assessment: post-procedure vital signs reviewed and stable Respiratory status: spontaneous breathing, nonlabored ventilation, respiratory function stable and patient connected to nasal cannula oxygen Cardiovascular status: stable and blood pressure returned to baseline Postop Assessment: no apparent nausea or vomiting Anesthetic complications: no    Last Vitals:  Vitals:   03/26/19 1310 03/26/19 1320  BP: (!) 115/55 (!) 124/50  Pulse:    Resp: 20 16  Temp: (!) 35.2 C   SpO2: 96% 95%    Last Pain:  Vitals:   03/26/19 1320  TempSrc:   PainSc: 0-No pain                 Effie Berkshire

## 2019-03-26 NOTE — Plan of Care (Signed)

## 2019-03-26 NOTE — Transfer of Care (Signed)
Immediate Anesthesia Transfer of Care Note  Patient: Christina [name]  Procedure(s) Performed: ESOPHAGOGASTRODUODENOSCOPY (EGD) WITH PROPOFOL (N/A ) BIOPSY  Patient Location: Endoscopy Unit  Anesthesia Type:MAC  Level of Consciousness: awake, alert  and oriented  Airway & Oxygen Therapy: Patient Spontanous Breathing and Patient connected to nasal cannula oxygen  Post-op Assessment: Report given to RN and Post -op Vital signs reviewed and stable  Post vital signs: Reviewed and stable  Last Vitals:  Vitals Value Taken Time  BP    Temp    Pulse    Resp    SpO2      Last Pain:  Vitals:   03/26/19 1202  TempSrc: Oral  PainSc: 0-No pain         Complications: No apparent anesthesia complications

## 2019-03-27 DIAGNOSIS — R1084 Generalized abdominal pain: Secondary | ICD-10-CM

## 2019-03-27 DIAGNOSIS — N186 End stage renal disease: Secondary | ICD-10-CM

## 2019-03-27 DIAGNOSIS — I4891 Unspecified atrial fibrillation: Secondary | ICD-10-CM

## 2019-03-27 DIAGNOSIS — J449 Chronic obstructive pulmonary disease, unspecified: Secondary | ICD-10-CM

## 2019-03-27 LAB — CBC WITH DIFFERENTIAL/PLATELET
Abs Immature Granulocytes: 0.07 10*3/uL (ref 0.00–0.07)
Basophils Absolute: 0 10*3/uL (ref 0.0–0.1)
Basophils Relative: 0 %
Eosinophils Absolute: 0.3 10*3/uL (ref 0.0–0.5)
Eosinophils Relative: 3 %
HCT: 24.6 % — ABNORMAL LOW (ref 36.0–46.0)
Hemoglobin: 7.8 g/dL — ABNORMAL LOW (ref 12.0–15.0)
Immature Granulocytes: 1 %
Lymphocytes Relative: 13 %
Lymphs Abs: 1.3 10*3/uL (ref 0.7–4.0)
MCH: 30.2 pg (ref 26.0–34.0)
MCHC: 31.7 g/dL (ref 30.0–36.0)
MCV: 95.3 fL (ref 80.0–100.0)
Monocytes Absolute: 0.8 10*3/uL (ref 0.1–1.0)
Monocytes Relative: 8 %
Neutro Abs: 7.3 10*3/uL (ref 1.7–7.7)
Neutrophils Relative %: 75 %
Platelets: 150 10*3/uL (ref 150–400)
RBC: 2.58 MIL/uL — ABNORMAL LOW (ref 3.87–5.11)
RDW: 16.5 % — ABNORMAL HIGH (ref 11.5–15.5)
WBC: 9.7 10*3/uL (ref 4.0–10.5)
nRBC: 0.4 % — ABNORMAL HIGH (ref 0.0–0.2)

## 2019-03-27 LAB — BASIC METABOLIC PANEL
Anion gap: 7 (ref 5–15)
BUN: 15 mg/dL (ref 8–23)
CO2: 26 mmol/L (ref 22–32)
Calcium: 7.7 mg/dL — ABNORMAL LOW (ref 8.9–10.3)
Chloride: 98 mmol/L (ref 98–111)
Creatinine, Ser: 2.81 mg/dL — ABNORMAL HIGH (ref 0.44–1.00)
GFR calc Af Amer: 19 mL/min — ABNORMAL LOW (ref >60)
GFR calc non Af Amer: 17 mL/min — ABNORMAL LOW (ref >60)
Glucose, Bld: 91 mg/dL (ref 70–99)
Potassium: 4 mmol/L (ref 3.5–5.1)
Sodium: 131 mmol/L — ABNORMAL LOW (ref 135–145)

## 2019-03-27 MED ORDER — CAMPHOR-MENTHOL 0.5-0.5 % EX LOTN
TOPICAL_LOTION | CUTANEOUS | Status: DC | PRN
  Filled 2019-03-27: qty 222

## 2019-03-27 NOTE — Progress Notes (Signed)
1 Day Post-Op    YC:[id number] pain  Subjective: Pt is pain free this AM.  No complaints  Objective: Vital signs in last 24 hours: Temp:  [95.4 F (35.2 C)-99 F (37.2 C)] 98.6 F (37 C) (02/28 1126) Pulse Rate:  [64-74] 68 (02/28 1127) Resp:  [14-20] 17 (02/28 1127) BP: (100-145)/(50-67) 145/60 (02/28 1127) SpO2:  [91 %-100 %] 100 % (02/28 1127) FiO2 (%):  [21 %] 21 % (02/28 0751) Weight:  [44.5 kg] 44.5 kg (02/28 0308) Last BM Date: 03/25/19  660.  P.o. 660 IV Urine 3 high Dialysis 700 Afebrile vital signs are stable H/H   10/30>> 9.0/26.9 >>8.3/25.3>>7.8/24.6 Intake/Output from previous day: 02/27 0701 - 02/28 0700 In: 1310 [P.O.:660; I.V.:650] Out: 1050 [Urine:300] Intake/Output this shift: Total I/O In: 240 [P.O.:240] Out: -   General appearance: alert, cooperative and no distress Resp: clear to auscultation bilaterally GI: soft, non-tender; bowel sounds normal; no masses,  no organomegaly  Lab Results:  Recent Labs    03/26/19 0236 03/26/19 0236 03/26/19 1158 03/27/19 0221  WBC 11.8*  --   --  9.7  HGB 8.3*   < > 9.5* 7.8*  HCT 25.3*   < > 28.0* 24.6*  PLT 184  --   --  150   < > = values in this interval not displayed.    BMET Recent Labs    03/26/19 0236 03/26/19 0236 03/26/19 1158 03/27/19 0221  NA 140   < > 134* 131*  K 4.5   < > 3.5 4.0  CL 106   < > 94* 98  CO2 19*  --   --  26  GLUCOSE 102*   < > 89 91  BUN 75*   < > 11 15  CREATININE 5.93*   < > 1.60* 2.81*  CALCIUM 8.4*  --   --  7.7*   < > = values in this interval not displayed.   PT/INR Recent Labs    03/25/19 1313  LABPROT 14.3  INR 1.1    Recent Labs  Lab 03/21/19 1029 03/25/19 0611 03/26/19 0236  AST  --  30 33  ALT  --  20 25  ALKPHOS  --  53 49  BILITOT  --  0.5 0.8  PROT  --  4.7* 4.4*  ALBUMIN 2.5* 2.4* 2.2*     Lipase     Component Value Date/Time   LIPASE 64 (H) 03/26/2019 0236     Medications: . allopurinol  100 mg Oral Daily  . arformoterol   15 mcg Nebulization BID  . budesonide (PULMICORT) nebulizer solution  0.5 mg Nebulization BID  . Chlorhexidine Gluconate Cloth  6 each Topical Q0600  . cholecalciferol  2,000 Units Oral Daily  . dicyclomine  20 mg Oral QHS  . doxercalciferol  2 mcg Intravenous Q T,Th,Sa-HD  . ferric citrate  630 mg Oral TID WC  . fluticasone furoate-vilanterol  1 puff Inhalation Daily  . gabapentin  300 mg Oral QHS  . ipratropium-albuterol  3 mL Nebulization BID  . metoprolol tartrate  100 mg Oral BID  . pantoprazole (PROTONIX) IV  40 mg Intravenous Q12H  . pravastatin  80 mg Oral QPM  . sucralfate  1 g Oral Q6H  . traMADol  50 mg Oral Once  - LA Grade B esophagitis with no bleeding. - 2 cm hiatal hernia. - Gastroesophageal flap valve classified as Hill Grade IV (no fold, wide open lumen, hiatal hernia present). - Gastritis. Biopsied. - Multiple  duodenal ulcers limited to the duodenal bulb. The largest was 15 mm and deeply cratered with pigmented material. This ulcer would be too large for successful endoscopic intervention. If concern for bleeding , recommend IR or surgical consultation. The CTA findings along with these endoscopic findings are concerning for ischemic etiology. The area was biopsied. - Normal second portion of the duodenum. - Clear liquid diet only today. - Use Protonix (pantoprazole) 40 mg IV BID. - Use sucralfate suspension 1 gram PO QID. - Continue serial H/H checks with blood products per protocol Assessment/Plan Hypertension Hyperlipidemia CAD (stent x2) COPD Paroxysmal A. 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 She seen previously for concerns of a calculus cholecystitis with a negative HIDA scan. There was some concerns for peptic ulcer disease  given the duodenal thickening seen on prior CT scans.  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 Results report patient is a high risk for mesenteric ischemia. There is no overt signs of ischemia on today's scan. No pneumoperitoneum or free air. Lactic acid is now normal after hydration. Her pain is not out of proportion on exam and she is without peritonitis. No role for acute surgical intervention at this time. Primary team could discuss with vascular to see if there is any options for chronic above findings. -Gastric thickening noted on scan. Her duodenum was noted be thickened with adjacent fat stranding on 02/27/2019 CT.  EGD findings:LA Grade B esophagitis with no bleeding. 2 cm hiatal hernia., Gastroesophageal flap valve classified as Hill Grade IV (no fold, wide open lumen, hiatal hernia present). - Gastritis. Biopsied. - Multiple duodenal ulcers limited to the duodenal bulb. The largest was 15 mm and deeply cratered with pigmented material. This ulcer would be too large for successful endoscopic intervention. . sodium chloride 10 mL/hr at 03/25/19 1607   Anti-infectives (From admission, onward)   None      FEN: Clear liquids ID:  None DVT:  SCD's Follow up:  Currently none   PLan:  No surgical need currently.  Dropping H/H will follow with you.      LOS: 2 days    Wilmina Maxham 03/27/2019 Please see Amion

## 2019-03-27 NOTE — Progress Notes (Signed)
PROGRESS NOTE    GEMA RINGOLD  TML:[id number] DOB: 1950/04/11 DOA: 03/25/2019 PCP: Maggie Schwalbe, PA-C    Brief Narrative:  Patient was admitted to the hospital with the working diagnosis of ischemic colitis, complicated with severe acute blood loss anemia due to upper gi bleed.   69 year old female with a past medical history for end-stage renal disease on hemodialysis (Tuesday, Thursday, Saturday), hypertension, atrial fibrillation, coronary disease, COPD and history of small bowel obstructions.  Patient presented with abdominal pain and chest pain, associated with diarrhea and weakness.  Apparently she missed her last hemodialysis session on 02/25.  Her initial physical examination her blood pressure was 135/73, heart rate 124, temperature 98, respiratory 22, oxygen saturation 100%.  She had coarse breath sounds bilaterally, diminished at bases, no wheezing, heart S1-S2 present, irregularly irregular, abdomen tender to palpation, distended, no rebound, positive bowel sounds, no lower extremity edema. Sodium 136, potassium 4.0, chloride 98, bicarb 16, glucose 174, BUN 80, creatinine 5.6, troponin I 55, white count 10.8, hemoglobin 5.1, hematocrit 16.8, platelets 218.  SARS COVID-19 negative.  Urine analysis negative for infection.  Drug screen positive for cocaine. 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  Chest radiograph with hyperinflation, hilar vascular congestion.  EKG 125 bpm, normal axis, normal QRS, atrial fibrillation rhythm, no ST segment changes, inferior lateral T wave inversions.  Received 2 units packed red blood cells, with good toleration.  02/27 patient underwent hemodialysis and upper endoscopy.  Surgery recommended conservative medical therapy.  Upper endoscopy 02/27, found a large duodenal ulcer,  patient placed on IV proton pump inhibitor and sucralfate. Atrial fibrillation with RVR converted to sinus rhythm with AV blockade.     Assessment & Plan:   Principal Problem:   Ischemic bowel disease ([place]) Active Problems:   ESRD (end stage renal disease) ([place])   Bipolar affective ([place])   CAD (coronary artery disease), native coronary artery   COPD with chronic bronchitis (H[facility])   CVA (cerebral vascular accident) ([place])   Essential hypertension   Hyperlipidemia   Anemia due to chronic blood loss   Afib (HCC)   AF (paroxysmal atrial fibrillation) (H[facility])   Duodenal ulcer   Gastritis and gastroduodenitis   Acute blood loss anemia   1. Acute blood loss anemia due to upper GI bleed, ischemic colitis and duodenal ulcers. sp two unit PRBC transfusion.  Upper endoscopy with positive gastritis and duodenal ulcer (to large for endoscopic procedure).   Hgb this am 7,8 with Hct at 24,6, with no frank hematemesis or melena.   Continue medical therapy with antiacid therapy, pantoprazole IV bid and sucralfate qid. Continue with clear liquid diet for now. Hold on PRBC transfusion today, target Hgb above 7. Ideally PRBC transfusion during next HD if possible.    On tramadol and bentyl for abdominal pain.   Continue at high risk for recurrent bleeding and worsening anemia.   2. Atrial fibrillation with RVR. Continue rate control with metoprolol 100 mg bid. No candidate for anticoagulation due to gi bleeding. Will transfer to cardiac telemetry unit.   3. ESRD on HD/ anemia of chronic renal disease with iron deficiency. HD access tunneled catheter right IJ, clinically euvolemic, will follow with nephrology for next HD.  Continue with oral iron and doxercalciferol.   4. COPD. No clinical signs of acute exacerbation, on as needed bronchodilators/ inhaled steroids.  5. HTN. At home on amlodipine and isosorbide, continue holding antihypertensive medications.  6. Gout. No acute flare, On  allopurinol.   7. Dyslipidemia. On pravastatin.   DVT prophylaxis: scd   Code Status:  full Family Communication: no family at the bedside  Disposition Plan/ discharge barriers: patient continue acutely ill    Consultants:   Nephrology   Surgery   GI   Procedures:   Upper endoscopy     Subjective: Patient continue to have abdominal pain, dull in nature, improved with analgesics, no associated nausea or vomiting, tolerating po well.   Objective: Vitals:   03/27/19 0736 03/27/19 0739 03/27/19 0751 03/27/19 0821  BP:    (!) 100/54  Pulse:      Resp:      Temp:    98.2 F (36.8 C)  TempSrc:    Oral  SpO2: 93% 95% 93% 93%  Weight:      Height:        Intake/Output Summary (Last 24 hours) at 03/27/2019 0853 Last data filed at 03/26/2019 1926 Gross per 24 hour  Intake 1310 ml  Output 1050 ml  Net 260 ml   Filed Weights   03/26/19 0500 03/26/19 1202 03/27/19 0308  Weight: 46.2 kg 49 kg 44.5 kg    Examination:   General: deconditioned  Neurology: Awake and alert, non focal  E ENT: mild pallor, no icterus, oral mucosa moist Cardiovascular: No JVD. S1-S2 present, rhythmic, no gallops, rubs, or murmurs. No lower extremity edema. Pulmonary: positive breath sounds bilaterally, adequate air movement, no wheezing, rhonchi or rales. Gastrointestinal. Abdomen with no organomegaly, non tender, no rebound or guarding Skin. No rashes Musculoskeletal: no joint deformities HD access right IJ tunneled catheter.     Data Reviewed: I have personally reviewed following labs and imaging studies  CBC: Recent Labs  Lab 03/21/19 1029 03/21/19 1029 03/22/19 0802 03/22/19 0802 03/25/19 0611 03/25/19 0611 03/25/19 1616 03/25/19 2007 03/26/19 0236 03/26/19 1158 03/27/19 0221  WBC 9.1   < > 11.3*   < > 10.8*  --  14.7* 14.7* 11.8*  --  9.7  NEUTROABS 7.5  --  9.6*  --  8.1*  --   --   --   --   --  7.3  HGB 7.7*   < > 7.8*   < > 5.1*   < > 10.0* 9.0* 8.3* 9.5* 7.8*    HCT 25.2*   < > 25.7*   < > 16.8*   < > 30.1* 26.9* 25.3* 28.0* 24.6*  MCV 97.3   < > 100.4*   < > 100.0  --  92.0 90.6 92.3  --  95.3  PLT 224   < > 211   < > 218  --  184 189 184  --  150   < > = values in this interval not displayed.   Basic Metabolic Panel: Recent Labs  Lab 03/21/19 1029 03/21/19 1029 03/22/19 0802 03/25/19 0611 03/25/19 1313 03/26/19 0236 03/26/19 1158 03/27/19 0221  NA 138   < > 139 136  --  140 134* 131*  K 3.2*   < > 3.3* 4.0  --  4.5 3.5 4.0  CL 98   < > 100 98  --  106 94* 98  CO2 23  --  22 16*  --  19*  --  26  GLUCOSE 189*   < > 230* 174*  --  102* 89 91  BUN 29*   < > 40*  80*  --  75* 11 15  CREATININE 3.80*   < > 5.13* 5.62*  --  5.93* 1.60* 2.81*  CALCIUM 7.3*  --  7.4* 8.3*  --  8.4*  --  7.7*  MG  --   --  1.4*  --  1.9 1.9  --   --   PHOS 6.2*  --   --   --  6.2* 6.5*  --   --    < > = values in this interval not displayed.   GFR: Estimated Creatinine Clearance: 13.5 mL/min (A) (by C-G formula based on SCr of 2.81 mg/dL (H)). Liver Function Tests: Recent Labs  Lab 03/21/19 1029 03/25/19 0611 03/26/19 0236  AST  --  30 33  ALT  --  20 25  ALKPHOS  --  53 49  BILITOT  --  0.5 0.8  PROT  --  4.7* 4.4*  ALBUMIN 2.5* 2.4* 2.2*   Recent Labs  Lab 03/25/19 0611 03/25/19 1313 03/26/19 0236  LIPASE 64*  --  64*  AMYLASE  --  97  --    No results for input(s): AMMONIA in the last 168 hours. Coagulation Profile: Recent Labs  Lab 03/25/19 1313  INR 1.1   Cardiac Enzymes: No results for input(s): CKTOTAL, CKMB, CKMBINDEX, TROPONINI in the last 168 hours. BNP (last 3 results) No results for input(s): PROBNP in the last 8760 hours. HbA1C: No results for input(s): HGBA1C in the last 72 hours. CBG: No results for input(s): GLUCAP in the last 168 hours. Lipid Profile: No results for input(s): CHOL, HDL, LDLCALC, TRIG, CHOLHDL, LDLDIRECT in the last 72 hours. Thyroid Function Tests: No results for input(s): TSH, T4TOTAL, FREET4,  T3FREE, THYROIDAB in the last 72 hours. Anemia Panel: No results for input(s): VITAMINB12, FOLATE, FERRITIN, TIBC, IRON, RETICCTPCT in the last 72 hours.    Radiology Studies: I have reviewed all of the imaging during this hospital visit personally     Scheduled Meds: . allopurinol  100 mg Oral Daily  . arformoterol  15 mcg Nebulization BID  . budesonide (PULMICORT) nebulizer solution  0.5 mg Nebulization BID  . Chlorhexidine Gluconate Cloth  6 each Topical Q0600  . cholecalciferol  2,000 Units Oral Daily  . dicyclomine  20 mg Oral QHS  . doxercalciferol  2 mcg Intravenous Q T,Th,Sa-HD  . ferric citrate  630 mg Oral TID WC  . fluticasone furoate-vilanterol  1 puff Inhalation Daily  . gabapentin  300 mg Oral QHS  . ipratropium-albuterol  3 mL Nebulization BID  . metoprolol tartrate  100 mg Oral BID  . pantoprazole (PROTONIX) IV  40 mg Intravenous Q12H  . pravastatin  80 mg Oral QPM  . sucralfate  1 g Oral Q6H  . traMADol  50 mg Oral Once   Continuous Infusions: . sodium chloride 10 mL/hr at 03/25/19 1607     LOS: 2 days        Tadashi Burkel Gerome Apley, MD

## 2019-03-27 NOTE — Progress Notes (Signed)
[facility] ROUNDING NOTE   Subjective: Tachycardia and abdominal pain during dialysis yesterday, but otherwise no acute events.  Sitting upright and having breakfast (liquids) this morning.  No complaints today.  No BM.  No overt GI blood loss.  Abdominal pain resolved.  EGD completed on 2/27 notable for LA Grade B esophagitis, 2 cm hiatal hernia, mild gastritis, and multiple cratered duodenal ulcers/duodenitis, the largest being 15 mm and deeply cratered with pigmented material at the base.  This lesion was much too large for endoscopic intervention.  Biopsies pending.    Objective: Vital signs in last 24 hours: Temp:  [98.2 F (36.8 C)-99 F (37.2 C)] 98.6 F (37 C) (02/28 1126) Pulse Rate:  [64-74] 68 (02/28 1300) Resp:  [14-20] 17 (02/28 1127) BP: (100-145)/(51-67) 123/59 (02/28 1300) SpO2:  [90 %-100 %] 90 % (02/28 1300) FiO2 (%):  [21 %] 21 % (02/28 0751) Weight:  [44.5 kg] 44.5 kg (02/28 0308) Last BM Date: 03/25/19 General: NAD Abdomen:  Soft, NT, ND, +BS    Intake/Output from previous day: 02/27 0701 - 02/28 0700 In: 1310 [P.O.:660; I.V.:650] Out: 1050 [Urine:300] Intake/Output this shift: Total I/O In: 480 [P.O.:480] Out: 500 [Urine:500]   Lab Results: Recent Labs    03/25/19 2007 03/25/19 2007 03/26/19 0236 03/26/19 1158 03/27/19 0221  WBC 14.7*  --  11.8*  --  9.7  HGB 9.0*   < > 8.3* 9.5* 7.8*  PLT 189  --  184  --  150  MCV 90.6  --  92.3  --  95.3   < > = values in this interval not displayed.   BMET Recent Labs    03/25/19 0611 03/25/19 0611 03/26/19 0236 03/26/19 1158 03/27/19 0221  NA 136   < > 140 134* 131*  K 4.0   < > 4.5 3.5 4.0  CL 98   < > 106 94* 98  CO2 16*  --  19*  --  26  GLUCOSE 174*   < > 102* 89 91  BUN 80*   < > 75* 11 15  CREATININE 5.62*   < > 5.93* 1.60* 2.81*  CALCIUM 8.3*  --  8.4*  --  7.7*   < > = values in this interval not displayed.   LFT Recent Labs    03/25/19 0611 03/26/19 0236  PROT  4.7* 4.4*  ALBUMIN 2.4* 2.2*  AST 30 33  ALT 20 25  ALKPHOS 53 49  BILITOT 0.5 0.8   PT/INR Recent Labs    03/25/19 1313  INR 1.1      Imaging/Other results: DG CHEST PORT 1 VIEW  Result Date: 03/26/2019 CLINICAL DATA:  Acute respiratory failure. EXAM: PORTABLE CHEST 1 VIEW COMPARISON:  03/25/2019 FINDINGS: 0609 hours. Lungs are hyperexpanded. The cardio pericardial silhouette is enlarged. Interstitial markings are diffusely coarsened with chronic features. The lungs are clear without focal pneumonia, edema, pneumothorax or pleural effusion. Prominent skin fold over the left hemithorax. Right IJ central line tip overlies the SVC/RA junction possibly just into the upper right atrium. The visualized bony structures of the thorax are intact. Telemetry leads overlie the chest. IMPRESSION: Hyperexpansion with chronic interstitial coarsening. No acute cardiopulmonary findings. Electronically Signed   By: Misty Stanley M.D.   On: 03/26/2019 08:31      Assessment and Plan:  1) Duodenal ulcers 2) Gastritis 3) Erosive esophagitis 4) Chronic occlusion of SMA, IMA 5) ESRD HD dependent 6) Atrial fibrillation 7) CAD 22) COPD  69 year old female with  multiple medical problems including vasculopathy including chronically occluded SMA, IMA and UDS positive for cocaine on admission.  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  EGD 2/27 with multiple duodenal ulcers, the largest of which was 15 mm with pigmented material in the ulcer base.  Lesion was much too large for endoscopic intervention.  Repeat hemoglobin 7.8 this morning.  Was 9.5 yesterday, 8.3 the day prior.  Transfused 2 units on this admission (admission hemoglobin 5).  -Continue high-dose PPI and Carafate -Tolerating full liquid diet -Continue serial H/H checks with additional blood products per protocol -If concern for UGI bleed, given appearance and size of  duodenal ulcers, strong consideration for IR intervention -GI service will continue to follow.  Dr. Benson Norway will assume care of this patient tomorrow morning    Lavena Bullion, DO  03/27/2019, 1:40 PM [facility] Pager [phone]

## 2019-03-27 NOTE — Plan of Care (Signed)

## 2019-03-27 NOTE — Progress Notes (Signed)
[facility] Progress Note   Subjective: seen in room, had rough HD yest w/ abd pain, afib/ RVR but we got about 3hrs of HD w/ 750 cc off. No sob today  Objective Vitals:   03/27/19 0751 03/27/19 0821 03/27/19 1126 03/27/19 1127  BP:  (!) 100/54  (!) 145/60  Pulse:    68  Resp:    17  Temp:  98.2 F (36.8 C) 98.6 F (37 C)   TempSrc:  Oral Oral   SpO2: 93% 93%  100%  Weight:      Height:       Physical Exam General:Chronically ill appearing female in NAD Heart: S1,S2 irreg, irreg 1/6 systolic M. AFib on monitor rate 95-100s Lungs: CTAB A/P Abdomen: S, NT, no ascites, active BS Extremities: No LE edema Dialysis Access: RIJ TDC Drsg CDI  HD orders: [place] T,Th,S 4 hrs 180NRe 250/Autoflow 1.5 44 kg 2.0 K/2.0 Ca UFP 2 -No heparin -Venofer 50 mg IV weekly (last dose 100 mg IV 03/22/19) -Calcitriol 1.0 mcg PO TIW  Assessment/Plan: 1. GIB: HGB 5.1 on adm > SP 2 units PRBCs. EGD showed large cratered duod ulcer, poss ischemic. Hb 7.8 after 2u prbc.  2. Abdominal/Chest pain. Gen. Surgery consulted, noted chronic IMA/ SMA occlusion per CTA abd. Will keep SBP >100 on HD 3. ESRD - TTS HD. Missed and truncated treatments. Next HD 3/2. 4. Anemia -As noted above. Mircera 30 mcg IV 03/15/2019. No ESA ordered in [place]. Follow HGB, may need to redose ESA. 5. Secondary hyperparathyroidism -Binders on hold while NPO. Use IV Hectorol until able to take PO meds.  6. HTN/volume - at dry wt, no ^vol on exam 7. Nutrition-NPO at present. Very low albumin. Renal diet with prostat, renal vits when able to eat.  8. COPD-per primary 9. Substance abuse - +cocaine  Kelly Splinter, MD 03/27/2019, 12:14 PM      Additional Objective Labs: Basic Metabolic Panel: Recent Labs  Lab 03/21/19 1029 03/22/19 0802 03/25/19 0174 03/25/19 0611 03/25/19 1313 03/26/19 0236 03/26/19 1158 03/27/19 0221  NA 138   < > 136   < >  --  140 134* 131*  K 3.2*   < > 4.0   < >  --  4.5 3.5 4.0  CL  98   < > 98   < >  --  106 94* 98  CO2 23   < > 16*  --   --  19*  --  26  GLUCOSE 189*   < > 174*   < >  --  102* 89 91  BUN 29*   < > 80*   < >  --  75* 11 15  CREATININE 3.80*   < > 5.62*   < >  --  5.93* 1.60* 2.81*  CALCIUM 7.3*   < > 8.3*  --   --  8.4*  --  7.7*  PHOS 6.2*  --   --   --  6.2* 6.5*  --   --    < > = values in this interval not displayed.   Medications: . sodium chloride 10 mL/hr at 03/25/19 1607   . allopurinol  100 mg Oral Daily  . arformoterol  15 mcg Nebulization BID  . budesonide (PULMICORT) nebulizer solution  0.5 mg Nebulization BID  . Chlorhexidine Gluconate Cloth  6 each Topical Q0600  . cholecalciferol  2,000 Units Oral Daily  . dicyclomine  20 mg Oral QHS  . doxercalciferol  2 mcg Intravenous Q T,Th,Sa-HD  . ferric citrate  630 mg Oral TID WC  . fluticasone furoate-vilanterol  1 puff Inhalation Daily  . gabapentin  300 mg Oral QHS  . ipratropium-albuterol  3 mL Nebulization BID  . metoprolol tartrate  100 mg Oral BID  . pantoprazole (PROTONIX) IV  40 mg Intravenous Q12H  . pravastatin  80 mg Oral QPM  . sucralfate  1 g Oral Q6H  . traMADol  50 mg Oral Once

## 2019-03-28 ENCOUNTER — Other Ambulatory Visit: Payer: Self-pay | Admitting: Physician Assistant

## 2019-03-28 DIAGNOSIS — I2510 Atherosclerotic heart disease of native coronary artery without angina pectoris: Secondary | ICD-10-CM

## 2019-03-28 LAB — CBC WITH DIFFERENTIAL/PLATELET
Abs Immature Granulocytes: 0.05 10*3/uL (ref 0.00–0.07)
Basophils Absolute: 0 10*3/uL (ref 0.0–0.1)
Basophils Relative: 0 %
Eosinophils Absolute: 0.2 10*3/uL (ref 0.0–0.5)
Eosinophils Relative: 3 %
HCT: 28.1 % — ABNORMAL LOW (ref 36.0–46.0)
Hemoglobin: 8.9 g/dL — ABNORMAL LOW (ref 12.0–15.0)
Immature Granulocytes: 1 %
Lymphocytes Relative: 16 %
Lymphs Abs: 1.1 10*3/uL (ref 0.7–4.0)
MCH: 30.9 pg (ref 26.0–34.0)
MCHC: 31.7 g/dL (ref 30.0–36.0)
MCV: 97.6 fL (ref 80.0–100.0)
Monocytes Absolute: 0.8 10*3/uL (ref 0.1–1.0)
Monocytes Relative: 11 %
Neutro Abs: 5 10*3/uL (ref 1.7–7.7)
Neutrophils Relative %: 69 %
Platelets: 154 10*3/uL (ref 150–400)
RBC: 2.88 MIL/uL — ABNORMAL LOW (ref 3.87–5.11)
RDW: 16.2 % — ABNORMAL HIGH (ref 11.5–15.5)
WBC: 7.3 10*3/uL (ref 4.0–10.5)
nRBC: 0.3 % — ABNORMAL HIGH (ref 0.0–0.2)

## 2019-03-28 LAB — BASIC METABOLIC PANEL
Anion gap: 12 (ref 5–15)
BUN: 19 mg/dL (ref 8–23)
CO2: 21 mmol/L — ABNORMAL LOW (ref 22–32)
Calcium: 7.9 mg/dL — ABNORMAL LOW (ref 8.9–10.3)
Chloride: 96 mmol/L — ABNORMAL LOW (ref 98–111)
Creatinine, Ser: 3.89 mg/dL — ABNORMAL HIGH (ref 0.44–1.00)
GFR calc Af Amer: 13 mL/min — ABNORMAL LOW (ref >60)
GFR calc non Af Amer: 11 mL/min — ABNORMAL LOW (ref >60)
Glucose, Bld: 89 mg/dL (ref 70–99)
Potassium: 3.7 mmol/L (ref 3.5–5.1)
Sodium: 129 mmol/L — ABNORMAL LOW (ref 135–145)

## 2019-03-28 LAB — PHOSPHORUS: Phosphorus: 3.5 mg/dL (ref 2.5–4.6)

## 2019-03-28 MED ORDER — DARBEPOETIN ALFA 100 MCG/0.5ML IJ SOSY
100.0000 ug | PREFILLED_SYRINGE | INTRAMUSCULAR | Status: DC
  Filled 2019-03-28: qty 0.5

## 2019-03-28 MED ORDER — GERHARDT'S BUTT CREAM
TOPICAL_CREAM | Freq: Three times a day (TID) | CUTANEOUS | Status: DC
  Filled 2019-03-28: qty 1

## 2019-03-28 MED ORDER — CHLORHEXIDINE GLUCONATE CLOTH 2 % EX PADS
6.0000 | MEDICATED_PAD | Freq: Every day | CUTANEOUS | Status: DC
  Administered 2019-03-30: 6 via TOPICAL

## 2019-03-28 MED ORDER — DIPHENOXYLATE-ATROPINE 2.5-0.025 MG/5ML PO LIQD
5.0000 mL | Freq: Four times a day (QID) | ORAL | Status: DC | PRN
  Administered 2019-03-28: 18:00:00 5 mL via ORAL
  Filled 2019-03-28: qty 5

## 2019-03-28 NOTE — Evaluation (Signed)
Physical Therapy Evaluation Patient Details Name: Christina [name] MRN: [id number] DOB: 02-18-1950 Today's Date: 03/28/2019   History of Present Illness  69 year old woman with ESRD on dialysis, atrial fibrillation without anticoagulation, COPD, multiple admits for abdominal pain, discharged on 2/23 where acalculus cholecystitis was being considered and peptic ulcer disease given duodenal thickening.    Clinical Impression  Pt admitted with above. Pt lives alone, uses cane and doesn't drive. Sisters takes pt to grocery store. Pt functioning a supervision/min guard level. At this time pt to benefit from HHPT to progress indep, safety with mobility, and indep. How with pt's abdominal pain becoming under control pt may progress and not need HHPT. Acute PT to con to follow and assess.    Follow Up Recommendations Home health PT;Supervision - Intermittent(may progress and not need it)    Equipment Recommendations  None recommended by PT    Recommendations for Other Services       Precautions / Restrictions Precautions Precautions: Fall Restrictions Weight Bearing Restrictions: No      Mobility  Bed Mobility Overal bed mobility: Independent                Transfers Overall transfer level: Needs assistance   Transfers: Sit to/from Stand Sit to Stand: Supervision         General transfer comment: supervision for safety, pt impulsively quick  Ambulation/Gait Ambulation/Gait assistance: Min guard Gait Distance (Feet): 300 Feet Assistive device: None Gait Pattern/deviations: Step-through pattern;Decreased stride length Gait velocity: slow Gait velocity interpretation: 1.31 - 2.62 ft/sec, indicative of limited community ambulator General Gait Details: no episode of LOB, pt reports typically using a cane, provided min guard assist for safety, verbal cues to find room  Stairs            Wheelchair Mobility    Modified Rankin (Stroke Patients Only)        Balance                                 Standardized Balance Assessment Standardized Balance Assessment : Dynamic Gait Index   Dynamic Gait Index Level Surface: Mild Impairment Change in Gait Speed: Mild Impairment Gait with Horizontal Head Turns: Mild Impairment Gait with Vertical Head Turns: Mild Impairment Gait and Pivot Turn: Mild Impairment Step Over Obstacle: Mild Impairment Step Around Obstacles: Mild Impairment Steps: Mild Impairment Total Score: 16       Pertinent Vitals/Pain Pain Assessment: 0-10 Pain Score: 4  Pain Location: abdomen Pain Descriptors / Indicators: Sore Pain Intervention(s): Monitored during session    Home Living Family/patient expects to be discharged to:: Private residence Living Arrangements: Alone Available Help at Discharge: Family;Available PRN/intermittently(sisters) Type of Home: Apartment Home Access: Level entry     Home Layout: One level Home Equipment: Walker - 2 wheels;Cane - single point      Prior Function Level of Independence: Needs assistance   Gait / Transfers Assistance Needed: uses straight cane, takes bus to HD  ADL's / Homemaking Assistance Needed: sisters assist with grocery shopping        Hand Dominance   Dominant Hand: Right    Extremity/Trunk Assessment   Upper Extremity Assessment Upper Extremity Assessment: Generalized weakness    Lower Extremity Assessment Lower Extremity Assessment: Generalized weakness    Cervical / Trunk Assessment Cervical / Trunk Assessment: Normal  Communication   Communication: No difficulties  Cognition Arousal/Alertness: Awake/alert Behavior During Therapy: WFL for tasks assessed/performed  Overall Cognitive Status: Within Functional Limits for tasks assessed                                 General Comments: pt with delayed response time when following multi-step commands, suspect this to be baseline      General Comments General  comments (skin integrity, edema, etc.): VSS    Exercises     Assessment/Plan    PT Assessment Patient needs continued PT services  PT Problem List Decreased strength;Decreased activity tolerance;Decreased balance;Decreased mobility       PT Treatment Interventions DME instruction;Gait training;Stair training;Functional mobility training;Therapeutic activities;Therapeutic exercise;Balance training    PT Goals (Current goals can be found in the Care Plan section)  Acute Rehab PT Goals PT Goal Formulation: With patient Time For Goal Achievement: 04/11/19 Potential to Achieve Goals: Good Additional Goals Additional Goal #1: Pt to score >19 on DGI to indicate minimal falls risk.    Frequency Min 3X/week   Barriers to discharge Decreased caregiver support lives alone    Co-evaluation               AM-PAC PT "6 Clicks" Mobility  Outcome Measure Help needed turning from your back to your side while in a flat bed without using bedrails?: None Help needed moving from lying on your back to sitting on the side of a flat bed without using bedrails?: None Help needed moving to and from a bed to a chair (including a wheelchair)?: A Little Help needed standing up from a chair using your arms (e.g., wheelchair or bedside chair)?: A Little Help needed to walk in hospital room?: A Little Help needed climbing 3-5 steps with a railing? : A Little 6 Click Score: 20    End of Session Equipment Utilized During Treatment: Gait belt Activity Tolerance: Patient tolerated treatment well Patient left: in bed;with call bell/phone within reach;with bed alarm set Nurse Communication: Mobility status PT Visit Diagnosis: Unsteadiness on feet (R26.81);Muscle weakness (generalized) (M62.81);Difficulty in walking, not elsewhere classified (R26.2)    Time: 0263-7858 PT Time Calculation (min) (ACUTE ONLY): 21 min   Charges:   PT Evaluation $PT Eval Moderate Complexity: 1 Mod          Kittie Plater, PT, DPT Acute Rehabilitation Services Pager #: [phone] Office #: [phone]   Berline Lopes 03/28/2019, 12:38 PM

## 2019-03-28 NOTE — Progress Notes (Signed)
[facility] NEPHROLOGY PROGRESS NOTE  Assessment/ Plan: Pt is a 69 y.o. yo female with ESRD on HD TTS, HTN, A. fib, CAD, now with GI bleed and ischemic colitis.  HD orders: [facility] T,Th,S 4 hrs 180NRe 250/Autoflow 1.5 44 kg 2.0 K/2.0 Ca UFP 2 -No heparin -Venofer 50 mg IV weekly (last dose 100 mg IV 03/22/19) -Calcitriol 1.0 mcg PO TIW  #Acute blood loss anemia/GI bleed: Status post PRBC transfusion.  EGD with multiple duodenal ulcers, large for endoscopy intervention may need IR.  GI is following.  Monitor hemoglobin. Start Aranesp.  # ESRD TTS: Plan for next dialysis tomorrow.  Right IJ TDC for the access.  # Secondary hyperparathyroidism: Monitor phosphorus level.  Continue calcitriol and Auryxia.  # HTN/volume: Blood pressure is variable.  Continue to monitor.  UF during HD.  Subjective: Patient was seen and examined at bedside.  Denied nausea vomiting chest pain shortness of breath.  Denies further bleeding.  No new event. Objective Vital signs in last 24 hours: Vitals:   03/27/19 2320 03/28/19 0217 03/28/19 0712 03/28/19 0808  BP: (!) 145/54 (!) 147/59 (!) 160/61   Pulse: 65 64 65   Resp: 15 16 14    Temp: 97.8 F (36.6 C) 97.6 F (36.4 C) 97.9 F (36.6 C)   TempSrc: Oral Oral Oral   SpO2: 92% 92% 97% 96%  Weight:  45.6 kg    Height:       Weight change: -3.388 kg  Intake/Output Summary (Last 24 hours) at 03/28/2019 0816 Last data filed at 03/28/2019 0740 Gross per 24 hour  Intake 960 ml  Output 1251 ml  Net -291 ml       Labs: Basic Metabolic Panel: Recent Labs  Lab 03/21/19 1029 03/22/19 0802 03/25/19 0611 03/25/19 1313 03/26/19 0236 03/26/19 0236 03/26/19 1158 03/27/19 0221 03/28/19 0128  NA 138   < >   < >  --  140   < > 134* 131* 129*  K 3.2*   < >   < >  --  4.5   < > 3.5 4.0 3.7  CL 98   < >   < >  --  106   < > 94* 98 96*  CO2 23   < >   < >  --  19*  --   --  26 21*  GLUCOSE 189*   < >   < >  --  102*   < > 89 91 89  BUN 29*    < >   < >  --  75*   < > 11 15 19   CREATININE 3.80*   < >   < >  --  5.93*   < > 1.60* 2.81* 3.89*  CALCIUM 7.3*   < >   < >  --  8.4*  --   --  7.7* 7.9*  PHOS 6.2*  --   --  6.2* 6.5*  --   --   --   --    < > = values in this interval not displayed.   Liver Function Tests: Recent Labs  Lab 03/21/19 1029 03/25/19 0611 03/26/19 0236  AST  --  30 33  ALT  --  20 25  ALKPHOS  --  53 49  BILITOT  --  0.5 0.8  PROT  --  4.7* 4.4*  ALBUMIN 2.5* 2.4* 2.2*   Recent Labs  Lab 03/25/19 0611 03/25/19 1313 03/26/19 0236  LIPASE 64*  --  64*  AMYLASE  --  97  --    No results for input(s): AMMONIA in the last 168 hours. CBC: Recent Labs  Lab 03/25/19 0611 03/25/19 0611 03/25/19 1616 03/25/19 1616 03/25/19 2007 03/25/19 2007 03/26/19 0236 03/26/19 0236 03/26/19 1158 03/27/19 0221 03/28/19 0128  WBC 10.8*   < > 14.7*   < > 14.7*   < > 11.8*  --   --  9.7 7.3  NEUTROABS 8.1*  --   --   --   --   --   --   --   --  7.3 5.0  HGB 5.1*   < > 10.0*   < > 9.0*   < > 8.3*   < > 9.5* 7.8* 8.9*  HCT 16.8*   < > 30.1*   < > 26.9*   < > 25.3*   < > 28.0* 24.6* 28.1*  MCV 100.0   < > 92.0  --  90.6  --  92.3  --   --  95.3 97.6  PLT 218   < > 184   < > 189   < > 184  --   --  150 154   < > = values in this interval not displayed.   Cardiac Enzymes: No results for input(s): CKTOTAL, CKMB, CKMBINDEX, TROPONINI in the last 168 hours. CBG: No results for input(s): GLUCAP in the last 168 hours.  Iron Studies: No results for input(s): IRON, TIBC, TRANSFERRIN, FERRITIN in the last 72 hours. Studies/Results: No results found.  Medications: Infusions: . sodium chloride 10 mL/hr at 03/25/19 1607    Scheduled Medications: . allopurinol  100 mg Oral Daily  . arformoterol  15 mcg Nebulization BID  . budesonide (PULMICORT) nebulizer solution  0.5 mg Nebulization BID  . Chlorhexidine Gluconate Cloth  6 each Topical Q0600  . cholecalciferol  2,000 Units Oral Daily  . dicyclomine  20 mg  Oral QHS  . doxercalciferol  2 mcg Intravenous Q T,Th,Sa-HD  . ferric citrate  630 mg Oral TID WC  . fluticasone furoate-vilanterol  1 puff Inhalation Daily  . gabapentin  300 mg Oral QHS  . ipratropium-albuterol  3 mL Nebulization BID  . metoprolol tartrate  100 mg Oral BID  . pantoprazole (PROTONIX) IV  40 mg Intravenous Q12H  . pravastatin  80 mg Oral QPM  . sucralfate  1 g Oral Q6H  . traMADol  50 mg Oral Once    have reviewed scheduled and prn medications.  Physical Exam: General:NAD, comfortable Heart:RRR, s1s2 nl Lungs:clear b/l, no crackle Abdomen:soft, Non-tender, non-distended Extremities:No edema Dialysis Access: Right IJ TDC.  Victoria Euceda Tanna Furry 03/28/2019,8:16 AM  LOS: 3 days  Pager: [phone]

## 2019-03-28 NOTE — Progress Notes (Addendum)
PROGRESS NOTE    Christina [name]  IEP:[id number] DOB: Jan 05, 1951 DOA: 03/25/2019 PCP: Maggie Schwalbe, PA-C    Brief Narrative:  Patient was admitted to the hospital with the working diagnosis of ischemic colitis, complicated with severe acute blood loss anemia due to upper gi bleed.  69 year old female with a past medical history for end-stage renal disease on hemodialysis(Tuesday, Thursday, Saturday),hypertension, atrial fibrillation, coronary disease, COPD and history of small bowel obstructions. Patient presented with abdominal pain and chest pain, associated with diarrhea and weakness. Apparently she missed her last hemodialysis session on 02/25.Her initial physical examination her blood pressure was 135/73, heart rate 124, temperature 98, respiratory 22, oxygen saturation 100%. She had coarse breath sounds bilaterally, diminished at bases, no wheezing, heart S1-S2 present, irregularly irregular, abdomen tender to palpation, distended, no rebound, positive bowel sounds, no lower extremity edema. Sodium 136, potassium 4.0, chloride 98, bicarb 16, glucose 174, BUN 80, creatinine 5.6,troponin I 55, white count 10.8, hemoglobin 5.1, hematocrit 16.8, platelets 218.SARS COVID-19 negative. Urine analysis negative for infection. Drug screen positive for cocaine. 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 EKG 125 bpm, normal axis, normal QRS, atrial fibrillation rhythm, no ST segment changes, inferior lateral T wave inversions.  Received 2 units packed red blood cells, with good toleration. 02/27 patient underwent hemodialysis and upper endoscopy. Surgery recommended conservative medical therapy.  Upper endoscopy 02/27, found a large duodenal ulcer,  patient placed on IV proton pump inhibitor and sucralfate. Atrial fibrillation with RVR converted to sinus rhythm with AV blockade  Hgb and Hct have remain stable.   Assessment & Plan:   Principal Problem:   Ischemic bowel disease ([place]) Active Problems:   ESRD (end stage renal disease) ([place])   Bipolar affective ([place])   CAD (coronary artery disease), native coronary artery   COPD with chronic bronchitis (HCC)   CVA (cerebral vascular accident) ([place])   Essential hypertension   Hyperlipidemia   Anemia due to chronic blood loss   Afib (HCC)   AF (paroxysmal atrial fibrillation) ([place])   Duodenal ulcer   Gastritis and gastroduodenitis   Acute blood loss anemia   Atrial fibrillation with RVR ([place])    1. Acute blood loss anemia due to upper GI bleed, ischemic colitis and duodenal ulcers. sp two unit PRBC transfusion.  Upper endoscopy with positive gastritis and duodenal ulcer (to large for endoscopic procedure).   Hgb up to 8,9 with Hct at 28,1, no abdominal pain and patient tolerating po well clear liquid diet.   On antiacid therapy, pantoprazole IV bid and sucralfate qid. Will advance diet to full liquids and will follow on cell count in am.  Continue with tramadol and bentyl for abdominal pain.   2. Atrial fibrillation with RVR. Tolerating well metoprolol 100 mg bid. She is not a good candidate for anticoagulation due to gi bleeding.   Continue telemetry monitoring for now. Out of bed to chair tid with meals, physical and occupational therapy evaluation.   3. ESRD on HD/ hyponatremia/ anemia of chronic renal disease with iron deficiency. HD access tunneled catheter right IJ, clinically euvolemic, will follow with nephrology for next HD. Today patient clinically euvolemic. Na down to 129   On oral iron and doxercalciferol. Next HD in am, follow with nephrology recommendations.   4. COPD. No current signs of clinical signs  of acute exacerbation, continue with as needed  bronchodilators/ inhaled steroids.   5. HTN. Blood pressure 147 to 588 mmHg systolic will continue to hold on amlodipine and isosorbide.   6. Gout. No acute flare, Continue with allopurinol.   7. Dyslipidemia. Continue with pravastatin.  DVT prophylaxis:scd Code Status:full Family Communication:no family at the bedside Disposition Plan/ discharge barriers:patient continue acutely ill   Consultants:   Surgery   GI   Nephrology   Procedures:   EGD      Subjective: Patient has urinary retention last night, improved this am. No melena or hematemesis, no nausea or vomiting, no chest pain or dyspnea.   Objective: Vitals:   03/27/19 2320 03/28/19 0217 03/28/19 0712 03/28/19 0808  BP: (!) 145/54 (!) 147/59 (!) 160/61   Pulse: 65 64 65   Resp: 15 16 14    Temp: 97.8 F (36.6 C) 97.6 F (36.4 C) 97.9 F (36.6 C)   TempSrc: Oral Oral Oral   SpO2: 92% 92% 97% 96%  Weight:  45.6 kg    Height:        Intake/Output Summary (Last 24 hours) at 03/28/2019 0821 Last data filed at 03/28/2019 0740 Gross per 24 hour  Intake 720 ml  Output 1251 ml  Net -531 ml   Filed Weights   03/26/19 1202 03/27/19 0308 03/28/19 0217  Weight: 49 kg 44.5 kg 45.6 kg    Examination:   General: Not in pain or dyspnea, deconditioned  Neurology: Awake and alert, non focal  E ENT: mild pallor, no icterus, oral mucosa moist Cardiovascular: No JVD. S1-S2 present, rhythmic, no gallops, rubs, or murmurs. No lower extremity edema. Pulmonary: vesicular breath sounds bilaterally, adequate air movement, no wheezing, rhonchi or rales. Gastrointestinal. Abdomen with no organomegaly, non tender, no rebound or guarding Skin. No rashes Musculoskeletal: no joint deformities HD access right IJ tunneled catheter.     Data Reviewed: I have personally reviewed following labs and imaging studies  CBC: Recent Labs  Lab 03/21/19 1029 03/21/19 1029 03/22/19 0802 03/22/19 0802 03/25/19 5027  03/25/19 0611 03/25/19 1616 03/25/19 1616 03/25/19 2007 03/26/19 0236 03/26/19 1158 03/27/19 0221 03/28/19 0128  WBC 9.1   < > 11.3*   < > 10.8*   < > 14.7*  --  14.7* 11.8*  --  9.7 7.3  NEUTROABS 7.5  --  9.6*  --  8.1*  --   --   --   --   --   --  7.3 5.0  HGB 7.7*   < > 7.8*   < > 5.1*   < > 10.0*   < > 9.0* 8.3* 9.5* 7.8* 8.9*  HCT 25.2*   < > 25.7*   < > 16.8*   < > 30.1*   < > 26.9* 25.3* 28.0* 24.6* 28.1*  MCV 97.3   < > 100.4*   < > 100.0   < > 92.0  --  90.6 92.3  --  95.3 97.6  PLT 224   < > 211   < > 218   < > 184  --  189 184  --  150 154   < > = values in this interval not displayed.   Basic Metabolic Panel: Recent Labs  Lab 03/21/19 1029 03/21/19 1029 03/22/19 0802 03/22/19 0802 03/25/19 0611 03/25/19 1313 03/26/19 0236 03/26/19 1158 03/27/19 0221 03/28/19 0128  NA 138   < > 139   < > 136  --  140 134* 131* 129*  K  3.2*   < > 3.3*   < > 4.0  --  4.5 3.5 4.0 3.7  CL 98   < > 100   < > 98  --  106 94* 98 96*  CO2 23   < > 22  --  16*  --  19*  --  26 21*  GLUCOSE 189*   < > 230*   < > 174*  --  102* 89 91 89  BUN 29*   < > 40*   < > 80*  --  75* 11 15 19   CREATININE 3.80*   < > 5.13*   < > 5.62*  --  5.93* 1.60* 2.81* 3.89*  CALCIUM 7.3*   < > 7.4*  --  8.3*  --  8.4*  --  7.7* 7.9*  MG  --   --  1.4*  --   --  1.9 1.9  --   --   --   PHOS 6.2*  --   --   --   --  6.2* 6.5*  --   --   --    < > = values in this interval not displayed.   GFR: Estimated Creatinine Clearance: 10 mL/min (A) (by C-G formula based on SCr of 3.89 mg/dL (H)). Liver Function Tests: Recent Labs  Lab 03/21/19 1029 03/25/19 0611 03/26/19 0236  AST  --  30 33  ALT  --  20 25  ALKPHOS  --  53 49  BILITOT  --  0.5 0.8  PROT  --  4.7* 4.4*  ALBUMIN 2.5* 2.4* 2.2*   Recent Labs  Lab 03/25/19 0611 03/25/19 1313 03/26/19 0236  LIPASE 64*  --  64*  AMYLASE  --  97  --    No results for input(s): AMMONIA in the last 168 hours. Coagulation Profile: Recent Labs  Lab  03/25/19 1313  INR 1.1   Cardiac Enzymes: No results for input(s): CKTOTAL, CKMB, CKMBINDEX, TROPONINI in the last 168 hours. BNP (last 3 results) No results for input(s): PROBNP in the last 8760 hours. HbA1C: No results for input(s): HGBA1C in the last 72 hours. CBG: No results for input(s): GLUCAP in the last 168 hours. Lipid Profile: No results for input(s): CHOL, HDL, LDLCALC, TRIG, CHOLHDL, LDLDIRECT in the last 72 hours. Thyroid Function Tests: No results for input(s): TSH, T4TOTAL, FREET4, T3FREE, THYROIDAB in the last 72 hours. Anemia Panel: No results for input(s): VITAMINB12, FOLATE, FERRITIN, TIBC, IRON, RETICCTPCT in the last 72 hours.    Radiology Studies: I have reviewed all of the imaging during this hospital visit personally     Scheduled Meds: . allopurinol  100 mg Oral Daily  . arformoterol  15 mcg Nebulization BID  . budesonide (PULMICORT) nebulizer solution  0.5 mg Nebulization BID  . Chlorhexidine Gluconate Cloth  6 each Topical Q0600  . cholecalciferol  2,000 Units Oral Daily  . dicyclomine  20 mg Oral QHS  . doxercalciferol  2 mcg Intravenous Q T,Th,Sa-HD  . ferric citrate  630 mg Oral TID WC  . fluticasone furoate-vilanterol  1 puff Inhalation Daily  . gabapentin  300 mg Oral QHS  . ipratropium-albuterol  3 mL Nebulization BID  . metoprolol tartrate  100 mg Oral BID  . pantoprazole (PROTONIX) IV  40 mg Intravenous Q12H  . pravastatin  80 mg Oral QPM  . sucralfate  1 g Oral Q6H  . traMADol  50 mg Oral Once   Continuous Infusions: . sodium chloride  10 mL/hr at 03/25/19 1607     LOS: 3 days        Refugia Laneve Gerome Apley, MD

## 2019-03-28 NOTE — Progress Notes (Signed)
No clinical evidence of acute SMA/IMA occlusion. Transfused 2u on 2/26, none since. Hgb 8.9 from 7.8 yesterday. Recommend continued monitoring and await biopsy results. Continue carafate and BID PPI therapy. Re-scope at discretion of GI. No acute surgical needs at this time.   Jesusita Oka, MD General and [facility]

## 2019-03-28 NOTE — Progress Notes (Signed)
Pt foley removed on day shift and pt has only urinated 50 mls since 1245pm 03/27/2019. Pt has urge to urinate so ba bladder scan was performed and showed 495 mls in pts bladder. [facility] MD on call paged. Awaiting new orders.

## 2019-03-29 DIAGNOSIS — K253 Acute gastric ulcer without hemorrhage or perforation: Secondary | ICD-10-CM

## 2019-03-29 LAB — CBC WITH DIFFERENTIAL/PLATELET
Abs Immature Granulocytes: 0.1 10*3/uL — ABNORMAL HIGH (ref 0.00–0.07)
Basophils Absolute: 0 10*3/uL (ref 0.0–0.1)
Basophils Relative: 0 %
Eosinophils Absolute: 0.3 10*3/uL (ref 0.0–0.5)
Eosinophils Relative: 2 %
HCT: 28.4 % — ABNORMAL LOW (ref 36.0–46.0)
Hemoglobin: 9.1 g/dL — ABNORMAL LOW (ref 12.0–15.0)
Immature Granulocytes: 1 %
Lymphocytes Relative: 9 %
Lymphs Abs: 1 10*3/uL (ref 0.7–4.0)
MCH: 31 pg (ref 26.0–34.0)
MCHC: 32 g/dL (ref 30.0–36.0)
MCV: 96.6 fL (ref 80.0–100.0)
Monocytes Absolute: 0.8 10*3/uL (ref 0.1–1.0)
Monocytes Relative: 7 %
Neutro Abs: 8.9 10*3/uL — ABNORMAL HIGH (ref 1.7–7.7)
Neutrophils Relative %: 81 %
Platelets: 196 10*3/uL (ref 150–400)
RBC: 2.94 MIL/uL — ABNORMAL LOW (ref 3.87–5.11)
RDW: 16.2 % — ABNORMAL HIGH (ref 11.5–15.5)
WBC: 11.1 10*3/uL — ABNORMAL HIGH (ref 4.0–10.5)
nRBC: 0.2 % (ref 0.0–0.2)

## 2019-03-29 LAB — SURGICAL PATHOLOGY

## 2019-03-29 LAB — BASIC METABOLIC PANEL
Anion gap: 11 (ref 5–15)
BUN: 24 mg/dL — ABNORMAL HIGH (ref 8–23)
CO2: 22 mmol/L (ref 22–32)
Calcium: 7.8 mg/dL — ABNORMAL LOW (ref 8.9–10.3)
Chloride: 93 mmol/L — ABNORMAL LOW (ref 98–111)
Creatinine, Ser: 4.63 mg/dL — ABNORMAL HIGH (ref 0.44–1.00)
GFR calc Af Amer: 11 mL/min — ABNORMAL LOW (ref >60)
GFR calc non Af Amer: 9 mL/min — ABNORMAL LOW (ref >60)
Glucose, Bld: 82 mg/dL (ref 70–99)
Potassium: 3.9 mmol/L (ref 3.5–5.1)
Sodium: 126 mmol/L — ABNORMAL LOW (ref 135–145)

## 2019-03-29 MED ORDER — LIDOCAINE HCL (PF) 1 % IJ SOLN: 5.0000 mL | INTRAMUSCULAR | Status: DC | PRN

## 2019-03-29 MED ORDER — ALTEPLASE 2 MG IJ SOLR
2.0000 mg | Freq: Once | INTRAMUSCULAR | Status: DC | PRN
  Filled 2019-03-29: qty 2

## 2019-03-29 MED ORDER — SODIUM CHLORIDE 0.9 % IV SOLN: 100.0000 mL | INTRAVENOUS | Status: DC | PRN

## 2019-03-29 MED ORDER — LIDOCAINE-PRILOCAINE 2.5-2.5 % EX CREA
1.0000 "application " | TOPICAL_CREAM | CUTANEOUS | Status: DC | PRN
  Filled 2019-03-29: qty 5

## 2019-03-29 MED ORDER — LORAZEPAM 2 MG/ML IJ SOLN: 0.5000 mg | Freq: Once | INTRAMUSCULAR | Status: AC

## 2019-03-29 MED ORDER — HEPARIN SODIUM (PORCINE) 1000 UNIT/ML IJ SOLN
INTRAMUSCULAR | Status: AC
  Administered 2019-03-29: 4000 [IU]
  Filled 2019-03-29: qty 4

## 2019-03-29 MED ORDER — HEPARIN SODIUM (PORCINE) 1000 UNIT/ML DIALYSIS
1000.0000 [IU] | INTRAMUSCULAR | Status: DC | PRN
  Filled 2019-03-29: qty 1

## 2019-03-29 MED ORDER — LORAZEPAM 2 MG/ML IJ SOLN
1.0000 mg | Freq: Once | INTRAMUSCULAR | Status: AC
  Administered 2019-03-29: 1 mg via INTRAVENOUS
  Filled 2019-03-29: qty 1

## 2019-03-29 MED ORDER — LORAZEPAM 2 MG/ML IJ SOLN
INTRAMUSCULAR | Status: AC
  Administered 2019-03-29: 0.5 mg via INTRAVENOUS
  Filled 2019-03-29: qty 1

## 2019-03-29 MED ORDER — DARBEPOETIN ALFA 100 MCG/0.5ML IJ SOSY
PREFILLED_SYRINGE | INTRAMUSCULAR | Status: AC
  Administered 2019-03-29: 100 ug
  Filled 2019-03-29: qty 0.5

## 2019-03-29 MED ORDER — DOXERCALCIFEROL 4 MCG/2ML IV SOLN
INTRAVENOUS | Status: AC
  Filled 2019-03-29: qty 2

## 2019-03-29 MED ORDER — PENTAFLUOROPROP-TETRAFLUOROETH EX AERO
1.0000 "application " | INHALATION_SPRAY | CUTANEOUS | Status: DC | PRN
  Filled 2019-03-29: qty 116

## 2019-03-29 NOTE — Evaluation (Signed)
Occupational Therapy Evaluation Patient Details Name: Christina [name] MRN: [id number] DOB: August 10, 1950 Today's Date: 03/29/2019    History of Present Illness 69 year old woman with ESRD on dialysis, atrial fibrillation without anticoagulation, COPD, multiple admits for abdominal pain, discharged on 2/23 where acalculus cholecystitis was being considered and peptic ulcer disease given duodenal thickening.   Clinical Impression   Pt PTA: living alone with sister involved in care. Pt currently following commands, but not lucid to situation. Pt requiring multimodal cues to complete any task with multiple verbal cues. Pt requiring supervisionA for safety, but pt donning socks at EOB; performing toilet hygiene in standing and grooming at sink. Pt with cognitive deficits today and requiring cueing to complete tasks. No physical assist required. Pt would benefit from continued OT skilled services for ADL. OT following acutely.    Follow Up Recommendations  Home health OT;Supervision - Intermittent    Equipment Recommendations  None recommended by OT    Recommendations for Other Services       Precautions / Restrictions Precautions Precautions: Fall Restrictions Weight Bearing Restrictions: No      Mobility Bed Mobility Overal bed mobility: Independent                Transfers Overall transfer level: Needs assistance   Transfers: Sit to/from Stand Sit to Stand: Supervision         General transfer comment: Pt impulsive    Balance Overall balance assessment: No apparent balance deficits (not formally assessed)                                         ADL either performed or assessed with clinical judgement   ADL Overall ADL's : At baseline                                       General ADL Comments: Pt requiring supervisionA for safety, but pt donning socks at EOB; performing toilet hygiene in standing and grooming at sink. pt with  cognitive deficits today and requiring cueing to complete tasks. No physical assist required.     Vision Baseline Vision/History: No visual deficits Vision Assessment?: No apparent visual deficits     Perception     Praxis      Pertinent Vitals/Pain Pain Assessment: Faces Faces Pain Scale: No hurt Pain Intervention(s): Monitored during session     Hand Dominance Right   Extremity/Trunk Assessment Upper Extremity Assessment Upper Extremity Assessment: Generalized weakness   Lower Extremity Assessment Lower Extremity Assessment: Overall WFL for tasks assessed;Generalized weakness   Cervical / Trunk Assessment Cervical / Trunk Assessment: Normal   Communication Communication Communication: Expressive difficulties;Other (comment)(speech unintelligible at times)   Cognition Arousal/Alertness: Awake/alert Behavior During Therapy: Impulsive Overall Cognitive Status: Impaired/Different from baseline Area of Impairment: Orientation;Attention;Following commands;Safety/judgement                 Orientation Level: Disoriented to;Situation Current Attention Level: Sustained   Following Commands: Follows one step commands with increased time;Follows multi-step commands inconsistently Safety/Judgement: Decreased awareness of safety;Decreased awareness of deficits     General Comments: Pt required ativan while at HD as pt was irritable and acting out. During this OT session, pt following commands, but not lucid to situation. Pt requiring multimodal cues to complete any task. Multiple verbal cues to complete tasks.  General Comments  VSS    Exercises     Shoulder Instructions      Home Living Family/patient expects to be discharged to:: Private residence Living Arrangements: Alone Available Help at Discharge: Family;Available PRN/intermittently(sisters) Type of Home: Apartment Home Access: Level entry     Home Layout: One level     Bathroom Shower/Tub:  Teacher, early years/pre: Handicapped height     Home Equipment: Environmental consultant - 2 wheels;Cane - single point   Additional Comments: info obtained from PT eval as pt with AMS today      Prior Functioning/Environment Level of Independence: Needs assistance  Gait / Transfers Assistance Needed: uses straight cane, takes bus to HD ADL's / Homemaking Assistance Needed: sisters assist with grocery shopping            OT Problem List: Decreased strength;Decreased activity tolerance;Impaired balance (sitting and/or standing);Decreased safety awareness;Pain;Decreased cognition      OT Treatment/Interventions: Self-care/ADL training;Therapeutic exercise;Energy conservation;DME and/or AE instruction;Therapeutic activities;Patient/family education;Balance training    OT Goals(Current goals can be found in the care plan section) Acute Rehab OT Goals Patient Stated Goal: to go home OT Goal Formulation: Patient unable to participate in goal setting Time For Goal Achievement: 04/12/19 Potential to Achieve Goals: Good ADL Goals Pt Will Perform Grooming: with modified independence;standing Pt/caregiver will Perform Home Exercise Program: Increased strength;Both right and left upper extremity;With Supervision Additional ADL Goal #1: Pt will follow multi-step commands with minimal verbal cues for progression of ADL tasks Additional ADL Goal #2: Pt will increase to modified independence with OOB ADL.  OT Frequency: Min 2X/week   Barriers to D/C:            Co-evaluation              AM-PAC OT "6 Clicks" Daily Activity     Outcome Measure Help from another person eating meals?: A Little Help from another person taking care of personal grooming?: A Little Help from another person toileting, which includes using toliet, bedpan, or urinal?: A Little Help from another person bathing (including washing, rinsing, drying)?: A Little Help from another person to put on and taking off  regular upper body clothing?: A Little Help from another person to put on and taking off regular lower body clothing?: A Little 6 Click Score: 18   End of Session Equipment Utilized During Treatment: Gait belt;Rolling walker Nurse Communication: Mobility status  Activity Tolerance: Patient tolerated treatment well;Treatment limited secondary to medical complications (Comment) Patient left: in bed;with call bell/phone within reach;with bed alarm set  OT Visit Diagnosis: Unsteadiness on feet (R26.81);Muscle weakness (generalized) (M62.81);Other symptoms and signs involving cognitive function                Time: 9892-1194 OT Time Calculation (min): 17 min Charges:  OT General Charges $OT Visit: 1 Visit OT Evaluation $OT Eval Moderate Complexity: 1 Mod  Jefferey Pica, OTR/L Acute Rehabilitation Services Pager: [phone] Office: [phone]   OVZCHYI C 03/29/2019, 3:27 PM

## 2019-03-29 NOTE — Plan of Care (Signed)

## 2019-03-29 NOTE — Progress Notes (Addendum)
Patient well known to Renal Navigator. Renal Navigator noticed patient trying to get up out of HD bed when Navigator entered unit. Navigator went to bedside to provide support and comfort to patient. She states she does not recall who I am, though Navigator is quite certain that if patient was thinking clearly, she would remember Navigator-we have met multiple times over the last few weeks. Patient was highly agitated and kept asking for "help," and "get my shoes." She said she was hungry and thirsty and then would lie back down. She tried to get up from the bed numerous times, but Navigator was able to redirect her with gentle talking and soft humming. Patient then asked for hugs. At times she allowed Navigator to hold her hand for comfort and then she wouldn't. Patient medicated by RN per MD order to help with agitation and allow for HD treatment to be completed. She fell asleep and Navigator left bedside.    Alphonzo Cruise, [place]  Renal Navigator  [phone]

## 2019-03-29 NOTE — Plan of Care (Signed)

## 2019-03-29 NOTE — Progress Notes (Signed)
Christina [name]  Assessment/ Plan: Pt is a 68 y.o. yo female with ESRD on HD TTS, HTN, A. fib, CAD, now with GI bleed and ischemic colitis.  HD orders: [place] T,Th,S 4 hrs 180NRe 250/Autoflow 1.5 44 kg 2.0 K/2.0 Ca UFP 2 -No heparin -Venofer 50 mg IV weekly (last dose 100 mg IV 03/22/19) -Calcitriol 1.0 mcg PO TIW  #Acute blood loss anemia/GI bleed: Status post PRBC transfusion.  EGD with multiple duodenal ulcers, large for endoscopy intervention.  GI is following.  Monitor hemoglobin. Started Aranesp.  # ESRD TTS: HD today, tolerating well.  Requiring Ativan for agitation and pulling lines.  Right IJ TDC for the access.  # Secondary hyperparathyroidism: Phosphorus at goal.  Continue calcitriol and Auryxia.  # HTN/volume: Blood pressure is variable.  Continue to monitor.  UF during HD.  #Hyponatremia expect to improve with dialysis.  Minimize fluid intake.  Subjective: Patient was seen and examined at dialysis unit.  She was having intermittent agitation.  Review of system unable to obtain.  Tolerating HD well.  Objective Vital signs in last 24 hours: Vitals:   03/29/19 0700 03/29/19 0705 03/29/19 0730 03/29/19 0800  BP: (!) 147/63 (!) 165/62 (!) 153/58 (!) 156/58  Pulse: 66 66 66 68  Resp: 12 12 17  (!) 22  Temp:      TempSrc:      SpO2:      Weight:      Height:       Weight change: 2.3 kg  Intake/Output Summary (Last 24 hours) at 03/29/2019 0850 Last data filed at 03/28/2019 1741 Gross per 24 hour  Intake 480 ml  Output 450 ml  Net 30 ml       Labs: Basic Metabolic Panel: Recent Labs  Lab 03/25/19 0611 03/25/19 1313 03/26/19 0236 03/26/19 1158 03/27/19 0221 03/28/19 0128 03/29/19 0138  NA   < >  --  140   < > 131* 129* 126*  K   < >  --  4.5   < > 4.0 3.7 3.9  CL   < >  --  106   < > 98 96* 93*  CO2   < >  --  19*  --  26 21* 22  GLUCOSE   < >  --  102*   < > 91 89 82  BUN   < >  --  75*   < > 15 19 24*  CREATININE   <  >  --  5.93*   < > 2.81* 3.89* 4.63*  CALCIUM   < >  --  8.4*  --  7.7* 7.9* 7.8*  PHOS  --  6.2* 6.5*  --   --  3.5  --    < > = values in this interval not displayed.   Liver Function Tests: Recent Labs  Lab 03/25/19 0611 03/26/19 0236  AST 30 33  ALT 20 25  ALKPHOS 53 49  BILITOT 0.5 0.8  PROT 4.7* 4.4*  ALBUMIN 2.4* 2.2*   Recent Labs  Lab 03/25/19 0611 03/25/19 1313 03/26/19 0236  LIPASE 64*  --  64*  AMYLASE  --  97  --    No results for input(s): AMMONIA in the last 168 hours. CBC: Recent Labs  Lab 03/25/19 2007 03/25/19 2007 03/26/19 0236 03/26/19 1158 03/27/19 0221 03/28/19 0128 03/29/19 0138  WBC 14.7*   < > 11.8*  --  9.7 7.3 11.1*  NEUTROABS  --   --   --   --  7.3 5.0 8.9*  HGB 9.0*   < > 8.3*   < > 7.8* 8.9* 9.1*  HCT 26.9*   < > 25.3*   < > 24.6* 28.1* 28.4*  MCV 90.6  --  92.3  --  95.3 97.6 96.6  PLT 189   < > 184  --  150 154 196   < > = values in this interval not displayed.   Cardiac Enzymes: No results for input(s): CKTOTAL, CKMB, CKMBINDEX, TROPONINI in the last 168 hours. CBG: No results for input(s): GLUCAP in the last 168 hours.  Iron Studies: No results for input(s): IRON, TIBC, TRANSFERRIN, FERRITIN in the last 72 hours. Studies/Results: No results found.  Medications: Infusions: . sodium chloride 10 mL/hr at 03/25/19 1607  . sodium chloride    . sodium chloride      Scheduled Medications: . allopurinol  100 mg Oral Daily  . arformoterol  15 mcg Nebulization BID  . budesonide (PULMICORT) nebulizer solution  0.5 mg Nebulization BID  . Chlorhexidine Gluconate Cloth  6 each Topical Q0600  . Chlorhexidine Gluconate Cloth  6 each Topical Q0600  . cholecalciferol  2,000 Units Oral Daily  . darbepoetin (ARANESP) injection - DIALYSIS  100 mcg Intravenous Q Tue-HD  . dicyclomine  20 mg Oral QHS  . doxercalciferol  2 mcg Intravenous Q T,Th,Sa-HD  . ferric citrate  630 mg Oral TID WC  . fluticasone furoate-vilanterol  1 puff  Inhalation Daily  . gabapentin  300 mg Oral QHS  . Gerhardt's butt cream   Topical TID  . ipratropium-albuterol  3 mL Nebulization BID  . metoprolol tartrate  100 mg Oral BID  . pantoprazole (PROTONIX) IV  40 mg Intravenous Q12H  . pravastatin  80 mg Oral QPM  . sucralfate  1 g Oral Q6H  . traMADol  50 mg Oral Once    have reviewed scheduled and prn medications.  Physical Exam: General: Intermittent agitation, not in distress, following simple commands. Heart:RRR, s1s2 nl Lungs:clear b/l, no crackle Abdomen:soft, Non-tender, non-distended Extremities:No edema Dialysis Access: Right IJ TDC.  [name] [name] [name] 03/29/2019,8:50 AM  LOS: 4 days  Pager: [phone]

## 2019-03-29 NOTE — TOC Initial Note (Signed)
Transition of Care [facility]) - Initial/Assessment Note    Patient Details  Name: Christina [name] MRN: [id number] Date of Birth: 09/05/1950  Transition of Care [facility]) CM/SW Contact:    Zenon Mayo, RN Phone Number: 03/29/2019, 4:59 PM  Clinical Narrative:                 NCM spoke with patient, asked to set her up with HHPT/HHOT, she states she does not want [place] services.  NCM asked if she has transportation home she states her sister will come to get her at dc.    Expected Discharge Plan: [place] Barriers to Discharge: Continued Medical Work up   Patient Goals and CMS Choice Patient states their goals for this hospitalization and ongoing recovery are:: go home      Expected Discharge Plan and Services Expected Discharge Plan: [place] In-house Referral: NA Discharge Planning Services: CM Consult   Living arrangements for the past 2 months: Single Family Home                 DME Arranged: (NA)         HH Arranged: Patient Refused HH          Prior Living Arrangements/Services Living arrangements for the past 2 months: Single Family Home Lives with:: Self Patient language and need for interpreter reviewed:: Yes Do you feel safe going back to the place where you live?: Yes      Need for Family Participation in Patient Care: Yes (Comment) Care giver support system in place?: Yes (comment)   Criminal Activity/Legal Involvement Pertinent to Current Situation/Hospitalization: No - Comment as needed  Activities of Daily Living Home Assistive Devices/Equipment: Cane (specify quad or straight), Walker (specify type), Shower chair with back ADL Screening (condition at time of admission) Patient's cognitive ability adequate to safely complete daily activities?: Yes Is the patient deaf or have difficulty hearing?: No Does the patient have difficulty seeing, even when wearing glasses/contacts?: No Does the patient have difficulty  concentrating, remembering, or making decisions?: No Patient able to express need for assistance with ADLs?: Yes Does the patient have difficulty dressing or bathing?: No Independently performs ADLs?: Yes (appropriate for developmental age) Does the patient have difficulty walking or climbing stairs?: Yes Weakness of Legs: Both Weakness of Arms/Hands: None  Permission Sought/Granted                  Emotional Assessment   Attitude/Demeanor/Rapport: Engaged Affect (typically observed): Appropriate Orientation: : Oriented to  Time, Oriented to Situation, Oriented to Place, Oriented to Self      Admission diagnosis:  Acute respiratory failure ([place]) [J96.00] Ischemic bowel disease ([place]) [K55.9] Generalized abdominal pain [R10.84] Atrial fibrillation with RVR ([place]) [I48.91] Chest pain, unspecified type [R07.9] Patient Active Problem List   Diagnosis Date Noted  . Atrial fibrillation with RVR ([place])   . Duodenal ulcer   . Gastritis and gastroduodenitis   . Acute blood loss anemia   . Ischemic bowel disease ([place]) 03/25/2019  . RUQ pain   . Anemia due to chronic kidney disease, on chronic dialysis ([place])   . AF (paroxysmal atrial fibrillation) ([place])   . Chest pain 03/19/2019  . Hyperkalemia 02/28/2019  . Uremia 02/27/2019  . Volume overload 02/21/2019  . Abdominal pain 02/21/2019  . Non-compliance with renal dialysis ([place]) 02/21/2019  . Respiratory failure ([place]) 02/04/2019  . Acute and chr resp failure, unsp w hypoxia or hypercapnia ([place])   .  Afib ([place]) 01/25/2019  . SBO (small bowel obstruction) ([place]) 08/10/2018  . Anemia due to chronic blood loss   . Heme positive stool   . Platelet inhibition due to Plavix   . Acute GI bleeding 05/06/2018  . ESRD (end stage renal disease) ([place]) 05/06/2018  . Anxiety 05/06/2018  . Bipolar affective ([place]) 05/06/2018  . CAD (coronary artery disease), native coronary artery 05/06/2018  . COPD with chronic bronchitis ([place]) 05/06/2018  .  CVA (cerebral vascular accident) ([place]) 05/06/2018  . Essential hypertension 05/06/2018  . Hyperlipidemia 05/06/2018   PCP:  Maggie Schwalbe, PA-C Pharmacy:   [place], [address] [place] [address] Phone: [phone] Fax: [phone]     Social Determinants of Health (SDOH) Interventions    Readmission Risk Interventions Readmission Risk Prevention Plan 03/29/2019 02/07/2019  Transportation Screening Complete Complete  Medication Review Press photographer) Complete Complete  PCP or Specialist appointment within 3-5 days of discharge - Complete  HRI or [place] Complete Complete  SW Recovery Care/Counseling Consult Complete Complete  Palliative Care Screening Not Applicable Not [place] Not Applicable Not Applicable  Some recent data might be hidden

## 2019-03-29 NOTE — Progress Notes (Signed)
PROGRESS NOTE    Christina [name]  JJH:[id number] DOB: [birth year] DOA: 03/25/2019 PCP: Maggie Schwalbe, PA-C    Brief Narrative:  Patient was admitted to the hospital with the working diagnosis of ischemic colitis, complicated with severe acute blood loss anemia due touppergi bleed, duodenal ulcer.  69 year old female with a past medical history for end-stage renal disease on hemodialysis(Tuesday, Thursday, Saturday),hypertension, atrial fibrillation, coronary disease, COPD and history of small bowel obstructions. Patient presented with abdominal pain and chest pain, associated with diarrhea and weakness. Apparently she missed her last hemodialysis session on 02/25.Her initial physical examination her blood pressure was 135/73, heart rate 124, temperature 98, respiratory 22, oxygen saturation 100%. She had coarse breath sounds bilaterally, diminished at bases, no wheezing, heart S1-S2 present, irregularly irregular, abdomen tender to palpation, distended, no rebound, positive bowel sounds, no lower extremity edema.  Sodium 136, potassium 4.0, chloride 98, bicarb 16, glucose 174, BUN 80, creatinine 5.6,troponin I 55, white count 10.8, hemoglobin 5.1, hematocrit 16.8, platelets 218.SARS COVID-19 negative. Urine analysis negative for infection. Drug screen positive for cocaine. 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 EKG 125 bpm, normal axis, normal QRS, atrial fibrillation rhythm, no ST segment changes, inferior lateral T wave inversions.  Received2unitspacked red blood cells, with good toleration. 02/27patient underwent hemodialysis and upper endoscopy. Surgery recommended conservative medical therapy.  Upper endoscopy 02/27, found a large  duodenal ulcer, patient placed on IV proton pump inhibitor and sucralfate. Atrial fibrillation with RVR converted to sinus rhythm with AV blockade  Hgb and Hct have remain stable, diet has been progressively advance with good toleration. Plan for possible dc home in am if continue stable cell count.     Assessment & Plan:   Principal Problem:   Ischemic bowel disease ([place]) Active Problems:   ESRD (end stage renal disease) ([place])   Bipolar affective ([place])   CAD (coronary artery disease), native coronary artery   COPD with chronic bronchitis (HCC)   CVA (cerebral vascular accident) ([place])   Essential hypertension   Hyperlipidemia   Anemia due to chronic blood loss   Afib (HCC)   AF (paroxysmal atrial fibrillation) ([place])   Duodenal ulcer   Gastritis and gastroduodenitis   Acute blood loss anemia   Atrial fibrillation with RVR ([place])    1. 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 Positive duodenal ulcers.sp twounit PRBC transfusion.Upper endoscopy with positive gastritis and duodenal ulcer (to large for endoscopic procedure).  This am Hgb 9,1 with Hct at 28,4, no melena or hematemesis.  Will continue with antiacid therapy, with pantoprazole IVbidand sucralfate qid. Advance diet to heart healthy.  Possible dc in am, if no further drop in Hgb or bleeding.   2. Atrial fibrillation with RVR/ not a good candidate for anticoagulation due to gi bleeding.  Will continue rate control with metoprolol 100 mg bid. Patient will need home health services at discharge.   3. ESRD on HD/ hyponatremia/ anemia of chronic renal disease with iron deficiency.HD access tunneled catheter right IJ,clinically euvolemic.   Underwent HD today with no major complications, Na continue to be low this am pre HD, 126, will follow on renal panel in am.   Continue withoral iron/ darbepoetin alfa and doxercalciferol.   4. COPD. No signs  of acute  exacerbation. Continue with bronchodilators/ inhaled steroids.   5. HTN.Peak systolic blood pressure 676, will resume antihypertensive agents, amlodipine and isosorbide.   6. Gout.No signs of acute flare, onallopurinol.   7. Dyslipidemia. Onpravastatin.  DVT prophylaxis:scd Code Status:full Family Communication:no family at the bedside Disposition Plan/ discharge barriers:patient from home, barriers to discharge acute blood loss anemia, required PRBC transfusion. Possible dc home in am if Hgb stable and no further bleeding.    Consultants:    Nephrology   GI      Subjective: At the time of my examination after hemodialysis, patient is calm and cooperative, no nausea or vomiting, no chest pain or dyspnea, diarrhea has resolved. Has been out of bed with the help of physical therapy.   Objective: Vitals:   03/29/19 1030 03/29/19 1100 03/29/19 1137 03/29/19 1202  BP: (!) 165/69 (!) 164/71  (!) 166/79  Pulse: 67 82  71  Resp: 11 20    Temp:  97.9 F (36.6 C) 98.1 F (36.7 C)   TempSrc:  Oral Oral   SpO2:      Weight:  46.5 kg    Height:        Intake/Output Summary (Last 24 hours) at 03/29/2019 1300 Last data filed at 03/29/2019 1100 Gross per 24 hour  Intake 480 ml  Output 1650 ml  Net -1170 ml   Filed Weights   03/28/19 0217 03/29/19 0655 03/29/19 1100  Weight: 45.6 kg 47.9 kg 46.5 kg    Examination:   General: Not in pain or dyspnea. Deconditioned  Neurology: Awake and alert, non focal  E ENT: mild pallor, no icterus, oral mucosa moist Cardiovascular: No JVD. S1-S2 present, rhythmic, no gallops, rubs, or murmurs. No lower extremity edema. Pulmonary: positive breath sounds bilaterally, adequate air movement, no wheezing, rhonchi or rales. Gastrointestinal. Abdomen with, no organomegaly, non tender, no rebound or guarding Skin. No rashes Musculoskeletal: no joint deformities HD access right IJJ tunneled catheter.     Data Reviewed:  I have personally reviewed following labs and imaging studies  CBC: Recent Labs  Lab 03/25/19 0611 03/25/19 1616 03/25/19 2007 03/25/19 2007 03/26/19 0236 03/26/19 1158 03/27/19 0221 03/28/19 0128 03/29/19 0138  WBC 10.8*   < > 14.7*  --  11.8*  --  9.7 7.3 11.1*  NEUTROABS 8.1*  --   --   --   --   --  7.3 5.0 8.9*  HGB 5.1*   < > 9.0*   < > 8.3* 9.5* 7.8* 8.9* 9.1*  HCT 16.8*   < > 26.9*   < > 25.3* 28.0* 24.6* 28.1* 28.4*  MCV 100.0   < > 90.6  --  92.3  --  95.3 97.6 96.6  PLT 218   < > 189  --  184  --  150 154 196   < > = values in this interval not displayed.   Basic Metabolic Panel: Recent Labs  Lab 03/25/19 0611 03/25/19 0611 03/25/19 1313 03/26/19 0236 03/26/19 1158 03/27/19 0221 03/28/19 0128 03/29/19 0138  NA 136   < >  --  140 134* 131* 129* 126*  K 4.0   < >  --  4.5 3.5 4.0 3.7 3.9  CL 98   < >  --  106 94* 98 96* 93*  CO2 16*  --   --  19*  --  26 21* 22  GLUCOSE 174*   < >  --  102* 89 91 89 82  BUN 80*   < >  --  75* 11 15 19  24*  CREATININE 5.62*   < >  --  5.93* 1.60* 2.81* 3.89* 4.63*  CALCIUM 8.3*  --   --  8.4*  --  7.7* 7.9* 7.8*  MG  --   --  1.9 1.9  --   --   --   --   PHOS  --   --  6.2* 6.5*  --   --  3.5  --    < > = values in this interval not displayed.   GFR: Estimated Creatinine Clearance: 8.5 mL/min (A) (by C-G formula based on SCr of 4.63 mg/dL (H)). Liver Function Tests: Recent Labs  Lab 03/25/19 0611 03/26/19 0236  AST 30 33  ALT 20 25  ALKPHOS 53 49  BILITOT 0.5 0.8  PROT 4.7* 4.4*  ALBUMIN 2.4* 2.2*   Recent Labs  Lab 03/25/19 0611 03/25/19 1313 03/26/19 0236  LIPASE 64*  --  64*  AMYLASE  --  97  --    No results for input(s): AMMONIA in the last 168 hours. Coagulation Profile: Recent Labs  Lab 03/25/19 1313  INR 1.1   Cardiac Enzymes: No results for input(s): CKTOTAL, CKMB, CKMBINDEX, TROPONINI in the last 168 hours. BNP (last 3 results) No results for input(s): PROBNP in the last 8760  hours. HbA1C: No results for input(s): HGBA1C in the last 72 hours. CBG: No results for input(s): GLUCAP in the last 168 hours. Lipid Profile: No results for input(s): CHOL, HDL, LDLCALC, TRIG, CHOLHDL, LDLDIRECT in the last 72 hours. Thyroid Function Tests: No results for input(s): TSH, T4TOTAL, FREET4, T3FREE, THYROIDAB in the last 72 hours. Anemia Panel: No results for input(s): VITAMINB12, FOLATE, FERRITIN, TIBC, IRON, RETICCTPCT in the last 72 hours.    Radiology Studies: I have reviewed all of the imaging during this hospital visit personally     Scheduled Meds: . allopurinol  100 mg Oral Daily  . arformoterol  15 mcg Nebulization BID  . budesonide (PULMICORT) nebulizer solution  0.5 mg Nebulization BID  . Chlorhexidine Gluconate Cloth  6 each Topical Q0600  . Chlorhexidine Gluconate Cloth  6 each Topical Q0600  . cholecalciferol  2,000 Units Oral Daily  . darbepoetin (ARANESP) injection - DIALYSIS  100 mcg Intravenous Q Tue-HD  . dicyclomine  20 mg Oral QHS  . doxercalciferol  2 mcg Intravenous Q T,Th,Sa-HD  . ferric citrate  630 mg Oral TID WC  . fluticasone furoate-vilanterol  1 puff Inhalation Daily  . gabapentin  300 mg Oral QHS  . Gerhardt's butt cream   Topical TID  . ipratropium-albuterol  3 mL Nebulization BID  . metoprolol tartrate  100 mg Oral BID  . pantoprazole (PROTONIX) IV  40 mg Intravenous Q12H  . pravastatin  80 mg Oral QPM  . sucralfate  1 g Oral Q6H  . traMADol  50 mg Oral Once   Continuous Infusions: . sodium chloride 10 mL/hr at 03/25/19 1607     LOS: 4 days        Damarcus Reggio Gerome Apley, MD

## 2019-03-30 DIAGNOSIS — I708 Atherosclerosis of other arteries: Secondary | ICD-10-CM

## 2019-03-30 DIAGNOSIS — F1490 Cocaine use, unspecified, uncomplicated: Secondary | ICD-10-CM

## 2019-03-30 LAB — BASIC METABOLIC PANEL
Anion gap: 8 (ref 5–15)
BUN: 7 mg/dL — ABNORMAL LOW (ref 8–23)
CO2: 26 mmol/L (ref 22–32)
Calcium: 7.7 mg/dL — ABNORMAL LOW (ref 8.9–10.3)
Chloride: 99 mmol/L (ref 98–111)
Creatinine, Ser: 2.54 mg/dL — ABNORMAL HIGH (ref 0.44–1.00)
GFR calc Af Amer: 22 mL/min — ABNORMAL LOW (ref >60)
GFR calc non Af Amer: 19 mL/min — ABNORMAL LOW (ref >60)
Glucose, Bld: 102 mg/dL — ABNORMAL HIGH (ref 70–99)
Potassium: 3.9 mmol/L (ref 3.5–5.1)
Sodium: 133 mmol/L — ABNORMAL LOW (ref 135–145)

## 2019-03-30 LAB — CBC WITH DIFFERENTIAL/PLATELET
Abs Immature Granulocytes: 0.05 10*3/uL (ref 0.00–0.07)
Basophils Absolute: 0 10*3/uL (ref 0.0–0.1)
Basophils Relative: 0 %
Eosinophils Absolute: 0.2 10*3/uL (ref 0.0–0.5)
Eosinophils Relative: 2 %
HCT: 27.8 % — ABNORMAL LOW (ref 36.0–46.0)
Hemoglobin: 8.9 g/dL — ABNORMAL LOW (ref 12.0–15.0)
Immature Granulocytes: 1 %
Lymphocytes Relative: 11 %
Lymphs Abs: 0.9 10*3/uL (ref 0.7–4.0)
MCH: 31 pg (ref 26.0–34.0)
MCHC: 32 g/dL (ref 30.0–36.0)
MCV: 96.9 fL (ref 80.0–100.0)
Monocytes Absolute: 0.6 10*3/uL (ref 0.1–1.0)
Monocytes Relative: 8 %
Neutro Abs: 6.5 10*3/uL (ref 1.7–7.7)
Neutrophils Relative %: 78 %
Platelets: 184 10*3/uL (ref 150–400)
RBC: 2.87 MIL/uL — ABNORMAL LOW (ref 3.87–5.11)
RDW: 16.5 % — ABNORMAL HIGH (ref 11.5–15.5)
WBC: 8.2 10*3/uL (ref 4.0–10.5)
nRBC: 0 % (ref 0.0–0.2)

## 2019-03-30 MED ORDER — CHLORHEXIDINE GLUCONATE CLOTH 2 % EX PADS
6.0000 | MEDICATED_PAD | Freq: Every day | CUTANEOUS | Status: DC
  Administered 2019-03-30: 6 via TOPICAL

## 2019-03-30 MED ORDER — DARBEPOETIN ALFA 100 MCG/0.5ML IJ SOSY: 100.0000 ug | PREFILLED_SYRINGE | INTRAMUSCULAR | Status: DC

## 2019-03-30 MED ORDER — SUCRALFATE 1 GM/10ML PO SUSP: 1.0000 g | Freq: Four times a day (QID) | ORAL | 1 refills | Status: DC

## 2019-03-30 MED ORDER — POLYETHYLENE GLYCOL 3350 17 G PO PACK: 17.0000 g | PACK | Freq: Every day | ORAL | 0 refills | Status: AC | PRN

## 2019-03-30 MED ORDER — PANTOPRAZOLE SODIUM 40 MG PO TBEC: 40.0000 mg | DELAYED_RELEASE_TABLET | Freq: Every day | ORAL | 1 refills | Status: DC

## 2019-03-30 MED FILL — SUCRALFATE 1 GM/10ML SUSP: 1 | 21 days supply | Qty: 840 | Fill #0

## 2019-03-30 MED FILL — PANTOPRAZOLE SOD DR 40 MG T: 40 | 90 days supply | Qty: 90 | Fill #0

## 2019-03-30 NOTE — Progress Notes (Signed)
Pt taken to front of hospital via w/c with staff to meet daughter

## 2019-03-30 NOTE — Plan of Care (Signed)
  Problem: Education: Goal: Knowledge of General Education information will improve Description: Including pain rating scale, medication(s)/side effects and non-pharmacologic comfort measures 03/30/2019 0730 by Shanon Ace, RN Outcome: Progressing 03/29/2019 1901 by Shanon Ace, RN Outcome: Progressing   Problem: Health Behavior/Discharge Planning: Goal: Ability to manage health-related needs will improve 03/30/2019 0730 by Shanon Ace, RN Outcome: Progressing 03/29/2019 1901 by Shanon Ace, RN Outcome: Progressing   Problem: Clinical Measurements: Goal: Ability to maintain clinical measurements within normal limits will improve 03/30/2019 0730 by Shanon Ace, RN Outcome: Progressing 03/29/2019 1901 by Shanon Ace, RN Outcome: Progressing Goal: Will remain free from infection 03/30/2019 0730 by Shanon Ace, RN Outcome: Progressing 03/29/2019 1901 by Shanon Ace, RN Outcome: Progressing Goal: Diagnostic test results will improve 03/30/2019 0730 by Shanon Ace, RN Outcome: Progressing 03/29/2019 1901 by Shanon Ace, RN Outcome: Progressing Goal: Respiratory complications will improve 03/30/2019 0730 by Shanon Ace, RN Outcome: Progressing 03/29/2019 1901 by Shanon Ace, RN Outcome: Progressing Goal: Cardiovascular complication will be avoided 03/30/2019 0730 by Shanon Ace, RN Outcome: Progressing 03/29/2019 1901 by Shanon Ace, RN Outcome: Progressing   Problem: Activity: Goal: Risk for activity intolerance will decrease 03/30/2019 0730 by Shanon Ace, RN Outcome: Progressing 03/29/2019 1901 by Shanon Ace, RN Outcome: Progressing   Problem: Nutrition: Goal: Adequate nutrition will be maintained 03/30/2019 0730 by Shanon Ace, RN Outcome: Progressing 03/29/2019 1901 by Shanon Ace, RN Outcome: Progressing   Problem: Coping: Goal: Level of anxiety will decrease 03/30/2019 0730 by Shanon Ace, RN Outcome: Progressing 03/29/2019 1901 by Shanon Ace,  RN Outcome: Progressing   Problem: Elimination: Goal: Will not experience complications related to bowel motility 03/30/2019 0730 by Shanon Ace, RN Outcome: Progressing 03/29/2019 1901 by Shanon Ace, RN Outcome: Progressing Goal: Will not experience complications related to urinary retention 03/30/2019 0730 by Shanon Ace, RN Outcome: Progressing 03/29/2019 1901 by Shanon Ace, RN Outcome: Progressing   Problem: Pain Managment: Goal: General experience of comfort will improve 03/30/2019 0730 by Shanon Ace, RN Outcome: Progressing 03/29/2019 1901 by Shanon Ace, RN Outcome: Progressing   Problem: Safety: Goal: Ability to remain free from injury will improve 03/30/2019 0730 by Shanon Ace, RN Outcome: Progressing 03/29/2019 1901 by Shanon Ace, RN Outcome: Progressing   Problem: Skin Integrity: Goal: Risk for impaired skin integrity will decrease 03/30/2019 0730 by Shanon Ace, RN Outcome: Progressing 03/29/2019 1901 by Shanon Ace, RN Outcome: Progressing

## 2019-03-30 NOTE — Progress Notes (Signed)
[facility] NEPHROLOGY PROGRESS NOTE  Assessment/ Plan: Pt is a 69 y.o. yo female with ESRD on HD TTS, HTN, A. fib, CAD, now with GI bleed and ischemic colitis.  HD orders: [place] T,Th,S 4 hrs 180NRe 250/Autoflow 1.5 44 kg 2.0 K/2.0 Ca UFP 2 -No heparin -Venofer 50 mg IV weekly (last dose 100 mg IV 03/22/19) -Calcitriol 1.0 mcg PO TIW  #Acute blood loss anemia/GI bleed: Status post PRBC transfusion.  EGD with multiple duodenal ulcers, large for endoscopy intervention.  GI is following.  Monitor hemoglobin. Started Aranesp.  # ESRD TTS: HD yesterday with 1.5 kg UF, tolerated well.  Plan for next HD tomorrow.  Right IJ TDC for the access.  # Secondary hyperparathyroidism: Phosphorus at goal.  Continue calcitriol and Auryxia.  # HTN/volume: Blood pressure is variable.  Continue to monitor.  UF during HD.  #Hyponatremia expect to improve with dialysis.  Minimize fluid intake.  Okay to discharge from renal perspective.  Subjective: Patient was seen and examined.  No new event.  No nausea, vomiting, chest pain, shortness of breath.  No further GI bleed.    Objective Vital signs in last 24 hours: Vitals:   03/30/19 0000 03/30/19 0400 03/30/19 0530 03/30/19 0720  BP:      Pulse: 79 78  77  Resp: 17 16  19   Temp:   98.8 F (37.1 C) 98.2 F (36.8 C)  TempSrc:   Oral Oral  SpO2: 98% 98%  96%  Weight:      Height:       Weight change: -1.4 kg  Intake/Output Summary (Last 24 hours) at 03/30/2019 0851 Last data filed at 03/30/2019 0400 Gross per 24 hour  Intake 720 ml  Output 1500 ml  Net -780 ml       Labs: Basic Metabolic Panel: Recent Labs  Lab 03/25/19 0611 03/25/19 1313 03/26/19 0236 03/26/19 1158 03/28/19 0128 03/29/19 0138 03/30/19 0227  NA   < >  --  140   < > 129* 126* 133*  K   < >  --  4.5   < > 3.7 3.9 3.9  CL   < >  --  106   < > 96* 93* 99  CO2   < >  --  19*   < > 21* 22 26  GLUCOSE   < >  --  102*   < > 89 82 102*  BUN   < >  --  75*    < > 19 24* 7*  CREATININE   < >  --  5.93*   < > 3.89* 4.63* 2.54*  CALCIUM   < >  --  8.4*   < > 7.9* 7.8* 7.7*  PHOS  --  6.2* 6.5*  --  3.5  --   --    < > = values in this interval not displayed.   Liver Function Tests: Recent Labs  Lab 03/25/19 0611 03/26/19 0236  AST 30 33  ALT 20 25  ALKPHOS 53 49  BILITOT 0.5 0.8  PROT 4.7* 4.4*  ALBUMIN 2.4* 2.2*   Recent Labs  Lab 03/25/19 0611 03/25/19 1313 03/26/19 0236  LIPASE 64*  --  64*  AMYLASE  --  97  --    No results for input(s): AMMONIA in the last 168 hours. CBC: Recent Labs  Lab 03/26/19 0236 03/26/19 1158 03/27/19 0221 03/27/19 0221 03/28/19 0128 03/29/19 0138 03/30/19 0227  WBC 11.8*  --  9.7   < >  7.3 11.1* 8.2  NEUTROABS  --   --  7.3   < > 5.0 8.9* 6.5  HGB 8.3*   < > 7.8*   < > 8.9* 9.1* 8.9*  HCT 25.3*   < > 24.6*   < > 28.1* 28.4* 27.8*  MCV 92.3  --  95.3  --  97.6 96.6 96.9  PLT 184  --  150   < > 154 196 184   < > = values in this interval not displayed.   Cardiac Enzymes: No results for input(s): CKTOTAL, CKMB, CKMBINDEX, TROPONINI in the last 168 hours. CBG: No results for input(s): GLUCAP in the last 168 hours.  Iron Studies: No results for input(s): IRON, TIBC, TRANSFERRIN, FERRITIN in the last 72 hours. Studies/Results: No results found.  Medications: Infusions: . sodium chloride 10 mL/hr at 03/25/19 1607    Scheduled Medications: . allopurinol  100 mg Oral Daily  . arformoterol  15 mcg Nebulization BID  . budesonide (PULMICORT) nebulizer solution  0.5 mg Nebulization BID  . Chlorhexidine Gluconate Cloth  6 each Topical Q0600  . Chlorhexidine Gluconate Cloth  6 each Topical Q0600  . cholecalciferol  2,000 Units Oral Daily  . [START ON 04/12/2019] darbepoetin (ARANESP) injection - DIALYSIS  100 mcg Intravenous Q Tue-HD  . dicyclomine  20 mg Oral QHS  . doxercalciferol  2 mcg Intravenous Q T,Th,Sa-HD  . ferric citrate  630 mg Oral TID WC  . fluticasone furoate-vilanterol  1  puff Inhalation Daily  . gabapentin  300 mg Oral QHS  . Gerhardt's butt cream   Topical TID  . ipratropium-albuterol  3 mL Nebulization BID  . metoprolol tartrate  100 mg Oral BID  . pantoprazole (PROTONIX) IV  40 mg Intravenous Q12H  . pravastatin  80 mg Oral QPM  . sucralfate  1 g Oral Q6H  . traMADol  50 mg Oral Once    have reviewed scheduled and prn medications.  Physical Exam: General: Calm, comfortable, not in distress. Heart:RRR, s1s2 nl Lungs:clear b/l, no crackle Abdomen:soft, Non-tender, non-distended Extremities:No edema Dialysis Access: Right IJ TDC.  Yarissa Reining [name] Yarenis Cerino 03/30/2019,8:51 AM  LOS: 5 days  Pager: [phone]

## 2019-03-30 NOTE — Consult Note (Signed)
[place] Nurse Consult Note: Patient receiving care in [facility] 2C10.  Dr. Cyndia Skeeters came in while I was getting the patient's foot pain history and doing my assessment. Reason for Consult: bilateral foot pain with ambulation, blackened right 5th toe Wound type: The patient told me the right 5th toenail got ripped off days ago and that is why the area is black.  It is a black scab without drainage, edema, or erythema. The right heel hurts all the time according to the patient, and is worse if the heel has pressure applied to it.  The left heel is painful with walking, but pain is not elicited when I apply pressure to the heel.  There is some slight discoloration along the dorsum of the left foot that the patient states has been there since she was 69 years old.  The pain to the dorsum of the left foot is worse when she wears a shoe, so she has been going shoeless for weeks.  She has not been to a podiatrist.  There are +1 palpable dorsalis pedis pulses bilaterally.  Pressure Injury POA: Yes/No/NA Measurement: Wound bed: Drainage (amount, consistency, odor)  Periwound: Dressing procedure/placement/frequency:  No dressing or treatment from a wound care perspective is indicated at this time.  Leave the scab on the right 5th toe alone, and eventually it will fall off. If the patient wants further investigation into the foot pain, a Podiatrist might be indicated.  Dr. Cyndia Skeeters will handle any referrals or tests he feels are appropriate. Thank you for the consult.  Discussed plan of care with the patient and bedside nurse.  [facility] nurse will not follow at this time.  Please re-consult the [place] team if needed.  Val Riles, RN, MSN, CWOCN, CNS-BC, pager [phone]

## 2019-03-30 NOTE — Discharge Summary (Signed)
Physician Discharge Summary  Christina [name] YDX:[id number] DOB: 02-08-1950 DOA: 03/25/2019  PCP: Maggie Schwalbe, PA-C  Admit date: 03/25/2019 Discharge date: 03/30/2019  Admitted From: Home Disposition: Home  Recommendations for Outpatient Follow-up:  1. Follow ups as below. 2. Please obtain CBC/BMP/Mag at follow up 3. Please follow up on the following pending results: GI pathology  Home Health: PT/OT Equipment/Devices: Patient has appropriate DME  Discharge Condition: Stable CODE STATUS: Full code  Follow-up Information    Nodal, [name], PA-C. Schedule an appointment as soon as possible for a visit in 1 week.   Specialty: Physician Assistant Why: 04/06/19  @8 :15 AM Contact information: [place] [address] [phone]           Hospital Course: 69 year old female with history of ESRD on HD TTS, HTN, paroxysmal A. fib, CAD, COPD and SBO presented with abdominal pain, chest pain, diarrhea and weakness, and admitted for acute blood loss anemia with hemoglobin of 5.1.   On admission, Hgb 5.1.  UDS positive for cocaine.  In A. fib with RVR.  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   Patient received2unitspacked red blood cells with appropriate response. She underwent hemodialysis and upper endoscopy. EGD revealed esophagitis, gastritis and multiple duodenal ulcers.  Pathology obtained from areas of gastritis and pending at time of discharge.  Patient placed on IV proton pump inhibitor and sucralfate.  H&H remained stable.  She was cleared for discharge by GI who recommended continuing PPI and Carafate.  From A. fib standpoint, she converted to normal sinus rhythm and remained stable.  She is not a candidate for anticoagulation due to GI bleed.  Patient was evaluated by PT/OT who recommended home health PT/OT.  She already has a walker and shower sit.   She lives with her daughter who would provide 24/7 supervision.  See individual problem list below for more hospital course.  Discharge Diagnoses:  Acute blood loss anemia due to upper GI bleed -EGD with esophagitis, gastritis and multiple duodenal ulcers-see details below. -H&H stable after 2 units of blood transfusion. -ESA and iron per nephrology -Protonix and Carafate -Discontinued aspirin on discharge.   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 -Conservative management   Atrial fibrillation with RVR: Converted to normal sinus rhythm -Continue metoprolol for rate control -Not a candidate for anticoagulation due to GI bleed  ESRD on HD TTS -Per nephrology  Chronic COPD -Discharged on home medications.  Essential HTN: Normotensive -Discharged on home medications  Cocaine use -Counseled  PAD?/Chronic bilateral lower extremity pain:  -Would benefit from ABI outpatient. -May consider resuming aspirin down the road -Reportedly followed by podiatry outpatient.    Discharge Instructions  Discharge Instructions    Call MD for:  difficulty breathing, headache or visual disturbances   Complete by: As directed    Call MD for:  extreme fatigue   Complete by: As directed    Call MD for:  persistant dizziness or light-headedness   Complete by: As directed    Call MD for:  persistant nausea and vomiting   Complete by: As directed    Call MD for:  severe uncontrolled pain   Complete by: As directed    Diet - low sodium heart healthy   Complete by: As directed    Discharge instructions   Complete by: As directed    It has been a pleasure  taking care of you! You were hospitalized with low blood level likely due to bleeding from your gastrointestinal tract.  You were treated with blood transfusion and medications.  Your hemoglobin remained stable.  We are discharging you on antacid for gastritis and duodenal ulcer.  We  have also stopped your aspirin. Please review your new medication list and the directions before you take your medications.  Please follow-up with your primary care doctor in 1 week to have your blood levels checked.   Take care,   Increase activity slowly   Complete by: As directed      Allergies as of 03/30/2019      Reactions   Cyclobenzaprine Anaphylaxis, Other (See Comments)   "stopped heart"   Morphine Anaphylaxis   "stopped heart"   Penicillins Shortness Of Breath, Swelling, Palpitations   Did it involve swelling of the face/tongue/throat, SOB, or low BP? Yes Did it involve sudden or severe rash/hives, skin peeling, or any reaction on the inside of your mouth or nose? No Did you need to seek medical attention at a hospital or doctor's office? No When did it last happen?years  If all above answers are "NO", may proceed with cephalosporin use.   Ambien [zolpidem] Other (See Comments)   Severe confusion   Codeine Itching, Rash   Hydromorphone Other (See Comments)   If administered quickly, felt like hand was "exploding"       Medication List    STOP taking these medications   aspirin EC 81 MG tablet   furosemide 40 MG tablet Commonly known as: LASIX     TAKE these medications   albuterol (2.5 MG/3ML) 0.083% nebulizer solution Commonly known as: PROVENTIL Take 2.5 mg by nebulization every 6 (six) hours as needed for wheezing or shortness of breath.   allopurinol 100 MG tablet Commonly known as: ZYLOPRIM Take 100 mg by mouth daily.   amLODipine 10 MG tablet Commonly known as: NORVASC Take 10 mg by mouth daily.   Auryxia 1 GM 210 MG(Fe) tablet Generic drug: ferric citrate Take 420-630 mg by mouth See admin instructions. Take 3 tablets (630 mg) by mouth three times daily with meals and 2 tablets (420 mg) with snacks   budesonide-formoterol 160-4.5 MCG/ACT inhaler Commonly known as: SYMBICORT Inhale 2 puffs into the lungs every 4 (four) hours as needed  (shortness of breath/wheezing).   dicyclomine 10 MG capsule Commonly known as: BENTYL Take 20 mg by mouth at bedtime.   docusate sodium 100 MG capsule Commonly known as: COLACE Take 1 capsule (100 mg total) by mouth 2 (two) times daily.   doxylamine (Sleep) 25 MG tablet Commonly known as: UNISOM Take 25 mg by mouth at bedtime as needed for sleep.   gabapentin 300 MG capsule Commonly known as: NEURONTIN Take 1 capsule (300 mg total) by mouth at bedtime.   Ipratropium-Albuterol 20-100 MCG/ACT Aers respimat Commonly known as: COMBIVENT Inhale 2 puffs into the lungs 4 (four) times daily as needed for wheezing.   isosorbide mononitrate 30 MG 24 hr tablet Commonly known as: IMDUR Take 30 mg by mouth daily.   lidocaine-prilocaine cream Commonly known as: EMLA Apply 1 application topically daily as needed (1-2 hours before dialysis (cover with occlusive dressing)).   LUBRICATING EYE DROPS OP Place 1 drop into both eyes daily as needed (dry eyes).   metoprolol tartrate 100 MG tablet Commonly known as: LOPRESSOR Take 1 tablet (100 mg total) by mouth 2 (two) times daily.   nitroGLYCERIN 0.4 MG SL tablet  Commonly known as: NITROSTAT Place 0.4 mg under the tongue every 5 (five) minutes as needed for chest pain.   ondansetron 4 MG tablet Commonly known as: ZOFRAN Take 4 mg by mouth 3 (three) times daily as needed for nausea.   pantoprazole 40 MG tablet Commonly known as: PROTONIX Take 1 tablet (40 mg total) by mouth daily.   polyethylene glycol 17 g packet Commonly known as: MIRALAX / GLYCOLAX Take 17 g by mouth daily as needed for mild constipation. What changed:   when to take this  reasons to take this   pravastatin 80 MG tablet Commonly known as: PRAVACHOL Take 80 mg by mouth every evening.   sennosides-docusate sodium 8.6-50 MG tablet Commonly known as: SENOKOT-S Take 1 tablet by mouth 2 (two) times daily.   sucralfate 1 GM/10ML suspension Commonly known as:  CARAFATE Take 10 mLs (1 g total) by mouth every 6 (six) hours.   Vitamin D3 50 MCG (2000 UT) capsule Take 2,000 Units by mouth daily.       Consultations:  Gastroenterology  Nephrology  Procedures/Studies:  2/27-EGD -LA Grade B esophagitis with no bleeding. - 2 cm hiatal hernia. - Gastroesophageal flap valve classified as Hill Grade IV (no fold, wide open lumen, hiatalhernia present). - Gastritis. Biopsied. - Multiple duodenal ulcers limited to the duodenal bulb. The largest was 15 mm and deeply cratered with pigmented material. This ulcer would be too large for successful endoscopic intervention. If concern for bleeding , recommend IR or surgical consultation. The CTA findings along with these endoscopic findings are concerning for ischemic etiology. The area was biopsied. - Normal second portion of the duodenum. Impression: - Return patient to hospital ward for ongoing care. - Clear liquid diet only today. - Use Protonix (pantoprazole) 40 mg IV BID. - Use sucralfate suspension 1 gram PO QID. - Continue serial H/H checks with blood products per protocol.   NM Hepatobiliary Liver Func  Result Date: 03/21/2019 CLINICAL DATA:  Upper abdominal and chest pain approximately 4 days. EXAM: NUCLEAR MEDICINE HEPATOBILIARY IMAGING WITH GALLBLADDER EF TECHNIQUE: Sequential images of the abdomen were obtained out to 60 minutes following intravenous administration of radiopharmaceutical. After oral ingestion of Ensure, gallbladder ejection fraction was determined. At 60 min, normal ejection fraction is greater than 33%. RADIOPHARMACEUTICALS:  4.78 mCi Tc-69m  Choletec IV COMPARISON:  Right upper quadrant ultrasound 03/19/2019. FINDINGS: Prompt uptake and biliary excretion of activity by the liver is seen. Gallbladder activity is visualized, consistent with patency of cystic duct. Biliary activity passes into small bowel, consistent with patent common bile duct. Calculated gallbladder ejection  fraction is 67%. (Normal gallbladder ejection fraction with Ensure is greater than 33%.) IMPRESSION: Normal exam. Electronically Signed   By: Inge Rise M.D.   On: 03/21/2019 12:11   DG CHEST PORT 1 VIEW  Result Date: 03/26/2019 CLINICAL DATA:  Acute respiratory failure. EXAM: PORTABLE CHEST 1 VIEW COMPARISON:  03/25/2019 FINDINGS: 0609 hours. Lungs are hyperexpanded. The cardio pericardial silhouette is enlarged. Interstitial markings are diffusely coarsened with chronic features. The lungs are clear without focal pneumonia, edema, pneumothorax or pleural effusion. Prominent skin fold over the left hemithorax. Right IJ central line tip overlies the SVC/RA junction possibly just into the upper right atrium. The visualized bony structures of the thorax are intact. Telemetry leads overlie the chest. IMPRESSION: Hyperexpansion with chronic interstitial coarsening. No acute cardiopulmonary findings. Electronically Signed   By: Misty Stanley M.D.   On: 03/26/2019 08:31   DG Chest [facility]  Result Date: 03/25/2019 CLINICAL DATA:  Weakness with abdominal pain. EXAM: PORTABLE CHEST 1 VIEW COMPARISON:  03/19/2019 FINDINGS: 7:50 a.m. Cardiopericardial silhouette is at upper limits of normal for size. The lungs are clear without focal pneumonia, edema, pneumothorax or pleural effusion. Interstitial markings are diffusely coarsened with chronic features. Right-sided central line tip is stable, positioned over the upper right atrium. The visualized bony structures of the thorax are intact. Telemetry leads overlie the chest. IMPRESSION: Stable.  No acute findings. Electronically Signed   By: Misty Stanley M.D.   On: 03/25/2019 08:26   DG Chest Port 1 View  Result Date: 03/19/2019 CLINICAL DATA:  Chest pain since last night with shortness of breath. Missed dialysis today and this past Tuesday. EXAM: PORTABLE CHEST 1 VIEW COMPARISON:  03/09/2019 FINDINGS: Right IJ dialysis catheter unchanged. Lungs are  hyperexpanded without lobar consolidation or effusion. Subtle hazy prominence of the central pulmonary vessels likely minimal vascular congestion. Cardiomediastinal silhouette and remainder of the exam is unchanged. IMPRESSION: Suggestion of minimal vascular congestion. Electronically Signed   By: Marin Olp M.D.   On: 03/19/2019 13:41   DG Abd Portable 1 View  Result Date: 03/18/2019 CLINICAL DATA:  Question small bowel obstruction. Clinical notes states abdominal pain. Vomiting. EXAM: PORTABLE ABDOMEN - 1 VIEW COMPARISON:  [facility] 03/09/2019 at [facility] , 9 days prior. Patient is also had abdominal CT 03/06/2019, 02/27/2019, and 02/21/2019. FINDINGS: There is contrast within the descending and sigmoid colon from prior radiologic exam. Small volume of stool in the ascending colon. Air-filled prominent but nondilated small bowel in the left abdomen. No abnormal gastric distension. Vascular calcifications. Renal calcifications on CT not well visualized radiographically. No evidence of free air. No acute osseous abnormalities are seen. IMPRESSION: No evidence of small-bowel obstruction or acute findings. There is enteric contrast within the descending and sigmoid colon from prior radiologic exam. Patient has had 4 abdominal CTs in the last 4 weeks, most recent CT was 03/09/2019 at [facility]. Electronically Signed   By: Keith Rake M.D.   On: 03/18/2019 03:07   CT Angio Chest/Abd/Pel for Dissection W and/or Wo Contrast  Addendum Date: 03/25/2019   ADDENDUM REPORT: 03/25/2019 09:28 ADDENDUM: These results were called by telephone at the time of interpretation on 03/25/2019 at 9:28 am to provider [facility] RAY , who verbally acknowledged these results. Electronically Signed   By: Zetta Bills M.D.   On: 03/25/2019 09:28   Result Date: 03/25/2019 CLINICAL DATA:  Abdominal pain, acute with nausea and vomiting for 1 week. EXAM: CT ANGIOGRAPHY CHEST, ABDOMEN AND PELVIS TECHNIQUE: Multidetector  CT imaging through the chest, abdomen and pelvis was performed using the standard protocol during bolus administration of intravenous contrast. Multiplanar reconstructed images and MIPs were obtained and reviewed to evaluate the vascular anatomy. CONTRAST:  83mL OMNIPAQUE IOHEXOL 350 MG/ML SOLN COMPARISON:  03/19/2019 and multiple prior studies. FINDINGS: CTA CHEST FINDINGS Cardiovascular: No signs of intramural hematoma within the aorta on noncontrast imaging. Low-attenuation cardiac blood pool compatible with anemia. Right sided catheter with small associated thrombus or fibrin sheath (image 39, series 8) findings are similar to the previous exam with low-attenuation crescent around the catheter in the proximal SVC. Abundant atherosclerotic plaque of the thoracic aorta both calcified but predominantly noncalcified without interval change with multiple areas of plaque irregularity but without signs of dissection. Patent great vessels in the chest. Heart size remains enlarged with extensive coronary artery disease, similar to the prior study. No signs of pericardial effusion. Mediastinum/Nodes:  Scattered lymph nodes about the mediastinum without change. No axillary lymphadenopathy. No thoracic inlet lymphadenopathy. Lungs/Pleura: No signs of consolidation or evidence of pleural effusion. Airways are patent. Signs of pulmonary emphysema with centrilobular predominance as noted on the previous study. Airways are patent. Scattered 2-3 mm pulmonary nodules with similar appearance most notably in the right upper lobe. Musculoskeletal: No signs of acute bone finding or evidence of destructive bone process. No chest wall mass. See below for for full detail. Review of the MIP images confirms the above findings. CTA ABDOMEN AND PELVIS FINDINGS VASCULAR Aorta: Abundant plaque in the abdominal aorta becoming less severe in the distal aorta. Caliber at the aortic hiatus 2.5 x 2.3 cm. Just below the renal arteries 2.5 by 2.4 cm.  Distal aorta just above the iliac bifurcation 2.4 x 2.2 cm. No signs of dissection in the abdominal aorta. 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 Collateral pathways arising from the SMA. Vessels in the celiac distribution remain attenuated but patent. SMA: SMA remains attenuated/narrowed with small lumen approximately 1 cm from its origin. Lumen between 2 and 3 mm at this level where as the origin with lumen approximately 5 mm, 7 mm presumed poststenotic dilation just beyond the area of narrowing as before. This shows patency distally but is again supplying the celiac distribution as well. No change since the prior study in this appearance. Renals: Small renal arteries bilaterally with attenuated vessel on the left supplying a scarred left kidney. Right renal artery with abundant plaque at the origin, vessel attenuated distally as before. IMA: IMA is not visualized. Distal aspect of the vessel with some branches that may show minimal opacification suggest collateral pathways. Inflow: Patent without evidence of aneurysm, dissection, vasculitis or significant stenosis. Veins: Limited assessment of venous structures on arterial phase. Review of the MIP images confirms the above findings. NON-VASCULAR Hepatobiliary: Liver is unremarkable. No signs of pericholecystic stranding. Gallstones layer in the gallbladder. No biliary ductal dilation. Pancreas: Pancreas unremarkable, no signs of peripancreatic inflammation or ductal dilation. Spleen: Spleen is normal. Adrenals/Urinary Tract: Adrenal masses bilaterally, on the left measuring 2.7 x 2.3 cm, on the right measuring 2.6 x 1.4 cm as compared to 2.5 x 1.9 cm. Lesions are similar over time accounting for differences in measurement technique dating back to July of 2020. The right adrenal mass is hyperenhancing on arterial phase more so than the left. Cortical scarring is evident bilaterally with  renal vascular calcifications and presumed cysts, also not changed. Urinary bladder is unremarkable. No signs of hydronephrosis. Stomach/Bowel: Under distension versus gastric thickening. Moderate-sized duodenal diverticulum. No signs of bowel obstruction. Post small bowel resection in the left hemiabdomen. Colon is partially fluid-filled. Lymphatic: No signs of abdominal adenopathy. No signs of pelvic lymphadenopathy. Reproductive: Post hysterectomy. Other: No free air. No significant hernia or ascites. Musculoskeletal: No acute bone finding or destructive bone process. Review of the MIP images confirms the above findings. IMPRESSION: CTA CHEST 1. No evidence of acute intramural hematoma or dissection involving the thoracic aorta. 2. Abundant atherosclerotic plaque of the thoracic aorta both calcified but predominantly noncalcified. 3. Cardiomegaly with extensive coronary artery disease. 4. Fibrin sheath or small amount of adherent thrombus along the right IJ catheter in the proximal SVC. 5. Tiny pulmonary nodules are unchanged. CTA ABDOMEN AND PELVIS 1. 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 Also with probable occlusion of the IMA origin.  Given this configuration the patient is at high risk for mesenteric ischemia and or mesenteric angina. No overt signs of ischemia on today's scan. Lactate correlation may be helpful. 2. No signs of bowel obstruction. 3. Bilateral adrenal masses. The left having a more benign appearance. Hyperenhancement and baseline hyperdensity of the right adrenal mass despite stability could be indicative of pheochromocytoma. Biochemical correlation may be helpful if not yet performed. 4. Cholelithiasis as before. 5. Under distension versus gastric thickening correlate with any symptoms of gastritis. 6. Emphysema and aortic atherosclerosis. 7. Lucency of distal humerus may be related to artifact on the  scout view. Correlate with any pain in this area and consider dedicated plain film assessment as warranted. 8. Call is out to the referring provider to further discuss findings in the above case. Aortic Atherosclerosis (ICD10-I70.0) and Emphysema (ICD10-J43.9). Electronically Signed: By: Zetta Bills M.D. On: 03/25/2019 09:19   CT Angio Chest/Abd/Pel for Dissection W and/or Wo Contrast  Result Date: 03/19/2019 CLINICAL DATA:  69 year old female with history of chest and back pain. Suspected aortic dissection. EXAM: CT ANGIOGRAPHY CHEST, ABDOMEN AND PELVIS TECHNIQUE: Multidetector CT imaging through the chest, abdomen and pelvis was performed using the standard protocol during bolus administration of intravenous contrast. Multiplanar reconstructed images and MIPs were obtained and reviewed to evaluate the vascular anatomy. CONTRAST:  64mL OMNIPAQUE IOHEXOL 350 MG/ML SOLN COMPARISON:  CT the abdomen and pelvis 03/09/2019. CT the abdomen and pelvis 02/27/2019. Chest CT 04/05/2018. FINDINGS: CTA CHEST FINDINGS Cardiovascular: Noncontrast images demonstrate no crescentic high attenuation associated with the thoracic aorta to suggest acute intramural hematoma. Postcontrast images demonstrate no definite aortic dissection. There is extensive irregular atheromatous plaque throughout the thoracic aorta with multiple areas of ulceration, but no frank dissection or penetration. Heart size is normal. There is no significant pericardial fluid, thickening or pericardial calcification. There is aortic atherosclerosis, as well as atherosclerosis of the great vessels of the mediastinum and the coronary arteries, including calcified atherosclerotic plaque in the left main, left anterior descending, left circumflex and right coronary arteries. Calcifications of the mitral annulus. Right internal jugular PermCath with tip terminating in the right atrium. Mediastinum/Nodes: No pathologically enlarged mediastinal or hilar lymph  nodes. Esophagus is unremarkable in appearance. No axillary lymphadenopathy. Lungs/Pleura: A few scattered tiny 2-3 mm pulmonary nodules are noted throughout the lungs bilaterally. No other larger more suspicious appearing pulmonary nodules or masses are noted. Mild diffuse bronchial wall thickening with mild centrilobular and paraseptal emphysema. Linear scarring or subsegmental atelectasis in the dependent portion of the left lower lobe. Musculoskeletal: There are no aggressive appearing lytic or blastic lesions noted in the visualized portions of the skeleton. Review of the MIP images confirms the above findings. CTA ABDOMEN AND PELVIS FINDINGS VASCULAR Aorta: Normal caliber aorta without aneurysm, dissection, vasculitis or significant stenosis. However, there is extensive atheromatous plaque, much of which is highly irregular with several areas of ulcerative plaque throughout the abdominal aorta. Celiac: Ostium of the vessel is not confidently identified and is either severely stenotic or completely occluded. However, distal aspects of the vessel and its major branches are patent, potentially via collateral flow. SMA: Patent with mild stenosis proximally (2 mm) followed by poststenotic dilatation (7 mm). Renals: Both renal arteries are patent without evidence of aneurysm, dissection, vasculitis, fibromuscular dysplasia or significant stenosis. IMA: Ostium of the vessel is not confidently identified, potentially completely occluded. Distal aspect of the vessel is patent, presumably fed by collateral pathways. Inflow: Patent without evidence of aneurysm, dissection, vasculitis  or significant stenosis. Veins: No obvious venous abnormality within the limitations of this arterial phase study. Review of the MIP images confirms the above findings. NON-VASCULAR Hepatobiliary: No suspicious cystic or solid hepatic lesions. No intra or extrahepatic biliary ductal dilatation. Gallbladder wall appears thickened and  edematous (up to 1 cm in thickness). Gallbladder is only mildly distended. No calcified gallstones identified. Pancreas: No pancreatic mass. No pancreatic ductal dilatation. No pancreatic or peripancreatic fluid collections or inflammatory changes. Spleen: Unremarkable. Adrenals/Urinary Tract: 2.8 x 2.4 cm low-attenuation (-2 HU) left adrenal nodule (axial image 155 of series 7), compatible with an adenoma. 2.5 x 1.9 cm right adrenal nodule which demonstrates avid enhancement (axial image 151 of series 7). Multiple calcifications associated with the renal hila bilaterally, favored to be vascular, although nonobstructive calculi are not excluded. Subcentimeter low-attenuation lesions in both kidneys, too small to characterize, but statistically likely to represent cysts. 1.5 cm low-attenuation lesion in the upper pole the left kidney, compatible with a simple cyst. No suspicious renal lesions. Mild bilateral renal atrophy. No hydroureteronephrosis. Urinary bladder is nearly completely decompressed around an indwelling Foley balloon catheter. Stomach/Bowel: Normal appearance of the stomach. No pathologic dilatation of small bowel or colon. The appendix is not confidently identified and may be surgically absent. Regardless, there are no inflammatory changes noted adjacent to the cecum to suggest the presence of an acute appendicitis at this time. Lymphatic: No lymphadenopathy noted in the abdomen or pelvis. Reproductive: Status post hysterectomy. Ovaries are not confidently identified may be surgically absent or atrophic. Other: No significant volume of ascites.  No pneumoperitoneum. Musculoskeletal: There are no aggressive appearing lytic or blastic lesions noted in the visualized portions of the skeleton. Review of the MIP images confirms the above findings. IMPRESSION: 1. 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 2. Gallbladder wall thickening and edema. No calcified gallstones. Gallbladder is only mildly distended. Overall, findings are equivocal for acute cholecystitis, but further clinical evaluation is recommended. If there is clinical concern for acute cholecystitis, further evaluation with right upper quadrant ultrasound would be recommended. 3. 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 4. Bilateral adrenal nodules, similar to prior studies. The left adrenal nodule is compatible with a benign adenoma. The avidly enhancing right adrenal nodule is incompletely characterized, but appears relatively stable in size compared to numerous prior examinations, suggesting a benign lesion. 5. There is also left main and 3 vessel coronary artery disease. Please note that although the presence of coronary artery calcium documents the presence of coronary artery disease, the severity of this disease and any potential stenosis cannot be assessed on this non-gated CT examination. Assessment for potential risk factor modification, dietary therapy or pharmacologic therapy may be warranted, if clinically indicated. 6. Scattered tiny 2-3 mm pulmonary nodules noted in the lungs bilaterally, nonspecific, but statistically likely benign. No follow-up needed if patient is low-risk (and has no known or suspected primary neoplasm). Non-contrast chest CT can be considered in 12 months if patient is high-risk. This recommendation follows the consensus statement: Guidelines for Management of Incidental Pulmonary Nodules Detected on CT Images: From the Fleischner Society 2017; Radiology 2017; 284:228-243. 7. Additional incidental findings, as above. Electronically Signed   By: Vinnie Langton M.D.   On: 03/19/2019 15:55   US Abdomen Limited RUQ  Result Date:  03/19/2019 CLINICAL DATA:  Right  upper quadrant pain EXAM: ULTRASOUND ABDOMEN LIMITED RIGHT UPPER QUADRANT COMPARISON:  None. FINDINGS: Gallbladder: No gallstones. Positive sonographic Murphy sign noted by sonographer. Diffuse gallbladder wall thickening with small amount of pericholecystic fluid. Common bile duct: Diameter: 3 mm Liver: No focal lesion identified. Within normal limits in parenchymal echogenicity. Portal vein is patent on color Doppler imaging with normal direction of blood flow towards the liver. Other: None. IMPRESSION: Positive sonographic Murphy sign with gallbladder wall thickening and pericholecystic fluid, but no cholelithiasis. Electronically Signed   By: Ulyses Jarred M.D.   On: 03/19/2019 18:53       Discharge Exam: Vitals:   03/30/19 0857 03/30/19 1025  BP:  (!) 190/67  Pulse: 80 78  Resp: 20 17  Temp:  99 F (37.2 C)  SpO2: 100% 100%    GENERAL: No acute distress.  Appears well.  HEENT: MMM.  Vision and hearing grossly intact.  NECK: Supple.  No apparent JVD.  RESP:  No IWOB. Good air movement bilaterally. CVS:  RRR. Heart sounds normal.  Diminished DP pulse in LLE. ABD/GI/GU: Bowel sounds present. Soft. Non tender.  MSK/EXT:  Moves extremities. No apparent deformity or edema.  SKIN: no apparent skin lesion or wound NEURO: Awake, alert and oriented appropriately.  No apparent focal neuro deficit. PSYCH: Calm. Normal affect.   The results of significant diagnostics from this hospitalization (including imaging, microbiology, ancillary and laboratory) are listed below for reference.     Microbiology: Recent Results (from the past 240 hour(s))  SARS CORONAVIRUS 2 (TAT 6-24 HRS) Nasopharyngeal Nasopharyngeal Swab     Status: None   Collection Time: 03/25/19 10:26 AM   Specimen: Nasopharyngeal Swab  Result Value Ref Range Status   SARS Coronavirus 2 NEGATIVE NEGATIVE Final    Comment: (NOTE) SARS-CoV-2 target nucleic acids are NOT DETECTED. The SARS-CoV-2 RNA is generally detectable in  upper and lower respiratory specimens during the acute phase of infection. Negative results do not preclude SARS-CoV-2 infection, do not rule out co-infections with other pathogens, and should not be used as the sole basis for treatment or other patient management decisions. Negative results must be combined with clinical observations, patient history, and epidemiological information. The expected result is Negative. Fact Sheet for Patients: SugarRoll.be Fact Sheet for Healthcare Providers: https://www.woods-mathews.com/ This test is not yet approved or cleared by the [place] FDA and  has been authorized for detection and/or diagnosis of SARS-CoV-2 by FDA under an Emergency Use Authorization (EUA). This EUA will remain  in effect (meaning this test can be used) for the duration of the COVID-19 declaration under Section 56 4(b)(1) of the Act, 21 U.S.C. section 360bbb-3(b)(1), unless the authorization is terminated or revoked sooner. Performed at [facility], [place] [address], [place], [place] [address]      Labs: BNP (last 3 results) Recent Labs    01/24/19 2228 02/04/19 0103 03/25/19 1313  BNP 1,570.0* 4,197.2* 9,833.8*   Basic Metabolic Panel: Recent Labs  Lab 03/25/19 2505 03/25/19 1313 03/26/19 0236 03/26/19 0236 03/26/19 1158 03/27/19 0221 03/28/19 0128 03/29/19 0138 03/30/19 0227  NA   < >  --  140   < > 134* 131* 129* 126* 133*  K   < >  --  4.5   < > 3.5 4.0 3.7 3.9 3.9  CL   < >  --  106   < > 94* 98 96* 93* 99  CO2   < >  --  19*  --   --  26 21* 22 26  GLUCOSE   < >  --  102*   < > 89 91 89 82 102*  BUN   < >  --  75*   < > 11 15 19  24* 7*  CREATININE   < >  --  5.93*   < > 1.60* 2.81* 3.89* 4.63* 2.54*  CALCIUM   < >  --  8.4*  --   --  7.7* 7.9* 7.8* 7.7*  MG  --  1.9 1.9  --   --   --   --   --   --   PHOS  --  6.2* 6.5*  --   --   --  3.5  --   --    < > = values in this interval not displayed.    Liver Function Tests: Recent Labs  Lab 03/25/19 0611 03/26/19 0236  AST 30 33  ALT 20 25  ALKPHOS 53 49  BILITOT 0.5 0.8  PROT 4.7* 4.4*  ALBUMIN 2.4* 2.2*   Recent Labs  Lab 03/25/19 0611 03/25/19 1313 03/26/19 0236  LIPASE 64*  --  64*  AMYLASE  --  97  --    No results for input(s): AMMONIA in the last 168 hours. CBC: Recent Labs  Lab 03/25/19 0611 03/25/19 1616 03/26/19 0236 03/26/19 0236 03/26/19 1158 03/27/19 0221 03/28/19 0128 03/29/19 0138 03/30/19 0227  WBC 10.8*   < > 11.8*  --   --  9.7 7.3 11.1* 8.2  NEUTROABS 8.1*  --   --   --   --  7.3 5.0 8.9* 6.5  HGB 5.1*   < > 8.3*   < > 9.5* 7.8* 8.9* 9.1* 8.9*  HCT 16.8*   < > 25.3*   < > 28.0* 24.6* 28.1* 28.4* 27.8*  MCV 100.0   < > 92.3  --   --  95.3 97.6 96.6 96.9  PLT 218   < > 184  --   --  150 154 196 184   < > = values in this interval not displayed.   Cardiac Enzymes: No results for input(s): CKTOTAL, CKMB, CKMBINDEX, TROPONINI in the last 168 hours. BNP: Invalid input(s): POCBNP CBG: No results for input(s): GLUCAP in the last 168 hours. D-Dimer No results for input(s): DDIMER in the last 72 hours. Hgb A1c No results for input(s): HGBA1C in the last 72 hours. Lipid Profile No results for input(s): CHOL, HDL, LDLCALC, TRIG, CHOLHDL, LDLDIRECT in the last 72 hours. Thyroid function studies No results for input(s): TSH, T4TOTAL, T3FREE, THYROIDAB in the last 72 hours.  Invalid input(s): FREET3 Anemia work up No results for input(s): VITAMINB12, FOLATE, FERRITIN, TIBC, IRON, RETICCTPCT in the last 72 hours. Urinalysis    Component Value Date/Time   COLORURINE YELLOW 03/25/2019 1009   APPEARANCEUR CLEAR 03/25/2019 1009   LABSPEC 1.013 03/25/2019 1009   PHURINE 7.0 03/25/2019 1009   GLUCOSEU 50 (A) 03/25/2019 1009   HGBUR NEGATIVE 03/25/2019 1009   BILIRUBINUR NEGATIVE 03/25/2019 1009   KETONESUR NEGATIVE 03/25/2019 1009   PROTEINUR 100 (A) 03/25/2019 1009   NITRITE NEGATIVE  03/25/2019 1009   LEUKOCYTESUR NEGATIVE 03/25/2019 1009   Sepsis Labs Invalid input(s): PROCALCITONIN,  WBC,  LACTICIDVEN   Time coordinating discharge: 35 minutes  SIGNED:  Mercy Riding, MD  [facility] 03/30/2019, 5:16 PM  If 7PM-7AM, please contact night-coverage [email or web address] Password [email or web address]

## 2019-03-30 NOTE — Progress Notes (Signed)
Physical Therapy Treatment Patient Details Name: Christina [name] MRN: [id number] DOB: 05/14/1950 Today's Date: 03/30/2019    History of Present Illness 69 year old woman with ESRD on dialysis, atrial fibrillation without anticoagulation, COPD, multiple admits for abdominal pain, discharged on 2/23 where acalculus cholecystitis was being considered and peptic ulcer disease given duodenal thickening.    PT Comments    Pt with bilat foot pain at 10/10 limiting ambulation tolerance and requiring use of RW today. Alerted RN of bilat foot pain, wound care consult put in. Pt very impulsive with decreased insight to safety however suspect this to be baseline.  Acute PT to cont to follow.   Follow Up Recommendations  Home health PT;Supervision - Intermittent     Equipment Recommendations  None recommended by PT    Recommendations for Other Services       Precautions / Restrictions Precautions Precautions: Fall Restrictions Weight Bearing Restrictions: No    Mobility  Bed Mobility Overal bed mobility: Independent             General bed mobility comments: HOB elevated, able to transfer self to EOB and back to bed without assist  Transfers Overall transfer level: Needs assistance Equipment used: None Transfers: Sit to/from Stand Sit to Stand: Min guard         General transfer comment: pt impulsive, standing up on lines requiring verbal cues ot sit and look at her surroundings  Ambulation/Gait Ambulation/Gait assistance: Min guard Gait Distance (Feet): 80 Feet Assistive device: Rolling walker (2 wheeled) Gait Pattern/deviations: Step-through pattern;Decreased stride length;Antalgic Gait velocity: slow Gait velocity interpretation: <1.31 ft/sec, indicative of household ambulator General Gait Details: pt with bilat foot pain requiring use of RW today vs no AD on monday. Pt with decreased amb tolderance as well due to bilat foot pain   Stairs              Wheelchair Mobility    Modified Rankin (Stroke Patients Only)       Balance Overall balance assessment: Mild deficits observed, not formally tested                                          Cognition Arousal/Alertness: Awake/alert Behavior During Therapy: Impulsive Overall Cognitive Status: Impaired/Different from baseline Area of Impairment: Safety/judgement                         Safety/Judgement: Decreased awareness of safety     General Comments: Pt talking to herself in the room, pt irritated by the bilat foot pain.       Exercises      General Comments General comments (skin integrity, edema, etc.): VSS, pt with black scab on R pinky toe and cracked/dry skin on L heel and discoloration on plantar 4th digit, notified RW      Pertinent Vitals/Pain Pain Assessment: 0-10 Pain Score: 10-Worst pain ever Pain Location: R pinky toe, L heel and 4th digit Pain Descriptors / Indicators: Sore Pain Intervention(s): Monitored during session    Home Living                      Prior Function            PT Goals (current goals can now be found in the care plan section) Acute Rehab PT Goals Patient Stated Goal: stop the foot pain Progress  towards PT goals: Not progressing toward goals - comment(limited by pain in bilat feet)    Frequency    Min 3X/week      PT Plan Current plan remains appropriate    Co-evaluation              AM-PAC PT "6 Clicks" Mobility   Outcome Measure  Help needed turning from your back to your side while in a flat bed without using bedrails?: None Help needed moving from lying on your back to sitting on the side of a flat bed without using bedrails?: None Help needed moving to and from a bed to a chair (including a wheelchair)?: A Little Help needed standing up from a chair using your arms (e.g., wheelchair or bedside chair)?: A Little Help needed to walk in hospital room?: A Little Help  needed climbing 3-5 steps with a railing? : A Little 6 Click Score: 20    End of Session Equipment Utilized During Treatment: Gait belt Activity Tolerance: Patient limited by pain Patient left: in chair;with call bell/phone within reach;with chair alarm set Nurse Communication: Mobility status PT Visit Diagnosis: Unsteadiness on feet (R26.81);Muscle weakness (generalized) (M62.81);Difficulty in walking, not elsewhere classified (R26.2)     Time: 7471-5953 PT Time Calculation (min) (ACUTE ONLY): 19 min  Charges:  $Gait Training: 8-22 mins                     Kittie Plater, PT, DPT Acute Rehabilitation Services Pager #: [phone] Office #: [phone]    Berline Lopes 03/30/2019, 10:27 AM

## 2019-03-30 NOTE — Progress Notes (Signed)
Pt discharging home with daughter.  MD spoke to daughter, she told MD that the Pt was going home with her. I asked Pt where she was going.. She said she was going to go home and get some things, and the go and stay with her daughter.  When asked if she was going to get home PT, she said that she was not she did not know how long she was staying at daughter's., that she would be living with other daughter as well that rents, and that this daughter may have to move at sometime and could interfere with the PT.  She did say at this time she is leaving the hospital and living with her daughter that owns a home.

## 2019-03-30 NOTE — Progress Notes (Signed)
Performed discharge med counseling with patient and via phone with daughter. Covered new sucralfate QID. Educated about importance of it being used QID and continue regular Protonix. Made sure they know to stop aspirin.   Romeo Rabon, PharmD Admin. Coordinator of Transitions of Care 03/30/2019,12:05 PM

## 2019-03-31 ENCOUNTER — Telehealth: Payer: Self-pay | Admitting: Physician Assistant

## 2019-03-31 NOTE — Telephone Encounter (Signed)
Transition of care from inpatient facility  Date of discharge: 03/30/19 Date of contact: 03/31/19 Method of contact: Phone  Contacted patient to discuss transition of care from inpatient facility. Patient did not answer but called back and we were able to speak. Pt was discharged from [facility] on [facility]/30/19 with a diagnosis of GI bleed and a. Fib RVR. Discharge medications and follow up appointments reviewed. Patient did not go to HD today but reports she is going to go tomorrow. Reports her daughter is staying with her to help with her ADLs. No further needs today.  Anice Paganini, PA-C 03/31/2019, 3:19 PM  [facility] Pager: [phone]

## 2019-04-04 ENCOUNTER — Observation Stay (HOSPITAL_COMMUNITY)
Admission: AD | Admit: 2019-04-04 | Discharge: 2019-04-04 | Disposition: A | Discharge status: Home / Self Care | Payer: Medicare Other | Source: Other Acute Inpatient Hospital | Attending: Internal Medicine | Admitting: Internal Medicine

## 2019-04-04 ENCOUNTER — Other Ambulatory Visit: Payer: Self-pay

## 2019-04-04 ENCOUNTER — Encounter (HOSPITAL_COMMUNITY): Payer: Self-pay | Admitting: Family Medicine

## 2019-04-04 DIAGNOSIS — Z992 Dependence on renal dialysis: Secondary | ICD-10-CM | POA: Insufficient documentation

## 2019-04-04 DIAGNOSIS — K920 Hematemesis: Secondary | ICD-10-CM | POA: Diagnosis present

## 2019-04-04 DIAGNOSIS — N189 Chronic kidney disease, unspecified: Secondary | ICD-10-CM | POA: Diagnosis not present

## 2019-04-04 DIAGNOSIS — K2100 Gastro-esophageal reflux disease with esophagitis, without bleeding: Secondary | ICD-10-CM | POA: Insufficient documentation

## 2019-04-04 DIAGNOSIS — D631 Anemia in chronic kidney disease: Secondary | ICD-10-CM

## 2019-04-04 DIAGNOSIS — M109 Gout, unspecified: Secondary | ICD-10-CM | POA: Diagnosis not present

## 2019-04-04 DIAGNOSIS — E785 Hyperlipidemia, unspecified: Secondary | ICD-10-CM | POA: Diagnosis not present

## 2019-04-04 DIAGNOSIS — I2510 Atherosclerotic heart disease of native coronary artery without angina pectoris: Secondary | ICD-10-CM | POA: Insufficient documentation

## 2019-04-04 DIAGNOSIS — Z79899 Other long term (current) drug therapy: Secondary | ICD-10-CM | POA: Diagnosis not present

## 2019-04-04 DIAGNOSIS — K264 Chronic or unspecified duodenal ulcer with hemorrhage: Secondary | ICD-10-CM | POA: Diagnosis present

## 2019-04-04 DIAGNOSIS — Z7951 Long term (current) use of inhaled steroids: Secondary | ICD-10-CM | POA: Diagnosis not present

## 2019-04-04 DIAGNOSIS — I129 Hypertensive chronic kidney disease with stage 1 through stage 4 chronic kidney disease, or unspecified chronic kidney disease: Secondary | ICD-10-CM | POA: Diagnosis not present

## 2019-04-04 DIAGNOSIS — F319 Bipolar disorder, unspecified: Secondary | ICD-10-CM | POA: Diagnosis present

## 2019-04-04 DIAGNOSIS — M1990 Unspecified osteoarthritis, unspecified site: Secondary | ICD-10-CM | POA: Diagnosis not present

## 2019-04-04 DIAGNOSIS — I480 Paroxysmal atrial fibrillation: Secondary | ICD-10-CM | POA: Diagnosis not present

## 2019-04-04 DIAGNOSIS — J449 Chronic obstructive pulmonary disease, unspecified: Secondary | ICD-10-CM | POA: Insufficient documentation

## 2019-04-04 DIAGNOSIS — K2970 Gastritis, unspecified, without bleeding: Secondary | ICD-10-CM | POA: Diagnosis not present

## 2019-04-04 DIAGNOSIS — K269 Duodenal ulcer, unspecified as acute or chronic, without hemorrhage or perforation: Secondary | ICD-10-CM | POA: Diagnosis not present

## 2019-04-04 DIAGNOSIS — K2990 Gastroduodenitis, unspecified, without bleeding: Secondary | ICD-10-CM

## 2019-04-04 DIAGNOSIS — N186 End stage renal disease: Secondary | ICD-10-CM

## 2019-04-04 DIAGNOSIS — Z20822 Contact with and (suspected) exposure to covid-19: Secondary | ICD-10-CM | POA: Insufficient documentation

## 2019-04-04 DIAGNOSIS — Z87891 Personal history of nicotine dependence: Secondary | ICD-10-CM | POA: Insufficient documentation

## 2019-04-04 DIAGNOSIS — J4489 Other specified chronic obstructive pulmonary disease: Secondary | ICD-10-CM | POA: Diagnosis present

## 2019-04-04 LAB — CBC WITH DIFFERENTIAL/PLATELET
Abs Immature Granulocytes: 0.04 10*3/uL (ref 0.00–0.07)
Basophils Absolute: 0.1 10*3/uL (ref 0.0–0.1)
Basophils Relative: 1 %
Eosinophils Absolute: 0.4 10*3/uL (ref 0.0–0.5)
Eosinophils Relative: 5 %
HCT: 29.6 % — ABNORMAL LOW (ref 36.0–46.0)
Hemoglobin: 9.1 g/dL — ABNORMAL LOW (ref 12.0–15.0)
Immature Granulocytes: 0 %
Lymphocytes Relative: 13 %
Lymphs Abs: 1.2 10*3/uL (ref 0.7–4.0)
MCH: 30.1 pg (ref 26.0–34.0)
MCHC: 30.7 g/dL (ref 30.0–36.0)
MCV: 98 fL (ref 80.0–100.0)
Monocytes Absolute: 0.7 10*3/uL (ref 0.1–1.0)
Monocytes Relative: 8 %
Neutro Abs: 6.7 10*3/uL (ref 1.7–7.7)
Neutrophils Relative %: 73 %
Platelets: 240 10*3/uL (ref 150–400)
RBC: 3.02 MIL/uL — ABNORMAL LOW (ref 3.87–5.11)
RDW: 15.4 % (ref 11.5–15.5)
WBC: 9.2 10*3/uL (ref 4.0–10.5)
nRBC: 0 % (ref 0.0–0.2)

## 2019-04-04 LAB — COMPREHENSIVE METABOLIC PANEL
ALT: 15 U/L (ref 0–44)
AST: 17 U/L (ref 15–41)
Albumin: 2.3 g/dL — ABNORMAL LOW (ref 3.5–5.0)
Alkaline Phosphatase: 83 U/L (ref 38–126)
Anion gap: 10 (ref 5–15)
BUN: 20 mg/dL (ref 8–23)
CO2: 22 mmol/L (ref 22–32)
Calcium: 8.4 mg/dL — ABNORMAL LOW (ref 8.9–10.3)
Chloride: 103 mmol/L (ref 98–111)
Creatinine, Ser: 5.14 mg/dL — ABNORMAL HIGH (ref 0.44–1.00)
GFR calc Af Amer: 9 mL/min — ABNORMAL LOW (ref >60)
GFR calc non Af Amer: 8 mL/min — ABNORMAL LOW (ref >60)
Glucose, Bld: 82 mg/dL (ref 70–99)
Potassium: 4.5 mmol/L (ref 3.5–5.1)
Sodium: 135 mmol/L (ref 135–145)
Total Bilirubin: 0.7 mg/dL (ref 0.3–1.2)
Total Protein: 5 g/dL — ABNORMAL LOW (ref 6.5–8.1)

## 2019-04-04 LAB — SARS CORONAVIRUS 2 (TAT 6-24 HRS): SARS Coronavirus 2: NEGATIVE

## 2019-04-04 LAB — MRSA PCR SCREENING: MRSA by PCR: NEGATIVE

## 2019-04-04 MED ORDER — SODIUM CHLORIDE 0.9 % IV SOLN: 250.0000 mL | INTRAVENOUS | Status: DC | PRN

## 2019-04-04 MED ORDER — ONDANSETRON HCL 4 MG/2ML IJ SOLN: 4.0000 mg | Freq: Four times a day (QID) | INTRAMUSCULAR | Status: DC | PRN

## 2019-04-04 MED ORDER — ONDANSETRON HCL 4 MG PO TABS: 4.0000 mg | ORAL_TABLET | Freq: Four times a day (QID) | ORAL | Status: DC | PRN

## 2019-04-04 MED ORDER — SODIUM CHLORIDE 0.9% FLUSH: 3.0000 mL | INTRAVENOUS | Status: DC | PRN

## 2019-04-04 MED ORDER — PANTOPRAZOLE SODIUM 40 MG PO TBEC: 40.0000 mg | DELAYED_RELEASE_TABLET | Freq: Two times a day (BID) | ORAL | 1 refills | Status: DC

## 2019-04-04 MED ORDER — SODIUM CHLORIDE 0.9% FLUSH: 3.0000 mL | Freq: Two times a day (BID) | INTRAVENOUS | Status: DC

## 2019-04-04 NOTE — Progress Notes (Signed)
Order received to discharge patient.  Telemetry monitor removed and CCMD notified.  Femoral CVC removed per MD order.  PIV access removed per MD order.  Discharge instructions, follow up, medications and instructions for their use discussed with patient.

## 2019-04-04 NOTE — Progress Notes (Signed)
Patient blood pressure 183/82 text paged Dr. Shanon Brow.MD aware no new orders received.

## 2019-04-04 NOTE — H&P (Signed)
History and Physical    Christina [name]:[id number] DOB: [birth year] DOA: 04/04/2019  PCP: Maggie Schwalbe, PA-C  Patient coming from: [name]  Chief Complaint: Coffee-ground emesis  HPI: Christina [name] is a 69 y.o. female with medical history significant of recent GI bleed secondary to duodenitis versus gastritis was hospitalized the end of February for GI bleed with a hemoglobin of 5.1 had to be transfused 2 units of blood.  Patient presented to [facility] ED again with coffee-ground hematemesis hemoglobin was over 11 fecal occult blood was negative.  Patient denies any fevers.  Her symptoms have totally resolved she is not had any further abdominal pain or vomiting for 24 hours.  She was accepted to our service from [place] on 04/02/2019 at 11 PM so she has been asymptomatic for almost 24 hours.  Patient be referred and transferred here for GI bleed.  Review of Systems: As per HPI otherwise 10 point review of systems negative.   Past Medical History:  Diagnosis Date  . Anemia   . Anxiety   . Arthritis   . Asthma   . Atrial fibrillation ([place])   . Chronic kidney disease    Dialysis T/Th/Sa  started in March 2020  . COPD (chronic obstructive pulmonary disease) ([place])   . Coronary artery disease    2 stents  . Depression   . GERD (gastroesophageal reflux disease)   . Gout   . Headache    migraines  . History of kidney stones   . Hyperlipidemia   . Hypertension   . Pneumonia   . Small bowel obstruction [facility])     Past Surgical History:  Procedure Laterality Date  . ABDOMINAL HYSTERECTOMY    . ABDOMINAL SURGERY     for small bowel obstruction - x 2  . APPENDECTOMY    . AV FISTULA PLACEMENT Left 08/04/2018   Procedure: ARTERIOVENOUS (AV) FISTULA CREATION LEFT ARM;  Surgeon: Waynetta Sandy, MD;  Location: [place];  Service: Vascular;  Laterality: Left;  . BASCILIC VEIN TRANSPOSITION Left 11/24/2018   Procedure: SECOND STAGE BASILIC VEIN TRANSPOSITION LEFT ARM;   Surgeon: Waynetta Sandy, MD;  Location: [place];  Service: Vascular;  Laterality: Left;  . BIOPSY  03/26/2019   Procedure: BIOPSY;  Surgeon: Lavena Bullion, DO;  Location: [place];  Service: Gastroenterology;;  . CARDIAC CATHETERIZATION    . CORONARY ANGIOPLASTY  ?2003/2004  . ESOPHAGOGASTRODUODENOSCOPY (EGD) WITH PROPOFOL N/A 03/26/2019   Procedure: ESOPHAGOGASTRODUODENOSCOPY (EGD) WITH PROPOFOL;  Surgeon: Lavena Bullion, DO;  Location: [place];  Service: Gastroenterology;  Laterality: N/A;  . FACIAL RECONSTRUCTION SURGERY     x 2  . HERNIA REPAIR       reports that she quit smoking about 2 years ago. She has a 25.00 pack-year smoking history. She has never used smokeless tobacco. She reports previous alcohol use. She reports previous drug use. Drug: Marijuana.  Allergies  Allergen Reactions  . Cyclobenzaprine Anaphylaxis and Other (See Comments)    "stopped heart"   . Morphine Anaphylaxis    "stopped heart"  . Penicillins Shortness Of Breath, Swelling and Palpitations    Did it involve swelling of the face/tongue/throat, SOB, or low BP? Yes Did it involve sudden or severe rash/hives, skin peeling, or any reaction on the inside of your mouth or nose? No Did you need to seek medical attention at a hospital or doctor's office? No When did it last happen?years  If all above answers are "NO", may proceed  with cephalosporin use.   . Ambien [Zolpidem] Other (See Comments)    Severe confusion  . Codeine Itching and Rash  . Hydromorphone Other (See Comments)    If administered quickly, felt like hand was "exploding"     No family history on file.  No premature coronary artery disease  Prior to Admission medications   Medication Sig Start Date End Date Taking? Authorizing Provider  albuterol (PROVENTIL) (2.5 MG/3ML) 0.083% nebulizer solution Take 2.5 mg by nebulization every 6 (six) hours as needed for wheezing or shortness of breath.    [provider]  allopurinol (ZYLOPRIM) 100 MG tablet Take 100 mg by mouth daily. 02/18/18   [provider]  amLODipine (NORVASC) 10 MG tablet Take 10 mg by mouth daily.    [provider]  budesonide-formoterol (SYMBICORT) 160-4.5 MCG/ACT inhaler Inhale 2 puffs into the lungs every 4 (four) hours as needed (shortness of breath/wheezing).    [provider]  Carboxymethylcellul-Glycerin (LUBRICATING EYE DROPS OP) Place 1 drop into both eyes daily as needed (dry eyes).    [provider]  Cholecalciferol (VITAMIN D3) 50 MCG (2000 UT) capsule Take 2,000 Units by mouth daily.     [provider]  dicyclomine (BENTYL) 10 MG capsule Take 20 mg by mouth at bedtime.  08/27/17   [provider]  docusate sodium (COLACE) 100 MG capsule Take 1 capsule (100 mg total) by mouth 2 (two) times daily. 08/11/18   Hongalgi, Lenis Dickinson, MD  doxylamine, Sleep, (UNISOM) 25 MG tablet Take 25 mg by mouth at bedtime as needed for sleep.    [provider]  ferric citrate (AURYXIA) 1 GM 210 MG(Fe) tablet Take 420-630 mg by mouth See admin instructions. Take 3 tablets (630 mg) by mouth three times daily with meals and 2 tablets (420 mg) with snacks    [provider]  gabapentin (NEURONTIN) 300 MG capsule Take 1 capsule (300 mg total) by mouth at bedtime. 02/23/19   Mercy Riding, MD  Ipratropium-Albuterol (COMBIVENT) 20-100 MCG/ACT AERS respimat Inhale 2 puffs into the lungs 4 (four) times daily as needed for wheezing. 09/11/17   [provider]  isosorbide mononitrate (IMDUR) 30 MG 24 hr tablet Take 30 mg by mouth daily. 08/17/18   [provider]  lidocaine-prilocaine (EMLA) cream Apply 1 application topically daily as needed (1-2 hours before dialysis (cover with occlusive dressing)).  09/03/18   [provider]  metoprolol tartrate (LOPRESSOR) 100 MG tablet Take 1 tablet (100 mg total) by mouth 2 (two) times daily. 01/26/19   Little Ishikawa, MD  nitroGLYCERIN (NITROSTAT) 0.4 MG SL tablet Place 0.4 mg under the tongue every 5 (five) minutes as needed for chest pain.  09/18/16   [provider]  ondansetron (ZOFRAN) 4 MG tablet Take 4 mg by mouth 3 (three) times daily as needed for nausea. 09/14/18   [provider]  pantoprazole (PROTONIX) 40 MG tablet Take 1 tablet (40 mg total) by mouth daily. 03/30/19   Mercy Riding, MD  polyethylene glycol (MIRALAX / GLYCOLAX) 17 g packet Take 17 g by mouth daily as needed for mild constipation. 03/30/19   Mercy Riding, MD  pravastatin (PRAVACHOL) 80 MG tablet Take 80 mg by mouth every evening.  06/30/16   [provider]  sennosides-docusate sodium (SENOKOT-S) 8.6-50 MG tablet Take 1 tablet by mouth 2 (two) times daily.     [provider]  sucralfate (CARAFATE) 1 GM/10ML suspension Take 10  mLs (1 g total) by mouth every 6 (six) hours. 03/30/19 05/29/19  Mercy Riding, MD    Physical Exam: Vitals:   04/04/19 0343  BP: (!) 183/82  Pulse: 81  Resp: (!) 30  Temp: 98.5 F (36.9 C)  TempSrc: Oral  SpO2: 99%  Weight: 47.1 kg  Height: 5\' 2"  (1.575 m)      Constitutional: NAD, calm, comfortable Vitals:   04/04/19 0343  BP: (!) 183/82  Pulse: 81  Resp: (!) 30  Temp: 98.5 F (36.9 C)  TempSrc: Oral  SpO2: 99%  Weight: 47.1 kg  Height: 5\' 2"  (1.575 m)   Eyes: PERRL, lids and conjunctivae normal ENMT: Mucous membranes are moist. Posterior pharynx clear of any exudate or lesions.Normal dentition.  Neck: normal, supple, no masses, no thyromegaly Respiratory: clear to auscultation bilaterally, no wheezing, no crackles. Normal respiratory effort. No accessory muscle use.  Cardiovascular: Regular rate and rhythm, no murmurs / rubs / gallops. No extremity edema. 2+ pedal pulses. No carotid bruits.  Abdomen: no tenderness, no masses palpated. No hepatosplenomegaly. Bowel sounds positive.  Musculoskeletal: no clubbing / cyanosis. No joint deformity upper  and lower extremities. Good ROM, no contractures. Normal muscle tone.  Skin: no rashes, lesions, ulcers. No induration Neurologic: CN 2-12 grossly intact. Sensation intact, DTR normal. Strength 5/5 in all 4.  Psychiatric: Normal judgment and insight. Alert and oriented x 3. Normal mood.    Labs on Admission: I have personally reviewed following labs and imaging studies  CBC: Recent Labs  Lab 03/29/19 0138 03/30/19 0227  WBC 11.1* 8.2  NEUTROABS 8.9* 6.5  HGB 9.1* 8.9*  HCT 28.4* 27.8*  MCV 96.6 96.9  PLT 196 299   Basic Metabolic Panel: Recent Labs  Lab 03/29/19 0138 03/30/19 0227  NA 126* 133*  K 3.9 3.9  CL 93* 99  CO2 22 26  GLUCOSE 82 102*  BUN 24* 7*  CREATININE 4.63* 2.54*  CALCIUM 7.8* 7.7*   GFR: Estimated Creatinine Clearance: 15.8 mL/min (A) (by C-G formula based on SCr of 2.54 mg/dL (H)). Liver Function Tests: No results for input(s): AST, ALT, ALKPHOS, BILITOT, PROT, ALBUMIN in the last 168 hours. No results for input(s): LIPASE, AMYLASE in the last 168 hours. No results for input(s): AMMONIA in the last 168 hours. Coagulation Profile: No results for input(s): INR, PROTIME in the last 168 hours. Cardiac Enzymes: No results for input(s): CKTOTAL, CKMB, CKMBINDEX, TROPONINI in the last 168 hours. BNP (last 3 results) No results for input(s): PROBNP in the last 8760 hours. HbA1C: No results for input(s): HGBA1C in the last 72 hours. CBG: No results for input(s): GLUCAP in the last 168 hours. Lipid Profile: No results for input(s): CHOL, HDL, LDLCALC, TRIG, CHOLHDL, LDLDIRECT in the last 72 hours. Thyroid Function Tests: No results for input(s): TSH, T4TOTAL, FREET4, T3FREE, THYROIDAB in the last 72 hours. Anemia Panel: No results for input(s): VITAMINB12, FOLATE, FERRITIN, TIBC, IRON, RETICCTPCT in the last 72 hours. Urine analysis:    Component Value Date/Time   COLORURINE YELLOW 03/25/2019 1009   APPEARANCEUR CLEAR 03/25/2019 1009   LABSPEC  1.013 03/25/2019 1009   PHURINE 7.0 03/25/2019 1009   GLUCOSEU 50 (A) 03/25/2019 1009   HGBUR NEGATIVE 03/25/2019 1009   BILIRUBINUR NEGATIVE 03/25/2019 1009   [place] 03/25/2019 1009   PROTEINUR 100 (A) 03/25/2019 1009   NITRITE NEGATIVE 03/25/2019 1009   LEUKOCYTESUR NEGATIVE 03/25/2019 1009   Sepsis Labs: !!!!!!!!!!!!!!!!!!!!!!!!!!!!!!!!!!!!!!!!!!!! @LABRCNTIP (procalcitonin:4,lacticidven:4) ) Recent Results (from the past 240 hour(s))  SARS CORONAVIRUS 2 (TAT 6-24 HRS) Nasopharyngeal Nasopharyngeal Swab     Status: None   Collection Time: 03/25/19 10:26 AM   Specimen: Nasopharyngeal Swab  Result Value Ref Range Status   SARS Coronavirus 2 NEGATIVE NEGATIVE Final    Comment: (NOTE) SARS-CoV-2 target nucleic acids are NOT DETECTED. The SARS-CoV-2 RNA is generally detectable in upper and lower respiratory specimens during the acute phase of infection. Negative results do not preclude SARS-CoV-2 infection, do not rule out co-infections with other pathogens, and should not be used as the sole basis for treatment or other patient management decisions. Negative results must be combined with clinical observations, patient history, and epidemiological information. The expected result is Negative. Fact Sheet for Patients: SugarRoll.be Fact Sheet for Healthcare Providers: https://www.woods-mathews.com/ This test is not yet approved or cleared by the [place] FDA and  has been authorized for detection and/or diagnosis of SARS-CoV-2 by FDA under an Emergency Use Authorization (EUA). This EUA will remain  in effect (meaning this test can be used) for the duration of the COVID-19 declaration under Section 56 4(b)(1) of the Act, 21 U.S.C. section 360bbb-3(b)(1), unless the authorization is terminated or revoked sooner. Performed at [facility], [place] [address] [place], [place] [address]      Radiological Exams on  Admission: No results found.  Old chart reviewed here Old chart reviewed from [place]  Assessment/Plan 69 year old female with coffee-ground emesis with recent work-up at [facility] found to have peptic ulcer disease and esophagitis Principal Problem:   Gastritis and gastroduodenitis-check stat CBC now.  If hemoglobin stable can discharge home later today.  Continue PPI.  Abdominal exam benign.  Symptoms have totally resolved.  Active Problems:   Bipolar affective (HCC)-stable continue home meds   COPD with chronic bronchitis (HCC)-stable continue home meds   AF (paroxysmal atrial fibrillation) (HCC) -currently rate controlled   Duodenal ulcer-PPI as above    DVT prophylaxis: SCDs Code Status: Full Family Communication: None Disposition Plan: Later today Consults called: None Admission status: Observation   Lorenza Shakir A MD [facility]  If 7PM-7AM, please contact night-coverage [email or web address] Password [facility]  04/04/2019, 4:12 AM

## 2019-04-04 NOTE — Discharge Summary (Signed)
Physician Discharge Summary  Christina [name] PPI:[id number] DOB: 08/10/1950 DOA: 04/04/2019  PCP: Maggie Schwalbe, PA-C  Admit date: 04/04/2019 Discharge date: 04/04/2019  Admitted From: Home Disposition:  Home  Discharge Condition:Stable CODE STATUS:FULL Diet recommendation: Soft for next 2 days then heart healthy   Brief/Interim Summary: HPI: Christina [name] is a 69 y.o. female with medical history significant of recent GI bleed secondary to duodenitis versus gastritis was hospitalized the end of February for GI bleed with a hemoglobin of 5.1 had to be transfused 2 units of blood.  Patient presented to [facility] ED again with coffee-ground hematemesis hemoglobin was over 11 fecal occult blood was negative.  Patient denies any fevers.  Her symptoms have totally resolved she is not had any further abdominal pain or vomiting for 24 hours.  She was accepted to our service from [place] on 04/02/2019 at 11 PM so she has been asymptomatic for almost 24 hours.  Patient be referred and transferred here for GI bleed.  Hospital Course: Patient's hospital course remained stable. She did not have any bloody bowel movements after admission. Her hemoglobin has remained stable in the range of 9. FOBT was negative. This morning she had a bowel movement which was brown in color. Patient remains hemodynamically stable and medically ready for discharge today. We have recommended her to follow-up with her PCP in a week and do a CBC test to check hemoglobin. She has an appointment with gastroenterology in the month of March. Would recommend to take PPI twice a day and continue Carafate. I called the daughter and discussed about the discharge plan.  Discharge Diagnoses:  Principal Problem:   Gastritis and gastroduodenitis Active Problems:   Bipolar affective ([place])   COPD with chronic bronchitis ([place])   Anemia due to chronic kidney disease, on chronic dialysis (HCC)   AF (paroxysmal atrial fibrillation)  (HCC)   Duodenal ulcer   Hematemesis    Discharge Instructions  Discharge Instructions    Diet - low sodium heart healthy   Complete by: As directed    Discharge instructions   Complete by: As directed    1)Please take prescribed medications as instructed. 2)Follow up with your PCP in a week. Do a CBC test during the follow-up to check your hemoglobin. 3)Take soft diet for next 2 days then start taking solid food. 4)Follow up with gastroenterology in the next available appointment.   Increase activity slowly   Complete by: As directed      Allergies as of 04/04/2019      Reactions   Cyclobenzaprine Anaphylaxis, Other (See Comments)   "stopped heart"   Morphine Anaphylaxis   "stopped heart"   Penicillins Shortness Of Breath, Swelling, Palpitations   Did it involve swelling of the face/tongue/throat, SOB, or low BP? Yes Did it involve sudden or severe rash/hives, skin peeling, or any reaction on the inside of your mouth or nose? No Did you need to seek medical attention at a hospital or doctor's office? No When did it last happen?years  If all above answers are "NO", may proceed with cephalosporin use.   Ambien [zolpidem] Other (See Comments)   Severe confusion   Codeine Itching, Rash   Hydromorphone Other (See Comments)   If administered quickly, felt like hand was "exploding"       Medication List    TAKE these medications   albuterol (2.5 MG/3ML) 0.083% nebulizer solution Commonly known as: PROVENTIL Take 2.5 mg by nebulization every 6 (six) hours as needed for  wheezing or shortness of breath.   allopurinol 100 MG tablet Commonly known as: ZYLOPRIM Take 100 mg by mouth daily.   amLODipine 10 MG tablet Commonly known as: NORVASC Take 10 mg by mouth daily.   Auryxia 1 GM 210 MG(Fe) tablet Generic drug: ferric citrate Take 420-630 mg by mouth See admin instructions. Take 3 tablets (630 mg) by mouth three times daily with meals and 2 tablets (420 mg) with  snacks   budesonide-formoterol 160-4.5 MCG/ACT inhaler Commonly known as: SYMBICORT Inhale 2 puffs into the lungs every 4 (four) hours as needed (shortness of breath/wheezing).   dicyclomine 10 MG capsule Commonly known as: BENTYL Take 20 mg by mouth at bedtime.   docusate sodium 100 MG capsule Commonly known as: COLACE Take 1 capsule (100 mg total) by mouth 2 (two) times daily.   doxylamine (Sleep) 25 MG tablet Commonly known as: UNISOM Take 25 mg by mouth at bedtime as needed for sleep.   gabapentin 300 MG capsule Commonly known as: NEURONTIN Take 1 capsule (300 mg total) by mouth at bedtime.   Ipratropium-Albuterol 20-100 MCG/ACT Aers respimat Commonly known as: COMBIVENT Inhale 2 puffs into the lungs 4 (four) times daily as needed for wheezing.   isosorbide mononitrate 30 MG 24 hr tablet Commonly known as: IMDUR Take 30 mg by mouth daily.   lidocaine-prilocaine cream Commonly known as: EMLA Apply 1 application topically daily as needed (1-2 hours before dialysis (cover with occlusive dressing)).   LUBRICATING EYE DROPS OP Place 1 drop into both eyes daily as needed (dry eyes).   metoprolol tartrate 100 MG tablet Commonly known as: LOPRESSOR Take 1 tablet (100 mg total) by mouth 2 (two) times daily.   nitroGLYCERIN 0.4 MG SL tablet Commonly known as: NITROSTAT Place 0.4 mg under the tongue every 5 (five) minutes as needed for chest pain.   ondansetron 4 MG tablet Commonly known as: ZOFRAN Take 4 mg by mouth 3 (three) times daily as needed for nausea.   pantoprazole 40 MG tablet Commonly known as: PROTONIX Take 1 tablet (40 mg total) by mouth 2 (two) times daily. What changed: when to take this   polyethylene glycol 17 g packet Commonly known as: MIRALAX / GLYCOLAX Take 17 g by mouth daily as needed for mild constipation.   pravastatin 80 MG tablet Commonly known as: PRAVACHOL Take 80 mg by mouth every evening.   sennosides-docusate sodium 8.6-50 MG  tablet Commonly known as: SENOKOT-S Take 1 tablet by mouth 2 (two) times daily.   sucralfate 1 GM/10ML suspension Commonly known as: CARAFATE Take 10 mLs (1 g total) by mouth every 6 (six) hours.   Vitamin D3 50 MCG (2000 UT) capsule Take 2,000 Units by mouth daily.      Follow-up Information    Nodal, [name], PA-C. Schedule an appointment as soon as possible for a visit in 1 week(s).   Specialty: Physician Assistant Contact information: [place] [id number] [phone]          Allergies  Allergen Reactions  . Cyclobenzaprine Anaphylaxis and Other (See Comments)    "stopped heart"   . Morphine Anaphylaxis    "stopped heart"  . Penicillins Shortness Of Breath, Swelling and Palpitations    Did it involve swelling of the face/tongue/throat, SOB, or low BP? Yes Did it involve sudden or severe rash/hives, skin peeling, or any reaction on the inside of your mouth or nose? No Did you need to seek medical attention at  a hospital or doctor's office? No When did it last happen?years  If all above answers are "NO", may proceed with cephalosporin use.   . Ambien [Zolpidem] Other (See Comments)    Severe confusion  . Codeine Itching and Rash  . Hydromorphone Other (See Comments)    If administered quickly, felt like hand was "exploding"     Consultations:  None   Procedures/Studies: NM Hepatobiliary Liver Func  Result Date: 03/21/2019 CLINICAL DATA:  Upper abdominal and chest pain approximately 4 days. EXAM: NUCLEAR MEDICINE HEPATOBILIARY IMAGING WITH GALLBLADDER EF TECHNIQUE: Sequential images of the abdomen were obtained out to 60 minutes following intravenous administration of radiopharmaceutical. After oral ingestion of Ensure, gallbladder ejection fraction was determined. At 60 min, normal ejection fraction is greater than 33%. RADIOPHARMACEUTICALS:  4.78 mCi Tc-22m  Choletec IV COMPARISON:  Right upper quadrant ultrasound  03/19/2019. FINDINGS: Prompt uptake and biliary excretion of activity by the liver is seen. Gallbladder activity is visualized, consistent with patency of cystic duct. Biliary activity passes into small bowel, consistent with patent common bile duct. Calculated gallbladder ejection fraction is 67%. (Normal gallbladder ejection fraction with Ensure is greater than 33%.) IMPRESSION: Normal exam. Electronically Signed   By: Inge Rise M.D.   On: 03/21/2019 12:11   DG CHEST PORT 1 VIEW  Result Date: 03/26/2019 CLINICAL DATA:  Acute respiratory failure. EXAM: PORTABLE CHEST 1 VIEW COMPARISON:  03/25/2019 FINDINGS: 0609 hours. Lungs are hyperexpanded. The cardio pericardial silhouette is enlarged. Interstitial markings are diffusely coarsened with chronic features. The lungs are clear without focal pneumonia, edema, pneumothorax or pleural effusion. Prominent skin fold over the left hemithorax. Right IJ central line tip overlies the SVC/RA junction possibly just into the upper right atrium. The visualized bony structures of the thorax are intact. Telemetry leads overlie the chest. IMPRESSION: Hyperexpansion with chronic interstitial coarsening. No acute cardiopulmonary findings. Electronically Signed   By: Misty Stanley M.D.   On: 03/26/2019 08:31   DG Chest Port 1 View  Result Date: 03/25/2019 CLINICAL DATA:  Weakness with abdominal pain. EXAM: PORTABLE CHEST 1 VIEW COMPARISON:  03/19/2019 FINDINGS: 7:50 a.m. Cardiopericardial silhouette is at upper limits of normal for size. The lungs are clear without focal pneumonia, edema, pneumothorax or pleural effusion. Interstitial markings are diffusely coarsened with chronic features. Right-sided central line tip is stable, positioned over the upper right atrium. The visualized bony structures of the thorax are intact. Telemetry leads overlie the chest. IMPRESSION: Stable.  No acute findings. Electronically Signed   By: Misty Stanley M.D.   On: 03/25/2019 08:26    DG Chest Port 1 View  Result Date: 03/19/2019 CLINICAL DATA:  Chest pain since last night with shortness of breath. Missed dialysis today and this past Tuesday. EXAM: PORTABLE CHEST 1 VIEW COMPARISON:  03/09/2019 FINDINGS: Right IJ dialysis catheter unchanged. Lungs are hyperexpanded without lobar consolidation or effusion. Subtle hazy prominence of the central pulmonary vessels likely minimal vascular congestion. Cardiomediastinal silhouette and remainder of the exam is unchanged. IMPRESSION: Suggestion of minimal vascular congestion. Electronically Signed   By: Marin Olp M.D.   On: 03/19/2019 13:41   DG Abd Portable 1 View  Result Date: 03/18/2019 CLINICAL DATA:  Question small bowel obstruction. Clinical notes states abdominal pain. Vomiting. EXAM: PORTABLE ABDOMEN - 1 VIEW COMPARISON:  CT 03/09/2019 at [facility] , 9 days prior. Patient is also had abdominal CT 03/06/2019, 02/27/2019, and 02/21/2019. FINDINGS: There is contrast within the descending and sigmoid colon from prior radiologic exam. Small  volume of stool in the ascending colon. Air-filled prominent but nondilated small bowel in the left abdomen. No abnormal gastric distension. Vascular calcifications. Renal calcifications on CT not well visualized radiographically. No evidence of free air. No acute osseous abnormalities are seen. IMPRESSION: No evidence of small-bowel obstruction or acute findings. There is enteric contrast within the descending and sigmoid colon from prior radiologic exam. Patient has had 4 abdominal CTs in the last 4 weeks, most recent CT was 03/09/2019 at [facility]. Electronically Signed   By: Keith Rake M.D.   On: 03/18/2019 03:07   CT Angio Chest/Abd/Pel for Dissection W and/or Wo Contrast  Addendum Date: 03/25/2019   ADDENDUM REPORT: 03/25/2019 09:28 ADDENDUM: These results were called by telephone at the time of interpretation on 03/25/2019 at 9:28 am to provider [facility] RAY , who  verbally acknowledged these results. Electronically Signed   By: Zetta Bills M.D.   On: 03/25/2019 09:28   Result Date: 03/25/2019 CLINICAL DATA:  Abdominal pain, acute with nausea and vomiting for 1 week. EXAM: CT ANGIOGRAPHY CHEST, ABDOMEN AND PELVIS TECHNIQUE: Multidetector CT imaging through the chest, abdomen and pelvis was performed using the standard protocol during bolus administration of intravenous contrast. Multiplanar reconstructed images and MIPs were obtained and reviewed to evaluate the vascular anatomy. CONTRAST:  35mL OMNIPAQUE IOHEXOL 350 MG/ML SOLN COMPARISON:  03/19/2019 and multiple prior studies. FINDINGS: CTA CHEST FINDINGS Cardiovascular: No signs of intramural hematoma within the aorta on noncontrast imaging. Low-attenuation cardiac blood pool compatible with anemia. Right sided catheter with small associated thrombus or fibrin sheath (image 39, series 8) findings are similar to the previous exam with low-attenuation crescent around the catheter in the proximal SVC. Abundant atherosclerotic plaque of the thoracic aorta both calcified but predominantly noncalcified without interval change with multiple areas of plaque irregularity but without signs of dissection. Patent great vessels in the chest. Heart size remains enlarged with extensive coronary artery disease, similar to the prior study. No signs of pericardial effusion. Mediastinum/Nodes: Scattered lymph nodes about the mediastinum without change. No axillary lymphadenopathy. No thoracic inlet lymphadenopathy. Lungs/Pleura: No signs of consolidation or evidence of pleural effusion. Airways are patent. Signs of pulmonary emphysema with centrilobular predominance as noted on the previous study. Airways are patent. Scattered 2-3 mm pulmonary nodules with similar appearance most notably in the right upper lobe. Musculoskeletal: No signs of acute bone finding or evidence of destructive bone process. No chest wall mass. See below for for  full detail. Review of the MIP images confirms the above findings. CTA ABDOMEN AND PELVIS FINDINGS VASCULAR Aorta: Abundant plaque in the abdominal aorta becoming less severe in the distal aorta. Caliber at the aortic hiatus 2.5 x 2.3 cm. Just below the renal arteries 2.5 by 2.4 cm. Distal aorta just above the iliac bifurcation 2.4 x 2.2 cm. No signs of dissection in the abdominal aorta. 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 Collateral pathways arising from the SMA. Vessels in the celiac distribution remain attenuated but patent. SMA: SMA remains attenuated/narrowed with small lumen approximately 1 cm from its origin. Lumen between 2 and 3 mm at this level where as the origin with lumen approximately 5 mm, 7 mm presumed poststenotic dilation just beyond the area of narrowing as before. This shows patency distally but is again supplying the celiac distribution as well. No change since the prior study in this appearance. Renals: Small renal arteries bilaterally with attenuated vessel on the left supplying  a scarred left kidney. Right renal artery with abundant plaque at the origin, vessel attenuated distally as before. IMA: IMA is not visualized. Distal aspect of the vessel with some branches that may show minimal opacification suggest collateral pathways. Inflow: Patent without evidence of aneurysm, dissection, vasculitis or significant stenosis. Veins: Limited assessment of venous structures on arterial phase. Review of the MIP images confirms the above findings. NON-VASCULAR Hepatobiliary: Liver is unremarkable. No signs of pericholecystic stranding. Gallstones layer in the gallbladder. No biliary ductal dilation. Pancreas: Pancreas unremarkable, no signs of peripancreatic inflammation or ductal dilation. Spleen: Spleen is normal. Adrenals/Urinary Tract: Adrenal masses bilaterally, on the left measuring 2.7 x 2.3 cm, on the right measuring  2.6 x 1.4 cm as compared to 2.5 x 1.9 cm. Lesions are similar over time accounting for differences in measurement technique dating back to July of 2020. The right adrenal mass is hyperenhancing on arterial phase more so than the left. Cortical scarring is evident bilaterally with renal vascular calcifications and presumed cysts, also not changed. Urinary bladder is unremarkable. No signs of hydronephrosis. Stomach/Bowel: Under distension versus gastric thickening. Moderate-sized duodenal diverticulum. No signs of bowel obstruction. Post small bowel resection in the left hemiabdomen. Colon is partially fluid-filled. Lymphatic: No signs of abdominal adenopathy. No signs of pelvic lymphadenopathy. Reproductive: Post hysterectomy. Other: No free air. No significant hernia or ascites. Musculoskeletal: No acute bone finding or destructive bone process. Review of the MIP images confirms the above findings. IMPRESSION: CTA CHEST 1. No evidence of acute intramural hematoma or dissection involving the thoracic aorta. 2. Abundant atherosclerotic plaque of the thoracic aorta both calcified but predominantly noncalcified. 3. Cardiomegaly with extensive coronary artery disease. 4. Fibrin sheath or small amount of adherent thrombus along the right IJ catheter in the proximal SVC. 5. Tiny pulmonary nodules are unchanged. CTA ABDOMEN AND PELVIS 1. 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 Also with probable occlusion of the IMA origin. Given this configuration the patient is at high risk for mesenteric ischemia and or mesenteric angina. No overt signs of ischemia on today's scan. Lactate correlation may be helpful. 2. No signs of bowel obstruction. 3. Bilateral adrenal masses. The left having a more benign appearance. Hyperenhancement and baseline hyperdensity of the right adrenal mass despite stability could be indicative of pheochromocytoma.  Biochemical correlation may be helpful if not yet performed. 4. Cholelithiasis as before. 5. Under distension versus gastric thickening correlate with any symptoms of gastritis. 6. Emphysema and aortic atherosclerosis. 7. Lucency of distal humerus may be related to artifact on the scout view. Correlate with any pain in this area and consider dedicated plain film assessment as warranted. 8. Call is out to the referring provider to further discuss findings in the above case. Aortic Atherosclerosis (ICD10-I70.0) and Emphysema (ICD10-J43.9). Electronically Signed: By: Zetta Bills M.D. On: 03/25/2019 09:19   CT Angio Chest/Abd/Pel for Dissection W and/or Wo Contrast  Result Date: 03/19/2019 CLINICAL DATA:  69 year old female with history of chest and back pain. Suspected aortic dissection. EXAM: CT ANGIOGRAPHY CHEST, ABDOMEN AND PELVIS TECHNIQUE: Multidetector CT imaging through the chest, abdomen and pelvis was performed using the standard protocol during bolus administration of intravenous contrast. Multiplanar reconstructed images and MIPs were obtained and reviewed to evaluate the vascular anatomy. CONTRAST:  51mL OMNIPAQUE IOHEXOL 350 MG/ML SOLN COMPARISON:  CT the abdomen and pelvis 03/09/2019. CT the abdomen and pelvis 02/27/2019. Chest CT 04/05/2018. FINDINGS: CTA CHEST FINDINGS Cardiovascular:  Noncontrast images demonstrate no crescentic high attenuation associated with the thoracic aorta to suggest acute intramural hematoma. Postcontrast images demonstrate no definite aortic dissection. There is extensive irregular atheromatous plaque throughout the thoracic aorta with multiple areas of ulceration, but no frank dissection or penetration. Heart size is normal. There is no significant pericardial fluid, thickening or pericardial calcification. There is aortic atherosclerosis, as well as atherosclerosis of the great vessels of the mediastinum and the coronary arteries, including calcified atherosclerotic  plaque in the left main, left anterior descending, left circumflex and right coronary arteries. Calcifications of the mitral annulus. Right internal jugular PermCath with tip terminating in the right atrium. Mediastinum/Nodes: No pathologically enlarged mediastinal or hilar lymph nodes. Esophagus is unremarkable in appearance. No axillary lymphadenopathy. Lungs/Pleura: A few scattered tiny 2-3 mm pulmonary nodules are noted throughout the lungs bilaterally. No other larger more suspicious appearing pulmonary nodules or masses are noted. Mild diffuse bronchial wall thickening with mild centrilobular and paraseptal emphysema. Linear scarring or subsegmental atelectasis in the dependent portion of the left lower lobe. Musculoskeletal: There are no aggressive appearing lytic or blastic lesions noted in the visualized portions of the skeleton. Review of the MIP images confirms the above findings. CTA ABDOMEN AND PELVIS FINDINGS VASCULAR Aorta: Normal caliber aorta without aneurysm, dissection, vasculitis or significant stenosis. However, there is extensive atheromatous plaque, much of which is highly irregular with several areas of ulcerative plaque throughout the abdominal aorta. Celiac: Ostium of the vessel is not confidently identified and is either severely stenotic or completely occluded. However, distal aspects of the vessel and its major branches are patent, potentially via collateral flow. SMA: Patent with mild stenosis proximally (2 mm) followed by poststenotic dilatation (7 mm). Renals: Both renal arteries are patent without evidence of aneurysm, dissection, vasculitis, fibromuscular dysplasia or significant stenosis. IMA: Ostium of the vessel is not confidently identified, potentially completely occluded. Distal aspect of the vessel is patent, presumably fed by collateral pathways. Inflow: Patent without evidence of aneurysm, dissection, vasculitis or significant stenosis. Veins: No obvious venous abnormality  within the limitations of this arterial phase study. Review of the MIP images confirms the above findings. NON-VASCULAR Hepatobiliary: No suspicious cystic or solid hepatic lesions. No intra or extrahepatic biliary ductal dilatation. Gallbladder wall appears thickened and edematous (up to 1 cm in thickness). Gallbladder is only mildly distended. No calcified gallstones identified. Pancreas: No pancreatic mass. No pancreatic ductal dilatation. No pancreatic or peripancreatic fluid collections or inflammatory changes. Spleen: Unremarkable. Adrenals/Urinary Tract: 2.8 x 2.4 cm low-attenuation (-2 HU) left adrenal nodule (axial image 155 of series 7), compatible with an adenoma. 2.5 x 1.9 cm right adrenal nodule which demonstrates avid enhancement (axial image 151 of series 7). Multiple calcifications associated with the renal hila bilaterally, favored to be vascular, although nonobstructive calculi are not excluded. Subcentimeter low-attenuation lesions in both kidneys, too small to characterize, but statistically likely to represent cysts. 1.5 cm low-attenuation lesion in the upper pole the left kidney, compatible with a simple cyst. No suspicious renal lesions. Mild bilateral renal atrophy. No hydroureteronephrosis. Urinary bladder is nearly completely decompressed around an indwelling Foley balloon catheter. Stomach/Bowel: Normal appearance of the stomach. No pathologic dilatation of small bowel or colon. The appendix is not confidently identified and may be surgically absent. Regardless, there are no inflammatory changes noted adjacent to the cecum to suggest the presence of an acute appendicitis at this time. Lymphatic: No lymphadenopathy noted in the abdomen or pelvis. Reproductive: Status post hysterectomy. Ovaries are not confidently  identified may be surgically absent or atrophic. Other: No significant volume of ascites.  No pneumoperitoneum. Musculoskeletal: There are no aggressive appearing lytic or blastic  lesions noted in the visualized portions of the skeleton. Review of the MIP images confirms the above findings. IMPRESSION: 1. 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 2. Gallbladder wall thickening and edema. No calcified gallstones. Gallbladder is only mildly distended. Overall, findings are equivocal for acute cholecystitis, but further clinical evaluation is recommended. If there is clinical concern for acute cholecystitis, further evaluation with right upper quadrant ultrasound would be recommended. 3. 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 4. Bilateral adrenal nodules, similar to prior studies. The left adrenal nodule is compatible with a benign adenoma. The avidly enhancing right adrenal nodule is incompletely characterized, but appears relatively stable in size compared to numerous prior examinations, suggesting a benign lesion. 5. There is also left main and 3 vessel coronary artery disease. Please note that although the presence of coronary artery calcium documents the presence of coronary artery disease, the severity of this disease and any potential stenosis cannot be assessed on this non-gated CT examination. Assessment for potential risk factor modification, dietary therapy or pharmacologic therapy may be warranted, if clinically indicated. 6. Scattered tiny 2-3 mm pulmonary nodules noted in the lungs bilaterally, nonspecific, but statistically likely benign. No follow-up needed if patient is low-risk (and has no known or suspected primary neoplasm). Non-contrast chest CT can be considered in 12 months if patient is high-risk. This recommendation follows the consensus statement: Guidelines for Management of Incidental Pulmonary Nodules Detected  on CT Images: From the Fleischner Society 2017; Radiology 2017; 284:228-243. 7. Additional incidental findings, as above. Electronically Signed   By: Vinnie Langton M.D.   On: 03/19/2019 15:55   US Abdomen Limited RUQ  Result Date: 03/19/2019 CLINICAL DATA:  Right upper quadrant pain EXAM: ULTRASOUND ABDOMEN LIMITED RIGHT UPPER QUADRANT COMPARISON:  None. FINDINGS: Gallbladder: No gallstones. Positive sonographic Murphy sign noted by sonographer. Diffuse gallbladder wall thickening with small amount of pericholecystic fluid. Common bile duct: Diameter: 3 mm Liver: No focal lesion identified. Within normal limits in parenchymal echogenicity. Portal vein is patent on color Doppler imaging with normal direction of blood flow towards the liver. Other: None. IMPRESSION: Positive sonographic Murphy sign with gallbladder wall thickening and pericholecystic fluid, but no cholelithiasis. Electronically Signed   By: Ulyses Jarred M.D.   On: 03/19/2019 18:53       Subjective: Patient seen and examined at the bedside this morning. Hemodynamically stable for discharge.  Discharge Exam: Vitals:   04/04/19 0904 04/04/19 1000  BP: (!) 205/72   Pulse: (!) 110   Resp: (!) 23 18  Temp:    SpO2:     Vitals:   04/04/19 0800 04/04/19 0901 04/04/19 0904 04/04/19 1000  BP:   (!) 205/72   Pulse: 88  (!) 110   Resp: 18  (!) 23 18  Temp:  98.5 F (36.9 C)    TempSrc:  Oral    SpO2: 100%     Weight:      Height:        General: Pt is alert, awake, not in acute distress Cardiovascular: RRR, S1/S2 +, no rubs, no gallops Respiratory: CTA bilaterally, no wheezing, no rhonchi Abdominal: Soft, NT, ND, bowel  sounds + Extremities: no edema, no cyanosis    The results of significant diagnostics from this hospitalization (including imaging, microbiology, ancillary and laboratory) are listed below for reference.     Microbiology: Recent Results (from the past 240 hour(s))  MRSA PCR Screening     Status:  None   Collection Time: 04/04/19  4:11 AM   Specimen: Nasopharyngeal Wash  Result Value Ref Range Status   MRSA by PCR NEGATIVE NEGATIVE Final    Comment:        The GeneXpert MRSA Assay (FDA approved for NASAL specimens only), is one component of a comprehensive MRSA colonization surveillance program. It is not intended to diagnose MRSA infection nor to guide or monitor treatment for MRSA infections. Performed at [facility], [place] [address] [place], [place] [address]      Labs: BNP (last 3 results) Recent Labs    01/24/19 2228 02/04/19 0103 03/25/19 1313  BNP 1,570.0* 4,197.2* 1,194.1*   Basic Metabolic Panel: Recent Labs  Lab 03/29/19 0138 03/30/19 0227 04/04/19 0430  NA 126* 133* 135  K 3.9 3.9 4.5  CL 93* 99 103  CO2 22 26 22   GLUCOSE 82 102* 82  BUN 24* 7* 20  CREATININE 4.63* 2.54* 5.14*  CALCIUM 7.8* 7.7* 8.4*   Liver Function Tests: Recent Labs  Lab 04/04/19 0430  AST 17  ALT 15  ALKPHOS 83  BILITOT 0.7  PROT 5.0*  ALBUMIN 2.3*   No results for input(s): LIPASE, AMYLASE in the last 168 hours. No results for input(s): AMMONIA in the last 168 hours. CBC: Recent Labs  Lab 03/29/19 0138 03/30/19 0227 04/04/19 0430  WBC 11.1* 8.2 9.2  NEUTROABS 8.9* 6.5 6.7  HGB 9.1* 8.9* 9.1*  HCT 28.4* 27.8* 29.6*  MCV 96.6 96.9 98.0  PLT 196 184 240   Cardiac Enzymes: No results for input(s): CKTOTAL, CKMB, CKMBINDEX, TROPONINI in the last 168 hours. BNP: Invalid input(s): POCBNP CBG: No results for input(s): GLUCAP in the last 168 hours. D-Dimer No results for input(s): DDIMER in the last 72 hours. Hgb A1c No results for input(s): HGBA1C in the last 72 hours. Lipid Profile No results for input(s): CHOL, HDL, LDLCALC, TRIG, CHOLHDL, LDLDIRECT in the last 72 hours. Thyroid function studies No results for input(s): TSH, T4TOTAL, T3FREE, THYROIDAB in the last 72 hours.  Invalid input(s): FREET3 Anemia work up No results for input(s):  VITAMINB12, FOLATE, FERRITIN, TIBC, IRON, RETICCTPCT in the last 72 hours. Urinalysis    Component Value Date/Time   COLORURINE YELLOW 03/25/2019 1009   APPEARANCEUR CLEAR 03/25/2019 1009   LABSPEC 1.013 03/25/2019 1009   PHURINE 7.0 03/25/2019 1009   GLUCOSEU 50 (A) 03/25/2019 1009   HGBUR NEGATIVE 03/25/2019 1009   BILIRUBINUR NEGATIVE 03/25/2019 1009   Lydia 03/25/2019 1009   PROTEINUR 100 (A) 03/25/2019 1009   NITRITE NEGATIVE 03/25/2019 1009   LEUKOCYTESUR NEGATIVE 03/25/2019 1009   Sepsis Labs Invalid input(s): PROCALCITONIN,  WBC,  LACTICIDVEN Microbiology Recent Results (from the past 240 hour(s))  MRSA PCR Screening     Status: None   Collection Time: 04/04/19  4:11 AM   Specimen: Nasopharyngeal Wash  Result Value Ref Range Status   MRSA by PCR NEGATIVE NEGATIVE Final    Comment:        The GeneXpert MRSA Assay (FDA approved for NASAL specimens only), is one component of a comprehensive MRSA colonization surveillance program. It is not intended to diagnose MRSA infection nor to guide or monitor  treatment for MRSA infections. Performed at [facility], [place] [address] [place], [place] [address]     Please note: You were cared for by a hospitalist during your hospital stay. Once you are discharged, your primary care physician will handle any further medical issues. Please note that NO REFILLS for any discharge medications will be authorized once you are discharged, as it is imperative that you return to your primary care physician (or establish a relationship with a primary care physician if you do not have one) for your post hospital discharge needs so that they can reassess your need for medications and monitor your lab values.    Time coordinating discharge: 40 minutes  SIGNED:   Shelly Coss, MD  [facility] 04/04/2019, 10:43 AM Pager [phone]  If 7PM-7AM, please contact night-coverage [email or web address] Password [email or web address]

## 2019-04-04 NOTE — Progress Notes (Signed)
Patient arrived to unit [facility] bed 5 via carelink.Staff assisted patient to bed.Patient alert and oriented denies pain or discomfort at present time. Educated patient to nursing unit and call bell system. Educated patient to call for assistance prior to getting out of bed.Demonstrated call bell use and patient verbalized understanding.Bed alarm set for safety.Md Shanon Brow notified of patient arrival.

## 2019-04-12 DIAGNOSIS — K253 Acute gastric ulcer without hemorrhage or perforation: Secondary | ICD-10-CM

## 2019-04-23 ENCOUNTER — Emergency Department (HOSPITAL_COMMUNITY): Payer: Medicare Other

## 2019-04-23 ENCOUNTER — Inpatient Hospital Stay (HOSPITAL_COMMUNITY)
Admission: EM | Admit: 2019-04-23 | Discharge: 2019-04-29 | DRG: 356 | Disposition: A | Discharge status: Home / Self Care | Payer: Medicare Other | Source: Ambulatory Visit | Attending: Internal Medicine | Admitting: Internal Medicine

## 2019-04-23 ENCOUNTER — Encounter (HOSPITAL_COMMUNITY): Payer: Self-pay | Admitting: Internal Medicine

## 2019-04-23 DIAGNOSIS — K449 Diaphragmatic hernia without obstruction or gangrene: Secondary | ICD-10-CM | POA: Diagnosis present

## 2019-04-23 DIAGNOSIS — Z8489 Family history of other specified conditions: Secondary | ICD-10-CM

## 2019-04-23 DIAGNOSIS — K55069 Acute infarction of intestine, part and extent unspecified: Secondary | ICD-10-CM | POA: Diagnosis present

## 2019-04-23 DIAGNOSIS — F329 Major depressive disorder, single episode, unspecified: Secondary | ICD-10-CM | POA: Diagnosis present

## 2019-04-23 DIAGNOSIS — E441 Mild protein-calorie malnutrition: Secondary | ICD-10-CM | POA: Diagnosis present

## 2019-04-23 DIAGNOSIS — N2581 Secondary hyperparathyroidism of renal origin: Secondary | ICD-10-CM | POA: Diagnosis present

## 2019-04-23 DIAGNOSIS — Z87891 Personal history of nicotine dependence: Secondary | ICD-10-CM

## 2019-04-23 DIAGNOSIS — K264 Chronic or unspecified duodenal ulcer with hemorrhage: Secondary | ICD-10-CM | POA: Diagnosis present

## 2019-04-23 DIAGNOSIS — I480 Paroxysmal atrial fibrillation: Secondary | ICD-10-CM | POA: Diagnosis present

## 2019-04-23 DIAGNOSIS — Z955 Presence of coronary angioplasty implant and graft: Secondary | ICD-10-CM

## 2019-04-23 DIAGNOSIS — I700 Atherosclerosis of aorta: Secondary | ICD-10-CM | POA: Diagnosis present

## 2019-04-23 DIAGNOSIS — Z66 Do not resuscitate: Secondary | ICD-10-CM | POA: Diagnosis present

## 2019-04-23 DIAGNOSIS — Z992 Dependence on renal dialysis: Secondary | ICD-10-CM

## 2019-04-23 DIAGNOSIS — J449 Chronic obstructive pulmonary disease, unspecified: Secondary | ICD-10-CM | POA: Diagnosis present

## 2019-04-23 DIAGNOSIS — D3502 Benign neoplasm of left adrenal gland: Secondary | ICD-10-CM | POA: Diagnosis present

## 2019-04-23 DIAGNOSIS — K2100 Gastro-esophageal reflux disease with esophagitis, without bleeding: Secondary | ICD-10-CM | POA: Diagnosis present

## 2019-04-23 DIAGNOSIS — N186 End stage renal disease: Secondary | ICD-10-CM | POA: Diagnosis present

## 2019-04-23 DIAGNOSIS — I2510 Atherosclerotic heart disease of native coronary artery without angina pectoris: Secondary | ICD-10-CM | POA: Diagnosis present

## 2019-04-23 DIAGNOSIS — Z8673 Personal history of transient ischemic attack (TIA), and cerebral infarction without residual deficits: Secondary | ICD-10-CM

## 2019-04-23 DIAGNOSIS — K2970 Gastritis, unspecified, without bleeding: Secondary | ICD-10-CM | POA: Diagnosis present

## 2019-04-23 DIAGNOSIS — Z888 Allergy status to other drugs, medicaments and biological substances status: Secondary | ICD-10-CM

## 2019-04-23 DIAGNOSIS — R238 Other skin changes: Secondary | ICD-10-CM | POA: Diagnosis present

## 2019-04-23 DIAGNOSIS — Z880 Allergy status to penicillin: Secondary | ICD-10-CM

## 2019-04-23 DIAGNOSIS — I10 Essential (primary) hypertension: Secondary | ICD-10-CM | POA: Diagnosis present

## 2019-04-23 DIAGNOSIS — Z20822 Contact with and (suspected) exposure to covid-19: Secondary | ICD-10-CM | POA: Diagnosis present

## 2019-04-23 DIAGNOSIS — R1084 Generalized abdominal pain: Secondary | ICD-10-CM

## 2019-04-23 DIAGNOSIS — M109 Gout, unspecified: Secondary | ICD-10-CM | POA: Diagnosis present

## 2019-04-23 DIAGNOSIS — D3501 Benign neoplasm of right adrenal gland: Secondary | ICD-10-CM | POA: Diagnosis present

## 2019-04-23 DIAGNOSIS — F1410 Cocaine abuse, uncomplicated: Secondary | ICD-10-CM | POA: Diagnosis present

## 2019-04-23 DIAGNOSIS — K269 Duodenal ulcer, unspecified as acute or chronic, without hemorrhage or perforation: Secondary | ICD-10-CM

## 2019-04-23 DIAGNOSIS — I774 Celiac artery compression syndrome: Secondary | ICD-10-CM | POA: Diagnosis present

## 2019-04-23 DIAGNOSIS — I739 Peripheral vascular disease, unspecified: Secondary | ICD-10-CM | POA: Diagnosis not present

## 2019-04-23 DIAGNOSIS — R000 Tachycardia, unspecified: Secondary | ICD-10-CM | POA: Diagnosis present

## 2019-04-23 DIAGNOSIS — R1010 Upper abdominal pain, unspecified: Secondary | ICD-10-CM | POA: Diagnosis not present

## 2019-04-23 DIAGNOSIS — Z7951 Long term (current) use of inhaled steroids: Secondary | ICD-10-CM

## 2019-04-23 DIAGNOSIS — Z885 Allergy status to narcotic agent status: Secondary | ICD-10-CM | POA: Diagnosis not present

## 2019-04-23 DIAGNOSIS — E785 Hyperlipidemia, unspecified: Secondary | ICD-10-CM | POA: Diagnosis present

## 2019-04-23 DIAGNOSIS — Z8371 Family history of colonic polyps: Secondary | ICD-10-CM

## 2019-04-23 DIAGNOSIS — Z681 Body mass index (BMI) 19 or less, adult: Secondary | ICD-10-CM | POA: Diagnosis not present

## 2019-04-23 DIAGNOSIS — I120 Hypertensive chronic kidney disease with stage 5 chronic kidney disease or end stage renal disease: Secondary | ICD-10-CM | POA: Diagnosis present

## 2019-04-23 DIAGNOSIS — D631 Anemia in chronic kidney disease: Secondary | ICD-10-CM | POA: Diagnosis present

## 2019-04-23 DIAGNOSIS — G43909 Migraine, unspecified, not intractable, without status migrainosus: Secondary | ICD-10-CM | POA: Diagnosis present

## 2019-04-23 DIAGNOSIS — F419 Anxiety disorder, unspecified: Secondary | ICD-10-CM | POA: Diagnosis present

## 2019-04-23 DIAGNOSIS — K551 Chronic vascular disorders of intestine: Secondary | ICD-10-CM

## 2019-04-23 DIAGNOSIS — K265 Chronic or unspecified duodenal ulcer with perforation: Secondary | ICD-10-CM | POA: Diagnosis not present

## 2019-04-23 DIAGNOSIS — K920 Hematemesis: Secondary | ICD-10-CM | POA: Diagnosis not present

## 2019-04-23 DIAGNOSIS — Z79899 Other long term (current) drug therapy: Secondary | ICD-10-CM

## 2019-04-23 DIAGNOSIS — R202 Paresthesia of skin: Secondary | ICD-10-CM | POA: Diagnosis present

## 2019-04-23 DIAGNOSIS — M1990 Unspecified osteoarthritis, unspecified site: Secondary | ICD-10-CM | POA: Diagnosis present

## 2019-04-23 DIAGNOSIS — R935 Abnormal findings on diagnostic imaging of other abdominal regions, including retroperitoneum: Secondary | ICD-10-CM | POA: Diagnosis not present

## 2019-04-23 DIAGNOSIS — R109 Unspecified abdominal pain: Secondary | ICD-10-CM | POA: Diagnosis present

## 2019-04-23 LAB — CBC
HCT: 43.9 % (ref 36.0–46.0)
Hemoglobin: 13.2 g/dL (ref 12.0–15.0)
MCH: 28 pg (ref 26.0–34.0)
MCHC: 30.1 g/dL (ref 30.0–36.0)
MCV: 93 fL (ref 80.0–100.0)
Platelets: 265 10*3/uL (ref 150–400)
RBC: 4.72 MIL/uL (ref 3.87–5.11)
RDW: 17.3 % — ABNORMAL HIGH (ref 11.5–15.5)
WBC: 9 10*3/uL (ref 4.0–10.5)
nRBC: 0.2 % (ref 0.0–0.2)

## 2019-04-23 LAB — COMPREHENSIVE METABOLIC PANEL
ALT: 12 U/L (ref 0–44)
AST: 19 U/L (ref 15–41)
Albumin: 3 g/dL — ABNORMAL LOW (ref 3.5–5.0)
Alkaline Phosphatase: 69 U/L (ref 38–126)
Anion gap: 15 (ref 5–15)
BUN: 31 mg/dL — ABNORMAL HIGH (ref 8–23)
CO2: 22 mmol/L (ref 22–32)
Calcium: 8.8 mg/dL — ABNORMAL LOW (ref 8.9–10.3)
Chloride: 99 mmol/L (ref 98–111)
Creatinine, Ser: 5.24 mg/dL — ABNORMAL HIGH (ref 0.44–1.00)
GFR calc Af Amer: 9 mL/min — ABNORMAL LOW (ref >60)
GFR calc non Af Amer: 8 mL/min — ABNORMAL LOW (ref >60)
Glucose, Bld: 117 mg/dL — ABNORMAL HIGH (ref 70–99)
Potassium: 4.3 mmol/L (ref 3.5–5.1)
Sodium: 136 mmol/L (ref 135–145)
Total Bilirubin: 0.5 mg/dL (ref 0.3–1.2)
Total Protein: 6.5 g/dL (ref 6.5–8.1)

## 2019-04-23 LAB — URINALYSIS, ROUTINE W REFLEX MICROSCOPIC
Bacteria, UA: NONE SEEN
Bilirubin Urine: NEGATIVE
Glucose, UA: NEGATIVE mg/dL
Hgb urine dipstick: NEGATIVE
Ketones, ur: NEGATIVE mg/dL
Leukocytes,Ua: NEGATIVE
Nitrite: NEGATIVE
Protein, ur: >=300 mg/dL — AB
Specific Gravity, Urine: 1.029 (ref 1.005–1.030)
pH: 8 (ref 5.0–8.0)

## 2019-04-23 LAB — RAPID URINE DRUG SCREEN, HOSP PERFORMED
Amphetamines: NOT DETECTED
Barbiturates: NOT DETECTED
Benzodiazepines: NOT DETECTED
Cocaine: NOT DETECTED
Opiates: NOT DETECTED
Tetrahydrocannabinol: NOT DETECTED

## 2019-04-23 LAB — RESPIRATORY PANEL BY RT PCR (FLU A&B, COVID)
Influenza A by PCR: NEGATIVE
Influenza B by PCR: NEGATIVE
SARS Coronavirus 2 by RT PCR: NEGATIVE

## 2019-04-23 LAB — LIPASE, BLOOD: Lipase: 76 U/L — ABNORMAL HIGH (ref 11–51)

## 2019-04-23 LAB — ETHANOL
Alcohol, Ethyl (B): <10 mg/dL (ref <10)
Alcohol, Ethyl (B): <10 mg/dL (ref <10)

## 2019-04-23 LAB — CBG MONITORING, ED: Glucose-Capillary: 111 mg/dL — ABNORMAL HIGH (ref 70–99)

## 2019-04-23 MED ORDER — ACETAMINOPHEN 650 MG RE SUPP: 650.0000 mg | Freq: Four times a day (QID) | RECTAL | Status: DC | PRN

## 2019-04-23 MED ORDER — SODIUM CHLORIDE 0.9 % IV BOLUS
500.0000 mL | Freq: Once | INTRAVENOUS | Status: AC
  Administered 2019-04-23: 500 mL via INTRAVENOUS

## 2019-04-23 MED ORDER — IPRATROPIUM-ALBUTEROL 0.5-2.5 (3) MG/3ML IN SOLN: 3.0000 mL | Freq: Four times a day (QID) | RESPIRATORY_TRACT | Status: DC | PRN

## 2019-04-23 MED ORDER — HYDROXYZINE HCL 25 MG PO TABS
25.0000 mg | ORAL_TABLET | Freq: Three times a day (TID) | ORAL | Status: DC | PRN
  Administered 2019-04-24: 25 mg via ORAL
  Filled 2019-04-23 (×2): qty 1

## 2019-04-23 MED ORDER — PANTOPRAZOLE SODIUM 40 MG IV SOLR
40.0000 mg | Freq: Once | INTRAVENOUS | Status: AC
  Administered 2019-04-23: 40 mg via INTRAVENOUS
  Filled 2019-04-23: qty 40

## 2019-04-23 MED ORDER — ACETAMINOPHEN 325 MG PO TABS
650.0000 mg | ORAL_TABLET | Freq: Four times a day (QID) | ORAL | Status: DC | PRN
  Filled 2019-04-23: qty 2

## 2019-04-23 MED ORDER — METOPROLOL TARTRATE 5 MG/5ML IV SOLN
5.0000 mg | Freq: Three times a day (TID) | INTRAVENOUS | Status: DC
  Administered 2019-04-23 – 2019-04-24 (×3): 5 mg via INTRAVENOUS
  Filled 2019-04-23 (×3): qty 5

## 2019-04-23 MED ORDER — PANTOPRAZOLE SODIUM 40 MG IV SOLR
40.0000 mg | Freq: Two times a day (BID) | INTRAVENOUS | Status: DC
  Administered 2019-04-27 – 2019-04-28 (×3): 40 mg via INTRAVENOUS
  Filled 2019-04-23 (×4): qty 40

## 2019-04-23 MED ORDER — MOMETASONE FURO-FORMOTEROL FUM 200-5 MCG/ACT IN AERO
2.0000 | INHALATION_SPRAY | Freq: Two times a day (BID) | RESPIRATORY_TRACT | Status: DC
  Administered 2019-04-24 – 2019-04-26 (×4): 2 via RESPIRATORY_TRACT
  Filled 2019-04-23 (×2): qty 8.8

## 2019-04-23 MED ORDER — SODIUM CHLORIDE 0.9 % IV SOLN: 80.0000 mg | Freq: Once | INTRAVENOUS | Status: DC

## 2019-04-23 MED ORDER — IOHEXOL 300 MG/ML  SOLN
100.0000 mL | Freq: Once | INTRAMUSCULAR | Status: AC | PRN
  Administered 2019-04-23: 100 mL via INTRAVENOUS

## 2019-04-23 MED ORDER — ONDANSETRON HCL 4 MG/2ML IJ SOLN: 4.0000 mg | Freq: Four times a day (QID) | INTRAMUSCULAR | Status: DC | PRN

## 2019-04-23 MED ORDER — ONDANSETRON HCL 4 MG PO TABS: 4.0000 mg | ORAL_TABLET | Freq: Four times a day (QID) | ORAL | Status: DC | PRN

## 2019-04-23 MED ORDER — CAMPHOR-MENTHOL 0.5-0.5 % EX LOTN
1.0000 "application " | TOPICAL_LOTION | Freq: Three times a day (TID) | CUTANEOUS | Status: DC | PRN
  Administered 2019-04-25: 1 via TOPICAL
  Filled 2019-04-23: qty 222

## 2019-04-23 MED ORDER — FENTANYL CITRATE (PF) 100 MCG/2ML IJ SOLN
50.0000 ug | Freq: Once | INTRAMUSCULAR | Status: AC
  Administered 2019-04-23: 50 ug via INTRAVENOUS
  Filled 2019-04-23: qty 2

## 2019-04-23 MED ORDER — CHLORHEXIDINE GLUCONATE CLOTH 2 % EX PADS
6.0000 | MEDICATED_PAD | Freq: Every day | CUTANEOUS | Status: DC
  Administered 2019-04-24 – 2019-04-29 (×5): 6 via TOPICAL

## 2019-04-23 MED ORDER — ONDANSETRON HCL 4 MG/2ML IJ SOLN
4.0000 mg | Freq: Once | INTRAMUSCULAR | Status: AC
  Administered 2019-04-23: 4 mg via INTRAVENOUS
  Filled 2019-04-23: qty 2

## 2019-04-23 MED ORDER — POLYVINYL ALCOHOL 1.4 % OP SOLN
1.0000 [drp] | Freq: Every day | OPHTHALMIC | Status: DC | PRN
  Filled 2019-04-23: qty 15

## 2019-04-23 MED ORDER — SODIUM CHLORIDE 0.9% FLUSH
3.0000 mL | Freq: Two times a day (BID) | INTRAVENOUS | Status: DC
  Administered 2019-04-24 – 2019-04-27 (×7): 3 mL via INTRAVENOUS

## 2019-04-23 MED ORDER — SODIUM CHLORIDE 0.9 % IV SOLN
8.0000 mg/h | INTRAVENOUS | Status: AC
  Administered 2019-04-23 – 2019-04-25 (×5): 8 mg/h via INTRAVENOUS
  Filled 2019-04-23 (×9): qty 80

## 2019-04-23 NOTE — ED Provider Notes (Signed)
[facility] EMERGENCY DEPARTMENT Provider Note   CSN: [id number] Arrival date & time: 04/23/19  1212     History Chief Complaint  Patient presents with  . Abdominal Pain  . Nausea    Christina [name] is a 69 y.o. female.  HPI    69 year old female history of COPD, end-stage renal disease, on Tuesday Thursday Saturday dialysis, history of recent GI bleed, presents today complaining of abdominal pain.  She states that she was recently admitted for similar symptoms.  Since discharge, she has been improved but not back to her baseline.  She ate yesterday at noon and then did not eat again due to nausea.  At 6 AM this morning she had onset of severe upper abdominal pain.  She vomited and had 4 loose stools after that.  There is no obvious blood in the emesis or stool..  Patient denies history of alcohol use, history of pancreatitis, she has not on blood thinners and reports no medication for atrial fibrillation.  Review of records show that patient was transferred here on March 8 secondary to GI bleeding.  She has initially been admitted at [facility] with a hemoglobin of 5.1 and was transfused 2 units.  Per report this was thought to be secondary to duodenitis versus gastritis.  Initial presentation was the end of February.  She represented to [facility] in early March and was transferred to [facility].  On arrival at [facility], patient had been asymptomatic denies any further abdominal pain or vomiting for 24 hours.  She was discharged to home.  Review of admission [id number] shows patient present at that time with abdominal pain, chest pain, diarrhea and weakness.  She had an acute blood loss anemia with hemoglobin of 5 on admission.  She was also in A. fib with RVR and a urine drug screen positive for cocaine.  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  Patient received 2 units  of packed red blood cells and underwent upper endoscopy.  EGD revealed esophagitis, gastritis, multiple duodenal ulcers.  She was placed on proton pump inhibitors and sacral fate and maintained a stable hemoglobin during that admission.  Past Medical History:  Diagnosis Date  . Anemia   . Anxiety   . Arthritis   . Asthma   . Atrial fibrillation ([place])   . Chronic kidney disease    Dialysis T/Th/Sa  started in March 2020  . COPD (chronic obstructive pulmonary disease) ([place])   . Coronary artery disease    2 stents  . Depression   . GERD (gastroesophageal reflux disease)   . Gout   . Headache    migraines  . History of kidney stones   . Hyperlipidemia   . Hypertension   . Pneumonia   . Small bowel obstruction [facility])     Patient Active Problem List   Diagnosis Date Noted  . Acute gastric ulcer without hemorrhage or perforation   . Hematemesis 04/04/2019  . Atrial fibrillation with RVR ([place])   . Duodenal ulcer   . Gastritis and gastroduodenitis   . Acute blood loss anemia   . 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 RUQ pain   . Anemia due to chronic kidney disease, on chronic dialysis ([place])   . AF (paroxysmal atrial fibrillation) ([place])   . Chest pain 03/19/2019  . Hyperkalemia 02/28/2019  . Uremia 02/27/2019  . Volume overload 02/21/2019  .  Abdominal pain 02/21/2019  . Non-compliance with renal dialysis ([place]) 02/21/2019  . Respiratory failure ([place]) 02/04/2019  . Acute and chr resp failure, unsp w hypoxia or hypercapnia ([place])   . Afib ([place]) 01/25/2019  . SBO (small bowel obstruction) ([place]) 08/10/2018  . Anemia due to chronic blood loss   . Heme positive stool   . Platelet inhibition due to Plavix   . Acute GI bleeding 05/06/2018  . ESRD (end stage renal disease) ([place]) 05/06/2018  . Anxiety 05/06/2018  . Bipolar affective ([place]) 05/06/2018  . CAD (coronary artery disease), native coronary artery 05/06/2018  . COPD with chronic bronchitis ([place]) 05/06/2018  . CVA  (cerebral vascular accident) ([place]) 05/06/2018  . Essential hypertension 05/06/2018  . Hyperlipidemia 05/06/2018    Past Surgical History:  Procedure Laterality Date  . ABDOMINAL HYSTERECTOMY    . ABDOMINAL SURGERY     for small bowel obstruction - x 2  . APPENDECTOMY    . AV FISTULA PLACEMENT Left 08/04/2018   Procedure: ARTERIOVENOUS (AV) FISTULA CREATION LEFT ARM;  Surgeon: Waynetta Sandy, MD;  Location: [facility];  Service: Vascular;  Laterality: Left;  . BASCILIC VEIN TRANSPOSITION Left 11/24/2018   Procedure: SECOND STAGE BASILIC VEIN TRANSPOSITION LEFT ARM;  Surgeon: Waynetta Sandy, MD;  Location: [facility];  Service: Vascular;  Laterality: Left;  . BIOPSY  03/26/2019   Procedure: BIOPSY;  Surgeon: Lavena Bullion, DO;  Location: [facility];  Service: Gastroenterology;;  . CARDIAC CATHETERIZATION    . CORONARY ANGIOPLASTY  ?2003/2004  . ESOPHAGOGASTRODUODENOSCOPY (EGD) WITH PROPOFOL N/A 03/26/2019   Procedure: ESOPHAGOGASTRODUODENOSCOPY (EGD) WITH PROPOFOL;  Surgeon: Lavena Bullion, DO;  Location: [facility];  Service: Gastroenterology;  Laterality: N/A;  . FACIAL RECONSTRUCTION SURGERY     x 2  . HERNIA REPAIR       OB History   No obstetric history on file.     No family history on file.  Social History   Tobacco Use  . Smoking status: Former Smoker    Packs/day: 0.50    Years: 50.00    Pack years: 25.00    Quit date: 2019    Years since quitting: 2.2  . Smokeless tobacco: Never Used  Substance Use Topics  . Alcohol use: Not Currently    Comment: quit 2019 - "I was a drunk"  . Drug use: Not Currently    Types: Marijuana    Comment: h/o drug use - "I was raised in the 60s"; last use 18 months ago; smokes marijuana periodically    Home Medications Prior to Admission medications   Medication Sig Start Date End Date Taking? Authorizing Provider  albuterol (PROVENTIL) (2.5 MG/3ML) 0.083% nebulizer solution Take 2.5 mg by nebulization every  6 (six) hours as needed for wheezing or shortness of breath.    [provider]  allopurinol (ZYLOPRIM) 100 MG tablet Take 100 mg by mouth daily. 02/18/18   [provider]  amLODipine (NORVASC) 10 MG tablet Take 10 mg by mouth daily.    [provider]  budesonide-formoterol (SYMBICORT) 160-4.5 MCG/ACT inhaler Inhale 2 puffs into the lungs every 4 (four) hours as needed (shortness of breath/wheezing).    [provider]  Carboxymethylcellul-Glycerin (LUBRICATING EYE DROPS OP) Place 1 drop into both eyes daily as needed (dry eyes).    [provider]  Cholecalciferol (VITAMIN D3) 50 MCG (2000 UT) capsule Take 2,000 Units by mouth daily.     [provider]  dicyclomine (BENTYL) 10 MG  capsule Take 20 mg by mouth at bedtime.  08/27/17   [provider]  docusate sodium (COLACE) 100 MG capsule Take 1 capsule (100 mg total) by mouth 2 (two) times daily. 08/11/18   Hongalgi, Lenis Dickinson, MD  doxylamine, Sleep, (UNISOM) 25 MG tablet Take 25 mg by mouth at bedtime as needed for sleep.    [provider]  ferric citrate (AURYXIA) 1 GM 210 MG(Fe) tablet Take 420-630 mg by mouth See admin instructions. Take 3 tablets (630 mg) by mouth three times daily with meals and 2 tablets (420 mg) with snacks    [provider]  gabapentin (NEURONTIN) 300 MG capsule Take 1 capsule (300 mg total) by mouth at bedtime. 02/23/19   Mercy Riding, MD  Ipratropium-Albuterol (COMBIVENT) 20-100 MCG/ACT AERS respimat Inhale 2 puffs into the lungs 4 (four) times daily as needed for wheezing. 09/11/17   [provider]  isosorbide mononitrate (IMDUR) 30 MG 24 hr tablet Take 30 mg by mouth daily. 08/17/18   [provider]  lidocaine-prilocaine (EMLA) cream Apply 1 application topically daily as needed (1-2 hours before dialysis (cover with occlusive dressing)).  09/03/18   [provider]  metoprolol tartrate (LOPRESSOR) 100 MG tablet Take 1  tablet (100 mg total) by mouth 2 (two) times daily. 01/26/19   Little Ishikawa, MD  nitroGLYCERIN (NITROSTAT) 0.4 MG SL tablet Place 0.4 mg under the tongue every 5 (five) minutes as needed for chest pain.  09/18/16   [provider]  ondansetron (ZOFRAN) 4 MG tablet Take 4 mg by mouth 3 (three) times daily as needed for nausea. 09/14/18   [provider]  pantoprazole (PROTONIX) 40 MG tablet Take 1 tablet (40 mg total) by mouth 2 (two) times daily. 04/04/19   Shelly Coss, MD  polyethylene glycol (MIRALAX / GLYCOLAX) 17 g packet Take 17 g by mouth daily as needed for mild constipation. 03/30/19   Mercy Riding, MD  pravastatin (PRAVACHOL) 80 MG tablet Take 80 mg by mouth every evening.  06/30/16   [provider]  sennosides-docusate sodium (SENOKOT-S) 8.6-50 MG tablet Take 1 tablet by mouth 2 (two) times daily.     [provider]  sucralfate (CARAFATE) 1 GM/10ML suspension Take 10 mLs (1 g total) by mouth every 6 (six) hours. 03/30/19 05/29/19  Mercy Riding, MD    Allergies    Cyclobenzaprine, Morphine, Penicillins, Ambien [zolpidem], Codeine, and Hydromorphone  Review of Systems   Review of Systems  All other systems reviewed and are negative.   Physical Exam Updated Vital Signs BP (!) 143/92 (BP Location: Right Arm)   Pulse 100   Temp 98.8 F (37.1 C) (Oral)   Resp (!) 21   SpO2 98%   Physical Exam Vitals and nursing note reviewed.  Constitutional:      General: She is in acute distress.     Appearance: She is well-developed. She is ill-appearing.  HENT:     Head: Normocephalic.     Mouth/Throat:     Mouth: Mucous membranes are moist.  Eyes:     Extraocular Movements: Extraocular movements intact.  Cardiovascular:     Rate and Rhythm: Regular rhythm. Tachycardia present.  Pulmonary:     Effort: Pulmonary effort is normal.     Breath sounds: Normal breath sounds.  Abdominal:     General: Abdomen is flat. Bowel sounds are decreased.      Palpations: Abdomen is soft.     Tenderness: There is  generalized abdominal tenderness.     Hernia: No hernia is present.  Genitourinary:    Rectum: Normal.     Comments: Abdominal exam performed with no point tenderness no stool present Skin:    General: Skin is warm and dry.     Capillary Refill: Capillary refill takes less than 2 seconds.  Neurological:     General: No focal deficit present.     Mental Status: She is alert.  Psychiatric:        Mood and Affect: Mood normal.     ED Results / Procedures / Treatments   Labs (all labs ordered are listed, but only abnormal results are displayed) Labs Reviewed  CBG MONITORING, ED - Abnormal; Notable for the following components:      Result Value   Glucose-Capillary 111 (*)    All other components within normal limits  CBC  COMPREHENSIVE METABOLIC PANEL  LIPASE, BLOOD  URINALYSIS, ROUTINE W REFLEX MICROSCOPIC    EKG EKG Interpretation  Date/Time:  Saturday April 23 2019 12:33:29 EDT Ventricular Rate:  144 PR Interval:    QRS Duration: 80 QT Interval:  307 QTC Calculation: 476 R Axis:   80 Text Interpretation: Atrial fibrillation Multiple ventricular premature complexes ST depression, probably rate related Confirmed by Pattricia Boss [phone]) on 04/23/2019 2:34:05 PM   Radiology No results found.  Procedures .Critical Care Performed by: Pattricia Boss, MD Authorized by: Pattricia Boss, MD   Critical care provider statement:    Critical care time (minutes):  65   Critical care end time:  04/23/2019 4:22 PM   Critical care was time spent personally by me on the following activities:  Discussions with consultants, evaluation of patient's response to treatment, examination of patient, ordering and performing treatments and interventions, ordering and review of laboratory studies, ordering and review of radiographic studies, pulse oximetry, re-evaluation of patient's condition, obtaining history from patient or surrogate and  review of old charts   (including critical care time)  Medications Ordered in ED Medications  sodium chloride 0.9 % bolus 500 mL (has no administration in time range)    ED Course  I have reviewed the triage vital signs and the nursing notes.  Pertinent labs & imaging results that were available during my care of the patient were reviewed by me and considered in my medical decision making (see chart for details). 2:34 PM Patient with regular rate and rhythm noted on monitor.  Repeat EKG with nsr CHA2DS2/VAS Stroke Risk Points  Current as of 2 minutes ago     7 >= 2 Points: High Risk  1 - 1.99 Points: Medium Risk  0 Points: Low Risk    The previous score was 8 on 05/06/2018.: Last Change:     Details    This score determines the patient's risk of having a stroke if the  patient has atrial fibrillation.       Points Metrics  1 Has Congestive Heart Failure:  Yes    Current as of 2 minutes ago  1 Has Vascular Disease:  Yes    Current as of 2 minutes ago  1 Has Hypertension:  Yes    Current as of 2 minutes ago  1 Age:  26    Current as of 2 minutes ago  0 Has Diabetes:  No    Current as of 2 minutes ago  2 Had Stroke:  Yes  Had TIA:  No  Had thromboembolism:  No    Current as of 2  minutes ago  1 Female:  Yes    Current as of 2 minutes ago          Signed,  Pattricia Boss, MD    04/23/2019 2:34 PM     MDM Rules/Calculators/A&P                     69 yo female with recent admissions for abdominal pain and GI bleeding now with sudden onset of worsened abdominal pain at 0600.  Repeat CT with 18 mm duodenal ulcer that does not show evidence of perforation at this time. Patient initially with afib rvr but has converted to nsr with iv fluids and pain management.   EGD completed on 2/27 notable for LA Grade B esophagitis, 2 cm hiatal hernia, mild gastritis, and multiple cratered duodenal ulcers/duodenitis, the largest being 15 mm and deeply cratered with pigmented material at the  base.  This lesion was much too large for endoscopic intervention.  Biopsies pending.  Discussed with Dr. Lorin Mercy. Plan consult with general surgery Plan admission 1-acute abdominal pain- duodenal ulcer ? Perforation. Patient hemodynamically stable currently.  Discussed with Dr. Grandville Silos and he will see in consultation 2- afib rvr- patient with afib rvr which converted in ED.  Patient not currently candidate for anticoagulation given current GI symptoms 3-#ESRD on dialysis T/TH/Saturday- currently appears stable without volume overload and normal potassium Dr. Lorin Mercy in department has assumed care final Clinical Impression(s) / ED Diagnoses Final diagnoses:  Generalized abdominal pain  Duodenal ulcer  ESRD (end stage renal disease) ([place])  Paroxysmal atrial fibrillation [facility])    Rx / DC Orders ED Discharge Orders    None       Pattricia Boss, MD 04/23/19 1623

## 2019-04-23 NOTE — ED Notes (Signed)
Multiple attempts at better IV access made. IV therapy able to obtain secondary access but states if this IV infiltrates, pt may benefit from midline or PICC line.

## 2019-04-23 NOTE — H&P (Signed)
History and Physical    Christina [name] BJS:[id number] DOB: April 08, 1950 DOA: 04/23/2019  PCP: Maggie Schwalbe, PA-C Consultants:  Donzetta Matters - vascular; Otho Perl - cardiology; Audie Clear - nephrology  Patient coming from:  Home - lives alone; NOK: Daughter, [name], [phone]  Chief Complaint: Abdominal pain  HPI: Christina [name] is a 69 y.o. female with medical history significant of SBO; HTN; HLD; depression; CAD with stents; COPD; ESRD on TTS HD; and afib presenting with abdominal pain.  I admitted her for abdominal pain on 1/25; this was thought to be related to missed HD and she was discharged on 1/27.  She returned for abdominal pain from 1/31-2/2, again after missing HD. She had a subsequent ER visit and then hospitalization and presented again from 2/26-3/3 with abdominal pain and ABLA in the setting of cocaine use.  EGD was performed and showed esophagitis, gastritis, and multiple duodenal ulcers.  She was started on PPI and Carafate.  She returned again on 3/8 with concern for hematemesis (hemoccult negative). PPI was increased to twice daily dosing.  She has been taking her meds since the last discharge.  She has been feeling worse, "it's like my body never got better."  +n/v, no blood, usually about 4 times a day.  Her stool is "just completely black anyway" due to iron, intermittent loose and hard stools.  Her pain is right in her umbilical region.  Drinking coffee makes it worse.  Milk makes it better.  She did attend HD Tuesday and Thursday of this week but was sent to the ER this AM and so missed HD today.    ED Course:   Never back to baseline but "ok.  Nausea yesterday, went to [facility] last night and discharged.  +vomiting, loose BMs.  Abdominal pain this AM. Abdomen is TTP.  Has an 18 mm duodenal ulcer that appears close to perforation.  Review of Systems: As per HPI; otherwise review of systems reviewed and negative.   Ambulatory Status:  Ambulates with a cane  Past Medical  History:  Diagnosis Date  . Anemia   . Anxiety   . Arthritis   . Asthma   . Atrial fibrillation ([place])   . Chronic kidney disease    Dialysis T/Th/Sa  started in March 2020  . COPD (chronic obstructive pulmonary disease) ([place])   . Coronary artery disease    2 stents  . Depression   . GERD (gastroesophageal reflux disease)   . Gout   . Headache    migraines  . History of kidney stones   . Hyperlipidemia   . Hypertension   . Pneumonia   . Small bowel obstruction [facility])     Past Surgical History:  Procedure Laterality Date  . ABDOMINAL HYSTERECTOMY    . ABDOMINAL SURGERY     for small bowel obstruction - x 2  . APPENDECTOMY    . AV FISTULA PLACEMENT Left 08/04/2018   Procedure: ARTERIOVENOUS (AV) FISTULA CREATION LEFT ARM;  Surgeon: Waynetta Sandy, MD;  Location: [place];  Service: Vascular;  Laterality: Left;  . BASCILIC VEIN TRANSPOSITION Left 11/24/2018   Procedure: SECOND STAGE BASILIC VEIN TRANSPOSITION LEFT ARM;  Surgeon: Waynetta Sandy, MD;  Location: [place];  Service: Vascular;  Laterality: Left;  . BIOPSY  03/26/2019   Procedure: BIOPSY;  Surgeon: Lavena Bullion, DO;  Location: [place];  Service: Gastroenterology;;  . CARDIAC CATHETERIZATION    . CORONARY ANGIOPLASTY  ?2003/2004  . ESOPHAGOGASTRODUODENOSCOPY (EGD) WITH PROPOFOL  N/A 03/26/2019   Procedure: ESOPHAGOGASTRODUODENOSCOPY (EGD) WITH PROPOFOL;  Surgeon: Lavena Bullion, DO;  Location: [place];  Service: Gastroenterology;  Laterality: N/A;  . FACIAL RECONSTRUCTION SURGERY     x 2  . HERNIA REPAIR      Social History   Socioeconomic History  . Marital status: Single    Spouse name: Not on file  . Number of children: Not on file  . Years of education: Not on file  . Highest education level: Not on file  Occupational History  . Occupation: retired  Tobacco Use  . Smoking status: Former Smoker    Packs/day: 0.50    Years: 50.00    Pack years: 25.00    Quit date: 2019      Years since quitting: 2.2  . Smokeless tobacco: Never Used  Substance and Sexual Activity  . Alcohol use: Not Currently    Comment: quit 2019 - "I was a drunk"  . Drug use: Not Currently    Types: Marijuana    Comment: h/o drug use - "I was raised in the 60s"; last use 18 months ago; smokes marijuana periodically  . Sexual activity: Not on file  Other Topics Concern  . Not on file  Social History Narrative  . Not on file   Social Determinants of Health   Financial Resource Strain:   . Difficulty of Paying Living Expenses:   Food Insecurity:   . Worried About Charity fundraiser in the Last Year:   . Arboriculturist in the Last Year:   Transportation Needs:   . Film/video editor (Medical):   Marland Kitchen Lack of Transportation (Non-Medical):   Physical Activity:   . Days of Exercise per Week:   . Minutes of Exercise per Session:   Stress:   . Feeling of Stress :   Social Connections:   . Frequency of Communication with Friends and Family:   . Frequency of Social Gatherings with Friends and Family:   . Attends Religious Services:   . Active Member of Clubs or Organizations:   . Attends Archivist Meetings:   Marland Kitchen Marital Status:   Intimate Partner Violence:   . Fear of Current or Ex-Partner:   . Emotionally Abused:   Marland Kitchen Physically Abused:   . Sexually Abused:     Allergies  Allergen Reactions  . Cyclobenzaprine Anaphylaxis and Other (See Comments)    "stopped heart"   . Morphine Anaphylaxis    "stopped heart"  . Penicillins Shortness Of Breath, Swelling and Palpitations    Did it involve swelling of the face/tongue/throat, SOB, or low BP? Yes Did it involve sudden or severe rash/hives, skin peeling, or any reaction on the inside of your mouth or nose? No Did you need to seek medical attention at a hospital or doctor's office? No When did it last happen?years  If all above answers are "NO", may proceed with cephalosporin use.   . Ambien [Zolpidem] Other  (See Comments)    Severe confusion  . Codeine Itching and Rash  . Hydromorphone Other (See Comments)    If administered quickly, felt like hand was "exploding"     Family History  Problem Relation Age of Onset  . Ulcers Sister        sick a long time, improved after surgery    Prior to Admission medications   Medication Sig Start Date End Date Taking? Authorizing Provider  albuterol (PROVENTIL) (2.5 MG/3ML) 0.083% nebulizer solution Take 2.5 mg  by nebulization every 6 (six) hours as needed for wheezing or shortness of breath.    [provider]  allopurinol (ZYLOPRIM) 100 MG tablet Take 100 mg by mouth daily. 02/18/18   [provider]  amLODipine (NORVASC) 10 MG tablet Take 10 mg by mouth daily.    [provider]  budesonide-formoterol (SYMBICORT) 160-4.5 MCG/ACT inhaler Inhale 2 puffs into the lungs every 4 (four) hours as needed (shortness of breath/wheezing).    [provider]  Carboxymethylcellul-Glycerin (LUBRICATING EYE DROPS OP) Place 1 drop into both eyes daily as needed (dry eyes).    [provider]  Cholecalciferol (VITAMIN D3) 50 MCG (2000 UT) capsule Take 2,000 Units by mouth daily.     [provider]  dicyclomine (BENTYL) 10 MG capsule Take 20 mg by mouth at bedtime.  08/27/17   [provider]  docusate sodium (COLACE) 100 MG capsule Take 1 capsule (100 mg total) by mouth 2 (two) times daily. 08/11/18   Hongalgi, Lenis Dickinson, MD  doxylamine, Sleep, (UNISOM) 25 MG tablet Take 25 mg by mouth at bedtime as needed for sleep.    [provider]  ferric citrate (AURYXIA) 1 GM 210 MG(Fe) tablet Take 420-630 mg by mouth See admin instructions. Take 3 tablets (630 mg) by mouth three times daily with meals and 2 tablets (420 mg) with snacks    [provider]  gabapentin (NEURONTIN) 300 MG capsule Take 1 capsule (300 mg total) by mouth at bedtime. 02/23/19   Mercy Riding, MD  Ipratropium-Albuterol (COMBIVENT)  20-100 MCG/ACT AERS respimat Inhale 2 puffs into the lungs 4 (four) times daily as needed for wheezing. 09/11/17   [provider]  isosorbide mononitrate (IMDUR) 30 MG 24 hr tablet Take 30 mg by mouth daily. 08/17/18   [provider]  lidocaine-prilocaine (EMLA) cream Apply 1 application topically daily as needed (1-2 hours before dialysis (cover with occlusive dressing)).  09/03/18   [provider]  metoprolol tartrate (LOPRESSOR) 100 MG tablet Take 1 tablet (100 mg total) by mouth 2 (two) times daily. 01/26/19   Little Ishikawa, MD  nitroGLYCERIN (NITROSTAT) 0.4 MG SL tablet Place 0.4 mg under the tongue every 5 (five) minutes as needed for chest pain.  09/18/16   [provider]  ondansetron (ZOFRAN) 4 MG tablet Take 4 mg by mouth 3 (three) times daily as needed for nausea. 09/14/18   [provider]  pantoprazole (PROTONIX) 40 MG tablet Take 1 tablet (40 mg total) by mouth 2 (two) times daily. 04/04/19   Shelly Coss, MD  polyethylene glycol (MIRALAX / GLYCOLAX) 17 g packet Take 17 g by mouth daily as needed for mild constipation. 03/30/19   Mercy Riding, MD  pravastatin (PRAVACHOL) 80 MG tablet Take 80 mg by mouth every evening.  06/30/16   [provider]  sennosides-docusate sodium (SENOKOT-S) 8.6-50 MG tablet Take 1 tablet by mouth 2 (two) times daily.     [provider]  sucralfate (CARAFATE) 1 GM/10ML suspension Take 10 mLs (1 g total) by mouth every 6 (six) hours. 03/30/19 05/29/19  Mercy Riding, MD    Physical Exam: Vitals:   04/23/19 1400 04/23/19 1430 04/23/19 1445 04/23/19 1500  BP: 133/86  (!) 174/79 (!) 169/71  Pulse: (!) 125 87  81  Resp: 17 15 18 16   Temp:      TempSrc:      SpO2: 100% 100%  100%     . General:  Appears  calm and comfortable and is NAD; she is cachectic and appears chronically ill . Eyes:  PERRL, EOMI, normal lids, iris . ENT:  grossly normal hearing, lips & tongue, mmm . Neck:  no LAD, masses  or thyromegaly . Cardiovascular:  Irregularly irregular but rate controlled, no r/g, loud 4/6 systolic murmur. No LE edema.  R temp HD catheter. Marland Kitchen Respiratory:   CTA bilaterally with no wheezes/rales/rhonchi.  Normal respiratory effort. . Abdomen:  Diffuse abdominal TTP with voluntary guarding . Skin:  no rash or induration seen on limited exam . Musculoskeletal:  grossly normal tone BUE/BLE, good ROM, no bony abnormality . Psychiatric:  grossly normal mood and affect, speech fluent and appropriate, AOx3 . Neurologic:  CN 2-12 grossly intact, moves all extremities in coordinated fashion    Radiological Exams on Admission: CT ABDOMEN PELVIS W CONTRAST  Result Date: 04/23/2019 CLINICAL DATA:  Nausea, vomiting, and diarrhea. Tachycardia. EXAM: CT ABDOMEN AND PELVIS WITH CONTRAST 2:19 p.m. TECHNIQUE: Multidetector CT imaging of the abdomen and pelvis was performed using the standard protocol following bolus administration of intravenous contrast. CONTRAST:  156mL OMNIPAQUE IOHEXOL 300 MG/ML  SOLN COMPARISON:  CT scans dated 04/23/2019 at 4:14 a.m. at [facility] and 03/06/2019 and 03/25/2019 FINDINGS: Lower chest: No acute abnormality. Severe atherosclerosis of the distal thoracic aorta. Slight linear scarring at the left lung base, stable. Hepatobiliary, stomach/Bowel: A small amount of fluid surrounds the contracted gallbladder. The gallbladder has contracted slightly since the prior study. The fluid extends around the duodenum just distal to the duodenal bulb. Slight prominence of multiple distal small bowel loops without obstruction. This is nonspecific. Appendix has been removed. There appears to be an 18 mm ulcer protruding posteriorly just distal to the duodenal bulb best seen on image 38 of series 7 and image 25 of series 3 and image 42 of series 6. This was not definable on the prior study done earlier today. Pancreas: Unremarkable. No pancreatic ductal dilatation or surrounding inflammatory  changes. Spleen: Normal in size without focal abnormality. Adrenals/Urinary Tract: Bilateral adrenal adenomas, unchanged. Multiple bilateral renal cysts. No hydronephrosis. Bladder is normal. Vascular/Lymphatic: Extensive aortic atherosclerosis. No adenopathy. Reproductive: Status post hysterectomy. No adnexal masses. Other: No ascites. No abdominal wall hernias. Musculoskeletal: No acute or significant osseous findings. IMPRESSION: 1. Findings consistent with an 18 mm ulcer protruding posteriorly just distal to the duodenal bulb. There is fluid surrounding this ulcer and extending around the gallbladder which could represent impending perforation. 2. Small amount of fluid surrounds the contracted gallbladder. 3. Slight prominence of multiple distal small bowel loops without obstruction. This is nonspecific. 4. Stable bilateral adrenal adenomas. 5. Severe atherosclerosis of the distal thoracic aorta. Aortic Atherosclerosis (ICD10-I70.0). Electronically Signed   By: Lorriane Shire M.D.   On: 04/23/2019 15:09   DG Chest Port 1 View  Result Date: 04/23/2019 CLINICAL DATA:  Abdominal pain EXAM: PORTABLE CHEST 1 VIEW COMPARISON:  Chest radiograph dated 04/11/2019 FINDINGS: The heart is enlarged. Vascular calcifications are seen in the aortic arch. Both lungs are clear. The visualized skeletal structures are unremarkable. A right internal jugular central venous catheter tip overlies the right atrium. IMPRESSION: No active disease. Electronically Signed   By: Zerita Boers M.D.   On: 04/23/2019 13:39    EKG: Independently reviewed.  Afib with rate 144; nonspecific ST changes that are likely rate related   Labs on Admission: I have personally reviewed the available labs and imaging studies at the time of the admission.  Pertinent labs:  Glucose 117 BUN 31/Creatinine 5.24/GFR 9 Albumin 3.0 Lipase 76 Unremarkable CBC UA: >300 protein   Assessment/Plan Principal Problem:   Duodenal ulcer perforation  (HCC) Active Problems:   ESRD (end stage renal disease) (HCC)   Essential hypertension   Hyperlipidemia   Abdominal pain   AF (paroxysmal atrial fibrillation) (HCC)   Cocaine abuse (HCC)    Large duodenal ulcer, possibly perforated -Patient with multiple recurrent visits for abdominal pain and GI bleeding requiring transfusion in the last 2 months -EGD on 2/27 was performed by Dr. Bryan Lemma and showed grade B esophagitis without bleeding; 2 cm hiatal hernia; mild gastritis; and multiple duodenal ulcers in the duodenal bulb.  The largest ulcer was 15 mm and deeply cratered; it was too large for endoscopic intervention and was recommended for IR or surgical consultation if bleeding recurred.  These findings were concerning for ischemic etiology. Biopsies were reassuring. -She was positive for cocaine on 2/26, and cocaine use may be contributing to this issue. -Regardless, the patient reports compliance with her BID PPI and Carafate and yet has refractory pain and concern for perforation on CT at this time. -She requires inpatient admission to the hospital with close ongoing monitoring and will need some kind of intervention - likely surgical but possibly either endoscopically or via IR -Will resume IV Protonix drip -GI and surgery consults have been requested  Afib with RVR -Patient presenting with recurrent afib with RVR -RVR has improved while in the ER -She will be in Progressive Care and so if Diltiazem drop needs to be added this can happen -Clearly, given recent GI bleeding and persistent large ulcer, she is a poor candidate for [facility]  ESRD on HD -Patient on chronic TTS HD -She reports attending both sessions last week - although she did miss today since she came to the ER -Nephrology prn order set utilized -Nephrology has consulted   HTN/CAD -Hold Norvasc, Imdur, Lopressor for now -Will give IV Lopressor 5 mg q8h for now  COPD -Continue Combivent, Symbicort (Dulera  substitute) - she uses both prn  HLD -Hold Pravachol while NPO  Cocaine abuse -UDS positive for cocaine on 2/26 -This may be contributing to recurrent GI bleeding -She clearly needs to stop using cocaine -Current UDS is pending   Note: This patient has been tested and is pending for the novel coronavirus COVID-19; all prior tests including on 3/8 have been negative.   DVT prophylaxis: SCDs Code Status:  DNR - confirmed with patient Family Communication: None present; I spoke with her daughter by telephone at the time of admission Disposition Plan:  Home once clinically improved Consults called: Nephrology; Surgery; GI Admission status: Admit - It is my clinical opinion that admission to INPATIENT is reasonable and necessary because of the expectation that this patient will require hospital care that crosses at least 2 midnights to treat this condition based on the medical complexity of the problems presented.  Given the aforementioned information, the predictability of an adverse outcome is felt to be significant.    Karmen Bongo MD [facility]   How to contact the [facility] Attending or Consulting provider [place] or covering provider during after hours [place], for this patient?  1. Check the care team in [facility] and look for a) attending/consulting [facility] provider listed and b) the [facility] team listed 2. Log into [email or web address] and use [place]'s universal password to access. If you do not have the password, please contact the hospital operator. 3. Locate the [facility]  provider you are looking for under [facility] and page to a number that you can be directly reached. 4. If you still have difficulty reaching the provider, please page the [facility] (Director on Call) for the Hospitalists listed on amion for assistance.   04/23/2019, 5:17 PM

## 2019-04-23 NOTE — ED Triage Notes (Signed)
Pt brought in from dialysis clinic via EMS for complaints of N/V/D and tachycardia. Was seen at [facility] ER last night for same and discharged w/ diagnosis of ulcers. Pt went to dialysis this morning but was unable to begin treatment due to abdominal pain. Pt states these symptoms have been going on "a long time" and today had 5 bouts of diarrhea and 6 episodes of vomiting. Per EMS, no vomiting en route, just dry heaves.

## 2019-04-23 NOTE — ED Notes (Signed)
Pt very hard stick, unable to obtain ethanol level until new IV access was established at this time.

## 2019-04-23 NOTE — Consult Note (Signed)
Reason for Consult:Duodenal ulcer Referring Physician: D. Ray  Christina [name] is an 69 y.o. female.  HPI: 69 year old female with multiple medical problems including end-stage renal disease, COPD, coronary artery disease, and known history of duodenal ulcer.  She underwent EGD last month by Dr.Cirigliano.  She has been treated with proton pump inhibitors and Carafate.  She presented to the emergency department today with ongoing abdominal pain which she reports has not ever gone away.  Drinking milk makes it feel better, drinking coffee makes it feel worse.  She has alternating normal bowel movements and some diarrhea.  Evaluation here showed white blood cell count 9000.  CT scan of the abdomen pelvis showed an 18 mm posterior duodenal ulcer with possible "impending perforation".  No free air.  I was asked to see her from a surgical standpoint.  Past Medical History:  Diagnosis Date  . Anemia   . Anxiety   . Arthritis   . Asthma   . Atrial fibrillation ([place])   . Chronic kidney disease    Dialysis T/Th/Sa  started in March 2020  . COPD (chronic obstructive pulmonary disease) ([place])   . Coronary artery disease    2 stents  . Depression   . GERD (gastroesophageal reflux disease)   . Gout   . Headache    migraines  . History of kidney stones   . Hyperlipidemia   . Hypertension   . Pneumonia   . Small bowel obstruction [facility])     Past Surgical History:  Procedure Laterality Date  . ABDOMINAL HYSTERECTOMY    . ABDOMINAL SURGERY     for small bowel obstruction - x 2  . APPENDECTOMY    . AV FISTULA PLACEMENT Left 08/04/2018   Procedure: ARTERIOVENOUS (AV) FISTULA CREATION LEFT ARM;  Surgeon: Waynetta Sandy, MD;  Location: [place];  Service: Vascular;  Laterality: Left;  . BASCILIC VEIN TRANSPOSITION Left 11/24/2018   Procedure: SECOND STAGE BASILIC VEIN TRANSPOSITION LEFT ARM;  Surgeon: Waynetta Sandy, MD;  Location: [place];  Service: Vascular;  Laterality: Left;  .  BIOPSY  03/26/2019   Procedure: BIOPSY;  Surgeon: Lavena Bullion, DO;  Location: [place];  Service: Gastroenterology;;  . CARDIAC CATHETERIZATION    . CORONARY ANGIOPLASTY  ?2003/2004  . ESOPHAGOGASTRODUODENOSCOPY (EGD) WITH PROPOFOL N/A 03/26/2019   Procedure: ESOPHAGOGASTRODUODENOSCOPY (EGD) WITH PROPOFOL;  Surgeon: Lavena Bullion, DO;  Location: [place];  Service: Gastroenterology;  Laterality: N/A;  . FACIAL RECONSTRUCTION SURGERY     x 2  . HERNIA REPAIR      Family History  Problem Relation Age of Onset  . Ulcers Sister        sick a long time, improved after surgery    Social History:  reports that she quit smoking about 2 years ago. She has a 25.00 pack-year smoking history. She has never used smokeless tobacco. She reports previous alcohol use. She reports previous drug use. Drug: Marijuana.  Allergies:  Allergies  Allergen Reactions  . Cyclobenzaprine Anaphylaxis and Other (See Comments)    "stopped heart"   . Morphine Anaphylaxis    "stopped heart"  . Penicillins Shortness Of Breath, Swelling and Palpitations    Did it involve swelling of the face/tongue/throat, SOB, or low BP? Yes Did it involve sudden or severe rash/hives, skin peeling, or any reaction on the inside of your mouth or nose? No Did you need to seek medical attention at a hospital or doctor's office? No When did it last  happen?years  If all above answers are "NO", may proceed with cephalosporin use.   . Ambien [Zolpidem] Other (See Comments)    Severe confusion  . Codeine Itching and Rash  . Hydromorphone Other (See Comments)    If administered quickly, felt like hand was "exploding"     Medications: I have reviewed the patient's current medications.  Results for orders placed or performed during the hospital encounter of 04/23/19 (from the past 48 hour(s))  CBG monitoring, ED     Status: Abnormal   Collection Time: 04/23/19 12:32 PM  Result Value Ref Range    Glucose-Capillary 111 (H) 70 - 99 mg/dL    Comment: Glucose reference range applies only to samples taken after fasting for at least 8 hours.  CBC     Status: Abnormal   Collection Time: 04/23/19 12:45 PM  Result Value Ref Range   WBC 9.0 4.0 - 10.5 K/uL   RBC 4.72 3.87 - 5.11 MIL/uL   Hemoglobin 13.2 12.0 - 15.0 g/dL   HCT 43.9 36.0 - 46.0 %   MCV 93.0 80.0 - 100.0 fL   MCH 28.0 26.0 - 34.0 pg   MCHC 30.1 30.0 - 36.0 g/dL   RDW 17.3 (H) 11.5 - 15.5 %   Platelets 265 150 - 400 K/uL   nRBC 0.2 0.0 - 0.2 %    Comment: Performed at [facility], [place] [address], [place], [place] [address]  Comprehensive metabolic panel     Status: Abnormal   Collection Time: 04/23/19 12:45 PM  Result Value Ref Range   Sodium 136 135 - 145 mmol/L   Potassium 4.3 3.5 - 5.1 mmol/L   Chloride 99 98 - 111 mmol/L   CO2 22 22 - 32 mmol/L   Glucose, Bld 117 (H) 70 - 99 mg/dL    Comment: Glucose reference range applies only to samples taken after fasting for at least 8 hours.   BUN 31 (H) 8 - 23 mg/dL   Creatinine, Ser 5.24 (H) 0.44 - 1.00 mg/dL   Calcium 8.8 (L) 8.9 - 10.3 mg/dL   Total Protein 6.5 6.5 - 8.1 g/dL   Albumin 3.0 (L) 3.5 - 5.0 g/dL   AST 19 15 - 41 U/L   ALT 12 0 - 44 U/L   Alkaline Phosphatase 69 38 - 126 U/L   Total Bilirubin 0.5 0.3 - 1.2 mg/dL   GFR calc non Af Amer 8 (L) >60 mL/min   GFR calc Af Amer 9 (L) >60 mL/min   Anion gap 15 5 - 15    Comment: Performed at [place] [address] [place], [place] [address]  Lipase, blood     Status: Abnormal   Collection Time: 04/23/19 12:45 PM  Result Value Ref Range   Lipase 76 (H) 11 - 51 U/L    Comment: Performed at [facility] [address] [place], [place] [address]  Urinalysis, Routine w reflex microscopic     Status: Abnormal   Collection Time: 04/23/19  2:40 PM  Result Value Ref Range   Color, Urine YELLOW YELLOW   APPearance CLEAR CLEAR   Specific Gravity, Urine 1.029 1.005 - 1.030   pH 8.0 5.0 - 8.0    Glucose, UA NEGATIVE NEGATIVE mg/dL   Hgb urine dipstick NEGATIVE NEGATIVE   Bilirubin Urine NEGATIVE NEGATIVE   Ketones, ur NEGATIVE NEGATIVE mg/dL   Protein, ur >=300 (A) NEGATIVE mg/dL   Nitrite NEGATIVE NEGATIVE   Leukocytes,Ua  NEGATIVE NEGATIVE   RBC / HPF 0-5 0 - 5 RBC/hpf   WBC, UA 0-5 0 - 5 WBC/hpf   Bacteria, UA NONE SEEN NONE SEEN   Squamous Epithelial / LPF 0-5 0 - 5    Comment: Performed at [facility], [place] [address], [place], [place] [address]    CT ABDOMEN PELVIS W CONTRAST  Result Date: 04/23/2019 CLINICAL DATA:  Nausea, vomiting, and diarrhea. Tachycardia. EXAM: CT ABDOMEN AND PELVIS WITH CONTRAST 2:19 p.m. TECHNIQUE: Multidetector CT imaging of the abdomen and pelvis was performed using the standard protocol following bolus administration of intravenous contrast. CONTRAST:  162mL OMNIPAQUE IOHEXOL 300 MG/ML  SOLN COMPARISON:  CT scans dated 04/23/2019 at 4:14 a.m. at [facility] and 03/06/2019 and 03/25/2019 FINDINGS: Lower chest: No acute abnormality. Severe atherosclerosis of the distal thoracic aorta. Slight linear scarring at the left lung base, stable. Hepatobiliary, stomach/Bowel: A small amount of fluid surrounds the contracted gallbladder. The gallbladder has contracted slightly since the prior study. The fluid extends around the duodenum just distal to the duodenal bulb. Slight prominence of multiple distal small bowel loops without obstruction. This is nonspecific. Appendix has been removed. There appears to be an 18 mm ulcer protruding posteriorly just distal to the duodenal bulb best seen on image 38 of series 7 and image 25 of series 3 and image 42 of series 6. This was not definable on the prior study done earlier today. Pancreas: Unremarkable. No pancreatic ductal dilatation or surrounding inflammatory changes. Spleen: Normal in size without focal abnormality. Adrenals/Urinary Tract: Bilateral adrenal adenomas, unchanged. Multiple bilateral renal  cysts. No hydronephrosis. Bladder is normal. Vascular/Lymphatic: Extensive aortic atherosclerosis. No adenopathy. Reproductive: Status post hysterectomy. No adnexal masses. Other: No ascites. No abdominal wall hernias. Musculoskeletal: No acute or significant osseous findings. IMPRESSION: 1. Findings consistent with an 18 mm ulcer protruding posteriorly just distal to the duodenal bulb. There is fluid surrounding this ulcer and extending around the gallbladder which could represent impending perforation. 2. Small amount of fluid surrounds the contracted gallbladder. 3. Slight prominence of multiple distal small bowel loops without obstruction. This is nonspecific. 4. Stable bilateral adrenal adenomas. 5. Severe atherosclerosis of the distal thoracic aorta. Aortic Atherosclerosis (ICD10-I70.0). Electronically Signed   By: Lorriane Shire M.D.   On: 04/23/2019 15:09   DG Chest Port 1 View  Result Date: 04/23/2019 CLINICAL DATA:  Abdominal pain EXAM: PORTABLE CHEST 1 VIEW COMPARISON:  Chest radiograph dated 04/11/2019 FINDINGS: The heart is enlarged. Vascular calcifications are seen in the aortic arch. Both lungs are clear. The visualized skeletal structures are unremarkable. A right internal jugular central venous catheter tip overlies the right atrium. IMPRESSION: No active disease. Electronically Signed   By: Zerita Boers M.D.   On: 04/23/2019 13:39    Review of Systems  Constitutional: Negative.   HENT: Negative.   Eyes: Negative.   Respiratory: Negative.   Cardiovascular: Positive for palpitations.  Gastrointestinal: Positive for abdominal pain and diarrhea.  Endocrine: Negative.   Genitourinary:       Dialysis  Musculoskeletal: Negative.   Skin: Negative.   Allergic/Immunologic: Negative.   Neurological: Negative.   Hematological: Negative.   Psychiatric/Behavioral: Negative.    Blood pressure (!) 169/71, pulse 81, temperature 98.8 F (37.1 C), temperature source Oral, resp. rate 16, SpO2  100 %. Physical Exam  Constitutional: She is oriented to person, place, and time. She appears well-developed and well-nourished. No distress.  HENT:  Head: Normocephalic.  Right Ear: External ear normal.  Left Ear: External ear normal.  Mouth/Throat: Oropharynx is clear and moist.  Eyes: Pupils are equal, round, and reactive to light. EOM are normal. Right eye exhibits no discharge. Left eye exhibits no discharge. No scleral icterus.  Neck: No tracheal deviation present. No thyromegaly present.  Cardiovascular: Normal rate, regular rhythm and normal heart sounds.  Respiratory: Effort normal and breath sounds normal. No respiratory distress. She has no wheezes. She has no rales.  GI: Soft. She exhibits no distension. There is abdominal tenderness. There is no rebound and no guarding.  Tender more in the upper abdomen without frank peritonitis, hypoactive bowel sounds, no hepatosplenomegaly  Musculoskeletal:        General: No edema.  Neurological: She is alert and oriented to person, place, and time. She exhibits normal muscle tone.  Skin: Skin is warm and dry.  Psychiatric: She has a normal mood and affect.  Alert and oriented    Assessment/Plan: Duodenal ulcer - recommend NPO, medical management, GI consult. No need for emergent surgery. We will follow.  Zenovia Jarred 04/23/2019, 4:36 PM

## 2019-04-23 NOTE — Consult Note (Signed)
Reason for Consult: Continuity of ESRD care Referring Physician: Karmen Bongo, MD  HPI:  69 year old Caucasian woman with past medical history significant for chronic obstructive lung disease, atrial fibrillation, depression/anxiety, coronary artery disease status post PTCA, hypertension, dyslipidemia, known history of duodenal ulcer and end-stage renal disease on hemodialysis on a TTS schedule at [facility].  She has had recent admissions for epigastric pain with recent endoscopy showing esophagitis, gastritis and multiple DUs.  She presented to the emergency room today with increased epigastric/umbilical abdominal pain exacerbated by certain meals.  She denies any fever or chills and did not have any cough or sputum production prior to presentation.  She has chronic dark stools without any hematochezia or hematemesis.  She reports an episode of vomiting yesterday.  She had a CT scan of the abdomen and pelvis with contrast that showed an 18 mm ulcer protruding posteriorly just distal to the duodenal bulb with findings suggestive of impending perforation.  Hemodialysis prescription: TTS, [facility], blood flow rate 300, AF 1.5, 2K/2.0 calcium, 4 hours, RIJ TDC, EDW 40.5 kg, no heparin, Mircera 150 mcg every 2 weeks (last dose 3/23), Venofer 50 mg once a week, calcitriol 1 mcg q. hemodialysis,  Past Medical History:  Diagnosis Date  . Anemia   . Anxiety   . Arthritis   . Asthma   . Atrial fibrillation ([place])   . Chronic kidney disease    Dialysis T/Th/Sa  started in March 2020  . COPD (chronic obstructive pulmonary disease) ([place])   . Coronary artery disease    2 stents  . Depression   . GERD (gastroesophageal reflux disease)   . Gout   . Headache    migraines  . History of kidney stones   . Hyperlipidemia   . Hypertension   . Pneumonia   . Small bowel obstruction [facility])     Past Surgical History:  Procedure Laterality Date  . ABDOMINAL HYSTERECTOMY    . ABDOMINAL  SURGERY     for small bowel obstruction - x 2  . APPENDECTOMY    . AV FISTULA PLACEMENT Left 08/04/2018   Procedure: ARTERIOVENOUS (AV) FISTULA CREATION LEFT ARM;  Surgeon: Waynetta Sandy, MD;  Location: [place];  Service: Vascular;  Laterality: Left;  . BASCILIC VEIN TRANSPOSITION Left 11/24/2018   Procedure: SECOND STAGE BASILIC VEIN TRANSPOSITION LEFT ARM;  Surgeon: Waynetta Sandy, MD;  Location: [place];  Service: Vascular;  Laterality: Left;  . BIOPSY  03/26/2019   Procedure: BIOPSY;  Surgeon: Lavena Bullion, DO;  Location: [place];  Service: Gastroenterology;;  . CARDIAC CATHETERIZATION    . CORONARY ANGIOPLASTY  ?2003/2004  . ESOPHAGOGASTRODUODENOSCOPY (EGD) WITH PROPOFOL N/A 03/26/2019   Procedure: ESOPHAGOGASTRODUODENOSCOPY (EGD) WITH PROPOFOL;  Surgeon: Lavena Bullion, DO;  Location: [place];  Service: Gastroenterology;  Laterality: N/A;  . FACIAL RECONSTRUCTION SURGERY     x 2  . HERNIA REPAIR      Family History  Problem Relation Age of Onset  . Ulcers Sister        sick a long time, improved after surgery    Social History:  reports that she quit smoking about 2 years ago. She has a 25.00 pack-year smoking history. She has never used smokeless tobacco. She reports previous alcohol use. She reports previous drug use. Drug: Marijuana.  Allergies:  Allergies  Allergen Reactions  . Cyclobenzaprine Anaphylaxis and Other (See Comments)    "stopped heart"   . Morphine Anaphylaxis    "stopped heart"  .  Penicillins Shortness Of Breath, Swelling and Palpitations    Did it involve swelling of the face/tongue/throat, SOB, or low BP? Yes Did it involve sudden or severe rash/hives, skin peeling, or any reaction on the inside of your mouth or nose? No Did you need to seek medical attention at a hospital or doctor's office? No When did it last happen?years  If all above answers are "NO", may proceed with cephalosporin use.   . Ambien  [Zolpidem] Other (See Comments)    Severe confusion  . Codeine Itching and Rash  . Hydromorphone Other (See Comments)    If administered quickly, felt like hand was "exploding"     Medications:  Scheduled: . metoprolol tartrate  5 mg Intravenous Q8H  . mometasone-formoterol  2 puff Inhalation BID  . [START ON 04/27/2019] pantoprazole  40 mg Intravenous Q12H  . sodium chloride flush  3 mL Intravenous Q12H    BMP Latest Ref Rng & Units 04/23/2019 04/04/2019 03/30/2019  Glucose 70 - 99 mg/dL 117(H) 82 102(H)  BUN 8 - 23 mg/dL 31(H) 20 7(L)  Creatinine 0.44 - 1.00 mg/dL 5.24(H) 5.14(H) 2.54(H)  Sodium 135 - 145 mmol/L 136 135 133(L)  Potassium 3.5 - 5.1 mmol/L 4.3 4.5 3.9  Chloride 98 - 111 mmol/L 99 103 99  CO2 22 - 32 mmol/L 22 22 26   Calcium 8.9 - 10.3 mg/dL 8.8(L) 8.4(L) 7.7(L)   CBC Latest Ref Rng & Units 04/23/2019 04/04/2019 03/30/2019  WBC 4.0 - 10.5 K/uL 9.0 9.2 8.2  Hemoglobin 12.0 - 15.0 g/dL 13.2 9.1(L) 8.9(L)  Hematocrit 36.0 - 46.0 % 43.9 29.6(L) 27.8(L)  Platelets 150 - 400 K/uL 265 240 184    CT ABDOMEN PELVIS W CONTRAST  Result Date: 04/23/2019 CLINICAL DATA:  Nausea, vomiting, and diarrhea. Tachycardia. EXAM: CT ABDOMEN AND PELVIS WITH CONTRAST 2:19 p.m. TECHNIQUE: Multidetector CT imaging of the abdomen and pelvis was performed using the standard protocol following bolus administration of intravenous contrast. CONTRAST:  164mL OMNIPAQUE IOHEXOL 300 MG/ML  SOLN COMPARISON:  CT scans dated 04/23/2019 at 4:14 a.m. at [facility] and 03/06/2019 and 03/25/2019 FINDINGS: Lower chest: No acute abnormality. Severe atherosclerosis of the distal thoracic aorta. Slight linear scarring at the left lung base, stable. Hepatobiliary, stomach/Bowel: A small amount of fluid surrounds the contracted gallbladder. The gallbladder has contracted slightly since the prior study. The fluid extends around the duodenum just distal to the duodenal bulb. Slight prominence of multiple distal small  bowel loops without obstruction. This is nonspecific. Appendix has been removed. There appears to be an 18 mm ulcer protruding posteriorly just distal to the duodenal bulb best seen on image 38 of series 7 and image 25 of series 3 and image 42 of series 6. This was not definable on the prior study done earlier today. Pancreas: Unremarkable. No pancreatic ductal dilatation or surrounding inflammatory changes. Spleen: Normal in size without focal abnormality. Adrenals/Urinary Tract: Bilateral adrenal adenomas, unchanged. Multiple bilateral renal cysts. No hydronephrosis. Bladder is normal. Vascular/Lymphatic: Extensive aortic atherosclerosis. No adenopathy. Reproductive: Status post hysterectomy. No adnexal masses. Other: No ascites. No abdominal wall hernias. Musculoskeletal: No acute or significant osseous findings. IMPRESSION: 1. Findings consistent with an 18 mm ulcer protruding posteriorly just distal to the duodenal bulb. There is fluid surrounding this ulcer and extending around the gallbladder which could represent impending perforation. 2. Small amount of fluid surrounds the contracted gallbladder. 3. Slight prominence of multiple distal small bowel loops without obstruction. This is nonspecific. 4. Stable bilateral adrenal adenomas.  5. Severe atherosclerosis of the distal thoracic aorta. Aortic Atherosclerosis (ICD10-I70.0). Electronically Signed   By: Lorriane Shire M.D.   On: 04/23/2019 15:09   DG Chest Port 1 View  Result Date: 04/23/2019 CLINICAL DATA:  Abdominal pain EXAM: PORTABLE CHEST 1 VIEW COMPARISON:  Chest radiograph dated 04/11/2019 FINDINGS: The heart is enlarged. Vascular calcifications are seen in the aortic arch. Both lungs are clear. The visualized skeletal structures are unremarkable. A right internal jugular central venous catheter tip overlies the right atrium. IMPRESSION: No active disease. Electronically Signed   By: Zerita Boers M.D.   On: 04/23/2019 13:39    Review of Systems   Constitutional: Positive for chills and fatigue. Negative for fever.  HENT: Positive for congestion. Negative for nosebleeds, sore throat and trouble swallowing.   Eyes: Negative.   Respiratory: Negative.   Cardiovascular: Negative.   Gastrointestinal: Positive for abdominal pain, diarrhea, nausea and vomiting.       Chronic dark stools  Endocrine: Positive for cold intolerance. Negative for heat intolerance.  Genitourinary: Negative.   Musculoskeletal: Negative.   Skin: Negative.   Neurological: Negative for dizziness, light-headedness and headaches.   Blood pressure (!) 169/71, pulse 81, temperature 98.8 F (37.1 C), temperature source Oral, resp. rate 16, SpO2 100 %. Physical Exam  Nursing note and vitals reviewed. Constitutional: She is oriented to person, place, and time. She appears well-developed. No distress.  Appears cachectic and chronically ill  HENT:  Head: Normocephalic and atraumatic.  Nose: Nose normal.  Mouth/Throat: Oropharynx is clear and moist.  Eyes: Pupils are equal, round, and reactive to light. Conjunctivae are normal. No scleral icterus.  Neck: No JVD present.  Cardiovascular: Normal rate, regular rhythm and normal heart sounds.  Respiratory: Effort normal and breath sounds normal. She has no wheezes. She has no rales.  Right IJ TDC  GI: Soft. Bowel sounds are normal. There is abdominal tenderness. There is guarding.  Epigastric/periumbilical tenderness with some guarding  Musculoskeletal:        General: No edema. Normal range of motion.     Cervical back: Normal range of motion and neck supple.  Neurological: She is alert and oriented to person, place, and time.  Skin: Skin is warm and dry. No rash noted.    Assessment/Plan: 1.  Duodenal ulcer: Symptomatic with increased epigastric pain and radiological evidence suggestive of impending rupture.  She has been evaluated earlier by surgery and the recommendations are to keep her n.p.o. with maximization  of medical management/PPI therapy and GI consult.  She does not have a surgical abdomen or indications for emergent surgery. 2.  End-stage renal disease: Usually on a TTS schedule as an outpatient and will undertake hemodialysis tomorrow due to scheduling/staffing barriers.  She does not have any acute indications for dialysis at this time from a volume or electrolyte standpoint. 3.  Hypertension: Prior history significant for poor adherence with antihypertensive therapy as well as substance abuse including cocaine use.  Current blood pressures likely exacerbated by pain.  Monitor with hemodialysis. 4.  Anemia: Secondary to chronic kidney disease with hemoglobin and hematocrit currently at goal. 5.  Secondary hyperparathyroidism: Calcium level acceptable, will follow up with phosphorus level on labs tomorrow.  Currently n.p.o.-no binders.  Will resume calcitriol with dialysis. 6.  Nutrition: N.p.o. at this time,  Kory Rains K. 04/23/2019, 5:39 PM

## 2019-04-24 ENCOUNTER — Other Ambulatory Visit: Payer: Self-pay

## 2019-04-24 DIAGNOSIS — R935 Abnormal findings on diagnostic imaging of other abdominal regions, including retroperitoneum: Secondary | ICD-10-CM

## 2019-04-24 DIAGNOSIS — K265 Chronic or unspecified duodenal ulcer with perforation: Secondary | ICD-10-CM

## 2019-04-24 DIAGNOSIS — K551 Chronic vascular disorders of intestine: Principal | ICD-10-CM

## 2019-04-24 DIAGNOSIS — R1010 Upper abdominal pain, unspecified: Secondary | ICD-10-CM

## 2019-04-24 DIAGNOSIS — K920 Hematemesis: Secondary | ICD-10-CM

## 2019-04-24 LAB — CBC
HCT: 36.6 % (ref 36.0–46.0)
Hemoglobin: 11 g/dL — ABNORMAL LOW (ref 12.0–15.0)
MCH: 27.7 pg (ref 26.0–34.0)
MCHC: 30.1 g/dL (ref 30.0–36.0)
MCV: 92.2 fL (ref 80.0–100.0)
Platelets: 283 10*3/uL (ref 150–400)
RBC: 3.97 MIL/uL (ref 3.87–5.11)
RDW: 17.4 % — ABNORMAL HIGH (ref 11.5–15.5)
WBC: 7.7 10*3/uL (ref 4.0–10.5)
nRBC: 0 % (ref 0.0–0.2)

## 2019-04-24 LAB — PHOSPHORUS: Phosphorus: 6.3 mg/dL — ABNORMAL HIGH (ref 2.5–4.6)

## 2019-04-24 LAB — HEMOGLOBIN AND HEMATOCRIT, BLOOD
HCT: 41.2 % (ref 36.0–46.0)
Hemoglobin: 12.4 g/dL (ref 12.0–15.0)

## 2019-04-24 MED ORDER — AMLODIPINE BESYLATE 10 MG PO TABS
10.0000 mg | ORAL_TABLET | Freq: Every day | ORAL | Status: DC
  Administered 2019-04-24 – 2019-04-28 (×4): 10 mg via ORAL
  Filled 2019-04-24 (×5): qty 1

## 2019-04-24 MED ORDER — METOPROLOL TARTRATE 100 MG PO TABS
100.0000 mg | ORAL_TABLET | Freq: Two times a day (BID) | ORAL | Status: DC
  Administered 2019-04-24 – 2019-04-29 (×10): 100 mg via ORAL
  Filled 2019-04-24 (×11): qty 1

## 2019-04-24 MED ORDER — SUCRALFATE 1 GM/10ML PO SUSP
1.0000 g | Freq: Four times a day (QID) | ORAL | Status: DC
  Administered 2019-04-24 – 2019-04-27 (×11): 1 g via ORAL
  Filled 2019-04-24 (×10): qty 10

## 2019-04-24 MED ORDER — HEPARIN SODIUM (PORCINE) 1000 UNIT/ML IJ SOLN
INTRAMUSCULAR | Status: AC
  Filled 2019-04-24: qty 4

## 2019-04-24 MED ORDER — LIDOCAINE-PRILOCAINE 2.5-2.5 % EX CREA
1.0000 "application " | TOPICAL_CREAM | Freq: Every day | CUTANEOUS | Status: DC | PRN
  Filled 2019-04-24: qty 5

## 2019-04-24 MED ORDER — ONDANSETRON HCL 4 MG/2ML IJ SOLN
INTRAMUSCULAR | Status: AC
  Administered 2019-04-24: 4 mg
  Filled 2019-04-24: qty 2

## 2019-04-24 NOTE — Progress Notes (Signed)
Patient ID: Christina [name], female   DOB: 1950-03-04, 69 y.o.   MRN: [id number]   Acute Care Surgery Service Progress Note:    Chief Complaint/Subjective: Feels better  Denies abd pain States she still as tenderness Will sometimes get abd pain with HD  Objective: Vital signs in last 24 hours: Temp:  [96.8 F (36 C)-98.8 F (37.1 C)] 96.8 F (36 C) (03/28 0908) Pulse Rate:  [55-149] 75 (03/28 0908) Resp:  [12-33] 15 (03/28 0908) BP: (132-191)/(64-115) 164/83 (03/28 0908) SpO2:  [86 %-100 %] 100 % (03/28 0908) Weight:  [41.2 kg] 41.2 kg (03/28 0511)    Intake/Output from previous day: 03/27 0701 - 03/28 0700 In: 588.7 [I.V.:88.7; IV Piggyback:500] Out: -  Intake/Output this shift: No intake/output data recorded.  Lungs: cta, nonlabored  Cardiovascular: reg  Abd: soft, nd, some upper TTP but no rebound/guarding  Extremities: no edema, +SCDs  Neuro: alert, nonfocal  Gen: appears older than stated age; nontoxic  Lab Results: CBC  Recent Labs    04/23/19 1245 04/24/19 0010  WBC 9.0 7.7  HGB 13.2 11.0*  HCT 43.9 36.6  PLT 265 283   BMET Recent Labs    04/23/19 1245  NA 136  K 4.3  CL 99  CO2 22  GLUCOSE 117*  BUN 31*  CREATININE 5.24*  CALCIUM 8.8*   LFT Hepatic Function Latest Ref Rng & Units 04/23/2019 04/04/2019 03/26/2019  Total Protein 6.5 - 8.1 g/dL 6.5 5.0(L) 4.4(L)  Albumin 3.5 - 5.0 g/dL 3.0(L) 2.3(L) 2.2(L)  AST 15 - 41 U/L 19 17 33  ALT 0 - 44 U/L '12 15 25  ' Alk Phosphatase 38 - 126 U/L 69 83 49  Total Bilirubin 0.3 - 1.2 mg/dL 0.5 0.7 0.8   PT/INR No results for input(s): LABPROT, INR in the last 72 hours. ABG No results for input(s): PHART, HCO3 in the last 72 hours.  Invalid input(s): PCO2, PO2  Studies/Results:  Anti-infectives: Anti-infectives (From admission, onward)   None      Medications: Scheduled Meds: . amLODipine  10 mg Oral QHS  . Chlorhexidine Gluconate Cloth  6 each Topical Q0600  . metoprolol tartrate  100  mg Oral BID  . mometasone-formoterol  2 puff Inhalation BID  . [START ON 04/27/2019] pantoprazole  40 mg Intravenous Q12H  . sodium chloride flush  3 mL Intravenous Q12H  . sucralfate  1 g Oral Q6H   Continuous Infusions: . pantoprozole (PROTONIX) infusion 8 mg/hr (04/24/19 0605)   PRN Meds:.acetaminophen **OR** acetaminophen, camphor-menthol **AND** hydrOXYzine, ipratropium-albuterol, lidocaine-prilocaine, ondansetron **OR** ondansetron (ZOFRAN) IV, polyvinyl alcohol  Assessment/Plan: Patient Active Problem List   Diagnosis Date Noted  . Duodenal ulcer perforation ([place]) 04/23/2019  . Cocaine abuse ([place]) 04/23/2019  . Acute gastric ulcer without hemorrhage or perforation   . Hematemesis 04/04/2019  . Atrial fibrillation with RVR ([place])   . Duodenal ulcer   . Gastritis and gastroduodenitis   . Acute blood loss anemia   . Ischemic bowel disease ([place]) 03/25/2019  . RUQ pain   . Anemia due to chronic kidney disease, on chronic dialysis ([place])   . AF (paroxysmal atrial fibrillation) ([place])   . Chest pain 03/19/2019  . Hyperkalemia 02/28/2019  . Uremia 02/27/2019  . Volume overload 02/21/2019  . Abdominal pain 02/21/2019  . Non-compliance with renal dialysis ([place]) 02/21/2019  . Respiratory failure ([place]) 02/04/2019  . Acute and chr resp failure, unsp w hypoxia or hypercapnia ([place])   . Afib ([place]) 01/25/2019  . SBO (  small bowel obstruction) ([place]) 08/10/2018  . Anemia due to chronic blood loss   . Heme positive stool   . Platelet inhibition due to Plavix   . Acute GI bleeding 05/06/2018  . ESRD (end stage renal disease) ([place]) 05/06/2018  . Anxiety 05/06/2018  . Bipolar affective ([place]) 05/06/2018  . CAD (coronary artery disease), native coronary artery 05/06/2018  . COPD with chronic bronchitis ([place]) 05/06/2018  . CVA (cerebral vascular accident) ([place]) 05/06/2018  . Essential hypertension 05/06/2018  . Hyperlipidemia 05/06/2018   ESRD on HD CAD HTN A fib COPD H/o SBO Severe  abdominal atherosclerotic disease Duodenal ulcers Protein calorie malnutrition  No fever. Not rigid. Wbc ok.  Feels better Will allow clears Add back carafate Cont protonix Not sure if misoprostol would be of any benefit since duodenal ulcer - defer to GI medicine  Disposition: CLD  LOS: 1 day    Leighton Ruff. Redmond Pulling, MD, FACS General, Bariatric, & Minimally Invasive Surgery [phone] [facility]

## 2019-04-24 NOTE — Progress Notes (Signed)
PROGRESS NOTE    Christina [name]  GEZ:[id number] DOB: March 31, 1950 DOA: 04/23/2019 PCP: Maggie Schwalbe, PA-C   Brief Narrative:  Patient is a 69 year old female with history of SBO, hypertension, hyperlipidemia, depression, coronary disease with stents, COPD, ESRD on TTS schedule dialysis, paroxysmal A. fib who presents with complaints of abdominal pain.  She was admitted last time on 2/26 and was discharged on 3/3.  In that hospitalization, she underwent EGD which showed gastritis, esophagitis, multiple duodenal ulcers.  She was started on PPI and Carafate and she was taking them.CT abdomen on this admission showed duodenal  ulcer, close to perforation.  GI, general surgery consulted.  Started on Protonix drip  Assessment & Plan:   Principal Problem:   Duodenal ulcer perforation ([place]) Active Problems:   ESRD (end stage renal disease) ([place])   Essential hypertension   Hyperlipidemia   Abdominal pain   AF (paroxysmal atrial fibrillation) ([facility])   Cocaine abuse ([facility])    Duodenal Ulcer: CT Findings consistent with an 18 mm ulcer protruding posteriorly just distal to the duodenal bulb. Possibility of  impending perforation. She has been admitted multiple times for abdominal pain, GI bleed requiring transfusion in the last 2 months.  EGD on 2/27 performed by Dr. Bryan Lemma showed grade B esophagitis without bleeding, 2 cm hiatal hernia, mild gastritis, multiple ulcers in the duodenal bulb. The largest ulcer was 15 mm and deeply cratered; it was too large for endoscopic intervention and was recommended for IR or surgical consultation if bleeding recurred.  These findings were concerning for ischemic etiology. Biopsies didnt show malignancy. Patient on twice daily PPI and Carafate and she is compliant with that.  General surgery, GI consulted. Continue Protonix drip.  We will keep her n.p.o.  A. fib with RVR: Presented with A. fib with RVR.  History of proximal A. fib.  Currently heart rate  is well controlled.  Poor candidate for anticoagulation due to large persistent duodenal ulcer.  On metoprolol for rate control.  ESRD on dialysis: Dialyzed on TTS schedule.  Nephrology following.  Coronary artery disease: Denies any chest pain at present.  Continue current management  Hypertension: Hypertensive this morning.  Continue her home meds  History of COPD: Not on exacerbation at present.  Continue Combivent, Symbicort  Hyperlipidemia: On Pravachol at home.  Cocaine abuse: UDS positive for cocaine on 2/26.  Counseled for cessation           DVT prophylaxis:SCD Code Status: Full Family Communication: None present at bedside.  Patient communicating with her family Disposition Plan: Patient is from home.  Waiting for full work-up.  Needs GI , surgical clearance before discharge.   Consultants: GI,general surgery,Nephrology  Procedures:None  Antimicrobials:  Anti-infectives (From admission, onward)   None      Subjective: Patient seen and examined at the bedside this morning.  Hemodynamically stable.  Abdominal  pain has much improved this morning.  Hypertensive.  Objective: Vitals:   04/23/19 2200 04/23/19 2302 04/24/19 0000 04/24/19 0511  BP:  (!) 176/79 (!) 178/77 (!) 185/77  Pulse:  78  75  Resp:  (!) 28 17 16   Temp: 97.7 F (36.5 C) 97.8 F (36.6 C)  97.6 F (36.4 C)  TempSrc:  Oral  Oral  SpO2:  100%  99%  Weight:  41.2 kg  41.2 kg  Height:    5\' 2"  (1.575 m)    Intake/Output Summary (Last 24 hours) at 04/24/2019 0806 Last data filed at 04/24/2019 6503 Gross per 24  hour  Intake 588.7 ml  Output --  Net 588.7 ml   Filed Weights   04/23/19 2302 04/24/19 0511  Weight: 41.2 kg 41.2 kg    Examination:  General exam: Comfortable Respiratory system:no wheezes or crackles  Cardiovascular system: NSR. No JVD, murmurs, rubs, gallops or clicks. No pedal edema. Gastrointestinal system: Abdomen is diffusely tender, soft and nontender. No  organomegaly or masses felt. Normal bowel sounds heard. Central nervous system: Alert and oriented. No focal neurological deficits.  Temporary dialysis catheter on the right chest Extremities: No edema, no clubbing ,no cyanosis Skin: No rashes, lesions or ulcers,no icterus ,no pallor    Data Reviewed: I have personally reviewed following labs and imaging studies  CBC: Recent Labs  Lab 04/23/19 1245 04/24/19 0010  WBC 9.0 7.7  HGB 13.2 11.0*  HCT 43.9 36.6  MCV 93.0 92.2  PLT 265 767   Basic Metabolic Panel: Recent Labs  Lab 04/23/19 1245  NA 136  K 4.3  CL 99  CO2 22  GLUCOSE 117*  BUN 31*  CREATININE 5.24*  CALCIUM 8.8*   GFR: Estimated Creatinine Clearance: 6.7 mL/min (A) (by C-G formula based on SCr of 5.24 mg/dL (H)). Liver Function Tests: Recent Labs  Lab 04/23/19 1245  AST 19  ALT 12  ALKPHOS 69  BILITOT 0.5  PROT 6.5  ALBUMIN 3.0*   Recent Labs  Lab 04/23/19 1245  LIPASE 76*   No results for input(s): AMMONIA in the last 168 hours. Coagulation Profile: No results for input(s): INR, PROTIME in the last 168 hours. Cardiac Enzymes: No results for input(s): CKTOTAL, CKMB, CKMBINDEX, TROPONINI in the last 168 hours. BNP (last 3 results) No results for input(s): PROBNP in the last 8760 hours. HbA1C: No results for input(s): HGBA1C in the last 72 hours. CBG: Recent Labs  Lab 04/23/19 1232  GLUCAP 111*   Lipid Profile: No results for input(s): CHOL, HDL, LDLCALC, TRIG, CHOLHDL, LDLDIRECT in the last 72 hours. Thyroid Function Tests: No results for input(s): TSH, T4TOTAL, FREET4, T3FREE, THYROIDAB in the last 72 hours. Anemia Panel: No results for input(s): VITAMINB12, FOLATE, FERRITIN, TIBC, IRON, RETICCTPCT in the last 72 hours. Sepsis Labs: No results for input(s): PROCALCITON, LATICACIDVEN in the last 168 hours.  Recent Results (from the past 240 hour(s))  Respiratory Panel by RT PCR (Flu A&B, Covid) - Nasopharyngeal Swab     Status: None     Collection Time: 04/23/19  5:55 PM   Specimen: Nasopharyngeal Swab  Result Value Ref Range Status   SARS Coronavirus 2 by RT PCR NEGATIVE NEGATIVE Final    Comment: (NOTE) SARS-CoV-2 target nucleic acids are NOT DETECTED. The SARS-CoV-2 RNA is generally detectable in upper respiratoy specimens during the acute phase of infection. The lowest concentration of SARS-CoV-2 viral copies this assay can detect is 131 copies/mL. A negative result does not preclude SARS-Cov-2 infection and should not be used as the sole basis for treatment or other patient management decisions. A negative result may occur with  improper specimen collection/handling, submission of specimen other than nasopharyngeal swab, presence of viral mutation(s) within the areas targeted by this assay, and inadequate number of viral copies (<131 copies/mL). A negative result must be combined with clinical observations, patient history, and epidemiological information. The expected result is Negative. Fact Sheet for Patients:  PinkCheek.be Fact Sheet for Healthcare Providers:  GravelBags.it This test is not yet ap proved or cleared by the [place] FDA and  has been authorized for detection and/or diagnosis  of SARS-CoV-2 by FDA under an Emergency Use Authorization (EUA). This EUA will remain  in effect (meaning this test can be used) for the duration of the COVID-19 declaration under Section 564(b)(1) of the Act, 21 U.S.C. section 360bbb-3(b)(1), unless the authorization is terminated or revoked sooner.    Influenza A by PCR NEGATIVE NEGATIVE Final   Influenza B by PCR NEGATIVE NEGATIVE Final    Comment: (NOTE) The Xpert Xpress SARS-CoV-2/FLU/RSV assay is intended as an aid in  the diagnosis of influenza from Nasopharyngeal swab specimens and  should not be used as a sole basis for treatment. Nasal washings and  aspirates are unacceptable for Xpert Xpress  SARS-CoV-2/FLU/RSV  testing. Fact Sheet for Patients: PinkCheek.be Fact Sheet for Healthcare Providers: GravelBags.it This test is not yet approved or cleared by the [place] FDA and  has been authorized for detection and/or diagnosis of SARS-CoV-2 by  FDA under an Emergency Use Authorization (EUA). This EUA will remain  in effect (meaning this test can be used) for the duration of the  Covid-19 declaration under Section 564(b)(1) of the Act, 21  U.S.C. section 360bbb-3(b)(1), unless the authorization is  terminated or revoked. Performed at [facility], [place] [address], [place], [place] [address]          Radiology Studies: CT ABDOMEN PELVIS W CONTRAST  Result Date: 04/23/2019 CLINICAL DATA:  Nausea, vomiting, and diarrhea. Tachycardia. EXAM: CT ABDOMEN AND PELVIS WITH CONTRAST 2:19 p.m. TECHNIQUE: Multidetector CT imaging of the abdomen and pelvis was performed using the standard protocol following bolus administration of intravenous contrast. CONTRAST:  128mL OMNIPAQUE IOHEXOL 300 MG/ML  SOLN COMPARISON:  CT scans dated 04/23/2019 at 4:14 a.m. at [facility] and 03/06/2019 and 03/25/2019 FINDINGS: Lower chest: No acute abnormality. Severe atherosclerosis of the distal thoracic aorta. Slight linear scarring at the left lung base, stable. Hepatobiliary, stomach/Bowel: A small amount of fluid surrounds the contracted gallbladder. The gallbladder has contracted slightly since the prior study. The fluid extends around the duodenum just distal to the duodenal bulb. Slight prominence of multiple distal small bowel loops without obstruction. This is nonspecific. Appendix has been removed. There appears to be an 18 mm ulcer protruding posteriorly just distal to the duodenal bulb best seen on image 38 of series 7 and image 25 of series 3 and image 42 of series 6. This was not definable on the prior study done earlier today.  Pancreas: Unremarkable. No pancreatic ductal dilatation or surrounding inflammatory changes. Spleen: Normal in size without focal abnormality. Adrenals/Urinary Tract: Bilateral adrenal adenomas, unchanged. Multiple bilateral renal cysts. No hydronephrosis. Bladder is normal. Vascular/Lymphatic: Extensive aortic atherosclerosis. No adenopathy. Reproductive: Status post hysterectomy. No adnexal masses. Other: No ascites. No abdominal wall hernias. Musculoskeletal: No acute or significant osseous findings. IMPRESSION: 1. Findings consistent with an 18 mm ulcer protruding posteriorly just distal to the duodenal bulb. There is fluid surrounding this ulcer and extending around the gallbladder which could represent impending perforation. 2. Small amount of fluid surrounds the contracted gallbladder. 3. Slight prominence of multiple distal small bowel loops without obstruction. This is nonspecific. 4. Stable bilateral adrenal adenomas. 5. Severe atherosclerosis of the distal thoracic aorta. Aortic Atherosclerosis (ICD10-I70.0). Electronically Signed   By: Lorriane Shire M.D.   On: 04/23/2019 15:09   DG Chest Port 1 View  Result Date: 04/23/2019 CLINICAL DATA:  Abdominal pain EXAM: PORTABLE CHEST 1 VIEW COMPARISON:  Chest radiograph dated 04/11/2019 FINDINGS: The heart is enlarged. Vascular calcifications are seen in the  aortic arch. Both lungs are clear. The visualized skeletal structures are unremarkable. A right internal jugular central venous catheter tip overlies the right atrium. IMPRESSION: No active disease. Electronically Signed   By: Zerita Boers M.D.   On: 04/23/2019 13:39        Scheduled Meds: . Chlorhexidine Gluconate Cloth  6 each Topical Q0600  . metoprolol tartrate  5 mg Intravenous Q8H  . mometasone-formoterol  2 puff Inhalation BID  . [START ON 04/27/2019] pantoprazole  40 mg Intravenous Q12H  . sodium chloride flush  3 mL Intravenous Q12H   Continuous Infusions: . pantoprozole  (PROTONIX) infusion 8 mg/hr (04/24/19 0605)     LOS: 1 day    Time spent: 35 mins.More than 50% of that time was spent in counseling and/or coordination of care.      Shelly Coss, MD [facility] P3/28/2021, 8:06 AM

## 2019-04-24 NOTE — Progress Notes (Signed)
Admit: 04/23/2019 LOS: 1  28F ESRD THS at [facility] with epigastric pain and imaging concerning for large high risk duodenal ulcer  Subjective:  . NO c/o this AM . For HD today    Hemodialysis prescription: TTS, [facility], blood flow rate 300, AF 1.5, 2K/2.0 calcium, 4 hours, RIJ TDC, EDW 40.5 kg, no heparin, Mircera 150 mcg every 2 weeks (last dose 3/23), Venofer 50 mg once a week, calcitriol 1 mcg q. hemodialysis,  03/27 0701 - 03/28 0700 In: 588.7 [I.V.:88.7; IV Piggyback:500] Out: -   Filed Weights   04/23/19 2302 04/24/19 0511  Weight: 41.2 kg 41.2 kg    Scheduled Meds: . amLODipine  10 mg Oral QHS  . Chlorhexidine Gluconate Cloth  6 each Topical Q0600  . metoprolol tartrate  100 mg Oral BID  . mometasone-formoterol  2 puff Inhalation BID  . [START ON 04/27/2019] pantoprazole  40 mg Intravenous Q12H  . sodium chloride flush  3 mL Intravenous Q12H   Continuous Infusions: . pantoprozole (PROTONIX) infusion 8 mg/hr (04/24/19 0605)   PRN Meds:.acetaminophen **OR** acetaminophen, camphor-menthol **AND** hydrOXYzine, ipratropium-albuterol, lidocaine-prilocaine, ondansetron **OR** ondansetron (ZOFRAN) IV, polyvinyl alcohol  Current Labs: reviewed    Physical Exam:  Blood pressure (!) 185/77, pulse 75, temperature 97.6 F (36.4 C), temperature source Oral, resp. rate 16, height 5\' 2"  (1.575 m), weight 41.2 kg, SpO2 99 %. NAD, chronically ill appearing RRR nl s1s2 R IJ TDC c/d/i, no erythema or drainage from exit site Mild TTP in epigastric region CTAB No sig LEE Nonfocal EOMI NCAT  A 1. ESRD THS  TDC 2. Duodenal ulcer, GI and CCS following, no immediate operative intervention required 3. HTN: elevated, see how responds with HD today 4. Anemia: Hb stable 5. CKD-BMD: Ca ok, check P  P . HD today then back on THS scheudle; no heparin . Medication Issues; o Preferred narcotic agents for pain control are hydromorphone, fentanyl, and methadone.  Morphine should not be used.  o Baclofen should be avoided o Avoid oral sodium phosphate and magnesium citrate based laxatives / bowel preps    Pearson Grippe MD 04/24/2019, 9:05 AM  Recent Labs  Lab 04/23/19 1245  NA 136  K 4.3  CL 99  CO2 22  GLUCOSE 117*  BUN 31*  CREATININE 5.24*  CALCIUM 8.8*   Recent Labs  Lab 04/23/19 1245 04/24/19 0010  WBC 9.0 7.7  HGB 13.2 11.0*  HCT 43.9 36.6  MCV 93.0 92.2  PLT 265 283

## 2019-04-24 NOTE — Consult Note (Addendum)
Referring Provider:  Dr. Shelly Coss Primary Care Physician:  Maggie Schwalbe, PA-C Primary Gastroenterologist:  Dr. Christia Reading Misenheimer in [place]  Reason for Consultation: Abdominal pain with large duodenal ulcer at risk for perforation  HPI: Christina [name] is a 69 y.o. female a past medical history of anxiety, depression, hypertension, atrial fibrillation, coronary artery disease with 2 stents, hyperlipidemia, end-stage renal disease on hemodialysis, asthma, COPD, SBO x 2, anemia, duodenal ulcer with GI bleed 03/25/2019.  In review of her recent records, she was admitted to the hospital 03/25/2019 with acute anemia and afib with RVR.  Admission Hg 5.1.  She received 2 units of packed red blood cells. She underwent an EGD 03/26/2019 by Dr. Bryan Lemma which identified LA grade B esophagitis, 2 cm hiatal hernia, mild gastritis and multiple cratered duodenal ulcer/duodenitis, the largest being 15 mm and deeply cratered and pigmented material at the base.  This lesion was much too large for endoscopic intervention and IR intervention or surgery was recommended if she rebled.  Her Hg stabilized 9.1. She was placed on IV PPI and sucralfate and she was discharged home 03/30/2019.  She presented to [facility] ED 3/ 8 with coffee-ground hematemesis.  Hemoglobin was 11.  FOBT was negative.  She was transferred to [facility] for further evaluation.  Her hemoglobin remained stable in the range of 9.  She did not demonstrate any evidence of active bleeding.  No further hematemesis.  No hematochezia.  She was advised to continue PPI twice daily and Carafate and she was discharged home the same day.  She developed central abdominal pain with nausea, vomiting partially undigested food x 2 and the 3rd episode of vomiting consisted of partially undigested food and darker red blood on Friday 04/22/2019. She presented to [facility] ER and she was prescribed PPI bid and Carafate and she was  discharged home. She continued to have abdominal pain, no further vomiting or diarrhea. She presented to the dialysis center on 3/27 and she was tachycardic and with complaints of severe pain. Her dialysis was cancelled and she was sent by EMS to [facility] ED for further evaluation.  In the ED, her Hg was 13.2. An abdominal/pelvic CT without contrast showed an 18 mm ulcer protruding posteriorly just distal to the duodenal bulb with fluid surrounding the ulcer concerning for impending perforation.  She was evaluated by surgeon Dr. Georganna Skeans earlier today with recommendations to keep the patient n.p.o. with medical management at this point.  No plans for surgery at this time.  GI consult was requested for further evaluation and management.  Currently, she denies having any nausea or further vomiting.  She complains of central abdominal pain which is not severe at this time.  She passed a solid black stool earlier today.  She stated her stools are black in color since taking iron supplement.  No hematochezia.  She denies taking NSAIDs/Goody powder.  She denies alcohol or drug use.  Note 03/25/2019 urine drug tox was positive for cocaine.  I discussed this result with her, she denied any history of drug use.  She denies having any dysphagia or heartburn.  She has undergone 3 colonoscopies in the past, she said her most recent colonoscopy was 6 months ago and was normal.  She possibly had a colon polyp in the past.  She is followed by GI Dr. Lyda Jester in [place]. Mother with history of numerous colon polyps.  She is hungry and wishes to eat.  No  other complaints at this time.  No family at the bedside.  ED course 3.27.2021: Sodium 136.  Potassium 4.3.  Glucose 117.  BUN 31.  Creatinine 5.24.  Calcium 8.8.  Anion gap 15.  Alk phos 69.  AST 19.  ALT 12.  Lipase 76.  Albumin 3.0.  Total bili 0.5.  WBC 9.0.  Hemoglobin 13.2.  Hematocrit 43.9.  Platelet 265.  Protonix infusion at 8 mg/hour was initiated  in the ED  Abdominal/pelvic CT without contrast 04/23/2019: 1. Findings consistent with an 18 mm ulcer protruding posteriorly just distal to the duodenal bulb. There is fluid surrounding this ulcer and extending around the gallbladder which could represent impending perforation. 2. Small amount of fluid surrounds the contracted gallbladder. 3. Slight prominence of multiple distal small bowel loops without obstruction. This is nonspecific. 4. Stable bilateral adrenal adenomas. 5. Severe atherosclerosis of the distal thoracic aorta. 6. Aortic Atherosclerosis    EGD 03/26/2019 Dr. Bryan Lemma: - LA Grade B esophagitis with no bleeding. - 2 cm hiatal hernia. - Gastroesophageal flap valve classified as Hill Grade IV (no fold, wide open lumen, hiatal hernia present). - Gastritis. Biopsied. - Multiple duodenal ulcers limited to the duodenal bulb. The largest was 15 mm and deeply cratered with pigmented material. This ulcer would be too large for successful endoscopic intervention. If concern for bleeding , recommend IR or surgical consultation. The CTA findings along with these endoscopic findings are concerning for ischemic etiology. The area was biopsied. - Normal second portion of the duodenum.   Past Medical History:  Diagnosis Date  . Anemia   . Anxiety   . Arthritis   . Asthma   . Atrial fibrillation ([place])   . Chronic kidney disease    Dialysis T/Th/Sa  started in March 2020  . COPD (chronic obstructive pulmonary disease) ([place])   . Coronary artery disease    2 stents  . Depression   . GERD (gastroesophageal reflux disease)   . Gout   . Headache    migraines  . History of kidney stones   . Hyperlipidemia   . Hypertension   . Pneumonia   . Small bowel obstruction [facility])     Past Surgical History:  Procedure Laterality Date  . ABDOMINAL HYSTERECTOMY    . ABDOMINAL SURGERY     for small bowel obstruction - x 2  . APPENDECTOMY    . AV FISTULA PLACEMENT Left 08/04/2018     Procedure: ARTERIOVENOUS (AV) FISTULA CREATION LEFT ARM;  Surgeon: Waynetta Sandy, MD;  Location: [facility];  Service: Vascular;  Laterality: Left;  . BASCILIC VEIN TRANSPOSITION Left 11/24/2018   Procedure: SECOND STAGE BASILIC VEIN TRANSPOSITION LEFT ARM;  Surgeon: Waynetta Sandy, MD;  Location: [facility];  Service: Vascular;  Laterality: Left;  . BIOPSY  03/26/2019   Procedure: BIOPSY;  Surgeon: Lavena Bullion, DO;  Location: [place];  Service: Gastroenterology;;  . CARDIAC CATHETERIZATION    . CORONARY ANGIOPLASTY  ?2003/2004  . ESOPHAGOGASTRODUODENOSCOPY (EGD) WITH PROPOFOL N/A 03/26/2019   Procedure: ESOPHAGOGASTRODUODENOSCOPY (EGD) WITH PROPOFOL;  Surgeon: Lavena Bullion, DO;  Location: [place];  Service: Gastroenterology;  Laterality: N/A;  . FACIAL RECONSTRUCTION SURGERY     x 2  . HERNIA REPAIR      Prior to Admission medications   Medication Sig Start Date End Date Taking? Authorizing Provider  albuterol (PROVENTIL) (2.5 MG/3ML) 0.083% nebulizer solution Take 2.5 mg by nebulization every 6 (six) hours as needed for wheezing or shortness of  breath.   Yes [provider]  allopurinol (ZYLOPRIM) 100 MG tablet Take 100 mg by mouth daily. 02/18/18  Yes [provider]  amLODipine (NORVASC) 10 MG tablet Take 10 mg by mouth at bedtime.    Yes [provider]  budesonide-formoterol (SYMBICORT) 160-4.5 MCG/ACT inhaler Inhale 2 puffs into the lungs every 4 (four) hours as needed (shortness of breath/wheezing).   Yes [provider]  Carboxymethylcellul-Glycerin (LUBRICATING EYE DROPS OP) Place 1 drop into both eyes daily as needed (dry eyes).   Yes [provider]  Cholecalciferol (VITAMIN D3) 50 MCG (2000 UT) capsule Take 2,000 Units by mouth daily.    Yes [provider]  clobetasol (TEMOVATE) 0.05 % external solution Apply 1 application topically daily as needed (psoriasis).  04/22/19  Yes [provider]  dicyclomine (BENTYL) 10 MG capsule Take 20 mg by mouth at bedtime.  08/27/17  Yes [provider]  diphenhydrAMINE (BENADRYL) 25 MG tablet Take 50 mg by mouth at bedtime.   Yes [provider]  docusate sodium (COLACE) 100 MG capsule Take 1 capsule (100 mg total) by mouth 2 (two) times daily. Patient taking differently: Take 100 mg by mouth 2 (two) times daily as needed (constipation).  08/11/18  Yes Hongalgi, Lenis Dickinson, MD  ferric citrate (AURYXIA) 1 GM 210 MG(Fe) tablet Take 420 mg by mouth See admin instructions. Take 2 tablets (420 mg) by mouth with each meal/snack - up to four times daily   Yes [provider]  gabapentin (NEURONTIN) 300 MG capsule Take 1 capsule (300 mg total) by mouth at bedtime. 02/23/19  Yes Mercy Riding, MD  Ipratropium-Albuterol (COMBIVENT) 20-100 MCG/ACT AERS respimat Inhale 2 puffs into the lungs 4 (four) times daily as needed for wheezing. 09/11/17  Yes [provider]  isosorbide mononitrate (IMDUR) 30 MG 24 hr tablet Take 30 mg by mouth at bedtime.  08/17/18  Yes [provider]  lidocaine-prilocaine (EMLA) cream Apply 1 application topically daily as needed (pain at the site of port access).  09/03/18  Yes [provider]  metoprolol tartrate (LOPRESSOR) 100 MG tablet Take 1 tablet (100 mg total) by mouth 2 (two) times daily. 01/26/19  Yes Little Ishikawa, MD  nitroGLYCERIN (NITROSTAT) 0.4 MG SL tablet Place 0.4 mg under the tongue every 5 (five) minutes as needed for chest pain.  09/18/16  Yes [provider]  ondansetron (ZOFRAN) 4 MG tablet Take 4 mg by mouth 3 (three) times daily as needed for nausea. 09/14/18  Yes [provider]  pantoprazole (PROTONIX) 40 MG tablet Take 1 tablet (40 mg total) by mouth 2 (two) times daily. 04/04/19  Yes Shelly Coss, MD  polyethylene glycol (MIRALAX / GLYCOLAX) 17 g packet Take 17 g by mouth daily as needed for mild constipation. Patient taking  differently: Take 17 g by mouth 2 (two) times daily.  03/30/19  Yes Mercy Riding, MD  sennosides-docusate sodium (SENOKOT-S) 8.6-50 MG tablet Take 1 tablet by mouth 2 (two) times daily.    Yes [provider]  sucralfate (CARAFATE) 1 GM/10ML suspension Take 10 mLs (1 g total) by mouth every 6 (six) hours. 03/30/19 05/29/19 Yes Mercy Riding, MD    Current Facility-Administered Medications  Medication Dose Route Frequency Provider Last Rate Last Admin  . acetaminophen (TYLENOL) tablet 650 mg  650 mg Oral Q6H PRN Karmen Bongo, MD       Or  . acetaminophen (TYLENOL) suppository 650 mg  650 mg  Rectal Q6H PRN Karmen Bongo, MD      . amLODipine (NORVASC) tablet 10 mg  10 mg Oral QHS Shelly Coss, MD      . camphor-menthol [facility]) lotion 1 application  1 application Topical H3Z PRN Karmen Bongo, MD       And  . hydrOXYzine (ATARAX/VISTARIL) tablet 25 mg  25 mg Oral Q8H PRN Karmen Bongo, MD   25 mg at 04/24/19 1016  . Chlorhexidine Gluconate Cloth 2 % PADS 6 each  6 each Topical Q0600 Elmarie Shiley, MD   6 each at 04/24/19 0606  . ipratropium-albuterol (DUONEB) 0.5-2.5 (3) MG/3ML nebulizer solution 3 mL  3 mL Inhalation QID PRN Karmen Bongo, MD      . lidocaine-prilocaine (EMLA) cream 1 application  1 application Topical Daily PRN Adhikari, Amrit, MD      . metoprolol tartrate (LOPRESSOR) tablet 100 mg  100 mg Oral BID Shelly Coss, MD   100 mg at 04/24/19 1016  . mometasone-formoterol (DULERA) 200-5 MCG/ACT inhaler 2 puff  2 puff Inhalation BID Karmen Bongo, MD   2 puff at 04/24/19 1111  . ondansetron (ZOFRAN) tablet 4 mg  4 mg Oral Q6H PRN Karmen Bongo, MD       Or  . ondansetron [facility]) injection 4 mg  4 mg Intravenous Q6H PRN Karmen Bongo, MD      . pantoprazole (PROTONIX) 80 mg in sodium chloride 0.9 % 100 mL (0.8 mg/mL) infusion  8 mg/hr Intravenous Continuous Karmen Bongo, MD 10 mL/hr at 04/24/19 0605 8 mg/hr at 04/24/19 0605  . [START ON 04/27/2019]  pantoprazole (PROTONIX) injection 40 mg  40 mg Intravenous Q12H Karmen Bongo, MD      . polyvinyl alcohol (LIQUIFILM TEARS) 1.4 % ophthalmic solution 1 drop  1 drop Both Eyes Daily PRN Karmen Bongo, MD      . sodium chloride flush (NS) 0.9 % injection 3 mL  3 mL Intravenous Q12H Karmen Bongo, MD      . sucralfate (CARAFATE) 1 GM/10ML suspension 1 g  1 g Oral Q6H Greer Pickerel, MD        Allergies as of 04/23/2019 - Review Complete 04/23/2019  Allergen Reaction Noted  . Cyclobenzaprine Anaphylaxis and Other (See Comments) 04/09/2015  . Morphine Anaphylaxis 04/09/2015  . Penicillins Shortness Of Breath, Swelling, and Palpitations 04/09/2015  . Ambien [zolpidem] Other (See Comments) 02/23/2019  . Codeine Itching and Rash 04/09/2015  . Hydromorphone Other (See Comments) 05/29/2015    Family History  Problem Relation Age of Onset  . Ulcers Sister        sick a long time, improved after surgery    Social History   Socioeconomic History  . Marital status: Single    Spouse name: Not on file  . Number of children: Not on file  . Years of education: Not on file  . Highest education level: Not on file  Occupational History  . Occupation: retired  Tobacco Use  . Smoking status: Former Smoker    Packs/day: 0.50    Years: 50.00    Pack years: 25.00    Quit date: 2019    Years since quitting: 2.2  . Smokeless tobacco: Never Used  Substance and Sexual Activity  . Alcohol use: Not Currently    Comment: quit 2019 - "I was a drunk"  . Drug use: Not Currently    Types: Marijuana    Comment: h/o drug use - "I was raised in the 60s"; last use 18 months ago;  smokes marijuana periodically  . Sexual activity: Not on file  Other Topics Concern  . Not on file  Social History Narrative  . Not on file   Social Determinants of Health   Financial Resource Strain:   . Difficulty of Paying Living Expenses:   Food Insecurity:   . Worried About Charity fundraiser in the Last Year:   .  Arboriculturist in the Last Year:   Transportation Needs:   . Film/video editor (Medical):   Marland Kitchen Lack of Transportation (Non-Medical):   Physical Activity:   . Days of Exercise per Week:   . Minutes of Exercise per Session:   Stress:   . Feeling of Stress :   Social Connections:   . Frequency of Communication with Friends and Family:   . Frequency of Social Gatherings with Friends and Family:   . Attends Religious Services:   . Active Member of Clubs or Organizations:   . Attends Archivist Meetings:   Marland Kitchen Marital Status:   Intimate Partner Violence:   . Fear of Current or Ex-Partner:   . Emotionally Abused:   Marland Kitchen Physically Abused:   . Sexually Abused:     Review of Systems: See HPI, all other systems reviewed and are negative  Physical Exam: Vital signs in last 24 hours: Temp:  [96.8 F (36 C)-97.8 F (36.6 C)] 96.8 F (36 C) (03/28 0908) Pulse Rate:  [55-149] 75 (03/28 0908) Resp:  [12-33] 15 (03/28 0908) BP: (132-191)/(64-115) 164/83 (03/28 0908) SpO2:  [86 %-100 %] 96 % (03/28 1112) Weight:  [41.2 kg] 41.2 kg (03/28 0511)   General: Thin petite 69 year old female in no acute distress. Head:  Normocephalic and atraumatic. Eyes:  No scleral icterus. Conjunctiva pink. Ears:  Normal auditory acuity. Nose:  No deformity, discharge or lesions. Mouth: Poor dentition.  No ulcers or lesions.  Neck:  Supple. No lymphadenopathy or thyromegaly.  Lungs: Breath sounds clear throughout. Heart: Regular rate and rhythm.  Systolic murmur. Abdomen: Soft, nondistended.  Moderate central tenderness from the epigastric area to below the umbilicus without rebound or guarding.  Hypoactive bowel sounds to all 4 quadrants.  No HSM.  Midline abdominal scar intact. Rectal:  Deferred. Msk:  Symmetrical without gross deformities.  Pulses:  Normal pulses noted. Extremities:  Without clubbing or edema.  Left upper extremity fistula with positive bruit and thrill. Neurologic:  Alert  and  oriented x4. No focal deficits.  Skin:  Intact without significant lesions or rashes. Psych:  Alert and cooperative. Normal mood and affect.  Intake/Output from previous day: 03/27 0701 - 03/28 0700 In: 588.7 [I.V.:88.7; IV Piggyback:500] Out: -  Intake/Output this shift: No intake/output data recorded.  Lab Results: Recent Labs    04/23/19 1245 04/24/19 0010  WBC 9.0 7.7  HGB 13.2 11.0*  HCT 43.9 36.6  PLT 265 283   BMET Recent Labs    04/23/19 1245  NA 136  K 4.3  CL 99  CO2 22  GLUCOSE 117*  BUN 31*  CREATININE 5.24*  CALCIUM 8.8*   LFT Recent Labs    04/23/19 1245  PROT 6.5  ALBUMIN 3.0*  AST 19  ALT 12  ALKPHOS 69  BILITOT 0.5   PT/INR No results for input(s): LABPROT, INR in the last 72 hours. Hepatitis Panel No results for input(s): HEPBSAG, HCVAB, HEPAIGM, HEPBIGM in the last 72 hours.    Studies/Results: CT ABDOMEN PELVIS W CONTRAST  Result Date: 04/23/2019 CLINICAL DATA:  Nausea, vomiting, and diarrhea. Tachycardia. EXAM: CT ABDOMEN AND PELVIS WITH CONTRAST 2:19 p.m. TECHNIQUE: Multidetector CT imaging of the abdomen and pelvis was performed using the standard protocol following bolus administration of intravenous contrast. CONTRAST:  151m OMNIPAQUE IOHEXOL 300 MG/ML  SOLN COMPARISON:  [facility] FINDINGS: Lower chest: No acute abnormality. Severe atherosclerosis of the distal thoracic aorta. Slight linear scarring at the left lung base, stable. Hepatobiliary, stomach/Bowel: A small amount of fluid surrounds the contracted gallbladder. The gallbladder has contracted slightly since the prior study. The fluid extends around the duodenum just distal to the duodenal bulb. Slight prominence of multiple distal small bowel loops without obstruction. This is nonspecific. Appendix has been removed. There appears to be an 18 mm ulcer protruding posteriorly just distal to the  duodenal bulb best seen on image 38 of series 7 and image 25 of series 3 and image 42 of series 6. This was not definable on the prior study done earlier today. Pancreas: Unremarkable. No pancreatic ductal dilatation or surrounding inflammatory changes. Spleen: Normal in size without focal abnormality. Adrenals/Urinary Tract: Bilateral adrenal adenomas, unchanged. Multiple bilateral renal cysts. No hydronephrosis. Bladder is normal. Vascular/Lymphatic: Extensive aortic atherosclerosis. No adenopathy. Reproductive: Status post hysterectomy. No adnexal masses. Other: No ascites. No abdominal wall hernias. Musculoskeletal: No acute or significant osseous findings. IMPRESSION: 1. Findings consistent with an 18 mm ulcer protruding posteriorly just distal to the duodenal bulb. There is fluid surrounding this ulcer and extending around the gallbladder which could represent impending perforation. 2. Small amount of fluid surrounds the contracted gallbladder. 3. Slight prominence of multiple distal small bowel loops without obstruction. This is nonspecific. 4. Stable bilateral adrenal adenomas. 5. Severe atherosclerosis of the distal thoracic aorta. Aortic Atherosclerosis (ICD10-I70.0). Electronically Signed   By: JLorriane ShireM.D.   On: 04/23/2019 15:09   DG Chest Port 1 View  Result Date: 04/23/2019 CLINICAL DATA:  Abdominal pain EXAM: PORTABLE CHEST 1 VIEW COMPARISON:  Chest radiograph dated 04/11/2019 FINDINGS: The heart is enlarged. Vascular calcifications are seen in the aortic arch. Both lungs are clear. The visualized skeletal structures are unremarkable. A right internal jugular central venous catheter tip overlies the right atrium. IMPRESSION: No active disease. Electronically Signed   By: TZerita BoersM.D.   On: 04/23/2019 13:39    IMPRESSION/PLAN:  116  69year old female admitted with hematemesis and upper abdominal.  Recently hospitalized with the same symptoms, status post EGD 2/27 identified  multiple duodenal ulcers to the duodenal bulb, the largest measured 15 mm and was deeply created, too large for successful endoscopic intervention.  If rebleeding occurred IR embolization or surgical intervention was recommended.  A CTA 2/26 was concerning for ischemic etiology as well.  CTAP 3/27 identified an 18 mm ulcer protruding posteriorly distal to the duodenal bulb with surrounding fluid concerning for impending perforation.  A small amount of fluid around the gallbladder was noted as well.  Neurosurgery was consulted, no plans for surgical intervention at this time.  Hemoglobin 13.2->11.0.  No further hematemesis since admission. -Agree with Pantoprazole 8 mg/hour infusion -Check H/H Q 8 hrs next 24 hours -Transfuse for hemoglobin less than 8 -NPO -IV fluid per the hospitalist/renal team -Ondansetron  4 mg IV as needed -Pain per the hospitalist   2.  Chronic anemia  3.  End-stage renal disease on hemodialysis  4.  Paroxysmal atrial fibrillation.  Heart rate is currently controlled.  Not on anticoagulation.  5.  Coronary artery disease  6.  History of COPD, currently stable  7. Bilateral adrenal masses   Further  recommendations per Dr. Verta Ellen Dorathy Daft  04/24/2019, 12:32 PM  GI ATTENDING  History, laboratories, x-rays, prior endoscopy report reviewed.  Patient seen and examined.  Agree with comprehensive consultation note as outlined above.  IMPRESSION: 1.  Nonhealing giant duodenal ulcer despite, which should be, appropriate medical therapy.  No confounding variables such as NSAIDs.  I am highly concerned based on her vascular anatomy that the etiology of her ulcer is ischemia (as opposed to standard acid peptic type).  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  RECOMMENDATIONS: 1.  Continue PPI 2.  Consult vascular surgery regarding celiac artery stenosis and probable resultant duodenal ischemia with ulceration.  Query stenting  candidate. 3.  MULTIPLE significant medical problems  Kailash Hinze N. Geri [place]in[place]., M.D. [facility] Division of Gastroenterology

## 2019-04-25 ENCOUNTER — Inpatient Hospital Stay (HOSPITAL_COMMUNITY): Payer: Medicare Other

## 2019-04-25 DIAGNOSIS — K269 Duodenal ulcer, unspecified as acute or chronic, without hemorrhage or perforation: Secondary | ICD-10-CM

## 2019-04-25 DIAGNOSIS — K551 Chronic vascular disorders of intestine: Secondary | ICD-10-CM

## 2019-04-25 DIAGNOSIS — I739 Peripheral vascular disease, unspecified: Secondary | ICD-10-CM

## 2019-04-25 LAB — BASIC METABOLIC PANEL
Anion gap: 14 (ref 5–15)
BUN: 10 mg/dL (ref 8–23)
CO2: 26 mmol/L (ref 22–32)
Calcium: 8.4 mg/dL — ABNORMAL LOW (ref 8.9–10.3)
Chloride: 99 mmol/L (ref 98–111)
Creatinine, Ser: 3.04 mg/dL — ABNORMAL HIGH (ref 0.44–1.00)
GFR calc Af Amer: 17 mL/min — ABNORMAL LOW (ref >60)
GFR calc non Af Amer: 15 mL/min — ABNORMAL LOW (ref >60)
Glucose, Bld: 76 mg/dL (ref 70–99)
Potassium: 3.5 mmol/L (ref 3.5–5.1)
Sodium: 139 mmol/L (ref 135–145)

## 2019-04-25 LAB — CBC WITH DIFFERENTIAL/PLATELET
Abs Immature Granulocytes: 0.02 10*3/uL (ref 0.00–0.07)
Basophils Absolute: 0.1 10*3/uL (ref 0.0–0.1)
Basophils Relative: 1 %
Eosinophils Absolute: 0.2 10*3/uL (ref 0.0–0.5)
Eosinophils Relative: 3 %
HCT: 39.5 % (ref 36.0–46.0)
Hemoglobin: 12.1 g/dL (ref 12.0–15.0)
Immature Granulocytes: 0 %
Lymphocytes Relative: 22 %
Lymphs Abs: 1.2 10*3/uL (ref 0.7–4.0)
MCH: 27.6 pg (ref 26.0–34.0)
MCHC: 30.6 g/dL (ref 30.0–36.0)
MCV: 90 fL (ref 80.0–100.0)
Monocytes Absolute: 0.6 10*3/uL (ref 0.1–1.0)
Monocytes Relative: 11 %
Neutro Abs: 3.3 10*3/uL (ref 1.7–7.7)
Neutrophils Relative %: 63 %
Platelets: 255 10*3/uL (ref 150–400)
RBC: 4.39 MIL/uL (ref 3.87–5.11)
RDW: 17.2 % — ABNORMAL HIGH (ref 11.5–15.5)
WBC: 5.3 10*3/uL (ref 4.0–10.5)
nRBC: 0 % (ref 0.0–0.2)

## 2019-04-25 MED ORDER — CALCITRIOL 0.25 MCG PO CAPS
1.0000 ug | ORAL_CAPSULE | ORAL | Status: DC
  Administered 2019-04-26 – 2019-04-28 (×2): 1 ug via ORAL

## 2019-04-25 MED ORDER — POLYETHYLENE GLYCOL 3350 17 G PO PACK
17.0000 g | PACK | Freq: Every day | ORAL | Status: DC
  Administered 2019-04-25 – 2019-04-29 (×4): 17 g via ORAL
  Filled 2019-04-25 (×4): qty 1

## 2019-04-25 MED ORDER — CHLORHEXIDINE GLUCONATE CLOTH 2 % EX PADS
6.0000 | MEDICATED_PAD | Freq: Every day | CUTANEOUS | Status: DC
  Administered 2019-04-27: 6 via TOPICAL

## 2019-04-25 NOTE — Consult Note (Signed)
Referring Physician: [facility] hospital[facility]  Patient name: Christina [name] MRN: [id number] DOB: Jul 16, 1950 Sex: female  REASON FOR CONSULT: Chronic mesenteric ischemia  HPI: Christina [name] is a 69 y.o. 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  The patient was admitted for duodenal ulcer.  She did have a bleeding incident from this March 26, 2019.  She was initially evaluated by general surgery last week.  There was some concern that the ulcer may be at high risk for perforation.  Currently there is no operative intervention planned for this.  She is being managed by the GI service.  She is currently on Protonix drip.  The GI service was suspicious that her delayed healing of her duodenal ulcer was secondary to mesenteric ischemia.  The patient has lost about 40 pounds in the last 6 months.  She does get abdominal pain if she drinks coffee or eats raw vegetables.  Otherwise though most meals go down without any abdominal pain.  She does not have food fear.  She is on chronic hemodialysis.  Her dialysis today is Tuesday Thursday Saturday.  She has a functioning left arm AV fistula.  She is a former smoker but quit 6 months ago.  Other medical problems include coronary artery disease, COPD, hyperlipidemia, hypertension all of which have been stable.  There is some mention in her past medical history of atrial fibrillation but the patient did not know the details regarding this.  She is not currently on anticoagulation.  Her aspirin has been stopped due to GI bleeding.  She has had a previous laparotomy for small bowel obstruction.  Of note on additional questioning she does have what sounds like some claudication symptoms in the left leg.  She has also had some skin color changes in the left foot that have been present for about 18 months.  She has no open ulceration.  She does not really describe rest  pain.  Past Medical History:  Diagnosis Date  . Anemia   . Anxiety   . Arthritis   . Asthma   . Atrial fibrillation ([place])   . Chronic kidney disease    Dialysis T/Th/Sa  started in March 2020  . COPD (chronic obstructive pulmonary disease) ([place])   . Coronary artery disease    2 stents  . Depression   . GERD (gastroesophageal reflux disease)   . Gout   . Headache    migraines  . History of kidney stones   . Hyperlipidemia   . Hypertension   . Pneumonia   . Small bowel obstruction [facility])    Past Surgical History:  Procedure Laterality Date  . ABDOMINAL HYSTERECTOMY    . ABDOMINAL SURGERY     for small bowel obstruction - x 2  . APPENDECTOMY    . AV FISTULA PLACEMENT Left 08/04/2018   Procedure: ARTERIOVENOUS (AV) FISTULA CREATION LEFT ARM;  Surgeon: Waynetta Sandy, MD;  Location: [place];  Service: Vascular;  Laterality: Left;  . BASCILIC VEIN TRANSPOSITION Left 11/24/2018   Procedure: SECOND STAGE BASILIC VEIN TRANSPOSITION LEFT ARM;  Surgeon: Waynetta Sandy, MD;  Location: [place];  Service: Vascular;  Laterality: Left;  . BIOPSY  03/26/2019   Procedure: BIOPSY;  Surgeon: Lavena Bullion, DO;  Location: [place];  Service: Gastroenterology;;  . CARDIAC CATHETERIZATION    . CORONARY ANGIOPLASTY  ?2003/2004  . ESOPHAGOGASTRODUODENOSCOPY (EGD) WITH PROPOFOL  N/A 03/26/2019   Procedure: ESOPHAGOGASTRODUODENOSCOPY (EGD) WITH PROPOFOL;  Surgeon: Lavena Bullion, DO;  Location: [place];  Service: Gastroenterology;  Laterality: N/A;  . FACIAL RECONSTRUCTION SURGERY     x 2  . HERNIA REPAIR      Family History  Problem Relation Age of Onset  . Ulcers Sister        sick a long time, improved after surgery    SOCIAL HISTORY: Social History   Socioeconomic History  . Marital status: Single    Spouse name: Not on file  . Number of children: Not on file  . Years of education: Not on file  . Highest education level: Not on file  Occupational  History  . Occupation: retired  Tobacco Use  . Smoking status: Former Smoker    Packs/day: 0.50    Years: 50.00    Pack years: 25.00    Quit date: 2019    Years since quitting: 2.2  . Smokeless tobacco: Never Used  Substance and Sexual Activity  . Alcohol use: Not Currently    Comment: quit 2019 - "I was a drunk"  . Drug use: Not Currently    Types: Marijuana    Comment: h/o drug use - "I was raised in the 60s"; last use 18 months ago; smokes marijuana periodically  . Sexual activity: Not on file  Other Topics Concern  . Not on file  Social History Narrative  . Not on file   Social Determinants of Health   Financial Resource Strain:   . Difficulty of Paying Living Expenses:   Food Insecurity:   . Worried About Charity fundraiser in the Last Year:   . Arboriculturist in the Last Year:   Transportation Needs:   . Film/video editor (Medical):   Marland Kitchen Lack of Transportation (Non-Medical):   Physical Activity:   . Days of Exercise per Week:   . Minutes of Exercise per Session:   Stress:   . Feeling of Stress :   Social Connections:   . Frequency of Communication with Friends and Family:   . Frequency of Social Gatherings with Friends and Family:   . Attends Religious Services:   . Active Member of Clubs or Organizations:   . Attends Archivist Meetings:   Marland Kitchen Marital Status:   Intimate Partner Violence:   . Fear of Current or Ex-Partner:   . Emotionally Abused:   Marland Kitchen Physically Abused:   . Sexually Abused:     Allergies  Allergen Reactions  . Cyclobenzaprine Anaphylaxis and Other (See Comments)    "stopped heart"   . Morphine Anaphylaxis    "stopped heart"  . Penicillins Shortness Of Breath, Swelling and Palpitations    Did it involve swelling of the face/tongue/throat, SOB, or low BP? Yes Did it involve sudden or severe rash/hives, skin peeling, or any reaction on the inside of your mouth or nose? No Did you need to seek medical attention at a hospital  or doctor's office? No When did it last happen?years  If all above answers are "NO", may proceed with cephalosporin use.   . Ambien [Zolpidem] Other (See Comments)    Severe confusion  . Codeine Itching and Rash  . Hydromorphone Other (See Comments)    If administered quickly, felt like hand was "exploding"     Current Facility-Administered Medications  Medication Dose Route Frequency Provider Last Rate Last Admin  . acetaminophen (TYLENOL) tablet 650 mg  650 mg Oral Q6H  PRN Karmen Bongo, MD       Or  . acetaminophen (TYLENOL) suppository 650 mg  650 mg Rectal Q6H PRN Karmen Bongo, MD      . amLODipine (NORVASC) tablet 10 mg  10 mg Oral QHS Shelly Coss, MD   10 mg at 04/24/19 2324  . [START ON 04/26/2019] calcitRIOL (ROCALTROL) capsule 1 mcg  1 mcg Oral Q T,Th,Sa-HD Claudia Desanctis, MD      . camphor-menthol [facility]) lotion 1 application  1 application Topical G6Y PRN Karmen Bongo, MD   1 application at 40/34/74 [phone]   And  . hydrOXYzine (ATARAX/VISTARIL) tablet 25 mg  25 mg Oral Q8H PRN Karmen Bongo, MD   25 mg at 04/24/19 1016  . Chlorhexidine Gluconate Cloth 2 % PADS 6 each  6 each Topical Q0600 Elmarie Shiley, MD   6 each at 04/25/19 1035  . ipratropium-albuterol (DUONEB) 0.5-2.5 (3) MG/3ML nebulizer solution 3 mL  3 mL Inhalation QID PRN Karmen Bongo, MD      . lidocaine-prilocaine (EMLA) cream 1 application  1 application Topical Daily PRN Adhikari, Amrit, MD      . metoprolol tartrate (LOPRESSOR) tablet 100 mg  100 mg Oral BID Shelly Coss, MD   100 mg at 04/25/19 1035  . mometasone-formoterol (DULERA) 200-5 MCG/ACT inhaler 2 puff  2 puff Inhalation BID Karmen Bongo, MD   2 puff at 04/25/19 0748  . ondansetron (ZOFRAN) tablet 4 mg  4 mg Oral Q6H PRN Karmen Bongo, MD       Or  . ondansetron [facility]) injection 4 mg  4 mg Intravenous Q6H PRN Karmen Bongo, MD      . pantoprazole (PROTONIX) 80 mg in sodium chloride 0.9 % 100 mL (0.8 mg/mL) infusion  8  mg/hr Intravenous Continuous Karmen Bongo, MD 10 mL/hr at 04/25/19 0334 8 mg/hr at 04/25/19 0334  . [START ON 04/27/2019] pantoprazole (PROTONIX) injection 40 mg  40 mg Intravenous Q12H Karmen Bongo, MD      . polyvinyl alcohol (LIQUIFILM TEARS) 1.4 % ophthalmic solution 1 drop  1 drop Both Eyes Daily PRN Karmen Bongo, MD      . sodium chloride flush (NS) 0.9 % injection 3 mL  3 mL Intravenous Q12H Karmen Bongo, MD   3 mL at 04/25/19 1036  . sucralfate (CARAFATE) 1 GM/10ML suspension 1 g  1 g Oral Q6H Greer Pickerel, MD   1 g at 04/25/19 0511    ROS:   General:  + weight loss, no fever, no chills  HEENT: No recent headaches, no nasal bleeding, no visual changes, no sore throat  Neurologic: No dizziness, blackouts, seizures. No recent symptoms of stroke or mini- stroke. No recent episodes of slurred speech, or temporary blindness.  Cardiac: No recent episodes of chest pain/pressure, no shortness of breath at rest.  + shortness of breath with exertion.  Denies history of atrial fibrillation or irregular heartbeat  Vascular: No history of rest pain in feet.  + history of claudication.  No history of non-healing ulcer, No history of DVT   Pulmonary: No home oxygen, no productive cough, no hemoptysis,  No asthma or wheezing  Musculoskeletal:  [ ]  Arthritis, [ ]  Low back pain,  [ ]  Joint pain  Hematologic:No history of hypercoagulable state.  No history of easy bleeding.  No history of anemia  Gastrointestinal: + hematochezia or melena,  + gastroesophageal reflux, no trouble swallowing  Urinary: [X]  chronic Kidney disease, [X]  on HD - [ ]  MWF or [  X] TTHS, [ ]  Burning with urination, [ ]  Frequent urination, [ ]  Difficulty urinating;   Skin: No rashes  Psychological: No history of anxiety,  No history of depression   Physical Examination  Vitals:   04/24/19 2303 04/25/19 0344 04/25/19 0748 04/25/19 0835  BP: (!) 168/112 (!) 150/70  (!) 151/63  Pulse: 80 69  65  Resp:    18    Temp: 98.1 F (36.7 C) 98.2 F (36.8 C)  97.8 F (36.6 C)  TempSrc: Axillary Oral  Oral  SpO2: 100% 100% 99% 100%  Weight:      Height:        Body mass index is 16.25 kg/m.  General:  Alert and oriented, no acute distress HEENT: Normal Neck: No JVD Cardiac: Regular Rate and Rhythm Abdomen: Soft, mildly tender with palpation, non-distended, no mass, reducible periumbilical hernia, well-healed lower midline scar Skin: red petechial rash dorsum left foot Extremity Pulses:  2+ radial, brachial, femoral, 2+ right absent left dorsalis pedis, absent posterior tibial pulses bilaterally Musculoskeletal: No deformity or edema  Neurologic: Upper and lower extremity motor 5/5 and symmetric  DATA:  I reviewed the patient's CT angiogram from late February of this year.  This shows about a 70% narrowing of the proximal superior mesenteric artery as well as occlusion of the celiac and possible occlusion of the inferior mesenteric artery.  I have reviewed and interpreted all of these images.  I also reviewed the patient's CT scan of the abdomen and pelvis dated April 23, 2019.  This also shows bilateral adrenal adenomas.  The distal thoracic and abdominal aorta has multiple areas of thrombus lining this and is diffusely diseased with atherosclerotic changes.  CBC    Component Value Date/Time   WBC 5.3 04/25/2019 0328   RBC 4.39 04/25/2019 0328   HGB 12.1 04/25/2019 0328   HCT 39.5 04/25/2019 0328   PLT 255 04/25/2019 0328   MCV 90.0 04/25/2019 0328   MCH 27.6 04/25/2019 0328   MCHC 30.6 04/25/2019 0328   RDW 17.2 (H) 04/25/2019 0328   LYMPHSABS 1.2 04/25/2019 0328   MONOABS 0.6 04/25/2019 0328   EOSABS 0.2 04/25/2019 0328   BASOSABS 0.1 04/25/2019 0328    BMET    Component Value Date/Time   NA 139 04/25/2019 0328   K 3.5 04/25/2019 0328   CL 99 04/25/2019 0328   CO2 26 04/25/2019 0328   GLUCOSE 76 04/25/2019 0328   BUN 10 04/25/2019 0328   CREATININE 3.04 (H) 04/25/2019 0328    CALCIUM 8.4 (L) 04/25/2019 0328   GFRNONAA 15 (L) 04/25/2019 0328   GFRAA 17 (L) 04/25/2019 0328     ASSESSMENT: Patient with some abdominal complaints with eating and weight loss.  She also has a large duodenal ulcer.  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  Patient also has an ischemic-looking left foot but currently this is only mildly symptomatic with claudication.   PLAN: Abdominal aortogram mesenteric angiogram possible intervention on Wednesday by my partner Dr. Carlis Abbott April 27, 2019.  Risk benefits possible complications and procedure details of mesenteric angiogram were discussed with patient today.  These include but are not limited to bleeding infection vessel injury.  I also discussed with her that she will need to be on dual antiplatelet therapy if we place a mesenteric stent.  This would require Plavix and aspirin.  This may increase her risk of upper GI bleeding.  She will certainly be high risk for any intervention  due to her multiple medical problems and recent GI bleeding.  Most likely will only do the mesenteric intervention but if contrast is not limited and intervention for her mesenteric's goes smoothly we may also do left lower extremity arteriogram.  We will also obtain bilateral ABIs.   Ruta Hinds, MD [facility] Office: [phone] Pager: [phone]

## 2019-04-25 NOTE — Progress Notes (Signed)
ABI exam completed.  Preliminary results can be found under CV proc under chart review.  04/25/2019 12:45 PM  Penne Rosenstock, K., RDMS, RVT

## 2019-04-25 NOTE — Progress Notes (Signed)
Pt arrived to 4E27 after hemodialysis. She appeared very impulsive and anxious , stated she feel very hungry. She hasn't eaten any thing for a whole day today. She already ate some food she ordered from hemodialysis department. I reviewed her update NPO order today. However she already ate almost all of the try. Education given, Pt understood and agreed to NPO.  Pt denied nausea/ vomiting, no abdominal pain.  Her vital remained stable as her baseline. We will continue to monitor.  Kennyth Lose, RN

## 2019-04-25 NOTE — Progress Notes (Addendum)
Progress Note   Subjective  Chief Complaint: Abdominal pain with large duodenal ulcer risk for perforation  This morning the patient was found sitting up in her bed cleaning out her purse.  She tells me that she is ready to go home.  She has seen no further bleeding and denies any current abdominal pain.  Denies any further nausea or vomiting.  Patient does complain about not getting anything to eat.   Objective   Vital signs in last 24 hours: Temp:  [97.6 F (36.4 C)-98.2 F (36.8 C)] 97.8 F (36.6 C) (03/29 0835) Pulse Rate:  [60-81] 65 (03/29 0835) Resp:  [17-20] 18 (03/29 0835) BP: (133-193)/(53-112) 151/63 (03/29 0835) SpO2:  [96 %-100 %] 100 % (03/29 0835) Weight:  [40.3 kg-41.1 kg] 40.3 kg (03/28 2236) Last BM Date: 04/23/19 General: Thin, petite white female in NAD Heart:  Regular rate and rhythm; no murmurs Lungs: Respirations even and unlabored, lungs CTA bilaterally Abdomen:  Soft, nontender and nondistended. Normal bowel sounds. Extremities: Left upper extremity fistula with positive bruit and thrill Neurologic:  Alert and oriented,  grossly normal neurologically. Psych:  Cooperative. Normal mood and affect.  Intake/Output from previous day: 03/28 0701 - 03/29 0700 In: 620.7 [P.O.:490; I.V.:130.7] Out: 1201 [Urine:1]  Lab Results: Recent Labs    04/23/19 1245 04/23/19 1245 04/24/19 0010 04/24/19 1810 04/25/19 0328  WBC 9.0  --  7.7  --  5.3  HGB 13.2   < > 11.0* 12.4 12.1  HCT 43.9   < > 36.6 41.2 39.5  PLT 265  --  283  --  255   < > = values in this interval not displayed.   BMET Recent Labs    04/23/19 1245 04/25/19 0328  NA 136 139  K 4.3 3.5  CL 99 99  CO2 22 26  GLUCOSE 117* 76  BUN 31* 10  CREATININE 5.24* 3.04*  CALCIUM 8.8* 8.4*   LFT Recent Labs    04/23/19 1245  PROT 6.5  ALBUMIN 3.0*  AST 19  ALT 12  ALKPHOS 69  BILITOT 0.5   Studies/Results: CT ABDOMEN PELVIS W CONTRAST  Result Date: 04/23/2019 CLINICAL DATA:   Nausea, vomiting, and diarrhea. Tachycardia. EXAM: CT ABDOMEN AND PELVIS WITH CONTRAST 2:19 p.m. TECHNIQUE: Multidetector CT imaging of the abdomen and pelvis was performed using the standard protocol following bolus administration of intravenous contrast. CONTRAST:  131mL OMNIPAQUE IOHEXOL 300 MG/ML  SOLN COMPARISON:  CT scans dated 04/23/2019 at 4:14 a.m. at [facility] and 03/06/2019 and 03/25/2019 FINDINGS: Lower chest: No acute abnormality. Severe atherosclerosis of the distal thoracic aorta. Slight linear scarring at the left lung base, stable. Hepatobiliary, stomach/Bowel: A small amount of fluid surrounds the contracted gallbladder. The gallbladder has contracted slightly since the prior study. The fluid extends around the duodenum just distal to the duodenal bulb. Slight prominence of multiple distal small bowel loops without obstruction. This is nonspecific. Appendix has been removed. There appears to be an 18 mm ulcer protruding posteriorly just distal to the duodenal bulb best seen on image 38 of series 7 and image 25 of series 3 and image 42 of series 6. This was not definable on the prior study done earlier today. Pancreas: Unremarkable. No pancreatic ductal dilatation or surrounding inflammatory changes. Spleen: Normal in size without focal abnormality. Adrenals/Urinary Tract: Bilateral adrenal adenomas, unchanged. Multiple bilateral renal cysts. No hydronephrosis. Bladder is normal. Vascular/Lymphatic: Extensive aortic atherosclerosis. No adenopathy. Reproductive: Status post hysterectomy. No adnexal masses. Other:  No ascites. No abdominal wall hernias. Musculoskeletal: No acute or significant osseous findings. IMPRESSION: 1. Findings consistent with an 18 mm ulcer protruding posteriorly just distal to the duodenal bulb. There is fluid surrounding this ulcer and extending around the gallbladder which could represent impending perforation. 2. Small amount of fluid surrounds the contracted  gallbladder. 3. Slight prominence of multiple distal small bowel loops without obstruction. This is nonspecific. 4. Stable bilateral adrenal adenomas. 5. Severe atherosclerosis of the distal thoracic aorta. Aortic Atherosclerosis (ICD10-I70.0). Electronically Signed   By: Lorriane Shire M.D.   On: 04/23/2019 15:09   DG Chest Port 1 View  Result Date: 04/23/2019 CLINICAL DATA:  Abdominal pain EXAM: PORTABLE CHEST 1 VIEW COMPARISON:  Chest radiograph dated 04/11/2019 FINDINGS: The heart is enlarged. Vascular calcifications are seen in the aortic arch. Both lungs are clear. The visualized skeletal structures are unremarkable. A right internal jugular central venous catheter tip overlies the right atrium. IMPRESSION: No active disease. Electronically Signed   By: Zerita Boers M.D.   On: 04/23/2019 13:39    Assessment / Plan:    Assessment: 1.  Hematemesis with upper abdominal pain: Recently hospitalized with similar symptoms status post EGD 2/27 identified multiple duodenal ulcers to the duodenal bulb, largest measured 15 mm and was deeply cratered, too large for successful endoscopic intervention, if rebleeding occurred IR embolization or surgical intervention was recommended, CTA 2/26 is concerning for ischemic etiology as well, CTAP 3/27 identified an 18 mm ulcer protruding posteriorly distal to the duodenal bulb with surrounding fluid concerning for impending perforation, hemoglobin 13.2--> 11.0-->12.1, no further hematemesis, hemoglobin stable 2.  End-stage renal disease on hemodialysis 3.  Paroxysmal A. fib, not on anticoagulation 4.  CAD 5.  History of COPD  Plan: 1.  Continue PPI 2.  Again recommend consulting vascular surgery regarding celiac artery stenosis and probable resultant duodenal ischemia with ulceration, which she benefit from stenting 3.  Continue to monitor hemoglobin with transfusion as needed 4.  Please await any further recommendations from Dr. Carlean Purl later today  Thank  you for your kind consultation.    LOS: 2 days   Levin Erp  04/25/2019, 10:02 AM  Agree with Ms. Mort Sawyers evaluation and management. VVS saw her and mesenteric a gram planned 4/1 We will see what that shows but will not see her tomorrow  Gatha Mayer, MD, [facility]

## 2019-04-25 NOTE — Evaluation (Signed)
Clinical/Bedside Swallow Evaluation Patient Details  Name: Christina [name] MRN: [id number] Date of Birth: 05-Sep-1950  Today's Date: 04/25/2019 Time: SLP Start Time (ACUTE ONLY): 2993 SLP Stop Time (ACUTE ONLY): 1623 SLP Time Calculation (min) (ACUTE ONLY): 13 min  Past Medical History:  Past Medical History:  Diagnosis Date  . Anemia   . Anxiety   . Arthritis   . Asthma   . Atrial fibrillation ([place])   . Chronic kidney disease    Dialysis T/Th/Sa  started in March 2020  . COPD (chronic obstructive pulmonary disease) ([place])   . Coronary artery disease    2 stents  . Depression   . GERD (gastroesophageal reflux disease)   . Gout   . Headache    migraines  . History of kidney stones   . Hyperlipidemia   . Hypertension   . Pneumonia   . Small bowel obstruction [facility])    Past Surgical History:  Past Surgical History:  Procedure Laterality Date  . ABDOMINAL HYSTERECTOMY    . ABDOMINAL SURGERY     for small bowel obstruction - x 2  . APPENDECTOMY    . AV FISTULA PLACEMENT Left 08/04/2018   Procedure: ARTERIOVENOUS (AV) FISTULA CREATION LEFT ARM;  Surgeon: Waynetta Sandy, MD;  Location: [place];  Service: Vascular;  Laterality: Left;  . BASCILIC VEIN TRANSPOSITION Left 11/24/2018   Procedure: SECOND STAGE BASILIC VEIN TRANSPOSITION LEFT ARM;  Surgeon: Waynetta Sandy, MD;  Location: [place];  Service: Vascular;  Laterality: Left;  . BIOPSY  03/26/2019   Procedure: BIOPSY;  Surgeon: Lavena Bullion, DO;  Location: [place];  Service: Gastroenterology;;  . CARDIAC CATHETERIZATION    . CORONARY ANGIOPLASTY  ?2003/2004  . ESOPHAGOGASTRODUODENOSCOPY (EGD) WITH PROPOFOL N/A 03/26/2019   Procedure: ESOPHAGOGASTRODUODENOSCOPY (EGD) WITH PROPOFOL;  Surgeon: Lavena Bullion, DO;  Location: [place];  Service: Gastroenterology;  Laterality: N/A;  . FACIAL RECONSTRUCTION SURGERY     x 2  . HERNIA REPAIR     HPI:  Pt is a 69 y.o. female with medical history  significant of SBO, HTN, HLD, depression, CAD with stents, COPD, ESRD on TTS HD,  and Afib who presented with abdominal pain. She was admitted last time on 2/26 and was discharged on 3/3.  In that hospitalization, she underwent EGD which showed gastritis, esophagitis, multiple duodenal ulcers.  She was started on PPI and Carafate and she was taking them. CT of the abdomen: 18 mm ulcer protruding posteriorly just distal to the duodenal bulb. Fluid surrounding this ulcer and extending around the gallbladder which could represent impending perforation. CXR of 04/23/19 No active disease. WBC WNL and pt afebrile at time of eval.    Assessment / Plan / Recommendation Clinical Impression  Pt was seen for bedside swallow evaluation and she denied a history of dysphagia. She reported intake of regular texture solids and thin liquids but stated that she avoids meats due to preference. Oral mechanism exam was [facility] and she presented with adequate dentition with reduced mandibular incisors. Trials were limited to ice chips and water due to pt being on a clear liquid diet per GI's recommendations. She tolerated all trials without symptoms of oropharyngeal dysphagia and mastication of ice chips was WNL. Pt's diet may be advanced per GI's recommendation and further skilled SLP services are not clinically indicated at this time for swallowing.  SLP Visit Diagnosis: Dysphagia, unspecified (R13.10)    Aspiration Risk  No limitations    Diet Recommendation Thin liquid(Diet  may be advanced per GI's recommendation)   Liquid Administration via: Cup;Straw Medication Administration: Whole meds with liquid Supervision: Patient able to self feed Postural Changes: Seated upright at 90 degrees    Other  Recommendations Oral Care Recommendations: Oral care BID   Follow up Recommendations None      Frequency and Duration            Prognosis        Swallow Study   General Date of Onset: 04/24/19 HPI: Pt is a 69 y.o.  female with medical history significant of SBO, HTN, HLD, depression, CAD with stents, COPD, ESRD on TTS HD,  and Afib who presented with abdominal pain. She was admitted last time on 2/26 and was discharged on 3/3.  In that hospitalization, she underwent EGD which showed gastritis, esophagitis, multiple duodenal ulcers.  She was started on PPI and Carafate and she was taking them. CT of the abdomen: 18 mm ulcer protruding posteriorly just distal to the duodenal bulb. Fluid surrounding this ulcer and extending around the gallbladder which could represent impending perforation. CXR of 04/23/19 No active disease. WBC WNL and pt afebrile at time of eval.  Type of Study: Bedside Swallow Evaluation Diet Prior to this Study: Thin liquids(Clear liquids) Temperature Spikes Noted: No Respiratory Status: Room air History of Recent Intubation: No Behavior/Cognition: Cooperative;Alert;Pleasant mood Oral Cavity Assessment: Within Functional Limits Oral Care Completed by SLP: No Oral Cavity - Dentition: Dentures, top;Adequate natural dentition;Missing dentition Vision: Functional for self-feeding Self-Feeding Abilities: Able to feed self Patient Positioning: Upright in bed;Postural control adequate for testing Baseline Vocal Quality: Normal Volitional Cough: Strong Volitional Swallow: Able to elicit    Oral/Motor/Sensory Function Overall Oral Motor/Sensory Function: Within functional limits   Ice Chips Ice chips: Within functional limits Presentation: Spoon   Thin Liquid Thin Liquid: Within functional limits Presentation: Cup;Spoon;Straw    Nectar Thick Nectar Thick Liquid: Not tested   Honey Thick Honey Thick Liquid: Not tested   Puree Puree: Not tested   Solid     Solid: Not tested     Tobie Poet I. Hardin Negus, [place], [place] Office number [phone] Pager [phone]  Horton Marshall 04/25/2019,4:50 PM

## 2019-04-25 NOTE — Progress Notes (Signed)
PROGRESS NOTE    Christina [name]  EPP:[id number] DOB: 26-Jun-1950 DOA: 04/23/2019 PCP: Maggie Schwalbe, PA-C   Brief Narrative:  Patient is a 69 year old female with history of SBO, hypertension, hyperlipidemia, depression, coronary disease with stents, COPD, ESRD on TTS schedule dialysis, paroxysmal A. fib who presents with complaints of abdominal pain.  She was admitted last time on 2/26 and was discharged on 3/3.  In that hospitalization, she underwent EGD which showed gastritis, esophagitis, multiple duodenal ulcers.  She was started on PPI and Carafate and she was taking them.CT abdomen on this admission showed duodenal  ulcer, close to perforation.  GI, general surgery consulted.  Started on Protonix drip.  Vascular surgery also consulted today for finding of possible ischemic etiology for her ulceration.  Assessment & Plan:   Principal Problem:   Duodenal ulcer perforation (HCC) Active Problems:   ESRD (end stage renal disease) ([place])   Essential hypertension   Hyperlipidemia   Abdominal pain   AF (paroxysmal atrial fibrillation) (HCC)   Cocaine abuse (HCC)    Duodenal Ulcer: CT Findings consistent with an 18 mm ulcer protruding posteriorly just distal to the duodenal bulb. Possibility of  impending perforation. She has been admitted multiple times for abdominal pain, GI bleed requiring transfusion in the last 2 months.  EGD on 2/27 performed by Dr. Bryan Lemma showed grade B esophagitis without bleeding, 2 cm hiatal hernia, mild gastritis, multiple ulcers in the duodenal bulb. The largest ulcer was 15 mm and deeply cratered; it was too large for endoscopic intervention and was recommended for IR or surgical consultation if bleeding recurred.  These findings were concerning for ischemic etiology. CTA done on 03/19/2019 showed high-grade stenosis, stenosis of the SMA,complete occlusion of celiac axis, inferior mesenteric arteries.  Biopsies of ulcer didnt show malignancy. Patient  on twice daily PPI and Carafate and she is compliant with that.  General surgery, GI consulted. Continue Protonix drip.  Startedon clear liquid diet.  Vascular surgery also consulted for above findings. Vascular surgery planning for abdominal aortogram, mesenteric angiogram with possible intervention on Wednesday.  A. fib with RVR: Presented with A. fib with RVR.  History of proximal A. fib.  Currently heart rate is well controlled.  Poor candidate for anticoagulation due to large persistent duodenal ulcer.  On metoprolol for rate control.  ESRD on dialysis: Dialyzed on TTS schedule.  Nephrology following.  Coronary artery disease: Denies any chest pain at present.  Continue current management  Hypertension:Monitor BP.  Continue her home meds  History of COPD: Not on exacerbation at present.  Continue Combivent, Symbicort  Hyperlipidemia: On Pravachol at home.  Cocaine abuse: UDS positive for cocaine on 2/26.  Counseled for cessation  Peripheral vascular disease: Vascular surgery also planning to obtain bilateral ABIs.           DVT prophylaxis:SCD Code Status: Full Family Communication: None present at bedside.  Patient communicating with her family Disposition Plan: Patient is from home.   Expected discharge plan to home after full work-up.   Consultants: GI,general surgery,Nephrology  Procedures:None  Antimicrobials:  Anti-infectives (From admission, onward)   None      Subjective: Patient seen and examined at the bedside this morning.  Comfortable.  Hemodynamically stable.  Denies any abdomen pain.  Hemoglobin has remained stable.  Objective: Vitals:   04/24/19 2259 04/24/19 2303 04/25/19 0344 04/25/19 0748  BP: (!) 168/112 (!) 168/112 (!) 150/70   Pulse: 81 80 69   Resp:  Temp:  98.1 F (36.7 C) 98.2 F (36.8 C)   TempSrc:  Axillary Oral   SpO2: 100% 100% 100% 99%  Weight:      Height:        Intake/Output Summary (Last 24 hours) at 04/25/2019  0809 Last data filed at 04/24/2019 2303 Gross per 24 hour  Intake 620.7 ml  Output 1201 ml  Net -580.3 ml   Filed Weights   04/24/19 0511 04/24/19 1845 04/24/19 2236  Weight: 41.2 kg 41.1 kg 40.3 kg    Examination:   General exam: Comfortable Respiratory system:  no wheezes or crackles  Cardiovascular system: S1 & S2 heard, RRR Gastrointestinal system: Abdomen is nondistended, soft and has mild tender mainly on the epigastric region. No organomegaly or masses felt. Normal bowel sounds heard. Central nervous system: Alert and oriented. No focal neurological deficits. Extremities: No edema, no clubbing ,no cyanosis Skin: No rashes, lesions or ulcers,no icterus ,no pallor   Data Reviewed: I have personally reviewed following labs and imaging studies  CBC: Recent Labs  Lab 04/23/19 1245 04/24/19 0010 04/24/19 1810 04/25/19 0328  WBC 9.0 7.7  --  5.3  NEUTROABS  --   --   --  3.3  HGB 13.2 11.0* 12.4 12.1  HCT 43.9 36.6 41.2 39.5  MCV 93.0 92.2  --  90.0  PLT 265 283  --  357   Basic Metabolic Panel: Recent Labs  Lab 04/23/19 1245 04/24/19 1810 04/25/19 0328  NA 136  --  139  K 4.3  --  3.5  CL 99  --  99  CO2 22  --  26  GLUCOSE 117*  --  76  BUN 31*  --  10  CREATININE 5.24*  --  3.04*  CALCIUM 8.8*  --  8.4*  PHOS  --  6.3*  --    GFR: Estimated Creatinine Clearance: 11.3 mL/min (A) (by C-G formula based on SCr of 3.04 mg/dL (H)). Liver Function Tests: Recent Labs  Lab 04/23/19 1245  AST 19  ALT 12  ALKPHOS 69  BILITOT 0.5  PROT 6.5  ALBUMIN 3.0*   Recent Labs  Lab 04/23/19 1245  LIPASE 76*   No results for input(s): AMMONIA in the last 168 hours. Coagulation Profile: No results for input(s): INR, PROTIME in the last 168 hours. Cardiac Enzymes: No results for input(s): CKTOTAL, CKMB, CKMBINDEX, TROPONINI in the last 168 hours. BNP (last 3 results) No results for input(s): PROBNP in the last 8760 hours. HbA1C: No results for input(s):  HGBA1C in the last 72 hours. CBG: Recent Labs  Lab 04/23/19 1232  GLUCAP 111*   Lipid Profile: No results for input(s): CHOL, HDL, LDLCALC, TRIG, CHOLHDL, LDLDIRECT in the last 72 hours. Thyroid Function Tests: No results for input(s): TSH, T4TOTAL, FREET4, T3FREE, THYROIDAB in the last 72 hours. Anemia Panel: No results for input(s): VITAMINB12, FOLATE, FERRITIN, TIBC, IRON, RETICCTPCT in the last 72 hours. Sepsis Labs: No results for input(s): PROCALCITON, LATICACIDVEN in the last 168 hours.  Recent Results (from the past 240 hour(s))  Respiratory Panel by RT PCR (Flu A&B, Covid) - Nasopharyngeal Swab     Status: None   Collection Time: 04/23/19  5:55 PM   Specimen: Nasopharyngeal Swab  Result Value Ref Range Status   SARS Coronavirus 2 by RT PCR NEGATIVE NEGATIVE Final    Comment: (NOTE) SARS-CoV-2 target nucleic acids are NOT DETECTED. The SARS-CoV-2 RNA is generally detectable in upper respiratoy specimens during the acute phase of  infection. The lowest concentration of SARS-CoV-2 viral copies this assay can detect is 131 copies/mL. A negative result does not preclude SARS-Cov-2 infection and should not be used as the sole basis for treatment or other patient management decisions. A negative result may occur with  improper specimen collection/handling, submission of specimen other than nasopharyngeal swab, presence of viral mutation(s) within the areas targeted by this assay, and inadequate number of viral copies (<131 copies/mL). A negative result must be combined with clinical observations, patient history, and epidemiological information. The expected result is Negative. Fact Sheet for Patients:  PinkCheek.be Fact Sheet for Healthcare Providers:  GravelBags.it This test is not yet ap proved or cleared by the [place] FDA and  has been authorized for detection and/or diagnosis of SARS-CoV-2 by FDA under an  Emergency Use Authorization (EUA). This EUA will remain  in effect (meaning this test can be used) for the duration of the COVID-19 declaration under Section 564(b)(1) of the Act, 21 U.S.C. section 360bbb-3(b)(1), unless the authorization is terminated or revoked sooner.    Influenza A by PCR NEGATIVE NEGATIVE Final   Influenza B by PCR NEGATIVE NEGATIVE Final    Comment: (NOTE) The Xpert Xpress SARS-CoV-2/FLU/RSV assay is intended as an aid in  the diagnosis of influenza from Nasopharyngeal swab specimens and  should not be used as a sole basis for treatment. Nasal washings and  aspirates are unacceptable for Xpert Xpress SARS-CoV-2/FLU/RSV  testing. Fact Sheet for Patients: PinkCheek.be Fact Sheet for Healthcare Providers: GravelBags.it This test is not yet approved or cleared by the [place] FDA and  has been authorized for detection and/or diagnosis of SARS-CoV-2 by  FDA under an Emergency Use Authorization (EUA). This EUA will remain  in effect (meaning this test can be used) for the duration of the  Covid-19 declaration under Section 564(b)(1) of the Act, 21  U.S.C. section 360bbb-3(b)(1), unless the authorization is  terminated or revoked. Performed at [facility], [place] [address], [place], [place] [address]          Radiology Studies: CT ABDOMEN PELVIS W CONTRAST  Result Date: 04/23/2019 CLINICAL DATA:  Nausea, vomiting, and diarrhea. Tachycardia. EXAM: CT ABDOMEN AND PELVIS WITH CONTRAST 2:19 p.m. TECHNIQUE: Multidetector CT imaging of the abdomen and pelvis was performed using the standard protocol following bolus administration of intravenous contrast. CONTRAST:  133mL OMNIPAQUE IOHEXOL 300 MG/ML  SOLN COMPARISON:  CT scans dated 04/23/2019 at 4:14 a.m. at [facility] and 03/06/2019 and 03/25/2019 FINDINGS: Lower chest: No acute abnormality. Severe atherosclerosis of the distal thoracic aorta.  Slight linear scarring at the left lung base, stable. Hepatobiliary, stomach/Bowel: A small amount of fluid surrounds the contracted gallbladder. The gallbladder has contracted slightly since the prior study. The fluid extends around the duodenum just distal to the duodenal bulb. Slight prominence of multiple distal small bowel loops without obstruction. This is nonspecific. Appendix has been removed. There appears to be an 18 mm ulcer protruding posteriorly just distal to the duodenal bulb best seen on image 38 of series 7 and image 25 of series 3 and image 42 of series 6. This was not definable on the prior study done earlier today. Pancreas: Unremarkable. No pancreatic ductal dilatation or surrounding inflammatory changes. Spleen: Normal in size without focal abnormality. Adrenals/Urinary Tract: Bilateral adrenal adenomas, unchanged. Multiple bilateral renal cysts. No hydronephrosis. Bladder is normal. Vascular/Lymphatic: Extensive aortic atherosclerosis. No adenopathy. Reproductive: Status post hysterectomy. No adnexal masses. Other: No ascites. No abdominal wall hernias. Musculoskeletal:  No acute or significant osseous findings. IMPRESSION: 1. Findings consistent with an 18 mm ulcer protruding posteriorly just distal to the duodenal bulb. There is fluid surrounding this ulcer and extending around the gallbladder which could represent impending perforation. 2. Small amount of fluid surrounds the contracted gallbladder. 3. Slight prominence of multiple distal small bowel loops without obstruction. This is nonspecific. 4. Stable bilateral adrenal adenomas. 5. Severe atherosclerosis of the distal thoracic aorta. Aortic Atherosclerosis (ICD10-I70.0). Electronically Signed   By: Lorriane Shire M.D.   On: 04/23/2019 15:09   DG Chest Port 1 View  Result Date: 04/23/2019 CLINICAL DATA:  Abdominal pain EXAM: PORTABLE CHEST 1 VIEW COMPARISON:  Chest radiograph dated 04/11/2019 FINDINGS: The heart is enlarged. Vascular  calcifications are seen in the aortic arch. Both lungs are clear. The visualized skeletal structures are unremarkable. A right internal jugular central venous catheter tip overlies the right atrium. IMPRESSION: No active disease. Electronically Signed   By: Zerita Boers M.D.   On: 04/23/2019 13:39        Scheduled Meds: . amLODipine  10 mg Oral QHS  . [START ON 04/26/2019] calcitRIOL  1 mcg Oral Q T,Th,Sa-HD  . Chlorhexidine Gluconate Cloth  6 each Topical Q0600  . heparin      . metoprolol tartrate  100 mg Oral BID  . mometasone-formoterol  2 puff Inhalation BID  . [START ON 04/27/2019] pantoprazole  40 mg Intravenous Q12H  . sodium chloride flush  3 mL Intravenous Q12H  . sucralfate  1 g Oral Q6H   Continuous Infusions: . pantoprozole (PROTONIX) infusion 8 mg/hr (04/25/19 0334)     LOS: 2 days    Time spent: 35 mins.More than 50% of that time was spent in counseling and/or coordination of care.      Shelly Coss, MD [facility] P3/29/2021, 8:09 AM

## 2019-04-25 NOTE — Progress Notes (Signed)
[facility] Progress Note     Subjective: CC-  Up walking around her room. Abdomen feeling better than yesterday. States that pain was initially more diffuse, now just mild epigastric. Denies n/v. Started on clears yesterday but due to dialysis she has not gotten to drink much of anything yet.  WBC 5.3, afebrile  Objective: Vital signs in last 24 hours: Temp:  [97.6 F (36.4 C)-98.2 F (36.8 C)] 97.8 F (36.6 C) (03/29 0835) Pulse Rate:  [60-81] 65 (03/29 0835) Resp:  [17-20] 18 (03/29 0835) BP: (133-193)/(53-112) 151/63 (03/29 0835) SpO2:  [96 %-100 %] 100 % (03/29 0835) Weight:  [40.3 kg-41.1 kg] 40.3 kg (03/28 2236) Last BM Date: 04/23/19  Intake/Output from previous day: 03/28 0701 - 03/29 0700 In: 620.7 [P.O.:490; I.V.:130.7] Out: 1201 [Urine:1] Intake/Output this shift: No intake/output data recorded.  PE: Gen:  Alert, NAD, pleasant HEENT: EOM's intact, pupils equal and round Pulm: rate and effort normal Abd: Soft, ND, +BS, no HSM, mild subjective epigastric TTP Skin: no rashes noted, warm and dry  Lab Results:  Recent Labs    04/24/19 0010 04/24/19 0010 04/24/19 1810 04/25/19 0328  WBC 7.7  --   --  5.3  HGB 11.0*   < > 12.4 12.1  HCT 36.6   < > 41.2 39.5  PLT 283  --   --  255   < > = values in this interval not displayed.   BMET Recent Labs    04/23/19 1245 04/25/19 0328  NA 136 139  K 4.3 3.5  CL 99 99  CO2 22 26  GLUCOSE 117* 76  BUN 31* 10  CREATININE 5.24* 3.04*  CALCIUM 8.8* 8.4*   PT/INR No results for input(s): LABPROT, INR in the last 72 hours. CMP     Component Value Date/Time   NA 139 04/25/2019 0328   K 3.5 04/25/2019 0328   CL 99 04/25/2019 0328   CO2 26 04/25/2019 0328   GLUCOSE 76 04/25/2019 0328   BUN 10 04/25/2019 0328   CREATININE 3.04 (H) 04/25/2019 0328   CALCIUM 8.4 (L) 04/25/2019 0328   PROT 6.5 04/23/2019 1245   ALBUMIN 3.0 (L) 04/23/2019 1245   AST 19 04/23/2019 1245   ALT 12 04/23/2019 1245   ALKPHOS 69 04/23/2019 1245   BILITOT 0.5 04/23/2019 1245   GFRNONAA 15 (L) 04/25/2019 0328   GFRAA 17 (L) 04/25/2019 0328   Lipase     Component Value Date/Time   LIPASE 76 (H) 04/23/2019 1245       Studies/Results: CT ABDOMEN PELVIS W CONTRAST  Result Date: 04/23/2019 CLINICAL DATA:  Nausea, vomiting, and diarrhea. Tachycardia. EXAM: CT ABDOMEN AND PELVIS WITH CONTRAST 2:19 p.m. TECHNIQUE: Multidetector CT imaging of the abdomen and pelvis was performed using the standard protocol following bolus administration of intravenous contrast. CONTRAST:  170mL OMNIPAQUE IOHEXOL 300 MG/ML  SOLN COMPARISON:  CT scans dated 04/23/2019 at 4:14 a.m. at [facility] and 03/06/2019 and 03/25/2019 FINDINGS: Lower chest: No acute abnormality. Severe atherosclerosis of the distal thoracic aorta. Slight linear scarring at the left lung base, stable. Hepatobiliary, stomach/Bowel: A small amount of fluid surrounds the contracted gallbladder. The gallbladder has contracted slightly since the prior study. The fluid extends around the duodenum just distal to the duodenal bulb. Slight prominence of multiple distal small bowel loops without obstruction. This is nonspecific. Appendix has been removed. There appears to be an 18 mm ulcer protruding posteriorly just distal to the duodenal bulb best seen on image  38 of series 7 and image 25 of series 3 and image 42 of series 6. This was not definable on the prior study done earlier today. Pancreas: Unremarkable. No pancreatic ductal dilatation or surrounding inflammatory changes. Spleen: Normal in size without focal abnormality. Adrenals/Urinary Tract: Bilateral adrenal adenomas, unchanged. Multiple bilateral renal cysts. No hydronephrosis. Bladder is normal. Vascular/Lymphatic: Extensive aortic atherosclerosis. No adenopathy. Reproductive: Status post hysterectomy. No adnexal masses. Other: No ascites. No abdominal wall hernias. Musculoskeletal: No acute or significant  osseous findings. IMPRESSION: 1. Findings consistent with an 18 mm ulcer protruding posteriorly just distal to the duodenal bulb. There is fluid surrounding this ulcer and extending around the gallbladder which could represent impending perforation. 2. Small amount of fluid surrounds the contracted gallbladder. 3. Slight prominence of multiple distal small bowel loops without obstruction. This is nonspecific. 4. Stable bilateral adrenal adenomas. 5. Severe atherosclerosis of the distal thoracic aorta. Aortic Atherosclerosis (ICD10-I70.0). Electronically Signed   By: Lorriane Shire M.D.   On: 04/23/2019 15:09   DG Chest Port 1 View  Result Date: 04/23/2019 CLINICAL DATA:  Abdominal pain EXAM: PORTABLE CHEST 1 VIEW COMPARISON:  Chest radiograph dated 04/11/2019 FINDINGS: The heart is enlarged. Vascular calcifications are seen in the aortic arch. Both lungs are clear. The visualized skeletal structures are unremarkable. A right internal jugular central venous catheter tip overlies the right atrium. IMPRESSION: No active disease. Electronically Signed   By: Zerita Boers M.D.   On: 04/23/2019 13:39    Anti-infectives: Anti-infectives (From admission, onward)   None       Assessment/Plan ESRD on HD CAD HTN A fib COPD H/o SBO Severe abdominal atherosclerotic disease Protein calorie malnutrition  Duodenal ulcers - EGD 2/27 identified multiple duodenal ulcers to the duodenal bulb, largest measured 15 mm and was deeply cratered, too large for successful endoscopic intervention - CT 3/27 showed an 18 mm ulcer protruding posteriorly distal to the duodenal bulb with surrounding fluid concerning for impending perforation  ID - none FEN - IVF, CLD VTE - SCDs,  Foley - none Follow up - TBD  Plan - Abdominal pain improving, WBC WNL, essentially nontender on exam. Continue clear liquids, advance per GI. Continue carafate and protonix.   LOS: 2 days    [place]  [facility] 04/25/2019, 10:33 AM Please see Amion for pager number during day hours 7:00am-4:30pm

## 2019-04-25 NOTE — Progress Notes (Signed)
SLP Cancellation Note  Patient Details Name: Christina [name] MRN: [id number] DOB: 30-Aug-1950   Cancelled treatment:       Reason Eval/Treat Not Completed: Medical issues which prohibited therapy(Case d/w RN who indicated that the pt is NPO for GI workup. SLP will therefore follow up on subsequent date unless it is determined that pt can have p.o. trials for swallow evaluation.)  [place] Youngblood I. Hardin Negus, [place], [place] Office number [phone] Pager [place] 04/25/2019, 9:43 AM

## 2019-04-25 NOTE — Progress Notes (Signed)
Nephrology Progress Note:   36F ESRD THS at [facility] with epigastric pain and imaging concerning for large high risk duodenal ulcer  Subjective:  She had HD on 3/28 with kg UF.  States had abd pain with HD but none now.states they've used AVF twice and she used tunneled catheter yesterday   Review of systems  Denies any current n/v Denies shortness of breath or chest pain No current abd pain  03/28 0701 - 03/29 0700 In: 620.7 [P.O.:490; I.V.:130.7] Out: 1201 [Urine:1]  Filed Weights   04/24/19 0511 04/24/19 1845 04/24/19 2236  Weight: 41.2 kg 41.1 kg 40.3 kg    Scheduled Meds: . amLODipine  10 mg Oral QHS  . Chlorhexidine Gluconate Cloth  6 each Topical Q0600  . heparin      . metoprolol tartrate  100 mg Oral BID  . mometasone-formoterol  2 puff Inhalation BID  . [START ON 04/27/2019] pantoprazole  40 mg Intravenous Q12H  . sodium chloride flush  3 mL Intravenous Q12H  . sucralfate  1 g Oral Q6H   Continuous Infusions: . pantoprozole (PROTONIX) infusion 8 mg/hr (04/25/19 0334)   PRN Meds:.acetaminophen **OR** acetaminophen, camphor-menthol **AND** hydrOXYzine, ipratropium-albuterol, lidocaine-prilocaine, ondansetron **OR** ondansetron (ZOFRAN) IV, polyvinyl alcohol  Current Labs: reviewed    Physical Exam:  Blood pressure (!) 150/70, pulse 69, temperature 98.2 F (36.8 C), temperature source Oral, resp. rate 20, height 5\' 2"  (1.575 m), weight 40.3 kg, SpO2 99 %.  Gen - NAD, chronically ill appearing female in bed in NAD   HEENT NCAT Neck supple trachea midline  Heart S1S2 no rub Lungs - clear to auscultation and unlabored  Neuro alert and oriented x 3; provides hx and follows commands  Psych normal mood and affect  Ext No edema Access: R IJ TDC c/d/i, no erythema or drainage from exit site; AVF with bruit and thrill some bruising over site     Hemodialysis prescription: TTS, [facility], blood flow rate 300, AF 1.5, 2K/2.0 calcium, 4 hours, RIJ TDC, EDW 40.5  kg, no heparin, Mircera 150 mcg every 2 weeks (last dose 3/23), Venofer 50 mg once a week, calcitriol 1 mcg q. hemodialysis,   1. ESRD THS [place] [facility]  Plan for next HD per TTS schedule.  No heparin with HD and avoid hypotension with HD. 2. Duodenal ulcer, GI and CCS following. Would transition off of carafate with ESRD. Note GI concern for ischemic etiology of ulcer and rec vascular consult 3. HTN: improved; follow on current regimen  4. Anemia of CKD: Hb stable; no indication for ESA  5. CKD-BMD: hyperphosphatemia. Start calcitriol.    6. Substance abuse - note cocaine positive 03/25/19    Recent Labs  Lab 04/23/19 1245 04/24/19 1810 04/25/19 0328  NA 136  --  139  K 4.3  --  3.5  CL 99  --  99  CO2 22  --  26  GLUCOSE 117*  --  76  BUN 31*  --  10  CREATININE 5.24*  --  3.04*  CALCIUM 8.8*  --  8.4*  PHOS  --  6.3*  --    Recent Labs  Lab 04/23/19 1245 04/23/19 1245 04/24/19 0010 04/24/19 1810 04/25/19 0328  WBC 9.0  --  7.7  --  5.3  NEUTROABS  --   --   --   --  3.3  HGB 13.2   < > 11.0* 12.4 12.1  HCT 43.9   < > 36.6 41.2 39.5  MCV  93.0  --  92.2  --  90.0  PLT 265  --  283  --  255   < > = values in this interval not displayed.     Christina [name] 04/25/2019 8:12 AM

## 2019-04-26 LAB — CBC WITH DIFFERENTIAL/PLATELET
Abs Immature Granulocytes: 0.02 10*3/uL (ref 0.00–0.07)
Basophils Absolute: 0.1 10*3/uL (ref 0.0–0.1)
Basophils Relative: 1 %
Eosinophils Absolute: 0.2 10*3/uL (ref 0.0–0.5)
Eosinophils Relative: 4 %
HCT: 39.1 % (ref 36.0–46.0)
Hemoglobin: 12 g/dL (ref 12.0–15.0)
Immature Granulocytes: 0 %
Lymphocytes Relative: 29 %
Lymphs Abs: 1.5 10*3/uL (ref 0.7–4.0)
MCH: 27.8 pg (ref 26.0–34.0)
MCHC: 30.7 g/dL (ref 30.0–36.0)
MCV: 90.7 fL (ref 80.0–100.0)
Monocytes Absolute: 0.8 10*3/uL (ref 0.1–1.0)
Monocytes Relative: 15 %
Neutro Abs: 2.7 10*3/uL (ref 1.7–7.7)
Neutrophils Relative %: 51 %
Platelets: 268 10*3/uL (ref 150–400)
RBC: 4.31 MIL/uL (ref 3.87–5.11)
RDW: 16.7 % — ABNORMAL HIGH (ref 11.5–15.5)
WBC: 5.2 10*3/uL (ref 4.0–10.5)
nRBC: 0 % (ref 0.0–0.2)

## 2019-04-26 LAB — RENAL FUNCTION PANEL
Albumin: 3.2 g/dL — ABNORMAL LOW (ref 3.5–5.0)
Anion gap: 17 — ABNORMAL HIGH (ref 5–15)
BUN: 14 mg/dL (ref 8–23)
CO2: 19 mmol/L — ABNORMAL LOW (ref 22–32)
Calcium: 8.5 mg/dL — ABNORMAL LOW (ref 8.9–10.3)
Chloride: 100 mmol/L (ref 98–111)
Creatinine, Ser: 4.68 mg/dL — ABNORMAL HIGH (ref 0.44–1.00)
GFR calc Af Amer: 10 mL/min — ABNORMAL LOW (ref >60)
GFR calc non Af Amer: 9 mL/min — ABNORMAL LOW (ref >60)
Glucose, Bld: 74 mg/dL (ref 70–99)
Phosphorus: 5.9 mg/dL — ABNORMAL HIGH (ref 2.5–4.6)
Potassium: 3.6 mmol/L (ref 3.5–5.1)
Sodium: 136 mmol/L (ref 135–145)

## 2019-04-26 LAB — HEPATITIS B SURFACE ANTIGEN: Hepatitis B Surface Ag: NONREACTIVE

## 2019-04-26 MED ORDER — HEPARIN SODIUM (PORCINE) 1000 UNIT/ML IJ SOLN
1000.0000 [IU] | INTRAMUSCULAR | Status: DC | PRN
  Administered 2019-04-26 – 2019-04-28 (×2): 3200 [IU] via INTRAVENOUS
  Filled 2019-04-26 (×2): qty 1

## 2019-04-26 MED ORDER — MOMETASONE FURO-FORMOTEROL FUM 100-5 MCG/ACT IN AERO
2.0000 | INHALATION_SPRAY | Freq: Two times a day (BID) | RESPIRATORY_TRACT | Status: DC
  Administered 2019-04-28 – 2019-04-29 (×3): 2 via RESPIRATORY_TRACT
  Filled 2019-04-26 (×2): qty 8.8

## 2019-04-26 MED ORDER — HEPARIN SODIUM (PORCINE) 1000 UNIT/ML IJ SOLN
INTRAMUSCULAR | Status: AC
  Filled 2019-04-26: qty 4

## 2019-04-26 MED ORDER — CALCITRIOL 0.5 MCG PO CAPS
ORAL_CAPSULE | ORAL | Status: AC
  Filled 2019-04-26: qty 2

## 2019-04-26 NOTE — Progress Notes (Signed)
PROGRESS NOTE    Christina [name]  IWP:[id number] DOB: 04/09/1950 DOA: 04/23/2019 PCP: Maggie Schwalbe, PA-C   Brief Narrative:  Patient is a 69 year old female with history of SBO, hypertension, hyperlipidemia, depression, coronary disease with stents, COPD, ESRD on TTS schedule dialysis, paroxysmal A. fib who presents with complaints of abdominal pain.  She was admitted last time on 2/26 and was discharged on 3/3.  In that hospitalization, she underwent EGD which showed gastritis, esophagitis, multiple duodenal ulcers.  She was started on PPI and Carafate and she was taking them.CT abdomen on this admission showed duodenal  ulcer, close to perforation.  GI, general surgery consulted.  Started on Protonix drip.  Vascular surgery also consulted  for finding of possible ischemic etiology for her ulceration.  Plan for mesenteric angiogram on 04/27/19.  Assessment & Plan:   Principal Problem:   Duodenal ulcer perforation (HCC) Active Problems:   ESRD (end stage renal disease) ([place])   Essential hypertension   Hyperlipidemia   Abdominal pain   AF (paroxysmal atrial fibrillation) (HCC)   Cocaine abuse ([place])   Superior mesenteric artery atherosclerosis (HCC)    Duodenal Ulcer: CT Findings consistent with an 18 mm ulcer protruding posteriorly just distal to the duodenal bulb. Possibility of  impending perforation. She has been admitted multiple times for abdominal pain, GI bleed requiring transfusion in the last 2 months.  EGD on 2/27 performed by Dr. Bryan Lemma showed grade B esophagitis without bleeding, 2 cm hiatal hernia, mild gastritis, multiple ulcers in the duodenal bulb. The largest ulcer was 15 mm and deeply cratered; it was too large for endoscopic intervention and was recommended for IR or surgical consultation if bleeding recurred.  These findings were concerning for ischemic etiology. CTA done on 03/19/2019 showed high-grade stenosis, stenosis of the SMA,complete occlusion of  celiac axis, inferior mesenteric arteries.  Biopsies of ulcer didnt show malignancy. Patient on twice daily PPI and Carafate and she is compliant with that.  General surgery, GI consulted. Continue Protonix drip.  Currently on clear liquid diet.  Vascular surgery also consulted for above findings. Vascular surgery planning for abdominal aortogram, mesenteric angiogram with possible intervention on 04/27/19.  A. fib with RVR: Presented with A. fib with RVR.  History of proximal A. fib.  Currently heart rate is well controlled.  Poor candidate for anticoagulation due to large persistent duodenal ulcer.  On metoprolol for rate control.  ESRD on dialysis: Dialyzed on TTS schedule.  Nephrology following.  Being dialyzed today.  Coronary artery disease: Denies any chest pain at present.  Continue current management  Hypertension:Monitor BP.  Continue her home meds  History of COPD: Not on exacerbation at present.  Continue Combivent, Symbicort  Hyperlipidemia: On Pravachol at home.  Cocaine abuse: UDS positive for cocaine on 2/26.  Counseled for cessation  Peripheral vascular disease: Vascular surgery also planning to obtain bilateral ABIs.           DVT prophylaxis:SCD Code Status: Full Family Communication: None present at bedside.  Patient communicating with her family Disposition Plan: Patient is from home.   Expected discharge plan to home after full work-up.   Consultants: GI,general surgery,Nephrology  Procedures:None  Antimicrobials:  Anti-infectives (From admission, onward)   None      Subjective: Patient seen and examined at the bedside this morning.  Hemodynamically stable.  Comfortable but denies any complaints like abdominal pain, nausea or vomiting.  Objective: Vitals:   04/25/19 1432 04/25/19 1908 04/26/19 0314 04/26/19 0537  BP: Marland Kitchen)  146/68 (!) 166/71 115/69   Pulse: 60 65 61   Resp: 18 14 18    Temp: 98.3 F (36.8 C) 98.1 F (36.7 C) 97.6 F (36.4 C)     TempSrc: Oral Oral Oral   SpO2: 100% 100% 100%   Weight:    40.2 kg  Height:        Intake/Output Summary (Last 24 hours) at 04/26/2019 0737 Last data filed at 04/26/2019 0700 Gross per 24 hour  Intake 310 ml  Output --  Net 310 ml   Filed Weights   04/24/19 1845 04/24/19 2236 04/26/19 0537  Weight: 41.1 kg 40.3 kg 40.2 kg    Examination:   General exam: Comfortble Respiratory system: no wheezes or crackles  Cardiovascular system: S1 & S2 heard, RRR. No JVD, murmurs, rubs, gallops or clicks.  Dialysis catheter on the right chest Gastrointestinal system: Abdomen is nondistended, soft and nontender.  Central nervous system: Alert and oriented. Extremities: No edema, no clubbing ,no cyanosis, Skin: No rashes, lesions or ulcers,no icterus ,no pallor   Data Reviewed: I have personally reviewed following labs and imaging studies  CBC: Recent Labs  Lab 04/23/19 1245 04/24/19 0010 04/24/19 1810 04/25/19 0328 04/26/19 0255  WBC 9.0 7.7  --  5.3 5.2  NEUTROABS  --   --   --  3.3 2.7  HGB 13.2 11.0* 12.4 12.1 12.0  HCT 43.9 36.6 41.2 39.5 39.1  MCV 93.0 92.2  --  90.0 90.7  PLT 265 283  --  255 127   Basic Metabolic Panel: Recent Labs  Lab 04/23/19 1245 04/24/19 1810 04/25/19 0328 04/26/19 0255  NA 136  --  139 136  K 4.3  --  3.5 3.6  CL 99  --  99 100  CO2 22  --  26 19*  GLUCOSE 117*  --  76 74  BUN 31*  --  10 14  CREATININE 5.24*  --  3.04* 4.68*  CALCIUM 8.8*  --  8.4* 8.5*  PHOS  --  6.3*  --  5.9*   GFR: Estimated Creatinine Clearance: 7.3 mL/min (A) (by C-G formula based on SCr of 4.68 mg/dL (H)). Liver Function Tests: Recent Labs  Lab 04/23/19 1245 04/26/19 0255  AST 19  --   ALT 12  --   ALKPHOS 69  --   BILITOT 0.5  --   PROT 6.5  --   ALBUMIN 3.0* 3.2*   Recent Labs  Lab 04/23/19 1245  LIPASE 76*   No results for input(s): AMMONIA in the last 168 hours. Coagulation Profile: No results for input(s): INR, PROTIME in the last 168  hours. Cardiac Enzymes: No results for input(s): CKTOTAL, CKMB, CKMBINDEX, TROPONINI in the last 168 hours. BNP (last 3 results) No results for input(s): PROBNP in the last 8760 hours. HbA1C: No results for input(s): HGBA1C in the last 72 hours. CBG: Recent Labs  Lab 04/23/19 1232  GLUCAP 111*   Lipid Profile: No results for input(s): CHOL, HDL, LDLCALC, TRIG, CHOLHDL, LDLDIRECT in the last 72 hours. Thyroid Function Tests: No results for input(s): TSH, T4TOTAL, FREET4, T3FREE, THYROIDAB in the last 72 hours. Anemia Panel: No results for input(s): VITAMINB12, FOLATE, FERRITIN, TIBC, IRON, RETICCTPCT in the last 72 hours. Sepsis Labs: No results for input(s): PROCALCITON, LATICACIDVEN in the last 168 hours.  Recent Results (from the past 240 hour(s))  Respiratory Panel by RT PCR (Flu A&B, Covid) - Nasopharyngeal Swab     Status: None   Collection Time:  04/23/19  5:55 PM   Specimen: Nasopharyngeal Swab  Result Value Ref Range Status   SARS Coronavirus 2 by RT PCR NEGATIVE NEGATIVE Final    Comment: (NOTE) SARS-CoV-2 target nucleic acids are NOT DETECTED. The SARS-CoV-2 RNA is generally detectable in upper respiratoy specimens during the acute phase of infection. The lowest concentration of SARS-CoV-2 viral copies this assay can detect is 131 copies/mL. A negative result does not preclude SARS-Cov-2 infection and should not be used as the sole basis for treatment or other patient management decisions. A negative result may occur with  improper specimen collection/handling, submission of specimen other than nasopharyngeal swab, presence of viral mutation(s) within the areas targeted by this assay, and inadequate number of viral copies (<131 copies/mL). A negative result must be combined with clinical observations, patient history, and epidemiological information. The expected result is Negative. Fact Sheet for Patients:  PinkCheek.be Fact Sheet for  Healthcare Providers:  GravelBags.it This test is not yet ap proved or cleared by the [place] FDA and  has been authorized for detection and/or diagnosis of SARS-CoV-2 by FDA under an Emergency Use Authorization (EUA). This EUA will remain  in effect (meaning this test can be used) for the duration of the COVID-19 declaration under Section 564(b)(1) of the Act, 21 U.S.C. section 360bbb-3(b)(1), unless the authorization is terminated or revoked sooner.    Influenza A by PCR NEGATIVE NEGATIVE Final   Influenza B by PCR NEGATIVE NEGATIVE Final    Comment: (NOTE) The Xpert Xpress SARS-CoV-2/FLU/RSV assay is intended as an aid in  the diagnosis of influenza from Nasopharyngeal swab specimens and  should not be used as a sole basis for treatment. Nasal washings and  aspirates are unacceptable for Xpert Xpress SARS-CoV-2/FLU/RSV  testing. Fact Sheet for Patients: PinkCheek.be Fact Sheet for Healthcare Providers: GravelBags.it This test is not yet approved or cleared by the [place] FDA and  has been authorized for detection and/or diagnosis of SARS-CoV-2 by  FDA under an Emergency Use Authorization (EUA). This EUA will remain  in effect (meaning this test can be used) for the duration of the  Covid-19 declaration under Section 564(b)(1) of the Act, 21  U.S.C. section 360bbb-3(b)(1), unless the authorization is  terminated or revoked. Performed at [facility], [place] [address], [place], [place] [address]          Radiology Studies: VAS [place] ABI WITH/WO TBI  Result Date: 04/25/2019 LOWER EXTREMITY DOPPLER STUDY Indications: Claudication. High Risk         Hypertension, hyperlipidemia, past history of smoking, prior Factors:          MI.  Comparison Study: No prior exam. Performing Technologist: Baldwin Crown RVT, RDMS  Examination Guidelines: A complete evaluation includes at  minimum, Doppler waveform signals and systolic blood pressure reading at the level of bilateral brachial, anterior tibial, and posterior tibial arteries, when vessel segments are accessible. Bilateral testing is considered an integral part of a complete examination. Photoelectric Plethysmograph (PPG) waveforms and toe systolic pressure readings are included as required and additional duplex testing as needed. Limited examinations for reoccurring indications may be performed as noted.  ABI Findings: +---------+------------------+-----+-------------------+--------+ Right    Rt Pressure (mmHg)IndexWaveform           Comment  +---------+------------------+-----+-------------------+--------+ Brachial 122                    triphasic                   +---------+------------------+-----+-------------------+--------+  PTA      104               0.85 dampened monophasic         +---------+------------------+-----+-------------------+--------+ DP       114               0.93 dampened monophasic         +---------+------------------+-----+-------------------+--------+ Great Toe43                0.35 Abnormal                    +---------+------------------+-----+-------------------+--------+ +---------+------------------+-----+-------------------+-------------+ Left     Lt Pressure (mmHg)IndexWaveform           Comment       +---------+------------------+-----+-------------------+-------------+ Brachial                                           UTO - fistula +---------+------------------+-----+-------------------+-------------+ PTA      130               1.07 monophasic                       +---------+------------------+-----+-------------------+-------------+ DP       92                0.75 dampened monophasic              +---------+------------------+-----+-------------------+-------------+ Great Toe59                0.48 Abnormal                          +---------+------------------+-----+-------------------+-------------+ +-------+-----------+-----------+------------+------------+ ABI/TBIToday's ABIToday's TBIPrevious ABIPrevious TBI +-------+-----------+-----------+------------+------------+ Right  0.93       0.35                                +-------+-----------+-----------+------------+------------+ Left   1.07       0.48                                +-------+-----------+-----------+------------+------------+  Summary: Right: Resting right ankle-brachial index indicates mild right lower extremity arterial disease. The right toe-brachial index is abnormal. Left: Resting left ankle-brachial index is within normal range. No evidence of significant left lower extremity arterial disease. The left toe-brachial index is abnormal.  *See table(s) above for measurements and observations.  Electronically signed by Ruta Hinds MD on 04/25/2019 at 8:29:38 PM.    Final         Scheduled Meds: . amLODipine  10 mg Oral QHS  . calcitRIOL  1 mcg Oral Q T,Th,Sa-HD  . Chlorhexidine Gluconate Cloth  6 each Topical Q0600  . Chlorhexidine Gluconate Cloth  6 each Topical Q0600  . metoprolol tartrate  100 mg Oral BID  . mometasone-formoterol  2 puff Inhalation BID  . [START ON 04/27/2019] pantoprazole  40 mg Intravenous Q12H  . polyethylene glycol  17 g Oral Daily  . sodium chloride flush  3 mL Intravenous Q12H  . sucralfate  1 g Oral Q6H   Continuous Infusions: . pantoprozole (PROTONIX) infusion 8 mg/hr (04/25/19 2111)     LOS: 3 days    Time spent: 35 mins.More than 50%  of that time was spent in counseling and/or coordination of care.      Shelly Coss, MD [facility] P3/30/2021, 7:37 AM

## 2019-04-26 NOTE — Progress Notes (Signed)
Nephrology Progress Note:   40F ESRD THS at [facility] with epigastric pain and imaging concerning for large high risk duodenal ulcer  Subjective:  She had HD on 3/28 with kg UF.  States had abd pain with HD at that time.  She denies abd pain today.  States additional imaging is planned for tomorrow and per vascular note plan is for abdominal aortogram mesenteric angiogram with possible intervention on 3/31.  Review of systems  Denies any current n/v Denies shortness of breath or chest pain No current abd pain  03/29 0701 - 03/30 0700 In: 310 [P.O.:220; I.V.:90] Out: -   Filed Weights   04/24/19 1845 04/24/19 2236 04/26/19 0537  Weight: 41.1 kg 40.3 kg 40.2 kg    Scheduled Meds: . amLODipine  10 mg Oral QHS  . calcitRIOL  1 mcg Oral Q T,Th,Sa-HD  . Chlorhexidine Gluconate Cloth  6 each Topical Q0600  . Chlorhexidine Gluconate Cloth  6 each Topical Q0600  . metoprolol tartrate  100 mg Oral BID  . mometasone-formoterol  2 puff Inhalation BID  . [START ON 04/27/2019] pantoprazole  40 mg Intravenous Q12H  . polyethylene glycol  17 g Oral Daily  . sodium chloride flush  3 mL Intravenous Q12H  . sucralfate  1 g Oral Q6H   Continuous Infusions: . pantoprozole (PROTONIX) infusion 8 mg/hr (04/25/19 2111)   PRN Meds:.acetaminophen **OR** acetaminophen, camphor-menthol **AND** hydrOXYzine, ipratropium-albuterol, lidocaine-prilocaine, ondansetron **OR** ondansetron (ZOFRAN) IV, polyvinyl alcohol  Current Labs: reviewed    Physical Exam:  Blood pressure (!) 159/67, pulse 68, temperature 97.6 F (36.4 C), temperature source Oral, resp. rate 18, height 5\' 2"  (1.575 m), weight 40.2 kg, SpO2 100 %.  Gen - NAD, chronically ill appearing female in bed in NAD  HEENT NCAT Neck supple trachea midline  Heart S1S2 no rub Lungs - clear to auscultation and unlabored  Neuro alert and oriented x 3; provides hx and follows commands  Psych normal mood and affect  Ext No edema Access: R IJ TDC c/d/i, no  erythema or drainage from exit site; AVF with bruit and thrill some bruising over site     Hemodialysis prescription: TTS, [facility], blood flow rate 300, AF 1.5, 2K/2.0 calcium, 4 hours, RIJ TDC, EDW 40.5 kg, no heparin, Mircera 150 mcg every 2 weeks (last dose 3/23), Venofer 50 mg once a week, calcitriol 1 mcg q. hemodialysis,   1. ESRD TTS  [facility]  HD per TTS schedule.  No heparin with HD and avoid hypotension with HD. 2. Duodenal ulcer, GI and CCS following. Would transition off of carafate with ESRD. Note GI concern for ischemic etiology of ulcer and vascular consulted with plans for abdominal aortogram mesenteric angiogram with possible intervention on 3/31 3. HTN: follow on current regimen  4. Anemia of CKD: Hb stable; no indication for ESA  5. CKD-BMD: hyperphosphatemia. secondary hyperpara.  Started back calcitriol.    6. Substance abuse - note cocaine positive 03/25/19    Recent Labs  Lab 04/23/19 1245 04/24/19 1810 04/25/19 0328 04/26/19 0255  NA 136  --  139 136  K 4.3  --  3.5 3.6  CL 99  --  99 100  CO2 22  --  26 19*  GLUCOSE 117*  --  76 74  BUN 31*  --  10 14  CREATININE 5.24*  --  3.04* 4.68*  CALCIUM 8.8*  --  8.4* 8.5*  PHOS  --  6.3*  --  5.9*   Recent  Labs  Lab 04/24/19 0010 04/24/19 0010 04/24/19 1810 04/25/19 0328 04/26/19 0255  WBC 7.7  --   --  5.3 5.2  NEUTROABS  --   --   --  3.3 2.7  HGB 11.0*   < > 12.4 12.1 12.0  HCT 36.6   < > 41.2 39.5 39.1  MCV 92.2  --   --  90.0 90.7  PLT 283  --   --  255 268   < > = values in this interval not displayed.     Claudia Desanctis 04/26/2019 10:28 AM

## 2019-04-26 NOTE — Final Consult Note (Signed)
Consultant Final Sign-Off Note    Assessment/Final recommendations  Christina [name] is a 69 y.o. female followed by me for Duodenal ulcers  - EGD 2/27 identified multiple duodenal ulcers to the duodenal bulb, largest measured 15 mm and was deeply cratered, too large for successful endoscopic intervention - CT 3/27 showed an 18 mm ulcer protruding posteriorly distal to the duodenal bulb with surrounding fluid concerning for impending perforation  Hgb stable. WBC wnl. VSS without fever, tachycardia or hypotension. Exam unchanged compared to note yesterday. No indication for emergent surgery. Vascular planning abdominal aortogram mesenteric angiogram possible intervention on Wednesday. Thank you for allowing [place] to participate in the care of your patient!  Please consult [place] again if you have further needs for your patient.  Wound care (if applicable):    Diet at discharge: per primary team   Activity at discharge: per primary team   Follow-up appointment:  PRN   Pending results:  Unresulted Labs (From admission, onward)    Start     Ordered   Signed and Held  Renal function panel  Once,   R     Signed and Held   Signed and Held  CBC  Once,   R     Signed and Held           Medication recommendations: Agree with PPI and Carafate per GI   Other recommendations:    Thank you for allowing [place] to participate in the care of your patient!  Please consult [place] again if you have further needs for your patient.  Barth Kirks [facility] 04/26/2019 9:37 AM    Subjective   Pain unchanged in epigastrium from yesterday to today. No n/v. States she did not drink any clears yesterday or this morning.   Objective  Vital signs in last 24 hours: Temp:  [97.6 F (36.4 C)-98.3 F (36.8 C)] 97.6 F (36.4 C) (03/30 0917) Pulse Rate:  [60-68] 68 (03/30 0917) Resp:  [14-18] 18 (03/30 0917) BP: (115-166)/(67-71) 159/67 (03/30 0917) SpO2:  [100 %] 100 % (03/30 0917) Weight:  [40.2 kg] 40.2 kg  (03/30 0537)  Gen:  Alert, NAD, pleasant Pulm: rate and effort normal Abd: Soft, ND, +BS, no HSM, mild subjective epigastric TTP Skin: no rashes noted, warm and dry  Pertinent labs and Studies: Recent Labs    04/24/19 0010 04/24/19 0010 04/24/19 1810 04/25/19 0328 04/26/19 0255  WBC 7.7  --   --  5.3 5.2  HGB 11.0*   < > 12.4 12.1 12.0  HCT 36.6   < > 41.2 39.5 39.1   < > = values in this interval not displayed.   BMET Recent Labs    04/25/19 0328 04/26/19 0255  NA 139 136  K 3.5 3.6  CL 99 100  CO2 26 19*  GLUCOSE 76 74  BUN 10 14  CREATININE 3.04* 4.68*  CALCIUM 8.4* 8.5*   No results for input(s): LABURIN in the last 72 hours. Results for orders placed or performed during the hospital encounter of 04/23/19  Respiratory Panel by RT PCR (Flu A&B, Covid) - Nasopharyngeal Swab     Status: None   Collection Time: 04/23/19  5:55 PM   Specimen: Nasopharyngeal Swab  Result Value Ref Range Status   SARS Coronavirus 2 by RT PCR NEGATIVE NEGATIVE Final    Comment: (NOTE) SARS-CoV-2 target nucleic acids are NOT DETECTED. The SARS-CoV-2 RNA is generally detectable in upper respiratoy specimens during the acute phase of infection. The lowest concentration  of SARS-CoV-2 viral copies this assay can detect is 131 copies/mL. A negative result does not preclude SARS-Cov-2 infection and should not be used as the sole basis for treatment or other patient management decisions. A negative result may occur with  improper specimen collection/handling, submission of specimen other than nasopharyngeal swab, presence of viral mutation(s) within the areas targeted by this assay, and inadequate number of viral copies (<131 copies/mL). A negative result must be combined with clinical observations, patient history, and epidemiological information. The expected result is Negative. Fact Sheet for Patients:  PinkCheek.be Fact Sheet for Healthcare Providers:   GravelBags.it This test is not yet ap proved or cleared by the [place] FDA and  has been authorized for detection and/or diagnosis of SARS-CoV-2 by FDA under an Emergency Use Authorization (EUA). This EUA will remain  in effect (meaning this test can be used) for the duration of the COVID-19 declaration under Section 564(b)(1) of the Act, 21 U.S.C. section 360bbb-3(b)(1), unless the authorization is terminated or revoked sooner.    Influenza A by PCR NEGATIVE NEGATIVE Final   Influenza B by PCR NEGATIVE NEGATIVE Final    Comment: (NOTE) The Xpert Xpress SARS-CoV-2/FLU/RSV assay is intended as an aid in  the diagnosis of influenza from Nasopharyngeal swab specimens and  should not be used as a sole basis for treatment. Nasal washings and  aspirates are unacceptable for Xpert Xpress SARS-CoV-2/FLU/RSV  testing. Fact Sheet for Patients: PinkCheek.be Fact Sheet for Healthcare Providers: GravelBags.it This test is not yet approved or cleared by the [place] FDA and  has been authorized for detection and/or diagnosis of SARS-CoV-2 by  FDA under an Emergency Use Authorization (EUA). This EUA will remain  in effect (meaning this test can be used) for the duration of the  Covid-19 declaration under Section 564(b)(1) of the Act, 21  U.S.C. section 360bbb-3(b)(1), unless the authorization is  terminated or revoked. Performed at [facility], [place] [address] [place], [place] [address]     Imaging: VAS [place] ABI WITH/WO TBI  Result Date: 04/25/2019 LOWER EXTREMITY DOPPLER STUDY Indications: Claudication. High Risk         Hypertension, hyperlipidemia, past history of smoking, prior Factors:          MI.  Comparison Study: No prior exam. Performing Technologist: Baldwin Crown RVT, RDMS  Examination Guidelines: A complete evaluation includes at minimum, Doppler waveform signals and systolic  blood pressure reading at the level of bilateral brachial, anterior tibial, and posterior tibial arteries, when vessel segments are accessible. Bilateral testing is considered an integral part of a complete examination. Photoelectric Plethysmograph (PPG) waveforms and toe systolic pressure readings are included as required and additional duplex testing as needed. Limited examinations for reoccurring indications may be performed as noted.  ABI Findings: +---------+------------------+-----+-------------------+--------+ Right    Rt Pressure (mmHg)IndexWaveform           Comment  +---------+------------------+-----+-------------------+--------+ Brachial 122                    triphasic                   +---------+------------------+-----+-------------------+--------+ PTA      104               0.85 dampened monophasic         +---------+------------------+-----+-------------------+--------+ DP       114               0.93 dampened monophasic         +---------+------------------+-----+-------------------+--------+  Great Toe43                0.35 Abnormal                    +---------+------------------+-----+-------------------+--------+ +---------+------------------+-----+-------------------+-------------+ Left     Lt Pressure (mmHg)IndexWaveform           Comment       +---------+------------------+-----+-------------------+-------------+ Brachial                                           UTO - fistula +---------+------------------+-----+-------------------+-------------+ PTA      130               1.07 monophasic                       +---------+------------------+-----+-------------------+-------------+ DP       92                0.75 dampened monophasic              +---------+------------------+-----+-------------------+-------------+ Great Toe59                0.48 Abnormal                          +---------+------------------+-----+-------------------+-------------+ +-------+-----------+-----------+------------+------------+ ABI/TBIToday's ABIToday's TBIPrevious ABIPrevious TBI +-------+-----------+-----------+------------+------------+ Right  0.93       0.35                                +-------+-----------+-----------+------------+------------+ Left   1.07       0.48                                +-------+-----------+-----------+------------+------------+  Summary: Right: Resting right ankle-brachial index indicates mild right lower extremity arterial disease. The right toe-brachial index is abnormal. Left: Resting left ankle-brachial index is within normal range. No evidence of significant left lower extremity arterial disease. The left toe-brachial index is abnormal.  *See table(s) above for measurements and observations.  Electronically signed by Ruta Hinds MD on 04/25/2019 at 8:29:38 PM.    Final

## 2019-04-26 NOTE — Progress Notes (Signed)
Normal ABI so will defer lower agram for now Mesenteric Angio and possible intervention by Dr Carlis Abbott tomorrow  Ruta Hinds, MD [facility] Office: [phone]

## 2019-04-27 ENCOUNTER — Encounter (HOSPITAL_COMMUNITY)
Admission: EM | Disposition: A | Discharge status: Home / Self Care | Payer: Self-pay | Source: Ambulatory Visit | Attending: Internal Medicine

## 2019-04-27 DIAGNOSIS — K551 Chronic vascular disorders of intestine: Secondary | ICD-10-CM

## 2019-04-27 HISTORY — PX: PERIPHERAL VASCULAR INTERVENTION: CATH118257

## 2019-04-27 HISTORY — PX: VISCERAL ANGIOGRAPHY: CATH118276

## 2019-04-27 LAB — CBC WITH DIFFERENTIAL/PLATELET
Abs Immature Granulocytes: 0.01 10*3/uL (ref 0.00–0.07)
Basophils Absolute: 0 10*3/uL (ref 0.0–0.1)
Basophils Relative: 1 %
Eosinophils Absolute: 0.1 10*3/uL (ref 0.0–0.5)
Eosinophils Relative: 2 %
HCT: 40.2 % (ref 36.0–46.0)
Hemoglobin: 12.3 g/dL (ref 12.0–15.0)
Immature Granulocytes: 0 %
Lymphocytes Relative: 26 %
Lymphs Abs: 1.2 10*3/uL (ref 0.7–4.0)
MCH: 27.2 pg (ref 26.0–34.0)
MCHC: 30.6 g/dL (ref 30.0–36.0)
MCV: 88.9 fL (ref 80.0–100.0)
Monocytes Absolute: 0.6 10*3/uL (ref 0.1–1.0)
Monocytes Relative: 13 %
Neutro Abs: 2.8 10*3/uL (ref 1.7–7.7)
Neutrophils Relative %: 58 %
Platelets: 215 10*3/uL (ref 150–400)
RBC: 4.52 MIL/uL (ref 3.87–5.11)
RDW: 16.4 % — ABNORMAL HIGH (ref 11.5–15.5)
WBC: 4.8 10*3/uL (ref 4.0–10.5)
nRBC: 0 % (ref 0.0–0.2)

## 2019-04-27 SURGERY — VISCERAL ANGIOGRAPHY
Anesthesia: LOCAL

## 2019-04-27 MED ORDER — CLOPIDOGREL BISULFATE 75 MG PO TABS: 300.0000 mg | ORAL_TABLET | Freq: Once | ORAL | Status: DC

## 2019-04-27 MED ORDER — HEPARIN SODIUM (PORCINE) 1000 UNIT/ML IJ SOLN
INTRAMUSCULAR | Status: AC
  Filled 2019-04-27: qty 1

## 2019-04-27 MED ORDER — CLOPIDOGREL BISULFATE 300 MG PO TABS
ORAL_TABLET | ORAL | Status: AC
  Filled 2019-04-27: qty 1

## 2019-04-27 MED ORDER — HYDRALAZINE HCL 20 MG/ML IJ SOLN: 5.0000 mg | INTRAMUSCULAR | Status: DC | PRN

## 2019-04-27 MED ORDER — CHLORHEXIDINE GLUCONATE CLOTH 2 % EX PADS: 6.0000 | MEDICATED_PAD | Freq: Every day | CUTANEOUS | Status: DC

## 2019-04-27 MED ORDER — LABETALOL HCL 5 MG/ML IV SOLN: 10.0000 mg | INTRAVENOUS | Status: DC | PRN

## 2019-04-27 MED ORDER — ONDANSETRON HCL 4 MG/2ML IJ SOLN: 4.0000 mg | Freq: Four times a day (QID) | INTRAMUSCULAR | Status: DC | PRN

## 2019-04-27 MED ORDER — IODIXANOL 320 MG/ML IV SOLN
INTRAVENOUS | Status: DC | PRN
  Administered 2019-04-27: 115 mL via INTRA_ARTERIAL

## 2019-04-27 MED ORDER — FENTANYL CITRATE (PF) 100 MCG/2ML IJ SOLN
INTRAMUSCULAR | Status: AC
  Filled 2019-04-27: qty 2

## 2019-04-27 MED ORDER — LIDOCAINE HCL (PF) 1 % IJ SOLN
INTRAMUSCULAR | Status: AC
  Filled 2019-04-27: qty 30

## 2019-04-27 MED ORDER — FENTANYL CITRATE (PF) 100 MCG/2ML IJ SOLN
INTRAMUSCULAR | Status: DC | PRN
  Administered 2019-04-27: 25 ug via INTRAVENOUS

## 2019-04-27 MED ORDER — CLOPIDOGREL BISULFATE 300 MG PO TABS
ORAL_TABLET | ORAL | Status: DC | PRN
  Administered 2019-04-27: 300 mg via ORAL

## 2019-04-27 MED ORDER — HEPARIN SODIUM (PORCINE) 1000 UNIT/ML IJ SOLN
INTRAMUSCULAR | Status: DC | PRN
  Administered 2019-04-27: 4000 [IU] via INTRAVENOUS

## 2019-04-27 MED ORDER — ACETAMINOPHEN 325 MG PO TABS: 650.0000 mg | ORAL_TABLET | ORAL | Status: DC | PRN

## 2019-04-27 MED ORDER — SODIUM CHLORIDE 0.9% FLUSH
3.0000 mL | Freq: Two times a day (BID) | INTRAVENOUS | Status: DC
  Administered 2019-04-27 – 2019-04-29 (×4): 3 mL via INTRAVENOUS

## 2019-04-27 MED ORDER — HEPARIN (PORCINE) IN NACL 1000-0.9 UT/500ML-% IV SOLN
INTRAVENOUS | Status: AC
  Filled 2019-04-27: qty 1000

## 2019-04-27 MED ORDER — HEPARIN (PORCINE) IN NACL 1000-0.9 UT/500ML-% IV SOLN
INTRAVENOUS | Status: DC | PRN
  Administered 2019-04-27 (×2): 500 mL

## 2019-04-27 MED ORDER — CLOPIDOGREL BISULFATE 75 MG PO TABS
75.0000 mg | ORAL_TABLET | Freq: Every day | ORAL | Status: DC
  Administered 2019-04-28 – 2019-04-29 (×2): 75 mg via ORAL
  Filled 2019-04-27 (×2): qty 1

## 2019-04-27 MED ORDER — SODIUM CHLORIDE 0.9 % IV SOLN: 250.0000 mL | INTRAVENOUS | Status: DC | PRN

## 2019-04-27 MED ORDER — MIDAZOLAM HCL 2 MG/2ML IJ SOLN
INTRAMUSCULAR | Status: AC
  Filled 2019-04-27: qty 2

## 2019-04-27 MED ORDER — MIDAZOLAM HCL 2 MG/2ML IJ SOLN
INTRAMUSCULAR | Status: DC | PRN
  Administered 2019-04-27: 1 mg via INTRAVENOUS

## 2019-04-27 MED ORDER — SODIUM CHLORIDE 0.9% FLUSH: 3.0000 mL | INTRAVENOUS | Status: DC | PRN

## 2019-04-27 MED ORDER — LIDOCAINE HCL (PF) 1 % IJ SOLN
INTRAMUSCULAR | Status: DC | PRN
  Administered 2019-04-27: 15 mL

## 2019-04-27 SURGICAL SUPPLY — 18 items
BALLN VIATRAC 6X20X135 (BALLOONS) ×3
BALLOON VIATRAC 6X20X135 (BALLOONS) IMPLANT
CATH OMNI FLUSH 5F 65CM (CATHETERS) ×3 IMPLANT
CLOSURE MYNX CONTROL 6F/7F (Vascular Products) ×1 IMPLANT
GUIDE CATH VISTA IMA 6F (CATHETERS) ×3 IMPLANT
KIT MICROPUNCTURE NIT STIFF (SHEATH) ×3 IMPLANT
KIT PV (KITS) ×3 IMPLANT
SHEATH PINNACLE 5F 10CM (SHEATH) ×3 IMPLANT
SHEATH PINNACLE 6F 10CM (SHEATH) ×3 IMPLANT
SHEATH PROBE COVER 6X72 (BAG) ×3 IMPLANT
STENT HERCULINK RX 6.0X18X135 (Permanent Stent) ×1 IMPLANT
STOPCOCK MORSE 400PSI 3WAY (MISCELLANEOUS) ×3 IMPLANT
SYR MEDRAD MARK 7 150ML (SYRINGE) ×3 IMPLANT
TRANSDUCER W/STOPCOCK (MISCELLANEOUS) ×3 IMPLANT
TRAY PV CATH (CUSTOM PROCEDURE TRAY) ×3 IMPLANT
TUBING CIL FLEX 10 FLL-RA (TUBING) ×3 IMPLANT
WIRE BENTSON .035X145CM (WIRE) ×3 IMPLANT
WIRE STABILIZER XS .014X180CM (WIRE) ×3 IMPLANT

## 2019-04-27 NOTE — Progress Notes (Signed)
Daily Rounding Note  04/27/2019, 10:26 AM  LOS: 4 days   SUBJECTIVE:   Chief complaint:     Mesenteric angiography and possible intervention today. Patient feels well.  Has not had abdominal pain or nausea and vomiting for at least a couple of days.  No bowel movements yesterday or today. Currently n.p.o. and hungry, awaiting mesenteric CT angio.  OBJECTIVE:         Vital signs in last 24 hours:    Temp:  [97.6 F (36.4 C)-98.6 F (37 C)] 97.9 F (36.6 C) (03/31 0438) Pulse Rate:  [59-71] 71 (03/31 0438) Resp:  [13-25] 20 (03/31 0438) BP: (126-192)/(53-77) 168/73 (03/31 0438) SpO2:  [98 %-100 %] 100 % (03/31 0438) Weight:  [39.2 kg-40.1 kg] 39.2 kg (03/30 1520) Last BM Date: 04/23/19 Filed Weights   04/26/19 0537 04/26/19 1150 04/26/19 1520  Weight: 40.2 kg 40.1 kg 39.2 kg   General: Comfortable, pleasant, animated.  Looks thin and somewhat chronically ill but comfortable. Heart: RRR. Chest: Clear bilaterally.  Vocal quality raspy consistent with her previous history of heavy smoking Abdomen: Soft, nontender, nondistended.  Active bowel sounds. Extremities: No CCE. Neuro/Psych: Oriented x3.  Animated.  Pleasant, cooperative.  Intake/Output from previous day: 03/30 0701 - 03/31 0700 In: -  Out: 1000   Intake/Output this shift: No intake/output data recorded.  Lab Results: Recent Labs    04/25/19 0328 04/26/19 0255 04/27/19 0314  WBC 5.3 5.2 4.8  HGB 12.1 12.0 12.3  HCT 39.5 39.1 40.2  PLT 255 268 215   BMET Recent Labs    04/25/19 0328 04/26/19 0255  NA 139 136  K 3.5 3.6  CL 99 100  CO2 26 19*  GLUCOSE 76 74  BUN 10 14  CREATININE 3.04* 4.68*  CALCIUM 8.4* 8.5*   LFT Recent Labs    04/26/19 0255  ALBUMIN 3.2*   PT/INR No results for input(s): LABPROT, INR in the last 72 hours. Hepatitis Panel Recent Labs    04/26/19 1217  HEPBSAG NON REACTIVE    Studies/Results: VAS [place] ABI  WITH/WO TBI  Result Date: 04/25/2019 LOWER EXTREMITY DOPPLER STUDY.  Summary: Right: Resting right ankle-brachial index indicates mild right lower extremity arterial disease. The right toe-brachial index is abnormal. Left: Resting left ankle-brachial index is within normal range. No evidence of significant left lower extremity arterial disease. The left toe-brachial index is abnormal.  *See table(s) above for measurements and observations.  Electronically signed by Ruta Hinds MD on 04/25/2019 at 8:29:38 PM.    Final    Scheduled Meds: . amLODipine  10 mg Oral QHS  . calcitRIOL  1 mcg Oral Q T,Th,Sa-HD  . Chlorhexidine Gluconate Cloth  6 each Topical Q0600  . Chlorhexidine Gluconate Cloth  6 each Topical Q0600  . metoprolol tartrate  100 mg Oral BID  . mometasone-formoterol  2 puff Inhalation BID  . pantoprazole  40 mg Intravenous Q12H  . polyethylene glycol  17 g Oral Daily  . sodium chloride flush  3 mL Intravenous Q12H  . sucralfate  1 g Oral Q6H   Continuous Infusions: PRN Meds:.acetaminophen **OR** acetaminophen, camphor-menthol **AND** hydrOXYzine, heparin, ipratropium-albuterol, lidocaine-prilocaine, ondansetron **OR** ondansetron (ZOFRAN) IV, polyvinyl alcohol  ASSESMENT:    *   CGE, upper abdominal pain despite doses of Protonix 40 po bid, Carafate qid.   04/23/19 EGD with multiple duodenal ulcers, largest 15 mm and deeply cratered was too large for endoscopic intervention.  CTA 2/27  concerning for ischemia.  PAP 3/27 showing 18 mm ulcer distal to duodenal bulb concerning for impending perforation. On bid IV PPI and carafate q 6 h.  sxs resolved.    *    Anemia requiring transfusion late February/early March 2021, resolved with stable Hb despite the limited hematemesis. Mircera q 2 weeks, venofer weekly per Nephrology.    *   ESRD.  On HD TTS.     *   Hx substance abuse, cocaine + on 03/25/19.      PLAN   *   Mesenteric angiogram today "after 2 PM" per pt report.      Resume diet of soft/ renal after procedure.   Stopping Carafate per wishes of nephrologist Dr Royce Macadamia.       Christina [name]  04/27/2019, 10:26 AM Phone [phone]

## 2019-04-27 NOTE — Care Management Important Message (Signed)
Important Message  Patient Details  Name: Christina [name] MRN: [id number] Date of Birth: 03/07/1950   Medicare Important Message Given:  Yes     Shelda Altes 04/27/2019, 10:38 AM

## 2019-04-27 NOTE — Progress Notes (Signed)
PROGRESS NOTE    Christina [name]  GGE:[id number] DOB: [birth year] DOA: 04/23/2019 PCP: Maggie Schwalbe, PA-C   Brief Narrative:  Patient is a 69 year old female with history of SBO, hypertension, hyperlipidemia, depression, coronary disease with stents, COPD, ESRD on TTS schedule dialysis, paroxysmal A. fib who presents with complaints of abdominal pain.  She was admitted last time on 2/26 and was discharged on 3/3.  In that hospitalization, she underwent EGD which showed gastritis, esophagitis, multiple duodenal ulcers.  She was started on PPI and Carafate and she was taking them.CT abdomen on this admission showed duodenal  ulcer, close to perforation.  GI, general surgery consulted.  Started on Protonix drip.  Vascular surgery also consulted  for finding of possible ischemic etiology for her ulceration. Undergoing mesenteric angiogram today.  Assessment & Plan:   Principal Problem:   Duodenal ulcer perforation (HCC) Active Problems:   ESRD (end stage renal disease) ([place])   Essential hypertension   Hyperlipidemia   Abdominal pain   AF (paroxysmal atrial fibrillation) (HCC)   Cocaine abuse ([place])   Superior mesenteric artery atherosclerosis (HCC)    Duodenal Ulcer: CT Findings consistent with an 18 mm ulcer protruding posteriorly just distal to the duodenal bulb. Possibility of  impending perforation. She has been admitted multiple times for abdominal pain, GI bleed requiring transfusion in the last 2 months.  EGD on 2/27 performed by Dr. Bryan Lemma showed grade B esophagitis without bleeding, 2 cm hiatal hernia, mild gastritis, multiple ulcers in the duodenal bulb. The largest ulcer was 15 mm and deeply cratered; it was too large for endoscopic intervention and was recommended for IR or surgical consultation if bleeding recurred.  These findings were concerning for ischemic etiology. CTA done on 03/19/2019 showed high-grade stenosis, stenosis of the SMA,complete occlusion of celiac  axis, inferior mesenteric arteries.  Biopsies of ulcer didnt show malignancy. Patient on twice daily PPI and Carafate and she is compliant with that.  General surgery, GI consulted. Continue Protonix IV BID for now. Vascular surgery planning for abdominal aortogram, mesenteric angiogram with possible intervention on 04/27/19.  A. fib with RVR: Presented with A. fib with RVR.  History of proximal A. fib.  Currently heart rate is well controlled and is in sinus.  Poor candidate for anticoagulation due to large persistent duodenal ulcer.  On metoprolol for rate control.  ESRD on dialysis: Dialyzed on TTS schedule.  Nephrology following.   Coronary artery disease: Denies any chest pain at present.  Continue current management  Hypertension:Monitor BP.  Continue her home meds  History of COPD: Not on exacerbation at present.  Continue Combivent, Symbicort  Hyperlipidemia: On Pravachol at home.  Cocaine abuse: UDS positive for cocaine on 2/26.  Counseled for cessation          DVT prophylaxis:SCD Code Status: Full Family Communication:   Patient communicating with her family.No need to call anybody as per patient Disposition Plan: Patient is from home.   Expected discharge plan to home after full work-up.   Consultants: GI,general surgery,Nephrology  Procedures:None  Antimicrobials:  Anti-infectives (From admission, onward)   None      Subjective: Patient seen and examined at the bedside this morning.  Hemodynamically stable.  Complains of feeling cold otherwise denies any abdominal pain, nausea or vomiting.  Waiting for procedure today from vascular surgery.  Objective: Vitals:   04/26/19 1520 04/26/19 1552 04/26/19 1955 04/27/19 0438  BP: (!) 157/70 (!) 187/70 (!) 151/64 (!) 168/73  Pulse: 67 62 66 71  Resp: 17 20 19 20   Temp: 98.6 F (37 C) 97.7 F (36.5 C) 97.8 F (36.6 C) 97.9 F (36.6 C)  TempSrc: Oral Oral Oral Oral  SpO2: 98% 98% 100% 100%  Weight: 39.2 kg       Height:        Intake/Output Summary (Last 24 hours) at 04/27/2019 0748 Last data filed at 04/26/2019 1520 Gross per 24 hour  Intake --  Output 1000 ml  Net -1000 ml   Filed Weights   04/26/19 0537 04/26/19 1150 04/26/19 1520  Weight: 40.2 kg 40.1 kg 39.2 kg    Examination:    General exam: Comfortable Respiratory system: Bilateral equal air entry, normal vesicular breath sounds, no wheezes or crackles  Cardiovascular system: S1 & S2 heard, RRR. No JVD, murmurs, rubs, gallops or clicks.  Dialysis catheter on the right chest Gastrointestinal system: Abdomen is nondistended, soft and nontender. No organomegaly or masses felt. Normal bowel sounds heard. Central nervous system: Alert and oriented. No focal neurological deficits. Extremities: No edema, no clubbing ,no cyanosis Skin: No rashes, lesions or ulcers,no icterus ,no pallor   Data Reviewed: I have personally reviewed following labs and imaging studies  CBC: Recent Labs  Lab 04/23/19 1245 04/23/19 1245 04/24/19 0010 04/24/19 1810 04/25/19 0328 04/26/19 0255 04/27/19 0314  WBC 9.0  --  7.7  --  5.3 5.2 4.8  NEUTROABS  --   --   --   --  3.3 2.7 2.8  HGB 13.2   < > 11.0* 12.4 12.1 12.0 12.3  HCT 43.9   < > 36.6 41.2 39.5 39.1 40.2  MCV 93.0  --  92.2  --  90.0 90.7 88.9  PLT 265  --  283  --  255 268 215   < > = values in this interval not displayed.   Basic Metabolic Panel: Recent Labs  Lab 04/23/19 1245 04/24/19 1810 04/25/19 0328 04/26/19 0255  NA 136  --  139 136  K 4.3  --  3.5 3.6  CL 99  --  99 100  CO2 22  --  26 19*  GLUCOSE 117*  --  76 74  BUN 31*  --  10 14  CREATININE 5.24*  --  3.04* 4.68*  CALCIUM 8.8*  --  8.4* 8.5*  PHOS  --  6.3*  --  5.9*   GFR: Estimated Creatinine Clearance: 7.1 mL/min (A) (by C-G formula based on SCr of 4.68 mg/dL (H)). Liver Function Tests: Recent Labs  Lab 04/23/19 1245 04/26/19 0255  AST 19  --   ALT 12  --   ALKPHOS 69  --   BILITOT 0.5  --   PROT  6.5  --   ALBUMIN 3.0* 3.2*   Recent Labs  Lab 04/23/19 1245  LIPASE 76*   No results for input(s): AMMONIA in the last 168 hours. Coagulation Profile: No results for input(s): INR, PROTIME in the last 168 hours. Cardiac Enzymes: No results for input(s): CKTOTAL, CKMB, CKMBINDEX, TROPONINI in the last 168 hours. BNP (last 3 results) No results for input(s): PROBNP in the last 8760 hours. HbA1C: No results for input(s): HGBA1C in the last 72 hours. CBG: Recent Labs  Lab 04/23/19 1232  GLUCAP 111*   Lipid Profile: No results for input(s): CHOL, HDL, LDLCALC, TRIG, CHOLHDL, LDLDIRECT in the last 72 hours. Thyroid Function Tests: No results for input(s): TSH, T4TOTAL, FREET4, T3FREE, THYROIDAB in the last 72 hours. Anemia Panel: No results for input(s): VITAMINB12,  FOLATE, FERRITIN, TIBC, IRON, RETICCTPCT in the last 72 hours. Sepsis Labs: No results for input(s): PROCALCITON, LATICACIDVEN in the last 168 hours.  Recent Results (from the past 240 hour(s))  Respiratory Panel by RT PCR (Flu A&B, Covid) - Nasopharyngeal Swab     Status: None   Collection Time: 04/23/19  5:55 PM   Specimen: Nasopharyngeal Swab  Result Value Ref Range Status   SARS Coronavirus 2 by RT PCR NEGATIVE NEGATIVE Final    Comment: (NOTE) SARS-CoV-2 target nucleic acids are NOT DETECTED. The SARS-CoV-2 RNA is generally detectable in upper respiratoy specimens during the acute phase of infection. The lowest concentration of SARS-CoV-2 viral copies this assay can detect is 131 copies/mL. A negative result does not preclude SARS-Cov-2 infection and should not be used as the sole basis for treatment or other patient management decisions. A negative result may occur with  improper specimen collection/handling, submission of specimen other than nasopharyngeal swab, presence of viral mutation(s) within the areas targeted by this assay, and inadequate number of viral copies (<131 copies/mL). A negative result  must be combined with clinical observations, patient history, and epidemiological information. The expected result is Negative. Fact Sheet for Patients:  PinkCheek.be Fact Sheet for Healthcare Providers:  GravelBags.it This test is not yet ap proved or cleared by the [place] FDA and  has been authorized for detection and/or diagnosis of SARS-CoV-2 by FDA under an Emergency Use Authorization (EUA). This EUA will remain  in effect (meaning this test can be used) for the duration of the COVID-19 declaration under Section 564(b)(1) of the Act, 21 U.S.C. section 360bbb-3(b)(1), unless the authorization is terminated or revoked sooner.    Influenza A by PCR NEGATIVE NEGATIVE Final   Influenza B by PCR NEGATIVE NEGATIVE Final    Comment: (NOTE) The Xpert Xpress SARS-CoV-2/FLU/RSV assay is intended as an aid in  the diagnosis of influenza from Nasopharyngeal swab specimens and  should not be used as a sole basis for treatment. Nasal washings and  aspirates are unacceptable for Xpert Xpress SARS-CoV-2/FLU/RSV  testing. Fact Sheet for Patients: PinkCheek.be Fact Sheet for Healthcare Providers: GravelBags.it This test is not yet approved or cleared by the [place] FDA and  has been authorized for detection and/or diagnosis of SARS-CoV-2 by  FDA under an Emergency Use Authorization (EUA). This EUA will remain  in effect (meaning this test can be used) for the duration of the  Covid-19 declaration under Section 564(b)(1) of the Act, 21  U.S.C. section 360bbb-3(b)(1), unless the authorization is  terminated or revoked. Performed at [facility], [address] [place], [place] [address]          Radiology Studies: VAS [place] ABI WITH/WO TBI  Result Date: 04/25/2019 LOWER EXTREMITY DOPPLER STUDY Indications: Claudication. High Risk         Hypertension,  hyperlipidemia, past history of smoking, prior Factors:          MI.  Comparison Study: No prior exam. Performing Technologist: Baldwin Crown RVT, RDMS  Examination Guidelines: A complete evaluation includes at minimum, Doppler waveform signals and systolic blood pressure reading at the level of bilateral brachial, anterior tibial, and posterior tibial arteries, when vessel segments are accessible. Bilateral testing is considered an integral part of a complete examination. Photoelectric Plethysmograph (PPG) waveforms and toe systolic pressure readings are included as required and additional duplex testing as needed. Limited examinations for reoccurring indications may be performed as noted.  ABI Findings: +---------+------------------+-----+-------------------+--------+ Right  Rt Pressure (mmHg)IndexWaveform           Comment  +---------+------------------+-----+-------------------+--------+ Brachial 122                    triphasic                   +---------+------------------+-----+-------------------+--------+ PTA      104               0.85 dampened monophasic         +---------+------------------+-----+-------------------+--------+ DP       114               0.93 dampened monophasic         +---------+------------------+-----+-------------------+--------+ Great Toe43                0.35 Abnormal                    +---------+------------------+-----+-------------------+--------+ +---------+------------------+-----+-------------------+-------------+ Left     Lt Pressure (mmHg)IndexWaveform           Comment       +---------+------------------+-----+-------------------+-------------+ Brachial                                           UTO - fistula +---------+------------------+-----+-------------------+-------------+ PTA      130               1.07 monophasic                       +---------+------------------+-----+-------------------+-------------+ DP        92                0.75 dampened monophasic              +---------+------------------+-----+-------------------+-------------+ Great Toe59                0.48 Abnormal                         +---------+------------------+-----+-------------------+-------------+ +-------+-----------+-----------+------------+------------+ ABI/TBIToday's ABIToday's TBIPrevious ABIPrevious TBI +-------+-----------+-----------+------------+------------+ Right  0.93       0.35                                +-------+-----------+-----------+------------+------------+ Left   1.07       0.48                                +-------+-----------+-----------+------------+------------+  Summary: Right: Resting right ankle-brachial index indicates mild right lower extremity arterial disease. The right toe-brachial index is abnormal. Left: Resting left ankle-brachial index is within normal range. No evidence of significant left lower extremity arterial disease. The left toe-brachial index is abnormal.  *See table(s) above for measurements and observations.  Electronically signed by Ruta Hinds MD on 04/25/2019 at 8:29:38 PM.    Final         Scheduled Meds: . amLODipine  10 mg Oral QHS  . calcitRIOL  1 mcg Oral Q T,Th,Sa-HD  . Chlorhexidine Gluconate Cloth  6 each Topical Q0600  . Chlorhexidine Gluconate Cloth  6 each Topical Q0600  . metoprolol tartrate  100 mg Oral BID  . mometasone-formoterol  2 puff Inhalation BID  . pantoprazole  40 mg Intravenous Q12H  .  polyethylene glycol  17 g Oral Daily  . sodium chloride flush  3 mL Intravenous Q12H  . sucralfate  1 g Oral Q6H   Continuous Infusions:    LOS: 4 days    Time spent: 35 mins.More than 50% of that time was spent in counseling and/or coordination of care.      Shelly Coss, MD [facility] P3/31/2021, 7:48 AM

## 2019-04-27 NOTE — Progress Notes (Addendum)
Arrived from Cath Lab.  S/p R groin access w/ minx closure.  Vital signs taken.   Flat until 710pm.  Patient educated multiple times.

## 2019-04-27 NOTE — Progress Notes (Signed)
Nephrology Progress Note:   45F ESRD THS at [facility] with epigastric pain and imaging concerning for large high risk duodenal ulcer  Subjective:  She had HD on 3/30 with 1 kg UF.  States that she did have some cramping with HD.  Parameters were to not drop systolic below 563. States used tunneled catheter.   Review of systems  Denies any current n/v Denies shortness of breath or chest pain No current abd pain  03/30 0701 - 03/31 0700 In: -  Out: 1000   Filed Weights   04/26/19 0537 04/26/19 1150 04/26/19 1520  Weight: 40.2 kg 40.1 kg 39.2 kg    Scheduled Meds: . amLODipine  10 mg Oral QHS  . calcitRIOL  1 mcg Oral Q T,Th,Sa-HD  . Chlorhexidine Gluconate Cloth  6 each Topical Q0600  . Chlorhexidine Gluconate Cloth  6 each Topical Q0600  . metoprolol tartrate  100 mg Oral BID  . mometasone-formoterol  2 puff Inhalation BID  . pantoprazole  40 mg Intravenous Q12H  . polyethylene glycol  17 g Oral Daily  . sodium chloride flush  3 mL Intravenous Q12H  . sucralfate  1 g Oral Q6H   Continuous Infusions:  PRN Meds:.acetaminophen **OR** acetaminophen, camphor-menthol **AND** hydrOXYzine, heparin, ipratropium-albuterol, lidocaine-prilocaine, ondansetron **OR** ondansetron (ZOFRAN) IV, polyvinyl alcohol  Current Labs: reviewed    Physical Exam:  Blood pressure (!) 168/73, pulse 71, temperature 97.9 F (36.6 C), temperature source Oral, resp. rate 20, height 5\' 2"  (1.575 m), weight 39.2 kg, SpO2 100 %.  Gen - NAD, chronically ill appearing female in bed in NAD  HEENT NCAT Neck supple trachea midline  Heart S1S2 no rub Lungs - clear to auscultation and unlabored  Neuro alert and oriented x 3; provides hx and follows commands  Psych normal mood and affect  Ext No edema Access: R IJ TDC c/d/i, no erythema or drainage from exit site; left AVF with bruit and thrill some bruising over site     Hemodialysis prescription: TTS, [facility], blood flow rate 300, AF 1.5, 2K/2.0  calcium, 4 hours, RIJ TDC, EDW 40.5 kg, no heparin, Mircera 150 mcg every 2 weeks (last dose 3/23), Venofer 50 mg once a week, calcitriol 1 mcg q. hemodialysis  1. ESRD TTS [facility]  HD per TTS schedule.  No heparin with HD and avoid hypotension with HD.  2. Duodenal ulcer, GI and CCS following. Would please transition off of sucralafate with ESRD. Note GI concern for ischemic etiology of ulcer and vascular consulted with plans for abdominal aortogram mesenteric angiogram with possible intervention on 3/31 3. HTN: follow on current regimen concern for ischemic ulcer as above 4. Anemia of CKD: Hb stable; no indication for ESA  5. CKD-BMD: hyperphosphatemia. secondary hyperpara.  back on calcitriol.    6. Substance abuse - note cocaine positive 03/25/19    Recent Labs  Lab 04/23/19 1245 04/24/19 1810 04/25/19 0328 04/26/19 0255  NA 136  --  139 136  K 4.3  --  3.5 3.6  CL 99  --  99 100  CO2 22  --  26 19*  GLUCOSE 117*  --  76 74  BUN 31*  --  10 14  CREATININE 5.24*  --  3.04* 4.68*  CALCIUM 8.8*  --  8.4* 8.5*  PHOS  --  6.3*  --  5.9*   Recent Labs  Lab 04/25/19 0328 04/26/19 0255 04/27/19 0314  WBC 5.3 5.2 4.8  NEUTROABS 3.3 2.7 2.8  HGB 12.1 12.0 12.3  HCT 39.5 39.1 40.2  MCV 90.0 90.7 88.9  PLT 255 268 215     Christina [name] 04/27/2019 10:18 AM

## 2019-04-27 NOTE — Op Note (Signed)
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  Ultrasound guided access of the right common femoral artery 2.  Mesenteric arteriogram 3.  SMA angioplasty with stent placement (6 mm x 18 mm Herculink balloon expandable bare-metal stent) 4.  Mynx closure of the right common femoral artery 5.  29 minutes of monitored moderate conscious sedation time  Indications: Patient is a 69 year old female who presented with upper abdominal pain and also found to have a duodenal ulcer.  She has been followed by GI.  She was found to have occlusion of her celiac artery and a suspected high-grade narrowing of her superior mesenteric artery.  She presents today for planned mesenteric arteriogram and possible intervention after risk and benefits are discussed.  Findings:   Mesenteric arteriogram shows an occluded celiac artery at the ostium with reconstitution of distal main branches through collaterals.  The SMA has a high-grade proximal stenosis but it measures 6 mm and much healthier in the mid to distal segment.  Ultimately the SMA was cannulated with an IM guide cath and then we advanced an 014 stabilizer wire.  This was then primarily stented with a balloon expandable bare-metal stent using a 6 mm x 18 mm Herculink.  The ostium of the stent was flared to 7 mm.  She now has widely patent SMA with no residual stenosis and excellent filling distally.   Procedure:  The patient was identified in the holding area and taken to room 7.  The patient was then placed supine on the table and prepped and draped in the usual sterile fashion.  A time out was called.  Ultrasound was used to evaluate the right common femoral artery.  It was patent .  A digital ultrasound image was  acquired.  A micropuncture needle was used to access the right common femoral artery under ultrasound guidance.  An 018 wire was advanced without resistance and a micropuncture sheath was placed.  The 018 wire was removed and a benson wire was placed.  The micropuncture sheath was exchanged for a 6 french sheath.  An omniflush catheter was advanced over the wire to the level of L-1.  Next the C-arm was placed in lateral position and a mesenteric arteriogram was obtained.  Patient was fairly uncooperative and it was difficult image given she was not really following breathing instruction throughout the case.  Ultimately we determined that the celiac was occluded as demonstrated on CTA with filling of the main branches through collaterals.  The SMA had a high-grade stenosis proximally.  Ultimately we then placed a long 6 [place] IM guide cath and with an 014 stabilizer wire cannulated the SMA.  We advanced the guide cath to advance into the ostium of the artery.  We then selected a 6 mm x 18 mm balloon expandable Herculink there was advanced over the stabilizer wire.  We pulled the guide cath back and took some hand injections in order to visualize the anterior wall of the aorta to allow the stent to hang out 1 to 2 mm beyond the ostium.  Ultimately the stent was deployed to nominal pressure.  We then used a 6 mm Viatrak balloon that was overinflated to flare the proximal end of the stent to 7 mm.  We tried to use our IM guide cath to get pictures  and it appeared the stent was widely patent but again she was breathing throughout the case while we were trying to get good images.  We put a Omni Flush catheter back up and got one final mesenteric arteriogram that showed a widely patent SMA with stent in place.  That point time wires and catheters removed a mynx closure was deployed in the right groin.  Plan: Patient will be loaded on Plavix and will need to start Plavix daily as well as aspirin if okay with GI and  primary team.   Marty Heck, MD [facility]: [phone]

## 2019-04-28 LAB — CBC WITH DIFFERENTIAL/PLATELET
Abs Immature Granulocytes: 0.03 10*3/uL (ref 0.00–0.07)
Basophils Absolute: 0 10*3/uL (ref 0.0–0.1)
Basophils Relative: 1 %
Eosinophils Absolute: 0.1 10*3/uL (ref 0.0–0.5)
Eosinophils Relative: 2 %
HCT: 38.5 % (ref 36.0–46.0)
Hemoglobin: 11.8 g/dL — ABNORMAL LOW (ref 12.0–15.0)
Immature Granulocytes: 1 %
Lymphocytes Relative: 19 %
Lymphs Abs: 1 10*3/uL (ref 0.7–4.0)
MCH: 27.3 pg (ref 26.0–34.0)
MCHC: 30.6 g/dL (ref 30.0–36.0)
MCV: 89.1 fL (ref 80.0–100.0)
Monocytes Absolute: 0.5 10*3/uL (ref 0.1–1.0)
Monocytes Relative: 10 %
Neutro Abs: 3.7 10*3/uL (ref 1.7–7.7)
Neutrophils Relative %: 67 %
Platelets: 209 10*3/uL (ref 150–400)
RBC: 4.32 MIL/uL (ref 3.87–5.11)
RDW: 16.2 % — ABNORMAL HIGH (ref 11.5–15.5)
WBC: 5.4 10*3/uL (ref 4.0–10.5)
nRBC: 0 % (ref 0.0–0.2)

## 2019-04-28 LAB — RENAL FUNCTION PANEL
Albumin: 2.8 g/dL — ABNORMAL LOW (ref 3.5–5.0)
Anion gap: 13 (ref 5–15)
BUN: 14 mg/dL (ref 8–23)
CO2: 25 mmol/L (ref 22–32)
Calcium: 8.7 mg/dL — ABNORMAL LOW (ref 8.9–10.3)
Chloride: 97 mmol/L — ABNORMAL LOW (ref 98–111)
Creatinine, Ser: 4.85 mg/dL — ABNORMAL HIGH (ref 0.44–1.00)
GFR calc Af Amer: 10 mL/min — ABNORMAL LOW (ref >60)
GFR calc non Af Amer: 9 mL/min — ABNORMAL LOW (ref >60)
Glucose, Bld: 82 mg/dL (ref 70–99)
Phosphorus: 5.5 mg/dL — ABNORMAL HIGH (ref 2.5–4.6)
Potassium: 4 mmol/L (ref 3.5–5.1)
Sodium: 135 mmol/L (ref 135–145)

## 2019-04-28 MED ORDER — ONDANSETRON HCL 4 MG/2ML IJ SOLN
INTRAMUSCULAR | Status: AC
  Administered 2019-04-28: 4 mg
  Filled 2019-04-28: qty 2

## 2019-04-28 MED ORDER — HEPARIN SODIUM (PORCINE) 1000 UNIT/ML IJ SOLN
INTRAMUSCULAR | Status: AC
  Filled 2019-04-28: qty 4

## 2019-04-28 MED ORDER — ASPIRIN EC 81 MG PO TBEC
81.0000 mg | DELAYED_RELEASE_TABLET | Freq: Every day | ORAL | Status: DC
  Administered 2019-04-29: 10:00:00 81 mg via ORAL
  Filled 2019-04-28: qty 1

## 2019-04-28 MED ORDER — GABAPENTIN 300 MG PO CAPS
300.0000 mg | ORAL_CAPSULE | Freq: Once | ORAL | Status: AC
  Administered 2019-04-28: 06:00:00 300 mg via ORAL
  Filled 2019-04-28: qty 1

## 2019-04-28 MED ORDER — PANTOPRAZOLE SODIUM 40 MG PO TBEC
40.0000 mg | DELAYED_RELEASE_TABLET | Freq: Two times a day (BID) | ORAL | Status: DC
  Administered 2019-04-28 – 2019-04-29 (×2): 40 mg via ORAL
  Filled 2019-04-28 (×2): qty 1

## 2019-04-28 MED ORDER — LORAZEPAM 0.5 MG PO TABS
ORAL_TABLET | ORAL | Status: AC
  Filled 2019-04-28: qty 1

## 2019-04-28 MED ORDER — CALCITRIOL 0.5 MCG PO CAPS
ORAL_CAPSULE | ORAL | Status: AC
  Filled 2019-04-28: qty 2

## 2019-04-28 MED ORDER — LORAZEPAM 0.5 MG PO TABS
0.5000 mg | ORAL_TABLET | Freq: Once | ORAL | Status: AC
  Administered 2019-04-28: 0.5 mg via ORAL

## 2019-04-28 MED ORDER — GABAPENTIN 100 MG PO CAPS
100.0000 mg | ORAL_CAPSULE | Freq: Every day | ORAL | Status: DC
  Administered 2019-04-28 – 2019-04-29 (×2): 100 mg via ORAL
  Filled 2019-04-28 (×2): qty 1

## 2019-04-28 NOTE — Progress Notes (Signed)
Nephrology Progress Note:   47F ESRD THS at [facility] with epigastric pain and imaging concerning for large high risk duodenal ulcer  Subjective:  Seen and examined on HD at 3:30 pm and procedure supervised.  BP 131/68 and HR 66. Parameters to not drop systolic below 696. tunneled catheter.  Patient denies any abdominal pain to me today including on HD.  Per nursing she had nausea earlier and leg discomfort but no abdominal pain.  She had SMA sent with vascular for suspected chronic mesenteric ischemia   Review of systems  Denies any current n/v but got PRN for nausea earlier Denies shortness of breath or chest pain No current abd pain  03/31 0701 - 04/01 0700 In: 480 [P.O.:480] Out: -   Filed Weights   04/26/19 1150 04/26/19 1520 04/28/19 0515  Weight: 40.1 kg 39.2 kg 39 kg    Scheduled Meds: . amLODipine  10 mg Oral QHS  . aspirin EC  81 mg Oral Daily  . calcitRIOL      . calcitRIOL  1 mcg Oral Q T,Th,Sa-HD  . Chlorhexidine Gluconate Cloth  6 each Topical Q0600  . clopidogrel  300 mg Oral Once   Followed by  . clopidogrel  75 mg Oral Q breakfast  . gabapentin  100 mg Oral Daily  . heparin      . LORazepam      . metoprolol tartrate  100 mg Oral BID  . mometasone-formoterol  2 puff Inhalation BID  . pantoprazole  40 mg Oral BID  . polyethylene glycol  17 g Oral Daily  . sodium chloride flush  3 mL Intravenous Q12H  . sodium chloride flush  3 mL Intravenous Q12H   Continuous Infusions: . sodium chloride     PRN Meds:.sodium chloride, acetaminophen **OR** acetaminophen, camphor-menthol **AND** hydrOXYzine, heparin, hydrALAZINE, ipratropium-albuterol, labetalol, lidocaine-prilocaine, ondansetron **OR** ondansetron (ZOFRAN) IV, polyvinyl alcohol, sodium chloride flush  Current Labs: reviewed    Physical Exam:  Blood pressure (!) 144/76, pulse 82, temperature (!) 97.2 F (36.2 C), temperature source Oral, resp. rate 13, height 5\' 2"  (1.575 m), weight 39 kg, SpO2 98 %.  Gen -  NAD, chronically ill appearing female in bed in NAD  HEENT NCAT Neck supple trachea midline  Heart S1S2 no rub Lungs - clear to auscultation and unlabored  Neuro alert and oriented x 3; provides hx and follows commands  Psych normal mood and affect  Ext No edema Access: R IJ TDC c/d/i, no erythema or drainage from exit site; left AVF with bruit and thrill some bruising over site     Hemodialysis prescription: TTS, [facility], blood flow rate 300, AF 1.5, 2K/2.0 calcium, 4 hours, RIJ TDC, EDW 40.5 kg, no heparin, Mircera 150 mcg every 2 weeks (last dose 3/23), Venofer 50 mg once a week, calcitriol 1 mcg q. hemodialysis  1. ESRD TTS  [facility]  HD per TTS schedule.  No heparin with HD and avoid hypotension with HD.  2. Chronic mesenteric ischemia and s/p SMA stent with vascular on 3/31 3. Duodenal ulcer, GI and vascular following.  S/p SMA stent. Would please transition off of sucralafate with ESRD.   4. HTN: improved today.  concern for ischemic ulcer as above. S/p stent.  5. Anemia of CKD: Hb stable; no indication for ESA   6. CKD-BMD: hyperphosphatemia. secondary hyperpara.  back on calcitriol.    7. Substance abuse - note cocaine positive 03/25/19; rec cessation.    Recent Labs  Lab 04/23/19 1245  04/24/19 1810 04/25/19 0328 04/26/19 0255 04/28/19 0307  NA   < >  --  139 136 135  K   < >  --  3.5 3.6 4.0  CL   < >  --  99 100 97*  CO2   < >  --  26 19* 25  GLUCOSE   < >  --  76 74 82  BUN   < >  --  10 14 14   CREATININE   < >  --  3.04* 4.68* 4.85*  CALCIUM   < >  --  8.4* 8.5* 8.7*  PHOS  --  6.3*  --  5.9* 5.5*   < > = values in this interval not displayed.   Recent Labs  Lab 04/26/19 0255 04/27/19 0314 04/28/19 0307  WBC 5.2 4.8 5.4  NEUTROABS 2.7 2.8 3.7  HGB 12.0 12.3 11.8*  HCT 39.1 40.2 38.5  MCV 90.7 88.9 89.1  PLT 268 215 209     Christina [name] 04/28/2019 3:39 PM

## 2019-04-28 NOTE — TOC Initial Note (Signed)
Transition of Care [facility]) - Initial/Assessment Note    Patient Details  Name: Christina [name] MRN: [id number] Date of Birth: 1950-04-04  Transition of Care [facility]) CM/SW Contact:    Vinie Sill, [place] Phone Number: 04/28/2019, 5:15 PM  Clinical Narrative:                  CSW visit with the patient. CSW received call form [facility], patient's sister, Darden Amber [phone] was trying to contact the patient. Patient gave CSW permission to speak to her sister "tell her whatever she wants".  CSW contacted [facility] and provided her with the patient's room number information.Patient's sister reports patient lives home alone but has her another sister,Ameryles that is supportive and assists the patient when needed. She reports patient has daughter,Michelle but her sister,Ameryles is her main provider and would be the person to pick up the patient once medically stable.Patient's sister expressed concerns regarding the patient's mental status/confusion. CSW advised to speak with patient's nurse.   CSW called patient's daughter,Michelle, left voice message to return call.  CSW discussed with patient substance abuse. Patient states  she used when she was "young and dumb" but that is not a problem now. Patient declined any SA resources. Patient was receptive to receiving mental Health resources. CSW will provide information prior to discharge.   Thurmond Butts, MSW, LCSWA Clinical Social Worker      Barriers to Discharge: Continued Medical Work up   Patient Goals and CMS Choice        Expected Discharge Plan and Services   In-house Referral: Clinical Social Work                                            Prior Living Arrangements/Services   Lives with:: Self                   Activities of Daily Living Home Assistive Devices/Equipment: Bedside commode/3-in-1, Cane (specify quad or straight), Blood pressure cuff, Eyeglasses, Dentures  (specify type), Hand-held shower hose, Nebulizer, [place]acher ADL Screening (condition at time of admission) Patient's cognitive ability adequate to safely complete daily activities?: Yes Is the patient deaf or have difficulty hearing?: No Does the patient have difficulty seeing, even when wearing glasses/contacts?: Yes Does the patient have difficulty concentrating, remembering, or making decisions?: No Patient able to express need for assistance with ADLs?: Yes Does the patient have difficulty dressing or bathing?: No Independently performs ADLs?: Yes (appropriate for developmental age) Does the patient have difficulty walking or climbing stairs?: No Weakness of Legs: Both Weakness of Arms/Hands: Both  Permission Sought/Granted                  Emotional Assessment Appearance:: Appears stated age Attitude/Demeanor/Rapport: Engaged, Ambitious Affect (typically observed): Pleasant, Other (comment)(confused) Orientation: : Oriented to Place, Oriented to Self Alcohol / Substance Use: Not Applicable Psych Involvement: No (comment)  Admission diagnosis:  Paroxysmal atrial fibrillation (HCC) [I48.0] Generalized abdominal pain [R10.84] ESRD (end stage renal disease) ([place]) [N18.6] Duodenal ulcer perforation ([place]) [K26.5] Duodenal ulcer [K26.9] Patient Active Problem List   Diagnosis Date Noted  . 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 Duodenal ulcer perforation ([place]) 04/23/2019  . Cocaine abuse ([place]) 04/23/2019  . Acute gastric ulcer without hemorrhage or perforation   . Hematemesis 04/04/2019  . Atrial fibrillation with RVR ([place])   .  Duodenal ulcer   . Gastritis and gastroduodenitis   . Acute blood loss anemia   . Ischemic bowel disease ([place]) 03/25/2019  . RUQ pain   . Anemia due to chronic kidney disease, on chronic dialysis ([place])   . AF (paroxysmal atrial fibrillation) ([place])   . Chest pain 03/19/2019  . Hyperkalemia 02/28/2019  . Uremia 02/27/2019  . Volume  overload 02/21/2019  . Abdominal pain 02/21/2019  . Non-compliance with renal dialysis ([place]) 02/21/2019  . Respiratory failure ([place]) 02/04/2019  . Acute and chr resp failure, unsp w hypoxia or hypercapnia ([place])   . Afib ([place]) 01/25/2019  . SBO (small bowel obstruction) ([place]) 08/10/2018  . Anemia due to chronic blood loss   . Heme positive stool   . Platelet inhibition due to Plavix   . Acute GI bleeding 05/06/2018  . ESRD (end stage renal disease) ([place]) 05/06/2018  . Anxiety 05/06/2018  . Bipolar affective ([place]) 05/06/2018  . CAD (coronary artery disease), native coronary artery 05/06/2018  . 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 CVA (cerebral vascular accident) ([place]) 05/06/2018  . Essential hypertension 05/06/2018  . Hyperlipidemia 05/06/2018   PCP:  Maggie Schwalbe, PA-C Pharmacy:   [facility] [place] [place] - [address] [place] [address] Phone: [phone] Fax: [phone]     Social Determinants of Health ([place]) Interventions    Readmission Risk Interventions Readmission Risk Prevention Plan 03/29/2019 02/07/2019  Transportation Screening Complete Complete  Medication Review ([place]) Complete Complete  PCP or Specialist appointment within 3-5 days of discharge - Complete  HRI or [place] Complete Complete  SW Recovery Care/Counseling Consult Complete Complete  Palliative Care Screening Not Applicable Not [place] Not Applicable Not Applicable  Some recent data might be hidden

## 2019-04-28 NOTE — Progress Notes (Signed)
Patient seen by my partner Dr. Carlis Abbott already this morning.  I saw her as well.  No evidence of pseudoaneurysm in the right groin.  Patient complains of bilateral numbness and tingling in her feet.  Her recent ABIs were reviewed and show reasonable perfusion.  Most likely this is neuropathy.  She has been on Neurontin in the past.  I will start her on low-dose Neurontin today and we could potentially titrate up over time but will have to do this slowly over time with her renal failure to make sure we do not make her too sleepy.  I will start her on Neurontin 100 mg once a day today.  Ruta Hinds, MD [facility] Office: [phone]

## 2019-04-28 NOTE — Progress Notes (Signed)
[facility]  Subjective  - no complaints.  No abdominal pain.   Objective (!) 176/78 71 (!) 97.2 F (36.2 C) (Oral) 19 100%  Intake/Output Summary (Last 24 hours) at 04/28/2019 0735 Last data filed at 04/28/2019 0000 Gross per 24 hour  Intake 480 ml  Output -  Net 480 ml    Right groin c/d/i No abdominal pain  Laboratory Lab Results: Recent Labs    04/27/19 0314 04/28/19 0307  WBC 4.8 5.4  HGB 12.3 11.8*  HCT 40.2 38.5  PLT 215 209   BMET Recent Labs    04/26/19 0255 04/28/19 0307  NA 136 135  K 3.6 4.0  CL 100 97*  CO2 19* 25  GLUCOSE 74 82  BUN 14 14  CREATININE 4.68* 4.85*  CALCIUM 8.5* 8.7*    COAG Lab Results  Component Value Date   INR 1.1 03/25/2019   INR 0.9 01/26/2019   INR 1.0 01/25/2019   No results found for: PTT  Assessment/Planning: POD #1 s/p SMA stent for suspected chronic mesenteric ischemia (in setting of chronically occluded celiac artery).  Needs plavix at a minimum and preferably 81 mg aspirin with dual antiplatelet therapy.  Will arrange follow-up in one month with mesenteric duplex.  Can be discharged from our standpoint when ok from medicine and GI.  Marty Heck 04/28/2019 7:35 AM --

## 2019-04-28 NOTE — Progress Notes (Signed)
Pt arrived from Dialysis. No fluid removed but pt did run 3.5 hrs. Will continue to monitor Jerald Kief, RN

## 2019-04-28 NOTE — Progress Notes (Signed)
PROGRESS NOTE    RONNIKA COLLETT  ZOX:[id number] DOB: [birth year] DOA: 04/23/2019 PCP: Maggie Schwalbe, PA-C   Brief Narrative: 69 year old female with history of SBO, hypertension, hyperlipidemia, depression, coronary disease with stents, COPD, ESRD on TTS schedule dialysis, paroxysmal A. fib who presents with complaints of abdominal pain.  She was admitted last time on 2/26 and was discharged on 3/3.  In that hospitalization, she underwent EGD which showed gastritis, esophagitis, multiple duodenal ulcers.  She was started on PPI and Carafate and she was taking them.CT abdomen on this admission showed duodenal  ulcer, close to perforation.  GI, general surgery consulted.  Started on Protonix drip.  Vascular surgery also consulted  for finding of possible ischemic etiology for her ulceration.  Subjective: She has no new complaints.  Patient tolerating diet. No nausea vomiting fever chills.  Assessment & Plan:  Duodenal Ulcer: CT Findings consistent with an 18 mm ulcer protruding posteriorly just distal to the duodenal bulb. Possibility of impending perforation.She has been admitted multiple times for abdominal pain, GI bleed requiring transfusion in the last 2 months.  EGD on 2/27 performed by Dr. Bryan Lemma showed grade B esophagitis without bleeding, 2 cm hiatal hernia, mild gastritis, multiple ulcers in the duodenal bulb. The largest ulcer was 15 mm and deeply cratered; it was too large for endoscopic intervention and was recommended for IR or surgical consultation if bleeding recurred. Protonix 40 twice daily, follow-up further recommendation from gastroenterology regarding patient's aspirin Plavix.  Patient will follow up with her GI Dr. Lyda Jester at [place] 2-3 weeks.  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  Biopsies of ulcer didnt show malignancy.  Vascular recommends Plavix at minimum and preferably 81 mg aspirin with dual antiplatelet therapy, seen by GI and okay for addition of aspirin as well.  Monitor cbc closely  A. fib with RVR: Presented with A. fib with RVR.  History of proximal A. fib.  Currently heart rate is well controlled and is in sinus.  Poor candidate for anticoagulation due to large persistent duodenal ulcer.  On metoprolol for rate control.  Bilateral numbness and tingling on her feet, seen by [facility]VS suspecting neuropathy and placed on Neurontin 100 mg once a day and uptitrate slowly as outpatient given esrd.  ESRD on dialysis: Dialyzed on TTS schedule.  Nephrology following.   Coronary artery disease: Denies any chest pain at present.  Continue current management  Hypertension:Monitor BP.  Continue her home meds  History of COPD: Not on exacerbation at present.  Continue Combivent, Symbicort  Hyperlipidemia: On Pravachol at home.  Cocaine abuse: UDS positive for cocaine on 2/26.  Counseled for cessation Diet Order            Diet renal with fluid restriction Fluid restriction: 1200 mL Fluid; Room service appropriate? Yes; Fluid consistency: Thin  Diet effective now            Body mass index is 15.71 kg/m.  DVT prophylaxis:SCD Code Status: FULL Family Communication: plan of care discussed with patient at bedside. Disposition Plan: Patient is from:home Anticipated Disposition: home Barriers to discharge or conditions that needs to be met prior to discharge: Patient admitted with abdominal pain found to have SMA chronic mesenteric ischemia underwent angiogram and stent placement, initiating dual antiplatelets in the hospital and monitoring for next  24 hours and if CBC remains stable plan discharge home.  Consultants:GI,general surgery,Nephrology, vas surgery Procedures:see  note Microbiology:see note   Medications: Scheduled Meds:  amLODipine  10 mg Oral QHS   aspirin EC  81 mg Oral Daily   calcitRIOL  1 mcg Oral Q T,Th,Sa-HD   Chlorhexidine Gluconate Cloth  6 each Topical Q0600   clopidogrel  300 mg Oral Once   Followed by   clopidogrel  75 mg Oral Q breakfast   gabapentin  100 mg Oral Daily   metoprolol tartrate  100 mg Oral BID   mometasone-formoterol  2 puff Inhalation BID   pantoprazole  40 mg Oral BID   polyethylene glycol  17 g Oral Daily   sodium chloride flush  3 mL Intravenous Q12H   sodium chloride flush  3 mL Intravenous Q12H   Continuous Infusions:  sodium chloride      Antimicrobials: Anti-infectives (From admission, onward)   None       Objective: Vitals: Today's Vitals   04/28/19 0741 04/28/19 0744 04/28/19 0812 04/28/19 0832  BP: (!) 160/93 (!) 144/76    Pulse: 82     Resp: 16   13  Temp: (!) 97.2 F (36.2 C)     TempSrc: Oral     SpO2: 99%   98%  Weight:      Height:      PainSc:   0-No pain     Intake/Output Summary (Last 24 hours) at 04/28/2019 1209 Last data filed at 04/28/2019 1000 Gross per 24 hour  Intake 850 ml  Output 1 ml  Net 849 ml   Filed Weights   04/26/19 1150 04/26/19 1520 04/28/19 0515  Weight: 40.1 kg 39.2 kg 39 kg   Weight change: -1.136 kg   Intake/Output from previous day: 03/31 0701 - 04/01 0700 In: 480 [P.O.:480] Out: -  Intake/Output this shift: Total I/O In: 370 [P.O.:360; I.V.:10] Out: 1 [Urine:1]  Examination:  General exam: AAOx3 ,NAD, weak appearing.Thin HEENT:Oral mucosa moist, Ear/Nose WNL grossly,dentition normal. Respiratory system: bilaterally clears,no wheezing or crackles,no use of accessory muscle, non tender. Cardiovascular system: S1 & S2 +, regular, No JVD. Gastrointestinal system: Abdomen soft, NT,ND, BS+. Nervous System:Alert, awake, moving extremities and grossly nonfocal Extremities: No edema, distal peripheral pulses palpable.  Skin: No  rashes,no icterus. MSK: Normal muscle bulk,tone, power  Data Reviewed: I have personally reviewed following labs and imaging studies CBC: Recent Labs  Lab 04/24/19 0010 04/24/19 0010 04/24/19 1810 04/25/19 0328 04/26/19 0255 04/27/19 0314 04/28/19 0307  WBC 7.7  --   --  5.3 5.2 4.8 5.4  NEUTROABS  --   --   --  3.3 2.7 2.8 3.7  HGB 11.0*   < > 12.4 12.1 12.0 12.3 11.8*  HCT 36.6   < > 41.2 39.5 39.1 40.2 38.5  MCV 92.2  --   --  90.0 90.7 88.9 89.1  PLT 283  --   --  255 268 215 209   < > = values in this interval not displayed.   Basic Metabolic Panel: Recent Labs  Lab 04/23/19 1245 04/24/19 1810 04/25/19 0328 04/26/19 0255 04/28/19 0307  NA 136  --  139 136 135  K 4.3  --  3.5 3.6 4.0  CL 99  --  99 100 97*  CO2 22  --  26 19* 25  GLUCOSE 117*  --  76 74 82  BUN 31*  --  '10 14 14  ' CREATININE 5.24*  --  3.04* 4.68* 4.85*  CALCIUM 8.8*  --  8.4* 8.5* 8.7*  PHOS  --  6.3*  --  5.9* 5.5*   GFR: Estimated Creatinine Clearance: 6.8 mL/min (A) (by C-G formula based on SCr of 4.85 mg/dL (H)). Liver Function Tests: Recent Labs  Lab 04/23/19 1245 04/26/19 0255 04/28/19 0307  AST 19  --   --   ALT 12  --   --   ALKPHOS 69  --   --   BILITOT 0.5  --   --   PROT 6.5  --   --   ALBUMIN 3.0* 3.2* 2.8*   Recent Labs  Lab 04/23/19 1245  LIPASE 76*   No results for input(s): AMMONIA in the last 168 hours. Coagulation Profile: No results for input(s): INR, PROTIME in the last 168 hours. Cardiac Enzymes: No results for input(s): CKTOTAL, CKMB, CKMBINDEX, TROPONINI in the last 168 hours. BNP (last 3 results) No results for input(s): PROBNP in the last 8760 hours. HbA1C: No results for input(s): HGBA1C in the last 72 hours. CBG: Recent Labs  Lab 04/23/19 1232  GLUCAP 111*   Lipid Profile: No results for input(s): CHOL, HDL, LDLCALC, TRIG, CHOLHDL, LDLDIRECT in the last 72 hours. Thyroid Function Tests: No results for input(s): TSH, T4TOTAL, FREET4, T3FREE,  THYROIDAB in the last 72 hours. Anemia Panel: No results for input(s): VITAMINB12, FOLATE, FERRITIN, TIBC, IRON, RETICCTPCT in the last 72 hours. Sepsis Labs: No results for input(s): PROCALCITON, LATICACIDVEN in the last 168 hours.  Recent Results (from the past 240 hour(s))  Respiratory Panel by RT PCR (Flu A&B, Covid) - Nasopharyngeal Swab     Status: None   Collection Time: 04/23/19  5:55 PM   Specimen: Nasopharyngeal Swab  Result Value Ref Range Status   SARS Coronavirus 2 by RT PCR NEGATIVE NEGATIVE Final    Comment: (NOTE) SARS-CoV-2 target nucleic acids are NOT DETECTED. The SARS-CoV-2 RNA is generally detectable in upper respiratoy specimens during the acute phase of infection. The lowest concentration of SARS-CoV-2 viral copies this assay can detect is 131 copies/mL. A negative result does not preclude SARS-Cov-2 infection and should not be used as the sole basis for treatment or other patient management decisions. A negative result may occur with  improper specimen collection/handling, submission of specimen other than nasopharyngeal swab, presence of viral mutation(s) within the areas targeted by this assay, and inadequate number of viral copies (<131 copies/mL). A negative result must be combined with clinical observations, patient history, and epidemiological information. The expected result is Negative. Fact Sheet for Patients:  PinkCheek.be Fact Sheet for Healthcare Providers:  GravelBags.it This test is not yet ap proved or cleared by the [place] FDA and  has been authorized for detection and/or diagnosis of SARS-CoV-2 by FDA under an Emergency Use Authorization (EUA). This EUA will remain  in effect (meaning this test can be used) for the duration of the COVID-19 declaration under Section 564(b)(1) of the Act, 21 U.S.C. section 360bbb-3(b)(1), unless the authorization is terminated or revoked  sooner.    Influenza A by PCR NEGATIVE NEGATIVE Final   Influenza B by PCR NEGATIVE NEGATIVE Final    Comment: (NOTE) The Xpert Xpress SARS-CoV-2/FLU/RSV assay is intended as an aid in  the diagnosis of influenza from Nasopharyngeal swab specimens and  should not be used as a sole basis for treatment. Nasal washings and  aspirates are unacceptable for Xpert Xpress SARS-CoV-2/FLU/RSV  testing. Fact Sheet for Patients: PinkCheek.be Fact Sheet for Healthcare Providers: GravelBags.it  This test is not yet approved or cleared by the [place] and  has been authorized for detection and/or diagnosis of SARS-CoV-2 by  FDA under an Emergency Use Authorization (EUA). This EUA will remain  in effect (meaning this test can be used) for the duration of the  Covid-19 declaration under Section 564(b)(1) of the Act, 21  U.S.C. section 360bbb-3(b)(1), unless the authorization is  terminated or revoked. 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  Ultrasound guided access of the right common femoral artery 2.  Mesenteric arteriogram 3.  SMA angioplasty with stent placement (6 mm x 18 mm Herculink balloon expandable bare-metal stent) 4.  Mynx closure of the right common femoral artery 5.  29 minutes of monitored moderate conscious sedation time  Indications: Patient is a 69 year old female who presented with upper abdominal pain and also found to have a duodenal ulcer.  She has been followed by GI.  She was found to have  occlusion of her celiac artery and a suspected high-grade narrowing of her superior mesenteric artery.  She presents today for planned mesenteric arteriogram and possible intervention after risk and benefits are discussed.  Findings:  Mesenteric arteriogram shows an occluded celiac artery at the ostium with reconstitution of distal main branches through collaterals.  The SMA has a high-grade proximal stenosis but it measures 6 mm and much healthier in the mid to distal segment.  Ultimately the SMA was cannulated with an IM guide cath and then we advanced an 014 stabilizer wire.  This was then primarily stented with a balloon expandable bare-metal stent using a 6 mm x 18 mm Herculink.  The ostium of the stent was flared to 7 mm.  She now has widely patent SMA with no residual stenosis and excellent filling distally.             Procedure:  The patient was identified in the holding area and taken to room 7.  The patient was then placed supine on the table and prepped and draped in the usual sterile fashion.  A time out was called.  Ultrasound was used to evaluate the right common femoral artery.  It was patent .  A digital ultrasound image was acquired.  A micropuncture needle was used to access the right common femoral artery under ultrasound guidance.  An 018 wire was advanced without resistance and a micropuncture sheath was placed.  The 018 wire was removed and a benson wire was placed.  The micropuncture sheath was exchanged for a 6 french sheath.  An omniflush catheter was advanced over the wire to the level of L-1.  Next the C-arm was placed in lateral position and a mesenteric arteriogram was obtained.  Patient was fairly uncooperative and it was difficult image given she was not really following breathing instruction throughout the case.  Ultimately we determined that the celiac was occluded as demonstrated on CTA with filling of the main branches through collaterals.  The SMA had a high-grade stenosis  proximally.  Ultimately we then placed a long  6 [place] IM guide cath and with an 014 stabilizer wire cannulated the SMA.  We advanced the guide cath to advance into the ostium of the artery.  We then selected a 6 mm x 18 mm balloon expandable Herculink there was advanced over the stabilizer wire.  We pulled the guide cath back and took some hand injections in order to visualize the anterior wall of the aorta to allow the stent to hang out 1 to 2 mm beyond the ostium.  Ultimately the stent was deployed to nominal pressure.  We then used a 6 mm Viatrak balloon that was overinflated to flare the proximal end of the stent to 7 mm.  We tried to use our IM guide cath to get pictures and it appeared the stent was widely patent but again she was breathing throughout the case while we were trying to get good images.  We put a Omni Flush catheter back up and got one final mesenteric arteriogram that showed a widely patent SMA with stent in place.  That point time wires and catheters removed a mynx closure was deployed in the right groin.  Plan: Patient will be loaded on Plavix and will need to start Plavix daily as well as aspirin if okay with GI and primary team.   Marty Heck, MD [facility]: [phone]      LOS: 5 days   Time spent: More than 50% of that time was spent in counseling and/or coordination of care.  Antonieta Pert, MD [facility]  04/28/2019, 12:09 PM

## 2019-04-28 NOTE — Progress Notes (Signed)
Daily Rounding Note  04/28/2019, 11:20 AM  LOS: 5 days   SUBJECTIVE:   Chief complaint: CGE, upper abdominal pain, duodenal ulcers suspected ischemic in origin.    Yesterday, 3/31 under went mesenteric arteriogram with angioplasty, stent placement to SMA. Study showed occluded celiac artery at ostium with reconstitution of distal branches through collaterals.  High-grade SMA stenosis. Started on Plavix after intervention Vascular surgery is wanting to add low-dose aspirin to her Plavix.  Would want her on this for about a month.  Pharmacy called to find out what GI thought of aspirin. Continues to feel well.  Tolerating solid foods, no abdominal pain for at least 3 days.  OBJECTIVE:         Vital signs in last 24 hours:    Temp:  [97.2 F (36.2 C)-98 F (36.7 C)] 97.2 F (36.2 C) (04/01 0741) Pulse Rate:  [61-82] 82 (04/01 0741) Resp:  [11-65] 13 (04/01 0832) BP: (94-176)/(54-114) 144/76 (04/01 0744) SpO2:  [93 %-100 %] 98 % (04/01 0832) Weight:  [39 kg] 39 kg (04/01 0515) Last BM Date: 05/24/19 Filed Weights   04/26/19 1150 04/26/19 1520 04/28/19 0515  Weight: 40.1 kg 39.2 kg 39 kg   General: Comfortable. Heart: RRR Chest: Clear.  No labored breathing Abdomen: Soft, nontender, nondistended Extremities: No CCE. Neuro/Psych: Fully alert and oriented.  Calm, cooperative.  Intake/Output from previous day: 03/31 0701 - 04/01 0700 In: 480 [P.O.:480] Out: -   Intake/Output this shift: Total I/O In: 370 [P.O.:360; I.V.:10] Out: 1 [Urine:1]  Lab Results: Recent Labs    04/26/19 0255 04/27/19 0314 04/28/19 0307  WBC 5.2 4.8 5.4  HGB 12.0 12.3 11.8*  HCT 39.1 40.2 38.5  PLT 268 215 209   BMET Recent Labs    04/26/19 0255 04/28/19 0307  NA 136 135  K 3.6 4.0  CL 100 97*  CO2 19* 25  GLUCOSE 74 82  BUN 14 14  CREATININE 4.68* 4.85*  CALCIUM 8.5* 8.7*   LFT Recent Labs    04/26/19 0255  04/28/19 0307  ALBUMIN 3.2* 2.8*   PT/INR No results for input(s): LABPROT, INR in the last 72 hours. 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  Ultrasound guided access of the right common femoral artery 2.  Mesenteric arteriogram 3.  SMA angioplasty with stent placement (6 mm x 18 mm Herculink balloon expandable bare-metal stent) 4.  Mynx closure of the right common femoral artery 5.  29 minutes of monitored moderate conscious sedation time  Indications: Patient is a 69 year old female who presented with upper abdominal pain and also found to have a duodenal ulcer.  She has been followed by GI.  She was found to have occlusion of her celiac artery and a suspected high-grade narrowing of her superior mesenteric artery.  She presents today for planned mesenteric arteriogram and possible intervention after risk and benefits are discussed.  Findings:  Mesenteric arteriogram shows an occluded celiac artery at the ostium with reconstitution of distal main branches through collaterals.  The  SMA has a high-grade proximal stenosis but it measures 6 mm and much healthier in the mid to distal segment.  Ultimately the SMA was cannulated with an IM guide cath and then we advanced an 014 stabilizer wire.  This was then primarily stented with a balloon expandable bare-metal stent using a 6 mm x 18 mm Herculink.  The ostium of the stent was flared to 7 mm.  She now has widely patent SMA with no residual stenosis and excellent filling distally.             Procedure:  The patient was identified in the  holding area and taken to room 7.  The patient was then placed supine on the table and prepped and draped in the usual sterile fashion.  A time out was called.  Ultrasound was used to evaluate the right common femoral artery.  It was patent .  A digital ultrasound image was acquired.  A micropuncture needle was used to access the right common femoral artery under ultrasound guidance.  An 018 wire was advanced without resistance and a micropuncture sheath was placed.  The 018 wire was removed and a benson wire was placed.  The micropuncture sheath was exchanged for a 6 french sheath.  An omniflush catheter was advanced over the wire to the level of L-1.  Next the C-arm was placed in lateral position and a mesenteric arteriogram was obtained.  Patient was fairly uncooperative and it was difficult image given she was not really following breathing instruction throughout the case.  Ultimately we determined that the celiac was occluded as demonstrated on CTA with filling of the main branches through collaterals.  The SMA had a high-grade stenosis proximally.  Ultimately we then placed a long 6 [place] IM guide cath and with an 014 stabilizer wire cannulated the SMA.  We advanced the guide cath to advance into the ostium of the artery.  We then selected a 6 mm x 18 mm balloon expandable Herculink there was advanced over the stabilizer wire.  We pulled the guide cath back and took some hand injections in order to visualize the anterior wall of the aorta to allow the stent to hang out 1 to 2 mm beyond the ostium.  Ultimately the stent was deployed to nominal pressure.  We then used a 6 mm Viatrak balloon that was overinflated to flare the proximal end of the stent to 7 mm.  We tried to use our IM guide cath to get pictures and it appeared the stent was widely patent but again she was breathing throughout the case while we were trying to get good images.  We put a Omni Flush catheter back up and got one final mesenteric  arteriogram that showed a widely patent SMA with stent in place.  That point time wires and catheters removed a mynx closure was deployed in the right groin.  Plan: Patient will be loaded on Plavix and will need to start Plavix daily as well as aspirin if okay with GI and primary team.   Marty Heck, MD [facility]: [phone]    Scheduled Meds: . amLODipine  10 mg Oral QHS  . calcitRIOL  1 mcg Oral Q T,Th,Sa-HD  . Chlorhexidine Gluconate Cloth  6 each Topical Q0600  . clopidogrel  300 mg Oral Once   Followed by  . clopidogrel  75 mg Oral Q breakfast  . gabapentin  100 mg Oral Daily  . metoprolol tartrate  100 mg Oral BID  . mometasone-formoterol  2 puff Inhalation BID  . pantoprazole  40 mg Intravenous Q12H  . polyethylene glycol  17 g Oral Daily  . sodium chloride flush  3 mL Intravenous Q12H  . sodium chloride flush  3 mL Intravenous Q12H   Continuous Infusions: . sodium chloride     PRN Meds:.sodium chloride, acetaminophen **OR** acetaminophen, camphor-menthol **AND** hydrOXYzine, heparin, hydrALAZINE, ipratropium-albuterol, labetalol, lidocaine-prilocaine, ondansetron **OR** ondansetron (ZOFRAN) IV, polyvinyl alcohol, sodium chloride flush  ASSESMENT:    *   CGE, upper abdominal pain despite doses of Protonix 40 po bid, Carafate qid.   04/23/19 EGD with multiple duodenal ulcers, largest 15 mm and deeply cratered was too large for endoscopic intervention.  CTA 2/27 concerning for ischemia.  PAP 3/27 showing 18 mm ulcer distal to duodenal bulb concerning for impending perforation. On bid IV PPI and carafate q 6 h, sxs resolved.  04/27/2019 mesenteric arteriogram with angioplasty, stent to high-grade SMA stenosis.   *    Anemia requiring transfusion late February/early March 2021, resolved with stable Hb despite the limited hematemesis. Mircera q 2 weeks, venofer weekly per Nephrology.   Hgb stable.    *   ESRD.  On HD TTS.      *   Hx substance abuse, cocaine + on 03/25/19.  This certainly added fuel to the fire of mesenteric ischemia.      PLAN   *  Her GI MD is Dr Odie Sera, she has upcoming appointment to follow-up with him next Thursday.  I advised her that she ought to call his office and reschedule for a slightly later date probably in about 2 to 3 weeks rather than the less than 1 week appointment she has now.  *   Dr. Carlean Purl is fine with initiating low dose aspirin.  She should continue on Protonix 40 po bid. switch over to oral Protonix now.  Continue renal diet.    Christina [name]  04/28/2019, 11:20 AM Phone [phone]

## 2019-04-29 LAB — CBC
HCT: 33.8 % — ABNORMAL LOW (ref 36.0–46.0)
Hemoglobin: 10.3 g/dL — ABNORMAL LOW (ref 12.0–15.0)
MCH: 27.3 pg (ref 26.0–34.0)
MCHC: 30.5 g/dL (ref 30.0–36.0)
MCV: 89.7 fL (ref 80.0–100.0)
Platelets: 190 10*3/uL (ref 150–400)
RBC: 3.77 MIL/uL — ABNORMAL LOW (ref 3.87–5.11)
RDW: 16 % — ABNORMAL HIGH (ref 11.5–15.5)
WBC: 6.9 10*3/uL (ref 4.0–10.5)
nRBC: 0 % (ref 0.0–0.2)

## 2019-04-29 MED ORDER — CLOPIDOGREL BISULFATE 75 MG PO TABS: 75.0000 mg | ORAL_TABLET | Freq: Every day | ORAL | 0 refills | Status: AC

## 2019-04-29 MED ORDER — CHLORHEXIDINE GLUCONATE CLOTH 2 % EX PADS: 6.0000 | MEDICATED_PAD | Freq: Every day | CUTANEOUS | Status: DC

## 2019-04-29 MED ORDER — CALCITRIOL 0.5 MCG PO CAPS: 1.0000 ug | ORAL_CAPSULE | ORAL | 0 refills | Status: AC

## 2019-04-29 MED ORDER — ASPIRIN 81 MG PO TBEC: 81.0000 mg | DELAYED_RELEASE_TABLET | Freq: Every day | ORAL | 0 refills | Status: AC

## 2019-04-29 MED FILL — CLOPIDOGREL 75 MG TABLET: 75 | 30 days supply | Qty: 30 | Fill #0

## 2019-04-29 MED FILL — ASPIRIN LOW DOSE 81 MG TBEC: 81 | 30 days supply | Qty: 30 | Fill #0

## 2019-04-29 MED FILL — CALCITRIOL 0.5 MCG CAPS: 0.5 | 28 days supply | Qty: 24 | Fill #0

## 2019-04-29 NOTE — Progress Notes (Signed)
Discharge:  Discharge summary and medications reviewed with patient.  CCMD notified.  HD cath in right upper chest intact.  R wrist peripheral access discontinued.  Patient self packed belongings and ambulated with staff to exit.

## 2019-04-29 NOTE — Progress Notes (Signed)
Nephrology Progress Note:   Subjective:  Had HD on 4/1 with no UF.  (got 343mL)  Feels good and states she is going home today.  Denies any abd pain with HD yesterday.  Last BP does not appear accurate.  Prior was 155/63.   Review of systems   Denies any current n/v  Denies shortness of breath or chest pain No current abd pain  04/01 0701 - 04/02 0700 In: 51 [P.O.:600; I.V.:10] Out: -330 [Urine:1]  Filed Weights   04/28/19 0515 04/28/19 1215 04/29/19 0346  Weight: 39 kg 41.8 kg 40.8 kg    Scheduled Meds: . amLODipine  10 mg Oral QHS  . aspirin EC  81 mg Oral Daily  . calcitRIOL  1 mcg Oral Q T,Th,Sa-HD  . Chlorhexidine Gluconate Cloth  6 each Topical Q0600  . clopidogrel  300 mg Oral Once   Followed by  . clopidogrel  75 mg Oral Q breakfast  . gabapentin  100 mg Oral Daily  . metoprolol tartrate  100 mg Oral BID  . mometasone-formoterol  2 puff Inhalation BID  . pantoprazole  40 mg Oral BID  . polyethylene glycol  17 g Oral Daily  . sodium chloride flush  3 mL Intravenous Q12H  . sodium chloride flush  3 mL Intravenous Q12H   Continuous Infusions: . sodium chloride     PRN Meds:.sodium chloride, acetaminophen **OR** acetaminophen, camphor-menthol **AND** hydrOXYzine, heparin, hydrALAZINE, ipratropium-albuterol, labetalol, lidocaine-prilocaine, ondansetron **OR** ondansetron (ZOFRAN) IV, polyvinyl alcohol, sodium chloride flush  Current Labs: reviewed    Physical Exam:  Blood pressure (!) 148/113, pulse 66, temperature 98.6 F (37 C), temperature source Oral, resp. rate (!) 21, height 5\' 2"  (1.575 m), weight 40.8 kg, SpO2 99 %.  Gen - chronically ill appearing female  HEENT NCAT Neck supple trachea midline  Heart S1S2 no rub Lungs - clear to auscultation and unlabored  Neuro alert and oriented x 3; provides hx and follows commands  Psych normal mood and affect  Ext No edema Access: R IJ TDC c/d/i, no erythema or drainage from exit site; left AVF with bruit and  thrill some bruising over site     Hemodialysis prescription: TTS, [facility], blood flow rate 300, AF 1.5, 2K/2.0 calcium, 4 hours, RIJ TDC, EDW 40.5 kg, no heparin, Mircera 150 mcg every 2 weeks (last dose 3/23), Venofer 50 mg once a week, calcitriol 1 mcg q. hemodialysis  1. ESRD TTS [facility]   - HD per TTS schedule.  No heparin with HD and avoid hypotension with HD   2. Chronic mesenteric ischemia and s/p SMA stent with vascular on 3/31. On aspirin and plavix per vascular.  3. Duodenal ulcer, GI and vascular following.  S/p SMA stent.  Please avoid sucralafate with ESRD.  GI rec PPI BID.  Reinforced same.  GI recs follow-up with her primary gastroenterologist, Dr. Lyda Jester after discharge.  4. HTN:  Begin to UF with HD - just above EDW and may have lost weight with her illness. ischemic ulcer as above now s/p stent.  5. 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    7. Substance abuse - note cocaine positive 03/25/19; rec cessation today and she states has stopped.  Discussed importance.     Recent Labs  Lab 04/23/19 1245 04/24/19 1810 04/25/19 0328 04/26/19 0255 04/28/19 0307  NA   < >  --  139 136 135  K   < >  --  3.5 3.6 4.0  CL   < >  --  99 100 97*  CO2   < >  --  26 19* 25  GLUCOSE   < >  --  76 74 82  BUN   < >  --  10 14 14   CREATININE   < >  --  3.04* 4.68* 4.85*  CALCIUM   < >  --  8.4* 8.5* 8.7*  PHOS  --  6.3*  --  5.9* 5.5*   < > = values in this interval not displayed.   Recent Labs  Lab 04/26/19 0255 04/26/19 0255 04/27/19 0314 04/28/19 0307 04/29/19 0412  WBC 5.2   < > 4.8 5.4 6.9  NEUTROABS 2.7  --  2.8 3.7  --   HGB 12.0   < > 12.3 11.8* 10.3*  HCT 39.1   < > 40.2 38.5 33.8*  MCV 90.7   < > 88.9 89.1 89.7  PLT 268   < > 215 209 190   < > = values in this interval not displayed.     Claudia Desanctis 04/29/2019 1:39 PM

## 2019-04-29 NOTE — Progress Notes (Signed)
MOBILITY TEAM - Progress Note   04/29/19 1519  Mobility  Activity Ambulated in hall  Level of Assistance Independent  Assistive Device None  Distance Ambulated (ft) 490 ft  Mobility Response Tolerated well  Mobility performed by Mobility specialist   Mabeline Caras, PT, DPT Mobility Team Pager [phone]

## 2019-04-29 NOTE — Discharge Summary (Signed)
Physician Discharge Summary  Christina [name] XNA:[id number] DOB: [birth year] DOA: 04/23/2019  PCP: Maggie Schwalbe, PA-C  Admit date: 04/23/2019 Discharge date: 04/29/2019  Admitted From: home Disposition:  Home  Recommendations for Outpatient Follow-up:  1. Follow up with PCP in 1-2 weeks 2. Please obtain BMP/CBC in one week 3. 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 fib who presents with complaints of abdominal pain. She was admitted last time on 2/26 and was discharged on 3/3. In that hospitalization, she underwent EGD which showed gastritis, esophagitis, multiple duodenal ulcers. She was started on PPI and Carafateand she was taking them.CT abdomen on this admission showed duodenal ulcer, close to perforation. GI, general surgery consulted. Started on Protonix drip. Vascular surgery also consulted for finding of possible ischemic etiology for her ulceration.  Patient underwent SMA stent placement by vascular.  She was placed on aspirin and Plavix as per vascular with approval from gastroenterology.  Time patient has no symptoms after eating her meal she feels fine tolerating diet and would like to go home.  Hemoglobin is stable.  Discharge Diagnoses:   Duodenal Ulcer:CT Findings consistent with an 18 mm ulcer protruding posteriorly just distal to the duodenal bulb. Possibility of impending perforation.She has been admitted multiple times for abdominal pain, GI bleed requiring transfusion in the last 2 months. EGD on 2/27 performed by Dr. Bryan Lemma showed grade B esophagitis without bleeding, 2 cm hiatal hernia, mild gastritis, multiple ulcers in  the duodenal bulb.The largest ulcer was 15 mm and deeply cratered; it was too large for endoscopic intervention and was recommended for IR or surgical consultation if bleeding recurred. Protonix 40 twice daily,.GI is okay to continue on aspirin and Plavix.Patient will follow up with her GI Dr. Lyda Jester at [facility] 2-3 weeks.  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 Biopsies of ulcer didnt show malignancy.  Vascular recommends Plavix at minimum and preferably 81 mg aspirin with dual antiplatelet therapy, seen by GI and okay for addition of aspirin as well.  Monitor cbc  outpatient basis.  Patient advised and she will follow up with PCP for CBC check in a week. A. fib with RVR: Presented with A. fib with RVR. History of proximal A. fib. Currently heart rate is well controlled and is in sinus. Poor candidate for anticoagulation due to large persistent duodenal ulcer. On metoprolol for rate control. Bilateral numbness and tingling on her feet, seen by VVS suspecting neuropathy and placed on Neurontin 100 mg once a day and uptitrate slowly as outpatient given esrd.  Patient does take 300 mg GAABPENTIN at bedtime and she would like to continue on the same. ESRD on dialysis:Dialyzed on TTS schedule. Nephrology following. Coronary artery disease:Denies any chest pain at present. Continue current management Hypertension:Monitor BP. Continue her home meds History of COPD: Not on exacerbation at present. Continue Combivent, Symbicorct Hyperlipidemia: On Pravachol at home. Cocaine abuse: UDS positive for cocaine on 2/26. Counseled for cessation    Diet Order                  Diet  renal with fluid restriction Fluid restriction: 1200 mL Fluid; Room  service appropriate? Yes; Fluid consistency: Thin  Diet effective now             Body mass index is 15.71 kg/m.  DVT prophylaxis:SCD Code Status: FULL Family Communication: plan of care discussed with patient at bedside. Disposition Plan: Patient is from:home Anticipated Disposition: home Barriers to discharge or conditions that needs to be met prior to discharge: home today.  Consultants:GI,general surgery,Nephrology, vas surgery Procedures:see note Microbiology:see note   Subjective: Denies any nausea vomiting abdominal pain.  Feeling well.  Would like to go home today.  Discharge Exam: Vitals:   04/29/19 0346 04/29/19 0808  BP: (!) 148/113   Pulse: 66   Resp: (!) 21   Temp: 98.6 F (37 C)   SpO2: 100% 99%   General: Pt is alert, awake, not in acute distress Cardiovascular: RRR, S1/S2 +, no rubs, no gallops Respiratory: CTA bilaterally, no wheezing, no rhonchi Abdominal: Soft, NT, ND, bowel sounds + Extremities: no edema, no cyanosis  Discharge Instructions  Discharge Instructions    Diet - low sodium heart healthy   Complete by: As directed    Discharge instructions   Complete by: As directed    Please call call MD or return to ER for similar or worsening recurring problem that brought you to hospital or if any fever,nausea/vomiting,abdominal pain, uncontrolled pain, chest pain,  shortness of breath or any other alarming symptoms.  Please follow-up your doctor as instructed in a week time and GET CBC CHECKED, call the office for appointment.  Please avoid alcohol, smoking, or any other illicit substance and maintain healthy habits including taking your regular medications as prescribed.  You were cared for by a hospitalist during your hospital stay. If you have any questions about your discharge medications or the care you received while you were in the hospital after you are discharged, you can call the unit and ask to speak with the hospitalist on call if  the hospitalist that took care of you is not available.  Once you are discharged, your primary care physician will handle any further medical issues. Please note that NO REFILLS for any discharge medications will be authorized once you are discharged, as it is imperative that you return to your primary care physician (or establish a relationship with a primary care physician if you do not have one) for your aftercare needs so that they can reassess your need for medications and monitor your lab values   Increase activity slowly   Complete by: As directed      Allergies as of 04/29/2019      Reactions   Cyclobenzaprine Anaphylaxis, Other (See Comments)   "stopped heart"   Morphine Anaphylaxis   "stopped heart"   Penicillins Shortness Of Breath, Swelling, Palpitations   Did it involve swelling of the face/tongue/throat, SOB, or low BP? Yes Did it involve sudden or severe rash/hives, skin peeling, or any reaction on the inside of your mouth or nose? No Did you need to seek medical attention at a hospital or doctor's office? No When did it last happen?years  If all above answers are "NO", may proceed with cephalosporin use.   Ambien [zolpidem] Other (See Comments)   Severe confusion   Codeine Itching, Rash   Hydromorphone Other (See Comments)   If administered quickly, felt like hand was "exploding"       Medication List    TAKE these medications   albuterol (2.5 MG/3ML)  0.083% nebulizer solution Commonly known as: PROVENTIL Take 2.5 mg by nebulization every 6 (six) hours as needed for wheezing or shortness of breath.   allopurinol 100 MG tablet Commonly known as: ZYLOPRIM Take 100 mg by mouth daily.   amLODipine 10 MG tablet Commonly known as: NORVASC Take 10 mg by mouth at bedtime.   aspirin 81 MG EC tablet Take 1 tablet (81 mg total) by mouth daily. Start taking on: April 30, 2019   Auryxia 1 GM 210 MG(Fe) tablet Generic drug: ferric citrate Take 420 mg by mouth See  admin instructions. Take 2 tablets (420 mg) by mouth with each meal/snack - up to four times daily   budesonide-formoterol 160-4.5 MCG/ACT inhaler Commonly known as: SYMBICORT Inhale 2 puffs into the lungs every 4 (four) hours as needed (shortness of breath/wheezing).   calcitRIOL 0.5 MCG capsule Commonly known as: ROCALTROL Take 2 capsules (1 mcg total) by mouth Every Tuesday,Thursday,and Saturday with dialysis. Start taking on: April 30, 2019   clobetasol 0.05 % external solution Commonly known as: TEMOVATE Apply 1 application topically daily as needed (psoriasis).   clopidogrel 75 MG tablet Commonly known as: PLAVIX Take 1 tablet (75 mg total) by mouth daily with breakfast. Start taking on: April 30, 2019   dicyclomine 10 MG capsule Commonly known as: BENTYL Take 20 mg by mouth at bedtime.   diphenhydrAMINE 25 MG tablet Commonly known as: BENADRYL Take 50 mg by mouth at bedtime.   docusate sodium 100 MG capsule Commonly known as: COLACE Take 1 capsule (100 mg total) by mouth 2 (two) times daily. What changed:   when to take this  reasons to take this   gabapentin 300 MG capsule Commonly known as: NEURONTIN Take 1 capsule (300 mg total) by mouth at bedtime.   Ipratropium-Albuterol 20-100 MCG/ACT Aers respimat Commonly known as: COMBIVENT Inhale 2 puffs into the lungs 4 (four) times daily as needed for wheezing.   isosorbide mononitrate 30 MG 24 hr tablet Commonly known as: IMDUR Take 30 mg by mouth at bedtime.   lidocaine-prilocaine cream Commonly known as: EMLA Apply 1 application topically daily as needed (pain at the site of port access).   LUBRICATING EYE DROPS OP Place 1 drop into both eyes daily as needed (dry eyes).   metoprolol tartrate 100 MG tablet Commonly known as: LOPRESSOR Take 1 tablet (100 mg total) by mouth 2 (two) times daily.   nitroGLYCERIN 0.4 MG SL tablet Commonly known as: NITROSTAT Place 0.4 mg under the tongue every 5 (five)  minutes as needed for chest pain.   ondansetron 4 MG tablet Commonly known as: ZOFRAN Take 4 mg by mouth 3 (three) times daily as needed for nausea.   pantoprazole 40 MG tablet Commonly known as: PROTONIX Take 1 tablet (40 mg total) by mouth 2 (two) times daily.   polyethylene glycol 17 g packet Commonly known as: MIRALAX / GLYCOLAX Take 17 g by mouth daily as needed for mild constipation. What changed: when to take this   sennosides-docusate sodium 8.6-50 MG tablet Commonly known as: SENOKOT-S Take 1 tablet by mouth 2 (two) times daily.   sucralfate 1 GM/10ML suspension Commonly known as: CARAFATE Take 10 mLs (1 g total) by mouth every 6 (six) hours.   Vitamin D3 50 MCG (2000 UT) capsule Take 2,000 Units by mouth daily.      Follow-up Information    Marty Heck, MD Follow up in 1 month(s).   Specialty: Vascular Surgery Why: Office will call  you with appointment (sent) Contact information: [place] [address] [phone]        Maggie Schwalbe, PA-C Follow up in 1 week(s).   Specialty: Physician Assistant Why: CBC CHECK Contact information: [place] [address] [phone]          Allergies  Allergen Reactions  . Cyclobenzaprine Anaphylaxis and Other (See Comments)    "stopped heart"   . Morphine Anaphylaxis    "stopped heart"  . Penicillins Shortness Of Breath, Swelling and Palpitations    Did it involve swelling of the face/tongue/throat, SOB, or low BP? Yes Did it involve sudden or severe rash/hives, skin peeling, or any reaction on the inside of your mouth or nose? No Did you need to seek medical attention at a hospital or doctor's office? No When did it last happen?years  If all above answers are "NO", may proceed with cephalosporin use.   . Ambien [Zolpidem] Other (See Comments)    Severe confusion  . Codeine Itching and Rash  . Hydromorphone Other (See Comments)    If  administered quickly, felt like hand was "exploding"     The results of significant diagnostics from this hospitalization (including imaging, microbiology, ancillary and laboratory) are listed below for reference.    Microbiology: Recent Results (from the past 240 hour(s))  Respiratory Panel by RT PCR (Flu A&B, Covid) - Nasopharyngeal Swab     Status: None   Collection Time: 04/23/19  5:55 PM   Specimen: Nasopharyngeal Swab  Result Value Ref Range Status   SARS Coronavirus 2 by RT PCR NEGATIVE NEGATIVE Final    Comment: (NOTE) SARS-CoV-2 target nucleic acids are NOT DETECTED. The SARS-CoV-2 RNA is generally detectable in upper respiratoy specimens during the acute phase of infection. The lowest concentration of SARS-CoV-2 viral copies this assay can detect is 131 copies/mL. A negative result does not preclude SARS-Cov-2 infection and should not be used as the sole basis for treatment or other patient management decisions. A negative result may occur with  improper specimen collection/handling, submission of specimen other than nasopharyngeal swab, presence of viral mutation(s) within the areas targeted by this assay, and inadequate number of viral copies (<131 copies/mL). A negative result must be combined with clinical observations, patient history, and epidemiological information. The expected result is Negative. Fact Sheet for Patients:  PinkCheek.be Fact Sheet for Healthcare Providers:  GravelBags.it This test is not yet ap proved or cleared by the [place] FDA and  has been authorized for detection and/or diagnosis of SARS-CoV-2 by FDA under an Emergency Use Authorization (EUA). This EUA will remain  in effect (meaning this test can be used) for the duration of the COVID-19 declaration under Section 564(b)(1) of the Act, 21 U.S.C. section 360bbb-3(b)(1), unless the authorization is terminated or revoked sooner.     Influenza A by PCR NEGATIVE NEGATIVE Final   Influenza B by PCR NEGATIVE NEGATIVE Final    Comment: (NOTE) The Xpert Xpress SARS-CoV-2/FLU/RSV assay is intended as an aid in  the diagnosis of influenza from Nasopharyngeal swab specimens and  should not be used as a sole basis for treatment. Nasal washings and  aspirates are unacceptable for Xpert Xpress SARS-CoV-2/FLU/RSV  testing. Fact Sheet for Patients: PinkCheek.be Fact Sheet for Healthcare Providers: GravelBags.it This test is not yet approved or cleared by the [place] FDA and  has been authorized for detection and/or diagnosis of SARS-CoV-2 by  FDA under an Emergency Use Authorization (EUA). This  EUA will remain  in effect (meaning this test can be used) for the duration of the  Covid-19 declaration under Section 564(b)(1) of the Act, 21  U.S.C. section 360bbb-3(b)(1), unless the authorization is  terminated or revoked. Performed at [facility], [place] [address] [place],  [address]     Procedures/Studies: CT ABDOMEN PELVIS W CONTRAST  Result Date: 04/23/2019 CLINICAL DATA:  Nausea, vomiting, and diarrhea. Tachycardia. EXAM: CT ABDOMEN AND PELVIS WITH CONTRAST 2:19 p.m. TECHNIQUE: Multidetector CT imaging of the abdomen and pelvis was performed using the standard protocol following bolus administration of intravenous contrast. CONTRAST:  131m OMNIPAQUE IOHEXOL 300 MG/ML  SOLN COMPARISON:  [facility] FINDINGS: Lower chest: No acute abnormality. Severe atherosclerosis of the distal thoracic aorta. Slight linear scarring at the left lung base, stable. Hepatobiliary, stomach/Bowel: A small amount of fluid surrounds the contracted gallbladder. The gallbladder has contracted slightly since the prior study. The fluid extends around the duodenum just distal to the duodenal bulb. Slight  prominence of multiple distal small bowel loops without obstruction. This is nonspecific. Appendix has been removed. There appears to be an 18 mm ulcer protruding posteriorly just distal to the duodenal bulb best seen on image 38 of series 7 and image 25 of series 3 and image 42 of series 6. This was not definable on the prior study done earlier today. Pancreas: Unremarkable. No pancreatic ductal dilatation or surrounding inflammatory changes. Spleen: Normal in size without focal abnormality. Adrenals/Urinary Tract: Bilateral adrenal adenomas, unchanged. Multiple bilateral renal cysts. No hydronephrosis. Bladder is normal. Vascular/Lymphatic: Extensive aortic atherosclerosis. No adenopathy. Reproductive: Status post hysterectomy. No adnexal masses. Other: No ascites. No abdominal wall hernias. Musculoskeletal: No acute or significant osseous findings. IMPRESSION: 1. Findings consistent with an 18 mm ulcer protruding posteriorly just distal to the duodenal bulb. There is fluid surrounding this ulcer and extending around the gallbladder which could represent impending perforation. 2. Small amount of fluid surrounds the contracted gallbladder. 3. Slight prominence of multiple distal small bowel loops without obstruction. This is nonspecific. 4. Stable bilateral adrenal adenomas. 5. Severe atherosclerosis of the distal thoracic aorta. Aortic Atherosclerosis (ICD10-I70.0). Electronically Signed   By: JLorriane ShireM.D.   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  Ultrasound guided access of the right common femoral artery 2.  Mesenteric arteriogram 3.  SMA angioplasty with  stent placement (6 mm x 18 mm Herculink balloon expandable bare-metal stent) 4.  Mynx closure of the right common femoral artery 5.  29 minutes of monitored moderate conscious sedation time  Indications: Patient is a 69year old female who presented with upper abdominal pain and also found to have a duodenal ulcer.  She has been followed by GI.  She was found to have occlusion of her celiac artery and a suspected high-grade narrowing of her superior mesenteric artery.  She presents today for planned mesenteric arteriogram and possible intervention after risk and benefits are discussed.  Findings:  Mesenteric arteriogram shows an occluded celiac artery at the ostium with reconstitution of distal main branches through collaterals.  The SMA has a high-grade proximal stenosis but it measures 6 mm and much healthier in the mid to distal segment.  Ultimately the SMA was cannulated with an IM guide cath and then we advanced an 014 stabilizer wire.  This was then primarily stented with a balloon expandable bare-metal stent using a 6 mm x 18 mm Herculink.  The ostium of the stent was flared to 7 mm.  She now has widely patent SMA with no residual stenosis and excellent filling distally.             Procedure:  The patient was identified in the holding area and taken to room 7.  The patient was then placed supine on the table and prepped and draped in the usual sterile fashion.  A time out was called.  Ultrasound was used to evaluate the right common femoral artery.  It was patent .  A digital ultrasound image was acquired.  A micropuncture needle was used to access the right common femoral artery under ultrasound guidance.  An 018 wire was advanced without resistance and a micropuncture sheath was placed.  The 018 wire was removed and a benson wire was placed.  The micropuncture sheath was exchanged for a 6 french sheath.  An omniflush catheter was advanced over the wire to the level of L-1.  Next the C-arm was placed in  lateral position and a mesenteric arteriogram was obtained.  Patient was fairly uncooperative and it was difficult image given she was not really following breathing instruction throughout the case.  Ultimately we determined that the celiac was occluded as demonstrated on CTA with filling of the main branches through collaterals.  The SMA had a high-grade stenosis proximally.  Ultimately we then placed a long 6 [place] IM guide cath and with an 014 stabilizer wire cannulated the SMA.  We advanced the guide cath to advance into the ostium of the artery.  We then selected a 6 mm x 18 mm balloon expandable Herculink there was advanced over the stabilizer wire.  We pulled the guide cath back and took some hand injections in order to visualize the anterior wall of the aorta to allow the stent to hang out 1 to 2 mm beyond the ostium.  Ultimately the stent was deployed to nominal pressure.  We then used a 6 mm Viatrak balloon that was overinflated to flare the proximal end of the stent to 7 mm.  We tried to use our IM guide cath to get pictures and it appeared the stent was widely patent but again she was breathing throughout the case while we were trying to get good images.  We put a Omni Flush catheter back up and got one final mesenteric arteriogram that showed a widely patent SMA with stent in place.  That point time wires and catheters removed a mynx closure was deployed in the right groin.  Plan: Patient will be loaded on Plavix and will need to start Plavix daily as well as aspirin if okay with GI and primary team.   Marty Heck, MD [facility] Office: [phone]    DG Chest Port 1 View  Result Date: 04/23/2019 CLINICAL DATA:  Abdominal pain EXAM: PORTABLE CHEST 1 VIEW COMPARISON:  Chest radiograph dated 04/11/2019 FINDINGS: The heart is enlarged. Vascular calcifications are seen in the aortic arch. Both lungs are clear. The visualized skeletal structures are  unremarkable. A right internal jugular central venous catheter tip overlies the right atrium. IMPRESSION:  No active disease. Electronically Signed   By: Zerita Boers M.D.   On: 04/23/2019 13:39   VAS [place] ABI WITH/WO TBI  Result Date: 04/25/2019 LOWER EXTREMITY DOPPLER STUDY Indications: Claudication. High Risk         Hypertension, hyperlipidemia, past history of smoking, prior Factors:          MI.  Comparison Study: No prior exam. Performing Technologist: Baldwin Crown RVT, RDMS  Examination Guidelines: A complete evaluation includes at minimum, Doppler waveform signals and systolic blood pressure reading at the level of bilateral brachial, anterior tibial, and posterior tibial arteries, when vessel segments are accessible. Bilateral testing is considered an integral part of a complete examination. Photoelectric Plethysmograph (PPG) waveforms and toe systolic pressure readings are included as required and additional duplex testing as needed. Limited examinations for reoccurring indications may be performed as noted.  ABI Findings: +---------+------------------+-----+-------------------+--------+ Right    Rt Pressure (mmHg)IndexWaveform           Comment  +---------+------------------+-----+-------------------+--------+ Brachial 122                    triphasic                   +---------+------------------+-----+-------------------+--------+ PTA      104               0.85 dampened monophasic         +---------+------------------+-----+-------------------+--------+ DP       114               0.93 dampened monophasic         +---------+------------------+-----+-------------------+--------+ Great Toe43                0.35 Abnormal                    +---------+------------------+-----+-------------------+--------+ +---------+------------------+-----+-------------------+-------------+ Left     Lt Pressure (mmHg)IndexWaveform           Comment        +---------+------------------+-----+-------------------+-------------+ Brachial                                           UTO - fistula +---------+------------------+-----+-------------------+-------------+ PTA      130               1.07 monophasic                       +---------+------------------+-----+-------------------+-------------+ DP       92                0.75 dampened monophasic              +---------+------------------+-----+-------------------+-------------+ Great Toe59                0.48 Abnormal                         +---------+------------------+-----+-------------------+-------------+ +-------+-----------+-----------+------------+------------+ ABI/TBIToday's ABIToday's TBIPrevious ABIPrevious TBI +-------+-----------+-----------+------------+------------+ Right  0.93       0.35                                +-------+-----------+-----------+------------+------------+ Left   1.07       0.48                                +-------+-----------+-----------+------------+------------+  Summary: Right: Resting right ankle-brachial index indicates mild right lower extremity arterial disease. The right toe-brachial index is abnormal. Left: Resting left ankle-brachial index is within normal range. No evidence of significant left lower extremity arterial disease. The left toe-brachial index is abnormal.  *See table(s) above for measurements and observations.  Electronically signed by Ruta Hinds MD on 04/25/2019 at 8:29:38 PM.    Final     Labs: BNP (last 3 results) Recent Labs    01/24/19 2228 02/04/19 0103 03/25/19 1313  BNP 1,570.0* 4,197.2* 2,500.3*   Basic Metabolic Panel: Recent Labs  Lab 04/23/19 1245 04/24/19 1810 04/25/19 0328 04/26/19 0255 04/28/19 0307  NA 136  --  139 136 135  K 4.3  --  3.5 3.6 4.0  CL 99  --  99 100 97*  CO2 22  --  26 19* 25  GLUCOSE 117*  --  76 74 82  BUN 31*  --  '10 14 14  ' CREATININE 5.24*  --  3.04*  4.68* 4.85*  CALCIUM 8.8*  --  8.4* 8.5* 8.7*  PHOS  --  6.3*  --  5.9* 5.5*   Liver Function Tests: Recent Labs  Lab 04/23/19 1245 04/26/19 0255 04/28/19 0307  AST 19  --   --   ALT 12  --   --   ALKPHOS 69  --   --   BILITOT 0.5  --   --   PROT 6.5  --   --   ALBUMIN 3.0* 3.2* 2.8*   Recent Labs  Lab 04/23/19 1245  LIPASE 76*   No results for input(s): AMMONIA in the last 168 hours. CBC: Recent Labs  Lab 04/25/19 0328 04/26/19 0255 04/27/19 0314 04/28/19 0307 04/29/19 0412  WBC 5.3 5.2 4.8 5.4 6.9  NEUTROABS 3.3 2.7 2.8 3.7  --   HGB 12.1 12.0 12.3 11.8* 10.3*  HCT 39.5 39.1 40.2 38.5 33.8*  MCV 90.0 90.7 88.9 89.1 89.7  PLT 255 268 215 209 190   Cardiac Enzymes: No results for input(s): CKTOTAL, CKMB, CKMBINDEX, TROPONINI in the last 168 hours. BNP: Invalid input(s): POCBNP CBG: Recent Labs  Lab 04/23/19 1232  GLUCAP 111*   D-Dimer No results for input(s): DDIMER in the last 72 hours. Hgb A1c No results for input(s): HGBA1C in the last 72 hours. Lipid Profile No results for input(s): CHOL, HDL, LDLCALC, TRIG, CHOLHDL, LDLDIRECT in the last 72 hours. Thyroid function studies No results for input(s): TSH, T4TOTAL, T3FREE, THYROIDAB in the last 72 hours.  Invalid input(s): FREET3 Anemia work up No results for input(s): VITAMINB12, FOLATE, FERRITIN, TIBC, IRON, RETICCTPCT in the last 72 hours. Urinalysis    Component Value Date/Time   COLORURINE YELLOW 04/23/2019 1440   APPEARANCEUR CLEAR 04/23/2019 1440   LABSPEC 1.029 04/23/2019 1440   PHURINE 8.0 04/23/2019 1440   GLUCOSEU NEGATIVE 04/23/2019 1440   HGBUR NEGATIVE 04/23/2019 1440   BILIRUBINUR NEGATIVE 04/23/2019 1440   KETONESUR NEGATIVE 04/23/2019 1440   PROTEINUR >=300 (A) 04/23/2019 1440   NITRITE NEGATIVE 04/23/2019 1440   LEUKOCYTESUR NEGATIVE 04/23/2019 1440   Sepsis Labs Invalid input(s): PROCALCITONIN,  WBC,  LACTICIDVEN Microbiology Recent Results (from the past 240 hour(s))   Respiratory Panel by RT PCR (Flu A&B, Covid) - Nasopharyngeal Swab     Status: None   Collection Time: 04/23/19  5:55 PM   Specimen: Nasopharyngeal Swab  Result Value Ref Range Status   SARS Coronavirus 2 by RT PCR NEGATIVE NEGATIVE Final    Comment: (NOTE)  SARS-CoV-2 target nucleic acids are NOT DETECTED. The SARS-CoV-2 RNA is generally detectable in upper respiratoy specimens during the acute phase of infection. The lowest concentration of SARS-CoV-2 viral copies this assay can detect is 131 copies/mL. A negative result does not preclude SARS-Cov-2 infection and should not be used as the sole basis for treatment or other patient management decisions. A negative result may occur with  improper specimen collection/handling, submission of specimen other than nasopharyngeal swab, presence of viral mutation(s) within the areas targeted by this assay, and inadequate number of viral copies (<131 copies/mL). A negative result must be combined with clinical observations, patient history, and epidemiological information. The expected result is Negative. Fact Sheet for Patients:  PinkCheek.be Fact Sheet for Healthcare Providers:  GravelBags.it This test is not yet ap proved or cleared by the [place] FDA and  has been authorized for detection and/or diagnosis of SARS-CoV-2 by FDA under an Emergency Use Authorization (EUA). This EUA will remain  in effect (meaning this test can be used) for the duration of the COVID-19 declaration under Section 564(b)(1) of the Act, 21 U.S.C. section 360bbb-3(b)(1), unless the authorization is terminated or revoked sooner.    Influenza A by PCR NEGATIVE NEGATIVE Final   Influenza B by PCR NEGATIVE NEGATIVE Final    Comment: (NOTE) The Xpert Xpress SARS-CoV-2/FLU/RSV assay is intended as an aid in  the diagnosis of influenza from Nasopharyngeal swab specimens and  should not be used as a sole  basis for treatment. Nasal washings and  aspirates are unacceptable for Xpert Xpress SARS-CoV-2/FLU/RSV  testing. Fact Sheet for Patients: PinkCheek.be Fact Sheet for Healthcare Providers: GravelBags.it This test is not yet approved or cleared by the [place] FDA and  has been authorized for detection and/or diagnosis of SARS-CoV-2 by  FDA under an Emergency Use Authorization (EUA). This EUA will remain  in effect (meaning this test can be used) for the duration of the  Covid-19 declaration under Section 564(b)(1) of the Act, 21  U.S.C. section 360bbb-3(b)(1), unless the authorization is  terminated or revoked. Performed at [facility], [place] [address], [place], [place] [address]      Time coordinating discharge: 25  minutes  SIGNED: Antonieta Pert, MD  [facility] 04/29/2019, 12:36 PM  If 7PM-7AM, please contact night-coverage [email or web address]

## 2019-04-29 NOTE — Progress Notes (Signed)
  Progress Note    04/29/2019 10:03 AM 2 Days Post-Op  Subjective:  Feels well this morning. She is wanting to go home. Wondering why they have not brought her breakfast. She is tolerating diet. She denies any abdominal pain, nausea, or vomiting  Vitals:   04/29/19 0346 04/29/19 0808  BP: (!) 148/113   Pulse: 66   Resp: (!) 21   Temp: 98.6 F (37 C)   SpO2: 100% 99%   Physical Exam: General: well nourished, not in any pain Lungs:  Non labored Extremities:  Right common femoral access site clean, dry and intact. Ecchymosis present. No hematoma. No palpable pseudo or pulsatile mass. 2+ femoral pulses bilaterally.  Abdomen: soft, non tender, non distended Neurologic: Alert and oriented  CBC    Component Value Date/Time   WBC 6.9 04/29/2019 0412   RBC 3.77 (L) 04/29/2019 0412   HGB 10.3 (L) 04/29/2019 0412   HCT 33.8 (L) 04/29/2019 0412   PLT 190 04/29/2019 0412   MCV 89.7 04/29/2019 0412   MCH 27.3 04/29/2019 0412   MCHC 30.5 04/29/2019 0412   RDW 16.0 (H) 04/29/2019 0412   LYMPHSABS 1.0 04/28/2019 0307   MONOABS 0.5 04/28/2019 0307   EOSABS 0.1 04/28/2019 0307   BASOSABS 0.0 04/28/2019 0307    BMET    Component Value Date/Time   NA 135 04/28/2019 0307   K 4.0 04/28/2019 0307   CL 97 (L) 04/28/2019 0307   CO2 25 04/28/2019 0307   GLUCOSE 82 04/28/2019 0307   BUN 14 04/28/2019 0307   CREATININE 4.85 (H) 04/28/2019 0307   CALCIUM 8.7 (L) 04/28/2019 0307   GFRNONAA 9 (L) 04/28/2019 0307   GFRAA 10 (L) 04/28/2019 0307    INR    Component Value Date/Time   INR 1.1 03/25/2019 1313     Intake/Output Summary (Last 24 hours) at 04/29/2019 1003 Last data filed at 04/28/2019 2200 Gross per 24 hour  Intake 240 ml  Output -331 ml  Net 571 ml     Assessment/Plan:  69 y.o. female is s/p mesenteric arteriogram, SMA angioplasty with stent placement, mynx closure of the right common femoral artery 2 Days Post-Op. Bilateral lower extremities well perfused. Continue  Aspirin and Plavix. Dr. Oneida Alar started her on low dose Neurontin which she can go home on. She is okay from vascular standpoint for discharge. She has a follow up with mesenteric duplex in 4 weeks  DVT prophylaxis:     Karoline Caldwell, PA-C [facility] [phone] 04/29/2019 10:03 AM

## 2019-05-09 ENCOUNTER — Inpatient Hospital Stay
Admission: AD | Admit: 2019-05-09 | Payer: Medicare Other | Source: Other Acute Inpatient Hospital | Admitting: Internal Medicine

## 2019-05-10 ENCOUNTER — Encounter (HOSPITAL_COMMUNITY): Payer: Self-pay | Admitting: Emergency Medicine

## 2019-05-10 ENCOUNTER — Other Ambulatory Visit: Payer: Self-pay

## 2019-05-10 ENCOUNTER — Observation Stay (HOSPITAL_COMMUNITY)
Admission: EM | Admit: 2019-05-10 | Discharge: 2019-05-12 | Disposition: A | Discharge status: Home / Self Care | Payer: Medicare Other | Attending: Internal Medicine | Admitting: Internal Medicine

## 2019-05-10 DIAGNOSIS — K219 Gastro-esophageal reflux disease without esophagitis: Secondary | ICD-10-CM | POA: Diagnosis not present

## 2019-05-10 DIAGNOSIS — K2990 Gastroduodenitis, unspecified, without bleeding: Secondary | ICD-10-CM

## 2019-05-10 DIAGNOSIS — Z955 Presence of coronary angioplasty implant and graft: Secondary | ICD-10-CM | POA: Insufficient documentation

## 2019-05-10 DIAGNOSIS — M1990 Unspecified osteoarthritis, unspecified site: Secondary | ICD-10-CM | POA: Insufficient documentation

## 2019-05-10 DIAGNOSIS — Z992 Dependence on renal dialysis: Secondary | ICD-10-CM | POA: Diagnosis not present

## 2019-05-10 DIAGNOSIS — Z885 Allergy status to narcotic agent status: Secondary | ICD-10-CM | POA: Insufficient documentation

## 2019-05-10 DIAGNOSIS — E8770 Fluid overload, unspecified: Secondary | ICD-10-CM | POA: Diagnosis not present

## 2019-05-10 DIAGNOSIS — I1311 Hypertensive heart and chronic kidney disease without heart failure, with stage 5 chronic kidney disease, or end stage renal disease: Secondary | ICD-10-CM | POA: Diagnosis not present

## 2019-05-10 DIAGNOSIS — Z880 Allergy status to penicillin: Secondary | ICD-10-CM | POA: Insufficient documentation

## 2019-05-10 DIAGNOSIS — J810 Acute pulmonary edema: Secondary | ICD-10-CM | POA: Insufficient documentation

## 2019-05-10 DIAGNOSIS — M109 Gout, unspecified: Secondary | ICD-10-CM | POA: Diagnosis not present

## 2019-05-10 DIAGNOSIS — J441 Chronic obstructive pulmonary disease with (acute) exacerbation: Secondary | ICD-10-CM | POA: Diagnosis not present

## 2019-05-10 DIAGNOSIS — K2100 Gastro-esophageal reflux disease with esophagitis, without bleeding: Secondary | ICD-10-CM | POA: Diagnosis not present

## 2019-05-10 DIAGNOSIS — D3502 Benign neoplasm of left adrenal gland: Secondary | ICD-10-CM | POA: Insufficient documentation

## 2019-05-10 DIAGNOSIS — N281 Cyst of kidney, acquired: Secondary | ICD-10-CM | POA: Insufficient documentation

## 2019-05-10 DIAGNOSIS — E8889 Other specified metabolic disorders: Secondary | ICD-10-CM | POA: Diagnosis not present

## 2019-05-10 DIAGNOSIS — Z20822 Contact with and (suspected) exposure to covid-19: Secondary | ICD-10-CM | POA: Diagnosis not present

## 2019-05-10 DIAGNOSIS — K269 Duodenal ulcer, unspecified as acute or chronic, without hemorrhage or perforation: Secondary | ICD-10-CM | POA: Diagnosis not present

## 2019-05-10 DIAGNOSIS — J189 Pneumonia, unspecified organism: Secondary | ICD-10-CM | POA: Clinically undetermined

## 2019-05-10 DIAGNOSIS — J9601 Acute respiratory failure with hypoxia: Secondary | ICD-10-CM | POA: Diagnosis not present

## 2019-05-10 DIAGNOSIS — D631 Anemia in chronic kidney disease: Secondary | ICD-10-CM | POA: Diagnosis not present

## 2019-05-10 DIAGNOSIS — Z9119 Patient's noncompliance with other medical treatment and regimen: Secondary | ICD-10-CM | POA: Insufficient documentation

## 2019-05-10 DIAGNOSIS — G43909 Migraine, unspecified, not intractable, without status migrainosus: Secondary | ICD-10-CM | POA: Insufficient documentation

## 2019-05-10 DIAGNOSIS — F319 Bipolar disorder, unspecified: Secondary | ICD-10-CM | POA: Insufficient documentation

## 2019-05-10 DIAGNOSIS — K2970 Gastritis, unspecified, without bleeding: Secondary | ICD-10-CM | POA: Diagnosis not present

## 2019-05-10 DIAGNOSIS — N186 End stage renal disease: Secondary | ICD-10-CM | POA: Diagnosis not present

## 2019-05-10 DIAGNOSIS — Z9114 Patient's other noncompliance with medication regimen: Secondary | ICD-10-CM | POA: Insufficient documentation

## 2019-05-10 DIAGNOSIS — J449 Chronic obstructive pulmonary disease, unspecified: Secondary | ICD-10-CM | POA: Diagnosis present

## 2019-05-10 DIAGNOSIS — R0600 Dyspnea, unspecified: Secondary | ICD-10-CM

## 2019-05-10 DIAGNOSIS — J9691 Respiratory failure, unspecified with hypoxia: Secondary | ICD-10-CM | POA: Diagnosis present

## 2019-05-10 DIAGNOSIS — K2980 Duodenitis without bleeding: Secondary | ICD-10-CM | POA: Diagnosis not present

## 2019-05-10 DIAGNOSIS — Z87442 Personal history of urinary calculi: Secondary | ICD-10-CM | POA: Insufficient documentation

## 2019-05-10 DIAGNOSIS — E875 Hyperkalemia: Secondary | ICD-10-CM | POA: Insufficient documentation

## 2019-05-10 DIAGNOSIS — I739 Peripheral vascular disease, unspecified: Secondary | ICD-10-CM | POA: Insufficient documentation

## 2019-05-10 DIAGNOSIS — I10 Essential (primary) hypertension: Secondary | ICD-10-CM | POA: Diagnosis present

## 2019-05-10 DIAGNOSIS — E785 Hyperlipidemia, unspecified: Secondary | ICD-10-CM | POA: Insufficient documentation

## 2019-05-10 DIAGNOSIS — Z90710 Acquired absence of both cervix and uterus: Secondary | ICD-10-CM | POA: Insufficient documentation

## 2019-05-10 DIAGNOSIS — Z87891 Personal history of nicotine dependence: Secondary | ICD-10-CM | POA: Insufficient documentation

## 2019-05-10 DIAGNOSIS — I480 Paroxysmal atrial fibrillation: Secondary | ICD-10-CM | POA: Diagnosis not present

## 2019-05-10 DIAGNOSIS — Z9115 Patient's noncompliance with renal dialysis: Secondary | ICD-10-CM | POA: Insufficient documentation

## 2019-05-10 DIAGNOSIS — Z888 Allergy status to other drugs, medicaments and biological substances status: Secondary | ICD-10-CM | POA: Insufficient documentation

## 2019-05-10 DIAGNOSIS — Z79899 Other long term (current) drug therapy: Secondary | ICD-10-CM | POA: Insufficient documentation

## 2019-05-10 DIAGNOSIS — D3501 Benign neoplasm of right adrenal gland: Secondary | ICD-10-CM | POA: Insufficient documentation

## 2019-05-10 DIAGNOSIS — Z7982 Long term (current) use of aspirin: Secondary | ICD-10-CM | POA: Insufficient documentation

## 2019-05-10 DIAGNOSIS — F1410 Cocaine abuse, uncomplicated: Secondary | ICD-10-CM | POA: Diagnosis not present

## 2019-05-10 DIAGNOSIS — I2510 Atherosclerotic heart disease of native coronary artery without angina pectoris: Secondary | ICD-10-CM | POA: Diagnosis not present

## 2019-05-10 DIAGNOSIS — Z7902 Long term (current) use of antithrombotics/antiplatelets: Secondary | ICD-10-CM | POA: Insufficient documentation

## 2019-05-10 DIAGNOSIS — Z7951 Long term (current) use of inhaled steroids: Secondary | ICD-10-CM | POA: Insufficient documentation

## 2019-05-10 LAB — MRSA PCR SCREENING: MRSA by PCR: NEGATIVE

## 2019-05-10 LAB — SARS CORONAVIRUS 2 (TAT 6-24 HRS): SARS Coronavirus 2: NEGATIVE

## 2019-05-10 MED ORDER — SODIUM CHLORIDE 0.9% FLUSH
3.0000 mL | Freq: Two times a day (BID) | INTRAVENOUS | Status: DC
  Administered 2019-05-10 – 2019-05-11 (×4): 3 mL via INTRAVENOUS

## 2019-05-10 MED ORDER — ALTEPLASE 2 MG IJ SOLR: 2.0000 mg | Freq: Once | INTRAMUSCULAR | Status: DC | PRN

## 2019-05-10 MED ORDER — SODIUM CHLORIDE 0.9 % IV SOLN: 100.0000 mL | INTRAVENOUS | Status: DC | PRN

## 2019-05-10 MED ORDER — CALCITRIOL 0.5 MCG PO CAPS
1.0000 ug | ORAL_CAPSULE | ORAL | Status: DC
  Administered 2019-05-12: 1 ug via ORAL

## 2019-05-10 MED ORDER — ACETAMINOPHEN 325 MG PO TABS: 650.0000 mg | ORAL_TABLET | Freq: Four times a day (QID) | ORAL | Status: DC | PRN

## 2019-05-10 MED ORDER — ISOSORBIDE MONONITRATE ER 30 MG PO TB24
30.0000 mg | ORAL_TABLET | Freq: Every day | ORAL | Status: DC
  Administered 2019-05-10 – 2019-05-11 (×2): 30 mg via ORAL
  Filled 2019-05-10 (×2): qty 1

## 2019-05-10 MED ORDER — LIDOCAINE-PRILOCAINE 2.5-2.5 % EX CREA
1.0000 "application " | TOPICAL_CREAM | CUTANEOUS | Status: DC | PRN
  Filled 2019-05-10: qty 5

## 2019-05-10 MED ORDER — ASPIRIN EC 81 MG PO TBEC
81.0000 mg | DELAYED_RELEASE_TABLET | Freq: Every day | ORAL | Status: DC
  Administered 2019-05-11 – 2019-05-12 (×3): 81 mg via ORAL
  Filled 2019-05-10 (×2): qty 1

## 2019-05-10 MED ORDER — HEPARIN SODIUM (PORCINE) 1000 UNIT/ML DIALYSIS
1000.0000 [IU] | INTRAMUSCULAR | Status: DC | PRN
  Filled 2019-05-10: qty 1

## 2019-05-10 MED ORDER — DOCUSATE SODIUM 100 MG PO CAPS: 100.0000 mg | ORAL_CAPSULE | Freq: Two times a day (BID) | ORAL | Status: DC | PRN

## 2019-05-10 MED ORDER — CLOBETASOL PROPIONATE 0.05 % EX CREA
1.0000 "application " | TOPICAL_CREAM | Freq: Every day | CUTANEOUS | Status: DC | PRN
  Filled 2019-05-10: qty 15

## 2019-05-10 MED ORDER — CHLORHEXIDINE GLUCONATE CLOTH 2 % EX PADS
6.0000 | MEDICATED_PAD | Freq: Every day | CUTANEOUS | Status: DC
  Administered 2019-05-11 – 2019-05-12 (×2): 6 via TOPICAL

## 2019-05-10 MED ORDER — CALCITRIOL 0.5 MCG PO CAPS: 1.0000 ug | ORAL_CAPSULE | ORAL | Status: DC

## 2019-05-10 MED ORDER — ALLOPURINOL 100 MG PO TABS
100.0000 mg | ORAL_TABLET | Freq: Every day | ORAL | Status: DC
  Administered 2019-05-11 – 2019-05-12 (×3): 100 mg via ORAL
  Filled 2019-05-10 (×2): qty 1

## 2019-05-10 MED ORDER — BUDESONIDE 0.5 MG/2ML IN SUSP
0.5000 mg | Freq: Two times a day (BID) | RESPIRATORY_TRACT | Status: DC
  Administered 2019-05-10 – 2019-05-12 (×3): 0.5 mg via RESPIRATORY_TRACT
  Filled 2019-05-10 (×6): qty 2

## 2019-05-10 MED ORDER — ARFORMOTEROL TARTRATE 15 MCG/2ML IN NEBU
15.0000 ug | INHALATION_SOLUTION | Freq: Two times a day (BID) | RESPIRATORY_TRACT | Status: DC
  Administered 2019-05-10 – 2019-05-12 (×3): 15 ug via RESPIRATORY_TRACT
  Filled 2019-05-10 (×6): qty 2

## 2019-05-10 MED ORDER — PENTAFLUOROPROP-TETRAFLUOROETH EX AERO
1.0000 "application " | INHALATION_SPRAY | CUTANEOUS | Status: DC | PRN
  Filled 2019-05-10: qty 116

## 2019-05-10 MED ORDER — DIPHENHYDRAMINE HCL 25 MG PO CAPS
50.0000 mg | ORAL_CAPSULE | Freq: Every day | ORAL | Status: DC
  Administered 2019-05-10 – 2019-05-11 (×2): 50 mg via ORAL
  Filled 2019-05-10 (×2): qty 2

## 2019-05-10 MED ORDER — LIDOCAINE HCL (PF) 1 % IJ SOLN: 5.0000 mL | INTRAMUSCULAR | Status: DC | PRN

## 2019-05-10 MED ORDER — IPRATROPIUM-ALBUTEROL 0.5-2.5 (3) MG/3ML IN SOLN: 3.0000 mL | RESPIRATORY_TRACT | Status: DC | PRN

## 2019-05-10 MED ORDER — FERRIC CITRATE 1 GM 210 MG(FE) PO TABS
420.0000 mg | ORAL_TABLET | Freq: Three times a day (TID) | ORAL | Status: DC
  Administered 2019-05-10 – 2019-05-12 (×6): 420 mg via ORAL
  Filled 2019-05-10 (×7): qty 2

## 2019-05-10 MED ORDER — NICOTINE 21 MG/24HR TD PT24
21.0000 mg | MEDICATED_PATCH | Freq: Every day | TRANSDERMAL | Status: DC
  Administered 2019-05-10 – 2019-05-12 (×3): 21 mg via TRANSDERMAL
  Filled 2019-05-10 (×3): qty 1

## 2019-05-10 MED ORDER — SUCRALFATE 1 GM/10ML PO SUSP
1.0000 g | Freq: Four times a day (QID) | ORAL | Status: DC
  Administered 2019-05-10 – 2019-05-12 (×8): 1 g via ORAL
  Filled 2019-05-10 (×10): qty 10

## 2019-05-10 MED ORDER — LIDOCAINE-PRILOCAINE 2.5-2.5 % EX CREA
1.0000 "application " | TOPICAL_CREAM | Freq: Every day | CUTANEOUS | Status: DC | PRN
  Filled 2019-05-10: qty 5

## 2019-05-10 MED ORDER — CLOPIDOGREL BISULFATE 75 MG PO TABS
75.0000 mg | ORAL_TABLET | Freq: Every day | ORAL | Status: DC
  Administered 2019-05-11 – 2019-05-12 (×3): 75 mg via ORAL
  Filled 2019-05-10 (×2): qty 1

## 2019-05-10 MED ORDER — GABAPENTIN 300 MG PO CAPS
300.0000 mg | ORAL_CAPSULE | Freq: Every day | ORAL | Status: DC
  Administered 2019-05-10 – 2019-05-11 (×2): 300 mg via ORAL
  Filled 2019-05-10 (×2): qty 1

## 2019-05-10 MED ORDER — FERRIC CITRATE 1 GM 210 MG(FE) PO TABS: 420.0000 mg | ORAL_TABLET | Freq: Three times a day (TID) | ORAL | Status: DC

## 2019-05-10 MED ORDER — AMLODIPINE BESYLATE 10 MG PO TABS
10.0000 mg | ORAL_TABLET | Freq: Every day | ORAL | Status: DC
  Administered 2019-05-11: 10 mg via ORAL
  Filled 2019-05-10: qty 1

## 2019-05-10 MED ORDER — SENNOSIDES-DOCUSATE SODIUM 8.6-50 MG PO TABS
1.0000 | ORAL_TABLET | Freq: Two times a day (BID) | ORAL | Status: DC
  Administered 2019-05-10 – 2019-05-11 (×4): 1 via ORAL
  Filled 2019-05-10 (×4): qty 1

## 2019-05-10 MED ORDER — METOPROLOL TARTRATE 100 MG PO TABS
100.0000 mg | ORAL_TABLET | Freq: Two times a day (BID) | ORAL | Status: DC
  Administered 2019-05-10 – 2019-05-11 (×3): 100 mg via ORAL
  Filled 2019-05-10 (×3): qty 1

## 2019-05-10 MED ORDER — HEPARIN SODIUM (PORCINE) 5000 UNIT/ML IJ SOLN
5000.0000 [IU] | Freq: Three times a day (TID) | INTRAMUSCULAR | Status: DC
  Administered 2019-05-10 – 2019-05-12 (×6): 5000 [IU] via SUBCUTANEOUS
  Filled 2019-05-10 (×4): qty 1

## 2019-05-10 MED ORDER — PANTOPRAZOLE SODIUM 40 MG PO TBEC
40.0000 mg | DELAYED_RELEASE_TABLET | Freq: Two times a day (BID) | ORAL | Status: DC
  Administered 2019-05-10 – 2019-05-11 (×3): 40 mg via ORAL
  Filled 2019-05-10 (×3): qty 1

## 2019-05-10 MED ORDER — ACETAMINOPHEN 650 MG RE SUPP: 650.0000 mg | Freq: Four times a day (QID) | RECTAL | Status: DC | PRN

## 2019-05-10 MED ORDER — POLYVINYL ALCOHOL 1.4 % OP SOLN
1.0000 [drp] | Freq: Every day | OPHTHALMIC | Status: DC | PRN
  Filled 2019-05-10: qty 15

## 2019-05-10 NOTE — Progress Notes (Signed)
Called back for report. Room ready for admission.

## 2019-05-10 NOTE — Consult Note (Signed)
Reason for Consult: To manage dialysis and dialysis related needs Referring Physician: R Kesa Birky [place] is an 69 y.o. female with past medical history significant for HTN, COPD, a fib, CAD and ESRD-  HD at [facility]- last HD 4/10- which she shortened to 2 hours due to an MD appt ( on Sat? )  She presents as a transfer from [facility] with SOB.  She was noted to have been hospitalized on 3/27 for abdominal pain.  I was told that patient would need HD and then possible discharge but it appears that she is being admitted for PNA/COPD exac-  She is not wearing O2 at present.  She denies other complaints.     Dialyzes at [facility] - 4 hours   EDW 40.5-  Has not been getting there for unclear reasons, BP is high. HD [place] 2/2, Dialyzer 180, Heparin no-  Just in [facility] . Access has AVF-  Said used for first time on Sat-  Still with TDC in place- profile #2 mircera 100 q 2 weeks- calcitriol 1.0 q treatment.  hgb on 4/6 was 9.7.  On 3/16 calc 8.6, phos 6.3 , pth 474  Past Medical History:  Diagnosis Date  . Anemia   . Anxiety   . Arthritis   . Asthma   . Atrial fibrillation ([place])   . Chronic kidney disease    Dialysis T/Th/Sa  started in March 2020  . COPD (chronic obstructive pulmonary disease) ([place])   . Coronary artery disease    2 stents  . Depression   . GERD (gastroesophageal reflux disease)   . Gout   . Headache    migraines  . History of kidney stones   . Hyperlipidemia   . Hypertension   . Pneumonia   . Small bowel obstruction [facility])     Past Surgical History:  Procedure Laterality Date  . ABDOMINAL HYSTERECTOMY    . ABDOMINAL SURGERY     for small bowel obstruction - x 2  . APPENDECTOMY    . AV FISTULA PLACEMENT Left 08/04/2018   Procedure: ARTERIOVENOUS (AV) FISTULA CREATION LEFT ARM;  Surgeon: Waynetta Sandy, MD;  Location: [place];  Service: Vascular;  Laterality: Left;  . BASCILIC VEIN TRANSPOSITION Left 11/24/2018   Procedure: SECOND STAGE  BASILIC VEIN TRANSPOSITION LEFT ARM;  Surgeon: Waynetta Sandy, MD;  Location: [place];  Service: Vascular;  Laterality: Left;  . BIOPSY  03/26/2019   Procedure: BIOPSY;  Surgeon: Lavena Bullion, DO;  Location: [place];  Service: Gastroenterology;;  . CARDIAC CATHETERIZATION    . CORONARY ANGIOPLASTY  ?2003/2004  . ESOPHAGOGASTRODUODENOSCOPY (EGD) WITH PROPOFOL N/A 03/26/2019   Procedure: ESOPHAGOGASTRODUODENOSCOPY (EGD) WITH PROPOFOL;  Surgeon: Lavena Bullion, DO;  Location: [place];  Service: Gastroenterology;  Laterality: N/A;  . FACIAL RECONSTRUCTION SURGERY     x 2  . HERNIA REPAIR    . PERIPHERAL VASCULAR INTERVENTION  04/27/2019   Procedure: PERIPHERAL VASCULAR INTERVENTION;  Surgeon: Marty Heck, MD;  Location: [place] [facility];  Service: Cardiovascular;;  SMA  . VISCERAL ANGIOGRAPHY N/A 04/27/2019   Procedure: MESENTERIC ANGIOGRAPHY;  Surgeon: Marty Heck, MD;  Location: [place] [facility];  Service: Cardiovascular;  Laterality: N/A;    Family History  Problem Relation Age of Onset  . Ulcers Sister        sick a long time, improved after surgery    Social History:  reports that she quit smoking about  2 years ago. She has a 25.00 pack-year smoking history. She has never used smokeless tobacco. She reports previous alcohol use. She reports previous drug use. Drug: Marijuana.  Allergies:  Allergies  Allergen Reactions  . Cyclobenzaprine Anaphylaxis and Other (See Comments)    "stopped heart"   . Morphine Anaphylaxis    "stopped heart"  . Penicillins Shortness Of Breath, Swelling and Palpitations    Did it involve swelling of the face/tongue/throat, SOB, or low BP? Yes Did it involve sudden or severe rash/hives, skin peeling, or any reaction on the inside of your mouth or nose? No Did you need to seek medical attention at a hospital or doctor's office? No When did it last happen?years  If all above answers are "NO", may proceed  with cephalosporin use.   . Ambien [Zolpidem] Other (See Comments)    Severe confusion  . Codeine Itching and Rash  . Hydromorphone Other (See Comments)    If administered quickly, felt like hand was "exploding"     Medications: I have reviewed the patient's current medications.  No results found for this or any previous visit (from the past 48 hour(s)).  No results found.  ROS: previously SOB but now not on O2 able to speak in complete sentences Blood pressure (!) 147/70, pulse 72, temperature 97.8 F (36.6 C), temperature source Oral, resp. rate 17, height 5\' 2"  (1.575 m), weight 43.5 kg, SpO2 98 %. General appearance: alert, cooperative and no distress Resp: diminished breath sounds bibasilar Cardio: irregularly irregular rhythm GI: soft, non-tender; bowel sounds normal; no masses,  no organomegaly Extremities: edema some edema to dep areas and left upper arm AVF seems fragile-  also right sided TDC without tenderness or drainage  non toxic appearing  Assessment/Plan: 69 year old WF-  ESRD-  Presenting with SOB after shortened tx on Sat and not to EDW 1 SOB-  Likely due to missed HD and volume on board-  Lungs with crackles.  Planning for HD today on schedule.  Will also possibly be treated for PNA and COPD exac per the hospitalists 2 ESRD: normally TTS in [place]. Plan for HD today on schedule.  Will use TDC as AVF seems fragile- will let home unit continue to use 3 Hypertension: hypertensive and volume overloaded- HD today -  UF as able 4. Anemia of ESRD: checking hgb-  Has been getting mircera at OP unit.  Await lab to see if needs any anemia specific meds here 5. Metabolic Bone Disease: will continue home calcitriol and also auryxia 2 with meals   Louis Meckel 05/10/2019, 2:37 PM

## 2019-05-10 NOTE — ED Notes (Signed)
Pt given cup and asked to urinate -- states she wasn't able to urinate when she went to the rest room. Will try again at later time.

## 2019-05-10 NOTE — H&P (Signed)
History and Physical    Christina [name] FUX:[id number] DOB: 1950/04/25 DOA: 05/10/2019  Referring MD/NP/PA: Deno Etienne, MD PCP: Maggie Schwalbe, PA-C  Patient coming from: Transfer from [facility] ED to ED  Chief Complaint: Shortness of breath  I have personally briefly reviewed patient's old medical records in [place]   HPI: Christina [name] is a 69 y.o. female with medical history significant of ESRD on HD (TTS), A. Fib, COPD, HTN, HLD, CAD s/p stents, GERD, cocaine abuse, and SBO presented with complaints of shortness of breath to [place] initially on 4/12.  She reports symptoms started the night before with a productive cough.  Normally she is not on supplemental oxygen at home.  Denies having any fever or chills.  Patient reports that she has not smoked cigarettes in over 6 months.  On admission at [facility] she was found to be actively wheezing with O2 saturation reported to be as low as 89% on room air and was placed on 4 L of nasal cannula oxygen.  After patient received DuoNeb breathing treatments she had significant improvement in symptoms, but went into A. fib with heart rates 115-125.  Chest x-ray showing a multifocal pneumonia versus edema.  She was started on a Cardizem drip.  Labs on 4/12 significant for WBC 10.6, hemoglobin 10.8, potassium 3.9, BUN 56, creatinine 5.7, pro BNP 124000, procalcitonin 0.37, and troponin negative x2.  COVID-19 and influenza screening were negative.  Cultures were obtained and patient was started on empiric antibiotics of vancomycin and aztreonam for suspected pneumonia.  Overnight patient converted back into sinus rhythm and was restarted on home meds.  Labs from 4/13 revealed WBC 11.2, hemoglobin 8.8 sodium 135, potassium 4.8, CO2 23, BUN 78, creatinine 6, and anion gap 18.  Patient had just recently been hospitalized from 3/27-4/2 after presenting with complaints of abdominal pain.  EGD revealed gastritis, esophagitis, and multiple  duodenal ulcers.  Vascular surgery has been consulted and has symptoms were thought to be possible to a ischemic cause.  Patient underwent SMA stent placement by vascular surgery currently on aspirin and Plavix.  After getting home reports that her children had been smoking cocaine in her home and she had to clean it up.   ED Course: As seen above  Review of Systems  Constitutional: Positive for malaise/fatigue. Negative for fever.  HENT: Negative for ear pain and nosebleeds.   Eyes: Negative for photophobia and pain.  Respiratory: Positive for cough, shortness of breath and wheezing.   Cardiovascular: Negative for chest pain and leg swelling.  Gastrointestinal: Negative for abdominal pain.  Genitourinary: Negative for dysuria.  Musculoskeletal: Negative for falls.  Skin: Negative for itching.  Neurological: Negative for loss of consciousness.  Psychiatric/Behavioral: Negative for hallucinations.    Past Medical History:  Diagnosis Date  . Anemia   . Anxiety   . Arthritis   . Asthma   . Atrial fibrillation ([place])   . Chronic kidney disease    Dialysis T/Th/Sa  started in March 2020  . COPD (chronic obstructive pulmonary disease) ([place])   . Coronary artery disease    2 stents  . Depression   . GERD (gastroesophageal reflux disease)   . Gout   . Headache    migraines  . History of kidney stones   . Hyperlipidemia   . Hypertension   . Pneumonia   . Small bowel obstruction [facility])     Past Surgical History:  Procedure Laterality Date  . ABDOMINAL HYSTERECTOMY    .  ABDOMINAL SURGERY     for small bowel obstruction - x 2  . APPENDECTOMY    . AV FISTULA PLACEMENT Left 08/04/2018   Procedure: ARTERIOVENOUS (AV) FISTULA CREATION LEFT ARM;  Surgeon: Waynetta Sandy, MD;  Location: [place];  Service: Vascular;  Laterality: Left;  . BASCILIC VEIN TRANSPOSITION Left 11/24/2018   Procedure: SECOND STAGE BASILIC VEIN TRANSPOSITION LEFT ARM;  Surgeon: Waynetta Sandy,  MD;  Location: [place];  Service: Vascular;  Laterality: Left;  . BIOPSY  03/26/2019   Procedure: BIOPSY;  Surgeon: Lavena Bullion, DO;  Location: [place];  Service: Gastroenterology;;  . CARDIAC CATHETERIZATION    . CORONARY ANGIOPLASTY  ?2003/2004  . ESOPHAGOGASTRODUODENOSCOPY (EGD) WITH PROPOFOL N/A 03/26/2019   Procedure: ESOPHAGOGASTRODUODENOSCOPY (EGD) WITH PROPOFOL;  Surgeon: Lavena Bullion, DO;  Location: [place];  Service: Gastroenterology;  Laterality: N/A;  . FACIAL RECONSTRUCTION SURGERY     x 2  . HERNIA REPAIR    . PERIPHERAL VASCULAR INTERVENTION  04/27/2019   Procedure: PERIPHERAL VASCULAR INTERVENTION;  Surgeon: Marty Heck, MD;  Location: [place] CV [facility];  Service: Cardiovascular;;  SMA  . VISCERAL ANGIOGRAPHY N/A 04/27/2019   Procedure: MESENTERIC ANGIOGRAPHY;  Surgeon: Marty Heck, MD;  Location: [place] [facility];  Service: Cardiovascular;  Laterality: N/A;     reports that she quit smoking about 2 years ago. She has a 25.00 pack-year smoking history. She has never used smokeless tobacco. She reports previous alcohol use. She reports previous drug use. Drug: Marijuana.  Allergies  Allergen Reactions  . Cyclobenzaprine Anaphylaxis and Other (See Comments)    "stopped heart"   . Morphine Anaphylaxis    "stopped heart"  . Penicillins Shortness Of Breath, Swelling and Palpitations    Did it involve swelling of the face/tongue/throat, SOB, or low BP? Yes Did it involve sudden or severe rash/hives, skin peeling, or any reaction on the inside of your mouth or nose? No Did you need to seek medical attention at a hospital or doctor's office? No When did it last happen?years  If all above answers are "NO", may proceed with cephalosporin use.   . Ambien [Zolpidem] Other (See Comments)    Severe confusion  . Codeine Itching and Rash  . Hydromorphone Other (See Comments)    If administered quickly, felt like hand was "exploding"      Family History  Problem Relation Age of Onset  . Ulcers Sister        sick a long time, improved after surgery    Prior to Admission medications   Medication Sig Start Date End Date Taking? Authorizing Provider  albuterol (PROVENTIL) (2.5 MG/3ML) 0.083% nebulizer solution Take 2.5 mg by nebulization every 6 (six) hours as needed for wheezing or shortness of breath.    [provider]  allopurinol (ZYLOPRIM) 100 MG tablet Take 100 mg by mouth daily. 02/18/18   [provider]  amLODipine (NORVASC) 10 MG tablet Take 10 mg by mouth at bedtime.     [provider]  aspirin EC 81 MG EC tablet Take 1 tablet (81 mg total) by mouth daily. 04/30/19 05/30/19  Antonieta Pert, MD  budesonide-formoterol (SYMBICORT) 160-4.5 MCG/ACT inhaler Inhale 2 puffs into the lungs every 4 (four) hours as needed (shortness of breath/wheezing).    [provider]  calcitRIOL (ROCALTROL) 0.5 MCG capsule Take 2 capsules (1 mcg total) by mouth Every Tuesday,Thursday,and Saturday with dialysis. 04/30/19 05/30/19  Antonieta Pert, MD  Carboxymethylcellul-Glycerin (LUBRICATING EYE DROPS  OP) Place 1 drop into both eyes daily as needed (dry eyes).    [provider]  Cholecalciferol (VITAMIN D3) 50 MCG (2000 UT) capsule Take 2,000 Units by mouth daily.     [provider]  clobetasol (TEMOVATE) 0.05 % external solution Apply 1 application topically daily as needed (psoriasis).  04/22/19   [provider]  clopidogrel (PLAVIX) 75 MG tablet Take 1 tablet (75 mg total) by mouth daily with breakfast. 04/30/19 05/30/19  Antonieta Pert, MD  dicyclomine (BENTYL) 10 MG capsule Take 20 mg by mouth at bedtime.  08/27/17   [provider]  diphenhydrAMINE (BENADRYL) 25 MG tablet Take 50 mg by mouth at bedtime.    [provider]  docusate sodium (COLACE) 100 MG capsule Take 1 capsule (100 mg total) by mouth 2 (two) times daily. Patient taking differently: Take 100 mg by mouth 2 (two)  times daily as needed (constipation).  08/11/18   Hongalgi, Lenis Dickinson, MD  ferric citrate (AURYXIA) 1 GM 210 MG(Fe) tablet Take 420 mg by mouth See admin instructions. Take 2 tablets (420 mg) by mouth with each meal/snack - up to four times daily    [provider]  gabapentin (NEURONTIN) 300 MG capsule Take 1 capsule (300 mg total) by mouth at bedtime. 02/23/19   Mercy Riding, MD  Ipratropium-Albuterol (COMBIVENT) 20-100 MCG/ACT AERS respimat Inhale 2 puffs into the lungs 4 (four) times daily as needed for wheezing. 09/11/17   [provider]  isosorbide mononitrate (IMDUR) 30 MG 24 hr tablet Take 30 mg by mouth at bedtime.  08/17/18   [provider]  lidocaine-prilocaine (EMLA) cream Apply 1 application topically daily as needed (pain at the site of port access).  09/03/18   [provider]  metoprolol tartrate (LOPRESSOR) 100 MG tablet Take 1 tablet (100 mg total) by mouth 2 (two) times daily. 01/26/19   Little Ishikawa, MD  nitroGLYCERIN (NITROSTAT) 0.4 MG SL tablet Place 0.4 mg under the tongue every 5 (five) minutes as needed for chest pain.  09/18/16   [provider]  ondansetron (ZOFRAN) 4 MG tablet Take 4 mg by mouth 3 (three) times daily as needed for nausea. 09/14/18   [provider]  pantoprazole (PROTONIX) 40 MG tablet Take 1 tablet (40 mg total) by mouth 2 (two) times daily. 04/04/19   Shelly Coss, MD  polyethylene glycol (MIRALAX / GLYCOLAX) 17 g packet Take 17 g by mouth daily as needed for mild constipation. Patient taking differently: Take 17 g by mouth 2 (two) times daily.  03/30/19   Mercy Riding, MD  sennosides-docusate sodium (SENOKOT-S) 8.6-50 MG tablet Take 1 tablet by mouth 2 (two) times daily.     [provider]  sucralfate (CARAFATE) 1 GM/10ML suspension Take 10 mLs (1 g total) by mouth every 6 (six) hours. 03/30/19 05/29/19  Mercy Riding, MD    Physical Exam:  Constitutional: Thin elderly female in no acute  distress Vitals:   05/10/19 1205 05/10/19 1215 05/10/19 1230 05/10/19 1245  BP:  (!) 155/74 (!) 145/75 (!) 152/78  Pulse:  72 79 74  Resp:  (!) 22 (!) 26 18  Temp:      TempSrc:      SpO2:  97% 98% 99%  Weight: 43.5 kg     Height: 5\' 2"  (1.575 m)      Eyes: PERRL, lids and conjunctivae normal ENMT: Mucous membranes are moist. Posterior pharynx clear of any exudate or lesions.Normal  dentition.  Neck: normal, supple, no masses, no thyromegaly Respiratory: clear to auscultation bilaterally, no wheezing, no crackles. Normal respiratory effort. No accessory muscle use.  Cardiovascular: Regular rate and rhythm, no murmurs / rubs / gallops. No extremity edema. 2+ pedal pulses. No carotid bruits.  Abdomen: no tenderness, no masses palpated. No hepatosplenomegaly. Bowel sounds positive.  Musculoskeletal: no clubbing / cyanosis. No joint deformity upper and lower extremities. Good ROM, no contractures. Normal muscle tone.  Skin: no rashes, lesions, ulcers. No induration Neurologic: CN 2-12 grossly intact. Sensation intact, DTR normal. Strength 5/5 in all 4.  Psychiatric: Normal judgment and insight. Alert and oriented x 3. Normal mood.     Labs on Admission: I have personally reviewed following labs and imaging studies  CBC: No results for input(s): WBC, NEUTROABS, HGB, HCT, MCV, PLT in the last 168 hours. Basic Metabolic Panel: No results for input(s): NA, K, CL, CO2, GLUCOSE, BUN, CREATININE, CALCIUM, MG, PHOS in the last 168 hours. GFR: Estimated Creatinine Clearance: 7.5 mL/min (A) (by C-G formula based on SCr of 4.85 mg/dL (H)). Liver Function Tests: No results for input(s): AST, ALT, ALKPHOS, BILITOT, PROT, ALBUMIN in the last 168 hours. No results for input(s): LIPASE, AMYLASE in the last 168 hours. No results for input(s): AMMONIA in the last 168 hours. Coagulation Profile: No results for input(s): INR, PROTIME in the last 168 hours. Cardiac Enzymes: No results for input(s):  CKTOTAL, CKMB, CKMBINDEX, TROPONINI in the last 168 hours. BNP (last 3 results) No results for input(s): PROBNP in the last 8760 hours. HbA1C: No results for input(s): HGBA1C in the last 72 hours. CBG: No results for input(s): GLUCAP in the last 168 hours. Lipid Profile: No results for input(s): CHOL, HDL, LDLCALC, TRIG, CHOLHDL, LDLDIRECT in the last 72 hours. Thyroid Function Tests: No results for input(s): TSH, T4TOTAL, FREET4, T3FREE, THYROIDAB in the last 72 hours. Anemia Panel: No results for input(s): VITAMINB12, FOLATE, FERRITIN, TIBC, IRON, RETICCTPCT in the last 72 hours. Urine analysis:    Component Value Date/Time   COLORURINE YELLOW 04/23/2019 1440   APPEARANCEUR CLEAR 04/23/2019 1440   LABSPEC 1.029 04/23/2019 1440   PHURINE 8.0 04/23/2019 1440   GLUCOSEU NEGATIVE 04/23/2019 1440   HGBUR NEGATIVE 04/23/2019 1440   BILIRUBINUR NEGATIVE 04/23/2019 1440   KETONESUR NEGATIVE 04/23/2019 1440   PROTEINUR >=300 (A) 04/23/2019 1440   NITRITE NEGATIVE 04/23/2019 1440   LEUKOCYTESUR NEGATIVE 04/23/2019 1440   Sepsis Labs: No results found for this or any previous visit (from the past 240 hour(s)).   Radiological Exams on Admission: No results found.  EKG: Independently reviewed.  Normal sinus rhythm at 69 bpm  Assessment/Plan Acute respiratory failure with hypoxia: Patient initially presented to [facility] with complaints of shortness and cough.  Chest x-ray concerning for multifocal pneumonia versus edema.  Patient had initially been started on empiric antibiotics vancomycin and Zosyn.  Procalcitonin was elevated 0.34. -Admit to a medical telemetry bed -Continuous pulse oximetry as needed to maintain O2 saturation greater than 90% -Wean to room air as tolerated  ESRD on HD, volume overload: Patient normally dialyzes Tuesday, Thursday, and Saturday.  Last dialyzed on 4/10.  On physical exam patient does have some crackles.  Patient was noted to have significantly elevated  proBNP at the outside facility.  Suspect aspect of patient being fluid overloaded. -Renal diet with fluid restriction -Continue patient's current medication regimen -Nephrology consulted, we will follow-up for further recommendation  COPD exacerbation: Appears resolved.  Patient without wheezing on physical exam  at this time. Question if symptoms provoked also by recent drug use. -DuoNebs as needed -Symbicort substitution of Brovana and budesonide nebs twice daily   Possible multifocal pneumonia: Patient was noted to have elevated procalcitonin at the outside facility.  She was initially given empiric antibiotics vancomycin and aztreonam.  Seems less likely infectious cause of symptoms at this point in time. -[facility]-up [facility] at [facility] -Will not continue antibotics  Essential hypertension: Home blood pressure medications include metoprolol 100 mg twice daily, amlodipine 10 mg daily, and isosorbide mononitrate 30 mg nightly -Continue current home regimen  Peripheral vascular disease: Patient with recent SMA stenting by vascular surgery. -Continue aspirin and Plavix  Cocaine abuse: Patient with prior history of cocaine abuse.  Reports after getting discharged from hospital and found her kids doing cocaine in the house. -Check urine drug screen if able   GERD, esophagitis, duodenal ulcer: EGD from 03/2018 revealed esophagitis, gastritis, and duodenal ulcers. -Continue Protonix and Carafate  DNR on admission   DVT prophylaxis: Heparin Code Status: DNR Family Communication: Daughter was not available by phone at this time. Disposition Plan: Likely discharge home in 1 to 2 days. Consults called: nephrology  Admission status: Inpatient   Norval Morton MD [facility] Pager [phone]   If 7PM-7AM, please contact night-coverage [email or web address] Password [email or web address]  05/10/2019, 1:13 PM

## 2019-05-10 NOTE — Progress Notes (Signed)
New Admission Note:  Arrival Method: Stretcher Mental Orientation: Alert and oriented x 4 Telemetry: Box 18 NSR Assessment: Completed Skin: Warm dry and scaly IV: NSL Pain: Denies Tubes: N/A Safety Measures: Safety Fall Prevention Plan initiated.  Admission: Completed 5 M  Orientation: Patient has been orientated to the room, unit and the staff. Welcome booklet given.  Family: None  Orders have been reviewed and implemented. Will continue to monitor the patient. Call light has been placed within reach and bed alarm has been activated.   Sima Matas BSN, RN  Phone Number: [phone]

## 2019-05-10 NOTE — ED Triage Notes (Signed)
Pt BIB [facility] from [facility]. Pt came in for shortness of breath. Pt has inhalers but has not used them for a few days. Pt here for dialysis.

## 2019-05-10 NOTE — ED Provider Notes (Signed)
[name] EMERG[facility] DE[facility]MENT Provider Note   CSN: [id number] Arrival date & time: 05/10/19  1154     History Chief Complaint  Patient presents with  . Dialysis    Christina [name] is a 69 y.o. female.  69 yo F with a chief complaints of shortness of breath.  The patient was seen at an outside hospital and diagnosed with a COPD exacerbation.  Had some improvement with breathing treatments.  This went into A. fib with RVR and then later spontaneously converted.  She went out into the 80s with minimal exertion this morning and so there was concern for acute pulmonary edema.  Patient is on dialysis and seen Tuesday Thursday and Saturday.  She had been pending admission for the past 20 hours and so the ED doctor had called over here so the patient to be transferred and have dialysis performed.  Upon arrival here the patient says she feels fine.  Feels that her breathing is much better.  The history is provided by the patient and medical records (Referring emergency Dr.).       Past Medical History:  Diagnosis Date  . Anemia   . Anxiety   . Arthritis   . Asthma   . Atrial fibrillation ([place])   . Chronic kidney disease    Dialysis T/Th/Sa  started in March 2020  . COPD (chronic obstructive pulmonary disease) ([place])   . Coronary artery disease    2 stents  . Depression   . GERD (gastroesophageal reflux disease)   . Gout   . Headache    migraines  . History of kidney stones   . Hyperlipidemia   . Hypertension   . Pneumonia   . Small bowel obstruction [facility])     Patient Active Problem List   Diagnosis Date Noted  . Respiratory failure with hypoxia ([place]) 05/10/2019  . 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 Duodenal ulcer perforation ([place]) 04/23/2019  . Cocaine abuse ([place]) 04/23/2019  . Acute gastric ulcer without hemorrhage or perforation   . Hematemesis 04/04/2019  . Atrial fibrillation with RVR ([place])   . Duodenal ulcer   . Gastritis  and gastroduodenitis   . Acute blood loss anemia   . 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 RUQ pain   . Anemia due to chronic kidney disease, on chronic dialysis ([place])   . AF (paroxysmal atrial fibrillation) ([place])   . Chest pain 03/19/2019  . Hyperkalemia 02/28/2019  . Uremia 02/27/2019  . Volume overload 02/21/2019  . Abdominal pain 02/21/2019  . Non-compliance with renal dialysis ([place]) 02/21/2019  . Respiratory failure ([place]) 02/04/2019  . Acute and chr resp failure, unsp w hypoxia or hypercapnia ([place])   . Afib ([employer]) 01/25/2019  . SBO (small bowel obstruction) ([place]) 08/10/2018  . Anemia due to chronic blood loss   . Heme positive stool   . Platelet inhibition due to Plavix   . Acute GI bleeding 05/06/2018  . ESRD (end stage renal disease) ([place]) 05/06/2018  . Anxiety 05/06/2018  . Bipolar affective ([place]) 05/06/2018  . CAD (coronary artery disease), native coronary artery 05/06/2018  . COPD with chronic bronchitis ([place]) 05/06/2018  . CVA (cerebral vascular accident) ([place]) 05/06/2018  . Essential hypertension 05/06/2018  . Hyperlipidemia 05/06/2018    Past Surgical History:  Procedure Laterality Date  . ABDOMINAL HYSTERECTOMY    . ABDOMINAL SURGERY     for small bowel obstruction - x 2  . APPENDECTOMY    .  AV FISTULA PLACEMENT Left 08/04/2018   Procedure: ARTERIOVENOUS (AV) FISTULA CREATION LEFT ARM;  Surgeon: Waynetta Sandy, MD;  Location: [place];  Service: Vascular;  Laterality: Left;  . BASCILIC VEIN TRANSPOSITION Left 11/24/2018   Procedure: SECOND STAGE BASILIC VEIN TRANSPOSITION LEFT ARM;  Surgeon: Waynetta Sandy, MD;  Location: [place];  Service: Vascular;  Laterality: Left;  . BIOPSY  03/26/2019   Procedure: BIOPSY;  Surgeon: Lavena Bullion, DO;  Location: [place];  Service: Gastroenterology;;  . CARDIAC CATHETERIZATION    . CORONARY ANGIOPLASTY  ?2003/2004  . ESOPHAGOGASTRODUODENOSCOPY (EGD) WITH PROPOFOL N/A 03/26/2019   Procedure:  ESOPHAGOGASTRODUODENOSCOPY (EGD) WITH PROPOFOL;  Surgeon: Lavena Bullion, DO;  Location: [place];  Service: Gastroenterology;  Laterality: N/A;  . FACIAL RECONSTRUCTION SURGERY     x 2  . HERNIA REPAIR    . PERIPHERAL VASCULAR INTERVENTION  04/27/2019   Procedure: PERIPHERAL VASCULAR INTERVENTION;  Surgeon: Marty Heck, MD;  Location: [facility];  Service: Cardiovascular;;  SMA  . VISCERAL ANGIOGRAPHY N/A 04/27/2019   Procedure: MESENTERIC ANGIOGRAPHY;  Surgeon: Marty Heck, MD;  Location: [facility] [facility];  Service: Cardiovascular;  Laterality: N/A;     OB History   No obstetric history on file.     Family History  Problem Relation Age of Onset  . Ulcers Sister        sick a long time, improved after surgery    Social History   Tobacco Use  . Smoking status: Former Smoker    Packs/day: 0.50    Years: 50.00    Pack years: 25.00    Quit date: 2019    Years since quitting: 2.2  . Smokeless tobacco: Never Used  Substance Use Topics  . Alcohol use: Not Currently    Comment: quit 2019 - "I was a drunk"  . Drug use: Not Currently    Types: Marijuana    Comment: h/o drug use - "I was raised in the 60s"; last use 18 months ago; smokes marijuana periodically    Home Medications Prior to Admission medications   Medication Sig Start Date End Date Taking? Authorizing Provider  albuterol (PROVENTIL) (2.5 MG/3ML) 0.083% nebulizer solution Take 2.5 mg by nebulization every 6 (six) hours as needed for wheezing or shortness of breath.   Yes [provider]  allopurinol (ZYLOPRIM) 100 MG tablet Take 100 mg by mouth daily. 02/18/18  Yes [provider]  amLODipine (NORVASC) 10 MG tablet Take 10 mg by mouth at bedtime.    Yes [provider]  aspirin EC 81 MG EC tablet Take 1 tablet (81 mg total) by mouth daily. 04/30/19 05/30/19 Yes Antonieta Pert, MD  budesonide-formoterol (SYMBICORT) 160-4.5 MCG/ACT inhaler Inhale 2 puffs into the  lungs every 4 (four) hours as needed (shortness of breath/wheezing).   Yes [provider]  calcitRIOL (ROCALTROL) 0.5 MCG capsule Take 2 capsules (1 mcg total) by mouth Every Tuesday,Thursday,and Saturday with dialysis. 04/30/19 05/30/19 Yes Antonieta Pert, MD  Cholecalciferol (VITAMIN D3) 50 MCG (2000 UT) capsule Take 2,000 Units by mouth daily.    Yes [provider]  clobetasol (TEMOVATE) 0.05 % external solution Apply 1 application topically daily as needed (psoriasis).  04/22/19  Yes [provider]  clopidogrel (PLAVIX) 75 MG tablet Take 1 tablet (75 mg total) by mouth daily with breakfast. 04/30/19 05/30/19 Yes Kc, Maren Beach, MD  dicyclomine (BENTYL) 10 MG capsule Take 20 mg by mouth at bedtime.  08/27/17  Yes  [provider]  diphenhydrAMINE (BENADRYL) 25 MG tablet Take 50 mg by mouth at bedtime.   Yes [provider]  docusate sodium (COLACE) 100 MG capsule Take 1 capsule (100 mg total) by mouth 2 (two) times daily. Patient taking differently: Take 300 mg by mouth daily as needed (constipation).  08/11/18  Yes Hongalgi, Lenis Dickinson, MD  ferric citrate (AURYXIA) 1 GM 210 MG(Fe) tablet Take 420 mg by mouth daily.    Yes [provider]  gabapentin (NEURONTIN) 300 MG capsule Take 1 capsule (300 mg total) by mouth at bedtime. 02/23/19  Yes Mercy Riding, MD  Ipratropium-Albuterol (COMBIVENT) 20-100 MCG/ACT AERS respimat Inhale 2 puffs into the lungs 4 (four) times daily as needed for wheezing. 09/11/17  Yes [provider]  isosorbide mononitrate (IMDUR) 30 MG 24 hr tablet Take 30 mg by mouth at bedtime.  08/17/18  Yes [provider]  lidocaine-prilocaine (EMLA) cream Apply 1 application topically daily as needed (pain at the site of port access).  09/03/18  Yes [provider]  metoprolol tartrate (LOPRESSOR) 100 MG tablet Take 1 tablet (100 mg total) by mouth 2 (two) times daily. 01/26/19  Yes Little Ishikawa, MD  nitroGLYCERIN  (NITROSTAT) 0.4 MG SL tablet Place 0.4 mg under the tongue every 5 (five) minutes as needed for chest pain.  09/18/16  Yes [provider]  ondansetron (ZOFRAN) 4 MG tablet Take 4 mg by mouth 3 (three) times daily as needed for nausea. 09/14/18  Yes [provider]  pantoprazole (PROTONIX) 40 MG tablet Take 1 tablet (40 mg total) by mouth 2 (two) times daily. 04/04/19  Yes Shelly Coss, MD  polyethylene glycol (MIRALAX / GLYCOLAX) 17 g packet Take 17 g by mouth daily as needed for mild constipation. Patient taking differently: Take 17 g by mouth 2 (two) times daily as needed for mild constipation.  03/30/19  Yes Mercy Riding, MD  sennosides-docusate sodium (SENOKOT-S) 8.6-50 MG tablet Take 1 tablet by mouth as needed for constipation.    Yes [provider]  sucralfate (CARAFATE) 1 GM/10ML suspension Take 10 mLs (1 g total) by mouth every 6 (six) hours. 03/30/19 05/29/19 Yes Mercy Riding, MD    Allergies    Cyclobenzaprine, Morphine, Penicillins, Ambien [zolpidem], Codeine, and Hydromorphone  Review of Systems   Review of Systems  Constitutional: Negative for chills and fever.  HENT: Negative for congestion and rhinorrhea.   Eyes: Negative for redness and visual disturbance.  Respiratory: Positive for cough and shortness of breath. Negative for wheezing.   Cardiovascular: Negative for chest pain and palpitations.  Gastrointestinal: Negative for nausea and vomiting.  Genitourinary: Negative for dysuria and urgency.  Musculoskeletal: Negative for arthralgias and myalgias.  Skin: Negative for pallor and wound.  Neurological: Negative for dizziness and headaches.    Physical Exam Updated Vital Signs BP (!) 147/70   Pulse 72   Temp 97.8 F (36.6 C) (Oral)   Resp 17   Ht 5\' 2"  (1.575 m)   Wt 43.5 kg   SpO2 98%   BMI 17.56 kg/m   Physical Exam Vitals and nursing note reviewed.  Constitutional:      General: She is not in acute distress.    Appearance: She is  well-developed. She is not diaphoretic.  HENT:     Head: Normocephalic and atraumatic.  Eyes:     Pupils: Pupils are equal, round, and reactive to light.  Neck:     Comments: jvd to mid  neck  Cardiovascular:     Rate and Rhythm: Normal rate and regular rhythm.     Heart sounds: No murmur. No friction rub. No gallop.   Pulmonary:     Effort: Pulmonary effort is normal.     Breath sounds: Rales (up to the mid back) present. No wheezing.  Abdominal:     General: There is no distension.     Palpations: Abdomen is soft.     Tenderness: There is no abdominal tenderness.  Musculoskeletal:        General: No tenderness.     Cervical back: Normal range of motion and neck supple.  Skin:    General: Skin is warm and dry.  Neurological:     Mental Status: She is alert and oriented to person, place, and time.  Psychiatric:        Behavior: Behavior normal.     ED Results / Procedures / Treatments   Labs (all labs ordered are listed, but only abnormal results are displayed) Labs Reviewed  HIV ANTIBODY (ROUTINE TESTING W REFLEX)  RAPID URINE DRUG SCREEN, HOSP PERFORMED    EKG None  Radiology No results found.  Procedures Procedures (including critical care time)  Medications Ordered in ED Medications  aspirin EC tablet 81 mg (has no administration in time range)  clopidogrel (PLAVIX) tablet 75 mg (has no administration in time range)  heparin injection 5,000 Units (5,000 Units Subcutaneous Given 05/10/19 1350)  sodium chloride flush (NS) 0.9 % injection 3 mL (has no administration in time range)  acetaminophen (TYLENOL) tablet 650 mg (has no administration in time range)    Or  acetaminophen (TYLENOL) suppository 650 mg (has no administration in time range)  allopurinol (ZYLOPRIM) tablet 100 mg (has no administration in time range)  amLODipine (NORVASC) tablet 10 mg (has no administration in time range)  metoprolol tartrate (LOPRESSOR) tablet 100 mg (has no administration in  time range)  isosorbide mononitrate (IMDUR) 24 hr tablet 30 mg (has no administration in time range)  calcitRIOL (ROCALTROL) capsule 1 mcg (has no administration in time range)  docusate sodium (COLACE) capsule 100 mg (has no administration in time range)  ferric citrate (AURYXIA) tablet 420 mg (has no administration in time range)  senna-docusate (Senokot-S) tablet 1 tablet (has no administration in time range)  sucralfate (CARAFATE) 1 GM/10ML suspension 1 g (has no administration in time range)  pantoprazole (PROTONIX) EC tablet 40 mg (has no administration in time range)  gabapentin (NEURONTIN) capsule 300 mg (has no administration in time range)  budesonide (PULMICORT) nebulizer solution 0.5 mg (has no administration in time range)  arformoterol (BROVANA) nebulizer solution 15 mcg (has no administration in time range)  ipratropium-albuterol (DUONEB) 0.5-2.5 (3) MG/3ML nebulizer solution 3 mL (has no administration in time range)  diphenhydrAMINE (BENADRYL) capsule 50 mg (has no administration in time range)  polyvinyl alcohol (LIQUIFILM TEARS) 1.4 % ophthalmic solution 1 drop (has no administration in time range)  clobetasol cream (TEMOVATE) 2.53 % 1 application (has no administration in time range)  lidocaine-prilocaine (EMLA) cream 1 application (has no administration in time range)  nicotine (NICODERM CQ - dosed in mg/24 hours) patch 21 mg (has no administration in time range)    ED Course  I have reviewed the triage vital signs and the nursing notes.  Pertinent labs & imaging results that were available during my care of the patient were reviewed by me and considered in my medical decision making (see chart for details).    MDM  Rules/Calculators/A&P                      69 yo F with a chief complaints of cough and shortness of breath.  Seen in outside hospital and had been found to likely have a COPD exacerbation.  Due to her being on dialysis she was admitted to this facility  unfortunately there was not a bed available for her and she was held at that facility for over 20 hours.  The patient had significant worsening of their work of breathing as per the provider that I discussed her case with on the phone.  They felt that she was now fluid overloaded and with her history of end-stage renal disease and were concerned that she needed to come here for dialysis.  Based on their report she was still requiring oxygen even though her COPD was apparently much better.  On exam here the patient is well-appearing and nontoxic she has rales at the bases and JVD to mid neck.  She arrived on 2 L of oxygen in no acute distress.  Reportedly her potassium was normal at 4.8 and her BUN was 78.  Will discuss with the nephrologist.  CRITICAL CARE Performed by: Cecilio Asper   Total critical care time: 35 minutes  Critical care time was exclusive of separately billable procedures and treating other patients.  Critical care was necessary to treat or prevent imminent or life-threatening deterioration.  Critical care was time spent personally by me on the following activities: development of treatment plan with patient and/or surrogate as well as nursing, discussions with consultants, evaluation of patient's response to treatment, examination of patient, obtaining history from patient or surrogate, ordering and performing treatments and interventions, ordering and review of laboratory studies, ordering and review of radiographic studies, pulse oximetry and re-evaluation of patient's condition.  The patients results and plan were reviewed and discussed.   Any x-rays performed were independently reviewed by myself.   Differential diagnosis were considered with the presenting HPI.  Medications  aspirin EC tablet 81 mg (has no administration in time range)  clopidogrel (PLAVIX) tablet 75 mg (has no administration in time range)  heparin injection 5,000 Units (5,000 Units Subcutaneous  Given 05/10/19 1350)  sodium chloride flush (NS) 0.9 % injection 3 mL (has no administration in time range)  acetaminophen (TYLENOL) tablet 650 mg (has no administration in time range)    Or  acetaminophen (TYLENOL) suppository 650 mg (has no administration in time range)  allopurinol (ZYLOPRIM) tablet 100 mg (has no administration in time range)  amLODipine (NORVASC) tablet 10 mg (has no administration in time range)  metoprolol tartrate (LOPRESSOR) tablet 100 mg (has no administration in time range)  isosorbide mononitrate (IMDUR) 24 hr tablet 30 mg (has no administration in time range)  calcitRIOL (ROCALTROL) capsule 1 mcg (has no administration in time range)  docusate sodium (COLACE) capsule 100 mg (has no administration in time range)  ferric citrate (AURYXIA) tablet 420 mg (has no administration in time range)  senna-docusate (Senokot-S) tablet 1 tablet (has no administration in time range)  sucralfate (CARAFATE) 1 GM/10ML suspension 1 g (has no administration in time range)  pantoprazole (PROTONIX) EC tablet 40 mg (has no administration in time range)  gabapentin (NEURONTIN) capsule 300 mg (has no administration in time range)  budesonide (PULMICORT) nebulizer solution 0.5 mg (has no administration in time range)  arformoterol (BROVANA) nebulizer solution 15 mcg (has no administration in time range)  ipratropium-albuterol (DUONEB) 0.5-2.5 (  3) MG/3ML nebulizer solution 3 mL (has no administration in time range)  diphenhydrAMINE (BENADRYL) capsule 50 mg (has no administration in time range)  polyvinyl alcohol (LIQUIFILM TEARS) 1.4 % ophthalmic solution 1 drop (has no administration in time range)  clobetasol cream (TEMOVATE) 1.00 % 1 application (has no administration in time range)  lidocaine-prilocaine (EMLA) cream 1 application (has no administration in time range)  nicotine (NICODERM CQ - dosed in mg/24 hours) patch 21 mg (has no administration in time range)    Vitals:   05/10/19  1230 05/10/19 1245 05/10/19 1300 05/10/19 1315  BP: (!) 145/75 (!) 152/78 140/70 (!) 147/70  Pulse: 79 74 72 72  Resp: (!) 26 18 (!) 21 17  Temp:      TempSrc:      SpO2: 98% 99% 98% 98%  Weight:      Height:        Final diagnoses:  Acute respiratory failure with hypoxia (HCC)  Acute pulmonary edema (HCC)    Admission/ observation were discussed with the admitting physician, patient and/or family and they are comfortable with the plan.   Final Clinical Impression(s) / ED Diagnoses Final diagnoses:  Acute respiratory failure with hypoxia ([place])  Acute pulmonary edema [facility])    Rx / DC Orders ED Discharge Orders    None       Deno Etienne, DO 05/10/19 1446

## 2019-05-10 NOTE — Progress Notes (Signed)
Notified RN Minette Brine at [phone] that HD treatment will be 05/11/2019 in the morning, per DR Moshe Cipro

## 2019-05-11 ENCOUNTER — Inpatient Hospital Stay (HOSPITAL_COMMUNITY): Payer: Medicare Other

## 2019-05-11 DIAGNOSIS — I10 Essential (primary) hypertension: Secondary | ICD-10-CM | POA: Diagnosis not present

## 2019-05-11 DIAGNOSIS — J449 Chronic obstructive pulmonary disease, unspecified: Secondary | ICD-10-CM | POA: Diagnosis not present

## 2019-05-11 DIAGNOSIS — E8770 Fluid overload, unspecified: Secondary | ICD-10-CM | POA: Diagnosis not present

## 2019-05-11 DIAGNOSIS — J9601 Acute respiratory failure with hypoxia: Secondary | ICD-10-CM | POA: Diagnosis not present

## 2019-05-11 DIAGNOSIS — N186 End stage renal disease: Secondary | ICD-10-CM | POA: Diagnosis not present

## 2019-05-11 LAB — CBC
HCT: 25.5 % — ABNORMAL LOW (ref 36.0–46.0)
Hemoglobin: 7.8 g/dL — ABNORMAL LOW (ref 12.0–15.0)
MCH: 26.1 pg (ref 26.0–34.0)
MCHC: 30.6 g/dL (ref 30.0–36.0)
MCV: 85.3 fL (ref 80.0–100.0)
Platelets: 278 10*3/uL (ref 150–400)
RBC: 2.99 MIL/uL — ABNORMAL LOW (ref 3.87–5.11)
RDW: 16.9 % — ABNORMAL HIGH (ref 11.5–15.5)
WBC: 12.4 10*3/uL — ABNORMAL HIGH (ref 4.0–10.5)
nRBC: 0 % (ref 0.0–0.2)

## 2019-05-11 LAB — RENAL FUNCTION PANEL
Albumin: 2.2 g/dL — ABNORMAL LOW (ref 3.5–5.0)
Anion gap: 14 (ref 5–15)
BUN: 91 mg/dL — ABNORMAL HIGH (ref 8–23)
CO2: 20 mmol/L — ABNORMAL LOW (ref 22–32)
Calcium: 8.6 mg/dL — ABNORMAL LOW (ref 8.9–10.3)
Chloride: 101 mmol/L (ref 98–111)
Creatinine, Ser: 6.32 mg/dL — ABNORMAL HIGH (ref 0.44–1.00)
GFR calc Af Amer: 7 mL/min — ABNORMAL LOW (ref >60)
GFR calc non Af Amer: 6 mL/min — ABNORMAL LOW (ref >60)
Glucose, Bld: 119 mg/dL — ABNORMAL HIGH (ref 70–99)
Phosphorus: 5.3 mg/dL — ABNORMAL HIGH (ref 2.5–4.6)
Potassium: 5.2 mmol/L — ABNORMAL HIGH (ref 3.5–5.1)
Sodium: 135 mmol/L (ref 135–145)

## 2019-05-11 LAB — HIV ANTIBODY (ROUTINE TESTING W REFLEX): HIV Screen 4th Generation wRfx: NONREACTIVE

## 2019-05-11 MED ORDER — DARBEPOETIN ALFA 40 MCG/0.4ML IJ SOSY: 40.0000 ug | PREFILLED_SYRINGE | Freq: Once | INTRAMUSCULAR | Status: AC

## 2019-05-11 MED ORDER — DARBEPOETIN ALFA 40 MCG/0.4ML IJ SOSY
PREFILLED_SYRINGE | INTRAMUSCULAR | Status: AC
  Administered 2019-05-11: 40 ug via INTRAVENOUS
  Filled 2019-05-11: qty 0.4

## 2019-05-11 MED ORDER — HEPARIN SODIUM (PORCINE) 1000 UNIT/ML IJ SOLN
INTRAMUSCULAR | Status: AC
  Administered 2019-05-11: 3200 [IU] via INTRAVENOUS_CENTRAL
  Filled 2019-05-11: qty 4

## 2019-05-11 NOTE — Progress Notes (Signed)
PROGRESS NOTE  Christina [name]:[id number] DOB: 09-26-1950 DOA: 05/10/2019 PCP: Maggie Schwalbe, PA-C  HPI/Recap of past 24 hours: HPI from Dr Derrek Gu is a 69 y.o. female with medical history significant of ESRD on HD (TTS), A. Fib, COPD, HTN, HLD, CAD s/p stents, GERD, cocaine abuse, and SBO presented with complaints of shortness of breath to [place] initially on 4/12. Patient reports that she has not smoked cigarettes in over 6 months. On admission at [facility] she was found to be actively wheezing with O2 saturation reported to be as low as 89% on room air and was placed on 4 L of nasal cannula oxygen.  After patient received DuoNeb breathing treatments she had significant improvement in symptoms, but went into A. fib with heart rates 115-125.  Chest x-ray showing a multifocal pneumonia versus edema.  She was started on a Cardizem drip.  Labs on 4/12 significant for WBC 10.6, hemoglobin 10.8, potassium 3.9, BUN 56, creatinine 5.7, pro BNP 124000, procalcitonin 0.37, and troponin negative x2.  COVID-19 and influenza screening were negative.  Cultures were obtained and patient was started on empiric antibiotics of vancomycin and aztreonam for suspected pneumonia. The next day, patient converted back into sinus rhythm and was restarted on home meds. Labs from 4/13 revealed WBC 11.2, hemoglobin 8.8 sodium 135, potassium 4.8, CO2 23, BUN 78, creatinine 6, and anion gap 18. Patient had just recently been hospitalized from 3/27-4/2 after presenting with complaints of abdominal pain.  EGD revealed gastritis, esophagitis, and multiple duodenal ulcers.  Vascular surgery has been consulted and has symptoms were thought to be possible to a ischemic cause. Patient underwent SMA stent placement by vascular surgery currently on aspirin and Plavix.  Patient admitted for further management.     Today, met patient during dialysis, reports breathing has improved, denies any chest pain, abdominal  pain, nausea/vomiting, fever/chills.      Assessment/Plan: Principal Problem:   Respiratory failure with hypoxia (HCC) Active Problems:   ESRD (end stage renal disease) ([place])   COPD with chronic bronchitis (HCC)   Essential hypertension   Volume overload   Gastritis and gastroduodenitis   Pneumonia   Acute respiratory failure with hypoxia likely 2/2 volume overload Vs HCAP/COPD exacerbation Currently saturating well on room air, afebrile with leukocytosis BC X2 at [facility] showed no growth Procalcitonin elevated 0.34, will trend Initially started on vancomycin and aztreonam, discontinued, will repeat chest x-ray today and reevaluate for possible antibiotic use Continue duo nebs, inhalers Supplemental oxygen as needed  ESRD on HD Presents with volume overload due to missed dialysis Nephrology on board, had dialysis on 05/11/2019, plan for another HD session on 05/12/2019  Hyperkalemia Managed by HD Daily BMP  Anemia of CKD Management per nephrology Daily CBC  Hypertension Continue home regimen  Peripheral vascular disease Recent SMA stenting by vascular surgery Continue aspirin, Plavix  GERD/esophagitis/duodenal ulcer EGD from 03/2018 showed above Continue Protonix, Carafate  Polysubstance abuse History of cocaine abuse Advised to abstain         Malnutrition Type:      Malnutrition Characteristics:      Nutrition Interventions:       Estimated body mass index is 17.34 kg/m as calculated from the following:   Height as of this encounter: _0  (1.575 m).   Weight as of this encounter: 43 kg.     Code Status: DNR  Family Communication: Discussed with patient extensively  Disposition Plan: Likely DC home on 05/12/2019  Consultants:  Nephrology  Procedures:  None  Antimicrobials:  None  DVT prophylaxis: Heparin   Objective: Vitals:   05/11/19 1030 05/11/19 1100 05/11/19 1126 05/11/19 1218  BP: (!) 183/76 (!) 161/57 (!)  170/79 (!) 183/64  Pulse: 79 81 80 81  Resp:   16 18  Temp:   97.9 F (36.6 C) 98.6 F (37 C)  TempSrc:   Oral Oral  SpO2:   97% 98%  Weight:   43 kg   Height:        Intake/Output Summary (Last 24 hours) at 05/11/2019 1227 Last data filed at 05/11/2019 1126 Gross per 24 hour  Intake 480 ml  Output 4000 ml  Net -3520 ml   Filed Weights   05/10/19 2031 05/11/19 0715 05/11/19 1126  Weight: 46.1 kg 46.7 kg 43 kg    Exam:  General: NAD   Cardiovascular: S1, S2 present  Respiratory:  Diminished breath sounds bilaterally  Abdomen: Soft, nontender, nondistended, bowel sounds present  Musculoskeletal: No bilateral pedal edema noted  Skin: Normal  Psychiatry: Normal mood    Data Reviewed: CBC: Recent Labs  Lab 05/11/19 0441  WBC 12.4*  HGB 7.8*  HCT 25.5*  MCV 85.3  PLT 443   Basic Metabolic Panel: Recent Labs  Lab 05/11/19 0441  NA 135  K 5.2*  CL 101  CO2 20*  GLUCOSE 119*  BUN 91*  CREATININE 6.32*  CALCIUM 8.6*  PHOS 5.3*   GFR: Estimated Creatinine Clearance: 5.7 mL/min (A) (by C-G formula based on SCr of 6.32 mg/dL (H)). Liver Function Tests: Recent Labs  Lab 05/11/19 0441  ALBUMIN 2.2*   No results for input(s): LIPASE, AMYLASE in the last 168 hours. No results for input(s): AMMONIA in the last 168 hours. Coagulation Profile: No results for input(s): INR, PROTIME in the last 168 hours. Cardiac Enzymes: No results for input(s): CKTOTAL, CKMB, CKMBINDEX, TROPONINI in the last 168 hours. BNP (last 3 results) No results for input(s): PROBNP in the last 8760 hours. HbA1C: No results for input(s): HGBA1C in the last 72 hours. CBG: No results for input(s): GLUCAP in the last 168 hours. Lipid Profile: No results for input(s): CHOL, HDL, LDLCALC, TRIG, CHOLHDL, LDLDIRECT in the last 72 hours. Thyroid Function Tests: No results for input(s): TSH, T4TOTAL, FREET4, T3FREE, THYROIDAB in the last 72 hours. Anemia Panel: No results for  input(s): VITAMINB12, FOLATE, FERRITIN, TIBC, IRON, RETICCTPCT in the last 72 hours. Urine analysis:    Component Value Date/Time   COLORURINE YELLOW 04/23/2019 1440   APPEARANCEUR CLEAR 04/23/2019 1440   LABSPEC 1.029 04/23/2019 1440   PHURINE 8.0 04/23/2019 1440   GLUCOSEU NEGATIVE 04/23/2019 1440   HGBUR NEGATIVE 04/23/2019 1440   BILIRUBINUR NEGATIVE 04/23/2019 1440   KETONESUR NEGATIVE 04/23/2019 1440   PROTEINUR >=300 (A) 04/23/2019 1440   NITRITE NEGATIVE 04/23/2019 1440   LEUKOCYTESUR NEGATIVE 04/23/2019 1440   Sepsis Labs: _0 (procalcitonin:4,lacticidven:4)  ) Recent Results (from the past 240 hour(s))  SARS CORONAVIRUS 2 (TAT 6-24 HRS) Nasopharyngeal Nasopharyngeal Swab     Status: None   Collection Time: 05/10/19  6:30 PM   Specimen: Nasopharyngeal Swab  Result Value Ref Range Status   SARS Coronavirus 2 NEGATIVE NEGATIVE Final    Comment: (NOTE) SARS-CoV-2 target nucleic acids are NOT DETECTED. The SARS-CoV-2 RNA is generally detectable in upper and lower respiratory specimens during the acute phase of infection. Negative results do not preclude SARS-CoV-2 infection, do not rule out co-infections with other pathogens, and should not be  used as the sole basis for treatment or other patient management decisions. Negative results must be combined with clinical observations, patient history, and epidemiological information. The expected result is Negative. Fact Sheet for Patients: SugarRoll.be Fact Sheet for Healthcare Providers: https://www.woods-mathews.com/ This test is not yet approved or cleared by the [place] FDA and  has been authorized for detection and/or diagnosis of SARS-CoV-2 by FDA under an Emergency Use Authorization (EUA). This EUA will remain  in effect (meaning this test can be used) for the duration of the COVID-19 declaration under Section 56 4(b)(1) of the Act, 21 U.S.C. section  360bbb-3(b)(1), unless the authorization is terminated or revoked sooner. Performed at [facility], [place] [address] [place], [place] [address]   MRSA PCR Screening     Status: None   Collection Time: 05/10/19  8:32 PM   Specimen: Nasopharyngeal  Result Value Ref Range Status   MRSA by PCR NEGATIVE NEGATIVE Final    Comment:        The GeneXpert MRSA Assay (FDA approved for NASAL specimens only), is one component of a comprehensive MRSA colonization surveillance program. It is not intended to diagnose MRSA infection nor to guide or monitor treatment for MRSA infections. Performed at [facility], [place] [address] [place], [place] [address]       Studies: No results found.  Scheduled Meds: . allopurinol  100 mg Oral Daily  . amLODipine  10 mg Oral QHS  . arformoterol  15 mcg Nebulization BID  . aspirin EC  81 mg Oral Daily  . budesonide (PULMICORT) nebulizer solution  0.5 mg Nebulization BID  . [START ON 05/12/2019] calcitRIOL  1 mcg Oral Q T,Th,Sa-HD  . Chlorhexidine Gluconate Cloth  6 each Topical Q0600  . clopidogrel  75 mg Oral Q breakfast  . diphenhydrAMINE  50 mg Oral QHS  . ferric citrate  420 mg Oral TID WC  . gabapentin  300 mg Oral QHS  . heparin  5,000 Units Subcutaneous Q8H  . isosorbide mononitrate  30 mg Oral QHS  . metoprolol tartrate  100 mg Oral BID  . nicotine  21 mg Transdermal Daily  . pantoprazole  40 mg Oral BID  . senna-docusate  1 tablet Oral BID  . sodium chloride flush  3 mL Intravenous Q12H  . sucralfate  1 g Oral Q6H    Continuous Infusions:   LOS: 1 day     Alma Friendly, MD [facility]  If 7PM-7AM, please contact night-coverage [email or web address] 05/11/2019, 12:27 PM

## 2019-05-11 NOTE — TOC Initial Note (Signed)
Transition of Care [facility]) - Initial/Assessment Note    Patient Details  Name: Christina [name] MRN: [id number] Date of Birth: 06-05-1950  [facility]) CM/SW Contact:    Bartholomew Crews, RN Phone Number: [phone] 05/11/2019, 4:43 PM  Clinical Narrative:                  Spoke with patient at the bedside. PTA patient had recently moved and had misplaced her nebulizer. States that she needed her nebulizer not dialysis. Reports feeling better and ready to go home. Has PCS worker, but could not recall agency stating it is a bunch of letters. Patient states that therapy is supposed to see her. TOC to follow for recommendations and will arrange services as needed.   Expected Discharge Plan: Home/Self Care Barriers to Discharge: Continued Medical Work up   Patient Goals and CMS Choice Patient states their goals for this hospitalization and ongoing recovery are:: return home CMS Medicare.gov Compare Post Acute Care list provided to:: Patient Choice offered to / list presented to : NA  Expected Discharge Plan and Services Expected Discharge Plan: Home/Self Care In-house Referral: NA Discharge Planning Services: CM Consult Post Acute Care Choice: NA Living arrangements for the past 2 months: Single Family Home Expected Discharge Date: 05/11/19               DME Arranged: N/A DME Agency: NA       HH Arranged: NA [place] Agency: NA        Prior Living Arrangements/Services Living arrangements for the past 2 months: Single Family Home Lives with:: Self, Adult Children Patient language and need for interpreter reviewed:: Yes Do you feel safe going back to the place where you live?: Yes      Need for Family Participation in Patient Care: Yes (Comment) Care giver support system in place?: Yes (comment)   Criminal Activity/Legal Involvement Pertinent to Current Situation/Hospitalization: No - Comment as needed  Activities of Daily Living Home Assistive Devices/Equipment:  None ADL Screening (condition at time of admission) Patient's cognitive ability adequate to safely complete daily activities?: Yes Is the patient deaf or have difficulty hearing?: No Does the patient have difficulty seeing, even when wearing glasses/contacts?: No Does the patient have difficulty concentrating, remembering, or making decisions?: No Patient able to express need for assistance with ADLs?: Yes Does the patient have difficulty dressing or bathing?: No Independently performs ADLs?: Yes (appropriate for developmental age) Does the patient have difficulty walking or climbing stairs?: No Weakness of Legs: None Weakness of Arms/Hands: None  Permission Sought/Granted                  Emotional Assessment Appearance:: Appears stated age Attitude/Demeanor/Rapport: Engaged Affect (typically observed): Accepting Orientation: : Oriented to Self, Oriented to  Time, Oriented to Place, Oriented to Situation Alcohol / Substance Use: Not Applicable Psych Involvement: No (comment)  Admission diagnosis:  Acute pulmonary edema (HCC) [J81.0] Respiratory failure with hypoxia (HCC) [J96.91] Acute respiratory failure with hypoxia ([place]) [J96.01] Patient Active Problem List   Diagnosis Date Noted  . Respiratory failure with hypoxia ([place]) 05/10/2019  . Pneumonia 05/10/2019  . 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 Duodenal ulcer perforation ([place]) 04/23/2019  . Cocaine abuse ([place]) 04/23/2019  . Acute gastric ulcer without hemorrhage or perforation   . Hematemesis 04/04/2019  . Atrial fibrillation with RVR ([place])   . Duodenal ulcer   . Gastritis and gastroduodenitis   . Acute blood loss anemia   .  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 RUQ pain   . Anemia due to chronic kidney disease, on chronic dialysis ([place])   . AF (paroxysmal atrial fibrillation) ([place])   . Chest pain 03/19/2019  . Hyperkalemia 02/28/2019  . Uremia 02/27/2019  . Volume overload 02/21/2019  .  Abdominal pain 02/21/2019  . Non-compliance with renal dialysis ([place]) 02/21/2019  . Respiratory failure ([place]) 02/04/2019  . Acute and chr resp failure, unsp w hypoxia or hypercapnia ([place])   . Afib ([place]) 01/25/2019  . SBO (small bowel obstruction) ([place]) 08/10/2018  . Anemia due to chronic blood loss   . Heme positive stool   . Platelet inhibition due to Plavix   . Acute GI bleeding 05/06/2018  . ESRD (end stage renal disease) ([place]) 05/06/2018  . Anxiety 05/06/2018  . Bipolar affective ([place]) 05/06/2018  . CAD (coronary artery disease), native coronary artery 05/06/2018  . COPD with chronic bronchitis ([place]) 05/06/2018  . CVA (cerebral vascular accident) ([place]) 05/06/2018  . Essential hypertension 05/06/2018  . Hyperlipidemia 05/06/2018   PCP:  Maggie Schwalbe, PA-C Pharmacy:  No Pharmacies Listed    Social Determinants of Health (SDOH) Interventions    Readmission Risk Interventions Readmission Risk Prevention Plan 03/29/2019 02/07/2019  Transportation Screening Complete Complete  Medication Review Press photographer) Complete Complete  PCP or Specialist appointment within 3-5 days of discharge - Complete  HRI or [place] Complete Complete  SW Recovery Care/Counseling Consult Complete Complete  Palliative Care Screening Not Applicable Not [place] Not Applicable Not Applicable  Some recent data might be hidden

## 2019-05-11 NOTE — Plan of Care (Signed)
  Problem: Health Behavior/Discharge Planning: Goal: Ability to manage health-related needs will improve Outcome: Completed/Met   Problem: Clinical Measurements: Goal: Diagnostic test results will improve Outcome: Completed/Met Goal: Cardiovascular complication will be avoided Outcome: Completed/Met   Problem: Activity: Goal: Risk for activity intolerance will decrease Outcome: Completed/Met   Problem: Nutrition: Goal: Adequate nutrition will be maintained Outcome: Completed/Met   Problem: Coping: Goal: Level of anxiety will decrease Outcome: Completed/Met   Problem: Elimination: Goal: Will not experience complications related to bowel motility Outcome: Completed/Met Goal: Will not experience complications related to urinary retention Outcome: Completed/Met   Problem: Pain Managment: Goal: General experience of comfort will improve Outcome: Completed/Met   Problem: Safety: Goal: Ability to remain free from injury will improve Outcome: Completed/Met   Problem: Skin Integrity: Goal: Risk for impaired skin integrity will decrease Outcome: Completed/Met   Problem: Education: Goal: Knowledge of disease and its progression will improve Outcome: Completed/Met Goal: Individualized Educational Video(s) Outcome: Completed/Met   Problem: Fluid Volume: Goal: Compliance with measures to maintain balanced fluid volume will improve Outcome: Completed/Met   Problem: Health Behavior/Discharge Planning: Goal: Ability to manage health-related needs will improve Outcome: Completed/Met   Problem: Nutritional: Goal: Ability to make healthy dietary choices will improve Outcome: Completed/Met   Problem: Clinical Measurements: Goal: Complications related to the disease process, condition or treatment will be avoided or minimized Outcome: Completed/Met

## 2019-05-11 NOTE — Evaluation (Signed)
Physical Therapy Evaluation & Discharge Patient Details Name: Christina [name] MRN: [id number] DOB: 12-16-1950 Today's Date: 05/11/2019   History of Present Illness  Pt is a 69 y/o female admitted secondary to SOB, found to have acute respiratory failure with hypoxia, volume overload vs HCAP. PMH including but not limited to ESRD on HD (TTS), A. Fib, COPD, HTN, HLD, CAD s/p stents, GERD, cocaine abuse.  Clinical Impression  Pt presented supine in bed with HOB elevated, awake and willing to participate in therapy session. Prior to admission, pt reported that she was independent with all functional mobility and ADLs. Pt lives with her two daughters in a single level home with a level entry. At the time of evaluation, pt overall at a supervision level or independent with all functional mobility. She remained without any SOB or symptoms throughout, on RA with SPO2 maintaining in the high 90's. Pt also participated in a higher level balance assessment and scored a 23/24 on the DGI, indicating that she is a safe community ambulator. Pt reporting that she feels she is at her baseline in regards to mobility. No further acute PT needs identified at this time. PT signing off.     Follow Up Recommendations No PT follow up    Equipment Recommendations  None recommended by PT    Recommendations for Other Services       Precautions / Restrictions Precautions Precautions: None Restrictions Weight Bearing Restrictions: No      Mobility  Bed Mobility Overal bed mobility: Independent                Transfers Overall transfer level: Independent                  Ambulation/Gait Ambulation/Gait assistance: Supervision Gait Distance (Feet): 400 Feet Assistive device: None Gait Pattern/deviations: Step-through pattern Gait velocity: WFL   General Gait Details: pt steady overall with hallway ambulation without use of an AD; pt able to navigate around obstacles and perform higher level  balance tasks without difficulties  Stairs            Wheelchair Mobility    Modified Rankin (Stroke Patients Only)       Balance Overall balance assessment: Independent                           High level balance activites: Direction changes;Turns;Sudden stops;Head turns   Standardized Balance Assessment Standardized Balance Assessment : Dynamic Gait Index   Dynamic Gait Index Level Surface: Normal Change in Gait Speed: Normal Gait with Horizontal Head Turns: Normal Gait with Vertical Head Turns: Normal Gait and Pivot Turn: Normal Step Over Obstacle: Normal Step Around Obstacles: Normal Steps: Mild Impairment Total Score: 23       Pertinent Vitals/Pain Pain Assessment: No/denies pain    Home Living Family/patient expects to be discharged to:: Private residence Living Arrangements: Children Available Help at Discharge: Family;Available 24 hours/day Type of Home: House Home Access: Level entry     Home Layout: One level Home Equipment: Walker - 2 wheels;Cane - single point      Prior Function Level of Independence: Independent               Hand Dominance        Extremity/Trunk Assessment   Upper Extremity Assessment Upper Extremity Assessment: Overall WFL for tasks assessed    Lower Extremity Assessment Lower Extremity Assessment: Overall WFL for tasks assessed  Communication   Communication: No difficulties  Cognition Arousal/Alertness: Awake/alert Behavior During Therapy: WFL for tasks assessed/performed Overall Cognitive Status: Within Functional Limits for tasks assessed                                        General Comments      Exercises     Assessment/Plan    PT Assessment Patent does not need any further PT services  PT Problem List         PT Treatment Interventions      PT Goals (Current goals can be found in the Care Plan section)  Acute Rehab PT Goals Patient Stated  Goal: "home tonight or tomorrow!" PT Goal Formulation: All assessment and education complete, DC therapy    Frequency     Barriers to discharge        Co-evaluation               AM-PAC PT "6 Clicks" Mobility  Outcome Measure Help needed turning from your back to your side while in a flat bed without using bedrails?: None Help needed moving from lying on your back to sitting on the side of a flat bed without using bedrails?: None Help needed moving to and from a bed to a chair (including a wheelchair)?: None Help needed standing up from a chair using your arms (e.g., wheelchair or bedside chair)?: None Help needed to walk in hospital room?: None Help needed climbing 3-5 steps with a railing? : None 6 Click Score: 24    End of Session   Activity Tolerance: Patient tolerated treatment well Patient left: in bed;with call bell/phone within reach Nurse Communication: Mobility status PT Visit Diagnosis: Other abnormalities of gait and mobility (R26.89)    Time: 9179-1505 PT Time Calculation (min) (ACUTE ONLY): 18 min   Charges:   PT Evaluation $PT Eval Low Complexity: 1 Low          Eduard Clos, PT, DPT  Acute Rehabilitation Services Pager [phone] Office [place] 05/11/2019, 4:56 PM

## 2019-05-11 NOTE — Care Management Obs Status (Signed)
[place] NOTIFICATION   Patient Details  Name: SAMANTHAJO PAYANO MRN: [id number] Date of Birth: Nov 02, 1950   Medicare Observation Status Notification Given:  Yes    Bartholomew Crews, RN 05/11/2019, 3:33 PM

## 2019-05-11 NOTE — Care Management CC44 (Signed)
Condition Code 44 Documentation Completed  Patient Details  Name: JADWIGA FAIDLEY MRN: [id number] Date of Birth: 1950/07/05   Condition Code 44 given:  Yes Patient signature on Condition Code 44 notice:  Yes Documentation of 2 MD's agreement:  Yes Code 44 added to claim:  Yes    Bartholomew Crews, RN 05/11/2019, 3:33 PM

## 2019-05-11 NOTE — Procedures (Signed)
I was present at this dialysis session. I have reviewed the session itself and made appropriate changes.   Vital signs in last 24 hours:  Temp:  [97.4 F (36.3 C)-97.8 F (36.6 C)] 97.6 F (36.4 C) (04/14 0715) Pulse Rate:  [66-79] 73 (04/14 0800) Resp:  [12-26] 19 (04/14 0715) BP: (140-180)/(65-91) 173/91 (04/14 0800) SpO2:  [90 %-100 %] 97 % (04/14 0715) Weight:  [43.5 kg-46.7 kg] 46.7 kg (04/14 0715) Weight change:  Filed Weights   05/10/19 1205 05/10/19 2031 05/11/19 0715  Weight: 43.5 kg 46.1 kg 46.7 kg    Recent Labs  Lab 05/11/19 0441  NA 135  K 5.2*  CL 101  CO2 20*  GLUCOSE 119*  BUN 91*  CREATININE 6.32*  CALCIUM 8.6*  PHOS 5.3*    Recent Labs  Lab 05/11/19 0441  WBC 12.4*  HGB 7.8*  HCT 25.5*  MCV 85.3  PLT 278    Scheduled Meds: . allopurinol  100 mg Oral Daily  . amLODipine  10 mg Oral QHS  . arformoterol  15 mcg Nebulization BID  . aspirin EC  81 mg Oral Daily  . budesonide (PULMICORT) nebulizer solution  0.5 mg Nebulization BID  . [START ON 05/12/2019] calcitRIOL  1 mcg Oral Q T,Th,Sa-HD  . Chlorhexidine Gluconate Cloth  6 each Topical Q0600  . clopidogrel  75 mg Oral Q breakfast  . diphenhydrAMINE  50 mg Oral QHS  . ferric citrate  420 mg Oral TID WC  . gabapentin  300 mg Oral QHS  . heparin  5,000 Units Subcutaneous Q8H  . isosorbide mononitrate  30 mg Oral QHS  . metoprolol tartrate  100 mg Oral BID  . nicotine  21 mg Transdermal Daily  . pantoprazole  40 mg Oral BID  . senna-docusate  1 tablet Oral BID  . sodium chloride flush  3 mL Intravenous Q12H  . sucralfate  1 g Oral Q6H   Continuous Infusions: . sodium chloride    . sodium chloride     PRN Meds:.sodium chloride, sodium chloride, acetaminophen **OR** acetaminophen, alteplase, clobetasol cream, docusate sodium, heparin, ipratropium-albuterol, lidocaine (PF), lidocaine-prilocaine, lidocaine-prilocaine, pentafluoroprop-tetrafluoroeth, polyvinyl alcohol    Dialyzes at [facility] - TTS  - 4 hours   EDW 40.5-  Has not been getting there for unclear reasons, BP is high. HD Bath 2/2, Dialyzer 180, Heparin no-  Just in [facility] . Access has AVF-  Said used for first time on Sat-  Still with TDC in place- profile #2 mircera 100 q 2 weeks- calcitriol 1.0 q treatment.  hgb on 4/6 was 9.7.  On 3/16 calc 8.6, phos 6.3 , pth 474  Assessment and Plan: 1. Shortness of breath- due to noncompliance with HD.  Feeling better while on HD 2. ESRD- off schedule.  Plan to get back on schedule tomorrow either here or at her outpatient center 3. Anemia of ESRD- cont with ESA.  Will dose aranesp with HD today. 4. HTN- follow with [facility]. 5. Noncompliance- ongoing issue.  Christina Potts,  MD 05/11/2019, 8:34 AM

## 2019-05-12 DIAGNOSIS — E8770 Fluid overload, unspecified: Secondary | ICD-10-CM | POA: Diagnosis not present

## 2019-05-12 DIAGNOSIS — I10 Essential (primary) hypertension: Secondary | ICD-10-CM | POA: Diagnosis not present

## 2019-05-12 DIAGNOSIS — J449 Chronic obstructive pulmonary disease, unspecified: Secondary | ICD-10-CM | POA: Diagnosis not present

## 2019-05-12 DIAGNOSIS — J9601 Acute respiratory failure with hypoxia: Secondary | ICD-10-CM | POA: Diagnosis not present

## 2019-05-12 DIAGNOSIS — N186 End stage renal disease: Secondary | ICD-10-CM | POA: Diagnosis not present

## 2019-05-12 LAB — CBC WITH DIFFERENTIAL/PLATELET
Abs Immature Granulocytes: 0.06 10*3/uL (ref 0.00–0.07)
Basophils Absolute: 0 10*3/uL (ref 0.0–0.1)
Basophils Relative: 1 %
Eosinophils Absolute: 0.1 10*3/uL (ref 0.0–0.5)
Eosinophils Relative: 2 %
HCT: 35.3 % — ABNORMAL LOW (ref 36.0–46.0)
Hemoglobin: 10.7 g/dL — ABNORMAL LOW (ref 12.0–15.0)
Immature Granulocytes: 1 %
Lymphocytes Relative: 23 %
Lymphs Abs: 1.9 10*3/uL (ref 0.7–4.0)
MCH: 26 pg (ref 26.0–34.0)
MCHC: 30.3 g/dL (ref 30.0–36.0)
MCV: 85.9 fL (ref 80.0–100.0)
Monocytes Absolute: 0.7 10*3/uL (ref 0.1–1.0)
Monocytes Relative: 8 %
Neutro Abs: 5.4 10*3/uL (ref 1.7–7.7)
Neutrophils Relative %: 65 %
Platelets: 345 10*3/uL (ref 150–400)
RBC: 4.11 MIL/uL (ref 3.87–5.11)
RDW: 16.7 % — ABNORMAL HIGH (ref 11.5–15.5)
WBC: 8.2 10*3/uL (ref 4.0–10.5)
nRBC: 0 % (ref 0.0–0.2)

## 2019-05-12 LAB — RENAL FUNCTION PANEL
Albumin: 2.7 g/dL — ABNORMAL LOW (ref 3.5–5.0)
Anion gap: 12 (ref 5–15)
BUN: 28 mg/dL — ABNORMAL HIGH (ref 8–23)
CO2: 28 mmol/L (ref 22–32)
Calcium: 8.4 mg/dL — ABNORMAL LOW (ref 8.9–10.3)
Chloride: 95 mmol/L — ABNORMAL LOW (ref 98–111)
Creatinine, Ser: 3.68 mg/dL — ABNORMAL HIGH (ref 0.44–1.00)
GFR calc Af Amer: 14 mL/min — ABNORMAL LOW (ref >60)
GFR calc non Af Amer: 12 mL/min — ABNORMAL LOW (ref >60)
Glucose, Bld: 72 mg/dL (ref 70–99)
Phosphorus: 2.2 mg/dL — ABNORMAL LOW (ref 2.5–4.6)
Potassium: 3.7 mmol/L (ref 3.5–5.1)
Sodium: 135 mmol/L (ref 135–145)

## 2019-05-12 LAB — TYPE AND SCREEN
ABO/RH(D): A POS
Antibody Screen: NEGATIVE

## 2019-05-12 MED ORDER — SODIUM CHLORIDE 0.9 % IV SOLN: 100.0000 mL | INTRAVENOUS | Status: DC | PRN

## 2019-05-12 MED ORDER — ALTEPLASE 2 MG IJ SOLR: 2.0000 mg | Freq: Once | INTRAMUSCULAR | Status: DC | PRN

## 2019-05-12 MED ORDER — CALCITRIOL 0.5 MCG PO CAPS
ORAL_CAPSULE | ORAL | Status: AC
  Filled 2019-05-12: qty 2

## 2019-05-12 MED ORDER — HEPARIN SODIUM (PORCINE) 1000 UNIT/ML DIALYSIS: 1000.0000 [IU] | INTRAMUSCULAR | Status: DC | PRN

## 2019-05-12 MED ORDER — LIDOCAINE HCL (PF) 1 % IJ SOLN: 5.0000 mL | INTRAMUSCULAR | Status: DC | PRN

## 2019-05-12 MED ORDER — PENTAFLUOROPROP-TETRAFLUOROETH EX AERO: 1.0000 "application " | INHALATION_SPRAY | CUTANEOUS | Status: DC | PRN

## 2019-05-12 NOTE — Discharge Summary (Signed)
Discharge Summary  Christina [name] IOE:[id number] DOB: 1950-07-07  PCP: Christina Schwalbe, PA-C  Admit date: 05/10/2019 Discharge date: 05/12/2019  Time spent: 40 mins  Recommendations for Outpatient Follow-up:  1. Follow-up with PCP in 1 week 2. Nephrology as scheduled  Discharge Diagnoses:  Active Hospital Problems   Diagnosis Date Noted  . Respiratory failure with hypoxia ([place]) 05/10/2019  . Pneumonia 05/10/2019  . Gastritis and gastroduodenitis   . Volume overload 02/21/2019  . COPD with chronic bronchitis ([place]) 05/06/2018  . ESRD (end stage renal disease) ([place]) 05/06/2018  . Essential hypertension 05/06/2018    Resolved Hospital Problems  No resolved problems to display.    Discharge Condition: Stable  Diet recommendation: Renal diet  Vitals:   05/12/19 0824 05/12/19 0913  BP: (!) 176/85 (!) 177/64  Pulse: 81 70  Resp: (!) 22   Temp: 97.8 F (36.6 C)   SpO2: 98%     History of present illness:  Christina Loge Bowenis a 69 y.o.femalewith medical history significant ofESRD on HD(TTS), A. Fib,COPD, HTN, HLD, CADs/pstents, GERD, cocaine abuse, and SBO presented with complaints of shortness of breath to [place] initially on 4/12. Patient reports that she has not smoked cigarettes in over 6 months. [place] [facility] [place] wasfound to be activelywheezingwith O2 saturation reported to be as low as 89% on room air and was placed on 4 L of nasal cannula oxygen. After patient received DuoNeb breathing treatments she had significantimprovement in symptoms, but went into A. fib with heart rates 115-125. Chest x-ray showing a multifocal pneumoniaversus edema.She was started on aCardizem drip.Labs on 4/12significant forWBC 10.6, hemoglobin 10.8,potassium 3.9, BUN 56, creatinine 5.7, proBNP 124000, procalcitonin 0.37, and troponin negative x2. COVID-19 and influenza screening were negative. Cultures were obtained and patient was started on empiric  antibioticsof vancomycin and aztreonam for suspected pneumonia. The next day, patient converted back into sinus rhythm and was restarted on home meds. Labs from 4/13 revealed WBC 11.2, hemoglobin 8.8 sodium 135, potassium 4.8, CO2 23, BUN 78, creatinine 6, and anion gap 18. Patient had just recently been hospitalized from 3/27-4/2 after presenting with complaints of abdominal pain. EGD revealed gastritis, esophagitis, and multiple duodenal ulcers. Vascular surgery has been consulted and has symptoms were thought to be possible to a ischemic cause. Patient underwent SMA stent placement by vascular surgery currently on aspirin and Plavix.  Patient admitted for further management.     Today, patient denies any new complaints, reports breathing has improved back to baseline.  Patient denies any chest pain, abdominal pain, nausea/vomiting, fever/chills.  Patient advised extensively to be compliant with dialysis, and medications.    Hospital Course:  Principal Problem:   Respiratory failure with hypoxia (HCC) Active Problems:   ESRD (end stage renal disease) ([place])   COPD with chronic bronchitis (HCC)   Essential hypertension   Volume overload   Gastritis and gastroduodenitis   Pneumonia   Acute respiratory failure with hypoxia likely 2/2 volume overload Vs COPD exacerbation Improved Currently saturating well on room air, afebrile with resolved leukocytosis BC X2 at [facility] showed no growth Procalcitonin elevated 0.34 Initially started on vancomycin and aztreonam, discontinued, repeat chest x-ray with COPD changes, no evidence of pneumonia Continue duo nebs, inhalers Follow-up with PCP  ESRD on HD Presents with volume overload due to missed dialysis Nephrology on board, had dialysis on 05/11/2019, plan for another HD session on 05/12/2019 Follow-up with nephrology as an outpatient  Hyperkalemia Resolved Managed by HD  Anemia of CKD Management  per  nephrology  Hypertension Continue home regimen  Peripheral vascular disease Recent SMA stenting by vascular surgery Continue aspirin, Plavix  GERD/esophagitis/duodenal ulcer EGD from 03/2018 showed above Continue Protonix, Carafate  Polysubstance abuse History of cocaine abuse Advised to abstain       Malnutrition Type:      Malnutrition Characteristics:      Nutrition Interventions:      Estimated body mass index is 17.34 kg/m as calculated from the following:   Height as of this encounter: 5\' 2"  (1.575 m).   Weight as of this encounter: 43 kg.    Procedures:  None  Consultations:  Nephrology  Discharge Exam: BP (!) 177/64 (BP Location: Right Arm)   Pulse 70   Temp 97.8 F (36.6 C) (Oral)   Resp (!) 22   Ht 5\' 2"  (1.575 m)   Wt 43 kg   SpO2 98%   BMI 17.34 kg/m   General: NAD Cardiovascular: S1, S2 present Respiratory: Diminished breath sounds bilaterally  Discharge Instructions You were cared for by a hospitalist during your hospital stay. If you have any questions about your discharge medications or the care you received while you were in the hospital after you are discharged, you can call the unit and asked to speak with the hospitalist on call if the hospitalist that took care of you is not available. Once you are discharged, your primary care physician will handle any further medical issues. Please note that NO REFILLS for any discharge medications will be authorized once you are discharged, as it is imperative that you return to your primary care physician (or establish a relationship with a primary care physician if you do not have one) for your aftercare needs so that they can reassess your need for medications and monitor your lab values.  Discharge Instructions    Diet - low sodium heart healthy   Complete by: As directed    Increase activity slowly   Complete by: As directed      Allergies as of 05/12/2019      Reactions    Cyclobenzaprine Anaphylaxis, Other (See Comments)   "stopped heart"   Morphine Anaphylaxis   "stopped heart"   Penicillins Shortness Of Breath, Swelling, Palpitations   Did it involve swelling of the face/tongue/throat, SOB, or low BP? Yes Did it involve sudden or severe rash/hives, skin peeling, or any reaction on the inside of your mouth or nose? No Did you need to seek medical attention at a hospital or doctor's office? No When did it last happen?years  If all above answers are "NO", may proceed with cephalosporin use.   Ambien [zolpidem] Other (See Comments)   Severe confusion   Codeine Itching, Rash   Hydromorphone Other (See Comments)   If administered quickly, felt like hand was "exploding"       Medication List    TAKE these medications   albuterol (2.5 MG/3ML) 0.083% nebulizer solution Commonly known as: PROVENTIL Take 2.5 mg by nebulization every 6 (six) hours as needed for wheezing or shortness of breath.   allopurinol 100 MG tablet Commonly known as: ZYLOPRIM Take 100 mg by mouth daily.   amLODipine 10 MG tablet Commonly known as: NORVASC Take 10 mg by mouth at bedtime.   aspirin 81 MG EC tablet Take 1 tablet (81 mg total) by mouth daily.   Auryxia 1 GM 210 MG(Fe) tablet Generic drug: ferric citrate Take 420 mg by mouth daily.   budesonide-formoterol 160-4.5 MCG/ACT inhaler Commonly known as:  SYMBICORT Inhale 2 puffs into the lungs every 4 (four) hours as needed (shortness of breath/wheezing).   calcitRIOL 0.5 MCG capsule Commonly known as: ROCALTROL Take 2 capsules (1 mcg total) by mouth Every Tuesday,Thursday,and Saturday with dialysis.   clobetasol 0.05 % external solution Commonly known as: TEMOVATE Apply 1 application topically daily as needed (psoriasis).   clopidogrel 75 MG tablet Commonly known as: PLAVIX Take 1 tablet (75 mg total) by mouth daily with breakfast.   dicyclomine 10 MG capsule Commonly known as: BENTYL Take 20 mg by  mouth at bedtime.   diphenhydrAMINE 25 MG tablet Commonly known as: BENADRYL Take 50 mg by mouth at bedtime.   docusate sodium 100 MG capsule Commonly known as: COLACE Take 1 capsule (100 mg total) by mouth 2 (two) times daily. What changed:   how much to take  when to take this  reasons to take this   gabapentin 300 MG capsule Commonly known as: NEURONTIN Take 1 capsule (300 mg total) by mouth at bedtime.   Ipratropium-Albuterol 20-100 MCG/ACT Aers respimat Commonly known as: COMBIVENT Inhale 2 puffs into the lungs 4 (four) times daily as needed for wheezing.   isosorbide mononitrate 30 MG 24 hr tablet Commonly known as: IMDUR Take 30 mg by mouth at bedtime.   lidocaine-prilocaine cream Commonly known as: EMLA Apply 1 application topically daily as needed (pain at the site of port access).   metoprolol tartrate 100 MG tablet Commonly known as: LOPRESSOR Take 1 tablet (100 mg total) by mouth 2 (two) times daily.   nitroGLYCERIN 0.4 MG SL tablet Commonly known as: NITROSTAT Place 0.4 mg under the tongue every 5 (five) minutes as needed for chest pain.   ondansetron 4 MG tablet Commonly known as: ZOFRAN Take 4 mg by mouth 3 (three) times daily as needed for nausea.   pantoprazole 40 MG tablet Commonly known as: PROTONIX Take 1 tablet (40 mg total) by mouth 2 (two) times daily.   polyethylene glycol 17 g packet Commonly known as: MIRALAX / GLYCOLAX Take 17 g by mouth daily as needed for mild constipation. What changed: when to take this   sennosides-docusate sodium 8.6-50 MG tablet Commonly known as: SENOKOT-S Take 1 tablet by mouth as needed for constipation.   sucralfate 1 GM/10ML suspension Commonly known as: CARAFATE Take 10 mLs (1 g total) by mouth every 6 (six) hours.   Vitamin D3 50 MCG (2000 UT) capsule Take 2,000 Units by mouth daily.            Durable Medical Equipment  (From admission, onward)         Start     Ordered   05/12/19  1256  For home use only DME Nebulizer machine  Once    Question Answer Comment  Patient needs a nebulizer to treat with the following condition COPD (chronic obstructive pulmonary disease) ([place])   Length of Need Lifetime      05/12/19 1255         Allergies  Allergen Reactions  . Cyclobenzaprine Anaphylaxis and Other (See Comments)    "stopped heart"   . Morphine Anaphylaxis    "stopped heart"  . Penicillins Shortness Of Breath, Swelling and Palpitations    Did it involve swelling of the face/tongue/throat, SOB, or low BP? Yes Did it involve sudden or severe rash/hives, skin peeling, or any reaction on the inside of your mouth or nose? No Did you need to seek medical attention at a hospital or doctor's office? No  When did it last happen?years  If all above answers are "NO", may proceed with cephalosporin use.   . Ambien [Zolpidem] Other (See Comments)    Severe confusion  . Codeine Itching and Rash  . Hydromorphone Other (See Comments)    If administered quickly, felt like hand was "exploding"    Follow-up Information    Nodal, [name], PA-C. Schedule an appointment as soon as possible for a visit in 1 week(s).   Specialty: Physician Assistant Contact information: [address] [place] [address] [phone]            The results of significant diagnostics from this hospitalization (including imaging, microbiology, ancillary and laboratory) are listed below for reference.    Significant Diagnostic Studies: CT ABDOMEN PELVIS W CONTRAST  Result Date: 04/23/2019 CLINICAL DATA:  Nausea, vomiting, and diarrhea. Tachycardia. EXAM: CT ABDOMEN AND PELVIS WITH CONTRAST 2:19 p.m. TECHNIQUE: Multidetector CT imaging of the abdomen and pelvis was performed using the standard protocol following bolus administration of intravenous contrast. CONTRAST:  139mL OMNIPAQUE IOHEXOL 300 MG/ML  SOLN COMPARISON:  CT scans dated 04/23/2019 at 4:14 a.m. at [facility] and 03/06/2019 and 03/25/2019 FINDINGS: Lower chest: No acute abnormality. Severe atherosclerosis of the distal thoracic aorta. Slight linear scarring at the left lung base, stable. Hepatobiliary, stomach/Bowel: A small amount of fluid surrounds the contracted gallbladder. The gallbladder has contracted slightly since the prior study. The fluid extends around the duodenum just distal to the duodenal bulb. Slight prominence of multiple distal small bowel loops without obstruction. This is nonspecific. Appendix has been removed. There appears to be an 18 mm ulcer protruding posteriorly just distal to the duodenal bulb best seen on image 38 of series 7 and image 25 of series 3 and image 42 of series 6. This was not definable on the prior study done earlier today. Pancreas: Unremarkable. No pancreatic ductal dilatation or surrounding inflammatory changes. Spleen: Normal in size without focal abnormality. Adrenals/Urinary Tract: Bilateral adrenal adenomas, unchanged. Multiple bilateral renal cysts. No hydronephrosis. Bladder is normal. Vascular/Lymphatic: Extensive aortic atherosclerosis. No adenopathy. Reproductive: Status post hysterectomy. No adnexal masses. Other: No ascites. No abdominal wall hernias. Musculoskeletal: No acute or significant osseous findings. IMPRESSION: 1. Findings consistent with an 18 mm ulcer protruding posteriorly just distal to the duodenal bulb. There is fluid surrounding this ulcer and extending around the gallbladder which could represent impending perforation. 2. Small amount of fluid surrounds the contracted gallbladder. 3. Slight prominence of multiple distal small bowel loops without obstruction. This is nonspecific. 4. Stable bilateral adrenal adenomas. 5. Severe atherosclerosis of the distal thoracic aorta. Aortic Atherosclerosis (ICD10-I70.0). Electronically Signed   By: Lorriane Shire M.D.   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  Ultrasound guided access of the right common femoral artery 2.  Mesenteric arteriogram 3.  SMA angioplasty with stent placement (6 mm x 18 mm Herculink balloon expandable bare-metal stent) 4.  Mynx closure of the right common femoral artery 5.  29 minutes of monitored moderate conscious sedation time  Indications: Patient is a 69 year old female who presented with upper abdominal pain and  also found to have a duodenal ulcer.  She has been followed by GI.  She was found to have occlusion of her celiac artery and a suspected high-grade narrowing of her superior mesenteric artery.  She presents today for planned mesenteric arteriogram and possible intervention after risk and benefits are discussed.  Findings:  Mesenteric arteriogram shows an occluded celiac artery at the ostium with reconstitution of distal main branches through collaterals.  The SMA has a high-grade proximal stenosis but it measures 6 mm and much healthier in the mid to distal segment.  Ultimately the SMA was cannulated with an IM guide cath and then we advanced an 014 stabilizer wire.  This was then primarily stented with a balloon expandable bare-metal stent using a 6 mm x 18 mm Herculink.  The ostium of the stent was flared to 7 mm.  She now has widely patent SMA with no residual stenosis and excellent filling distally.             Procedure:  The patient was identified in the holding area and taken to room 7.  The patient was then placed supine on the table and prepped and draped in the usual sterile fashion.  A time out was called.  Ultrasound was used to evaluate the right common femoral artery.  It was patent .  A digital  ultrasound image was acquired.  A micropuncture needle was used to access the right common femoral artery under ultrasound guidance.  An 018 wire was advanced without resistance and a micropuncture sheath was placed.  The 018 wire was removed and a benson wire was placed.  The micropuncture sheath was exchanged for a 6 french sheath.  An omniflush catheter was advanced over the wire to the level of L-1.  Next the C-arm was placed in lateral position and a mesenteric arteriogram was obtained.  Patient was fairly uncooperative and it was difficult image given she was not really following breathing instruction throughout the case.  Ultimately we determined that the celiac was occluded as demonstrated on CTA with filling of the main branches through collaterals.  The SMA had a high-grade stenosis proximally.  Ultimately we then placed a long 6 [place] IM guide cath and with an 014 stabilizer wire cannulated the SMA.  We advanced the guide cath to advance into the ostium of the artery.  We then selected a 6 mm x 18 mm balloon expandable Herculink there was advanced over the stabilizer wire.  We pulled the guide cath back and took some hand injections in order to visualize the anterior wall of the aorta to allow the stent to hang out 1 to 2 mm beyond the ostium.  Ultimately the stent was deployed to nominal pressure.  We then used a 6 mm Viatrak balloon that was overinflated to flare the proximal end of the stent to 7 mm.  We tried to use our IM guide cath to get pictures and it appeared the stent was widely patent but again she was breathing throughout the case while we were trying to get good images.  We put a Omni Flush catheter back up and got one final mesenteric arteriogram that showed a widely patent SMA with stent in place.  That point time wires and catheters removed a mynx closure was deployed in the right groin.  Plan: Patient will be loaded on Plavix and will need to start Plavix daily as well as aspirin if  okay with GI and primary team.   Gwenyth Allegra.  Carlis Abbott, MD [facility] Office: [phone]    DG Chest Port 1 View  Result Date: 05/11/2019 CLINICAL DATA:  Dyspnea, hypertension, asthma, COPD, atrial fibrillation, chronic kidney disease EXAM: PORTABLE CHEST 1 VIEW COMPARISON:  Portable exam 1309 hours compared to 05/10/2019 FINDINGS: Dual-lumen RIGHT jugular catheter with tip projecting over cavoatrial junction unchanged. Upper normal heart size. Mediastinal contours and pulmonary vascularity normal. Atherosclerotic calcification aorta. Bronchitic changes and hyperinflation question COPD. Subsegmental atelectasis LEFT base. Skin folds project over chest bilaterally. No infiltrate, pleural effusion or pneumothorax. IMPRESSION: COPD changes with subsegmental atelectasis at LEFT base. Electronically Signed   By: Lavonia Dana M.D.   On: 05/11/2019 13:30   DG Chest Port 1 View  Result Date: 04/23/2019 CLINICAL DATA:  Abdominal pain EXAM: PORTABLE CHEST 1 VIEW COMPARISON:  Chest radiograph dated 04/11/2019 FINDINGS: The heart is enlarged. Vascular calcifications are seen in the aortic arch. Both lungs are clear. The visualized skeletal structures are unremarkable. A right internal jugular central venous catheter tip overlies the right atrium. IMPRESSION: No active disease. Electronically Signed   By: Zerita Boers M.D.   On: 04/23/2019 13:39   VAS [place] ABI WITH/WO TBI  Result Date: 04/25/2019 LOWER EXTREMITY DOPPLER STUDY Indications: Claudication. High Risk         Hypertension, hyperlipidemia, past history of smoking, prior Factors:          MI.  Comparison Study: No prior exam. Performing Technologist: Baldwin Crown RVT, RDMS  Examination Guidelines: A complete evaluation includes at minimum, Doppler waveform signals and systolic blood pressure reading at the level of bilateral brachial, anterior tibial, and posterior tibial arteries, when vessel segments are accessible.  Bilateral testing is considered an integral part of a complete examination. Photoelectric Plethysmograph (PPG) waveforms and toe systolic pressure readings are included as required and additional duplex testing as needed. Limited examinations for reoccurring indications may be performed as noted.  ABI Findings: +---------+------------------+-----+-------------------+--------+ Right    Rt Pressure (mmHg)IndexWaveform           Comment  +---------+------------------+-----+-------------------+--------+ Brachial 122                    triphasic                   +---------+------------------+-----+-------------------+--------+ PTA      104               0.85 dampened monophasic         +---------+------------------+-----+-------------------+--------+ DP       114               0.93 dampened monophasic         +---------+------------------+-----+-------------------+--------+ Great Toe43                0.35 Abnormal                    +---------+------------------+-----+-------------------+--------+ +---------+------------------+-----+-------------------+-------------+ Left     Lt Pressure (mmHg)IndexWaveform           Comment       +---------+------------------+-----+-------------------+-------------+ Brachial                                           UTO - fistula +---------+------------------+-----+-------------------+-------------+ PTA      130               1.07 monophasic                       +---------+------------------+-----+-------------------+-------------+  DP       92                0.75 dampened monophasic              +---------+------------------+-----+-------------------+-------------+ Great Toe59                0.48 Abnormal                         +---------+------------------+-----+-------------------+-------------+ +-------+-----------+-----------+------------+------------+ ABI/TBIToday's ABIToday's TBIPrevious ABIPrevious TBI  +-------+-----------+-----------+------------+------------+ Right  0.93       0.35                                +-------+-----------+-----------+------------+------------+ Left   1.07       0.48                                +-------+-----------+-----------+------------+------------+  Summary: Right: Resting right ankle-brachial index indicates mild right lower extremity arterial disease. The right toe-brachial index is abnormal. Left: Resting left ankle-brachial index is within normal range. No evidence of significant left lower extremity arterial disease. The left toe-brachial index is abnormal.  *See table(s) above for measurements and observations.  Electronically signed by Ruta Hinds MD on 04/25/2019 at 8:29:38 PM.    Final     Microbiology: Recent Results (from the past 240 hour(s))  SARS CORONAVIRUS 2 (TAT 6-24 HRS) Nasopharyngeal Nasopharyngeal Swab     Status: None   Collection Time: 05/10/19  6:30 PM   Specimen: Nasopharyngeal Swab  Result Value Ref Range Status   SARS Coronavirus 2 NEGATIVE NEGATIVE Final    Comment: (NOTE) SARS-CoV-2 target nucleic acids are NOT DETECTED. The SARS-CoV-2 RNA is generally detectable in upper and lower respiratory specimens during the acute phase of infection. Negative results do not preclude SARS-CoV-2 infection, do not rule out co-infections with other pathogens, and should not be used as the sole basis for treatment or other patient management decisions. Negative results must be combined with clinical observations, patient history, and epidemiological information. The expected result is Negative. Fact Sheet for Patients: SugarRoll.be Fact Sheet for Healthcare Providers: https://www.woods-mathews.com/ This test is not yet approved or cleared by the [place] FDA and  has been authorized for detection and/or diagnosis of SARS-CoV-2 by FDA under an Emergency Use Authorization (EUA). This  EUA will remain  in effect (meaning this test can be used) for the duration of the COVID-19 declaration under Section 56 4(b)(1) of the Act, 21 U.S.C. section 360bbb-3(b)(1), unless the authorization is terminated or revoked sooner. Performed at [facility], [place] [address], [place], [place] [address]   MRSA PCR Screening     Status: None   Collection Time: 05/10/19  8:32 PM   Specimen: Nasopharyngeal  Result Value Ref Range Status   MRSA by PCR NEGATIVE NEGATIVE Final    Comment:        The GeneXpert MRSA Assay (FDA approved for NASAL specimens only), is one component of a comprehensive MRSA colonization surveillance program. It is not intended to diagnose MRSA infection nor to guide or monitor treatment for MRSA infections. Performed at [facility], [place] [address] [place], [place] [address]      Labs: Basic Metabolic Panel: Recent Labs  Lab 05/11/19 0441 05/12/19 0256  NA 135 135  K 5.2* 3.7  CL  101 95*  CO2 20* 28  GLUCOSE 119* 72  BUN 91* 28*  CREATININE 6.32* 3.68*  CALCIUM 8.6* 8.4*  PHOS 5.3* 2.2*   Liver Function Tests: Recent Labs  Lab 05/11/19 0441 05/12/19 0256  ALBUMIN 2.2* 2.7*   No results for input(s): LIPASE, AMYLASE in the last 168 hours. No results for input(s): AMMONIA in the last 168 hours. CBC: Recent Labs  Lab 05/11/19 0441 05/12/19 0256  WBC 12.4* 8.2  NEUTROABS  --  5.4  HGB 7.8* 10.7*  HCT 25.5* 35.3*  MCV 85.3 85.9  PLT 278 345   Cardiac Enzymes: No results for input(s): CKTOTAL, CKMB, CKMBINDEX, TROPONINI in the last 168 hours. BNP: BNP (last 3 results) Recent Labs    01/24/19 2228 02/04/19 0103 03/25/19 1313  BNP 1,570.0* 4,197.2* 2,007.2*    ProBNP (last 3 results) No results for input(s): PROBNP in the last 8760 hours.  CBG: No results for input(s): GLUCAP in the last 168 hours.     Signed:  Alma Friendly, MD [facility] 05/12/2019, 12:58 PM

## 2019-05-12 NOTE — Progress Notes (Signed)
OT Cancellation Note  Patient Details Name: Christina [name] MRN: [id number] DOB: May 02, 1950   Cancelled Treatment:    Reason Eval/Treat Not Completed: (P) Patient at procedure or test/ unavailable  Morganna Styles,HILLARY 05/12/2019, 3:28 PM

## 2019-05-12 NOTE — Plan of Care (Signed)
  Problem: Education: Goal: Knowledge of General Education information will improve Description: Including pain rating scale, medication(s)/side effects and non-pharmacologic comfort measures Outcome: Adequate for Discharge   Problem: Clinical Measurements: Goal: Ability to maintain clinical measurements within normal limits will improve Outcome: Adequate for Discharge   

## 2019-05-12 NOTE — Progress Notes (Signed)
Another security called ,telling me that her daughter is 45 mins away from here.He said the he is going to bring the patient to the emergency and spoke to her daughter to pick her from there.I told her my charge nurse is on the way to help.

## 2019-05-12 NOTE — Progress Notes (Signed)
Called the security to coordinate of finding of this patient.At this very moment ,patient was approaching or in the [place] tower.Her daughter were gone.Security said,I said to security kindly watched the patient while I am planning to seek somebody to help her and at the same time to call her daughter so I place the line on hold,when I un hold the line ,the line was cut off already.

## 2019-05-12 NOTE — Progress Notes (Signed)
DISCHARGE NOTE HOME Christina [name] to be discharged home per MD order. Discussed prescriptions and follow up appointments with the patient. Prescriptions given to patient; medication list explained in detail. Patient did not listened and mistakenly took my personal papers from my medicine cadie as her discharged papers.She was real agitated and not listening for no reasons at all.  Skin clean, dry and intact without evidence of skin break down, no evidence of skin tears noted. IV catheter discontinued intact. Site without signs and symptoms of complications. Dressing and pressure applied. Pt denies pain at the site currently. No complaints noted.  Patient free of lines, drains, and wounds.   An After Visit Summary (AVS) was printed and given to the patient. Patient escorted via wheelchair, and discharged home via private auto.  [place], Zenon Mayo, RN

## 2019-05-12 NOTE — Progress Notes (Signed)
Came back from hemodialysis.patient was very agitated coz her dialysis treatment was late and and discharge papers were not ready due to unreconciled orders.Explained to patient but the patient get more ugly.Patient just walked out,nurse tried to reasoned out and explained to her that we are going to bring her down with a wheelchair but the patient keeps on walking away from the station .

## 2019-05-12 NOTE — Progress Notes (Signed)
Renal Navigator spoke with Renal NP to evaluate whether we could make a safe plan for discharge today prior to HD (since patient had HD off schedule yesterday), however, given patient's gross non-compliance with OP HD, it is felt that she is not a candidate to reschedule her for OP HD tomorrow, given significantly high chances she will not follow through with attending rescheduled treatment. Patient will have inpt HD here prior to discharge. Renal Navigator sent secure message to patient's OP HD clinic/[place] to collaborate on her care and asked that she be followed and supported as closely as possible with the hopes of increasing OP HD treatment compliance. 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  Alphonzo Cruise,  Renal Navigator [phone]

## 2019-05-12 NOTE — Progress Notes (Addendum)
[facility] Progress Note   Subjective: Feeling better, says she is going home today but no discharge note yet. Stable for DC from nephrology stand point. No C/Os. Unusually happy, in good spirits.    Objective Vitals:   05/12/19 0426 05/12/19 0805 05/12/19 0824 05/12/19 0913  BP: (!) 156/67  (!) 176/85 (!) 177/64  Pulse: 72  81 70  Resp: 20  (!) 22   Temp: 97.7 F (36.5 C)  97.8 F (36.6 C)   TempSrc:   Oral   SpO2: 97% 96% 98%   Weight:      Height:       Physical Exam General: Chronically ill appearing female in NAD Heart: S1,S2 RRR Lungs: CTAB Abdomen:NT, active BS Extremities: No LE Edema Dialysis Access: L AVF + bruit. RIJ TDC drsg intact.     Additional Objective Labs: Basic Metabolic Panel: Recent Labs  Lab 05/11/19 0441 05/12/19 0256  NA 135 135  K 5.2* 3.7  CL 101 95*  CO2 20* 28  GLUCOSE 119* 72  BUN 91* 28*  CREATININE 6.32* 3.68*  CALCIUM 8.6* 8.4*  PHOS 5.3* 2.2*   Liver Function Tests: Recent Labs  Lab 05/11/19 0441 05/12/19 0256  ALBUMIN 2.2* 2.7*   No results for input(s): LIPASE, AMYLASE in the last 168 hours. CBC: Recent Labs  Lab 05/11/19 0441 05/12/19 0256  WBC 12.4* 8.2  NEUTROABS  --  5.4  HGB 7.8* 10.7*  HCT 25.5* 35.3*  MCV 85.3 85.9  PLT 278 345   Blood Culture    Component Value Date/Time   SDES URINE, RANDOM 02/21/2019 1440   SPECREQUEST  02/21/2019 1440    NONE Performed at [facility], [place] [address], [place], [place] [address]    CULT MULTIPLE SPECIES PRESENT, SUGGEST RECOLLECTION (A) 02/21/2019 1440   REPTSTATUS 02/22/2019 FINAL 02/21/2019 1440    Cardiac Enzymes: No results for input(s): CKTOTAL, CKMB, CKMBINDEX, TROPONINI in the last 168 hours. CBG: No results for input(s): GLUCAP in the last 168 hours. Iron Studies: No results for input(s): IRON, TIBC, TRANSFERRIN, FERRITIN in the last 72 hours. @lablastinr3 @ Studies/Results: DG Chest Port 1 View  Result Date:  05/11/2019 CLINICAL DATA:  Dyspnea, hypertension, asthma, COPD, atrial fibrillation, chronic kidney disease EXAM: PORTABLE CHEST 1 VIEW COMPARISON:  Portable exam 1309 hours compared to 05/10/2019 FINDINGS: Dual-lumen RIGHT jugular catheter with tip projecting over cavoatrial junction unchanged. Upper normal heart size. Mediastinal contours and pulmonary vascularity normal. Atherosclerotic calcification aorta. Bronchitic changes and hyperinflation question COPD. Subsegmental atelectasis LEFT base. Skin folds project over chest bilaterally. No infiltrate, pleural effusion or pneumothorax. IMPRESSION: COPD changes with subsegmental atelectasis at LEFT base. Electronically Signed   By: Lavonia Dana M.D.   On: 05/11/2019 13:30   Medications:  . allopurinol  100 mg Oral Daily  . amLODipine  10 mg Oral QHS  . arformoterol  15 mcg Nebulization BID  . aspirin EC  81 mg Oral Daily  . budesonide (PULMICORT) nebulizer solution  0.5 mg Nebulization BID  . calcitRIOL  1 mcg Oral Q T,Th,Sa-HD  . Chlorhexidine Gluconate Cloth  6 each Topical Q0600  . clopidogrel  75 mg Oral Q breakfast  . diphenhydrAMINE  50 mg Oral QHS  . ferric citrate  420 mg Oral TID WC  . gabapentin  300 mg Oral QHS  . heparin  5,000 Units Subcutaneous Q8H  . isosorbide mononitrate  30 mg Oral QHS  . metoprolol tartrate  100 mg Oral BID  .  nicotine  21 mg Transdermal Daily  . pantoprazole  40 mg Oral BID  . senna-docusate  1 tablet Oral BID  . sodium chloride flush  3 mL Intravenous Q12H  . sucralfate  1 g Oral Q6H     [facility] - 4 hoursEDW 40.5- Has not been getting there for unclear reasons, BP is high. HD Bath2/2, Dialyzer180, Heparinno- Just in [facility] . Accesshas AVF- Said used for first time on Sat- Still with TDC in place- profile #2 mircera 100 q 2 weeks- calcitriol 1.0 q treatment.hgb on 4/6 was 9.7. On 3/16 calc 8.6, phos 6.3 , pth 474  Assessment and Plan: 1. Shortness of breath- due to  noncompliance with HD.  Feeling better while on HD 2. ESRD- off schedule.  Plan to get back on schedule today. DC home post HD. Discussed not missing HD this Saturday. Assures me she will attend HD on schedule but not feeling confident this will happen.  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  Aranesp 40 mcg IV with HD 04/14.  4. HTN- Elevated at present, usually falls with HD. No volume excess by exam. UF as tolerated in HD today.  5. Noncompliance- ongoing issue.  Disposition: Stable for DC from nephrology stand point. Discussed issues with noncompliance with pt. Always agrees, says she will go to HD on schedule but actually behavior usually does not match her words.   Rita H. Brown NP-C 05/12/2019, 11:50 AM  [employer] [phone]  I have seen and examined this patient and agree with plan and assessment in the above note with renal recommendations/intervention highlighted.  Should be stable for discharge after HD and follow up as an outpatient in [place] on Saturday.  Broadus John A Elaijah Munoz,MD 05/12/2019 1:10 PM

## 2019-05-12 NOTE — Progress Notes (Signed)
Daughter called ,Nurse told her that her mother just walked away from [place] without waiting for the wheelchair to bring her down.She said that ,she is going to make a call and cut off the line.

## 2019-05-12 NOTE — Progress Notes (Signed)
Called into the patient room,found patient sitting and she was already on her street cloth.She told this Probation officer that she is going home as per doctor told her.Nurse told her that she need to have her dialysis first then after hemodialysis ,then she is going home.

## 2019-05-13 ENCOUNTER — Telehealth: Payer: Self-pay | Admitting: Nurse Practitioner

## 2019-05-13 NOTE — Telephone Encounter (Signed)
Transition of care contact from inpatient facility  Date of Discharge: 05/12/2019 Date of Contact: 05/13/2019 Method of contact: Phone  Attempted to contact patient to discuss transition of care from inpatient admission. Patient did not answer the phone. Voicemail box is full. Unable to leave message.

## 2019-05-16 ENCOUNTER — Telehealth: Payer: Self-pay | Admitting: Nephrology

## 2019-05-16 NOTE — Telephone Encounter (Signed)
Transition of Care Contact from [place]  Date of Discharge: 05/12/19  Date of Contact: 05/16/19 Method of contact: phone  Attempted to contact patient to discuss transition of care from inpatient admission.. Patient did not answer the phone.  Unable to leave message because mailbox is full.  Will  attempt to call them again and if unable to reach will follow up at dialysis.  Jen Mow, PA-C [facility] Pager: [phone]

## 2019-05-22 ENCOUNTER — Inpatient Hospital Stay
Admission: AD | Admit: 2019-05-22 | Payer: Medicare Other | Source: Other Acute Inpatient Hospital | Admitting: Internal Medicine

## 2019-05-25 ENCOUNTER — Other Ambulatory Visit: Payer: Self-pay | Admitting: *Deleted

## 2019-05-25 DIAGNOSIS — K559 Vascular disorder of intestine, unspecified: Secondary | ICD-10-CM

## 2019-05-25 DIAGNOSIS — I739 Peripheral vascular disease, unspecified: Secondary | ICD-10-CM

## 2019-05-31 ENCOUNTER — Ambulatory Visit: Payer: Medicare Other

## 2019-05-31 ENCOUNTER — Ambulatory Visit (HOSPITAL_COMMUNITY): Payer: Medicare Other | Attending: Vascular Surgery

## 2019-06-14 ENCOUNTER — Encounter (HOSPITAL_COMMUNITY): Payer: Self-pay

## 2019-06-14 ENCOUNTER — Other Ambulatory Visit: Payer: Self-pay

## 2019-06-14 ENCOUNTER — Emergency Department (HOSPITAL_COMMUNITY): Payer: Medicare Other

## 2019-06-14 ENCOUNTER — Inpatient Hospital Stay (HOSPITAL_COMMUNITY)
Admission: EM | Admit: 2019-06-14 | Discharge: 2019-06-20 | DRG: 377 | Disposition: A | Discharge status: Home Health | Payer: Medicare Other | Attending: Internal Medicine | Admitting: Internal Medicine

## 2019-06-14 DIAGNOSIS — G9340 Encephalopathy, unspecified: Secondary | ICD-10-CM | POA: Diagnosis not present

## 2019-06-14 DIAGNOSIS — Z955 Presence of coronary angioplasty implant and graft: Secondary | ICD-10-CM

## 2019-06-14 DIAGNOSIS — K623 Rectal prolapse: Secondary | ICD-10-CM | POA: Diagnosis not present

## 2019-06-14 DIAGNOSIS — J449 Chronic obstructive pulmonary disease, unspecified: Secondary | ICD-10-CM

## 2019-06-14 DIAGNOSIS — Z7982 Long term (current) use of aspirin: Secondary | ICD-10-CM

## 2019-06-14 DIAGNOSIS — K269 Duodenal ulcer, unspecified as acute or chronic, without hemorrhage or perforation: Secondary | ICD-10-CM | POA: Diagnosis not present

## 2019-06-14 DIAGNOSIS — K2100 Gastro-esophageal reflux disease with esophagitis, without bleeding: Secondary | ICD-10-CM | POA: Diagnosis present

## 2019-06-14 DIAGNOSIS — E785 Hyperlipidemia, unspecified: Secondary | ICD-10-CM | POA: Diagnosis present

## 2019-06-14 DIAGNOSIS — D62 Acute posthemorrhagic anemia: Secondary | ICD-10-CM | POA: Diagnosis present

## 2019-06-14 DIAGNOSIS — R109 Unspecified abdominal pain: Secondary | ICD-10-CM

## 2019-06-14 DIAGNOSIS — I70262 Atherosclerosis of native arteries of extremities with gangrene, left leg: Secondary | ICD-10-CM | POA: Diagnosis present

## 2019-06-14 DIAGNOSIS — K551 Chronic vascular disorders of intestine: Secondary | ICD-10-CM | POA: Diagnosis present

## 2019-06-14 DIAGNOSIS — I63412 Cerebral infarction due to embolism of left middle cerebral artery: Secondary | ICD-10-CM | POA: Diagnosis not present

## 2019-06-14 DIAGNOSIS — F1410 Cocaine abuse, uncomplicated: Secondary | ICD-10-CM | POA: Diagnosis present

## 2019-06-14 DIAGNOSIS — J4489 Other specified chronic obstructive pulmonary disease: Secondary | ICD-10-CM | POA: Diagnosis present

## 2019-06-14 DIAGNOSIS — Z8719 Personal history of other diseases of the digestive system: Secondary | ICD-10-CM

## 2019-06-14 DIAGNOSIS — I2510 Atherosclerotic heart disease of native coronary artery without angina pectoris: Secondary | ICD-10-CM | POA: Diagnosis not present

## 2019-06-14 DIAGNOSIS — K2980 Duodenitis without bleeding: Secondary | ICD-10-CM | POA: Diagnosis present

## 2019-06-14 DIAGNOSIS — N2581 Secondary hyperparathyroidism of renal origin: Secondary | ICD-10-CM | POA: Diagnosis present

## 2019-06-14 DIAGNOSIS — K920 Hematemesis: Secondary | ICD-10-CM

## 2019-06-14 DIAGNOSIS — K264 Chronic or unspecified duodenal ulcer with hemorrhage: Secondary | ICD-10-CM | POA: Diagnosis present

## 2019-06-14 DIAGNOSIS — I4891 Unspecified atrial fibrillation: Secondary | ICD-10-CM | POA: Diagnosis present

## 2019-06-14 DIAGNOSIS — I10 Essential (primary) hypertension: Secondary | ICD-10-CM | POA: Diagnosis present

## 2019-06-14 DIAGNOSIS — I708 Atherosclerosis of other arteries: Secondary | ICD-10-CM | POA: Diagnosis present

## 2019-06-14 DIAGNOSIS — Z20822 Contact with and (suspected) exposure to covid-19: Secondary | ICD-10-CM | POA: Diagnosis present

## 2019-06-14 DIAGNOSIS — M79671 Pain in right foot: Secondary | ICD-10-CM | POA: Diagnosis present

## 2019-06-14 DIAGNOSIS — I5032 Chronic diastolic (congestive) heart failure: Secondary | ICD-10-CM | POA: Diagnosis present

## 2019-06-14 DIAGNOSIS — M79672 Pain in left foot: Secondary | ICD-10-CM | POA: Diagnosis present

## 2019-06-14 DIAGNOSIS — K922 Gastrointestinal hemorrhage, unspecified: Secondary | ICD-10-CM | POA: Diagnosis present

## 2019-06-14 DIAGNOSIS — I132 Hypertensive heart and chronic kidney disease with heart failure and with stage 5 chronic kidney disease, or end stage renal disease: Secondary | ICD-10-CM | POA: Diagnosis present

## 2019-06-14 DIAGNOSIS — I480 Paroxysmal atrial fibrillation: Secondary | ICD-10-CM | POA: Diagnosis present

## 2019-06-14 DIAGNOSIS — N186 End stage renal disease: Secondary | ICD-10-CM | POA: Diagnosis present

## 2019-06-14 DIAGNOSIS — T39395A Adverse effect of other nonsteroidal anti-inflammatory drugs [NSAID], initial encounter: Secondary | ICD-10-CM | POA: Diagnosis present

## 2019-06-14 DIAGNOSIS — Z66 Do not resuscitate: Secondary | ICD-10-CM | POA: Diagnosis present

## 2019-06-14 DIAGNOSIS — Z79899 Other long term (current) drug therapy: Secondary | ICD-10-CM

## 2019-06-14 DIAGNOSIS — R54 Age-related physical debility: Secondary | ICD-10-CM | POA: Diagnosis present

## 2019-06-14 DIAGNOSIS — K921 Melena: Secondary | ICD-10-CM | POA: Diagnosis present

## 2019-06-14 DIAGNOSIS — E876 Hypokalemia: Secondary | ICD-10-CM | POA: Diagnosis not present

## 2019-06-14 DIAGNOSIS — Z681 Body mass index (BMI) 19 or less, adult: Secondary | ICD-10-CM

## 2019-06-14 DIAGNOSIS — Z90710 Acquired absence of both cervix and uterus: Secondary | ICD-10-CM

## 2019-06-14 DIAGNOSIS — R636 Underweight: Secondary | ICD-10-CM | POA: Diagnosis present

## 2019-06-14 DIAGNOSIS — I6340 Cerebral infarction due to embolism of unspecified cerebral artery: Secondary | ICD-10-CM | POA: Diagnosis not present

## 2019-06-14 DIAGNOSIS — I739 Peripheral vascular disease, unspecified: Secondary | ICD-10-CM | POA: Diagnosis not present

## 2019-06-14 DIAGNOSIS — I340 Nonrheumatic mitral (valve) insufficiency: Secondary | ICD-10-CM | POA: Diagnosis not present

## 2019-06-14 DIAGNOSIS — Z992 Dependence on renal dialysis: Secondary | ICD-10-CM

## 2019-06-14 DIAGNOSIS — Z87442 Personal history of urinary calculi: Secondary | ICD-10-CM

## 2019-06-14 DIAGNOSIS — Z87891 Personal history of nicotine dependence: Secondary | ICD-10-CM

## 2019-06-14 DIAGNOSIS — E875 Hyperkalemia: Secondary | ICD-10-CM | POA: Diagnosis present

## 2019-06-14 DIAGNOSIS — R29700 NIHSS score 0: Secondary | ICD-10-CM | POA: Diagnosis not present

## 2019-06-14 DIAGNOSIS — R531 Weakness: Secondary | ICD-10-CM

## 2019-06-14 DIAGNOSIS — R0602 Shortness of breath: Secondary | ICD-10-CM

## 2019-06-14 DIAGNOSIS — I361 Nonrheumatic tricuspid (valve) insufficiency: Secondary | ICD-10-CM | POA: Diagnosis not present

## 2019-06-14 DIAGNOSIS — I639 Cerebral infarction, unspecified: Secondary | ICD-10-CM | POA: Diagnosis not present

## 2019-06-14 LAB — CBC WITH DIFFERENTIAL/PLATELET
Abs Immature Granulocytes: 0.04 10*3/uL (ref 0.00–0.07)
Basophils Absolute: 0 10*3/uL (ref 0.0–0.1)
Basophils Relative: 0 %
Eosinophils Absolute: 0 10*3/uL (ref 0.0–0.5)
Eosinophils Relative: 1 %
HCT: 23.9 % — ABNORMAL LOW (ref 36.0–46.0)
Hemoglobin: 7.1 g/dL — ABNORMAL LOW (ref 12.0–15.0)
Immature Granulocytes: 1 %
Lymphocytes Relative: 7 %
Lymphs Abs: 0.5 10*3/uL — ABNORMAL LOW (ref 0.7–4.0)
MCH: 27.1 pg (ref 26.0–34.0)
MCHC: 29.7 g/dL — ABNORMAL LOW (ref 30.0–36.0)
MCV: 91.2 fL (ref 80.0–100.0)
Monocytes Absolute: 0.5 10*3/uL (ref 0.1–1.0)
Monocytes Relative: 6 %
Neutro Abs: 6.5 10*3/uL (ref 1.7–7.7)
Neutrophils Relative %: 85 %
Platelets: 202 10*3/uL (ref 150–400)
RBC: 2.62 MIL/uL — ABNORMAL LOW (ref 3.87–5.11)
RDW: 20.7 % — ABNORMAL HIGH (ref 11.5–15.5)
WBC: 7.7 10*3/uL (ref 4.0–10.5)
nRBC: 0 % (ref 0.0–0.2)

## 2019-06-14 LAB — COMPREHENSIVE METABOLIC PANEL
ALT: 13 U/L (ref 0–44)
AST: 16 U/L (ref 15–41)
Albumin: 2.1 g/dL — ABNORMAL LOW (ref 3.5–5.0)
Alkaline Phosphatase: 50 U/L (ref 38–126)
Anion gap: 18 — ABNORMAL HIGH (ref 5–15)
BUN: 80 mg/dL — ABNORMAL HIGH (ref 8–23)
CO2: 18 mmol/L — ABNORMAL LOW (ref 22–32)
Calcium: 7.7 mg/dL — ABNORMAL LOW (ref 8.9–10.3)
Chloride: 101 mmol/L (ref 98–111)
Creatinine, Ser: 6.71 mg/dL — ABNORMAL HIGH (ref 0.44–1.00)
GFR calc Af Amer: 7 mL/min — ABNORMAL LOW (ref >60)
GFR calc non Af Amer: 6 mL/min — ABNORMAL LOW (ref >60)
Glucose, Bld: 147 mg/dL — ABNORMAL HIGH (ref 70–99)
Potassium: 5.5 mmol/L — ABNORMAL HIGH (ref 3.5–5.1)
Sodium: 137 mmol/L (ref 135–145)
Total Bilirubin: 1 mg/dL (ref 0.3–1.2)
Total Protein: 4.4 g/dL — ABNORMAL LOW (ref 6.5–8.1)

## 2019-06-14 LAB — PROTIME-INR
INR: 1.2 (ref 0.8–1.2)
Prothrombin Time: 15 seconds (ref 11.4–15.2)

## 2019-06-14 LAB — LACTIC ACID, PLASMA
Lactic Acid, Venous: 1.8 mmol/L (ref 0.5–1.9)
Lactic Acid, Venous: 3.8 mmol/L (ref 0.5–1.9)

## 2019-06-14 LAB — HEMOGLOBIN AND HEMATOCRIT, BLOOD
HCT: 19.5 % — ABNORMAL LOW (ref 36.0–46.0)
Hemoglobin: 6 g/dL — CL (ref 12.0–15.0)

## 2019-06-14 LAB — PREPARE RBC (CROSSMATCH)

## 2019-06-14 LAB — LIPASE, BLOOD: Lipase: 42 U/L (ref 11–51)

## 2019-06-14 LAB — SARS CORONAVIRUS 2 BY RT PCR (HOSPITAL ORDER, PERFORMED IN ~~LOC~~ HOSPITAL LAB): SARS Coronavirus 2: NEGATIVE

## 2019-06-14 MED ORDER — DILTIAZEM LOAD VIA INFUSION
10.0000 mg | Freq: Once | INTRAVENOUS | Status: AC
  Administered 2019-06-14: 10 mg via INTRAVENOUS
  Filled 2019-06-14: qty 10

## 2019-06-14 MED ORDER — CLOPIDOGREL BISULFATE 75 MG PO TABS: 75.0000 mg | ORAL_TABLET | Freq: Every day | ORAL | Status: DC

## 2019-06-14 MED ORDER — SODIUM CHLORIDE 0.9% IV SOLUTION: Freq: Once | INTRAVENOUS | Status: AC

## 2019-06-14 MED ORDER — DIPHENHYDRAMINE HCL 25 MG PO CAPS
25.0000 mg | ORAL_CAPSULE | Freq: Every evening | ORAL | Status: DC | PRN
  Administered 2019-06-16 – 2019-06-20 (×2): 25 mg via ORAL
  Filled 2019-06-14 (×2): qty 1

## 2019-06-14 MED ORDER — DOCUSATE SODIUM 283 MG RE ENEM: 1.0000 | ENEMA | RECTAL | Status: DC | PRN

## 2019-06-14 MED ORDER — FENTANYL CITRATE (PF) 100 MCG/2ML IJ SOLN
50.0000 ug | Freq: Once | INTRAMUSCULAR | Status: AC
  Administered 2019-06-14: 50 ug via INTRAVENOUS
  Filled 2019-06-14: qty 2

## 2019-06-14 MED ORDER — DICYCLOMINE HCL 10 MG PO CAPS
10.0000 mg | ORAL_CAPSULE | Freq: Every day | ORAL | Status: DC
  Administered 2019-06-14: 10 mg via ORAL
  Filled 2019-06-14: qty 1

## 2019-06-14 MED ORDER — CALCIUM CARBONATE ANTACID 1250 MG/5ML PO SUSP
500.0000 mg | Freq: Four times a day (QID) | ORAL | Status: DC | PRN
  Filled 2019-06-14: qty 5

## 2019-06-14 MED ORDER — PRAVASTATIN SODIUM 40 MG PO TABS
80.0000 mg | ORAL_TABLET | Freq: Every evening | ORAL | Status: DC
  Administered 2019-06-14 – 2019-06-20 (×6): 80 mg via ORAL
  Filled 2019-06-14 (×7): qty 2

## 2019-06-14 MED ORDER — LIDOCAINE HCL (CARDIAC) PF 100 MG/5ML IV SOSY
PREFILLED_SYRINGE | INTRAVENOUS | Status: AC
  Filled 2019-06-14: qty 5

## 2019-06-14 MED ORDER — SORBITOL 70 % SOLN
30.0000 mL | Status: DC | PRN
  Filled 2019-06-14: qty 30

## 2019-06-14 MED ORDER — ALBUTEROL SULFATE (2.5 MG/3ML) 0.083% IN NEBU: 2.5000 mg | INHALATION_SOLUTION | Freq: Four times a day (QID) | RESPIRATORY_TRACT | Status: DC | PRN

## 2019-06-14 MED ORDER — IPRATROPIUM-ALBUTEROL 0.5-2.5 (3) MG/3ML IN SOLN: 3.0000 mL | Freq: Four times a day (QID) | RESPIRATORY_TRACT | Status: DC | PRN

## 2019-06-14 MED ORDER — PANTOPRAZOLE SODIUM 40 MG IV SOLR
40.0000 mg | Freq: Once | INTRAVENOUS | Status: AC
  Administered 2019-06-14: 40 mg via INTRAVENOUS
  Filled 2019-06-14: qty 40

## 2019-06-14 MED ORDER — ONDANSETRON HCL 4 MG PO TABS: 4.0000 mg | ORAL_TABLET | Freq: Four times a day (QID) | ORAL | Status: DC | PRN

## 2019-06-14 MED ORDER — ONDANSETRON HCL 4 MG/2ML IJ SOLN
4.0000 mg | Freq: Four times a day (QID) | INTRAMUSCULAR | Status: DC | PRN
  Administered 2019-06-18 – 2019-06-19 (×2): 4 mg via INTRAVENOUS
  Filled 2019-06-14 (×2): qty 2

## 2019-06-14 MED ORDER — FENTANYL CITRATE (PF) 100 MCG/2ML IJ SOLN
25.0000 ug | INTRAMUSCULAR | Status: DC | PRN
  Administered 2019-06-14 – 2019-06-15 (×4): 50 ug via INTRAVENOUS
  Administered 2019-06-18: 25 ug via INTRAVENOUS
  Administered 2019-06-19 – 2019-06-20 (×5): 50 ug via INTRAVENOUS
  Filled 2019-06-14 (×9): qty 2

## 2019-06-14 MED ORDER — GABAPENTIN 300 MG PO CAPS
300.0000 mg | ORAL_CAPSULE | Freq: Every day | ORAL | Status: DC
  Administered 2019-06-14 – 2019-06-19 (×6): 300 mg via ORAL
  Filled 2019-06-14 (×6): qty 1

## 2019-06-14 MED ORDER — SODIUM CHLORIDE 0.9 % IV SOLN
10.0000 mL/h | Freq: Once | INTRAVENOUS | Status: AC
  Administered 2019-06-15: 10 mL/h via INTRAVENOUS

## 2019-06-14 MED ORDER — MOMETASONE FURO-FORMOTEROL FUM 200-5 MCG/ACT IN AERO
2.0000 | INHALATION_SPRAY | Freq: Two times a day (BID) | RESPIRATORY_TRACT | Status: DC
  Administered 2019-06-15 – 2019-06-20 (×11): 2 via RESPIRATORY_TRACT
  Filled 2019-06-14 (×2): qty 8.8

## 2019-06-14 MED ORDER — ISOSORBIDE MONONITRATE ER 30 MG PO TB24
30.0000 mg | ORAL_TABLET | Freq: Every day | ORAL | Status: DC
  Administered 2019-06-14 – 2019-06-19 (×6): 30 mg via ORAL
  Filled 2019-06-14 (×6): qty 1

## 2019-06-14 MED ORDER — NEPRO/CARBSTEADY PO LIQD
237.0000 mL | Freq: Three times a day (TID) | ORAL | Status: DC | PRN
  Filled 2019-06-14: qty 237

## 2019-06-14 MED ORDER — ALLOPURINOL 100 MG PO TABS
100.0000 mg | ORAL_TABLET | Freq: Every day | ORAL | Status: DC
  Administered 2019-06-15 – 2019-06-20 (×6): 100 mg via ORAL
  Filled 2019-06-14 (×7): qty 1

## 2019-06-14 MED ORDER — ACETAMINOPHEN 325 MG PO TABS: 650.0000 mg | ORAL_TABLET | Freq: Four times a day (QID) | ORAL | Status: DC | PRN

## 2019-06-14 MED ORDER — SODIUM CHLORIDE 0.9 % IV SOLN
8.0000 mg/h | INTRAVENOUS | Status: DC
  Administered 2019-06-14 – 2019-06-15 (×4): 8 mg/h via INTRAVENOUS
  Administered 2019-06-16: 1.6 mg/h via INTRAVENOUS
  Filled 2019-06-14 (×6): qty 80

## 2019-06-14 MED ORDER — DILTIAZEM HCL-DEXTROSE 125-5 MG/125ML-% IV SOLN (PREMIX)
5.0000 mg/h | INTRAVENOUS | Status: DC
  Administered 2019-06-14: 10 mg/h via INTRAVENOUS
  Filled 2019-06-14: qty 125

## 2019-06-14 MED ORDER — FENTANYL CITRATE (PF) 100 MCG/2ML IJ SOLN
50.0000 ug | Freq: Once | INTRAMUSCULAR | Status: AC
  Administered 2019-06-14: 50 ug via INTRAVENOUS

## 2019-06-14 MED ORDER — IOHEXOL 300 MG/ML  SOLN
90.0000 mL | Freq: Once | INTRAMUSCULAR | Status: AC | PRN
  Administered 2019-06-14: 90 mL via INTRAVENOUS

## 2019-06-14 MED ORDER — PANTOPRAZOLE SODIUM 40 MG IV SOLR: 40.0000 mg | Freq: Once | INTRAVENOUS | Status: DC

## 2019-06-14 MED ORDER — HYDROXYZINE HCL 25 MG PO TABS
25.0000 mg | ORAL_TABLET | Freq: Three times a day (TID) | ORAL | Status: DC | PRN
  Administered 2019-06-16 – 2019-06-20 (×2): 25 mg via ORAL
  Filled 2019-06-14: qty 1

## 2019-06-14 MED ORDER — FERRIC CITRATE 1 GM 210 MG(FE) PO TABS
630.0000 mg | ORAL_TABLET | Freq: Three times a day (TID) | ORAL | Status: DC
  Administered 2019-06-15 – 2019-06-20 (×11): 630 mg via ORAL
  Filled 2019-06-14 (×20): qty 3

## 2019-06-14 MED ORDER — PANTOPRAZOLE SODIUM 40 MG IV SOLR: 40.0000 mg | Freq: Two times a day (BID) | INTRAVENOUS | Status: DC

## 2019-06-14 MED ORDER — DILTIAZEM HCL-DEXTROSE 125-5 MG/125ML-% IV SOLN (PREMIX)
5.0000 mg/h | INTRAVENOUS | Status: DC
  Administered 2019-06-14: 5 mg/h via INTRAVENOUS
  Filled 2019-06-14: qty 125

## 2019-06-14 MED ORDER — FENTANYL CITRATE (PF) 100 MCG/2ML IJ SOLN
25.0000 ug | INTRAMUSCULAR | Status: DC | PRN
  Filled 2019-06-14: qty 2

## 2019-06-14 MED ORDER — ACETAMINOPHEN 650 MG RE SUPP: 650.0000 mg | Freq: Four times a day (QID) | RECTAL | Status: DC | PRN

## 2019-06-14 MED ORDER — SUCRALFATE 1 G PO TABS
1.0000 g | ORAL_TABLET | Freq: Four times a day (QID) | ORAL | Status: DC
  Administered 2019-06-14 – 2019-06-20 (×20): 1 g via ORAL
  Filled 2019-06-14 (×22): qty 1

## 2019-06-14 MED ORDER — FENTANYL CITRATE (PF) 100 MCG/2ML IJ SOLN
50.0000 ug | Freq: Once | INTRAMUSCULAR | Status: DC
  Filled 2019-06-14: qty 2

## 2019-06-14 MED ORDER — CAMPHOR-MENTHOL 0.5-0.5 % EX LOTN
1.0000 "application " | TOPICAL_LOTION | Freq: Three times a day (TID) | CUTANEOUS | Status: DC | PRN
  Filled 2019-06-14: qty 222

## 2019-06-14 NOTE — ED Notes (Signed)
Pt's O2 sats observed to be in the upper 80's on room air; pt placed on 2L O2 via [place]; O2 sats now in the 90's

## 2019-06-14 NOTE — ED Triage Notes (Addendum)
Pt from home via ems; this am getting ready to go to dialysis; went to daughters house, began having coffee ground emesis and diarrhea, large clot noted on ground by ems; ems estimates 2-3 liter loss of emesis and stool; has not vomited since; abd pain w/ palpation; c/o bilateral foot pain, purple discoloration noted to several toes; pt takes ASA daily; normally get dialyzed Tu, Th, Sa; last dialyzed Saturday  166/94 HR 112 96% RA CBG 150 RR 30

## 2019-06-14 NOTE — Progress Notes (Signed)
Patient arrived to floor having bloody stools. Patient's rectum prolapsed, per patient this is normal for her. On call GI MD paged. Per GI, page medicine team to address prolapse.  MD Lorin Mercy paged and came to bedside. Rapid response called and came to bedside.

## 2019-06-14 NOTE — ED Notes (Signed)
Dinner ordered 

## 2019-06-14 NOTE — ED Notes (Addendum)
IV team unable to establish additional IV access; Dr. Stark Jock notified

## 2019-06-14 NOTE — ED Provider Notes (Signed)
Patient signed out from Dr. Stark Jock.  End-stage renal disease here with coffee-ground emesis abdominal pain.  Her hemoglobin is dropped.  Blood is being transfused.  Now is gone into A. fib with RVR.  [facility]. Lorin Mercy. Physical Exam  BP (!) 173/109   Pulse (!) 115   Temp 97.8 F (36.6 C) (Oral)   Resp 19   Ht 5\' 2"  (1.575 m)   Wt 44.9 kg   SpO2 95%   BMI 18.11 kg/m   Physical Exam  ED Course/Procedures     Procedures  MDM  Plan is to continue to resuscitate patient and get control of her heart rate.  Blood pressures remained normal to elevated.  Patient started on Cardizem drip.  Lactate not elevated.  Ultimately converted back to sinus.  Tried hospitalist admitting patient.       Hayden Rasmussen, MD 06/15/19 [phone]

## 2019-06-14 NOTE — Progress Notes (Signed)
VAST assessed pt's right arm utilizing [place]. At this time, no appropriate vessels visualized for USGIV. Attempted to place 24g in right hand x2 unsuccessful.

## 2019-06-14 NOTE — Significant Event (Addendum)
Rapid Response Event Note  Overview: Time Called: [place] Time: 1857 Event Type: Other (Comment)(Prolapsed bowel. Pain.)  Initial Focused Assessment: Pt lying in bed. Awake, alert, restless. Pt moaning, yelling in pain. Pt on bed pan- large maroon BM. Initially pt was thought to have acute oxygen desaturation and was placed on 100% NRB, leads adjusted and pt transitioned back to [facility].  VS: BP 125/94, HR 86, RR 20, SpO2 100% on NRB  Dr. Lorin Mercy to bedside to attempt to manually fix prolapse. MD was successful in doing so. Pt now calm, no longer moaning/restless in pain.    Interventions: No intervention from RR RN  Plan of Care (if not transferred): -Notify provider if bowel prolapses again  Call rapid response for additional needs.   Event Summary: Name of Physician Notified: Dr. Lorin Mercy at 1900 Outcome: Stayed in room and stabalized Event End Time: [place]

## 2019-06-14 NOTE — ED Notes (Signed)
IV team at bedside 

## 2019-06-14 NOTE — Progress Notes (Signed)
Lactic acid 3.8.  Dr. Myna Hidalgo notified.  Will recheck lactic acid with next blood draw per MD order.  Jodell Cipro

## 2019-06-14 NOTE — Consult Note (Addendum)
[facility] Consult: 1:33 PM 06/14/2019  LOS: 0 days    Referring Provider: Dr Stark Jock in ED  Primary Care Physician:  Clint Bolder [name], PA-C Primary Gastroenterologist:  Dr Odie Sera     Reason for Consultation:  CGE, melenic stools.     HPI: Christina [name] is a 69 y.o. female.  PMH ESRD on TTS HD since 04/2018.  A. fib, chronic Plavix, 81 ASA.  ASPVD/  S/p CEA.  CAD, NSTEMI, cardiac stenting.  Anxiety/panic disorder, multiple personality disorder.  SBO 12/2017, medical management.  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 Anemia, required blood transfusion in 03/2017 for Hb 6.3. and again in 02/2019.   PUD  with GI bleed ~2009. 08/2017 colonoscopy.  For surveillance of previous polyps.  Study normal. 08/2017 EGD.  Unable to locate report.  8 admissions since late 12/2018.    Admission 2/26 - 03/30/19 w blood loss anemia, abdominal pain, A. fib/RVR.  Required 2 PRBCs during the admission, Hb 5.1 >> 9.1 by discharge.  UDS positive for cocaine.  03/25/2019 CT chest/abdomen/pelvis with angiography showed lots of plaque in thoracic aorta, cardiomegaly with extensive CAD.  Fibrin sheath or small adherent thrombus along right IJ catheter in proximal SVC.  Stable tiny pulmonary nodules.  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  Probable occlusion IMA origin.  Patient at high risk for mesenteric ischemia/mesenteric angina.  Bilateral adrenal masses.  Stable cholelithiasis.  Emphysema, aortic atherosclerosis. 03/26/2019 EGD by Dr. Bryan Lemma.  For evaluation epigastric pain, blood loss anemia, melena, abnormal CT angio.  Findings included non-bleeding LA grade B esophagitis.  2 cm HH.  Gastritis.  Multiple  duodenal bulb ulcers, largest 15 mm, deeply cratered and contain pigmented material.  Ulcer too large for endoscopic intervention so if rebleed would need IR or surgical intervention.  Findings along with the CT angio findings concerning for ischemic etiology.  Biopsies obtained of the area showed peptic injuries, no sprue, dysplasia, carcinoma.  Gastric biopsies showed slight chronic inflammation, no H. pylori, metaplasia, dysplasia, malignancy.  Second duodenum normal Discharged on PPI, Carafate.   Is not a candidate for anticoagulation due to GI bleeding.  Hospitalizations after 03/30/2019 concerned chronic abdominal pain, dyspnea, multifocal pneumonia, A. fib with RVR.  Most recent admission was 4/13 - 4/15  Has been compliant with taking her Protonix twice daily along with Carafate.  Doing well up until this morning.  Had not had abdominal pain, eating well, stools were brown. Returned to ED today after developing acute CGE, dark loose stools at home prior to leaving for dialysis.  Estimated she lost between 2 and 3 L between the emesis and stool.  Upper abd pain and tender on exam.  No dizziness, weakness, presyncope.  Also has chronic painful discoloration in her feet/toes. Has not vomited since about 830 this morning and does not feel nauseous.  Would love to have clear liquids. BPs in the ED have been running high in the 190s to 170s, heart rate  101 to 113. Hgb is 7.1.  On 4/14 it was 7.8, 4/15 it was 10.7. Platelets, coags normal.  LFTs, lipase normal. Potassium 5.5.  Denies familiy hx colorectal cancer, ulcers, anemia. Pt does not drink liquor/beer/wine, no NSAIDs.    Past Medical History:  Diagnosis Date  . Anemia   . Anxiety   . Arthritis   . Asthma   . Atrial fibrillation ([place])   . Chronic kidney disease    Dialysis T/Th/Sa  started in March 2020  . COPD (chronic obstructive pulmonary disease) ([place])   . Coronary artery disease    2 stents  . Depression   . GERD  (gastroesophageal reflux disease)   . Gout   . Headache    migraines  . History of kidney stones   . Hyperlipidemia   . Hypertension   . Pneumonia   . Small bowel obstruction [facility])     Past Surgical History:  Procedure Laterality Date  . ABDOMINAL HYSTERECTOMY    . ABDOMINAL SURGERY     for small bowel obstruction - x 2  . APPENDECTOMY    . AV FISTULA PLACEMENT Left 08/04/2018   Procedure: ARTERIOVENOUS (AV) FISTULA CREATION LEFT ARM;  Surgeon: Waynetta Sandy, MD;  Location: [place];  Service: Vascular;  Laterality: Left;  . BASCILIC VEIN TRANSPOSITION Left 11/24/2018   Procedure: SECOND STAGE BASILIC VEIN TRANSPOSITION LEFT ARM;  Surgeon: Waynetta Sandy, MD;  Location: [place];  Service: Vascular;  Laterality: Left;  . BIOPSY  03/26/2019   Procedure: BIOPSY;  Surgeon: Lavena Bullion, DO;  Location: [place];  Service: Gastroenterology;;  . CARDIAC CATHETERIZATION    . CORONARY ANGIOPLASTY  ?2003/2004  . ESOPHAGOGASTRODUODENOSCOPY (EGD) WITH PROPOFOL N/A 03/26/2019   Procedure: ESOPHAGOGASTRODUODENOSCOPY (EGD) WITH PROPOFOL;  Surgeon: Lavena Bullion, DO;  Location: [place];  Service: Gastroenterology;  Laterality: N/A;  . FACIAL RECONSTRUCTION SURGERY     x 2  . HERNIA REPAIR    . PERIPHERAL VASCULAR INTERVENTION  04/27/2019   Procedure: PERIPHERAL VASCULAR INTERVENTION;  Surgeon: Marty Heck, MD;  Location: [place] CV LAB;  Service: Cardiovascular;;  SMA  . VISCERAL ANGIOGRAPHY N/A 04/27/2019   Procedure: MESENTERIC ANGIOGRAPHY;  Surgeon: Marty Heck, MD;  Location: [place] CV [facility];  Service: Cardiovascular;  Laterality: N/A;    Prior to Admission medications   Medication Sig Start Date End Date Taking? Authorizing Provider  aspirin 81 MG EC tablet Take 81 mg by mouth daily. 05/12/19  Yes [provider]  furosemide (LASIX) 40 MG tablet Take 40 mg by mouth 3 (three) times a week. 05/26/19  Yes [provider]  albuterol (PROVENTIL) (2.5 MG/3ML) 0.083% nebulizer solution Take 2.5 mg by nebulization every 6 (six) hours as needed for wheezing or shortness of breath.    [provider]  allopurinol (ZYLOPRIM) 100 MG tablet Take 100 mg by mouth daily. 02/18/18   [provider]  amLODipine (NORVASC) 10 MG tablet Take 10 mg by mouth at bedtime.     [provider]  budesonide-formoterol (SYMBICORT) 160-4.5 MCG/ACT inhaler Inhale 2 puffs into the lungs every 4 (four) hours as needed (shortness of breath/wheezing).    [provider]  calcitRIOL (ROCALTROL) 0.5 MCG capsule Take 0.5 mcg by mouth 2 (two) times a week.    [provider]  Cholecalciferol (VITAMIN D3) 50 MCG (2000 UT) capsule Take 2,000 Units by mouth daily.     [provider]  clobetasol (TEMOVATE) 0.05 %  external solution Apply 1 application topically daily as needed (psoriasis).  04/22/19   [provider]  clopidogrel (PLAVIX) 75 MG tablet Take 75 mg by mouth daily. 06/09/19   [provider]  dicyclomine (BENTYL) 10 MG capsule Take 20 mg by mouth at bedtime.  08/27/17   [provider]  diltiazem (CARDIZEM CD) 120 MG 24 hr capsule Take 120 mg by mouth at bedtime. 05/26/19   [provider]  diphenhydrAMINE (BENADRYL) 25 MG tablet Take 50 mg by mouth at bedtime.    [provider]  docusate sodium (COLACE) 100 MG capsule Take 1 capsule (100 mg total) by mouth 2 (two) times daily. Patient taking differently: Take 300 mg by mouth daily as needed (constipation).  08/11/18   Hongalgi, Lenis Dickinson, MD  ferric citrate (AURYXIA) 1 GM 210 MG(Fe) tablet Take 420 mg by mouth daily.     [provider]  gabapentin (NEURONTIN) 300 MG capsule Take 1 capsule (300 mg total) by mouth at bedtime. 02/23/19   Mercy Riding, MD  Ipratropium-Albuterol (COMBIVENT) 20-100 MCG/ACT AERS respimat Inhale 2 puffs into the lungs 4 (four) times daily as needed for wheezing.  09/11/17   [provider]  isosorbide mononitrate (IMDUR) 30 MG 24 hr tablet Take 30 mg by mouth at bedtime.  08/17/18   [provider]  lidocaine-prilocaine (EMLA) cream Apply 1 application topically daily as needed (pain at the site of port access).  09/03/18   [provider]  metoprolol tartrate (LOPRESSOR) 100 MG tablet Take 1 tablet (100 mg total) by mouth 2 (two) times daily. 01/26/19   Little Ishikawa, MD  nitroGLYCERIN (NITROSTAT) 0.4 MG SL tablet Place 0.4 mg under the tongue every 5 (five) minutes as needed for chest pain.  09/18/16   [provider]  ondansetron (ZOFRAN) 4 MG tablet Take 4 mg by mouth 3 (three) times daily as needed for nausea. 09/14/18   [provider]  pantoprazole (PROTONIX) 40 MG tablet Take 1 tablet (40 mg total) by mouth 2 (two) times daily. 04/04/19   Shelly Coss, MD  polyethylene glycol (MIRALAX / GLYCOLAX) 17 g packet Take 17 g by mouth daily as needed for mild constipation. Patient taking differently: Take 17 g by mouth 2 (two) times daily as needed for mild constipation.  03/30/19   Mercy Riding, MD  sennosides-docusate sodium (SENOKOT-S) 8.6-50 MG tablet Take 1 tablet by mouth as needed for constipation.     [provider]  sucralfate (CARAFATE) 1 GM/10ML suspension Take 10 mLs (1 g total) by mouth every 6 (six) hours. 03/30/19 05/29/19  Mercy Riding, MD    Scheduled Meds:  Infusions: . sodium chloride     PRN Meds:    Allergies as of 06/14/2019 - Review Complete 06/14/2019  Allergen Reaction Noted  . Cyclobenzaprine Anaphylaxis and Other (See Comments) 04/09/2015  . Morphine Anaphylaxis 04/09/2015  . Penicillins Shortness Of Breath, Swelling, and Palpitations 04/09/2015  . Ambien [zolpidem] Other (See Comments) 02/23/2019  . Codeine Itching and Rash 04/09/2015  . Hydromorphone Other (See Comments) 05/29/2015    Family History  Problem Relation Age of Onset  . Ulcers Sister        sick a  long time, improved after surgery    Social History   Socioeconomic History  . Marital status: Single    Spouse name: Not on file  . Number of children: Not on file  . Years of education: Not on file  .  Highest education level: Not on file  Occupational History  . Occupation: retired  Tobacco Use  . Smoking status: Former Smoker    Packs/day: 0.50    Years: 50.00    Pack years: 25.00    Quit date: 2019    Years since quitting: 2.3  . Smokeless tobacco: Never Used  Substance and Sexual Activity  . Alcohol use: Not Currently    Comment: quit 2019 - "I was a drunk"  . Drug use: Not Currently    Types: Marijuana    Comment: h/o drug use - "I was raised in the 60s"; last use 18 months ago; smokes marijuana periodically  . Sexual activity: Not on file  Other Topics Concern  . Not on file  Social History Narrative  . Not on file   Social Determinants of Health   Financial Resource Strain:   . Difficulty of Paying Living Expenses:   Food Insecurity:   . Worried About Charity fundraiser in the Last Year:   . Arboriculturist in the Last Year:   Transportation Needs:   . Film/video editor (Medical):   Marland Kitchen Lack of Transportation (Non-Medical):   Physical Activity:   . Days of Exercise per Week:   . Minutes of Exercise per Session:   Stress:   . Feeling of Stress :   Social Connections:   . Frequency of Communication with Friends and Family:   . Frequency of Social Gatherings with Friends and Family:   . Attends Religious Services:   . Active Member of Clubs or Organizations:   . Attends Archivist Meetings:   Marland Kitchen Marital Status:   Intimate Partner Violence:   . Fear of Current or Ex-Partner:   . Emotionally Abused:   Marland Kitchen Physically Abused:   . Sexually Abused:     REVIEW OF SYSTEMS: Constitutional: No profound weakness or fatigue.  Weight stable at about 98 # ENT:  No nose bleeds Pulm: No shortness of breath, no cough. CV:  No palpitations, no LE edema.   No angina. GU:  No hematuria, no frequency.  Still makes urine. GI: See HPI. Heme: No unusual or excessive bleeding/bruising. Transfusions: Last transfusions were during the hospitalization in late February 2021. Neuro:  No headaches, no peripheral tingling or numbness.  No syncope, no seizures. Derm:  No itching, no rash or sores.  Endocrine:  No sweats or chills.  No polyuria or dysuria Immunization: Not queried. Travel:  None beyond local counties in last few months.    PHYSICAL EXAM: Vital signs in last 24 hours: Vitals:   06/14/19 1246 06/14/19 1330  BP: (!) 175/82 (!) 171/82  Pulse: (!) 101 (!) 102  Resp: 16 16  Temp: 98.5 F (36.9 C) 97.8 F (36.6 C)  SpO2:  99%   Wt Readings from Last 3 Encounters:  06/14/19 44.9 kg  05/12/19 41.9 kg  04/29/19 40.8 kg    General: Patient looks like she has had a hard life, her skin is, leathery and she speaks with a raspy voice but she is alert, sitting up, comfortable and does not look acutely ill or toxic. Head: No signs of head trauma.  No facial asymmetry or swelling Eyes: No conjunctival pallor.  No scleral icterus.  EOMI Ears: Not hard of hearing Nose: No congestion or discharge Mouth: Poor dentition, the bulk of her teeth are absent.  Tongue is midline.  Mucosa is moist, pink, clear. Neck: No JVD, no masses, no thyromegaly Lungs:  Diminished but clear bilaterally.  Raspy vocal quality.  No dyspnea or cough. Heart: Irregularly irregular.  Rate in the low 100s.  No MRG. Abdomen: Soft.  Mild to moderate epigastric discomfort without guarding, rebound.  Bowel sounds active.  Not distended.  Midline incisional scar well-healed..   Rectal: Did not repeat DRE performed by Dr. Lanny Cramp Musc/Skeltl: No joint redness, swelling or gross deformities. Extremities: There is no peripheral edema but both her feet are cool to the touch and there is left greater than right ischemic-looking ulcers on some of the toes which are painful to the  touch. Neurologic: Alert.  Good historian.  Oriented x3.  Moves all 4 limbs with grossly intact strength. Skin: Sores on the toes Nodes: No cervical adenopathy Psych: Animated affect, cooperative, fluid speech.  Intake/Output from previous day: No intake/output data recorded. Intake/Output this shift: No intake/output data recorded.  LAB RESULTS: Recent Labs    06/14/19 1010  WBC 7.7  HGB 7.1*  HCT 23.9*  PLT 202   BMET Lab Results  Component Value Date   NA 137 06/14/2019   NA 135 05/12/2019   NA 135 05/11/2019   K 5.5 (H) 06/14/2019   K 3.7 05/12/2019   K 5.2 (H) 05/11/2019   CL 101 06/14/2019   CL 95 (L) 05/12/2019   CL 101 05/11/2019   CO2 18 (L) 06/14/2019   CO2 28 05/12/2019   CO2 20 (L) 05/11/2019   GLUCOSE 147 (H) 06/14/2019   GLUCOSE 72 05/12/2019   GLUCOSE 119 (H) 05/11/2019   BUN 80 (H) 06/14/2019   BUN 28 (H) 05/12/2019   BUN 91 (H) 05/11/2019   CREATININE 6.71 (H) 06/14/2019   CREATININE 3.68 (H) 05/12/2019   CREATININE 6.32 (H) 05/11/2019   CALCIUM 7.7 (L) 06/14/2019   CALCIUM 8.4 (L) 05/12/2019   CALCIUM 8.6 (L) 05/11/2019   LFT Recent Labs    06/14/19 1010  PROT 4.4*  ALBUMIN 2.1*  AST 16  ALT 13  ALKPHOS 50  BILITOT 1.0   PT/INR Lab Results  Component Value Date   INR 1.2 06/14/2019   INR 1.1 03/25/2019   INR 0.9 01/26/2019   Hepatitis Panel No results for input(s): HEPBSAG, HCVAB, HEPAIGM, HEPBIGM in the last 72 hours. C-Diff No components found for: CDIFF Lipase     Component Value Date/Time   LIPASE 42 06/14/2019 1010    Drugs of Abuse     Component Value Date/Time   LABOPIA NONE DETECTED 04/23/2019 1440   COCAINSCRNUR NONE DETECTED 04/23/2019 1440   LABBENZ NONE DETECTED 04/23/2019 1440   AMPHETMU NONE DETECTED 04/23/2019 1440   THCU NONE DETECTED 04/23/2019 1440   LABBARB NONE DETECTED 04/23/2019 1440     RADIOLOGY STUDIES: DG Chest Port 1 View  Result Date: 06/14/2019 CLINICAL DATA:  Weakness. Dialysis  patient. EXAM: PORTABLE CHEST 1 VIEW COMPARISON:  Chest x-ray 05/22/2019 FINDINGS: The right IJ dialysis catheter is in good position, unchanged. The cardiac silhouette, mediastinal and hilar contours are within normal limits. Mild tortuosity and calcification of the thoracic aorta. No acute pulmonary findings. No pleural effusions. No pulmonary lesions. IMPRESSION: No acute cardiopulmonary findings. Electronically Signed   By: Marijo Sanes M.D.   On: 06/14/2019 10:49     IMPRESSION:   *    CGE in pt w GI bleed, blood loss anemia in late 02/2019 at EGD then had multiple ulcers in the duodenal bulb, gastritis, moderate esophagitis, small hiatal hernia.  Pathology from duodenum and stomach did not  confirm suspicion of ischemic etiology to the duodenal ulcers but confirmed chronic inflammation. Has complied w bid Protonix and Carafate.  *     Blood loss anemia.  1 of 1 U PRBC transfusing now.   Auryxia (ferric citrate) daily.  *     ESRD.  On TTS HD schedule.  Believe that she will undergo her dialysis session this afternoon.  *     Peripheral vascular disease.  On chronic Plavix, 81 ASA, last doses were 5/17.  *     A. fib.  Hx RVR.  Not on anticoagulation due to GI bleeding hx. Current A. fib with rates as high as 113.  *   Hyperkalemia.  *     Hypertension.    PLAN:     *   PRBC x 1 transfusing now.  Hgb, crit at 6 PM, CBC in AM.      *   Will need EGD, timing TBD as she took Plavix as recently as yesterday  *   She received 40 mg IV Protonix at 11 AM this morning.  I ordered Protonix 40 IV every 12 hours to start this evening.  *    Allow clear liquids.     Azucena Freed  06/14/2019, 1:33 PM Phone [phone]  ________________________________________________________________________  Velora Heckler GI MD note:  I personally examined the patient, reviewed the data and agree with the assessment and plan described above.  Recurrent bleeding is likely from the previously noted deep  duodenal ulcers (EGD 02/2019) ni the setting of chronic blood thinner.  For now need to transfuse blood as needed and hold her blood thinner. Will likely repeat EGD in the next few days after further plavix washout. IV PPI BID.   Owens Loffler, MD [facility] Pager [phone]

## 2019-06-14 NOTE — ED Notes (Signed)
Pt noted to be in Afib RVR; Dr. Stark Jock and Dr. Melina Copa notified; both at bedside

## 2019-06-14 NOTE — ED Notes (Signed)
Unable to finish unit of blood (approx 50 mL left in bag) due to ongoing IV issues; stopped just prior to 4 hours after blood issued; Dr. Stark Jock and Dr. Melina Copa aware

## 2019-06-14 NOTE — Progress Notes (Signed)
VAST consult received to obtain 2nd IV access. Pt with one access in place. Pt due for one dose of Protonix and will be receiving blood transfusion. Pt is a dialysis patient with left arm restricted. Unit RN uncertain if current IV will last through blood transfusion. VAST RN advised that vasculature will be assessed.

## 2019-06-14 NOTE — Consult Note (Signed)
ASSESSMENT & PLAN   S/P SMA STENT: By CT angiogram, the patient's stent is patent.  The patient does not have consistent postprandial abdominal pain or weight loss.  She presents with a GI bleed and also atrial fibrillation with a rapid ventricular response.  Given that the stent is patent currently there is no further work-up necessary.  She can be followed up with a mesenteric duplex in the office with Dr. Carlis Abbott.  PERIPHERAL VASCULAR DISEASE: The patient has evidence of infrainguinal arterial occlusive disease bilaterally but currently this is a secondary issue.  She currently does not have any wounds on her feet.  END-STAGE RENAL DISEASE: She does have a functioning left upper arm fistula but apparently they are using her right IJ tunneled dialysis catheter.  She dialyzes on Tuesdays Thursdays and Saturdays.  This patient had a first stage basilic vein fistula performed on 08/04/2018 followed by a transposition on 11/24/2018.  The fistula has a good thrill.  REASON FOR CONSULT:    Abdominal pain.  The consult is requested by Dr. Karmen Bongo.   HPI:   Christina [name] is a 69 y.o. female who developed hematemesis and hematochezia both at 830 this morning.  She states that she had significant bright red bleeding from above and below.  She also complained of abdominal pain.  She presented to the emergency department and has atrial fibrillation with a rapid ventricular response.  This is being managed by the emergency department. Given her abdominal pain and history of a previous SMA stent vascular surgery was consulted.  The patient had presented with upper abdominal pain and was found to have a duodenal ulcer.  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  Dr. Carlis Abbott performed mesenteric arteriography on 04/27/2019.  He performed angioplasty and stenting of the superior mesenteric artery with an excellent result.  On my  history the patient occasionally gets some pain after eating but does not have consistent postprandial abdominal pain.  She states that her weight goes up and down.  She had presented with abdominal pain but currently she is not having significant abdominal pain and is requesting dinner.  She is hungry.  Risk factors for peripheral vascular disease include hypertension and hypercholesterolemia.  She denies any history of diabetes, family history of premature cardiovascular disease.  She quit smoking in 2019.  She tells me that she has had 4 previous heart attacks.  She also has a history of congestive heart failure.  She has a history of atrial fibrillation.  In reviewing her meds it looks like she was on aspirin and Plavix but I do not see that she was on Coumadin, Xarelto, or Eliquis.  Past Medical History:  Diagnosis Date  . Anemia   . Anxiety   . Arthritis   . Asthma   . Atrial fibrillation ([place])   . Chronic kidney disease    Dialysis T/Th/Sa  started in March 2020  . COPD (chronic obstructive pulmonary disease) ([place])   . Coronary artery disease    2 stents  . Depression   . GERD (gastroesophageal reflux disease)   . Gout   . Headache    migraines  . History of kidney stones   . Hyperlipidemia   . Hypertension   . Pneumonia   . Small bowel obstruction ([facility])     Family History  Problem Relation Age of Onset  . Ulcers Sister  sick a long time, improved after surgery    SOCIAL HISTORY: Social History   Tobacco Use  . Smoking status: Former Smoker    Packs/day: 0.50    Years: 50.00    Pack years: 25.00    Quit date: 2019    Years since quitting: 2.3  . Smokeless tobacco: Never Used  Substance Use Topics  . Alcohol use: Not Currently    Comment: quit 2019 - "I was a drunk"    Allergies  Allergen Reactions  . Betaine Anaphylaxis  . Cyclobenzaprine Anaphylaxis and Other (See Comments)    "stopped heart"   . Morphine Anaphylaxis    "stopped heart"  .  Penicillins Shortness Of Breath, Swelling and Palpitations    Did it involve swelling of the face/tongue/throat, SOB, or low BP? Yes Did it involve sudden or severe rash/hives, skin peeling, or any reaction on the inside of your mouth or nose? No Did you need to seek medical attention at a hospital or doctor's office? No When did it last happen?years  If all above answers are "NO", may proceed with cephalosporin use.   . Ambien [Zolpidem] Other (See Comments)    Severe confusion  . Codeine Itching and Rash  . Hydromorphone Other (See Comments)    If administered quickly, felt like hand was "exploding"     Current Facility-Administered Medications  Medication Dose Route Frequency Provider Last Rate Last Admin  . 0.9 %  sodium chloride infusion  10 mL/hr Intravenous Once Karmen Bongo, MD      . acetaminophen (TYLENOL) tablet 650 mg  650 mg Oral Q6H PRN Karmen Bongo, MD       Or  . acetaminophen (TYLENOL) suppository 650 mg  650 mg Rectal Q6H PRN Karmen Bongo, MD      . albuterol (PROVENTIL) (2.5 MG/3ML) 0.083% nebulizer solution 2.5 mg  2.5 mg Nebulization Q6H PRN Karmen Bongo, MD      . Derrill Memo ON 06/15/2019] allopurinol (ZYLOPRIM) tablet 100 mg  100 mg Oral Daily Karmen Bongo, MD      . calcium carbonate (dosed in mg elemental calcium) suspension 500 mg of elemental calcium  500 mg of elemental calcium Oral Q6H PRN Karmen Bongo, MD      . camphor-menthol [facility]) lotion 1 application  1 application Topical Y8M PRN Karmen Bongo, MD       And  . hydrOXYzine (ATARAX/VISTARIL) tablet 25 mg  25 mg Oral Q8H PRN Karmen Bongo, MD      . Derrill Memo ON 06/15/2019] clopidogrel (PLAVIX) tablet 75 mg  75 mg Oral Daily Karmen Bongo, MD      . dicyclomine (BENTYL) capsule 10 mg  10 mg Oral Ivery Quale, MD      . diltiazem (CARDIZEM) 125 mg in dextrose 5% 125 mL (1 mg/mL) infusion  5-15 mg/hr Intravenous Titrated Karmen Bongo, MD 10 mL/hr at 06/14/19 1701 10 mg/hr at  06/14/19 1701  . diphenhydrAMINE (BENADRYL) capsule 25 mg  25 mg Oral QHS PRN Karmen Bongo, MD      . docusate sodium [facility]) enema 283 mg  1 enema Rectal PRN Karmen Bongo, MD      . feeding supplement (NEPRO CARB STEADY) liquid 237 mL  237 mL Oral TID PRN Karmen Bongo, MD      . fentaNYL (SUBLIMAZE) injection 50 mcg  50 mcg Intravenous Once Karmen Bongo, MD      . ferric citrate (AURYXIA) tablet 630 mg  630 mg Oral TID WC Karmen Bongo, MD      .  gabapentin (NEURONTIN) capsule 300 mg  300 mg Oral QHS Karmen Bongo, MD      . ipratropium-albuterol (DUONEB) 0.5-2.5 (3) MG/3ML nebulizer solution 3 mL  3 mL Inhalation QID PRN Karmen Bongo, MD      . isosorbide mononitrate (IMDUR) 24 hr tablet 30 mg  30 mg Oral QHS Karmen Bongo, MD      . mometasone-formoterol [facility]) 200-5 MCG/ACT inhaler 2 puff  2 puff Inhalation BID Karmen Bongo, MD      . ondansetron [facility]) tablet 4 mg  4 mg Oral Q6H PRN Karmen Bongo, MD       Or  . ondansetron [facility]) injection 4 mg  4 mg Intravenous Q6H PRN Karmen Bongo, MD      . pantoprazole (PROTONIX) injection 40 mg  40 mg Intravenous Lillia Mountain, MD      . pravastatin (PRAVACHOL) tablet 80 mg  80 mg Oral QPM Karmen Bongo, MD   80 mg at 06/14/19 1704  . sorbitol 70 % solution 30 mL  30 mL Oral PRN Karmen Bongo, MD      . sucralfate (CARAFATE) tablet 1 g  1 g Oral Q6H Karmen Bongo, MD   1 g at 06/14/19 1704   Current Outpatient Medications  Medication Sig Dispense Refill  . albuterol (PROVENTIL) (2.5 MG/3ML) 0.083% nebulizer solution Take 2.5 mg by nebulization every 6 (six) hours as needed for wheezing or shortness of breath.    . allopurinol (ZYLOPRIM) 100 MG tablet Take 100 mg by mouth daily.    Marland Kitchen aspirin 81 MG EC tablet Take 81 mg by mouth daily.    . budesonide-formoterol (SYMBICORT) 160-4.5 MCG/ACT inhaler Inhale 2 puffs into the lungs every 4 (four) hours as needed (shortness of breath/wheezing).    .  Cholecalciferol (VITAMIN D3) 50 MCG (2000 UT) capsule Take 2,000 Units by mouth daily.     . clobetasol (TEMOVATE) 0.05 % external solution Apply 1 application topically daily as needed (psoriasis).     . clopidogrel (PLAVIX) 75 MG tablet Take 75 mg by mouth daily.    Marland Kitchen dicyclomine (BENTYL) 10 MG capsule Take 10 mg by mouth at bedtime.     . diphenhydrAMINE (BENADRYL) 25 MG tablet Take 25 mg by mouth at bedtime as needed for sleep.     Marland Kitchen docusate sodium (COLACE) 100 MG capsule Take 1 capsule (100 mg total) by mouth 2 (two) times daily. (Patient taking differently: Take 300 mg by mouth daily as needed (constipation). ) 30 capsule 0  . ferric citrate (AURYXIA) 1 GM 210 MG(Fe) tablet Take 630 mg by mouth 3 (three) times daily with meals. 2 tablets with a snack = 420mg     . furosemide (LASIX) 40 MG tablet Take 40 mg by mouth 3 (three) times a week.    . gabapentin (NEURONTIN) 300 MG capsule Take 1 capsule (300 mg total) by mouth at bedtime.    . Ipratropium-Albuterol (COMBIVENT) 20-100 MCG/ACT AERS respimat Inhale 2 puffs into the lungs 4 (four) times daily as needed for wheezing.    . isosorbide mononitrate (IMDUR) 30 MG 24 hr tablet Take 30 mg by mouth at bedtime.     . lidocaine-prilocaine (EMLA) cream Apply 1 application topically daily as needed (pain at the site of port access).     . metoprolol tartrate (LOPRESSOR) 100 MG tablet Take 1 tablet (100 mg total) by mouth 2 (two) times daily. 60 tablet 0  . nitroGLYCERIN (NITROSTAT) 0.4 MG SL tablet Place 0.4 mg under the  tongue every 5 (five) minutes as needed for chest pain.     Marland Kitchen ondansetron (ZOFRAN) 4 MG tablet Take 4 mg by mouth 3 (three) times daily as needed for nausea.    . pantoprazole (PROTONIX) 40 MG tablet Take 1 tablet (40 mg total) by mouth 2 (two) times daily. 90 tablet 1  . polyethylene glycol (MIRALAX / GLYCOLAX) 17 g packet Take 17 g by mouth daily as needed for mild constipation. (Patient taking differently: Take 17 g by mouth 2 (two)  times daily as needed for mild constipation. ) 14 each 0  . pravastatin (PRAVACHOL) 80 MG tablet Take 80 mg by mouth every evening.    . sennosides-docusate sodium (SENOKOT-S) 8.6-50 MG tablet Take 1 tablet by mouth as needed for constipation.     . sucralfate (CARAFATE) 1 g tablet Take 1 g by mouth every 6 (six) hours.    Marland Kitchen amLODipine (NORVASC) 10 MG tablet Take 10 mg by mouth at bedtime.     . sucralfate (CARAFATE) 1 GM/10ML suspension Take 10 mLs (1 g total) by mouth every 6 (six) hours. 1260 mL 1    REVIEW OF SYSTEMS:  [X]  denotes positive finding, [ ]  denotes negative finding Cardiac  Comments:  Chest pain or chest pressure:    Shortness of breath upon exertion:    Short of breath when lying flat:    Irregular heart rhythm:        Vascular    Pain in calf, thigh, or hip brought on by ambulation:    Pain in feet at night that wakes you up from your sleep:     Blood clot in your veins:    Leg swelling:         Pulmonary    Oxygen at home:    Productive cough:     Wheezing:         Neurologic    Sudden weakness in arms or legs:     Sudden numbness in arms or legs:     Sudden onset of difficulty speaking or slurred speech:    Temporary loss of vision in one eye:     Problems with dizziness:         Gastrointestinal    Blood in stool:  x   Vomited blood:  x       Genitourinary    Burning when urinating:     Blood in urine:        Psychiatric    Major depression:         Hematologic    Bleeding problems:    Problems with blood clotting too easily:        Skin    Rashes or ulcers:        Constitutional    Fever or chills:    -  PHYSICAL EXAM:   Vitals:   06/14/19 1630 06/14/19 1645 06/14/19 1652 06/14/19 1700  BP: 122/71 (!) 119/93  125/77  Pulse:   (!) 128   Resp: 15 (!) 30 18 18   Temp:      TempSrc:      SpO2:   100%   Weight:      Height:       Body mass index is 18.11 kg/m. GENERAL: The patient is a well-nourished female, in no acute distress.  The vital signs are documented above. CARDIAC: There is a regular rate and rhythm.  VASCULAR: I do not detect carotid bruits. She has palpable femoral pulses. I cannot  palpate pedal pulses. She has no significant lower extremity swelling. She has a good thrill in her left upper arm fistula. PULMONARY: There is good air exchange bilaterally without wheezing or rales. ABDOMEN: Soft and non-tender with normal pitched bowel sounds.  She tenses her abdomen when I try to evaluate her aneurysm.  (She has a small abdominal aortic aneurysm) MUSCULOSKELETAL: There are no major deformities. NEUROLOGIC: No focal weakness or paresthesias are detected. SKIN: There are no ulcers or rashes noted. PSYCHIATRIC: The patient has a normal affect.  DATA:    Lab Results  Component Value Date   WBC 7.7 06/14/2019   HGB 7.1 (L) 06/14/2019   HCT 23.9 (L) 06/14/2019   MCV 91.2 06/14/2019   PLT 202 06/14/2019   Lab Results  Component Value Date   NA 137 06/14/2019   K 5.5 (H) 06/14/2019   CL 101 06/14/2019   CO2 18 (L) 06/14/2019   Lab Results  Component Value Date   CREATININE 6.71 (H) 06/14/2019   Lab Results  Component Value Date   INR 1.2 06/14/2019   INR 1.1 03/25/2019   INR 0.9 01/26/2019   Lab Results  Component Value Date   HGBA1C 5.5 02/04/2019    Deitra [facility] [place]: [phone] Office: [phone]

## 2019-06-14 NOTE — ED Notes (Signed)
External Jugular placed on R side per Dr. Stark Jock verbal order

## 2019-06-14 NOTE — ED Notes (Signed)
Vascular at bedside

## 2019-06-14 NOTE — ED Provider Notes (Addendum)
[facility] EMERGENCY DEPARTMENT Provider Note   CSN: [id number] Arrival date & time: 06/14/19  4854     History No chief complaint on file.   Christina [name] is a 69 y.o. female.  Patient is a 69 year old female with extensive past medical history including end-stage renal disease on hemodialysis, COPD, atrial fibrillation, hypertension, asthma. Patient takes aspirin but denies stronger anticoagulant use. She presents today for evaluation of hematemesis and hematochezia. Patient went to bed last night feeling well, then woke up this morning and "everything went to hell". She states that she began vomiting up coffee-ground material followed by passing a significant quantity of maroon-colored, bloody stool. She is describing generalized abdominal pain. Patient was due for dialysis this morning, however came here due to the above issues. She denies any aggravating or alleviating factors.  The history is provided by the patient.       Past Medical History:  Diagnosis Date  . Anemia   . Anxiety   . Arthritis   . Asthma   . Atrial fibrillation ([place])   . Chronic kidney disease    Dialysis T/Th/Sa  started in March 2020  . COPD (chronic obstructive pulmonary disease) ([place])   . Coronary artery disease    2 stents  . Depression   . GERD (gastroesophageal reflux disease)   . Gout   . Headache    migraines  . History of kidney stones   . Hyperlipidemia   . Hypertension   . Pneumonia   . Small bowel obstruction [facility])     Patient Active Problem List   Diagnosis Date Noted  . Respiratory failure with hypoxia ([place]) 05/10/2019  . Pneumonia 05/10/2019  . 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 Duodenal ulcer perforation ([place]) 04/23/2019  . Cocaine abuse ([place]) 04/23/2019  . Acute gastric ulcer without hemorrhage or perforation   . Hematemesis 04/04/2019  . Atrial fibrillation with RVR ([place])   . Duodenal ulcer   . Gastritis and gastroduodenitis     . Acute blood loss anemia   . 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 RUQ pain   . Anemia due to chronic kidney disease, on chronic dialysis ([place])   . AF (paroxysmal atrial fibrillation) ([place])   . Chest pain 03/19/2019  . Hyperkalemia 02/28/2019  . Uremia 02/27/2019  . Volume overload 02/21/2019  . Abdominal pain 02/21/2019  . Non-compliance with renal dialysis ([place]) 02/21/2019  . Respiratory failure ([place]) 02/04/2019  . Acute and chr resp failure, unsp w hypoxia or hypercapnia ([place])   . Afib ([place]) 01/25/2019  . SBO (small bowel obstruction) ([place]) 08/10/2018  . Anemia due to chronic blood loss   . Heme positive stool   . Platelet inhibition due to Plavix   . Acute GI bleeding 05/06/2018  . ESRD (end stage renal disease) ([place]) 05/06/2018  . Anxiety 05/06/2018  . Bipolar affective ([place]) 05/06/2018  . CAD (coronary artery disease), native coronary artery 05/06/2018  . COPD with chronic bronchitis ([place]) 05/06/2018  . CVA (cerebral vascular accident) ([place]) 05/06/2018  . Essential hypertension 05/06/2018  . Hyperlipidemia 05/06/2018    Past Surgical History:  Procedure Laterality Date  . ABDOMINAL HYSTERECTOMY    . ABDOMINAL SURGERY     for small bowel obstruction - x 2  . APPENDECTOMY    . AV FISTULA PLACEMENT Left 08/04/2018   Procedure: ARTERIOVENOUS (AV) FISTULA CREATION LEFT ARM;  Surgeon: Waynetta Sandy, MD;  Location: [place];  Service: Vascular;  Laterality: Left;  . BASCILIC VEIN TRANSPOSITION Left 11/24/2018   Procedure: SECOND STAGE BASILIC VEIN TRANSPOSITION LEFT ARM;  Surgeon: Waynetta Sandy, MD;  Location: [place];  Service: Vascular;  Laterality: Left;  . BIOPSY  03/26/2019   Procedure: BIOPSY;  Surgeon: Lavena Bullion, DO;  Location: [place];  Service: Gastroenterology;;  . CARDIAC CATHETERIZATION    . CORONARY ANGIOPLASTY  ?2003/2004  . ESOPHAGOGASTRODUODENOSCOPY (EGD) WITH PROPOFOL N/A 03/26/2019   Procedure:  ESOPHAGOGASTRODUODENOSCOPY (EGD) WITH PROPOFOL;  Surgeon: Lavena Bullion, DO;  Location: [place];  Service: Gastroenterology;  Laterality: N/A;  . FACIAL RECONSTRUCTION SURGERY     x 2  . HERNIA REPAIR    . PERIPHERAL VASCULAR INTERVENTION  04/27/2019   Procedure: PERIPHERAL VASCULAR INTERVENTION;  Surgeon: Marty Heck, MD;  Location: [place] [facility];  Service: Cardiovascular;;  SMA  . VISCERAL ANGIOGRAPHY N/A 04/27/2019   Procedure: MESENTERIC ANGIOGRAPHY;  Surgeon: Marty Heck, MD;  Location: [place] [facility];  Service: Cardiovascular;  Laterality: N/A;     OB History   No obstetric history on file.     Family History  Problem Relation Age of Onset  . Ulcers Sister        sick a long time, improved after surgery    Social History   Tobacco Use  . Smoking status: Former Smoker    Packs/day: 0.50    Years: 50.00    Pack years: 25.00    Quit date: 2019    Years since quitting: 2.3  . Smokeless tobacco: Never Used  Substance Use Topics  . Alcohol use: Not Currently    Comment: quit 2019 - "I was a drunk"  . Drug use: Not Currently    Types: Marijuana    Comment: h/o drug use - "I was raised in the 60s"; last use 18 months ago; smokes marijuana periodically    Home Medications Prior to Admission medications   Medication Sig Start Date End Date Taking? Authorizing Provider  albuterol (PROVENTIL) (2.5 MG/3ML) 0.083% nebulizer solution Take 2.5 mg by nebulization every 6 (six) hours as needed for wheezing or shortness of breath.    [provider]  allopurinol (ZYLOPRIM) 100 MG tablet Take 100 mg by mouth daily. 02/18/18   [provider]  amLODipine (NORVASC) 10 MG tablet Take 10 mg by mouth at bedtime.     [provider]  budesonide-formoterol (SYMBICORT) 160-4.5 MCG/ACT inhaler Inhale 2 puffs into the lungs every 4 (four) hours as needed (shortness of breath/wheezing).    [provider]  Cholecalciferol  (VITAMIN D3) 50 MCG (2000 UT) capsule Take 2,000 Units by mouth daily.     [provider]  clobetasol (TEMOVATE) 0.05 % external solution Apply 1 application topically daily as needed (psoriasis).  04/22/19   [provider]  dicyclomine (BENTYL) 10 MG capsule Take 20 mg by mouth at bedtime.  08/27/17   [provider]  diphenhydrAMINE (BENADRYL) 25 MG tablet Take 50 mg by mouth at bedtime.    [provider]  docusate sodium (COLACE) 100 MG capsule Take 1 capsule (100 mg total) by mouth 2 (two) times daily. Patient taking differently: Take 300 mg by mouth daily as needed (constipation).  08/11/18   Hongalgi, Lenis Dickinson, MD  ferric citrate (AURYXIA) 1 GM 210 MG(Fe) tablet Take 420 mg by mouth daily.     [provider]  gabapentin (NEURONTIN) 300 MG capsule Take 1 capsule (300 mg total) by mouth at  bedtime. 02/23/19   Mercy Riding, MD  Ipratropium-Albuterol (COMBIVENT) 20-100 MCG/ACT AERS respimat Inhale 2 puffs into the lungs 4 (four) times daily as needed for wheezing. 09/11/17   [provider]  isosorbide mononitrate (IMDUR) 30 MG 24 hr tablet Take 30 mg by mouth at bedtime.  08/17/18   [provider]  lidocaine-prilocaine (EMLA) cream Apply 1 application topically daily as needed (pain at the site of port access).  09/03/18   [provider]  metoprolol tartrate (LOPRESSOR) 100 MG tablet Take 1 tablet (100 mg total) by mouth 2 (two) times daily. 01/26/19   Little Ishikawa, MD  nitroGLYCERIN (NITROSTAT) 0.4 MG SL tablet Place 0.4 mg under the tongue every 5 (five) minutes as needed for chest pain.  09/18/16   [provider]  ondansetron (ZOFRAN) 4 MG tablet Take 4 mg by mouth 3 (three) times daily as needed for nausea. 09/14/18   [provider]  pantoprazole (PROTONIX) 40 MG tablet Take 1 tablet (40 mg total) by mouth 2 (two) times daily. 04/04/19   Shelly Coss, MD  polyethylene glycol (MIRALAX / GLYCOLAX) 17 g  packet Take 17 g by mouth daily as needed for mild constipation. Patient taking differently: Take 17 g by mouth 2 (two) times daily as needed for mild constipation.  03/30/19   Mercy Riding, MD  sennosides-docusate sodium (SENOKOT-S) 8.6-50 MG tablet Take 1 tablet by mouth as needed for constipation.     [provider]  sucralfate (CARAFATE) 1 GM/10ML suspension Take 10 mLs (1 g total) by mouth every 6 (six) hours. 03/30/19 05/29/19  Mercy Riding, MD    Allergies    Cyclobenzaprine, Morphine, Penicillins, Ambien [zolpidem], Codeine, and Hydromorphone  Review of Systems   Review of Systems  All other systems reviewed and are negative.   Physical Exam Updated Vital Signs Pulse (!) 107   Temp 98.7 F (37.1 C) (Oral)   Resp 20   Ht 5\' 2"  (1.575 m)   Wt 44.9 kg   SpO2 92%   BMI 18.11 kg/m   Physical Exam Vitals and nursing note reviewed.  Constitutional:      General: She is not in acute distress.    Appearance: She is well-developed. She is not diaphoretic.  HENT:     Head: Normocephalic and atraumatic.  Cardiovascular:     Rate and Rhythm: Normal rate and regular rhythm.     Heart sounds: No murmur. No friction rub. No gallop.   Pulmonary:     Effort: Pulmonary effort is normal. No respiratory distress.     Breath sounds: Normal breath sounds. No wheezing.  Abdominal:     General: Bowel sounds are normal. There is no distension.     Palpations: Abdomen is soft.     Tenderness: There is abdominal tenderness. There is guarding. There is no right CVA tenderness, left CVA tenderness or rebound.     Comments: There is exquisite abdominal tenderness in all 4 quadrants.  Musculoskeletal:        General: Normal range of motion.     Cervical back: Normal range of motion and neck supple.  Skin:    General: Skin is warm and dry.  Neurological:     Mental Status: She is alert and oriented to person, place, and time.     ED Results / Procedures / Treatments   Labs (all  labs ordered are listed, but only abnormal results are displayed) Labs Reviewed  COMPREHENSIVE METABOLIC PANEL  LIPASE, BLOOD  CBC WITH DIFFERENTIAL/PLATELET  PROTIME-INR  TYPE AND SCREEN    EKG None  Radiology No results found.  Procedures Procedures (including critical care time)  Medications Ordered in ED Medications - No data to display  ED Course  I have reviewed the triage vital signs and the nursing notes.  Pertinent labs & imaging results that were available during my care of the patient were reviewed by me and considered in my medical decision making (see chart for details).    MDM Rules/Calculators/A&P  Patient with extensive medical history including end-stage renal disease on hemodialysis, hypertension, asthma. She presents today for evaluation of bloody vomit and stool that began this morning when she woke up. She has had a 3 g hemoglobin drop from 10-7.1 since last admission. Patient written for transfusion which has been initiated.  Her abdominal exam reveals generalized tenderness. CT scan obtained shows findings consistent with duodenitis, however no other definitive acute process.  Shortly after returning from radiology, patient went into atrial fibrillation with rapid ventricular response. She will be started on a diltiazem drip.  Care discussed with Dr. Lorin Mercy from the hospitalist service. She will evaluate the patient and admit. During her last admission, she apparently had SMA stenting performed.  Lactate is pending.  I did have a conversation with the patient regarding code status.  She has informed me that she desires no CPR and no intubation, and is to be a DNR.  Final Clinical Impression(s) / ED Diagnoses Final diagnoses:  None    Rx / DC Orders ED Discharge Orders    None       Veryl Speak, MD 06/14/19 [id number]    Veryl Speak, MD 06/14/19 1702

## 2019-06-14 NOTE — H&P (Signed)
History and Physical    Christina [name] ZOX:[id number] DOB: [birth year] DOA: 06/14/2019  PCP: Maggie Schwalbe, PA-C Consultants:Cain - vascular; Otho Perl - cardiology; Kym.Balm - nephrology  Patient coming from: Home - lives alone; WJX:[id number], [place], [phone]  Chief Complaint: hematemesis  [name] a 69 y.o.femalewith medical history significant ofSBO; HTN; HLD; depression; CAD with stents; COPD; ESRD on TTS HD; and afib presenting with hematemesis.  I admitted her for abdominal pain on 1/25; this was thought to be related to missed HD and she was discharged on 1/27.  She returned for abdominal pain from 1/31-2/2, again after missing HD. She had a subsequent ER visit and then hospitalization and presented again from 2/26-3/3 with abdominal pain and ABLA in the setting of cocaine use.  EGD was performed and showed esophagitis, gastritis, and multiple duodenal ulcers.  She was started on PPI and Carafate.  She returned again on 3/8 with concern for hematemesis (hemoccult negative). PPI was increased to twice daily dosing.  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  She also had afib with RVR.  She was hospitalized again from 4/13-15 with acute respiratory failure from volume overload vs. COPD exacerbation.  She was hospitalized at [facility] from 4/25-27 with afib with RVR; no AC due to large duodenal ulcer, discharged on metoprolol and Cardizem.  Finally, she was hospitalized at [facility] from 5/8-9 with PAD and dry gangrene of a left foot digit; after discussion, the plan was for d/c with outpatient vascular follow up.  She reports hematemesis and large bloody BM at home this AM.  She is having significant abdominal pain as well as LLE pain associated with dry gangrene.  Upon arriving to the floor, she had a large bloody BM and was noted to have rectal  prolapse.  She reports that this happens frequently at home.   ED Course:   Woke up this AM with coffee ground emesis and then had a bloody BM.  Hgb 10 -> 7.1.  Abdominal pain out of proportion to exam - thickening of bowel wall.  Transfusing.  GI to follow.  Now in afib with RVR.  Review of Systems: As per HPI; otherwise review of systems reviewed and negative.     Past Medical History:  Diagnosis Date  . Anemia   . Anxiety   . Arthritis   . Asthma   . Atrial fibrillation ([place])   . Chronic kidney disease    Dialysis T/Th/Sa  started in March 2020  . COPD (chronic obstructive pulmonary disease) ([place])   . Coronary artery disease    2 stents  . Depression   . GERD (gastroesophageal reflux disease)   . Gout   . Headache    migraines  . History of kidney stones   . Hyperlipidemia   . Hypertension   . Pneumonia   . Small bowel obstruction [facility])     Past Surgical History:  Procedure Laterality Date  . ABDOMINAL HYSTERECTOMY    . ABDOMINAL SURGERY     for small bowel obstruction - x 2  . APPENDECTOMY    . AV FISTULA PLACEMENT Left 08/04/2018   Procedure: ARTERIOVENOUS (AV) FISTULA CREATION LEFT ARM;  Surgeon: Waynetta Sandy, MD;  Location: [place];  Service: Vascular;  Laterality: Left;  . BASCILIC VEIN TRANSPOSITION Left 11/24/2018   Procedure: SECOND STAGE BASILIC VEIN TRANSPOSITION LEFT ARM;  Surgeon: Waynetta Sandy, MD;  [facility];  Service: Vascular;  Laterality: Left;  . BIOPSY  03/26/2019   Procedure: BIOPSY;  Surgeon: Lavena Bullion, DO;  Location: [place];  Service: Gastroenterology;;  . CARDIAC CATHETERIZATION    . CORONARY ANGIOPLASTY  ?2003/2004  . ESOPHAGOGASTRODUODENOSCOPY (EGD) WITH PROPOFOL N/A 03/26/2019   Procedure: ESOPHAGOGASTRODUODENOSCOPY (EGD) WITH PROPOFOL;  Surgeon: Lavena Bullion, DO;  Location: [place];  Service: Gastroenterology;  Laterality: N/A;  . FACIAL RECONSTRUCTION SURGERY     x 2  . HERNIA REPAIR     . PERIPHERAL VASCULAR INTERVENTION  04/27/2019   Procedure: PERIPHERAL VASCULAR INTERVENTION;  Surgeon: Marty Heck, MD;  Location: [place] CV LAB;  Service: Cardiovascular;;  SMA  . VISCERAL ANGIOGRAPHY N/A 04/27/2019   Procedure: MESENTERIC ANGIOGRAPHY;  Surgeon: Marty Heck, MD;  Location: [place] CV LAB;  Service: Cardiovascular;  Laterality: N/A;    Social History   Socioeconomic History  . Marital status: Single    Spouse name: Not on file  . Number of children: Not on file  . Years of education: Not on file  . Highest education level: Not on file  Occupational History  . Occupation: retired  Tobacco Use  . Smoking status: Former Smoker    Packs/day: 0.50    Years: 50.00    Pack years: 25.00    Quit date: 2019    Years since quitting: 2.3  . Smokeless tobacco: Never Used  Substance and Sexual Activity  . Alcohol use: Not Currently    Comment: quit 2019 - "I was a drunk"  . Drug use: Not Currently    Types: Marijuana    Comment: h/o drug use - "I was raised in the 60s"; last use 18 months ago; smokes marijuana periodically  . Sexual activity: Not on file  Other Topics Concern  . Not on file  Social History Narrative  . Not on file   Social Determinants of Health   Financial Resource Strain:   . Difficulty of Paying Living Expenses:   Food Insecurity:   . Worried About Charity fundraiser in the Last Year:   . Arboriculturist in the Last Year:   Transportation Needs:   . Film/video editor (Medical):   Marland Kitchen Lack of Transportation (Non-Medical):   Physical Activity:   . Days of Exercise per Week:   . Minutes of Exercise per Session:   Stress:   . Feeling of Stress :   Social Connections:   . Frequency of Communication with Friends and Family:   . Frequency of Social Gatherings with Friends and Family:   . Attends Religious Services:   . Active Member of Clubs or Organizations:   . Attends Archivist Meetings:   Marland Kitchen Marital  Status:   Intimate Partner Violence:   . Fear of Current or Ex-Partner:   . Emotionally Abused:   Marland Kitchen Physically Abused:   . Sexually Abused:     Allergies  Allergen Reactions  . Betaine Anaphylaxis  . Cyclobenzaprine Anaphylaxis and Other (See Comments)    "stopped heart"   . Morphine Anaphylaxis    "stopped heart"  . Penicillins Shortness Of Breath, Swelling and Palpitations    Did it involve swelling of the face/tongue/throat, SOB, or low BP? Yes Did it involve sudden or severe rash/hives, skin peeling, or any reaction on the inside of your mouth or nose? No Did you need to seek medical attention at a hospital or doctor's office? No When  did it last happen?years  If all above answers are "NO", may proceed with cephalosporin use.   . Ambien [Zolpidem] Other (See Comments)    Severe confusion  . Codeine Itching and Rash  . Hydromorphone Other (See Comments)    If administered quickly, felt like hand was "exploding"     Family History  Problem Relation Age of Onset  . Ulcers Sister        sick a long time, improved after surgery    Prior to Admission medications   Medication Sig Start Date End Date Taking? Authorizing Provider  aspirin 81 MG EC tablet Take 81 mg by mouth daily. 05/12/19  Yes [provider]  furosemide (LASIX) 40 MG tablet Take 40 mg by mouth 3 (three) times a week. 05/26/19  Yes [provider]  albuterol (PROVENTIL) (2.5 MG/3ML) 0.083% nebulizer solution Take 2.5 mg by nebulization every 6 (six) hours as needed for wheezing or shortness of breath.    [provider]  allopurinol (ZYLOPRIM) 100 MG tablet Take 100 mg by mouth daily. 02/18/18   [provider]  amLODipine (NORVASC) 10 MG tablet Take 10 mg by mouth at bedtime.     [provider]  budesonide-formoterol (SYMBICORT) 160-4.5 MCG/ACT inhaler Inhale 2 puffs into the lungs every 4 (four) hours as needed (shortness of breath/wheezing).    [provider]  calcitRIOL (ROCALTROL) 0.5 MCG capsule Take 0.5 mcg by mouth 2 (two) times a week.    [provider]  Cholecalciferol (VITAMIN D3) 50 MCG (2000 UT) capsule Take 2,000 Units by mouth daily.     [provider]  clobetasol (TEMOVATE) 0.05 % external solution Apply 1 application topically daily as needed (psoriasis).  04/22/19   [provider]  clopidogrel (PLAVIX) 75 MG tablet Take 75 mg by mouth daily. 06/09/19   [provider]  dicyclomine (BENTYL) 10 MG capsule Take 20 mg by mouth at bedtime.  08/27/17   [provider]  diltiazem (CARDIZEM CD) 120 MG 24 hr capsule Take 120 mg by mouth at bedtime. 05/26/19   [provider]  diphenhydrAMINE (BENADRYL) 25 MG tablet Take 50 mg by mouth at bedtime.    [provider]  docusate sodium (COLACE) 100 MG capsule Take 1 capsule (100 mg total) by mouth 2 (two) times daily. Patient taking differently: Take 300 mg by mouth daily as needed (constipation).  08/11/18   Hongalgi, Lenis Dickinson, MD  ferric citrate (AURYXIA) 1 GM 210 MG(Fe) tablet Take 420 mg by mouth daily.     [provider]  gabapentin (NEURONTIN) 300 MG capsule Take 1 capsule (300 mg total) by mouth at bedtime. 02/23/19   Mercy Riding, MD  Ipratropium-Albuterol (COMBIVENT) 20-100 MCG/ACT AERS respimat Inhale 2 puffs into the lungs 4 (four) times daily as needed for wheezing. 09/11/17   [provider]  isosorbide mononitrate (IMDUR) 30 MG 24 hr tablet Take 30 mg by mouth at bedtime.  08/17/18   [provider]  lidocaine-prilocaine (EMLA) cream Apply 1 application topically daily as needed (pain at the site of port access).  09/03/18   [provider]  metoprolol tartrate (LOPRESSOR) 100 MG tablet Take 1 tablet (100 mg total) by mouth 2 (two) times daily. 01/26/19   Little Ishikawa, MD  nitroGLYCERIN (NITROSTAT) 0.4 MG SL tablet Place 0.4 mg under the tongue every 5 (five) minutes as needed  for chest pain.  09/18/16   [provider]  ondansetron [facility])  4 MG tablet Take 4 mg by mouth 3 (three) times daily as needed for nausea. 09/14/18   [provider]  pantoprazole (PROTONIX) 40 MG tablet Take 1 tablet (40 mg total) by mouth 2 (two) times daily. 04/04/19   Shelly Coss, MD  polyethylene glycol (MIRALAX / GLYCOLAX) 17 g packet Take 17 g by mouth daily as needed for mild constipation. Patient taking differently: Take 17 g by mouth 2 (two) times daily as needed for mild constipation.  03/30/19   Mercy Riding, MD  sennosides-docusate sodium (SENOKOT-S) 8.6-50 MG tablet Take 1 tablet by mouth as needed for constipation.     [provider]  sucralfate (CARAFATE) 1 GM/10ML suspension Take 10 mLs (1 g total) by mouth every 6 (six) hours. 03/30/19 05/29/19  Mercy Riding, MD    Physical Exam: Vitals:   06/14/19 1700 06/14/19 1715 06/14/19 1730 06/14/19 1745  BP: 125/77 (!) 142/66 (!) 148/72 (!) 111/53  Pulse:   95 97  Resp: 18 13 16 19   Temp:      TempSrc:      SpO2:   100% 97%  Weight:      Height:         . General:  Appears frail and chronically ill, pale . Eyes:  PERRL, EOMI, normal lids, iris . ENT:  grossly normal hearing, lips & tongue, mmm . Neck:  no LAD, masses or thyromegaly . Cardiovascular:  Irregularly irregular with marked tachycardia at initial visit, now in NSR, no m/r/g. No LE edema.  Marland Kitchen Respiratory:   CTA bilaterally with no wheezes/rales/rhonchi.  Normal respiratory effort. . Abdomen:  soft, diffusely TTP, ND, NABS . Back:   normal alignment, no CVAT . Skin:  Petechiae on B dorsal feet, L > R, with previously diagnosed dry gangrene of L 4th digit           . Musculoskeletal:  grossly normal tone BUE/BLE, good ROM, no bony abnormality . Psychiatric:  blunted mood and affect, speech fluent and appropriate, AOx3 . Neurologic:  CN 2-12 grossly intact, moves all extremities in coordinated fashion    Radiological Exams on  Admission: CT ABDOMEN PELVIS W CONTRAST  Result Date: 06/14/2019 CLINICAL DATA:  Vomiting and diarrhea beginning this morning. Abdominal pain and tenderness with palpation. EXAM: CT ABDOMEN AND PELVIS WITH CONTRAST TECHNIQUE: Multidetector CT imaging of the abdomen and pelvis was performed using the standard protocol following bolus administration of intravenous contrast. CONTRAST:  21mL OMNIPAQUE IOHEXOL 300 MG/ML  SOLN COMPARISON:  04/23/2019 FINDINGS: Lower Chest: New small bilateral pleural effusions and dependent bibasilar atelectasis. Stable cardiomegaly. Hepatobiliary: No hepatic masses identified. Gallbladder is unremarkable. No evidence of biliary ductal dilatation. Pancreas:  No mass or inflammatory changes. Spleen: Within normal limits in size and appearance. Adrenals/Urinary Tract: Stable bilateral adrenal masses, consistent with benign adenomas. Diffuse bilateral renal atrophy and tiny bilateral renal cysts remain stable. No evidence of ureteral calculi or hydronephrosis. Unremarkable unopacified urinary bladder. Stomach/Bowel: Diffuse duodenal wall thickening and mucosal enhancement has increased since previous study, consistent with duodenitis. A small postbulbar duodenal ulcer is seen and decreased in size since previous study. No evidence of free intraperitoneal air. Vascular/Lymphatic: No pathologically enlarged lymph nodes. No abdominal aortic aneurysm. Aortic atherosclerosis noted. Reproductive: Prior hysterectomy noted. Adnexal regions are unremarkable in appearance. Other: Several small midline epigastric ventral hernias are again seen which contain only fat. No evidence of herniated bowel loops. Increased diffuse body wall edema since prior exam. Musculoskeletal:  No suspicious bone  lesions identified. IMPRESSION: 1. Increased diffuse duodenal wall thickening and mucosal enhancement, consistent with duodenitis. 2. Decreased size of small postbulbar duodenal ulcer. 3. New small bilateral  pleural effusions diffuse body wall edema. 4. Stable small epigastric ventral hernias containing only fat. 5. Stable benign bilateral adrenal adenomas. Aortic Atherosclerosis (ICD10-I70.0). Electronically Signed   By: Marlaine Hind M.D.   On: 06/14/2019 15:21   DG Chest Port 1 View  Result Date: 06/14/2019 CLINICAL DATA:  Weakness. Dialysis patient. EXAM: PORTABLE CHEST 1 VIEW COMPARISON:  Chest x-ray 05/22/2019 FINDINGS: The right IJ dialysis catheter is in good position, unchanged. The cardiac silhouette, mediastinal and hilar contours are within normal limits. Mild tortuosity and calcification of the thoracic aorta. No acute pulmonary findings. No pleural effusions. No pulmonary lesions. IMPRESSION: No acute cardiopulmonary findings. Electronically Signed   By: Marijo Sanes M.D.   On: 06/14/2019 10:49    EKG: Independently reviewed.   1022 - Sinus tachycardia with rate 105; prolonged QTc 501; nonspecific ST changes with no evidence of acute ischemia 1511 - Sinus tachycardia with rate 116; nonspecific ST changes with no evidence of acute ischemia 1022 - Afib with rate 164   Labs on Admission: I have personally reviewed the available labs and imaging studies at the time of the admission.  Pertinent labs:   K+ 5.5 CO2 18 Glucose 147 BUN 80/Creatinine 6.71/GFR 6 Anion gap 18 Albumin 2.1 WBC 7.7 Hgb 7.1 INR 1.2   Assessment/Plan Principal Problem:   Acute GI bleeding Active Problems:   ESRD (end stage renal disease) (HCC)   CAD (coronary artery disease), native coronary artery   COPD with chronic bronchitis (HCC)   Essential hypertension   Hyperlipidemia   Cocaine abuse ([place])   Superior mesenteric artery atherosclerosis (HCC)   Atrial fibrillation with rapid ventricular response ([place])   Incomplete rectal prolapse    GI bleeding -Patient with multiple recurrent visits for abdominal pain and GI bleeding requiring transfusion -EGD on 2/27 was performed by Dr. Bryan Lemma and  showed grade B esophagitis without bleeding; 2 cm hiatal hernia; mild gastritis; and multiple duodenal ulcers in the duodenal bulb.  The largest ulcer was 15 mm and deeply cratered; it was too large for endoscopic intervention and was recommended for IR or surgical consultation if bleeding recurred.  These findings were concerning for ischemic etiology. Biopsies were reassuring. -She was positive for cocaine on 2/26, and cocaine use may be contributing to this issue. -On the next hospitalization she had high-grade SMA stenosis and had stent placement -Today, she had significant pain out of proportion to exam; lactate was ordered (1.8) and vascular was consulted but there is not current concern for recurrent ischemia per Dr. Nicole Cella note -Instead, this bleed appears to be related to her large duodenal ulcer -She is continuing to have large-volume maroon stools and may need recurrent transfusions -Will start IV BID PPI and continue Carafate -GI has consulted and will do EGD tomorrow -She requires inpatient admission to the hospital -Will monitor on progressive care unit for now  Rectal prolapse -Tennis ball-sized bowel protruding from rectum with diffuse maroon liquid upon evaluation on 6E -Patient d/w Dr. Rush Landmark, who recommended using Lidocaine jelly to push back in.   -If not pushing back in and having significant pain, needs surgery evaluation for tack procedure. -Will alert night coverage in case additional procedures are needed.  PVD -Dry gangrene of R 4th toe with ABIs concerning for ischemia -Vascular surgery consulted for other -This issue can likely be  followed as an outpatient -Hold ASA and Plavix for now  Afib with RVR -Patient presenting withrecurrent afib with RVR -RVR has improved while in the ER and she converted to NSR with rate 94 on 1722 EKG -She will be in Progressive Care and so if Diltiazem drip needs to be resumed this can happen -Clearly, given recent GI  bleeding and persistent large ulcer, she is a poor candidate for [facility]  ESRD on HD -Patient on chronic TTS HD -Nephrology prn order set utilized -Nephrology has been notified, should be ok to wait for HD tomorrow  HTN/CAD -Hold Norvasc, Imdur, Lopressor while on Cardizem drip; resume once no longer needing  COPD -Continue Combivent, Symbicort (Dulera substitute) - she uses both prn  HLD -Continue Pravachol  Cocaine abuse -UDS positive for cocaine on 2/26 -This may be contributing to recurrent GI bleeding -She clearly needs to stop using cocaine -Current UDS is pending   Note: This patient has been tested and is negative for the novel coronavirus COVID-19.   DVT prophylaxis:SCDs Code Status:DNR- confirmed with patient Family Communication:None present; I was unable to reach her daughter by telephone at the time of admission Disposition Plan:Home once clinically improved Consults called:Nephrology; GI; vascular surgery Admission status:Admit - It is my clinical opinion that admission to INPATIENT is reasonable and necessary because of the expectation that this patient will require hospital care that crosses at least 2 midnights to treat this condition based on the medical complexity of the problems presented.  Given the aforementioned information, the predictability of an adverse outcome is felt to be significant.   Karmen Bongo MD [facility]   How to contact the [facility] Attending or Consulting provider [place] or covering provider during after hours [place], for this patient?  1. Check the care team in [facility] and look for a) attending/consulting [facility] provider listed and b) the [facility] team listed 2. Log into [email or web address] and use [place]'s universal password to access. If you do not have the password, please contact the hospital operator. 3. Locate the [facility] provider you are looking for under [facility] and page to a number that you can be directly  reached. 4. If you still have difficulty reaching the provider, please page the [facility] (Director on Call) for the Hospitalists listed on amion for assistance.   06/14/2019, 7:49 PM

## 2019-06-14 NOTE — Progress Notes (Signed)
Hgb 6.0.  Patient had another maroon colored BM with rectal prolapse during BM.  Patient c/o severe abdominal pain, unable to lie still in bed and moaning in pain.  Dr. Myna Hidalgo notified of the above.  Orders received for PRN pain med and PRBCs.  Dr. Myna Hidalgo at bedside to address rectal prolapse.  Fentanyl 50 mcg IV given per MD order for abdominal pain.  Will continue to monitor.  Jodell Cipro

## 2019-06-15 ENCOUNTER — Encounter (HOSPITAL_COMMUNITY): Payer: Medicare Other

## 2019-06-15 ENCOUNTER — Ambulatory Visit: Payer: Medicare Other

## 2019-06-15 ENCOUNTER — Other Ambulatory Visit: Payer: Self-pay

## 2019-06-15 ENCOUNTER — Encounter (HOSPITAL_COMMUNITY): Payer: Self-pay | Admitting: Internal Medicine

## 2019-06-15 DIAGNOSIS — I2510 Atherosclerotic heart disease of native coronary artery without angina pectoris: Secondary | ICD-10-CM

## 2019-06-15 DIAGNOSIS — I4891 Unspecified atrial fibrillation: Secondary | ICD-10-CM

## 2019-06-15 DIAGNOSIS — K922 Gastrointestinal hemorrhage, unspecified: Secondary | ICD-10-CM

## 2019-06-15 HISTORY — DX: Gastrointestinal hemorrhage, unspecified: K92.2

## 2019-06-15 LAB — CBC
HCT: 25.4 % — ABNORMAL LOW (ref 36.0–46.0)
HCT: 25.5 % — ABNORMAL LOW (ref 36.0–46.0)
Hemoglobin: 8.5 g/dL — ABNORMAL LOW (ref 12.0–15.0)
Hemoglobin: 8.2 g/dL — ABNORMAL LOW (ref 12.0–15.0)
MCH: 29 pg (ref 26.0–34.0)
MCH: 28.5 pg (ref 26.0–34.0)
MCHC: 33.5 g/dL (ref 30.0–36.0)
MCHC: 32.2 g/dL (ref 30.0–36.0)
MCV: 86.7 fL (ref 80.0–100.0)
MCV: 88.5 fL (ref 80.0–100.0)
Platelets: 126 10*3/uL — ABNORMAL LOW (ref 150–400)
Platelets: 128 10*3/uL — ABNORMAL LOW (ref 150–400)
RBC: 2.93 MIL/uL — ABNORMAL LOW (ref 3.87–5.11)
RBC: 2.88 MIL/uL — ABNORMAL LOW (ref 3.87–5.11)
RDW: 16 % — ABNORMAL HIGH (ref 11.5–15.5)
RDW: 17.5 % — ABNORMAL HIGH (ref 11.5–15.5)
WBC: 11.3 10*3/uL — ABNORMAL HIGH (ref 4.0–10.5)
WBC: 12.7 10*3/uL — ABNORMAL HIGH (ref 4.0–10.5)
nRBC: 0 % (ref 0.0–0.2)
nRBC: 0 % (ref 0.0–0.2)

## 2019-06-15 LAB — BASIC METABOLIC PANEL
Anion gap: 12 (ref 5–15)
BUN: 103 mg/dL — ABNORMAL HIGH (ref 8–23)
CO2: 16 mmol/L — ABNORMAL LOW (ref 22–32)
Calcium: 7.3 mg/dL — ABNORMAL LOW (ref 8.9–10.3)
Chloride: 109 mmol/L (ref 98–111)
Creatinine, Ser: 7.01 mg/dL — ABNORMAL HIGH (ref 0.44–1.00)
GFR calc Af Amer: 6 mL/min — ABNORMAL LOW (ref >60)
GFR calc non Af Amer: 5 mL/min — ABNORMAL LOW (ref >60)
Glucose, Bld: 81 mg/dL (ref 70–99)
Potassium: 5.6 mmol/L — ABNORMAL HIGH (ref 3.5–5.1)
Sodium: 137 mmol/L (ref 135–145)

## 2019-06-15 LAB — LACTIC ACID, PLASMA: Lactic Acid, Venous: 0.8 mmol/L (ref 0.5–1.9)

## 2019-06-15 MED ORDER — AMIODARONE HCL IN DEXTROSE 360-4.14 MG/200ML-% IV SOLN
30.0000 mg/h | INTRAVENOUS | Status: DC
  Administered 2019-06-16 – 2019-06-17 (×3): 30 mg/h via INTRAVENOUS
  Filled 2019-06-15 (×2): qty 200

## 2019-06-15 MED ORDER — DICYCLOMINE HCL 10 MG PO CAPS
10.0000 mg | ORAL_CAPSULE | Freq: Three times a day (TID) | ORAL | Status: DC
  Administered 2019-06-15 – 2019-06-20 (×17): 10 mg via ORAL
  Filled 2019-06-15 (×20): qty 1

## 2019-06-15 MED ORDER — CHLORHEXIDINE GLUCONATE CLOTH 2 % EX PADS
6.0000 | MEDICATED_PAD | Freq: Every day | CUTANEOUS | Status: DC
  Administered 2019-06-15 – 2019-06-20 (×6): 6 via TOPICAL

## 2019-06-15 MED ORDER — AMIODARONE LOAD VIA INFUSION
150.0000 mg | Freq: Once | INTRAVENOUS | Status: AC
  Administered 2019-06-15: 150 mg via INTRAVENOUS
  Filled 2019-06-15: qty 83.34

## 2019-06-15 MED ORDER — SODIUM CHLORIDE 0.9 % IV SOLN: INTRAVENOUS | Status: DC

## 2019-06-15 MED ORDER — METOPROLOL TARTRATE 100 MG PO TABS
100.0000 mg | ORAL_TABLET | Freq: Two times a day (BID) | ORAL | Status: DC
  Administered 2019-06-15 – 2019-06-20 (×9): 100 mg via ORAL
  Filled 2019-06-15 (×9): qty 1

## 2019-06-15 MED ORDER — AMIODARONE HCL IN DEXTROSE 360-4.14 MG/200ML-% IV SOLN
60.0000 mg/h | INTRAVENOUS | Status: DC
  Administered 2019-06-15 (×2): 60 mg/h via INTRAVENOUS
  Filled 2019-06-15 (×2): qty 200

## 2019-06-15 NOTE — Plan of Care (Signed)
  Problem: Clinical Measurements: Goal: Ability to maintain clinical measurements within normal limits will improve Outcome: Progressing Goal: Will remain free from infection Outcome: Progressing Goal: Respiratory complications will improve Outcome: Progressing Goal: Cardiovascular complication will be avoided Outcome: Progressing   

## 2019-06-15 NOTE — Consult Note (Signed)
[facility] Renal Consultation Note    Indication for Consultation:  Management of ESRD/hemodialysis, anemia, hypertension/volume, and secondary hyperparathyroidism. PCP:  HPI: Christina [name] is a 69 y.o. female with ESRD, A-fib (not on AC), COPD, CAD (2 stents), HL, PAD, and recent stenting to SMA 04/27/19 who was admitted with GIB.  She reports that yesterday morning developed diffuse abdominal pain that had massive melena stool and few episodes of hematemesis. Apparently had been using significant amount of Aleve prior to presentation. In ED - labs showed Hgb 7.1 which later dropped to 6 after another melena episode which actually caused rectal prolapse. Developed A-fib RVR requiring diltiazem drip. CT abdomen with ?duodenitis. She was transfused 3U PRBCs. GI consulted - plan is for EGD this afternoon.  Today - she is feeling better. Abd pain improved. No CP/dyspnea. Has B LE pains due to PAD. Has been followed at outpatient, small wound to L 4th/5th toes.  Dialyzes on TTS schedule at [facility] - historically very non-compliant with her dialysis. Last HD session was 5/11. Last few HD as below:  5/11 - ran 1:21hr (of 4hr) - left at 44.3kg (3.8kg above EDW) 5/6 - ran 0:56hr - left at 44.9kg 4/29 - ran 3:30hr - left at 42.3kg  Past Medical History:  Diagnosis Date  . Anemia   . Anxiety   . Arthritis   . Asthma   . Atrial fibrillation ([place])   . Chronic kidney disease    Dialysis T/Th/Sa  started in March 2020  . COPD (chronic obstructive pulmonary disease) ([place])   . Coronary artery disease    2 stents  . Depression   . GERD (gastroesophageal reflux disease)   . GI bleeding 06/15/2019  . Gout   . Headache    migraines  . History of kidney stones   . Hyperlipidemia   . Hypertension   . Pneumonia   . Small bowel obstruction [facility])    Past Surgical History:  Procedure Laterality Date  . ABDOMINAL HYSTERECTOMY    . ABDOMINAL SURGERY     for small bowel  obstruction - x 2  . APPENDECTOMY    . AV FISTULA PLACEMENT Left 08/04/2018   Procedure: ARTERIOVENOUS (AV) FISTULA CREATION LEFT ARM;  Surgeon: Waynetta Sandy, MD;  Location: [place];  Service: Vascular;  Laterality: Left;  . BASCILIC VEIN TRANSPOSITION Left 11/24/2018   Procedure: SECOND STAGE BASILIC VEIN TRANSPOSITION LEFT ARM;  Surgeon: Waynetta Sandy, MD;  Location: [place];  Service: Vascular;  Laterality: Left;  . BIOPSY  03/26/2019   Procedure: BIOPSY;  Surgeon: Lavena Bullion, DO;  Location: [place];  Service: Gastroenterology;;  . CARDIAC CATHETERIZATION    . CORONARY ANGIOPLASTY  ?2003/2004  . ESOPHAGOGASTRODUODENOSCOPY (EGD) WITH PROPOFOL N/A 03/26/2019   Procedure: ESOPHAGOGASTRODUODENOSCOPY (EGD) WITH PROPOFOL;  Surgeon: Lavena Bullion, DO;  Location: [place];  Service: Gastroenterology;  Laterality: N/A;  . FACIAL RECONSTRUCTION SURGERY     x 2  . HERNIA REPAIR    . PERIPHERAL VASCULAR INTERVENTION  04/27/2019   Procedure: PERIPHERAL VASCULAR INTERVENTION;  Surgeon: Marty Heck, MD;  Location: [place] CV LAB;  Service: Cardiovascular;;  SMA  . VISCERAL ANGIOGRAPHY N/A 04/27/2019   Procedure: MESENTERIC ANGIOGRAPHY;  Surgeon: Marty Heck, MD;  Location: [place] [facility];  Service: Cardiovascular;  Laterality: N/A;   Family History  Problem Relation Age of Onset  . Ulcers Sister        sick a long time, improved  after surgery   Social History:  reports that she quit smoking about 2 years ago. She has a 25.00 pack-year smoking history. She has never used smokeless tobacco. She reports previous alcohol use. She reports previous drug use. Drug: Marijuana.  ROS: As per HPI otherwise negative.  Physical Exam: Vitals:   06/15/19 0421 06/15/19 0426 06/15/19 0809 06/15/19 1046  BP: 138/89 (!) 156/72 (!) 139/54 136/64  Pulse: 90 90 87 88  Resp: 14 13 (!) 21 18  Temp:  98.9 F (37.2 C) 98.3 F (36.8 C) 98.8 F (37.1 C)   TempSrc:  Axillary Oral Oral  SpO2: 100% 100% 100% 100%  Weight:  44 kg    Height:         General: Well developed, well nourished, in no acute distress. Head: Normocephalic, atraumatic, sclera non-icteric, mucus membranes are moist. Neck: Supple without lymphadenopathy/masses. JVD not elevated. Lungs: Wheezing in B upper lobes, clear at bases Heart: RRR; 4/6 systolic murmur noted Abdomen: Generalized tenderness Musculoskeletal:  Strength and tone appear normal for age. Lower extremities: No LE edema; ischemic changes to B 5th toes Neuro: Alert and oriented X 3. Moves all extremities spontaneously. Psych:  Responds to questions appropriately with a normal affect. Dialysis Access: LUE AVF + thrill and TDC in R chest  Allergies  Allergen Reactions  . Betaine Anaphylaxis  . Cyclobenzaprine Anaphylaxis and Other (See Comments)    "stopped heart"   . Morphine Anaphylaxis    "stopped heart"  . Penicillins Shortness Of Breath, Swelling and Palpitations    Did it involve swelling of the face/tongue/throat, SOB, or low BP? Yes Did it involve sudden or severe rash/hives, skin peeling, or any reaction on the inside of your mouth or nose? No Did you need to seek medical attention at a hospital or doctor's office? No When did it last happen?years  If all above answers are "NO", may proceed with cephalosporin use.   . Ambien [Zolpidem] Other (See Comments)    Severe confusion  . Codeine Itching and Rash  . Hydromorphone Other (See Comments)    If administered quickly, felt like hand was "exploding"    Prior to Admission medications   Medication Sig Start Date End Date Taking? Authorizing Provider  albuterol (PROVENTIL) (2.5 MG/3ML) 0.083% nebulizer solution Take 2.5 mg by nebulization every 6 (six) hours as needed for wheezing or shortness of breath.   Yes [provider]  allopurinol (ZYLOPRIM) 100 MG tablet Take 100 mg by mouth daily. 02/18/18  Yes [provider]  aspirin 81 MG EC tablet Take 81 mg by mouth daily. 05/12/19  Yes [provider]  budesonide-formoterol (SYMBICORT) 160-4.5 MCG/ACT inhaler Inhale 2 puffs into the lungs every 4 (four) hours as needed (shortness of breath/wheezing).   Yes [provider]  Cholecalciferol (VITAMIN D3) 50 MCG (2000 UT) capsule Take 2,000 Units by mouth daily.    Yes [provider]  clobetasol (TEMOVATE) 0.05 % external solution Apply 1 application topically daily as needed (psoriasis).  04/22/19  Yes [provider]  clopidogrel (PLAVIX) 75 MG tablet Take 75 mg by mouth daily. 06/09/19  Yes [provider]  dicyclomine (BENTYL) 10 MG capsule Take 10 mg by mouth at bedtime.  08/27/17  Yes [provider]  diphenhydrAMINE (BENADRYL) 25 MG tablet Take 25 mg by mouth at bedtime as needed for sleep.    Yes [provider]  docusate sodium (COLACE) 100 MG capsule Take 1 capsule (100 mg total) by mouth  2 (two) times daily. Patient taking differently: Take 300 mg by mouth daily as needed (constipation).  08/11/18  Yes Hongalgi, Lenis Dickinson, MD  ferric citrate (AURYXIA) 1 GM 210 MG(Fe) tablet Take 630 mg by mouth 3 (three) times daily with meals. 2 tablets with a snack = 420mg    Yes [provider]  furosemide (LASIX) 40 MG tablet Take 40 mg by mouth 3 (three) times a week. 05/26/19  Yes [provider]  gabapentin (NEURONTIN) 300 MG capsule Take 1 capsule (300 mg total) by mouth at bedtime. 02/23/19  Yes Mercy Riding, MD  Ipratropium-Albuterol (COMBIVENT) 20-100 MCG/ACT AERS respimat Inhale 2 puffs into the lungs 4 (four) times daily as needed for wheezing. 09/11/17  Yes [provider]  isosorbide mononitrate (IMDUR) 30 MG 24 hr tablet Take 30 mg by mouth at bedtime.  08/17/18  Yes [provider]  lidocaine-prilocaine (EMLA) cream Apply 1 application topically daily as needed (pain at the site of port access).  09/03/18  Yes [provider]  metoprolol tartrate (LOPRESSOR) 100 MG tablet Take 1 tablet (100 mg total) by mouth 2 (two) times daily. 01/26/19  Yes Little Ishikawa, MD  nitroGLYCERIN (NITROSTAT) 0.4 MG SL tablet Place 0.4 mg under the tongue every 5 (five) minutes as needed for chest pain.  09/18/16  Yes [provider]  ondansetron (ZOFRAN) 4 MG tablet Take 4 mg by mouth 3 (three) times daily as needed for nausea. 09/14/18  Yes [provider]  pantoprazole (PROTONIX) 40 MG tablet Take 1 tablet (40 mg total) by mouth 2 (two) times daily. 04/04/19  Yes Shelly Coss, MD  polyethylene glycol (MIRALAX / GLYCOLAX) 17 g packet Take 17 g by mouth daily as needed for mild constipation. Patient taking differently: Take 17 g by mouth 2 (two) times daily as needed for mild constipation.  03/30/19  Yes Mercy Riding, MD  pravastatin (PRAVACHOL) 80 MG tablet Take 80 mg by mouth every evening.   Yes [provider]  sennosides-docusate sodium (SENOKOT-S) 8.6-50 MG tablet Take 1 tablet by mouth as needed for constipation.    Yes [provider]  sucralfate (CARAFATE) 1 g tablet Take 1 g by mouth every 6 (six) hours.   Yes [provider]  amLODipine (NORVASC) 10 MG tablet Take 10 mg by mouth at bedtime.     [provider]  sucralfate (CARAFATE) 1 GM/10ML suspension Take 10 mLs (1 g total) by mouth every 6 (six) hours. 03/30/19 05/29/19  Mercy Riding, MD   Current Facility-Administered Medications  Medication Dose Route Frequency Provider Last Rate Last Admin  . 0.9 %  sodium chloride infusion  10 mL/hr Intravenous Once Karmen Bongo, MD      . 0.9 %  sodium chloride infusion   Intravenous Continuous Mansouraty, Telford Nab., MD      . 0.9 %  sodium chloride infusion   Intravenous Continuous [name], MD      . acetaminophen (TYLENOL) tablet 650 mg  650 mg Oral Q6H PRN Karmen Bongo, MD       Or  . acetaminophen (TYLENOL) suppository 650 mg  650 mg Rectal Q6H  PRN Karmen Bongo, MD      . albuterol (PROVENTIL) (2.5 MG/3ML) 0.083% nebulizer solution 2.5 mg  2.5 mg Nebulization Q6H PRN Karmen Bongo, MD      . allopurinol (ZYLOPRIM) tablet 100 mg  100 mg Oral Daily Karmen Bongo, MD   100 mg at 06/15/19 1016  .  calcium carbonate (dosed in mg elemental calcium) suspension 500 mg of elemental calcium  500 mg of elemental calcium Oral Q6H PRN Karmen Bongo, MD      . camphor-menthol [facility]) lotion 1 application  1 application Topical I6E PRN Karmen Bongo, MD       And  . hydrOXYzine (ATARAX/VISTARIL) tablet 25 mg  25 mg Oral Q8H PRN Karmen Bongo, MD      . Chlorhexidine Gluconate Cloth 2 % PADS 6 each  6 each Topical Daily Karmen Bongo, MD   6 each at 06/15/19 0530  . dicyclomine (BENTYL) capsule 10 mg  10 mg Oral Ivery Quale, MD   10 mg at 06/14/19 2355  . diphenhydrAMINE (BENADRYL) capsule 25 mg  25 mg Oral QHS PRN Karmen Bongo, MD      . docusate sodium [facility]) enema 283 mg  1 enema Rectal PRN Karmen Bongo, MD      . feeding supplement (NEPRO CARB STEADY) liquid 237 mL  237 mL Oral TID PRN Karmen Bongo, MD      . fentaNYL (SUBLIMAZE) injection 25-50 mcg  25-50 mcg Intravenous Q2H PRN Opyd, Ilene Qua, MD   50 mcg at 06/14/19 2356  . ferric citrate (AURYXIA) tablet 630 mg  630 mg Oral TID WC Karmen Bongo, MD      . gabapentin (NEURONTIN) capsule 300 mg  300 mg Oral Ivery Quale, MD   300 mg at 06/14/19 2354  . ipratropium-albuterol (DUONEB) 0.5-2.5 (3) MG/3ML nebulizer solution 3 mL  3 mL Inhalation QID PRN Karmen Bongo, MD      . isosorbide mononitrate (IMDUR) 24 hr tablet 30 mg  30 mg Oral Ivery Quale, MD   30 mg at 06/14/19 2354  . mometasone-formoterol (DULERA) 200-5 MCG/ACT inhaler 2 puff  2 puff Inhalation BID Karmen Bongo, MD   2 puff at 06/15/19 [phone]  . ondansetron (ZOFRAN) tablet 4 mg  4 mg Oral Q6H PRN Karmen Bongo, MD       Or  . ondansetron [facility]) injection 4 mg  4 mg Intravenous  Q6H PRN Karmen Bongo, MD      . pantoprazole (PROTONIX) 80 mg in sodium chloride 0.9 % 100 mL (0.8 mg/mL) infusion  8 mg/hr Intravenous Continuous Karmen Bongo, MD 10 mL/hr at 06/15/19 0734 8 mg/hr at 06/15/19 0734  . [START ON 06/18/2019] pantoprazole (PROTONIX) injection 40 mg  40 mg Intravenous Lillia Mountain, MD      . pantoprazole (PROTONIX) injection 40 mg  40 mg Intravenous Once Karmen Bongo, MD      . pravastatin (PRAVACHOL) tablet 80 mg  80 mg Oral QPM Karmen Bongo, MD   80 mg at 06/14/19 1704  . sorbitol 70 % solution 30 mL  30 mL Oral PRN Karmen Bongo, MD      . sucralfate (CARAFATE) tablet 1 g  1 g Oral Q6H Karmen Bongo, MD   1 g at 06/15/19 1017   Labs: Basic Metabolic Panel: Recent Labs  Lab 06/14/19 1010 06/15/19 0744  NA 137 137  K 5.5* 5.6*  CL 101 109  CO2 18* 16*  GLUCOSE 147* 81  BUN 80* 103*  CREATININE 6.71* 7.01*  CALCIUM 7.7* 7.3*   Liver Function Tests: Recent Labs  Lab 06/14/19 1010  AST 16  ALT 13  ALKPHOS 50  BILITOT 1.0  PROT 4.4*  ALBUMIN 2.1*   Recent Labs  Lab 06/14/19 1010  LIPASE 42   CBC: Recent Labs  Lab 06/14/19 1010  06/14/19 1852 06/15/19 0744  WBC 7.7  --  11.3*  NEUTROABS 6.5  --   --   HGB 7.1* 6.0* 8.5*  HCT 23.9* 19.5* 25.4*  MCV 91.2  --  86.7  PLT 202  --  126*   Studies/Results: CT ABDOMEN PELVIS W CONTRAST  Result Date: 06/14/2019 CLINICAL DATA:  Vomiting and diarrhea beginning this morning. Abdominal pain and tenderness with palpation. EXAM: CT ABDOMEN AND PELVIS WITH CONTRAST TECHNIQUE: Multidetector CT imaging of the abdomen and pelvis was performed using the standard protocol following bolus administration of intravenous contrast. CONTRAST:  80mL OMNIPAQUE IOHEXOL 300 MG/ML  SOLN COMPARISON:  04/23/2019 FINDINGS: Lower Chest: New small bilateral pleural effusions and dependent bibasilar atelectasis. Stable cardiomegaly. Hepatobiliary: No hepatic masses identified. Gallbladder is  unremarkable. No evidence of biliary ductal dilatation. Pancreas:  No mass or inflammatory changes. Spleen: Within normal limits in size and appearance. Adrenals/Urinary Tract: Stable bilateral adrenal masses, consistent with benign adenomas. Diffuse bilateral renal atrophy and tiny bilateral renal cysts remain stable. No evidence of ureteral calculi or hydronephrosis. Unremarkable unopacified urinary bladder. Stomach/Bowel: Diffuse duodenal wall thickening and mucosal enhancement has increased since previous study, consistent with duodenitis. A small postbulbar duodenal ulcer is seen and decreased in size since previous study. No evidence of free intraperitoneal air. Vascular/Lymphatic: No pathologically enlarged lymph nodes. No abdominal aortic aneurysm. Aortic atherosclerosis noted. Reproductive: Prior hysterectomy noted. Adnexal regions are unremarkable in appearance. Other: Several small midline epigastric ventral hernias are again seen which contain only fat. No evidence of herniated bowel loops. Increased diffuse body wall edema since prior exam. Musculoskeletal:  No suspicious bone lesions identified. IMPRESSION: 1. Increased diffuse duodenal wall thickening and mucosal enhancement, consistent with duodenitis. 2. Decreased size of small postbulbar duodenal ulcer. 3. New small bilateral pleural effusions diffuse body wall edema. 4. Stable small epigastric ventral hernias containing only fat. 5. Stable benign bilateral adrenal adenomas. Aortic Atherosclerosis (ICD10-I70.0). Electronically Signed   By: Marlaine Hind M.D.   On: 06/14/2019 15:21   DG Chest Port 1 View  Result Date: 06/14/2019 CLINICAL DATA:  Weakness. Dialysis patient. EXAM: PORTABLE CHEST 1 VIEW COMPARISON:  Chest x-ray 05/22/2019 FINDINGS: The right IJ dialysis catheter is in good position, unchanged. The cardiac silhouette, mediastinal and hilar contours are within normal limits. Mild tortuosity and calcification of the thoracic aorta. No  acute pulmonary findings. No pleural effusions. No pulmonary lesions. IMPRESSION: No acute cardiopulmonary findings. Electronically Signed   By: Marijo Sanes M.D.   On: 06/14/2019 10:49    Dialysis Orders:  TTS at [place], 350/A1.5, EDW 40.5kg, 2K/2Ca, UFP #2, AVF + TDC, no heparin - Mircera 297mcg IV q 2 weeks (last 5/6) - Venofer 100 x 10 ordered - Calcitriol 47mcg PO q HD  Assessment/Plan: 1.  Rapid UGIB: GI consulted - for EGD today. 3U PRBCs given so far - Hgb 8.5 today. 2. A-fib RVR: Improved today. 3.  Hyperkalemia: Will plan for HD after procedure. 4.  ESRD: Usual TTS schedule - has missed 1 week HD, will dialyze today. BUN ^ in setting of missed HD + GIB. Sounds like uses both AVF and TDC - will use AVF today. 5.  Hypertension/volume: BP stable - no edema on exam. 6.  Anemia (ABLA + ESRD): See #1. Will re-dose ESA today. 7.  Metabolic bone disease: CorrCa ok - resume binders once cleared to eat. 8.  Hx mesenteric ischemia: Hx SMA stent 3/31 9.  PAD: ?ischemic L 4th/5th toes.   Katie  Basilia Jumbo 06/15/2019, 11:54 AM  [employer]

## 2019-06-15 NOTE — H&P (View-Only) (Signed)
She resumed rapid Afib. She's also very uncomfortable with abdominal pain, cramping.  She had NO overt bleeding since last night.    I suspect her bleeding has been from PUD related to extreme NSAID overuse the past several weeks (10-12 ibuprofen daily).  This is likely also the cause of her abdominal pains.     EGD on hold for now given rapid afib.  Should control her abd pains with PRN narcotics.  Should continue her PPI drip. I'm ordering H/H for 6pm tonight, should transfuse to keep Hb > 7.  CBC in the morning. Will plan on EGD tomorrow, hopefully her HR will be under better control by then.

## 2019-06-15 NOTE — Progress Notes (Signed)
She resumed rapid Afib. She's also very uncomfortable with abdominal pain, cramping.  She had NO overt bleeding since last night.    I suspect her bleeding has been from PUD related to extreme NSAID overuse the past several weeks (10-12 ibuprofen daily).  This is likely also the cause of her abdominal pains.     EGD on hold for now given rapid afib.  Should control her abd pains with PRN narcotics.  Should continue her PPI drip. I'm ordering H/H for 6pm tonight, should transfuse to keep Hb > 7.  CBC in the morning. Will plan on EGD tomorrow, hopefully her HR will be under better control by then.

## 2019-06-15 NOTE — Progress Notes (Signed)
[facility] Progress Note    Since last GI note: Melenic stools yesterday and overnight some as well. No hematemesis.  She had rectal prolapse last night which was reduced by the hospitalist.  She tells me she has had problems with prolapse for many years, deal with it usually without any need for ER or urgent care. Has considered having it surgically repaired but never really was very interested in doing that because it doesn't bother her too often.  This morning I was able to figure out that she has actually be taking very high doses of NSAISs (10-12 ibuprofen daily and also some alleve) for at least 4 weeks because of pains in her feet.  Objective: Vital signs in last 24 hours: Temp:  [97.8 F (36.6 C)-98.9 F (37.2 C)] 98.3 F (36.8 C) (05/19 0809) Pulse Rate:  [53-128] 87 (05/19 0809) Resp:  [12-30] 21 (05/19 0809) BP: (107-191)/(51-109) 139/54 (05/19 0809) SpO2:  [92 %-100 %] 100 % (05/19 0809) Weight:  [44 kg-44.9 kg] 44 kg (05/19 0426) Last BM Date: 06/14/19 General: alert and oriented times 3 Heart: regular rate and rythm Abdomen: soft, non-tender, non-distended, normal bowel sounds  Lab Results: Recent Labs    06/14/19 1010 06/14/19 1852 06/15/19 0744  WBC 7.7  --  11.3*  HGB 7.1* 6.0* 8.5*  PLT 202  --  126*  MCV 91.2  --  86.7   Recent Labs    06/14/19 1010  NA 137  K 5.5*  CL 101  CO2 18*  GLUCOSE 147*  BUN 80*  CREATININE 6.71*  CALCIUM 7.7*   Recent Labs    06/14/19 1010  PROT 4.4*  ALBUMIN 2.1*  AST 16  ALT 13  ALKPHOS 50  BILITOT 1.0   Recent Labs    06/14/19 1010  INR 1.2    Medications: Scheduled Meds: . allopurinol  100 mg Oral Daily  . Chlorhexidine Gluconate Cloth  6 each Topical Daily  . dicyclomine  10 mg Oral QHS  . ferric citrate  630 mg Oral TID WC  . gabapentin  300 mg Oral QHS  . isosorbide mononitrate  30 mg Oral QHS  . mometasone-formoterol  2 puff Inhalation BID  . [START ON 06/18/2019] pantoprazole   40 mg Intravenous Q12H  . pantoprazole (PROTONIX) IV  40 mg Intravenous Once  . pravastatin  80 mg Oral QPM  . sucralfate  1 g Oral Q6H   Continuous Infusions: . sodium chloride    . sodium chloride    . pantoprozole (PROTONIX) infusion 8 mg/hr (06/15/19 0734)   PRN Meds:.acetaminophen **OR** acetaminophen, albuterol, calcium carbonate (dosed in mg elemental calcium), camphor-menthol **AND** hydrOXYzine, diphenhydrAMINE, docusate sodium, feeding supplement (NEPRO CARB STEADY), fentaNYL (SUBLIMAZE) injection, ipratropium-albuterol, ondansetron **OR** ondansetron (ZOFRAN) IV, sorbitol    Assessment/Plan: 69 y.o. female with UGI bleeding, likely related to extreme NSAID use in setting of duodenal ulcers and plavix.  She's received 3 units of PRBC so far (Hb 7.1 to 8.5). Currently hemodynamically stable.  I had hoped we could wait for full plavix washout however that is probably not possible given the pace of her bleeding.  I am planning EGD later today. She should stay on IV PPI drip for now.  Extreme NSAID use in the past several weeks at least.  She needs to never resume NSAIDs. She will need other options for her bilateral foot pain.  Rectal prolapse is a chronic intermittent problem for her and she is not interested in surgical repair.  [name], MD  06/15/2019, 8:34 AM [facility] Pager [phone]

## 2019-06-15 NOTE — Progress Notes (Addendum)
Patient converted to afib with RVR. Rates 120s-140s. Pt asymptomatic but c/o abdominal cramping. Notified Dr. Zigmund Daniel via text page. Notified endoscopy; per endo, EGD currently on hold until HR better controlled.

## 2019-06-15 NOTE — Progress Notes (Signed)
PROGRESS NOTE    Christina [name]  XBM:[id number] DOB: 02-11-1950 DOA: 06/14/2019 PCP: Maggie Schwalbe, PA-C    Brief Narrative: HPI per Dr. Lorin Mercy on 06/14/2019 68 y.o.femalewith medical history significant ofSBO; HTN; HLD; depression; CAD with stents; COPD; ESRD on TTS HD; and afib presenting with hematemesis. I admitted her for abdominal pain on 1/25; this was thought to be related to missed HD and she was discharged on 1/27. She returned for abdominal pain from 1/31-2/2, again after missing HD. She had a subsequent ER visit and then hospitalization and presented again from 2/26-3/3 with abdominal pain and ABLA in the setting of cocaine use. EGD was performed and showed esophagitis, gastritis, and multiple duodenal ulcers. She was started on PPI and Carafate. She returned again on 3/8 with concern for hematemesis (hemoccult negative). PPI was increased to twice daily dosing. 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  She also had afib with RVR.  She was hospitalized again from 4/13-15 with acute respiratory failure from volume overload vs. COPD exacerbation.  She was hospitalized at [facility] from 4/25-27 with afib with RVR; no AC due to large duodenal ulcer, discharged on metoprolol and Cardizem.  Finally, she was hospitalized at [facility] from 5/8-9 with PAD and dry gangrene of a left foot digit; after discussion, the plan was for d/c with outpatient vascular follow up.  She reports hematemesis and large bloody BM at home this AM.  She is having significant abdominal pain as well as LLE pain associated with dry gangrene.  Upon arriving to the floor, she had a large bloody BM and was noted to have rectal prolapse.  She reports that this happens frequently at home.   ED Course:   Woke up this AM with coffee ground emesis and then had a bloody BM.  Hgb 10 -> 7.1.  Abdominal pain  out of proportion to exam - thickening of bowel wall.  Transfusing.  GI to follow.  Now in afib with RVR.  06/15/2019 staff reported patient had a bloody bowel movement today. She also went into A. fib RVR and converted to sinus tach. She continued to complain of abdominal pain and cramps. Assessment & Plan:   Principal Problem:   Acute GI bleeding Active Problems:   ESRD (end stage renal disease) (HCC)   CAD (coronary artery disease), native coronary artery   COPD with chronic bronchitis (HCC)   Essential hypertension   Hyperlipidemia   Cocaine abuse ([place])   Superior mesenteric artery atherosclerosis (HCC)   Atrial fibrillation with rapid ventricular response ([place])   Incomplete rectal prolapse   #1 GI bleed patient has had multiple hospital admissions since the beginning of this year for the same and abdominal pain. Continue PPI IV EGD February 2021-grade B esophagitis without bleeding 2 cm hiatal hernia mild gastritis multiple duodenal ulcers in the duodenal bulb largest ulcer was 15 mm and deeply cratered. She is status post a high-grade SMA stenosis and stent placement. GI input appreciated.  Will check CBC stat now since she had another episode of bleeding this afternoon. Vascular surgery was consulted for concern for ischemic bowel-status post SMA stent CT angiogram consistent with patent stent.  He recommended to continue follow-up with Dr. Carlis Abbott for a mesenteric duplex.  #2 A. fib RVR not on anticoagulation due to GI bleeding history.  Restarted Lopressor she was taking prior to admission.  #3 ESRD on dialysis Tuesday  Thursday and Saturdays with a left upper arm fistula with a right IJ dialysis catheter in place  #4 peripheral vascular disease follow-up with vascular surgery as an outpatient.  On aspirin and Plavix which is on hold.  #5 chronic rectal prolapse patient does not want surgery done  #6 history of essential hypertension on Imdur, blood pressure 157/67.  #7  history of COPD stable  #8 history of hyperlipidemia continue statin  #9 history of cocaine abuse  #10 hyperkalemia patient on dialysis   Estimated body mass index is 17.74 kg/m as calculated from the following:   Height as of this encounter: 5\' 2"  (1.575 m).   Weight as of this encounter: 44 kg.  DVT prophylaxis: scd Code Status: dnr Family Communication: dw patient Disposition Plan:  Status is: Inpatient  Dispo: The patient is from:home              Anticipated d/c is to: unknown              Anticipated d/c date KV:[id number]              Patient currently is not medically stable to d/c.    Consultants: GI, vascular surgery, cardiology nephrology  Procedures: None Antimicrobials none  Subjective: Patient resting in bed she is awake and alert when I saw her early this morning she had not had any nausea vomiting or diarrhea later on during the day patient had bloody bowel reported by the staff.  Objective: Vitals:   06/15/19 0426 06/15/19 0809 06/15/19 1046 06/15/19 1500  BP: (!) 156/72 (!) 139/54 136/64 (!) 157/67  Pulse: 90 87 88 98  Resp: 13 (!) 21 18 (!) 21  Temp: 98.9 F (37.2 C) 98.3 F (36.8 C) 98.8 F (37.1 C)   TempSrc: Axillary Oral Oral   SpO2: 100% 100% 100% 94%  Weight: 44 kg     Height:        Intake/Output Summary (Last 24 hours) at 06/15/2019 1601 Last data filed at 06/15/2019 0424 Gross per 24 hour  Intake 1071.46 ml  Output --  Net 1071.46 ml   Filed Weights   06/14/19 1000 06/15/19 0426  Weight: 44.9 kg 44 kg    Examination:  General exam: Appears calm and comfortable  Respiratory system: Clear to auscultation. Respiratory effort normal. Cardiovascular system: S1 & S2 heard, RRR. No JVD, murmurs, rubs, gallops or clicks. No pedal edema. Gastrointestinal system: Abdomen is nondistended, soft and nontender. No organomegaly or masses felt. Normal bowel sounds heard. Central nervous system: Alert and oriented. No focal neurological  deficits. Extremities: Symmetric 5 x 5 power. Skin: No rashes, lesions or ulcers Psychiatry: Judgement and insight appear normal. Mood & affect appropriate.     Data Reviewed: I have personally reviewed following labs and imaging studies  CBC: Recent Labs  Lab 06/14/19 1010 06/14/19 1852 06/15/19 0744  WBC 7.7  --  11.3*  NEUTROABS 6.5  --   --   HGB 7.1* 6.0* 8.5*  HCT 23.9* 19.5* 25.4*  MCV 91.2  --  86.7  PLT 202  --  875*   Basic Metabolic Panel: Recent Labs  Lab 06/14/19 1010 06/15/19 0744  NA 137 137  K 5.5* 5.6*  CL 101 109  CO2 18* 16*  GLUCOSE 147* 81  BUN 80* 103*  CREATININE 6.71* 7.01*  CALCIUM 7.7* 7.3*   GFR: Estimated Creatinine Clearance: 5.3 mL/min (A) (by C-G formula based on SCr of 7.01 mg/dL (H)). Liver Function Tests: Recent Labs  Lab 06/14/19 1010  AST 16  ALT 13  ALKPHOS 50  BILITOT 1.0  PROT 4.4*  ALBUMIN 2.1*   Recent Labs  Lab 06/14/19 1010  LIPASE 42   No results for input(s): AMMONIA in the last 168 hours. Coagulation Profile: Recent Labs  Lab 06/14/19 1010  INR 1.2   Cardiac Enzymes: No results for input(s): CKTOTAL, CKMB, CKMBINDEX, TROPONINI in the last 168 hours. BNP (last 3 results) No results for input(s): PROBNP in the last 8760 hours. HbA1C: No results for input(s): HGBA1C in the last 72 hours. CBG: No results for input(s): GLUCAP in the last 168 hours. Lipid Profile: No results for input(s): CHOL, HDL, LDLCALC, TRIG, CHOLHDL, LDLDIRECT in the last 72 hours. Thyroid Function Tests: No results for input(s): TSH, T4TOTAL, FREET4, T3FREE, THYROIDAB in the last 72 hours. Anemia Panel: No results for input(s): VITAMINB12, FOLATE, FERRITIN, TIBC, IRON, RETICCTPCT in the last 72 hours. Sepsis Labs: Recent Labs  Lab 06/14/19 1610 06/14/19 1852 06/15/19 0744  LATICACIDVEN 1.8 3.8* 0.8    Recent Results (from the past 240 hour(s))  SARS Coronavirus 2 by RT PCR (hospital order, performed in [facility]  hospital lab) Nasopharyngeal Nasopharyngeal Swab     Status: None   Collection Time: 06/14/19  3:36 PM   Specimen: Nasopharyngeal Swab  Result Value Ref Range Status   SARS Coronavirus 2 NEGATIVE NEGATIVE Final    Comment: (NOTE) SARS-CoV-2 target nucleic acids are NOT DETECTED. The SARS-CoV-2 RNA is generally detectable in upper and lower respiratory specimens during the acute phase of infection. The lowest concentration of SARS-CoV-2 viral copies this assay can detect is 250 copies / mL. A negative result does not preclude SARS-CoV-2 infection and should not be used as the sole basis for treatment or other patient management decisions.  A negative result may occur with improper specimen collection / handling, submission of specimen other than nasopharyngeal swab, presence of viral mutation(s) within the areas targeted by this assay, and inadequate number of viral copies (<250 copies / mL). A negative result must be combined with clinical observations, patient history, and epidemiological information. Fact Sheet for Patients:   StrictlyIdeas.no Fact Sheet for Healthcare Providers: BankingDealers.co.za This test is not yet approved or cleared  by the [place] FDA and has been authorized for detection and/or diagnosis of SARS-CoV-2 by FDA under an Emergency Use Authorization (EUA).  This EUA will remain in effect (meaning this test can be used) for the duration of the COVID-19 declaration under Section 564(b)(1) of the Act, 21 U.S.C. section 360bbb-3(b)(1), unless the authorization is terminated or revoked sooner. Performed at [facility], [place] [address], [place], [place] [address]          Radiology Studies: CT ABDOMEN PELVIS W CONTRAST  Result Date: 06/14/2019 CLINICAL DATA:  Vomiting and diarrhea beginning this morning. Abdominal pain and tenderness with palpation. EXAM: CT ABDOMEN AND PELVIS WITH CONTRAST TECHNIQUE:  Multidetector CT imaging of the abdomen and pelvis was performed using the standard protocol following bolus administration of intravenous contrast. CONTRAST:  41mL OMNIPAQUE IOHEXOL 300 MG/ML  SOLN COMPARISON:  04/23/2019 FINDINGS: Lower Chest: New small bilateral pleural effusions and dependent bibasilar atelectasis. Stable cardiomegaly. Hepatobiliary: No hepatic masses identified. Gallbladder is unremarkable. No evidence of biliary ductal dilatation. Pancreas:  No mass or inflammatory changes. Spleen: Within normal limits in size and appearance. Adrenals/Urinary Tract: Stable bilateral adrenal masses, consistent with benign adenomas. Diffuse bilateral renal atrophy and tiny bilateral renal cysts remain stable. No evidence  of ureteral calculi or hydronephrosis. Unremarkable unopacified urinary bladder. Stomach/Bowel: Diffuse duodenal wall thickening and mucosal enhancement has increased since previous study, consistent with duodenitis. A small postbulbar duodenal ulcer is seen and decreased in size since previous study. No evidence of free intraperitoneal air. Vascular/Lymphatic: No pathologically enlarged lymph nodes. No abdominal aortic aneurysm. Aortic atherosclerosis noted. Reproductive: Prior hysterectomy noted. Adnexal regions are unremarkable in appearance. Other: Several small midline epigastric ventral hernias are again seen which contain only fat. No evidence of herniated bowel loops. Increased diffuse body wall edema since prior exam. Musculoskeletal:  No suspicious bone lesions identified. IMPRESSION: 1. Increased diffuse duodenal wall thickening and mucosal enhancement, consistent with duodenitis. 2. Decreased size of small postbulbar duodenal ulcer. 3. New small bilateral pleural effusions diffuse body wall edema. 4. Stable small epigastric ventral hernias containing only fat. 5. Stable benign bilateral adrenal adenomas. Aortic Atherosclerosis (ICD10-I70.0). Electronically Signed   By: Marlaine Hind  M.D.   On: 06/14/2019 15:21   DG Chest Port 1 View  Result Date: 06/14/2019 CLINICAL DATA:  Weakness. Dialysis patient. EXAM: PORTABLE CHEST 1 VIEW COMPARISON:  Chest x-ray 05/22/2019 FINDINGS: The right IJ dialysis catheter is in good position, unchanged. The cardiac silhouette, mediastinal and hilar contours are within normal limits. Mild tortuosity and calcification of the thoracic aorta. No acute pulmonary findings. No pleural effusions. No pulmonary lesions. IMPRESSION: No acute cardiopulmonary findings. Electronically Signed   By: Marijo Sanes M.D.   On: 06/14/2019 10:49        Scheduled Meds: . allopurinol  100 mg Oral Daily  . Chlorhexidine Gluconate Cloth  6 each Topical Daily  . dicyclomine  10 mg Oral TID AC & HS  . ferric citrate  630 mg Oral TID WC  . gabapentin  300 mg Oral QHS  . isosorbide mononitrate  30 mg Oral QHS  . mometasone-formoterol  2 puff Inhalation BID  . [START ON 06/18/2019] pantoprazole  40 mg Intravenous Q12H  . pantoprazole (PROTONIX) IV  40 mg Intravenous Once  . pravastatin  80 mg Oral QPM  . sucralfate  1 g Oral Q6H   Continuous Infusions: . sodium chloride    . sodium chloride    . sodium chloride    . pantoprozole (PROTONIX) infusion 8 mg/hr (06/15/19 0734)     LOS: 1 day     Georgette Shell, MD 06/15/2019, 4:01 PM

## 2019-06-15 NOTE — Consult Note (Addendum)
The patient has been seen in conjunction with Cadence Kathlen Mody, PAC. All aspects of care have been considered and discussed. The patient has been personally interviewed, examined, and all clinical data has been reviewed.   The best current option to decrease recurrent atrial fibrillation with rapid ventricular response and to decrease the burden of atrial fibrillation in this patient who cannot be safely anticoagulated is amiodarone.  We will started intravenously and eventually switch to oral dosing.  She is currently in sinus rhythm and asymptomatic.     Cardiology Consultation:   Patient ID: Christina [name] MRN: [id number]; DOB: [birth year]  Admit date: 06/14/2019 Date of Consult: 06/15/2019  Primary Care Provider: Maggie Schwalbe, PA-C Primary Cardiologist: Dr. Otho Perl at [facility] Primary Electrophysiologist:  None   Patient Profile:   Christina [name] is a 69 y.o. 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 Rodena Piety.  History of Present Illness:   Ms. Buesing has not been seen by [facility] in the past. She has been seen by Dr. Otho Perl at [facility]. She was last seen 08/2018 for regular follow-up. Denied any chest discomfort. She had a ZIO patch that had no recurrence of afib. No changes were made and 6 month follow up was scheduled. Her last echo was 12/2018 which showed LVEF 45-50%, mildly thickened septal wall, G1DD, global hypokinesis, LA moderately dilated, mild mitral annular calcification, mild to mod MR, mild AI, mildly elevated pulmonary artery systolic pressure.   She has multiple admissions or ER visits for abdominal pain related to missing HD.  Hospitalization February with abd pain, ABLA in the setting of cocaine use. She was started on PPI and carafate for esophagitis, gastritis and duodenal ulcers. 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 Also had Afib RVR. Hospitalized again 4/13-4/15 for volume overload vs COPD exacerbation. Hospitalized again at [facility] 4/25-27 for afib RVR not on a/c for h/o of duodenal ulcers discharged on metoprolol and cardizem. Again was hospitalized in early May at [facility] for PAD and gangrene.   The patient presented to the ED 06/14/19 for hematemesis and large bloody BM. Also with significant abdominal pain. Denied chest pain, palpitations, lightheadedness, dizziness. In the ED B/P 173/109, pulse 115, afebrile, RR 19. Hgb 7.1 and then 6.0 and she was transfused 3 units. LA 3.8. CXR unremarkable. CTA chest/abd/pelvis with duodenitis, new bilateral pleural effusions. When she first came in she was in sinus tachycardia and then converted to afib RVR with rates to 160s and started on IV cardizem. Looks like she spontaneously converted to sinus and IV cardizem was stopped last night.  GI consulted and planned for EGD but this was held for Afib rates and cardiology consulted.   Patient denies symptoms while in afib. She is currently in sinus.   Past Medical History:  Diagnosis Date  . Anemia   . Anxiety   . Arthritis   . Asthma   . Atrial fibrillation ([place])   . Chronic kidney disease    Dialysis T/Th/Sa  started in March 2020  . COPD (chronic obstructive pulmonary  disease) ([place])   . Coronary artery disease    2 stents  . Depression   . GERD (gastroesophageal reflux disease)   . GI bleeding 06/15/2019  . Gout   . Headache    migraines  . History of kidney stones   . Hyperlipidemia   . Hypertension   . Pneumonia   . Small bowel obstruction [facility])     Past Surgical History:  Procedure Laterality Date  . ABDOMINAL  HYSTERECTOMY    . ABDOMINAL SURGERY     for small bowel obstruction - x 2  . APPENDECTOMY    . AV FISTULA PLACEMENT Left 08/04/2018   Procedure: ARTERIOVENOUS (AV) FISTULA CREATION LEFT ARM;  Surgeon: Waynetta Sandy, MD;  Location: [place];  Service: Vascular;  Laterality: Left;  . BASCILIC VEIN TRANSPOSITION Left 11/24/2018   Procedure: SECOND STAGE BASILIC VEIN TRANSPOSITION LEFT ARM;  Surgeon: Waynetta Sandy, MD;  Location: [place];  Service: Vascular;  Laterality: Left;  . BIOPSY  03/26/2019   Procedure: BIOPSY;  Surgeon: Lavena Bullion, DO;  Location: [place];  Service: Gastroenterology;;  . CARDIAC CATHETERIZATION    . CORONARY ANGIOPLASTY  ?2003/2004  . ESOPHAGOGASTRODUODENOSCOPY (EGD) WITH PROPOFOL N/A 03/26/2019   Procedure: ESOPHAGOGASTRODUODENOSCOPY (EGD) WITH PROPOFOL;  Surgeon: Lavena Bullion, DO;  Location: [place];  Service: Gastroenterology;  Laterality: N/A;  . FACIAL RECONSTRUCTION SURGERY     x 2  . HERNIA REPAIR    . PERIPHERAL VASCULAR INTERVENTION  04/27/2019   Procedure: PERIPHERAL VASCULAR INTERVENTION;  Surgeon: Marty Heck, MD;  Location: [place] CV LAB;  Service: Cardiovascular;;  SMA  . VISCERAL ANGIOGRAPHY N/A 04/27/2019   Procedure: MESENTERIC ANGIOGRAPHY;  Surgeon: Marty Heck, MD;  Location: [place] CV LAB;  Service: Cardiovascular;  Laterality: N/A;     Home Medications:  Prior to Admission medications   Medication Sig Start Date End Date Taking? Authorizing Provider  albuterol (PROVENTIL) (2.5 MG/3ML) 0.083% nebulizer solution Take 2.5 mg by nebulization every 6 (six) hours as needed for wheezing or shortness of breath.   Yes [provider]  allopurinol (ZYLOPRIM) 100 MG tablet Take 100 mg by mouth daily. 02/18/18  Yes [provider]  aspirin 81 MG EC tablet Take 81 mg by mouth daily. 05/12/19  Yes [provider]  budesonide-formoterol (SYMBICORT) 160-4.5 MCG/ACT inhaler  Inhale 2 puffs into the lungs every 4 (four) hours as needed (shortness of breath/wheezing).   Yes [provider]  Cholecalciferol (VITAMIN D3) 50 MCG (2000 UT) capsule Take 2,000 Units by mouth daily.    Yes [provider]  clobetasol (TEMOVATE) 0.05 % external solution Apply 1 application topically daily as needed (psoriasis).  04/22/19  Yes [provider]  clopidogrel (PLAVIX) 75 MG tablet Take 75 mg by mouth daily. 06/09/19  Yes [provider]  dicyclomine (BENTYL) 10 MG capsule Take 10 mg by mouth at bedtime.  08/27/17  Yes [provider]  diphenhydrAMINE (BENADRYL) 25 MG tablet Take 25 mg by mouth at bedtime as needed for sleep.    Yes [provider]  docusate sodium (COLACE) 100 MG capsule Take 1 capsule (100 mg total) by mouth 2 (two) times daily. Patient taking differently: Take 300 mg by mouth daily as needed (constipation).  08/11/18  Yes Hongalgi, Lenis Dickinson, MD  ferric citrate (AURYXIA) 1 GM 210 MG(Fe) tablet Take 630 mg by mouth 3 (three) times daily with meals. 2 tablets with a snack = 420mg   Yes [provider]  furosemide (LASIX) 40 MG tablet Take 40 mg by mouth 3 (three) times a week. 05/26/19  Yes [provider]  gabapentin (NEURONTIN) 300 MG capsule Take 1 capsule (300 mg total) by mouth at bedtime. 02/23/19  Yes Mercy Riding, MD  Ipratropium-Albuterol (COMBIVENT) 20-100 MCG/ACT AERS respimat Inhale 2 puffs into the lungs 4 (four) times daily as needed for wheezing. 09/11/17  Yes [provider]  isosorbide mononitrate (IMDUR) 30 MG 24 hr tablet Take 30 mg by mouth at bedtime.  08/17/18  Yes [provider]  lidocaine-prilocaine (EMLA) cream Apply 1 application topically daily as needed (pain at the site of port access).  09/03/18  Yes [provider]  metoprolol tartrate (LOPRESSOR) 100 MG tablet Take 1 tablet (100 mg total) by mouth 2 (two) times daily. 01/26/19  Yes Little Ishikawa,  MD  nitroGLYCERIN (NITROSTAT) 0.4 MG SL tablet Place 0.4 mg under the tongue every 5 (five) minutes as needed for chest pain.  09/18/16  Yes [provider]  ondansetron (ZOFRAN) 4 MG tablet Take 4 mg by mouth 3 (three) times daily as needed for nausea. 09/14/18  Yes [provider]  pantoprazole (PROTONIX) 40 MG tablet Take 1 tablet (40 mg total) by mouth 2 (two) times daily. 04/04/19  Yes Shelly Coss, MD  polyethylene glycol (MIRALAX / GLYCOLAX) 17 g packet Take 17 g by mouth daily as needed for mild constipation. Patient taking differently: Take 17 g by mouth 2 (two) times daily as needed for mild constipation.  03/30/19  Yes Mercy Riding, MD  pravastatin (PRAVACHOL) 80 MG tablet Take 80 mg by mouth every evening.   Yes [provider]  sennosides-docusate sodium (SENOKOT-S) 8.6-50 MG tablet Take 1 tablet by mouth as needed for constipation.    Yes [provider]  sucralfate (CARAFATE) 1 g tablet Take 1 g by mouth every 6 (six) hours.   Yes [provider]  amLODipine (NORVASC) 10 MG tablet Take 10 mg by mouth at bedtime.     [provider]  sucralfate (CARAFATE) 1 GM/10ML suspension Take 10 mLs (1 g total) by mouth every 6 (six) hours. 03/30/19 05/29/19  Mercy Riding, MD    Inpatient Medications: Scheduled Meds: . allopurinol  100 mg Oral Daily  . Chlorhexidine Gluconate Cloth  6 each Topical Daily  . dicyclomine  10 mg Oral TID AC & HS  . ferric citrate  630 mg Oral TID WC  . gabapentin  300 mg Oral QHS  . isosorbide mononitrate  30 mg Oral QHS  . mometasone-formoterol  2 puff Inhalation BID  . [START ON 06/18/2019] pantoprazole  40 mg Intravenous Q12H  . pantoprazole (PROTONIX) IV  40 mg Intravenous Once  . pravastatin  80 mg Oral QPM  . sucralfate  1 g Oral Q6H   Continuous Infusions: . sodium chloride    . sodium chloride    . sodium chloride    . pantoprozole (PROTONIX) infusion 8 mg/hr (06/15/19 0734)   PRN  Meds: acetaminophen **OR** acetaminophen, albuterol, calcium carbonate (dosed in mg elemental calcium), camphor-menthol **AND** hydrOXYzine, diphenhydrAMINE, docusate sodium, feeding supplement (NEPRO CARB STEADY), fentaNYL (SUBLIMAZE) injection, ipratropium-albuterol, ondansetron **OR** ondansetron (ZOFRAN) IV, sorbitol  Allergies:    Allergies  Allergen Reactions  . Betaine Anaphylaxis  . Cyclobenzaprine Anaphylaxis and Other (See Comments)    "stopped heart"   . Morphine Anaphylaxis    "stopped heart"  . Penicillins Shortness Of Breath, Swelling and  Palpitations    Did it involve swelling of the face/tongue/throat, SOB, or low BP? Yes Did it involve sudden or severe rash/hives, skin peeling, or any reaction on the inside of your mouth or nose? No Did you need to seek medical attention at a hospital or doctor's office? No When did it last happen?years  If all above answers are "NO", may proceed with cephalosporin use.   . Ambien [Zolpidem] Other (See Comments)    Severe confusion  . Codeine Itching and Rash  . Hydromorphone Other (See Comments)    If administered quickly, felt like hand was "exploding"     Social History:   Social History   Socioeconomic History  . Marital status: Single    Spouse name: Not on file  . Number of children: Not on file  . Years of education: Not on file  . Highest education level: Not on file  Occupational History  . Occupation: retired  Tobacco Use  . Smoking status: Former Smoker    Packs/day: 0.50    Years: 50.00    Pack years: 25.00    Quit date: 2019    Years since quitting: 2.3  . Smokeless tobacco: Never Used  Substance and Sexual Activity  . Alcohol use: Not Currently    Comment: quit 2019 - "I was a drunk"  . Drug use: Not Currently    Types: Marijuana    Comment: h/o drug use - "I was raised in the 60s"; last use 18 months ago; smokes marijuana periodically  . Sexual activity: Not on file  Other Topics Concern  .  Not on file  Social History Narrative  . Not on file   Social Determinants of Health   Financial Resource Strain:   . Difficulty of Paying Living Expenses:   Food Insecurity:   . Worried About Charity fundraiser in the Last Year:   . Arboriculturist in the Last Year:   Transportation Needs:   . Film/video editor (Medical):   Marland Kitchen Lack of Transportation (Non-Medical):   Physical Activity:   . Days of Exercise per Week:   . Minutes of Exercise per Session:   Stress:   . Feeling of Stress :   Social Connections:   . Frequency of Communication with Friends and Family:   . Frequency of Social Gatherings with Friends and Family:   . Attends Religious Services:   . Active Member of Clubs or Organizations:   . Attends Archivist Meetings:   Marland Kitchen Marital Status:   Intimate Partner Violence:   . Fear of Current or Ex-Partner:   . Emotionally Abused:   Marland Kitchen Physically Abused:   . Sexually Abused:     Family History:   Family History  Problem Relation Age of Onset  . Ulcers Sister        sick a long time, improved after surgery     ROS:  Please see the history of present illness.  All other ROS reviewed and negative.     Physical Exam/Data:   Vitals:   06/15/19 0421 06/15/19 0426 06/15/19 0809 06/15/19 1046  BP: 138/89 (!) 156/72 (!) 139/54 136/64  Pulse: 90 90 87 88  Resp: 14 13 (!) 21 18  Temp:  98.9 F (37.2 C) 98.3 F (36.8 C) 98.8 F (37.1 C)  TempSrc:  Axillary Oral Oral  SpO2: 100% 100% 100% 100%  Weight:  44 kg    Height:  Intake/Output Summary (Last 24 hours) at 06/15/2019 1533 Last data filed at 06/15/2019 0424 Gross per 24 hour  Intake 1336.46 ml  Output --  Net 1336.46 ml   Last 3 Weights 06/15/2019 06/14/2019 05/12/2019  Weight (lbs) 97 lb 99 lb 92 lb 6 oz  Weight (kg) 44 kg 44.906 kg 41.9 kg     Body mass index is 17.74 kg/m.  General:  Well nourished, well developed, in no acute distress HEENT: normal Lymph: no adenopathy Neck: no  JVD Endocrine:  No thryomegaly Vascular: No carotid bruits; FA pulses 2+ bilaterally without bruits  Cardiac:  normal S1, S2; RRR; no murmur  Lungs:  clear to auscultation bilaterally, no wheezing, rhonchi or rales  Abd: soft, nontender, no hepatomegaly  Ext: no edema Musculoskeletal:  No deformities, BUE and BLE strength normal and equal Skin: warm and dry  Neuro:  CNs 2-12 intact, no focal abnormalities noted Psych:  Normal affect   EKG:  The EKG was personally reviewed and demonstrates:  Sinus, 94 bpm, TWI anterior leads Telemetry:  Telemetry was personally reviewed and demonstrates:  Previously in afib with rates up to 140s, now in sinus with rates in the 80-90s  Relevant CV Studies:  Echo 12/2018 1. Left ventricular ejection fraction, by visual estimation, is 45 to  50%. The left ventricle has mildly decreased function. Left ventricular  septal wall thickness was mildly increased. There is no left ventricular  hypertrophy.  2. Left ventricular diastolic parameters are consistent with Grade I  diastolic dysfunction (impaired relaxation).  3. The left ventricle demonstrates global hypokinesis.  4. Elevated LVEDP.  5. Global right ventricle has normal systolic function.The right  ventricular size is normal. No increase in right ventricular wall  thickness.  6. Left atrial size was moderately dilated.  7. Right atrial size was normal.  8. Mild mitral annular calcification.  9. The mitral valve is normal in structure. Mild to moderate mitral valve  regurgitation. No evidence of mitral stenosis.  10. The tricuspid valve is normal in structure.  11. The aortic valve is normal in structure. Aortic valve regurgitation is  mild. No evidence of aortic valve sclerosis or stenosis.  12. The pulmonic valve was normal in structure. Pulmonic valve  regurgitation is not visualized.  13. Mildly elevated pulmonary artery systolic pressure.  14. The inferior vena cava is normal in  size with greater than 50%  respiratory variability, suggesting right atrial pressure of 3 mmHg.   Laboratory Data:  High Sensitivity Troponin:  No results for input(s): TROPONINIHS in the last 720 hours.   Chemistry Recent Labs  Lab 06/14/19 1010 06/15/19 0744  NA 137 137  K 5.5* 5.6*  CL 101 109  CO2 18* 16*  GLUCOSE 147* 81  BUN 80* 103*  CREATININE 6.71* 7.01*  CALCIUM 7.7* 7.3*  GFRNONAA 6* 5*  GFRAA 7* 6*  ANIONGAP 18* 12    Recent Labs  Lab 06/14/19 1010  PROT 4.4*  ALBUMIN 2.1*  AST 16  ALT 13  ALKPHOS 50  BILITOT 1.0   Hematology Recent Labs  Lab 06/14/19 1010 06/14/19 1852 06/15/19 0744  WBC 7.7  --  11.3*  RBC 2.62*  --  2.93*  HGB 7.1* 6.0* 8.5*  HCT 23.9* 19.5* 25.4*  MCV 91.2  --  86.7  MCH 27.1  --  29.0  MCHC 29.7*  --  33.5  RDW 20.7*  --  16.0*  PLT 202  --  126*   BNPNo results for input(s):  BNP, PROBNP in the last 168 hours.  DDimer No results for input(s): DDIMER in the last 168 hours.   Radiology/Studies:  CT ABDOMEN PELVIS W CONTRAST  Result Date: 06/14/2019 CLINICAL DATA:  Vomiting and diarrhea beginning this morning. Abdominal pain and tenderness with palpation. EXAM: CT ABDOMEN AND PELVIS WITH CONTRAST TECHNIQUE: Multidetector CT imaging of the abdomen and pelvis was performed using the standard protocol following bolus administration of intravenous contrast. CONTRAST:  30mL OMNIPAQUE IOHEXOL 300 MG/ML  SOLN COMPARISON:  04/23/2019 FINDINGS: Lower Chest: New small bilateral pleural effusions and dependent bibasilar atelectasis. Stable cardiomegaly. Hepatobiliary: No hepatic masses identified. Gallbladder is unremarkable. No evidence of biliary ductal dilatation. Pancreas:  No mass or inflammatory changes. Spleen: Within normal limits in size and appearance. Adrenals/Urinary Tract: Stable bilateral adrenal masses, consistent with benign adenomas. Diffuse bilateral renal atrophy and tiny bilateral renal cysts remain stable. No evidence  of ureteral calculi or hydronephrosis. Unremarkable unopacified urinary bladder. Stomach/Bowel: Diffuse duodenal wall thickening and mucosal enhancement has increased since previous study, consistent with duodenitis. A small postbulbar duodenal ulcer is seen and decreased in size since previous study. No evidence of free intraperitoneal air. Vascular/Lymphatic: No pathologically enlarged lymph nodes. No abdominal aortic aneurysm. Aortic atherosclerosis noted. Reproductive: Prior hysterectomy noted. Adnexal regions are unremarkable in appearance. Other: Several small midline epigastric ventral hernias are again seen which contain only fat. No evidence of herniated bowel loops. Increased diffuse body wall edema since prior exam. Musculoskeletal:  No suspicious bone lesions identified. IMPRESSION: 1. Increased diffuse duodenal wall thickening and mucosal enhancement, consistent with duodenitis. 2. Decreased size of small postbulbar duodenal ulcer. 3. New small bilateral pleural effusions diffuse body wall edema. 4. Stable small epigastric ventral hernias containing only fat. 5. Stable benign bilateral adrenal adenomas. Aortic Atherosclerosis (ICD10-I70.0). Electronically Signed   By: Marlaine Hind M.D.   On: 06/14/2019 15:21   DG Chest Port 1 View  Result Date: 06/14/2019 CLINICAL DATA:  Weakness. Dialysis patient. EXAM: PORTABLE CHEST 1 VIEW COMPARISON:  Chest x-ray 05/22/2019 FINDINGS: The right IJ dialysis catheter is in good position, unchanged. The cardiac silhouette, mediastinal and hilar contours are within normal limits. Mild tortuosity and calcification of the thoracic aorta. No acute pulmonary findings. No pleural effusions. No pulmonary lesions. IMPRESSION: No acute cardiopulmonary findings. Electronically Signed   By: Marijo Sanes M.D.   On: 06/14/2019 10:49   { Assessment and Plan:   Paroxysmal afib - in the setting of acute anemia and GIB - patient was on  Metoprolol 100 mg BID at baseline -  Was in afib yesterday with elevated rates and IV cardizem started but stopped after patient spontaneously converted.  - Overnight she converted back to rapid afib. Does not appear any of her rate controlling medications have been re-started - CHADSVASC = 5 (HTN, CHF, CAD/PVD, age, female) - Not on anticoagulation due to history of GIB and duodenal ulcers.  - Will restart metoprolol - Given that she cannot be anticoagulated it is best to try and keep patient in sinus so will start IV amiodarone as well. Discuss with MD.   Acute Anemia/GIB - transfused 3 units. Hgb today 8.5 - suspected from heavy NSAIDS for last 4 weeks, duodenal ulcers and plavix - Plavix and aspirin held - has history of GIB and acute anemia - further work-up per IM/GI  Chronic diastolic HF - EF 16/9678 EF 45-50% with g1DD - takes lasix 3 times weekly  HTN - metoprolol 100 mg BID, amlodipine 10 mg BID, Imdur  30 mg - Imdur continued - restart metoprolol  CAD s/p multiple MI and DES x ?2 - aspirin held - EKG show in and out of afib - no chest pain  ESRD on HD - nephrology following  PVD - followed by [facility] - statin - plavix and aspirin held - plan to follow-up as outpatient   For questions or updates, please contact [facility] Please consult [email or web address] for contact info under     Signed, Cadence Ninfa Meeker, PA-C  06/15/2019 3:33 PM

## 2019-06-16 ENCOUNTER — Inpatient Hospital Stay (HOSPITAL_COMMUNITY): Payer: Medicare Other | Admitting: Anesthesiology

## 2019-06-16 ENCOUNTER — Encounter (HOSPITAL_COMMUNITY): Payer: Self-pay | Admitting: Internal Medicine

## 2019-06-16 ENCOUNTER — Encounter (HOSPITAL_COMMUNITY)
Admission: EM | Disposition: A | Discharge status: Home Health | Payer: Self-pay | Source: Home / Self Care | Attending: Internal Medicine

## 2019-06-16 DIAGNOSIS — K269 Duodenal ulcer, unspecified as acute or chronic, without hemorrhage or perforation: Secondary | ICD-10-CM

## 2019-06-16 DIAGNOSIS — K921 Melena: Secondary | ICD-10-CM

## 2019-06-16 HISTORY — PX: ESOPHAGOGASTRODUODENOSCOPY: SHX5428

## 2019-06-16 LAB — BPAM RBC
Blood Product Expiration Date: 202105222359
Blood Product Expiration Date: 202105242359
Blood Product Expiration Date: 202106092359
ISSUE DATE / TIME: 202105181208
ISSUE DATE / TIME: 202105182045
ISSUE DATE / TIME: 202105190050
Unit Type and Rh: 6200
Unit Type and Rh: 6200
Unit Type and Rh: 6200

## 2019-06-16 LAB — RENAL FUNCTION PANEL
Albumin: 1.6 g/dL — ABNORMAL LOW (ref 3.5–5.0)
Anion gap: 9 (ref 5–15)
BUN: 24 mg/dL — ABNORMAL HIGH (ref 8–23)
CO2: 27 mmol/L (ref 22–32)
Calcium: 7.2 mg/dL — ABNORMAL LOW (ref 8.9–10.3)
Chloride: 99 mmol/L (ref 98–111)
Creatinine, Ser: 2.87 mg/dL — ABNORMAL HIGH (ref 0.44–1.00)
GFR calc Af Amer: 19 mL/min — ABNORMAL LOW (ref >60)
GFR calc non Af Amer: 16 mL/min — ABNORMAL LOW (ref >60)
Glucose, Bld: 104 mg/dL — ABNORMAL HIGH (ref 70–99)
Phosphorus: 4.5 mg/dL (ref 2.5–4.6)
Potassium: 3.8 mmol/L (ref 3.5–5.1)
Sodium: 135 mmol/L (ref 135–145)

## 2019-06-16 LAB — CBC
HCT: 27.1 % — ABNORMAL LOW (ref 36.0–46.0)
Hemoglobin: 9 g/dL — ABNORMAL LOW (ref 12.0–15.0)
MCH: 28.7 pg (ref 26.0–34.0)
MCHC: 33.2 g/dL (ref 30.0–36.0)
MCV: 86.3 fL (ref 80.0–100.0)
Platelets: 136 10*3/uL — ABNORMAL LOW (ref 150–400)
RBC: 3.14 MIL/uL — ABNORMAL LOW (ref 3.87–5.11)
RDW: 17.3 % — ABNORMAL HIGH (ref 11.5–15.5)
WBC: 11.4 10*3/uL — ABNORMAL HIGH (ref 4.0–10.5)
nRBC: 0 % (ref 0.0–0.2)

## 2019-06-16 LAB — TYPE AND SCREEN
ABO/RH(D): A POS
Antibody Screen: NEGATIVE
Unit division: 0
Unit division: 0
Unit division: 0

## 2019-06-16 SURGERY — EGD (ESOPHAGOGASTRODUODENOSCOPY)
Anesthesia: Monitor Anesthesia Care

## 2019-06-16 MED ORDER — ALBUMIN HUMAN 25 % IV SOLN
INTRAVENOUS | Status: AC
  Administered 2019-06-16: 25 g via INTRAVENOUS
  Filled 2019-06-16: qty 100

## 2019-06-16 MED ORDER — PANTOPRAZOLE SODIUM 40 MG IV SOLR
40.0000 mg | Freq: Two times a day (BID) | INTRAVENOUS | Status: DC
  Administered 2019-06-16 – 2019-06-20 (×9): 40 mg via INTRAVENOUS
  Filled 2019-06-16 (×9): qty 40

## 2019-06-16 MED ORDER — ONDANSETRON HCL 4 MG/2ML IJ SOLN
INTRAMUSCULAR | Status: DC | PRN
  Administered 2019-06-16: 4 mg via INTRAVENOUS

## 2019-06-16 MED ORDER — DARBEPOETIN ALFA 200 MCG/0.4ML IJ SOSY
200.0000 ug | PREFILLED_SYRINGE | INTRAMUSCULAR | Status: DC
  Administered 2019-06-16: 200 ug via INTRAVENOUS
  Filled 2019-06-16: qty 0.4

## 2019-06-16 MED ORDER — PROPOFOL 10 MG/ML IV BOLUS
INTRAVENOUS | Status: DC | PRN
  Administered 2019-06-16: 50 mg via INTRAVENOUS
  Administered 2019-06-16: 100 mg via INTRAVENOUS

## 2019-06-16 MED ORDER — SODIUM CHLORIDE 0.9 % IV SOLN
510.0000 mg | Freq: Once | INTRAVENOUS | Status: AC
  Administered 2019-06-16: 510 mg via INTRAVENOUS
  Filled 2019-06-16: qty 17

## 2019-06-16 MED ORDER — ALBUMIN HUMAN 25 % IV SOLN: 25.0000 g | Freq: Once | INTRAVENOUS | Status: AC

## 2019-06-16 MED ORDER — HEPARIN SODIUM (PORCINE) 1000 UNIT/ML IJ SOLN
INTRAMUSCULAR | Status: AC
  Administered 2019-06-16: 3200 [IU]
  Filled 2019-06-16: qty 4

## 2019-06-16 MED ORDER — FENTANYL CITRATE (PF) 100 MCG/2ML IJ SOLN
INTRAMUSCULAR | Status: DC | PRN
  Administered 2019-06-16: 50 ug via INTRAVENOUS

## 2019-06-16 MED ORDER — MIDAZOLAM HCL 5 MG/5ML IJ SOLN: INTRAMUSCULAR | Status: DC | PRN

## 2019-06-16 MED ORDER — PROPOFOL 500 MG/50ML IV EMUL
INTRAVENOUS | Status: DC | PRN
  Administered 2019-06-16: 75 ug/kg/min via INTRAVENOUS

## 2019-06-16 MED ORDER — HYDROXYZINE HCL 25 MG PO TABS
ORAL_TABLET | ORAL | Status: AC
  Filled 2019-06-16: qty 1

## 2019-06-16 MED ORDER — DARBEPOETIN ALFA 200 MCG/0.4ML IJ SOSY
PREFILLED_SYRINGE | INTRAMUSCULAR | Status: AC
  Filled 2019-06-16: qty 0.4

## 2019-06-16 NOTE — Progress Notes (Signed)
Assessed patient upon return from dialysis. Patient found to be confused. Patient unable to state where she is; first states that she is at home and then that [place] house." Patient does not recognize this RN from prior interactions and states "I am going to just call the police and go home; you are trying to control me. I am not in the hospital. You are not my nurse. My family wants me to be at home." Patient refusing to participate in care and refusing all medications. Mental status and affect seem to be very different than yesterday and earlier during the shift.   All vital signs stable. Patient seems to be able to move all extremities equally although she will not participate in a full neuro assessment. No changes in speech noted.   Notified Dr. Rodena Piety via phone; will continue to monitor and update MD about any changes.

## 2019-06-16 NOTE — Interval H&P Note (Signed)
History and Physical Interval Note:  06/16/2019 9:08 AM  Christina [name]  has presented today for surgery, with the diagnosis of Anemia with [place] Stools and Hx of Duodenal Ulcer.  The various methods of treatment have been discussed with the patient and family. After consideration of risks, benefits and other options for treatment, the patient has consented to  Procedure(s): ESOPHAGOGASTRODUODENOSCOPY (EGD) (N/A) as a surgical intervention.  The patient's history has been reviewed, patient examined, no change in status, stable for surgery.  I have reviewed the patient's chart and labs.  Questions were answered to the patient's satisfaction.     [name]

## 2019-06-16 NOTE — Transfer of Care (Signed)
Immediate Anesthesia Transfer of Care Note  Patient: MASHELL SIEBEN  Procedure(s) Performed: ESOPHAGOGASTRODUODENOSCOPY (EGD) (N/A )  Patient Location: Endoscopy Unit  Anesthesia Type:General  Level of Consciousness: awake, alert  and oriented  Airway & Oxygen Therapy: Patient Spontanous Breathing and Patient connected to face mask oxygen  Post-op Assessment: Report given to RN and Post -op Vital signs reviewed and stable  Post vital signs: Reviewed and stable  Last Vitals:  Vitals Value Taken Time  BP 111/54 06/16/19 1023  Temp 36.1 C 06/16/19 1023  Pulse 66 06/16/19 1029  Resp 19 06/16/19 1030  SpO2 100 % 06/16/19 1029  Vitals shown include unvalidated device data.  Last Pain:  Vitals:   06/16/19 1023  TempSrc: Temporal  PainSc: 0-No pain      Patients Stated Pain Goal: 0 (29/92/42 6834)  Complications: No apparent anesthesia complications

## 2019-06-16 NOTE — Op Note (Signed)
[facility] Patient Name: Christina [name] Procedure Date : 06/16/2019 MRN: [id number] Attending MD: [name] , MD Date of Birth: 1950-02-05 CSN: [id number] Age: 69 Admit Type: Inpatient Procedure:                Upper GI endoscopy Indications:              Melena, multiple deep ulcers in duodenum EGD                            02/2019, biopsies from duodenum showed no cancer,                            biopsies from stomach showed no H. pylori. Felt                            possibly due to ischemic damage, since then she's                            had vascular surgeon stenting. Now with recurrent                            melena, abd pain. SHE HAS BEEN TAKING 10-12                            IBUPROFEN DAILY FOR AT LEAST 4 WEEKS, ALSO ALLEVE. Providers:                [name], MD, Grace Isaac, RN, Doristine Johns, RN, Theodora Blow, Technician Referring MD:              Medicines:                General Anesthesia after MAC attempt Complications:            No immediate complications. Estimated blood loss:                            None. Estimated Blood Loss:     Estimated blood loss: none. Procedure:                Pre-Anesthesia Assessment:                           - Prior to the procedure, a History and Physical                            was performed, and patient medications and                            allergies were reviewed. The patient's tolerance of                            previous anesthesia was also reviewed. The risks  and benefits of the procedure and the sedation                            options and risks were discussed with the patient.                            All questions were answered, and informed consent                            was obtained. Prior Anticoagulants: The patient has                            taken Plavix (clopidogrel), last dose was 2 days                             prior to procedure. ASA Grade Assessment: III - A                            patient with severe systemic disease. After                            reviewing the risks and benefits, the patient was                            deemed in satisfactory condition to undergo the                            procedure.                           After obtaining informed consent, the endoscope was                            passed under direct vision. Throughout the                            procedure, the patient's blood pressure, pulse, and                            oxygen saturations were monitored continuously. The                            GIF-H190 ([id number]) Olympus gastroscope was                            introduced through the mouth, and advanced to the                            second part of duodenum. The upper GI endoscopy was                            accomplished without difficulty. The patient  tolerated the procedure fairly. She immediate                            laryngospam with esophageal intubation, dets to 30s                            very briefly, responded to bag mask, decision made                            to ET intubate and then proceed. Scope In: Scope Out: Findings:      Small amount of blood at ET tube site.      No fresh or old blood in the UGI tract.      The stomach was normal.      There were multiple (5-6) deep ulcers in the duodenum (distal duodenal       bulb and just past the bulb). These ranged in size from 17m to 161m No       neoplastic appearing mucosa. No visible vessles.      The exam was otherwise without abnormality. Impression:               - Multiple deep duodenal ulcers (bulb and just                            distal to the bulb) without active bleeding or                            signs to warrant endoscopic therapy. The ulcers                            appear very similar to the images and  description                            from EGD 02/2019. At that time duodenal and gastric                            biopsies showed no sign of infection, cancer. I did                            not repeat biopsies today because the ulcers are                            almost certainly due to he extreme NSAID overuse                            (10-12 ibuprofen daily, also some alleve most                            recently). Recommendation:           - Continue IV PPI BID for another 24 hours then                            safe to change to PPI BID orally, probably  indefinitely.                           - She must absolutely never resume NSAIDs again.                           - Will start clears now. OK to advance tomorrow if                            no obvious rebleeding.                           - OK to d/c home in two days in no obvious                            rebleeding.                           - Should give her feraheme infusion this admission                            and then she should take oral iron supplement (OTC)                            once daily.                           - She should follow up with her primary GI in                            [place].                           - I am covering the hospital for the next several                            days, please call or page with any further                            questions or concerns. Procedure Code(s):        --- Professional ---                           [phone], Esophagogastroduodenoscopy, flexible,                            transoral; diagnostic, including collection of                            specimen(s) by brushing or washing, when performed                            (separate procedure) Diagnosis Code(s):        --- Professional ---  K26.9, Duodenal ulcer, unspecified as acute or                            chronic, without hemorrhage  or perforation                           K92.1, Melena (includes Hematochezia) CPT copyright 2019 American Medical Association. All rights reserved. The codes documented in this report are preliminary and upon coder review may  be revised to meet current compliance requirements. [name], MD 06/16/2019 10:12:44 AM This report has been signed electronically. Number of Addenda: 0

## 2019-06-16 NOTE — Anesthesia Procedure Notes (Signed)
Procedure Name: Intubation Date/Time: 06/16/2019 9:53 AM Performed by: Griffin Dakin, CRNA Pre-anesthesia Checklist: Patient identified, Emergency Drugs available, Suction available and Patient being monitored Patient Re-evaluated:Patient Re-evaluated prior to induction Oxygen Delivery Method: Circle system utilized Preoxygenation: Pre-oxygenation with 100% oxygen Induction Type: IV induction Laryngoscope Size: Glidescope and 3 Grade View: Grade I Tube type: Oral Number of attempts: 1 Airway Equipment and Method: Video-laryngoscopy and Rigid stylet Placement Confirmation: ETT inserted through vocal cords under direct vision,  positive ETCO2 and breath sounds checked- equal and bilateral Secured at: 22 cm Tube secured with: Tape Dental Injury: Teeth and Oropharynx as per pre-operative assessment

## 2019-06-16 NOTE — Progress Notes (Addendum)
   Carefully reviewed Dr. Ardis Hughs endoscopy report.  Anticoagulation is not a good idea at this time.  Had brief AF at 5 AM.  Continue IV amiodarone today and switch to oral loading 200 mg twice daily starting in a.m.  Purpose of this approach is to minimize episodes/burden of A. fib and decrease the risk of thromboembolic complications.  She would be a consideration for left atrial appendage occlusion.  This should likely be discussed at the very least at some point.

## 2019-06-16 NOTE — Anesthesia Preprocedure Evaluation (Signed)
Anesthesia Evaluation  Patient identified by MRN, date of birth, ID band Patient awake    Reviewed: Allergy & Precautions, H&P , NPO status , Patient's Chart, lab work & pertinent test results  Airway Mallampati: II   Neck ROM: full    Dental   Pulmonary asthma , COPD, former smoker,    breath sounds clear to auscultation       Cardiovascular hypertension, + CAD and + Peripheral Vascular Disease  + dysrhythmias Atrial Fibrillation  Rhythm:regular Rate:Normal     Neuro/Psych  Headaches, PSYCHIATRIC DISORDERS Anxiety Depression Bipolar Disorder CVA    GI/Hepatic PUD, GERD  ,  Endo/Other    Renal/GU ESRF and DialysisRenal disease     Musculoskeletal  (+) Arthritis ,   Abdominal   Peds  Hematology  (+) Blood dyscrasia, anemia ,   Anesthesia Other Findings   Reproductive/Obstetrics                             Anesthesia Physical Anesthesia Plan  ASA: III  Anesthesia Plan: MAC   Post-op Pain Management:    Induction: Intravenous  PONV Risk Score and Plan: 2 and Propofol infusion and Treatment may vary due to age or medical condition  Airway Management Planned: Nasal Cannula  Additional Equipment:   Intra-op Plan:   Post-operative Plan:   Informed Consent: I have reviewed the patients History and Physical, chart, labs and discussed the procedure including the risks, benefits and alternatives for the proposed anesthesia with the patient or authorized representative who has indicated his/her understanding and acceptance.       Plan Discussed with: CRNA and Anesthesiologist  Anesthesia Plan Comments:         Anesthesia Quick Evaluation

## 2019-06-16 NOTE — Plan of Care (Signed)

## 2019-06-16 NOTE — Progress Notes (Signed)
PROGRESS NOTE    Christina [name]  MEQ:[id number] DOB: 1950/11/01 DOA: 06/14/2019 PCP: Maggie Schwalbe, PA-C    Brief Narrative:HPI per Dr. Lorin Mercy on 06/14/2019 69 y.o.femalewith medical history significant ofSBO; HTN; HLD; depression; CAD with stents; COPD; ESRD on TTS HD; and afib presenting withhematemesis. I admitted her for abdominal pain on 1/25; this was thought to be related to missed HD and she was discharged on 1/27. She returned for abdominal pain from 1/31-2/2, again after missing HD. She had a subsequent ER visit and then hospitalization and presented again from 2/26-3/3 with abdominal pain and ABLA in the setting of cocaine use. EGD was performed and showed esophagitis, gastritis, and multiple duodenal ulcers. She was started on PPI and Carafate. She returned again on 3/8 with concern for hematemesis (hemoccult negative). 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 She also had afib with RVR. She was hospitalized again from 4/13-15 with acute respiratory failure from volume overload vs. COPD exacerbation. She was hospitalized at [facility] from 4/25-27 with afib with RVR; no AC due to large duodenal ulcer, discharged on metoprolol and Cardizem. Finally, she was hospitalized at [facility] from 5/8-9 with PAD and dry gangrene of a left foot digit; after discussion, the plan was for d/c with outpatient vascular follow up.  She reports hematemesis and large bloody BM at home this AM. She is having significant abdominal pain as well as LLE pain associated with dry gangrene. Upon arriving to the floor, she had a large bloody BM and was noted to have rectal prolapse. She reports that this happens frequently at home.   ED Course:Woke up this AM with coffee ground emesis and then had a bloody BM. Hgb 10 ->7.1. Abdominal pain out  of proportion to exam - thickening of bowel wall. Transfusing. GI to follow. Now in afib with RVR.  06/15/2019 staff reported patient had a bloody bowel movement today. She also went into A. fib RVR and converted to sinus tach. She continued to complain of abdominal pain and cramps. 06/16/2019 patient had small bowel movements overnight with maroon-colored stool.  Assessment & Plan:   Principal Problem:   Acute GI bleeding Active Problems:   ESRD (end stage renal disease) (HCC)   CAD (coronary artery disease), native coronary artery   COPD with chronic bronchitis (HCC)   Essential hypertension   Hyperlipidemia   Cocaine abuse ([place])   Superior mesenteric artery atherosclerosis (HCC)   Atrial fibrillation with rapid ventricular response ([place])   Incomplete rectal prolapse  #1 GI bleed patient has had multiple hospital admissions since the beginning of this year for the same and abdominal pain.  Hemoglobin 9.0 today. Status post EGD 06/16/2019 with multiple deep duodenal ulcers noted.  These ulcers appeared very similar to the EGD findings from February 2021.  Biopsy not done this time as the biopsies done in February showed no signs of cancer or infection.  GI recommending to continue IV PPI twice daily for another 24 hours and then changed to p.o. twice daily indefinitely. Clear liquid diet today advance diet tomorrow if no further bleeding. Will give Feraheme this admission. Will need iron supplementation on discharge.  She is status post a high-grade SMA stenosis and stent placement. Vascular surgery was consulted for concern for ischemic bowel-status post SMA stent CT angiogram consistent with patent stent.  He recommended to continue follow-up with Dr. Carlis Abbott  for a mesenteric duplex.  #2 A. fib RVR not on anticoagulation due to GI bleeding history.  Appreciate cardiology input.  Patient started on amiodarone and restarted Lopressor.   #3 ESRD on dialysis Tuesday Thursday and  Saturdays with a left upper arm fistula with a right IJ dialysis catheter in place  #4 peripheral vascular disease follow-up with vascular surgery as an outpatient.  On aspirin and Plavix which is on hold.  #5 chronic rectal prolapse patient does not want surgery done  #6 history of essential hypertension-blood pressure 106/70.monitor on amio  #7 history of COPD stable  #8 history of hyperlipidemia continue statin  #9 history of cocaine abuse  #10 hyperkalemia patient on dialysis  Estimated body mass index is 18.15 kg/m as calculated from the following:   Height as of this encounter: 5\' 2"  (1.575 m).   Weight as of this encounter: 45 kg.   DVT prophylaxis: scd Code Status: dnr Family Communication: dw patient Disposition Plan:  Status is: Inpatient  Dispo: The patient is from:home  Anticipated d/c is to: unknown  Anticipated d/c date LP:[id number]  Patient currently is not medically stable to d/c.    Consultants: GI, vascular surgery, cardiology nephrology  Procedures: None Antimicrobials none Subjective: Patient resting in bed she reports having a good night staff reported that she had 2 small bowel movements which was maroon-colored overnight  Objective: Vitals:   06/16/19 1050 06/16/19 1055 06/16/19 1057 06/16/19 1115  BP: (!) 81/33 (!) 123/42  106/70  Pulse: 61 65 65 66  Resp: 15 13 14 17   Temp:    98 F (36.7 C)  TempSrc:    Oral  SpO2: 95% 100% 100% 99%  Weight:      Height:        Intake/Output Summary (Last 24 hours) at 06/16/2019 1248 Last data filed at 06/16/2019 1016 Gross per 24 hour  Intake 1771.51 ml  Output 0 ml  Net 1771.51 ml   Filed Weights   06/16/19 0000 06/16/19 0400 06/16/19 0455  Weight: 45 kg 47 kg 45 kg    Examination:  General exam: Appears calm and comfortable  Respiratory system: Clear to auscultation. Respiratory effort normal. Cardiovascular system: S1 & S2 heard, RRR. No JVD,  murmurs, rubs, gallops or clicks. No pedal edema. Gastrointestinal system: Abdomen is nondistended, soft and nontender. No organomegaly or masses felt. Normal bowel sounds heard. Central nervous system: Alert and oriented. No focal neurological deficits. Extremities: Symmetric 5 x 5 power. Skin: No rashes, lesions or ulcers Psychiatry: Judgement and insight appear normal. Mood & affect appropriate.     Data Reviewed: I have personally reviewed following labs and imaging studies  CBC: Recent Labs  Lab 06/14/19 1010 06/14/19 1852 06/15/19 0744 06/15/19 1616 06/16/19 0644  WBC 7.7  --  11.3* 12.7* 11.4*  NEUTROABS 6.5  --   --   --   --   HGB 7.1* 6.0* 8.5* 8.2* 9.0*  HCT 23.9* 19.5* 25.4* 25.5* 27.1*  MCV 91.2  --  86.7 88.5 86.3  PLT 202  --  126* 128* 973*   Basic Metabolic Panel: Recent Labs  Lab 06/14/19 1010 06/15/19 0744  NA 137 137  K 5.5* 5.6*  CL 101 109  CO2 18* 16*  GLUCOSE 147* 81  BUN 80* 103*  CREATININE 6.71* 7.01*  CALCIUM 7.7* 7.3*   GFR: Estimated Creatinine Clearance: 5.4 mL/min (A) (by C-G formula based on SCr of 7.01 mg/dL (H)). Liver Function Tests: Recent Labs  Lab  06/14/19 1010  AST 16  ALT 13  ALKPHOS 50  BILITOT 1.0  PROT 4.4*  ALBUMIN 2.1*   Recent Labs  Lab 06/14/19 1010  LIPASE 42   No results for input(s): AMMONIA in the last 168 hours. Coagulation Profile: Recent Labs  Lab 06/14/19 1010  INR 1.2   Cardiac Enzymes: No results for input(s): CKTOTAL, CKMB, CKMBINDEX, TROPONINI in the last 168 hours. BNP (last 3 results) No results for input(s): PROBNP in the last 8760 hours. HbA1C: No results for input(s): HGBA1C in the last 72 hours. CBG: No results for input(s): GLUCAP in the last 168 hours. Lipid Profile: No results for input(s): CHOL, HDL, LDLCALC, TRIG, CHOLHDL, LDLDIRECT in the last 72 hours. Thyroid Function Tests: No results for input(s): TSH, T4TOTAL, FREET4, T3FREE, THYROIDAB in the last 72 hours. Anemia  Panel: No results for input(s): VITAMINB12, FOLATE, FERRITIN, TIBC, IRON, RETICCTPCT in the last 72 hours. Sepsis Labs: Recent Labs  Lab 06/14/19 1610 06/14/19 1852 06/15/19 0744  LATICACIDVEN 1.8 3.8* 0.8    Recent Results (from the past 240 hour(s))  SARS Coronavirus 2 by RT PCR (hospital order, performed in [facility] hospital lab) Nasopharyngeal Nasopharyngeal Swab     Status: None   Collection Time: 06/14/19  3:36 PM   Specimen: Nasopharyngeal Swab  Result Value Ref Range Status   SARS Coronavirus 2 NEGATIVE NEGATIVE Final    Comment: (NOTE) SARS-CoV-2 target nucleic acids are NOT DETECTED. The SARS-CoV-2 RNA is generally detectable in upper and lower respiratory specimens during the acute phase of infection. The lowest concentration of SARS-CoV-2 viral copies this assay can detect is 250 copies / mL. A negative result does not preclude SARS-CoV-2 infection and should not be used as the sole basis for treatment or other patient management decisions.  A negative result may occur with improper specimen collection / handling, submission of specimen other than nasopharyngeal swab, presence of viral mutation(s) within the areas targeted by this assay, and inadequate number of viral copies (<250 copies / mL). A negative result must be combined with clinical observations, patient history, and epidemiological information. Fact Sheet for Patients:   StrictlyIdeas.no Fact Sheet for Healthcare Providers: BankingDealers.co.za This test is not yet approved or cleared  by the [place] FDA and has been authorized for detection and/or diagnosis of SARS-CoV-2 by FDA under an Emergency Use Authorization (EUA).  This EUA will remain in effect (meaning this test can be used) for the duration of the COVID-19 declaration under Section 564(b)(1) of the Act, 21 U.S.C. section 360bbb-3(b)(1), unless the authorization is terminated or revoked  sooner. Performed at [facility], [address] [place], [place] [address]          Radiology Studies: CT ABDOMEN PELVIS W CONTRAST  Result Date: 06/14/2019 CLINICAL DATA:  Vomiting and diarrhea beginning this morning. Abdominal pain and tenderness with palpation. EXAM: CT ABDOMEN AND PELVIS WITH CONTRAST TECHNIQUE: Multidetector CT imaging of the abdomen and pelvis was performed using the standard protocol following bolus administration of intravenous contrast. CONTRAST:  24mL OMNIPAQUE IOHEXOL 300 MG/ML  SOLN COMPARISON:  04/23/2019 FINDINGS: Lower Chest: New small bilateral pleural effusions and dependent bibasilar atelectasis. Stable cardiomegaly. Hepatobiliary: No hepatic masses identified. Gallbladder is unremarkable. No evidence of biliary ductal dilatation. Pancreas:  No mass or inflammatory changes. Spleen: Within normal limits in size and appearance. Adrenals/Urinary Tract: Stable bilateral adrenal masses, consistent with benign adenomas. Diffuse bilateral renal atrophy and tiny bilateral renal cysts remain stable. No evidence of  ureteral calculi or hydronephrosis. Unremarkable unopacified urinary bladder. Stomach/Bowel: Diffuse duodenal wall thickening and mucosal enhancement has increased since previous study, consistent with duodenitis. A small postbulbar duodenal ulcer is seen and decreased in size since previous study. No evidence of free intraperitoneal air. Vascular/Lymphatic: No pathologically enlarged lymph nodes. No abdominal aortic aneurysm. Aortic atherosclerosis noted. Reproductive: Prior hysterectomy noted. Adnexal regions are unremarkable in appearance. Other: Several small midline epigastric ventral hernias are again seen which contain only fat. No evidence of herniated bowel loops. Increased diffuse body wall edema since prior exam. Musculoskeletal:  No suspicious bone lesions identified. IMPRESSION: 1. Increased diffuse duodenal wall thickening and mucosal  enhancement, consistent with duodenitis. 2. Decreased size of small postbulbar duodenal ulcer. 3. New small bilateral pleural effusions diffuse body wall edema. 4. Stable small epigastric ventral hernias containing only fat. 5. Stable benign bilateral adrenal adenomas. Aortic Atherosclerosis (ICD10-I70.0). Electronically Signed   By: Marlaine Hind M.D.   On: 06/14/2019 15:21        Scheduled Meds: . allopurinol  100 mg Oral Daily  . Chlorhexidine Gluconate Cloth  6 each Topical Daily  . darbepoetin (ARANESP) injection - DIALYSIS  200 mcg Intravenous Q Thu-HD  . dicyclomine  10 mg Oral TID AC & HS  . ferric citrate  630 mg Oral TID WC  . gabapentin  300 mg Oral QHS  . hydrOXYzine      . isosorbide mononitrate  30 mg Oral QHS  . metoprolol tartrate  100 mg Oral BID  . mometasone-formoterol  2 puff Inhalation BID  . pantoprazole (PROTONIX) IV  40 mg Intravenous Q12H  . pravastatin  80 mg Oral QPM  . sucralfate  1 g Oral Q6H   Continuous Infusions: . amiodarone 30 mg/hr (06/16/19 0315)     LOS: 2 days     Georgette Shell, MD  06/16/2019, 12:48 PM

## 2019-06-16 NOTE — Progress Notes (Signed)
[facility] Progress Note   Subjective:  Seen in room - had endoscopy this morning, mult duodenal ulcers visualized - on PPI BID. Cardiology has started amiodarone for A-fib with intermittent RVR. No CP/dyspnea. Was dialyzed later yesterday after missing 1 week HD, today is technically her "usual" HD day.  Objective Vitals:   06/16/19 1050 06/16/19 1055 06/16/19 1057 06/16/19 1115  BP: (!) 81/33 (!) 123/42  106/70  Pulse: 61 65 65 66  Resp: 15 13 14 17   Temp:      TempSrc:      SpO2: 95% 100% 100% 99%  Weight:      Height:       Physical Exam General: Frail woman, NAD Heart: RRR; no murmur Lungs: CTA anteriorly Abdomen: soft, non-tender Extremities: no LE edema Dialysis Access: TDC + LUE AVF + thrill  Additional Objective Labs: Basic Metabolic Panel: Recent Labs  Lab 06/14/19 1010 06/15/19 0744  NA 137 137  K 5.5* 5.6*  CL 101 109  CO2 18* 16*  GLUCOSE 147* 81  BUN 80* 103*  CREATININE 6.71* 7.01*  CALCIUM 7.7* 7.3*   Liver Function Tests: Recent Labs  Lab 06/14/19 1010  AST 16  ALT 13  ALKPHOS 50  BILITOT 1.0  PROT 4.4*  ALBUMIN 2.1*   Recent Labs  Lab 06/14/19 1010  LIPASE 42   CBC: Recent Labs  Lab 06/14/19 1010 06/14/19 1852 06/15/19 0744 06/15/19 1616 06/16/19 0644  WBC 7.7  --  11.3* 12.7* 11.4*  NEUTROABS 6.5  --   --   --   --   HGB 7.1*   < > 8.5* 8.2* 9.0*  HCT 23.9*   < > 25.4* 25.5* 27.1*  MCV 91.2  --  86.7 88.5 86.3  PLT 202  --  126* 128* 136*   < > = values in this interval not displayed.   Studies/Results: CT ABDOMEN PELVIS W CONTRAST  Result Date: 06/14/2019 CLINICAL DATA:  Vomiting and diarrhea beginning this morning. Abdominal pain and tenderness with palpation. EXAM: CT ABDOMEN AND PELVIS WITH CONTRAST TECHNIQUE: Multidetector CT imaging of the abdomen and pelvis was performed using the standard protocol following bolus administration of intravenous contrast. CONTRAST:  61mL OMNIPAQUE IOHEXOL 300 MG/ML   SOLN COMPARISON:  04/23/2019 FINDINGS: Lower Chest: New small bilateral pleural effusions and dependent bibasilar atelectasis. Stable cardiomegaly. Hepatobiliary: No hepatic masses identified. Gallbladder is unremarkable. No evidence of biliary ductal dilatation. Pancreas:  No mass or inflammatory changes. Spleen: Within normal limits in size and appearance. Adrenals/Urinary Tract: Stable bilateral adrenal masses, consistent with benign adenomas. Diffuse bilateral renal atrophy and tiny bilateral renal cysts remain stable. No evidence of ureteral calculi or hydronephrosis. Unremarkable unopacified urinary bladder. Stomach/Bowel: Diffuse duodenal wall thickening and mucosal enhancement has increased since previous study, consistent with duodenitis. A small postbulbar duodenal ulcer is seen and decreased in size since previous study. No evidence of free intraperitoneal air. Vascular/Lymphatic: No pathologically enlarged lymph nodes. No abdominal aortic aneurysm. Aortic atherosclerosis noted. Reproductive: Prior hysterectomy noted. Adnexal regions are unremarkable in appearance. Other: Several small midline epigastric ventral hernias are again seen which contain only fat. No evidence of herniated bowel loops. Increased diffuse body wall edema since prior exam. Musculoskeletal:  No suspicious bone lesions identified. IMPRESSION: 1. Increased diffuse duodenal wall thickening and mucosal enhancement, consistent with duodenitis. 2. Decreased size of small postbulbar duodenal ulcer. 3. New small bilateral pleural effusions diffuse body wall edema. 4. Stable small epigastric ventral hernias containing only fat. 5.  Stable benign bilateral adrenal adenomas. Aortic Atherosclerosis (ICD10-I70.0). Electronically Signed   By: Marlaine Hind M.D.   On: 06/14/2019 15:21   Medications: . amiodarone 30 mg/hr (06/16/19 0315)   . allopurinol  100 mg Oral Daily  . Chlorhexidine Gluconate Cloth  6 each Topical Daily  . dicyclomine   10 mg Oral TID AC & HS  . ferric citrate  630 mg Oral TID WC  . gabapentin  300 mg Oral QHS  . hydrOXYzine      . isosorbide mononitrate  30 mg Oral QHS  . metoprolol tartrate  100 mg Oral BID  . mometasone-formoterol  2 puff Inhalation BID  . pantoprazole (PROTONIX) IV  40 mg Intravenous Q12H  . pravastatin  80 mg Oral QPM  . sucralfate  1 g Oral Q6H    Dialysis Orders: TTS at [facility], 350/A1.5, EDW 40.5kg, 2K/2Ca, UFP #2, AVF + TDC, no heparin - Mircera 236mcg IV q 2 weeks (last 5/6) - Venofer 100 x 10 ordered - Calcitriol 25mcg PO q HD  Assessment/Plan: 1.  UGIB: Melena/hematemesis on admit -> given 3U PRBCs. EGD this morning with duodenal ulcers noted. 2. A-fib RVR: Recurrent, started on amiodarone by cardiology. No AC d/t GIB.  3.  Hyperkalemia: S/p HD yesterday to correct. 4.  ESRD: Usual TTS schedule - has missed 1 week HD - HD last night. Will do short HD today to get back on TTS schedule. Using both AVF and [facility] - will use AVF again today. 5.  Hypertension/volume: BP stable - no edema on exam but up per weights, 1L UFG today. 6.  Anemia (ABLA + ESRD): See #1. Will re-dose ESA today. 7.  Metabolic bone disease: CorrCa ok - resume binders once cleared to eat. 8.  Hx mesenteric ischemia: Hx SMA stent 3/31 9.  PAD: ?ischemic L 4th/5th toes.    Veneta Penton, PA-C 06/16/2019, 11:18 AM  [employer]

## 2019-06-17 ENCOUNTER — Inpatient Hospital Stay (HOSPITAL_COMMUNITY): Payer: Medicare Other

## 2019-06-17 DIAGNOSIS — Z79899 Other long term (current) drug therapy: Secondary | ICD-10-CM

## 2019-06-17 DIAGNOSIS — N186 End stage renal disease: Secondary | ICD-10-CM

## 2019-06-17 LAB — CBC
HCT: 26.7 % — ABNORMAL LOW (ref 36.0–46.0)
Hemoglobin: 8.2 g/dL — ABNORMAL LOW (ref 12.0–15.0)
MCH: 28.2 pg (ref 26.0–34.0)
MCHC: 30.7 g/dL (ref 30.0–36.0)
MCV: 91.8 fL (ref 80.0–100.0)
Platelets: 141 10*3/uL — ABNORMAL LOW (ref 150–400)
RBC: 2.91 MIL/uL — ABNORMAL LOW (ref 3.87–5.11)
RDW: 18.5 % — ABNORMAL HIGH (ref 11.5–15.5)
WBC: 6.5 10*3/uL (ref 4.0–10.5)
nRBC: 0.6 % — ABNORMAL HIGH (ref 0.0–0.2)

## 2019-06-17 LAB — RENAL FUNCTION PANEL
Albumin: 2.1 g/dL — ABNORMAL LOW (ref 3.5–5.0)
Anion gap: 13 (ref 5–15)
BUN: 17 mg/dL (ref 8–23)
CO2: 23 mmol/L (ref 22–32)
Calcium: 7.6 mg/dL — ABNORMAL LOW (ref 8.9–10.3)
Chloride: 100 mmol/L (ref 98–111)
Creatinine, Ser: 2.74 mg/dL — ABNORMAL HIGH (ref 0.44–1.00)
GFR calc Af Amer: 20 mL/min — ABNORMAL LOW (ref >60)
GFR calc non Af Amer: 17 mL/min — ABNORMAL LOW (ref >60)
Glucose, Bld: 140 mg/dL — ABNORMAL HIGH (ref 70–99)
Phosphorus: 4 mg/dL (ref 2.5–4.6)
Potassium: 4.2 mmol/L (ref 3.5–5.1)
Sodium: 136 mmol/L (ref 135–145)

## 2019-06-17 LAB — TSH: TSH: 9.456 u[IU]/mL — ABNORMAL HIGH (ref 0.350–4.500)

## 2019-06-17 LAB — AMMONIA: Ammonia: 25 umol/L (ref 9–35)

## 2019-06-17 LAB — T4, FREE: Free T4: 1.09 ng/dL (ref 0.61–1.12)

## 2019-06-17 MED ORDER — LORAZEPAM 2 MG/ML IJ SOLN
1.0000 mg | Freq: Once | INTRAMUSCULAR | Status: AC
  Administered 2019-06-17: 1 mg via INTRAVENOUS
  Filled 2019-06-17: qty 1

## 2019-06-17 MED ORDER — HALOPERIDOL LACTATE 5 MG/ML IJ SOLN: 1.0000 mg | Freq: Four times a day (QID) | INTRAMUSCULAR | Status: DC | PRN

## 2019-06-17 MED ORDER — AMIODARONE HCL 200 MG PO TABS
200.0000 mg | ORAL_TABLET | Freq: Two times a day (BID) | ORAL | Status: DC
  Administered 2019-06-17 – 2019-06-20 (×7): 200 mg via ORAL
  Filled 2019-06-17 (×7): qty 1

## 2019-06-17 NOTE — Evaluation (Signed)
Physical Therapy Evaluation Patient Details Name: Christina [name] MRN: [id number] DOB: 05-30-1950 Today's Date: 06/17/2019   History of Present Illness   69 y.o. female with medical history significant of SBO; HTN; HLD; depression; CAD with stents; COPD; ESRD on TTS HD; and afib presenting with hematemesis. Experienced bowel prolapse in ED and required 3U PRBC due to HGB below 7. Admitted 06/14/19 for treatment of GI bleed and Afib with RVR  Clinical Impression  Evaluation limited by pt AMS. Pt found supine in bed half on half off bed pan. Pt min A for rolling but requires maxA for sequencing and command follow. Pt min A to come to seated EoB, only sat for 20 sec before laying back down in bed. Pt closed eyes and did not provide any more interaction with therapist despite maximal encouragement. PT recommending SNF level rehab at discharge. PT will continue to follow acutely.    Follow Up Recommendations SNF    Equipment Recommendations  Other (comment)(TBD at next venue)    Recommendations for Other Services       Precautions / Restrictions Precautions Precautions: Fall Restrictions Weight Bearing Restrictions: No      Mobility  Bed Mobility Overal bed mobility: Needs Assistance Bed Mobility: Rolling;Supine to Sit;Sit to Supine Rolling: Min assist;Max assist   Supine to sit: Min assist Sit to supine: Min guard   General bed mobility comments: min physical assist for rolling to remove bed pan. min A for bringing LE off bed and for bringing trunk to upright. only sits for about 20 seconds before returning to supine without assist and closes eyes no longer interacting with PT  Transfers                 General transfer comment: refuses                                     Balance Overall balance assessment: Needs assistance Sitting-balance support: Feet unsupported;Single extremity supported Sitting balance-Leahy Scale: Zero Sitting balance -  Comments: only sits for about 20 seconds with outside support                                      Pertinent Vitals/Pain Pain Assessment: Faces Faces Pain Scale: Hurts a little bit Pain Location: generalized with movement Pain Descriptors / Indicators: Grimacing;Guarding;Moaning Pain Intervention(s): Limited activity within patient's tolerance;Monitored during session;Repositioned    Home Living Family/patient expects to be discharged to:: Private residence Living Arrangements: Children Available Help at Discharge: Family;Available 24 hours/day Type of Home: House Home Access: Level entry     Home Layout: One level Home Equipment: Walker - 2 wheels;Cane - single point Additional Comments: pt with AMS but able to answer questions that agree with home set up from prior hospitalization    Prior Function Level of Independence: Independent         Comments: reports independence but unsure this is correct based on limited evaluation         Extremity/Trunk Assessment   Upper Extremity Assessment Upper Extremity Assessment: Overall WFL for tasks assessed    Lower Extremity Assessment Lower Extremity Assessment: Difficult to assess due to impaired cognition       Communication   Communication: No difficulties  Cognition Arousal/Alertness: Lethargic;Suspect due to medications Behavior During Therapy: Impulsive Overall Cognitive Status: Impaired/Different from  baseline Area of Impairment: Following commands;Safety/judgement;Awareness;Orientation;Problem solving;Memory;Attention                 Orientation Level: Disoriented to;Time;Situation Current Attention Level: Selective Memory: Decreased short-term memory Following Commands: Follows one step commands inconsistently;Follows multi-step commands inconsistently Safety/Judgement: Decreased awareness of safety Awareness: Emergent Problem Solving: Slow processing;Difficulty sequencing;Requires  verbal cues;Requires tactile cues General Comments: pt found laying half on half off of bed pan, unaware has difficulty problem solving how to roll to side to get off despite multimodal cues, answers some questions appropiately and others not       General Comments General comments (skin integrity, edema, etc.): VSS on RA        Assessment/Plan    PT Assessment Patient needs continued PT services  PT Problem List Decreased cognition;Decreased coordination;Decreased mobility;Decreased balance;Decreased activity tolerance;Decreased strength;Decreased safety awareness;Pain       PT Treatment Interventions DME instruction;Gait training;Functional mobility training;Therapeutic activities;Therapeutic exercise;Balance training;Cognitive remediation;Patient/family education    PT Goals (Current goals can be found in the Care Plan section)  Acute Rehab PT Goals Patient Stated Goal: none stated PT Goal Formulation: Patient unable to participate in goal setting Time For Goal Achievement: 07/01/19 Potential to Achieve Goals: Fair    Frequency Min 2X/week    AM-PAC PT "6 Clicks" Mobility  Outcome Measure Help needed turning from your back to your side while in a flat bed without using bedrails?: A Lot Help needed moving from lying on your back to sitting on the side of a flat bed without using bedrails?: A Little Help needed moving to and from a bed to a chair (including a wheelchair)?: Total Help needed standing up from a chair using your arms (e.g., wheelchair or bedside chair)?: Total Help needed to walk in hospital room?: Total Help needed climbing 3-5 steps with a railing? : Total 6 Click Score: 9    End of Session   Activity Tolerance: Treatment limited secondary to agitation Patient left: in bed;with call bell/phone within reach;with bed alarm set Nurse Communication: Mobility status PT Visit Diagnosis: Unsteadiness on feet (R26.81);Other abnormalities of gait and mobility  (R26.89);Muscle weakness (generalized) (M62.81);Difficulty in walking, not elsewhere classified (R26.2)    Time: 1131-1141 PT Time Calculation (min) (ACUTE ONLY): 10 min   Charges:   PT Evaluation $PT Eval Moderate Complexity: 1 Mod          Zahria Ding B. Migdalia Dk PT, DPT Acute Rehabilitation Services Pager [phone] Office [phone]   [place] 06/17/2019, 3:03 PM

## 2019-06-17 NOTE — TOC Initial Note (Signed)
Transition of Care [facility]) - Initial/Assessment Note    Patient Details  Name: Christina [name] MRN: [id number] Date of Birth: 1950/11/09  Transition of Care [facility]) CM/SW Contact:    Bethena Roys, RN Phone Number: 06/17/2019, 1:57 PM  Clinical Narrative: Risk for readmission assessment completed. Presented for acute GI Bleed- prior to admission patient was from home with support of her daughter. Patient has primary care provider and she uses [facility]. Patient will need physical therapy and occupational therapy to consult for recommendations. Patient wants to return home with home health services. Medicare.gov list provided to patient to review. Case Manager will continue to follow for additional transition of care needs.                Expected Discharge Plan: [place] Barriers to Discharge: Continued Medical Work up  Patient Goals and CMS Choice Patient states their goals for this hospitalization and ongoing recovery are:: to return home with home health CMS Medicare.gov Compare Post Acute Care list provided to:: Patient Choice offered to / list presented to : Patient  Expected Discharge Plan and Services Expected Discharge Plan: [place] In-house Referral: NA Discharge Planning Services: CM Consult Post Acute Care Choice: Home Health(Patient has used [facility] in the past.) Living arrangements for the past 2 months: Single Family Home  Prior Living Arrangements/Services Living arrangements for the past 2 months: Single Family Home Lives with:: Adult Children Patient language and need for interpreter reviewed:: Yes        Need for Family Participation in Patient Care: Yes (Comment) Care giver support system in place?: Yes (comment)   Criminal Activity/Legal Involvement Pertinent to Current Situation/Hospitalization: No - Comment as needed  Activities of Daily Living Home Assistive  Devices/Equipment: Cane (specify quad or straight), Walker (specify type) ADL Screening (condition at time of admission) Patient's cognitive ability adequate to safely complete daily activities?: Yes Is the patient deaf or have difficulty hearing?: No Does the patient have difficulty seeing, even when wearing glasses/contacts?: No Does the patient have difficulty concentrating, remembering, or making decisions?: No Patient able to express need for assistance with ADLs?: Yes Does the patient have difficulty dressing or bathing?: No Independently performs ADLs?: Yes (appropriate for developmental age) Does the patient have difficulty walking or climbing stairs?: Yes Weakness of Legs: Both Weakness of Arms/Hands: None  Permission Sought/Granted Permission sought to share information with : Family Supports    Emotional Assessment Appearance:: Appears stated age Attitude/Demeanor/Rapport: Engaged Affect (typically observed): Appropriate Orientation: : Oriented to Self, Oriented to Place, Oriented to  Time, Oriented to Situation Alcohol / Substance Use: Not Applicable Psych Involvement: No (comment)  Admission diagnosis:  Hematochezia [K92.1] Acute abdominal pain [R10.9] Weakness [R53.1] AF (paroxysmal atrial fibrillation) (HCC) [I48.0] Atrial fibrillation with rapid ventricular response (HCC) [I48.91] End-stage renal disease on hemodialysis ([place]) [N18.6, Z99.2] Patient Active Problem List   Diagnosis Date Noted  . Atrial fibrillation with rapid ventricular response ([place]) 06/14/2019  . Incomplete rectal prolapse 06/14/2019  . Respiratory failure with hypoxia ([place]) 05/10/2019  . Pneumonia 05/10/2019  . 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 Duodenal ulcer perforation ([place]) 04/23/2019  . Cocaine abuse ([place]) 04/23/2019  . Acute gastric ulcer without hemorrhage or perforation   . Hematemesis 04/04/2019  . Atrial fibrillation with RVR ([place])   . Duodenal ulcer   .  Gastritis and gastroduodenitis   . Acute blood loss anemia   . Ischemic  bowel disease ([place]) 03/25/2019  . RUQ pain   . Anemia due to chronic kidney disease, on chronic dialysis ([place])   . AF (paroxysmal atrial fibrillation) ([place])   . Chest pain 03/19/2019  . Hyperkalemia 02/28/2019  . Uremia 02/27/2019  . Volume overload 02/21/2019  . Abdominal pain 02/21/2019  . Non-compliance with renal dialysis ([place]) 02/21/2019  . Respiratory failure ([place]) 02/04/2019  . Acute and chr resp failure, unsp w hypoxia or hypercapnia ([place])   . Afib ([place]) 01/25/2019  . SBO (small bowel obstruction) ([place]) 08/10/2018  . Anemia due to chronic blood loss   . Heme positive stool   . Platelet inhibition due to Plavix   . Acute GI bleeding 05/06/2018  . ESRD (end stage renal disease) ([place]) 05/06/2018  . Anxiety 05/06/2018  . Bipolar affective ([place]) 05/06/2018  . CAD (coronary artery disease), native coronary artery 05/06/2018  . COPD with chronic bronchitis ([place]) 05/06/2018  . CVA (cerebral vascular accident) ([place]) 05/06/2018  . Essential hypertension 05/06/2018  . Hyperlipidemia 05/06/2018   PCP:  Maggie Schwalbe, PA-C Pharmacy:  No Pharmacies Listed   Readmission Risk Interventions Readmission Risk Prevention Plan 06/17/2019 03/29/2019 02/07/2019  Transportation Screening Complete Complete Complete  Medication Review Press photographer) Complete Complete Complete  PCP or Specialist appointment within 3-5 days of discharge - - Complete  HRI or [place] Complete Complete Complete  SW Recovery Care/Counseling Consult Complete Complete Complete  Palliative Care Screening Not Applicable Not Applicable Not [place] (No Data) Not Applicable Not Applicable  Some recent data might be hidden

## 2019-06-17 NOTE — Progress Notes (Addendum)
[facility] Progress Note   Subjective:   Seen in room - sleepy and angry upon waking. Per RN, got very confused last night after coming back from dialysis. Was agitated/yelling, then had BM on floor, ended up getting ativan to calm her.  Objective Vitals:   06/16/19 1948 06/16/19 2214 06/17/19 0807 06/17/19 0814  BP: (!) 133/54 (!) 128/57 (!) 123/59   Pulse: 66 64 (!) 54   Resp: 16     Temp: 98.9 F (37.2 C)  (!) 97.1 F (36.2 C)   TempSrc: Oral  Axillary   SpO2: 95%  93% 91%  Weight:      Height:       Physical Exam General: Frail woman, agitated today Heart: RRR; 4/6 murmur noted Lungs: CTA anteriorly Abdomen: soft, non-tender Extremities: no LE edema; ?ischemic changes to B 5th toes Dialysis Access: TDC + LUE AVF + thrill  Additional Objective Labs: Basic Metabolic Panel: Recent Labs  Lab 06/14/19 1010 06/15/19 0744 06/16/19 1257  NA 137 137 135  K 5.5* 5.6* 3.8  CL 101 109 99  CO2 18* 16* 27  GLUCOSE 147* 81 104*  BUN 80* 103* 24*  CREATININE 6.71* 7.01* 2.87*  CALCIUM 7.7* 7.3* 7.2*  PHOS  --   --  4.5   Liver Function Tests: Recent Labs  Lab 06/14/19 1010 06/16/19 1257  AST 16  --   ALT 13  --   ALKPHOS 50  --   BILITOT 1.0  --   PROT 4.4*  --   ALBUMIN 2.1* 1.6*   Recent Labs  Lab 06/14/19 1010  LIPASE 42   CBC: Recent Labs  Lab 06/14/19 1010 06/14/19 1852 06/15/19 0744 06/15/19 1616 06/16/19 0644  WBC 7.7  --  11.3* 12.7* 11.4*  NEUTROABS 6.5  --   --   --   --   HGB 7.1*   < > 8.5* 8.2* 9.0*  HCT 23.9*   < > 25.4* 25.5* 27.1*  MCV 91.2  --  86.7 88.5 86.3  PLT 202  --  126* 128* 136*   < > = values in this interval not displayed.   Medications: . amiodarone 30 mg/hr (06/17/19 0500)   . allopurinol  100 mg Oral Daily  . Chlorhexidine Gluconate Cloth  6 each Topical Daily  . darbepoetin (ARANESP) injection - DIALYSIS  200 mcg Intravenous Q Thu-HD  . dicyclomine  10 mg Oral TID AC & HS  . ferric citrate  630  mg Oral TID WC  . gabapentin  300 mg Oral QHS  . isosorbide mononitrate  30 mg Oral QHS  . metoprolol tartrate  100 mg Oral BID  . mometasone-formoterol  2 puff Inhalation BID  . pantoprazole (PROTONIX) IV  40 mg Intravenous Q12H  . pravastatin  80 mg Oral QPM  . sucralfate  1 g Oral Q6H    Dialysis Orders: TTS at [facility], 350/A1.5, EDW 40.5kg, 2K/2Ca, UFP #2, AVF + TDC, no heparin - Mircera 256mcg IV q 2 weeks (last 5/6) - Venofer 100 x 10 ordered - Calcitriol 12mcg PO q HD  Assessment/Plan: 1. UGIB: Melena/hematemesis on admit -> given 3U PRBCs. EGD 5/20 with duodenal ulcers noted. 2. A-fib AOZ:[id number], started on amiodarone by cardiology. No AC d/t GIB.  3. Hyperkalemia: S/p HD 5/19 to correct. 4. ESRD:Usual TTS schedule - had missed 1 week HD. HD 5/19, then again 5/20 per usual schedule. Next HD planned for 5/21.  5. Hypertension/volume:BP stable - no  edema on exam but up per weights - UF as tolerated. 6. Anemia(ABLA + ESRD):See #1. Aranesp 238mcg given 6/12. 7. Metabolic bone disease:CorrCa ok - resume binders once cleared to eat. 8. Hx mesenteric ischemia: Hx SMA stent 3/31 9. PAD: ?ischemic L 4th/5th toes. 10. AMS: New onset 5/20 - unclear trigger. WBC up slightly yesterday - will order labs for today (CBC/RFP/urinalysis); consider infectious w/u with CXR + UCx + BCx. Ordered blood cx's w/ TDC in place. Admit CXR negative.    Veneta Penton, PA-C 06/17/2019, 9:46 AM  [facility]  Pt seen, examined and agree w assess/plan as above with additions as indicated.  [facility] 06/17/2019, 10:12 AM

## 2019-06-17 NOTE — Care Management Important Message (Signed)
Important Message  Patient Details  Name: Christina [name] MRN: [id number] Date of Birth: 04-10-1950   Medicare Important Message Given:  Yes     Shelda Altes 06/17/2019, 11:03 AM

## 2019-06-17 NOTE — Progress Notes (Addendum)
PROGRESS NOTE    Christina [name]  ZOX:[id number] DOB: 03-Aug-1950 DOA: 06/14/2019 PCP: Maggie Schwalbe, PA-C    Brief Narrative:69 y.o.femalewith medical history significant ofSBO; HTN; HLD; depression; CAD with stents; COPD; ESRD on TTS HD; and afib presenting withhematemesis. I admitted her for abdominal pain on 1/25; this was thought to be related to missed HD and she was discharged on 1/27. She returned for abdominal pain from 1/31-2/2, again after missing HD. She had a subsequent ER visit and then hospitalization and presented again from 2/26-3/3 with abdominal pain and ABLA in the setting of cocaine use. EGD was performed and showed esophagitis, gastritis, and multiple duodenal ulcers. She was started on PPI and Carafate. She returned again on 3/8 with concern for hematemesis (hemoccult negative). 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 She also had afib with RVR. She was hospitalized again from 4/13-15 with acute respiratory failure from volume overload vs. COPD exacerbation. She was hospitalized at [facility] from 4/25-27 with afib with RVR; no AC due to large duodenal ulcer, discharged on metoprolol and Cardizem. Finally, she was hospitalized at [facility] from 5/8-9 with PAD and dry gangrene of a left foot digit; after discussion, the plan was for d/c with outpatient vascular follow up.  She reports hematemesis and large bloody BM at home this AM. She is having significant abdominal pain as well as LLE pain associated with dry gangrene. Upon arriving to the floor, she had a large bloody BM and was noted to have rectal prolapse. She reports that this happens frequently at home.   ED Course:Woke up this AM with coffee ground emesis and then had a bloody BM. Hgb 10 ->7.1. Abdominal pain out of proportion to exam -  thickening of bowel wall. Transfusing. GI to follow. Now in afib with RVR.  06/15/2019 staff reported patient had a bloody bowel movement today. She also went into A. fib RVR and converted to sinus tach. She continued to complain of abdominal pain and cramps. 06/16/2019 patient had small bowel movements overnight with maroon-colored stool.  Assessment & Plan:   Principal Problem:   Acute GI bleeding Active Problems:   ESRD (end stage renal disease) (HCC)   CAD (coronary artery disease), native coronary artery   COPD with chronic bronchitis (HCC)   Essential hypertension   Hyperlipidemia   Cocaine abuse ([place])   Superior mesenteric artery atherosclerosis (HCC)   Atrial fibrillation with rapid ventricular response ([place])   Incomplete rectal prolapse    #1 GI bleed patient has had multiple hospital admissions since the beginning of this year for the same and abdominal pain.  Hemoglobin 8.2 today down from 9 yesterday  Status post EGD 06/16/2019 with multiple deep duodenal ulcers noted.  These ulcers appeared very similar to the EGD findings from February 2021.  Biopsy not done this time as the biopsies done in February showed no signs of cancer or infection.    Will change Protonix to p.o. twice a day.  Continue Protonix indefinitely. Received a dose of Feraheme 06/16/2019 Will need iron supplementation on discharge.  She is status post a high-grade SMA stenosis and stent placement. Vascular surgery was consulted for concern for ischemic bowel-status post SMA stent CT angiogram consistent with patent stent.He recommended to continue follow-up with Dr. Carlis Abbott for a mesenteric duplex.  #2 A. fib RVR not on anticoagulation due to GI bleeding history.  Appreciate cardiology input.  Patient started on amiodarone and restarted Lopressor.  #3 ESRD on dialysis Tuesday Thursday and Saturdays with a left upper arm fistula with a right IJ dialysis catheter in place  #4 peripheral vascular  disease follow-up with vascular surgery as an outpatient. On aspirin and Plavix which is on hold.  #5 chronic rectal prolapse patient does not want surgery done  #6 history of essential hypertension-blood pressure 106/70.monitor on amio  #7 history of COPD stable  #8 history of hyperlipidemia continue statin  #9 history of cocaine abuse  #10 hyperkalemia patient on dialysis potassium 4.2  #11 confusion staff reports he was confused after dialysis since yesterday.  Received a milligram of Ativan overnight to calm her down.  Today she is more sleepy upset hard to wake up. Check chest xray-pulmonary vascular congestion and minimal pulmonary edema Blood culture done  Avoid narcotics Addendum- ct head-Mild atrophy. Extensive supratentorial small vessel disease. 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 And head of caudate nucleus on the right. Suspect more recent and potentially acute focal infarct in the left thalamus. Lacunar type infarcts noted in the cerebellar hemispheres bilaterally, more on the right than on the left. No mass or hemorrhage.Foci of arterial vascular calcification noted at multiple levels.  Probable cerumen in each external auditory canal. Deviated nasal Septum.  dw neuro--CHECK MRI BRAIN  Ammonia normal   Estimated body mass index is 19.48 kg/m as calculated from the following:   Height as of this encounter: 5\' 2"  (1.575 m).   Weight as of this encounter: 48.3 kg.  DVT prophylaxis:scd Code Status:dnr Family Communication:dw patient Disposition Plan:Status is: Inpatient  Dispo: The patient is from:home Anticipated d/c is NA:TFTDDUK Anticipated d/c date GU:RKYHCWC Patient currentlyis not medically stable to d/c.  Patient admitted with GI bleed now with increasing  confusion   Consultants:GI, vascular surgery, cardiology nephrology  Procedures: None Antimicrobialsnone  Subjective:  Very sleepy hard to wake her up still groggy not able to answer any questions Objective: Vitals:   06/17/19 0814 06/17/19 1058 06/17/19 1100 06/17/19 1200  BP:   132/61 (!) 123/49  Pulse:  64  (!) 59  Resp:   16 15  Temp:    98 F (36.7 C)  TempSrc:    Axillary  SpO2: 91%     Weight:      Height:        Intake/Output Summary (Last 24 hours) at 06/17/2019 1402 Last data filed at 06/17/2019 0300 Gross per 24 hour  Intake 1494.48 ml  Output 1000 ml  Net 494.48 ml   Filed Weights   06/16/19 0455 06/16/19 1310 06/16/19 1547  Weight: 45 kg 50.6 kg 48.3 kg    Examination:  General exam: Appears calm and comfortable  Respiratory system: Clear to auscultation. Respiratory effort normal. Cardiovascular system: S1 & S2 heard, RRR. No JVD, murmurs, rubs, gallops or clicks. No pedal edema. Gastrointestinal system: Abdomen is nondistended, soft and nontender. No organomegaly or masses felt. Normal bowel sounds heard. Central nervous system: Confused  extremities: Symmetric 5 x 5 power. Skin: No rashes, lesions or ulcers Psychiatry: Unable to assess    Data Reviewed: I have personally reviewed following labs and imaging studies  CBC: Recent Labs  Lab 06/14/19 1010 06/14/19 1010 06/14/19 1852 06/15/19 0744 06/15/19 1616 06/16/19 0644 06/17/19 1130  WBC 7.7  --   --  11.3* 12.7* 11.4* 6.5  NEUTROABS 6.5  --   --   --   --   --   --  HGB 7.1*   < > 6.0* 8.5* 8.2* 9.0* 8.2*  HCT 23.9*   < > 19.5* 25.4* 25.5* 27.1* 26.7*  MCV 91.2  --   --  86.7 88.5 86.3 91.8  PLT 202  --   --  126* 128* 136* 141*   < > = values in this interval not displayed.   Basic Metabolic Panel: Recent Labs  Lab 06/14/19 1010 06/15/19 0744 06/16/19 1257 06/17/19 1130  NA 137 137 135 136  K 5.5* 5.6* 3.8 4.2  CL 101 109 99 100  CO2 18* 16* 27 23  GLUCOSE 147* 81  104* 140*  BUN 80* 103* 24* 17  CREATININE 6.71* 7.01* 2.87* 2.74*  CALCIUM 7.7* 7.3* 7.2* 7.6*  PHOS  --   --  4.5 4.0   GFR: Estimated Creatinine Clearance: 14.8 mL/min (A) (by C-G formula based on SCr of 2.74 mg/dL (H)). Liver Function Tests: Recent Labs  Lab 06/14/19 1010 06/16/19 1257 06/17/19 1130  AST 16  --   --   ALT 13  --   --   ALKPHOS 50  --   --   BILITOT 1.0  --   --   PROT 4.4*  --   --   ALBUMIN 2.1* 1.6* 2.1*   Recent Labs  Lab 06/14/19 1010  LIPASE 42   No results for input(s): AMMONIA in the last 168 hours. Coagulation Profile: Recent Labs  Lab 06/14/19 1010  INR 1.2   Cardiac Enzymes: No results for input(s): CKTOTAL, CKMB, CKMBINDEX, TROPONINI in the last 168 hours. BNP (last 3 results) No results for input(s): PROBNP in the last 8760 hours. HbA1C: No results for input(s): HGBA1C in the last 72 hours. CBG: No results for input(s): GLUCAP in the last 168 hours. Lipid Profile: No results for input(s): CHOL, HDL, LDLCALC, TRIG, CHOLHDL, LDLDIRECT in the last 72 hours. Thyroid Function Tests: Recent Labs    06/17/19 1130  TSH 9.456*   Anemia Panel: No results for input(s): VITAMINB12, FOLATE, FERRITIN, TIBC, IRON, RETICCTPCT in the last 72 hours. Sepsis Labs: Recent Labs  Lab 06/14/19 1610 06/14/19 1852 06/15/19 0744  LATICACIDVEN 1.8 3.8* 0.8    Recent Results (from the past 240 hour(s))  SARS Coronavirus 2 by RT PCR (hospital order, performed in [facility] hospital lab) Nasopharyngeal Nasopharyngeal Swab     Status: None   Collection Time: 06/14/19  3:36 PM   Specimen: Nasopharyngeal Swab  Result Value Ref Range Status   SARS Coronavirus 2 NEGATIVE NEGATIVE Final    Comment: (NOTE) SARS-CoV-2 target nucleic acids are NOT DETECTED. The SARS-CoV-2 RNA is generally detectable in upper and lower respiratory specimens during the acute phase of infection. The lowest concentration of SARS-CoV-2 viral copies this assay can detect is  250 copies / mL. A negative result does not preclude SARS-CoV-2 infection and should not be used as the sole basis for treatment or other patient management decisions.  A negative result may occur with improper specimen collection / handling, submission of specimen other than nasopharyngeal swab, presence of viral mutation(s) within the areas targeted by this assay, and inadequate number of viral copies (<250 copies / mL). A negative result must be combined with clinical observations, patient history, and epidemiological information. Fact Sheet for Patients:   StrictlyIdeas.no Fact Sheet for Healthcare Providers: BankingDealers.co.za This test is not yet approved or cleared  by the [place] FDA and has been authorized for detection and/or diagnosis of SARS-CoV-2 by FDA under an Emergency Use  Authorization (EUA).  This EUA will remain in effect (meaning this test can be used) for the duration of the COVID-19 declaration under Section 564(b)(1) of the Act, 21 U.S.C. section 360bbb-3(b)(1), unless the authorization is terminated or revoked sooner. Performed at [facility], [place] [address], [place], [place] [address]          Radiology Studies: No results found.      Scheduled Meds: . allopurinol  100 mg Oral Daily  . amiodarone  200 mg Oral BID  . Chlorhexidine Gluconate Cloth  6 each Topical Daily  . darbepoetin (ARANESP) injection - DIALYSIS  200 mcg Intravenous Q Thu-HD  . dicyclomine  10 mg Oral TID AC & HS  . ferric citrate  630 mg Oral TID WC  . gabapentin  300 mg Oral QHS  . isosorbide mononitrate  30 mg Oral QHS  . metoprolol tartrate  100 mg Oral BID  . mometasone-formoterol  2 puff Inhalation BID  . pantoprazole (PROTONIX) IV  40 mg Intravenous Q12H  . pravastatin  80 mg Oral QPM  . sucralfate  1 g Oral Q6H   Continuous Infusions:   LOS: 3 days     Georgette Shell, MD 06/17/2019, 2:02 PM

## 2019-06-17 NOTE — Plan of Care (Signed)
  Problem: Nutrition: Goal: Adequate nutrition will be maintained Outcome: Progressing   Problem: Coping: Goal: Level of anxiety will decrease Outcome: Progressing   Problem: Pain Managment: Goal: General experience of comfort will improve Outcome: Progressing   Problem: Safety: Goal: Ability to remain free from injury will improve Outcome: Progressing   

## 2019-06-17 NOTE — Progress Notes (Addendum)
Progress Note  Patient Name: Christina [name] Date of Encounter: 06/17/2019  Primary Cardiologist: No primary care provider on file. Dr. Otho Perl at [facility]  Subjective   No cardiac complaints.  No significant recurrence of atrial fibrillation overnight.  Inpatient Medications    Scheduled Meds: . allopurinol  100 mg Oral Daily  . amiodarone  200 mg Oral BID  . Chlorhexidine Gluconate Cloth  6 each Topical Daily  . darbepoetin (ARANESP) injection - DIALYSIS  200 mcg Intravenous Q Thu-HD  . dicyclomine  10 mg Oral TID AC & HS  . ferric citrate  630 mg Oral TID WC  . gabapentin  300 mg Oral QHS  . isosorbide mononitrate  30 mg Oral QHS  . metoprolol tartrate  100 mg Oral BID  . mometasone-formoterol  2 puff Inhalation BID  . pantoprazole (PROTONIX) IV  40 mg Intravenous Q12H  . pravastatin  80 mg Oral QPM  . sucralfate  1 g Oral Q6H   Continuous Infusions:  PRN Meds: acetaminophen **OR** acetaminophen, albuterol, calcium carbonate (dosed in mg elemental calcium), camphor-menthol **AND** hydrOXYzine, diphenhydrAMINE, docusate sodium, feeding supplement (NEPRO CARB STEADY), fentaNYL (SUBLIMAZE) injection, ipratropium-albuterol, ondansetron **OR** ondansetron (ZOFRAN) IV, sorbitol   Vital Signs    Vitals:   06/16/19 1948 06/16/19 2214 06/17/19 0807 06/17/19 0814  BP: (!) 133/54 (!) 128/57 (!) 123/59   Pulse: 66 64 (!) 54   Resp: 16     Temp: 98.9 F (37.2 C)  (!) 97.1 F (36.2 C)   TempSrc: Oral  Axillary   SpO2: 95%  93% 91%  Weight:      Height:        Intake/Output Summary (Last 24 hours) at 06/17/2019 1046 Last data filed at 06/17/2019 0300 Gross per 24 hour  Intake 1494.48 ml  Output 1000 ml  Net 494.48 ml   Last 3 Weights 06/16/2019 06/16/2019 06/16/2019  Weight (lbs) 106 lb 7.7 oz 111 lb 8.8 oz 99 lb 3.3 oz  Weight (kg) 48.3 kg 50.6 kg 45 kg      Telemetry    Sinus rhythm and bradycardia without A. fib- Personally Reviewed  ECG    Not repeated-  Personally Reviewed  Physical Exam  Frail and chronically ill-appearing GEN:  Pale Neck: No JVD Cardiac: RRR, no murmurs, rubs, or gallops.  Respiratory: Clear to auscultation bilaterally. GI: Soft, nontender, non-distended  MS: No edema; No deformity. Neuro:  Nonfocal  Psych: Normal affect   Labs    High Sensitivity Troponin:  No results for input(s): TROPONINIHS in the last 720 hours.    Chemistry Recent Labs  Lab 06/14/19 1010 06/15/19 0744 06/16/19 1257  NA 137 137 135  K 5.5* 5.6* 3.8  CL 101 109 99  CO2 18* 16* 27  GLUCOSE 147* 81 104*  BUN 80* 103* 24*  CREATININE 6.71* 7.01* 2.87*  CALCIUM 7.7* 7.3* 7.2*  PROT 4.4*  --   --   ALBUMIN 2.1*  --  1.6*  AST 16  --   --   ALT 13  --   --   ALKPHOS 50  --   --   BILITOT 1.0  --   --   GFRNONAA 6* 5* 16*  GFRAA 7* 6* 19*  ANIONGAP 18* 12 9     Hematology Recent Labs  Lab 06/15/19 0744 06/15/19 1616 06/16/19 0644  WBC 11.3* 12.7* 11.4*  RBC 2.93* 2.88* 3.14*  HGB 8.5* 8.2* 9.0*  HCT 25.4* 25.5* 27.1*  MCV 86.7  88.5 86.3  MCH 29.0 28.5 28.7  MCHC 33.5 32.2 33.2  RDW 16.0* 17.5* 17.3*  PLT 126* 128* 136*    BNPNo results for input(s): BNP, PROBNP in the last 168 hours.   DDimer No results for input(s): DDIMER in the last 168 hours.   Radiology    No results found.  Cardiac Studies   No new data  Patient Profile     69 y.o. 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 Rodena Piety  Assessment & Plan    1. Paroxysmal atrial fibrillation, being loaded with amiodarone to suppress A. fib burden. Will need to decrease intensity of metoprolol as she loads with  Amiodarone to avoid bradycardia. 2. IV amiodarone will be converted to oral therapy today. 3. Anticoagulation is not being used because of recurrent GI bleeding. 4. Hypokalemia: very important to keep potassium level > 3.8.       For questions or updates, please contact [place] Please consult [email or web address] for contact info under        Signed, Sinclair Grooms, MD  06/17/2019, 10:46 AM

## 2019-06-18 ENCOUNTER — Inpatient Hospital Stay (HOSPITAL_COMMUNITY): Payer: Medicare Other

## 2019-06-18 DIAGNOSIS — I639 Cerebral infarction, unspecified: Secondary | ICD-10-CM

## 2019-06-18 LAB — COMPREHENSIVE METABOLIC PANEL
ALT: 12 U/L (ref 0–44)
AST: 21 U/L (ref 15–41)
Albumin: 2.1 g/dL — ABNORMAL LOW (ref 3.5–5.0)
Alkaline Phosphatase: 49 U/L (ref 38–126)
Anion gap: 9 (ref 5–15)
BUN: 21 mg/dL (ref 8–23)
CO2: 25 mmol/L (ref 22–32)
Calcium: 7.6 mg/dL — ABNORMAL LOW (ref 8.9–10.3)
Chloride: 95 mmol/L — ABNORMAL LOW (ref 98–111)
Creatinine, Ser: 3.59 mg/dL — ABNORMAL HIGH (ref 0.44–1.00)
GFR calc Af Amer: 14 mL/min — ABNORMAL LOW (ref >60)
GFR calc non Af Amer: 12 mL/min — ABNORMAL LOW (ref >60)
Glucose, Bld: 100 mg/dL — ABNORMAL HIGH (ref 70–99)
Potassium: 4.6 mmol/L (ref 3.5–5.1)
Sodium: 129 mmol/L — ABNORMAL LOW (ref 135–145)
Total Bilirubin: 0.9 mg/dL (ref 0.3–1.2)
Total Protein: 4 g/dL — ABNORMAL LOW (ref 6.5–8.1)

## 2019-06-18 LAB — T3, FREE: T3, Free: 1.5 pg/mL — ABNORMAL LOW (ref 2.0–4.4)

## 2019-06-18 MED ORDER — ASPIRIN 81 MG PO CHEW
81.0000 mg | CHEWABLE_TABLET | Freq: Every day | ORAL | Status: DC
  Administered 2019-06-18 – 2019-06-20 (×3): 81 mg via ORAL
  Filled 2019-06-18 (×3): qty 1

## 2019-06-18 MED ORDER — HEPARIN SODIUM (PORCINE) 1000 UNIT/ML IJ SOLN
INTRAMUSCULAR | Status: AC
  Administered 2019-06-18: 3200 [IU]
  Filled 2019-06-18: qty 4

## 2019-06-18 MED ORDER — STROKE: EARLY STAGES OF RECOVERY BOOK
Freq: Once | Status: DC
  Filled 2019-06-18: qty 1

## 2019-06-18 MED ORDER — CLOPIDOGREL BISULFATE 75 MG PO TABS
75.0000 mg | ORAL_TABLET | Freq: Every day | ORAL | Status: DC
  Administered 2019-06-18 – 2019-06-20 (×3): 75 mg via ORAL
  Filled 2019-06-18 (×3): qty 1

## 2019-06-18 NOTE — Progress Notes (Signed)
[facility] Progress Note   Subjective:   Seen in room - Sleeping but rouses easily. Calm this am, oriented. Asking when she's going to dialysis.    Objective Vitals:   06/17/19 2248 06/18/19 0434 06/18/19 0809 06/18/19 0857  BP: (!) 157/81 (!) 153/62 139/62   Pulse: 63  (!) 59   Resp:  14 19   Temp:  98.2 F (36.8 C) 98.6 F (37 C)   TempSrc:  Oral Oral   SpO2:  92% 99% 99%  Weight:  49.8 kg    Height:       Physical Exam General: Frail woman, nad  Heart: RRR; 4/6 murmur noted Lungs: CTA anteriorly Abdomen: soft, non-tender Extremities: no LE edema; ?ischemic changes to B 5th toes Dialysis Access: TDC + LUE AVF + thrill  Additional Objective Labs: Basic Metabolic Panel: Recent Labs  Lab 06/16/19 1257 06/17/19 1130 06/18/19 0410  NA 135 136 129*  K 3.8 4.2 4.6  CL 99 100 95*  CO2 27 23 25   GLUCOSE 104* 140* 100*  BUN 24* 17 21  CREATININE 2.87* 2.74* 3.59*  CALCIUM 7.2* 7.6* 7.6*  PHOS 4.5 4.0  --    Liver Function Tests: Recent Labs  Lab 06/14/19 1010 06/14/19 1010 06/16/19 1257 06/17/19 1130 06/18/19 0410  AST 16  --   --   --  21  ALT 13  --   --   --  12  ALKPHOS 50  --   --   --  49  BILITOT 1.0  --   --   --  0.9  PROT 4.4*  --   --   --  4.0*  ALBUMIN 2.1*   < > 1.6* 2.1* 2.1*   < > = values in this interval not displayed.   Recent Labs  Lab 06/14/19 1010  LIPASE 42   CBC: Recent Labs  Lab 06/14/19 1010 06/14/19 1852 06/15/19 0744 06/15/19 0744 06/15/19 1616 06/16/19 0644 06/17/19 1130  WBC 7.7  --  11.3*   < > 12.7* 11.4* 6.5  NEUTROABS 6.5  --   --   --   --   --   --   HGB 7.1*   < > 8.5*   < > 8.2* 9.0* 8.2*  HCT 23.9*   < > 25.4*   < > 25.5* 27.1* 26.7*  MCV 91.2  --  86.7  --  88.5 86.3 91.8  PLT 202  --  126*   < > 128* 136* 141*   < > = values in this interval not displayed.   Medications:  . allopurinol  100 mg Oral Daily  . amiodarone  200 mg Oral BID  . Chlorhexidine Gluconate Cloth  6 each  Topical Daily  . darbepoetin (ARANESP) injection - DIALYSIS  200 mcg Intravenous Q Thu-HD  . dicyclomine  10 mg Oral TID AC & HS  . ferric citrate  630 mg Oral TID WC  . gabapentin  300 mg Oral QHS  . isosorbide mononitrate  30 mg Oral QHS  . metoprolol tartrate  100 mg Oral BID  . mometasone-formoterol  2 puff Inhalation BID  . pantoprazole (PROTONIX) IV  40 mg Intravenous Q12H  . pravastatin  80 mg Oral QPM  . sucralfate  1 g Oral Q6H    Dialysis Orders: TTS at [place], 350/A1.5, EDW 40.5kg, 2K/2Ca, UFP #2, AVF + TDC, no heparin - Mircera 262mcg IV q 2 weeks (last 5/6) - Venofer 100  x 10 ordered - Calcitriol 23mcg PO q HD  Assessment/Plan: 1. UGIB: Melena/hematemesis on admit -> given 3U PRBCs. EGD 5/20 with duodenal ulcers noted. 2. A-fib NIO:[id number], started on amiodarone by cardiology. No AC d/t GIB.  3. Hyperkalemia: S/p HD 5/19 to correct. 4. ESRD:Usual TTS schedule - had missed 1 week HD. HD 5/19, then again 5/20 per usual schedule. HD today on schedule.  5. Hypertension/volume:BP stable - no edema on exam but up per weights - UF as tolerated. 6. Anemia(ABLA + ESRD):See #1. Aranesp 288mcg given 8/18. 7. Metabolic bone disease:CorrCa ok - resume binders once cleared to eat. 8. Hx mesenteric ischemia: Hx SMA stent 3/31 9. PAD: ?ischemic L 4th/5th toes. 10. AMS: New onset 5/20 - unclear trigger. Blood cultures ordered -ngtd. MRI Brain -chronic ischemic disease w  tiny acute cortical infarct in the left occipital - per primary. Appears at baseline today.   Christina Child PA-C [facility] Pager [phone] 06/18/2019,10:09 AM

## 2019-06-18 NOTE — Consult Note (Signed)
Neurology Consultation  Reason for Consult: Stroke, encephalopathy Referring Physician: Dr. Jacki Cones of [facility]  CC: Hematemesis-gathered from the H&P.  Reason for consult-confusion and stroke on MRI on further work-up  History is obtained from: Patient and chart review  HPI: Christina [name] is a 69 y.o. female who has a past medical history that is significant for ESRD on dialysis TTS, anxiety, atrial fibrillation not on anticoagulation, GI bleeding, headaches, hypertension, hyperlipidemia, coronary artery disease, currently on dual antiplatelets, COPD, depression, hypertension, hyperlipidemia, cocaine abuse, admitted on 06/14/2019 with hematemesis and abdominal pain.  She has been admitted multiple times for hematemesis and treated with PPIs and sucralfate.  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  She also had A. fib with RVR at that time.  She was hospitalized again with respiratory failure from volume overload versus COPD exacerbation in the middle of April.  She was hospitalized at [place] for 4 25-4 27 with A. fib RVR-not a coagulation due to large duodenal ulcer and discharged on metoprolol and Cardizem. She was admitted earlier in May [facility] with peripheral artery disease and dry gangrene of the left foot digit-plan was to do outpatient vascular follow-up. She started having hematemesis and large bloody bowel movements on the morning of presentation, with significant abdominal pain-also associated left lower extremity pain.  Also had rectal prolapse while having one of the bloody bowel movements. Hemoglobin dropped from 10-->-7.1. Neurological consultation was obtained because she had been confused and a phone consultation earlier was done, MRI was recommended and the MRI showed a small embolic-looking infarct in the left hemisphere.   Patient is the nurses and the care team MDs have noticed off-and-on confusion and are concerned about a cerebral etiology. Patient denies any history of strokes in the past.  Denies headaches visual changes.  Denies any focal tingling numbness or weakness.  Denies any cognitive difficulties. Reports burning sensation in the feet for the past 4 years without any relief. Denies chest pain or shortness of breath.  Denies fevers chills.  Denies preceding illness or sickness.  LKW: Prior to presentation that was about 4 days ago tpa given?: no, outside window, active GI bleed Premorbid modified Rankin scale (mRS): 2   ROS: Performed and negative except as noted in HPI.  Past Medical History:  Diagnosis Date  . Anemia   . Anxiety   . Arthritis   . Asthma   . Atrial fibrillation ([place])   . Chronic kidney disease    Dialysis T/Th/Sa  started in March 2020  . COPD (chronic obstructive pulmonary disease) ([place])   . Coronary artery disease    2 stents  . Depression   . GERD (gastroesophageal reflux disease)   . GI bleeding 06/15/2019  . Gout   . Headache    migraines  . History of kidney stones   . Hyperlipidemia   . Hypertension   . Pneumonia   . Small bowel obstruction ([facility])     Family History  Problem Relation Age of Onset  . Ulcers Sister        sick a long time, improved after surgery    Social History:   reports that she quit smoking about 2 years ago. She has a 25.00 pack-year smoking history. She has never used smokeless tobacco. She reports previous alcohol use. She reports previous drug use. Drug: Marijuana.  Medications  Current Facility-Administered  Medications:  .  acetaminophen (TYLENOL) tablet 650 mg, 650 mg, Oral, Q6H PRN **OR** acetaminophen (TYLENOL) suppository 650 mg, 650 mg, Rectal, Q6H PRN, [name], MD .  albuterol (PROVENTIL) (2.5 MG/3ML) 0.083% nebulizer solution 2.5 mg, 2.5 mg, Nebulization, Q6H PRN, [name], MD .  allopurinol  (ZYLOPRIM) tablet 100 mg, 100 mg, Oral, Daily, [name], MD, 100 mg at 06/18/19 9242 .  amiodarone (PACERONE) tablet 200 mg, 200 mg, Oral, BID, Belva Crome, MD, 200 mg at 06/18/19 6834 .  calcium carbonate (dosed in mg elemental calcium) suspension 500 mg of elemental calcium, 500 mg of elemental calcium, Oral, Q6H PRN, [name], MD .  camphor-menthol [facility]) lotion 1 application, 1 application, Topical, H9Q PRN **AND** hydrOXYzine (ATARAX/VISTARIL) tablet 25 mg, 25 mg, Oral, Q8H PRN, [name], MD, 25 mg at 06/16/19 0146 .  Chlorhexidine Gluconate Cloth 2 % PADS 6 each, 6 each, Topical, Daily, [name], MD, 6 each at 06/17/19 0950 .  Darbepoetin Alfa (ARANESP) injection 200 mcg, 200 mcg, Intravenous, Q Thu-HD, Loren Racer, PA-C, 200 mcg at 06/16/19 1440 .  dicyclomine (BENTYL) capsule 10 mg, 10 mg, Oral, TID AC & HS, [name], MD, 10 mg at 06/18/19 2229 .  diphenhydrAMINE (BENADRYL) capsule 25 mg, 25 mg, Oral, QHS PRN, [name], MD, 25 mg at 06/16/19 2353 .  docusate sodium (ENEMEEZ) enema 283 mg, 1 enema, Rectal, PRN, [name], MD .  feeding supplement (NEPRO CARB STEADY) liquid 237 mL, 237 mL, Oral, TID PRN, [name], MD .  fentaNYL (SUBLIMAZE) injection 25-50 mcg, 25-50 mcg, Intravenous, Q2H PRN, [name], MD, 50 mcg at 06/15/19 2015 .  ferric citrate (AURYXIA) tablet 630 mg, 630 mg, Oral, TID WC, [name], MD, 630 mg at 06/18/19 7989 .  gabapentin (NEURONTIN) capsule 300 mg, 300 mg, Oral, QHS, [name], MD, 300 mg at 06/17/19 2245 .  haloperidol lactate (HALDOL) injection 1 mg, 1 mg, Intravenous, Q6H PRN, Georgette Shell, MD .  ipratropium-albuterol (DUONEB) 0.5-2.5 (3) MG/3ML nebulizer solution 3 mL, 3 mL, Inhalation, QID PRN, [name], MD .  isosorbide mononitrate (IMDUR) 24 hr tablet 30 mg, 30 mg, Oral, QHS, [name], MD, 30 mg at 06/17/19 2245 .  metoprolol tartrate  (LOPRESSOR) tablet 100 mg, 100 mg, Oral, BID, [name], MD, 100 mg at 06/17/19 2248 .  mometasone-formoterol (DULERA) 200-5 MCG/ACT inhaler 2 puff, 2 puff, Inhalation, BID, [name], MD, 2 puff at 06/18/19 0857 .  ondansetron (ZOFRAN) tablet 4 mg, 4 mg, Oral, Q6H PRN **OR** ondansetron (ZOFRAN) injection 4 mg, 4 mg, Intravenous, Q6H PRN, [name], MD, 4 mg at 06/18/19 0020 .  pantoprazole (PROTONIX) injection 40 mg, 40 mg, Intravenous, Q12H, [name], MD, 40 mg at 06/18/19 1058 .  pravastatin (PRAVACHOL) tablet 80 mg, 80 mg, Oral, QPM, [name], MD, 80 mg at 06/17/19 1654 .  sorbitol 70 % solution 30 mL, 30 mL, Oral, PRN, [name], MD .  sucralfate (CARAFATE) tablet 1 g, 1 g, Oral, Q6H, [name], MD, 1 g at 06/18/19 0453   Exam: Current vital signs: BP 137/76 (BP Location: Left Arm)   Pulse (!) 58   Temp 97.7 F (36.5 C) (Oral)   Resp 11   Ht 5\' 2"  (1.575 m)   Wt 49.8 kg   SpO2 95%   BMI 20.08 kg/m  Vital  signs in last 24 hours: Temp:  [97.4 F (36.3 C)-98.6 F (37 C)] 97.7 F (36.5 C) (05/22 1035) Pulse Rate:  [56-63] 58 (05/22 1035) Resp:  [11-19] 11 (05/22 1035) BP: (137-157)/(54-81) 137/76 (05/22 1035) SpO2:  [91 %-99 %] 95 % (05/22 1035) FiO2 (%):  [21 %] 21 % (05/22 0857) Weight:  [49.8 kg] 49.8 kg (05/22 0434) General: Awake alert in no distress HEENT: Normocephalic atraumatic, poor dentition Lungs: Scattered wheezing Cardiovascular: Regular rate rhythm Abdomen nondistended but tender Extremities: Both lower extremities have petechiae and are extremely tender to touch Neurological exam Awake alert oriented x3 Seems a little frustrated with the situation that she is in right now No aphasia No dysarthria Voice is a little hoarse Cranial nerves: Pupils equal round react light, extraocular movements intact, visual fields full, facial sensation intact, auditory acuity intact, face symmetric, tongue and palate  midline. Motor exam: Antigravity-5/5-in all 4 extremities Sensory exam: Extremely tender to touch feet which she did not let me examine properly but elsewhere intact sensation and no extinction Coordination: No obvious dysmetria NIH stroke scale-0  Labs I have reviewed labs in epic and the results pertinent to this consultation are:  CBC    Component Value Date/Time   WBC 6.5 06/17/2019 1130   RBC 2.91 (L) 06/17/2019 1130   HGB 8.2 (L) 06/17/2019 1130   HCT 26.7 (L) 06/17/2019 1130   PLT 141 (L) 06/17/2019 1130   MCV 91.8 06/17/2019 1130   MCH 28.2 06/17/2019 1130   MCHC 30.7 06/17/2019 1130   RDW 18.5 (H) 06/17/2019 1130   LYMPHSABS 0.5 (L) 06/14/2019 1010   MONOABS 0.5 06/14/2019 1010   EOSABS 0.0 06/14/2019 1010   BASOSABS 0.0 06/14/2019 1010    CMP     Component Value Date/Time   NA 129 (L) 06/18/2019 0410   K 4.6 06/18/2019 0410   CL 95 (L) 06/18/2019 0410   CO2 25 06/18/2019 0410   GLUCOSE 100 (H) 06/18/2019 0410   BUN 21 06/18/2019 0410   CREATININE 3.59 (H) 06/18/2019 0410   CALCIUM 7.6 (L) 06/18/2019 0410   PROT 4.0 (L) 06/18/2019 0410   ALBUMIN 2.1 (L) 06/18/2019 0410   AST 21 06/18/2019 0410   ALT 12 06/18/2019 0410   ALKPHOS 49 06/18/2019 0410   BILITOT 0.9 06/18/2019 0410   GFRNONAA 12 (L) 06/18/2019 0410   GFRAA 14 (L) 06/18/2019 0410   Lipid Panel     Component Value Date/Time   CHOL 177 04/01/2008 0827   TRIG 89 02/07/2019 0412   HDL 48 04/01/2008 0827   CHOLHDL 3.7 04/01/2008 0827   VLDL 36 04/01/2008 0827   LDLCALC  04/01/2008 0827    93        Total Cholesterol/HDL:CHD Risk Coronary Heart Disease Risk Table                     Men   Women  1/2 Average Risk   3.4   3.3  Average Risk       5.0   4.4  2 X Average Risk   9.6   7.1  3 X Average Risk  23.4   11.0        Use the calculated Patient Ratio above and the CHD Risk Table to determine the patient's CHD Risk.        ATP III CLASSIFICATION (LDL):  <100     mg/dL   Optimal   100-129  mg/dL   Near or Above  Optimal  130-159  mg/dL   Borderline  160-189  mg/dL   High  >190     mg/dL   Very High     Imaging I have reviewed the images obtained: MRI examination of the brain shows a small punctate acute cortical infarct in the left occipital lobe adjacent to chronic infarct.   Assessment: 69 year old with above past medical history admitted for evaluation of an acute GI bleed, with off-and-on confusion and and imaging in the form of MRI that was done that shows a acute cortical infarct as above. Given her history of atrial fibrillation not on anticoagulation and acute blood loss-this is either embolic versus hypoperfusion versus watershed in etiology. Has a history of ESRD, A. fib, hypertension, hyperlipidemia as well as cocaine abuse along with peripheral vascular disease. At this time, it is prudent to complete her work-up from a stroke perspective so that her risk factors can be optimized  Impression: Acute ischemic stroke-etiology under investigation Atrial fibrillation ESRD Hypertension Hyperlipidemia  Recommendations: Continue telemetry Frequent neurochecks I would check the head and neck vasculature with a CTA of the head and neck-she is a dialysis patient and should be able to get contrast and subsequently get dialyzed. A1c Lipid panel 2D echo PT OT speech therapy N.p.o. until cleared by swallow and speech evaluation No need for permissive hypertension as she is already probably multiple days out of her last normal. As far as preventative therapy is concerned-she is currently on dual antiplatelets-but given the history of A. fib and a cortical stroke, she will need to be on anticoagulation.  In the setting of current GI bleed, I will defer that decision to the primary service and GI service-whenever okay with them, should start anticoagulation with Eliquis. Stroke team will follow up the rest of the work-up with you Dr. Zigmund Daniel  was notified of the plan via secure chat -- Amie Portland, MD [facility] Pager: [phone] If 7pm to 7am, please call on call as listed on AMION.

## 2019-06-18 NOTE — Progress Notes (Signed)
PROGRESS NOTE    NAKOTA ACKERT  RTM:[id number] DOB: [birth year] DOA: 06/14/2019 PCP: Maggie Schwalbe, PA-C    Brief Narrative: 69 y.o.femalewith medical history significant ofSBO; HTN; HLD; depression; CAD with stents; COPD; ESRD on TTS HD; and afib presenting withhematemesis. I admitted her for abdominal pain on 1/25; this was thought to be related to missed HD and she was discharged on 1/27. She returned for abdominal pain from 1/31-2/2, again after missing HD. She had a subsequent ER visit and then hospitalization and presented again from 2/26-3/3 with abdominal pain and ABLA in the setting of cocaine use. EGD was performed and showed esophagitis, gastritis, and multiple duodenal ulcers. She was started on PPI and Carafate. She returned again on 3/8 with concern for hematemesis (hemoccult negative). 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 She also had afib with RVR. She was hospitalized again from 4/13-15 with acute respiratory failure from volume overload vs. COPD exacerbation. She was hospitalized at [facility] from 4/25-27 with afib with RVR; no AC due to large duodenal ulcer, discharged on metoprolol and Cardizem. Finally, she was hospitalized at [facility] from 5/8-9 with PAD and dry gangrene of a left foot digit; after discussion, the plan was for d/c with outpatient vascular follow up.  She reports hematemesis and large bloody BM at home this AM. She is having significant abdominal pain as well as LLE pain associated with dry gangrene. Upon arriving to the floor, she had a large bloody BM and was noted to have rectal prolapse. She reports that this happens frequently at home.   ED Course:Woke up this AM with coffee ground emesis and then had a bloody BM. Hgb 10 ->7.1. Abdominal pain out of proportion to exam -  thickening of bowel wall. Transfusing. GI to follow. Now in afib with RVR.  06/15/2019 staff reported patient had a bloody bowel movement today. She also went into A. fib RVR and converted to sinus tach. She continued to complain of abdominal pain and cramps. 06/16/2019 patient had small bowel movements overnight with maroon-colored stool.  Assessment & Plan:   Principal Problem:   Acute GI bleeding Active Problems:   ESRD (end stage renal disease) (HCC)   CAD (coronary artery disease), native coronary artery   COPD with chronic bronchitis (HCC)   Essential hypertension   Hyperlipidemia   Cocaine abuse ([place])   Superior mesenteric artery atherosclerosis (HCC)   Atrial fibrillation with rapid ventricular response ([place])   Incomplete rectal prolapse   #1 GI bleed- patient has had multiple hospital admissions since the beginning of this year for the same and abdominal pain.Hemoglobin 8.2 today down from 9 yesterday  Status post EGD 06/16/2019 with multiple deep duodenal ulcers noted. These ulcers appeared very similar to the EGD findings from February 2021. Biopsy not done this time as the biopsies done in February showed no signs of cancer or infection.   Will change Protonix to p.o. twice a day.  Continue Protonix indefinitely. Received a dose of Feraheme 06/16/2019 Will need iron supplementation on discharge.  She is status post a high-grade SMA stenosis and stent placement. Vascular surgery was consulted for concern for ischemic bowel-status post SMA stent CT angiogram consistent with patent stent.He recommended to continue follow-up with Dr. Carlis Abbott for a mesenteric duplex.  #2 A. fib RVR not on anticoagulation due to GI bleeding history.Appreciate cardiology input. Patient started on  amiodarone and restarted Lopressor. Heart rate high 50s.  Decrease the dose of Lopressor.  #3 ESRD on dialysis Tuesday Thursday and Saturdays with a left upper arm fistula with a right IJ  dialysis catheter in place  #4 peripheral vascular disease follow-up with vascular surgery as an outpatient.  Restart aspirin and Plavix now that the GI bleeding has resolved.   #5 chronic rectal prolapse patient does not want surgery done  #6 history of essential hypertension-blood pressure 137/76   #7 history of COPD stable  #8 history of hyperlipidemia continue statin  #9 history of cocaine abuse  #10 hyperkalemia patient on dialysis potassium 4.2  #11 stroke -MRI tiny acute cortical infarct in the left occipital lobe located near a chronic left PCA infarct.advanced chronic ischemic disease in the bilateral cerebellum and bilateral territories Neurology consulted.   Estimated body mass index is 20.08 kg/m as calculated from the following:   Height as of this encounter: 5\' 2"  (1.575 m).   Weight as of this encounter: 49.8 kg.  DVT prophylaxis:scd Code Status:dnr Family Communication:dw patient attempted to reach daughter with no response. Disposition Plan:Status is: Inpatient  Dispo: The patient is from:home Anticipated d/c is JY:NWGNFAO nursing facility anticipated d/c date ZH:YQMVHQI Patient currentlyis not medically stable to d/c.   Patient with new stroke neurology consult pending.   Consultants:GI, vascular surgery, cardiology nephrology, neurology  Procedures: None Antimicrobialsnone  Subjective:  Patient resting in bed was able to wake her up much more awake compared to yesterday. Objective: Vitals:   06/18/19 0434 06/18/19 0809 06/18/19 0857 06/18/19 1035  BP: (!) 153/62 139/62  137/76  Pulse:  (!) 59  (!) 58  Resp: 14 19  11   Temp: 98.2 F (36.8 C) 98.6 F (37 C)  97.7 F (36.5 C)  TempSrc: Oral Oral  Oral  SpO2: 92% 99% 99% 95%  Weight: 49.8 kg     Height:        Intake/Output Summary (Last 24 hours) at 06/18/2019 1209 Last data filed at 06/18/2019 1100 Gross per 24 hour  Intake 480 ml    Output --  Net 480 ml   Filed Weights   06/16/19 1310 06/16/19 1547 06/18/19 0434  Weight: 50.6 kg 48.3 kg 49.8 kg    Examination:  General exam: Appears calm and comfortable  Respiratory system: Clear to auscultation. Respiratory effort normal. Cardiovascular system: S1 & S2 heard, RRR. No JVD, murmurs, rubs, gallops or clicks. No pedal edema. Gastrointestinal system: Abdomen is nondistended, soft and nontender. No organomegaly or masses felt. Normal bowel sounds heard. Central nervous system: Less confused moves all extremities extremities: Symmetric 5 x 5 power. Skin: No rashes, lesions or ulcers Psychiatry: Judgement and insight appear normal. Mood & affect appropriate.     Data Reviewed: I have personally reviewed following labs and imaging studies  CBC: Recent Labs  Lab 06/14/19 1010 06/14/19 1010 06/14/19 1852 06/15/19 0744 06/15/19 1616 06/16/19 0644 06/17/19 1130  WBC 7.7  --   --  11.3* 12.7* 11.4* 6.5  NEUTROABS 6.5  --   --   --   --   --   --   HGB 7.1*   < > 6.0* 8.5* 8.2* 9.0* 8.2*  HCT 23.9*   < > 19.5* 25.4* 25.5* 27.1* 26.7*  MCV 91.2  --   --  86.7 88.5 86.3 91.8  PLT 202  --   --  126* 128* 136* 141*   < > = values in this interval not displayed.  Basic Metabolic Panel: Recent Labs  Lab 06/14/19 1010 06/15/19 0744 06/16/19 1257 06/17/19 1130 06/18/19 0410  NA 137 137 135 136 129*  K 5.5* 5.6* 3.8 4.2 4.6  CL 101 109 99 100 95*  CO2 18* 16* 27 23 25   GLUCOSE 147* 81 104* 140* 100*  BUN 80* 103* 24* 17 21  CREATININE 6.71* 7.01* 2.87* 2.74* 3.59*  CALCIUM 7.7* 7.3* 7.2* 7.6* 7.6*  PHOS  --   --  4.5 4.0  --    GFR: Estimated Creatinine Clearance: 11.6 mL/min (A) (by C-G formula based on SCr of 3.59 mg/dL (H)). Liver Function Tests: Recent Labs  Lab 06/14/19 1010 06/16/19 1257 06/17/19 1130 06/18/19 0410  AST 16  --   --  21  ALT 13  --   --  12  ALKPHOS 50  --   --  49  BILITOT 1.0  --   --  0.9  PROT 4.4*  --   --  4.0*   ALBUMIN 2.1* 1.6* 2.1* 2.1*   Recent Labs  Lab 06/14/19 1010  LIPASE 42   Recent Labs  Lab 06/17/19 1450  AMMONIA 25   Coagulation Profile: Recent Labs  Lab 06/14/19 1010  INR 1.2   Cardiac Enzymes: No results for input(s): CKTOTAL, CKMB, CKMBINDEX, TROPONINI in the last 168 hours. BNP (last 3 results) No results for input(s): PROBNP in the last 8760 hours. HbA1C: No results for input(s): HGBA1C in the last 72 hours. CBG: No results for input(s): GLUCAP in the last 168 hours. Lipid Profile: No results for input(s): CHOL, HDL, LDLCALC, TRIG, CHOLHDL, LDLDIRECT in the last 72 hours. Thyroid Function Tests: Recent Labs    06/17/19 1130 06/17/19 1450  TSH 9.456*  --   FREET4  --  1.09  T3FREE  --  1.5*   Anemia Panel: No results for input(s): VITAMINB12, FOLATE, FERRITIN, TIBC, IRON, RETICCTPCT in the last 72 hours. Sepsis Labs: Recent Labs  Lab 06/14/19 1610 06/14/19 1852 06/15/19 0744  LATICACIDVEN 1.8 3.8* 0.8    Recent Results (from the past 240 hour(s))  SARS Coronavirus 2 by RT PCR (hospital order, performed in [facility] hospital lab) Nasopharyngeal Nasopharyngeal Swab     Status: None   Collection Time: 06/14/19  3:36 PM   Specimen: Nasopharyngeal Swab  Result Value Ref Range Status   SARS Coronavirus 2 NEGATIVE NEGATIVE Final    Comment: (NOTE) SARS-CoV-2 target nucleic acids are NOT DETECTED. The SARS-CoV-2 RNA is generally detectable in upper and lower respiratory specimens during the acute phase of infection. The lowest concentration of SARS-CoV-2 viral copies this assay can detect is 250 copies / mL. A negative result does not preclude SARS-CoV-2 infection and should not be used as the sole basis for treatment or other patient management decisions.  A negative result may occur with improper specimen collection / handling, submission of specimen other than nasopharyngeal swab, presence of viral mutation(s) within the areas targeted by this  assay, and inadequate number of viral copies (<250 copies / mL). A negative result must be combined with clinical observations, patient history, and epidemiological information. Fact Sheet for Patients:   StrictlyIdeas.no Fact Sheet for Healthcare Providers: BankingDealers.co.za This test is not yet approved or cleared  by the [place] FDA and has been authorized for detection and/or diagnosis of SARS-CoV-2 by FDA under an Emergency Use Authorization (EUA).  This EUA will remain in effect (meaning this test can be used) for the duration of the COVID-19 declaration under  Section 564(b)(1) of the Act, 21 U.S.C. section 360bbb-3(b)(1), unless the authorization is terminated or revoked sooner. Performed at [facility], [place] [address], [place], [place] [address]   Culture, blood (Routine X 2) w Reflex to ID Panel     Status: None (Preliminary result)   Collection Time: 06/17/19 12:18 PM   Specimen: BLOOD RIGHT ARM  Result Value Ref Range Status   Specimen Description BLOOD RIGHT ARM  Final   Special Requests   Final    BOTTLES DRAWN AEROBIC AND ANAEROBIC Blood Culture results may not be optimal due to an inadequate volume of blood received in culture bottles   Culture   Final    NO GROWTH < 24 HOURS Performed at [facility], [place] [address], [place], [place] [address]    Report Status PENDING  Incomplete  Culture, blood (Routine X 2) w Reflex to ID Panel     Status: None (Preliminary result)   Collection Time: 06/17/19 12:23 PM   Specimen: BLOOD RIGHT HAND  Result Value Ref Range Status   Specimen Description BLOOD RIGHT HAND  Final   Special Requests   Final    BOTTLES DRAWN AEROBIC ONLY Blood Culture results may not be optimal due to an inadequate volume of blood received in culture bottles   Culture   Final    NO GROWTH < 24 HOURS Performed at [facility], [place] [address] [place], [place] [address]    Report  Status PENDING  Incomplete         Radiology Studies: DG Chest 1 View  Result Date: 06/17/2019 CLINICAL DATA:  Hematemesis, history small bowel obstruction, hypertension, coronary artery disease post stenting, COPD, end-stage renal disease on hemodialysis, anemia secondary to GI bleed EXAM: CHEST  1 VIEW COMPARISON:  Portable exam 1506 hours compared to 06/14/2019 FINDINGS: RIGHT jugulars dialysis catheter with tip projecting over RIGHT atrium. Enlargement of cardiac silhouette with slight pulmonary vascular congestion. Atherosclerotic calcification aorta. Minimal perihilar to basilar pulmonary infiltrates consistent with pulmonary edema. No pleural effusion or pneumothorax. Bones demineralized. IMPRESSION: Enlargement of cardiac silhouette with pulmonary vascular congestion and minimal pulmonary edema. Electronically Signed   By: Lavonia Dana M.D.   On: 06/17/2019 15:26   CT HEAD WO CONTRAST  Result Date: 06/17/2019 CLINICAL DATA:  Delirium/confusion after dialysis EXAM: CT HEAD WITHOUT CONTRAST TECHNIQUE: Contiguous axial images were obtained from the base of the skull through the vertex without intravenous contrast. COMPARISON:  July 17, 2018 maxillofacial CT covering much of the brain parenchyma FINDINGS: Brain: There is mild diffuse atrophy. There is no intracranial mass, hemorrhage, extra-axial fluid collection, or midline shift. There is widespread small vessel disease throughout the centra semiovale bilaterally. There is a prior appearing infarct in the superior mid left frontal lobe with involvement of both gray and white matter in this area. There is a prior appearing infarct in the posterosuperior left parietal lobe at the gray-white junction. There is evidence of a prior infarct in the medial right parietooccipital junction region. There is a prior infarct in the head of the caudate nucleus on the right. There is an age uncertain infarct in the left thalamus which has somewhat indistinct  borders suggesting that this infarct may be recent/acute. There are small lacunar type infarcts in each cerebellar hemisphere, more on the right than on the left. Vascular: No hyperdense vessel. There are foci of calcification in each carotid siphon and distal vertebral artery region. Skull: The bony calvarium a  appears intact. Sinuses/Orbits: Paranasal sinuses are clear. There is rightward deviation of the nasal septum. Orbits appear symmetric bilaterally. Other: Mastoid air cells are clear. There is debris in each external auditory canal. Brain: It IMPRESSION: Mild atrophy. Extensive supratentorial small vessel disease. 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 And head of caudate nucleus on the right. Suspect more recent and potentially acute focal infarct in the left thalamus. Lacunar type infarcts noted in the cerebellar hemispheres bilaterally, more on the right than on the left. No mass or hemorrhage. Foci of arterial vascular calcification noted at multiple levels. Probable cerumen in each external auditory canal. Deviated nasal septum. Electronically Signed   By: Lowella Grip III M.D.   On: 06/17/2019 16:19   MR BRAIN WO CONTRAST  Result Date: 06/17/2019 CLINICAL DATA:  69 year old female with encephalopathy. Confusion after dialysis. EXAM: MRI HEAD WITHOUT CONTRAST TECHNIQUE: Multiplanar, multiecho pulse sequences of the brain and surrounding structures were obtained without intravenous contrast. COMPARISON:  Head CT without contrast earlier today. FINDINGS: The examination had to be discontinued prior to completion by patient request. Diffusion-weighted imaging, axial T2 imaging, and moderately degraded sagittal T1 imaging only was obtained. Major intracranial vascular flow voids are preserved. There is a tiny focus of restricted diffusion in the cortex of the left lateral occipital  lobe. No other convincing restricted diffusion. See series 5, image 69. Nearby chronic cortical encephalomalacia in the left occipital pole. There are scattered small chronic infarcts in both cerebellar hemispheres, in the bilateral basal ganglia. There are chronic cortical infarcts in the bilateral MCA territories, and right posterior MCA/PCA watershed with cortical encephalomalacia. Superimposed confluent bilateral cerebral white matter T2 hyperintensity. No midline shift, mass effect, evidence of mass lesion, ventriculomegaly, extra-axial collection or acute intracranial hemorrhage is evident. Paranasal Visualized paranasal sinuses and mastoids are stable and well pneumatized. Visible internal auditory structures appear normal. Mildly asymmetric size of the right superior ophthalmic vein, but otherwise negative orbits. Unremarkable noncontrast visible cavernous sinus. IMPRESSION: 1. Truncated exam by patient request. 2. Positive for a tiny acute cortical infarct in the left occipital lobe, located near a chronic left PCA infarct. 3. Underlying advanced chronic ischemic disease in the bilateral cerebellum and bilateral MCA territories. Electronically Signed   By: Genevie Ann M.D.   On: 06/17/2019 19:10        Scheduled Meds: . allopurinol  100 mg Oral Daily  . amiodarone  200 mg Oral BID  . Chlorhexidine Gluconate Cloth  6 each Topical Daily  . darbepoetin (ARANESP) injection - DIALYSIS  200 mcg Intravenous Q Thu-HD  . dicyclomine  10 mg Oral TID AC & HS  . ferric citrate  630 mg Oral TID WC  . gabapentin  300 mg Oral QHS  . isosorbide mononitrate  30 mg Oral QHS  . metoprolol tartrate  100 mg Oral BID  . mometasone-formoterol  2 puff Inhalation BID  . pantoprazole (PROTONIX) IV  40 mg Intravenous Q12H  . pravastatin  80 mg Oral QPM  . sucralfate  1 g Oral Q6H   Continuous Infusions:   LOS: 4 days     Georgette Shell, MD 06/18/2019, 12:09 PM

## 2019-06-18 NOTE — NC FL2 (Signed)
[place] LEVEL OF CARE SCREENING TOOL     IDENTIFICATION  Patient Name: Christina [name] Birthdate: November 06, 1950 Sex: female Admission Date (Current Location): 06/14/2019  [facility] and [place] Number:  Herbalist and Address:         Provider Number: [id number]  Attending Physician Name and Address:  Georgette Shell, MD  Relative Name and Phone Number:       Current Level of Care: Hospital Recommended Level of Care: [place] Prior Approval Number:    Date Approved/Denied:   PASRR Number: [id number]  Discharge Plan: SNF    Current Diagnoses: Patient Active Problem List   Diagnosis Date Noted  . Atrial fibrillation with rapid ventricular response ([place]) 06/14/2019  . Incomplete rectal prolapse 06/14/2019  . Respiratory failure with hypoxia ([place]) 05/10/2019  . Pneumonia 05/10/2019  . Superior mesenteric artery atherosclerosis ([place])   . Duodenal ulcer perforation ([place]) 04/23/2019  . Cocaine abuse ([place]) 04/23/2019  . Acute gastric ulcer without hemorrhage or perforation   . Hematemesis 04/04/2019  . Atrial fibrillation with RVR ([place])   . Duodenal ulcer   . Gastritis and gastroduodenitis   . Acute blood loss anemia   . 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 RUQ pain   . Anemia due to chronic kidney disease, on chronic dialysis ([place])   . AF (paroxysmal atrial fibrillation) ([place])   . Chest pain 03/19/2019  . Hyperkalemia 02/28/2019  . Uremia 02/27/2019  . Volume overload 02/21/2019  . Abdominal pain 02/21/2019  . Non-compliance with renal dialysis ([place]) 02/21/2019  . Respiratory failure ([place]) 02/04/2019  . Acute and chr resp failure, unsp w hypoxia or hypercapnia ([place])   . Afib ([place]) 01/25/2019  . SBO (small bowel obstruction) ([place]) 08/10/2018  . Anemia due to chronic blood loss   . Heme positive stool   . Platelet inhibition due to Plavix   . Acute GI bleeding 05/06/2018  . ESRD (end stage renal disease) ([place])  05/06/2018  . Anxiety 05/06/2018  . Bipolar affective ([place]) 05/06/2018  . CAD (coronary artery disease), native coronary artery 05/06/2018  . COPD with chronic bronchitis ([place]) 05/06/2018  . CVA (cerebral vascular accident) ([place]) 05/06/2018  . Essential hypertension 05/06/2018  . Hyperlipidemia 05/06/2018    Orientation RESPIRATION BLADDER Height & Weight     Self, Time, Situation, Place(forgetful)  Normal Incontinent Weight: 109 lb 12.6 oz (49.8 kg) Height:  5\' 2"  (157.5 cm)  BEHAVIORAL SYMPTOMS/MOOD NEUROLOGICAL BOWEL NUTRITION STATUS      Incontinent    AMBULATORY STATUS COMMUNICATION OF NEEDS Skin   Extensive Assist Verbally Normal                       Personal Care Assistance Level of Assistance  Bathing, Dressing, Feeding Bathing Assistance: Limited assistance Feeding assistance: Limited assistance Dressing Assistance: Limited assistance     Functional Limitations Info             [place]  PT (By licensed PT), OT (By licensed OT)                    Contractures Contractures Info: Not present    Additional Factors Info  Code Status Code Status Info: DNR             Current Medications (06/18/2019):  This is the current hospital active medication list Current Facility-Administered Medications  Medication Dose Route Frequency Provider Last Rate Last Admin  .  acetaminophen (TYLENOL) tablet 650 mg  650 mg Oral Q6H PRN [name], MD       Or  . acetaminophen (TYLENOL) suppository 650 mg  650 mg Rectal Q6H PRN [name], MD      . albuterol (PROVENTIL) (2.5 MG/3ML) 0.083% nebulizer solution 2.5 mg  2.5 mg Nebulization Q6H PRN [name], MD      . allopurinol (ZYLOPRIM) tablet 100 mg  100 mg Oral Daily [name], MD   100 mg at 06/18/19 1660  . amiodarone (PACERONE) tablet 200 mg  200 mg Oral BID Belva Crome, MD   200 mg at 06/18/19 6301  . calcium carbonate (dosed in mg elemental calcium)  suspension 500 mg of elemental calcium  500 mg of elemental calcium Oral Q6H PRN [name], MD      . camphor-menthol [facility]) lotion 1 application  1 application Topical S0F PRN [name], MD       And  . hydrOXYzine (ATARAX/VISTARIL) tablet 25 mg  25 mg Oral Q8H PRN [name], MD   25 mg at 06/16/19 0146  . Chlorhexidine Gluconate Cloth 2 % PADS 6 each  6 each Topical Daily [name], MD   6 each at 06/17/19 0950  . Darbepoetin Alfa (ARANESP) injection 200 mcg  200 mcg Intravenous Q Thu-HD Loren Racer, PA-C   200 mcg at 06/16/19 1440  . dicyclomine (BENTYL) capsule 10 mg  10 mg Oral TID AC & HS [name], MD   10 mg at 06/18/19 0932  . diphenhydrAMINE (BENADRYL) capsule 25 mg  25 mg Oral QHS PRN [name], MD   25 mg at 06/16/19 2353  . docusate sodium (ENEMEEZ) enema 283 mg  1 enema Rectal PRN [name], MD      . feeding supplement (NEPRO CARB STEADY) liquid 237 mL  237 mL Oral TID PRN [name], MD      . fentaNYL (SUBLIMAZE) injection 25-50 mcg  25-50 mcg Intravenous Q2H PRN [name], MD   50 mcg at 06/15/19 2015  . ferric citrate (AURYXIA) tablet 630 mg  630 mg Oral TID WC [name], MD   630 mg at 06/18/19 3557  . gabapentin (NEURONTIN) capsule 300 mg  300 mg Oral QHS [name], MD   300 mg at 06/17/19 2245  . haloperidol lactate (HALDOL) injection 1 mg  1 mg Intravenous Q6H PRN Georgette Shell, MD      . ipratropium-albuterol (DUONEB) 0.5-2.5 (3) MG/3ML nebulizer solution 3 mL  3 mL Inhalation QID PRN [name], MD      . isosorbide mononitrate (IMDUR) 24 hr tablet 30 mg  30 mg Oral QHS [name], MD   30 mg at 06/17/19 2245  . metoprolol tartrate (LOPRESSOR) tablet 100 mg  100 mg Oral BID [name], MD   100 mg at 06/17/19 2248  . mometasone-formoterol (DULERA) 200-5 MCG/ACT inhaler 2 puff  2 puff Inhalation BID [name], MD   2 puff at 06/18/19 0857  . ondansetron  (ZOFRAN) tablet 4 mg  4 mg Oral Q6H PRN [name], MD       Or  . ondansetron [facility]) injection 4 mg  4 mg Intravenous Q6H PRN [name], MD   4 mg at 06/18/19 0020  . pantoprazole (PROTONIX) injection 40 mg  40 mg Intravenous Q12H [name], MD  40 mg at 06/18/19 1058  . pravastatin (PRAVACHOL) tablet 80 mg  80 mg Oral QPM [name], MD   80 mg at 06/17/19 1654  . sorbitol 70 % solution 30 mL  30 mL Oral PRN [name], MD      . sucralfate (CARAFATE) tablet 1 g  1 g Oral Q6H [name], MD   1 g at 06/18/19 9558     Discharge Medications: Please see discharge summary for a list of discharge medications.  Relevant Imaging Results:  Relevant Lab Results:   Additional Information HD: [facility]; SS# [id number]  Amador Cunas, [place]

## 2019-06-18 NOTE — Progress Notes (Signed)
SLP Cancellation Note  Patient Details Name: Christina [name] MRN: [id number] DOB: 16-Aug-1950   Cancelled treatment:       Reason Eval/Treat Not Completed: Patient at procedure or test/unavailable.  Pt is currently in hemodialysis.  SLP will f/u for cognitive-linguistic evaluation as schedule allows.     Elvia Collum Rodina Pinales 06/18/2019, 3:10 PM

## 2019-06-18 NOTE — Social Work (Signed)
Per RN, pt with some confusion overnight and today. Multiple attempts to reach pt's dtr at listed numbers with vm left requesting return call, but have not received call at this time. PT recommending SNF, will need to confirm dc plan with pt's family. Previous CM note states pt plans to return home with [facility].  FL2/PASRR complete. Will initiate SNF search once able to discuss plan with family. Pt will require auth for SNF.   Wandra Feinstein, MSW, LCSW [phone] (coverage)

## 2019-06-19 ENCOUNTER — Inpatient Hospital Stay (HOSPITAL_COMMUNITY): Payer: Medicare Other

## 2019-06-19 DIAGNOSIS — I6340 Cerebral infarction due to embolism of unspecified cerebral artery: Secondary | ICD-10-CM | POA: Insufficient documentation

## 2019-06-19 LAB — CBC
HCT: 25.5 % — ABNORMAL LOW (ref 36.0–46.0)
Hemoglobin: 7.9 g/dL — ABNORMAL LOW (ref 12.0–15.0)
MCH: 28.7 pg (ref 26.0–34.0)
MCHC: 31 g/dL (ref 30.0–36.0)
MCV: 92.7 fL (ref 80.0–100.0)
Platelets: 190 10*3/uL (ref 150–400)
RBC: 2.75 MIL/uL — ABNORMAL LOW (ref 3.87–5.11)
RDW: 18.1 % — ABNORMAL HIGH (ref 11.5–15.5)
WBC: 7 10*3/uL (ref 4.0–10.5)
nRBC: 0.3 % — ABNORMAL HIGH (ref 0.0–0.2)

## 2019-06-19 LAB — LIPID PANEL
Cholesterol: 86 mg/dL (ref 0–200)
HDL: 37 mg/dL — ABNORMAL LOW
LDL Cholesterol: 31 mg/dL (ref 0–99)
Total CHOL/HDL Ratio: 2.3 ratio
Triglycerides: 92 mg/dL
VLDL: 18 mg/dL (ref 0–40)

## 2019-06-19 LAB — HEMOGLOBIN A1C
Hgb A1c MFr Bld: 4.9 % (ref 4.8–5.6)
Mean Plasma Glucose: 93.93 mg/dL

## 2019-06-19 MED ORDER — STROKE: EARLY STAGES OF RECOVERY BOOK
Freq: Once | Status: DC
  Filled 2019-06-19: qty 1

## 2019-06-19 MED ORDER — IOHEXOL 350 MG/ML SOLN
75.0000 mL | Freq: Once | INTRAVENOUS | Status: AC | PRN
  Administered 2019-06-19: 75 mL via INTRAVENOUS

## 2019-06-19 NOTE — Progress Notes (Signed)
[facility] Progress Note   Subjective:   Alert, oriented. No complaints this am. Completed dialysis yesterday with 1L UF. Standing weight done in room this am-- 49.6kg. No signs of volume on exam.   Objective Vitals:   06/18/19 2122 06/19/19 0442 06/19/19 0805 06/19/19 0927  BP: (!) 149/65 (!) 156/43 (!) 144/63   Pulse: 67 61 60   Resp: 15 (!) 21    Temp: 98.2 F (36.8 C) 98.6 F (37 C) 98.8 F (37.1 C)   TempSrc: Oral Oral Oral   SpO2: 98% 93% 95% 96%  Weight:  51.1 kg    Height:       Physical Exam General: Frail woman, nad  Heart: RRR; systolic murmur noted Lungs: Clear, bilaterally  Abdomen: soft, non-tender Extremities: no LE edema; ?ischemic changes to B 5th toes Dialysis Access: TDC + LUE AVF + thrill  Additional Objective Labs: Basic Metabolic Panel: Recent Labs  Lab 06/16/19 1257 06/17/19 1130 06/18/19 0410  NA 135 136 129*  K 3.8 4.2 4.6  CL 99 100 95*  CO2 27 23 25   GLUCOSE 104* 140* 100*  BUN 24* 17 21  CREATININE 2.87* 2.74* 3.59*  CALCIUM 7.2* 7.6* 7.6*  PHOS 4.5 4.0  --    Liver Function Tests: Recent Labs  Lab 06/14/19 1010 06/14/19 1010 06/16/19 1257 06/17/19 1130 06/18/19 0410  AST 16  --   --   --  21  ALT 13  --   --   --  12  ALKPHOS 50  --   --   --  49  BILITOT 1.0  --   --   --  0.9  PROT 4.4*  --   --   --  4.0*  ALBUMIN 2.1*   < > 1.6* 2.1* 2.1*   < > = values in this interval not displayed.   Recent Labs  Lab 06/14/19 1010  LIPASE 42   CBC: Recent Labs  Lab 06/14/19 1010 06/14/19 1852 06/15/19 0744 06/15/19 0744 06/15/19 1616 06/16/19 0644 06/17/19 1130  WBC 7.7  --  11.3*   < > 12.7* 11.4* 6.5  NEUTROABS 6.5  --   --   --   --   --   --   HGB 7.1*   < > 8.5*   < > 8.2* 9.0* 8.2*  HCT 23.9*   < > 25.4*   < > 25.5* 27.1* 26.7*  MCV 91.2  --  86.7  --  88.5 86.3 91.8  PLT 202  --  126*   < > 128* 136* 141*   < > = values in this interval not displayed.   Medications:  .  stroke: mapping  our early stages of recovery book   Does not apply Once  . allopurinol  100 mg Oral Daily  . amiodarone  200 mg Oral BID  . aspirin  81 mg Oral Daily  . Chlorhexidine Gluconate Cloth  6 each Topical Daily  . clopidogrel  75 mg Oral Daily  . darbepoetin (ARANESP) injection - DIALYSIS  200 mcg Intravenous Q Thu-HD  . dicyclomine  10 mg Oral TID AC & HS  . ferric citrate  630 mg Oral TID WC  . gabapentin  300 mg Oral QHS  . isosorbide mononitrate  30 mg Oral QHS  . metoprolol tartrate  100 mg Oral BID  . mometasone-formoterol  2 puff Inhalation BID  . pantoprazole (PROTONIX) IV  40 mg Intravenous Q12H  . pravastatin  80 mg Oral QPM  . sucralfate  1 g Oral Q6H    Dialysis Orders: TTS at [place], 350/A1.5, EDW 40.5kg, 2K/2Ca, UFP #2, AVF + TDC, no heparin - Mircera 268mcg IV q 2 weeks (last 5/6) - Venofer 100 x 10 ordered - Calcitriol 54mcg PO q HD  Assessment/Plan: 1. UGIB: Melena/hematemesis on admit -> given 3U PRBCs. EGD 5/20 with duodenal ulcers noted. 2. A-fib IPP:[id number], started on amiodarone by cardiology. No AC d/t GIB.  3. Hyperkalemia: S/p HD 5/19 to correct.  Resolved.  4. ESRD:Usual TTS schedule - had missed 1 week HD. Back on schedule. Next HD 5/25.  5. Hypertension/volume:BP stable - no edema on exam but up per weights. Standing weight on 5/23 was 49.6kg. No volume on exam. Checking CXR today -if clear likely needs dry weight adjusted.   6. Anemia(ABLA + ESRD):See #1. Aranesp 216mcg given 2/81. 7. Metabolic bone disease:CorrCa ok - Cont Auryxia binder.  8. Hx mesenteric ischemia: Hx SMA stent 3/31 9. PAD: ?ischemic L 4th/5th toes. 10. AMS/Acute CVA  5/20 - unclear trigger. MRI Brain -chronic ischemic disease w tiny acute cortical infarct in the left occipital - per primary/neuro. Appears at baseline today.   Lynnda Child PA-C [facility] 06/19/2019,10:17 AM

## 2019-06-19 NOTE — CV Procedure (Signed)
Echocardiogram not completed patient undergoing other procedures.  Darlina Sicilian RDCS

## 2019-06-19 NOTE — Evaluation (Signed)
Occupational Therapy Evaluation Patient Details Name: Christina [name] MRN: [id number] DOB: October 12, 1950 Today's Date: 06/19/2019    History of Present Illness  69 y.o. female with medical history significant of SBO; HTN; HLD; depression; CAD with stents; COPD; ESRD on TTS HD; and afib presenting with hematemesis. Experienced bowel prolapse in ED and required 3U PRBC due to HGB below 7. Admitted 06/14/19 for treatment of GI bleed and Afib with RVR   Clinical Impression   Pt admitted with the above diagnoses and presents with below problem list. Pt will benefit from continued acute OT to address the below listed deficits and maximize independence with basic ADLs prior to d/c home. PTA pt was mod I to independent with ADLs. Pt is currently min guard with LB ADLs and functional mobility.       Follow Up Recommendations  Home health OT;Supervision - Intermittent(OOB/mobility)    Equipment Recommendations  3 in 1 bedside commode    Recommendations for Other Services       Precautions / Restrictions Precautions Precautions: Fall Restrictions Weight Bearing Restrictions: No      Mobility Bed Mobility Overal bed mobility: Needs Assistance Bed Mobility: Supine to Sit;Sit to Supine     Supine to sit: Supervision Sit to supine: Supervision   General bed mobility comments: supervision for safety  Transfers Overall transfer level: Needs assistance Equipment used: Rolling walker (2 wheeled) Transfers: Sit to/from Stand Sit to Stand: Min guard         General transfer comment: to/from EOB. min guard for safety. needs cues for technique with rw.    Balance Overall balance assessment: Needs assistance   Sitting balance-Leahy Scale: Fair       Standing balance-Leahy Scale: Fair                             ADL either performed or assessed with clinical judgement   ADL Overall ADL's : Needs assistance/impaired Eating/Feeding: Set up;Sitting   Grooming:  Standing;Min guard   Upper Body Bathing: Set up;Sitting   Lower Body Bathing: Min guard;Sit to/from stand   Upper Body Dressing : Set up;Sitting   Lower Body Dressing: Min guard;Sit to/from stand   Toilet Transfer: Min guard;Ambulation;RW   Toileting- Water quality scientist and Hygiene: Min guard;Sit to/from stand   Tub/ Shower Transfer: Min guard;Ambulation;Rolling walker   Functional mobility during ADLs: Min guard;Rolling walker General ADL Comments: Pt completed household distance functional mobility utilizing rw and min guard assist this session.     Vision         Perception     Praxis      Pertinent Vitals/Pain Pain Assessment: Faces Faces Pain Scale: Hurts a little bit Pain Location: L foot>R foot Pain Descriptors / Indicators: Guarding Pain Intervention(s): Monitored during session;Repositioned     Hand Dominance     Extremity/Trunk Assessment Upper Extremity Assessment Upper Extremity Assessment: Generalized weakness(edema in B hands noted)   Lower Extremity Assessment Lower Extremity Assessment: Defer to PT evaluation       Communication Communication Communication: No difficulties   Cognition Arousal/Alertness: Awake/alert Behavior During Therapy: Impulsive Overall Cognitive Status: No family/caregiver present to determine baseline cognitive functioning                                 General Comments: able to follow one step commands consistently. tangential responses at times. insists that she is  going home tomorrow. Pt giving some conflicting info regarding prior level of activity/function. Decreased problem solving and safety awareness.    General Comments       Exercises     Shoulder Instructions      Home Living Family/patient expects to be discharged to:: Private residence Living Arrangements: Children Available Help at Discharge: Family;Available 24 hours/day Type of Home: Apartment Home Access: Level entry(3 steps  with rails from parking lot to entrance door)     Home Layout: One level     Bathroom Shower/Tub: Hospital doctor Toilet: Handicapped height     Home Equipment: Environmental consultant - 2 wheels;Cane - single point          Prior Functioning/Environment Level of Independence: Independent        Comments: occasional use of walker. conflicting report of activity level: states she doesn't walk much outside her living space but also reports she is able to push a shopping cart around the store for shopping.         OT Problem List: Impaired balance (sitting and/or standing);Decreased knowledge of use of DME or AE;Decreased knowledge of precautions;Pain;Decreased activity tolerance;Decreased cognition      OT Treatment/Interventions: Self-care/ADL training;DME and/or AE instruction;Therapeutic activities;Patient/family education;Balance training    OT Goals(Current goals can be found in the care plan section) Acute Rehab OT Goals Patient Stated Goal: home tomorrow OT Goal Formulation: With patient Time For Goal Achievement: 07/03/19 Potential to Achieve Goals: Good ADL Goals Pt Will Perform Grooming: with modified independence;standing Pt Will Perform Upper Body Dressing: with modified independence;sitting Pt Will Perform Lower Body Dressing: with modified independence;sit to/from stand Pt Will Transfer to Toilet: ambulating;with modified independence Pt Will Perform Toileting - Clothing Manipulation and hygiene: with modified independence;sit to/from stand Pt Will Perform Tub/Shower Transfer: with modified independence;ambulating  OT Frequency: Min 2X/week   Barriers to D/C:            Co-evaluation              AM-PAC OT "6 Clicks" Daily Activity     Outcome Measure Help from another person eating meals?: None Help from another person taking care of personal grooming?: None Help from another person toileting, which includes using toliet, bedpan, or urinal?: A  Little Help from another person bathing (including washing, rinsing, drying)?: A Little Help from another person to put on and taking off regular upper body clothing?: None Help from another person to put on and taking off regular lower body clothing?: A Little 6 Click Score: 21   End of Session Equipment Utilized During Treatment: Rolling walker  Activity Tolerance: Patient tolerated treatment well;Patient limited by pain Patient left: in bed;with call bell/phone within reach;with bed alarm set  OT Visit Diagnosis: Unsteadiness on feet (R26.81);Muscle weakness (generalized) (M62.81);Pain;Other symptoms and signs involving cognitive function                Time: 2202-5427 OT Time Calculation (min): 16 min Charges:  OT General Charges $OT Visit: 1 Visit OT Evaluation $OT Eval Low Complexity: [place], OT Acute Rehabilitation Services Pager: [phone] Office: [phone]   Hortencia Pilar 06/19/2019, 11:46 AM

## 2019-06-19 NOTE — Progress Notes (Signed)
PROGRESS NOTE    Christina [name]  GEX:[id number] DOB: 1950-04-03 DOA: 06/14/2019 PCP: Maggie Schwalbe, PA-C  Brief Narrative:69 y.o.femalewith medical history significant ofSBO; HTN; HLD; depression; CAD with stents; COPD; ESRD on TTS HD; and afib presenting withhematemesis. I admitted her for abdominal pain on 1/25; this was thought to be related to missed HD and she was discharged on 1/27. She returned for abdominal pain from 1/31-2/2, again after missing HD. She had a subsequent ER visit and then hospitalization and presented again from 2/26-3/3 with abdominal pain and ABLA in the setting of cocaine use. EGD was performed and showed esophagitis, gastritis, and multiple duodenal ulcers. She was started on PPI and Carafate. She returned again on 3/8 with concern for hematemesis (hemoccult negative). 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 She also had afib with RVR. She was hospitalized again from 4/13-15 with acute respiratory failure from volume overload vs. COPD exacerbation. She was hospitalized at [facility] from 4/25-27 with afib with RVR; no AC due to large duodenal ulcer, discharged on metoprolol and Cardizem. Finally, she was hospitalized at [facility] from 5/8-9 with PAD and dry gangrene of a left foot digit; after discussion, the plan was for d/c with outpatient vascular follow up.  She reports hematemesis and large bloody BM at home this AM. She is having significant abdominal pain as well as LLE pain associated with dry gangrene. Upon arriving to the floor, she had a large bloody BM and was noted to have rectal prolapse. She reports that this happens frequently at home.   ED Course:Woke up this AM with coffee ground emesis and then had a bloody BM. Hgb 10 ->7.1. Abdominal pain out of proportion to exam - thickening  of bowel wall. Transfusing. GI to follow. Now in afib with RVR.   Assessment & Plan:   Principal Problem:   Acute GI bleeding Active Problems:   ESRD (end stage renal disease) (HCC)   CAD (coronary artery disease), native coronary artery   COPD with chronic bronchitis (HCC)   Essential hypertension   Hyperlipidemia   Cocaine abuse ([place])   Superior mesenteric artery atherosclerosis (HCC)   Atrial fibrillation with rapid ventricular response ([place])   Incomplete rectal prolapse   Cerebral embolism with cerebral infarction   #1 GI bleed--Status post EGD 06/16/2019 with multiple deep duodenal ulcers noted. These ulcers appeared very similar to the EGD findings from February 2021. Biopsy not done this time as the biopsies done in February showed no signs of cancer or infection.Will change Protonix to p.o. twice a day. Continue Protonix indefinitely. Received a dose of Feraheme 06/16/2019 Will need iron supplementation on discharge. Hemoglobin pending for today.  She is status post a high-grade SMA stenosis and stent placement. Vascular surgery was consulted for concern for ischemic bowel-status post SMA stent CT angiogram consistent with patent stent.He recommended to continue follow-up with Dr. Carlis Abbott for a mesenteric duplex.  #2 A. fib RVR not on anticoagulation due to GI bleeding history.Appreciate cardiology input.  Continue amiodarone and Lopressor.  Rate controlled.  #3 ESRD on dialysis Tuesday Thursday and Saturdays with a left upper arm fistula with a right IJ dialysis catheter in place  #4 peripheral vascular disease follow-up with vascular surgery as an outpatient.  Restart aspirin and Plavix now that the GI bleeding has resolved.   #5 chronic rectal prolapse patient does not want surgery  done  #6 history of essential hypertension-blood pressure  180/66 102/75 continue current medications   #7 history of COPD stable  #8 history of hyperlipidemia continue  statin  #9 history of cocaine abuse toc consulted for resources and counseling.  #10 hyperkalemia patient on dialysispotassium 4.2  #11 stroke -MRI tiny acute cortical infarct in the left occipital lobe located near a chronic left PCA infarct.advanced chronic ischemic disease in the bilateral cerebellum and bilateral territories Neurology consulted. Hemoglobin A1c is 4.9. LDL 31 total cholesterol 86 triglycerides 92 CT angiogram of the head and neck -negative for large vessel occlusion, positive for bulky mural soft plaque versus thrombus throughout the aortic arch, positive for occluded left PCA superior P3 branch positive for significant atherosclerotic stenosis left CCA 60% stenosis at the level of the thyroid moderate bilateral vertebral artery origin stenosis, severe right vertebral V4 segment stenosis, bilateral ICA stenosis moderate on the left moderate to severe on the right and moderate right PCA P2 stenosis Echo pending   Estimated body mass index is 20.59 kg/m as calculated from the following:   Height as of this encounter: 5\' 2"  (1.575 m).   Weight as of this encounter: 51.1 kg. DVT prophylaxis:scd Code Status:dnr Family Communication:dw patient attempted to reach daughter with no response. Disposition Plan:Status is: Inpatient  Dispo: The patient is from:home Anticipated d/c is LO:[facility] nursing facility anticipated d/c date RJ:JOACZYS Patient currentlyis not medically stable to d/c.  Patient with new stroke neuro work-up pending   Consultants:GI, vascular surgery, cardiology nephrology, neurology  Procedures: None Antimicrobialsnone   Subjective: Patient resting in bed she is awake and alert.  She is very adamant she will only go home she will not want to go to a rehab.  She lives with her daughter at home.  Objective: Vitals:   06/19/19 0442 06/19/19 0805 06/19/19 0927 06/19/19 1200  BP: (!) 156/43 (!)  144/63  102/75  Pulse: 61 60    Resp: (!) 21   15  Temp: 98.6 F (37 C) 98.8 F (37.1 C)    TempSrc: Oral Oral    SpO2: 93% 95% 96%   Weight: 51.1 kg     Height:        Intake/Output Summary (Last 24 hours) at 06/19/2019 1447 Last data filed at 06/19/2019 1054 Gross per 24 hour  Intake --  Output 1250 ml  Net -1250 ml   Filed Weights   06/18/19 0434 06/18/19 1206 06/19/19 0442  Weight: 49.8 kg 50.2 kg 51.1 kg    Examination:  General exam: Appears calm and comfortable  Respiratory system: Clear to auscultation. Respiratory effort normal. Cardiovascular system: S1 & S2 heard, RRR. No JVD, murmurs, rubs, gallops or clicks. No pedal edema. Gastrointestinal system: Abdomen is nondistended, soft and nontender. No organomegaly or masses felt. Normal bowel sounds heard. Central nervous system: Alert and oriented. No focal neurological deficits. Extremities: Symmetric 5 x 5 power. Skin: No rashes, lesions or ulcers Psychiatry: Judgement and insight appear normal. Mood & affect appropriate.     Data Reviewed: I have personally reviewed following labs and imaging studies  CBC: Recent Labs  Lab 06/14/19 1010 06/14/19 1010 06/14/19 1852 06/15/19 0744 06/15/19 1616 06/16/19 0644 06/17/19 1130  WBC 7.7  --   --  11.3* 12.7* 11.4* 6.5  NEUTROABS 6.5  --   --   --   --   --   --   HGB 7.1*   < > 6.0* 8.5* 8.2* 9.0* 8.2*  HCT 23.9*   < >  19.5* 25.4* 25.5* 27.1* 26.7*  MCV 91.2  --   --  86.7 88.5 86.3 91.8  PLT 202  --   --  126* 128* 136* 141*   < > = values in this interval not displayed.   Basic Metabolic Panel: Recent Labs  Lab 06/14/19 1010 06/15/19 0744 06/16/19 1257 06/17/19 1130 06/18/19 0410  NA 137 137 135 136 129*  K 5.5* 5.6* 3.8 4.2 4.6  CL 101 109 99 100 95*  CO2 18* 16* 27 23 25   GLUCOSE 147* 81 104* 140* 100*  BUN 80* 103* 24* 17 21  CREATININE 6.71* 7.01* 2.87* 2.74* 3.59*  CALCIUM 7.7* 7.3* 7.2* 7.6* 7.6*  PHOS  --   --  4.5 4.0  --     GFR: Estimated Creatinine Clearance: 11.7 mL/min (A) (by C-G formula based on SCr of 3.59 mg/dL (H)). Liver Function Tests: Recent Labs  Lab 06/14/19 1010 06/16/19 1257 06/17/19 1130 06/18/19 0410  AST 16  --   --  21  ALT 13  --   --  12  ALKPHOS 50  --   --  49  BILITOT 1.0  --   --  0.9  PROT 4.4*  --   --  4.0*  ALBUMIN 2.1* 1.6* 2.1* 2.1*   Recent Labs  Lab 06/14/19 1010  LIPASE 42   Recent Labs  Lab 06/17/19 1450  AMMONIA 25   Coagulation Profile: Recent Labs  Lab 06/14/19 1010  INR 1.2   Cardiac Enzymes: No results for input(s): CKTOTAL, CKMB, CKMBINDEX, TROPONINI in the last 168 hours. BNP (last 3 results) No results for input(s): PROBNP in the last 8760 hours. HbA1C: Recent Labs    06/19/19 0312  HGBA1C 4.9   CBG: No results for input(s): GLUCAP in the last 168 hours. Lipid Profile: Recent Labs    06/19/19 0312  CHOL 86  HDL 37*  LDLCALC 31  TRIG 92  CHOLHDL 2.3   Thyroid Function Tests: Recent Labs    06/17/19 1130 06/17/19 1450  TSH 9.456*  --   FREET4  --  1.09  T3FREE  --  1.5*   Anemia Panel: No results for input(s): VITAMINB12, FOLATE, FERRITIN, TIBC, IRON, RETICCTPCT in the last 72 hours. Sepsis Labs: Recent Labs  Lab 06/14/19 1610 06/14/19 1852 06/15/19 0744  LATICACIDVEN 1.8 3.8* 0.8    Recent Results (from the past 240 hour(s))  SARS Coronavirus 2 by RT PCR (hospital order, performed in [facility] hospital lab) Nasopharyngeal Nasopharyngeal Swab     Status: None   Collection Time: 06/14/19  3:36 PM   Specimen: Nasopharyngeal Swab  Result Value Ref Range Status   SARS Coronavirus 2 NEGATIVE NEGATIVE Final    Comment: (NOTE) SARS-CoV-2 target nucleic acids are NOT DETECTED. The SARS-CoV-2 RNA is generally detectable in upper and lower respiratory specimens during the acute phase of infection. The lowest concentration of SARS-CoV-2 viral copies this assay can detect is 250 copies / mL. A negative result does not  preclude SARS-CoV-2 infection and should not be used as the sole basis for treatment or other patient management decisions.  A negative result may occur with improper specimen collection / handling, submission of specimen other than nasopharyngeal swab, presence of viral mutation(s) within the areas targeted by this assay, and inadequate number of viral copies (<250 copies / mL). A negative result must be combined with clinical observations, patient history, and epidemiological information. Fact Sheet for Patients:   StrictlyIdeas.no Fact Sheet for Healthcare  Providers: BankingDealers.co.za This test is not yet approved or cleared  by the [place] and has been authorized for detection and/or diagnosis of SARS-CoV-2 by FDA under an Emergency Use Authorization (EUA).  This EUA will remain in effect (meaning this test can be used) for the duration of the COVID-19 declaration under Section 564(b)(1) of the Act, 21 U.S.C. section 360bbb-3(b)(1), unless the authorization is terminated or revoked sooner. Performed at [facility], [place] [address] [place], [place] [address]   Culture, blood (Routine X 2) w Reflex to ID Panel     Status: None (Preliminary result)   Collection Time: 06/17/19 12:18 PM   Specimen: BLOOD RIGHT ARM  Result Value Ref Range Status   Specimen Description BLOOD RIGHT ARM  Final   Special Requests   Final    BOTTLES DRAWN AEROBIC AND ANAEROBIC Blood Culture results may not be optimal due to an inadequate volume of blood received in culture bottles   Culture   Final    NO GROWTH 2 DAYS Performed at [facility], [place] [address], [place], [place] [address]    Report Status PENDING  Incomplete  Culture, blood (Routine X 2) w Reflex to ID Panel     Status: None (Preliminary result)   Collection Time: 06/17/19 12:23 PM   Specimen: BLOOD RIGHT HAND  Result Value Ref Range Status   Specimen Description  BLOOD RIGHT HAND  Final   Special Requests   Final    BOTTLES DRAWN AEROBIC ONLY Blood Culture results may not be optimal due to an inadequate volume of blood received in culture bottles   Culture   Final    NO GROWTH 2 DAYS Performed at [facility], [place] [address], [place],  [address]    Report Status PENDING  Incomplete         Radiology Studies: CT ANGIO HEAD W OR WO CONTRAST  Result Date: 06/19/2019 CLINICAL DATA:  69 year old female dialysis patient with encephalopathy, confusion after dialysis. Recent MRI demonstrating a small chronic left occipital cortical infarct, nearby chronic left occipital pole encephalomalacia, underlying advanced chronic ischemic disease. EXAM: CT ANGIOGRAPHY HEAD AND NECK TECHNIQUE: Multidetector CT imaging of the head and neck was performed using the standard protocol during bolus administration of intravenous contrast. Multiplanar CT image reconstructions and MIPs were obtained to evaluate the vascular anatomy. Carotid stenosis measurements (when applicable) are obtained utilizing NASCET criteria, using the distal internal carotid diameter as the denominator. CONTRAST:  74mL OMNIPAQUE IOHEXOL 350 MG/ML SOLN COMPARISON:  Brain MRI and head CT 06/17/2019. Neck CT without contrast 07/11/2016 FINDINGS: CT HEAD Brain: Subtle cortical hypodensity now on the left lateral occiput corresponding to the DWI lesion (series 5, image 14). No acute intracranial hemorrhage or mass effect. Otherwise Stable non contrast CT appearance of the brain. Calvarium and skull base: No acute osseous abnormality identified. Paranasal sinuses: Visualized paranasal sinuses and mastoids are stable and well pneumatized. Orbits: Stable orbit and scalp soft tissues. CTA NECK Skeleton: Absent maxillary dentition. Advanced cervical spine degeneration with reversal of lordosis. Chronic deformity posterior right 1st and 2nd ribs. No acute osseous abnormality identified. Upper chest: Small  layering pleural effusions are partially visible. Mild mosaic pulmonary attenuation. Visible precarinal and AP window lymph nodes are at the upper limits of normal. Right IJ approach dual lumen dialysis catheter. Other neck: No acute findings.  No cervical lymphadenopathy. Aortic arch: Severe atherosclerosis of the visible aorta including bulky mural plaque or thrombus throughout  the arch (series 12, images 80 through 134). Three vessel arch configuration. Right carotid system: No brachiocephalic artery or right CCA origin stenosis despite plaque. Tortuous proximal right CCA. Intermittent plaque to the bifurcation without stenosis. Less than 50 % stenosis with respect to the distal vessel of the proximal right ICA despite plaque. Left carotid system: Prominent plaque at the left CCA origin and along the ventral vessel at the thoracic inlet but less than 50% stenosis (series 9, image 132). At the level of the thyroid combined soft and calcified plaque results in 60% stenosis (series 9, image 122). Capacious left carotid bifurcation with surgical clips compatible with previous endarterectomy. Tortuous left ICA distal to the bulb without stenosis to the skull base. Vertebral arteries: Proximal right subclavian soft and calcified plaque similar to the other great vessels. Moderate stenosis right vertebral artery origin best seen on series 12. Tortuous right V1 segment. No additional right vertebral stenosis to the skull base. Widespread proximal left subclavian artery plaque similar to the other great vessels without significant stenosis. Calcified plaque at the left vertebral artery origin, moderate stenosis best seen on series 11, image 262. Tortuous left V1 segment. Codominant left vertebral artery otherwise patent to the skull base. CTA HEAD Posterior circulation: Bilateral V4 segment soft and calcified plaque. No significant stenosis on the left but moderate to severe right V4 stenosis just distal to PICA on series  12, image 113. Normal left PICA. Patent vertebrobasilar junction. Patent basilar artery with only mild irregularity. Normal SCA and PCA origins. Posterior communicating arteries are diminutive or absent. The left PCA P3 superior division is occluded, although there is chronic encephalomalacia in the left PCA territory also. On the right there is mild to moderate P1 and P2 irregularity and stenosis. Anterior circulation: Both ICA siphons are patent. Heavy calcification through the left cavernous segment and anterior genu with mild to moderate stenosis. Similar right siphon calcified plaque with at least moderate stenosis at the anterior genu. Patent carotid termini. Normal MCA and ACA origins. Anterior communicating artery and bilateral ACA branches are within normal limits. Left MCA M1 segment and bifurcation are patent without stenosis. Right MCA M1 segment and bifurcation are patent without stenosis. Bilateral MCA branches are within normal limits. Venous sinuses: Early contrast timing but grossly patent. Anatomic variants: None. Review of the MIP images confirms the above findings IMPRESSION: 1. Negative for large vessel occlusion. 2. Positive for bulky Mural Soft Plaque versus Thrombus throughout the Aortic Arch. See series 12 images 80-134. Aortic Atherosclerosis (ICD10-I70.0). 3. Positive for occluded Left PCA superior P3 branch, although age indeterminate in light of some chronic left PCA territory encephalomalacia. 4. Positive also for significant atherosclerotic stenoses: - Left CCA 60% stenosis at the level of the thyroid. - moderate bilateral vertebral artery origin stenosis, and up to Severe Right Vertebral V4 segment stenosis distal to PICA. - bilateral ICA siphon stenosis, up to moderate on the Left and Moderate To Severe at the Right ICA anterior genu. - moderate Right PCA P2 stenosis. 5.  Stable non contrast CT appearance of the brain. 6. Small bilateral pleural effusions. Advanced cervical spine  degeneration. Electronically Signed   By: Genevie Ann M.D.   On: 06/19/2019 13:25   DG Chest 1 View  Result Date: 06/17/2019 CLINICAL DATA:  Hematemesis, history small bowel obstruction, hypertension, coronary artery disease post stenting, COPD, end-stage renal disease on hemodialysis, anemia secondary to GI bleed EXAM: CHEST  1 VIEW COMPARISON:  Portable exam 1506 hours compared to 06/14/2019  FINDINGS: RIGHT jugulars dialysis catheter with tip projecting over RIGHT atrium. Enlargement of cardiac silhouette with slight pulmonary vascular congestion. Atherosclerotic calcification aorta. Minimal perihilar to basilar pulmonary infiltrates consistent with pulmonary edema. No pleural effusion or pneumothorax. Bones demineralized. IMPRESSION: Enlargement of cardiac silhouette with pulmonary vascular congestion and minimal pulmonary edema. Electronically Signed   By: Lavonia Dana M.D.   On: 06/17/2019 15:26   CT HEAD WO CONTRAST  Result Date: 06/17/2019 CLINICAL DATA:  Delirium/confusion after dialysis EXAM: CT HEAD WITHOUT CONTRAST TECHNIQUE: Contiguous axial images were obtained from the base of the skull through the vertex without intravenous contrast. COMPARISON:  July 17, 2018 maxillofacial CT covering much of the brain parenchyma FINDINGS: Brain: There is mild diffuse atrophy. There is no intracranial mass, hemorrhage, extra-axial fluid collection, or midline shift. There is widespread small vessel disease throughout the centra semiovale bilaterally. There is a prior appearing infarct in the superior mid left frontal lobe with involvement of both gray and white matter in this area. There is a prior appearing infarct in the posterosuperior left parietal lobe at the gray-white junction. There is evidence of a prior infarct in the medial right parietooccipital junction region. There is a prior infarct in the head of the caudate nucleus on the right. There is an age uncertain infarct in the left thalamus which has  somewhat indistinct borders suggesting that this infarct may be recent/acute. There are small lacunar type infarcts in each cerebellar hemisphere, more on the right than on the left. Vascular: No hyperdense vessel. There are foci of calcification in each carotid siphon and distal vertebral artery region. Skull: The bony calvarium a appears intact. Sinuses/Orbits: Paranasal sinuses are clear. There is rightward deviation of the nasal septum. Orbits appear symmetric bilaterally. Other: Mastoid air cells are clear. There is debris in each external auditory canal. Brain: It IMPRESSION: Mild atrophy. Extensive supratentorial small vessel disease. 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 And head of caudate nucleus on the right. Suspect more recent and potentially acute focal infarct in the left thalamus. Lacunar type infarcts noted in the cerebellar hemispheres bilaterally, more on the right than on the left. No mass or hemorrhage. Foci of arterial vascular calcification noted at multiple levels. Probable cerumen in each external auditory canal. Deviated nasal septum. Electronically Signed   By: Lowella Grip III M.D.   On: 06/17/2019 16:19   CT ANGIO NECK W OR WO CONTRAST  Result Date: 06/19/2019 CLINICAL DATA:  69 year old female dialysis patient with encephalopathy, confusion after dialysis. Recent MRI demonstrating a small chronic left occipital cortical infarct, nearby chronic left occipital pole encephalomalacia, underlying advanced chronic ischemic disease. EXAM: CT ANGIOGRAPHY HEAD AND NECK TECHNIQUE: Multidetector CT imaging of the head and neck was performed using the standard protocol during bolus administration of intravenous contrast. Multiplanar CT image reconstructions and MIPs were obtained to evaluate the vascular anatomy. Carotid stenosis measurements (when applicable) are obtained  utilizing NASCET criteria, using the distal internal carotid diameter as the denominator. CONTRAST:  57mL OMNIPAQUE IOHEXOL 350 MG/ML SOLN COMPARISON:  Brain MRI and head CT 06/17/2019. Neck CT without contrast 07/11/2016 FINDINGS: CT HEAD Brain: Subtle cortical hypodensity now on the left lateral occiput corresponding to the DWI lesion (series 5, image 14). No acute intracranial hemorrhage or mass effect. Otherwise Stable non contrast CT appearance of the brain. Calvarium and skull base: No acute osseous abnormality identified. Paranasal sinuses: Visualized paranasal sinuses and mastoids  are stable and well pneumatized. Orbits: Stable orbit and scalp soft tissues. CTA NECK Skeleton: Absent maxillary dentition. Advanced cervical spine degeneration with reversal of lordosis. Chronic deformity posterior right 1st and 2nd ribs. No acute osseous abnormality identified. Upper chest: Small layering pleural effusions are partially visible. Mild mosaic pulmonary attenuation. Visible precarinal and AP window lymph nodes are at the upper limits of normal. Right IJ approach dual lumen dialysis catheter. Other neck: No acute findings.  No cervical lymphadenopathy. Aortic arch: Severe atherosclerosis of the visible aorta including bulky mural plaque or thrombus throughout the arch (series 12, images 80 through 134). Three vessel arch configuration. Right carotid system: No brachiocephalic artery or right CCA origin stenosis despite plaque. Tortuous proximal right CCA. Intermittent plaque to the bifurcation without stenosis. Less than 50 % stenosis with respect to the distal vessel of the proximal right ICA despite plaque. Left carotid system: Prominent plaque at the left CCA origin and along the ventral vessel at the thoracic inlet but less than 50% stenosis (series 9, image 132). At the level of the thyroid combined soft and calcified plaque results in 60% stenosis (series 9, image 122). Capacious left carotid bifurcation with  surgical clips compatible with previous endarterectomy. Tortuous left ICA distal to the bulb without stenosis to the skull base. Vertebral arteries: Proximal right subclavian soft and calcified plaque similar to the other great vessels. Moderate stenosis right vertebral artery origin best seen on series 12. Tortuous right V1 segment. No additional right vertebral stenosis to the skull base. Widespread proximal left subclavian artery plaque similar to the other great vessels without significant stenosis. Calcified plaque at the left vertebral artery origin, moderate stenosis best seen on series 11, image 262. Tortuous left V1 segment. Codominant left vertebral artery otherwise patent to the skull base. CTA HEAD Posterior circulation: Bilateral V4 segment soft and calcified plaque. No significant stenosis on the left but moderate to severe right V4 stenosis just distal to PICA on series 12, image 113. Normal left PICA. Patent vertebrobasilar junction. Patent basilar artery with only mild irregularity. Normal SCA and PCA origins. Posterior communicating arteries are diminutive or absent. The left PCA P3 superior division is occluded, although there is chronic encephalomalacia in the left PCA territory also. On the right there is mild to moderate P1 and P2 irregularity and stenosis. Anterior circulation: Both ICA siphons are patent. Heavy calcification through the left cavernous segment and anterior genu with mild to moderate stenosis. Similar right siphon calcified plaque with at least moderate stenosis at the anterior genu. Patent carotid termini. Normal MCA and ACA origins. Anterior communicating artery and bilateral ACA branches are within normal limits. Left MCA M1 segment and bifurcation are patent without stenosis. Right MCA M1 segment and bifurcation are patent without stenosis. Bilateral MCA branches are within normal limits. Venous sinuses: Early contrast timing but grossly patent. Anatomic variants: None.  Review of the MIP images confirms the above findings IMPRESSION: 1. Negative for large vessel occlusion. 2. Positive for bulky Mural Soft Plaque versus Thrombus throughout the Aortic Arch. See series 12 images 80-134. Aortic Atherosclerosis (ICD10-I70.0). 3. Positive for occluded Left PCA superior P3 branch, although age indeterminate in light of some chronic left PCA territory encephalomalacia. 4. Positive also for significant atherosclerotic stenoses: - Left CCA 60% stenosis at the level of the thyroid. - moderate bilateral vertebral artery origin stenosis, and up to Severe Right Vertebral V4 segment stenosis distal to PICA. - bilateral ICA siphon stenosis, up to moderate on the Left and  Moderate To Severe at the Right ICA anterior genu. - moderate Right PCA P2 stenosis. 5.  Stable non contrast CT appearance of the brain. 6. Small bilateral pleural effusions. Advanced cervical spine degeneration. Electronically Signed   By: Genevie Ann M.D.   On: 06/19/2019 13:25   MR BRAIN WO CONTRAST  Result Date: 06/17/2019 CLINICAL DATA:  69 year old female with encephalopathy. Confusion after dialysis. EXAM: MRI HEAD WITHOUT CONTRAST TECHNIQUE: Multiplanar, multiecho pulse sequences of the brain and surrounding structures were obtained without intravenous contrast. COMPARISON:  Head CT without contrast earlier today. FINDINGS: The examination had to be discontinued prior to completion by patient request. Diffusion-weighted imaging, axial T2 imaging, and moderately degraded sagittal T1 imaging only was obtained. Major intracranial vascular flow voids are preserved. There is a tiny focus of restricted diffusion in the cortex of the left lateral occipital lobe. No other convincing restricted diffusion. See series 5, image 69. Nearby chronic cortical encephalomalacia in the left occipital pole. There are scattered small chronic infarcts in both cerebellar hemispheres, in the bilateral basal ganglia. There are chronic cortical  infarcts in the bilateral MCA territories, and right posterior MCA/PCA watershed with cortical encephalomalacia. Superimposed confluent bilateral cerebral white matter T2 hyperintensity. No midline shift, mass effect, evidence of mass lesion, ventriculomegaly, extra-axial collection or acute intracranial hemorrhage is evident. Paranasal Visualized paranasal sinuses and mastoids are stable and well pneumatized. Visible internal auditory structures appear normal. Mildly asymmetric size of the right superior ophthalmic vein, but otherwise negative orbits. Unremarkable noncontrast visible cavernous sinus. IMPRESSION: 1. Truncated exam by patient request. 2. Positive for a tiny acute cortical infarct in the left occipital lobe, located near a chronic left PCA infarct. 3. Underlying advanced chronic ischemic disease in the bilateral cerebellum and bilateral MCA territories. Electronically Signed   By: Genevie Ann M.D.   On: 06/17/2019 19:10   DG CHEST PORT 1 VIEW  Result Date: 06/19/2019 CLINICAL DATA:  End-stage renal disease.  Dialysis. EXAM: PORTABLE CHEST 1 VIEW COMPARISON:  Jun 17, 2019 FINDINGS: The dialysis catheter terminates near the caval atrial junction. No pneumothorax. No overt edema. No nodules or masses. No focal infiltrates. There may be a small left effusion. The cardiomediastinal silhouette is stable. IMPRESSION: No overt edema. Stable dialysis catheter. There may be a small left effusion. Electronically Signed   By: Dorise Bullion III M.D   On: 06/19/2019 13:30        Scheduled Meds: .  stroke: mapping our early stages of recovery book   Does not apply Once  . allopurinol  100 mg Oral Daily  . amiodarone  200 mg Oral BID  . aspirin  81 mg Oral Daily  . Chlorhexidine Gluconate Cloth  6 each Topical Daily  . clopidogrel  75 mg Oral Daily  . darbepoetin (ARANESP) injection - DIALYSIS  200 mcg Intravenous Q Thu-HD  . dicyclomine  10 mg Oral TID AC & HS  . ferric citrate  630 mg Oral TID WC    . gabapentin  300 mg Oral QHS  . isosorbide mononitrate  30 mg Oral QHS  . metoprolol tartrate  100 mg Oral BID  . mometasone-formoterol  2 puff Inhalation BID  . pantoprazole (PROTONIX) IV  40 mg Intravenous Q12H  . pravastatin  80 mg Oral QPM  . sucralfate  1 g Oral Q6H   Continuous Infusions:   LOS: 5 days     Georgette Shell, MD 06/19/2019, 2:47 PM

## 2019-06-19 NOTE — Evaluation (Signed)
Speech Language Pathology Evaluation Patient Details Name: Christina [name] MRN: [id number] DOB: 1950-11-23 Today's Date: 06/19/2019 Time: 3335-4562 SLP Time Calculation (min) (ACUTE ONLY): 12 min  Problem List:  Patient Active Problem List   Diagnosis Date Noted  . Cerebral embolism with cerebral infarction 06/19/2019  . Atrial fibrillation with rapid ventricular response ([place]) 06/14/2019  . Incomplete rectal prolapse 06/14/2019  . Respiratory failure with hypoxia ([place]) 05/10/2019  . Pneumonia 05/10/2019  . 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 Duodenal ulcer perforation ([place]) 04/23/2019  . Cocaine abuse ([place]) 04/23/2019  . Acute gastric ulcer without hemorrhage or perforation   . Hematemesis 04/04/2019  . Atrial fibrillation with RVR ([place])   . Duodenal ulcer   . Gastritis and gastroduodenitis   . Acute blood loss anemia   . Ischemic bowel disease ([place]) 03/25/2019  . RUQ pain   . Anemia due to chronic kidney disease, on chronic dialysis ([place])   . AF (paroxysmal atrial fibrillation) ([place])   . Chest pain 03/19/2019  . Hyperkalemia 02/28/2019  . Uremia 02/27/2019  . Volume overload 02/21/2019  . Abdominal pain 02/21/2019  . Non-compliance with renal dialysis ([place]) 02/21/2019  . Respiratory failure ([place]) 02/04/2019  . Acute and chr resp failure, unsp w hypoxia or hypercapnia ([place])   . Afib ([place]) 01/25/2019  . SBO (small bowel obstruction) ([place]) 08/10/2018  . Anemia due to chronic blood loss   . Heme positive stool   . Platelet inhibition due to Plavix   . Acute GI bleeding 05/06/2018  . ESRD (end stage renal disease) ([place]) 05/06/2018  . Anxiety 05/06/2018  . Bipolar affective ([place]) 05/06/2018  . CAD (coronary artery disease), native coronary artery 05/06/2018  . COPD with chronic bronchitis ([place]) 05/06/2018  . CVA (cerebral vascular accident) ([place]) 05/06/2018  . Essential hypertension 05/06/2018  . Hyperlipidemia 05/06/2018   Past Medical History:  Past  Medical History:  Diagnosis Date  . Anemia   . Anxiety   . Arthritis   . Asthma   . Atrial fibrillation ([place])   . Chronic kidney disease    Dialysis T/Th/Sa  started in March 2020  . COPD (chronic obstructive pulmonary disease) ([place])   . Coronary artery disease    2 stents  . Depression   . GERD (gastroesophageal reflux disease)   . GI bleeding 06/15/2019  . Gout   . Headache    migraines  . History of kidney stones   . Hyperlipidemia   . Hypertension   . Pneumonia   . Small bowel obstruction [facility])    Past Surgical History:  Past Surgical History:  Procedure Laterality Date  . ABDOMINAL HYSTERECTOMY    . ABDOMINAL SURGERY     for small bowel obstruction - x 2  . APPENDECTOMY    . AV FISTULA PLACEMENT Left 08/04/2018   Procedure: ARTERIOVENOUS (AV) FISTULA CREATION LEFT ARM;  Surgeon: Waynetta Sandy, MD;  Location: [place];  Service: Vascular;  Laterality: Left;  . BASCILIC VEIN TRANSPOSITION Left 11/24/2018   Procedure: SECOND STAGE BASILIC VEIN TRANSPOSITION LEFT ARM;  Surgeon: Waynetta Sandy, MD;  Location: [place];  Service: Vascular;  Laterality: Left;  . BIOPSY  03/26/2019   Procedure: BIOPSY;  Surgeon: Lavena Bullion, DO;  Location: [place];  Service: Gastroenterology;;  . CARDIAC CATHETERIZATION    . CORONARY ANGIOPLASTY  ?2003/2004  . ESOPHAGOGASTRODUODENOSCOPY N/A 06/16/2019   Procedure: ESOPHAGOGASTRODUODENOSCOPY (EGD);  Surgeon: [name], MD;  Location[facility] ENDOSCOPY;  Service: Endoscopy;  Laterality: N/A;  . ESOPHAGOGASTRODUODENOSCOPY (EGD) WITH PROPOFOL N/A 03/26/2019   Procedure: ESOPHAGOGASTRODUODENOSCOPY (EGD) WITH PROPOFOL;  Surgeon: Lavena Bullion, DO;  Location: [place];  Service: Gastroenterology;  Laterality: N/A;  . FACIAL RECONSTRUCTION SURGERY     x 2  . HERNIA REPAIR    . PERIPHERAL VASCULAR INTERVENTION  04/27/2019   Procedure: PERIPHERAL VASCULAR INTERVENTION;  Surgeon: Marty Heck, MD;  Location:  [place] [facility];  Service: Cardiovascular;;  SMA  . VISCERAL ANGIOGRAPHY N/A 04/27/2019   Procedure: MESENTERIC ANGIOGRAPHY;  Surgeon: Marty Heck, MD;  Location: [place] [facility];  Service: Cardiovascular;  Laterality: N/A;   HPI:   69 y.o. female with medical history significant of SBO; HTN; HLD; depression; CAD with stents; COPD; ESRD on TTS HD; and afib presenting with hematemesis. Experienced bowel prolapse in ED and required 3U PRBC due to HGB below 7. Admitted 06/14/19 for treatment of GI bleed and Afib with RVR   Assessment / Plan / Recommendation Clinical Impression   Pt presents with mild-moderate cognitive deficits although it is unclear what her baseline level of functioning was prior to admission.  Pt's responses were tangential and she appeared to have decreased sustained attention to functional conversations with therapist.  Her answers to simple, personally relevant questions were at times appropriate but then would become more confused as the pt continued to speak.  For example, pt recalled earlier PT and OT therapy evaluations but repeatedly stated that one of the therapists "walked to the store" during her session.   Pt appeared to have decreased or at least fluctuating insight into her current deficits stating that there was "absolutely nothing different" about her but later stated that she had noticed changes in her memory recently.  As a result, pt would benefit from skilled ST while inpatient in order to maximize functional independence and reduce burden of care prior to discharge.  Anticipate that pt will also benefit from ST at next level of care in addition to 24/7 supervision once returned to her home environment.      SLP Assessment  SLP Recommendation/Assessment: Patient needs continued Speech Lanaguage Pathology Services SLP Visit Diagnosis: Cognitive communication deficit (R41.841)    Follow Up Recommendations  Home health SLP;24 hour supervision/assistance     Frequency and Duration min 1 x/week         SLP Evaluation Cognition  Overall Cognitive Status: No family/caregiver present to determine baseline cognitive functioning Arousal/Alertness: Awake/alert Orientation Level: Oriented X4 Attention: Sustained Sustained Attention: Impaired Sustained Attention Impairment: Verbal basic Memory: Impaired Memory Impairment: Decreased recall of new information Awareness: Impaired Awareness Impairment: Emergent impairment       Comprehension  Auditory Comprehension Overall Auditory Comprehension: Appears within functional limits for tasks assessed    Expression Expression Primary Mode of Expression: Verbal Verbal Expression Overall Verbal Expression: Appears within functional limits for tasks assessed   Oral / Motor  Oral Motor/Sensory Function Overall Oral Motor/Sensory Function: Within functional limits Motor Speech Overall Motor Speech: Appears within functional limits for tasks assessed   GO                    Emilio Math 06/19/2019, 3:55 PM

## 2019-06-19 NOTE — Progress Notes (Signed)
Physical Therapy Treatment Patient Details Name: Christina [name] MRN: [id number] DOB: 12/13/1950 Today's Date: 06/19/2019    History of Present Illness  69 y.o. female with medical history significant of SBO; HTN; HLD; depression; CAD with stents; COPD; ESRD on TTS HD; and afib presenting with hematemesis. Experienced bowel prolapse in ED and required 3U PRBC due to HGB below 7. Admitted 06/14/19 for treatment of GI bleed and Afib with RVR    PT Comments    Continuing work on functional mobility and activity tolerance;  Much improved from last session, especially with activity tolerance, and gait distance; updated PT POC to home with HHPT follow up; HR range 60-122 bpm   Follow Up Recommendations  Home health PT;Supervision/Assistance - 24 hour(at or near 24 jour assist)     Equipment Recommendations  Rolling walker with 5" wheels;3in1 (PT)(versus cane)    Recommendations for Other Services       Precautions / Restrictions Precautions Precautions: Fall    Mobility  Bed Mobility Overal bed mobility: Needs Assistance Bed Mobility: Supine to Sit;Sit to Supine     Supine to sit: Supervision Sit to supine: Supervision   General bed mobility comments: supervision for safety  Transfers Overall transfer level: Needs assistance   Transfers: Sit to/from Stand Sit to Stand: Min guard         General transfer comment: to/from EOB. min guard for safety.  Ambulation/Gait Ambulation/Gait assistance: Min guard Gait Distance (Feet): 180 Feet Assistive device: None Gait Pattern/deviations: Step-through pattern(erratic step width)     General Gait Details: walked without assitistive device in hallway and close guard for safety; HR 60 initially and had brief instance of incr to 120s, lasting approx 60 seconds   Stairs             Wheelchair Mobility    Modified Rankin (Stroke Patients Only)       Balance     Sitting balance-Leahy Scale: Fair       Standing  balance-Leahy Scale: Fair                              Cognition Arousal/Alertness: Awake/alert Behavior During Therapy: Impulsive Overall Cognitive Status: No family/caregiver present to determine baseline cognitive functioning Area of Impairment: Safety/judgement                         Safety/Judgement: Decreased awareness of safety;Decreased awareness of deficits     General Comments: able to follow one step commands consistently. tangential responses at times. insists that she is going home tomorrow. Pt giving some conflicting info regarding prior level of activity/function. Decreased problem solving and safety awareness.       Exercises      General Comments General comments (skin integrity, edema, etc.): HR range 60-122 during session      Pertinent Vitals/Pain Pain Assessment: No/denies pain Pain Intervention(s): Monitored during session    Home Living     Available Help at Discharge: Family;Available 24 hours/day Type of Home: Apartment              Prior Function            PT Goals (current goals can now be found in the care plan section) Acute Rehab PT Goals Patient Stated Goal: home tomorrow PT Goal Formulation: With patient Time For Goal Achievement: 07/01/19 Potential to Achieve Goals: Fair Progress towards PT goals: Progressing toward goals  Frequency    Min 3X/week      PT Plan Discharge plan needs to be updated;Frequency needs to be updated    Co-evaluation              AM-PAC PT "6 Clicks" Mobility   Outcome Measure  Help needed turning from your back to your side while in a flat bed without using bedrails?: None Help needed moving from lying on your back to sitting on the side of a flat bed without using bedrails?: None Help needed moving to and from a bed to a chair (including a wheelchair)?: None Help needed standing up from a chair using your arms (e.g., wheelchair or bedside chair)?: None Help  needed to walk in hospital room?: A Little Help needed climbing 3-5 steps with a railing? : A Lot 6 Click Score: 21    End of Session Equipment Utilized During Treatment: Gait belt Activity Tolerance: Patient tolerated treatment well Patient left: in bed;with call bell/phone within reach;with bed alarm set Nurse Communication: Mobility status PT Visit Diagnosis: Unsteadiness on feet (R26.81);Other abnormalities of gait and mobility (R26.89);Muscle weakness (generalized) (M62.81);Difficulty in walking, not elsewhere classified (R26.2)     Time: 5110-2111 PT Time Calculation (min) (ACUTE ONLY): 17 min  Charges:  $Gait Training: 8-22 mins                     Roney Marion, [place]  Acute Rehabilitation Services Pager [phone] Office [phone]    [place]letta [place] 06/19/2019, 6:02 PM

## 2019-06-19 NOTE — Progress Notes (Signed)
STROKE TEAM PROGRESS NOTE   INTERVAL HISTORY No family is at the bedside.  Pt sitting in bed, awake, alert, no complaints.  In high spirit, ready to go home tomorrow.  Denies any abdominal pain or bleeding at this time.  CT head and neck still pending given no IV access.  OBJECTIVE Vitals:   06/18/19 1600 06/18/19 1733 06/18/19 2122 06/19/19 0442  BP: (!) 126/54 (!) 164/60 (!) 149/65 (!) 156/43  Pulse: 60 66 67 61  Resp: 16 17 15  (!) 21  Temp:  98 F (36.7 C) 98.2 F (36.8 C) 98.6 F (37 C)  TempSrc:  Oral Oral Oral  SpO2:  92% 98% 93%  Weight:    51.1 kg  Height:        CBC:  Recent Labs  Lab 06/14/19 1010 06/14/19 1852 06/16/19 0644 06/17/19 1130  WBC 7.7   < > 11.4* 6.5  NEUTROABS 6.5  --   --   --   HGB 7.1*   < > 9.0* 8.2*  HCT 23.9*   < > 27.1* 26.7*  MCV 91.2   < > 86.3 91.8  PLT 202   < > 136* 141*   < > = values in this interval not displayed.    Basic Metabolic Panel:  Recent Labs  Lab 06/16/19 1257 06/16/19 1257 06/17/19 1130 06/18/19 0410  NA 135   < > 136 129*  K 3.8   < > 4.2 4.6  CL 99   < > 100 95*  CO2 27   < > 23 25  GLUCOSE 104*   < > 140* 100*  BUN 24*   < > 17 21  CREATININE 2.87*   < > 2.74* 3.59*  CALCIUM 7.2*   < > 7.6* 7.6*  PHOS 4.5  --  4.0  --    < > = values in this interval not displayed.    Lipid Panel:     Component Value Date/Time   CHOL 86 06/19/2019 0312   TRIG 92 06/19/2019 0312   HDL 37 (L) 06/19/2019 0312   CHOLHDL 2.3 06/19/2019 0312   VLDL 18 06/19/2019 0312   LDLCALC 31 06/19/2019 0312   HgbA1c:  Lab Results  Component Value Date   HGBA1C 4.9 06/19/2019   Urine Drug Screen:     Component Value Date/Time   LABOPIA NONE DETECTED 04/23/2019 1440   COCAINSCRNUR NONE DETECTED 04/23/2019 1440   LABBENZ NONE DETECTED 04/23/2019 1440   AMPHETMU NONE DETECTED 04/23/2019 1440   THCU NONE DETECTED 04/23/2019 1440   LABBARB NONE DETECTED 04/23/2019 1440    Alcohol Level     Component Value Date/Time   ETH  <10 04/23/2019 2314    IMAGING  DG Chest 1 View 06/17/2019 IMPRESSION:  Enlargement of cardiac silhouette with pulmonary vascular congestion and minimal pulmonary edema.   CT HEAD WO CONTRAST 06/17/2019 IMPRESSION:  Mild atrophy. Extensive supratentorial small vessel disease. 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 And head of caudate nucleus on the right. Suspect more recent and potentially acute focal infarct in the left thalamus. Lacunar type infarcts noted in the cerebellar hemispheres bilaterally, more on the right than on the left. No mass or hemorrhage. Foci of arterial vascular calcification noted at multiple levels. Probable cerumen in each external auditory canal. Deviated nasal septum.   MR BRAIN WO CONTRAST 06/17/2019 IMPRESSION:  1. Truncated exam by patient request.  2. Positive  for a tiny acute cortical infarct in the left occipital lobe, located near a chronic left PCA infarct.  3. Underlying advanced chronic ischemic disease in the bilateral cerebellum and bilateral MCA territories.  ECG - SR rate 94 BPM. (See cardiology reading for complete details)   PHYSICAL EXAM  Temp:  [98 F (36.7 C)-98.8 F (37.1 C)] 98.8 F (37.1 C) (05/23 0805) Pulse Rate:  [60-67] 60 (05/23 0805) Resp:  [15-21] 15 (05/23 1200) BP: (102-164)/(43-75) 102/75 (05/23 1200) SpO2:  [92 %-98 %] 96 % (05/23 0927) FiO2 (%):  [21 %] 21 % (05/23 0927) Weight:  [51.1 kg] 51.1 kg (05/23 0442)  General - Well nourished, well developed, in no apparent distress.  Ophthalmologic - fundi not visualized due to noncooperation.  Cardiovascular - irregularly irregular heart rate and rhythm  Mental Status -  Level of arousal and orientation to time, place, and person were intact. Language including expression, naming, repetition, comprehension was assessed and found intact. Fund of Knowledge  was assessed and was intact.  Cranial Nerves II - XII - II - Visual field intact OU. III, IV, VI - Extraocular movements intact. V - Facial sensation intact bilaterally. VII - Facial movement intact bilaterally. VIII - Hearing & vestibular intact bilaterally. X - Palate elevates symmetrically. XI - Chin turning & shoulder shrug intact bilaterally. XII - Tongue protrusion intact.  Motor Strength - The patient's strength was normal in all extremities and pronator drift was absent.  Bulk was normal and fasciculations were absent.   Motor Tone - Muscle tone was assessed at the neck and appendages and was normal.  Reflexes - The patient's reflexes were symmetrical in all extremities and she had no pathological reflexes.  Sensory - Light touch, temperature/pinprick were assessed and were symmetrical.    Coordination - The patient had normal movements in the hands and feet with no ataxia or dysmetria.  Tremor was absent.  Gait and Station - deferred.   ASSESSMENT/PLAN Ms. AMAURI MEDELLIN is a 69 y.o. 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 She did not receive IV t-PA due to late presentation (>4.5 hours from time of onset) and recent Gi bleed.  Stroke: Punctate acute cortical infarct in the left posterior temporal lobe and questionable right cerebellar punctate infarcts - embolic pattern, due to atrial fibrillation not on AC.  CT head - Prior infarcts at several sites including the left frontal lobe, left posterior  superior parietal lobe, right posteromedial occipital-parietal junction. And head of caudate nucleus on the right. Lacunar type infarcts noted in the cerebellar hemispheres bilaterally, more on the right than on the left. No mass or hemorrhage.    MRI head - Positive for a tiny acute cortical infarct in the left occipital lobe, located near a chronic left PCA infarct. Underlying advanced chronic ischemic disease in the bilateral cerebellum and bilateral MCA territories.  CTA H&N - positive for bulky mural soft plaques versus thrombus throughout aortic arch.  Positive for occluded left P3, likely chronic.  Left CCA 60% stenosis at the level of thyroid.  Moderate bilateral VA origin stenosis, right V4 severe stenosis distal to PICA, bilateral ICA siphon  stenosis, right more than left.  Moderate right P2 stenosis.  2D Echo - pending  Lacey Jensen Virus 2 - negative on 06/14/19  LDL - 31  HgbA1c - 4.9  UDS - pending  VTE prophylaxis - SCDs  aspirin 81 mg daily and clopidogrel 75 mg daily prior to admission, now on aspirin 81 mg daily and clopidogrel 75 mg daily.  Ideally, patient should be on [facility] for A. fib and ?  Aortic arch thrombus, however patient not candidate for AC at this time due to severe GIB requiring blood transfusion.  Antiplatelet regimen as per GI.  Patient counseled to be compliant with her antithrombotic medications  Ongoing aggressive stroke risk factor management  Therapy recommendations: Home health PT OT  Disposition:  Pending  Chronic A. Fib  Not on AC PTA due to history of GI bleeding  This admission also had GI bleeding with symptomatic anemia needed blood transfusion  Not AC candidate at this time  On DAPT PTA, continued on admission  On amiodarone  Continue aspirin and Plavix as cleared by GI  Close follow-up with GI and cardiology for future antithrombotic regimen  Upper GI bleeding  Patient has history of upper GI bleeding on PPI and  sucralfate  This time admitted for abdominal pain and GI bleeding  Hemoglobin 7.1-6.0-8.5-9.0-8.2 with blood transfusion  GI on board  Hypertension  Home BP meds: metoprolol ; amlodipine  Current BP meds: metoprolol  Stable . Permissive hypertension (OK if < 180/105 given GIB) but gradually normalize in 2-3 days  . Long-term BP goal normotensive  Hyperlipidemia  Home Lipid lowering medication: Pravachol 80 mg daily  LDL 31, goal < 70  Current lipid lowering medication: Pravachol 80 mg daily  Continue statin at discharge  Other Stroke Risk Factors  Advanced age  Former cigarette smoker - quit  Previous ETOH use  Coronary artery disease  PVD  Migraines  Substance Abuse - cocaine and marijuana  Other Active Problems  Code status - DNR  ESRD on dialysis, TTS  Hyponatremia - Na 129  Mild thrombocytopenia - [facility] day # 5  Neurology will sign off. Please call with questions. Pt will follow up with stroke clinic NP at [facility] in about 4 weeks. Thanks for the consult.  Rosalin Hawking, MD PhD Stroke Neurology 06/19/2019 4:20 PM   To contact Stroke Continuity provider, please refer to [email or web address] After hours, contact General Neurology

## 2019-06-19 NOTE — TOC Progression Note (Signed)
Transition of Care [facility]) - Progression Note    Patient Details  Name: TAQWA DEEM MRN: [id number] Date of Birth: 10/18/1950  Transition of Care [facility]) CM/SW [place], [place] Phone Number: 06/19/2019, 12:56 PM  Clinical Narrative:     CSW spoke with patients [name] who is agreeable to SNF placement for patient. Patients daughter gave CSW permission to fax out initial referral to [place] area. CSW will need to start insurance authorization closer to patient being medically ready for discharge.  Pending bed offers.CSW will start insurance authorization closer to patient being medically ready for discharge.  CSW will continue to follow.    Expected Discharge Plan: [place] Barriers to Discharge: Continued Medical Work up  Expected Discharge Plan and Services Expected Discharge Plan: [place] In-house Referral: NA Discharge Planning Services: CM Consult Post Acute Care Choice: Home Health(Patient has used [facility] in the past.) Living arrangements for the past 2 months: Single Family Home                                       Social Determinants of Health (SDOH) Interventions    Readmission Risk Interventions Readmission Risk Prevention Plan 06/17/2019 03/29/2019 02/07/2019  Transportation Screening Complete Complete Complete  Medication Review Press photographer) Complete Complete Complete  PCP or Specialist appointment within 3-5 days of discharge - - Complete  HRI or [place] Complete Complete Complete  SW Recovery Care/Counseling Consult Complete Complete Complete  Palliative Care Screening Not Applicable Not Applicable Not [place] (No Data) Not Applicable Not Applicable  Some recent data might be hidden

## 2019-06-20 ENCOUNTER — Inpatient Hospital Stay (HOSPITAL_COMMUNITY): Payer: Medicare Other

## 2019-06-20 DIAGNOSIS — I340 Nonrheumatic mitral (valve) insufficiency: Secondary | ICD-10-CM

## 2019-06-20 DIAGNOSIS — I361 Nonrheumatic tricuspid (valve) insufficiency: Secondary | ICD-10-CM

## 2019-06-20 LAB — ECHOCARDIOGRAM COMPLETE
Height: 62 in
Weight: 1792 oz

## 2019-06-20 MED ORDER — PANTOPRAZOLE SODIUM 40 MG PO TBEC: 40.0000 mg | DELAYED_RELEASE_TABLET | Freq: Two times a day (BID) | ORAL | 6 refills | Status: AC

## 2019-06-20 MED ORDER — AMIODARONE HCL 200 MG PO TABS: 200.0000 mg | ORAL_TABLET | Freq: Two times a day (BID) | ORAL | 4 refills | Status: AC

## 2019-06-20 MED ORDER — METOPROLOL TARTRATE 50 MG PO TABS
25.0000 mg | ORAL_TABLET | Freq: Two times a day (BID) | ORAL | 11 refills | Status: DC
Start: 2019-06-20 — End: 2019-07-14

## 2019-06-20 NOTE — Progress Notes (Signed)
  Echocardiogram 2D Echocardiogram has been attempted. Patient wanting to eat breakfast. Will reattempt at later time.  Randa Lynn Hailly Fess 06/20/2019, 8:46 AM

## 2019-06-20 NOTE — TOC Transition Note (Signed)
Transition of Care [facility]) - CM/SW Discharge Note   Patient Details  Name: Christina [name] MRN: [id number] Date of Birth: 1950-03-26  Transition of Care [facility]) CM/SW Contact:  Bethena Roys, RN Phone Number: 06/20/2019, 5:03 PM   Clinical Narrative:  Patient has declined [place]- daughter is aware that patient will return home today. Daughter to provide patient transportation home via private vehicle. Case Manager previously offered choice regarding home health services. [facility] first choice- unable to staff, [facility]- unable to staff. [facility]- able to staff and start of care to begin Wednesday. Patient will get PT/OT Services via [facility] and they will call with visit time. No further needs from Case Manager.   Final next level of care: [place] Barriers to Discharge: Continued Medical Work up  Patient Goals and CMS Choice Patient states their goals for this hospitalization and ongoing recovery are:: to return home with home health CMS Medicare.gov Compare Post Acute Care list provided to:: Patient Represenative (must comment)(Michelle) Choice offered to / list presented to : Adult Children  Discharge Plan and Services In-house Referral: NA Discharge Planning Services: CM Consult Post Acute Care Choice: Home Health            HH Arranged: PT-OT [place] Date [place]: 06/20/19 Time [place]: [id number] Representative spoke with at [place]: Cassie  Readmission Risk Interventions Readmission Risk Prevention Plan 06/17/2019 03/29/2019 02/07/2019  Transportation Screening Complete Complete Complete  Medication Review Press photographer) Complete Complete Complete  PCP or Specialist appointment within 3-5 days of discharge - - Complete  HRI or [place] Complete Complete Complete  SW Recovery Care/Counseling Consult Complete Complete Complete  Palliative Care Screening Not  Applicable Not Applicable Not [place] (No Data) Not Applicable Not Applicable  Some recent data might be hidden

## 2019-06-20 NOTE — Discharge Summary (Signed)
Physician Discharge Summary  Christina [name] NAT:[id number] DOB: [birth year] DOA: 06/14/2019  PCP: Maggie Schwalbe, PA-C  Admit date: 06/14/2019 Discharge date: 06/20/2019  Admitted From: Home Disposition: Home Recommendations for Outpatient Follow-up:  1. Follow up with PCP in 1-2 weeks, please make sure beta-blocker has been tapered since she has been started on amiodarone to avoid bradycardia. 2. Please obtain BMP/CBC in one week Please follow up with nephrologist and [place] dialysis Home Health: Yes Equipment/Devices: None Discharge Condition: Stable and improved  CODE STATUS: DNR  diet recommendation: Cardiac Brief/Interim Summary:69 y.o.femalewith medical history significant ofSBO; HTN; HLD; depression; CAD with stents; COPD; ESRD on TTS HD; and afib presenting withhematemesis. I admitted her for abdominal pain on 1/25; this was thought to be related to missed HD and she was discharged on 1/27. She returned for abdominal pain from 1/31-2/2, again after missing HD. She had a subsequent ER visit and then hospitalization and presented again from 2/26-3/3 with abdominal pain and ABLA in the setting of cocaine use. EGD was performed and showed esophagitis, gastritis, and multiple duodenal ulcers. She was started on PPI and Carafate. She returned again on 3/8 with concern for hematemesis (hemoccult negative). 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 She also had afib with RVR. She was hospitalized again from 4/13-15 with acute respiratory failure from volume overload vs. COPD exacerbation. She was hospitalized at [facility] from 4/25-27 with afib with RVR; no AC due to large duodenal ulcer, discharged on metoprolol and Cardizem. Finally, she was hospitalized at [facility] from 5/8-9 with PAD and dry gangrene of a left foot digit; after  discussion, the plan was for d/c with outpatient vascular follow up.  She reports hematemesis and large bloody BM at home this AM. She is having significant abdominal pain as well as LLE pain associated with dry gangrene. Upon arriving to the floor, she had a large bloody BM and was noted to have rectal prolapse. She reports that this happens frequently at home.   ED Course:Woke up this AM with coffee ground emesis and then had a bloody BM. Hgb 10 ->7.1. Abdominal pain out of proportion to exam - thickening of bowel wall. Transfusing. GI to follow. Now in afib with RVR. Discharge Diagnoses:  Principal Problem:   Acute GI bleeding Active Problems:   ESRD (end stage renal disease) (HCC)   CAD (coronary artery disease), native coronary artery   COPD with chronic bronchitis (HCC)   Essential hypertension   Hyperlipidemia   Cocaine abuse ([place])   Superior mesenteric artery atherosclerosis (HCC)   Atrial fibrillation with rapid ventricular response ([place])   Incomplete rectal prolapse   Cerebral embolism with cerebral infarction   #1 GI bleed--Status post EGD 06/16/2019 with multiple deep duodenal ulcers noted. These ulcers appeared very similar to the EGD findings from February 2021. Biopsy not done this time as the biopsies done in February showed no signs of cancer or infection.She needs to continue Protonix 40 mg twice a day indefinitely.  Will discharge her today on Protonix and iron supplementation.  Patient received a dose of Feraheme on 06/16/2019.  She is status post a high-grade SMA stenosis and stent placement. Vascular surgery was consulted for concern for ischemic bowel-status post SMA stent CT angiogram consistent with patent stent.He recommended to continue follow-up with Dr. Carlis Abbott for a mesenteric duplex.  #2 A. fib RVR not on anticoagulation  due to GI bleeding history.Patient was started on amiodarone she will be discharged on amiodarone 200 twice a day.  She  will also be on tapering dose of Lopressor.  So please make sure as an outpatient taper the beta-blocker to avoid bradycardia.     #3 ESRD on dialysis Tuesday Thursday and Saturdays with a left upper arm fistula with a right IJ dialysis catheter in place  #4 peripheral vascular disease follow-up with vascular surgery as an outpatient.Restart aspirin and Plavix    #5 chronic rectal prolapse patient does not want surgery done  #6 history of essential hypertension-blood pressure 180/66 102/75 continue current medications   #7 history of COPD stable  #8 history of hyperlipidemia continue statin  #9 history of cocaine abuse toc consulted for resources and counseling.  #10 hyperkalemia patient on dialysispotassium 4.2  #11 stroke -MRI tiny acute cortical infarct in the left occipital lobe located near a chronic left PCA infarct.advanced chronic ischemic disease in the bilateral cerebellum and bilateral territories Neurology was consulted.  Their recommendation was anticoagulation for atrial fibrillation and aortic arch thrombus however because of recurrent severe GI bleeding anticoagulation was not started.  She is on aspirin and Plavix. Hemoglobin A1c is 4.9. LDL 31 total cholesterol 86 triglycerides 92 CT angiogram of the head and neck -negative for large vessel occlusion, positive for bulky mural soft plaque versus thrombus throughout the aortic arch, positive for occluded left PCA superior P3 branch positive for significant atherosclerotic stenosis left CCA 60% stenosis at the level of the thyroid moderate bilateral vertebral artery origin stenosis, severe right vertebral V4 segment stenosis, bilateral ICA stenosis moderate on the left moderate to severe on the right and moderate right PCA P2 stenosis Echo -Left ventricular ejection fraction, by estimation, is 65 to 70%. The  left ventricle has normal function. The left ventricle has no regional  wall motion abnormalities. Left  ventricular diastolic parameters are  consistent with Grade II diastolic  dysfunction (pseudonormalization).  Right ventricular systolic function is normal. The right ventricular  size is normal. There is moderately elevated pulmonary artery systolic  pressure. The estimated right ventricular systolic pressure is 40.3 mmHg.   Left atrial size was mild to moderately dilated.   Right atrial size was mildly dilated.   The mitral valve is normal in structure. Moderate mitral valve  regurgitation. No evidence of mitral stenosis.   Tricuspid valve regurgitation is moderate.   The aortic valve is normal in structure. Aortic valve regurgitation is  not visualized. No aortic stenosis is present  Estimated body mass index is 20.49 kg/m as calculated from the following:   Height as of this encounter: 5\' 2"  (1.575 m).   Weight as of this encounter: 50.8 kg.  Discharge Instructions  Discharge Instructions    Amb referral to AFIB Clinic   Complete by: As directed    Ambulatory referral to Neurology   Complete by: As directed    Follow up with stroke clinic NP (Jessica Vanschaick or Cecille Rubin, if both not available, consider Zachery Dauer, or A[place]) at [facility] in about 4 weeks. Thanks.     Allergies as of 06/20/2019      Reactions   Betaine Anaphylaxis   Cyclobenzaprine Anaphylaxis, Other (See Comments)   "stopped heart"   Morphine Anaphylaxis   "stopped heart"   Penicillins Shortness Of Breath, Swelling, Palpitations   Did it involve swelling of the face/tongue/throat, SOB, or low BP? Yes Did it involve sudden or severe rash/hives, skin peeling, or  any reaction on the inside of your mouth or nose? No Did you need to seek medical attention at a hospital or doctor's office? No When did it last happen?years  If all above answers are "NO", may proceed with cephalosporin use.   Ambien [zolpidem] Other (See Comments)   Severe confusion   Codeine Itching, Rash   Hydromorphone  Other (See Comments)   If administered quickly, felt like hand was "exploding"       Medication List    STOP taking these medications   amLODipine 10 MG tablet Commonly known as: NORVASC   furosemide 40 MG tablet Commonly known as: LASIX   gabapentin 300 MG capsule Commonly known as: NEURONTIN     TAKE these medications   albuterol (2.5 MG/3ML) 0.083% nebulizer solution Commonly known as: PROVENTIL Take 2.5 mg by nebulization every 6 (six) hours as needed for wheezing or shortness of breath.   allopurinol 100 MG tablet Commonly known as: ZYLOPRIM Take 100 mg by mouth daily.   amiodarone 200 MG tablet Commonly known as: PACERONE Take 1 tablet (200 mg total) by mouth 2 (two) times daily.   aspirin 81 MG EC tablet Take 81 mg by mouth daily.   Auryxia 1 GM 210 MG(Fe) tablet Generic drug: ferric citrate Take 630 mg by mouth 3 (three) times daily with meals. 2 tablets with a snack = 420mg    budesonide-formoterol 160-4.5 MCG/ACT inhaler Commonly known as: SYMBICORT Inhale 2 puffs into the lungs every 4 (four) hours as needed (shortness of breath/wheezing).   clobetasol 0.05 % external solution Commonly known as: TEMOVATE Apply 1 application topically daily as needed (psoriasis).   clopidogrel 75 MG tablet Commonly known as: PLAVIX Take 75 mg by mouth daily.   dicyclomine 10 MG capsule Commonly known as: BENTYL Take 10 mg by mouth at bedtime.   diphenhydrAMINE 25 MG tablet Commonly known as: BENADRYL Take 25 mg by mouth at bedtime as needed for sleep.   docusate sodium 100 MG capsule Commonly known as: COLACE Take 1 capsule (100 mg total) by mouth 2 (two) times daily. What changed:   how much to take  when to take this  reasons to take this   Ipratropium-Albuterol 20-100 MCG/ACT Aers respimat Commonly known as: COMBIVENT Inhale 2 puffs into the lungs 4 (four) times daily as needed for wheezing.   isosorbide mononitrate 30 MG 24 hr tablet Commonly known  as: IMDUR Take 30 mg by mouth at bedtime.   lidocaine-prilocaine cream Commonly known as: EMLA Apply 1 application topically daily as needed (pain at the site of port access).   metoprolol tartrate 50 MG tablet Commonly known as: LOPRESSOR Take 0.5 tablets (25 mg total) by mouth 2 (two) times daily. What changed:   medication strength  how much to take   nitroGLYCERIN 0.4 MG SL tablet Commonly known as: NITROSTAT Place 0.4 mg under the tongue every 5 (five) minutes as needed for chest pain.   ondansetron 4 MG tablet Commonly known as: ZOFRAN Take 4 mg by mouth 3 (three) times daily as needed for nausea.   pantoprazole 40 MG tablet Commonly known as: PROTONIX Take 1 tablet (40 mg total) by mouth 2 (two) times daily.   polyethylene glycol 17 g packet Commonly known as: MIRALAX / GLYCOLAX Take 17 g by mouth daily as needed for mild constipation. What changed: when to take this   pravastatin 80 MG tablet Commonly known as: PRAVACHOL Take 80 mg by mouth every evening.   sennosides-docusate sodium  8.6-50 MG tablet Commonly known as: SENOKOT-S Take 1 tablet by mouth as needed for constipation.   sucralfate 1 GM/10ML suspension Commonly known as: CARAFATE Take 10 mLs (1 g total) by mouth every 6 (six) hours. What changed: Another medication with the same name was removed. Continue taking this medication, and follow the directions you see here.   Vitamin D3 50 MCG (2000 UT) capsule Take 2,000 Units by mouth daily.      Follow-up Information    [facility]. Schedule an appointment as soon as possible for a visit in 4 week(s).   Specialty: Neurology Contact information: [place] [phone]       Maggie Schwalbe, PA-C Follow up.   Specialty: Physician Assistant Contact information: [place] [id number] [phone]          Allergies  Allergen Reactions   . Betaine Anaphylaxis  . Cyclobenzaprine Anaphylaxis and Other (See Comments)    "stopped heart"   . Morphine Anaphylaxis    "stopped heart"  . Penicillins Shortness Of Breath, Swelling and Palpitations    Did it involve swelling of the face/tongue/throat, SOB, or low BP? Yes Did it involve sudden or severe rash/hives, skin peeling, or any reaction on the inside of your mouth or nose? No Did you need to seek medical attention at a hospital or doctor's office? No When did it last happen?years  If all above answers are "NO", may proceed with cephalosporin use.   . Ambien [Zolpidem] Other (See Comments)    Severe confusion  . Codeine Itching and Rash  . Hydromorphone Other (See Comments)    If administered quickly, felt like hand was "exploding"     Consultations: Nephrology, neurology, GI  Procedures/Studies: CT ANGIO HEAD W OR WO CONTRAST  Result Date: 06/19/2019 CLINICAL DATA:  70 year old female dialysis patient with encephalopathy, confusion after dialysis. Recent MRI demonstrating a small chronic left occipital cortical infarct, nearby chronic left occipital pole encephalomalacia, underlying advanced chronic ischemic disease. EXAM: CT ANGIOGRAPHY HEAD AND NECK TECHNIQUE: Multidetector CT imaging of the head and neck was performed using the standard protocol during bolus administration of intravenous contrast. Multiplanar CT image reconstructions and MIPs were obtained to evaluate the vascular anatomy. Carotid stenosis measurements (when applicable) are obtained utilizing NASCET criteria, using the distal internal carotid diameter as the denominator. CONTRAST:  25mL OMNIPAQUE IOHEXOL 350 MG/ML SOLN COMPARISON:  Brain MRI and head CT 06/17/2019. Neck CT without contrast 07/11/2016 FINDINGS: CT HEAD Brain: Subtle cortical hypodensity now on the left lateral occiput corresponding to the DWI lesion (series 5, image 14). No acute intracranial hemorrhage or mass effect. Otherwise Stable  non contrast CT appearance of the brain. Calvarium and skull base: No acute osseous abnormality identified. Paranasal sinuses: Visualized paranasal sinuses and mastoids are stable and well pneumatized. Orbits: Stable orbit and scalp soft tissues. CTA NECK Skeleton: Absent maxillary dentition. Advanced cervical spine degeneration with reversal of lordosis. Chronic deformity posterior right 1st and 2nd ribs. No acute osseous abnormality identified. Upper chest: Small layering pleural effusions are partially visible. Mild mosaic pulmonary attenuation. Visible precarinal and AP window lymph nodes are at the upper limits of normal. Right IJ approach dual lumen dialysis catheter. Other neck: No acute findings.  No cervical lymphadenopathy. Aortic arch: Severe atherosclerosis of the visible aorta including bulky mural plaque or thrombus throughout the arch (series 12, images 80 through 134). Three vessel arch configuration. Right carotid system: No  brachiocephalic artery or right CCA origin stenosis despite plaque. Tortuous proximal right CCA. Intermittent plaque to the bifurcation without stenosis. Less than 50 % stenosis with respect to the distal vessel of the proximal right ICA despite plaque. Left carotid system: Prominent plaque at the left CCA origin and along the ventral vessel at the thoracic inlet but less than 50% stenosis (series 9, image 132). At the level of the thyroid combined soft and calcified plaque results in 60% stenosis (series 9, image 122). Capacious left carotid bifurcation with surgical clips compatible with previous endarterectomy. Tortuous left ICA distal to the bulb without stenosis to the skull base. Vertebral arteries: Proximal right subclavian soft and calcified plaque similar to the other great vessels. Moderate stenosis right vertebral artery origin best seen on series 12. Tortuous right V1 segment. No additional right vertebral stenosis to the skull base. Widespread proximal left  subclavian artery plaque similar to the other great vessels without significant stenosis. Calcified plaque at the left vertebral artery origin, moderate stenosis best seen on series 11, image 262. Tortuous left V1 segment. Codominant left vertebral artery otherwise patent to the skull base. CTA HEAD Posterior circulation: Bilateral V4 segment soft and calcified plaque. No significant stenosis on the left but moderate to severe right V4 stenosis just distal to PICA on series 12, image 113. Normal left PICA. Patent vertebrobasilar junction. Patent basilar artery with only mild irregularity. Normal SCA and PCA origins. Posterior communicating arteries are diminutive or absent. The left PCA P3 superior division is occluded, although there is chronic encephalomalacia in the left PCA territory also. On the right there is mild to moderate P1 and P2 irregularity and stenosis. Anterior circulation: Both ICA siphons are patent. Heavy calcification through the left cavernous segment and anterior genu with mild to moderate stenosis. Similar right siphon calcified plaque with at least moderate stenosis at the anterior genu. Patent carotid termini. Normal MCA and ACA origins. Anterior communicating artery and bilateral ACA branches are within normal limits. Left MCA M1 segment and bifurcation are patent without stenosis. Right MCA M1 segment and bifurcation are patent without stenosis. Bilateral MCA branches are within normal limits. Venous sinuses: Early contrast timing but grossly patent. Anatomic variants: None. Review of the MIP images confirms the above findings IMPRESSION: 1. Negative for large vessel occlusion. 2. Positive for bulky Mural Soft Plaque versus Thrombus throughout the Aortic Arch. See series 12 images 80-134. Aortic Atherosclerosis (ICD10-I70.0). 3. Positive for occluded Left PCA superior P3 branch, although age indeterminate in light of some chronic left PCA territory encephalomalacia. 4. Positive also for  significant atherosclerotic stenoses: - Left CCA 60% stenosis at the level of the thyroid. - moderate bilateral vertebral artery origin stenosis, and up to Severe Right Vertebral V4 segment stenosis distal to PICA. - bilateral ICA siphon stenosis, up to moderate on the Left and Moderate To Severe at the Right ICA anterior genu. - moderate Right PCA P2 stenosis. 5.  Stable non contrast CT appearance of the brain. 6. Small bilateral pleural effusions. Advanced cervical spine degeneration. Electronically Signed   By: Genevie Ann M.D.   On: 06/19/2019 13:25   DG Chest 1 View  Result Date: 06/17/2019 CLINICAL DATA:  Hematemesis, history small bowel obstruction, hypertension, coronary artery disease post stenting, COPD, end-stage renal disease on hemodialysis, anemia secondary to GI bleed EXAM: CHEST  1 VIEW COMPARISON:  Portable exam 1506 hours compared to 06/14/2019 FINDINGS: RIGHT jugulars dialysis catheter with tip projecting over RIGHT atrium. Enlargement of cardiac silhouette with  slight pulmonary vascular congestion. Atherosclerotic calcification aorta. Minimal perihilar to basilar pulmonary infiltrates consistent with pulmonary edema. No pleural effusion or pneumothorax. Bones demineralized. IMPRESSION: Enlargement of cardiac silhouette with pulmonary vascular congestion and minimal pulmonary edema. Electronically Signed   By: Lavonia Dana M.D.   On: 06/17/2019 15:26   CT HEAD WO CONTRAST  Result Date: 06/17/2019 CLINICAL DATA:  Delirium/confusion after dialysis EXAM: CT HEAD WITHOUT CONTRAST TECHNIQUE: Contiguous axial images were obtained from the base of the skull through the vertex without intravenous contrast. COMPARISON:  July 17, 2018 maxillofacial CT covering much of the brain parenchyma FINDINGS: Brain: There is mild diffuse atrophy. There is no intracranial mass, hemorrhage, extra-axial fluid collection, or midline shift. There is widespread small vessel disease throughout the centra semiovale  bilaterally. There is a prior appearing infarct in the superior mid left frontal lobe with involvement of both gray and white matter in this area. There is a prior appearing infarct in the posterosuperior left parietal lobe at the gray-white junction. There is evidence of a prior infarct in the medial right parietooccipital junction region. There is a prior infarct in the head of the caudate nucleus on the right. There is an age uncertain infarct in the left thalamus which has somewhat indistinct borders suggesting that this infarct may be recent/acute. There are small lacunar type infarcts in each cerebellar hemisphere, more on the right than on the left. Vascular: No hyperdense vessel. There are foci of calcification in each carotid siphon and distal vertebral artery region. Skull: The bony calvarium a appears intact. Sinuses/Orbits: Paranasal sinuses are clear. There is rightward deviation of the nasal septum. Orbits appear symmetric bilaterally. Other: Mastoid air cells are clear. There is debris in each external auditory canal. Brain: It IMPRESSION: Mild atrophy. Extensive supratentorial small vessel disease. 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 And head of caudate nucleus on the right. Suspect more recent and potentially acute focal infarct in the left thalamus. Lacunar type infarcts noted in the cerebellar hemispheres bilaterally, more on the right than on the left. No mass or hemorrhage. Foci of arterial vascular calcification noted at multiple levels. Probable cerumen in each external auditory canal. Deviated nasal septum. Electronically Signed   By: Lowella Grip III M.D.   On: 06/17/2019 16:19   CT ANGIO NECK W OR WO CONTRAST  Result Date: 06/19/2019 CLINICAL DATA:  69 year old female dialysis patient with encephalopathy, confusion after dialysis. Recent MRI demonstrating a small  chronic left occipital cortical infarct, nearby chronic left occipital pole encephalomalacia, underlying advanced chronic ischemic disease. EXAM: CT ANGIOGRAPHY HEAD AND NECK TECHNIQUE: Multidetector CT imaging of the head and neck was performed using the standard protocol during bolus administration of intravenous contrast. Multiplanar CT image reconstructions and MIPs were obtained to evaluate the vascular anatomy. Carotid stenosis measurements (when applicable) are obtained utilizing NASCET criteria, using the distal internal carotid diameter as the denominator. CONTRAST:  70mL OMNIPAQUE IOHEXOL 350 MG/ML SOLN COMPARISON:  Brain MRI and head CT 06/17/2019. Neck CT without contrast 07/11/2016 FINDINGS: CT HEAD Brain: Subtle cortical hypodensity now on the left lateral occiput corresponding to the DWI lesion (series 5, image 14). No acute intracranial hemorrhage or mass effect. Otherwise Stable non contrast CT appearance of the brain. Calvarium and skull base: No acute osseous abnormality identified. Paranasal sinuses: Visualized paranasal sinuses and mastoids are stable and well pneumatized. Orbits: Stable orbit and scalp soft tissues. CTA NECK Skeleton: Absent  maxillary dentition. Advanced cervical spine degeneration with reversal of lordosis. Chronic deformity posterior right 1st and 2nd ribs. No acute osseous abnormality identified. Upper chest: Small layering pleural effusions are partially visible. Mild mosaic pulmonary attenuation. Visible precarinal and AP window lymph nodes are at the upper limits of normal. Right IJ approach dual lumen dialysis catheter. Other neck: No acute findings.  No cervical lymphadenopathy. Aortic arch: Severe atherosclerosis of the visible aorta including bulky mural plaque or thrombus throughout the arch (series 12, images 80 through 134). Three vessel arch configuration. Right carotid system: No brachiocephalic artery or right CCA origin stenosis despite plaque. Tortuous  proximal right CCA. Intermittent plaque to the bifurcation without stenosis. Less than 50 % stenosis with respect to the distal vessel of the proximal right ICA despite plaque. Left carotid system: Prominent plaque at the left CCA origin and along the ventral vessel at the thoracic inlet but less than 50% stenosis (series 9, image 132). At the level of the thyroid combined soft and calcified plaque results in 60% stenosis (series 9, image 122). Capacious left carotid bifurcation with surgical clips compatible with previous endarterectomy. Tortuous left ICA distal to the bulb without stenosis to the skull base. Vertebral arteries: Proximal right subclavian soft and calcified plaque similar to the other great vessels. Moderate stenosis right vertebral artery origin best seen on series 12. Tortuous right V1 segment. No additional right vertebral stenosis to the skull base. Widespread proximal left subclavian artery plaque similar to the other great vessels without significant stenosis. Calcified plaque at the left vertebral artery origin, moderate stenosis best seen on series 11, image 262. Tortuous left V1 segment. Codominant left vertebral artery otherwise patent to the skull base. CTA HEAD Posterior circulation: Bilateral V4 segment soft and calcified plaque. No significant stenosis on the left but moderate to severe right V4 stenosis just distal to PICA on series 12, image 113. Normal left PICA. Patent vertebrobasilar junction. Patent basilar artery with only mild irregularity. Normal SCA and PCA origins. Posterior communicating arteries are diminutive or absent. The left PCA P3 superior division is occluded, although there is chronic encephalomalacia in the left PCA territory also. On the right there is mild to moderate P1 and P2 irregularity and stenosis. Anterior circulation: Both ICA siphons are patent. Heavy calcification through the left cavernous segment and anterior genu with mild to moderate stenosis.  Similar right siphon calcified plaque with at least moderate stenosis at the anterior genu. Patent carotid termini. Normal MCA and ACA origins. Anterior communicating artery and bilateral ACA branches are within normal limits. Left MCA M1 segment and bifurcation are patent without stenosis. Right MCA M1 segment and bifurcation are patent without stenosis. Bilateral MCA branches are within normal limits. Venous sinuses: Early contrast timing but grossly patent. Anatomic variants: None. Review of the MIP images confirms the above findings IMPRESSION: 1. Negative for large vessel occlusion. 2. Positive for bulky Mural Soft Plaque versus Thrombus throughout the Aortic Arch. See series 12 images 80-134. Aortic Atherosclerosis (ICD10-I70.0). 3. Positive for occluded Left PCA superior P3 branch, although age indeterminate in light of some chronic left PCA territory encephalomalacia. 4. Positive also for significant atherosclerotic stenoses: - Left CCA 60% stenosis at the level of the thyroid. - moderate bilateral vertebral artery origin stenosis, and up to Severe Right Vertebral V4 segment stenosis distal to PICA. - bilateral ICA siphon stenosis, up to moderate on the Left and Moderate To Severe at the Right ICA anterior genu. - moderate Right PCA P2 stenosis. 5.  Stable non contrast CT appearance of the brain. 6. Small bilateral pleural effusions. Advanced cervical spine degeneration. Electronically Signed   By: Genevie Ann M.D.   On: 06/19/2019 13:25   MR BRAIN WO CONTRAST  Result Date: 06/17/2019 CLINICAL DATA:  69 year old female with encephalopathy. Confusion after dialysis. EXAM: MRI HEAD WITHOUT CONTRAST TECHNIQUE: Multiplanar, multiecho pulse sequences of the brain and surrounding structures were obtained without intravenous contrast. COMPARISON:  Head CT without contrast earlier today. FINDINGS: The examination had to be discontinued prior to completion by patient request. Diffusion-weighted imaging, axial T2  imaging, and moderately degraded sagittal T1 imaging only was obtained. Major intracranial vascular flow voids are preserved. There is a tiny focus of restricted diffusion in the cortex of the left lateral occipital lobe. No other convincing restricted diffusion. See series 5, image 69. Nearby chronic cortical encephalomalacia in the left occipital pole. There are scattered small chronic infarcts in both cerebellar hemispheres, in the bilateral basal ganglia. There are chronic cortical infarcts in the bilateral MCA territories, and right posterior MCA/PCA watershed with cortical encephalomalacia. Superimposed confluent bilateral cerebral white matter T2 hyperintensity. No midline shift, mass effect, evidence of mass lesion, ventriculomegaly, extra-axial collection or acute intracranial hemorrhage is evident. Paranasal Visualized paranasal sinuses and mastoids are stable and well pneumatized. Visible internal auditory structures appear normal. Mildly asymmetric size of the right superior ophthalmic vein, but otherwise negative orbits. Unremarkable noncontrast visible cavernous sinus. IMPRESSION: 1. Truncated exam by patient request. 2. Positive for a tiny acute cortical infarct in the left occipital lobe, located near a chronic left PCA infarct. 3. Underlying advanced chronic ischemic disease in the bilateral cerebellum and bilateral MCA territories. Electronically Signed   By: Genevie Ann M.D.   On: 06/17/2019 19:10   CT ABDOMEN PELVIS W CONTRAST  Result Date: 06/14/2019 CLINICAL DATA:  Vomiting and diarrhea beginning this morning. Abdominal pain and tenderness with palpation. EXAM: CT ABDOMEN AND PELVIS WITH CONTRAST TECHNIQUE: Multidetector CT imaging of the abdomen and pelvis was performed using the standard protocol following bolus administration of intravenous contrast. CONTRAST:  61mL OMNIPAQUE IOHEXOL 300 MG/ML  SOLN COMPARISON:  04/23/2019 FINDINGS: Lower Chest: New small bilateral pleural effusions and  dependent bibasilar atelectasis. Stable cardiomegaly. Hepatobiliary: No hepatic masses identified. Gallbladder is unremarkable. No evidence of biliary ductal dilatation. Pancreas:  No mass or inflammatory changes. Spleen: Within normal limits in size and appearance. Adrenals/Urinary Tract: Stable bilateral adrenal masses, consistent with benign adenomas. Diffuse bilateral renal atrophy and tiny bilateral renal cysts remain stable. No evidence of ureteral calculi or hydronephrosis. Unremarkable unopacified urinary bladder. Stomach/Bowel: Diffuse duodenal wall thickening and mucosal enhancement has increased since previous study, consistent with duodenitis. A small postbulbar duodenal ulcer is seen and decreased in size since previous study. No evidence of free intraperitoneal air. Vascular/Lymphatic: No pathologically enlarged lymph nodes. No abdominal aortic aneurysm. Aortic atherosclerosis noted. Reproductive: Prior hysterectomy noted. Adnexal regions are unremarkable in appearance. Other: Several small midline epigastric ventral hernias are again seen which contain only fat. No evidence of herniated bowel loops. Increased diffuse body wall edema since prior exam. Musculoskeletal:  No suspicious bone lesions identified. IMPRESSION: 1. Increased diffuse duodenal wall thickening and mucosal enhancement, consistent with duodenitis. 2. Decreased size of small postbulbar duodenal ulcer. 3. New small bilateral pleural effusions diffuse body wall edema. 4. Stable small epigastric ventral hernias containing only fat. 5. Stable benign bilateral adrenal adenomas. Aortic Atherosclerosis (ICD10-I70.0). Electronically Signed   By: Marlaine Hind M.D.   On: 06/14/2019 15:21  DG CHEST PORT 1 VIEW  Result Date: 06/19/2019 CLINICAL DATA:  End-stage renal disease.  Dialysis. EXAM: PORTABLE CHEST 1 VIEW COMPARISON:  Jun 17, 2019 FINDINGS: The dialysis catheter terminates near the caval atrial junction. No pneumothorax. No overt  edema. No nodules or masses. No focal infiltrates. There may be a small left effusion. The cardiomediastinal silhouette is stable. IMPRESSION: No overt edema. Stable dialysis catheter. There may be a small left effusion. Electronically Signed   By: Dorise Bullion III M.D   On: 06/19/2019 13:30   DG Chest Port 1 View  Result Date: 06/14/2019 CLINICAL DATA:  Weakness. Dialysis patient. EXAM: PORTABLE CHEST 1 VIEW COMPARISON:  Chest x-ray 05/22/2019 FINDINGS: The right IJ dialysis catheter is in good position, unchanged. The cardiac silhouette, mediastinal and hilar contours are within normal limits. Mild tortuosity and calcification of the thoracic aorta. No acute pulmonary findings. No pleural effusions. No pulmonary lesions. IMPRESSION: No acute cardiopulmonary findings. Electronically Signed   By: Marijo Sanes M.D.   On: 06/14/2019 10:49   ECHOCARDIOGRAM COMPLETE  Result Date: 06/20/2019    ECHOCARDIOGRAM REPORT   Patient Name:   SUHAYLA CHISOM Date of Exam: 06/20/2019 Medical Rec #:  [id number]          Height:       62.0 in Accession #:    [id number]         Weight:       112.0 lb Date of Birth:  [birth year]           BSA:          1.494 m Patient Age:    69 years           BP:           157/59 mmHg Patient Gender: F                  HR:           82 bpm. Exam Location:  Inpatient Procedure: 2D Echo, Color Doppler and Cardiac Doppler Indications:    Stroke 434.91/ I163.9  History:        Patient has prior history of Echocardiogram examinations, most                 recent 01/25/2019. CAD, COPD, Arrythmias:Atrial Fibrillation;                 Risk Factors:Hypertension, Dyslipidemia and GERD. CKD.  Sonographer:    Jonelle Sidle Dance Referring Phys: [id number] ASHISH ARORA IMPRESSIONS  1. Left ventricular ejection fraction, by estimation, is 65 to 70%. The left ventricle has normal function. The left ventricle has no regional wall motion abnormalities. Left ventricular diastolic parameters are consistent with  Grade II diastolic dysfunction (pseudonormalization).  2. Right ventricular systolic function is normal. The right ventricular size is normal. There is moderately elevated pulmonary artery systolic pressure. The estimated right ventricular systolic pressure is 99.3 mmHg.  3. Left atrial size was mild to moderately dilated.  4. Right atrial size was mildly dilated.  5. The mitral valve is normal in structure. Moderate mitral valve regurgitation. No evidence of mitral stenosis.  6. Tricuspid valve regurgitation is moderate.  7. The aortic valve is normal in structure. Aortic valve regurgitation is not visualized. No aortic stenosis is present. FINDINGS  Left Ventricle: Left ventricular ejection fraction, by estimation, is 65 to 70%. The left ventricle has normal function. The left ventricle has no regional wall motion abnormalities. The left ventricular internal cavity  size was normal in size. There is  no left ventricular hypertrophy. Left ventricular diastolic parameters are consistent with Grade II diastolic dysfunction (pseudonormalization). Right Ventricle: The right ventricular size is normal. No increase in right ventricular wall thickness. Right ventricular systolic function is normal. There is moderately elevated pulmonary artery systolic pressure. The tricuspid regurgitant velocity is 3.35 m/s, and with an assumed right atrial pressure of 8 mmHg, the estimated right ventricular systolic pressure is 67.3 mmHg. Left Atrium: Left atrial size was mild to moderately dilated. Right Atrium: Right atrial size was mildly dilated. Pericardium: There is no evidence of pericardial effusion. Mitral Valve: The mitral valve is normal in structure. Moderate mitral valve regurgitation. No evidence of mitral valve stenosis. Tricuspid Valve: The tricuspid valve is grossly normal. Tricuspid valve regurgitation is moderate . No evidence of tricuspid stenosis. Aortic Valve: The aortic valve is normal in structure. Aortic valve  regurgitation is not visualized. Aortic regurgitation PHT measures 378 msec. No aortic stenosis is present. Pulmonic Valve: The pulmonic valve was normal in structure. Pulmonic valve regurgitation is trivial. Aorta: The aortic root and ascending aorta are structurally normal, with no evidence of dilitation. IAS/Shunts: The atrial septum is grossly normal.  LEFT VENTRICLE PLAX 2D LVIDd:         4.00 cm  Diastology LVIDs:         2.36 cm  LV e' lateral:   6.90 cm/s LV PW:         0.96 cm  LV E/e' lateral: 21.7 LV IVS:        0.91 cm  LV e' medial:    5.10 cm/s LVOT diam:     1.70 cm  LV E/e' medial:  29.4 LV SV:         45 LV SV Index:   30 LVOT Area:     2.27 cm  RIGHT VENTRICLE             IVC RV Basal diam:  3.02 cm     IVC diam: 2.27 cm RV Mid diam:    1.83 cm RV S prime:     10.80 cm/s TAPSE (M-mode): 1.8 cm LEFT ATRIUM             Index       RIGHT ATRIUM           Index LA diam:        3.80 cm 2.54 cm/m  RA Area:     19.50 cm LA Vol (A2C):   74.0 ml 49.53 ml/m RA Volume:   54.70 ml  36.61 ml/m LA Vol (A4C):   54.7 ml 36.61 ml/m LA Biplane Vol: 64.4 ml 43.10 ml/m  AORTIC VALVE LVOT Vmax:   93.80 cm/s LVOT Vmean:  61.700 cm/s LVOT VTI:    0.199 m AI PHT:      378 msec  AORTA Ao Root diam: 3.00 cm Ao Asc diam:  2.70 cm MITRAL VALVE                 TRICUSPID VALVE MV Area (PHT): 2.76 cm      TR Peak grad:   44.9 mmHg MV Decel Time: 275 msec      TR Vmax:        335.00 cm/s MR Peak grad:    126.8 mmHg MR Mean grad:    86.0 mmHg   SHUNTS MR Vmax:         563.00 cm/s Systemic VTI:  0.20 m MR Vmean:  437.0 cm/s  Systemic Diam: 1.70 cm MR PISA:         0.57 cm MR PISA Eff ROA: 4 mm MR PISA Radius:  0.30 cm MV E velocity: 150.00 cm/s MV A velocity: 77.10 cm/s MV E/A ratio:  1.95 Mertie Moores MD Electronically signed by Mertie Moores MD Signature Date/Time: 06/20/2019/1:18:33 PM    Final     (Echo, Carotid, EGD, Colonoscopy, ERCP)    Subjective:  Patient resting in bed anxious to go home Discharge  Exam: Vitals:   06/20/19 1054 06/20/19 1112  BP: 114/72 (!) 157/59  Pulse:  61  Resp: 18   Temp:  (!) 97.5 F (36.4 C)  SpO2:  93%   Vitals:   06/20/19 0834 06/20/19 1053 06/20/19 1054 06/20/19 1112  BP:  114/72 114/72 (!) 157/59  Pulse:  63  61  Resp:   18   Temp:    (!) 97.5 F (36.4 C)  TempSrc:    Oral  SpO2: 98%   93%  Weight:      Height:        General: Pt is alert, awake, not in acute distress Cardiovascular: RRR, S1/S2 +, no rubs, no gallops Respiratory: CTA bilaterally, no wheezing, no rhonchi Abdominal: Soft, NT, ND, bowel sounds + Extremities: no edema, no cyanosis    The results of significant diagnostics from this hospitalization (including imaging, microbiology, ancillary and laboratory) are listed below for reference.     Microbiology: Recent Results (from the past 240 hour(s))  SARS Coronavirus 2 by RT PCR (hospital order, performed in [facility] hospital lab) Nasopharyngeal Nasopharyngeal Swab     Status: None   Collection Time: 06/14/19  3:36 PM   Specimen: Nasopharyngeal Swab  Result Value Ref Range Status   SARS Coronavirus 2 NEGATIVE NEGATIVE Final    Comment: (NOTE) SARS-CoV-2 target nucleic acids are NOT DETECTED. The SARS-CoV-2 RNA is generally detectable in upper and lower respiratory specimens during the acute phase of infection. The lowest concentration of SARS-CoV-2 viral copies this assay can detect is 250 copies / mL. A negative result does not preclude SARS-CoV-2 infection and should not be used as the sole basis for treatment or other patient management decisions.  A negative result may occur with improper specimen collection / handling, submission of specimen other than nasopharyngeal swab, presence of viral mutation(s) within the areas targeted by this assay, and inadequate number of viral copies (<250 copies / mL). A negative result must be combined with clinical observations, patient history, and epidemiological  information. Fact Sheet for Patients:   StrictlyIdeas.no Fact Sheet for Healthcare Providers: BankingDealers.co.za This test is not yet approved or cleared  by the [place] FDA and has been authorized for detection and/or diagnosis of SARS-CoV-2 by FDA under an Emergency Use Authorization (EUA).  This EUA will remain in effect (meaning this test can be used) for the duration of the COVID-19 declaration under Section 564(b)(1) of the Act, 21 U.S.C. section 360bbb-3(b)(1), unless the authorization is terminated or revoked sooner. Performed at [facility], [place] [address], [place], [place] [address]   Culture, blood (Routine X 2) w Reflex to ID Panel     Status: None (Preliminary result)   Collection Time: 06/17/19 12:18 PM   Specimen: BLOOD RIGHT ARM  Result Value Ref Range Status   Specimen Description BLOOD RIGHT ARM  Final   Special Requests   Final    BOTTLES DRAWN AEROBIC AND ANAEROBIC Blood Culture results may not be  optimal due to an inadequate volume of blood received in culture bottles   Culture   Final    NO GROWTH 3 DAYS Performed at [facility], [place] [address] [place], [place] [address]    Report Status PENDING  Incomplete  Culture, blood (Routine X 2) w Reflex to ID Panel     Status: None (Preliminary result)   Collection Time: 06/17/19 12:23 PM   Specimen: BLOOD RIGHT HAND  Result Value Ref Range Status   Specimen Description BLOOD RIGHT HAND  Final   Special Requests   Final    BOTTLES DRAWN AEROBIC ONLY Blood Culture results may not be optimal due to an inadequate volume of blood received in culture bottles   Culture   Final    NO GROWTH 3 DAYS Performed at [facility], [place] [address] [place], [place] [address]    Report Status PENDING  Incomplete     Labs: BNP (last 3 results) Recent Labs    01/24/19 2228 02/04/19 0103 03/25/19 1313  BNP 1,570.0* 4,197.2* 2,007.2*   Basic  Metabolic Panel: Recent Labs  Lab 06/14/19 1010 06/15/19 0744 06/16/19 1257 06/17/19 1130 06/18/19 0410  NA 137 137 135 136 129*  K 5.5* 5.6* 3.8 4.2 4.6  CL 101 109 99 100 95*  CO2 18* 16* 27 23 25   GLUCOSE 147* 81 104* 140* 100*  BUN 80* 103* 24* 17 21  CREATININE 6.71* 7.01* 2.87* 2.74* 3.59*  CALCIUM 7.7* 7.3* 7.2* 7.6* 7.6*  PHOS  --   --  4.5 4.0  --    Liver Function Tests: Recent Labs  Lab 06/14/19 1010 06/16/19 1257 06/17/19 1130 06/18/19 0410  AST 16  --   --  21  ALT 13  --   --  12  ALKPHOS 50  --   --  49  BILITOT 1.0  --   --  0.9  PROT 4.4*  --   --  4.0*  ALBUMIN 2.1* 1.6* 2.1* 2.1*   Recent Labs  Lab 06/14/19 1010  LIPASE 42   Recent Labs  Lab 06/17/19 1450  AMMONIA 25   CBC: Recent Labs  Lab 06/14/19 1010 06/14/19 1852 06/15/19 0744 06/15/19 1616 06/16/19 0644 06/17/19 1130 06/19/19 1535  WBC 7.7  --  11.3* 12.7* 11.4* 6.5 7.0  NEUTROABS 6.5  --   --   --   --   --   --   HGB 7.1*   < > 8.5* 8.2* 9.0* 8.2* 7.9*  HCT 23.9*   < > 25.4* 25.5* 27.1* 26.7* 25.5*  MCV 91.2  --  86.7 88.5 86.3 91.8 92.7  PLT 202  --  126* 128* 136* 141* 190   < > = values in this interval not displayed.   Cardiac Enzymes: No results for input(s): CKTOTAL, CKMB, CKMBINDEX, TROPONINI in the last 168 hours. BNP: Invalid input(s): POCBNP CBG: No results for input(s): GLUCAP in the last 168 hours. D-Dimer No results for input(s): DDIMER in the last 72 hours. Hgb A1c Recent Labs    06/19/19 0312  HGBA1C 4.9   Lipid Profile Recent Labs    06/19/19 0312  CHOL 86  HDL 37*  LDLCALC 31  TRIG 92  CHOLHDL 2.3   Thyroid function studies Recent Labs    06/17/19 1450  T3FREE 1.5*   Anemia work up No results for input(s): VITAMINB12, FOLATE, FERRITIN, TIBC, IRON, RETICCTPCT in the last 72 hours. Urinalysis    Component Value  Date/Time   COLORURINE YELLOW 04/23/2019 1440   APPEARANCEUR CLEAR 04/23/2019 1440   LABSPEC 1.029 04/23/2019 1440    PHURINE 8.0 04/23/2019 1440   GLUCOSEU NEGATIVE 04/23/2019 1440   HGBUR NEGATIVE 04/23/2019 1440   BILIRUBINUR NEGATIVE 04/23/2019 1440   KETONESUR NEGATIVE 04/23/2019 1440   PROTEINUR >=300 (A) 04/23/2019 1440   NITRITE NEGATIVE 04/23/2019 1440   LEUKOCYTESUR NEGATIVE 04/23/2019 1440   Sepsis Labs Invalid input(s): PROCALCITONIN,  WBC,  LACTICIDVEN Microbiology Recent Results (from the past 240 hour(s))  SARS Coronavirus 2 by RT PCR (hospital order, performed in [place] hospital lab) Nasopharyngeal Nasopharyngeal Swab     Status: None   Collection Time: 06/14/19  3:36 PM   Specimen: Nasopharyngeal Swab  Result Value Ref Range Status   SARS Coronavirus 2 NEGATIVE NEGATIVE Final    Comment: (NOTE) SARS-CoV-2 target nucleic acids are NOT DETECTED. The SARS-CoV-2 RNA is generally detectable in upper and lower respiratory specimens during the acute phase of infection. The lowest concentration of SARS-CoV-2 viral copies this assay can detect is 250 copies / mL. A negative result does not preclude SARS-CoV-2 infection and should not be used as the sole basis for treatment or other patient management decisions.  A negative result may occur with improper specimen collection / handling, submission of specimen other than nasopharyngeal swab, presence of viral mutation(s) within the areas targeted by this assay, and inadequate number of viral copies (<250 copies / mL). A negative result must be combined with clinical observations, patient history, and epidemiological information. Fact Sheet for Patients:   StrictlyIdeas.no Fact Sheet for Healthcare Providers: BankingDealers.co.za This test is not yet approved or cleared  by the [place] FDA and has been authorized for detection and/or diagnosis of SARS-CoV-2 by FDA under an Emergency Use Authorization (EUA).  This EUA will remain in effect (meaning this test can be used) for the  duration of the COVID-19 declaration under Section 564(b)(1) of the Act, 21 U.S.C. section 360bbb-3(b)(1), unless the authorization is terminated or revoked sooner. Performed at [facility], [place] [address], [place], [place] [address]   Culture, blood (Routine X 2) w Reflex to ID Panel     Status: None (Preliminary result)   Collection Time: 06/17/19 12:18 PM   Specimen: BLOOD RIGHT ARM  Result Value Ref Range Status   Specimen Description BLOOD RIGHT ARM  Final   Special Requests   Final    BOTTLES DRAWN AEROBIC AND ANAEROBIC Blood Culture results may not be optimal due to an inadequate volume of blood received in culture bottles   Culture   Final    NO GROWTH 3 DAYS Performed at [facility], [place] [address] [place], [place] [address]    Report Status PENDING  Incomplete  Culture, blood (Routine X 2) w Reflex to ID Panel     Status: None (Preliminary result)   Collection Time: 06/17/19 12:23 PM   Specimen: BLOOD RIGHT HAND  Result Value Ref Range Status   Specimen Description BLOOD RIGHT HAND  Final   Special Requests   Final    BOTTLES DRAWN AEROBIC ONLY Blood Culture results may not be optimal due to an inadequate volume of blood received in culture bottles   Culture   Final    NO GROWTH 3 DAYS Performed at [facility], [place] [address] [place], [place] [address]    Report Status PENDING  Incomplete     Time coordinating discharge:  39 minutes  SIGNED:  Georgette Shell, MD  [facility] 06/20/2019, 2:10 PM Pager   If 7PM-7AM, please contact night-coverage [email or web address] Password [email or web address]

## 2019-06-20 NOTE — Progress Notes (Signed)
  Echocardiogram 2D Echocardiogram has been performed.  Christina [name] Febles 06/20/2019, 12:28 PM

## 2019-06-20 NOTE — Progress Notes (Signed)
Physical Therapy Treatment Patient Details Name: Christina [name] MRN: [id number] DOB: 03-Mar-1950 Today's Date: 06/20/2019    History of Present Illness  69 y.o. female with medical history significant of SBO; HTN; HLD; depression; CAD with stents; COPD; ESRD on TTS HD; and afib presenting with hematemesis. Experienced bowel prolapse in ED and required 3U PRBC due to HGB below 7. Admitted 06/14/19 for treatment of GI bleed and Afib with RVR    PT Comments    Pt supine in bed on entry agreeable to ambulation with therapy. Pt making progress towards her goals, however continues to have decreased safety awareness in presence of decreased strength and endurance. Pt is supervision for bed mobility and transfers and min guard for ambulation in hallway without AD. D/c plans appropriate at this time. Pt hopeful for d/c home today.    Follow Up Recommendations  Home health PT;Supervision/Assistance - 24 hour(at or near 24 hour assist)     Equipment Recommendations  3in1 (PT)       Precautions / Restrictions Precautions Precautions: Fall    Mobility  Bed Mobility Overal bed mobility: Needs Assistance Bed Mobility: Supine to Sit;Sit to Supine     Supine to sit: Supervision Sit to supine: Supervision   General bed mobility comments: supervision for safety  Transfers Overall transfer level: Needs assistance   Transfers: Sit to/from Stand Sit to Stand: Min guard         General transfer comment: to/from EOB. min guard for safety.  Ambulation/Gait Ambulation/Gait assistance: Min guard Gait Distance (Feet): 120 Feet Assistive device: None Gait Pattern/deviations: Step-through pattern(erratic step width) Gait velocity: slowed Gait velocity interpretation: 1.31 - 2.62 ft/sec, indicative of limited community ambulator General Gait Details: walked without assitistive device in hallway and close guard for safety       Balance     Sitting balance-Leahy Scale: Fair        Standing balance-Leahy Scale: Fair                              Cognition Arousal/Alertness: Awake/alert Behavior During Therapy: Impulsive Overall Cognitive Status: No family/caregiver present to determine baseline cognitive functioning Area of Impairment: Safety/judgement                         Safety/Judgement: Decreased awareness of safety;Decreased awareness of deficits     General Comments: Continues to have decreased problem solving and safety, making abrupt turn to go back to her room when she experiences leg pain         General Comments General comments (skin integrity, edema, etc.): HR in 60s for ambulation today      Pertinent Vitals/Pain Faces Pain Scale: Hurts a little bit Pain Location: bilateral feet with weight bearing Pain Descriptors / Indicators: Sharp;Shooting Pain Intervention(s): Monitored during session;Repositioned           PT Goals (current goals can now be found in the care plan section) Acute Rehab PT Goals Patient Stated Goal: go home soon  PT Goal Formulation: With patient Time For Goal Achievement: 07/01/19 Potential to Achieve Goals: Fair Progress towards PT goals: Progressing toward goals    Frequency    Min 3X/week      PT Plan Discharge plan needs to be updated;Frequency needs to be updated       AM-PAC PT "6 Clicks" Mobility   Outcome Measure  Help needed turning from your back to  your side while in a flat bed without using bedrails?: None Help needed moving from lying on your back to sitting on the side of a flat bed without using bedrails?: None Help needed moving to and from a bed to a chair (including a wheelchair)?: None Help needed standing up from a chair using your arms (e.g., wheelchair or bedside chair)?: None Help needed to walk in hospital room?: A Little Help needed climbing 3-5 steps with a railing? : A Lot 6 Click Score: 21    End of Session Equipment Utilized During Treatment:  Gait belt Activity Tolerance: Patient tolerated treatment well Patient left: in bed;with call bell/phone within reach;with bed alarm set Nurse Communication: Mobility status PT Visit Diagnosis: Unsteadiness on feet (R26.81);Other abnormalities of gait and mobility (R26.89);Muscle weakness (generalized) (M62.81);Difficulty in walking, not elsewhere classified (R26.2)     Time: 1638-4665 PT Time Calculation (min) (ACUTE ONLY): 11 min  Charges:  $Gait Training: 8-22 mins                     Duana Benedict B. Migdalia Dk PT, DPT Acute Rehabilitation Services Pager [phone] Office [phone]    [place] 06/20/2019, 3:19 PM

## 2019-06-20 NOTE — TOC Progression Note (Signed)
Transition of Care [facility]) - Progression Note    Patient Details  Name: CORREEN BUBOLZ MRN: [id number] Date of Birth: July 05, 1950  Transition of Care [facility]) CM/SW [place], [place] Phone Number: 06/20/2019, 11:11 AM  Clinical Narrative:     CSW spoke with patient at bedside. Patient declined SNF placement. Patient is wanting [facility]. CSW called patients [name] to follow up with her. CSW let case manager know that patients daughter wants her to call her about [place] services for patient.  Case manager will follow up with patients daughter for home health services information. CSW will continue to follow.   Expected Discharge Plan: [place] Barriers to Discharge: Continued Medical Work up  Expected Discharge Plan and Services Expected Discharge Plan: [place] In-house Referral: NA Discharge Planning Services: CM Consult Post Acute Care Choice: Home Health(Patient has used [facility] in the past.) Living arrangements for the past 2 months: Single Family Home                                       Social Determinants of Health (SDOH) Interventions    Readmission Risk Interventions Readmission Risk Prevention Plan 06/17/2019 03/29/2019 02/07/2019  Transportation Screening Complete Complete Complete  Medication Review Press photographer) Complete Complete Complete  PCP or Specialist appointment within 3-5 days of discharge - - Complete  HRI or [place] Complete Complete Complete  SW Recovery Care/Counseling Consult Complete Complete Complete  Palliative Care Screening Not Applicable Not Applicable Not [place] (No Data) Not Applicable Not Applicable  Some recent data might be hidden

## 2019-06-20 NOTE — Progress Notes (Signed)
Subjective:  Seen in room, no cos, hd for am , cxr yest no vol excess , ^ edw , 2d just completed   Objective Vital signs in last 24 hours: Vitals:   06/20/19 0746 06/20/19 0834 06/20/19 1053 06/20/19 1112  BP: (!) 161/69  114/72 (!) 157/59  Pulse: 60  63 61  Resp:      Temp: 98 F (36.7 C)   (!) 97.5 F (36.4 C)  TempSrc: Oral   Oral  SpO2: 100% 98%  93%  Weight:      Height:       Weight change: 0.603 kg  Physical Exam: General:alert ,Chronically ill, Frail appearing female , nad  Heart:RRR; 1/6  Sem  Lungs:Clear, bilaterally  Abdomen:BS  + soft, non-tender Extremities:no LE edema; ?ischemic changes to B 5th toes Dialysis Access:TDC + LUE AVF + thrill    Dialysis Orders: TTS at [facility] 4hr, 350/A1.5, EDW 40.5kg, 2K/2Ca, UFP #2, AVF + TDC, no heparin - Mircera 266mcg IV q 2 weeks (last 5/6) - Venofer 100 x 10 ordered - Calcitriol 29mcg PO q HD  Problem/Plan: 1. UGIB:am HGB 7.9 Melena/hematemesis on admit -> given3U PRBCs. EGD 5/20 with duodenal ulcers noted. 2. A-fib OEU:MPNTIRWER, started on amiodarone by cardiology. No AC d/t GIB. 3. Hyperkalemia:S/p HD 5/19 to correct.  Resolved.  4. ESRD:Usual TTS schedule - had missed 1 week HD. Back on schedule. Next HD 5/25.  5. Hypertension/volume:BP stable - no edema on exambut up per weights. Standing weight on 5/23 was 49.6kg. No volume on exam. 5/23 CXR =no Pul edema ,"??? sm effusion" Will ^ edw  When dc// only 1 liter  uf tomor hd  .   6. Anemia(ABLA + ESRD):See #1. Aranesp 278mcg given 1/54. 7. Metabolic bone disease:CorrCa ok - Cont Auryxia binder.  8. Hx mesenteric ischemia: Hx SMA stent 3/31 9. PAD: ?ischemic L 4th/5th toes. 10. AMS/Acute CVA  5/20 - unclear trigger. MRI Brain -chronic ischemic disease w tiny acute cortical infarct in the left occipital - per primary/neuro. Appears at baseline today.   Ernest Haber, PA-C [facility] [phone] 06/20/2019,1:21 PM   LOS: 6 days   Labs: Basic Metabolic Panel: Recent Labs  Lab 06/16/19 1257 06/17/19 1130 06/18/19 0410  NA 135 136 129*  K 3.8 4.2 4.6  CL 99 100 95*  CO2 27 23 25   GLUCOSE 104* 140* 100*  BUN 24* 17 21  CREATININE 2.87* 2.74* 3.59*  CALCIUM 7.2* 7.6* 7.6*  PHOS 4.5 4.0  --    Liver Function Tests: Recent Labs  Lab 06/14/19 1010 06/14/19 1010 06/16/19 1257 06/17/19 1130 06/18/19 0410  AST 16  --   --   --  21  ALT 13  --   --   --  12  ALKPHOS 50  --   --   --  49  BILITOT 1.0  --   --   --  0.9  PROT 4.4*  --   --   --  4.0*  ALBUMIN 2.1*   < > 1.6* 2.1* 2.1*   < > = values in this interval not displayed.   Recent Labs  Lab 06/14/19 1010  LIPASE 42   Recent Labs  Lab 06/17/19 1450  AMMONIA 25   CBC: Recent Labs  Lab 06/14/19 1010 06/14/19 1852 06/15/19 0744 06/15/19 0744 06/15/19 1616 06/15/19 1616 06/16/19 0644 06/17/19 1130 06/19/19 1535  WBC 7.7  --  11.3*   < > 12.7*   < >  11.4* 6.5 7.0  NEUTROABS 6.5  --   --   --   --   --   --   --   --   HGB 7.1*   < > 8.5*   < > 8.2*   < > 9.0* 8.2* 7.9*  HCT 23.9*   < > 25.4*   < > 25.5*   < > 27.1* 26.7* 25.5*  MCV 91.2  --  86.7  --  88.5  --  86.3 91.8 92.7  PLT 202  --  126*   < > 128*   < > 136* 141* 190   < > = values in this interval not displayed.   Cardiac Enzymes: No results for input(s): CKTOTAL, CKMB, CKMBINDEX, TROPONINI in the last 168 hours. CBG: No results for input(s): GLUCAP in the last 168 hours.  Studies/Results: CT ANGIO HEAD W OR WO CONTRAST  Result Date: 06/19/2019 CLINICAL DATA:  69 year old female dialysis patient with encephalopathy, confusion after dialysis. Recent MRI demonstrating a small chronic left occipital cortical infarct, nearby chronic left occipital pole encephalomalacia, underlying advanced chronic ischemic disease. EXAM: CT ANGIOGRAPHY HEAD AND NECK TECHNIQUE: Multidetector CT imaging of the head and neck was performed using the standard protocol during bolus  administration of intravenous contrast. Multiplanar CT image reconstructions and MIPs were obtained to evaluate the vascular anatomy. Carotid stenosis measurements (when applicable) are obtained utilizing NASCET criteria, using the distal internal carotid diameter as the denominator. CONTRAST:  50mL OMNIPAQUE IOHEXOL 350 MG/ML SOLN COMPARISON:  Brain MRI and head CT 06/17/2019. Neck CT without contrast 07/11/2016 FINDINGS: CT HEAD Brain: Subtle cortical hypodensity now on the left lateral occiput corresponding to the DWI lesion (series 5, image 14). No acute intracranial hemorrhage or mass effect. Otherwise Stable non contrast CT appearance of the brain. Calvarium and skull base: No acute osseous abnormality identified. Paranasal sinuses: Visualized paranasal sinuses and mastoids are stable and well pneumatized. Orbits: Stable orbit and scalp soft tissues. CTA NECK Skeleton: Absent maxillary dentition. Advanced cervical spine degeneration with reversal of lordosis. Chronic deformity posterior right 1st and 2nd ribs. No acute osseous abnormality identified. Upper chest: Small layering pleural effusions are partially visible. Mild mosaic pulmonary attenuation. Visible precarinal and AP window lymph nodes are at the upper limits of normal. Right IJ approach dual lumen dialysis catheter. Other neck: No acute findings.  No cervical lymphadenopathy. Aortic arch: Severe atherosclerosis of the visible aorta including bulky mural plaque or thrombus throughout the arch (series 12, images 80 through 134). Three vessel arch configuration. Right carotid system: No brachiocephalic artery or right CCA origin stenosis despite plaque. Tortuous proximal right CCA. Intermittent plaque to the bifurcation without stenosis. Less than 50 % stenosis with respect to the distal vessel of the proximal right ICA despite plaque. Left carotid system: Prominent plaque at the left CCA origin and along the ventral vessel at the thoracic inlet but  less than 50% stenosis (series 9, image 132). At the level of the thyroid combined soft and calcified plaque results in 60% stenosis (series 9, image 122). Capacious left carotid bifurcation with surgical clips compatible with previous endarterectomy. Tortuous left ICA distal to the bulb without stenosis to the skull base. Vertebral arteries: Proximal right subclavian soft and calcified plaque similar to the other great vessels. Moderate stenosis right vertebral artery origin best seen on series 12. Tortuous right V1 segment. No additional right vertebral stenosis to the skull base. Widespread proximal left subclavian artery plaque similar to the other great vessels  without significant stenosis. Calcified plaque at the left vertebral artery origin, moderate stenosis best seen on series 11, image 262. Tortuous left V1 segment. Codominant left vertebral artery otherwise patent to the skull base. CTA HEAD Posterior circulation: Bilateral V4 segment soft and calcified plaque. No significant stenosis on the left but moderate to severe right V4 stenosis just distal to PICA on series 12, image 113. Normal left PICA. Patent vertebrobasilar junction. Patent basilar artery with only mild irregularity. Normal SCA and PCA origins. Posterior communicating arteries are diminutive or absent. The left PCA P3 superior division is occluded, although there is chronic encephalomalacia in the left PCA territory also. On the right there is mild to moderate P1 and P2 irregularity and stenosis. Anterior circulation: Both ICA siphons are patent. Heavy calcification through the left cavernous segment and anterior genu with mild to moderate stenosis. Similar right siphon calcified plaque with at least moderate stenosis at the anterior genu. Patent carotid termini. Normal MCA and ACA origins. Anterior communicating artery and bilateral ACA branches are within normal limits. Left MCA M1 segment and bifurcation are patent without stenosis. Right  MCA M1 segment and bifurcation are patent without stenosis. Bilateral MCA branches are within normal limits. Venous sinuses: Early contrast timing but grossly patent. Anatomic variants: None. Review of the MIP images confirms the above findings IMPRESSION: 1. Negative for large vessel occlusion. 2. Positive for bulky Mural Soft Plaque versus Thrombus throughout the Aortic Arch. See series 12 images 80-134. Aortic Atherosclerosis (ICD10-I70.0). 3. Positive for occluded Left PCA superior P3 branch, although age indeterminate in light of some chronic left PCA territory encephalomalacia. 4. Positive also for significant atherosclerotic stenoses: - Left CCA 60% stenosis at the level of the thyroid. - moderate bilateral vertebral artery origin stenosis, and up to Severe Right Vertebral V4 segment stenosis distal to PICA. - bilateral ICA siphon stenosis, up to moderate on the Left and Moderate To Severe at the Right ICA anterior genu. - moderate Right PCA P2 stenosis. 5.  Stable non contrast CT appearance of the brain. 6. Small bilateral pleural effusions. Advanced cervical spine degeneration. Electronically Signed   By: Genevie Ann M.D.   On: 06/19/2019 13:25   CT ANGIO NECK W OR WO CONTRAST  Result Date: 06/19/2019 CLINICAL DATA:  69 year old female dialysis patient with encephalopathy, confusion after dialysis. Recent MRI demonstrating a small chronic left occipital cortical infarct, nearby chronic left occipital pole encephalomalacia, underlying advanced chronic ischemic disease. EXAM: CT ANGIOGRAPHY HEAD AND NECK TECHNIQUE: Multidetector CT imaging of the head and neck was performed using the standard protocol during bolus administration of intravenous contrast. Multiplanar CT image reconstructions and MIPs were obtained to evaluate the vascular anatomy. Carotid stenosis measurements (when applicable) are obtained utilizing NASCET criteria, using the distal internal carotid diameter as the denominator. CONTRAST:  37mL  OMNIPAQUE IOHEXOL 350 MG/ML SOLN COMPARISON:  Brain MRI and head CT 06/17/2019. Neck CT without contrast 07/11/2016 FINDINGS: CT HEAD Brain: Subtle cortical hypodensity now on the left lateral occiput corresponding to the DWI lesion (series 5, image 14). No acute intracranial hemorrhage or mass effect. Otherwise Stable non contrast CT appearance of the brain. Calvarium and skull base: No acute osseous abnormality identified. Paranasal sinuses: Visualized paranasal sinuses and mastoids are stable and well pneumatized. Orbits: Stable orbit and scalp soft tissues. CTA NECK Skeleton: Absent maxillary dentition. Advanced cervical spine degeneration with reversal of lordosis. Chronic deformity posterior right 1st and 2nd ribs. No acute osseous abnormality identified. Upper chest: Small layering pleural effusions  are partially visible. Mild mosaic pulmonary attenuation. Visible precarinal and AP window lymph nodes are at the upper limits of normal. Right IJ approach dual lumen dialysis catheter. Other neck: No acute findings.  No cervical lymphadenopathy. Aortic arch: Severe atherosclerosis of the visible aorta including bulky mural plaque or thrombus throughout the arch (series 12, images 80 through 134). Three vessel arch configuration. Right carotid system: No brachiocephalic artery or right CCA origin stenosis despite plaque. Tortuous proximal right CCA. Intermittent plaque to the bifurcation without stenosis. Less than 50 % stenosis with respect to the distal vessel of the proximal right ICA despite plaque. Left carotid system: Prominent plaque at the left CCA origin and along the ventral vessel at the thoracic inlet but less than 50% stenosis (series 9, image 132). At the level of the thyroid combined soft and calcified plaque results in 60% stenosis (series 9, image 122). Capacious left carotid bifurcation with surgical clips compatible with previous endarterectomy. Tortuous left ICA distal to the bulb without  stenosis to the skull base. Vertebral arteries: Proximal right subclavian soft and calcified plaque similar to the other great vessels. Moderate stenosis right vertebral artery origin best seen on series 12. Tortuous right V1 segment. No additional right vertebral stenosis to the skull base. Widespread proximal left subclavian artery plaque similar to the other great vessels without significant stenosis. Calcified plaque at the left vertebral artery origin, moderate stenosis best seen on series 11, image 262. Tortuous left V1 segment. Codominant left vertebral artery otherwise patent to the skull base. CTA HEAD Posterior circulation: Bilateral V4 segment soft and calcified plaque. No significant stenosis on the left but moderate to severe right V4 stenosis just distal to PICA on series 12, image 113. Normal left PICA. Patent vertebrobasilar junction. Patent basilar artery with only mild irregularity. Normal SCA and PCA origins. Posterior communicating arteries are diminutive or absent. The left PCA P3 superior division is occluded, although there is chronic encephalomalacia in the left PCA territory also. On the right there is mild to moderate P1 and P2 irregularity and stenosis. Anterior circulation: Both ICA siphons are patent. Heavy calcification through the left cavernous segment and anterior genu with mild to moderate stenosis. Similar right siphon calcified plaque with at least moderate stenosis at the anterior genu. Patent carotid termini. Normal MCA and ACA origins. Anterior communicating artery and bilateral ACA branches are within normal limits. Left MCA M1 segment and bifurcation are patent without stenosis. Right MCA M1 segment and bifurcation are patent without stenosis. Bilateral MCA branches are within normal limits. Venous sinuses: Early contrast timing but grossly patent. Anatomic variants: None. Review of the MIP images confirms the above findings IMPRESSION: 1. Negative for large vessel occlusion.  2. Positive for bulky Mural Soft Plaque versus Thrombus throughout the Aortic Arch. See series 12 images 80-134. Aortic Atherosclerosis (ICD10-I70.0). 3. Positive for occluded Left PCA superior P3 branch, although age indeterminate in light of some chronic left PCA territory encephalomalacia. 4. Positive also for significant atherosclerotic stenoses: - Left CCA 60% stenosis at the level of the thyroid. - moderate bilateral vertebral artery origin stenosis, and up to Severe Right Vertebral V4 segment stenosis distal to PICA. - bilateral ICA siphon stenosis, up to moderate on the Left and Moderate To Severe at the Right ICA anterior genu. - moderate Right PCA P2 stenosis. 5.  Stable non contrast CT appearance of the brain. 6. Small bilateral pleural effusions. Advanced cervical spine degeneration. Electronically Signed   By: Herminio Heads.D.  On: 06/19/2019 13:25   DG CHEST PORT 1 VIEW  Result Date: 06/19/2019 CLINICAL DATA:  End-stage renal disease.  Dialysis. EXAM: PORTABLE CHEST 1 VIEW COMPARISON:  Jun 17, 2019 FINDINGS: The dialysis catheter terminates near the caval atrial junction. No pneumothorax. No overt edema. No nodules or masses. No focal infiltrates. There may be a small left effusion. The cardiomediastinal silhouette is stable. IMPRESSION: No overt edema. Stable dialysis catheter. There may be a small left effusion. Electronically Signed   By: Dorise Bullion III M.D   On: 06/19/2019 13:30   ECHOCARDIOGRAM COMPLETE  Result Date: 06/20/2019    ECHOCARDIOGRAM REPORT   Patient Name:   Christina [name] Date of Exam: 06/20/2019 Medical Rec #:  [id number]          Height:       62.0 in Accession #:    [id number]         Weight:       112.0 lb Date of Birth:  03/28/1950           BSA:          1.494 m Patient Age:    69 years           BP:           157/59 mmHg Patient Gender: F                  HR:           82 bpm. Exam Location:  Inpatient Procedure: 2D Echo, Color Doppler and Cardiac Doppler  Indications:    Stroke 434.91/ I163.9  History:        Patient has prior history of Echocardiogram examinations, most                 recent 01/25/2019. CAD, COPD, Arrythmias:Atrial Fibrillation;                 Risk Factors:Hypertension, Dyslipidemia and GERD. CKD.  Sonographer:    Jonelle Sidle Dance Referring Phys: [id number] ASHISH ARORA IMPRESSIONS  1. Left ventricular ejection fraction, by estimation, is 65 to 70%. The left ventricle has normal function. The left ventricle has no regional wall motion abnormalities. Left ventricular diastolic parameters are consistent with Grade II diastolic dysfunction (pseudonormalization).  2. Right ventricular systolic function is normal. The right ventricular size is normal. There is moderately elevated pulmonary artery systolic pressure. The estimated right ventricular systolic pressure is 86.7 mmHg.  3. Left atrial size was mild to moderately dilated.  4. Right atrial size was mildly dilated.  5. The mitral valve is normal in structure. Moderate mitral valve regurgitation. No evidence of mitral stenosis.  6. Tricuspid valve regurgitation is moderate.  7. The aortic valve is normal in structure. Aortic valve regurgitation is not visualized. No aortic stenosis is present. FINDINGS  Left Ventricle: Left ventricular ejection fraction, by estimation, is 65 to 70%. The left ventricle has normal function. The left ventricle has no regional wall motion abnormalities. The left ventricular internal cavity size was normal in size. There is  no left ventricular hypertrophy. Left ventricular diastolic parameters are consistent with Grade II diastolic dysfunction (pseudonormalization). Right Ventricle: The right ventricular size is normal. No increase in right ventricular wall thickness. Right ventricular systolic function is normal. There is moderately elevated pulmonary artery systolic pressure. The tricuspid regurgitant velocity is 3.35 m/s, and with an assumed right atrial pressure of 8  mmHg, the estimated right ventricular systolic pressure is 67.2 mmHg. Left Atrium:  Left atrial size was mild to moderately dilated. Right Atrium: Right atrial size was mildly dilated. Pericardium: There is no evidence of pericardial effusion. Mitral Valve: The mitral valve is normal in structure. Moderate mitral valve regurgitation. No evidence of mitral valve stenosis. Tricuspid Valve: The tricuspid valve is grossly normal. Tricuspid valve regurgitation is moderate . No evidence of tricuspid stenosis. Aortic Valve: The aortic valve is normal in structure. Aortic valve regurgitation is not visualized. Aortic regurgitation PHT measures 378 msec. No aortic stenosis is present. Pulmonic Valve: The pulmonic valve was normal in structure. Pulmonic valve regurgitation is trivial. Aorta: The aortic root and ascending aorta are structurally normal, with no evidence of dilitation. IAS/Shunts: The atrial septum is grossly normal.  LEFT VENTRICLE PLAX 2D LVIDd:         4.00 cm  Diastology LVIDs:         2.36 cm  LV e' lateral:   6.90 cm/s LV PW:         0.96 cm  LV E/e' lateral: 21.7 LV IVS:        0.91 cm  LV e' medial:    5.10 cm/s LVOT diam:     1.70 cm  LV E/e' medial:  29.4 LV SV:         45 LV SV Index:   30 LVOT Area:     2.27 cm  RIGHT VENTRICLE             IVC RV Basal diam:  3.02 cm     IVC diam: 2.27 cm RV Mid diam:    1.83 cm RV S prime:     10.80 cm/s TAPSE (M-mode): 1.8 cm LEFT ATRIUM             Index       RIGHT ATRIUM           Index LA diam:        3.80 cm 2.54 cm/m  RA Area:     19.50 cm LA Vol (A2C):   74.0 ml 49.53 ml/m RA Volume:   54.70 ml  36.61 ml/m LA Vol (A4C):   54.7 ml 36.61 ml/m LA Biplane Vol: 64.4 ml 43.10 ml/m  AORTIC VALVE LVOT Vmax:   93.80 cm/s LVOT Vmean:  61.700 cm/s LVOT VTI:    0.199 m AI PHT:      378 msec  AORTA Ao Root diam: 3.00 cm Ao Asc diam:  2.70 cm MITRAL VALVE                 TRICUSPID VALVE MV Area (PHT): 2.76 cm      TR Peak grad:   44.9 mmHg MV Decel Time: 275 msec       TR Vmax:        335.00 cm/s MR Peak grad:    126.8 mmHg MR Mean grad:    86.0 mmHg   SHUNTS MR Vmax:         563.00 cm/s Systemic VTI:  0.20 m MR Vmean:        437.0 cm/s  Systemic Diam: 1.70 cm MR PISA:         0.57 cm MR PISA Eff ROA: 4 mm MR PISA Radius:  0.30 cm MV E velocity: 150.00 cm/s MV A velocity: 77.10 cm/s MV E/A ratio:  1.95 Mertie Moores MD Electronically signed by Mertie Moores MD Signature Date/Time: 06/20/2019/1:18:33 PM    Final    Medications:  .  stroke: mapping our early stages  of recovery book   Does not apply Once  .  stroke: mapping our early stages of recovery book   Does not apply Once  . allopurinol  100 mg Oral Daily  . amiodarone  200 mg Oral BID  . aspirin  81 mg Oral Daily  . Chlorhexidine Gluconate Cloth  6 each Topical Daily  . clopidogrel  75 mg Oral Daily  . darbepoetin (ARANESP) injection - DIALYSIS  200 mcg Intravenous Q Thu-HD  . dicyclomine  10 mg Oral TID AC & HS  . ferric citrate  630 mg Oral TID WC  . gabapentin  300 mg Oral QHS  . isosorbide mononitrate  30 mg Oral QHS  . metoprolol tartrate  100 mg Oral BID  . mometasone-formoterol  2 puff Inhalation BID  . pantoprazole (PROTONIX) IV  40 mg Intravenous Q12H  . pravastatin  80 mg Oral QPM  . sucralfate  1 g Oral Q6H

## 2019-06-22 ENCOUNTER — Telehealth: Payer: Self-pay | Admitting: Nephrology

## 2019-06-22 LAB — CULTURE, BLOOD (ROUTINE X 2)
Culture: NO GROWTH
Culture: NO GROWTH

## 2019-06-22 NOTE — Telephone Encounter (Signed)
Transition of care contact from inpatient facility  Date of discharge:06/20/19 Date of contact:06/22/19 Method: Phone Spoke to:  Patient   Patient contacted to discuss transition of care from recent inpatient hospitalization. Patient was admitted to [facility] from5/18/21  to 5/24/21with discharge diagnosis GI Bleed / A fib with RVR  ("dc on Amiodarone ")and to have tapering dose Lopressor  Per dcs  Medication changes were reviewed.told to bring to OP Kidney unit tomorrow if questions   Patient will follow up with his/her outpatient HD unit on:5/27/221 and provider to see at unit 06/23/19

## 2019-06-28 NOTE — Anesthesia Postprocedure Evaluation (Signed)
Anesthesia Post Note  Patient: Christina [name]  Procedure(s) Performed: ESOPHAGOGASTRODUODENOSCOPY (EGD) (N/A )     Patient location during evaluation: Endoscopy Anesthesia Type: MAC Level of consciousness: awake and alert Pain management: pain level controlled Vital Signs Assessment: post-procedure vital signs reviewed and stable Respiratory status: spontaneous breathing, nonlabored ventilation, respiratory function stable and patient connected to nasal cannula oxygen Cardiovascular status: blood pressure returned to baseline and stable Postop Assessment: no apparent nausea or vomiting Anesthetic complications: no    Last Vitals:  Vitals:   06/20/19 1622 06/20/19 1947  BP: (!) 169/64 (!) 146/94  Pulse: (!) 58 (!) 59  Resp:  14  Temp: 36.7 C 36.6 C  SpO2: 100% 100%    Last Pain:  Vitals:   06/20/19 1947  TempSrc: Oral  PainSc:                  [place]

## 2019-06-29 ENCOUNTER — Encounter (HOSPITAL_COMMUNITY): Payer: Self-pay | Admitting: Emergency Medicine

## 2019-06-29 ENCOUNTER — Inpatient Hospital Stay (HOSPITAL_COMMUNITY)
Admission: EM | Admit: 2019-06-29 | Discharge: 2019-07-14 | DRG: 356 | Disposition: A | Discharge status: Skilled Nursing Facility | Payer: Medicare Other | Attending: Internal Medicine | Admitting: Internal Medicine

## 2019-06-29 DIAGNOSIS — J449 Chronic obstructive pulmonary disease, unspecified: Secondary | ICD-10-CM

## 2019-06-29 DIAGNOSIS — G43909 Migraine, unspecified, not intractable, without status migrainosus: Secondary | ICD-10-CM | POA: Diagnosis present

## 2019-06-29 DIAGNOSIS — I2510 Atherosclerotic heart disease of native coronary artery without angina pectoris: Secondary | ICD-10-CM | POA: Diagnosis present

## 2019-06-29 DIAGNOSIS — D649 Anemia, unspecified: Secondary | ICD-10-CM

## 2019-06-29 DIAGNOSIS — N2581 Secondary hyperparathyroidism of renal origin: Secondary | ICD-10-CM | POA: Diagnosis present

## 2019-06-29 DIAGNOSIS — Z992 Dependence on renal dialysis: Secondary | ICD-10-CM

## 2019-06-29 DIAGNOSIS — Z7951 Long term (current) use of inhaled steroids: Secondary | ICD-10-CM

## 2019-06-29 DIAGNOSIS — R64 Cachexia: Secondary | ICD-10-CM | POA: Diagnosis present

## 2019-06-29 DIAGNOSIS — I97638 Postprocedural hematoma of a circulatory system organ or structure following other circulatory system procedure: Secondary | ICD-10-CM | POA: Diagnosis not present

## 2019-06-29 DIAGNOSIS — L97529 Non-pressure chronic ulcer of other part of left foot with unspecified severity: Secondary | ICD-10-CM | POA: Diagnosis present

## 2019-06-29 DIAGNOSIS — Z7982 Long term (current) use of aspirin: Secondary | ICD-10-CM

## 2019-06-29 DIAGNOSIS — K2100 Gastro-esophageal reflux disease with esophagitis, without bleeding: Secondary | ICD-10-CM | POA: Diagnosis present

## 2019-06-29 DIAGNOSIS — I70245 Atherosclerosis of native arteries of left leg with ulceration of other part of foot: Secondary | ICD-10-CM | POA: Diagnosis present

## 2019-06-29 DIAGNOSIS — Z79899 Other long term (current) drug therapy: Secondary | ICD-10-CM

## 2019-06-29 DIAGNOSIS — K551 Chronic vascular disorders of intestine: Secondary | ICD-10-CM | POA: Diagnosis not present

## 2019-06-29 DIAGNOSIS — Z681 Body mass index (BMI) 19 or less, adult: Secondary | ICD-10-CM

## 2019-06-29 DIAGNOSIS — Z7901 Long term (current) use of anticoagulants: Secondary | ICD-10-CM

## 2019-06-29 DIAGNOSIS — Z66 Do not resuscitate: Secondary | ICD-10-CM | POA: Diagnosis present

## 2019-06-29 DIAGNOSIS — K264 Chronic or unspecified duodenal ulcer with hemorrhage: Principal | ICD-10-CM

## 2019-06-29 DIAGNOSIS — I480 Paroxysmal atrial fibrillation: Secondary | ICD-10-CM | POA: Diagnosis present

## 2019-06-29 DIAGNOSIS — M1990 Unspecified osteoarthritis, unspecified site: Secondary | ICD-10-CM | POA: Diagnosis present

## 2019-06-29 DIAGNOSIS — F05 Delirium due to known physiological condition: Secondary | ICD-10-CM | POA: Diagnosis not present

## 2019-06-29 DIAGNOSIS — Z880 Allergy status to penicillin: Secondary | ICD-10-CM

## 2019-06-29 DIAGNOSIS — Z20822 Contact with and (suspected) exposure to covid-19: Secondary | ICD-10-CM | POA: Diagnosis present

## 2019-06-29 DIAGNOSIS — E46 Unspecified protein-calorie malnutrition: Secondary | ICD-10-CM | POA: Diagnosis present

## 2019-06-29 DIAGNOSIS — Z888 Allergy status to other drugs, medicaments and biological substances status: Secondary | ICD-10-CM

## 2019-06-29 DIAGNOSIS — N186 End stage renal disease: Secondary | ICD-10-CM

## 2019-06-29 DIAGNOSIS — Z8719 Personal history of other diseases of the digestive system: Secondary | ICD-10-CM

## 2019-06-29 DIAGNOSIS — Z87891 Personal history of nicotine dependence: Secondary | ICD-10-CM

## 2019-06-29 DIAGNOSIS — R000 Tachycardia, unspecified: Secondary | ICD-10-CM

## 2019-06-29 DIAGNOSIS — Y838 Other surgical procedures as the cause of abnormal reaction of the patient, or of later complication, without mention of misadventure at the time of the procedure: Secondary | ICD-10-CM | POA: Diagnosis not present

## 2019-06-29 DIAGNOSIS — E785 Hyperlipidemia, unspecified: Secondary | ICD-10-CM | POA: Diagnosis present

## 2019-06-29 DIAGNOSIS — D62 Acute posthemorrhagic anemia: Secondary | ICD-10-CM | POA: Diagnosis present

## 2019-06-29 DIAGNOSIS — D631 Anemia in chronic kidney disease: Secondary | ICD-10-CM | POA: Diagnosis present

## 2019-06-29 DIAGNOSIS — K2970 Gastritis, unspecified, without bleeding: Secondary | ICD-10-CM | POA: Diagnosis present

## 2019-06-29 DIAGNOSIS — I120 Hypertensive chronic kidney disease with stage 5 chronic kidney disease or end stage renal disease: Secondary | ICD-10-CM | POA: Diagnosis present

## 2019-06-29 DIAGNOSIS — K921 Melena: Secondary | ICD-10-CM

## 2019-06-29 DIAGNOSIS — Z8673 Personal history of transient ischemic attack (TIA), and cerebral infarction without residual deficits: Secondary | ICD-10-CM

## 2019-06-29 DIAGNOSIS — Z885 Allergy status to narcotic agent status: Secondary | ICD-10-CM

## 2019-06-29 DIAGNOSIS — R109 Unspecified abdominal pain: Secondary | ICD-10-CM | POA: Diagnosis not present

## 2019-06-29 DIAGNOSIS — K623 Rectal prolapse: Secondary | ICD-10-CM | POA: Diagnosis present

## 2019-06-29 DIAGNOSIS — K922 Gastrointestinal hemorrhage, unspecified: Secondary | ICD-10-CM | POA: Diagnosis present

## 2019-06-29 DIAGNOSIS — Z7902 Long term (current) use of antithrombotics/antiplatelets: Secondary | ICD-10-CM

## 2019-06-29 DIAGNOSIS — E8770 Fluid overload, unspecified: Secondary | ICD-10-CM | POA: Diagnosis present

## 2019-06-29 DIAGNOSIS — I959 Hypotension, unspecified: Secondary | ICD-10-CM | POA: Diagnosis present

## 2019-06-29 DIAGNOSIS — K625 Hemorrhage of anus and rectum: Secondary | ICD-10-CM

## 2019-06-29 DIAGNOSIS — K449 Diaphragmatic hernia without obstruction or gangrene: Secondary | ICD-10-CM | POA: Diagnosis present

## 2019-06-29 DIAGNOSIS — M109 Gout, unspecified: Secondary | ICD-10-CM | POA: Diagnosis present

## 2019-06-29 DIAGNOSIS — M898X9 Other specified disorders of bone, unspecified site: Secondary | ICD-10-CM | POA: Diagnosis present

## 2019-06-29 LAB — COMPREHENSIVE METABOLIC PANEL
ALT: 11 U/L (ref 0–44)
AST: 15 U/L (ref 15–41)
Albumin: 2.2 g/dL — ABNORMAL LOW (ref 3.5–5.0)
Alkaline Phosphatase: 43 U/L (ref 38–126)
Anion gap: 18 — ABNORMAL HIGH (ref 5–15)
BUN: 91 mg/dL — ABNORMAL HIGH (ref 8–23)
CO2: 17 mmol/L — ABNORMAL LOW (ref 22–32)
Calcium: 8.1 mg/dL — ABNORMAL LOW (ref 8.9–10.3)
Chloride: 103 mmol/L (ref 98–111)
Creatinine, Ser: 7.39 mg/dL — ABNORMAL HIGH (ref 0.44–1.00)
GFR calc Af Amer: 6 mL/min — ABNORMAL LOW (ref >60)
GFR calc non Af Amer: 5 mL/min — ABNORMAL LOW (ref >60)
Glucose, Bld: 101 mg/dL — ABNORMAL HIGH (ref 70–99)
Potassium: 5 mmol/L (ref 3.5–5.1)
Sodium: 138 mmol/L (ref 135–145)
Total Bilirubin: 0.7 mg/dL (ref 0.3–1.2)
Total Protein: 4.9 g/dL — ABNORMAL LOW (ref 6.5–8.1)

## 2019-06-29 LAB — CBC WITH DIFFERENTIAL/PLATELET
Abs Immature Granulocytes: 0.03 10*3/uL (ref 0.00–0.07)
Basophils Absolute: 0.1 10*3/uL (ref 0.0–0.1)
Basophils Relative: 1 %
Eosinophils Absolute: 0.1 10*3/uL (ref 0.0–0.5)
Eosinophils Relative: 2 %
HCT: 22.5 % — ABNORMAL LOW (ref 36.0–46.0)
Hemoglobin: 6.8 g/dL — CL (ref 12.0–15.0)
Immature Granulocytes: 1 %
Lymphocytes Relative: 14 %
Lymphs Abs: 0.9 10*3/uL (ref 0.7–4.0)
MCH: 28.5 pg (ref 26.0–34.0)
MCHC: 30.2 g/dL (ref 30.0–36.0)
MCV: 94.1 fL (ref 80.0–100.0)
Monocytes Absolute: 0.5 10*3/uL (ref 0.1–1.0)
Monocytes Relative: 8 %
Neutro Abs: 4.7 10*3/uL (ref 1.7–7.7)
Neutrophils Relative %: 74 %
Platelets: 261 10*3/uL (ref 150–400)
RBC: 2.39 MIL/uL — ABNORMAL LOW (ref 3.87–5.11)
RDW: 17.6 % — ABNORMAL HIGH (ref 11.5–15.5)
WBC: 6.3 10*3/uL (ref 4.0–10.5)
nRBC: 0 % (ref 0.0–0.2)

## 2019-06-29 LAB — SARS CORONAVIRUS 2 BY RT PCR (HOSPITAL ORDER, PERFORMED IN ~~LOC~~ HOSPITAL LAB): SARS Coronavirus 2: NEGATIVE

## 2019-06-29 LAB — PREPARE RBC (CROSSMATCH)

## 2019-06-29 MED ORDER — SODIUM CHLORIDE 0.9 % IV SOLN
125.0000 mg | INTRAVENOUS | Status: DC
  Administered 2019-07-01 – 2019-07-11 (×4): 125 mg via INTRAVENOUS
  Filled 2019-06-29 (×8): qty 10

## 2019-06-29 MED ORDER — SODIUM CHLORIDE 0.9 % IV SOLN: 100.0000 mL | INTRAVENOUS | Status: DC | PRN

## 2019-06-29 MED ORDER — FERRIC CITRATE 1 GM 210 MG(FE) PO TABS
630.0000 mg | ORAL_TABLET | Freq: Three times a day (TID) | ORAL | Status: DC
  Administered 2019-07-01 – 2019-07-11 (×25): 630 mg via ORAL
  Filled 2019-06-29 (×30): qty 3

## 2019-06-29 MED ORDER — DICYCLOMINE HCL 10 MG PO CAPS
10.0000 mg | ORAL_CAPSULE | Freq: Every day | ORAL | Status: DC
  Administered 2019-06-29 – 2019-07-11 (×13): 10 mg via ORAL
  Filled 2019-06-29 (×13): qty 1

## 2019-06-29 MED ORDER — METOPROLOL TARTRATE 25 MG PO TABS
25.0000 mg | ORAL_TABLET | Freq: Two times a day (BID) | ORAL | Status: DC
  Administered 2019-06-29: 25 mg via ORAL
  Filled 2019-06-29: qty 1

## 2019-06-29 MED ORDER — VITAMIN D 25 MCG (1000 UNIT) PO TABS
2000.0000 [IU] | ORAL_TABLET | Freq: Every day | ORAL | Status: DC
  Administered 2019-06-29 – 2019-07-12 (×13): 2000 [IU] via ORAL
  Filled 2019-06-29 (×14): qty 2

## 2019-06-29 MED ORDER — LIDOCAINE-PRILOCAINE 2.5-2.5 % EX CREA
1.0000 "application " | TOPICAL_CREAM | CUTANEOUS | Status: DC | PRN
  Filled 2019-06-29: qty 5

## 2019-06-29 MED ORDER — IPRATROPIUM-ALBUTEROL 0.5-2.5 (3) MG/3ML IN SOLN: 3.0000 mL | Freq: Four times a day (QID) | RESPIRATORY_TRACT | Status: DC | PRN

## 2019-06-29 MED ORDER — SUCRALFATE 1 GM/10ML PO SUSP
1.0000 g | Freq: Four times a day (QID) | ORAL | Status: DC
  Administered 2019-06-30 – 2019-07-12 (×41): 1 g via ORAL
  Filled 2019-06-29 (×51): qty 10

## 2019-06-29 MED ORDER — FENTANYL CITRATE (PF) 100 MCG/2ML IJ SOLN: 25.0000 ug | Freq: Once | INTRAMUSCULAR | Status: DC

## 2019-06-29 MED ORDER — CLOBETASOL PROPIONATE 0.05 % EX CREA
1.0000 "application " | TOPICAL_CREAM | Freq: Every day | CUTANEOUS | Status: DC | PRN
  Filled 2019-06-29: qty 15

## 2019-06-29 MED ORDER — DARBEPOETIN ALFA 200 MCG/0.4ML IJ SOSY
200.0000 ug | PREFILLED_SYRINGE | INTRAMUSCULAR | Status: DC
  Filled 2019-06-29: qty 0.4

## 2019-06-29 MED ORDER — HEPARIN SODIUM (PORCINE) 1000 UNIT/ML IJ SOLN
1000.0000 [IU] | INTRAMUSCULAR | Status: DC | PRN
  Filled 2019-06-29 (×2): qty 1

## 2019-06-29 MED ORDER — DIPHENHYDRAMINE HCL 25 MG PO CAPS
25.0000 mg | ORAL_CAPSULE | Freq: Every evening | ORAL | Status: DC | PRN
  Administered 2019-06-30 – 2019-07-02 (×2): 25 mg via ORAL
  Filled 2019-06-29 (×2): qty 1

## 2019-06-29 MED ORDER — DOCUSATE SODIUM 100 MG PO CAPS
100.0000 mg | ORAL_CAPSULE | Freq: Two times a day (BID) | ORAL | Status: DC
  Administered 2019-06-29 – 2019-07-12 (×20): 100 mg via ORAL
  Filled 2019-06-29 (×24): qty 1

## 2019-06-29 MED ORDER — PENTAFLUOROPROP-TETRAFLUOROETH EX AERO
1.0000 "application " | INHALATION_SPRAY | CUTANEOUS | Status: DC | PRN
  Filled 2019-06-29: qty 116

## 2019-06-29 MED ORDER — ALBUTEROL SULFATE (2.5 MG/3ML) 0.083% IN NEBU: 2.5000 mg | INHALATION_SOLUTION | Freq: Four times a day (QID) | RESPIRATORY_TRACT | Status: DC | PRN

## 2019-06-29 MED ORDER — SODIUM CHLORIDE 0.9 % IV SOLN
125.0000 mg | INTRAVENOUS | Status: DC
  Filled 2019-06-29: qty 10

## 2019-06-29 MED ORDER — PRAVASTATIN SODIUM 40 MG PO TABS
80.0000 mg | ORAL_TABLET | Freq: Every evening | ORAL | Status: DC
  Administered 2019-06-29 – 2019-07-11 (×11): 80 mg via ORAL
  Filled 2019-06-29 (×11): qty 2

## 2019-06-29 MED ORDER — LIDOCAINE HCL (PF) 1 % IJ SOLN
5.0000 mL | INTRAMUSCULAR | Status: DC | PRN
  Filled 2019-06-29: qty 5

## 2019-06-29 MED ORDER — LIDOCAINE-PRILOCAINE 2.5-2.5 % EX CREA
1.0000 "application " | TOPICAL_CREAM | Freq: Every day | CUTANEOUS | Status: DC | PRN
  Filled 2019-06-29: qty 5

## 2019-06-29 MED ORDER — NITROGLYCERIN 0.4 MG SL SUBL: 0.4000 mg | SUBLINGUAL_TABLET | SUBLINGUAL | Status: DC | PRN

## 2019-06-29 MED ORDER — FENTANYL CITRATE (PF) 100 MCG/2ML IJ SOLN
50.0000 ug | Freq: Once | INTRAMUSCULAR | Status: AC
  Administered 2019-06-29: 50 ug via INTRAVENOUS
  Filled 2019-06-29: qty 2

## 2019-06-29 MED ORDER — ALTEPLASE 2 MG IJ SOLR
2.0000 mg | Freq: Once | INTRAMUSCULAR | Status: DC | PRN
Start: 2019-06-29 — End: 2019-07-05

## 2019-06-29 MED ORDER — SODIUM CHLORIDE 0.9% IV SOLUTION: Freq: Once | INTRAVENOUS | Status: DC

## 2019-06-29 MED ORDER — ASPIRIN EC 81 MG PO TBEC
81.0000 mg | DELAYED_RELEASE_TABLET | Freq: Every day | ORAL | Status: DC
  Administered 2019-06-29: 81 mg via ORAL
  Filled 2019-06-29: qty 1

## 2019-06-29 MED ORDER — CHLORHEXIDINE GLUCONATE CLOTH 2 % EX PADS
6.0000 | MEDICATED_PAD | Freq: Every day | CUTANEOUS | Status: DC
  Administered 2019-07-01 – 2019-07-05 (×5): 6 via TOPICAL

## 2019-06-29 MED ORDER — ALLOPURINOL 100 MG PO TABS
100.0000 mg | ORAL_TABLET | Freq: Every day | ORAL | Status: DC
  Administered 2019-06-29 – 2019-07-12 (×13): 100 mg via ORAL
  Filled 2019-06-29 (×14): qty 1

## 2019-06-29 MED ORDER — POLYETHYLENE GLYCOL 3350 17 G PO PACK: 17.0000 g | PACK | Freq: Two times a day (BID) | ORAL | Status: DC | PRN

## 2019-06-29 MED ORDER — FENTANYL CITRATE (PF) 100 MCG/2ML IJ SOLN
100.0000 ug | Freq: Once | INTRAMUSCULAR | Status: AC
  Administered 2019-06-29: 100 ug via INTRAVENOUS
  Filled 2019-06-29: qty 2

## 2019-06-29 MED ORDER — PANTOPRAZOLE SODIUM 40 MG PO TBEC
40.0000 mg | DELAYED_RELEASE_TABLET | Freq: Two times a day (BID) | ORAL | Status: DC
  Administered 2019-06-29: 40 mg via ORAL
  Filled 2019-06-29: qty 1

## 2019-06-29 MED ORDER — SENNOSIDES-DOCUSATE SODIUM 8.6-50 MG PO TABS
1.0000 | ORAL_TABLET | Freq: Two times a day (BID) | ORAL | Status: DC | PRN
  Administered 2019-07-10: 1 via ORAL
  Filled 2019-06-29: qty 1

## 2019-06-29 MED ORDER — AMIODARONE HCL 200 MG PO TABS
200.0000 mg | ORAL_TABLET | Freq: Two times a day (BID) | ORAL | Status: DC
  Administered 2019-06-29 – 2019-07-12 (×25): 200 mg via ORAL
  Filled 2019-06-29 (×25): qty 1

## 2019-06-29 MED ORDER — MOMETASONE FURO-FORMOTEROL FUM 200-5 MCG/ACT IN AERO
2.0000 | INHALATION_SPRAY | Freq: Two times a day (BID) | RESPIRATORY_TRACT | Status: DC
  Administered 2019-06-30 – 2019-07-12 (×20): 2 via RESPIRATORY_TRACT
  Filled 2019-06-29 (×2): qty 8.8

## 2019-06-29 MED ORDER — ISOSORBIDE MONONITRATE ER 30 MG PO TB24
30.0000 mg | ORAL_TABLET | Freq: Every day | ORAL | Status: DC
  Administered 2019-06-30 – 2019-07-11 (×12): 30 mg via ORAL
  Filled 2019-06-29 (×12): qty 1

## 2019-06-29 MED ORDER — CLOPIDOGREL BISULFATE 75 MG PO TABS
75.0000 mg | ORAL_TABLET | Freq: Every day | ORAL | Status: DC
  Administered 2019-06-29: 75 mg via ORAL
  Filled 2019-06-29: qty 1

## 2019-06-29 MED ORDER — ONDANSETRON HCL 4 MG PO TABS
4.0000 mg | ORAL_TABLET | Freq: Three times a day (TID) | ORAL | Status: DC | PRN
  Administered 2019-06-30: 4 mg via ORAL
  Filled 2019-06-29: qty 1

## 2019-06-29 MED ORDER — DIPHENHYDRAMINE HCL 25 MG PO TABS
25.0000 mg | ORAL_TABLET | Freq: Every evening | ORAL | Status: DC | PRN
  Filled 2019-06-29: qty 1

## 2019-06-29 NOTE — ED Provider Notes (Signed)
I was made aware of this patient by Chapman Moss, PA-C at shift change.  I did place an order for fentanyl, however there was no other laboratory work-up or imaging pending at time of handoff.  Plan is for her to have transfusion, boarded in the ER and ultimately have dialysis tomorrow, and then outpatient vascular follow-up.  Home meds were already reordered.  Dr. Eulis Foster is already familiar with patient and will continue to follow.   Corena Herter, PA-C 06/29/19 2222    Daleen Bo, MD 06/29/19 [phone]

## 2019-06-29 NOTE — Evaluation (Signed)
Physical Therapy Evaluation Patient Details Name: Christina [name] MRN: [id number] DOB: [birth year] Today's Date: 06/29/2019   History of Present Illness  Pt is a 69 y/o female admitted secondary to rectoal prolapse and anemia. PMH includes HTN, depression, CAD s/p stent, COPD, ESRD on HD TTS, and a fib.   Clinical Impression  Pt presenting to ED secondary to problem above with deficits below. Pt with increased swelling and redness in bilateral feet. Also reporting increased pain, so unable to attempt OOB mobility. Per pt, she lives alone and has been unable to ambulate. Feel pt would benefit from SNF level therapies at d/c. Will continue to follow acutely to maximize functional mobility independence and safety.     Follow Up Recommendations SNF;Supervision/Assistance - 24 hour    Equipment Recommendations  None recommended by PT    Recommendations for Other Services       Precautions / Restrictions Precautions Precautions: Fall Restrictions Weight Bearing Restrictions: No      Mobility  Bed Mobility Overal bed mobility: Needs Assistance Bed Mobility: Supine to Sit;Sit to Supine     Supine to sit: Supervision Sit to supine: Supervision   General bed mobility comments: Supervision for safety. Pt with increased redness when feet in dependent position and reporting increased burning. Further mobility deferred secondary to pain.   Transfers                    Ambulation/Gait                Stairs            Wheelchair Mobility    Modified Rankin (Stroke Patients Only)       Balance Overall balance assessment: Needs assistance Sitting-balance support: No upper extremity supported;Feet supported Sitting balance-Leahy Scale: Fair                                       Pertinent Vitals/Pain Pain Assessment: Faces Faces Pain Scale: Hurts even more Pain Location: BLE  Pain Descriptors / Indicators: Burning Pain Intervention(s):  Monitored during session;Limited activity within patient's tolerance;Repositioned    Home Living Family/patient expects to be discharged to:: Skilled nursing facility                      Prior Function Level of Independence: Independent with assistive device(s)         Comments: Reports she has been in the bed most of the time secondary to pain and swelling in BLE. Used RW when going to bathroom.      Hand Dominance        Extremity/Trunk Assessment   Upper Extremity Assessment Upper Extremity Assessment: Generalized weakness    Lower Extremity Assessment Lower Extremity Assessment: RLE deficits/detail;LLE deficits/detail RLE Deficits / Details: Increased swelling noted. Feet turning deep red with dependent position.  LLE Deficits / Details: Increased swelling noted. Feet turning deep red with dependent position.        Communication   Communication: No difficulties  Cognition Arousal/Alertness: Awake/alert Behavior During Therapy: WFL for tasks assessed/performed Overall Cognitive Status: Within Functional Limits for tasks assessed                                 General Comments: Aware of deficits and reports she needs extra assist at d/c  General Comments      Exercises     Assessment/Plan    PT Assessment Patient needs continued PT services  PT Problem List Decreased coordination;Decreased mobility;Decreased balance;Decreased activity tolerance;Decreased strength;Pain;Decreased knowledge of use of DME       PT Treatment Interventions DME instruction;Gait training;Functional mobility training;Therapeutic activities;Therapeutic exercise;Balance training;Patient/family education    PT Goals (Current goals can be found in the Care Plan section)  Acute Rehab PT Goals Patient Stated Goal: to go to rehab PT Goal Formulation: With patient Time For Goal Achievement: 07/13/19 Potential to Achieve Goals: Fair    Frequency Min  2X/week   Barriers to discharge Decreased caregiver support      Co-evaluation               AM-PAC PT "6 Clicks" Mobility  Outcome Measure Help needed turning from your back to your side while in a flat bed without using bedrails?: None Help needed moving from lying on your back to sitting on the side of a flat bed without using bedrails?: None Help needed moving to and from a bed to a chair (including a wheelchair)?: A Lot Help needed standing up from a chair using your arms (e.g., wheelchair or bedside chair)?: A Lot Help needed to walk in hospital room?: A Lot Help needed climbing 3-5 steps with a railing? : A Lot 6 Click Score: 16    End of Session   Activity Tolerance: Patient limited by pain Patient left: in bed;with call bell/phone within reach;with family/visitor present Nurse Communication: Mobility status PT Visit Diagnosis: Muscle weakness (generalized) (M62.81);Difficulty in walking, not elsewhere classified (R26.2);Pain Pain - Right/Left: (bilateral) Pain - part of body: Ankle and joints of foot    Time: 7106-2694 PT Time Calculation (min) (ACUTE ONLY): 11 min   Charges:   PT Evaluation $PT Eval Moderate Complexity: 1 Mod           Reuel Derby, PT, DPT  Acute Rehabilitation Services  Pager: [phone] Office: [phone]   Rudean Hitt 06/29/2019, 6:26 PM

## 2019-06-29 NOTE — Consult Note (Signed)
Reason for Consult: To manage dialysis and dialysis related needs Referring Physician: EDP  JACKELYNE SAYER is an 69 y.o. female.   HPI: Pt is a 53F with a PMH sig for ESRD on HD TTS at [facility], SBO, Afib, mesenteric ischemia s/p SMA stenting, and recent admit for hematemesis who is now seen in consultation at the request of Dr.Goldston for eval and recs re: management of ESRD and provision of HD.  Pt was recently discharged 5/24 from [facility] for hematemesis.  EGD revealed large duodenal ulcers.  Was continued on PPI.  Note that she has had multiple hospitalizations over multiple health systems in the past 6 months.    She comes in today for pain from her rectal prolapse.  Described as 10/10.  Didn't go to HD 5/25.  Went to HD 5/27.  Hgb was 9.0 then.  She has missed her 2 subsequent HD sessions.  Hgb was 6.8 in the ED.  DRE by EDP revealed no gross blood.  Hemoccult pending.    Pt reports that her rectal prolapse and her legs hurt.  Has been seen by [facility] in ED.    Dialyzes at [facility] TTS 4 hr EDW 48 kg F180 BFR 350 mL/ min DFR A1.5 UF profile 2 Venofer 100 mg q rx x 9, started 5/27 Mircera 200 q 2 weeks, last given 06/02/19 Calcitriol 1.0 cmg q rx   Past Medical History:  Diagnosis Date  . Anemia   . Anxiety   . Arthritis   . Asthma   . Atrial fibrillation ([place])   . Chronic kidney disease    Dialysis T/Th/Sa  started in March 2020  . COPD (chronic obstructive pulmonary disease) ([place])   . Coronary artery disease    2 stents  . Depression   . GERD (gastroesophageal reflux disease)   . GI bleeding 06/15/2019  . Gout   . Headache    migraines  . History of kidney stones   . Hyperlipidemia   . Hypertension   . Pneumonia   . Small bowel obstruction [facility])     Past Surgical History:  Procedure Laterality Date  . ABDOMINAL HYSTERECTOMY    . ABDOMINAL SURGERY     for small bowel obstruction - x 2  . APPENDECTOMY    . AV FISTULA PLACEMENT Left 08/04/2018   Procedure:  ARTERIOVENOUS (AV) FISTULA CREATION LEFT ARM;  Surgeon: Waynetta Sandy, MD;  Location: [place];  Service: Vascular;  Laterality: Left;  . BASCILIC VEIN TRANSPOSITION Left 11/24/2018   Procedure: SECOND STAGE BASILIC VEIN TRANSPOSITION LEFT ARM;  Surgeon: Waynetta Sandy, MD;  Location: [place];  Service: Vascular;  Laterality: Left;  . BIOPSY  03/26/2019   Procedure: BIOPSY;  Surgeon: Lavena Bullion, DO;  Location: [place];  Service: Gastroenterology;;  . CARDIAC CATHETERIZATION    . CORONARY ANGIOPLASTY  ?2003/2004  . ESOPHAGOGASTRODUODENOSCOPY N/A 06/16/2019   Procedure: ESOPHAGOGASTRODUODENOSCOPY (EGD);  Surgeon: [name], MD;  Location: [facility] ENDOSCOPY;  Service: Endoscopy;  Laterality: N/A;  . ESOPHAGOGASTRODUODENOSCOPY (EGD) WITH PROPOFOL N/A 03/26/2019   Procedure: ESOPHAGOGASTRODUODENOSCOPY (EGD) WITH PROPOFOL;  Surgeon: Lavena Bullion, DO;  Location: [place];  Service: Gastroenterology;  Laterality: N/A;  . FACIAL RECONSTRUCTION SURGERY     x 2  . HERNIA REPAIR    . PERIPHERAL VASCULAR INTERVENTION  04/27/2019   Procedure: PERIPHERAL VASCULAR INTERVENTION;  Surgeon: Marty Heck, MD;  Location: [place] CV LAB;  Service: Cardiovascular;;  SMA  . VISCERAL  ANGIOGRAPHY N/A 04/27/2019   Procedure: MESENTERIC ANGIOGRAPHY;  Surgeon: Marty Heck, MD;  Location: [place] CV LAB;  Service: Cardiovascular;  Laterality: N/A;    Family History  Problem Relation Age of Onset  . Ulcers Sister        sick a long time, improved after surgery    Social History:  reports that she quit smoking about 2 years ago. She has a 25.00 pack-year smoking history. She has never used smokeless tobacco. She reports previous alcohol use. She reports previous drug use. Drug: Marijuana.  Allergies:  Allergies  Allergen Reactions  . Betaine Anaphylaxis  . Cyclobenzaprine Anaphylaxis and Other (See Comments)    "stopped heart"   . Morphine Anaphylaxis     "stopped heart"  . Penicillins Shortness Of Breath, Swelling and Palpitations    Did it involve swelling of the face/tongue/throat, SOB, or low BP? Yes Did it involve sudden or severe rash/hives, skin peeling, or any reaction on the inside of your mouth or nose? No Did you need to seek medical attention at a hospital or doctor's office? No When did it last happen?years  If all above answers are "NO", may proceed with cephalosporin use.   . Ambien [Zolpidem] Other (See Comments)    Severe confusion  . Codeine Itching and Rash  . Hydromorphone Other (See Comments)    If administered quickly, felt like hand was "exploding"     Medications:  Scheduled: . sodium chloride   Intravenous Once  . allopurinol  100 mg Oral Daily  . amiodarone  200 mg Oral BID  . aspirin EC  81 mg Oral Daily  . [START ON 06/30/2019] Chlorhexidine Gluconate Cloth  6 each Topical Q0600  . cholecalciferol  2,000 Units Oral Daily  . clopidogrel  75 mg Oral Daily  . dicyclomine  10 mg Oral QHS  . docusate sodium  100 mg Oral BID  . [START ON 06/30/2019] ferric citrate  630 mg Oral TID WC  . isosorbide mononitrate  30 mg Oral QHS  . metoprolol tartrate  25 mg Oral BID  . mometasone-formoterol  2 puff Inhalation BID  . pantoprazole  40 mg Oral BID  . pravastatin  80 mg Oral QPM  . sucralfate  1 g Oral Q6H     Results for orders placed or performed during the hospital encounter of 06/29/19 (from the past 48 hour(s))  Comprehensive metabolic panel     Status: Abnormal   Collection Time: 06/29/19  4:10 PM  Result Value Ref Range   Sodium 138 135 - 145 mmol/L   Potassium 5.0 3.5 - 5.1 mmol/L   Chloride 103 98 - 111 mmol/L   CO2 17 (L) 22 - 32 mmol/L   Glucose, Bld 101 (H) 70 - 99 mg/dL    Comment: Glucose reference range applies only to samples taken after fasting for at least 8 hours.   BUN 91 (H) 8 - 23 mg/dL   Creatinine, Ser 7.39 (H) 0.44 - 1.00 mg/dL   Calcium 8.1 (L) 8.9 - 10.3 mg/dL   Total  Protein 4.9 (L) 6.5 - 8.1 g/dL   Albumin 2.2 (L) 3.5 - 5.0 g/dL   AST 15 15 - 41 U/L   ALT 11 0 - 44 U/L   Alkaline Phosphatase 43 38 - 126 U/L   Total Bilirubin 0.7 0.3 - 1.2 mg/dL   GFR calc non Af Amer 5 (L) >60 mL/min   GFR calc Af Amer 6 (L) >60 mL/min  Anion gap 18 (H) 5 - 15    Comment: Performed at [facility], [place] [address], [place], [place] [address]  CBC with Differential     Status: Abnormal   Collection Time: 06/29/19  4:10 PM  Result Value Ref Range   WBC 6.3 4.0 - 10.5 K/uL   RBC 2.39 (L) 3.87 - 5.11 MIL/uL   Hemoglobin 6.8 (LL) 12.0 - 15.0 g/dL    Comment: This critical result has verified and been called to Merita Norton RN by Remus Loffler on 06 02 2021 at 1648, and has been read back.  REPEATED TO VERIFY CORRECTED ON 06/02 AT 1846: PREVIOUSLY REPORTED AS 6.8 This critical result has verified and been called to Merita Norton RN by Remus Loffler on 06 02 2021 at 1648, and has been read back.     HCT 22.5 (L) 36.0 - 46.0 %   MCV 94.1 80.0 - 100.0 fL   MCH 28.5 26.0 - 34.0 pg   MCHC 30.2 30.0 - 36.0 g/dL   RDW 17.6 (H) 11.5 - 15.5 %   Platelets 261 150 - 400 K/uL   nRBC 0.0 0.0 - 0.2 %   Neutrophils Relative % 74 %   Neutro Abs 4.7 1.7 - 7.7 K/uL   Lymphocytes Relative 14 %   Lymphs Abs 0.9 0.7 - 4.0 K/uL   Monocytes Relative 8 %   Monocytes Absolute 0.5 0.1 - 1.0 K/uL   Eosinophils Relative 2 %   Eosinophils Absolute 0.1 0.0 - 0.5 K/uL   Basophils Relative 1 %   Basophils Absolute 0.1 0.0 - 0.1 K/uL   Immature Granulocytes 1 %   Abs Immature Granulocytes 0.03 0.00 - 0.07 K/uL    Comment: Performed at [place] [address], [place], [place] [address]  SARS Coronavirus 2 by RT PCR (hospital order, performed in [facility] hospital lab) Nasopharyngeal Nasopharyngeal Swab     Status: None   Collection Time: 06/29/19  5:01 PM   Specimen: Nasopharyngeal Swab  Result Value Ref Range   SARS Coronavirus 2 NEGATIVE NEGATIVE    Comment:  (NOTE) SARS-CoV-2 target nucleic acids are NOT DETECTED. The SARS-CoV-2 RNA is generally detectable in upper and lower respiratory specimens during the acute phase of infection. The lowest concentration of SARS-CoV-2 viral copies this assay can detect is 250 copies / mL. A negative result does not preclude SARS-CoV-2 infection and should not be used as the sole basis for treatment or other patient management decisions.  A negative result may occur with improper specimen collection / handling, submission of specimen other than nasopharyngeal swab, presence of viral mutation(s) within the areas targeted by this assay, and inadequate number of viral copies (<250 copies / mL). A negative result must be combined with clinical observations, patient history, and epidemiological information. Fact Sheet for Patients:   StrictlyIdeas.no Fact Sheet for Healthcare Providers: BankingDealers.co.za This test is not yet approved or cleared  by the [place] FDA and has been authorized for detection and/or diagnosis of SARS-CoV-2 by FDA under an Emergency Use Authorization (EUA).  This EUA will remain in effect (meaning this test can be used) for the duration of the COVID-19 declaration under Section 564(b)(1) of the Act, 21 U.S.C. section 360bbb-3(b)(1), unless the authorization is terminated or revoked sooner. Performed at [facility], [place] [address], [place], [place] [address]     No results found.  ROS: All other systems reviewed and are negative except as per  HPI Blood pressure (!) 177/81, pulse 70, temperature 98 F (36.7 C), temperature source Oral, resp. rate 15, SpO2 97 %. . GEN chronically ill-appearing NAD HEENT EOMI PERRL poor dentition NECK no JVD PULM muffled bibasilar sounds CV irregular no m/r/g ABD soft NABS nontender to palpation EXT 1+ pitting edema, some rubor and blistering bilateral toes NEURO AAO x 3 ACCESS: R  IJ TDC and LUE AVF good T/B  Assessment/Plan: 1 Rectal prolapse pain 2.  Anemia: Hgb 9.0 5/27 --> 6.8 06/29/19.  History of duodenal ulcers.  On PPI.  Getting 1 u PRBCs.  Rest per primary/ potential GI.  Will give iron with dialysis and give aranesp too. 3 ESRD: TTS--> will provide here.  HD tonight.  Will use TDC- pt has reported difficulty with her AVF 4 Hypertension: Expect to improve with UF 5. Metabolic Bone Disease: binders when eatin, calcitriol q rx  6.  Nutrition: albumin 2.2--> recommend prostat  Madelon Lips 06/29/2019, 7:15 PM

## 2019-06-29 NOTE — ED Triage Notes (Signed)
Pt arrives to ED in 10/10 pain from rectal prolapse- pt states the prolapse has been there for over 4 days, also missed her dialysis yesterday.

## 2019-06-29 NOTE — ED Provider Notes (Signed)
[facility] EMERGENCY DEPARTMENT Provider Note   CSN: [id number] Arrival date & time: 06/29/19  1313     History Chief Complaint  Patient presents with  . Rectal Problems    Christina [name] is a 69 y.o. female.  HPI 69 year old female with extensive medical history including hypertension, CAD, ESRD on dialysis, COPD, gout, A. fib presents to the ER with complaints of rectal prolapse and increasing pain to her toes bilaterally.   Patient refers that she was admitted to the hospital on 06/14/2019 for acute abdominal pain and A. fib with RVR, where a rectal prolapse was noted.  She states that her rectum prolapsing in approximately 4 days ago, and has been extremely painful.  She has still been able to urinate and defecate.  She states that she missed dialysis yesterday secondary to the pain.  She also notes a significant increase in pain in her toes bilaterally.  She states that she is followed by Dr. Oneida Alar with vascular, and there have been discussions of amputating her toes.  She denies any fevers, chills, increasing redness or swelling.  She denies any chest pain, shortness of breath, nausea, vomiting, abdominal pain, fevers, chills.  She states that her toes feel numb but this is not more than her baseline.  She also states she noticed approximately a teaspoon size of blood once on her toilet paper while wiping.   Past Medical History:  Diagnosis Date  . Anemia   . Anxiety   . Arthritis   . Asthma   . Atrial fibrillation ([place])   . Chronic kidney disease    Dialysis T/Th/Sa  started in March 2020  . COPD (chronic obstructive pulmonary disease) ([place])   . Coronary artery disease    2 stents  . Depression   . GERD (gastroesophageal reflux disease)   . GI bleeding 06/15/2019  . Gout   . Headache    migraines  . History of kidney stones   . Hyperlipidemia   . Hypertension   . Pneumonia   . Small bowel obstruction [facility])     Patient Active Problem List    Diagnosis Date Noted  . Cerebral embolism with cerebral infarction 06/19/2019  . Atrial fibrillation with rapid ventricular response ([place]) 06/14/2019  . Incomplete rectal prolapse 06/14/2019  . Respiratory failure with hypoxia ([place]) 05/10/2019  . Pneumonia 05/10/2019  . 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 Duodenal ulcer perforation ([place]) 04/23/2019  . Cocaine abuse ([place]) 04/23/2019  . Acute gastric ulcer without hemorrhage or perforation   . Hematemesis 04/04/2019  . Atrial fibrillation with RVR ([place])   . Duodenal ulcer   . Gastritis and gastroduodenitis   . Acute blood loss anemia   . Ischemic bowel disease ([place]) 03/25/2019  . RUQ pain   . Anemia due to chronic kidney disease, on chronic dialysis ([place])   . AF (paroxysmal atrial fibrillation) ([place])   . Chest pain 03/19/2019  . Hyperkalemia 02/28/2019  . Uremia 02/27/2019  . Volume overload 02/21/2019  . Abdominal pain 02/21/2019  . Non-compliance with renal dialysis ([place]) 02/21/2019  . Respiratory failure ([place]) 02/04/2019  . Acute and chr resp failure, unsp w hypoxia or hypercapnia ([place])   . Afib ([place]) 01/25/2019  . SBO (small bowel obstruction) ([place]) 08/10/2018  . Anemia due to chronic blood loss   . Heme positive stool   . Platelet inhibition due to Plavix   . Acute GI bleeding 05/06/2018  . ESRD (end stage renal disease) (  [place]) 05/06/2018  . Anxiety 05/06/2018  . Bipolar affective ([place]) 05/06/2018  . CAD (coronary artery disease), native coronary artery 05/06/2018  . COPD with chronic bronchitis ([place]) 05/06/2018  . CVA (cerebral vascular accident) ([place]) 05/06/2018  . Essential hypertension 05/06/2018  . Hyperlipidemia 05/06/2018    Past Surgical History:  Procedure Laterality Date  . ABDOMINAL HYSTERECTOMY    . ABDOMINAL SURGERY     for small bowel obstruction - x 2  . APPENDECTOMY    . AV FISTULA PLACEMENT Left 08/04/2018   Procedure: ARTERIOVENOUS (AV) FISTULA CREATION LEFT ARM;  Surgeon: Waynetta Sandy, MD;  Location: [facility];  Service: Vascular;  Laterality: Left;  . BASCILIC VEIN TRANSPOSITION Left 11/24/2018   Procedure: SECOND STAGE BASILIC VEIN TRANSPOSITION LEFT ARM;  Surgeon: Waynetta Sandy, MD;  Location: [facility];  Service: Vascular;  Laterality: Left;  . BIOPSY  03/26/2019   Procedure: BIOPSY;  Surgeon: Lavena Bullion, DO;  Location: [facility];  Service: Gastroenterology;;  . CARDIAC CATHETERIZATION    . CORONARY ANGIOPLASTY  ?2003/2004  . ESOPHAGOGASTRODUODENOSCOPY N/A 06/16/2019   Procedure: ESOPHAGOGASTRODUODENOSCOPY (EGD);  Surgeon: [name], MD;  Location: [facility] ENDOSCOPY;  Service: Endoscopy;  Laterality: N/A;  . ESOPHAGOGASTRODUODENOSCOPY (EGD) WITH PROPOFOL N/A 03/26/2019   Procedure: ESOPHAGOGASTRODUODENOSCOPY (EGD) WITH PROPOFOL;  Surgeon: Lavena Bullion, DO;  Location: [facility];  Service: Gastroenterology;  Laterality: N/A;  . FACIAL RECONSTRUCTION SURGERY     x 2  . HERNIA REPAIR    . PERIPHERAL VASCULAR INTERVENTION  04/27/2019   Procedure: PERIPHERAL VASCULAR INTERVENTION;  Surgeon: Marty Heck, MD;  Location: [facility] CV LAB;  Service: Cardiovascular;;  SMA  . VISCERAL ANGIOGRAPHY N/A 04/27/2019   Procedure: MESENTERIC ANGIOGRAPHY;  Surgeon: Marty Heck, MD;  Location: [facility];  Service: Cardiovascular;  Laterality: N/A;     OB History   No obstetric history on file.     Family History  Problem Relation Age of Onset  . Ulcers Sister        sick a long time, improved after surgery    Social History   Tobacco Use  . Smoking status: Former Smoker    Packs/day: 0.50    Years: 50.00    Pack years: 25.00    Quit date: 2019    Years since quitting: 2.4  . Smokeless tobacco: Never Used  Substance Use Topics  . Alcohol use: Not Currently    Comment: quit 2019 - "I was a drunk"  . Drug use: Not Currently    Types: Marijuana    Comment: h/o drug use - "I was raised in the 60s"; last  use 18 months ago; smokes marijuana periodically    Home Medications Prior to Admission medications   Medication Sig Start Date End Date Taking? Authorizing Provider  albuterol (PROVENTIL) (2.5 MG/3ML) 0.083% nebulizer solution Take 2.5 mg by nebulization every 6 (six) hours as needed for wheezing or shortness of breath.    [provider]  allopurinol (ZYLOPRIM) 100 MG tablet Take 100 mg by mouth daily. 02/18/18   [provider]  amiodarone (PACERONE) 200 MG tablet Take 1 tablet (200 mg total) by mouth 2 (two) times daily. 06/20/19   Georgette Shell, MD  aspirin 81 MG EC tablet Take 81 mg by mouth daily. 05/12/19   [provider]  budesonide-formoterol (SYMBICORT) 160-4.5 MCG/ACT inhaler Inhale 2 puffs into the lungs every 4 (four) hours as needed (shortness of breath/wheezing).  [provider]  Cholecalciferol (VITAMIN D3) 50 MCG (2000 UT) capsule Take 2,000 Units by mouth daily.     [provider]  clobetasol (TEMOVATE) 0.05 % external solution Apply 1 application topically daily as needed (psoriasis).  04/22/19   [provider]  clopidogrel (PLAVIX) 75 MG tablet Take 75 mg by mouth daily. 06/09/19   [provider]  dicyclomine (BENTYL) 10 MG capsule Take 10 mg by mouth at bedtime.  08/27/17   [provider]  diphenhydrAMINE (BENADRYL) 25 MG tablet Take 25 mg by mouth at bedtime as needed for sleep.     [provider]  docusate sodium (COLACE) 100 MG capsule Take 1 capsule (100 mg total) by mouth 2 (two) times daily. Patient taking differently: Take 300 mg by mouth daily as needed (constipation).  08/11/18   Hongalgi, Lenis Dickinson, MD  ferric citrate (AURYXIA) 1 GM 210 MG(Fe) tablet Take 630 mg by mouth 3 (three) times daily with meals. 2 tablets with a snack = 420mg     [provider]  Ipratropium-Albuterol (COMBIVENT) 20-100 MCG/ACT AERS respimat Inhale 2 puffs into the lungs 4 (four) times daily as  needed for wheezing. 09/11/17   [provider]  isosorbide mononitrate (IMDUR) 30 MG 24 hr tablet Take 30 mg by mouth at bedtime.  08/17/18   [provider]  lidocaine-prilocaine (EMLA) cream Apply 1 application topically daily as needed (pain at the site of port access).  09/03/18   [provider]  metoprolol tartrate (LOPRESSOR) 50 MG tablet Take 0.5 tablets (25 mg total) by mouth 2 (two) times daily. 06/20/19 06/19/20  Georgette Shell, MD  nitroGLYCERIN (NITROSTAT) 0.4 MG SL tablet Place 0.4 mg under the tongue every 5 (five) minutes as needed for chest pain.  09/18/16   [provider]  ondansetron (ZOFRAN) 4 MG tablet Take 4 mg by mouth 3 (three) times daily as needed for nausea. 09/14/18   [provider]  pantoprazole (PROTONIX) 40 MG tablet Take 1 tablet (40 mg total) by mouth 2 (two) times daily. 06/20/19   Georgette Shell, MD  polyethylene glycol (MIRALAX / GLYCOLAX) 17 g packet Take 17 g by mouth daily as needed for mild constipation. Patient taking differently: Take 17 g by mouth 2 (two) times daily as needed for mild constipation.  03/30/19   Mercy Riding, MD  pravastatin (PRAVACHOL) 80 MG tablet Take 80 mg by mouth every evening.    [provider]  sennosides-docusate sodium (SENOKOT-S) 8.6-50 MG tablet Take 1 tablet by mouth as needed for constipation.     [provider]  sucralfate (CARAFATE) 1 GM/10ML suspension Take 10 mLs (1 g total) by mouth every 6 (six) hours. 03/30/19 05/29/19  Mercy Riding, MD    Allergies    Betaine, Cyclobenzaprine, Morphine, Penicillins, Ambien [zolpidem], Codeine, and Hydromorphone  Review of Systems   Review of Systems  Constitutional: Negative for chills and fever.  HENT: Negative for ear pain and sore throat.   Eyes: Negative for pain and visual disturbance.  Respiratory: Negative for cough and shortness of breath.   Cardiovascular: Negative for chest pain and palpitations.    Gastrointestinal: Positive for rectal pain. Negative for abdominal pain, diarrhea, nausea and vomiting.  Genitourinary: Negative for dysuria and hematuria.  Musculoskeletal: Negative for arthralgias and back pain.  Skin: Negative for color change and rash.  Neurological: Positive for numbness. Negative for seizures, syncope and weakness.  All other systems reviewed and are negative.  Physical Exam Updated Vital Signs BP (!) 177/81   Pulse 70   Temp 98 F (36.7 C) (Oral)   Resp 15   SpO2 97%   Physical Exam Vitals and nursing note reviewed.  Constitutional:      General: She is not in acute distress.    Appearance: She is well-developed. She is ill-appearing (Chronically ill-appearing). She is not toxic-appearing or diaphoretic.  HENT:     Head: Normocephalic and atraumatic.     Nose: Nose normal.     Mouth/Throat:     Mouth: Mucous membranes are moist.     Pharynx: Oropharynx is clear.  Eyes:     Conjunctiva/sclera: Conjunctivae normal.     Pupils: Pupils are equal, round, and reactive to light.  Cardiovascular:     Rate and Rhythm: Normal rate and regular rhythm.     Pulses: Normal pulses.     Heart sounds: Normal heart sounds. No murmur.  Pulmonary:     Effort: Pulmonary effort is normal. No respiratory distress.     Breath sounds: Normal breath sounds.  Abdominal:     General: Abdomen is flat.     Palpations: Abdomen is soft.     Tenderness: There is no abdominal tenderness.  Genitourinary:    General: Normal vulva.     Rectum: Normal.     Comments: No evidence of prolapse, appears that prolapse itself reduced by the time she got to the ER.  Normal rectal tone, no visible hemorrhoids or fissures.  No evidence of rectal bleeding. Musculoskeletal:        General: Tenderness present. No swelling, deformity or signs of injury.     Cervical back: Neck supple.     Right lower leg: No edema.     Left lower leg: No edema.     Comments: Feet bilaterally are mottled,  mildly erythematous.  Pulses identified via Doppler bilaterally.  When palpating toes, patient screamed out in pain and began hyperventilating.  She has some evidence of necrosis on the tip of her left fourth digit.  No wounds, fluctuance, overlying cellulitis.  Skin:    General: Skin is warm and dry.     Findings: Erythema present. No bruising.  Neurological:     General: No focal deficit present.     Mental Status: She is alert and oriented to person, place, and time.     Sensory: No sensory deficit.     Motor: No weakness.  Psychiatric:        Mood and Affect: Mood normal.        Behavior: Behavior normal.     ED Results / Procedures / Treatments   Labs (all labs ordered are listed, but only abnormal results are displayed) Labs Reviewed  COMPREHENSIVE METABOLIC PANEL - Abnormal; Notable for the following components:      Result Value   CO2 17 (*)    Glucose, Bld 101 (*)    BUN 91 (*)    Creatinine, Ser 7.39 (*)    Calcium 8.1 (*)    Total Protein 4.9 (*)    Albumin 2.2 (*)    GFR calc non Af Amer 5 (*)    GFR calc Af Amer 6 (*)    Anion gap 18 (*)    All other components within normal limits  CBC WITH DIFFERENTIAL/PLATELET - Abnormal; Notable for the following components:   RBC 2.39 (*)    Hemoglobin 6.8 (*)    HCT 22.5 (*)    RDW  17.6 (*)    All other components within normal limits  SARS CORONAVIRUS 2 BY RT PCR (HOSPITAL ORDER, [facility])  RENAL FUNCTION PANEL  CBC  POC OCCULT BLOOD, ED  TYPE AND SCREEN  PREPARE RBC (CROSSMATCH)    EKG None  Radiology No results found.  Procedures Procedures (including critical care time)  Medications Ordered in ED Medications  0.9 %  sodium chloride infusion (Manually program via Guardrails IV Fluids) (has no administration in time range)  allopurinol (ZYLOPRIM) tablet 100 mg (has no administration in time range)  Vitamin D3 2,000 Units (has no administration in time range)  dicyclomine  (BENTYL) capsule 10 mg (has no administration in time range)  Ipratropium-Albuterol (COMBIVENT) respimat 2 puff (has no administration in time range)  nitroGLYCERIN (NITROSTAT) SL tablet 0.4 mg (has no administration in time range)  albuterol (PROVENTIL) (2.5 MG/3ML) 0.083% nebulizer solution 2.5 mg (has no administration in time range)  senna-docusate (Senokot-S) tablet 1 tablet (has no administration in time range)  docusate sodium (COLACE) capsule 100 mg (has no administration in time range)  isosorbide mononitrate (IMDUR) 24 hr tablet 30 mg (has no administration in time range)  ondansetron (ZOFRAN) tablet 4 mg (has no administration in time range)  lidocaine-prilocaine (EMLA) cream 1 application (has no administration in time range)  mometasone-formoterol (DULERA) 200-5 MCG/ACT inhaler 2 puff (has no administration in time range)  ferric citrate (AURYXIA) tablet 630 mg (has no administration in time range)  sucralfate (CARAFATE) 1 GM/10ML suspension 1 g (has no administration in time range)  polyethylene glycol (MIRALAX / GLYCOLAX) packet 17 g (has no administration in time range)  clobetasol cream (TEMOVATE) 3.32 % 1 application (has no administration in time range)  aspirin EC tablet 81 mg (has no administration in time range)  clopidogrel (PLAVIX) tablet 75 mg (has no administration in time range)  pravastatin (PRAVACHOL) tablet 80 mg (has no administration in time range)  pantoprazole (PROTONIX) EC tablet 40 mg (has no administration in time range)  amiodarone (PACERONE) tablet 200 mg (has no administration in time range)  metoprolol tartrate (LOPRESSOR) tablet 25 mg (has no administration in time range)  diphenhydrAMINE (BENADRYL) capsule 25 mg (has no administration in time range)  Chlorhexidine Gluconate Cloth 2 % PADS 6 each (has no administration in time range)  pentafluoroprop-tetrafluoroeth (GEBAUERS) aerosol 1 application (has no administration in time range)  lidocaine (PF)  (XYLOCAINE) 1 % injection 5 mL (has no administration in time range)  lidocaine-prilocaine (EMLA) cream 1 application (has no administration in time range)  0.9 %  sodium chloride infusion (has no administration in time range)  0.9 %  sodium chloride infusion (has no administration in time range)  heparin injection 1,000 Units (has no administration in time range)  alteplase (CATHFLO ACTIVASE) injection 2 mg (has no administration in time range)  fentaNYL (SUBLIMAZE) injection 100 mcg (100 mcg Intravenous Given 06/29/19 1549)    ED Course  I have reviewed the triage vital signs and the nursing notes.  Pertinent labs & imaging results that were available during my care of the patient were reviewed by me and considered in my medical decision making (see chart for details).  Clinical Course as of Jun 29 1918  Wed Jun 29, 2019  [place]    [MB]    Clinical Course User Index [MB] Lyndel Safe   MDM Rules/Calculators/A&P  69 year old female with rectal prolapse and increasing bilateral toe pain x3 days. On presentation to the ER, the patient is chronically ill-appearing, but nontoxic appearing, with no increased work of breathing, in no acute distress.  She is hypertensive in the ER, oxygen saturation was 90% but on my recheck in the room she was satting at 99%.  Physical exam with no evidence of rectal prolapse, it appears that have self reduced on its own.  No evidence of ischemia to rectum.  Good rectal tone.  Bilateral pedal pulses located on exam.  Left pedal pulse weaker than the right which appears to be unchanged per the patient.  She had ABIs done in March which were normal.  Her feet do look mottled and mildly erythematous, she does have some mild evidence of dry gangrene to the left upper fourth toe.  She is exquisitely tender to palpation of her toes, screaming out in pain at the lightest touch.  Her feet are mildly cooler to the touch.  Given  this, will get basic lab work, EKG, and consult vascular.  She will need referral to general surgery for management of her prolapse.  CMP without significant electrolyte abnormalities, creatinine increased to 7.39 likely to missing dialysis yesterday.  Most notably, her hemoglobin is 6.8 today, chart review from 10 days ago shows a hemoglobin of 7.2.  She has a history of low hemoglobin and needing transfusion, with most recent transfusion on 06/14/19.  I performed a rectal exam on the patient to assess for blood, but was not able to gather any stool. She has not had any vaginal bleeding  Patient was seen by Dr. Donnetta Hutching with vascular surgery., he states that they can follow-up with her in an outpatient setting as these problems seem to be chronic.  I also consulted nephrology, they recommend transfusing the patient, and have placed the patient on the list for dialysis tomorrow.  I also consulted transition of care nurse Mariann Laster, she has set up placement for the patient in a SNF, but she will need to remain as boarder overnight in the ER.  Given this, will transfuse 1 unit of PRBCs, keep her overnight for dialysis tomorrow, and the patient then will be stable for discharge to the SNF.  Home meds reordered.  Signed out patient to Nyoka Cowden, PA-C pending blood transfusion and follow-up with dialysis tomorrow.  Patient has remained hemodynamically stable through the ED course.  Final Clinical Impression(s) / ED Diagnoses Final diagnoses:  Low hemoglobin  Chronic toe pain, bilateral  Rectal prolapse    Rx / DC Orders ED Discharge Orders    None       Lyndel Safe 06/29/19 Teodoro Kil, MD 06/29/19 [phone]

## 2019-06-29 NOTE — Progress Notes (Signed)
Transition of Care [facility]) - Emergency Department Mini Assessment   Patient Details  Name: Christina [name] MRN: [id number] Date of Birth: [birth year]  Transition of Care [facility]) CM/SW Contact:    Christina Living, LCSW Phone Number: 06/29/2019, 7:06 PM   Clinical Narrative: CSW et with Pt and daughter at bedside. Pt states that [facility] never came to the home and that condition has deteriorated.  CSW will use the FL@ from 06/18/2019 to fax out information to possible facilities. Pt and daughter agree to this new plan of action.   ED Mini Assessment: What brought you to the Emergency Department? : Rectal bleeding  Barriers to Discharge: Continued Medical Work up     Means of departure: Not know       Patient Contact and Communications        ,          Patient states their goals for this hospitalization and ongoing recovery are:: Will now accept SNF placement. CSW will use FL2 from 06/18/2019 CMS Medicare.gov Compare Post Acute Care list provided to:: Patient Represenative (must comment)(Daughter Christina [name]) Choice offered to / list presented to : Adult Children  Admission diagnosis:  Rectal prolapse Patient Active Problem List   Diagnosis Date Noted  . Cerebral embolism with cerebral infarction 06/19/2019  . Atrial fibrillation with rapid ventricular response ([place]) 06/14/2019  . Incomplete rectal prolapse 06/14/2019  . Respiratory failure with hypoxia ([place]) 05/10/2019  . Pneumonia 05/10/2019  . 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 Duodenal ulcer perforation ([place]) 04/23/2019  . Cocaine abuse ([place]) 04/23/2019  . Acute gastric ulcer without hemorrhage or perforation   . Hematemesis 04/04/2019  . Atrial fibrillation with RVR ([place])   . Duodenal ulcer   . Gastritis and gastroduodenitis   . Acute blood loss anemia   . Ischemic bowel disease ([place]) 03/25/2019  . RUQ pain   . Anemia due to chronic kidney disease, on chronic dialysis ([place])   . AF  (paroxysmal atrial fibrillation) ([place])   . Chest pain 03/19/2019  . Hyperkalemia 02/28/2019  . Uremia 02/27/2019  . Volume overload 02/21/2019  . Abdominal pain 02/21/2019  . Non-compliance with renal dialysis ([place]) 02/21/2019  . Respiratory failure ([place]) 02/04/2019  . Acute and chr resp failure, unsp w hypoxia or hypercapnia ([place])   . Afib ([place]) 01/25/2019  . SBO (small bowel obstruction) ([place]) 08/10/2018  . Anemia due to chronic blood loss   . Heme positive stool   . Platelet inhibition due to Plavix   . Acute GI bleeding 05/06/2018  . ESRD (end stage renal disease) ([place]) 05/06/2018  . Anxiety 05/06/2018  . Bipolar affective ([place]) 05/06/2018  . CAD (coronary artery disease), native coronary artery 05/06/2018  . COPD with chronic bronchitis ([place]) 05/06/2018  . CVA (cerebral vascular accident) ([place]) 05/06/2018  . Essential hypertension 05/06/2018  . Hyperlipidemia 05/06/2018   PCP:  Christina Schwalbe, PA-C Pharmacy:  No Pharmacies Listed

## 2019-06-29 NOTE — Consult Note (Signed)
[facility] [place]  Patient name: Christina [name] MRN: [id number] DOB: 1950/09/26 Sex: female  HPI: Christina [name] is a 69 y.o. female seen in the emergency department for evaluation of pain in her feet.  She has a extremely complex history.  She actually presented due to a 4-day history of severe rectal prolapse and incontinence and pain.  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  She underwent superior mesenteric artery angioplasty and stenting with Dr. Carlis Abbott on 04/27/2019.  She reports that her abdominal pain has resolved.  She has known lower extremity arterial insufficiency.  She had been seen by Dr. Oneida Alar for this and had been watching conservatively.  She had been placed on Neurontin.  She reports that her foot pain has become increasingly more severe.  This has not been acute and has been progressive however.  She is end-stage renal disease on hemodialysis.  Past Medical History:  Diagnosis Date  . Anemia   . Anxiety   . Arthritis   . Asthma   . Atrial fibrillation ([place])   . Chronic kidney disease    Dialysis T/Th/Sa  started in March 2020  . COPD (chronic obstructive pulmonary disease) ([place])   . Coronary artery disease    2 stents  . Depression   . GERD (gastroesophageal reflux disease)   . GI bleeding 06/15/2019  . Gout   . Headache    migraines  . History of kidney stones   . Hyperlipidemia   . Hypertension   . Pneumonia   . Small bowel obstruction ([facility])     Family History  Problem Relation Age of Onset  . Ulcers Sister        sick a long time, improved after surgery    SOCIAL HISTORY: Social History   Tobacco Use  . Smoking status: Former Smoker    Packs/day: 0.50    Years: 50.00    Pack years: 25.00    Quit date: 2019    Years since quitting: 2.4  . Smokeless tobacco: Never Used  Substance Use Topics  . Alcohol use: Not Currently     Comment: quit 2019 - "I was a drunk"    Allergies  Allergen Reactions  . Betaine Anaphylaxis  . Cyclobenzaprine Anaphylaxis and Other (See Comments)    "stopped heart"   . Morphine Anaphylaxis    "stopped heart"  . Penicillins Shortness Of Breath, Swelling and Palpitations    Did it involve swelling of the face/tongue/throat, SOB, or low BP? Yes Did it involve sudden or severe rash/hives, skin peeling, or any reaction on the inside of your mouth or nose? No Did you need to seek medical attention at a hospital or doctor's office? No When did it last happen?years  If all above answers are "NO", may proceed with cephalosporin use.   . Ambien [Zolpidem] Other (See Comments)    Severe confusion  . Codeine Itching and Rash  . Hydromorphone Other (See Comments)    If administered quickly, felt like hand was "exploding"     No current facility-administered medications for this encounter.   Current Outpatient Medications  Medication Sig Dispense Refill  . albuterol (PROVENTIL) (2.5 MG/3ML) 0.083% nebulizer solution Take 2.5 mg by nebulization every 6 (six) hours as needed for wheezing or shortness of breath.    . allopurinol (ZYLOPRIM) 100 MG tablet Take 100 mg by mouth daily.    Marland Kitchen amiodarone (PACERONE) 200 MG  tablet Take 1 tablet (200 mg total) by mouth 2 (two) times daily. 60 tablet 4  . aspirin 81 MG EC tablet Take 81 mg by mouth daily.    . budesonide-formoterol (SYMBICORT) 160-4.5 MCG/ACT inhaler Inhale 2 puffs into the lungs every 4 (four) hours as needed (shortness of breath/wheezing).    . Cholecalciferol (VITAMIN D3) 50 MCG (2000 UT) capsule Take 2,000 Units by mouth daily.     . clobetasol (TEMOVATE) 0.05 % external solution Apply 1 application topically daily as needed (psoriasis).     . clopidogrel (PLAVIX) 75 MG tablet Take 75 mg by mouth daily.    Marland Kitchen dicyclomine (BENTYL) 10 MG capsule Take 10 mg by mouth at bedtime.     . diphenhydrAMINE (BENADRYL) 25 MG tablet Take 25  mg by mouth at bedtime as needed for sleep.     Marland Kitchen docusate sodium (COLACE) 100 MG capsule Take 1 capsule (100 mg total) by mouth 2 (two) times daily. (Patient taking differently: Take 300 mg by mouth daily as needed (constipation). ) 30 capsule 0  . ferric citrate (AURYXIA) 1 GM 210 MG(Fe) tablet Take 630 mg by mouth 3 (three) times daily with meals. 2 tablets with a snack = 420mg     . Ipratropium-Albuterol (COMBIVENT) 20-100 MCG/ACT AERS respimat Inhale 2 puffs into the lungs 4 (four) times daily as needed for wheezing.    . isosorbide mononitrate (IMDUR) 30 MG 24 hr tablet Take 30 mg by mouth at bedtime.     . lidocaine-prilocaine (EMLA) cream Apply 1 application topically daily as needed (pain at the site of port access).     . metoprolol tartrate (LOPRESSOR) 50 MG tablet Take 0.5 tablets (25 mg total) by mouth 2 (two) times daily. 30 tablet 11  . nitroGLYCERIN (NITROSTAT) 0.4 MG SL tablet Place 0.4 mg under the tongue every 5 (five) minutes as needed for chest pain.     Marland Kitchen ondansetron (ZOFRAN) 4 MG tablet Take 4 mg by mouth 3 (three) times daily as needed for nausea.    . pantoprazole (PROTONIX) 40 MG tablet Take 1 tablet (40 mg total) by mouth 2 (two) times daily. 60 tablet 6  . polyethylene glycol (MIRALAX / GLYCOLAX) 17 g packet Take 17 g by mouth daily as needed for mild constipation. (Patient taking differently: Take 17 g by mouth 2 (two) times daily as needed for mild constipation. ) 14 each 0  . pravastatin (PRAVACHOL) 80 MG tablet Take 80 mg by mouth every evening.    . sennosides-docusate sodium (SENOKOT-S) 8.6-50 MG tablet Take 1 tablet by mouth as needed for constipation.     . sucralfate (CARAFATE) 1 GM/10ML suspension Take 10 mLs (1 g total) by mouth every 6 (six) hours. 1260 mL 1    REVIEW OF SYSTEMS:  [X]  denotes positive finding, [ ]  denotes negative finding Cardiac  Comments:  Chest pain or chest pressure:    Shortness of breath upon exertion:    Short of breath when lying  flat:    Irregular heart rhythm: x       Vascular    Pain in calf, thigh, or hip brought on by ambulation:    Pain in feet at night that wakes you up from your sleep:  x   Blood clot in your veins:    Leg swelling:           PHYSICAL EXAM: Vitals:   06/29/19 1613 06/29/19 1645 06/29/19 1730 06/29/19 1732  BP:    Marland Kitchen)  177/81  Pulse: 69  70   Resp:  (!) 25 15 15   Temp:      TempSrc:      SpO2: 100%  97%     GENERAL: The patient is a well-nourished female, in no acute distress. The vital signs are documented above. CARDIOVASCULAR: Palpable femoral pulses.  Absent popliteal and absent distal pulses.  She does have biphasic flow at the dorsalis pedis and posterior tibial level bilaterally by Doppler. PULMONARY: There is good air exchange  MUSCULOSKELETAL: There are no major deformities or cyanosis. NEUROLOGIC: No focal weakness or paresthesias are detected.  Motor and sensory function intact in her feet SKIN: She does have ruborous changes in her toes and some superficial blistering on her toes as well PSYCHIATRIC: The patient has a normal affect.  DATA:  Noninvasive lower extremity studies from 04/25/2019 revealed normal ankle arm index bilaterally which was probably falsely elevated.  She did have dampened waveforms.  MEDICAL ISSUES: Chronic ischemia both lower extremities with multiple other medical issues.  Her presenting issue was rectal prolapse.  Also found to be severely anemic with a hemoglobin of 6.8.  No evidence of acute change in her ischemic lower extremities.  Will need arteriography for further evaluation.  We will follow with you.    Rosetta Posner, MD FACS [facility] Tel [phone] Pager [phone]

## 2019-06-30 ENCOUNTER — Inpatient Hospital Stay (HOSPITAL_COMMUNITY): Payer: Medicare Other

## 2019-06-30 DIAGNOSIS — Z7902 Long term (current) use of antithrombotics/antiplatelets: Secondary | ICD-10-CM | POA: Diagnosis not present

## 2019-06-30 DIAGNOSIS — M1990 Unspecified osteoarthritis, unspecified site: Secondary | ICD-10-CM | POA: Diagnosis present

## 2019-06-30 DIAGNOSIS — I2510 Atherosclerotic heart disease of native coronary artery without angina pectoris: Secondary | ICD-10-CM | POA: Diagnosis present

## 2019-06-30 DIAGNOSIS — G43909 Migraine, unspecified, not intractable, without status migrainosus: Secondary | ICD-10-CM | POA: Diagnosis present

## 2019-06-30 DIAGNOSIS — I120 Hypertensive chronic kidney disease with stage 5 chronic kidney disease or end stage renal disease: Secondary | ICD-10-CM | POA: Diagnosis present

## 2019-06-30 DIAGNOSIS — K921 Melena: Secondary | ICD-10-CM | POA: Diagnosis not present

## 2019-06-30 DIAGNOSIS — D62 Acute posthemorrhagic anemia: Secondary | ICD-10-CM

## 2019-06-30 DIAGNOSIS — M109 Gout, unspecified: Secondary | ICD-10-CM | POA: Diagnosis present

## 2019-06-30 DIAGNOSIS — Z992 Dependence on renal dialysis: Secondary | ICD-10-CM | POA: Diagnosis not present

## 2019-06-30 DIAGNOSIS — I959 Hypotension, unspecified: Secondary | ICD-10-CM | POA: Diagnosis not present

## 2019-06-30 DIAGNOSIS — Y838 Other surgical procedures as the cause of abnormal reaction of the patient, or of later complication, without mention of misadventure at the time of the procedure: Secondary | ICD-10-CM | POA: Diagnosis not present

## 2019-06-30 DIAGNOSIS — K269 Duodenal ulcer, unspecified as acute or chronic, without hemorrhage or perforation: Secondary | ICD-10-CM | POA: Diagnosis not present

## 2019-06-30 DIAGNOSIS — Z20822 Contact with and (suspected) exposure to covid-19: Secondary | ICD-10-CM | POA: Diagnosis present

## 2019-06-30 DIAGNOSIS — J449 Chronic obstructive pulmonary disease, unspecified: Secondary | ICD-10-CM | POA: Diagnosis present

## 2019-06-30 DIAGNOSIS — E46 Unspecified protein-calorie malnutrition: Secondary | ICD-10-CM | POA: Diagnosis present

## 2019-06-30 DIAGNOSIS — F05 Delirium due to known physiological condition: Secondary | ICD-10-CM | POA: Diagnosis not present

## 2019-06-30 DIAGNOSIS — K449 Diaphragmatic hernia without obstruction or gangrene: Secondary | ICD-10-CM | POA: Diagnosis present

## 2019-06-30 DIAGNOSIS — R64 Cachexia: Secondary | ICD-10-CM | POA: Diagnosis present

## 2019-06-30 DIAGNOSIS — D500 Iron deficiency anemia secondary to blood loss (chronic): Secondary | ICD-10-CM

## 2019-06-30 DIAGNOSIS — R109 Unspecified abdominal pain: Secondary | ICD-10-CM | POA: Diagnosis present

## 2019-06-30 DIAGNOSIS — N2581 Secondary hyperparathyroidism of renal origin: Secondary | ICD-10-CM | POA: Diagnosis present

## 2019-06-30 DIAGNOSIS — Z681 Body mass index (BMI) 19 or less, adult: Secondary | ICD-10-CM | POA: Diagnosis not present

## 2019-06-30 DIAGNOSIS — K922 Gastrointestinal hemorrhage, unspecified: Secondary | ICD-10-CM | POA: Diagnosis not present

## 2019-06-30 DIAGNOSIS — E785 Hyperlipidemia, unspecified: Secondary | ICD-10-CM | POA: Diagnosis present

## 2019-06-30 DIAGNOSIS — I70221 Atherosclerosis of native arteries of extremities with rest pain, right leg: Secondary | ICD-10-CM | POA: Diagnosis not present

## 2019-06-30 DIAGNOSIS — I70245 Atherosclerosis of native arteries of left leg with ulceration of other part of foot: Secondary | ICD-10-CM | POA: Diagnosis not present

## 2019-06-30 DIAGNOSIS — K2100 Gastro-esophageal reflux disease with esophagitis, without bleeding: Secondary | ICD-10-CM | POA: Diagnosis present

## 2019-06-30 DIAGNOSIS — Z66 Do not resuscitate: Secondary | ICD-10-CM | POA: Diagnosis present

## 2019-06-30 DIAGNOSIS — N186 End stage renal disease: Secondary | ICD-10-CM | POA: Diagnosis present

## 2019-06-30 DIAGNOSIS — I480 Paroxysmal atrial fibrillation: Secondary | ICD-10-CM | POA: Diagnosis present

## 2019-06-30 DIAGNOSIS — D649 Anemia, unspecified: Secondary | ICD-10-CM | POA: Diagnosis not present

## 2019-06-30 DIAGNOSIS — L97529 Non-pressure chronic ulcer of other part of left foot with unspecified severity: Secondary | ICD-10-CM | POA: Diagnosis present

## 2019-06-30 DIAGNOSIS — D631 Anemia in chronic kidney disease: Secondary | ICD-10-CM | POA: Diagnosis present

## 2019-06-30 DIAGNOSIS — K264 Chronic or unspecified duodenal ulcer with hemorrhage: Secondary | ICD-10-CM | POA: Diagnosis present

## 2019-06-30 DIAGNOSIS — K2970 Gastritis, unspecified, without bleeding: Secondary | ICD-10-CM | POA: Diagnosis present

## 2019-06-30 DIAGNOSIS — K551 Chronic vascular disorders of intestine: Secondary | ICD-10-CM | POA: Diagnosis not present

## 2019-06-30 DIAGNOSIS — Z8719 Personal history of other diseases of the digestive system: Secondary | ICD-10-CM | POA: Diagnosis not present

## 2019-06-30 DIAGNOSIS — I739 Peripheral vascular disease, unspecified: Secondary | ICD-10-CM | POA: Diagnosis not present

## 2019-06-30 LAB — RENAL FUNCTION PANEL
Albumin: 2 g/dL — ABNORMAL LOW (ref 3.5–5.0)
Anion gap: 11 (ref 5–15)
BUN: 110 mg/dL — ABNORMAL HIGH (ref 8–23)
CO2: 18 mmol/L — ABNORMAL LOW (ref 22–32)
Calcium: 7.7 mg/dL — ABNORMAL LOW (ref 8.9–10.3)
Chloride: 108 mmol/L (ref 98–111)
Creatinine, Ser: 7.25 mg/dL — ABNORMAL HIGH (ref 0.44–1.00)
GFR calc Af Amer: 6 mL/min — ABNORMAL LOW (ref >60)
GFR calc non Af Amer: 5 mL/min — ABNORMAL LOW (ref >60)
Glucose, Bld: 99 mg/dL (ref 70–99)
Phosphorus: 8.3 mg/dL — ABNORMAL HIGH (ref 2.5–4.6)
Potassium: 5.5 mmol/L — ABNORMAL HIGH (ref 3.5–5.1)
Sodium: 137 mmol/L (ref 135–145)

## 2019-06-30 LAB — CBC
HCT: 23.7 % — ABNORMAL LOW (ref 36.0–46.0)
HCT: 29.2 % — ABNORMAL LOW (ref 36.0–46.0)
Hemoglobin: 7.2 g/dL — ABNORMAL LOW (ref 12.0–15.0)
Hemoglobin: 9.8 g/dL — ABNORMAL LOW (ref 12.0–15.0)
MCH: 28.3 pg (ref 26.0–34.0)
MCH: 29.8 pg (ref 26.0–34.0)
MCHC: 30.4 g/dL (ref 30.0–36.0)
MCHC: 33.6 g/dL (ref 30.0–36.0)
MCV: 93.3 fL (ref 80.0–100.0)
MCV: 88.8 fL (ref 80.0–100.0)
Platelets: 257 10*3/uL (ref 150–400)
Platelets: 166 10*3/uL (ref 150–400)
RBC: 2.54 MIL/uL — ABNORMAL LOW (ref 3.87–5.11)
RBC: 3.29 MIL/uL — ABNORMAL LOW (ref 3.87–5.11)
RDW: 16.6 % — ABNORMAL HIGH (ref 11.5–15.5)
RDW: 14.9 % (ref 11.5–15.5)
WBC: 6.1 10*3/uL (ref 4.0–10.5)
WBC: 9.8 10*3/uL (ref 4.0–10.5)
nRBC: 0 % (ref 0.0–0.2)
nRBC: 0 % (ref 0.0–0.2)

## 2019-06-30 LAB — PREPARE RBC (CROSSMATCH)

## 2019-06-30 LAB — MRSA PCR SCREENING: MRSA by PCR: NEGATIVE

## 2019-06-30 LAB — HEMOGLOBIN AND HEMATOCRIT, BLOOD
HCT: 26.2 % — ABNORMAL LOW (ref 36.0–46.0)
Hemoglobin: 8.8 g/dL — ABNORMAL LOW (ref 12.0–15.0)

## 2019-06-30 MED ORDER — HEPARIN SODIUM (PORCINE) 1000 UNIT/ML IJ SOLN
INTRAMUSCULAR | Status: AC
  Administered 2019-06-30: 3200 [IU]
  Filled 2019-06-30: qty 4

## 2019-06-30 MED ORDER — ACETAMINOPHEN 650 MG RE SUPP: 650.0000 mg | Freq: Four times a day (QID) | RECTAL | Status: DC | PRN

## 2019-06-30 MED ORDER — CALCITRIOL 0.5 MCG PO CAPS
ORAL_CAPSULE | ORAL | Status: AC
  Administered 2019-06-30: 1 ug via ORAL
  Filled 2019-06-30: qty 2

## 2019-06-30 MED ORDER — ACETAMINOPHEN 325 MG PO TABS
650.0000 mg | ORAL_TABLET | Freq: Four times a day (QID) | ORAL | Status: DC | PRN
  Administered 2019-07-02 (×2): 650 mg via ORAL
  Filled 2019-06-30 (×4): qty 2

## 2019-06-30 MED ORDER — PANTOPRAZOLE SODIUM 40 MG IV SOLR
40.0000 mg | Freq: Two times a day (BID) | INTRAVENOUS | Status: DC
  Administered 2019-07-01: 40 mg via INTRAVENOUS
  Filled 2019-06-30: qty 40

## 2019-06-30 MED ORDER — FENTANYL CITRATE (PF) 100 MCG/2ML IJ SOLN
25.0000 ug | INTRAMUSCULAR | Status: DC | PRN
  Administered 2019-06-30 – 2019-07-01 (×11): 25 ug via INTRAVENOUS
  Filled 2019-06-30 (×11): qty 2

## 2019-06-30 MED ORDER — FUROSEMIDE 10 MG/ML IJ SOLN
20.0000 mg | Freq: Once | INTRAMUSCULAR | Status: AC
  Administered 2019-06-30: 20 mg via INTRAVENOUS
  Filled 2019-06-30: qty 2

## 2019-06-30 MED ORDER — OXYCODONE-ACETAMINOPHEN 5-325 MG PO TABS
1.0000 | ORAL_TABLET | Freq: Once | ORAL | Status: AC
  Administered 2019-06-30: 1 via ORAL
  Filled 2019-06-30: qty 1

## 2019-06-30 MED ORDER — SODIUM CHLORIDE 0.9% IV SOLUTION: Freq: Once | INTRAVENOUS | Status: DC

## 2019-06-30 MED ORDER — LIDOCAINE HCL URETHRAL/MUCOSAL 2 % EX GEL
1.0000 "application " | Freq: Once | CUTANEOUS | Status: AC
  Administered 2019-07-01: 1 via TOPICAL
  Filled 2019-06-30 (×2): qty 5

## 2019-06-30 MED ORDER — ALBUTEROL SULFATE (2.5 MG/3ML) 0.083% IN NEBU
2.5000 mg | INHALATION_SOLUTION | RESPIRATORY_TRACT | Status: DC | PRN
Start: 2019-06-30 — End: 2019-06-30

## 2019-06-30 MED ORDER — CALCITRIOL 0.25 MCG PO CAPS
1.0000 ug | ORAL_CAPSULE | ORAL | Status: DC
  Administered 2019-07-01 – 2019-07-04 (×2): 1 ug via ORAL
  Filled 2019-06-30 (×3): qty 4

## 2019-06-30 MED ORDER — SODIUM CHLORIDE 0.9% FLUSH
3.0000 mL | Freq: Two times a day (BID) | INTRAVENOUS | Status: DC
  Administered 2019-06-30 – 2019-07-09 (×11): 3 mL via INTRAVENOUS

## 2019-06-30 MED ORDER — METOPROLOL TARTRATE 25 MG PO TABS
25.0000 mg | ORAL_TABLET | Freq: Two times a day (BID) | ORAL | Status: DC
  Administered 2019-06-30 – 2019-07-06 (×12): 25 mg via ORAL
  Filled 2019-06-30 (×13): qty 1

## 2019-06-30 NOTE — Progress Notes (Signed)
[facility] Progress Note   Dialysis Orders: [facility] TTS 4 hr EDW 48 kg F180 BFR 350 mL/ min DFR A1.5 UF profile 2 no heparin Venofer 100 mg q rx x 9, started 5/27 Mircera 200 q 2 weeks, last given 06/02/19 Calcitriol 1.0 cmg q rx  Assessment/Plan: 1. Recurrent GIB - melanoic stools on HD with abdominal pain - avoid low BP transfuse 2 units - have contacted - ED to arrange for admission  2. ESRD - K 5.5 BUN 110 due to missed HD - last 5/27 TTS - off schedule with ^ volume - using TDC today - reviewed notes from center - not clear why we are not using AVF - it looks good - will use next treatment tomorrow- continue to dialyze as long as stable- 3. Anemia - hgb 6.8 > 7.2 - behind on ESA dosing heme neg- redose ESA today- continue course of Fe - recent acute GIB in May with multiple deep duodenal ulcers - PPI indefinitely 4. Secondary hyperparathyroidism - resume calcitriol 5. HTN/volume - excess volume on excame titrating down - goal 3.5 today given hx mesenteric ischemia - keep SBP > 110. EDW was raised from 40.5 > 48 after last admission - post wt prior to last admission was 44 at the most - I think we need to tritrate EDW back down some what. 6. Nutrition - will need renal diet/vits 7. Mesenteric ischemia s/p SMA stent 05/2019 8.  Atrial fibrillation - no anticoag due to GIB - but on plavix - on amiodarone  9. Hx cocaine abuse -  10. 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 PAD - ischemic left 4th and 5th toes -   [place] [phone] 06/30/2019,7:27 AM  LOS: 0 days   Subjective:   Having bloody stools on dialysis. Upper abdominal pain. Diffuse   Objective Vitals:   06/30/19 0430 06/30/19 0545 06/30/19 0600 06/30/19 0615  BP: (!) 142/69 (!) 144/76 (!) 155/72 115/61  Pulse:  64    Resp: 20 13 16 18   Temp:      TempSrc:      SpO2:  100%     Physical Exam General: ill appearing female intermittently  yelling -  Heart: RRR Lungs: dim BS  Abdomen: soft diffuse upper tenderness - liquid maroon stools Extremities: 1+ 2LE edema left toes with petechiae Dialysis Access: patent left upper AVF right IJ TDC being used   Additional Objective Labs: Basic Metabolic Panel: Recent Labs  Lab 06/29/19 1610 06/30/19 0456  NA 138 137  K 5.0 5.5*  CL 103 108  CO2 17* 18*  GLUCOSE 101* 99  BUN 91* 110*  CREATININE 7.39* 7.25*  CALCIUM 8.1* 7.7*  PHOS  --  8.3*   Liver Function Tests: Recent Labs  Lab 06/29/19 1610 06/30/19 0456  AST 15  --   ALT 11  --   ALKPHOS 43  --   BILITOT 0.7  --   PROT 4.9*  --   ALBUMIN 2.2* 2.0*   No results for input(s): LIPASE, AMYLASE in the last 168 hours. CBC: Recent Labs  Lab 06/29/19 1610 06/30/19 0457  WBC 6.3 6.1  NEUTROABS 4.7  --   HGB 6.8* 7.2*  HCT 22.5* 23.7*  MCV 94.1 93.3  PLT 261 257   Blood Culture    Component Value Date/Time   SDES BLOOD RIGHT HAND 06/17/2019 1223   SPECREQUEST  06/17/2019 1223  BOTTLES DRAWN AEROBIC ONLY Blood Culture results may not be optimal due to an inadequate volume of blood received in culture bottles   CULT  06/17/2019 1223    NO GROWTH 5 DAYS Performed at [facility], [place] [address] [place], [place] [address]    REPTSTATUS 06/22/2019 FINAL 06/17/2019 1223    Cardiac Enzymes: No results for input(s): CKTOTAL, CKMB, CKMBINDEX, TROPONINI in the last 168 hours. CBG: No results for input(s): GLUCAP in the last 168 hours. Iron Studies: No results for input(s): IRON, TIBC, TRANSFERRIN, FERRITIN in the last 72 hours. Lab Results  Component Value Date   INR 1.2 06/14/2019   INR 1.1 03/25/2019   INR 0.9 01/26/2019   Studies/Results: No results found. Medications: . sodium chloride    . sodium chloride    . ferric gluconate (FERRLECIT/NULECIT) IV Stopped (06/30/19 0308)   . sodium chloride   Intravenous Once  . allopurinol  100 mg Oral Daily  . amiodarone  200 mg Oral BID  .  aspirin EC  81 mg Oral Daily  . Chlorhexidine Gluconate Cloth  6 each Topical Q0600  . cholecalciferol  2,000 Units Oral Daily  . clopidogrel  75 mg Oral Daily  . darbepoetin (ARANESP) injection - DIALYSIS  200 mcg Intravenous Q Wed-HD  . dicyclomine  10 mg Oral QHS  . docusate sodium  100 mg Oral BID  . ferric citrate  630 mg Oral TID WC  . isosorbide mononitrate  30 mg Oral QHS  . metoprolol tartrate  25 mg Oral BID  . mometasone-formoterol  2 puff Inhalation BID  . pantoprazole  40 mg Oral BID  . pravastatin  80 mg Oral QPM  . sucralfate  1 g Oral Q6H

## 2019-06-30 NOTE — Progress Notes (Signed)
Patient trasfered from ED to [phone] via stretcher; alert and oriented x 4; complaining of pain BLE; 2 IV saline locked in RFA; AV fistula on LUA and HD cath on RU chest; skin intact - necrotic area on right 4th toe and right heel; erythema bilateral feet; redness blanchable buttocks; scab on LFA and bruising BUE. Orient patient to room and unit; gave patient care guide; instructed how to use the call bell and  fall risk precautions. Will continue to monitor the patient.

## 2019-06-30 NOTE — Progress Notes (Signed)
Patient ID: Christina [name], female   DOB: 1950-02-14, 69 y.o.   MRN: [id number] Events noted at hemodialysis with hemodynamic instability and lower GI bleed.  Chronic ischemia of both feet.  Will require further evaluation with angiography if she stabilizes.  Following with you

## 2019-06-30 NOTE — ED Notes (Signed)
Dinner ordered 

## 2019-06-30 NOTE — Progress Notes (Signed)
Dr. Tamala Julian was notified about MEWS score being yellow due to elevated HR; per MD patient's HR was elevated all day and he is aware about patient's high acuity. Patient is complaining of pain bilateral feet and she is receiving Fentanyl IV Q 2 hrs PRN. Will continue to monitor.

## 2019-06-30 NOTE — ED Provider Notes (Signed)
Patient returned to ED post dialysis, with continued rectal bleeding, and plan for admission to hospitalists.   Hospitalists consulted for admission  - discussed pt, they will admit, and also request gi be consulted.   Dr Macky Lower GI service had seen and done EGD within past 2 weeks in hospital - that service was consulted - they will see.  Patient is awake, alert, BP currently normal, no acute pain or distress. Awaiting admission.      Lajean Saver, MD 06/30/19 1153

## 2019-06-30 NOTE — Progress Notes (Signed)
Pt anxious and restless and yelling out prior, throughout and post HD tx. Pt complaining of abdomen pain and pain with defecation. Pt noted to have 2 large stools consisting of blood. Dr. Jonnie Finner and Martha-PA on unit to assess and aware. Given 2 units of PRBCs as ordered. Hypotension noted at beginning of tx, no further UF per Dr. Jonnie Finner. Pt given NS bolus and is effective in increasing BP. Pt is to continue with HD tx as long as pt remains stable. Pt's IV iron found on the bottom of her stretcher under her pocket book at end of tx, order is for M/W/F however there is no documentation to support that pt received yesterday. Reported to primary nurse in ED for follow up for administration.

## 2019-06-30 NOTE — Progress Notes (Addendum)
[facility] Consult: 12:17 PM 06/30/2019  LOS: 0 days    Referring Provider: Dr Lestine Mount in ED. Primary Care Physician:  Maggie Schwalbe, PA-C Primary Gastroenterologist:  Dr Odie Sera.  Seen by [facility] as inpt less than 30 days ago    Reason for Consultation: Bleeding per rectum, dark stools.   HPI: Christina [name] is a 69 y.o. female.  Hx ESRD. CAD, previous cardiac stents. Chronic Plavix. Panic disorder. Rectal prolapse, usually amenable to self reduction.  SBO w medical management 12/2017. 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 Anemia requiring blood transfusion on multiple occasions. PUD with GI bleed ~2009. 08/2017 colonoscopy for polyp surveillance. No recurrent polyps. 08/2017 EGD unable to locate report.  Bleeding duodenal ulcer w blood loss anemia 02/2019. Transfused 2 PRBCs for Hb 5.1. 03/26/2019 EGD, Dr. Bryan Lemma: Grade B esophagitis, 2 cm HH. Mild gastritis. Multiple cratered duodenal ulcers and duodenitis. Largest ulcer 15 mm with pigmented material at base. This was too large for endoscopic intervention. Fortunately she stabilized, bleeding stopped and she did not not require next step of IR vs surgical intervention. CTA 03/26/2019 was concerning for ischemia. Discharged on PPI, sucralfate.  Innumerable [facility] ([facility], now on # 8) as well as [name] and [facility] ED visits w  C/o CGE, anemia, abdominal and foot/toe pain.   CTAP 04/23/19: 18 mm ulcer distal to duodenal bulb. Fluid surrounding this area and extending into region of gallbladder possibly representing impending perforation. 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  Seen again by GI on 06/14/2019 Re: Acute onset lg volume CGE,  dark stools. Upper abdominal pain. Hb 7.1. Pt finally provided history that she had been taking 10-12 ibuprofen a day for at least a month to address pain in her feet.  06/16/19 EGD: Showed multiple deep duodenal ulcers but no active bleeding and no endoscopic therapy required. Ulcers similar to those seen at EGD 02/2019. Biopsies not repeated as February biopsies showed no signs of H. pylori, dysplasia/metaplasia/cancer and current ulcers due to extreme NSAID overuse.  Dr Ardis Hughs advised/reinforced lifetime avoidance of NSAIDs. Had rectal prolapse which was reduced by the hospitalist.  Pt not interested in surgical repair of rectal prolapse. Received 3 PRBCs during admission  Once again back in the ED w painful rectal prolapse.   Prolapse, rectal pain present for at least 4 days. Missed dialysis on 6/1.  Hgb 6.8.  Says stools are always dark, loose and smell awful.  No nausea, eats well.  Not much abd pain, just severe pain in feet.   Has not had surgical eval of prolapse.  However, seen by vascular surgery regarding pain in her feet, LE arterial insufficiency. Vasc surg contemplating arteriography for further evaluation of chronic bil lower extremity ischemia  During dialysis early this morning having melenic (RN confirms black/loose) stools, hypotension which responded to bolus fluids. Received 3 PRBCs thus far, third unit completed at noon today (after Hgb 7.2 at 0500).  Denies familiy hx colorectal cancer, ulcers,  anemia. Pt does not drink liquor/beer/wine, no NSAIDs since prior to admit last month.        Past Medical History:  Diagnosis Date  . Anemia   . Anxiety   . Arthritis   . Asthma   . Atrial fibrillation ([place])   . Chronic kidney disease    Dialysis T/Th/Sa  started in March 2020  . COPD (chronic obstructive pulmonary disease) ([place])   . Coronary artery disease    2 stents  . Depression   . GERD (gastroesophageal reflux disease)   . GI bleeding 06/15/2019  . Gout   . Headache      migraines  . History of kidney stones   . Hyperlipidemia   . Hypertension   . Pneumonia   . Small bowel obstruction [facility])     Past Surgical History:  Procedure Laterality Date  . ABDOMINAL HYSTERECTOMY    . ABDOMINAL SURGERY     for small bowel obstruction - x 2  . APPENDECTOMY    . AV FISTULA PLACEMENT Left 08/04/2018   Procedure: ARTERIOVENOUS (AV) FISTULA CREATION LEFT ARM;  Surgeon: Waynetta Sandy, MD;  Location: [place];  Service: Vascular;  Laterality: Left;  . BASCILIC VEIN TRANSPOSITION Left 11/24/2018   Procedure: SECOND STAGE BASILIC VEIN TRANSPOSITION LEFT ARM;  Surgeon: Waynetta Sandy, MD;  Location: [place];  Service: Vascular;  Laterality: Left;  . BIOPSY  03/26/2019   Procedure: BIOPSY;  Surgeon: Lavena Bullion, DO;  Location: [place];  Service: Gastroenterology;;  . CARDIAC CATHETERIZATION    . CORONARY ANGIOPLASTY  ?2003/2004  . ESOPHAGOGASTRODUODENOSCOPY N/A 06/16/2019   Procedure: ESOPHAGOGASTRODUODENOSCOPY (EGD);  Surgeon: [name], MD;  Location: [facility] ENDOSCOPY;  Service: Endoscopy;  Laterality: N/A;  . ESOPHAGOGASTRODUODENOSCOPY (EGD) WITH PROPOFOL N/A 03/26/2019   Procedure: ESOPHAGOGASTRODUODENOSCOPY (EGD) WITH PROPOFOL;  Surgeon: Lavena Bullion, DO;  Location: [place];  Service: Gastroenterology;  Laterality: N/A;  . FACIAL RECONSTRUCTION SURGERY     x 2  . HERNIA REPAIR    . PERIPHERAL VASCULAR INTERVENTION  04/27/2019   Procedure: PERIPHERAL VASCULAR INTERVENTION;  Surgeon: Marty Heck, MD;  Location: [place] CV LAB;  Service: Cardiovascular;;  SMA  . VISCERAL ANGIOGRAPHY N/A 04/27/2019   Procedure: MESENTERIC ANGIOGRAPHY;  Surgeon: Marty Heck, MD;  Location: [place] CV LAB;  Service: Cardiovascular;  Laterality: N/A;    Prior to Admission medications   Medication Sig Start Date End Date Taking? Authorizing Provider  albuterol (PROVENTIL) (2.5 MG/3ML) 0.083% nebulizer solution Take 2.5 mg  by nebulization every 6 (six) hours as needed for wheezing or shortness of breath.   Yes [provider]  allopurinol (ZYLOPRIM) 100 MG tablet Take 100 mg by mouth daily. 02/18/18  Yes [provider]  amiodarone (PACERONE) 200 MG tablet Take 1 tablet (200 mg total) by mouth 2 (two) times daily. Patient taking differently: Take 200 mg by mouth daily.  06/20/19  Yes Georgette Shell, MD  aspirin 81 MG EC tablet Take 81 mg by mouth daily. 05/12/19  Yes [provider]  budesonide-formoterol (SYMBICORT) 160-4.5 MCG/ACT inhaler Inhale 2 puffs into the lungs every 4 (four) hours as needed (shortness of breath/wheezing).   Yes [provider]  Cholecalciferol (VITAMIN D3) 50 MCG (2000 UT) capsule Take 2,000 Units by mouth daily.    Yes [provider]  clopidogrel (PLAVIX) 75 MG tablet Take 75 mg by mouth daily. 06/09/19  Yes [provider]  dicyclomine (BENTYL) 10 MG capsule  Take 10 mg by mouth at bedtime.  08/27/17  Yes [provider]  diphenhydrAMINE (BENADRYL) 25 MG tablet Take 25 mg by mouth at bedtime as needed for sleep.    Yes [provider]  docusate sodium (COLACE) 100 MG capsule Take 1 capsule (100 mg total) by mouth 2 (two) times daily. Patient taking differently: Take 300 mg by mouth every other day.  08/11/18  Yes Hongalgi, Lenis Dickinson, MD  ferric citrate (AURYXIA) 1 GM 210 MG(Fe) tablet Take 630 mg by mouth 3 (three) times daily with meals. 2 tablets with a snack = 420mg    Yes [provider]  Ipratropium-Albuterol (COMBIVENT) 20-100 MCG/ACT AERS respimat Inhale 2 puffs into the lungs 4 (four) times daily as needed for wheezing. 09/11/17  Yes [provider]  isosorbide mononitrate (IMDUR) 30 MG 24 hr tablet Take 30 mg by mouth at bedtime.  08/17/18  Yes [provider]  lidocaine-prilocaine (EMLA) cream Apply 1 application topically daily as needed (pain at the site of port access).  09/03/18  Yes  [provider]  metoprolol tartrate (LOPRESSOR) 50 MG tablet Take 0.5 tablets (25 mg total) by mouth 2 (two) times daily. Patient taking differently: Take 50 mg by mouth daily.  06/20/19 06/19/20 Yes Georgette Shell, MD  nitroGLYCERIN (NITROSTAT) 0.4 MG SL tablet Place 0.4 mg under the tongue every 5 (five) minutes as needed for chest pain.  09/18/16  Yes [provider]  ondansetron (ZOFRAN) 4 MG tablet Take 4 mg by mouth 3 (three) times daily as needed for nausea. 09/14/18  Yes [provider]  pantoprazole (PROTONIX) 40 MG tablet Take 1 tablet (40 mg total) by mouth 2 (two) times daily. Patient taking differently: Take 40 mg by mouth daily.  06/20/19  Yes Georgette Shell, MD  polyethylene glycol (MIRALAX / GLYCOLAX) 17 g packet Take 17 g by mouth daily as needed for mild constipation. Patient taking differently: Take 17 g by mouth every other day. At night. 03/30/19  Yes Mercy Riding, MD  pravastatin (PRAVACHOL) 80 MG tablet Take 80 mg by mouth every evening.   Yes [provider]  sennosides-docusate sodium (SENOKOT-S) 8.6-50 MG tablet Take 1 tablet by mouth as needed for constipation.    Yes [provider]  sucralfate (CARAFATE) 1 GM/10ML suspension Take 10 mLs (1 g total) by mouth every 6 (six) hours. 03/30/19 05/29/19  Mercy Riding, MD    Scheduled Meds: . sodium chloride   Intravenous Once  . sodium chloride   Intravenous Once  . allopurinol  100 mg Oral Daily  . amiodarone  200 mg Oral BID  . aspirin EC  81 mg Oral Daily  . calcitRIOL  1 mcg Oral Q M,W,F-HD  . Chlorhexidine Gluconate Cloth  6 each Topical Q0600  . cholecalciferol  2,000 Units Oral Daily  . darbepoetin (ARANESP) injection - DIALYSIS  200 mcg Intravenous Q Wed-HD  . dicyclomine  10 mg Oral QHS  . docusate sodium  100 mg Oral BID  . ferric citrate  630 mg Oral TID WC  . isosorbide mononitrate  30 mg Oral QHS  . metoprolol tartrate  25 mg Oral BID  .  mometasone-formoterol  2 puff Inhalation BID  . pantoprazole  40 mg Oral BID  . pravastatin  80 mg Oral QPM  . sodium chloride flush  3 mL Intravenous Q12H  . sucralfate  1 g Oral Q6H   Infusions: . sodium chloride    .  sodium chloride    . ferric gluconate (FERRLECIT/NULECIT) IV Stopped (06/30/19 0308)   PRN Meds: sodium chloride, sodium chloride, acetaminophen **OR** acetaminophen, alteplase, clobetasol cream, diphenhydrAMINE, heparin sodium (porcine), ipratropium-albuterol, lidocaine (PF), lidocaine-prilocaine, lidocaine-prilocaine, nitroGLYCERIN, ondansetron, pentafluoroprop-tetrafluoroeth, polyethylene glycol, senna-docusate   Allergies as of 06/29/2019 - Review Complete 06/29/2019  Allergen Reaction Noted  . Betaine Anaphylaxis 06/01/2019  . Cyclobenzaprine Anaphylaxis and Other (See Comments) 04/09/2015  . Morphine Anaphylaxis 04/09/2015  . Penicillins Shortness Of Breath, Swelling, and Palpitations 04/09/2015  . Ambien [zolpidem] Other (See Comments) 02/23/2019  . Codeine Itching and Rash 04/09/2015  . Hydromorphone Other (See Comments) 05/29/2015    Family History  Problem Relation Age of Onset  . Ulcers Sister        sick a long time, improved after surgery    Social History   Socioeconomic History  . Marital status: Single    Spouse name: Not on file  . Number of children: Not on file  . Years of education: Not on file  . Highest education level: Not on file  Occupational History  . Occupation: retired  Tobacco Use  . Smoking status: Former Smoker    Packs/day: 0.50    Years: 50.00    Pack years: 25.00    Quit date: 2019    Years since quitting: 2.4  . Smokeless tobacco: Never Used  Substance and Sexual Activity  . Alcohol use: Not Currently    Comment: quit 2019 - "I was a drunk"  . Drug use: Not Currently    Types: Marijuana    Comment: h/o drug use - "I was raised in the 60s"; last use 18 months ago; smokes marijuana periodically  . Sexual  activity: Not on file  Other Topics Concern  . Not on file  Social History Narrative  . Not on file   Social Determinants of Health   Financial Resource Strain:   . Difficulty of Paying Living Expenses:   Food Insecurity:   . Worried About Charity fundraiser in the Last Year:   . Arboriculturist in the Last Year:   Transportation Needs:   . Film/video editor (Medical):   Marland Kitchen Lack of Transportation (Non-Medical):   Physical Activity:   . Days of Exercise per Week:   . Minutes of Exercise per Session:   Stress:   . Feeling of Stress :   Social Connections:   . Frequency of Communication with Friends and Family:   . Frequency of Social Gatherings with Friends and Family:   . Attends Religious Services:   . Active Member of Clubs or Organizations:   . Attends Archivist Meetings:   Marland Kitchen Marital Status:   Intimate Partner Violence:   . Fear of Current or Ex-Partner:   . Emotionally Abused:   Marland Kitchen Physically Abused:   . Sexually Abused:     REVIEW OF SYSTEMS: Constitutional: Weakness ENT:  No nose bleeds Pulm: No shortness of breath CV:  No palpitations, no LE edema. No angina GU:  No hematuria, no frequency GI:   Heme: Denies excessive or unusual bleeding or bruising. Transfusions: On multiple occasions. Neuro:  No headaches, no peripheral tingling or numbness.  No syncope, no seizures. Derm: Sores on her heels and toes bilaterally.  No itching, no rash.  Endocrine:  No sweats or chills.  No polyuria or dysuria Immunization: Not queried. Travel:  None beyond local counties in last few months.    PHYSICAL EXAM: Vital signs in last  24 hours: Vitals:   06/30/19 1100 06/30/19 1123  BP: (!) 126/98 (!) 142/90  Pulse: (!) 108 (!) 52  Resp: (!) 24 (!) 29  Temp: 98 F (36.7 C)   SpO2: 98% 98%   Wt Readings from Last 3 Encounters:  06/20/19 50.8 kg  05/12/19 41.9 kg  04/29/19 40.8 kg    General: Cachectic, chronically ill, alert WF.  Uncomfortable due to  foot pain. Head: No facial asymmetry or swelling.  No signs of head trauma. Eyes: No scleral icterus.  No conjunctival pallor.  EOMI. Ears: Not hard of hearing Nose: No discharge Mouth: Poor dentition.  Mucosa pink, moist, clear.  Tongue midline. Neck: No JVD, no masses, no thyromegaly Lungs: Clear bilaterally. Heart: Tacky in the 120s to 130s, A. fib on heart monitor.  No MRG.  S1, S2 present Abdomen: Thin, soft, nontender.  Bowel sounds active.  No bruits, hernias, masses..   Rectal: Nonthrombosed appearing hemorrhoids.  There is a generous palm full of blood on the under pad, it is very dark blackish burgundy colored and smells melenic. Musc/Skeltl: Looks osteopenic. Extremities: No CCE.  Mottled appearance to feet bilaterally with hyperemia.  Ischemic type ulcers in the toes and the heels.  The feet themselves are not cool to the touch. Neurologic: Alert.  Appropriate.  Oriented x3.  Moves all 4 limbs, strength not tested.  No tremors. Skin: No telangiectasia.  Sores on her feet as above. Nodes: No cervical adenopathy Psych: Cooperative, pleasant, fluid speech.  Intake/Output from previous day: 06/02 0701 - 06/03 0700 In: 315 [Blood:315] Out: -  Intake/Output this shift: Total I/O In: 315 [Blood:315] Out: -   LAB RESULTS: Recent Labs    06/29/19 1610 06/30/19 0457  WBC 6.3 6.1  HGB 6.8* 7.2*  HCT 22.5* 23.7*  PLT 261 257   BMET Lab Results  Component Value Date   NA 137 06/30/2019   NA 138 06/29/2019   NA 129 (L) 06/18/2019   K 5.5 (H) 06/30/2019   K 5.0 06/29/2019   K 4.6 06/18/2019   CL 108 06/30/2019   CL 103 06/29/2019   CL 95 (L) 06/18/2019   CO2 18 (L) 06/30/2019   CO2 17 (L) 06/29/2019   CO2 25 06/18/2019   GLUCOSE 99 06/30/2019   GLUCOSE 101 (H) 06/29/2019   GLUCOSE 100 (H) 06/18/2019   BUN 110 (H) 06/30/2019   BUN 91 (H) 06/29/2019   BUN 21 06/18/2019   CREATININE 7.25 (H) 06/30/2019   CREATININE 7.39 (H) 06/29/2019   CREATININE 3.59 (H)  06/18/2019   CALCIUM 7.7 (L) 06/30/2019   CALCIUM 8.1 (L) 06/29/2019   CALCIUM 7.6 (L) 06/18/2019   LFT Recent Labs    06/29/19 1610 06/30/19 0456  PROT 4.9*  --   ALBUMIN 2.2* 2.0*  AST 15  --   ALT 11  --   ALKPHOS 43  --   BILITOT 0.7  --    PT/INR Lab Results  Component Value Date   INR 1.2 06/14/2019   INR 1.1 03/25/2019   INR 0.9 01/26/2019   Hepatitis Panel No results for input(s): HEPBSAG, HCVAB, HEPAIGM, HEPBIGM in the last 72 hours. C-Diff No components found for: CDIFF Lipase     Component Value Date/Time   LIPASE 42 06/14/2019 1010    Drugs of Abuse     Component Value Date/Time   LABOPIA NONE DETECTED 04/23/2019 1440   COCAINSCRNUR NONE DETECTED 04/23/2019 1440   LABBENZ NONE DETECTED 04/23/2019 1440  AMPHETMU NONE DETECTED 04/23/2019 1440   THCU NONE DETECTED 04/23/2019 1440   LABBARB NONE DETECTED 04/23/2019 1440     RADIOLOGY STUDIES: No results found.   IMPRESSION:   *   Melena, recurrent.  Blood loss and chronic disease anemia, recurrent, requiring multiple PRBC transfusion Received 3 PRBCs thus far, third unit completed at noon today. See above for extensive review of recent and past endoscopies. Has had duodenal ulcers on recent studies, latest EGD 06/16/2019 with recurrent deep duodenal ulcers in setting of excessive ibuprofen use. Almost certainly has ongoing duodenal ulcers with associated bleeding.  *    Rectal prolapse, rectal pain.  *   Chronic Plavix. Now discontinued, last dose was at 2100 on 6/2.     PLAN:     *   EGD???   *   Continue Protonix but switch to IV bid.     Azucena Freed  06/30/2019, 12:17 PM Phone [phone]   Attending physician's note   I have taken a history, examined the patient and reviewed the chart. I agree with the Advanced Practitioner's note, impression and recommendations.  74 yr F with multiple co-morbidities, ESRD, CAD with c/o pain in both feet. H/o chronic ischemia of both feet  with necrotic toes  GI consult for dark stool, heme positive and worsening anemia  Most recent EGD 03/26/19 with duodenal ulcer NSAID induced  Hgb 6.8, no hemodynamic instability Last plavix dose 06/29/19  Monitor hgb and tranfuse as needed  Likely etiology is bleeding from known duodenal ulcer with cont NSAID use PPI gtt Will have to wait for plavix to wash out prior to EGD, will consider urgent procedure if has evidence of active upper GI hemorrhage with hemodynamic instability .  Ok to start Heparin gtt if indicated as per vascular team  GI will cont to follow     K. Denzil Magnuson , MD [phone]

## 2019-06-30 NOTE — H&P (Signed)
History and Physical    [name] FBP:[id number] DOB: 1951/01/16 DOA: 06/29/2019  Referring MD/NP/PA: Dorothyann Peng, MD PCP: Maggie Schwalbe, PA-C  Consultants:[facility] - vascular; Otho Perl - cardiology; Kym.Balm - nephrology Patient coming from: Hemodialysis  Chief Complaint: Rectal pain  I have personally briefly reviewed patient's old medical records in [place]   HPI: Christina [name] is a 69 y.o. female with medical history significant of HTN, HLD, CAD, A. fib, COPD, ESRD on HD(TTS), SBO, rectal prolapse, and polysubstance abuse including cocaine who initially presented with complaints of rectal pain progressively worsening over the last 4 days.  Patient missed her hemodialysis yesterday due to her pain.  Notes associated complaints of shortness of breath, leg swelling, epigastric abdominal and bilateral feet pain.  She is followed by Dr. Oneida Alar of vascular surgery appears to have been contemplating amputating her toes.  Patient admits that she has had several bloody bowel movements while she has been here in the hospital.  She adamantly denies any recent cocaine use.   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  Hospitalized 4/13-4/15 with acute respiratory failure secondary to volume overload and COPD exacerbation.  4/25-4/27 at [place] for with A. fib and AVR no longer on anticoagulation due to large duodenal ulcer.  5/8-5/9 hospitalized for peripheral artery disease and gangrene of left foot for which she was discharged to follow-up with Dr. Oneida Alar.  Lastly hospitalized from 5/18-5/24 with GI bleed found to have several deep duodenal ulcers without active signs of bleeding.   ED Course: Upon admission into the emergency department patient was seen to be afebrile with pulse  52-151, blood pressures as low as 76/52 with improvement after 2 units of PRBCs during her hemodialysis.  Patient had initially been taken to hemodialysis, but returned to the emergency department after being noted to have several bowel movements with blood clots present.  Labs significant for hemoglobin 7.2-> 9.8 after 2 units of PRBCs, potassium 5.5, CO2 18, BUN 110, and creatinine 7.25.   GI have been formally consulted.   Review of Systems  Respiratory: Positive for shortness of breath. Negative for cough.   Cardiovascular: Positive for leg swelling. Negative for chest pain.  Gastrointestinal: Positive for abdominal pain and blood in stool.  Musculoskeletal: Positive for myalgias.  Neurological: Negative for loss of consciousness.  Psychiatric/Behavioral: Negative for substance abuse.  All other systems reviewed and are negative.   Past Medical History:  Diagnosis Date  . Anemia   . Anxiety   . Arthritis   . Asthma   . Atrial fibrillation ([place])   . Chronic kidney disease    Dialysis T/Th/Sa  started in March 2020  . COPD (chronic obstructive pulmonary disease) ([place])   . Coronary artery disease    2 stents  . Depression   . GERD (gastroesophageal reflux disease)   . GI bleeding 06/15/2019  . Gout   . Headache    migraines  . History of kidney stones   . Hyperlipidemia   . Hypertension   . Pneumonia   . Small bowel obstruction [facility])     Past Surgical History:  Procedure Laterality Date  . ABDOMINAL HYSTERECTOMY    . ABDOMINAL SURGERY     for small bowel obstruction - x 2  . APPENDECTOMY    . AV FISTULA PLACEMENT  Left 08/04/2018   Procedure: ARTERIOVENOUS (AV) FISTULA CREATION LEFT ARM;  Surgeon: Waynetta Sandy, MD;  Location: [place];  Service: Vascular;  Laterality: Left;  . BASCILIC VEIN TRANSPOSITION Left 11/24/2018   Procedure: SECOND STAGE BASILIC VEIN TRANSPOSITION LEFT ARM;  Surgeon: Waynetta Sandy, MD;  Location: [place];  Service:  Vascular;  Laterality: Left;  . BIOPSY  03/26/2019   Procedure: BIOPSY;  Surgeon: Lavena Bullion, DO;  Location: [place];  Service: Gastroenterology;;  . CARDIAC CATHETERIZATION    . CORONARY ANGIOPLASTY  ?2003/2004  . ESOPHAGOGASTRODUODENOSCOPY N/A 06/16/2019   Procedure: ESOPHAGOGASTRODUODENOSCOPY (EGD);  Surgeon: [name], MD;  [facility] ENDOSCOPY;  Service: Endoscopy;  Laterality: N/A;  . ESOPHAGOGASTRODUODENOSCOPY (EGD) WITH PROPOFOL N/A 03/26/2019   Procedure: ESOPHAGOGASTRODUODENOSCOPY (EGD) WITH PROPOFOL;  Surgeon: Lavena Bullion, DO;  Location: [place];  Service: Gastroenterology;  Laterality: N/A;  . FACIAL RECONSTRUCTION SURGERY     x 2  . HERNIA REPAIR    . PERIPHERAL VASCULAR INTERVENTION  04/27/2019   Procedure: PERIPHERAL VASCULAR INTERVENTION;  Surgeon: Marty Heck, MD;  Location: [place] CV LAB;  Service: Cardiovascular;;  SMA  . VISCERAL ANGIOGRAPHY N/A 04/27/2019   Procedure: MESENTERIC ANGIOGRAPHY;  Surgeon: Marty Heck, MD;  Location: [place] CV LAB;  Service: Cardiovascular;  Laterality: N/A;     reports that she quit smoking about 2 years ago. She has a 25.00 pack-year smoking history. She has never used smokeless tobacco. She reports previous alcohol use. She reports previous drug use. Drug: Marijuana.  Allergies  Allergen Reactions  . Betaine Anaphylaxis  . Cyclobenzaprine Anaphylaxis and Other (See Comments)    "stopped heart"   . Morphine Anaphylaxis    "stopped heart"  . Penicillins Shortness Of Breath, Swelling and Palpitations    Did it involve swelling of the face/tongue/throat, SOB, or low BP? Yes Did it involve sudden or severe rash/hives, skin peeling, or any reaction on the inside of your mouth or nose? No Did you need to seek medical attention at a hospital or doctor's office? No When did it last happen?years  If all above answers are "NO", may proceed with cephalosporin use.   . Ambien  [Zolpidem] Other (See Comments)    Severe confusion  . Codeine Itching and Rash  . Hydromorphone Other (See Comments)    If administered quickly, felt like hand was "exploding"     Family History  Problem Relation Age of Onset  . Ulcers Sister        sick a long time, improved after surgery    Prior to Admission medications   Medication Sig Start Date End Date Taking? Authorizing Provider  albuterol (PROVENTIL) (2.5 MG/3ML) 0.083% nebulizer solution Take 2.5 mg by nebulization every 6 (six) hours as needed for wheezing or shortness of breath.   Yes [provider]  allopurinol (ZYLOPRIM) 100 MG tablet Take 100 mg by mouth daily. 02/18/18  Yes [provider]  amiodarone (PACERONE) 200 MG tablet Take 1 tablet (200 mg total) by mouth 2 (two) times daily. Patient taking differently: Take 200 mg by mouth daily.  06/20/19  Yes Georgette Shell, MD  aspirin 81 MG EC tablet Take 81 mg by mouth daily. 05/12/19  Yes [provider]  budesonide-formoterol (SYMBICORT) 160-4.5 MCG/ACT inhaler Inhale 2 puffs into the lungs every 4 (four) hours as needed (shortness of breath/wheezing).   Yes [provider]  Cholecalciferol (VITAMIN D3) 50 MCG (2000 UT) capsule Take 2,000 Units  by mouth daily.    Yes [provider]  clopidogrel (PLAVIX) 75 MG tablet Take 75 mg by mouth daily. 06/09/19  Yes [provider]  dicyclomine (BENTYL) 10 MG capsule Take 10 mg by mouth at bedtime.  08/27/17  Yes [provider]  diphenhydrAMINE (BENADRYL) 25 MG tablet Take 25 mg by mouth at bedtime as needed for sleep.    Yes [provider]  docusate sodium (COLACE) 100 MG capsule Take 1 capsule (100 mg total) by mouth 2 (two) times daily. Patient taking differently: Take 300 mg by mouth every other day.  08/11/18  Yes Hongalgi, Lenis Dickinson, MD  ferric citrate (AURYXIA) 1 GM 210 MG(Fe) tablet Take 630 mg by mouth 3 (three) times daily with meals. 2 tablets with a  snack = 420mg    Yes [provider]  Ipratropium-Albuterol (COMBIVENT) 20-100 MCG/ACT AERS respimat Inhale 2 puffs into the lungs 4 (four) times daily as needed for wheezing. 09/11/17  Yes [provider]  isosorbide mononitrate (IMDUR) 30 MG 24 hr tablet Take 30 mg by mouth at bedtime.  08/17/18  Yes [provider]  lidocaine-prilocaine (EMLA) cream Apply 1 application topically daily as needed (pain at the site of port access).  09/03/18  Yes [provider]  metoprolol tartrate (LOPRESSOR) 50 MG tablet Take 0.5 tablets (25 mg total) by mouth 2 (two) times daily. Patient taking differently: Take 50 mg by mouth daily.  06/20/19 06/19/20 Yes Georgette Shell, MD  nitroGLYCERIN (NITROSTAT) 0.4 MG SL tablet Place 0.4 mg under the tongue every 5 (five) minutes as needed for chest pain.  09/18/16  Yes [provider]  ondansetron (ZOFRAN) 4 MG tablet Take 4 mg by mouth 3 (three) times daily as needed for nausea. 09/14/18  Yes [provider]  pantoprazole (PROTONIX) 40 MG tablet Take 1 tablet (40 mg total) by mouth 2 (two) times daily. Patient taking differently: Take 40 mg by mouth daily.  06/20/19  Yes Georgette Shell, MD  polyethylene glycol (MIRALAX / GLYCOLAX) 17 g packet Take 17 g by mouth daily as needed for mild constipation. Patient taking differently: Take 17 g by mouth every other day. At night. 03/30/19  Yes Mercy Riding, MD  pravastatin (PRAVACHOL) 80 MG tablet Take 80 mg by mouth every evening.   Yes [provider]  sennosides-docusate sodium (SENOKOT-S) 8.6-50 MG tablet Take 1 tablet by mouth as needed for constipation.    Yes [provider]  sucralfate (CARAFATE) 1 GM/10ML suspension Take 10 mLs (1 g total) by mouth every 6 (six) hours. 03/30/19 05/29/19  Mercy Riding, MD    Physical Exam:  Constitutional: Elderly female who appears chronically ill and is distress Vitals:   06/30/19 1000 06/30/19 1030 06/30/19  1100 06/30/19 1123  BP: 96/67 134/78 (!) 126/98 (!) 142/90  Pulse: 92 (!) 108 (!) 108 (!) 52  Resp: (!) 22 (!) 21 (!) 24 (!) 29  Temp: 97.8 F (36.6 C) 98.2 F (36.8 C) 98 F (36.7 C)   TempSrc: Oral Oral Oral   SpO2:  99% 98% 98%   Eyes: PERRL, lids and conjunctivae normal ENMT: Mucous membranes are dry. Posterior pharynx clear of any exudate or lesions. Neck: normal, supple, no masses, no thyromegaly Respiratory: Tachypneic with crackles heard in both lung fields. Cardiovascular: Tachycardic, no murmurs / rubs / gallops.  At least +1 pitting edema of the bilateral lower extremities.  1+ pedal pulses. No carotid bruits.  Abdomen: Midline abdominal  tenderness appreciated.  Bowel sounds present. Musculoskeletal: no clubbing / cyanosis. No joint deformity upper and lower extremities. Good ROM, no contractures. Normal muscle tone.  Skin: Necrotic appearance of the right fourth toe with some mild areas of erythema appreciated. Neurologic: CN 2-12 grossly intact. Sensation intact, DTR normal. Strength 5/5 in all 4.  Psychiatric: Normal judgment and insight. Alert and oriented x 3. Normal mood.     Labs on Admission: I have personally reviewed following labs and imaging studies  CBC: Recent Labs  Lab 06/29/19 1610 06/30/19 0457  WBC 6.3 6.1  NEUTROABS 4.7  --   HGB 6.8* 7.2*  HCT 22.5* 23.7*  MCV 94.1 93.3  PLT 261 536   Basic Metabolic Panel: Recent Labs  Lab 06/29/19 1610 06/30/19 0456  NA 138 137  K 5.0 5.5*  CL 103 108  CO2 17* 18*  GLUCOSE 101* 99  BUN 91* 110*  CREATININE 7.39* 7.25*  CALCIUM 8.1* 7.7*  PHOS  --  8.3*   GFR: Estimated Creatinine Clearance: 5.8 mL/min (A) (by C-G formula based on SCr of 7.25 mg/dL (H)). Liver Function Tests: Recent Labs  Lab 06/29/19 1610 06/30/19 0456  AST 15  --   ALT 11  --   ALKPHOS 43  --   BILITOT 0.7  --   PROT 4.9*  --   ALBUMIN 2.2* 2.0*   No results for input(s): LIPASE, AMYLASE in the last 168 hours. No  results for input(s): AMMONIA in the last 168 hours. Coagulation Profile: No results for input(s): INR, PROTIME in the last 168 hours. Cardiac Enzymes: No results for input(s): CKTOTAL, CKMB, CKMBINDEX, TROPONINI in the last 168 hours. BNP (last 3 results) No results for input(s): PROBNP in the last 8760 hours. HbA1C: No results for input(s): HGBA1C in the last 72 hours. CBG: No results for input(s): GLUCAP in the last 168 hours. Lipid Profile: No results for input(s): CHOL, HDL, LDLCALC, TRIG, CHOLHDL, LDLDIRECT in the last 72 hours. Thyroid Function Tests: No results for input(s): TSH, T4TOTAL, FREET4, T3FREE, THYROIDAB in the last 72 hours. Anemia Panel: No results for input(s): VITAMINB12, FOLATE, FERRITIN, TIBC, IRON, RETICCTPCT in the last 72 hours. Urine analysis:    Component Value Date/Time   COLORURINE YELLOW 04/23/2019 1440   APPEARANCEUR CLEAR 04/23/2019 1440   LABSPEC 1.029 04/23/2019 1440   PHURINE 8.0 04/23/2019 1440   GLUCOSEU NEGATIVE 04/23/2019 1440   HGBUR NEGATIVE 04/23/2019 1440   BILIRUBINUR NEGATIVE 04/23/2019 1440   KETONESUR NEGATIVE 04/23/2019 1440   PROTEINUR >=300 (A) 04/23/2019 1440   NITRITE NEGATIVE 04/23/2019 1440   LEUKOCYTESUR NEGATIVE 04/23/2019 1440   Sepsis Labs: Recent Results (from the past 240 hour(s))  SARS Coronavirus 2 by RT PCR (hospital order, performed in [place] hospital lab) Nasopharyngeal Nasopharyngeal Swab     Status: None   Collection Time: 06/29/19  5:01 PM   Specimen: Nasopharyngeal Swab  Result Value Ref Range Status   SARS Coronavirus 2 NEGATIVE NEGATIVE Final    Comment: (NOTE) SARS-CoV-2 target nucleic acids are NOT DETECTED. The SARS-CoV-2 RNA is generally detectable in upper and lower respiratory specimens during the acute phase of infection. The lowest concentration of SARS-CoV-2 viral copies this assay can detect is 250 copies / mL. A negative result does not preclude SARS-CoV-2 infection and should not be  used as the sole basis for treatment or other patient management decisions.  A negative result may occur with improper specimen collection / handling, submission of specimen other than nasopharyngeal  swab, presence of viral mutation(s) within the areas targeted by this assay, and inadequate number of viral copies (<250 copies / mL). A negative result must be combined with clinical observations, patient history, and epidemiological information. Fact Sheet for Patients:   StrictlyIdeas.no Fact Sheet for Healthcare Providers: BankingDealers.co.za This test is not yet approved or cleared  by the [place] FDA and has been authorized for detection and/or diagnosis of SARS-CoV-2 by FDA under an Emergency Use Authorization (EUA).  This EUA will remain in effect (meaning this test can be used) for the duration of the COVID-19 declaration under Section 564(b)(1) of the Act, 21 U.S.C. section 360bbb-3(b)(1), unless the authorization is terminated or revoked sooner. Performed at [facility], [place] [address], [place], [place] [address]      Radiological Exams on Admission: No results found.  EKG: Independently reviewed. Atrial fibrillation at 133 bpm  Assessment/Plan GI bleed, acute blood loss anemia: On admission hemoglobin noted to be 7.2.  During hemodialysis patient was noted to have large bloody bowel movements was hypotensive.  Patient was given 2 units of packed red blood cells with improvement of blood pressure.  Recently just scoped by Dr. Ardis Hughs on 5/20 where 5-6 deep ulcers were noted in the duodenum ranging from 5 mm to 10 mm with no active signs of bleeding. -Admit to a progressive bed -N.p.o. -Continue to monitor H&H and transfuse blood products as needed -Continue Protonix and Carafate -Held Plavix -Appreciate GI consultative services, we will follow-up for any further recommendations  Transient hypotension: Resolved.   Blood pressures noted initially to be as low as 76/52 with adequate response with transfusion of 2 units of PRBCs.  Home blood pressure medications normally include isosorbide mononitrate 30 mg nightly and metoprolol 25 mg twice daily. -Restart metoprolol due to persistent tachycardia   ESRD on HD: Initial labs revealed potassium 5.5, BUN 110, creatinine 7.25.  Patient was dialyzed today.  Crackles were noted on physical exam to suggest some aspect of patient being fluid overloaded along with lower extremity edema. -Check chest x-ray following recent blood transfusion -Will give Lasix IV 20 mg IV x1 dose -Appreciate nephrology consult services, we will follow for any further recommendations  Rectal prolapse: Acute on chronic -Lidocaine jelly as needed  -IV fentanyl as needed for pain   Paroxysmal atrial fibrillation: Patient's heart rates elevated into the 140s at times..  She likely is going in and out of atrial fibrillation.  Not on anticoagulation due to history of GI bleeds. -Continue amiodarone and metoprolol  Peripheral vascular disease: Patient with anterior gangrene of the right fourth toe and previous studies concerning for ischemia.  Patient followed in the outpatient setting by vascular surgery.  She had been recommended to continue on aspirin and Plavix. -Holding aspirin and Plavix due to acute bleed  COPD, without acute exacerbation. -Continue pharmacy substitution of Dulera and duo nebs as needed for shortness of breath/wheezing  Polysubstance abuse: Patient adamantly denies any recent use of cocaine.  However, question if some of patient's symptoms secondary to withdrawal.  -Check urine drug screen   Hyperlipidemia -Continue pravastatin  DNR present on admission   DVT prophylaxis: SCDs Code Status: Full Family Communication:   Disposition Plan: To be determined Consults called: GI Admission status: Inpatient  Norval Morton MD [facility] Pager  [phone]   If 7PM-7AM, please contact night-coverage [email or web address] Password [email or web address]  06/30/2019, 11:40 AM

## 2019-07-01 ENCOUNTER — Other Ambulatory Visit: Payer: Self-pay

## 2019-07-01 DIAGNOSIS — K922 Gastrointestinal hemorrhage, unspecified: Secondary | ICD-10-CM

## 2019-07-01 LAB — HEMOGLOBIN AND HEMATOCRIT, BLOOD
HCT: 23.4 % — ABNORMAL LOW (ref 36.0–46.0)
Hemoglobin: 7.8 g/dL — ABNORMAL LOW (ref 12.0–15.0)

## 2019-07-01 LAB — BASIC METABOLIC PANEL
Anion gap: 12 (ref 5–15)
BUN: 52 mg/dL — ABNORMAL HIGH (ref 8–23)
CO2: 23 mmol/L (ref 22–32)
Calcium: 7.6 mg/dL — ABNORMAL LOW (ref 8.9–10.3)
Chloride: 101 mmol/L (ref 98–111)
Creatinine, Ser: 4.18 mg/dL — ABNORMAL HIGH (ref 0.44–1.00)
GFR calc Af Amer: 12 mL/min — ABNORMAL LOW (ref >60)
GFR calc non Af Amer: 10 mL/min — ABNORMAL LOW (ref >60)
Glucose, Bld: 85 mg/dL (ref 70–99)
Potassium: 4.3 mmol/L (ref 3.5–5.1)
Sodium: 136 mmol/L (ref 135–145)

## 2019-07-01 LAB — PREPARE RBC (CROSSMATCH)

## 2019-07-01 LAB — CBC
HCT: 23.8 % — ABNORMAL LOW (ref 36.0–46.0)
Hemoglobin: 7.9 g/dL — ABNORMAL LOW (ref 12.0–15.0)
MCH: 29.8 pg (ref 26.0–34.0)
MCHC: 33.2 g/dL (ref 30.0–36.0)
MCV: 89.8 fL (ref 80.0–100.0)
Platelets: 177 10*3/uL (ref 150–400)
RBC: 2.65 MIL/uL — ABNORMAL LOW (ref 3.87–5.11)
RDW: 15.7 % — ABNORMAL HIGH (ref 11.5–15.5)
WBC: 6.3 10*3/uL (ref 4.0–10.5)
nRBC: 0 % (ref 0.0–0.2)

## 2019-07-01 MED ORDER — DOCUSATE SODIUM 100 MG PO CAPS
ORAL_CAPSULE | ORAL | Status: AC
  Administered 2019-07-01: 100 mg via ORAL
  Filled 2019-07-01: qty 1

## 2019-07-01 MED ORDER — NEPRO/CARBSTEADY PO LIQD
237.0000 mL | Freq: Two times a day (BID) | ORAL | Status: DC
  Administered 2019-07-01 – 2019-07-07 (×7): 237 mL via ORAL

## 2019-07-01 MED ORDER — SODIUM CHLORIDE 0.9 % IV SOLN
8.0000 mg/h | INTRAVENOUS | Status: AC
  Administered 2019-07-01 – 2019-07-04 (×5): 8 mg/h via INTRAVENOUS
  Filled 2019-07-01 (×7): qty 80

## 2019-07-01 MED ORDER — DARBEPOETIN ALFA 200 MCG/0.4ML IJ SOSY
200.0000 ug | PREFILLED_SYRINGE | INTRAMUSCULAR | Status: DC
  Administered 2019-07-02: 200 ug via INTRAVENOUS

## 2019-07-01 MED ORDER — SODIUM CHLORIDE 0.9 % IV SOLN
80.0000 mg | Freq: Once | INTRAVENOUS | Status: AC
  Administered 2019-07-01: 80 mg via INTRAVENOUS
  Filled 2019-07-01: qty 80

## 2019-07-01 MED ORDER — PANTOPRAZOLE SODIUM 40 MG IV SOLR
40.0000 mg | Freq: Two times a day (BID) | INTRAVENOUS | Status: DC
  Administered 2019-07-05 (×2): 40 mg via INTRAVENOUS
  Filled 2019-07-01 (×3): qty 40

## 2019-07-01 MED ORDER — OXYCODONE-ACETAMINOPHEN 5-325 MG PO TABS
1.0000 | ORAL_TABLET | Freq: Four times a day (QID) | ORAL | Status: DC | PRN
  Administered 2019-07-01 – 2019-07-14 (×20): 1 via ORAL
  Filled 2019-07-01 (×21): qty 1

## 2019-07-01 MED ORDER — SODIUM CHLORIDE 0.9% IV SOLUTION: Freq: Once | INTRAVENOUS | Status: AC

## 2019-07-01 NOTE — Progress Notes (Addendum)
Subjective:  Reports tolerated hd yest  And abd pain resolved  But blood in am bm,   Objective Vital signs in last 24 hours: Vitals:   07/01/19 0401 07/01/19 0420 07/01/19 0509 07/01/19 0914  BP:    (!) 142/62  Pulse:  70  76  Resp:  12  20  Temp: 98.1 F (36.7 C)     TempSrc: Oral     SpO2:  90%  97%  Weight:   52 kg   Height:       Weight change:    Physical Exam General:alerrt thin chronically  ill appearing female Heart: RRR no r, m, g Lungs: dim BS bases  otherwise CTA , non labored  breathing  Abdomen: BS +,soft , some upper tenderness - not distended  Extremities: trace bipedal edema, left>Right  toes with  Ischemic changes , no dc  Dialysis Access: LUA AVF pos bruit ,  right IJ TDC   Dialysis Orders: [facility] TTS 4 hr EDW 48 kg is ?? F180 BFR 350 mL/ min DFR A1.5 UF profile 2 no heparin Venofer 100 mg q rx x 9, started 5/27 Mircera 200 q 2 weeks, last given 06/02/19 Calcitriol 1.0 cmg q rx  Problem/Plan: 1. Recurrent GIB - melanoic stools on HD with abdominal pain - avoid low BP transfuse 2 units - have contacted - ED to arrange for admission  2. ESRD -  last HD 5/27 , nl TTS ,admit K 5.5 BUN 110 = BUN 52  , K4.3 this after hd yest  - used  TDC on admit   - not clear why we are not using AVF - it looks good - will use next treatment tomorrow- continue to dialyze as long as stable- 3. Anemia - hgb 6.8 > 7.2>7.9 this am  - behind on ESA dosing heme neg- redose ESA yest  Not given , give Aranesp sat 6/5 hd  continue course of Fe - recent acute GIB in May with multiple deep duodenal ulcers - PPI indefinitely 4. Secondary hyperparathyroidism - resume calcitriol phos 8.3  Binders with food  5. HTN/volume -  bp 142/62 this am  Had excess volume on admit  titrating down -  yest goal 3.5  ( don't see uf recorded  and bp was stable)  given hx mesenteric ischemia - keep SBP > 110. EDW was raised from 40.5 > 48 after last admission - post wt prior to last admission was 44 at the  most -  need to tritrate EDW back down some what. 6. Nutrition - alb 2.0  will need renal diet/vits and nepro supplement  7. Mesenteric ischemia s/p SMA stent 05/2019 8. PAD - ischemic left 4th and 5th toes Dr Early seeing 9.  Atrial fibrillation - no anticoag due to GIB - but on plavix - on amiodarone, SR  On exam   10. Hx cocaine abuse -  11. 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 DNR    Ernest Haber, PA-C [place] [phone] 07/01/2019,12:58 PM  LOS: 1 day   Pt seen, examined and agree w A/P as above.  Kelly Splinter  MD 07/01/2019, 3:50 PM    Labs: Basic Metabolic Panel: Recent Labs  Lab 06/29/19 1610 06/30/19 0456 07/01/19 0617  NA 138 137 136  K 5.0 5.5* 4.3  CL 103 108 101  CO2 17* 18* 23  GLUCOSE 101* 99 85  BUN 91* 110* 52*  CREATININE 7.39* 7.25* 4.18*  CALCIUM 8.1* 7.7* 7.6*  PHOS  --  8.3*  --    Liver Function Tests: Recent Labs  Lab 06/29/19 1610 06/30/19 0456  AST 15  --   ALT 11  --   ALKPHOS 43  --   BILITOT 0.7  --   PROT 4.9*  --   ALBUMIN 2.2* 2.0*   No results for input(s): LIPASE, AMYLASE in the last 168 hours. No results for input(s): AMMONIA in the last 168 hours. CBC: Recent Labs  Lab 06/29/19 1610 06/29/19 1610 06/30/19 0457 06/30/19 0457 06/30/19 1311 06/30/19 1852 07/01/19 0617  WBC 6.3   < > 6.1  --  9.8  --  6.3  NEUTROABS 4.7  --   --   --   --   --   --   HGB 6.8*   < > 7.2*   < > 9.8* 8.8* 7.9*  HCT 22.5*   < > 23.7*   < > 29.2* 26.2* 23.8*  MCV 94.1  --  93.3  --  88.8  --  89.8  PLT 261   < > 257  --  166  --  177   < > = values in this interval not displayed.   Cardiac Enzymes: No results for input(s): CKTOTAL, CKMB, CKMBINDEX, TROPONINI in the last 168 hours. CBG: No results for input(s): GLUCAP in the last 168 hours.  Studies/Results: DG CHEST PORT 1 VIEW  Result Date: 06/30/2019 CLINICAL DATA:  GI bleed EXAM: PORTABLE CHEST 1 VIEW COMPARISON:  06/19/2019 FINDINGS:  Right-sided hemodialysis catheter terminates at the level of the right atrium. Stable mild cardiomegaly. Atherosclerotic calcification of the aortic knob. Mild diffuse interstitial markings suggesting mild edema. No focal airspace consolidation. No pleural effusion or pneumothorax. IMPRESSION: Mildly prominent diffuse interstitial markings suggesting mild edema. Electronically Signed   By: Davina Poke D.O.   On: 06/30/2019 16:14   Medications:  sodium chloride     sodium chloride     ferric gluconate (FERRLECIT/NULECIT) IV 125 mg (07/01/19 1231)   pantoprozole (PROTONIX) infusion      sodium chloride   Intravenous Once   sodium chloride   Intravenous Once   allopurinol  100 mg Oral Daily   amiodarone  200 mg Oral BID   calcitRIOL  1 mcg Oral Q M,W,F-HD   Chlorhexidine Gluconate Cloth  6 each Topical Q0600   cholecalciferol  2,000 Units Oral Daily   darbepoetin (ARANESP) injection - DIALYSIS  200 mcg Intravenous Q Wed-HD   dicyclomine  10 mg Oral QHS   docusate sodium  100 mg Oral BID   ferric citrate  630 mg Oral TID WC   isosorbide mononitrate  30 mg Oral QHS   lidocaine  1 application Topical Once   metoprolol tartrate  25 mg Oral BID   mometasone-formoterol  2 puff Inhalation BID   [START ON 07/04/2019] pantoprazole  40 mg Intravenous Q12H   pravastatin  80 mg Oral QPM   sodium chloride flush  3 mL Intravenous Q12H   sucralfate  1 g Oral Q6H

## 2019-07-01 NOTE — Progress Notes (Signed)
PROGRESS NOTE                                                                                                                                                                                                             Patient Demographics:    Christina [name], is a 69 y.o. female, DOB - March 27, 1950, DEY:[id number]  Admit date - 06/29/2019   Admitting Physician Norval Morton, MD  Outpatient Primary MD for the patient is Nodal, [name], PA-C  LOS - 1   Chief Complaint  Patient presents with  . Rectal Problems       Brief Narrative    Christina [name] is a 69 y.o. female with medical history significant of HTN, HLD, CAD, A. fib, COPD, ESRD on HD(TTS), SBO, rectal prolapse, and polysubstance abuse including cocaine who initially presented with complaints of rectal pain progressively worsening over the last 4 days.  Patient missed her hemodialysis yesterday due to her pain.  Notes associated complaints of shortness of breath, leg swelling, epigastric abdominal and bilateral feet pain.  She is followed by Dr. Oneida Alar of vascular surgery appears to have been contemplating amputating her toes.  Patient admits that she has had several bloody bowel movements while she has been here in the hospital.  She adamantly denies any recent cocaine use.   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  Hospitalized 4/13-4/15 with acute respiratory failure secondary to volume overload and COPD exacerbation.  4/25-4/27 at [facility] for with A. fib and AVR no longer on anticoagulation due to large duodenal ulcer.  5/8-5/9 hospitalized for peripheral artery disease and gangrene of left foot for which she was discharged to follow-up with Dr. Oneida Alar.  Lastly hospitalized from 5/18-5/24  with GI bleed found to have several deep duodenal ulcers without active signs of bleeding.   ED Course: Upon admission into the emergency department patient was seen to be afebrile with pulse 52-151, blood pressures as low as 76/52 with improvement after 2 units of PRBCs during her hemodialysis.  Patient had initially been taken to hemodialysis, but returned to the emergency department after being noted to have several bowel movements with blood clots present.  Labs significant for hemoglobin 7.2-> 9.8 after 2 units of PRBCs, potassium 5.5, CO2 18, BUN 110, and creatinine 7.25.  [facility] have been formally consulted.     Subjective:    Christina [name] today reports multiple bowel movement with melena overnight, complaining of pain in her toes, no nausea, no vomiting.   Assessment  & Plan :    Active Problems:   GI bleed   GI bleed, acute blood loss anemia:  -Is most likely related to upper GI bleed, with known history of duodenal ulcer . -She was transfused 3 units PRBC, hemoglobin has responded appropriately, but it is trending down, she still complaining of melena . -Continue to hold aspirin and Plavix. -Plan for endoscopy, timing per GI. -New with PPI, given she still having melena, and her hemoglobin is dropping, will continue with Protonix drip. -Diet has been advanced by GI. -Continue with close H&H monitoring, likely will need further transfusions, given her hemoglobin has been steadily trending after her transfusion, will check H&H now and transfuse if needed.  Transient hypotension:  -Resolved, secondary to acute blood loss anemia .  ESRD on HD:  - renal consulted   Rectal prolapse: Acute on chronic -Lidocaine jelly as needed   Paroxysmal atrial fibrillation:  -  Not on anticoagulation due to history of GI bleeds. -Continue amiodarone and metoprolol  Peripheral vascular disease with ischemic toes without gangrene -Neurosurgery input greatly appreciated,  plan radiogram, timing by vascular surgery -Holding aspirin and Plavix due to acute bleed  COPD, without acute exacerbation. -Continue pharmacy substitution of Dulera and duo nebs as needed for shortness of breath/wheezing  Polysubstance abuse:  -Check urine drug screen   Hyperlipidemia -Continue pravastatin   COVID-19 Labs  No results for input(s): DDIMER, FERRITIN, LDH, CRP in the last 72 hours.  Lab Results  Component Value Date   SARSCOV2NAA NEGATIVE 06/29/2019   [place] NEGATIVE 06/14/2019   [place] NEGATIVE 05/10/2019   [place] NEGATIVE 04/23/2019     Code Status : DNR  Family Communication  : None at bedside  Disposition Plan  :  Status is: Inpatient  Remains inpatient appropriate because:Hemodynamically unstable   Dispo: The patient is from: Home              Anticipated d/c is to: SNF              Anticipated d/c date is: > 3 days              Patient currently is not medically stable to d/c.    Consults  :  Renal,GI,Vascular surgery  Procedures  : none  DVT Prophylaxis  :  SCD  Lab Results  Component Value Date   PLT 177 07/01/2019    Antibiotics  :    Anti-infectives (From admission, onward)   None        Objective:   Vitals:   07/01/19 0401 07/01/19 0420 07/01/19 0509 07/01/19 0914  BP:    (!) 142/62  Pulse:  70  76  Resp:  12  20  Temp: 98.1 F (36.7 C)     TempSrc: Oral     SpO2:  90%  97%  Weight:   52 kg   Height:        Wt Readings from Last 3 Encounters:  07/01/19 52 kg  06/20/19 50.8 kg  05/12/19 41.9 kg     Intake/Output Summary (Last 24 hours) at 07/01/2019 1452 Last data filed at 07/01/2019 0500 Gross per 24  hour  Intake 726 ml  Output --  Net 726 ml     Physical Exam  Awake Alert, Oriented X 3, No new F.N deficits, Normal affect Symmetrical Chest wall movement, Good air movement bilaterally, CTAB RRR,No Gallops,Rubs or new Murmurs, No Parasternal Heave +ve B.Sounds, Abd Soft, No  tenderness,  No rebound - guarding or rigidity. Patient with ischemic changes of her toes bilaterally, without cellulitis or drainage, please see pictures below.           Data Review:    CBC Recent Labs  Lab 06/29/19 1610 06/30/19 0457 06/30/19 1311 06/30/19 1852 07/01/19 0617  WBC 6.3 6.1 9.8  --  6.3  HGB 6.8* 7.2* 9.8* 8.8* 7.9*  HCT 22.5* 23.7* 29.2* 26.2* 23.8*  PLT 261 257 166  --  177  MCV 94.1 93.3 88.8  --  89.8  MCH 28.5 28.3 29.8  --  29.8  MCHC 30.2 30.4 33.6  --  33.2  RDW 17.6* 16.6* 14.9  --  15.7*  LYMPHSABS 0.9  --   --   --   --   MONOABS 0.5  --   --   --   --   EOSABS 0.1  --   --   --   --   BASOSABS 0.1  --   --   --   --     Chemistries  Recent Labs  Lab 06/29/19 1610 06/30/19 0456 07/01/19 0617  NA 138 137 136  K 5.0 5.5* 4.3  CL 103 108 101  CO2 17* 18* 23  GLUCOSE 101* 99 85  BUN 91* 110* 52*  CREATININE 7.39* 7.25* 4.18*  CALCIUM 8.1* 7.7* 7.6*  AST 15  --   --   ALT 11  --   --   ALKPHOS 43  --   --   BILITOT 0.7  --   --    ------------------------------------------------------------------------------------------------------------------ No results for input(s): CHOL, HDL, LDLCALC, TRIG, CHOLHDL, LDLDIRECT in the last 72 hours.  Lab Results  Component Value Date   HGBA1C 4.9 06/19/2019   ------------------------------------------------------------------------------------------------------------------ No results for input(s): TSH, T4TOTAL, T3FREE, THYROIDAB in the last 72 hours.  Invalid input(s): FREET3 ------------------------------------------------------------------------------------------------------------------ No results for input(s): VITAMINB12, FOLATE, FERRITIN, TIBC, IRON, RETICCTPCT in the last 72 hours.  Coagulation profile No results for input(s): INR, PROTIME in the last 168 hours.  No results for input(s): DDIMER in the last 72 hours.  Cardiac Enzymes No results for input(s): CKMB, TROPONINI,  MYOGLOBIN in the last 168 hours.  Invalid input(s): CK ------------------------------------------------------------------------------------------------------------------    Component Value Date/Time   BNP 2,007.2 (H) 03/25/2019 1313    Inpatient Medications  Scheduled Meds: . sodium chloride   Intravenous Once  . sodium chloride   Intravenous Once  . allopurinol  100 mg Oral Daily  . amiodarone  200 mg Oral BID  . calcitRIOL  1 mcg Oral Q M,W,F-HD  . Chlorhexidine Gluconate Cloth  6 each Topical Q0600  . cholecalciferol  2,000 Units Oral Daily  . [START ON 07/02/2019] darbepoetin (ARANESP) injection - DIALYSIS  200 mcg Intravenous Q Sat-HD  . dicyclomine  10 mg Oral QHS  . docusate sodium  100 mg Oral BID  . feeding supplement (NEPRO CARB STEADY)  237 mL Oral BID BM  . ferric citrate  630 mg Oral TID WC  . isosorbide mononitrate  30 mg Oral QHS  . lidocaine  1 application Topical Once  . metoprolol tartrate  25 mg Oral BID  .  mometasone-formoterol  2 puff Inhalation BID  . [START ON 07/04/2019] pantoprazole  40 mg Intravenous Q12H  . pravastatin  80 mg Oral QPM  . sodium chloride flush  3 mL Intravenous Q12H  . sucralfate  1 g Oral Q6H   Continuous Infusions: . sodium chloride    . sodium chloride    . ferric gluconate (FERRLECIT/NULECIT) IV 125 mg (07/01/19 1231)  . pantoprozole (PROTONIX) infusion 8 mg/hr (07/01/19 1258)   PRN Meds:.sodium chloride, sodium chloride, acetaminophen **OR** acetaminophen, alteplase, clobetasol cream, diphenhydrAMINE, fentaNYL (SUBLIMAZE) injection, heparin sodium (porcine), ipratropium-albuterol, lidocaine (PF), lidocaine-prilocaine, lidocaine-prilocaine, nitroGLYCERIN, ondansetron, pentafluoroprop-tetrafluoroeth, polyethylene glycol, senna-docusate  Micro Results Recent Results (from the past 240 hour(s))  SARS Coronavirus 2 by RT PCR (hospital order, performed in [place] hospital lab) Nasopharyngeal Nasopharyngeal Swab     Status: None    Collection Time: 06/29/19  5:01 PM   Specimen: Nasopharyngeal Swab  Result Value Ref Range Status   SARS Coronavirus 2 NEGATIVE NEGATIVE Final    Comment: (NOTE) SARS-CoV-2 target nucleic acids are NOT DETECTED. The SARS-CoV-2 RNA is generally detectable in upper and lower respiratory specimens during the acute phase of infection. The lowest concentration of SARS-CoV-2 viral copies this assay can detect is 250 copies / mL. A negative result does not preclude SARS-CoV-2 infection and should not be used as the sole basis for treatment or other patient management decisions.  A negative result may occur with improper specimen collection / handling, submission of specimen other than nasopharyngeal swab, presence of viral mutation(s) within the areas targeted by this assay, and inadequate number of viral copies (<250 copies / mL). A negative result must be combined with clinical observations, patient history, and epidemiological information. Fact Sheet for Patients:   StrictlyIdeas.no Fact Sheet for Healthcare Providers: BankingDealers.co.za This test is not yet approved or cleared  by the [place] FDA and has been authorized for detection and/or diagnosis of SARS-CoV-2 by FDA under an Emergency Use Authorization (EUA).  This EUA will remain in effect (meaning this test can be used) for the duration of the COVID-19 declaration under Section 564(b)(1) of the Act, 21 U.S.C. section 360bbb-3(b)(1), unless the authorization is terminated or revoked sooner. Performed at [facility], [place] [address], [place], [place] [address]   MRSA PCR Screening     Status: None   Collection Time: 06/30/19  5:37 PM   Specimen: Nasopharyngeal  Result Value Ref Range Status   MRSA by PCR NEGATIVE NEGATIVE Final    Comment:        The GeneXpert MRSA Assay (FDA approved for NASAL specimens only), is one component of a comprehensive MRSA  colonization surveillance program. It is not intended to diagnose MRSA infection nor to guide or monitor treatment for MRSA infections. Performed at [facility], [place] [address] [place], [place] [address]     Radiology Reports CT ANGIO HEAD W OR WO CONTRAST  Result Date: 06/19/2019 CLINICAL DATA:  69 year old female dialysis patient with encephalopathy, confusion after dialysis. Recent MRI demonstrating a small chronic left occipital cortical infarct, nearby chronic left occipital pole encephalomalacia, underlying advanced chronic ischemic disease. EXAM: CT ANGIOGRAPHY HEAD AND NECK TECHNIQUE: Multidetector CT imaging of the head and neck was performed using the standard protocol during bolus administration of intravenous contrast. Multiplanar CT image reconstructions and MIPs were obtained to evaluate the vascular anatomy. Carotid stenosis measurements (when applicable) are obtained utilizing NASCET criteria, using the distal internal carotid diameter as the denominator. CONTRAST:  12mL OMNIPAQUE IOHEXOL 350 MG/ML SOLN COMPARISON:  Brain MRI and head CT 06/17/2019. Neck CT without contrast 07/11/2016 FINDINGS: CT HEAD Brain: Subtle cortical hypodensity now on the left lateral occiput corresponding to the DWI lesion (series 5, image 14). No acute intracranial hemorrhage or mass effect. Otherwise Stable non contrast CT appearance of the brain. Calvarium and skull base: No acute osseous abnormality identified. Paranasal sinuses: Visualized paranasal sinuses and mastoids are stable and well pneumatized. Orbits: Stable orbit and scalp soft tissues. CTA NECK Skeleton: Absent maxillary dentition. Advanced cervical spine degeneration with reversal of lordosis. Chronic deformity posterior right 1st and 2nd ribs. No acute osseous abnormality identified. Upper chest: Small layering pleural effusions are partially visible. Mild mosaic pulmonary attenuation. Visible precarinal and AP window lymph nodes are  at the upper limits of normal. Right IJ approach dual lumen dialysis catheter. Other neck: No acute findings.  No cervical lymphadenopathy. Aortic arch: Severe atherosclerosis of the visible aorta including bulky mural plaque or thrombus throughout the arch (series 12, images 80 through 134). Three vessel arch configuration. Right carotid system: No brachiocephalic artery or right CCA origin stenosis despite plaque. Tortuous proximal right CCA. Intermittent plaque to the bifurcation without stenosis. Less than 50 % stenosis with respect to the distal vessel of the proximal right ICA despite plaque. Left carotid system: Prominent plaque at the left CCA origin and along the ventral vessel at the thoracic inlet but less than 50% stenosis (series 9, image 132). At the level of the thyroid combined soft and calcified plaque results in 60% stenosis (series 9, image 122). Capacious left carotid bifurcation with surgical clips compatible with previous endarterectomy. Tortuous left ICA distal to the bulb without stenosis to the skull base. Vertebral arteries: Proximal right subclavian soft and calcified plaque similar to the other great vessels. Moderate stenosis right vertebral artery origin best seen on series 12. Tortuous right V1 segment. No additional right vertebral stenosis to the skull base. Widespread proximal left subclavian artery plaque similar to the other great vessels without significant stenosis. Calcified plaque at the left vertebral artery origin, moderate stenosis best seen on series 11, image 262. Tortuous left V1 segment. Codominant left vertebral artery otherwise patent to the skull base. CTA HEAD Posterior circulation: Bilateral V4 segment soft and calcified plaque. No significant stenosis on the left but moderate to severe right V4 stenosis just distal to PICA on series 12, image 113. Normal left PICA. Patent vertebrobasilar junction. Patent basilar artery with only mild irregularity. Normal SCA and  PCA origins. Posterior communicating arteries are diminutive or absent. The left PCA P3 superior division is occluded, although there is chronic encephalomalacia in the left PCA territory also. On the right there is mild to moderate P1 and P2 irregularity and stenosis. Anterior circulation: Both ICA siphons are patent. Heavy calcification through the left cavernous segment and anterior genu with mild to moderate stenosis. Similar right siphon calcified plaque with at least moderate stenosis at the anterior genu. Patent carotid termini. Normal MCA and ACA origins. Anterior communicating artery and bilateral ACA branches are within normal limits. Left MCA M1 segment and bifurcation are patent without stenosis. Right MCA M1 segment and bifurcation are patent without stenosis. Bilateral MCA branches are within normal limits. Venous sinuses: Early contrast timing but grossly patent. Anatomic variants: None. Review of the MIP images confirms the above findings IMPRESSION: 1. Negative for large vessel occlusion. 2. Positive for bulky Mural Soft Plaque versus Thrombus throughout the Aortic Arch. See series 12 images 80-134.  Aortic Atherosclerosis (ICD10-I70.0). 3. Positive for occluded Left PCA superior P3 branch, although age indeterminate in light of some chronic left PCA territory encephalomalacia. 4. Positive also for significant atherosclerotic stenoses: - Left CCA 60% stenosis at the level of the thyroid. - moderate bilateral vertebral artery origin stenosis, and up to Severe Right Vertebral V4 segment stenosis distal to PICA. - bilateral ICA siphon stenosis, up to moderate on the Left and Moderate To Severe at the Right ICA anterior genu. - moderate Right PCA P2 stenosis. 5.  Stable non contrast CT appearance of the brain. 6. Small bilateral pleural effusions. Advanced cervical spine degeneration. Electronically Signed   By: Genevie Ann M.D.   On: 06/19/2019 13:25   DG Chest 1 View  Result Date: 06/17/2019 CLINICAL  DATA:  Hematemesis, history small bowel obstruction, hypertension, coronary artery disease post stenting, COPD, end-stage renal disease on hemodialysis, anemia secondary to GI bleed EXAM: CHEST  1 VIEW COMPARISON:  Portable exam 1506 hours compared to 06/14/2019 FINDINGS: RIGHT jugulars dialysis catheter with tip projecting over RIGHT atrium. Enlargement of cardiac silhouette with slight pulmonary vascular congestion. Atherosclerotic calcification aorta. Minimal perihilar to basilar pulmonary infiltrates consistent with pulmonary edema. No pleural effusion or pneumothorax. Bones demineralized. IMPRESSION: Enlargement of cardiac silhouette with pulmonary vascular congestion and minimal pulmonary edema. Electronically Signed   By: Lavonia Dana M.D.   On: 06/17/2019 15:26   CT HEAD WO CONTRAST  Result Date: 06/17/2019 CLINICAL DATA:  Delirium/confusion after dialysis EXAM: CT HEAD WITHOUT CONTRAST TECHNIQUE: Contiguous axial images were obtained from the base of the skull through the vertex without intravenous contrast. COMPARISON:  July 17, 2018 maxillofacial CT covering much of the brain parenchyma FINDINGS: Brain: There is mild diffuse atrophy. There is no intracranial mass, hemorrhage, extra-axial fluid collection, or midline shift. There is widespread small vessel disease throughout the centra semiovale bilaterally. There is a prior appearing infarct in the superior mid left frontal lobe with involvement of both gray and white matter in this area. There is a prior appearing infarct in the posterosuperior left parietal lobe at the gray-white junction. There is evidence of a prior infarct in the medial right parietooccipital junction region. There is a prior infarct in the head of the caudate nucleus on the right. There is an age uncertain infarct in the left thalamus which has somewhat indistinct borders suggesting that this infarct may be recent/acute. There are small lacunar type infarcts in each cerebellar  hemisphere, more on the right than on the left. Vascular: No hyperdense vessel. There are foci of calcification in each carotid siphon and distal vertebral artery region. Skull: The bony calvarium a appears intact. Sinuses/Orbits: Paranasal sinuses are clear. There is rightward deviation of the nasal septum. Orbits appear symmetric bilaterally. Other: Mastoid air cells are clear. There is debris in each external auditory canal. Brain: It IMPRESSION: Mild atrophy. Extensive supratentorial small vessel disease. 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 And head of caudate nucleus on the right. Suspect more recent and potentially acute focal infarct in the left thalamus. Lacunar type infarcts noted in the cerebellar hemispheres bilaterally, more on the right than on the left. No mass or hemorrhage. Foci of arterial vascular calcification noted at multiple levels. Probable cerumen in each external auditory canal. Deviated nasal septum. Electronically Signed   By: Lowella Grip III M.D.   On: 06/17/2019 16:19   CT ANGIO NECK W OR WO CONTRAST  Result Date: 06/19/2019 CLINICAL DATA:  68 year old female dialysis patient with encephalopathy, confusion after dialysis. Recent MRI demonstrating a small chronic left occipital cortical infarct, nearby chronic left occipital pole encephalomalacia, underlying advanced chronic ischemic disease. EXAM: CT ANGIOGRAPHY HEAD AND NECK TECHNIQUE: Multidetector CT imaging of the head and neck was performed using the standard protocol during bolus administration of intravenous contrast. Multiplanar CT image reconstructions and MIPs were obtained to evaluate the vascular anatomy. Carotid stenosis measurements (when applicable) are obtained utilizing NASCET criteria, using the distal internal carotid diameter as the denominator. CONTRAST:  87mL OMNIPAQUE IOHEXOL 350 MG/ML SOLN  COMPARISON:  Brain MRI and head CT 06/17/2019. Neck CT without contrast 07/11/2016 FINDINGS: CT HEAD Brain: Subtle cortical hypodensity now on the left lateral occiput corresponding to the DWI lesion (series 5, image 14). No acute intracranial hemorrhage or mass effect. Otherwise Stable non contrast CT appearance of the brain. Calvarium and skull base: No acute osseous abnormality identified. Paranasal sinuses: Visualized paranasal sinuses and mastoids are stable and well pneumatized. Orbits: Stable orbit and scalp soft tissues. CTA NECK Skeleton: Absent maxillary dentition. Advanced cervical spine degeneration with reversal of lordosis. Chronic deformity posterior right 1st and 2nd ribs. No acute osseous abnormality identified. Upper chest: Small layering pleural effusions are partially visible. Mild mosaic pulmonary attenuation. Visible precarinal and AP window lymph nodes are at the upper limits of normal. Right IJ approach dual lumen dialysis catheter. Other neck: No acute findings.  No cervical lymphadenopathy. Aortic arch: Severe atherosclerosis of the visible aorta including bulky mural plaque or thrombus throughout the arch (series 12, images 80 through 134). Three vessel arch configuration. Right carotid system: No brachiocephalic artery or right CCA origin stenosis despite plaque. Tortuous proximal right CCA. Intermittent plaque to the bifurcation without stenosis. Less than 50 % stenosis with respect to the distal vessel of the proximal right ICA despite plaque. Left carotid system: Prominent plaque at the left CCA origin and along the ventral vessel at the thoracic inlet but less than 50% stenosis (series 9, image 132). At the level of the thyroid combined soft and calcified plaque results in 60% stenosis (series 9, image 122). Capacious left carotid bifurcation with surgical clips compatible with previous endarterectomy. Tortuous left ICA distal to the bulb without stenosis to the skull base. Vertebral  arteries: Proximal right subclavian soft and calcified plaque similar to the other great vessels. Moderate stenosis right vertebral artery origin best seen on series 12. Tortuous right V1 segment. No additional right vertebral stenosis to the skull base. Widespread proximal left subclavian artery plaque similar to the other great vessels without significant stenosis. Calcified plaque at the left vertebral artery origin, moderate stenosis best seen on series 11, image 262. Tortuous left V1 segment. Codominant left vertebral artery otherwise patent to the skull base. CTA HEAD Posterior circulation: Bilateral V4 segment soft and calcified plaque. No significant stenosis on the left but moderate to severe right V4 stenosis just distal to PICA on series 12, image 113. Normal left PICA. Patent vertebrobasilar junction. Patent basilar artery with only mild irregularity. Normal SCA and PCA origins. Posterior communicating arteries are diminutive or absent. The left PCA P3 superior division is occluded, although there is chronic encephalomalacia in the left PCA territory also. On the right there is mild to moderate P1 and P2 irregularity and stenosis. Anterior circulation: Both ICA siphons are patent. Heavy calcification through the left cavernous segment and anterior genu with mild to moderate stenosis. Similar right siphon calcified plaque with at least  moderate stenosis at the anterior genu. Patent carotid termini. Normal MCA and ACA origins. Anterior communicating artery and bilateral ACA branches are within normal limits. Left MCA M1 segment and bifurcation are patent without stenosis. Right MCA M1 segment and bifurcation are patent without stenosis. Bilateral MCA branches are within normal limits. Venous sinuses: Early contrast timing but grossly patent. Anatomic variants: None. Review of the MIP images confirms the above findings IMPRESSION: 1. Negative for large vessel occlusion. 2. Positive for bulky Mural Soft  Plaque versus Thrombus throughout the Aortic Arch. See series 12 images 80-134. Aortic Atherosclerosis (ICD10-I70.0). 3. Positive for occluded Left PCA superior P3 branch, although age indeterminate in light of some chronic left PCA territory encephalomalacia. 4. Positive also for significant atherosclerotic stenoses: - Left CCA 60% stenosis at the level of the thyroid. - moderate bilateral vertebral artery origin stenosis, and up to Severe Right Vertebral V4 segment stenosis distal to PICA. - bilateral ICA siphon stenosis, up to moderate on the Left and Moderate To Severe at the Right ICA anterior genu. - moderate Right PCA P2 stenosis. 5.  Stable non contrast CT appearance of the brain. 6. Small bilateral pleural effusions. Advanced cervical spine degeneration. Electronically Signed   By: Genevie Ann M.D.   On: 06/19/2019 13:25   MR BRAIN WO CONTRAST  Result Date: 06/17/2019 CLINICAL DATA:  69 year old female with encephalopathy. Confusion after dialysis. EXAM: MRI HEAD WITHOUT CONTRAST TECHNIQUE: Multiplanar, multiecho pulse sequences of the brain and surrounding structures were obtained without intravenous contrast. COMPARISON:  Head CT without contrast earlier today. FINDINGS: The examination had to be discontinued prior to completion by patient request. Diffusion-weighted imaging, axial T2 imaging, and moderately degraded sagittal T1 imaging only was obtained. Major intracranial vascular flow voids are preserved. There is a tiny focus of restricted diffusion in the cortex of the left lateral occipital lobe. No other convincing restricted diffusion. See series 5, image 69. Nearby chronic cortical encephalomalacia in the left occipital pole. There are scattered small chronic infarcts in both cerebellar hemispheres, in the bilateral basal ganglia. There are chronic cortical infarcts in the bilateral MCA territories, and right posterior MCA/PCA watershed with cortical encephalomalacia. Superimposed confluent  bilateral cerebral white matter T2 hyperintensity. No midline shift, mass effect, evidence of mass lesion, ventriculomegaly, extra-axial collection or acute intracranial hemorrhage is evident. Paranasal Visualized paranasal sinuses and mastoids are stable and well pneumatized. Visible internal auditory structures appear normal. Mildly asymmetric size of the right superior ophthalmic vein, but otherwise negative orbits. Unremarkable noncontrast visible cavernous sinus. IMPRESSION: 1. Truncated exam by patient request. 2. Positive for a tiny acute cortical infarct in the left occipital lobe, located near a chronic left PCA infarct. 3. Underlying advanced chronic ischemic disease in the bilateral cerebellum and bilateral MCA territories. Electronically Signed   By: Genevie Ann M.D.   On: 06/17/2019 19:10   CT ABDOMEN PELVIS W CONTRAST  Result Date: 06/14/2019 CLINICAL DATA:  Vomiting and diarrhea beginning this morning. Abdominal pain and tenderness with palpation. EXAM: CT ABDOMEN AND PELVIS WITH CONTRAST TECHNIQUE: Multidetector CT imaging of the abdomen and pelvis was performed using the standard protocol following bolus administration of intravenous contrast. CONTRAST:  11mL OMNIPAQUE IOHEXOL 300 MG/ML  SOLN COMPARISON:  04/23/2019 FINDINGS: Lower Chest: New small bilateral pleural effusions and dependent bibasilar atelectasis. Stable cardiomegaly. Hepatobiliary: No hepatic masses identified. Gallbladder is unremarkable. No evidence of biliary ductal dilatation. Pancreas:  No mass or inflammatory changes. Spleen: Within normal limits in size and appearance. Adrenals/Urinary Tract: Stable  bilateral adrenal masses, consistent with benign adenomas. Diffuse bilateral renal atrophy and tiny bilateral renal cysts remain stable. No evidence of ureteral calculi or hydronephrosis. Unremarkable unopacified urinary bladder. Stomach/Bowel: Diffuse duodenal wall thickening and mucosal enhancement has increased since previous  study, consistent with duodenitis. A small postbulbar duodenal ulcer is seen and decreased in size since previous study. No evidence of free intraperitoneal air. Vascular/Lymphatic: No pathologically enlarged lymph nodes. No abdominal aortic aneurysm. Aortic atherosclerosis noted. Reproductive: Prior hysterectomy noted. Adnexal regions are unremarkable in appearance. Other: Several small midline epigastric ventral hernias are again seen which contain only fat. No evidence of herniated bowel loops. Increased diffuse body wall edema since prior exam. Musculoskeletal:  No suspicious bone lesions identified. IMPRESSION: 1. Increased diffuse duodenal wall thickening and mucosal enhancement, consistent with duodenitis. 2. Decreased size of small postbulbar duodenal ulcer. 3. New small bilateral pleural effusions diffuse body wall edema. 4. Stable small epigastric ventral hernias containing only fat. 5. Stable benign bilateral adrenal adenomas. Aortic Atherosclerosis (ICD10-I70.0). Electronically Signed   By: Marlaine Hind M.D.   On: 06/14/2019 15:21   DG CHEST PORT 1 VIEW  Result Date: 06/30/2019 CLINICAL DATA:  GI bleed EXAM: PORTABLE CHEST 1 VIEW COMPARISON:  06/19/2019 FINDINGS: Right-sided hemodialysis catheter terminates at the level of the right atrium. Stable mild cardiomegaly. Atherosclerotic calcification of the aortic knob. Mild diffuse interstitial markings suggesting mild edema. No focal airspace consolidation. No pleural effusion or pneumothorax. IMPRESSION: Mildly prominent diffuse interstitial markings suggesting mild edema. Electronically Signed   By: Davina Poke D.O.   On: 06/30/2019 16:14   DG CHEST PORT 1 VIEW  Result Date: 06/19/2019 CLINICAL DATA:  End-stage renal disease.  Dialysis. EXAM: PORTABLE CHEST 1 VIEW COMPARISON:  Jun 17, 2019 FINDINGS: The dialysis catheter terminates near the caval atrial junction. No pneumothorax. No overt edema. No nodules or masses. No focal infiltrates.  There may be a small left effusion. The cardiomediastinal silhouette is stable. IMPRESSION: No overt edema. Stable dialysis catheter. There may be a small left effusion. Electronically Signed   By: Dorise Bullion III M.D   On: 06/19/2019 13:30   DG Chest Port 1 View  Result Date: 06/14/2019 CLINICAL DATA:  Weakness. Dialysis patient. EXAM: PORTABLE CHEST 1 VIEW COMPARISON:  Chest x-ray 05/22/2019 FINDINGS: The right IJ dialysis catheter is in good position, unchanged. The cardiac silhouette, mediastinal and hilar contours are within normal limits. Mild tortuosity and calcification of the thoracic aorta. No acute pulmonary findings. No pleural effusions. No pulmonary lesions. IMPRESSION: No acute cardiopulmonary findings. Electronically Signed   By: Marijo Sanes M.D.   On: 06/14/2019 10:49   ECHOCARDIOGRAM COMPLETE  Result Date: 06/20/2019    ECHOCARDIOGRAM REPORT   Patient Name:   ERCIE ELIASEN Date of Exam: 06/20/2019 Medical Rec #:  [id number]          Height:       62.0 in Accession #:    [id number]         Weight:       112.0 lb Date of Birth:  [birth year]           BSA:          1.494 m Patient Age:    67 years           BP:           157/59 mmHg Patient Gender: F                  HR:  82 bpm. Exam Location:  Inpatient Procedure: 2D Echo, Color Doppler and Cardiac Doppler Indications:    Stroke 434.91/ I163.9  History:        Patient has prior history of Echocardiogram examinations, most                 recent 01/25/2019. CAD, COPD, Arrythmias:Atrial Fibrillation;                 Risk Factors:Hypertension, Dyslipidemia and GERD. CKD.  Sonographer:    Jonelle Sidle Dance Referring Phys: [id number] ASHISH ARORA IMPRESSIONS  1. Left ventricular ejection fraction, by estimation, is 65 to 70%. The left ventricle has normal function. The left ventricle has no regional wall motion abnormalities. Left ventricular diastolic parameters are consistent with Grade II diastolic dysfunction (pseudonormalization).   2. Right ventricular systolic function is normal. The right ventricular size is normal. There is moderately elevated pulmonary artery systolic pressure. The estimated right ventricular systolic pressure is 43.1 mmHg.  3. Left atrial size was mild to moderately dilated.  4. Right atrial size was mildly dilated.  5. The mitral valve is normal in structure. Moderate mitral valve regurgitation. No evidence of mitral stenosis.  6. Tricuspid valve regurgitation is moderate.  7. The aortic valve is normal in structure. Aortic valve regurgitation is not visualized. No aortic stenosis is present. FINDINGS  Left Ventricle: Left ventricular ejection fraction, by estimation, is 65 to 70%. The left ventricle has normal function. The left ventricle has no regional wall motion abnormalities. The left ventricular internal cavity size was normal in size. There is  no left ventricular hypertrophy. Left ventricular diastolic parameters are consistent with Grade II diastolic dysfunction (pseudonormalization). Right Ventricle: The right ventricular size is normal. No increase in right ventricular wall thickness. Right ventricular systolic function is normal. There is moderately elevated pulmonary artery systolic pressure. The tricuspid regurgitant velocity is 3.35 m/s, and with an assumed right atrial pressure of 8 mmHg, the estimated right ventricular systolic pressure is 54.0 mmHg. Left Atrium: Left atrial size was mild to moderately dilated. Right Atrium: Right atrial size was mildly dilated. Pericardium: There is no evidence of pericardial effusion. Mitral Valve: The mitral valve is normal in structure. Moderate mitral valve regurgitation. No evidence of mitral valve stenosis. Tricuspid Valve: The tricuspid valve is grossly normal. Tricuspid valve regurgitation is moderate . No evidence of tricuspid stenosis. Aortic Valve: The aortic valve is normal in structure. Aortic valve regurgitation is not visualized. Aortic regurgitation PHT  measures 378 msec. No aortic stenosis is present. Pulmonic Valve: The pulmonic valve was normal in structure. Pulmonic valve regurgitation is trivial. Aorta: The aortic root and ascending aorta are structurally normal, with no evidence of dilitation. IAS/Shunts: The atrial septum is grossly normal.  LEFT VENTRICLE PLAX 2D LVIDd:         4.00 cm  Diastology LVIDs:         2.36 cm  LV e' lateral:   6.90 cm/s LV PW:         0.96 cm  LV E/e' lateral: 21.7 LV IVS:        0.91 cm  LV e' medial:    5.10 cm/s LVOT diam:     1.70 cm  LV E/e' medial:  29.4 LV SV:         45 LV SV Index:   30 LVOT Area:     2.27 cm  RIGHT VENTRICLE             IVC RV Basal  diam:  3.02 cm     IVC diam: 2.27 cm RV Mid diam:    1.83 cm RV S prime:     10.80 cm/s TAPSE (M-mode): 1.8 cm LEFT ATRIUM             Index       RIGHT ATRIUM           Index LA diam:        3.80 cm 2.54 cm/m  RA Area:     19.50 cm LA Vol (A2C):   74.0 ml 49.53 ml/m RA Volume:   54.70 ml  36.61 ml/m LA Vol (A4C):   54.7 ml 36.61 ml/m LA Biplane Vol: 64.4 ml 43.10 ml/m  AORTIC VALVE LVOT Vmax:   93.80 cm/s LVOT Vmean:  61.700 cm/s LVOT VTI:    0.199 m AI PHT:      378 msec  AORTA Ao Root diam: 3.00 cm Ao Asc diam:  2.70 cm MITRAL VALVE                 TRICUSPID VALVE MV Area (PHT): 2.76 cm      TR Peak grad:   44.9 mmHg MV Decel Time: 275 msec      TR Vmax:        335.00 cm/s MR Peak grad:    126.8 mmHg MR Mean grad:    86.0 mmHg   SHUNTS MR Vmax:         563.00 cm/s Systemic VTI:  0.20 m MR Vmean:        437.0 cm/s  Systemic Diam: 1.70 cm MR PISA:         0.57 cm MR PISA Eff ROA: 4 mm MR PISA Radius:  0.30 cm MV E velocity: 150.00 cm/s MV A velocity: 77.10 cm/s MV E/A ratio:  1.95 Mertie Moores MD Electronically signed by Mertie Moores MD Signature Date/Time: 06/20/2019/1:18:33 PM    Final      Phillips Climes M.D on 07/01/2019 at 2:52 PM    [facility] -  Office  [phone]

## 2019-07-01 NOTE — Progress Notes (Signed)
[facility] Progress Note    Since last GI note: Dark loose stools yesterday.  She is very hungry, eager to eat.  Currently has clear liquids ordered.  Objective: Vital signs in last 24 hours: Temp:  [97.6 F (36.4 C)-98.7 F (37.1 C)] 98.1 F (36.7 C) (06/04 0401) Pulse Rate:  [52-151] 70 (06/04 0420) Resp:  [10-36] 12 (06/04 0420) BP: (72-173)/(43-129) 112/97 (06/04 0400) SpO2:  [90 %-100 %] 90 % (06/04 0420) Weight:  [52 kg] 52 kg (06/04 0509) Last BM Date: 06/30/19 General: alert and oriented times 3 Heart: regular rate and rythm Abdomen: soft, non-tender, non-distended, normal bowel sounds   Lab Results: Recent Labs    06/29/19 1610 06/29/19 1610 06/30/19 0457 06/30/19 1311 06/30/19 1852  WBC 6.3  --  6.1 9.8  --   HGB 6.8*   < > 7.2* 9.8* 8.8*  PLT 261  --  257 166  --   MCV 94.1  --  93.3 88.8  --    < > = values in this interval not displayed.   Recent Labs    06/29/19 1610 06/30/19 0456  NA 138 137  K 5.0 5.5*  CL 103 108  CO2 17* 18*  GLUCOSE 101* 99  BUN 91* 110*  CREATININE 7.39* 7.25*  CALCIUM 8.1* 7.7*   Recent Labs    06/29/19 1610 06/30/19 0456  PROT 4.9*  --   ALBUMIN 2.2* 2.0*  AST 15  --   ALT 11  --   ALKPHOS 43  --   BILITOT 0.7  --    No results for input(s): INR in the last 72 hours.   Studies/Results: DG CHEST PORT 1 VIEW  Result Date: 06/30/2019 CLINICAL DATA:  GI bleed EXAM: PORTABLE CHEST 1 VIEW COMPARISON:  06/19/2019 FINDINGS: Right-sided hemodialysis catheter terminates at the level of the right atrium. Stable mild cardiomegaly. Atherosclerotic calcification of the aortic knob. Mild diffuse interstitial markings suggesting mild edema. No focal airspace consolidation. No pleural effusion or pneumothorax. IMPRESSION: Mildly prominent diffuse interstitial markings suggesting mild edema. Electronically Signed   By: Davina Poke D.O.   On: 06/30/2019 16:14     Medications: Scheduled Meds: . sodium  chloride   Intravenous Once  . sodium chloride   Intravenous Once  . allopurinol  100 mg Oral Daily  . amiodarone  200 mg Oral BID  . calcitRIOL  1 mcg Oral Q M,W,F-HD  . Chlorhexidine Gluconate Cloth  6 each Topical Q0600  . cholecalciferol  2,000 Units Oral Daily  . darbepoetin (ARANESP) injection - DIALYSIS  200 mcg Intravenous Q Wed-HD  . dicyclomine  10 mg Oral QHS  . docusate sodium  100 mg Oral BID  . ferric citrate  630 mg Oral TID WC  . isosorbide mononitrate  30 mg Oral QHS  . lidocaine  1 application Topical Once  . metoprolol tartrate  25 mg Oral BID  . mometasone-formoterol  2 puff Inhalation BID  . pantoprazole (PROTONIX) IV  40 mg Intravenous Q12H  . pravastatin  80 mg Oral QPM  . sodium chloride flush  3 mL Intravenous Q12H  . sucralfate  1 g Oral Q6H   Continuous Infusions: . sodium chloride    . sodium chloride    . ferric gluconate (FERRLECIT/NULECIT) IV Stopped (06/30/19 0308)   PRN Meds:.sodium chloride, sodium chloride, acetaminophen **OR** acetaminophen, alteplase, clobetasol cream, diphenhydrAMINE, fentaNYL (SUBLIMAZE) injection, heparin sodium (porcine), ipratropium-albuterol, lidocaine (PF), lidocaine-prilocaine, lidocaine-prilocaine, nitroGLYCERIN, ondansetron, pentafluoroprop-tetrafluoroeth, polyethylene glycol, senna-docusate  Assessment/Plan: 69 y.o. female with recurrent GI bleeding.  She's had 3 units of PRBCs this admission (since 6/2) with an appropriate 'bump' in Hb from 6.8 to 9.8 (last checked yesterday).  The recurrent melena is almost certainly from her severe duodenal ulcerations that have been noted twice now by EGD 05/2019 and 05/2019.  She admitted to extreme NSAID overuse last month (10-12 ibuprofen daily as well as Alleve at times).   Her last plavix dose was 6/2.  Should continue to hold it for now.  LIkely to proceed Monday or Tuesday with repeat EGD.  Continue IV PPI BID for now.  I am going to advance her diet to cardiac and will  order repeat CBC for the morning.  [name], MD  07/01/2019, 8:19 AM [facility] Pager [phone]

## 2019-07-01 NOTE — [place] FL2 (Signed)
[place] MEDICAID FL2 LEVEL OF CARE SCREENING TOOL     IDENTIFICATION  Patient Name: Christina [name] Birthdate: Jun 01, 1950 Sex: female Admission Date (Current Location): 06/29/2019  [facility] and [place] Number:  [employer] and Address:  [place]. [facility], [place] [address], [place], [place] [address]      Provider Number: [id number]  Attending Physician Name and Address:  Albertine Patricia, MD  Relative Name and Phone Number:  [name] daughter, [phone]    Current Level of Care: Hospital Recommended Level of Care: [place] Prior Approval Number:    Date Approved/Denied:   PASRR Number: [id number]  Discharge Plan: SNF    Current Diagnoses: Patient Active Problem List   Diagnosis Date Noted  . GI bleed 06/30/2019  . Cerebral embolism with cerebral infarction 06/19/2019  . Atrial fibrillation with rapid ventricular response ([place]) 06/14/2019  . Incomplete rectal prolapse 06/14/2019  . Respiratory failure with hypoxia ([place]) 05/10/2019  . Pneumonia 05/10/2019  . 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 Duodenal ulcer perforation ([place]) 04/23/2019  . Cocaine abuse ([place]) 04/23/2019  . Acute gastric ulcer without hemorrhage or perforation   . Hematemesis 04/04/2019  . Atrial fibrillation with RVR ([place])   . Duodenal ulcer   . Gastritis and gastroduodenitis   . Acute blood loss anemia   . Ischemic bowel disease ([place]) 03/25/2019  . RUQ pain   . Anemia due to chronic kidney disease, on chronic dialysis ([place])   . AF (paroxysmal atrial fibrillation) ([place])   . Chest pain 03/19/2019  . Hyperkalemia 02/28/2019  . Uremia 02/27/2019  . Volume overload 02/21/2019  . Abdominal pain 02/21/2019  . Non-compliance with renal dialysis ([place]) 02/21/2019  . Respiratory failure ([place]) 02/04/2019  . Acute and chr resp failure, unsp w hypoxia or hypercapnia ([place])   . Afib ([place]) 01/25/2019  . SBO (small bowel obstruction) ([place])  08/10/2018  . Anemia due to chronic blood loss   . Heme positive stool   . Platelet inhibition due to Plavix   . Acute GI bleeding 05/06/2018  . ESRD (end stage renal disease) ([place]) 05/06/2018  . Anxiety 05/06/2018  . Bipolar affective ([place]) 05/06/2018  . CAD (coronary artery disease), native coronary artery 05/06/2018  . COPD with chronic bronchitis ([place]) 05/06/2018  . CVA (cerebral vascular accident) ([place]) 05/06/2018  . Essential hypertension 05/06/2018  . Hyperlipidemia 05/06/2018    Orientation RESPIRATION BLADDER Height & Weight     Self, Place, Situation  Normal Continent Weight: 114 lb 10.2 oz (52 kg) Height:  5\' 2"  (157.5 cm)  BEHAVIORAL SYMPTOMS/MOOD NEUROLOGICAL BOWEL NUTRITION STATUS      Continent Diet(Please see DC Summary)  AMBULATORY STATUS COMMUNICATION OF NEEDS Skin   Extensive Assist Verbally Other (Comment)(wound on toe)                       Personal Care Assistance Level of Assistance  Bathing, Dressing, Feeding Bathing Assistance: Maximum assistance Feeding assistance: Independent Dressing Assistance: Maximum assistance     Functional Limitations Info  Hearing, Speech, Sight Sight Info: Impaired Hearing Info: Adequate Speech Info: Adequate    SPECIAL CARE FACTORS FREQUENCY  PT (By licensed PT), OT (By licensed OT)     PT Frequency: 5x/week OT Frequency: 5x/week            Contractures Contractures Info: Not present    Additional Factors Info  Code Status, Allergies Code Status Info: DNR Allergies Info: Betaine,  Cyclobenzaprine, Morphine, Penicillins, Ambien (Zolpidem), Codeine, Hydromorphone           Current Medications (07/01/2019):  This is the current hospital active medication list Current Facility-Administered Medications  Medication Dose Route Frequency Provider Last Rate Last Admin  . 0.9 %  sodium chloride infusion (Manually program via Guardrails IV Fluids)   Intravenous Once [name] A, PA-C      . 0.9 %  sodium  chloride infusion (Manually program via Guardrails IV Fluids)   Intravenous Once Roney Jaffe, MD      . 0.9 %  sodium chloride infusion  100 mL Intravenous PRN Madelon Lips, MD      . 0.9 %  sodium chloride infusion  100 mL Intravenous PRN Madelon Lips, MD      . acetaminophen (TYLENOL) tablet 650 mg  650 mg Oral Q6H PRN Norval Morton, MD       Or  . acetaminophen (TYLENOL) suppository 650 mg  650 mg Rectal Q6H PRN Fuller Plan A, MD      . allopurinol (ZYLOPRIM) tablet 100 mg  100 mg Oral Daily [name] A, PA-C   100 mg at 07/01/19 0948  . alteplase (CATHFLO ACTIVASE) injection 2 mg  2 mg Intracatheter Once PRN Madelon Lips, MD      . amiodarone (PACERONE) tablet 200 mg  200 mg Oral BID [name] A, PA-C   200 mg at 07/01/19 0949  . calcitRIOL (ROCALTROL) capsule 1 mcg  1 mcg Oral Q M,W,F-HD Alric Seton, PA-C   1 mcg at 06/30/19 1054  . Chlorhexidine Gluconate Cloth 2 % PADS 6 each  6 each Topical Q0600 Madelon Lips, MD   6 each at 07/01/19 0135  . cholecalciferol (VITAMIN D3) tablet 2,000 Units  2,000 Units Oral Daily Garald Balding, PA-C   2,000 Units at 07/01/19 0102  . clobetasol cream (TEMOVATE) 7.25 % 1 application  1 application Topical Daily PRN Garald Balding, PA-C      . Darbepoetin Alfa (ARANESP) injection 200 mcg  200 mcg Intravenous Q Wed-HD Madelon Lips, MD   Stopped at 06/30/19 [phone]  . dicyclomine (BENTYL) capsule 10 mg  10 mg Oral QHS [name] A, PA-C   10 mg at 06/30/19 2158  . diphenhydrAMINE (BENADRYL) capsule 25 mg  25 mg Oral QHS PRN Garald Balding, PA-C   25 mg at 06/30/19 2204  . docusate sodium (COLACE) capsule 100 mg  100 mg Oral BID [name] A, PA-C   100 mg at 07/01/19 0948  . fentaNYL (SUBLIMAZE) injection 25 mcg  25 mcg Intravenous Q2H PRN Fuller Plan A, MD   25 mcg at 07/01/19 0929  . ferric citrate (AURYXIA) tablet 630 mg  630 mg Oral TID WC Belaya, Maria A, PA-C   630 mg at 07/01/19 0946  . ferric gluconate  (NULECIT) 125 mg in sodium chloride 0.9 % 100 mL IVPB  125 mg Intravenous Q M,W,F Madelon Lips, MD   Stopped at 06/30/19 0308  . heparin sodium (porcine) injection 1,000 Units  1,000 Units Intracatheter PRN Madelon Lips, MD   3,200 Units at 06/30/19 1050  . ipratropium-albuterol (DUONEB) 0.5-2.5 (3) MG/3ML nebulizer solution 3 mL  3 mL Inhalation QID PRN [name] A, PA-C      . isosorbide mononitrate (IMDUR) 24 hr tablet 30 mg  30 mg Oral QHS Belaya, Maria A, PA-C   30 mg at 06/30/19 2159  . lidocaine (PF) (XYLOCAINE) 1 % injection 5 mL  5 mL Intradermal PRN Madelon Lips, MD      . lidocaine (XYLOCAINE) 2 % jelly 1 application  1 application Topical Once Smith, Rondell A, MD      . lidocaine-prilocaine (EMLA) cream 1 application  1 application Topical Daily PRN [name] A, PA-C      . lidocaine-prilocaine (EMLA) cream 1 application  1 application Topical PRN Madelon Lips, MD      . metoprolol tartrate (LOPRESSOR) tablet 25 mg  25 mg Oral BID Fuller Plan A, MD   25 mg at 07/01/19 0949  . mometasone-formoterol (DULERA) 200-5 MCG/ACT inhaler 2 puff  2 puff Inhalation BID Garald Balding, PA-C   2 puff at 07/01/19 0914  . nitroGLYCERIN (NITROSTAT) SL tablet 0.4 mg  0.4 mg Sublingual Q5 min PRN [name] A, PA-C      . ondansetron (ZOFRAN) tablet 4 mg  4 mg Oral TID PRN [name] A, PA-C   4 mg at 06/30/19 2223  . pantoprazole (PROTONIX) 80 mg in sodium chloride 0.9 % 100 mL (0.8 mg/mL) infusion  8 mg/hr Intravenous Continuous Elgergawy, Silver Huguenin, MD      . pantoprazole (PROTONIX) 80 mg in sodium chloride 0.9 % 100 mL IVPB  80 mg Intravenous Once Elgergawy, Silver Huguenin, MD      . Derrill Memo ON 07/04/2019] pantoprazole (PROTONIX) injection 40 mg  40 mg Intravenous Q12H Elgergawy, Silver Huguenin, MD      . pentafluoroprop-tetrafluoroeth (GEBAUERS) aerosol 1 application  1 application Topical PRN Madelon Lips, MD      . polyethylene glycol (MIRALAX / GLYCOLAX) packet 17 g  17 g Oral  BID PRN [name] A, PA-C      . pravastatin (PRAVACHOL) tablet 80 mg  80 mg Oral QPM Belaya, Maria A, PA-C   80 mg at 06/30/19 2157  . senna-docusate (Senokot-S) tablet 1 tablet  1 tablet Oral BID PRN [name] A, PA-C      . sodium chloride flush (NS) 0.9 % injection 3 mL  3 mL Intravenous Q12H Smith, Rondell A, MD   3 mL at 07/01/19 0914  . sucralfate (CARAFATE) 1 GM/10ML suspension 1 g  1 g Oral Q6H Belaya, Maria A, PA-C   1 g at 07/01/19 5093     Discharge Medications: Please see discharge summary for a list of discharge medications.  Relevant Imaging Results:  Relevant Lab Results:   Additional Information HD at [facility]: T/TH/S 11:35am chair time; SS# [id number]  [place], LCSW

## 2019-07-01 NOTE — Progress Notes (Addendum)
Progress Note    07/01/2019 9:44 AM * No surgery found *  Subjective:  Hospital course reviewed. Patient with known history of peripheral vascular disease admitted secondary to GIB and findings from previous admissions of severe duodenal ulcer disease.  Consulted for lower extremity pain/ischemia, chronic in nature. Plavix discontinued after last dose two days ago. Has received 3u PCs this admission.  Complaining of intermittent burning pain from both heels to forefeet and painful to touch.  S/p SMA stent by Dr. Carlis Abbott 04/27/2019 S/p LUE AVF at [facility] Sept 2019. HD on TTS at [facility]:   07/01/19 0420 07/01/19 0914  BP:  (!) 142/62  Pulse: 70 76  Resp: 12 20  Temp:    SpO2: 90% 97%    Physical Exam: Cardiac:  RRR with occasional skipped beat. Tele: a. fib Lungs:  Non-labored Extremities:  She has biphasic Doppler signals of right and left DP, PT, peroneal and signals at 1st-2nd toe webspaces.  Ischemic changes of toes bilaterally without cellulitis or drainage. Good bruit in LUE AVF. (Has TDC right IJ)             CBC    Component Value Date/Time   WBC 6.3 07/01/2019 0617   RBC 2.65 (L) 07/01/2019 0617   HGB 7.9 (L) 07/01/2019 0617   HCT 23.8 (L) 07/01/2019 0617   PLT 177 07/01/2019 0617   MCV 89.8 07/01/2019 0617   MCH 29.8 07/01/2019 0617   MCHC 33.2 07/01/2019 0617   RDW 15.7 (H) 07/01/2019 0617   LYMPHSABS 0.9 06/29/2019 1610   MONOABS 0.5 06/29/2019 1610   EOSABS 0.1 06/29/2019 1610   BASOSABS 0.1 06/29/2019 1610    BMET    Component Value Date/Time   NA 137 06/30/2019 0456   K 5.5 (H) 06/30/2019 0456   CL 108 06/30/2019 0456   CO2 18 (L) 06/30/2019 0456   GLUCOSE 99 06/30/2019 0456   BUN 110 (H) 06/30/2019 0456   CREATININE 7.25 (H) 06/30/2019 0456   CALCIUM 7.7 (L) 06/30/2019 0456   GFRNONAA 5 (L) 06/30/2019 0456   GFRAA 6 (L) 06/30/2019 0456     Intake/Output Summary (Last 24 hours) at 07/01/2019 0944 Last data filed at  07/01/2019 0500 Gross per 24 hour  Intake 726 ml  Output --  Net 726 ml    HOSPITAL MEDICATIONS Scheduled Meds: . docusate sodium      . sodium chloride   Intravenous Once  . sodium chloride   Intravenous Once  . allopurinol  100 mg Oral Daily  . amiodarone  200 mg Oral BID  . calcitRIOL  1 mcg Oral Q M,W,F-HD  . Chlorhexidine Gluconate Cloth  6 each Topical Q0600  . cholecalciferol  2,000 Units Oral Daily  . darbepoetin (ARANESP) injection - DIALYSIS  200 mcg Intravenous Q Wed-HD  . dicyclomine  10 mg Oral QHS  . docusate sodium  100 mg Oral BID  . ferric citrate  630 mg Oral TID WC  . isosorbide mononitrate  30 mg Oral QHS  . lidocaine  1 application Topical Once  . metoprolol tartrate  25 mg Oral BID  . mometasone-formoterol  2 puff Inhalation BID  . pantoprazole (PROTONIX) IV  40 mg Intravenous Q12H  . pravastatin  80 mg Oral QPM  . sodium chloride flush  3 mL Intravenous Q12H  . sucralfate  1 g Oral Q6H   Continuous Infusions: . sodium chloride    . sodium chloride    . ferric  gluconate (FERRLECIT/NULECIT) IV Stopped (06/30/19 0308)   PRN Meds:.sodium chloride, sodium chloride, acetaminophen **OR** acetaminophen, alteplase, clobetasol cream, diphenhydrAMINE, fentaNYL (SUBLIMAZE) injection, heparin sodium (porcine), ipratropium-albuterol, lidocaine (PF), lidocaine-prilocaine, lidocaine-prilocaine, nitroGLYCERIN, ondansetron, pentafluoroprop-tetrafluoroeth, polyethylene glycol, senna-docusate  Assessment: PAD: Intermittent bilateral foot rest pain. Ischemic changes to toes without gangrene.  UGIB: drop in Hgb this morning to 7.9 from post-transfusion level of 9.8, then 8.8 on recheck yesterday. VSS.  Diet advanced.  Plan: -Possible EGD [facility] next week.  Hgb not yet stabilizing. Arteriogram timing to be determined. -DVT prophylaxis:  SCDs   Risa Grill, PA-C [facility] [phone] 07/01/2019  9:44 AM   Following with you.  Chronic ischemic  changes.  Will need arteriography when GI issues resolved

## 2019-07-02 LAB — BPAM RBC
Blood Product Expiration Date: 202106092359
Blood Product Expiration Date: 202106232359
Blood Product Expiration Date: 202106232359
Blood Product Expiration Date: 202106242359
ISSUE DATE / TIME: 202106022102
ISSUE DATE / TIME: 202106030851
ISSUE DATE / TIME: 202106030851
ISSUE DATE / TIME: 202106041748
Unit Type and Rh: 6200
Unit Type and Rh: 6200
Unit Type and Rh: 6200
Unit Type and Rh: 6200

## 2019-07-02 LAB — HEPATITIS B SURFACE ANTIBODY,QUALITATIVE: Hep B S Ab: NONREACTIVE

## 2019-07-02 LAB — RENAL FUNCTION PANEL
Albumin: 2 g/dL — ABNORMAL LOW (ref 3.5–5.0)
Anion gap: 14 (ref 5–15)
BUN: 64 mg/dL — ABNORMAL HIGH (ref 8–23)
CO2: 20 mmol/L — ABNORMAL LOW (ref 22–32)
Calcium: 8 mg/dL — ABNORMAL LOW (ref 8.9–10.3)
Chloride: 99 mmol/L (ref 98–111)
Creatinine, Ser: 5.19 mg/dL — ABNORMAL HIGH (ref 0.44–1.00)
GFR calc Af Amer: 9 mL/min — ABNORMAL LOW (ref >60)
GFR calc non Af Amer: 8 mL/min — ABNORMAL LOW (ref >60)
Glucose, Bld: 110 mg/dL — ABNORMAL HIGH (ref 70–99)
Phosphorus: 5.3 mg/dL — ABNORMAL HIGH (ref 2.5–4.6)
Potassium: 5.2 mmol/L — ABNORMAL HIGH (ref 3.5–5.1)
Sodium: 133 mmol/L — ABNORMAL LOW (ref 135–145)

## 2019-07-02 LAB — CBC
HCT: 27.1 % — ABNORMAL LOW (ref 36.0–46.0)
Hemoglobin: 9 g/dL — ABNORMAL LOW (ref 12.0–15.0)
MCH: 29.8 pg (ref 26.0–34.0)
MCHC: 33.2 g/dL (ref 30.0–36.0)
MCV: 89.7 fL (ref 80.0–100.0)
Platelets: 178 10*3/uL (ref 150–400)
RBC: 3.02 MIL/uL — ABNORMAL LOW (ref 3.87–5.11)
RDW: 15.1 % (ref 11.5–15.5)
WBC: 7.3 10*3/uL (ref 4.0–10.5)
nRBC: 0 % (ref 0.0–0.2)

## 2019-07-02 LAB — TYPE AND SCREEN
ABO/RH(D): A POS
Antibody Screen: NEGATIVE
Unit division: 0
Unit division: 0
Unit division: 0
Unit division: 0

## 2019-07-02 LAB — RAPID URINE DRUG SCREEN, HOSP PERFORMED
Amphetamines: NOT DETECTED
Barbiturates: NOT DETECTED
Benzodiazepines: NOT DETECTED
Cocaine: NOT DETECTED
Opiates: NOT DETECTED
Tetrahydrocannabinol: NOT DETECTED

## 2019-07-02 LAB — HEMOGLOBIN AND HEMATOCRIT, BLOOD
HCT: 26.4 % — ABNORMAL LOW (ref 36.0–46.0)
Hemoglobin: 8.6 g/dL — ABNORMAL LOW (ref 12.0–15.0)

## 2019-07-02 LAB — HEPATITIS B SURFACE ANTIGEN: Hepatitis B Surface Ag: NONREACTIVE

## 2019-07-02 MED ORDER — DARBEPOETIN ALFA 200 MCG/0.4ML IJ SOSY
PREFILLED_SYRINGE | INTRAMUSCULAR | Status: AC
  Filled 2019-07-02: qty 0.4

## 2019-07-02 MED ORDER — HALOPERIDOL LACTATE 5 MG/ML IJ SOLN: 4.0000 mg | Freq: Once | INTRAMUSCULAR | Status: DC

## 2019-07-02 MED ORDER — HALOPERIDOL LACTATE 5 MG/ML IJ SOLN
2.0000 mg | INTRAMUSCULAR | Status: DC
  Filled 2019-07-02: qty 1

## 2019-07-02 MED ORDER — OXYCODONE-ACETAMINOPHEN 5-325 MG PO TABS
ORAL_TABLET | ORAL | Status: AC
  Filled 2019-07-02: qty 1

## 2019-07-02 MED ORDER — HALOPERIDOL LACTATE 5 MG/ML IJ SOLN
2.0000 mg | Freq: Once | INTRAMUSCULAR | Status: AC
  Administered 2019-07-02: 2 mg via INTRAMUSCULAR

## 2019-07-02 NOTE — Progress Notes (Signed)
[facility] Progress Note   Dialysis Orders: [facility] T[facility] 4 hr EDW 48 kg is ?? F180 BFR 350 mL/ min DFR A1.5 UF profile 2no heparin Venofer 100 mg q rx x 9, started 5/27 Mircera 200 q 2 weeks, last given 06/02/19 Calcitriol 1.0 cmg q rx  Problem/Plan: 1.Recurrent GIB - melanoic stools on HD with abdominal pain -s/p 4 units PRBC seen by GI - presumed bleeding from previously diagnosed severe duodenal ulcerations - Plavix on hold- for repeat EGD next week- continue PPI bid - 2. ESRD-  TTS  AVF used today 2 needles Qb 250 no problems with cannulation.- plan extra treatment Monday to titrate volume down - work around EGD 3. Anemia- hgb 6.8 > 7.2>9 6/4  - behind on ESA dosing heme neg- redose ESA yest  Not given , give Aranesp sat 6/5 hd  continue course of Fe - recent acute GIB in May with multiple deep duodenal ulcers - PPI indefinitely 4. Secondary hyperparathyroidism- resumed calcitriol 5.HTN/volumeHad excess volume on admit  titrating down -  Generalized edema - EDW was raised from 40.5 > 48 after last admission - post wt prior to last admission was 44 at the most -  need to tritrate EDW back down some what.-  6. Nutrition- alb very low  change to renal diet 7. Mesenteric ischemia s/p SMA stent 05/2019 8. PAD -ischemic left 4th and 5th toes Dr Early seeing- planning for arteriogram when able 9. Atrial fibrillation -no anticoag due to GIB - - on amiodarone, SR  On exam   10. Hx cocaine abuse - UDS negative  11. 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 DNR  13. Disp - tentative SNF   Myriam Jacobson, PA-C [facility] Beeper [phone] 07/02/2019,9:00 AM  LOS: 2 days   Subjective:   Better today  - "pooped all day yesterday"  Objective Vitals:   07/01/19 2034 07/01/19 2105 07/02/19 0040 07/02/19 0413  BP:  134/84    Pulse:  72    Resp: 16 14    Temp:   98.1 F (36.7 C) 97.9 F (36.6 C)  TempSrc:   Oral Oral  SpO2:       Weight:    56.8 kg  Height:       Physical Exam General: NAD on HD spirits good - generalized edema Heart:  RRR  Lungs: dim bases  Abdomen: soft NT  Extremities:2+ LE edema - puffy feet Dialysis Access: left upper AVF Qb 250   Additional Objective Labs: Basic Metabolic Panel: Recent Labs  Lab 06/29/19 1610 06/30/19 0456 07/01/19 0617  NA 138 137 136  K 5.0 5.5* 4.3  CL 103 108 101  CO2 17* 18* 23  GLUCOSE 101* 99 85  BUN 91* 110* 52*  CREATININE 7.39* 7.25* 4.18*  CALCIUM 8.1* 7.7* 7.6*  PHOS  --  8.3*  --    Liver Function Tests: Recent Labs  Lab 06/29/19 1610 06/30/19 0456  AST 15  --   ALT 11  --   ALKPHOS 43  --   BILITOT 0.7  --   PROT 4.9*  --   ALBUMIN 2.2* 2.0*   No results for input(s): LIPASE, AMYLASE in the last 168 hours. CBC: Recent Labs  Lab 06/29/19 1610 06/29/19 1610 06/30/19 0457 06/30/19 0457 06/30/19 1311 06/30/19 1852 07/01/19 0617 07/01/19 0617 07/01/19 1549 07/01/19 2358 07/02/19 0719  WBC 6.3   < > 6.1   < > 9.8  --  6.3  --   --  7.3  --   NEUTROABS 4.7  --   --   --   --   --   --   --   --   --   --   HGB 6.8*   < > 7.2*   < > 9.8*   < > 7.9*   < > 7.8* 9.0* 8.6*  HCT 22.5*   < > 23.7*   < > 29.2*   < > 23.8*   < > 23.4* 27.1* 26.4*  MCV 94.1  --  93.3  --  88.8  --  89.8  --   --  89.7  --   PLT 261   < > 257   < > 166  --  177  --   --  178  --    < > = values in this interval not displayed.   Blood Culture    Component Value Date/Time   SDES BLOOD RIGHT HAND 06/17/2019 1223   [place]  06/17/2019 1223    BOTTLES DRAWN AEROBIC ONLY Blood Culture results may not be optimal due to an inadequate volume of blood received in culture bottles   CULT  06/17/2019 1223    NO GROWTH 5 DAYS Performed at [facility], [place] [address] [place], [place] [address]    REPTSTATUS 06/22/2019 FINAL 06/17/2019 1223    Cardiac Enzymes: No results for input(s): CKTOTAL, CKMB, CKMBINDEX, TROPONINI in the last 168  hours. CBG: No results for input(s): GLUCAP in the last 168 hours. Iron Studies: No results for input(s): IRON, TIBC, TRANSFERRIN, FERRITIN in the last 72 hours. Lab Results  Component Value Date   INR 1.2 06/14/2019   INR 1.1 03/25/2019   INR 0.9 01/26/2019   Studies/Results: DG CHEST PORT 1 VIEW  Result Date: 06/30/2019 CLINICAL DATA:  GI bleed EXAM: PORTABLE CHEST 1 VIEW COMPARISON:  06/19/2019 FINDINGS: Right-sided hemodialysis catheter terminates at the level of the right atrium. Stable mild cardiomegaly. Atherosclerotic calcification of the aortic knob. Mild diffuse interstitial markings suggesting mild edema. No focal airspace consolidation. No pleural effusion or pneumothorax. IMPRESSION: Mildly prominent diffuse interstitial markings suggesting mild edema. Electronically Signed   By: Davina Poke D.O.   On: 06/30/2019 16:14   Medications: . sodium chloride    . sodium chloride    . ferric gluconate (FERRLECIT/NULECIT) IV 125 mg (07/01/19 1231)  . pantoprozole (PROTONIX) infusion 8 mg/hr (07/02/19 0000)   . sodium chloride   Intravenous Once  . sodium chloride   Intravenous Once  . allopurinol  100 mg Oral Daily  . amiodarone  200 mg Oral BID  . calcitRIOL  1 mcg Oral Q M,W,F-HD  . Chlorhexidine Gluconate Cloth  6 each Topical Q0600  . cholecalciferol  2,000 Units Oral Daily  . darbepoetin (ARANESP) injection - DIALYSIS  200 mcg Intravenous Q Sat-HD  . dicyclomine  10 mg Oral QHS  . docusate sodium  100 mg Oral BID  . feeding supplement (NEPRO CARB STEADY)  237 mL Oral BID BM  . ferric citrate  630 mg Oral TID WC  . isosorbide mononitrate  30 mg Oral QHS  . metoprolol tartrate  25 mg Oral BID  . mometasone-formoterol  2 puff Inhalation BID  . [START ON 07/04/2019] pantoprazole  40 mg Intravenous Q12H  . pravastatin  80 mg Oral QPM  . sodium chloride flush  3 mL Intravenous Q12H  . sucralfate  1 g Oral Q6H

## 2019-07-02 NOTE — Progress Notes (Signed)
PROGRESS NOTE                                                                                                                                                                                                             Patient Demographics:    Christina [name], is a 69 y.o. female, DOB - 06-08-1950, KAJ:[id number]  Admit date - 06/29/2019   Admitting Physician Norval Morton, MD  Outpatient Primary MD for the patient is Nodal, [name], PA-C  LOS - 2   Chief Complaint  Patient presents with  . Rectal Problems       Brief Narrative    Christina [name] is a 69 y.o. female with medical history significant of HTN, HLD, CAD, A. fib, COPD, ESRD on HD(TTS), SBO, rectal prolapse, and polysubstance abuse including cocaine who initially presented with complaints of rectal pain progressively worsening over the last 4 days.  Patient missed her hemodialysis yesterday due to her pain.  Notes associated complaints of shortness of breath, leg swelling, epigastric abdominal and bilateral feet pain.  She is followed by Dr. Oneida Alar of vascular surgery appears to have been contemplating amputating her toes.  Patient admits that she has had several bloody bowel movements while she has been here in the hospital.  She adamantly denies any recent cocaine use.   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  Hospitalized 4/13-4/15 with acute respiratory failure secondary to volume overload and COPD exacerbation.  4/25-4/27 at [facility] for with A. fib and AVR no longer on anticoagulation due to large duodenal ulcer.  5/8-5/9 hospitalized for peripheral artery disease and gangrene of left foot for which she was discharged to follow-up with Dr. Oneida Alar.  Lastly hospitalized from 5/18-5/24  with GI bleed found to have several deep duodenal ulcers without active signs of bleeding.   ED Course: Upon admission into the emergency department patient was seen to be afebrile with pulse 52-151, blood pressures as low as 76/52 with improvement after 2 units of PRBCs during her hemodialysis.  Patient had initially been taken to hemodialysis, but returned to the emergency department after being noted to have several bowel movements with blood clots present.  Labs significant for hemoglobin 7.2-> 9.8 after 2 units of PRBCs, potassium 5.5, CO2 18, BUN 110, and creatinine 7.25.  [facility] have been formally consulted.     Subjective:    Christina [name] today reports multiple bowel movement with melena overnight, complaining of pain in her toes, no nausea, no vomiting.   Assessment  & Plan :    Active Problems:   GI bleed   GI bleed, acute blood loss anemia:  -Is most likely related to upper GI bleed, with known history of duodenal ulcer . -She was transfused 3 units PRBC, hemoglobin has responded appropriately, but it is trending down, she was transfused another unit yesterday, with good response, hemoglobin is 9 this morning . -Continue to hold aspirin and Plavix. -Plan for endoscopy, timing per GI. -New with PPI, given she still having melena, and her hemoglobin is dropping, will continue with Protonix drip. -Diet has been advanced by GI. -Continue with close H&H monitoring.  Transient hypotension:  -Resolved, secondary to acute blood loss anemia .  ESRD on HD:  - renal consulted   Rectal prolapse: Acute on chronic -Lidocaine jelly as needed   Paroxysmal atrial fibrillation:  -  Not on anticoagulation due to history of GI bleeds. -Continue amiodarone and metoprolol  Peripheral vascular disease with ischemic toes without gangrene -Neurosurgery input greatly appreciated, plan radiogram, timing by vascular surgery -Holding aspirin and Plavix due to acute  bleed  COPD, without acute exacerbation. -Continue pharmacy substitution of Dulera and duo nebs as needed for shortness of breath/wheezing  Polysubstance abuse:  -Urine drug screen is negative this admission  Hyperlipidemia -Continue pravastatin   COVID-19 Labs  No results for input(s): DDIMER, FERRITIN, LDH, CRP in the last 72 hours.  Lab Results  Component Value Date   SARSCOV2NAA NEGATIVE 06/29/2019   [place] NEGATIVE 06/14/2019   [place] NEGATIVE 05/10/2019   [place] NEGATIVE 04/23/2019     Code Status : DNR  Family Communication  : None at bedside  Disposition Plan  :  Status is: Inpatient  Remains inpatient appropriate because:Hemodynamically unstable   Dispo: The patient is from: Home              Anticipated d/c is to: SNF              Anticipated d/c date is: > 3 days              Patient currently is not medically stable to d/c.    Consults  :  Renal,GI,Vascular surgery  Procedures  : none  DVT Prophylaxis  :  SCD  Lab Results  Component Value Date   PLT 178 07/01/2019    Antibiotics  :    Anti-infectives (From admission, onward)   None        Objective:   Vitals:   07/02/19 1030 07/02/19 1100 07/02/19 1130 07/02/19 1301  BP: (!) 200/60 (!) 190/67 (!) 185/76 (!) 151/54  Pulse: 69 66 71 73  Resp: 14 14 15 16   Temp:    98 F (36.7 C)  TempSrc:    Oral  SpO2:    95%  Weight:      Height:        Wt Readings from Last 3 Encounters:  07/02/19 54.3 kg  06/20/19 50.8 kg  05/12/19 41.9 kg     Intake/Output Summary (Last 24 hours) at 07/02/2019 1419 Last data filed at 07/02/2019 1300 Gross per 24 hour  Intake 1191.66 ml  Output --  Net  1191.66 ml     Physical Exam  Awake Alert, Oriented X 3, No new F.N deficits, Normal affect Symmetrical Chest wall movement, Good air movement bilaterally, CTAB RRR,No Gallops,Rubs or new Murmurs, No Parasternal Heave +ve B.Sounds, Abd Soft, No tenderness, No rebound - guarding  or rigidity. Patient with ischemic changes of her toes bilaterally, without cellulitis or drainage, please see pictures below.      Data Review:    CBC Recent Labs  Lab 06/29/19 1610 06/29/19 1610 06/30/19 0457 06/30/19 0457 06/30/19 1311 06/30/19 1311 06/30/19 1852 07/01/19 0617 07/01/19 1549 07/01/19 2358 07/02/19 0719  WBC 6.3  --  6.1  --  9.8  --   --  6.3  --  7.3  --   HGB 6.8*   < > 7.2*   < > 9.8*   < > 8.8* 7.9* 7.8* 9.0* 8.6*  HCT 22.5*   < > 23.7*   < > 29.2*   < > 26.2* 23.8* 23.4* 27.1* 26.4*  PLT 261  --  257  --  166  --   --  177  --  178  --   MCV 94.1  --  93.3  --  88.8  --   --  89.8  --  89.7  --   MCH 28.5  --  28.3  --  29.8  --   --  29.8  --  29.8  --   MCHC 30.2  --  30.4  --  33.6  --   --  33.2  --  33.2  --   RDW 17.6*  --  16.6*  --  14.9  --   --  15.7*  --  15.1  --   LYMPHSABS 0.9  --   --   --   --   --   --   --   --   --   --   MONOABS 0.5  --   --   --   --   --   --   --   --   --   --   EOSABS 0.1  --   --   --   --   --   --   --   --   --   --   BASOSABS 0.1  --   --   --   --   --   --   --   --   --   --    < > = values in this interval not displayed.    Chemistries  Recent Labs  Lab 06/29/19 1610 06/30/19 0456 07/01/19 0617  NA 138 137 136  K 5.0 5.5* 4.3  CL 103 108 101  CO2 17* 18* 23  GLUCOSE 101* 99 85  BUN 91* 110* 52*  CREATININE 7.39* 7.25* 4.18*  CALCIUM 8.1* 7.7* 7.6*  AST 15  --   --   ALT 11  --   --   ALKPHOS 43  --   --   BILITOT 0.7  --   --    ------------------------------------------------------------------------------------------------------------------ No results for input(s): CHOL, HDL, LDLCALC, TRIG, CHOLHDL, LDLDIRECT in the last 72 hours.  Lab Results  Component Value Date   HGBA1C 4.9 06/19/2019   ------------------------------------------------------------------------------------------------------------------ No results for input(s): TSH, T4TOTAL, T3FREE, THYROIDAB in the last 72  hours.  Invalid input(s): FREET3 ------------------------------------------------------------------------------------------------------------------ No results for input(s): VITAMINB12, FOLATE, FERRITIN, TIBC, IRON, RETICCTPCT in the last 72 hours.  Coagulation  profile No results for input(s): INR, PROTIME in the last 168 hours.  No results for input(s): DDIMER in the last 72 hours.  Cardiac Enzymes No results for input(s): CKMB, TROPONINI, MYOGLOBIN in the last 168 hours.  Invalid input(s): CK ------------------------------------------------------------------------------------------------------------------    Component Value Date/Time   BNP 2,007.2 (H) 03/25/2019 1313    Inpatient Medications  Scheduled Meds: . Darbepoetin Alfa      . sodium chloride   Intravenous Once  . sodium chloride   Intravenous Once  . allopurinol  100 mg Oral Daily  . amiodarone  200 mg Oral BID  . calcitRIOL  1 mcg Oral Q M,W,F-HD  . Chlorhexidine Gluconate Cloth  6 each Topical Q0600  . cholecalciferol  2,000 Units Oral Daily  . darbepoetin (ARANESP) injection - DIALYSIS  200 mcg Intravenous Q Sat-HD  . dicyclomine  10 mg Oral QHS  . docusate sodium  100 mg Oral BID  . feeding supplement (NEPRO CARB STEADY)  237 mL Oral BID BM  . ferric citrate  630 mg Oral TID WC  . isosorbide mononitrate  30 mg Oral QHS  . metoprolol tartrate  25 mg Oral BID  . mometasone-formoterol  2 puff Inhalation BID  . [START ON 07/04/2019] pantoprazole  40 mg Intravenous Q12H  . pravastatin  80 mg Oral QPM  . sodium chloride flush  3 mL Intravenous Q12H  . sucralfate  1 g Oral Q6H   Continuous Infusions: . sodium chloride    . sodium chloride    . ferric gluconate (FERRLECIT/NULECIT) IV 125 mg (07/01/19 1231)  . pantoprozole (PROTONIX) infusion 8 mg/hr (07/02/19 0000)   PRN Meds:.sodium chloride, sodium chloride, acetaminophen **OR** acetaminophen, alteplase, clobetasol cream, diphenhydrAMINE, heparin sodium  (porcine), ipratropium-albuterol, lidocaine (PF), lidocaine-prilocaine, lidocaine-prilocaine, nitroGLYCERIN, ondansetron, oxyCODONE-acetaminophen, pentafluoroprop-tetrafluoroeth, polyethylene glycol, senna-docusate  Micro Results Recent Results (from the past 240 hour(s))  SARS Coronavirus 2 by RT PCR (hospital order, performed in [place] hospital lab) Nasopharyngeal Nasopharyngeal Swab     Status: None   Collection Time: 06/29/19  5:01 PM   Specimen: Nasopharyngeal Swab  Result Value Ref Range Status   SARS Coronavirus 2 NEGATIVE NEGATIVE Final    Comment: (NOTE) SARS-CoV-2 target nucleic acids are NOT DETECTED. The SARS-CoV-2 RNA is generally detectable in upper and lower respiratory specimens during the acute phase of infection. The lowest concentration of SARS-CoV-2 viral copies this assay can detect is 250 copies / mL. A negative result does not preclude SARS-CoV-2 infection and should not be used as the sole basis for treatment or other patient management decisions.  A negative result may occur with improper specimen collection / handling, submission of specimen other than nasopharyngeal swab, presence of viral mutation(s) within the areas targeted by this assay, and inadequate number of viral copies (<250 copies / mL). A negative result must be combined with clinical observations, patient history, and epidemiological information. Fact Sheet for Patients:   StrictlyIdeas.no Fact Sheet for Healthcare Providers: BankingDealers.co.za This test is not yet approved or cleared  by the [place] FDA and has been authorized for detection and/or diagnosis of SARS-CoV-2 by FDA under an Emergency Use Authorization (EUA).  This EUA will remain in effect (meaning this test can be used) for the duration of the COVID-19 declaration under Section 564(b)(1) of the Act, 21 U.S.C. section 360bbb-3(b)(1), unless the authorization is terminated  or revoked sooner. Performed at [facility], [place] [address], [place], [place] [address]   MRSA PCR Screening  Status: None   Collection Time: 06/30/19  5:37 PM   Specimen: Nasopharyngeal  Result Value Ref Range Status   MRSA by PCR NEGATIVE NEGATIVE Final    Comment:        The GeneXpert MRSA Assay (FDA approved for NASAL specimens only), is one component of a comprehensive MRSA colonization surveillance program. It is not intended to diagnose MRSA infection nor to guide or monitor treatment for MRSA infections. Performed at [facility], [place] [address], [place], [place] [address]     Radiology Reports CT ANGIO HEAD W OR WO CONTRAST  Result Date: 06/19/2019 CLINICAL DATA:  69 year old female dialysis patient with encephalopathy, confusion after dialysis. Recent MRI demonstrating a small chronic left occipital cortical infarct, nearby chronic left occipital pole encephalomalacia, underlying advanced chronic ischemic disease. EXAM: CT ANGIOGRAPHY HEAD AND NECK TECHNIQUE: Multidetector CT imaging of the head and neck was performed using the standard protocol during bolus administration of intravenous contrast. Multiplanar CT image reconstructions and MIPs were obtained to evaluate the vascular anatomy. Carotid stenosis measurements (when applicable) are obtained utilizing NASCET criteria, using the distal internal carotid diameter as the denominator. CONTRAST:  99mL OMNIPAQUE IOHEXOL 350 MG/ML SOLN COMPARISON:  Brain MRI and head CT 06/17/2019. Neck CT without contrast 07/11/2016 FINDINGS: CT HEAD Brain: Subtle cortical hypodensity now on the left lateral occiput corresponding to the DWI lesion (series 5, image 14). No acute intracranial hemorrhage or mass effect. Otherwise Stable non contrast CT appearance of the brain. Calvarium and skull base: No acute osseous abnormality identified. Paranasal sinuses: Visualized paranasal sinuses and mastoids are stable and well  pneumatized. Orbits: Stable orbit and scalp soft tissues. CTA NECK Skeleton: Absent maxillary dentition. Advanced cervical spine degeneration with reversal of lordosis. Chronic deformity posterior right 1st and 2nd ribs. No acute osseous abnormality identified. Upper chest: Small layering pleural effusions are partially visible. Mild mosaic pulmonary attenuation. Visible precarinal and AP window lymph nodes are at the upper limits of normal. Right IJ approach dual lumen dialysis catheter. Other neck: No acute findings.  No cervical lymphadenopathy. Aortic arch: Severe atherosclerosis of the visible aorta including bulky mural plaque or thrombus throughout the arch (series 12, images 80 through 134). Three vessel arch configuration. Right carotid system: No brachiocephalic artery or right CCA origin stenosis despite plaque. Tortuous proximal right CCA. Intermittent plaque to the bifurcation without stenosis. Less than 50 % stenosis with respect to the distal vessel of the proximal right ICA despite plaque. Left carotid system: Prominent plaque at the left CCA origin and along the ventral vessel at the thoracic inlet but less than 50% stenosis (series 9, image 132). At the level of the thyroid combined soft and calcified plaque results in 60% stenosis (series 9, image 122). Capacious left carotid bifurcation with surgical clips compatible with previous endarterectomy. Tortuous left ICA distal to the bulb without stenosis to the skull base. Vertebral arteries: Proximal right subclavian soft and calcified plaque similar to the other great vessels. Moderate stenosis right vertebral artery origin best seen on series 12. Tortuous right V1 segment. No additional right vertebral stenosis to the skull base. Widespread proximal left subclavian artery plaque similar to the other great vessels without significant stenosis. Calcified plaque at the left vertebral artery origin, moderate stenosis best seen on series 11, image 262.  Tortuous left V1 segment. Codominant left vertebral artery otherwise patent to the skull base. CTA HEAD Posterior circulation: Bilateral V4 segment soft and calcified plaque. No significant stenosis on the left but  moderate to severe right V4 stenosis just distal to PICA on series 12, image 113. Normal left PICA. Patent vertebrobasilar junction. Patent basilar artery with only mild irregularity. Normal SCA and PCA origins. Posterior communicating arteries are diminutive or absent. The left PCA P3 superior division is occluded, although there is chronic encephalomalacia in the left PCA territory also. On the right there is mild to moderate P1 and P2 irregularity and stenosis. Anterior circulation: Both ICA siphons are patent. Heavy calcification through the left cavernous segment and anterior genu with mild to moderate stenosis. Similar right siphon calcified plaque with at least moderate stenosis at the anterior genu. Patent carotid termini. Normal MCA and ACA origins. Anterior communicating artery and bilateral ACA branches are within normal limits. Left MCA M1 segment and bifurcation are patent without stenosis. Right MCA M1 segment and bifurcation are patent without stenosis. Bilateral MCA branches are within normal limits. Venous sinuses: Early contrast timing but grossly patent. Anatomic variants: None. Review of the MIP images confirms the above findings IMPRESSION: 1. Negative for large vessel occlusion. 2. Positive for bulky Mural Soft Plaque versus Thrombus throughout the Aortic Arch. See series 12 images 80-134. Aortic Atherosclerosis (ICD10-I70.0). 3. Positive for occluded Left PCA superior P3 branch, although age indeterminate in light of some chronic left PCA territory encephalomalacia. 4. Positive also for significant atherosclerotic stenoses: - Left CCA 60% stenosis at the level of the thyroid. - moderate bilateral vertebral artery origin stenosis, and up to Severe Right Vertebral V4 segment stenosis  distal to PICA. - bilateral ICA siphon stenosis, up to moderate on the Left and Moderate To Severe at the Right ICA anterior genu. - moderate Right PCA P2 stenosis. 5.  Stable non contrast CT appearance of the brain. 6. Small bilateral pleural effusions. Advanced cervical spine degeneration. Electronically Signed   By: Genevie Ann M.D.   On: 06/19/2019 13:25   DG Chest 1 View  Result Date: 06/17/2019 CLINICAL DATA:  Hematemesis, history small bowel obstruction, hypertension, coronary artery disease post stenting, COPD, end-stage renal disease on hemodialysis, anemia secondary to GI bleed EXAM: CHEST  1 VIEW COMPARISON:  Portable exam 1506 hours compared to 06/14/2019 FINDINGS: RIGHT jugulars dialysis catheter with tip projecting over RIGHT atrium. Enlargement of cardiac silhouette with slight pulmonary vascular congestion. Atherosclerotic calcification aorta. Minimal perihilar to basilar pulmonary infiltrates consistent with pulmonary edema. No pleural effusion or pneumothorax. Bones demineralized. IMPRESSION: Enlargement of cardiac silhouette with pulmonary vascular congestion and minimal pulmonary edema. Electronically Signed   By: Lavonia Dana M.D.   On: 06/17/2019 15:26   CT HEAD WO CONTRAST  Result Date: 06/17/2019 CLINICAL DATA:  Delirium/confusion after dialysis EXAM: CT HEAD WITHOUT CONTRAST TECHNIQUE: Contiguous axial images were obtained from the base of the skull through the vertex without intravenous contrast. COMPARISON:  July 17, 2018 maxillofacial CT covering much of the brain parenchyma FINDINGS: Brain: There is mild diffuse atrophy. There is no intracranial mass, hemorrhage, extra-axial fluid collection, or midline shift. There is widespread small vessel disease throughout the centra semiovale bilaterally. There is a prior appearing infarct in the superior mid left frontal lobe with involvement of both gray and white matter in this area. There is a prior appearing infarct in the posterosuperior  left parietal lobe at the gray-white junction. There is evidence of a prior infarct in the medial right parietooccipital junction region. There is a prior infarct in the head of the caudate nucleus on the right. There is an age uncertain infarct in the left thalamus  which has somewhat indistinct borders suggesting that this infarct may be recent/acute. There are small lacunar type infarcts in each cerebellar hemisphere, more on the right than on the left. Vascular: No hyperdense vessel. There are foci of calcification in each carotid siphon and distal vertebral artery region. Skull: The bony calvarium a appears intact. Sinuses/Orbits: Paranasal sinuses are clear. There is rightward deviation of the nasal septum. Orbits appear symmetric bilaterally. Other: Mastoid air cells are clear. There is debris in each external auditory canal. Brain: It IMPRESSION: Mild atrophy. Extensive supratentorial small vessel disease. 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 And head of caudate nucleus on the right. Suspect more recent and potentially acute focal infarct in the left thalamus. Lacunar type infarcts noted in the cerebellar hemispheres bilaterally, more on the right than on the left. No mass or hemorrhage. Foci of arterial vascular calcification noted at multiple levels. Probable cerumen in each external auditory canal. Deviated nasal septum. Electronically Signed   By: Lowella Grip III M.D.   On: 06/17/2019 16:19   CT ANGIO NECK W OR WO CONTRAST  Result Date: 06/19/2019 CLINICAL DATA:  69 year old female dialysis patient with encephalopathy, confusion after dialysis. Recent MRI demonstrating a small chronic left occipital cortical infarct, nearby chronic left occipital pole encephalomalacia, underlying advanced chronic ischemic disease. EXAM: CT ANGIOGRAPHY HEAD AND NECK TECHNIQUE: Multidetector CT  imaging of the head and neck was performed using the standard protocol during bolus administration of intravenous contrast. Multiplanar CT image reconstructions and MIPs were obtained to evaluate the vascular anatomy. Carotid stenosis measurements (when applicable) are obtained utilizing NASCET criteria, using the distal internal carotid diameter as the denominator. CONTRAST:  1mL OMNIPAQUE IOHEXOL 350 MG/ML SOLN COMPARISON:  Brain MRI and head CT 06/17/2019. Neck CT without contrast 07/11/2016 FINDINGS: CT HEAD Brain: Subtle cortical hypodensity now on the left lateral occiput corresponding to the DWI lesion (series 5, image 14). No acute intracranial hemorrhage or mass effect. Otherwise Stable non contrast CT appearance of the brain. Calvarium and skull base: No acute osseous abnormality identified. Paranasal sinuses: Visualized paranasal sinuses and mastoids are stable and well pneumatized. Orbits: Stable orbit and scalp soft tissues. CTA NECK Skeleton: Absent maxillary dentition. Advanced cervical spine degeneration with reversal of lordosis. Chronic deformity posterior right 1st and 2nd ribs. No acute osseous abnormality identified. Upper chest: Small layering pleural effusions are partially visible. Mild mosaic pulmonary attenuation. Visible precarinal and AP window lymph nodes are at the upper limits of normal. Right IJ approach dual lumen dialysis catheter. Other neck: No acute findings.  No cervical lymphadenopathy. Aortic arch: Severe atherosclerosis of the visible aorta including bulky mural plaque or thrombus throughout the arch (series 12, images 80 through 134). Three vessel arch configuration. Right carotid system: No brachiocephalic artery or right CCA origin stenosis despite plaque. Tortuous proximal right CCA. Intermittent plaque to the bifurcation without stenosis. Less than 50 % stenosis with respect to the distal vessel of the proximal right ICA despite plaque. Left carotid system: Prominent  plaque at the left CCA origin and along the ventral vessel at the thoracic inlet but less than 50% stenosis (series 9, image 132). At the level of the thyroid combined soft and calcified plaque results in 60% stenosis (series 9, image 122). Capacious left carotid bifurcation with surgical clips compatible with previous endarterectomy. Tortuous left ICA distal to the bulb without stenosis to the skull base. Vertebral arteries: Proximal right subclavian soft  and calcified plaque similar to the other great vessels. Moderate stenosis right vertebral artery origin best seen on series 12. Tortuous right V1 segment. No additional right vertebral stenosis to the skull base. Widespread proximal left subclavian artery plaque similar to the other great vessels without significant stenosis. Calcified plaque at the left vertebral artery origin, moderate stenosis best seen on series 11, image 262. Tortuous left V1 segment. Codominant left vertebral artery otherwise patent to the skull base. CTA HEAD Posterior circulation: Bilateral V4 segment soft and calcified plaque. No significant stenosis on the left but moderate to severe right V4 stenosis just distal to PICA on series 12, image 113. Normal left PICA. Patent vertebrobasilar junction. Patent basilar artery with only mild irregularity. Normal SCA and PCA origins. Posterior communicating arteries are diminutive or absent. The left PCA P3 superior division is occluded, although there is chronic encephalomalacia in the left PCA territory also. On the right there is mild to moderate P1 and P2 irregularity and stenosis. Anterior circulation: Both ICA siphons are patent. Heavy calcification through the left cavernous segment and anterior genu with mild to moderate stenosis. Similar right siphon calcified plaque with at least moderate stenosis at the anterior genu. Patent carotid termini. Normal MCA and ACA origins. Anterior communicating artery and bilateral ACA branches are within  normal limits. Left MCA M1 segment and bifurcation are patent without stenosis. Right MCA M1 segment and bifurcation are patent without stenosis. Bilateral MCA branches are within normal limits. Venous sinuses: Early contrast timing but grossly patent. Anatomic variants: None. Review of the MIP images confirms the above findings IMPRESSION: 1. Negative for large vessel occlusion. 2. Positive for bulky Mural Soft Plaque versus Thrombus throughout the Aortic Arch. See series 12 images 80-134. Aortic Atherosclerosis (ICD10-I70.0). 3. Positive for occluded Left PCA superior P3 branch, although age indeterminate in light of some chronic left PCA territory encephalomalacia. 4. Positive also for significant atherosclerotic stenoses: - Left CCA 60% stenosis at the level of the thyroid. - moderate bilateral vertebral artery origin stenosis, and up to Severe Right Vertebral V4 segment stenosis distal to PICA. - bilateral ICA siphon stenosis, up to moderate on the Left and Moderate To Severe at the Right ICA anterior genu. - moderate Right PCA P2 stenosis. 5.  Stable non contrast CT appearance of the brain. 6. Small bilateral pleural effusions. Advanced cervical spine degeneration. Electronically Signed   By: Genevie Ann M.D.   On: 06/19/2019 13:25   MR BRAIN WO CONTRAST  Result Date: 06/17/2019 CLINICAL DATA:  69 year old female with encephalopathy. Confusion after dialysis. EXAM: MRI HEAD WITHOUT CONTRAST TECHNIQUE: Multiplanar, multiecho pulse sequences of the brain and surrounding structures were obtained without intravenous contrast. COMPARISON:  Head CT without contrast earlier today. FINDINGS: The examination had to be discontinued prior to completion by patient request. Diffusion-weighted imaging, axial T2 imaging, and moderately degraded sagittal T1 imaging only was obtained. Major intracranial vascular flow voids are preserved. There is a tiny focus of restricted diffusion in the cortex of the left lateral occipital  lobe. No other convincing restricted diffusion. See series 5, image 69. Nearby chronic cortical encephalomalacia in the left occipital pole. There are scattered small chronic infarcts in both cerebellar hemispheres, in the bilateral basal ganglia. There are chronic cortical infarcts in the bilateral MCA territories, and right posterior MCA/PCA watershed with cortical encephalomalacia. Superimposed confluent bilateral cerebral white matter T2 hyperintensity. No midline shift, mass effect, evidence of mass lesion, ventriculomegaly, extra-axial collection or acute intracranial hemorrhage is evident. Paranasal Visualized  paranasal sinuses and mastoids are stable and well pneumatized. Visible internal auditory structures appear normal. Mildly asymmetric size of the right superior ophthalmic vein, but otherwise negative orbits. Unremarkable noncontrast visible cavernous sinus. IMPRESSION: 1. Truncated exam by patient request. 2. Positive for a tiny acute cortical infarct in the left occipital lobe, located near a chronic left PCA infarct. 3. Underlying advanced chronic ischemic disease in the bilateral cerebellum and bilateral MCA territories. Electronically Signed   By: Genevie Ann M.D.   On: 06/17/2019 19:10   CT ABDOMEN PELVIS W CONTRAST  Result Date: 06/14/2019 CLINICAL DATA:  Vomiting and diarrhea beginning this morning. Abdominal pain and tenderness with palpation. EXAM: CT ABDOMEN AND PELVIS WITH CONTRAST TECHNIQUE: Multidetector CT imaging of the abdomen and pelvis was performed using the standard protocol following bolus administration of intravenous contrast. CONTRAST:  94mL OMNIPAQUE IOHEXOL 300 MG/ML  SOLN COMPARISON:  04/23/2019 FINDINGS: Lower Chest: New small bilateral pleural effusions and dependent bibasilar atelectasis. Stable cardiomegaly. Hepatobiliary: No hepatic masses identified. Gallbladder is unremarkable. No evidence of biliary ductal dilatation. Pancreas:  No mass or inflammatory changes.  Spleen: Within normal limits in size and appearance. Adrenals/Urinary Tract: Stable bilateral adrenal masses, consistent with benign adenomas. Diffuse bilateral renal atrophy and tiny bilateral renal cysts remain stable. No evidence of ureteral calculi or hydronephrosis. Unremarkable unopacified urinary bladder. Stomach/Bowel: Diffuse duodenal wall thickening and mucosal enhancement has increased since previous study, consistent with duodenitis. A small postbulbar duodenal ulcer is seen and decreased in size since previous study. No evidence of free intraperitoneal air. Vascular/Lymphatic: No pathologically enlarged lymph nodes. No abdominal aortic aneurysm. Aortic atherosclerosis noted. Reproductive: Prior hysterectomy noted. Adnexal regions are unremarkable in appearance. Other: Several small midline epigastric ventral hernias are again seen which contain only fat. No evidence of herniated bowel loops. Increased diffuse body wall edema since prior exam. Musculoskeletal:  No suspicious bone lesions identified. IMPRESSION: 1. Increased diffuse duodenal wall thickening and mucosal enhancement, consistent with duodenitis. 2. Decreased size of small postbulbar duodenal ulcer. 3. New small bilateral pleural effusions diffuse body wall edema. 4. Stable small epigastric ventral hernias containing only fat. 5. Stable benign bilateral adrenal adenomas. Aortic Atherosclerosis (ICD10-I70.0). Electronically Signed   By: Marlaine Hind M.D.   On: 06/14/2019 15:21   DG CHEST PORT 1 VIEW  Result Date: 06/30/2019 CLINICAL DATA:  GI bleed EXAM: PORTABLE CHEST 1 VIEW COMPARISON:  06/19/2019 FINDINGS: Right-sided hemodialysis catheter terminates at the level of the right atrium. Stable mild cardiomegaly. Atherosclerotic calcification of the aortic knob. Mild diffuse interstitial markings suggesting mild edema. No focal airspace consolidation. No pleural effusion or pneumothorax. IMPRESSION: Mildly prominent diffuse interstitial  markings suggesting mild edema. Electronically Signed   By: Davina Poke D.O.   On: 06/30/2019 16:14   DG CHEST PORT 1 VIEW  Result Date: 06/19/2019 CLINICAL DATA:  End-stage renal disease.  Dialysis. EXAM: PORTABLE CHEST 1 VIEW COMPARISON:  Jun 17, 2019 FINDINGS: The dialysis catheter terminates near the caval atrial junction. No pneumothorax. No overt edema. No nodules or masses. No focal infiltrates. There may be a small left effusion. The cardiomediastinal silhouette is stable. IMPRESSION: No overt edema. Stable dialysis catheter. There may be a small left effusion. Electronically Signed   By: Dorise Bullion III M.D   On: 06/19/2019 13:30   DG Chest Port 1 View  Result Date: 06/14/2019 CLINICAL DATA:  Weakness. Dialysis patient. EXAM: PORTABLE CHEST 1 VIEW COMPARISON:  Chest x-ray 05/22/2019 FINDINGS: The right IJ dialysis catheter is in good  position, unchanged. The cardiac silhouette, mediastinal and hilar contours are within normal limits. Mild tortuosity and calcification of the thoracic aorta. No acute pulmonary findings. No pleural effusions. No pulmonary lesions. IMPRESSION: No acute cardiopulmonary findings. Electronically Signed   By: Marijo Sanes M.D.   On: 06/14/2019 10:49   ECHOCARDIOGRAM COMPLETE  Result Date: 06/20/2019    ECHOCARDIOGRAM REPORT   Patient Name:   JACQUELI PANGALLO Date of Exam: 06/20/2019 Medical Rec #:  [id number]          Height:       62.0 in Accession #:    [id number]         Weight:       112.0 lb Date of Birth:  29-Dec-1950           BSA:          1.494 m Patient Age:    75 years           BP:           157/59 mmHg Patient Gender: F                  HR:           82 bpm. Exam Location:  Inpatient Procedure: 2D Echo, Color Doppler and Cardiac Doppler Indications:    Stroke 434.91/ I163.9  History:        Patient has prior history of Echocardiogram examinations, most                 recent 01/25/2019. CAD, COPD, Arrythmias:Atrial Fibrillation;                 Risk  Factors:Hypertension, Dyslipidemia and GERD. CKD.  Sonographer:    Jonelle Sidle Dance Referring Phys: [id number] ASHISH ARORA IMPRESSIONS  1. Left ventricular ejection fraction, by estimation, is 65 to 70%. The left ventricle has normal function. The left ventricle has no regional wall motion abnormalities. Left ventricular diastolic parameters are consistent with Grade II diastolic dysfunction (pseudonormalization).  2. Right ventricular systolic function is normal. The right ventricular size is normal. There is moderately elevated pulmonary artery systolic pressure. The estimated right ventricular systolic pressure is 96.2 mmHg.  3. Left atrial size was mild to moderately dilated.  4. Right atrial size was mildly dilated.  5. The mitral valve is normal in structure. Moderate mitral valve regurgitation. No evidence of mitral stenosis.  6. Tricuspid valve regurgitation is moderate.  7. The aortic valve is normal in structure. Aortic valve regurgitation is not visualized. No aortic stenosis is present. FINDINGS  Left Ventricle: Left ventricular ejection fraction, by estimation, is 65 to 70%. The left ventricle has normal function. The left ventricle has no regional wall motion abnormalities. The left ventricular internal cavity size was normal in size. There is  no left ventricular hypertrophy. Left ventricular diastolic parameters are consistent with Grade II diastolic dysfunction (pseudonormalization). Right Ventricle: The right ventricular size is normal. No increase in right ventricular wall thickness. Right ventricular systolic function is normal. There is moderately elevated pulmonary artery systolic pressure. The tricuspid regurgitant velocity is 3.35 m/s, and with an assumed right atrial pressure of 8 mmHg, the estimated right ventricular systolic pressure is 95.2 mmHg. Left Atrium: Left atrial size was mild to moderately dilated. Right Atrium: Right atrial size was mildly dilated. Pericardium: There is no evidence  of pericardial effusion. Mitral Valve: The mitral valve is normal in structure. Moderate mitral valve regurgitation. No evidence of mitral valve stenosis. Tricuspid Valve:  The tricuspid valve is grossly normal. Tricuspid valve regurgitation is moderate . No evidence of tricuspid stenosis. Aortic Valve: The aortic valve is normal in structure. Aortic valve regurgitation is not visualized. Aortic regurgitation PHT measures 378 msec. No aortic stenosis is present. Pulmonic Valve: The pulmonic valve was normal in structure. Pulmonic valve regurgitation is trivial. Aorta: The aortic root and ascending aorta are structurally normal, with no evidence of dilitation. IAS/Shunts: The atrial septum is grossly normal.  LEFT VENTRICLE PLAX 2D LVIDd:         4.00 cm  Diastology LVIDs:         2.36 cm  LV e' lateral:   6.90 cm/s LV PW:         0.96 cm  LV E/e' lateral: 21.7 LV IVS:        0.91 cm  LV e' medial:    5.10 cm/s LVOT diam:     1.70 cm  LV E/e' medial:  29.4 LV SV:         45 LV SV Index:   30 LVOT Area:     2.27 cm  RIGHT VENTRICLE             IVC RV Basal diam:  3.02 cm     IVC diam: 2.27 cm RV Mid diam:    1.83 cm RV S prime:     10.80 cm/s TAPSE (M-mode): 1.8 cm LEFT ATRIUM             Index       RIGHT ATRIUM           Index LA diam:        3.80 cm 2.54 cm/m  RA Area:     19.50 cm LA Vol (A2C):   74.0 ml 49.53 ml/m RA Volume:   54.70 ml  36.61 ml/m LA Vol (A4C):   54.7 ml 36.61 ml/m LA Biplane Vol: 64.4 ml 43.10 ml/m  AORTIC VALVE LVOT Vmax:   93.80 cm/s LVOT Vmean:  61.700 cm/s LVOT VTI:    0.199 m AI PHT:      378 msec  AORTA Ao Root diam: 3.00 cm Ao Asc diam:  2.70 cm MITRAL VALVE                 TRICUSPID VALVE MV Area (PHT): 2.76 cm      TR Peak grad:   44.9 mmHg MV Decel Time: 275 msec      TR Vmax:        335.00 cm/s MR Peak grad:    126.8 mmHg MR Mean grad:    86.0 mmHg   SHUNTS MR Vmax:         563.00 cm/s Systemic VTI:  0.20 m MR Vmean:        437.0 cm/s  Systemic Diam: 1.70 cm MR PISA:          0.57 cm MR PISA Eff ROA: 4 mm MR PISA Radius:  0.30 cm MV E velocity: 150.00 cm/s MV A velocity: 77.10 cm/s MV E/A ratio:  1.95 Mertie Moores MD Electronically signed by Mertie Moores MD Signature Date/Time: 06/20/2019/1:18:33 PM    Final      Phillips Climes M.D on 07/02/2019 at 2:19 PM    [facility] -  Office  [phone]

## 2019-07-02 NOTE — Progress Notes (Signed)
Verbal order from Dr. Waldron Labs 2 mg IV Haldol x1

## 2019-07-02 NOTE — Progress Notes (Signed)
Patient A&O x3-4, confused during afternoon. Patient trying to get out of bed and very agitated. IM Haldol given per Elgergawy,MD. VSS.  Free from falls. Bed alarm on. Pt turns self in bed.  Pt tolerating fluids and meals. Pain managed. Bed wheels locked. Phone and call bell within reach. Pt is resting, no distress.

## 2019-07-03 DIAGNOSIS — I739 Peripheral vascular disease, unspecified: Secondary | ICD-10-CM

## 2019-07-03 LAB — CBC
HCT: 30.2 % — ABNORMAL LOW (ref 36.0–46.0)
Hemoglobin: 9.7 g/dL — ABNORMAL LOW (ref 12.0–15.0)
MCH: 29.6 pg (ref 26.0–34.0)
MCHC: 32.1 g/dL (ref 30.0–36.0)
MCV: 92.1 fL (ref 80.0–100.0)
Platelets: 217 10*3/uL (ref 150–400)
RBC: 3.28 MIL/uL — ABNORMAL LOW (ref 3.87–5.11)
RDW: 15.4 % (ref 11.5–15.5)
WBC: 6.9 10*3/uL (ref 4.0–10.5)
nRBC: 0 % (ref 0.0–0.2)

## 2019-07-03 LAB — HEMOGLOBIN AND HEMATOCRIT, BLOOD
HCT: 30.7 % — ABNORMAL LOW (ref 36.0–46.0)
Hemoglobin: 9.8 g/dL — ABNORMAL LOW (ref 12.0–15.0)

## 2019-07-03 LAB — HEPATITIS B SURFACE ANTIBODY, QUANTITATIVE: Hep B S AB Quant (Post): 3.7 m[IU]/mL — ABNORMAL LOW (ref >9.9)

## 2019-07-03 NOTE — Plan of Care (Signed)
  Problem: Education: Goal: Knowledge of General Education information will improve Description Including pain rating scale, medication(s)/side effects and non-pharmacologic comfort measures Outcome: Progressing   

## 2019-07-03 NOTE — Progress Notes (Signed)
[facility] Progress Note   Dialysis Orders: [facility] TTS 4 hr EDW 48 kg is ?? F180 BFR 350 mL/ min DFR A1.5 UF profile 2no heparin Venofer 100 mg q rx x 9, started 5/27 Mircera 200 q 2 weeks, last given 06/02/19 Calcitriol 1.0 cmg q rx  Problem/Plan: 1.Recurrent GIB - melanoic stools on HD with abdominal pain -s/p 4 units PRBC seen by GI - presumed bleeding from previously diagnosed severe duodenal ulcerations - Plavix on hold- for repeat EGD this week- continue PPI bid - 2. ESRD-  TTS  AVF used  2 needles 6/5 Qb 250 no problems with cannulation.- plan extra treatment Monday to titrate volume down - work around EGD- continue to use AVF- next HD Tuesday - see below re: volume 3. Anemia- hgb 6.8 > 7.2>9 6/4 > 9.8 6/5  - behind on ESA dosing heme neg- Aranesp 200 given 6/5 -d  continue course of Fe - recent acute GIB in May with multiple deep duodenal ulcers - PPI indefinitely 4. Secondary hyperparathyroidism- resumed calcitriol 5.HTN/volumeHad excess volume on admit  titrating down -  Generalized edema - EDW was raised from 40.5 > 48 after last admission - post wt prior to last admission was 44 at the most -  need to tritrate EDW back down some what.- net UF 4 L 6/5 with post wt 50.3 -  6. Nutrition- alb very low  change to renal diet 7. Mesenteric ischemia s/p SMA stent 05/2019 8. PAD -ischemic left 4th and 5th toes Dr Early seeing- planning for arteriogram when able- work around EGD 9. Atrial fibrillation -no anticoag due to GIB - - on amiodarone, in NSR 10. Hx cocaine abuse - UDS negative  11. 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 DNR  13. Disp - tentative SNF   Myriam Jacobson, PA-C [place] [phone] 07/03/2019,9:08 AM  LOS: 3 days   Subjective:   Feet still painful.   Objective Vitals:   07/03/19 0513 07/03/19 0545 07/03/19 0756 07/03/19 0841  BP:    (!) 189/73  Pulse:  71  78  Resp:  11    Temp:    97.9 F (36.6 C)   TempSrc:   Oral   SpO2:  93%    Weight: 51.2 kg     Height:       Physical Exam General: NAD breathing easily Heart:  RRR  Lungs: dim bases  Abdomen: soft NT  Extremities:tr  LE edema - puffy feet warm Dialysis Access: left upper AVF + bruit   Additional Objective Labs: Basic Metabolic Panel: Recent Labs  Lab 06/30/19 0456 07/01/19 0617 07/02/19 0719  NA 137 136 133*  K 5.5* 4.3 5.2*  CL 108 101 99  CO2 18* 23 20*  GLUCOSE 99 85 110*  BUN 110* 52* 64*  CREATININE 7.25* 4.18* 5.19*  CALCIUM 7.7* 7.6* 8.0*  PHOS 8.3*  --  5.3*   Liver Function Tests: Recent Labs  Lab 06/29/19 1610 06/30/19 0456 07/02/19 0719  AST 15  --   --   ALT 11  --   --   ALKPHOS 43  --   --   BILITOT 0.7  --   --   PROT 4.9*  --   --   ALBUMIN 2.2* 2.0* 2.0*   No results for input(s): LIPASE, AMYLASE in the last 168 hours. CBC: Recent Labs  Lab 06/29/19 1610 06/29/19 1610 06/30/19 0457 06/30/19 0457 06/30/19 1311 06/30/19  1852 07/01/19 0617 07/01/19 1549 07/01/19 2358 07/02/19 0719 07/02/19 2335  WBC 6.3   < > 6.1   < > 9.8  --  6.3  --  7.3  --   --   NEUTROABS 4.7  --   --   --   --   --   --   --   --   --   --   HGB 6.8*   < > 7.2*   < > 9.8*   < > 7.9*   < > 9.0* 8.6* 9.8*  HCT 22.5*   < > 23.7*   < > 29.2*   < > 23.8*   < > 27.1* 26.4* 30.7*  MCV 94.1  --  93.3  --  88.8  --  89.8  --  89.7  --   --   PLT 261   < > 257   < > 166  --  177  --  178  --   --    < > = values in this interval not displayed.   Blood Culture    Component Value Date/Time   SDES BLOOD RIGHT HAND 06/17/2019 1223   [place]  06/17/2019 1223    BOTTLES DRAWN AEROBIC ONLY Blood Culture results may not be optimal due to an inadequate volume of blood received in culture bottles   CULT  06/17/2019 1223    NO GROWTH 5 DAYS Performed at [facility], [place] [address], [place], [place] [address]    REPTSTATUS 06/22/2019 FINAL 06/17/2019 1223    Cardiac Enzymes: No  results for input(s): CKTOTAL, CKMB, CKMBINDEX, TROPONINI in the last 168 hours. CBG: No results for input(s): GLUCAP in the last 168 hours. Iron Studies: No results for input(s): IRON, TIBC, TRANSFERRIN, FERRITIN in the last 72 hours. Lab Results  Component Value Date   INR 1.2 06/14/2019   INR 1.1 03/25/2019   INR 0.9 01/26/2019   Studies/Results: No results found. Medications: . sodium chloride    . sodium chloride    . ferric gluconate (FERRLECIT/NULECIT) IV 125 mg (07/01/19 1231)  . pantoprozole (PROTONIX) infusion 8 mg/hr (07/03/19 0550)   . sodium chloride   Intravenous Once  . sodium chloride   Intravenous Once  . allopurinol  100 mg Oral Daily  . amiodarone  200 mg Oral BID  . calcitRIOL  1 mcg Oral Q M,W,F-HD  . Chlorhexidine Gluconate Cloth  6 each Topical Q0600  . cholecalciferol  2,000 Units Oral Daily  . darbepoetin (ARANESP) injection - DIALYSIS  200 mcg Intravenous Q Sat-HD  . dicyclomine  10 mg Oral QHS  . docusate sodium  100 mg Oral BID  . feeding supplement (NEPRO CARB STEADY)  237 mL Oral BID BM  . ferric citrate  630 mg Oral TID WC  . haloperidol lactate  4 mg Intravenous Once  . isosorbide mononitrate  30 mg Oral QHS  . metoprolol tartrate  25 mg Oral BID  . mometasone-formoterol  2 puff Inhalation BID  . [START ON 07/04/2019] pantoprazole  40 mg Intravenous Q12H  . pravastatin  80 mg Oral QPM  . sodium chloride flush  3 mL Intravenous Q12H  . sucralfate  1 g Oral Q6H

## 2019-07-03 NOTE — Progress Notes (Signed)
Progress Note    07/03/2019 8:40 AM * No surgery found *  Subjective:  Cotntinues with c/o bilateral foot pain. She was helped to [facility] without lightheadedness, dizziness.  Small formed dark stool. Charcoal-colored smear on BSC seat.  She received additional unit of PRBCs on Friday.   Vitals:   07/03/19 0545 07/03/19 0756  BP:    Pulse: 71   Resp: 11   Temp:  97.9 F (36.6 C)  SpO2: 93%     Physical Exam: Cardiac:  RRR Lungs:  nonlabored Extremities:  Both feet are warm, tender to touch, AROM   CBC    Component Value Date/Time   WBC 7.3 07/01/2019 2358   RBC 3.02 (L) 07/01/2019 2358   HGB 9.8 (L) 07/02/2019 2335   HCT 30.7 (L) 07/02/2019 2335   PLT 178 07/01/2019 2358   MCV 89.7 07/01/2019 2358   MCH 29.8 07/01/2019 2358   MCHC 33.2 07/01/2019 2358   RDW 15.1 07/01/2019 2358   LYMPHSABS 0.9 06/29/2019 1610   MONOABS 0.5 06/29/2019 1610   EOSABS 0.1 06/29/2019 1610   BASOSABS 0.1 06/29/2019 1610    BMET    Component Value Date/Time   NA 133 (L) 07/02/2019 0719   K 5.2 (H) 07/02/2019 0719   CL 99 07/02/2019 0719   CO2 20 (L) 07/02/2019 0719   GLUCOSE 110 (H) 07/02/2019 0719   BUN 64 (H) 07/02/2019 0719   CREATININE 5.19 (H) 07/02/2019 0719   CALCIUM 8.0 (L) 07/02/2019 0719   GFRNONAA 8 (L) 07/02/2019 0719   GFRAA 9 (L) 07/02/2019 0719     Intake/Output Summary (Last 24 hours) at 07/03/2019 0840 Last data filed at 07/03/2019 0839 Gross per 24 hour  Intake 1234.76 ml  Output 4850 ml  Net -3615.24 ml    HOSPITAL MEDICATIONS Scheduled Meds: . sodium chloride   Intravenous Once  . sodium chloride   Intravenous Once  . allopurinol  100 mg Oral Daily  . amiodarone  200 mg Oral BID  . calcitRIOL  1 mcg Oral Q M,W,F-HD  . Chlorhexidine Gluconate Cloth  6 each Topical Q0600  . cholecalciferol  2,000 Units Oral Daily  . darbepoetin (ARANESP) injection - DIALYSIS  200 mcg Intravenous Q Sat-HD  . dicyclomine  10 mg Oral QHS  . docusate sodium  100 mg Oral  BID  . feeding supplement (NEPRO CARB STEADY)  237 mL Oral BID BM  . ferric citrate  630 mg Oral TID WC  . haloperidol lactate  4 mg Intravenous Once  . isosorbide mononitrate  30 mg Oral QHS  . metoprolol tartrate  25 mg Oral BID  . mometasone-formoterol  2 puff Inhalation BID  . [START ON 07/04/2019] pantoprazole  40 mg Intravenous Q12H  . pravastatin  80 mg Oral QPM  . sodium chloride flush  3 mL Intravenous Q12H  . sucralfate  1 g Oral Q6H   Continuous Infusions: . sodium chloride    . sodium chloride    . ferric gluconate (FERRLECIT/NULECIT) IV 125 mg (07/01/19 1231)  . pantoprozole (PROTONIX) infusion 8 mg/hr (07/03/19 0550)   PRN Meds:.sodium chloride, sodium chloride, acetaminophen **OR** acetaminophen, alteplase, clobetasol cream, diphenhydrAMINE, heparin sodium (porcine), ipratropium-albuterol, lidocaine (PF), lidocaine-prilocaine, lidocaine-prilocaine, nitroGLYCERIN, ondansetron, oxyCODONE-acetaminophen, pentafluoroprop-tetrafluoroeth, polyethylene glycol, senna-docusate   Assessment: PAD: Intermittent bilateral foot rest pain. Ischemic changes to toes without gangrene.  UGIB: Total of 4u PRBCs.Hgb 9.8 yesterday and was stable after her 4u PCs  Plan: -Possible EGD early next week.  Hgb appears to  be stabilizing. Arteriogram timing dependent upon endoscopy plans  Risa Grill, PA-C [facility] [phone] 07/03/2019  8:40 AM

## 2019-07-03 NOTE — Progress Notes (Signed)
PROGRESS NOTE                                                                                                                                                                                                             Patient Demographics:    Christina [name], is a 69 y.o. female, DOB - 1950-12-20, JOI:[id number]  Admit date - 06/29/2019   Admitting Physician Norval Morton, MD  Outpatient Primary MD for the patient is Nodal, [name], PA-C  LOS - 3   Chief Complaint  Patient presents with  . Rectal Problems       Brief Narrative    Christina [name] is a 69 y.o. female with medical history significant of HTN, HLD, CAD, A. fib, COPD, ESRD on HD(TTS), SBO, rectal prolapse, and polysubstance abuse including cocaine who initially presented with complaints of rectal pain progressively worsening over the last 4 days.  Patient missed her hemodialysis yesterday due to her pain.  Notes associated complaints of shortness of breath, leg swelling, epigastric abdominal and bilateral feet pain.  She is followed by Dr. Oneida Alar of vascular surgery appears to have been contemplating amputating her toes.  Patient admits that she has had several bloody bowel movements while she has been here in the hospital.  She adamantly denies any recent cocaine use.   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  Hospitalized 4/13-4/15 with acute respiratory failure secondary to volume overload and COPD exacerbation.  4/25-4/27 at [facility] for with A. fib and AVR no longer on anticoagulation due to large duodenal ulcer.  5/8-5/9 hospitalized for peripheral artery disease and gangrene of left foot for which she was discharged to follow-up with Dr. Oneida Alar.  Lastly hospitalized from 5/18-5/24  with GI bleed found to have several deep duodenal ulcers without active signs of bleeding.   ED Course: Upon admission into the emergency department patient was seen to be afebrile with pulse 52-151, blood pressures as low as 76/52 with improvement after 2 units of PRBCs during her hemodialysis.  Patient had initially been taken to hemodialysis, but returned to the emergency department after being noted to have several bowel movements with blood clots present.  Labs significant for hemoglobin 7.2-> 9.8 after 2 units of PRBCs, potassium 5.5, CO2 18, BUN 110, and creatinine 7.25.  [facility] have been formally consulted.     Subjective:    Christina [name] today she denies any complaints today, she had significant episodes of hospital delirium yesterday where she required IM Haldol .   Assessment  & Plan :    Active Problems:   GI bleed   GI bleed, acute blood loss anemia:  -Is most likely related to upper GI bleed, with known history of duodenal ulcer . -She was transfused 3 units PRBC, hemoglobin has responded appropriately, but it is trending down, she was transfused another unit yesterday, with good response, hemoglobin is 9 this morning . -Continue to hold aspirin and Plavix. -New with PPI, given she still having melena, and her hemoglobin is dropping, will continue with Protonix drip. -Diet has been advanced by GI. -Continue with close H&H monitoring. Has been stable, plan for endoscopy, earlier this week, timing per GI  Peripheral vascular disease with ischemic toes without gangrene -Neurosurgery input greatly appreciated, plan plan for arteriogram, after endoscopy is done -Holding aspirin and Plavix due to acute bleed  Transient hypotension:  -Resolved, secondary to acute blood loss anemia .  Hospital delirium -Patient she had significant episodes of hospital delirium which she required IM Haldol, mentation appears to be back to baseline this morning.  ESRD on HD:  -  renal consulted   Rectal prolapse: Acute on chronic -Lidocaine jelly as needed   Paroxysmal atrial fibrillation:  -  Not on anticoagulation due to history of GI bleeds. -Continue amiodarone and metoprolol  COPD, without acute exacerbation. -Continue pharmacy substitution of Dulera and duo nebs as needed for shortness of breath/wheezing  Polysubstance abuse:  -Urine drug screen is negative this admission  Hyperlipidemia -Continue pravastatin   COVID-19 Labs  No results for input(s): DDIMER, FERRITIN, LDH, CRP in the last 72 hours.  Lab Results  Component Value Date   SARSCOV2NAA NEGATIVE 06/29/2019   [place] NEGATIVE 06/14/2019   [place] NEGATIVE 05/10/2019   [place] NEGATIVE 04/23/2019     Code Status : DNR  Family Communication  : None at bedside  Disposition Plan  :  Status is: Inpatient  Remains inpatient appropriate because:Hemodynamically unstable   Dispo: The patient is from: Home              Anticipated d/c is to: SNF              Anticipated d/c date is: > 3 days              Patient currently is not medically stable to d/c.    Consults  :  Renal,GI,Vascular surgery  Procedures  : none  DVT Prophylaxis  :  SCD  Lab Results  Component Value Date   PLT 178 07/01/2019    Antibiotics  :    Anti-infectives (From admission, onward)   None        Objective:   Vitals:   07/03/19 0545 07/03/19 0756 07/03/19 0841 07/03/19 1200  BP:   (!) 189/73 (!) 174/70  Pulse: 71  78 65  Resp: 11   (!) 9  Temp:  97.9 F (36.6 C)  98 F (36.7 C)  TempSrc:  Oral  Oral  SpO2: 93%     Weight:      Height:        Wt Readings from Last 3 Encounters:  07/03/19 51.2 kg  06/20/19  50.8 kg  05/12/19 41.9 kg     Intake/Output Summary (Last 24 hours) at 07/03/2019 1233 Last data filed at 07/03/2019 0839 Gross per 24 hour  Intake 1234.76 ml  Output 850 ml  Net 384.76 ml     Physical Exam  Awake Alert, Oriented X 3, No new F.N  deficits, Normal affect Symmetrical Chest wall movement, Good air movement bilaterally, CTAB RRR,No Gallops,Rubs or new Murmurs, No Parasternal Heave +ve B.Sounds, Abd Soft, No tenderness, No rebound - guarding or rigidity. Patient with ischemic changes of her toes bilaterally, without cellulitis or drainage.      Data Review:    CBC Recent Labs  Lab 06/29/19 1610 06/29/19 1610 06/30/19 0457 06/30/19 0457 06/30/19 1311 06/30/19 1852 07/01/19 0617 07/01/19 1549 07/01/19 2358 07/02/19 0719 07/02/19 2335  WBC 6.3  --  6.1  --  9.8  --  6.3  --  7.3  --   --   HGB 6.8*   < > 7.2*   < > 9.8*   < > 7.9* 7.8* 9.0* 8.6* 9.8*  HCT 22.5*   < > 23.7*   < > 29.2*   < > 23.8* 23.4* 27.1* 26.4* 30.7*  PLT 261  --  257  --  166  --  177  --  178  --   --   MCV 94.1  --  93.3  --  88.8  --  89.8  --  89.7  --   --   MCH 28.5  --  28.3  --  29.8  --  29.8  --  29.8  --   --   MCHC 30.2  --  30.4  --  33.6  --  33.2  --  33.2  --   --   RDW 17.6*  --  16.6*  --  14.9  --  15.7*  --  15.1  --   --   LYMPHSABS 0.9  --   --   --   --   --   --   --   --   --   --   MONOABS 0.5  --   --   --   --   --   --   --   --   --   --   EOSABS 0.1  --   --   --   --   --   --   --   --   --   --   BASOSABS 0.1  --   --   --   --   --   --   --   --   --   --    < > = values in this interval not displayed.    Chemistries  Recent Labs  Lab 06/29/19 1610 06/30/19 0456 07/01/19 0617 07/02/19 0719  NA 138 137 136 133*  K 5.0 5.5* 4.3 5.2*  CL 103 108 101 99  CO2 17* 18* 23 20*  GLUCOSE 101* 99 85 110*  BUN 91* 110* 52* 64*  CREATININE 7.39* 7.25* 4.18* 5.19*  CALCIUM 8.1* 7.7* 7.6* 8.0*  AST 15  --   --   --   ALT 11  --   --   --   ALKPHOS 43  --   --   --   BILITOT 0.7  --   --   --    ------------------------------------------------------------------------------------------------------------------ No results for input(s): CHOL, HDL, LDLCALC, TRIG, CHOLHDL, LDLDIRECT  in the last 72  hours.  Lab Results  Component Value Date   HGBA1C 4.9 06/19/2019   ------------------------------------------------------------------------------------------------------------------ No results for input(s): TSH, T4TOTAL, T3FREE, THYROIDAB in the last 72 hours.  Invalid input(s): FREET3 ------------------------------------------------------------------------------------------------------------------ No results for input(s): VITAMINB12, FOLATE, FERRITIN, TIBC, IRON, RETICCTPCT in the last 72 hours.  Coagulation profile No results for input(s): INR, PROTIME in the last 168 hours.  No results for input(s): DDIMER in the last 72 hours.  Cardiac Enzymes No results for input(s): CKMB, TROPONINI, MYOGLOBIN in the last 168 hours.  Invalid input(s): CK ------------------------------------------------------------------------------------------------------------------    Component Value Date/Time   BNP 2,007.2 (H) 03/25/2019 1313    Inpatient Medications  Scheduled Meds: . sodium chloride   Intravenous Once  . sodium chloride   Intravenous Once  . allopurinol  100 mg Oral Daily  . amiodarone  200 mg Oral BID  . calcitRIOL  1 mcg Oral Q M,W,F-HD  . Chlorhexidine Gluconate Cloth  6 each Topical Q0600  . cholecalciferol  2,000 Units Oral Daily  . darbepoetin (ARANESP) injection - DIALYSIS  200 mcg Intravenous Q Sat-HD  . dicyclomine  10 mg Oral QHS  . docusate sodium  100 mg Oral BID  . feeding supplement (NEPRO CARB STEADY)  237 mL Oral BID BM  . ferric citrate  630 mg Oral TID WC  . haloperidol lactate  4 mg Intravenous Once  . isosorbide mononitrate  30 mg Oral QHS  . metoprolol tartrate  25 mg Oral BID  . mometasone-formoterol  2 puff Inhalation BID  . [START ON 07/04/2019] pantoprazole  40 mg Intravenous Q12H  . pravastatin  80 mg Oral QPM  . sodium chloride flush  3 mL Intravenous Q12H  . sucralfate  1 g Oral Q6H   Continuous Infusions: . ferric gluconate (FERRLECIT/NULECIT)  IV 125 mg (07/01/19 1231)  . pantoprozole (PROTONIX) infusion 8 mg/hr (07/03/19 0550)   PRN Meds:.acetaminophen **OR** acetaminophen, alteplase, clobetasol cream, diphenhydrAMINE, heparin sodium (porcine), ipratropium-albuterol, lidocaine (PF), lidocaine-prilocaine, lidocaine-prilocaine, nitroGLYCERIN, ondansetron, oxyCODONE-acetaminophen, pentafluoroprop-tetrafluoroeth, polyethylene glycol, senna-docusate  Micro Results Recent Results (from the past 240 hour(s))  SARS Coronavirus 2 by RT PCR (hospital order, performed in [facility] hospital lab) Nasopharyngeal Nasopharyngeal Swab     Status: None   Collection Time: 06/29/19  5:01 PM   Specimen: Nasopharyngeal Swab  Result Value Ref Range Status   SARS Coronavirus 2 NEGATIVE NEGATIVE Final    Comment: (NOTE) SARS-CoV-2 target nucleic acids are NOT DETECTED. The SARS-CoV-2 RNA is generally detectable in upper and lower respiratory specimens during the acute phase of infection. The lowest concentration of SARS-CoV-2 viral copies this assay can detect is 250 copies / mL. A negative result does not preclude SARS-CoV-2 infection and should not be used as the sole basis for treatment or other patient management decisions.  A negative result may occur with improper specimen collection / handling, submission of specimen other than nasopharyngeal swab, presence of viral mutation(s) within the areas targeted by this assay, and inadequate number of viral copies (<250 copies / mL). A negative result must be combined with clinical observations, patient history, and epidemiological information. Fact Sheet for Patients:   StrictlyIdeas.no Fact Sheet for Healthcare Providers: BankingDealers.co.za This test is not yet approved or cleared  by the [place] FDA and has been authorized for detection and/or diagnosis of SARS-CoV-2 by FDA under an Emergency Use Authorization (EUA).  This EUA will remain in  effect (meaning this test can be used) for the duration of the  COVID-19 declaration under Section 564(b)(1) of the Act, 21 U.S.C. section 360bbb-3(b)(1), unless the authorization is terminated or revoked sooner. Performed at [facility], [place] [address] [place], [place] [address]   MRSA PCR Screening     Status: None   Collection Time: 06/30/19  5:37 PM   Specimen: Nasopharyngeal  Result Value Ref Range Status   MRSA by PCR NEGATIVE NEGATIVE Final    Comment:        The GeneXpert MRSA Assay (FDA approved for NASAL specimens only), is one component of a comprehensive MRSA colonization surveillance program. It is not intended to diagnose MRSA infection nor to guide or monitor treatment for MRSA infections. Performed at [facility], [place] [address], [place], [place] [address]     Radiology Reports CT ANGIO HEAD W OR WO CONTRAST  Result Date: 06/19/2019 CLINICAL DATA:  69 year old female dialysis patient with encephalopathy, confusion after dialysis. Recent MRI demonstrating a small chronic left occipital cortical infarct, nearby chronic left occipital pole encephalomalacia, underlying advanced chronic ischemic disease. EXAM: CT ANGIOGRAPHY HEAD AND NECK TECHNIQUE: Multidetector CT imaging of the head and neck was performed using the standard protocol during bolus administration of intravenous contrast. Multiplanar CT image reconstructions and MIPs were obtained to evaluate the vascular anatomy. Carotid stenosis measurements (when applicable) are obtained utilizing NASCET criteria, using the distal internal carotid diameter as the denominator. CONTRAST:  32mL OMNIPAQUE IOHEXOL 350 MG/ML SOLN COMPARISON:  Brain MRI and head CT 06/17/2019. Neck CT without contrast 07/11/2016 FINDINGS: CT HEAD Brain: Subtle cortical hypodensity now on the left lateral occiput corresponding to the DWI lesion (series 5, image 14). No acute intracranial hemorrhage or mass effect. Otherwise Stable non  contrast CT appearance of the brain. Calvarium and skull base: No acute osseous abnormality identified. Paranasal sinuses: Visualized paranasal sinuses and mastoids are stable and well pneumatized. Orbits: Stable orbit and scalp soft tissues. CTA NECK Skeleton: Absent maxillary dentition. Advanced cervical spine degeneration with reversal of lordosis. Chronic deformity posterior right 1st and 2nd ribs. No acute osseous abnormality identified. Upper chest: Small layering pleural effusions are partially visible. Mild mosaic pulmonary attenuation. Visible precarinal and AP window lymph nodes are at the upper limits of normal. Right IJ approach dual lumen dialysis catheter. Other neck: No acute findings.  No cervical lymphadenopathy. Aortic arch: Severe atherosclerosis of the visible aorta including bulky mural plaque or thrombus throughout the arch (series 12, images 80 through 134). Three vessel arch configuration. Right carotid system: No brachiocephalic artery or right CCA origin stenosis despite plaque. Tortuous proximal right CCA. Intermittent plaque to the bifurcation without stenosis. Less than 50 % stenosis with respect to the distal vessel of the proximal right ICA despite plaque. Left carotid system: Prominent plaque at the left CCA origin and along the ventral vessel at the thoracic inlet but less than 50% stenosis (series 9, image 132). At the level of the thyroid combined soft and calcified plaque results in 60% stenosis (series 9, image 122). Capacious left carotid bifurcation with surgical clips compatible with previous endarterectomy. Tortuous left ICA distal to the bulb without stenosis to the skull base. Vertebral arteries: Proximal right subclavian soft and calcified plaque similar to the other great vessels. Moderate stenosis right vertebral artery origin best seen on series 12. Tortuous right V1 segment. No additional right vertebral stenosis to the skull base. Widespread proximal left subclavian  artery plaque similar to the other great vessels without significant stenosis. Calcified plaque at the left vertebral artery  origin, moderate stenosis best seen on series 11, image 262. Tortuous left V1 segment. Codominant left vertebral artery otherwise patent to the skull base. CTA HEAD Posterior circulation: Bilateral V4 segment soft and calcified plaque. No significant stenosis on the left but moderate to severe right V4 stenosis just distal to PICA on series 12, image 113. Normal left PICA. Patent vertebrobasilar junction. Patent basilar artery with only mild irregularity. Normal SCA and PCA origins. Posterior communicating arteries are diminutive or absent. The left PCA P3 superior division is occluded, although there is chronic encephalomalacia in the left PCA territory also. On the right there is mild to moderate P1 and P2 irregularity and stenosis. Anterior circulation: Both ICA siphons are patent. Heavy calcification through the left cavernous segment and anterior genu with mild to moderate stenosis. Similar right siphon calcified plaque with at least moderate stenosis at the anterior genu. Patent carotid termini. Normal MCA and ACA origins. Anterior communicating artery and bilateral ACA branches are within normal limits. Left MCA M1 segment and bifurcation are patent without stenosis. Right MCA M1 segment and bifurcation are patent without stenosis. Bilateral MCA branches are within normal limits. Venous sinuses: Early contrast timing but grossly patent. Anatomic variants: None. Review of the MIP images confirms the above findings IMPRESSION: 1. Negative for large vessel occlusion. 2. Positive for bulky Mural Soft Plaque versus Thrombus throughout the Aortic Arch. See series 12 images 80-134. Aortic Atherosclerosis (ICD10-I70.0). 3. Positive for occluded Left PCA superior P3 branch, although age indeterminate in light of some chronic left PCA territory encephalomalacia. 4. Positive also for significant  atherosclerotic stenoses: - Left CCA 60% stenosis at the level of the thyroid. - moderate bilateral vertebral artery origin stenosis, and up to Severe Right Vertebral V4 segment stenosis distal to PICA. - bilateral ICA siphon stenosis, up to moderate on the Left and Moderate To Severe at the Right ICA anterior genu. - moderate Right PCA P2 stenosis. 5.  Stable non contrast CT appearance of the brain. 6. Small bilateral pleural effusions. Advanced cervical spine degeneration. Electronically Signed   By: Genevie Ann M.D.   On: 06/19/2019 13:25   DG Chest 1 View  Result Date: 06/17/2019 CLINICAL DATA:  Hematemesis, history small bowel obstruction, hypertension, coronary artery disease post stenting, COPD, end-stage renal disease on hemodialysis, anemia secondary to GI bleed EXAM: CHEST  1 VIEW COMPARISON:  Portable exam 1506 hours compared to 06/14/2019 FINDINGS: RIGHT jugulars dialysis catheter with tip projecting over RIGHT atrium. Enlargement of cardiac silhouette with slight pulmonary vascular congestion. Atherosclerotic calcification aorta. Minimal perihilar to basilar pulmonary infiltrates consistent with pulmonary edema. No pleural effusion or pneumothorax. Bones demineralized. IMPRESSION: Enlargement of cardiac silhouette with pulmonary vascular congestion and minimal pulmonary edema. Electronically Signed   By: Lavonia Dana M.D.   On: 06/17/2019 15:26   CT HEAD WO CONTRAST  Result Date: 06/17/2019 CLINICAL DATA:  Delirium/confusion after dialysis EXAM: CT HEAD WITHOUT CONTRAST TECHNIQUE: Contiguous axial images were obtained from the base of the skull through the vertex without intravenous contrast. COMPARISON:  July 17, 2018 maxillofacial CT covering much of the brain parenchyma FINDINGS: Brain: There is mild diffuse atrophy. There is no intracranial mass, hemorrhage, extra-axial fluid collection, or midline shift. There is widespread small vessel disease throughout the centra semiovale bilaterally. There  is a prior appearing infarct in the superior mid left frontal lobe with involvement of both gray and white matter in this area. There is a prior appearing infarct in the posterosuperior left parietal lobe at  the gray-white junction. There is evidence of a prior infarct in the medial right parietooccipital junction region. There is a prior infarct in the head of the caudate nucleus on the right. There is an age uncertain infarct in the left thalamus which has somewhat indistinct borders suggesting that this infarct may be recent/acute. There are small lacunar type infarcts in each cerebellar hemisphere, more on the right than on the left. Vascular: No hyperdense vessel. There are foci of calcification in each carotid siphon and distal vertebral artery region. Skull: The bony calvarium a appears intact. Sinuses/Orbits: Paranasal sinuses are clear. There is rightward deviation of the nasal septum. Orbits appear symmetric bilaterally. Other: Mastoid air cells are clear. There is debris in each external auditory canal. Brain: It IMPRESSION: Mild atrophy. Extensive supratentorial small vessel disease. 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 And head of caudate nucleus on the right. Suspect more recent and potentially acute focal infarct in the left thalamus. Lacunar type infarcts noted in the cerebellar hemispheres bilaterally, more on the right than on the left. No mass or hemorrhage. Foci of arterial vascular calcification noted at multiple levels. Probable cerumen in each external auditory canal. Deviated nasal septum. Electronically Signed   By: Lowella Grip III M.D.   On: 06/17/2019 16:19   CT ANGIO NECK W OR WO CONTRAST  Result Date: 06/19/2019 CLINICAL DATA:  69 year old female dialysis patient with encephalopathy, confusion after dialysis. Recent MRI demonstrating a small chronic left occipital  cortical infarct, nearby chronic left occipital pole encephalomalacia, underlying advanced chronic ischemic disease. EXAM: CT ANGIOGRAPHY HEAD AND NECK TECHNIQUE: Multidetector CT imaging of the head and neck was performed using the standard protocol during bolus administration of intravenous contrast. Multiplanar CT image reconstructions and MIPs were obtained to evaluate the vascular anatomy. Carotid stenosis measurements (when applicable) are obtained utilizing NASCET criteria, using the distal internal carotid diameter as the denominator. CONTRAST:  42mL OMNIPAQUE IOHEXOL 350 MG/ML SOLN COMPARISON:  Brain MRI and head CT 06/17/2019. Neck CT without contrast 07/11/2016 FINDINGS: CT HEAD Brain: Subtle cortical hypodensity now on the left lateral occiput corresponding to the DWI lesion (series 5, image 14). No acute intracranial hemorrhage or mass effect. Otherwise Stable non contrast CT appearance of the brain. Calvarium and skull base: No acute osseous abnormality identified. Paranasal sinuses: Visualized paranasal sinuses and mastoids are stable and well pneumatized. Orbits: Stable orbit and scalp soft tissues. CTA NECK Skeleton: Absent maxillary dentition. Advanced cervical spine degeneration with reversal of lordosis. Chronic deformity posterior right 1st and 2nd ribs. No acute osseous abnormality identified. Upper chest: Small layering pleural effusions are partially visible. Mild mosaic pulmonary attenuation. Visible precarinal and AP window lymph nodes are at the upper limits of normal. Right IJ approach dual lumen dialysis catheter. Other neck: No acute findings.  No cervical lymphadenopathy. Aortic arch: Severe atherosclerosis of the visible aorta including bulky mural plaque or thrombus throughout the arch (series 12, images 80 through 134). Three vessel arch configuration. Right carotid system: No brachiocephalic artery or right CCA origin stenosis despite plaque. Tortuous proximal right CCA.  Intermittent plaque to the bifurcation without stenosis. Less than 50 % stenosis with respect to the distal vessel of the proximal right ICA despite plaque. Left carotid system: Prominent plaque at the left CCA origin and along the ventral vessel at the thoracic inlet but less than 50% stenosis (series 9, image 132). At the level of the thyroid combined  soft and calcified plaque results in 60% stenosis (series 9, image 122). Capacious left carotid bifurcation with surgical clips compatible with previous endarterectomy. Tortuous left ICA distal to the bulb without stenosis to the skull base. Vertebral arteries: Proximal right subclavian soft and calcified plaque similar to the other great vessels. Moderate stenosis right vertebral artery origin best seen on series 12. Tortuous right V1 segment. No additional right vertebral stenosis to the skull base. Widespread proximal left subclavian artery plaque similar to the other great vessels without significant stenosis. Calcified plaque at the left vertebral artery origin, moderate stenosis best seen on series 11, image 262. Tortuous left V1 segment. Codominant left vertebral artery otherwise patent to the skull base. CTA HEAD Posterior circulation: Bilateral V4 segment soft and calcified plaque. No significant stenosis on the left but moderate to severe right V4 stenosis just distal to PICA on series 12, image 113. Normal left PICA. Patent vertebrobasilar junction. Patent basilar artery with only mild irregularity. Normal SCA and PCA origins. Posterior communicating arteries are diminutive or absent. The left PCA P3 superior division is occluded, although there is chronic encephalomalacia in the left PCA territory also. On the right there is mild to moderate P1 and P2 irregularity and stenosis. Anterior circulation: Both ICA siphons are patent. Heavy calcification through the left cavernous segment and anterior genu with mild to moderate stenosis. Similar right siphon  calcified plaque with at least moderate stenosis at the anterior genu. Patent carotid termini. Normal MCA and ACA origins. Anterior communicating artery and bilateral ACA branches are within normal limits. Left MCA M1 segment and bifurcation are patent without stenosis. Right MCA M1 segment and bifurcation are patent without stenosis. Bilateral MCA branches are within normal limits. Venous sinuses: Early contrast timing but grossly patent. Anatomic variants: None. Review of the MIP images confirms the above findings IMPRESSION: 1. Negative for large vessel occlusion. 2. Positive for bulky Mural Soft Plaque versus Thrombus throughout the Aortic Arch. See series 12 images 80-134. Aortic Atherosclerosis (ICD10-I70.0). 3. Positive for occluded Left PCA superior P3 branch, although age indeterminate in light of some chronic left PCA territory encephalomalacia. 4. Positive also for significant atherosclerotic stenoses: - Left CCA 60% stenosis at the level of the thyroid. - moderate bilateral vertebral artery origin stenosis, and up to Severe Right Vertebral V4 segment stenosis distal to PICA. - bilateral ICA siphon stenosis, up to moderate on the Left and Moderate To Severe at the Right ICA anterior genu. - moderate Right PCA P2 stenosis. 5.  Stable non contrast CT appearance of the brain. 6. Small bilateral pleural effusions. Advanced cervical spine degeneration. Electronically Signed   By: Genevie Ann M.D.   On: 06/19/2019 13:25   MR BRAIN WO CONTRAST  Result Date: 06/17/2019 CLINICAL DATA:  69 year old female with encephalopathy. Confusion after dialysis. EXAM: MRI HEAD WITHOUT CONTRAST TECHNIQUE: Multiplanar, multiecho pulse sequences of the brain and surrounding structures were obtained without intravenous contrast. COMPARISON:  Head CT without contrast earlier today. FINDINGS: The examination had to be discontinued prior to completion by patient request. Diffusion-weighted imaging, axial T2 imaging, and moderately  degraded sagittal T1 imaging only was obtained. Major intracranial vascular flow voids are preserved. There is a tiny focus of restricted diffusion in the cortex of the left lateral occipital lobe. No other convincing restricted diffusion. See series 5, image 69. Nearby chronic cortical encephalomalacia in the left occipital pole. There are scattered small chronic infarcts in both cerebellar hemispheres, in the bilateral basal ganglia. There are chronic cortical  infarcts in the bilateral MCA territories, and right posterior MCA/PCA watershed with cortical encephalomalacia. Superimposed confluent bilateral cerebral white matter T2 hyperintensity. No midline shift, mass effect, evidence of mass lesion, ventriculomegaly, extra-axial collection or acute intracranial hemorrhage is evident. Paranasal Visualized paranasal sinuses and mastoids are stable and well pneumatized. Visible internal auditory structures appear normal. Mildly asymmetric size of the right superior ophthalmic vein, but otherwise negative orbits. Unremarkable noncontrast visible cavernous sinus. IMPRESSION: 1. Truncated exam by patient request. 2. Positive for a tiny acute cortical infarct in the left occipital lobe, located near a chronic left PCA infarct. 3. Underlying advanced chronic ischemic disease in the bilateral cerebellum and bilateral MCA territories. Electronically Signed   By: Genevie Ann M.D.   On: 06/17/2019 19:10   CT ABDOMEN PELVIS W CONTRAST  Result Date: 06/14/2019 CLINICAL DATA:  Vomiting and diarrhea beginning this morning. Abdominal pain and tenderness with palpation. EXAM: CT ABDOMEN AND PELVIS WITH CONTRAST TECHNIQUE: Multidetector CT imaging of the abdomen and pelvis was performed using the standard protocol following bolus administration of intravenous contrast. CONTRAST:  33mL OMNIPAQUE IOHEXOL 300 MG/ML  SOLN COMPARISON:  04/23/2019 FINDINGS: Lower Chest: New small bilateral pleural effusions and dependent bibasilar  atelectasis. Stable cardiomegaly. Hepatobiliary: No hepatic masses identified. Gallbladder is unremarkable. No evidence of biliary ductal dilatation. Pancreas:  No mass or inflammatory changes. Spleen: Within normal limits in size and appearance. Adrenals/Urinary Tract: Stable bilateral adrenal masses, consistent with benign adenomas. Diffuse bilateral renal atrophy and tiny bilateral renal cysts remain stable. No evidence of ureteral calculi or hydronephrosis. Unremarkable unopacified urinary bladder. Stomach/Bowel: Diffuse duodenal wall thickening and mucosal enhancement has increased since previous study, consistent with duodenitis. A small postbulbar duodenal ulcer is seen and decreased in size since previous study. No evidence of free intraperitoneal air. Vascular/Lymphatic: No pathologically enlarged lymph nodes. No abdominal aortic aneurysm. Aortic atherosclerosis noted. Reproductive: Prior hysterectomy noted. Adnexal regions are unremarkable in appearance. Other: Several small midline epigastric ventral hernias are again seen which contain only fat. No evidence of herniated bowel loops. Increased diffuse body wall edema since prior exam. Musculoskeletal:  No suspicious bone lesions identified. IMPRESSION: 1. Increased diffuse duodenal wall thickening and mucosal enhancement, consistent with duodenitis. 2. Decreased size of small postbulbar duodenal ulcer. 3. New small bilateral pleural effusions diffuse body wall edema. 4. Stable small epigastric ventral hernias containing only fat. 5. Stable benign bilateral adrenal adenomas. Aortic Atherosclerosis (ICD10-I70.0). Electronically Signed   By: Marlaine Hind M.D.   On: 06/14/2019 15:21   DG CHEST PORT 1 VIEW  Result Date: 06/30/2019 CLINICAL DATA:  GI bleed EXAM: PORTABLE CHEST 1 VIEW COMPARISON:  06/19/2019 FINDINGS: Right-sided hemodialysis catheter terminates at the level of the right atrium. Stable mild cardiomegaly. Atherosclerotic calcification of the  aortic knob. Mild diffuse interstitial markings suggesting mild edema. No focal airspace consolidation. No pleural effusion or pneumothorax. IMPRESSION: Mildly prominent diffuse interstitial markings suggesting mild edema. Electronically Signed   By: Davina Poke D.O.   On: 06/30/2019 16:14   DG CHEST PORT 1 VIEW  Result Date: 06/19/2019 CLINICAL DATA:  End-stage renal disease.  Dialysis. EXAM: PORTABLE CHEST 1 VIEW COMPARISON:  Jun 17, 2019 FINDINGS: The dialysis catheter terminates near the caval atrial junction. No pneumothorax. No overt edema. No nodules or masses. No focal infiltrates. There may be a small left effusion. The cardiomediastinal silhouette is stable. IMPRESSION: No overt edema. Stable dialysis catheter. There may be a small left effusion. Electronically Signed   By: Dorise Bullion III  M.D   On: 06/19/2019 13:30   DG Chest Port 1 View  Result Date: 06/14/2019 CLINICAL DATA:  Weakness. Dialysis patient. EXAM: PORTABLE CHEST 1 VIEW COMPARISON:  Chest x-ray 05/22/2019 FINDINGS: The right IJ dialysis catheter is in good position, unchanged. The cardiac silhouette, mediastinal and hilar contours are within normal limits. Mild tortuosity and calcification of the thoracic aorta. No acute pulmonary findings. No pleural effusions. No pulmonary lesions. IMPRESSION: No acute cardiopulmonary findings. Electronically Signed   By: Marijo Sanes M.D.   On: 06/14/2019 10:49   ECHOCARDIOGRAM COMPLETE  Result Date: 06/20/2019    ECHOCARDIOGRAM REPORT   Patient Name:   KEELIE ZEMANEK Date of Exam: 06/20/2019 Medical Rec #:  [id number]          Height:       62.0 in Accession #:    [id number]         Weight:       112.0 lb Date of Birth:  08-14-1950           BSA:          1.494 m Patient Age:    55 years           BP:           157/59 mmHg Patient Gender: F                  HR:           82 bpm. Exam Location:  Inpatient Procedure: 2D Echo, Color Doppler and Cardiac Doppler Indications:    Stroke  434.91/ I163.9  History:        Patient has prior history of Echocardiogram examinations, most                 recent 01/25/2019. CAD, COPD, Arrythmias:Atrial Fibrillation;                 Risk Factors:Hypertension, Dyslipidemia and GERD. CKD.  Sonographer:    Jonelle Sidle Dance Referring Phys: [id number] ASHISH ARORA IMPRESSIONS  1. Left ventricular ejection fraction, by estimation, is 65 to 70%. The left ventricle has normal function. The left ventricle has no regional wall motion abnormalities. Left ventricular diastolic parameters are consistent with Grade II diastolic dysfunction (pseudonormalization).  2. Right ventricular systolic function is normal. The right ventricular size is normal. There is moderately elevated pulmonary artery systolic pressure. The estimated right ventricular systolic pressure is 30.8 mmHg.  3. Left atrial size was mild to moderately dilated.  4. Right atrial size was mildly dilated.  5. The mitral valve is normal in structure. Moderate mitral valve regurgitation. No evidence of mitral stenosis.  6. Tricuspid valve regurgitation is moderate.  7. The aortic valve is normal in structure. Aortic valve regurgitation is not visualized. No aortic stenosis is present. FINDINGS  Left Ventricle: Left ventricular ejection fraction, by estimation, is 65 to 70%. The left ventricle has normal function. The left ventricle has no regional wall motion abnormalities. The left ventricular internal cavity size was normal in size. There is  no left ventricular hypertrophy. Left ventricular diastolic parameters are consistent with Grade II diastolic dysfunction (pseudonormalization). Right Ventricle: The right ventricular size is normal. No increase in right ventricular wall thickness. Right ventricular systolic function is normal. There is moderately elevated pulmonary artery systolic pressure. The tricuspid regurgitant velocity is 3.35 m/s, and with an assumed right atrial pressure of 8 mmHg, the estimated right  ventricular systolic pressure is 65.7 mmHg. Left Atrium: Left atrial size  was mild to moderately dilated. Right Atrium: Right atrial size was mildly dilated. Pericardium: There is no evidence of pericardial effusion. Mitral Valve: The mitral valve is normal in structure. Moderate mitral valve regurgitation. No evidence of mitral valve stenosis. Tricuspid Valve: The tricuspid valve is grossly normal. Tricuspid valve regurgitation is moderate . No evidence of tricuspid stenosis. Aortic Valve: The aortic valve is normal in structure. Aortic valve regurgitation is not visualized. Aortic regurgitation PHT measures 378 msec. No aortic stenosis is present. Pulmonic Valve: The pulmonic valve was normal in structure. Pulmonic valve regurgitation is trivial. Aorta: The aortic root and ascending aorta are structurally normal, with no evidence of dilitation. IAS/Shunts: The atrial septum is grossly normal.  LEFT VENTRICLE PLAX 2D LVIDd:         4.00 cm  Diastology LVIDs:         2.36 cm  LV e' lateral:   6.90 cm/s LV PW:         0.96 cm  LV E/e' lateral: 21.7 LV IVS:        0.91 cm  LV e' medial:    5.10 cm/s LVOT diam:     1.70 cm  LV E/e' medial:  29.4 LV SV:         45 LV SV Index:   30 LVOT Area:     2.27 cm  RIGHT VENTRICLE             IVC RV Basal diam:  3.02 cm     IVC diam: 2.27 cm RV Mid diam:    1.83 cm RV S prime:     10.80 cm/s TAPSE (M-mode): 1.8 cm LEFT ATRIUM             Index       RIGHT ATRIUM           Index LA diam:        3.80 cm 2.54 cm/m  RA Area:     19.50 cm LA Vol (A2C):   74.0 ml 49.53 ml/m RA Volume:   54.70 ml  36.61 ml/m LA Vol (A4C):   54.7 ml 36.61 ml/m LA Biplane Vol: 64.4 ml 43.10 ml/m  AORTIC VALVE LVOT Vmax:   93.80 cm/s LVOT Vmean:  61.700 cm/s LVOT VTI:    0.199 m AI PHT:      378 msec  AORTA Ao Root diam: 3.00 cm Ao Asc diam:  2.70 cm MITRAL VALVE                 TRICUSPID VALVE MV Area (PHT): 2.76 cm      TR Peak grad:   44.9 mmHg MV Decel Time: 275 msec      TR Vmax:        335.00  cm/s MR Peak grad:    126.8 mmHg MR Mean grad:    86.0 mmHg   SHUNTS MR Vmax:         563.00 cm/s Systemic VTI:  0.20 m MR Vmean:        437.0 cm/s  Systemic Diam: 1.70 cm MR PISA:         0.57 cm MR PISA Eff ROA: 4 mm MR PISA Radius:  0.30 cm MV E velocity: 150.00 cm/s MV A velocity: 77.10 cm/s MV E/A ratio:  1.95 Mertie Moores MD Electronically signed by Mertie Moores MD Signature Date/Time: 06/20/2019/1:18:33 PM    Final      Phillips Climes M.D on 07/03/2019 at 12:33 PM  [facility] -  Office  [phone]

## 2019-07-04 DIAGNOSIS — N186 End stage renal disease: Secondary | ICD-10-CM

## 2019-07-04 DIAGNOSIS — D649 Anemia, unspecified: Secondary | ICD-10-CM

## 2019-07-04 DIAGNOSIS — Z992 Dependence on renal dialysis: Secondary | ICD-10-CM

## 2019-07-04 DIAGNOSIS — Z8719 Personal history of other diseases of the digestive system: Secondary | ICD-10-CM

## 2019-07-04 LAB — BASIC METABOLIC PANEL
Anion gap: 10 (ref 5–15)
BUN: 44 mg/dL — ABNORMAL HIGH (ref 8–23)
CO2: 24 mmol/L (ref 22–32)
Calcium: 8.6 mg/dL — ABNORMAL LOW (ref 8.9–10.3)
Chloride: 102 mmol/L (ref 98–111)
Creatinine, Ser: 5.05 mg/dL — ABNORMAL HIGH (ref 0.44–1.00)
GFR calc Af Amer: 9 mL/min — ABNORMAL LOW (ref >60)
GFR calc non Af Amer: 8 mL/min — ABNORMAL LOW (ref >60)
Glucose, Bld: 125 mg/dL — ABNORMAL HIGH (ref 70–99)
Potassium: 4 mmol/L (ref 3.5–5.1)
Sodium: 136 mmol/L (ref 135–145)

## 2019-07-04 LAB — CBC
HCT: 32.2 % — ABNORMAL LOW (ref 36.0–46.0)
Hemoglobin: 10.2 g/dL — ABNORMAL LOW (ref 12.0–15.0)
MCH: 29.9 pg (ref 26.0–34.0)
MCHC: 31.7 g/dL (ref 30.0–36.0)
MCV: 94.4 fL (ref 80.0–100.0)
Platelets: 281 10*3/uL (ref 150–400)
RBC: 3.41 MIL/uL — ABNORMAL LOW (ref 3.87–5.11)
RDW: 15.9 % — ABNORMAL HIGH (ref 11.5–15.5)
WBC: 8 10*3/uL (ref 4.0–10.5)
nRBC: 0 % (ref 0.0–0.2)

## 2019-07-04 MED ORDER — HYDRALAZINE HCL 20 MG/ML IJ SOLN
10.0000 mg | Freq: Once | INTRAMUSCULAR | Status: AC
  Administered 2019-07-04: 10 mg via INTRAVENOUS
  Filled 2019-07-04: qty 1

## 2019-07-04 MED ORDER — LORAZEPAM 2 MG/ML IJ SOLN: 1.0000 mg | Freq: Once | INTRAMUSCULAR | Status: DC

## 2019-07-04 MED ORDER — HYDRALAZINE HCL 25 MG PO TABS
25.0000 mg | ORAL_TABLET | Freq: Four times a day (QID) | ORAL | Status: AC | PRN
  Administered 2019-07-04 – 2019-07-06 (×2): 25 mg via ORAL
  Filled 2019-07-04 (×2): qty 1

## 2019-07-04 MED ORDER — CLONIDINE HCL 0.2 MG PO TABS
0.2000 mg | ORAL_TABLET | Freq: Once | ORAL | Status: AC
  Administered 2019-07-04: 0.2 mg via ORAL
  Filled 2019-07-04: qty 1

## 2019-07-04 MED ORDER — HYDRALAZINE HCL 20 MG/ML IJ SOLN
5.0000 mg | Freq: Four times a day (QID) | INTRAMUSCULAR | Status: DC | PRN
  Administered 2019-07-05 – 2019-07-12 (×4): 5 mg via INTRAVENOUS
  Filled 2019-07-04 (×4): qty 1

## 2019-07-04 NOTE — Progress Notes (Signed)
   07/04/19 0056  Assess: MEWS Score  BP (!) 199/79  Pulse Rate 65  ECG Heart Rate 65  Resp 10  SpO2 95 %  O2 Device Room Air  Patient Activity (if Appropriate) In bed  Assess: MEWS Score  MEWS Temp 0  MEWS Systolic 0  MEWS Pulse 0  MEWS RR 1  MEWS LOC 0  MEWS Score 1  MEWS Score Color Green  Notify: Provider  Provider Wardell Honour, NP  Date Provider Notified 07/04/19  Time Provider Notified 0102  Notification Type Page  Notification Reason Other (Comment) (Continue hypertension while in bed,even after HS Metoprolol.)  Response See new orders  Date of Provider Response 07/04/19  Time of Provider Response 0109  Document  Patient Outcome Not stable and remains on department   BP elevated at HS. Metoprolol 25 mg given PO as scheduled. BP remains high while patient resting in bed. Patient asymptomatic. Blount, NP notified via text page. Hydralazine given IV per one time order. Will continue to monitor.

## 2019-07-04 NOTE — Progress Notes (Signed)
Upon assessment of the patients arms, there is no acceptable place for a PIV. This patient is restricted on the left arm due to dialysis, and the right arm is edematous due to infiltration, she has several skin tears, and the upper arm is the only available area to place a PIV. Using the ultrasound I was unable to find the cephalic vein which would be the only proper vein in the upper arm to place a PIV at this time. MD Elgergaway along with the patients nurse were notified via secure chat.

## 2019-07-04 NOTE — Progress Notes (Signed)
PROGRESS NOTE                                                                                                                                                                                                             Patient Demographics:    Christina [name], is a 69 y.o. female, DOB - Apr 17, 1950, TKW:[id number]  Admit date - 06/29/2019   Admitting Physician Christina Morton, MD  Outpatient Primary MD for the patient is Nodal, Christina Dublin, PA-C  LOS - 4   Chief Complaint  Patient presents with  . Rectal Problems       Brief Narrative    Christina [name] is a 69 y.o. female with medical history significant of HTN, HLD, CAD, A. fib, COPD, ESRD on HD(TTS), SBO, rectal prolapse, and polysubstance abuse including cocaine who initially presented with complaints of rectal pain progressively worsening over the last 4 days.    Patient missed her dialysis due to pain, She is followed by Dr. Oneida [name] of vascular surgery appears to have been contemplating amputating her toes.  Patient admits that she has had several bloody bowel movements while she has been here in the hospital.  She adamantly denies any recent cocaine use.  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  Hospitalized 4/13-4/15 with acute respiratory failure secondary to volume overload and COPD exacerbation.  4/25-4/27 at [place] for with A. fib and AVR no longer on anticoagulation due to large duodenal ulcer.  5/8-5/9 hospitalized for peripheral artery disease and gangrene of left foot for which she was discharged to follow-up with Dr. Oneida [name].  Lastly hospitalized from 5/18-5/24 with GI bleed found to have several deep duodenal ulcers without active signs of bleeding. Patient was noted to be hypotensive on  admission 76/52, hemoglobin which has responded to PRBC transfusion, but she kept to having continuous melena during hospital stay, where she was seen by GI, treated with tonics, her hemoglobin has stabilized after 2 units PRBC, plan for endoscopy per GI, and arteriogram by vascular surgery.    Subjective:    Christina [name] today she denies any complaints today, denies any dizziness, lightheadedness, shortness of breath or chest pain .  Assessment  & Plan :    Active Problems:   ESRD (end stage renal disease) on dialysis (HCC)   GI bleed   History of duodenal ulcer   Symptomatic anemia   GI bleed, acute blood loss anemia:  -Is most likely related to upper GI bleed, with known history of duodenal ulcer . -She was transfused 3 units PRBC, hemoglobin has responded appropriately, but it is trending down, she was transfused another unit yesterday, with good response, hemoglobin is 9.7  this morning . -Continue to hold aspirin and Plavix. -Continue with PPI -Diet has been advanced by GI. -Continue with close H&H monitoring. Has been stable, plan for endoscopy tomorrow by GI  Peripheral vascular disease with ischemic toes without gangrene -Neurosurgery input greatly appreciated, plan plan for arteriogram, after endoscopy is done -Holding aspirin and Plavix due to acute bleed  Transient hypotension:  -Resolved, secondary to acute blood loss anemia .  Hospital delirium -Patient she had significant episodes of hospital delirium which she required IM Haldol, mentation appears to be back to baseline this morning.  ESRD on HD:  - renal consulted   Rectal prolapse: Acute on chronic -Lidocaine jelly as needed   Paroxysmal atrial fibrillation:  -  Not on anticoagulation due to history of GI bleeds. -Continue amiodarone and metoprolol  COPD, without acute exacerbation. -Continue pharmacy substitution of Dulera and duo nebs as needed for shortness of  breath/wheezing  Polysubstance abuse:  -Urine drug screen is negative this admission  Hyperlipidemia -Continue pravastatin   COVID-19 Labs  No results for input(s): DDIMER, FERRITIN, LDH, CRP in the last 72 hours.  Lab Results  Component Value Date   SARSCOV2NAA NEGATIVE 06/29/2019   [place] NEGATIVE 06/14/2019   [place] NEGATIVE 05/10/2019   [place] NEGATIVE 04/23/2019     Code Status : DNR  Family Communication  : None at bedside  Disposition Plan  :  Status is: Inpatient  Remains inpatient appropriate because:Hemodynamically unstable   Dispo: The patient is from: Home              Anticipated d/c is to: SNF              Anticipated d/c date is: > 3 days              Patient currently is not medically stable to d/c.    Consults  :  Renal,GI,Vascular surgery  Procedures  : none  DVT Prophylaxis  :  SCD  Lab Results  Component Value Date   PLT 281 07/04/2019    Antibiotics  :    Anti-infectives (From admission, onward)   None        Objective:   Vitals:   07/04/19 0804 07/04/19 0824 07/04/19 0926 07/04/19 1149  BP: (!) 173/70  (!) 188/65 (!) 155/56  Pulse: 64  67 (!) 59  Resp: 12   17  Temp:   97.6 F (36.4 C) 98 F (36.7 C)  TempSrc:   Oral   SpO2: 98% 98% 98% 97%  Weight:      Height:        Wt Readings from Last 3 Encounters:  07/04/19 49 kg  06/20/19 50.8 kg  05/12/19 41.9 kg     Intake/Output Summary (Last 24 hours) at 07/04/2019 1406 Last data filed at 07/04/2019 0805 Gross per 24 hour  Intake 893.55 ml  Output 1475 ml  Net -581.45 ml     Physical Exam  Awake Alert, Oriented X 3, No new F.N  deficits, Normal affect Symmetrical Chest wall movement, Good air movement bilaterally, CTAB RRR,No Gallops,Rubs or new Murmurs, No Parasternal Heave +ve B.Sounds, Abd Soft, No tenderness, No rebound - guarding or rigidity. Patient with ischemic changes of her toes bilaterally, without cellulitis or drainage.       Data Review:    CBC Recent Labs  Lab 06/29/19 1610 06/30/19 0457 06/30/19 1311 06/30/19 1852 07/01/19 0617 07/01/19 1549 07/01/19 2358 07/02/19 0719 07/02/19 2335 07/03/19 1145 07/04/19 1023  WBC 6.3   < > 9.8  --  6.3  --  7.3  --   --  6.9 8.0  HGB 6.8*   < > 9.8*   < > 7.9*   < > 9.0* 8.6* 9.8* 9.7* 10.2*  HCT 22.5*   < > 29.2*   < > 23.8*   < > 27.1* 26.4* 30.7* 30.2* 32.2*  PLT 261   < > 166  --  177  --  178  --   --  217 281  MCV 94.1   < > 88.8  --  89.8  --  89.7  --   --  92.1 94.4  MCH 28.5   < > 29.8  --  29.8  --  29.8  --   --  29.6 29.9  MCHC 30.2   < > 33.6  --  33.2  --  33.2  --   --  32.1 31.7  RDW 17.6*   < > 14.9  --  15.7*  --  15.1  --   --  15.4 15.9*  LYMPHSABS 0.9  --   --   --   --   --   --   --   --   --   --   MONOABS 0.5  --   --   --   --   --   --   --   --   --   --   EOSABS 0.1  --   --   --   --   --   --   --   --   --   --   BASOSABS 0.1  --   --   --   --   --   --   --   --   --   --    < > = values in this interval not displayed.    Chemistries  Recent Labs  Lab 06/29/19 1610 06/30/19 0456 07/01/19 0617 07/02/19 0719 07/04/19 1023  NA 138 137 136 133* 136  K 5.0 5.5* 4.3 5.2* 4.0  CL 103 108 101 99 102  CO2 17* 18* 23 20* 24  GLUCOSE 101* 99 85 110* 125*  BUN 91* 110* 52* 64* 44*  CREATININE 7.39* 7.25* 4.18* 5.19* 5.05*  CALCIUM 8.1* 7.7* 7.6* 8.0* 8.6*  AST 15  --   --   --   --   ALT 11  --   --   --   --   ALKPHOS 43  --   --   --   --   BILITOT 0.7  --   --   --   --    ------------------------------------------------------------------------------------------------------------------ No results for input(s): CHOL, HDL, LDLCALC, TRIG, CHOLHDL, LDLDIRECT in the last 72 hours.  Lab Results  Component Value Date   HGBA1C 4.9 06/19/2019   ------------------------------------------------------------------------------------------------------------------ No results for input(s): TSH, T4TOTAL, T3FREE, THYROIDAB in the  last 72 hours.  Invalid input(s): FREET3 ------------------------------------------------------------------------------------------------------------------  No results for input(s): VITAMINB12, FOLATE, FERRITIN, TIBC, IRON, RETICCTPCT in the last 72 hours.  Coagulation profile No results for input(s): INR, PROTIME in the last 168 hours.  No results for input(s): DDIMER in the last 72 hours.  Cardiac Enzymes No results for input(s): CKMB, TROPONINI, MYOGLOBIN in the last 168 hours.  Invalid input(s): CK ------------------------------------------------------------------------------------------------------------------    Component Value Date/Time   BNP 2,007.2 (H) 03/25/2019 1313    Inpatient Medications  Scheduled Meds: . allopurinol  100 mg Oral Daily  . amiodarone  200 mg Oral BID  . calcitRIOL  1 mcg Oral Q M,W,F-HD  . Chlorhexidine Gluconate Cloth  6 each Topical Q0600  . cholecalciferol  2,000 Units Oral Daily  . darbepoetin (ARANESP) injection - DIALYSIS  200 mcg Intravenous Q Sat-HD  . dicyclomine  10 mg Oral QHS  . docusate sodium  100 mg Oral BID  . feeding supplement (NEPRO CARB STEADY)  237 mL Oral BID BM  . ferric citrate  630 mg Oral TID WC  . haloperidol lactate  4 mg Intravenous Once  . isosorbide mononitrate  30 mg Oral QHS  . metoprolol tartrate  25 mg Oral BID  . mometasone-formoterol  2 puff Inhalation BID  . pantoprazole  40 mg Intravenous Q12H  . pravastatin  80 mg Oral QPM  . sodium chloride flush  3 mL Intravenous Q12H  . sucralfate  1 g Oral Q6H   Continuous Infusions: . ferric gluconate (FERRLECIT/NULECIT) IV 125 mg (07/01/19 1231)   PRN Meds:.acetaminophen **OR** acetaminophen, alteplase, clobetasol cream, diphenhydrAMINE, heparin sodium (porcine), hydrALAZINE, ipratropium-albuterol, lidocaine (PF), lidocaine-prilocaine, lidocaine-prilocaine, nitroGLYCERIN, ondansetron, oxyCODONE-acetaminophen, pentafluoroprop-tetrafluoroeth, polyethylene glycol,  senna-docusate  Micro Results Recent Results (from the past 240 hour(s))  SARS Coronavirus 2 by RT PCR (hospital order, performed in [place] hospital lab) Nasopharyngeal Nasopharyngeal Swab     Status: None   Collection Time: 06/29/19  5:01 PM   Specimen: Nasopharyngeal Swab  Result Value Ref Range Status   SARS Coronavirus 2 NEGATIVE NEGATIVE Final    Comment: (NOTE) SARS-CoV-2 target nucleic acids are NOT DETECTED. The SARS-CoV-2 RNA is generally detectable in upper and lower respiratory specimens during the acute phase of infection. The lowest concentration of SARS-CoV-2 viral copies this assay can detect is 250 copies / mL. A negative result does not preclude SARS-CoV-2 infection and should not be used as the sole basis for treatment or other patient management decisions.  A negative result may occur with improper specimen collection / handling, submission of specimen other than nasopharyngeal swab, presence of viral mutation(s) within the areas targeted by this assay, and inadequate number of viral copies (<250 copies / mL). A negative result must be combined with clinical observations, patient history, and epidemiological information. Fact Sheet for Patients:   StrictlyIdeas.no Fact Sheet for Healthcare Providers: BankingDealers.co.za This test is not yet approved or cleared  by the [place] FDA and has been authorized for detection and/or diagnosis of SARS-CoV-2 by FDA under an Emergency Use Authorization (EUA).  This EUA will remain in effect (meaning this test can be used) for the duration of the COVID-19 declaration under Section 564(b)(1) of the Act, 21 U.S.C. section 360bbb-3(b)(1), unless the authorization is terminated or revoked sooner. Performed at [facility], [place] [address], [place],  [address]   MRSA PCR Screening     Status: None   Collection Time: 06/30/19  5:37 PM   Specimen:  Nasopharyngeal  Result Value Ref Range Status   MRSA by  PCR NEGATIVE NEGATIVE Final    Comment:        The GeneXpert MRSA Assay (FDA approved for NASAL specimens only), is one component of a comprehensive MRSA colonization surveillance program. It is not intended to diagnose MRSA infection nor to guide or monitor treatment for MRSA infections. Performed at [facility], [place] [address] [place], [place] [address]     Radiology Reports CT ANGIO HEAD W OR WO CONTRAST  Result Date: 06/19/2019 CLINICAL DATA:  69 year old female dialysis patient with encephalopathy, confusion after dialysis. Recent MRI demonstrating a small chronic left occipital cortical infarct, nearby chronic left occipital pole encephalomalacia, underlying advanced chronic ischemic disease. EXAM: CT ANGIOGRAPHY HEAD AND NECK TECHNIQUE: Multidetector CT imaging of the head and neck was performed using the standard protocol during bolus administration of intravenous contrast. Multiplanar CT image reconstructions and MIPs were obtained to evaluate the vascular anatomy. Carotid stenosis measurements (when applicable) are obtained utilizing NASCET criteria, using the distal internal carotid diameter as the denominator. CONTRAST:  89mL OMNIPAQUE IOHEXOL 350 MG/ML SOLN COMPARISON:  Brain MRI and head CT 06/17/2019. Neck CT without contrast 07/11/2016 FINDINGS: CT HEAD Brain: Subtle cortical hypodensity now on the left lateral occiput corresponding to the DWI lesion (series 5, image 14). No acute intracranial hemorrhage or mass effect. Otherwise Stable non contrast CT appearance of the brain. Calvarium and skull base: No acute osseous abnormality identified. Paranasal sinuses: Visualized paranasal sinuses and mastoids are stable and well pneumatized. Orbits: Stable orbit and scalp soft tissues. CTA NECK Skeleton: Absent maxillary dentition. Advanced cervical spine degeneration with reversal of lordosis. Chronic deformity posterior  right 1st and 2nd ribs. No acute osseous abnormality identified. Upper chest: Small layering pleural effusions are partially visible. Mild mosaic pulmonary attenuation. Visible precarinal and AP window lymph nodes are at the upper limits of normal. Right IJ approach dual lumen dialysis catheter. Other neck: No acute findings.  No cervical lymphadenopathy. Aortic arch: Severe atherosclerosis of the visible aorta including bulky mural plaque or thrombus throughout the arch (series 12, images 80 through 134). Three vessel arch configuration. Right carotid system: No brachiocephalic artery or right CCA origin stenosis despite plaque. Tortuous proximal right CCA. Intermittent plaque to the bifurcation without stenosis. Less than 50 % stenosis with respect to the distal vessel of the proximal right ICA despite plaque. Left carotid system: Prominent plaque at the left CCA origin and along the ventral vessel at the thoracic inlet but less than 50% stenosis (series 9, image 132). At the level of the thyroid combined soft and calcified plaque results in 60% stenosis (series 9, image 122). Capacious left carotid bifurcation with surgical clips compatible with previous endarterectomy. Tortuous left ICA distal to the bulb without stenosis to the skull base. Vertebral arteries: Proximal right subclavian soft and calcified plaque similar to the other great vessels. Moderate stenosis right vertebral artery origin best seen on series 12. Tortuous right V1 segment. No additional right vertebral stenosis to the skull base. Widespread proximal left subclavian artery plaque similar to the other great vessels without significant stenosis. Calcified plaque at the left vertebral artery origin, moderate stenosis best seen on series 11, image 262. Tortuous left V1 segment. Codominant left vertebral artery otherwise patent to the skull base. CTA HEAD Posterior circulation: Bilateral V4 segment soft and calcified plaque. No significant  stenosis on the left but moderate to severe right V4 stenosis just distal to PICA on series 12, image 113. Normal left PICA. Patent vertebrobasilar junction. Patent basilar artery  with only mild irregularity. Normal SCA and PCA origins. Posterior communicating arteries are diminutive or absent. The left PCA P3 superior division is occluded, although there is chronic encephalomalacia in the left PCA territory also. On the right there is mild to moderate P1 and P2 irregularity and stenosis. Anterior circulation: Both ICA siphons are patent. Heavy calcification through the left cavernous segment and anterior genu with mild to moderate stenosis. Similar right siphon calcified plaque with at least moderate stenosis at the anterior genu. Patent carotid termini. Normal MCA and ACA origins. Anterior communicating artery and bilateral ACA branches are within normal limits. Left MCA M1 segment and bifurcation are patent without stenosis. Right MCA M1 segment and bifurcation are patent without stenosis. Bilateral MCA branches are within normal limits. Venous sinuses: Early contrast timing but grossly patent. Anatomic variants: None. Review of the MIP images confirms the above findings IMPRESSION: 1. Negative for large vessel occlusion. 2. Positive for bulky Mural Soft Plaque versus Thrombus throughout the Aortic Arch. See series 12 images 80-134. Aortic Atherosclerosis (ICD10-I70.0). 3. Positive for occluded Left PCA superior P3 branch, although age indeterminate in light of some chronic left PCA territory encephalomalacia. 4. Positive also for significant atherosclerotic stenoses: - Left CCA 60% stenosis at the level of the thyroid. - moderate bilateral vertebral artery origin stenosis, and up to Severe Right Vertebral V4 segment stenosis distal to PICA. - bilateral ICA siphon stenosis, up to moderate on the Left and Moderate To Severe at the Right ICA anterior genu. - moderate Right PCA P2 stenosis. 5.  Stable non contrast  CT appearance of the brain. 6. Small bilateral pleural effusions. Advanced cervical spine degeneration. Electronically Signed   By: Genevie Ann M.D.   On: 06/19/2019 13:25   DG Chest 1 View  Result Date: 06/17/2019 CLINICAL DATA:  Hematemesis, history small bowel obstruction, hypertension, coronary artery disease post stenting, COPD, end-stage renal disease on hemodialysis, anemia secondary to GI bleed EXAM: CHEST  1 VIEW COMPARISON:  Portable exam 1506 hours compared to 06/14/2019 FINDINGS: RIGHT jugulars dialysis catheter with tip projecting over RIGHT atrium. Enlargement of cardiac silhouette with slight pulmonary vascular congestion. Atherosclerotic calcification aorta. Minimal perihilar to basilar pulmonary infiltrates consistent with pulmonary edema. No pleural effusion or pneumothorax. Bones demineralized. IMPRESSION: Enlargement of cardiac silhouette with pulmonary vascular congestion and minimal pulmonary edema. Electronically Signed   By: Lavonia Dana M.D.   On: 06/17/2019 15:26   CT HEAD WO CONTRAST  Result Date: 06/17/2019 CLINICAL DATA:  Delirium/confusion after dialysis EXAM: CT HEAD WITHOUT CONTRAST TECHNIQUE: Contiguous axial images were obtained from the base of the skull through the vertex without intravenous contrast. COMPARISON:  July 17, 2018 maxillofacial CT covering much of the brain parenchyma FINDINGS: Brain: There is mild diffuse atrophy. There is no intracranial mass, hemorrhage, extra-axial fluid collection, or midline shift. There is widespread small vessel disease throughout the centra semiovale bilaterally. There is a prior appearing infarct in the superior mid left frontal lobe with involvement of both gray and white matter in this area. There is a prior appearing infarct in the posterosuperior left parietal lobe at the gray-white junction. There is evidence of a prior infarct in the medial right parietooccipital junction region. There is a prior infarct in the head of the caudate  nucleus on the right. There is an age uncertain infarct in the left thalamus which has somewhat indistinct borders suggesting that this infarct may be recent/acute. There are small lacunar type infarcts in each cerebellar hemisphere, more on  the right than on the left. Vascular: No hyperdense vessel. There are foci of calcification in each carotid siphon and distal vertebral artery region. Skull: The bony calvarium a appears intact. Sinuses/Orbits: Paranasal sinuses are clear. There is rightward deviation of the nasal septum. Orbits appear symmetric bilaterally. Other: Mastoid air cells are clear. There is debris in each external auditory canal. Brain: It IMPRESSION: Mild atrophy. Extensive supratentorial small vessel disease. 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 And head of caudate nucleus on the right. Suspect more recent and potentially acute focal infarct in the left thalamus. Lacunar type infarcts noted in the cerebellar hemispheres bilaterally, more on the right than on the left. No mass or hemorrhage. Foci of arterial vascular calcification noted at multiple levels. Probable cerumen in each external auditory canal. Deviated nasal septum. Electronically Signed   By: Lowella Grip III M.D.   On: 06/17/2019 16:19   CT ANGIO NECK W OR WO CONTRAST  Result Date: 06/19/2019 CLINICAL DATA:  69 year old female dialysis patient with encephalopathy, confusion after dialysis. Recent MRI demonstrating a small chronic left occipital cortical infarct, nearby chronic left occipital pole encephalomalacia, underlying advanced chronic ischemic disease. EXAM: CT ANGIOGRAPHY HEAD AND NECK TECHNIQUE: Multidetector CT imaging of the head and neck was performed using the standard protocol during bolus administration of intravenous contrast. Multiplanar CT image reconstructions and MIPs were obtained to evaluate  the vascular anatomy. Carotid stenosis measurements (when applicable) are obtained utilizing NASCET criteria, using the distal internal carotid diameter as the denominator. CONTRAST:  81mL OMNIPAQUE IOHEXOL 350 MG/ML SOLN COMPARISON:  Brain MRI and head CT 06/17/2019. Neck CT without contrast 07/11/2016 FINDINGS: CT HEAD Brain: Subtle cortical hypodensity now on the left lateral occiput corresponding to the DWI lesion (series 5, image 14). No acute intracranial hemorrhage or mass effect. Otherwise Stable non contrast CT appearance of the brain. Calvarium and skull base: No acute osseous abnormality identified. Paranasal sinuses: Visualized paranasal sinuses and mastoids are stable and well pneumatized. Orbits: Stable orbit and scalp soft tissues. CTA NECK Skeleton: Absent maxillary dentition. Advanced cervical spine degeneration with reversal of lordosis. Chronic deformity posterior right 1st and 2nd ribs. No acute osseous abnormality identified. Upper chest: Small layering pleural effusions are partially visible. Mild mosaic pulmonary attenuation. Visible precarinal and AP window lymph nodes are at the upper limits of normal. Right IJ approach dual lumen dialysis catheter. Other neck: No acute findings.  No cervical lymphadenopathy. Aortic arch: Severe atherosclerosis of the visible aorta including bulky mural plaque or thrombus throughout the arch (series 12, images 80 through 134). Three vessel arch configuration. Right carotid system: No brachiocephalic artery or right CCA origin stenosis despite plaque. Tortuous proximal right CCA. Intermittent plaque to the bifurcation without stenosis. Less than 50 % stenosis with respect to the distal vessel of the proximal right ICA despite plaque. Left carotid system: Prominent plaque at the left CCA origin and along the ventral vessel at the thoracic inlet but less than 50% stenosis (series 9, image 132). At the level of the thyroid combined soft and calcified plaque  results in 60% stenosis (series 9, image 122). Capacious left carotid bifurcation with surgical clips compatible with previous endarterectomy. Tortuous left ICA distal to the bulb without stenosis to the skull base. Vertebral arteries: Proximal right subclavian soft and calcified plaque similar to the other great vessels. Moderate stenosis right vertebral artery origin best seen on series 12. Tortuous right V1 segment.  No additional right vertebral stenosis to the skull base. Widespread proximal left subclavian artery plaque similar to the other great vessels without significant stenosis. Calcified plaque at the left vertebral artery origin, moderate stenosis best seen on series 11, image 262. Tortuous left V1 segment. Codominant left vertebral artery otherwise patent to the skull base. CTA HEAD Posterior circulation: Bilateral V4 segment soft and calcified plaque. No significant stenosis on the left but moderate to severe right V4 stenosis just distal to PICA on series 12, image 113. Normal left PICA. Patent vertebrobasilar junction. Patent basilar artery with only mild irregularity. Normal SCA and PCA origins. Posterior communicating arteries are diminutive or absent. The left PCA P3 superior division is occluded, although there is chronic encephalomalacia in the left PCA territory also. On the right there is mild to moderate P1 and P2 irregularity and stenosis. Anterior circulation: Both ICA siphons are patent. Heavy calcification through the left cavernous segment and anterior genu with mild to moderate stenosis. Similar right siphon calcified plaque with at least moderate stenosis at the anterior genu. Patent carotid termini. Normal MCA and ACA origins. Anterior communicating artery and bilateral ACA branches are within normal limits. Left MCA M1 segment and bifurcation are patent without stenosis. Right MCA M1 segment and bifurcation are patent without stenosis. Bilateral MCA branches are within normal limits.  Venous sinuses: Early contrast timing but grossly patent. Anatomic variants: None. Review of the MIP images confirms the above findings IMPRESSION: 1. Negative for large vessel occlusion. 2. Positive for bulky Mural Soft Plaque versus Thrombus throughout the Aortic Arch. See series 12 images 80-134. Aortic Atherosclerosis (ICD10-I70.0). 3. Positive for occluded Left PCA superior P3 branch, although age indeterminate in light of some chronic left PCA territory encephalomalacia. 4. Positive also for significant atherosclerotic stenoses: - Left CCA 60% stenosis at the level of the thyroid. - moderate bilateral vertebral artery origin stenosis, and up to Severe Right Vertebral V4 segment stenosis distal to PICA. - bilateral ICA siphon stenosis, up to moderate on the Left and Moderate To Severe at the Right ICA anterior genu. - moderate Right PCA P2 stenosis. 5.  Stable non contrast CT appearance of the brain. 6. Small bilateral pleural effusions. Advanced cervical spine degeneration. Electronically Signed   By: Genevie Ann M.D.   On: 06/19/2019 13:25   MR BRAIN WO CONTRAST  Result Date: 06/17/2019 CLINICAL DATA:  69 year old female with encephalopathy. Confusion after dialysis. EXAM: MRI HEAD WITHOUT CONTRAST TECHNIQUE: Multiplanar, multiecho pulse sequences of the brain and surrounding structures were obtained without intravenous contrast. COMPARISON:  Head CT without contrast earlier today. FINDINGS: The examination had to be discontinued prior to completion by patient request. Diffusion-weighted imaging, axial T2 imaging, and moderately degraded sagittal T1 imaging only was obtained. Major intracranial vascular flow voids are preserved. There is a tiny focus of restricted diffusion in the cortex of the left lateral occipital lobe. No other convincing restricted diffusion. See series 5, image 69. Nearby chronic cortical encephalomalacia in the left occipital pole. There are scattered small chronic infarcts in both  cerebellar hemispheres, in the bilateral basal ganglia. There are chronic cortical infarcts in the bilateral MCA territories, and right posterior MCA/PCA watershed with cortical encephalomalacia. Superimposed confluent bilateral cerebral white matter T2 hyperintensity. No midline shift, mass effect, evidence of mass lesion, ventriculomegaly, extra-axial collection or acute intracranial hemorrhage is evident. Paranasal Visualized paranasal sinuses and mastoids are stable and well pneumatized. Visible internal auditory structures appear normal. Mildly asymmetric size of the right superior ophthalmic vein,  but otherwise negative orbits. Unremarkable noncontrast visible cavernous sinus. IMPRESSION: 1. Truncated exam by patient request. 2. Positive for a tiny acute cortical infarct in the left occipital lobe, located near a chronic left PCA infarct. 3. Underlying advanced chronic ischemic disease in the bilateral cerebellum and bilateral MCA territories. Electronically Signed   By: Genevie Ann M.D.   On: 06/17/2019 19:10   CT ABDOMEN PELVIS W CONTRAST  Result Date: 06/14/2019 CLINICAL DATA:  Vomiting and diarrhea beginning this morning. Abdominal pain and tenderness with palpation. EXAM: CT ABDOMEN AND PELVIS WITH CONTRAST TECHNIQUE: Multidetector CT imaging of the abdomen and pelvis was performed using the standard protocol following bolus administration of intravenous contrast. CONTRAST:  68mL OMNIPAQUE IOHEXOL 300 MG/ML  SOLN COMPARISON:  04/23/2019 FINDINGS: Lower Chest: New small bilateral pleural effusions and dependent bibasilar atelectasis. Stable cardiomegaly. Hepatobiliary: No hepatic masses identified. Gallbladder is unremarkable. No evidence of biliary ductal dilatation. Pancreas:  No mass or inflammatory changes. Spleen: Within normal limits in size and appearance. Adrenals/Urinary Tract: Stable bilateral adrenal masses, consistent with benign adenomas. Diffuse bilateral renal atrophy and tiny bilateral  renal cysts remain stable. No evidence of ureteral calculi or hydronephrosis. Unremarkable unopacified urinary bladder. Stomach/Bowel: Diffuse duodenal wall thickening and mucosal enhancement has increased since previous study, consistent with duodenitis. A small postbulbar duodenal ulcer is seen and decreased in size since previous study. No evidence of free intraperitoneal air. Vascular/Lymphatic: No pathologically enlarged lymph nodes. No abdominal aortic aneurysm. Aortic atherosclerosis noted. Reproductive: Prior hysterectomy noted. Adnexal regions are unremarkable in appearance. Other: Several small midline epigastric ventral hernias are again seen which contain only fat. No evidence of herniated bowel loops. Increased diffuse body wall edema since prior exam. Musculoskeletal:  No suspicious bone lesions identified. IMPRESSION: 1. Increased diffuse duodenal wall thickening and mucosal enhancement, consistent with duodenitis. 2. Decreased size of small postbulbar duodenal ulcer. 3. New small bilateral pleural effusions diffuse body wall edema. 4. Stable small epigastric ventral hernias containing only fat. 5. Stable benign bilateral adrenal adenomas. Aortic Atherosclerosis (ICD10-I70.0). Electronically Signed   By: Marlaine Hind M.D.   On: 06/14/2019 15:21   DG CHEST PORT 1 VIEW  Result Date: 06/30/2019 CLINICAL DATA:  GI bleed EXAM: PORTABLE CHEST 1 VIEW COMPARISON:  06/19/2019 FINDINGS: Right-sided hemodialysis catheter terminates at the level of the right atrium. Stable mild cardiomegaly. Atherosclerotic calcification of the aortic knob. Mild diffuse interstitial markings suggesting mild edema. No focal airspace consolidation. No pleural effusion or pneumothorax. IMPRESSION: Mildly prominent diffuse interstitial markings suggesting mild edema. Electronically Signed   By: Davina Poke D.O.   On: 06/30/2019 16:14   DG CHEST PORT 1 VIEW  Result Date: 06/19/2019 CLINICAL DATA:  End-stage renal disease.   Dialysis. EXAM: PORTABLE CHEST 1 VIEW COMPARISON:  Jun 17, 2019 FINDINGS: The dialysis catheter terminates near the caval atrial junction. No pneumothorax. No overt edema. No nodules or masses. No focal infiltrates. There may be a small left effusion. The cardiomediastinal silhouette is stable. IMPRESSION: No overt edema. Stable dialysis catheter. There may be a small left effusion. Electronically Signed   By: Dorise Bullion III M.D   On: 06/19/2019 13:30   DG Chest Port 1 View  Result Date: 06/14/2019 CLINICAL DATA:  Weakness. Dialysis patient. EXAM: PORTABLE CHEST 1 VIEW COMPARISON:  Chest x-ray 05/22/2019 FINDINGS: The right IJ dialysis catheter is in good position, unchanged. The cardiac silhouette, mediastinal and hilar contours are within normal limits. Mild tortuosity and calcification of the thoracic aorta. No acute pulmonary  findings. No pleural effusions. No pulmonary lesions. IMPRESSION: No acute cardiopulmonary findings. Electronically Signed   By: Marijo Sanes M.D.   On: 06/14/2019 10:49   ECHOCARDIOGRAM COMPLETE  Result Date: 06/20/2019    ECHOCARDIOGRAM REPORT   Patient Name:   CHARITIE HINOTE Date of Exam: 06/20/2019 Medical Rec #:  [id number]          Height:       62.0 in Accession #:    [id number]         Weight:       112.0 lb Date of Birth:  1951/01/25           BSA:          1.494 m Patient Age:    68 years           BP:           157/59 mmHg Patient Gender: F                  HR:           82 bpm. Exam Location:  Inpatient Procedure: 2D Echo, Color Doppler and Cardiac Doppler Indications:    Stroke 434.91/ I163.9  History:        Patient has prior history of Echocardiogram examinations, most                 recent 01/25/2019. CAD, COPD, Arrythmias:Atrial Fibrillation;                 Risk Factors:Hypertension, Dyslipidemia and GERD. CKD.  Sonographer:    Jonelle Sidle Dance Referring Phys: [id number] ASHISH ARORA IMPRESSIONS  1. Left ventricular ejection fraction, by estimation, is 65 to 70%.  The left ventricle has normal function. The left ventricle has no regional wall motion abnormalities. Left ventricular diastolic parameters are consistent with Grade II diastolic dysfunction (pseudonormalization).  2. Right ventricular systolic function is normal. The right ventricular size is normal. There is moderately elevated pulmonary artery systolic pressure. The estimated right ventricular systolic pressure is 17.9 mmHg.  3. Left atrial size was mild to moderately dilated.  4. Right atrial size was mildly dilated.  5. The mitral valve is normal in structure. Moderate mitral valve regurgitation. No evidence of mitral stenosis.  6. Tricuspid valve regurgitation is moderate.  7. The aortic valve is normal in structure. Aortic valve regurgitation is not visualized. No aortic stenosis is present. FINDINGS  Left Ventricle: Left ventricular ejection fraction, by estimation, is 65 to 70%. The left ventricle has normal function. The left ventricle has no regional wall motion abnormalities. The left ventricular internal cavity size was normal in size. There is  no left ventricular hypertrophy. Left ventricular diastolic parameters are consistent with Grade II diastolic dysfunction (pseudonormalization). Right Ventricle: The right ventricular size is normal. No increase in right ventricular wall thickness. Right ventricular systolic function is normal. There is moderately elevated pulmonary artery systolic pressure. The tricuspid regurgitant velocity is 3.35 m/s, and with an assumed right atrial pressure of 8 mmHg, the estimated right ventricular systolic pressure is 15.0 mmHg. Left Atrium: Left atrial size was mild to moderately dilated. Right Atrium: Right atrial size was mildly dilated. Pericardium: There is no evidence of pericardial effusion. Mitral Valve: The mitral valve is normal in structure. Moderate mitral valve regurgitation. No evidence of mitral valve stenosis. Tricuspid Valve: The tricuspid valve is  grossly normal. Tricuspid valve regurgitation is moderate . No evidence of tricuspid stenosis. Aortic Valve: The aortic valve is normal  in structure. Aortic valve regurgitation is not visualized. Aortic regurgitation PHT measures 378 msec. No aortic stenosis is present. Pulmonic Valve: The pulmonic valve was normal in structure. Pulmonic valve regurgitation is trivial. Aorta: The aortic root and ascending aorta are structurally normal, with no evidence of dilitation. IAS/Shunts: The atrial septum is grossly normal.  LEFT VENTRICLE PLAX 2D LVIDd:         4.00 cm  Diastology LVIDs:         2.36 cm  LV e' lateral:   6.90 cm/s LV PW:         0.96 cm  LV E/e' lateral: 21.7 LV IVS:        0.91 cm  LV e' medial:    5.10 cm/s LVOT diam:     1.70 cm  LV E/e' medial:  29.4 LV SV:         45 LV SV Index:   30 LVOT Area:     2.27 cm  RIGHT VENTRICLE             IVC RV Basal diam:  3.02 cm     IVC diam: 2.27 cm RV Mid diam:    1.83 cm RV S prime:     10.80 cm/s TAPSE (M-mode): 1.8 cm LEFT ATRIUM             Index       RIGHT ATRIUM           Index LA diam:        3.80 cm 2.54 cm/m  RA Area:     19.50 cm LA Vol (A2C):   74.0 ml 49.53 ml/m RA Volume:   54.70 ml  36.61 ml/m LA Vol (A4C):   54.7 ml 36.61 ml/m LA Biplane Vol: 64.4 ml 43.10 ml/m  AORTIC VALVE LVOT Vmax:   93.80 cm/s LVOT Vmean:  61.700 cm/s LVOT VTI:    0.199 m AI PHT:      378 msec  AORTA Ao Root diam: 3.00 cm Ao Asc diam:  2.70 cm MITRAL VALVE                 TRICUSPID VALVE MV Area (PHT): 2.76 cm      TR Peak grad:   44.9 mmHg MV Decel Time: 275 msec      TR Vmax:        335.00 cm/s MR Peak grad:    126.8 mmHg MR Mean grad:    86.0 mmHg   SHUNTS MR Vmax:         563.00 cm/s Systemic VTI:  0.20 m MR Vmean:        437.0 cm/s  Systemic Diam: 1.70 cm MR PISA:         0.57 cm MR PISA Eff ROA: 4 mm MR PISA Radius:  0.30 cm MV E velocity: 150.00 cm/s MV A velocity: 77.10 cm/s MV E/A ratio:  1.95 Mertie Moores MD Electronically signed by Mertie Moores MD  Signature Date/Time: 06/20/2019/1:18:33 PM    Final      Phillips Climes M.D on 07/04/2019 at 2:06 PM    [facility] -  Office  [phone]

## 2019-07-04 NOTE — Progress Notes (Addendum)
[facility] Progress Note   Subjective:   Patient seen and examined at bedside in room.  No bloody stool or melena as far as she knows. Denies abdominal pain, CP, SOB, n/v/d.  Edema improving.  Complains of being hungry. Reports successful use of LU AVF with dialysis.    Objective Vitals:   07/04/19 0600 07/04/19 0804 07/04/19 0824 07/04/19 0926  BP: (!) 146/79 (!) 173/70  (!) 188/65  Pulse: 64 64  67  Resp: 15 12    Temp:    97.6 F (36.4 C)  TempSrc:    Oral  SpO2: 94% 98% 98% 98%  Weight:      Height:       Physical Exam General:chronically ill appearing female in NAD Heart:RRR Lung: +scattered rhonchi  Abdomen:soft, NTND Extremities:trace LE edema Dialysis Access: LU AVF +b, [facility]   Filed Weights   07/02/19 1206 07/03/19 0513 07/04/19 0410  Weight: 50.3 kg 51.2 kg 49 kg    Intake/Output Summary (Last 24 hours) at 07/04/2019 1019 Last data filed at 07/04/2019 0805 Gross per 24 hour  Intake 893.55 ml  Output 1475 ml  Net -581.45 ml    Additional Objective Labs: Basic Metabolic Panel: Recent Labs  Lab 06/30/19 0456 07/01/19 0617 07/02/19 0719  NA 137 136 133*  K 5.5* 4.3 5.2*  CL 108 101 99  CO2 18* 23 20*  GLUCOSE 99 85 110*  BUN 110* 52* 64*  CREATININE 7.25* 4.18* 5.19*  CALCIUM 7.7* 7.6* 8.0*  PHOS 8.3*  --  5.3*   Liver Function Tests: Recent Labs  Lab 06/29/19 1610 06/30/19 0456 07/02/19 0719  AST 15  --   --   ALT 11  --   --   ALKPHOS 43  --   --   BILITOT 0.7  --   --   PROT 4.9*  --   --   ALBUMIN 2.2* 2.0* 2.0*   CBC: Recent Labs  Lab 06/29/19 1610 06/29/19 1610 06/30/19 0457 06/30/19 0457 06/30/19 1311 06/30/19 1852 07/01/19 0617 07/01/19 1549 07/01/19 2358 07/01/19 2358 07/02/19 0719 07/02/19 2335 07/03/19 1145  WBC 6.3   < > 6.1   < > 9.8  --  6.3  --  7.3  --   --   --  6.9  NEUTROABS 4.7  --   --   --   --   --   --   --   --   --   --   --   --   HGB 6.8*   < > 7.2*   < > 9.8*   < > 7.9*   < > 9.0*    < > 8.6* 9.8* 9.7*  HCT 22.5*   < > 23.7*   < > 29.2*   < > 23.8*   < > 27.1*   < > 26.4* 30.7* 30.2*  MCV 94.1   < > 93.3  --  88.8  --  89.8  --  89.7  --   --   --  92.1  PLT 261   < > 257   < > 166  --  177  --  178  --   --   --  217   < > = values in this interval not displayed.   Blood Culture  Medications: . ferric gluconate (FERRLECIT/NULECIT) IV 125 mg (07/01/19 1231)   . sodium chloride   Intravenous Once  . sodium chloride   Intravenous Once  .  allopurinol  100 mg Oral Daily  . amiodarone  200 mg Oral BID  . calcitRIOL  1 mcg Oral Q M,W,F-HD  . Chlorhexidine Gluconate Cloth  6 each Topical Q0600  . cholecalciferol  2,000 Units Oral Daily  . darbepoetin (ARANESP) injection - DIALYSIS  200 mcg Intravenous Q Sat-HD  . dicyclomine  10 mg Oral QHS  . docusate sodium  100 mg Oral BID  . feeding supplement (NEPRO CARB STEADY)  237 mL Oral BID BM  . ferric citrate  630 mg Oral TID WC  . haloperidol lactate  4 mg Intravenous Once  . isosorbide mononitrate  30 mg Oral QHS  . metoprolol tartrate  25 mg Oral BID  . mometasone-formoterol  2 puff Inhalation BID  . pantoprazole  40 mg Intravenous Q12H  . pravastatin  80 mg Oral QPM  . sodium chloride flush  3 mL Intravenous Q12H  . sucralfate  1 g Oral Q6H    Dialysis Orders: [facility] TTS 4 hr EDW 48 kgis ?? F180 BFR 350 mL/ min DFR A1.5 UF profile 2no heparin Venofer 100 mg q rx x 9, started 5/27 Mircera 200 q 2 weeks, last given 06/02/19 Calcitriol 1.0 cmg q rx  Assessment/Plan: 1.Recurrent GIB - melanoic stools on HD with abdominal pain -s/p 4 units PRBC GI following - presumed bleeding from previously diagnosed severe duodenal ulcerations, recent acute GIB in May with multiple deep duodenal ulcers - PPI indefinitely - Plavix & ASA on hold- for repeat EGD this week- continue PPI bid    2. ESRD- TTS  AVF used  2 needles 6/5 Qb 250 no problems with cannulation.- extra HD scheduled today for volume removal using AVF.   Resume regular schedule tomorrow - work around EGD. 3. Anemia- Acute on chronic 2/2 #1. hgb stabilized (6.8 > 7.2>9 6/4 > 9.8 6/5 > 9.7 6/6)- behind on ESA dosing - Aranesp 200 given 6/5 - continue course of Fe 4. Secondary hyperparathyroidism- resumed calcitriol, Ca and phos in goal.  5.HTN/volumeVolume status improving. Volume overloaded on admit. Generalized edema - EDW was raised from 40.5 >48 after last admission - post wt prior to last admission was 44 at the most - need to tritrate EDW back down some what.- net UF 4 L 6/5 with post wt 50.3 - plan for net UF 2.5-3L today. 6. Nutrition-alb very lowchange to renal diet, protein supplements, vit 7. Mesenteric ischemia s/p SMA stent 05/2019 8.PAD -ischemic left 4th and 5th toesDr Early seeing- planning for arteriogram when able- work around EGD 9. Atrial fibrillation -no anticoag due to GIB - - on amiodarone, in NSR 10. Hx cocaine abuse - UDS negative  11. 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 DNR 13. Disp - tentative SNF  Jen Mow, PA-C [facility] Pager: [phone] 07/04/2019,10:19 AM  LOS: 4 days

## 2019-07-04 NOTE — Progress Notes (Signed)
Physical Therapy Treatment Patient Details Name: Christina [name] MRN: [id number] DOB: 17-Aug-1950 Today's Date: 07/04/2019    History of Present Illness Pt is a 69 y/o female admitted secondary to rectoal prolapse and anemia. PMH includes HTN, depression, CAD s/p stent, COPD, ESRD on HD TTS, and a fib. Pt also with significant Bil foot pain to be assessed via arterogram by vascular surgery this week.      PT Comments    Pt was able to stand and pivot to the [facility] with help today, but has limited tolerance of WB on her painful feet.  She reports it was only a couple of days prior to admission that her feet hurt so bad that she could not stand on them.  She reports they are going to address the feet after they address her rectal issues.  PT will continue to follow acutely for safe mobility progression. Try stand pivot with RW next session, I think she would do better with this as she can use her strong arms to unweight her feet.    Follow Up Recommendations  SNF;Supervision/Assistance - 24 hour     Equipment Recommendations  Rolling walker with 5" wheels;Wheelchair (measurements PT);Wheelchair cushion (measurements PT)    Recommendations for Other Services   NA     Precautions / Restrictions Precautions Precautions: Fall Precaution Comments: primarily due to bil foot pain, pt is unable to bear weight on feet to walk, only long enough to pivot to [facility]    Mobility  Bed Mobility Overal bed mobility: Needs Assistance Bed Mobility: Supine to Sit;Sit to Supine Rolling: Supervision   Supine to sit: Supervision Sit to supine: Supervision   General bed mobility comments: supervision for safety  Transfers Overall transfer level: Needs assistance   Transfers: Sit to/from Stand;Stand Pivot Transfers Sit to Stand: Mod assist Stand pivot transfers: Mod assist       General transfer comment: Pt needed mod hand held assist to stand and pivot to [facility].  Would likely do better with RW next  time.    Ambulation/Gait             General Gait Details: unable due to significant bil foot pain/buring.          Balance Overall balance assessment: Needs assistance Sitting-balance support: Feet supported;Bilateral upper extremity supported;No upper extremity supported Sitting balance-Leahy Scale: Good     Standing balance support: Bilateral upper extremity supported Standing balance-Leahy Scale: Poor Standing balance comment: need external support in standing.                             Cognition Arousal/Alertness: Awake/alert Behavior During Therapy: WFL for tasks assessed/performed Overall Cognitive Status: Within Functional Limits for tasks assessed                                 General Comments: Not specifically tested, conversation and problem solving were normal during my session             Pertinent Vitals/Pain Pain Assessment: Faces Faces Pain Scale: Hurts whole lot Pain Location: bil feet Pain Descriptors / Indicators: Burning Pain Intervention(s): Limited activity within patient's tolerance;Monitored during session;Repositioned           PT Goals (current goals can now be found in the care plan section) Acute Rehab PT Goals Patient Stated Goal: to go to rehab Progress towards PT goals: Progressing  toward goals    Frequency    Min 2X/week      PT Plan Current plan remains appropriate       AM-PAC PT "6 Clicks" Mobility   Outcome Measure  Help needed turning from your back to your side while in a flat bed without using bedrails?: None Help needed moving from lying on your back to sitting on the side of a flat bed without using bedrails?: None Help needed moving to and from a bed to a chair (including a wheelchair)?: A Lot Help needed standing up from a chair using your arms (e.g., wheelchair or bedside chair)?: A Lot Help needed to walk in hospital room?: Total Help needed climbing 3-5 steps with a  railing? : Total 6 Click Score: 14    End of Session   Activity Tolerance: Patient limited by pain Patient left: in bed;with call bell/phone within reach   PT Visit Diagnosis: Muscle weakness (generalized) (M62.81);Difficulty in walking, not elsewhere classified (R26.2);Pain Pain - part of body: Ankle and joints of foot     Time: 1254-1316 PT Time Calculation (min) (ACUTE ONLY): 22 min  Charges:  $Therapeutic Activity: 8-22 mins                    Verdene Lennert, PT, DPT  Acute Rehabilitation [phone] pager #[phone] office     07/04/2019, 4:07 PM

## 2019-07-04 NOTE — Progress Notes (Signed)
This nurse spoke with Narda Rutherford RN regarding consult for Midline. This patient is a renal patient, actively receiving dialysis at this time.   This patient has already been assessed by 2 [facility]AST nurses today. Please see previous documentation. This nurse notified Janie RN to notify MD of above. VU. Fran Lowes, RN [facility]

## 2019-07-04 NOTE — Progress Notes (Signed)
   07/04/19 0400  Assess: MEWS Score  BP (!) 210/88  Pulse Rate 74  ECG Heart Rate 73  Resp 14  SpO2 98 %  O2 Device Room Air  Patient Activity (if Appropriate) In bed  Assess: MEWS Score  MEWS Temp 0  MEWS Systolic 2  MEWS Pulse 0  MEWS RR 0  MEWS LOC 0  MEWS Score 2  MEWS Score Color Yellow  Assess: if the MEWS score is Yellow or Red  Were vital signs taken at a resting state? Yes  Focused Assessment Documented focused assessment  Early Detection of Sepsis Score *See Row Information* Low  MEWS guidelines implemented *See Row Information* Yes  Treat  MEWS Interventions Administered prn meds/treatments  Take Vital Signs  Increase Vital Sign Frequency  Yellow: Q 2hr X 2 then Q 4hr X 2, if remains yellow, continue Q 4hrs  Escalate  MEWS: Escalate Yellow: discuss with charge nurse/RN and consider discussing with provider and RRT  Notify: Charge Nurse/RN  Name of Charge Nurse/RN Notified Terrana, RN  Date Charge Nurse/RN Notified 07/04/19  Time Charge Nurse/RN Notified 0428  Notify: Provider  Provider Name/Title Kennon Holter, NP  Date Provider Notified 07/04/19  Time Provider Notified [phone]  Notification Type Page  Notification Reason Other (Comment) (Hypertension.)  Response See new orders  Date of Provider Response 07/04/19  Time of Provider Response 0425  Document  Patient Outcome Stabilized after interventions   Manual BP 206/74 while patient resting in bed. Blount, NP notified via text page. Clonidine 0.2 mg given PO at 0444 per one time order. Repeat BP now 159/59 while patient resting in bed with eyes closed. Will continue to monitor.

## 2019-07-04 NOTE — Progress Notes (Signed)
   07/04/19 0326  Assess: MEWS Score  BP (!) 199/73  Pulse Rate 75  ECG Heart Rate 74  Resp (!) 26  SpO2 99 %  O2 Device Room Air  Patient Activity (if Appropriate) In bed  Assess: MEWS Score  MEWS Temp 0  MEWS Systolic 0  MEWS Pulse 0  MEWS RR 2  MEWS LOC 0  MEWS Score 2  MEWS Score Color Yellow  Assess: if the MEWS score is Yellow or Red  Were vital signs taken at a resting state? No (Patient restless in bed.)  Focused Assessment Documented focused assessment  Early Detection of Sepsis Score *See Row Information* Low  MEWS guidelines implemented *See Row Information* No, vital signs rechecked  Notify: Provider  Provider Name/Title Kennon Holter, NP  Date Provider Notified 07/04/19  Time Provider Notified [phone]  Notification Type Page  Notification Reason Other (Comment) (Continued Hypertension while patient in bed.)  Response See new orders  Date of Provider Response 07/04/19  Time of Provider Response [phone]  Document  Patient Outcome Not stable and remains on department   Blount, NP notified of no IV site, even with attempt by IV team. BP continues to be elevated while patient resting in bed. Patient remains asymptomatic. This RN to get manual BP.

## 2019-07-04 NOTE — H&P (View-Only) (Signed)
    Progress Note   Subjective  Chief Complaint: Melena, history of duodenal ulcer  Today, the patient is doing well.  Tells [place] that she has not had any loose stools recently and no dark stools.  Tells [place] that she is slightly confused but she has not gotten her breakfast yet this morning.  Is aware of plans for repeat procedure after Plavix washout.  No abdominal pain.   Objective   Vital signs in last 24 hours: Temp:  [97.4 F (36.3 C)-98.1 F (36.7 C)] 97.6 F (36.4 C) (06/07 0926) Pulse Rate:  [64-78] 67 (06/07 0926) Resp:  [9-26] 12 (06/07 0804) BP: (146-210)/(59-88) 188/65 (06/07 0926) SpO2:  [94 %-99 %] 98 % (06/07 0926) Weight:  [49 kg] 49 kg (06/07 0410) Last BM Date: 07/03/19 General:    white female in NAD Heart:  Regular rate and rhythm; no murmurs Lungs: Respirations even and unlabored, lungs CTA bilaterally Abdomen:  Soft, nontender and nondistended. Normal bowel sounds. Extremities:  Without edema. Neurologic:  Alert and oriented,  grossly normal neurologically. Psych:  Cooperative. Normal mood and affect.  Intake/Output from previous day: 06/06 0701 - 06/07 0700 In: 1115.6 [P.O.:939; I.V.:176.6] Out: 1200 [Urine:1200] Intake/Output this shift: Total I/O In: -  Out: 575 [Urine:575]  Lab Results: Recent Labs    07/01/19 2358 07/01/19 2358 07/02/19 0719 07/02/19 2335 07/03/19 1145  WBC 7.3  --   --   --  6.9  HGB 9.0*   < > 8.6* 9.8* 9.7*  HCT 27.1*   < > 26.4* 30.7* 30.2*  PLT 178  --   --   --  217   < > = values in this interval not displayed.   BMET Recent Labs    07/02/19 0719  NA 133*  K 5.2*  CL 99  CO2 20*  GLUCOSE 110*  BUN 64*  CREATININE 5.19*  CALCIUM 8.0*   LFT Recent Labs    07/02/19 0719  ALBUMIN 2.0*     Assessment / Plan:   Assessment: 1.  GI bleed: Admission 6/2, hemoglobin 6.8--> 3 units PRBCs at 9.8--> 7.8--> 1 unit PRBCs--> 9--> 9.7 today, Plavix on hold; most likely duodenal ulcerations which have been noted  twice now, extreme NSAID use over the last month (10-12 Ibuprofen daily as well as Aleve at times) 2.  Anemia 3.  Chronic anticoagulation: With Plavix, last dose 6/2  Plan: 1.  We will arrange for EGD tomorrow with Dr. Fuller Plan.  Did discuss risks,  benefits, limitations and alternatives and patient agrees to proceed. 2.  Patient will be n.p.o. after midnight 3.  Continue to monitor hemoglobin with transfusion as needed 4.  Continue to hold Plavix 5.  Please await any further recommendations from Dr. Bryan Lemma later today  Thank you for kind consultation, we will continue to follow.    LOS: 4 days   Levin Erp  07/04/2019, 10:17 AM

## 2019-07-04 NOTE — Progress Notes (Signed)
Pt MEWS status was Yellow due to high BP. PRN medication was administered and BP came down to 194/80. Pt may benefit from anxiety medication. To discuss with MD and will follow up with a note.

## 2019-07-04 NOTE — Progress Notes (Signed)
    Progress Note   Subjective  Chief Complaint: Melena, history of duodenal ulcer  Today, the patient is doing well.  Tells [place] that she has not had any loose stools recently and no dark stools.  Tells [place] that she is slightly confused but she has not gotten her breakfast yet this morning.  Is aware of plans for repeat procedure after Plavix washout.  No abdominal pain.   Objective   Vital signs in last 24 hours: Temp:  [97.4 F (36.3 C)-98.1 F (36.7 C)] 97.6 F (36.4 C) (06/07 0926) Pulse Rate:  [64-78] 67 (06/07 0926) Resp:  [9-26] 12 (06/07 0804) BP: (146-210)/(59-88) 188/65 (06/07 0926) SpO2:  [94 %-99 %] 98 % (06/07 0926) Weight:  [49 kg] 49 kg (06/07 0410) Last BM Date: 07/03/19 General:    white female in NAD Heart:  Regular rate and rhythm; no murmurs Lungs: Respirations even and unlabored, lungs CTA bilaterally Abdomen:  Soft, nontender and nondistended. Normal bowel sounds. Extremities:  Without edema. Neurologic:  Alert and oriented,  grossly normal neurologically. Psych:  Cooperative. Normal mood and affect.  Intake/Output from previous day: 06/06 0701 - 06/07 0700 In: 1115.6 [P.O.:939; I.V.:176.6] Out: 1200 [Urine:1200] Intake/Output this shift: Total I/O In: -  Out: 575 [Urine:575]  Lab Results: Recent Labs    07/01/19 2358 07/01/19 2358 07/02/19 0719 07/02/19 2335 07/03/19 1145  WBC 7.3  --   --   --  6.9  HGB 9.0*   < > 8.6* 9.8* 9.7*  HCT 27.1*   < > 26.4* 30.7* 30.2*  PLT 178  --   --   --  217   < > = values in this interval not displayed.   BMET Recent Labs    07/02/19 0719  NA 133*  K 5.2*  CL 99  CO2 20*  GLUCOSE 110*  BUN 64*  CREATININE 5.19*  CALCIUM 8.0*   LFT Recent Labs    07/02/19 0719  ALBUMIN 2.0*     Assessment / Plan:   Assessment: 1.  GI bleed: Admission 6/2, hemoglobin 6.8--> 3 units PRBCs at 9.8--> 7.8--> 1 unit PRBCs--> 9--> 9.7 today, Plavix on hold; most likely duodenal ulcerations which have been noted  twice now, extreme NSAID use over the last month (10-12 Ibuprofen daily as well as Aleve at times) 2.  Anemia 3.  Chronic anticoagulation: With Plavix, last dose 6/2  Plan: 1.  We will arrange for EGD tomorrow with Dr. Fuller Plan.  Did discuss risks,  benefits, limitations and alternatives and patient agrees to proceed. 2.  Patient will be n.p.o. after midnight 3.  Continue to monitor hemoglobin with transfusion as needed 4.  Continue to hold Plavix 5.  Please await any further recommendations from Dr. Bryan Lemma later today  Thank you for kind consultation, we will continue to follow.    LOS: 4 days   Levin Erp  07/04/2019, 10:17 AM

## 2019-07-05 ENCOUNTER — Inpatient Hospital Stay (HOSPITAL_COMMUNITY): Payer: Medicare Other | Admitting: Certified Registered Nurse Anesthetist

## 2019-07-05 ENCOUNTER — Encounter (HOSPITAL_COMMUNITY): Payer: Self-pay | Admitting: Internal Medicine

## 2019-07-05 ENCOUNTER — Encounter (HOSPITAL_COMMUNITY)
Admission: EM | Disposition: A | Discharge status: Skilled Nursing Facility | Payer: Self-pay | Source: Home / Self Care | Attending: Family Medicine

## 2019-07-05 DIAGNOSIS — K921 Melena: Secondary | ICD-10-CM

## 2019-07-05 DIAGNOSIS — K269 Duodenal ulcer, unspecified as acute or chronic, without hemorrhage or perforation: Secondary | ICD-10-CM

## 2019-07-05 HISTORY — PX: ESOPHAGOGASTRODUODENOSCOPY (EGD) WITH PROPOFOL: SHX5813

## 2019-07-05 HISTORY — PX: BIOPSY: SHX5522

## 2019-07-05 LAB — CBC
HCT: 31 % — ABNORMAL LOW (ref 36.0–46.0)
Hemoglobin: 10.1 g/dL — ABNORMAL LOW (ref 12.0–15.0)
MCH: 30.3 pg (ref 26.0–34.0)
MCHC: 32.6 g/dL (ref 30.0–36.0)
MCV: 93.1 fL (ref 80.0–100.0)
Platelets: 243 10*3/uL (ref 150–400)
RBC: 3.33 MIL/uL — ABNORMAL LOW (ref 3.87–5.11)
RDW: 16.1 % — ABNORMAL HIGH (ref 11.5–15.5)
WBC: 7.5 10*3/uL (ref 4.0–10.5)
nRBC: 0 % (ref 0.0–0.2)

## 2019-07-05 SURGERY — ESOPHAGOGASTRODUODENOSCOPY (EGD) WITH PROPOFOL
Anesthesia: Monitor Anesthesia Care

## 2019-07-05 MED ORDER — HYDROMORPHONE HCL 1 MG/ML IJ SOLN
0.5000 mg | INTRAMUSCULAR | Status: AC | PRN
  Administered 2019-07-05: 0.5 mg via SUBCUTANEOUS
  Filled 2019-07-05: qty 0.5

## 2019-07-05 MED ORDER — PROPOFOL 500 MG/50ML IV EMUL
INTRAVENOUS | Status: DC | PRN
  Administered 2019-07-05: 75 ug/kg/min via INTRAVENOUS

## 2019-07-05 MED ORDER — CHLORHEXIDINE GLUCONATE CLOTH 2 % EX PADS
6.0000 | MEDICATED_PAD | Freq: Every day | CUTANEOUS | Status: DC
  Administered 2019-07-07 – 2019-07-08 (×2): 6 via TOPICAL

## 2019-07-05 MED ORDER — SODIUM CHLORIDE 0.9% FLUSH: 10.0000 mL | INTRAVENOUS | Status: DC | PRN

## 2019-07-05 MED ORDER — SODIUM CHLORIDE 0.9 % IV SOLN: INTRAVENOUS | Status: DC

## 2019-07-05 MED ORDER — PROPOFOL 10 MG/ML IV BOLUS
INTRAVENOUS | Status: DC | PRN
  Administered 2019-07-05: 20 mg via INTRAVENOUS
  Administered 2019-07-05: 25 mg via INTRAVENOUS
  Administered 2019-07-05: 15 mg via INTRAVENOUS

## 2019-07-05 SURGICAL SUPPLY — 15 items

## 2019-07-05 NOTE — Progress Notes (Signed)
Patient back from EGD. No complaints. Full liquid tray ordered. Will continue to monitor.

## 2019-07-05 NOTE — Progress Notes (Signed)
[facility] Progress Note   Dialysis Orders: [facility] T[facility] 4 hr EDW 48 kgis ?? F180 BFR 350 mL/ min DFR A1.5 UF profile 2no heparin Venofer 100 mg q rx x 9, started 5/27 Mircera 200 q 2 weeks, last given 06/02/19 Calcitriol 1.0 cmg q rx  Assessment/Plan: 1.Recurrent GIB - melanoic stools on HD with abdominal pain -s/p 4 units PRBC GI following - presumed bleeding from previously diagnosed severe duodenal ulcerations, recent acute GIB in May with multiple deep duodenal ulcers - PPI indefinitely - On PPI bid    Repeat EGD 6/8 showed multiple plagues in the mid and distal esophagus, smal HH, two non-bleeding duodenal ulcers wtihout stigmata of bleeding, duodenal deformity, erythematous duodenopathy , = resume plavix in 3 day - continue no heparin HD  2. ESRD- TTS AVF used 2 needles 6/5Qb 250 no problems with cannulation.- extra HD Monday.  Now off schedule - high inpatient load so will not run today - plan next HD Wed. 3. Anemia- Acute on chronic 2/2 #1. hgb stabilized (6.8 > 7.2>9 6/4> 9.8 6/5> 9.7 6/6> 10.1 stable)- behind on ESA dosing -Aranesp 200 given 6/5 - continue course of Fe 4. Secondary hyperparathyroidism- resumed calcitriol, Ca and phos in goal.  5.HTN/volumeVolume status improving. Volume overloaded on admit. Generalized edema - EDW was raised from 40.5 >48 after last admission - post wt prior to last admission was 44 at the most - need to tritrate EDW back down some what.-net UF 4 L 6/5 with post wt 50.3 -net UF 2.97 6/7 - post wt not done - 49.2 was pre weight - so calculated post wt would be 46.7 6. Nutrition-alb very lowchange to renal diet, protein supplements, vit 7. Mesenteric ischemia s/p SMA stent 05/2019 8.PAD -ischemic left 4th and 5th toesDr Early seeing- planning for arteriogram per [facility] 9. Atrial fibrillation -no anticoag due to GIB - - on amiodarone,in NSR 10. Hx cocaine abuse - UDS negative  11. 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 DNR 13. Disp -tentative SNF  Myriam Jacobson, PA-C [place] [phone] 07/05/2019,9:14 AM  LOS: 5 days   Subjective:   Dabbing bright red blood on napkiin - during eating - tells me it is from her throat.   Objective Vitals:   07/04/19 2100 07/04/19 2200 07/05/19 0612 07/05/19 0719  BP: (!) 210/81 (!) 194/80 (!) 184/67 (!) 170/78  Pulse:   73 67  Resp: 18  18 12   Temp:   98.3 F (36.8 C) 98.2 F (36.8 C)  TempSrc:   Oral Oral  SpO2:   100%   Weight:      Height:       Physical Exam General: NAD quite talkative Heart: RRR Lungs: no rales Abdomen:  Soft NT Extremities: tr + 1 + LE edema toes/feet tender Dialysis Access:  [facility] and left upper AVF + bruit   Additional Objective Labs: Basic Metabolic Panel: Recent Labs  Lab 06/30/19 0456 06/30/19 0456 07/01/19 0617 07/02/19 0719 07/04/19 1023  NA 137   < > 136 133* 136  K 5.5*   < > 4.3 5.2* 4.0  CL 108   < > 101 99 102  CO2 18*   < > 23 20* 24  GLUCOSE 99   < > 85 110* 125*  BUN 110*   < > 52* 64* 44*  CREATININE 7.25*   < > 4.18* 5.19* 5.05*  CALCIUM 7.7*   < > 7.6* 8.0*  8.6*  PHOS 8.3*  --   --  5.3*  --    < > = values in this interval not displayed.   Liver Function Tests: Recent Labs  Lab 06/29/19 1610 06/30/19 0456 07/02/19 0719  AST 15  --   --   ALT 11  --   --   ALKPHOS 43  --   --   BILITOT 0.7  --   --   PROT 4.9*  --   --   ALBUMIN 2.2* 2.0* 2.0*   No results for input(s): LIPASE, AMYLASE in the last 168 hours. CBC: Recent Labs  Lab 06/29/19 1610 06/30/19 0457 07/01/19 0617 07/01/19 1549 07/01/19 2358 07/02/19 0719 07/03/19 1145 07/04/19 1023 07/05/19 0601  WBC 6.3   < > 6.3  --  7.3  --  6.9 8.0 7.5  NEUTROABS 4.7  --   --   --   --   --   --   --   --   HGB 6.8*   < > 7.9*   < > 9.0*   < > 9.7* 10.2* 10.1*  HCT 22.5*   < > 23.8*   < > 27.1*   < > 30.2* 32.2* 31.0*  MCV 94.1   < > 89.8  --  89.7  --  92.1 94.4 93.1   PLT 261   < > 177  --  178  --  217 281 243   < > = values in this interval not displayed.   Blood Culture    Component Value Date/Time   SDES BLOOD RIGHT HAND 06/17/2019 1223   [place]  06/17/2019 1223    BOTTLES DRAWN AEROBIC ONLY Blood Culture results may not be optimal due to an inadequate volume of blood received in culture bottles   CULT  06/17/2019 1223    NO GROWTH 5 DAYS Performed at [facility], [place] [address] [place], [place] [address]    REPTSTATUS 06/22/2019 FINAL 06/17/2019 1223    Cardiac Enzymes: No results for input(s): CKTOTAL, CKMB, CKMBINDEX, TROPONINI in the last 168 hours. CBG: No results for input(s): GLUCAP in the last 168 hours. Iron Studies: No results for input(s): IRON, TIBC, TRANSFERRIN, FERRITIN in the last 72 hours. Lab Results  Component Value Date   INR 1.2 06/14/2019   INR 1.1 03/25/2019   INR 0.9 01/26/2019   Studies/Results: No results found. Medications: . sodium chloride    . [MAR Hold] ferric gluconate (FERRLECIT/NULECIT) IV Stopped (07/04/19 1614)   . [MAR Hold] allopurinol  100 mg Oral Daily  . [MAR Hold] amiodarone  200 mg Oral BID  . [MAR Hold] calcitRIOL  1 mcg Oral Q M,W,F-HD  . [MAR Hold] Chlorhexidine Gluconate Cloth  6 each Topical Q0600  . [MAR Hold] cholecalciferol  2,000 Units Oral Daily  . [MAR Hold] darbepoetin (ARANESP) injection - DIALYSIS  200 mcg Intravenous Q Sat-HD  . [MAR Hold] dicyclomine  10 mg Oral QHS  . [MAR Hold] docusate sodium  100 mg Oral BID  . [MAR Hold] feeding supplement (NEPRO CARB STEADY)  237 mL Oral BID BM  . [MAR Hold] ferric citrate  630 mg Oral TID WC  . [MAR Hold] haloperidol lactate  4 mg Intravenous Once  . [MAR Hold] isosorbide mononitrate  30 mg Oral QHS  . [MAR Hold] LORazepam  1 mg Intravenous Once  . [MAR Hold] metoprolol tartrate  25 mg Oral BID  . [MAR Hold] mometasone-formoterol  2 puff Inhalation BID  . [  MAR Hold] pantoprazole  40 mg Intravenous Q12H  . [MAR Hold]  pravastatin  80 mg Oral QPM  . [MAR Hold] sodium chloride flush  3 mL Intravenous Q12H  . [MAR Hold] sucralfate  1 g Oral Q6H

## 2019-07-05 NOTE — Interval H&P Note (Signed)
History and Physical Interval Note:  07/05/2019 8:50 AM  Christina [name]  has presented today for surgery, with the diagnosis of Melena.  The various methods of treatment have been discussed with the patient and family. After consideration of risks, benefits and other options for treatment, the patient has consented to  Procedure(s): ESOPHAGOGASTRODUODENOSCOPY (EGD) WITH PROPOFOL (N/A) as a surgical intervention.  The patient's history has been reviewed, patient examined, no change in status, stable for surgery.  I have reviewed the patient's chart and labs.  Questions were answered to the patient's satisfaction.     Pricilla Riffle. Fuller Plan

## 2019-07-05 NOTE — TOC Initial Note (Signed)
Transition of Care [facility]) - Initial/Assessment Note    Patient Details  Name: Christina [name] MRN: [id number] Date of Birth: Jan 20, 1951  [facility] CM/SW Contact:    Benard Halsted, [place] Phone Number: 07/05/2019, 5:35 PM  Clinical Narrative:                 CSW spoke with patient's daugher regarding PT recommendation of SNF placement at time of discharge due to patient being Ox2. Patient's daughter reported that she is currently unable to care for patient given patient's current physical needs and fall risk. Patient's daughter expressed understanding of PT recommendation and is agreeable to SNF placement at time of discharge. Patient reports preference for an [place] facility. CSW discussed insurance authorization process and provided Medicare SNF ratings list along with the two [place] offers [facility] and [facility]) that will be able to transport to dialysis. Patient's daughter expressed being hopeful for rehab and for patient to feel better soon. No further questions reported at this time. CSW to continue to follow and assist with discharge planning needs.   Expected Discharge Plan: Skilled Nursing Facility Barriers to Discharge: Continued Medical Work up, Ship broker   Patient Goals and CMS Choice Patient states their goals for this hospitalization and ongoing recovery are:: Rehab CMS Medicare.gov Compare Post Acute Care list provided to:: Patient Represenative (must comment)([name]) Choice offered to / list presented to : Adult Children  Expected Discharge Plan and Services Expected Discharge Plan: [place] In-house Referral: Clinical Social Work   Post Acute Care Choice: [place] Living arrangements for the past 2 months: Apartment                                      Prior Living Arrangements/Services Living arrangements for the past 2 months: Apartment Lives with:: Self Patient language and  need for interpreter reviewed:: Yes        Need for Family Participation in Patient Care: Yes (Comment) Care giver support system in place?: Yes (comment) Current home services: Home OT Criminal Activity/Legal Involvement Pertinent to Current Situation/Hospitalization: No - Comment as needed  Activities of Daily Living Home Assistive Devices/Equipment: Gilford Rile (specify type) ADL Screening (condition at time of admission) Patient's cognitive ability adequate to safely complete daily activities?: No Is the patient deaf or have difficulty hearing?: No Does the patient have difficulty seeing, even when wearing glasses/contacts?: No Does the patient have difficulty concentrating, remembering, or making decisions?: Yes Patient able to express need for assistance with ADLs?: Yes Does the patient have difficulty dressing or bathing?: Yes Independently performs ADLs?: No Does the patient have difficulty walking or climbing stairs?: Yes Weakness of Legs: Both Weakness of Arms/Hands: None  Permission Sought/Granted      Share Information with NAME: [name]  Permission granted to share info w AGENCY: SNFs  Permission granted to share info w Relationship: Daughter  Permission granted to share info w Contact Information: [phone]  Emotional Assessment Appearance:: Appears stated age Attitude/Demeanor/Rapport: Unable to Assess Affect (typically observed): Unable to Assess Orientation: : Oriented to Self, Oriented to Place Alcohol / Substance Use: Not Applicable Psych Involvement: No (comment)  Admission diagnosis:  Rectal bleeding [K62.5] Tachycardia [R00.0] GI bleed [K92.2] History of duodenal ulcer [Z87.19] ESRD (end stage renal disease) on dialysis ([place]) [N18.6, Z99.2] Symptomatic anemia [D64.9] Patient Active Problem List   Diagnosis Date Noted  . Melena   .  History of duodenal ulcer   . Symptomatic anemia   . GI bleed 06/30/2019  . Cerebral embolism with cerebral infarction  06/19/2019  . Atrial fibrillation with rapid ventricular response ([place]) 06/14/2019  . Incomplete rectal prolapse 06/14/2019  . Respiratory failure with hypoxia ([place]) 05/10/2019  . Pneumonia 05/10/2019  . 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 Duodenal ulcer perforation ([place]) 04/23/2019  . Cocaine abuse ([place]) 04/23/2019  . Acute gastric ulcer without hemorrhage or perforation   . Hematemesis 04/04/2019  . Atrial fibrillation with RVR ([place])   . Duodenal ulcer with hemorrhage   . Gastritis and gastroduodenitis   . Acute blood loss anemia   . 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 RUQ pain   . Anemia due to chronic kidney disease, on chronic dialysis ([place])   . AF (paroxysmal atrial fibrillation) ([place])   . Chest pain 03/19/2019  . Hyperkalemia 02/28/2019  . Uremia 02/27/2019  . Volume overload 02/21/2019  . Abdominal pain 02/21/2019  . Non-compliance with renal dialysis ([place]) 02/21/2019  . Respiratory failure ([place]) 02/04/2019  . Acute and chr resp failure, unsp w hypoxia or hypercapnia ([place])   . Afib ([place]) 01/25/2019  . SBO (small bowel obstruction) ([place]) 08/10/2018  . Anemia due to chronic blood loss   . Heme positive stool   . Platelet inhibition due to Plavix   . Acute GI bleeding 05/06/2018  . ESRD (end stage renal disease) on dialysis ([place]) 05/06/2018  . Anxiety 05/06/2018  . Bipolar affective ([place]) 05/06/2018  . CAD (coronary artery disease), native coronary artery 05/06/2018  . 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 CVA (cerebral vascular accident) ([place]) 05/06/2018  . Essential hypertension 05/06/2018  . Hyperlipidemia 05/06/2018   PCP:  Maggie Schwalbe, PA-C Pharmacy:  No Pharmacies Listed    Social Determinants of Health (SDOH) Interventions    Readmission Risk Interventions Readmission Risk Prevention Plan 07/05/2019 06/17/2019 03/29/2019  Transportation Screening Complete Complete Complete  Medication Review Press photographer)  Complete Complete Complete  PCP or Specialist appointment within 3-5 days of discharge Complete - -  HRI or Home Care Consult Complete Complete Complete  SW Recovery Care/Counseling Consult Complete Complete Complete  Palliative Care Screening Not Applicable Not Applicable Not Applicable  Skilled Nursing Facility Complete (No Data) Not Applicable  Some recent data might be hidden

## 2019-07-05 NOTE — Anesthesia Postprocedure Evaluation (Signed)
Anesthesia Post Note  Patient: Christina [name]  Procedure(s) Performed: ESOPHAGOGASTRODUODENOSCOPY (EGD) WITH PROPOFOL (N/A ) BIOPSY     Patient location during evaluation: PACU Anesthesia Type: MAC Level of consciousness: awake and alert and oriented Pain management: pain level controlled Vital Signs Assessment: post-procedure vital signs reviewed and stable Respiratory status: spontaneous breathing, nonlabored ventilation and respiratory function stable Cardiovascular status: stable and blood pressure returned to baseline Postop Assessment: no apparent nausea or vomiting Anesthetic complications: no    Last Vitals:  Vitals:   07/05/19 0719 07/05/19 0937  BP: (!) 170/78 (!) 174/47  Pulse: 67 62  Resp: 12 12  Temp: 36.8 C 36.6 C  SpO2:  100%    Last Pain:  Vitals:   07/05/19 0937  TempSrc: Oral  PainSc: Asleep                 Stony Stegmann A.

## 2019-07-05 NOTE — Anesthesia Procedure Notes (Signed)
Procedure Name: MAC Date/Time: 07/05/2019 9:17 AM Performed by: Colin Benton, CRNA Pre-anesthesia Checklist: Patient identified, Emergency Drugs available, Suction available and Patient being monitored Patient Re-evaluated:Patient Re-evaluated prior to induction Oxygen Delivery Method: Nasal cannula Induction Type: IV induction Placement Confirmation: positive ETCO2 Dental Injury: Teeth and Oropharynx as per pre-operative assessment

## 2019-07-05 NOTE — Progress Notes (Signed)
Patient without PIV access with EGD scheduled for 0915. Discussed with Dr. Waldron Labs to hep lock HD cath or place EJ. IV team consult placed. IV team called and informed RN that EJ are placed in ED but they will be able to access HD Cath with permission from nephrologist. Alric Seton, PA paged and gave permission. Orders placed and IV team order placed & called to notify.

## 2019-07-05 NOTE — Progress Notes (Addendum)
Patient's BP 168/59. Hydralazine administered last at 1214; not time for next PRN dose as it's q6. Dr. Waldron Labs paged. Verbal order received to administer another 5mg  IV Hydralazine now. Will continue to monitor.  Addendum: repeat BP 144/57.

## 2019-07-05 NOTE — Anesthesia Preprocedure Evaluation (Addendum)
Anesthesia Evaluation  Patient identified by MRN, date of birth, ID band Patient awake    Reviewed: Allergy & Precautions, H&P , NPO status , Patient's Chart, lab work & pertinent test results  Airway Mallampati: II  TM Distance: >3 FB Neck ROM: full    Dental  (+) Edentulous Upper, Poor Dentition, Missing, Caps,    Pulmonary asthma , pneumonia, resolved, COPD, former smoker,    breath sounds clear to auscultation       Cardiovascular hypertension, + CAD and + Peripheral Vascular Disease  + dysrhythmias Atrial Fibrillation  Rhythm:regular Rate:Normal     Neuro/Psych  Headaches, PSYCHIATRIC DISORDERS Anxiety Depression Bipolar Disorder CVA    GI/Hepatic PUD, GERD  Medicated,Melena   Endo/Other    Renal/GU ESRF and DialysisRenal disease  negative genitourinary   Musculoskeletal  (+) Arthritis , Osteoarthritis,    Abdominal   Peds  Hematology  (+) Blood dyscrasia, anemia ,   Anesthesia Other Findings   Reproductive/Obstetrics                            Anesthesia Physical  Anesthesia Plan  ASA: III  Anesthesia Plan: MAC   Post-op Pain Management:    Induction: Intravenous  PONV Risk Score and Plan: 2 and Propofol infusion, Treatment may vary due to age or medical condition and Ondansetron  Airway Management Planned: Nasal Cannula and Natural Airway  Additional Equipment:   Intra-op Plan:   Post-operative Plan:   Informed Consent: I have reviewed the patients History and Physical, chart, labs and discussed the procedure including the risks, benefits and alternatives for the proposed anesthesia with the patient or authorized representative who has indicated his/her understanding and acceptance.     Dental advisory given  Plan Discussed with: CRNA and Anesthesiologist  Anesthesia Plan Comments:         Anesthesia Quick Evaluation

## 2019-07-05 NOTE — Progress Notes (Signed)
PROGRESS NOTE                                                                                                                                                                                                             Patient Demographics:    Christina [name], is a 69 y.o. female, DOB - 05-18-1950, LOV:[id number]  Admit date - 06/29/2019   Admitting Physician Norval Morton, MD  Outpatient Primary MD for the patient is Nodal, [name], PA-C  LOS - 5   Chief Complaint  Patient presents with  . Rectal Problems       Brief Narrative    Christina [name] is a 69 y.o. female with medical history significant of HTN, HLD, CAD, A. fib, COPD, ESRD on HD(TTS), SBO, rectal prolapse, and polysubstance abuse including cocaine who initially presented with complaints of rectal pain progressively worsening over the last 4 days.    Patient missed her dialysis due to pain, She is followed by Dr. Oneida Alar of vascular surgery appears to have been contemplating amputating her toes.  Patient admits that she has had several bloody bowel movements while she has been here in the hospital.  She adamantly denies any recent cocaine use.  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  Hospitalized 4/13-4/15 with acute respiratory failure secondary to volume overload and COPD exacerbation.  4/25-4/27 at [facility] for with A. fib and AVR no longer on anticoagulation due to large duodenal ulcer.  5/8-5/9 hospitalized for peripheral artery disease and gangrene of left foot for which she was discharged to follow-up with Dr. Oneida Alar.  Lastly hospitalized from 5/18-5/24 with GI bleed found to have several deep duodenal ulcers without active signs of bleeding. Patient was noted to be hypotensive on  admission 76/52, hemoglobin which has responded to PRBC transfusion, but she kept to having continuous melena during hospital stay, where she was seen by GI, treated with tonics, her hemoglobin has stabilized after 2 units PRBC, plan for endoscopy per GI, and arteriogram by vascular surgery.    Subjective:    Christina [name] today she denies any complaints today, denies any dizziness, lightheadedness, shortness of breath or chest pain .  Assessment  & Plan :    Active Problems:   ESRD (end stage renal disease) on dialysis (HCC)   Duodenal ulcer with hemorrhage   GI bleed   History of duodenal ulcer   Symptomatic anemia   Melena   GI bleed, acute blood loss anemia:  -This is in the setting of her GI bleed, as endoscopy today significant for duodenal ulcers, and some esophageal plaques, biopsies are pending -She was transfused 3 units PRBC, hemoglobin has responded appropriately, but it is trending down, she was transfused another unit yesterday, with good response, globin has been stable since. -Continue to hold aspirin and Plavix.  It can be resumed in 3 days per GI if indicated, likely she will need antiplatelet therapy pending her further vascular surgery evaluation. -Continue with PPI -Diet has been advanced by GI.  Peripheral vascular disease with ischemic toes without gangrene -vascular surgery input greatly appreciated, plan for arteriogram, no endoscopy has then will await on further vascular surgery recommendation about timing, aspirin and Plavix are held, they can be resumed in 3 days, will await further vascular surgery recommendation before decision about which antiplatelet resume.  Transient hypotension:  -Resolved, secondary to acute blood loss anemia .  Hospital delirium -Patient she had significant episodes of hospital delirium which she required IM Haldol, mentation appears to be back to baseline this morning.  ESRD on HD:  - renal consulted   Rectal  prolapse: Acute on chronic -Lidocaine jelly as needed   Paroxysmal atrial fibrillation:  -  Not on anticoagulation due to history of GI bleeds. -Continue amiodarone and metoprolol  COPD, without acute exacerbation. -Continue pharmacy substitution of Dulera and duo nebs as needed for shortness of breath/wheezing  Polysubstance abuse:  -Urine drug screen is negative this admission  Hyperlipidemia -Continue pravastatin   COVID-19 Labs  No results for input(s): DDIMER, FERRITIN, LDH, CRP in the last 72 hours.  Lab Results  Component Value Date   SARSCOV2NAA NEGATIVE 06/29/2019   [place] NEGATIVE 06/14/2019   [place] NEGATIVE 05/10/2019   [place] NEGATIVE 04/23/2019     Code Status : DNR  Family Communication  : None at bedside  Disposition Plan  :  Status is: Inpatient  Remains inpatient appropriate because:Hemodynamically unstable   Dispo: The patient is from: Home              Anticipated d/c is to: SNF              Anticipated d/c date is: > 3 days              Patient currently is not medically stable to d/c.    Consults  :  Renal,GI,Vascular surgery  Procedures  : Endoscopy 6/8  DVT Prophylaxis  :  SCD  Lab Results  Component Value Date   PLT 243 07/05/2019    Antibiotics  :    Anti-infectives (From admission, onward)   None        Objective:   Vitals:   07/05/19 0956 07/05/19 1019 07/05/19 1204 07/05/19 1300  BP: (!) 194/65 (!) 178/69 (!) 165/68 (!) 146/75  Pulse:   65   Resp: 12 14 13 17   Temp:  97.8 F (36.6 C) 98 F (36.7 C)   TempSrc:  Oral Oral   SpO2: 96% 95% 98%   Weight:      Height:        Wt Readings from Last 3 Encounters:  07/04/19 49.2 kg  06/20/19 50.8 kg  05/12/19  41.9 kg     Intake/Output Summary (Last 24 hours) at 07/05/2019 1453 Last data filed at 07/05/2019 0093 Gross per 24 hour  Intake 200 ml  Output 3299 ml  Net -3099 ml     Physical Exam  Awake Alert, Oriented X 3, No new F.N  deficits, Normal affect Symmetrical Chest wall movement, Good air movement bilaterally, CTAB RRR,No Gallops,Rubs or new Murmurs, No Parasternal Heave +ve B.Sounds, Abd Soft, No tenderness, No rebound - guarding or rigidity. Patient with ischemic changes of her toes bilaterally, without cellulitis or drainage.      Data Review:    CBC Recent Labs  Lab 06/29/19 1610 06/30/19 0457 07/01/19 0617 07/01/19 1549 07/01/19 2358 07/01/19 2358 07/02/19 0719 07/02/19 2335 07/03/19 1145 07/04/19 1023 07/05/19 0601  WBC 6.3   < > 6.3  --  7.3  --   --   --  6.9 8.0 7.5  HGB 6.8*   < > 7.9*   < > 9.0*   < > 8.6* 9.8* 9.7* 10.2* 10.1*  HCT 22.5*   < > 23.8*   < > 27.1*   < > 26.4* 30.7* 30.2* 32.2* 31.0*  PLT 261   < > 177  --  178  --   --   --  217 281 243  MCV 94.1   < > 89.8  --  89.7  --   --   --  92.1 94.4 93.1  MCH 28.5   < > 29.8  --  29.8  --   --   --  29.6 29.9 30.3  MCHC 30.2   < > 33.2  --  33.2  --   --   --  32.1 31.7 32.6  RDW 17.6*   < > 15.7*  --  15.1  --   --   --  15.4 15.9* 16.1*  LYMPHSABS 0.9  --   --   --   --   --   --   --   --   --   --   MONOABS 0.5  --   --   --   --   --   --   --   --   --   --   EOSABS 0.1  --   --   --   --   --   --   --   --   --   --   BASOSABS 0.1  --   --   --   --   --   --   --   --   --   --    < > = values in this interval not displayed.    Chemistries  Recent Labs  Lab 06/29/19 1610 06/30/19 0456 07/01/19 0617 07/02/19 0719 07/04/19 1023  NA 138 137 136 133* 136  K 5.0 5.5* 4.3 5.2* 4.0  CL 103 108 101 99 102  CO2 17* 18* 23 20* 24  GLUCOSE 101* 99 85 110* 125*  BUN 91* 110* 52* 64* 44*  CREATININE 7.39* 7.25* 4.18* 5.19* 5.05*  CALCIUM 8.1* 7.7* 7.6* 8.0* 8.6*  AST 15  --   --   --   --   ALT 11  --   --   --   --   ALKPHOS 43  --   --   --   --   BILITOT 0.7  --   --   --   --    ------------------------------------------------------------------------------------------------------------------  No results for  input(s): CHOL, HDL, LDLCALC, TRIG, CHOLHDL, LDLDIRECT in the last 72 hours.  Lab Results  Component Value Date   HGBA1C 4.9 06/19/2019   ------------------------------------------------------------------------------------------------------------------ No results for input(s): TSH, T4TOTAL, T3FREE, THYROIDAB in the last 72 hours.  Invalid input(s): FREET3 ------------------------------------------------------------------------------------------------------------------ No results for input(s): VITAMINB12, FOLATE, FERRITIN, TIBC, IRON, RETICCTPCT in the last 72 hours.  Coagulation profile No results for input(s): INR, PROTIME in the last 168 hours.  No results for input(s): DDIMER in the last 72 hours.  Cardiac Enzymes No results for input(s): CKMB, TROPONINI, MYOGLOBIN in the last 168 hours.  Invalid input(s): CK ------------------------------------------------------------------------------------------------------------------    Component Value Date/Time   BNP 2,007.2 (H) 03/25/2019 1313    Inpatient Medications  Scheduled Meds: . allopurinol  100 mg Oral Daily  . amiodarone  200 mg Oral BID  . calcitRIOL  1 mcg Oral Q M,W,F-HD  . [START ON 07/06/2019] Chlorhexidine Gluconate Cloth  6 each Topical Q0600  . cholecalciferol  2,000 Units Oral Daily  . darbepoetin (ARANESP) injection - DIALYSIS  200 mcg Intravenous Q Sat-HD  . dicyclomine  10 mg Oral QHS  . docusate sodium  100 mg Oral BID  . feeding supplement (NEPRO CARB STEADY)  237 mL Oral BID BM  . ferric citrate  630 mg Oral TID WC  . haloperidol lactate  4 mg Intravenous Once  . isosorbide mononitrate  30 mg Oral QHS  . LORazepam  1 mg Intravenous Once  . metoprolol tartrate  25 mg Oral BID  . mometasone-formoterol  2 puff Inhalation BID  . pantoprazole  40 mg Intravenous Q12H  . pravastatin  80 mg Oral QPM  . sodium chloride flush  3 mL Intravenous Q12H  . sucralfate  1 g Oral Q6H   Continuous Infusions: . ferric  gluconate (FERRLECIT/NULECIT) IV Stopped (07/04/19 1614)   PRN Meds:.acetaminophen **OR** acetaminophen, clobetasol cream, diphenhydrAMINE, hydrALAZINE, hydrALAZINE, ipratropium-albuterol, nitroGLYCERIN, ondansetron, oxyCODONE-acetaminophen, pentafluoroprop-tetrafluoroeth, polyethylene glycol, senna-docusate, sodium chloride flush  Micro Results Recent Results (from the past 240 hour(s))  SARS Coronavirus 2 by RT PCR (hospital order, performed in [place] hospital lab) Nasopharyngeal Nasopharyngeal Swab     Status: None   Collection Time: 06/29/19  5:01 PM   Specimen: Nasopharyngeal Swab  Result Value Ref Range Status   SARS Coronavirus 2 NEGATIVE NEGATIVE Final    Comment: (NOTE) SARS-CoV-2 target nucleic acids are NOT DETECTED. The SARS-CoV-2 RNA is generally detectable in upper and lower respiratory specimens during the acute phase of infection. The lowest concentration of SARS-CoV-2 viral copies this assay can detect is 250 copies / mL. A negative result does not preclude SARS-CoV-2 infection and should not be used as the sole basis for treatment or other patient management decisions.  A negative result may occur with improper specimen collection / handling, submission of specimen other than nasopharyngeal swab, presence of viral mutation(s) within the areas targeted by this assay, and inadequate number of viral copies (<250 copies / mL). A negative result must be combined with clinical observations, patient history, and epidemiological information. Fact Sheet for Patients:   StrictlyIdeas.no Fact Sheet for Healthcare Providers: BankingDealers.co.za This test is not yet approved or cleared  by the [place] FDA and has been authorized for detection and/or diagnosis of SARS-CoV-2 by FDA under an Emergency Use Authorization (EUA).  This EUA will remain in effect (meaning this test can be used) for the duration of the COVID-19  declaration under Section 564(b)(1) of the Act, 21 U.S.C. section  360bbb-3(b)(1), unless the authorization is terminated or revoked sooner. Performed at [facility], [place] [address], [place], [place] [address]   MRSA PCR Screening     Status: None   Collection Time: 06/30/19  5:37 PM   Specimen: Nasopharyngeal  Result Value Ref Range Status   MRSA by PCR NEGATIVE NEGATIVE Final    Comment:        The GeneXpert MRSA Assay (FDA approved for NASAL specimens only), is one component of a comprehensive MRSA colonization surveillance program. It is not intended to diagnose MRSA infection nor to guide or monitor treatment for MRSA infections. Performed at [facility], [place] [address] [place], [place] [address]     Radiology Reports CT ANGIO HEAD W OR WO CONTRAST  Result Date: 06/19/2019 CLINICAL DATA:  69 year old female dialysis patient with encephalopathy, confusion after dialysis. Recent MRI demonstrating a small chronic left occipital cortical infarct, nearby chronic left occipital pole encephalomalacia, underlying advanced chronic ischemic disease. EXAM: CT ANGIOGRAPHY HEAD AND NECK TECHNIQUE: Multidetector CT imaging of the head and neck was performed using the standard protocol during bolus administration of intravenous contrast. Multiplanar CT image reconstructions and MIPs were obtained to evaluate the vascular anatomy. Carotid stenosis measurements (when applicable) are obtained utilizing NASCET criteria, using the distal internal carotid diameter as the denominator. CONTRAST:  69mL OMNIPAQUE IOHEXOL 350 MG/ML SOLN COMPARISON:  Brain MRI and head CT 06/17/2019. Neck CT without contrast 07/11/2016 FINDINGS: CT HEAD Brain: Subtle cortical hypodensity now on the left lateral occiput corresponding to the DWI lesion (series 5, image 14). No acute intracranial hemorrhage or mass effect. Otherwise Stable non contrast CT appearance of the brain. Calvarium and skull base: No acute  osseous abnormality identified. Paranasal sinuses: Visualized paranasal sinuses and mastoids are stable and well pneumatized. Orbits: Stable orbit and scalp soft tissues. CTA NECK Skeleton: Absent maxillary dentition. Advanced cervical spine degeneration with reversal of lordosis. Chronic deformity posterior right 1st and 2nd ribs. No acute osseous abnormality identified. Upper chest: Small layering pleural effusions are partially visible. Mild mosaic pulmonary attenuation. Visible precarinal and AP window lymph nodes are at the upper limits of normal. Right IJ approach dual lumen dialysis catheter. Other neck: No acute findings.  No cervical lymphadenopathy. Aortic arch: Severe atherosclerosis of the visible aorta including bulky mural plaque or thrombus throughout the arch (series 12, images 80 through 134). Three vessel arch configuration. Right carotid system: No brachiocephalic artery or right CCA origin stenosis despite plaque. Tortuous proximal right CCA. Intermittent plaque to the bifurcation without stenosis. Less than 50 % stenosis with respect to the distal vessel of the proximal right ICA despite plaque. Left carotid system: Prominent plaque at the left CCA origin and along the ventral vessel at the thoracic inlet but less than 50% stenosis (series 9, image 132). At the level of the thyroid combined soft and calcified plaque results in 60% stenosis (series 9, image 122). Capacious left carotid bifurcation with surgical clips compatible with previous endarterectomy. Tortuous left ICA distal to the bulb without stenosis to the skull base. Vertebral arteries: Proximal right subclavian soft and calcified plaque similar to the other great vessels. Moderate stenosis right vertebral artery origin best seen on series 12. Tortuous right V1 segment. No additional right vertebral stenosis to the skull base. Widespread proximal left subclavian artery plaque similar to the other great vessels without significant  stenosis. Calcified plaque at the left vertebral artery origin, moderate stenosis best seen on series 11, image 262. Tortuous  left V1 segment. Codominant left vertebral artery otherwise patent to the skull base. CTA HEAD Posterior circulation: Bilateral V4 segment soft and calcified plaque. No significant stenosis on the left but moderate to severe right V4 stenosis just distal to PICA on series 12, image 113. Normal left PICA. Patent vertebrobasilar junction. Patent basilar artery with only mild irregularity. Normal SCA and PCA origins. Posterior communicating arteries are diminutive or absent. The left PCA P3 superior division is occluded, although there is chronic encephalomalacia in the left PCA territory also. On the right there is mild to moderate P1 and P2 irregularity and stenosis. Anterior circulation: Both ICA siphons are patent. Heavy calcification through the left cavernous segment and anterior genu with mild to moderate stenosis. Similar right siphon calcified plaque with at least moderate stenosis at the anterior genu. Patent carotid termini. Normal MCA and ACA origins. Anterior communicating artery and bilateral ACA branches are within normal limits. Left MCA M1 segment and bifurcation are patent without stenosis. Right MCA M1 segment and bifurcation are patent without stenosis. Bilateral MCA branches are within normal limits. Venous sinuses: Early contrast timing but grossly patent. Anatomic variants: None. Review of the MIP images confirms the above findings IMPRESSION: 1. Negative for large vessel occlusion. 2. Positive for bulky Mural Soft Plaque versus Thrombus throughout the Aortic Arch. See series 12 images 80-134. Aortic Atherosclerosis (ICD10-I70.0). 3. Positive for occluded Left PCA superior P3 branch, although age indeterminate in light of some chronic left PCA territory encephalomalacia. 4. Positive also for significant atherosclerotic stenoses: - Left CCA 60% stenosis at the level of the  thyroid. - moderate bilateral vertebral artery origin stenosis, and up to Severe Right Vertebral V4 segment stenosis distal to PICA. - bilateral ICA siphon stenosis, up to moderate on the Left and Moderate To Severe at the Right ICA anterior genu. - moderate Right PCA P2 stenosis. 5.  Stable non contrast CT appearance of the brain. 6. Small bilateral pleural effusions. Advanced cervical spine degeneration. Electronically Signed   By: Genevie Ann M.D.   On: 06/19/2019 13:25   DG Chest 1 View  Result Date: 06/17/2019 CLINICAL DATA:  Hematemesis, history small bowel obstruction, hypertension, coronary artery disease post stenting, COPD, end-stage renal disease on hemodialysis, anemia secondary to GI bleed EXAM: CHEST  1 VIEW COMPARISON:  Portable exam 1506 hours compared to 06/14/2019 FINDINGS: RIGHT jugulars dialysis catheter with tip projecting over RIGHT atrium. Enlargement of cardiac silhouette with slight pulmonary vascular congestion. Atherosclerotic calcification aorta. Minimal perihilar to basilar pulmonary infiltrates consistent with pulmonary edema. No pleural effusion or pneumothorax. Bones demineralized. IMPRESSION: Enlargement of cardiac silhouette with pulmonary vascular congestion and minimal pulmonary edema. Electronically Signed   By: Lavonia Dana M.D.   On: 06/17/2019 15:26   CT HEAD WO CONTRAST  Result Date: 06/17/2019 CLINICAL DATA:  Delirium/confusion after dialysis EXAM: CT HEAD WITHOUT CONTRAST TECHNIQUE: Contiguous axial images were obtained from the base of the skull through the vertex without intravenous contrast. COMPARISON:  July 17, 2018 maxillofacial CT covering much of the brain parenchyma FINDINGS: Brain: There is mild diffuse atrophy. There is no intracranial mass, hemorrhage, extra-axial fluid collection, or midline shift. There is widespread small vessel disease throughout the centra semiovale bilaterally. There is a prior appearing infarct in the superior mid left frontal lobe  with involvement of both gray and white matter in this area. There is a prior appearing infarct in the posterosuperior left parietal lobe at the gray-white junction. There is evidence of a prior infarct in  the medial right parietooccipital junction region. There is a prior infarct in the head of the caudate nucleus on the right. There is an age uncertain infarct in the left thalamus which has somewhat indistinct borders suggesting that this infarct may be recent/acute. There are small lacunar type infarcts in each cerebellar hemisphere, more on the right than on the left. Vascular: No hyperdense vessel. There are foci of calcification in each carotid siphon and distal vertebral artery region. Skull: The bony calvarium a appears intact. Sinuses/Orbits: Paranasal sinuses are clear. There is rightward deviation of the nasal septum. Orbits appear symmetric bilaterally. Other: Mastoid air cells are clear. There is debris in each external auditory canal. Brain: It IMPRESSION: Mild atrophy. Extensive supratentorial small vessel disease. 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 And head of caudate nucleus on the right. Suspect more recent and potentially acute focal infarct in the left thalamus. Lacunar type infarcts noted in the cerebellar hemispheres bilaterally, more on the right than on the left. No mass or hemorrhage. Foci of arterial vascular calcification noted at multiple levels. Probable cerumen in each external auditory canal. Deviated nasal septum. Electronically Signed   By: Lowella Grip III M.D.   On: 06/17/2019 16:19   CT ANGIO NECK W OR WO CONTRAST  Result Date: 06/19/2019 CLINICAL DATA:  69 year old female dialysis patient with encephalopathy, confusion after dialysis. Recent MRI demonstrating a small chronic left occipital cortical infarct, nearby chronic left occipital pole  encephalomalacia, underlying advanced chronic ischemic disease. EXAM: CT ANGIOGRAPHY HEAD AND NECK TECHNIQUE: Multidetector CT imaging of the head and neck was performed using the standard protocol during bolus administration of intravenous contrast. Multiplanar CT image reconstructions and MIPs were obtained to evaluate the vascular anatomy. Carotid stenosis measurements (when applicable) are obtained utilizing NASCET criteria, using the distal internal carotid diameter as the denominator. CONTRAST:  57mL OMNIPAQUE IOHEXOL 350 MG/ML SOLN COMPARISON:  Brain MRI and head CT 06/17/2019. Neck CT without contrast 07/11/2016 FINDINGS: CT HEAD Brain: Subtle cortical hypodensity now on the left lateral occiput corresponding to the DWI lesion (series 5, image 14). No acute intracranial hemorrhage or mass effect. Otherwise Stable non contrast CT appearance of the brain. Calvarium and skull base: No acute osseous abnormality identified. Paranasal sinuses: Visualized paranasal sinuses and mastoids are stable and well pneumatized. Orbits: Stable orbit and scalp soft tissues. CTA NECK Skeleton: Absent maxillary dentition. Advanced cervical spine degeneration with reversal of lordosis. Chronic deformity posterior right 1st and 2nd ribs. No acute osseous abnormality identified. Upper chest: Small layering pleural effusions are partially visible. Mild mosaic pulmonary attenuation. Visible precarinal and AP window lymph nodes are at the upper limits of normal. Right IJ approach dual lumen dialysis catheter. Other neck: No acute findings.  No cervical lymphadenopathy. Aortic arch: Severe atherosclerosis of the visible aorta including bulky mural plaque or thrombus throughout the arch (series 12, images 80 through 134). Three vessel arch configuration. Right carotid system: No brachiocephalic artery or right CCA origin stenosis despite plaque. Tortuous proximal right CCA. Intermittent plaque to the bifurcation without stenosis. Less  than 50 % stenosis with respect to the distal vessel of the proximal right ICA despite plaque. Left carotid system: Prominent plaque at the left CCA origin and along the ventral vessel at the thoracic inlet but less than 50% stenosis (series 9, image 132). At the level of the thyroid combined soft and calcified plaque results in 60% stenosis (series 9, image  122). Capacious left carotid bifurcation with surgical clips compatible with previous endarterectomy. Tortuous left ICA distal to the bulb without stenosis to the skull base. Vertebral arteries: Proximal right subclavian soft and calcified plaque similar to the other great vessels. Moderate stenosis right vertebral artery origin best seen on series 12. Tortuous right V1 segment. No additional right vertebral stenosis to the skull base. Widespread proximal left subclavian artery plaque similar to the other great vessels without significant stenosis. Calcified plaque at the left vertebral artery origin, moderate stenosis best seen on series 11, image 262. Tortuous left V1 segment. Codominant left vertebral artery otherwise patent to the skull base. CTA HEAD Posterior circulation: Bilateral V4 segment soft and calcified plaque. No significant stenosis on the left but moderate to severe right V4 stenosis just distal to PICA on series 12, image 113. Normal left PICA. Patent vertebrobasilar junction. Patent basilar artery with only mild irregularity. Normal SCA and PCA origins. Posterior communicating arteries are diminutive or absent. The left PCA P3 superior division is occluded, although there is chronic encephalomalacia in the left PCA territory also. On the right there is mild to moderate P1 and P2 irregularity and stenosis. Anterior circulation: Both ICA siphons are patent. Heavy calcification through the left cavernous segment and anterior genu with mild to moderate stenosis. Similar right siphon calcified plaque with at least moderate stenosis at the anterior  genu. Patent carotid termini. Normal MCA and ACA origins. Anterior communicating artery and bilateral ACA branches are within normal limits. Left MCA M1 segment and bifurcation are patent without stenosis. Right MCA M1 segment and bifurcation are patent without stenosis. Bilateral MCA branches are within normal limits. Venous sinuses: Early contrast timing but grossly patent. Anatomic variants: None. Review of the MIP images confirms the above findings IMPRESSION: 1. Negative for large vessel occlusion. 2. Positive for bulky Mural Soft Plaque versus Thrombus throughout the Aortic Arch. See series 12 images 80-134. Aortic Atherosclerosis (ICD10-I70.0). 3. Positive for occluded Left PCA superior P3 branch, although age indeterminate in light of some chronic left PCA territory encephalomalacia. 4. Positive also for significant atherosclerotic stenoses: - Left CCA 60% stenosis at the level of the thyroid. - moderate bilateral vertebral artery origin stenosis, and up to Severe Right Vertebral V4 segment stenosis distal to PICA. - bilateral ICA siphon stenosis, up to moderate on the Left and Moderate To Severe at the Right ICA anterior genu. - moderate Right PCA P2 stenosis. 5.  Stable non contrast CT appearance of the brain. 6. Small bilateral pleural effusions. Advanced cervical spine degeneration. Electronically Signed   By: Genevie Ann M.D.   On: 06/19/2019 13:25   MR BRAIN WO CONTRAST  Result Date: 06/17/2019 CLINICAL DATA:  69 year old female with encephalopathy. Confusion after dialysis. EXAM: MRI HEAD WITHOUT CONTRAST TECHNIQUE: Multiplanar, multiecho pulse sequences of the brain and surrounding structures were obtained without intravenous contrast. COMPARISON:  Head CT without contrast earlier today. FINDINGS: The examination had to be discontinued prior to completion by patient request. Diffusion-weighted imaging, axial T2 imaging, and moderately degraded sagittal T1 imaging only was obtained. Major intracranial  vascular flow voids are preserved. There is a tiny focus of restricted diffusion in the cortex of the left lateral occipital lobe. No other convincing restricted diffusion. See series 5, image 69. Nearby chronic cortical encephalomalacia in the left occipital pole. There are scattered small chronic infarcts in both cerebellar hemispheres, in the bilateral basal ganglia. There are chronic cortical infarcts in the bilateral MCA territories, and right posterior MCA/PCA watershed  with cortical encephalomalacia. Superimposed confluent bilateral cerebral white matter T2 hyperintensity. No midline shift, mass effect, evidence of mass lesion, ventriculomegaly, extra-axial collection or acute intracranial hemorrhage is evident. Paranasal Visualized paranasal sinuses and mastoids are stable and well pneumatized. Visible internal auditory structures appear normal. Mildly asymmetric size of the right superior ophthalmic vein, but otherwise negative orbits. Unremarkable noncontrast visible cavernous sinus. IMPRESSION: 1. Truncated exam by patient request. 2. Positive for a tiny acute cortical infarct in the left occipital lobe, located near a chronic left PCA infarct. 3. Underlying advanced chronic ischemic disease in the bilateral cerebellum and bilateral MCA territories. Electronically Signed   By: Genevie Ann M.D.   On: 06/17/2019 19:10   CT ABDOMEN PELVIS W CONTRAST  Result Date: 06/14/2019 CLINICAL DATA:  Vomiting and diarrhea beginning this morning. Abdominal pain and tenderness with palpation. EXAM: CT ABDOMEN AND PELVIS WITH CONTRAST TECHNIQUE: Multidetector CT imaging of the abdomen and pelvis was performed using the standard protocol following bolus administration of intravenous contrast. CONTRAST:  64mL OMNIPAQUE IOHEXOL 300 MG/ML  SOLN COMPARISON:  04/23/2019 FINDINGS: Lower Chest: New small bilateral pleural effusions and dependent bibasilar atelectasis. Stable cardiomegaly. Hepatobiliary: No hepatic masses  identified. Gallbladder is unremarkable. No evidence of biliary ductal dilatation. Pancreas:  No mass or inflammatory changes. Spleen: Within normal limits in size and appearance. Adrenals/Urinary Tract: Stable bilateral adrenal masses, consistent with benign adenomas. Diffuse bilateral renal atrophy and tiny bilateral renal cysts remain stable. No evidence of ureteral calculi or hydronephrosis. Unremarkable unopacified urinary bladder. Stomach/Bowel: Diffuse duodenal wall thickening and mucosal enhancement has increased since previous study, consistent with duodenitis. A small postbulbar duodenal ulcer is seen and decreased in size since previous study. No evidence of free intraperitoneal air. Vascular/Lymphatic: No pathologically enlarged lymph nodes. No abdominal aortic aneurysm. Aortic atherosclerosis noted. Reproductive: Prior hysterectomy noted. Adnexal regions are unremarkable in appearance. Other: Several small midline epigastric ventral hernias are again seen which contain only fat. No evidence of herniated bowel loops. Increased diffuse body wall edema since prior exam. Musculoskeletal:  No suspicious bone lesions identified. IMPRESSION: 1. Increased diffuse duodenal wall thickening and mucosal enhancement, consistent with duodenitis. 2. Decreased size of small postbulbar duodenal ulcer. 3. New small bilateral pleural effusions diffuse body wall edema. 4. Stable small epigastric ventral hernias containing only fat. 5. Stable benign bilateral adrenal adenomas. Aortic Atherosclerosis (ICD10-I70.0). Electronically Signed   By: Marlaine Hind M.D.   On: 06/14/2019 15:21   DG CHEST PORT 1 VIEW  Result Date: 06/30/2019 CLINICAL DATA:  GI bleed EXAM: PORTABLE CHEST 1 VIEW COMPARISON:  06/19/2019 FINDINGS: Right-sided hemodialysis catheter terminates at the level of the right atrium. Stable mild cardiomegaly. Atherosclerotic calcification of the aortic knob. Mild diffuse interstitial markings suggesting mild  edema. No focal airspace consolidation. No pleural effusion or pneumothorax. IMPRESSION: Mildly prominent diffuse interstitial markings suggesting mild edema. Electronically Signed   By: Davina Poke D.O.   On: 06/30/2019 16:14   DG CHEST PORT 1 VIEW  Result Date: 06/19/2019 CLINICAL DATA:  End-stage renal disease.  Dialysis. EXAM: PORTABLE CHEST 1 VIEW COMPARISON:  Jun 17, 2019 FINDINGS: The dialysis catheter terminates near the caval atrial junction. No pneumothorax. No overt edema. No nodules or masses. No focal infiltrates. There may be a small left effusion. The cardiomediastinal silhouette is stable. IMPRESSION: No overt edema. Stable dialysis catheter. There may be a small left effusion. Electronically Signed   By: Dorise Bullion III M.D   On: 06/19/2019 13:30   DG Chest [facility]  1 View  Result Date: 06/14/2019 CLINICAL DATA:  Weakness. Dialysis patient. EXAM: PORTABLE CHEST 1 VIEW COMPARISON:  Chest x-ray 05/22/2019 FINDINGS: The right IJ dialysis catheter is in good position, unchanged. The cardiac silhouette, mediastinal and hilar contours are within normal limits. Mild tortuosity and calcification of the thoracic aorta. No acute pulmonary findings. No pleural effusions. No pulmonary lesions. IMPRESSION: No acute cardiopulmonary findings. Electronically Signed   By: Marijo Sanes M.D.   On: 06/14/2019 10:49   ECHOCARDIOGRAM COMPLETE  Result Date: 06/20/2019    ECHOCARDIOGRAM REPORT   Patient Name:   JOHNASIA LIESE Date of Exam: 06/20/2019 Medical Rec #:  [id number]          Height:       62.0 in Accession #:    [id number]         Weight:       112.0 lb Date of Birth:  12/18/1950           BSA:          1.494 m Patient Age:    68 years           BP:           157/59 mmHg Patient Gender: F                  HR:           82 bpm. Exam Location:  Inpatient Procedure: 2D Echo, Color Doppler and Cardiac Doppler Indications:    Stroke 434.91/ I163.9  History:        Patient has prior history of  Echocardiogram examinations, most                 recent 01/25/2019. CAD, COPD, Arrythmias:Atrial Fibrillation;                 Risk Factors:Hypertension, Dyslipidemia and GERD. CKD.  Sonographer:    Jonelle Sidle Dance Referring Phys: [id number] ASHISH ARORA IMPRESSIONS  1. Left ventricular ejection fraction, by estimation, is 65 to 70%. The left ventricle has normal function. The left ventricle has no regional wall motion abnormalities. Left ventricular diastolic parameters are consistent with Grade II diastolic dysfunction (pseudonormalization).  2. Right ventricular systolic function is normal. The right ventricular size is normal. There is moderately elevated pulmonary artery systolic pressure. The estimated right ventricular systolic pressure is 52.7 mmHg.  3. Left atrial size was mild to moderately dilated.  4. Right atrial size was mildly dilated.  5. The mitral valve is normal in structure. Moderate mitral valve regurgitation. No evidence of mitral stenosis.  6. Tricuspid valve regurgitation is moderate.  7. The aortic valve is normal in structure. Aortic valve regurgitation is not visualized. No aortic stenosis is present. FINDINGS  Left Ventricle: Left ventricular ejection fraction, by estimation, is 65 to 70%. The left ventricle has normal function. The left ventricle has no regional wall motion abnormalities. The left ventricular internal cavity size was normal in size. There is  no left ventricular hypertrophy. Left ventricular diastolic parameters are consistent with Grade II diastolic dysfunction (pseudonormalization). Right Ventricle: The right ventricular size is normal. No increase in right ventricular wall thickness. Right ventricular systolic function is normal. There is moderately elevated pulmonary artery systolic pressure. The tricuspid regurgitant velocity is 3.35 m/s, and with an assumed right atrial pressure of 8 mmHg, the estimated right ventricular systolic pressure is 78.2 mmHg. Left Atrium:  Left atrial size was mild to moderately dilated. Right Atrium: Right atrial size was  mildly dilated. Pericardium: There is no evidence of pericardial effusion. Mitral Valve: The mitral valve is normal in structure. Moderate mitral valve regurgitation. No evidence of mitral valve stenosis. Tricuspid Valve: The tricuspid valve is grossly normal. Tricuspid valve regurgitation is moderate . No evidence of tricuspid stenosis. Aortic Valve: The aortic valve is normal in structure. Aortic valve regurgitation is not visualized. Aortic regurgitation PHT measures 378 msec. No aortic stenosis is present. Pulmonic Valve: The pulmonic valve was normal in structure. Pulmonic valve regurgitation is trivial. Aorta: The aortic root and ascending aorta are structurally normal, with no evidence of dilitation. IAS/Shunts: The atrial septum is grossly normal.  LEFT VENTRICLE PLAX 2D LVIDd:         4.00 cm  Diastology LVIDs:         2.36 cm  LV e' lateral:   6.90 cm/s LV PW:         0.96 cm  LV E/e' lateral: 21.7 LV IVS:        0.91 cm  LV e' medial:    5.10 cm/s LVOT diam:     1.70 cm  LV E/e' medial:  29.4 LV SV:         45 LV SV Index:   30 LVOT Area:     2.27 cm  RIGHT VENTRICLE             IVC RV Basal diam:  3.02 cm     IVC diam: 2.27 cm RV Mid diam:    1.83 cm RV S prime:     10.80 cm/s TAPSE (M-mode): 1.8 cm LEFT ATRIUM             Index       RIGHT ATRIUM           Index LA diam:        3.80 cm 2.54 cm/m  RA Area:     19.50 cm LA Vol (A2C):   74.0 ml 49.53 ml/m RA Volume:   54.70 ml  36.61 ml/m LA Vol (A4C):   54.7 ml 36.61 ml/m LA Biplane Vol: 64.4 ml 43.10 ml/m  AORTIC VALVE LVOT Vmax:   93.80 cm/s LVOT Vmean:  61.700 cm/s LVOT VTI:    0.199 m AI PHT:      378 msec  AORTA Ao Root diam: 3.00 cm Ao Asc diam:  2.70 cm MITRAL VALVE                 TRICUSPID VALVE MV Area (PHT): 2.76 cm      TR Peak grad:   44.9 mmHg MV Decel Time: 275 msec      TR Vmax:        335.00 cm/s MR Peak grad:    126.8 mmHg MR Mean grad:    86.0  mmHg   SHUNTS MR Vmax:         563.00 cm/s Systemic VTI:  0.20 m MR Vmean:        437.0 cm/s  Systemic Diam: 1.70 cm MR PISA:         0.57 cm MR PISA Eff ROA: 4 mm MR PISA Radius:  0.30 cm MV E velocity: 150.00 cm/s MV A velocity: 77.10 cm/s MV E/A ratio:  1.95 Mertie Moores MD Electronically signed by Mertie Moores MD Signature Date/Time: 06/20/2019/1:18:33 PM    Final      Phillips Climes M.D on 07/05/2019 at 2:53 PM    [facility] -  Office  [phone]

## 2019-07-05 NOTE — Progress Notes (Signed)
Patient's BP 165/98 @1204 . PRN Hydralazine ordered for systolic greater than 403. Hydralazine administered. Repeat BP 146/75. Will continue to monitor.  Hiram Comber, RN 07/05/2019 1:11 PM

## 2019-07-05 NOTE — Transfer of Care (Signed)
Immediate Anesthesia Transfer of Care Note  Patient: NAKEA GOUGER  Procedure(s) Performed: ESOPHAGOGASTRODUODENOSCOPY (EGD) WITH PROPOFOL (N/A ) BIOPSY  Patient Location: Endoscopy Unit  Anesthesia Type:MAC  Level of Consciousness: drowsy  Airway & Oxygen Therapy: Patient Spontanous Breathing and Patient connected to nasal cannula oxygen  Post-op Assessment: Report given to RN and Post -op Vital signs reviewed and stable  Post vital signs: Reviewed and stable  Last Vitals:  Vitals Value Taken Time  BP 174/47 07/05/19 0936  Temp    Pulse 62 07/05/19 0937  Resp 11 07/05/19 0937  SpO2 100 % 07/05/19 0937  Vitals shown include unvalidated device data.  Last Pain:  Vitals:   07/05/19 0719  TempSrc: Oral  PainSc:       Patients Stated Pain Goal: 4 (34/74/25 [id number])  Complications: No apparent anesthesia complications

## 2019-07-05 NOTE — Op Note (Signed)
[facility] Patient Name: Christina [name] Procedure Date : 07/05/2019 MRN: [id number] Attending MD: Ladene Artist , MD Date of Birth: 23-Dec-1950 CSN: [id number] Age: 69 Admit Type: Inpatient Procedure:                Upper GI endoscopy Indications:              Melena Providers:                Pricilla Riffle. Fuller Plan, MD, Benetta Spar RN, RN, Cherylynn Ridges, Technician, Pearline Cables, CRNA Referring MD:             [facility] Medicines:                Monitored Anesthesia Care Complications:            No immediate complications. Estimated Blood Loss:     Estimated blood loss was minimal. Procedure:                Pre-Anesthesia Assessment:                           - Prior to the procedure, a History and Physical                            was performed, and patient medications and                            allergies were reviewed. The patient's tolerance of                            previous anesthesia was also reviewed. The risks                            and benefits of the procedure and the sedation                            options and risks were discussed with the patient.                            All questions were answered, and informed consent                            was obtained. Prior Anticoagulants: The patient has                            taken Plavix (clopidogrel), last dose was 5 days                            prior to procedure. ASA Grade Assessment: III - A                            patient with severe systemic disease. After  reviewing the risks and benefits, the patient was                            deemed in satisfactory condition to undergo the                            procedure.                           After obtaining informed consent, the endoscope was                            passed under direct vision. Throughout the                            procedure, the patient's blood pressure,  pulse, and                            oxygen saturations were monitored continuously. The                            [id number]-[id number] ([id number] Olympus gastroscope was                            introduced through the mouth, and advanced to the                            second part of duodenum. The upper GI endoscopy was                            accomplished without difficulty. The patient                            tolerated the procedure well. Scope In: Scope Out: Findings:      Multiple medium white plaques were found in the mid esophagus and in the       distal esophagus. Biopsies were taken with a cold forceps for histology.      The exam of the esophagus was otherwise normal.      Patchy mild inflammation was found in the entire examined stomach.       Previously biopsied so not repeated.      A small hiatal hernia was present.      Two non-bleeding cratered duodenal ulcers with no stigmata of bleeding       were found in the second portion of the duodenum. The largest lesion was       7 mm in largest dimension.      A moderate post-ulcer deformity was found in the duodenal bulb and in       the second portion of the duodenum.      Patchy mildly erythematous mucosa without active bleeding and with no       stigmata of bleeding was found in the duodenal bulb and in the second       portion of the duodenum c/w healing ulcer sites.      The exam of the duodenum was otherwise normal. Impression:               -  Multiple plaques in the mid esophagus and in the                            distal esophagus. Biopsied.                           - Gastritis.                           - Small hiatal hernia.                           - Two non-bleeding duodenal ulcers with no stigmata                            of bleeding.                           - Duodenal deformity.                           - Erythematous duodenopathy, several area typical                            appearance for healing  ulcers. Recommendation:           - Return patient to hospital ward for ongoing care.                           - Resume previous diet.                           - Continue present medications.                           - Continue pantoprazole 40 mg po bid for 1 month                            and then 40 mg po qd long term.                           - No aspirin, ibuprofen, naproxen, or other                            non-steroidal anti-inflammatory drugs long term. EC                            ASA 81 mg po qd is OK to start in 3 days if                            recommended by her physicians.                           - Await pathology results.                           - Resume Plavix (  clopidogrel) at prior dose in 3                            days as indicated by her physicians. Refer to                            managing physician for further adjustment of                            therapy.                           - If tolerating diet and Hgb stable she would be OK                            for discharge from a GI standpoint.                           - GI follow up with Dr. Karma Ganja. GI                            signing off. Procedure Code(s):        --- Professional ---                           [phone], Esophagogastroduodenoscopy, flexible,                            transoral; with biopsy, single or multiple Diagnosis Code(s):        --- Professional ---                           K22.8, Other specified diseases of esophagus                           K29.70, Gastritis, unspecified, without bleeding                           K44.9, Diaphragmatic hernia without obstruction or                            gangrene                           K26.9, Duodenal ulcer, unspecified as acute or                            chronic, without hemorrhage or perforation                           K31.89, Other diseases of stomach and duodenum                           K92.1,  Melena (includes Hematochezia) CPT copyright 2019 [employer]. All rights reserved. The codes documented in this report are preliminary and upon coder review may  be revised to  meet current compliance requirements. Ladene Artist, MD 07/05/2019 9:53:05 AM This report has been signed electronically. Number of Addenda: 0

## 2019-07-06 ENCOUNTER — Other Ambulatory Visit: Payer: Self-pay

## 2019-07-06 LAB — CBC
HCT: 31 % — ABNORMAL LOW (ref 36.0–46.0)
Hemoglobin: 9.7 g/dL — ABNORMAL LOW (ref 12.0–15.0)
MCH: 29.8 pg (ref 26.0–34.0)
MCHC: 31.3 g/dL (ref 30.0–36.0)
MCV: 95.4 fL (ref 80.0–100.0)
Platelets: 271 10*3/uL (ref 150–400)
RBC: 3.25 MIL/uL — ABNORMAL LOW (ref 3.87–5.11)
RDW: 17 % — ABNORMAL HIGH (ref 11.5–15.5)
WBC: 6.8 10*3/uL (ref 4.0–10.5)
nRBC: 0 % (ref 0.0–0.2)

## 2019-07-06 LAB — RENAL FUNCTION PANEL
Albumin: 2 g/dL — ABNORMAL LOW (ref 3.5–5.0)
Anion gap: 10 (ref 5–15)
BUN: 27 mg/dL — ABNORMAL HIGH (ref 8–23)
CO2: 24 mmol/L (ref 22–32)
Calcium: 8.5 mg/dL — ABNORMAL LOW (ref 8.9–10.3)
Chloride: 102 mmol/L (ref 98–111)
Creatinine, Ser: 4.25 mg/dL — ABNORMAL HIGH (ref 0.44–1.00)
GFR calc Af Amer: 12 mL/min — ABNORMAL LOW (ref >60)
GFR calc non Af Amer: 10 mL/min — ABNORMAL LOW (ref >60)
Glucose, Bld: 88 mg/dL (ref 70–99)
Phosphorus: 3.4 mg/dL (ref 2.5–4.6)
Potassium: 4 mmol/L (ref 3.5–5.1)
Sodium: 136 mmol/L (ref 135–145)

## 2019-07-06 LAB — SURGICAL PATHOLOGY

## 2019-07-06 MED ORDER — DILTIAZEM HCL ER COATED BEADS 120 MG PO CP24
120.0000 mg | ORAL_CAPSULE | Freq: Every day | ORAL | Status: DC
  Administered 2019-07-06 – 2019-07-10 (×4): 120 mg via ORAL
  Filled 2019-07-06 (×6): qty 1

## 2019-07-06 MED ORDER — LABETALOL HCL 100 MG PO TABS
100.0000 mg | ORAL_TABLET | Freq: Once | ORAL | Status: AC
  Administered 2019-07-06: 100 mg via ORAL
  Filled 2019-07-06: qty 1

## 2019-07-06 MED ORDER — METOPROLOL TARTRATE 50 MG PO TABS
50.0000 mg | ORAL_TABLET | Freq: Two times a day (BID) | ORAL | Status: DC
  Administered 2019-07-06 – 2019-07-12 (×10): 50 mg via ORAL
  Filled 2019-07-06 (×11): qty 1

## 2019-07-06 MED ORDER — PANTOPRAZOLE SODIUM 40 MG PO TBEC
40.0000 mg | DELAYED_RELEASE_TABLET | Freq: Two times a day (BID) | ORAL | Status: DC
  Administered 2019-07-06 – 2019-07-14 (×16): 40 mg via ORAL
  Filled 2019-07-06 (×17): qty 1

## 2019-07-06 MED ORDER — CALCITRIOL 0.5 MCG PO CAPS
ORAL_CAPSULE | ORAL | Status: AC
  Administered 2019-07-06: 1 ug via ORAL
  Filled 2019-07-06: qty 2

## 2019-07-06 MED ORDER — HALOPERIDOL LACTATE 5 MG/ML IJ SOLN
5.0000 mg | Freq: Three times a day (TID) | INTRAMUSCULAR | Status: DC | PRN
  Filled 2019-07-06: qty 1

## 2019-07-06 MED ORDER — HALOPERIDOL 5 MG PO TABS
5.0000 mg | ORAL_TABLET | Freq: Three times a day (TID) | ORAL | Status: DC | PRN
  Filled 2019-07-06: qty 1

## 2019-07-06 MED ORDER — HYDRALAZINE HCL 25 MG PO TABS
25.0000 mg | ORAL_TABLET | Freq: Once | ORAL | Status: AC
  Administered 2019-07-06: 25 mg via ORAL
  Filled 2019-07-06: qty 1

## 2019-07-06 NOTE — Progress Notes (Addendum)
[facility]  PROGRESS NOTE  Christina [name] OZH:[id number] DOB: 1950-11-27 DOA: 06/29/2019 PCP: Maggie Schwalbe, PA-C   Brief HPI:   69 year old female with medical history significant of hypertension, hyperlipidemia, CAD, atrial fibrillation, COPD, ESRD on HD TTS, SBO, rectal prolapse, polysubstance abuse including cocaine who initially presented with complaints of rectal pain aggressively worsening over the past 4 days.  Patient missed her hemodialysis due to pain.  She is followed by Dr. Oneida Alar vascular surgery who has been contemplating amputating her toes.  Patient has had several bloody bowel movements while she was in the hospital.  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  She was hospitalized from 05/10/2019 to 05/12/2019 with acute respiratory failure secondary to volume overload and COPD exacerbation.  4/25-4/27 at [facility] for atrial fibrillation with RVR, no longer on anticoagulation due to large duodenal ulcer.  5/8-5/9, hospitalized for peripheral arterial disease and gangrene of left foot for which she was discharged to follow with Dr. Oneida Alar.  Lastly hospitalized from 5/18-5/24 with GI bleed found to have several deep duodenal ulcers without underlying active sign of bleeding. Patient was found to be hypotensive on admission with BP 76/52, which responded to PRBC.  But she was continued to have melena during hospital stay.  She was seen by GI, hemoglobin stabilized after units PRBC.  Plan for endoscopy per GI and arteriogram per vascular surgery.   Subjective   Patient seen and examined, denies any complaints.  Patient is status post EGD on 07/05/2019.  EGD showed multiple plaques in the esophagus, 2 nonbleeding ulcers in the duodenum, erythematous duodenitis.  Hemoglobin today is stable  at 9.7.   Assessment/Plan:     1. GI bleed/acute blood loss anemia-endoscopy showed duodenal ulcer with esophageal plaques.  Biopsies are pending.  Patient was transfused 3 units PRBC.  Hemoglobin is stable at 9.7.  Aspirin and Plavix are currently on hold.  It can be resumed in 3 days per GI if indicated.  She will need antiplatelet therapy pending vascular surgery evaluation.  Continue PPI. 2. Peripheral vascular disease with ischemic changes without gangrene-vascular surgery has been consulted, plan for arteriogram this week.  Will await vascular surgery recommendation for restarting antiplatelet therapy. 3. Delirium-resolved, likely at baseline.  Patient had multiple episodes of delirium for which she required IM Haldol.  We will continue to monitor. 4. ESRD on hemodialysis-nephrology following. 5. Rectal prolapse-acute on chronic, lidocaine jelly as needed. 6. Paroxysmal atrial fibrillation-she is not on anticoagulation due to history of GI bleed, continue amiodarone, metoprolol. 7. COPD without acute exacerbation-continue Dulera, as needed DuoNebs. 8. Hyperlipidemia-continue pravastatin. 9. Hypertension-blood pressure is elevated, patient is on metoprolol 25 mg p.o. twice daily, Cardizem.  Will increase the dose of metoprolol to 50 mg p.o. twice daily.    SpO2: 97 % O2 Flow Rate (L/min): 2 L/min   COVID-19 Labs  No results for input(s): DDIMER, FERRITIN, LDH, CRP in the last 72 hours.  Lab Results  Component Value Date   SARSCOV2NAA NEGATIVE 06/29/2019   [place] NEGATIVE 06/14/2019   [place] NEGATIVE 05/10/2019   [place] NEGATIVE 04/23/2019     CBG: No results for input(s): GLUCAP in the last 168 hours.  CBC: Recent Labs  Lab 07/01/19 2358 07/02/19 0719 07/02/19 2335 07/03/19 1145 07/04/19 1023 07/05/19 0601 07/06/19 0450  WBC 7.3  --   --  6.9 8.0 7.5 6.8  HGB 9.0*   < > 9.8* 9.7* 10.2* 10.1* 9.7*  HCT 27.1*   < > 30.7* 30.2* 32.2* 31.0* 31.0*  MCV  89.7  --   --  92.1 94.4 93.1 95.4  PLT 178  --   --  217 281 243 271   < > = values in this interval not displayed.    Basic Metabolic Panel: Recent Labs  Lab 06/30/19 0456 07/01/19 0617 07/02/19 0719 07/04/19 1023 07/06/19 0757  NA 137 136 133* 136 136  K 5.5* 4.3 5.2* 4.0 4.0  CL 108 101 99 102 102  CO2 18* 23 20* 24 24  GLUCOSE 99 85 110* 125* 88  BUN 110* 52* 64* 44* 27*  CREATININE 7.25* 4.18* 5.19* 5.05* 4.25*  CALCIUM 7.7* 7.6* 8.0* 8.6* 8.5*  PHOS 8.3*  --  5.3*  --  3.4     Liver Function Tests: Recent Labs  Lab 06/30/19 0456 07/02/19 0719 07/06/19 0757  ALBUMIN 2.0* 2.0* 2.0*    DVT prophylaxis: SCDs  Code Status: Full code  Family Communication: No family at bedside    Status is: Inpatient  Dispo: The patient is from: Home              Anticipated d/c is to: Skilled nursing facility              Anticipated d/c date is: 07/09/19              Patient currently not medically stable for discharge  Barrier to discharge-arteriogram has been planned     Scheduled medications:  . allopurinol  100 mg Oral Daily  . amiodarone  200 mg Oral BID  . calcitRIOL  1 mcg Oral Q M,W,F-HD  . Chlorhexidine Gluconate Cloth  6 each Topical Q0600  . cholecalciferol  2,000 Units Oral Daily  . darbepoetin (ARANESP) injection - DIALYSIS  200 mcg Intravenous Q Sat-HD  . dicyclomine  10 mg Oral QHS  . diltiazem  120 mg Oral Daily  . docusate sodium  100 mg Oral BID  . feeding supplement (NEPRO CARB STEADY)  237 mL Oral BID BM  . ferric citrate  630 mg Oral TID WC  . haloperidol lactate  4 mg Intravenous Once  . isosorbide mononitrate  30 mg Oral QHS  . LORazepam  1 mg Intravenous Once  . metoprolol tartrate  25 mg Oral BID  . mometasone-formoterol  2 puff Inhalation BID  . pantoprazole  40 mg Oral Q12H  . pravastatin  80 mg Oral QPM  . sodium chloride flush  3 mL Intravenous Q12H  . sucralfate  1 g Oral Q6H    Consultants:  Gastroenterology  Vascular  surgery  Procedures:  Endoscopy 07/05/2019  Antibiotics:   Anti-infectives (From admission, onward)   None       Objective   Vitals:   07/06/19 1310 07/06/19 1327 07/06/19 1330 07/06/19 1528  BP:  (!) 195/77  (!) 169/72  Pulse:  76 77   Resp:    14  Temp: 98.3 F (36.8 C) 98.3 F (36.8 C)    TempSrc:  Oral    SpO2:  97%    Weight:      Height:        Intake/Output Summary (Last 24 hours) at 07/06/2019 1654 Last data filed at 07/06/2019 1330 Gross per 24 hour  Intake --  Output 2250 ml  Net -2250 ml    06/07 1901 - 06/09 0700  In: 45 [P.O.:600; I.V.:200] Out: 150 [Urine:150]  Filed Weights   07/06/19 0415 07/06/19 0742 07/06/19 1203  Weight: 46.1 kg 46.3 kg 45 kg    Physical Examination:    General: Appears in no acute distress  Cardiovascular: S1-S2, regular, no murmur auscultated  Respiratory: Clear to auscultation bilaterally  Abdomen: Abdomen is soft, nontender, no organomegaly  Extremities: Dusky erythema noted in left lower extremity, peripheral pulses are feeble  Neurologic: Cranial nerves II through XII grossly intact, no focal deficit noted    Data Reviewed:   Recent Results (from the past 240 hour(s))  SARS Coronavirus 2 by RT PCR (hospital order, performed in [facility] hospital lab) Nasopharyngeal Nasopharyngeal Swab     Status: None   Collection Time: 06/29/19  5:01 PM   Specimen: Nasopharyngeal Swab  Result Value Ref Range Status   SARS Coronavirus 2 NEGATIVE NEGATIVE Final    Comment: (NOTE) SARS-CoV-2 target nucleic acids are NOT DETECTED. The SARS-CoV-2 RNA is generally detectable in upper and lower respiratory specimens during the acute phase of infection. The lowest concentration of SARS-CoV-2 viral copies this assay can detect is 250 copies / mL. A negative result does not preclude SARS-CoV-2 infection and should not be used as the sole basis for treatment or other patient management decisions.  A negative result may occur  with improper specimen collection / handling, submission of specimen other than nasopharyngeal swab, presence of viral mutation(s) within the areas targeted by this assay, and inadequate number of viral copies (<250 copies / mL). A negative result must be combined with clinical observations, patient history, and epidemiological information. Fact Sheet for Patients:   StrictlyIdeas.no Fact Sheet for Healthcare Providers: BankingDealers.co.za This test is not yet approved or cleared  by the [place] FDA and has been authorized for detection and/or diagnosis of SARS-CoV-2 by FDA under an Emergency Use Authorization (EUA).  This EUA will remain in effect (meaning this test can be used) for the duration of the COVID-19 declaration under Section 564(b)(1) of the Act, 21 U.S.C. section 360bbb-3(b)(1), unless the authorization is terminated or revoked sooner. Performed at [facility], [place] [address] [place], [place] [address]   MRSA PCR Screening     Status: None   Collection Time: 06/30/19  5:37 PM   Specimen: Nasopharyngeal  Result Value Ref Range Status   MRSA by PCR NEGATIVE NEGATIVE Final    Comment:        The GeneXpert MRSA Assay (FDA approved for NASAL specimens only), is one component of a comprehensive MRSA colonization surveillance program. It is not intended to diagnose MRSA infection nor to guide or monitor treatment for MRSA infections. Performed at [facility], [place] [address] [place], [place] [address]     No results for input(s): LIPASE, AMYLASE in the last 168 hours. No results for input(s): AMMONIA in the last 168 hours.  Cardiac Enzymes: No results for input(s): CKTOTAL, CKMB, CKMBINDEX, TROPONINI in the last 168 hours. BNP (last 3 results) Recent Labs    01/24/19 2228 02/04/19 0103 03/25/19 1313  BNP 1,570.0* 4,197.2* 2,007.2*    [place]   [facility] If 7PM-7AM, please  contact night-coverage at [email or web address], Office  [phone]   07/06/2019, 4:54 PM  LOS: 6 days

## 2019-07-06 NOTE — Progress Notes (Signed)
Pt's BP is still elevated after one time dose of Hydralazine 25mg . Reported off to night nurse.

## 2019-07-06 NOTE — Progress Notes (Signed)
Got a verbal order from Dr. Darrick Meigs for Protonix 40mg  PO every 12hrs.

## 2019-07-06 NOTE — Progress Notes (Signed)
Pt return from HD. BP 195/77 at 1327. PRN Hydralazine ordered for systolic greater than 533 every 6hrs. Hydralazine administered. BP recheck 169/72. Will continue to monitor.

## 2019-07-06 NOTE — Progress Notes (Signed)
[facility] Progress Note   Dialysis Orders: [facility] TTS 4 hr EDW 48 kgis ?? F180 BFR 350 mL/ min DFR A1.5 UF profile 2no heparin Venofer 100 mg q rx x 9, started 5/27 Mircera 200 q 2 weeks, last given 06/02/19 Calcitriol 1.0 cmg q rx  Assessment/Plan: 1.Recurrent GIB - melanoic stools on HD with abdominal pain -s/p 4 units PRBC GI following - presumed bleeding from previously diagnosed severe duodenal ulcerations, recent acute GIB in May with multiple deep duodenal ulcers - PPI indefinitely - On PPI bid    Repeat EGD 6/8 showed multiple plagues in the mid and distal esophagus, smal HH, two non-bleeding duodenal ulcers wtihout stigmata of bleeding, duodenal deformity, erythematous duodenopathy , = resume plavix in 2 day - continue no heparin HD; looks like she'll remain inpt to make sure H/H stable 2. ESRD- TTS AVF used 2 needles 6/5Qb 250 no problems with cannulation.- extra HD Monday.  Now off schedule - high inpatient load so didn't run yesterday, HD now. Plan resume TTS schedule prior to discharge, next planned HD Friday unless plans for D/C tomorrow let me know so we can run her prior.   3. Anemia- Acute on chronic 2/2 #1. hgb stabilized (6.8 > 7.2>9 6/4> 9.8 6/5> 9.7 6/6> 10.1 stable > 6/9 9.7)- behind on ESA dosing -Aranesp 200 given 6/5 - continue course of Fe 4. Secondary hyperparathyroidism- resumed calcitriol, Ca and phos in goal.  5.HTN/volumeVolume status improving. Volume overloaded on admit. Generalized edema - EDW was raised from 40.5 >48 after last admission - post wt prior to last admission was 44 at the most - need to tritrate EDW back down some what.-net UF 4 L 6/5 with post wt 50.3 -net UF 2.97 6/7 - post wt not done - 49.2 was pre weight - so calculated post wt would be 46.7.  UF today with standing post weight.  Resume home dilt 120 daily today, cont metoprolol.  6. Nutrition-alb very lowchange to renal diet, protein supplements,  vit 7. Mesenteric ischemia s/p SMA stent 05/2019 8.PAD -ischemic left 4th and 5th toesDr Early seeing- planning for arteriogram per [facility]VS 9. Atrial fibrillation -- on amiodarone,in NSR 10. Hx cocaine abuse - UDS negative  11. 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 DNR 13. Disp -tentative SNF  Jannifer Hick MD [facility] Pager [phone]  Subjective:   C/o burning pain in hands and feet with HD - not a new issue.  2 needles in AVF Qb 200.   Objective Vitals:   07/06/19 0800 07/06/19 0830 07/06/19 0900 07/06/19 0930  BP: (!) 191/74 (!) 191/82 (!) 191/83 (!) 198/87  Pulse: 61 63 68 70  Resp:      Temp:      TempSrc:      SpO2:      Weight:      Height:       Physical Exam General: NAD calm Heart: RRR Lungs: no rales Abdomen:  Soft NT Extremities: tr + 1 + LE edema toes/feet tender, R hand is warm and perfused, 1+ radial pulse Dialysis Access:  TDC and left upper AVF + bruit with Qb 200   Additional Objective Labs: Basic Metabolic Panel: Recent Labs  Lab 06/30/19 0456 07/01/19 0617 07/02/19 0719 07/04/19 1023 07/06/19 0757  NA 137   < > 133* 136 136  K 5.5*   < > 5.2* 4.0 4.0  CL 108   < > 99 102 102  CO2  18*   < > 20* 24 24  GLUCOSE 99   < > 110* 125* 88  BUN 110*   < > 64* 44* 27*  CREATININE 7.25*   < > 5.19* 5.05* 4.25*  CALCIUM 7.7*   < > 8.0* 8.6* 8.5*  PHOS 8.3*  --  5.3*  --  3.4   < > = values in this interval not displayed.   Liver Function Tests: Recent Labs  Lab 06/29/19 1610 06/29/19 1610 06/30/19 0456 07/02/19 0719 07/06/19 0757  AST 15  --   --   --   --   ALT 11  --   --   --   --   ALKPHOS 43  --   --   --   --   BILITOT 0.7  --   --   --   --   PROT 4.9*  --   --   --   --   ALBUMIN 2.2*   < > 2.0* 2.0* 2.0*   < > = values in this interval not displayed.   No results for input(s): LIPASE, AMYLASE in the last 168 hours. CBC: Recent Labs  Lab 06/29/19 1610 06/30/19 0457 07/01/19 2358  07/02/19 0719 07/03/19 1145 07/03/19 1145 07/04/19 1023 07/05/19 0601 07/06/19 0450  WBC 6.3   < > 7.3  --  6.9   < > 8.0 7.5 6.8  NEUTROABS 4.7  --   --   --   --   --   --   --   --   HGB 6.8*   < > 9.0*   < > 9.7*   < > 10.2* 10.1* 9.7*  HCT 22.5*   < > 27.1*   < > 30.2*   < > 32.2* 31.0* 31.0*  MCV 94.1   < > 89.7  --  92.1  --  94.4 93.1 95.4  PLT 261   < > 178  --  217   < > 281 243 271   < > = values in this interval not displayed.   Blood Culture    Component Value Date/Time   SDES BLOOD RIGHT HAND 06/17/2019 1223   [place]  06/17/2019 1223    BOTTLES DRAWN AEROBIC ONLY Blood Culture results may not be optimal due to an inadequate volume of blood received in culture bottles   CULT  06/17/2019 1223    NO GROWTH 5 DAYS Performed at [facility], [place] [address] [place], [place] [address]    REPTSTATUS 06/22/2019 FINAL 06/17/2019 1223    Cardiac Enzymes: No results for input(s): CKTOTAL, CKMB, CKMBINDEX, TROPONINI in the last 168 hours. CBG: No results for input(s): GLUCAP in the last 168 hours. Iron Studies: No results for input(s): IRON, TIBC, TRANSFERRIN, FERRITIN in the last 72 hours. Lab Results  Component Value Date   INR 1.2 06/14/2019   INR 1.1 03/25/2019   INR 0.9 01/26/2019   Studies/Results: No results found. Medications: . ferric gluconate (FERRLECIT/NULECIT) IV Stopped (07/04/19 1614)   . allopurinol  100 mg Oral Daily  . amiodarone  200 mg Oral BID  . calcitRIOL  1 mcg Oral Q M,W,F-HD  . Chlorhexidine Gluconate Cloth  6 each Topical Q0600  . cholecalciferol  2,000 Units Oral Daily  . darbepoetin (ARANESP) injection - DIALYSIS  200 mcg Intravenous Q Sat-HD  . dicyclomine  10 mg Oral QHS  . docusate sodium  100 mg Oral BID  . feeding supplement (NEPRO CARB STEADY)  237 mL Oral BID BM  . ferric citrate  630 mg Oral TID WC  . haloperidol lactate  4 mg Intravenous Once  . isosorbide mononitrate  30 mg Oral QHS  . LORazepam  1 mg  Intravenous Once  . metoprolol tartrate  25 mg Oral BID  . mometasone-formoterol  2 puff Inhalation BID  . pantoprazole  40 mg Intravenous Q12H  . pravastatin  80 mg Oral QPM  . sodium chloride flush  3 mL Intravenous Q12H  . sucralfate  1 g Oral Q6H

## 2019-07-06 NOTE — Progress Notes (Signed)
Verbal order for one time dose of Hydralazine 25mg   from Dr. Darrick Meigs.

## 2019-07-07 LAB — CBC
HCT: 34.7 % — ABNORMAL LOW (ref 36.0–46.0)
Hemoglobin: 10.7 g/dL — ABNORMAL LOW (ref 12.0–15.0)
MCH: 29.8 pg (ref 26.0–34.0)
MCHC: 30.8 g/dL (ref 30.0–36.0)
MCV: 96.7 fL (ref 80.0–100.0)
Platelets: 296 10*3/uL (ref 150–400)
RBC: 3.59 MIL/uL — ABNORMAL LOW (ref 3.87–5.11)
RDW: 17.1 % — ABNORMAL HIGH (ref 11.5–15.5)
WBC: 5.5 10*3/uL (ref 4.0–10.5)
nRBC: 0 % (ref 0.0–0.2)

## 2019-07-07 LAB — BASIC METABOLIC PANEL
Anion gap: 11 (ref 5–15)
BUN: 11 mg/dL (ref 8–23)
CO2: 25 mmol/L (ref 22–32)
Calcium: 8.4 mg/dL — ABNORMAL LOW (ref 8.9–10.3)
Chloride: 101 mmol/L (ref 98–111)
Creatinine, Ser: 3.04 mg/dL — ABNORMAL HIGH (ref 0.44–1.00)
GFR calc Af Amer: 17 mL/min — ABNORMAL LOW (ref >60)
GFR calc non Af Amer: 15 mL/min — ABNORMAL LOW (ref >60)
Glucose, Bld: 91 mg/dL (ref 70–99)
Potassium: 3.5 mmol/L (ref 3.5–5.1)
Sodium: 137 mmol/L (ref 135–145)

## 2019-07-07 MED ORDER — HYDRALAZINE HCL 10 MG PO TABS
10.0000 mg | ORAL_TABLET | Freq: Four times a day (QID) | ORAL | Status: DC | PRN
  Administered 2019-07-08: 10 mg via ORAL
  Filled 2019-07-07: qty 1

## 2019-07-07 NOTE — Progress Notes (Signed)
[facility] Progress Note   Dialysis Orders: [facility] T[facility] 4 hr EDW 48 kgis ?? F180 BFR 350 mL/ min DFR A1.5 UF profile 2no heparin Venofer 100 mg q rx x 9, started 5/27 Mircera 200 q 2 weeks, last given 06/02/19 Calcitriol 1.0 cmg q rx  Assessment/Plan: 1.Recurrent GIB - melanoic stools on HD with abdominal pain -s/p 4 units PRBC GI following - presumed bleeding from previously diagnosed severe duodenal ulcerations, recent acute GIB in May with multiple deep duodenal ulcers - PPI indefinitely - On PPI bid    Repeat EGD 6/8 showed multiple plagues in the mid and distal esophagus, smal HH, two non-bleeding duodenal ulcers wtihout stigmata of bleeding, duodenal deformity, erythematous duodenopathy ,to resume plavix;  continue no heparin hgb stable  2. ESRD- TTS AVF using two needles without problem- 17 gauge - plan ^ to 16 for Saturday.Adella Hare and Wed of this week - high patient load today.  For arteriogram tomorrow - volume down so I think we can hold HD until Saturday and get back on schedule. K 3.5 today 3. Anemia- Acute on chronic 2/2 #1. hgb stabilized (6.8 > 7.2>9 6/4> 9.8 6/5> 9.7 6/6> 10.1 stable > 6/9 9.7)- behind on ESA dosing -Aranesp 200 given 6/5 - continue course of Fe hgb 10.7 (HD yesterday). Follow trends - if hgb > 10.5 tomorrow - will titrate dose of Mircera downward. 4. Secondary hyperparathyroidism- resumed calcitriol, Ca and phos in goal.  5.HTN/volumeVolume status improving. Volume overloaded on admit. Generalized edema - EDW was raised from 40.5 >48 after last admission - post wt prior to last admission was 44 at the most - need to tritrate EDW back down some what.-net UF 4 L 6/5 with post wt 50.3 -net UF 2.97 6/7 - post wt not done - 49.2 was pre weight - so calculated post wt would be 46.7.   Net UF 2 L 6/9 with post wt 45 kg. Resumed home dilt 120 daily , cont metoprolol.  6. Nutrition-alb very lowchange to renal diet, protein  supplements, vit 7. Mesenteric ischemia s/p SMA stent 05/2019 8.PAD -ischemic left 4th and 5th toesDr Early seeing- planning for arteriogram per VVS 9. Atrial fibrillation -- on amiodarone,in NSR 10. Hx cocaine abuse - UDS negative  11. 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 DNR 13. Disp -tentative SNF  Jannifer Hick MD [facility] Pager [phone]  Subjective:   No new issues - happy to be having arteriogram tomorrow Objective Vitals:   07/07/19 0718 07/07/19 0820 07/07/19 0844 07/07/19 0845  BP: (!) 170/60  (!) 163/59 (!) 163/59  Pulse: 90   60  Resp: 12     Temp: 98.3 F (36.8 C)     TempSrc: Oral     SpO2: 95% 94%    Weight:      Height:       Physical Exam General: NAD spirits good chatty Heart: RRR Lungs: no rales Abdomen:  Soft NT Extremities: tr LE edema toes/feet tender,  Dialysis Access:  [facility] and left upper AVF + bruit    Additional Objective Labs: Basic Metabolic Panel: Recent Labs  Lab 07/02/19 0719 07/02/19 0719 07/04/19 1023 07/06/19 0757 07/07/19 0434  NA 133*   < > 136 136 137  K 5.2*   < > 4.0 4.0 3.5  CL 99   < > 102 102 101  CO2 20*   < > 24 24 25   GLUCOSE 110*   < >  125* 88 91  BUN 64*   < > 44* 27* 11  CREATININE 5.19*   < > 5.05* 4.25* 3.04*  CALCIUM 8.0*   < > 8.6* 8.5* 8.4*  PHOS 5.3*  --   --  3.4  --    < > = values in this interval not displayed.   Liver Function Tests: Recent Labs  Lab 07/02/19 0719 07/06/19 0757  ALBUMIN 2.0* 2.0*   No results for input(s): LIPASE, AMYLASE in the last 168 hours. CBC: Recent Labs  Lab 07/03/19 1145 07/03/19 1145 07/04/19 1023 07/04/19 1023 07/05/19 0601 07/06/19 0450 07/07/19 0434  WBC 6.9   < > 8.0   < > 7.5 6.8 5.5  HGB 9.7*   < > 10.2*   < > 10.1* 9.7* 10.7*  HCT 30.2*   < > 32.2*   < > 31.0* 31.0* 34.7*  MCV 92.1  --  94.4  --  93.1 95.4 96.7  PLT 217   < > 281   < > 243 271 296   < > = values in this interval not displayed.   Blood  Culture    Component Value Date/Time   SDES BLOOD RIGHT HAND 06/17/2019 1223   [place]  06/17/2019 1223    BOTTLES DRAWN AEROBIC ONLY Blood Culture results may not be optimal due to an inadequate volume of blood received in culture bottles   CULT  06/17/2019 1223    NO GROWTH 5 DAYS Performed at [facility], [place] [address], [place], [place] [address]    REPTSTATUS 06/22/2019 FINAL 06/17/2019 1223    Cardiac Enzymes: No results for input(s): CKTOTAL, CKMB, CKMBINDEX, TROPONINI in the last 168 hours. CBG: No results for input(s): GLUCAP in the last 168 hours. Iron Studies: No results for input(s): IRON, TIBC, TRANSFERRIN, FERRITIN in the last 72 hours. Lab Results  Component Value Date   INR 1.2 06/14/2019   INR 1.1 03/25/2019   INR 0.9 01/26/2019   Studies/Results: No results found. Medications: . ferric gluconate (FERRLECIT/NULECIT) IV Stopped (07/06/19 1101)   . allopurinol  100 mg Oral Daily  . amiodarone  200 mg Oral BID  . calcitRIOL  1 mcg Oral Q M,W,F-HD  . Chlorhexidine Gluconate Cloth  6 each Topical Q0600  . cholecalciferol  2,000 Units Oral Daily  . darbepoetin (ARANESP) injection - DIALYSIS  200 mcg Intravenous Q Sat-HD  . dicyclomine  10 mg Oral QHS  . diltiazem  120 mg Oral Daily  . docusate sodium  100 mg Oral BID  . feeding supplement (NEPRO CARB STEADY)  237 mL Oral BID BM  . ferric citrate  630 mg Oral TID WC  . haloperidol lactate  4 mg Intravenous Once  . isosorbide mononitrate  30 mg Oral QHS  . LORazepam  1 mg Intravenous Once  . metoprolol tartrate  50 mg Oral BID  . mometasone-formoterol  2 puff Inhalation BID  . pantoprazole  40 mg Oral Q12H  . pravastatin  80 mg Oral QPM  . sodium chloride flush  3 mL Intravenous Q12H  . sucralfate  1 g Oral Q6H

## 2019-07-07 NOTE — Progress Notes (Signed)
[facility]  PROGRESS NOTE  Christina [name] ZOX:[id number] DOB: 20-Mar-1950 DOA: 06/29/2019 PCP: Maggie Schwalbe, PA-C   Brief HPI:   69 year old female with medical history significant of hypertension, hyperlipidemia, CAD, atrial fibrillation, COPD, ESRD on HD TTS, SBO, rectal prolapse, polysubstance abuse including cocaine who initially presented with complaints of rectal pain aggressively worsening over the past 4 days.  Patient missed her hemodialysis due to pain.  She is followed by Dr. Oneida Alar vascular surgery who has been contemplating amputating her toes.  Patient has had several bloody bowel movements while she was in the hospital.  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  She was hospitalized from 05/10/2019 to 05/12/2019 with acute respiratory failure secondary to volume overload and COPD exacerbation.  4/25-4/27 at [facility] for atrial fibrillation with RVR, no longer on anticoagulation due to large duodenal ulcer.  5/8-5/9, hospitalized for peripheral arterial disease and gangrene of left foot for which she was discharged to follow with Dr. Oneida Alar.  Lastly hospitalized from 5/18-5/24 with GI bleed found to have several deep duodenal ulcers without underlying active sign of bleeding. Patient was found to be hypotensive on admission with BP 76/52, which responded to PRBC.  But she was continued to have melena during hospital stay.  She was seen by GI, hemoglobin stabilized after units PRBC.  Plan for endoscopy per GI and arteriogram per vascular surgery.   Subjective   Patient seen and examined, denies any new complaints.  Awaiting arteriogram of lower extremity.   Assessment/Plan:     1. GI bleed/acute blood loss anemia-endoscopy showed duodenal ulcer with esophageal plaques.  Biopsies  are pending.  Patient was transfused 3 units PRBC.  Hemoglobin is stable at 9.7.  Aspirin and Plavix are currently on hold.  It can be resumed in 3 days per GI if indicated.  She will need antiplatelet therapy pending vascular surgery evaluation.  Continue PPI. 2. Peripheral vascular disease with ischemic changes without gangrene-vascular surgery has been consulted, plan for arteriogram this week.  Will await vascular surgery recommendation for restarting antiplatelet therapy. 3. Delirium-resolved, likely at baseline.  Patient had multiple episodes of delirium for which she required IM Haldol.  We will continue to monitor. 4. ESRD on hemodialysis-nephrology following. 5. Rectal prolapse-acute on chronic, lidocaine jelly as needed. 6. Paroxysmal atrial fibrillation-she is not on anticoagulation due to history of GI bleed, continue amiodarone, metoprolol. 7. COPD without acute exacerbation-continue Dulera, as needed DuoNebs. 8. Hyperlipidemia-continue pravastatin. 9. Hypertension-blood pressure was elevated, dose of metoprolol was changed to 50 mg twice a day.     SpO2: 98 % O2 Flow Rate (L/min): 2 L/min   COVID-19 Labs  No results for input(s): DDIMER, FERRITIN, LDH, CRP in the last 72 hours.  Lab Results  Component Value Date   SARSCOV2NAA NEGATIVE 06/29/2019   [place] NEGATIVE 06/14/2019   [place] NEGATIVE 05/10/2019   [place] NEGATIVE 04/23/2019     CBG: No results for input(s): GLUCAP in the last 168 hours.  CBC: Recent Labs  Lab 07/03/19 1145 07/04/19 1023 07/05/19 0601 07/06/19 0450 07/07/19 0434  WBC 6.9 8.0 7.5 6.8 5.5  HGB 9.7* 10.2* 10.1* 9.7* 10.7*  HCT 30.2* 32.2* 31.0* 31.0* 34.7*  MCV 92.1 94.4 93.1 95.4 96.7  PLT 217 281 243 271 811    Basic Metabolic Panel: Recent Labs  Lab  07/01/19 0617 07/02/19 0719 07/04/19 1023 07/06/19 0757 07/07/19 0434  NA 136 133* 136 136 137  K 4.3 5.2* 4.0 4.0 3.5  CL 101 99 102 102 101  CO2 23 20* 24 24 25    GLUCOSE 85 110* 125* 88 91  BUN 52* 64* 44* 27* 11  CREATININE 4.18* 5.19* 5.05* 4.25* 3.04*  CALCIUM 7.6* 8.0* 8.6* 8.5* 8.4*  PHOS  --  5.3*  --  3.4  --      Liver Function Tests: Recent Labs  Lab 07/02/19 0719 07/06/19 0757  ALBUMIN 2.0* 2.0*    DVT prophylaxis: SCDs  Code Status: Full code  Family Communication: No family at bedside    Status is: Inpatient  Dispo: The patient is from: Home              Anticipated d/c is to: Skilled nursing facility              Anticipated d/c date is: 07/09/19              Patient currently not medically stable for discharge  Barrier to discharge-arteriogram has been planned for tomorrow.     Scheduled medications:  . allopurinol  100 mg Oral Daily  . amiodarone  200 mg Oral BID  . calcitRIOL  1 mcg Oral Q M,W,F-HD  . Chlorhexidine Gluconate Cloth  6 each Topical Q0600  . cholecalciferol  2,000 Units Oral Daily  . darbepoetin (ARANESP) injection - DIALYSIS  200 mcg Intravenous Q Sat-HD  . dicyclomine  10 mg Oral QHS  . diltiazem  120 mg Oral Daily  . docusate sodium  100 mg Oral BID  . feeding supplement (NEPRO CARB STEADY)  237 mL Oral BID BM  . ferric citrate  630 mg Oral TID WC  . haloperidol lactate  4 mg Intravenous Once  . isosorbide mononitrate  30 mg Oral QHS  . LORazepam  1 mg Intravenous Once  . metoprolol tartrate  50 mg Oral BID  . mometasone-formoterol  2 puff Inhalation BID  . pantoprazole  40 mg Oral Q12H  . pravastatin  80 mg Oral QPM  . sodium chloride flush  3 mL Intravenous Q12H  . sucralfate  1 g Oral Q6H    Consultants:  Gastroenterology  Vascular surgery  Procedures:  Endoscopy 07/05/2019  Antibiotics:   Anti-infectives (From admission, onward)   None       Objective   Vitals:   07/07/19 0820 07/07/19 0844 07/07/19 0845 07/07/19 1151  BP:  (!) 163/59 (!) 163/59 (!) 145/53  Pulse:   60 (!) 54  Resp:    12  Temp:    98.2 F (36.8 C)  TempSrc:    Oral  SpO2: 94%   98%   Weight:      Height:        Intake/Output Summary (Last 24 hours) at 07/07/2019 1352 Last data filed at 07/07/2019 0802 Gross per 24 hour  Intake 240 ml  Output --  Net 240 ml    06/08 1901 - 06/10 0700 In: 240 [P.O.:240] Out: 2250 [Urine:250]  Filed Weights   07/06/19 0742 07/06/19 1203 07/07/19 0401  Weight: 46.3 kg 45 kg 41.9 kg    Physical Examination:    General-appears in no acute distress  Heart-S1-S2, regular, no murmur auscultated  Lungs-clear to auscultation bilaterally, no wheezing or crackles auscultated  Abdomen-soft, nontender, no organomegaly  Extremities-no edema in the lower extremities, dusky erythema in left lower extremity  Neuro-alert, oriented  x3, no focal deficit noted    Data Reviewed:   Recent Results (from the past 240 hour(s))  SARS Coronavirus 2 by RT PCR (hospital order, performed in [facility] hospital lab) Nasopharyngeal Nasopharyngeal Swab     Status: None   Collection Time: 06/29/19  5:01 PM   Specimen: Nasopharyngeal Swab  Result Value Ref Range Status   SARS Coronavirus 2 NEGATIVE NEGATIVE Final    Comment: (NOTE) SARS-CoV-2 target nucleic acids are NOT DETECTED. The SARS-CoV-2 RNA is generally detectable in upper and lower respiratory specimens during the acute phase of infection. The lowest concentration of SARS-CoV-2 viral copies this assay can detect is 250 copies / mL. A negative result does not preclude SARS-CoV-2 infection and should not be used as the sole basis for treatment or other patient management decisions.  A negative result may occur with improper specimen collection / handling, submission of specimen other than nasopharyngeal swab, presence of viral mutation(s) within the areas targeted by this assay, and inadequate number of viral copies (<250 copies / mL). A negative result must be combined with clinical observations, patient history, and epidemiological information. Fact Sheet for Patients:    StrictlyIdeas.no Fact Sheet for Healthcare Providers: BankingDealers.co.za This test is not yet approved or cleared  by the [place] FDA and has been authorized for detection and/or diagnosis of SARS-CoV-2 by FDA under an Emergency Use Authorization (EUA).  This EUA will remain in effect (meaning this test can be used) for the duration of the COVID-19 declaration under Section 564(b)(1) of the Act, 21 U.S.C. section 360bbb-3(b)(1), unless the authorization is terminated or revoked sooner. Performed at [facility], [place] [address], [place], [place] [address]   MRSA PCR Screening     Status: None   Collection Time: 06/30/19  5:37 PM   Specimen: Nasopharyngeal  Result Value Ref Range Status   MRSA by PCR NEGATIVE NEGATIVE Final    Comment:        The GeneXpert MRSA Assay (FDA approved for NASAL specimens only), is one component of a comprehensive MRSA colonization surveillance program. It is not intended to diagnose MRSA infection nor to guide or monitor treatment for MRSA infections. Performed at [facility], [place] [address] [place], [place] [address]     No results for input(s): LIPASE, AMYLASE in the last 168 hours. No results for input(s): AMMONIA in the last 168 hours.  Cardiac Enzymes: No results for input(s): CKTOTAL, CKMB, CKMBINDEX, TROPONINI in the last 168 hours. BNP (last 3 results) Recent Labs    01/24/19 2228 02/04/19 0103 03/25/19 1313  BNP 1,570.0* 4,197.2* 2,007.2*    [place]   [facility] If 7PM-7AM, please contact night-coverage at [email or web address], Office  [phone]   07/07/2019, 1:52 PM  LOS: 7 days

## 2019-07-07 NOTE — Progress Notes (Signed)
Physical Therapy Treatment Patient Details Name: Christina [name] MRN: [id number] DOB: 01-10-1951 Today's Date: 07/07/2019    History of Present Illness Pt is a 69 y/o female admitted secondary to rectoal prolapse and anemia. PMH includes HTN, depression, CAD s/p stent, COPD, ESRD on HD TTS, and a fib. Pt also with significant Bil foot pain to be assessed via arterogram by vascular surgery this week.      PT Comments    Patient progressing with ambulation and with less pain in her feet this session.  She states plans for surgery on her feet tomorrow.  She may need reassessment at that time.  PT to follow up and update POC/recommendations as needed.    Follow Up Recommendations  SNF;Supervision/Assistance - 24 hour     Equipment Recommendations  Rolling walker with 5" wheels;Wheelchair (measurements PT);Wheelchair cushion (measurements PT)    Recommendations for Other Services       Precautions / Restrictions Precautions Precautions: Fall    Mobility  Bed Mobility Overal bed mobility: Modified Independent             General bed mobility comments: up to sit EOB no assist  Transfers Overall transfer level: Needs assistance Equipment used: Rolling walker (2 wheeled) Transfers: Sit to/from Stand Sit to Stand: Min guard         General transfer comment: up to stand at [facility] with A for balance/safety  Ambulation/Gait Ambulation/Gait assistance: Min guard Gait Distance (Feet): 90 Feet Assistive device: Rolling walker (2 wheeled) Gait Pattern/deviations: Step-through pattern;Decreased stride length;Antalgic     General Gait Details: decreased stance time R   Stairs             Wheelchair Mobility    Modified Rankin (Stroke Patients Only)       Balance Overall balance assessment: Needs assistance   Sitting balance-Leahy Scale: Good     Standing balance support: Bilateral upper extremity supported;No upper extremity supported Standing balance-Leahy  Scale: Poor Standing balance comment: can stand without UE support briefly when transitioning to hands on chair                            Cognition Arousal/Alertness: Awake/alert Behavior During Therapy: WFL for tasks assessed/performed Overall Cognitive Status: Within Functional Limits for tasks assessed                                        Exercises Other Exercises Other Exercises: seated ankle pumps and LAQ x 10    General Comments General comments (skin integrity, edema, etc.): VSS with ambulation on RA      Pertinent Vitals/Pain Faces Pain Scale: Hurts little more Pain Location: R foot with ambulation Pain Descriptors / Indicators: Grimacing Pain Intervention(s): Monitored during session;Repositioned    Home Living                      Prior Function            PT Goals (current goals can now be found in the care plan section) Progress towards PT goals: Progressing toward goals    Frequency    Min 2X/week      PT Plan Current plan remains appropriate    Co-evaluation              AM-PAC PT "6 Clicks" Mobility   Outcome Measure  Help needed turning from your back to your side while in a flat bed without using bedrails?: None Help needed moving from lying on your back to sitting on the side of a flat bed without using bedrails?: None Help needed moving to and from a bed to a chair (including a wheelchair)?: A Little Help needed standing up from a chair using your arms (e.g., wheelchair or bedside chair)?: A Little Help needed to walk in hospital room?: A Little Help needed climbing 3-5 steps with a railing? : A Lot 6 Click Score: 19    End of Session   Activity Tolerance: Patient tolerated treatment well Patient left: in chair;with call bell/phone within reach   PT Visit Diagnosis: Muscle weakness (generalized) (M62.81);Difficulty in walking, not elsewhere classified (R26.2);Pain Pain - Right/Left:  Right Pain - part of body: Ankle and joints of foot     Time: 2951-8841 PT Time Calculation (min) (ACUTE ONLY): 19 min  Charges:  $Gait Training: 8-22 mins                     Magda Kiel, PT Acute Rehabilitation Services Pager:[phone] Office:[phone] 07/07/2019    [name] 07/07/2019, 5:09 PM

## 2019-07-07 NOTE — Progress Notes (Addendum)
Progress Note    07/07/2019 8:54 AM 2 Days Post-Op  Subjective:  Comfortable currently.  She reports pain in feet is unchanged.  EGD 6/8 - No aspirin, ibuprofen, naproxen, or other                            non-steroidal anti-inflammatory drugs long term. EC                            ASA 81 mg po qd is OK to start in 3 days if                            recommended by her physicians.                           - Await pathology results.                           - Resume Plavix (clopidogrel) at prior dose in 3                            days as indicated by her physicians. Refer to                            managing physician for further adjustment of therapy  Vitals:   07/07/19 0844 07/07/19 0845  BP: (!) 163/59 (!) 163/59  Pulse:  60  Resp:    Temp:    SpO2:      Physical Exam: Cardiac:  RRR Lungs:  CTAB Extremities:  Feet are warm.  Ischemic changes to toes unchanged. Motor function and sensation preserved   CBC    Component Value Date/Time   WBC 5.5 07/07/2019 0434   RBC 3.59 (L) 07/07/2019 0434   HGB 10.7 (L) 07/07/2019 0434   HCT 34.7 (L) 07/07/2019 0434   PLT 296 07/07/2019 0434   MCV 96.7 07/07/2019 0434   MCH 29.8 07/07/2019 0434   MCHC 30.8 07/07/2019 0434   RDW 17.1 (H) 07/07/2019 0434   LYMPHSABS 0.9 06/29/2019 1610   MONOABS 0.5 06/29/2019 1610   EOSABS 0.1 06/29/2019 1610   BASOSABS 0.1 06/29/2019 1610    BMET    Component Value Date/Time   NA 137 07/07/2019 0434   K 3.5 07/07/2019 0434   CL 101 07/07/2019 0434   CO2 25 07/07/2019 0434   GLUCOSE 91 07/07/2019 0434   BUN 11 07/07/2019 0434   CREATININE 3.04 (H) 07/07/2019 0434   CALCIUM 8.4 (L) 07/07/2019 0434   GFRNONAA 15 (L) 07/07/2019 0434   GFRAA 17 (L) 07/07/2019 0434     Intake/Output Summary (Last 24 hours) at 07/07/2019 0854 Last data filed at 07/07/2019 0802 Gross per 24 hour  Intake 480 ml  Output 2250 ml  Net -1770 ml    HOSPITAL MEDICATIONS Scheduled Meds: .  allopurinol  100 mg Oral Daily  . amiodarone  200 mg Oral BID  . calcitRIOL  1 mcg Oral Q M,W,F-HD  . Chlorhexidine Gluconate Cloth  6 each Topical Q0600  . cholecalciferol  2,000 Units Oral Daily  . darbepoetin (ARANESP) injection - DIALYSIS  200 mcg Intravenous Q Sat-HD  . dicyclomine  10 mg Oral QHS  .  diltiazem  120 mg Oral Daily  . docusate sodium  100 mg Oral BID  . feeding supplement (NEPRO CARB STEADY)  237 mL Oral BID BM  . ferric citrate  630 mg Oral TID WC  . haloperidol lactate  4 mg Intravenous Once  . isosorbide mononitrate  30 mg Oral QHS  . LORazepam  1 mg Intravenous Once  . metoprolol tartrate  50 mg Oral BID  . mometasone-formoterol  2 puff Inhalation BID  . pantoprazole  40 mg Oral Q12H  . pravastatin  80 mg Oral QPM  . sodium chloride flush  3 mL Intravenous Q12H  . sucralfate  1 g Oral Q6H   Continuous Infusions: . ferric gluconate (FERRLECIT/NULECIT) IV Stopped (07/06/19 1101)   PRN Meds:.acetaminophen **OR** acetaminophen, clobetasol cream, diphenhydrAMINE, haloperidol **OR** haloperidol lactate, hydrALAZINE, hydrALAZINE, ipratropium-albuterol, nitroGLYCERIN, ondansetron, oxyCODONE-acetaminophen, pentafluoroprop-tetrafluoroeth, polyethylene glycol, senna-docusate, sodium chloride flush  Assessment: HAL:[id number] bilateral foot rest pain. Ischemic changes to toes without gangrene.  UGIB: Total of 4u PRBCs. Last transfused on 6/4.  Hgb has stablized. EGD done Tuesday. No stigmata of bleeding. OK to use Plavix/asa if needed after tomorrow per GI  Plan: -Need to review with Dr. Donnetta Hutching when to proceed with arteriogram. She dialyzes as outpt on TTS  Risa Grill, PA-C [facility] [phone] 07/07/2019  8:54 AM   I have examined the patient, reviewed and agree with above.  Patient is hemodynamically stable and no further issues regarding her GI bleeding.  No change in Chronic ischemia both feet.  Discussed this again with patient.  May  not have options for revascularization.  We will proceed with aortogram bilateral lower extremity runoff tomorrow with possible intervention  Curt Jews, MD 07/07/2019 11:23 AM

## 2019-07-08 ENCOUNTER — Encounter (HOSPITAL_COMMUNITY)
Admission: EM | Disposition: A | Discharge status: Skilled Nursing Facility | Payer: Self-pay | Source: Home / Self Care | Attending: Family Medicine

## 2019-07-08 DIAGNOSIS — I70245 Atherosclerosis of native arteries of left leg with ulceration of other part of foot: Secondary | ICD-10-CM

## 2019-07-08 DIAGNOSIS — I70221 Atherosclerosis of native arteries of extremities with rest pain, right leg: Secondary | ICD-10-CM

## 2019-07-08 HISTORY — PX: PERIPHERAL VASCULAR INTERVENTION: CATH118257

## 2019-07-08 HISTORY — PX: ABDOMINAL AORTOGRAM W/LOWER EXTREMITY: CATH118223

## 2019-07-08 LAB — RENAL FUNCTION PANEL
Albumin: 2.1 g/dL — ABNORMAL LOW (ref 3.5–5.0)
Anion gap: 11 (ref 5–15)
BUN: 23 mg/dL (ref 8–23)
CO2: 23 mmol/L (ref 22–32)
Calcium: 8.6 mg/dL — ABNORMAL LOW (ref 8.9–10.3)
Chloride: 103 mmol/L (ref 98–111)
Creatinine, Ser: 4.87 mg/dL — ABNORMAL HIGH (ref 0.44–1.00)
GFR calc Af Amer: 10 mL/min — ABNORMAL LOW (ref >60)
GFR calc non Af Amer: 8 mL/min — ABNORMAL LOW (ref >60)
Glucose, Bld: 93 mg/dL (ref 70–99)
Phosphorus: 5 mg/dL — ABNORMAL HIGH (ref 2.5–4.6)
Potassium: 3.8 mmol/L (ref 3.5–5.1)
Sodium: 137 mmol/L (ref 135–145)

## 2019-07-08 LAB — CBC
HCT: 32.8 % — ABNORMAL LOW (ref 36.0–46.0)
HCT: 34.1 % — ABNORMAL LOW (ref 36.0–46.0)
Hemoglobin: 10.4 g/dL — ABNORMAL LOW (ref 12.0–15.0)
Hemoglobin: 10.5 g/dL — ABNORMAL LOW (ref 12.0–15.0)
MCH: 30.2 pg (ref 26.0–34.0)
MCH: 29.7 pg (ref 26.0–34.0)
MCHC: 31.7 g/dL (ref 30.0–36.0)
MCHC: 30.8 g/dL (ref 30.0–36.0)
MCV: 95.3 fL (ref 80.0–100.0)
MCV: 96.3 fL (ref 80.0–100.0)
Platelets: 275 10*3/uL (ref 150–400)
Platelets: 279 10*3/uL (ref 150–400)
RBC: 3.44 MIL/uL — ABNORMAL LOW (ref 3.87–5.11)
RBC: 3.54 MIL/uL — ABNORMAL LOW (ref 3.87–5.11)
RDW: 17.2 % — ABNORMAL HIGH (ref 11.5–15.5)
RDW: 17 % — ABNORMAL HIGH (ref 11.5–15.5)
WBC: 5.7 10*3/uL (ref 4.0–10.5)
WBC: 7.5 10*3/uL (ref 4.0–10.5)
nRBC: 0 % (ref 0.0–0.2)
nRBC: 0 % (ref 0.0–0.2)

## 2019-07-08 LAB — POCT ACTIVATED CLOTTING TIME
Activated Clotting Time: 180 seconds
Activated Clotting Time: 197 seconds
Activated Clotting Time: 169 seconds

## 2019-07-08 SURGERY — ABDOMINAL AORTOGRAM W/LOWER EXTREMITY
Anesthesia: LOCAL | Laterality: Left

## 2019-07-08 MED ORDER — CLOPIDOGREL BISULFATE 300 MG PO TABS
ORAL_TABLET | ORAL | Status: DC | PRN
  Administered 2019-07-08: 75 mg via ORAL

## 2019-07-08 MED ORDER — SODIUM CHLORIDE 0.9 % IV SOLN
125.0000 mg | Freq: Once | INTRAVENOUS | Status: AC
  Administered 2019-07-10: 125 mg via INTRAVENOUS
  Filled 2019-07-08: qty 10

## 2019-07-08 MED ORDER — LABETALOL HCL 5 MG/ML IV SOLN
10.0000 mg | INTRAVENOUS | Status: DC | PRN
  Administered 2019-07-08 – 2019-07-12 (×2): 10 mg via INTRAVENOUS

## 2019-07-08 MED ORDER — PENTAFLUOROPROP-TETRAFLUOROETH EX AERO: 1.0000 "application " | INHALATION_SPRAY | CUTANEOUS | Status: DC | PRN

## 2019-07-08 MED ORDER — CHLORHEXIDINE GLUCONATE CLOTH 2 % EX PADS
6.0000 | MEDICATED_PAD | Freq: Every day | CUTANEOUS | Status: DC
  Administered 2019-07-08 – 2019-07-11 (×4): 6 via TOPICAL

## 2019-07-08 MED ORDER — MIDAZOLAM HCL 2 MG/2ML IJ SOLN
INTRAMUSCULAR | Status: AC
  Filled 2019-07-08: qty 2

## 2019-07-08 MED ORDER — ASPIRIN EC 81 MG PO TBEC
81.0000 mg | DELAYED_RELEASE_TABLET | Freq: Every day | ORAL | Status: DC
  Administered 2019-07-09 – 2019-07-14 (×6): 81 mg via ORAL
  Filled 2019-07-08 (×6): qty 1

## 2019-07-08 MED ORDER — LIDOCAINE HCL (PF) 1 % IJ SOLN
INTRAMUSCULAR | Status: DC | PRN
  Administered 2019-07-08: 15 mL

## 2019-07-08 MED ORDER — DARBEPOETIN ALFA 150 MCG/0.3ML IJ SOSY
150.0000 ug | PREFILLED_SYRINGE | INTRAMUSCULAR | Status: DC
  Filled 2019-07-08: qty 0.3

## 2019-07-08 MED ORDER — FENTANYL CITRATE (PF) 100 MCG/2ML IJ SOLN
INTRAMUSCULAR | Status: AC
  Filled 2019-07-08: qty 2

## 2019-07-08 MED ORDER — SODIUM CHLORIDE 0.9% FLUSH
3.0000 mL | Freq: Two times a day (BID) | INTRAVENOUS | Status: DC
  Administered 2019-07-08 – 2019-07-12 (×8): 3 mL via INTRAVENOUS

## 2019-07-08 MED ORDER — ONDANSETRON HCL 4 MG/2ML IJ SOLN: 4.0000 mg | Freq: Four times a day (QID) | INTRAMUSCULAR | Status: DC | PRN

## 2019-07-08 MED ORDER — FENTANYL CITRATE (PF) 100 MCG/2ML IJ SOLN
INTRAMUSCULAR | Status: DC | PRN
  Administered 2019-07-08: 25 ug via INTRAVENOUS
  Administered 2019-07-08: 50 ug via INTRAVENOUS
  Administered 2019-07-08: 75 ug via INTRAVENOUS
  Administered 2019-07-08: 50 ug via INTRAVENOUS

## 2019-07-08 MED ORDER — CLOPIDOGREL BISULFATE 75 MG PO TABS
ORAL_TABLET | ORAL | Status: AC
  Filled 2019-07-08: qty 1

## 2019-07-08 MED ORDER — LIDOCAINE HCL (PF) 1 % IJ SOLN: 5.0000 mL | INTRAMUSCULAR | Status: DC | PRN

## 2019-07-08 MED ORDER — HEPARIN SODIUM (PORCINE) 1000 UNIT/ML IJ SOLN
INTRAMUSCULAR | Status: DC | PRN
  Administered 2019-07-08: 2000 [IU] via INTRAVENOUS
  Administered 2019-07-08: 4500 [IU] via INTRAVENOUS

## 2019-07-08 MED ORDER — SODIUM CHLORIDE 0.9 % IV SOLN: 250.0000 mL | INTRAVENOUS | Status: DC | PRN

## 2019-07-08 MED ORDER — HYDRALAZINE HCL 20 MG/ML IJ SOLN
INTRAMUSCULAR | Status: AC
  Filled 2019-07-08: qty 1

## 2019-07-08 MED ORDER — HYDRALAZINE HCL 20 MG/ML IJ SOLN
INTRAMUSCULAR | Status: DC | PRN
  Administered 2019-07-08 (×2): 10 mg via INTRAVENOUS

## 2019-07-08 MED ORDER — ALTEPLASE 2 MG IJ SOLR: 2.0000 mg | Freq: Once | INTRAMUSCULAR | Status: DC | PRN

## 2019-07-08 MED ORDER — IODIXANOL 320 MG/ML IV SOLN
INTRAVENOUS | Status: DC | PRN
  Administered 2019-07-08: 200 mL via INTRA_ARTERIAL

## 2019-07-08 MED ORDER — LIDOCAINE HCL (PF) 1 % IJ SOLN
INTRAMUSCULAR | Status: AC
  Filled 2019-07-08: qty 30

## 2019-07-08 MED ORDER — LABETALOL HCL 5 MG/ML IV SOLN
INTRAVENOUS | Status: AC
  Filled 2019-07-08: qty 4

## 2019-07-08 MED ORDER — LIDOCAINE-PRILOCAINE 2.5-2.5 % EX CREA
1.0000 "application " | TOPICAL_CREAM | CUTANEOUS | Status: DC | PRN
  Filled 2019-07-08: qty 5

## 2019-07-08 MED ORDER — HEPARIN SODIUM (PORCINE) 1000 UNIT/ML DIALYSIS: 1000.0000 [IU] | INTRAMUSCULAR | Status: DC | PRN

## 2019-07-08 MED ORDER — HYDRALAZINE HCL 20 MG/ML IJ SOLN
5.0000 mg | INTRAMUSCULAR | Status: DC | PRN
  Administered 2019-07-09: 5 mg via INTRAVENOUS
  Filled 2019-07-08: qty 1

## 2019-07-08 MED ORDER — ACETAMINOPHEN 325 MG PO TABS: 650.0000 mg | ORAL_TABLET | ORAL | Status: DC | PRN

## 2019-07-08 MED ORDER — SODIUM CHLORIDE 0.9 % IV SOLN: 100.0000 mL | INTRAVENOUS | Status: DC | PRN

## 2019-07-08 MED ORDER — MIDAZOLAM HCL 2 MG/2ML IJ SOLN
INTRAMUSCULAR | Status: DC | PRN
  Administered 2019-07-08 (×4): 1 mg via INTRAVENOUS

## 2019-07-08 MED ORDER — HEPARIN SODIUM (PORCINE) 1000 UNIT/ML IJ SOLN
INTRAMUSCULAR | Status: AC
  Filled 2019-07-08: qty 1

## 2019-07-08 MED ORDER — OXYCODONE-ACETAMINOPHEN 5-325 MG PO TABS
ORAL_TABLET | ORAL | Status: AC
  Filled 2019-07-08: qty 1

## 2019-07-08 MED ORDER — CLOPIDOGREL BISULFATE 75 MG PO TABS
75.0000 mg | ORAL_TABLET | Freq: Every day | ORAL | Status: DC
  Administered 2019-07-09 – 2019-07-14 (×6): 75 mg via ORAL
  Filled 2019-07-08 (×7): qty 1

## 2019-07-08 MED ORDER — HEPARIN (PORCINE) IN NACL 1000-0.9 UT/500ML-% IV SOLN
INTRAVENOUS | Status: AC
  Filled 2019-07-08: qty 1000

## 2019-07-08 MED ORDER — METOPROLOL TARTRATE 5 MG/5ML IV SOLN
INTRAVENOUS | Status: AC
  Filled 2019-07-08: qty 5

## 2019-07-08 MED ORDER — HEPARIN (PORCINE) IN NACL 1000-0.9 UT/500ML-% IV SOLN
INTRAVENOUS | Status: DC | PRN
  Administered 2019-07-08 (×2): 500 mL

## 2019-07-08 MED ORDER — SODIUM CHLORIDE 0.9% FLUSH: 3.0000 mL | INTRAVENOUS | Status: DC | PRN

## 2019-07-08 SURGICAL SUPPLY — 29 items
BALLN IN.PACT DCB 4X40 (BALLOONS) ×3
BALLN MUSTANG 7.0X40 75 (BALLOONS) ×3
BALLOON MUSTANG 7.0X40 75 (BALLOONS) IMPLANT
CATH OMNI FLUSH 5F 65CM (CATHETERS) ×1 IMPLANT
CATH QUICKCROSS SUPP .018X90CM (MICROCATHETER) ×1 IMPLANT
CATH SHOCKWAVE S4 4.0X40 (CATHETERS) ×1 IMPLANT
CATH SOFT-VU 4F 65 STRAIGHT (CATHETERS) IMPLANT
CATH SOFT-VU STRAIGHT 4F 65CM (CATHETERS) ×3
DCB IN.PACT 4X40 (BALLOONS) IMPLANT
DCB RANGER 6.0X40 135 (BALLOONS) IMPLANT
GUIDEWIRE ANGLED .035X260CM (WIRE) ×1 IMPLANT
GUIDEWIRE ZILIENT 6G 014 (WIRE) ×2 IMPLANT
KIT ENCORE 26 ADVANTAGE (KITS) ×1 IMPLANT
KIT MICROPUNCTURE NIT STIFF (SHEATH) ×1 IMPLANT
KIT PV (KITS) ×3 IMPLANT
RANGER DCB 6.0X40 135 (BALLOONS) ×3
SHEATH PINNACLE 5F 10CM (SHEATH) ×1 IMPLANT
SHEATH PINNACLE 7F 10CM (SHEATH) ×1 IMPLANT
SHEATH PINNACLE ST 6F 45CM (SHEATH) ×1 IMPLANT
SHEATH PROBE COVER 6X72 (BAG) ×1 IMPLANT
STENT ABSOLUTE PRO 8X40X135 (Permanent Stent) ×1 IMPLANT
STENT OMNILINK ELITE 8X19X80 (Permanent Stent) ×1 IMPLANT
SYR MEDRAD MARK V 150ML (SYRINGE) ×1 IMPLANT
TAPE VIPERTRACK RADIOPAQ (MISCELLANEOUS) IMPLANT
TAPE VIPERTRACK RADIOPAQUE (MISCELLANEOUS) ×3
TRANSDUCER W/STOPCOCK (MISCELLANEOUS) ×3 IMPLANT
TRAY PV CATH (CUSTOM PROCEDURE TRAY) ×3 IMPLANT
WIRE BENTSON .035X145CM (WIRE) ×1 IMPLANT
WIRE HITORQ VERSACORE ST 145CM (WIRE) ×1 IMPLANT

## 2019-07-08 NOTE — Progress Notes (Signed)
Subjective: Patient seen, came back from the OR.  Still having bleeding at the site of arteriogram.  Vascular surgery team in the room.  Patient not examined  Vitals:   07/08/19 1709 07/08/19 1716  BP: (!) 143/59 (!) 153/57  Pulse: (!) 58   Resp:    Temp: 97.9 F (36.6 C)   SpO2: 97%       A/P S/p abdominal aortogram Shockwave intra-arterial lithotripsy left superficial femoral artery, left common iliac artery Balloon angioplasty left superficial femoral artery, left common iliac artery Stent placement in left common iliac artery, left external iliac artery GI bleed ESRD on hemodialysis Paroxysmal atrial fibrillation  We will follow in a.m.    [place] [facility] [phone]

## 2019-07-08 NOTE — Progress Notes (Signed)
Patient off the floor at 9:30am for procedure. Patient belongings left in room,. If patient does not come back to this floor we wil need to transfer those belongings to where she is at the time I receive any notification

## 2019-07-08 NOTE — Op Note (Addendum)
Patient name: Christina [name] MRN: [id number] DOB: Jun 10, 1950 Sex: female  07/08/2019 Pre-operative Diagnosis: Severe bilateral PAD Post-operative diagnosis:  Same Surgeon:  Annamarie Major Procedure Performed:  1.  Ultrasound-guided access, right femoral artery  2.  Abdominal aortogram  3.  Bilateral lower extremity runoff  4.  Shockwave intra-arterial lithotripsy, left superficial femoral artery  5.  Shockwave intra-arterial lithotripsy, left common iliac artery  6.  Drug-coated balloon angioplasty, left superficial femoral artery, left common iliac artery  7.  Stent, Left common iliac artery  8.  Stent, left external iliac artery  9.  Conscious sedation 122 minutes   Indications: The patient has severe bilateral atherosclerotic vascular disease with ulceration of the left toes and rest pain.  She is here today for further evaluation, possible intervention  Procedure:  The patient was identified in the holding area and taken to room 8.  The patient was then placed supine on the table and prepped and draped in the usual sterile fashion.  A time out was called.  Conscious sedation was administered with the use of IV fentanyl and Versed under continuous physician and nurse monitoring.  Heart rate, blood pressure, and oxygen saturation were continuously monitored.  Total sedation time was 122 minutes.  Ultrasound was used to evaluate the right common femoral artery.  It was patent .  A digital ultrasound image was acquired.  A micropuncture needle was used to access the right common femoral artery under ultrasound guidance.  An 018 wire was advanced without resistance and a micropuncture sheath was placed.  The 018 wire was removed and a benson wire was placed.  The micropuncture sheath was exchanged for a 5 french sheath.  An omniflush catheter was advanced over the wire to the level of L-1.  An abdominal angiogram was obtained.  Next, the catheter was pulled out of the aortic bifurcation and  bilateral runoff was performed. Findings:   Aortogram: Bilateral renal arteries are widely patent.  The infrarenal abdominal aorta is heavily calcified.  There are areas of hypodensity in the mid infrarenal abdominal aorta.  Pressure gradients across this area were checked and found to not be significant.  The right common iliac artery is heavily calcified and irregular however no hemodynamically significant stenosis was identified.  The left common iliac artery has approximately a 50% stenosis at its origin and a 70% stenosis in the midportion.  There was also a greater than 50% stenosis at the origin of the left external iliac artery  Right Lower Extremity: The right common femoral and profundofemoral artery heavily calcified but patent.  The superficial femoral artery is patent with a greater than 90% stenosis in the midportion as well as a approximate 50% stenosis in the adductor canal.  The anterior tibial is the dominant runoff across the ankle however it does have a abrupt cut off as it crosses the ankle.  The peroneal artery is patent throughout its course.  The posterior tibial artery is occluded  Left Lower Extremity: The left common femoral and profundofemoral artery are calcified but patent without stenosis.  The superficial femoral artery is small in caliber.  There is a greater than 90% stenosis in the adductor canal.  The popliteal artery is widely patent.  There is two-vessel runoff via the posterior tibial and peroneal artery.  The anterior tibial artery is occluded.  Intervention: After the above images were acquired the decision made to proceed with intervention.  A 6 French 45 cm sheath was advanced into  the left external iliac artery.  The patient was fully heparinized.  Using a 014 wire and a quick cross catheter the lesion in the left superficial femoral artery was crossed.  I then performed shockwave intra-arterial lithotripsy of this lesion for a total of 150 seconds using a 4 x 40  balloon.  I then performed drug-coated balloon angioplasty of this lesion using a 4 x 40 impact balloon.  The balloon was held for 2 atm.  Completion imaging showed a nonflow limiting dissection and also a small intimal flap.  I then reinserted the 4 x 40 balloon and repeated balloon angioplasty at a low atmospheres for 3 minutes.  Follow-up imaging showed near resolution of the intimal flap and improved appearance of the nonflow limiting dissection.  Next attention was turned towards the common iliac artery.  I performed shockwave intra-arterial lithotripsy of this heavily calcified lesion using the same 4 x 40 balloon.  A total of 120 seconds were performed.  I then used a Ranger 6 x 40 drug-coated balloon for 3 minutes at nominal pressure.  Completion imaging showed persistent stenosis in this area and so I elected to primarily stent this.  This was done with a Herculink 8 x 22.  Follow-up imaging showed resolution of the common iliac stenosis however now the origin of the external iliac artery appeared greater than 70% and I felt that it needed to be addressed and so this was stented using a Abbott self-expanding 8 x 40 stent which was postdilated with a 7 mm balloon.  Completion imaging showed resolution of the stenosis within the left iliac system.  Catheters and wires were removed.  The long 6 French sheath was exchanged out for a short 7 [place] sheath.  The patient be taken the holding area for sheath pull once her coagulation profile correct.  Impression:  #1 greater than 70% left common iliac artery stenosis treated with shockwave intra-arterial lithotripsy, drug-coated balloon, and ultimate stenting using an 8 x 22 balloon expandable stent.  #2 proximal left external iliac artery stenosis successfully treated using an 8 x 40 self-expanding stent  #3 near total occlusion of the superficial femoral artery on the left at the adductor canal treated using shockwave intra-arterial lithotripsy and  drug-coated balloon angioplasty with a 4 mm balloon.  #4 two-vessel runoff via the posterior tibial and peroneal artery on the left  #5 there are tandem lesions that are hemodynamically significant in the right superficial femoral artery with two-vessel runoff.  6.  The patient will be brought back for percutaneous intervention of the right leg and possible treatment of the ostial left common iliac lesion when she recovers from this procedure.   Theotis Burrow, M.D., [facility] [place] Office: [phone] Pager:  [phone]

## 2019-07-08 NOTE — Plan of Care (Signed)
  Problem: Education: Goal: Knowledge of General Education information will improve Description Including pain rating scale, medication(s)/side effects and non-pharmacologic comfort measures Outcome: Progressing   

## 2019-07-08 NOTE — Progress Notes (Addendum)
Pt arrived to rm 16 from cath lab. Pt alert. CHG and assessment performed.  Initiated tele. Pt starting bleeding on R groin after moving her to the bed from stretcher. 4x4 gauze applied on R groinl  Initial VSS stable. dosalis pedis and tial pulses doppable.  Will continue to monitor pt VS and groin.    Updated: t@ 5015 the bleeding site was reinforced with gauzes and tapes per MD order. Tibial and pedis pulses doppable. Skin around the groin soft. Pt lying flat over the night per MD. VSS. Pt alert and oriented.  Lavenia Atlas, RN

## 2019-07-08 NOTE — Progress Notes (Signed)
[facility] Progress Note   Dialysis Orders:  [facility] TTS 4 hr EDW 48 kgis ?? F180 BFR 350 mL/ min DFR A1.5 UF profile 2no heparin Venofer 100 mg q rx x 9, started 5/27 Mircera 200 q 2 weeks, last given 06/02/19 Calcitriol 1.0 cmg q rx  Assessment/Plan: 1.Recurrent GIB - melanoic stools on HD with abdominal pain -s/p 4 units PRBC GI following - presumed bleeding from previously diagnosed severe duodenal ulcerations, recent acute GIB in May with multiple deep duodenal ulcers - PPI indefinitely - On PPI bid    Repeat EGD 6/8 showed multiple plagues in the mid and distal esophagus, smal HH, two non-bleeding duodenal ulcers wtihout stigmata of bleeding, duodenal deformity, erythematous duodenopathy ,to resume plavix;  continue no heparin hgb stable  2. ESRD- TTS AVF using two needles without problem- 17 gauge - plan ^ to 16 for Saturday.Adella Hare and Wed of this week - high patient load today.  For arteriogram today- volume greatly improved so I think we can hold HD until Saturday and get back on schedule. K 3.5 6/10 - Hd orders written for Saturday with AVF 3. Anemia- Acute on chronic 2/2  #1. hgb stabilized (6.8 > 7.2>9 6/4> 9.8 6/5> 9.7 6/6> 10.4 6/11-Aranesp 200 given 6/5 - s/p course of Fe. Titrate dose of Aranesp to 150 for Saturday 4. Secondary hyperparathyroidism- resumed calcitriol, Ca and phos in goal.  5.HTN/volumeVolume status improving. Volume overloaded on admit. Generalized edema - EDW was raised from 40.5 >48 after last admission - post wt prior to last admission was 44 at the most - need to tritrate EDW back down some what.-net UF 4 L 6/5 with post wt 50.3 -net UF 2.97 6/7 - post wt not done - 49.2 was pre weight - so calculated post wt would be 46.7.   Net UF 2 L 6/9 with post wt 45 kg. Resumed home dilt 120 daily , cont metoprolol.- Will have lower EDW for d/c  6. Nutrition-alb very lowchange to renal diet, protein supplements, vit 7.  Mesenteric ischemia s/p SMA stent 05/2019 8.PAD -ischemic left 4th and 5th toesDr Early seeing- planning for arteriogram today with possible intervention 9. Atrial fibrillation -- on amiodarone,in NSR 10. Hx cocaine abuse - UDS negative  11. 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 DNR 13. Disp -tentative SNF  Jannifer Hick MD [facility] Pager [phone]  Subjective:   No new issues - hopes to get out of here soon. Please with improvement in LE edema  Objective Vitals:   07/07/19 2100 07/08/19 0017 07/08/19 0405 07/08/19 0748  BP: (!) 151/61 (!) 141/61 (!) 155/62 (!) 178/61  Pulse: 64 (!) 53 (!) 56 (!) 56  Resp: 13 18 18 12   Temp: 98 F (36.7 C) 98 F (36.7 C) 98.2 F (36.8 C) 98.1 F (36.7 C)  TempSrc: Oral Oral Oral Oral  SpO2: 98% 99% 96% 97%  Weight:      Height:       Physical Exam General: NAD spirits good sitting on side of bed Heart: RRR Lungs: no rales Abdomen:  Soft NT Extremities: 1 = pedal edema toes/feet tender,  Dialysis Access:  [facility] and left upper AVF + bruit    Additional Objective Labs: Basic Metabolic Panel: Recent Labs  Lab 07/02/19 0719 07/02/19 0719 07/04/19 1023 07/06/19 0757 07/07/19 0434  NA 133*   < > 136 136 137  K 5.2*   < > 4.0 4.0 3.5  CL 99   < > 102 102 101  CO2 20*   < > 24 24 25   GLUCOSE 110*   < > 125* 88 91  BUN 64*   < > 44* 27* 11  CREATININE 5.19*   < > 5.05* 4.25* 3.04*  CALCIUM 8.0*   < > 8.6* 8.5* 8.4*  PHOS 5.3*  --   --  3.4  --    < > = values in this interval not displayed.   Liver Function Tests: Recent Labs  Lab 07/02/19 0719 07/06/19 0757  ALBUMIN 2.0* 2.0*   No results for input(s): LIPASE, AMYLASE in the last 168 hours. CBC: Recent Labs  Lab 07/04/19 1023 07/04/19 1023 07/05/19 0601 07/05/19 0601 07/06/19 0450 07/07/19 0434 07/08/19 0500  WBC 8.0   < > 7.5   < > 6.8 5.5 5.7  HGB 10.2*   < > 10.1*   < > 9.7* 10.7* 10.4*  HCT 32.2*   < > 31.0*   < >  31.0* 34.7* 32.8*  MCV 94.4  --  93.1  --  95.4 96.7 95.3  PLT 281   < > 243   < > 271 296 275   < > = values in this interval not displayed.   Blood Culture    Component Value Date/Time   SDES BLOOD RIGHT HAND 06/17/2019 1223   [place]  06/17/2019 1223    BOTTLES DRAWN AEROBIC ONLY Blood Culture results may not be optimal due to an inadequate volume of blood received in culture bottles   CULT  06/17/2019 1223    NO GROWTH 5 DAYS Performed at [facility], [place] [address], [place], [place] [address]    REPTSTATUS 06/22/2019 FINAL 06/17/2019 1223    Cardiac Enzymes: No results for input(s): CKTOTAL, CKMB, CKMBINDEX, TROPONINI in the last 168 hours. CBG: No results for input(s): GLUCAP in the last 168 hours. Iron Studies: No results for input(s): IRON, TIBC, TRANSFERRIN, FERRITIN in the last 72 hours. Lab Results  Component Value Date   INR 1.2 06/14/2019   INR 1.1 03/25/2019   INR 0.9 01/26/2019   Studies/Results: No results found. Medications: . ferric gluconate (FERRLECIT/NULECIT) IV Stopped (07/06/19 1101)   . allopurinol  100 mg Oral Daily  . amiodarone  200 mg Oral BID  . calcitRIOL  1 mcg Oral Q M,W,F-HD  . Chlorhexidine Gluconate Cloth  6 each Topical Q0600  . cholecalciferol  2,000 Units Oral Daily  . darbepoetin (ARANESP) injection - DIALYSIS  200 mcg Intravenous Q Sat-HD  . dicyclomine  10 mg Oral QHS  . diltiazem  120 mg Oral Daily  . docusate sodium  100 mg Oral BID  . feeding supplement (NEPRO CARB STEADY)  237 mL Oral BID BM  . ferric citrate  630 mg Oral TID WC  . haloperidol lactate  4 mg Intravenous Once  . isosorbide mononitrate  30 mg Oral QHS  . LORazepam  1 mg Intravenous Once  . metoprolol tartrate  50 mg Oral BID  . mometasone-formoterol  2 puff Inhalation BID  . pantoprazole  40 mg Oral Q12H  . pravastatin  80 mg Oral QPM  . sodium chloride flush  3 mL Intravenous Q12H  . sucralfate  1 g Oral Q6H

## 2019-07-08 NOTE — Progress Notes (Addendum)
Called to patient's room due to bed alarm activated. Patient found sitting on side of bed. Patient had been educated to be on bed rest and to call if she needed to get up. Patient laided back into bed due to her refusal to lay back down. This RN noted that blood was trickling down patient's thigh. When the cath site observed. Profuse bleeding from groin site. Manual pressured applied until hemostasis achived apprx. 15 minutes. Pressure dressing applied and patient voiced undestanding toremain flat and to call if she needed anything. Pure wick applied.

## 2019-07-08 NOTE — Progress Notes (Signed)
Site area: right groin  Site Prior to Removal:  Level 0  Pressure Applied For 35 MINUTES    Minutes Beginning at 1640  Manual:   Yes.    Patient Status During Pull:  Stable  Post Pull Groin Site:  Level 0  Post Pull Instructions Given:  Yes.    Post Pull Pulses Present:  Yes.    Dressing Applied:  Yes.    Comments:  Bed rest started at1620 X 4 hr.

## 2019-07-09 NOTE — Progress Notes (Signed)
[facility]  PROGRESS NOTE  Christina [name] AST:[id number] DOB: 11-24-1950 DOA: 06/29/2019 PCP: Maggie Schwalbe, PA-C   Brief HPI:   69 year old female with medical history significant of hypertension, hyperlipidemia, CAD, atrial fibrillation, COPD, ESRD on HD TTS, SBO, rectal prolapse, polysubstance abuse including cocaine who initially presented with complaints of rectal pain aggressively worsening over the past 4 days.  Patient missed her hemodialysis due to pain.  She is followed by Dr. Oneida Alar vascular surgery who has been contemplating amputating her toes.  Patient has had several bloody bowel movements while she was in the hospital.  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  She was hospitalized from 05/10/2019 to 05/12/2019 with acute respiratory failure secondary to volume overload and COPD exacerbation.  4/25-4/27 at [facility] for atrial fibrillation with RVR, no longer on anticoagulation due to large duodenal ulcer.  5/8-5/9, hospitalized for peripheral arterial disease and gangrene of left foot for which she was discharged to follow with Dr. Oneida Alar.  Lastly hospitalized from 5/18-5/24 with GI bleed found to have several deep duodenal ulcers without underlying active sign of bleeding. Patient was found to be hypotensive on admission with BP 76/52, which responded to PRBC.  But she was continued to have melena during hospital stay.  She was seen by GI, hemoglobin stabilized after units PRBC.  Plan for endoscopy per GI and arteriogram per vascular surgery.   Subjective   Patient seen and examined, denies any complaints.  S/p vascular stent placement and left common iliac artery, left external iliac artery.   Assessment/Plan:     1. GI bleed/acute blood loss anemia-endoscopy showed duodenal  ulcer with esophageal plaques.  Biopsies are pending.  Patient was transfused 3 units PRBC.  Hemoglobin is stable at 9.7.  Aspirin and Plavix are currently on hold.  It can be resumed in 3 days per GI if indicated.  She will need antiplatelet therapy pending vascular surgery evaluation.  Continue PPI. 2. Peripheral vascular disease with ischemic changes without gangrene-s/p stent placement in left common iliac artery, left external iliac artery vascular surgery following.  Continue aspirin and Plavix 3. Delirium-resolved, likely at baseline.  Patient had multiple episodes of delirium for which she required IM Haldol.  We will continue to monitor. 4. ESRD on hemodialysis-nephrology following. 5. Rectal prolapse-acute on chronic, lidocaine jelly as needed. 6. Paroxysmal atrial fibrillation-she is not on anticoagulation due to history of GI bleed, continue amiodarone, metoprolol. 7. COPD without acute exacerbation-continue Dulera, as needed DuoNebs. 8. Hyperlipidemia-continue pravastatin. 9. Hypertension-blood pressure was elevated, dose of metoprolol was changed to 50 mg twice a day.     SpO2: 100 % O2 Flow Rate (L/min): 2 L/min   COVID-19 Labs  No results for input(s): DDIMER, FERRITIN, LDH, CRP in the last 72 hours.  Lab Results  Component Value Date   SARSCOV2NAA NEGATIVE 06/29/2019   [place] NEGATIVE 06/14/2019   [place] NEGATIVE 05/10/2019   [place] NEGATIVE 04/23/2019     CBG: No results for input(s): GLUCAP in the last 168 hours.  CBC: Recent Labs  Lab 07/05/19 0601 07/06/19 0450 07/07/19 0434 07/08/19 0500 07/08/19 1902  WBC 7.5 6.8 5.5 5.7 7.5  HGB 10.1* 9.7* 10.7* 10.4* 10.5*  HCT 31.0* 31.0* 34.7* 32.8* 34.1*  MCV 93.1 95.4 96.7 95.3 96.3  PLT 243 271 296 275 279  Basic Metabolic Panel: Recent Labs  Lab 07/04/19 1023 07/06/19 0757 07/07/19 0434 07/08/19 1902  NA 136 136 137 137  K 4.0 4.0 3.5 3.8  CL 102 102 101 103  CO2 24 24 25 23    GLUCOSE 125* 88 91 93  BUN 44* 27* 11 23  CREATININE 5.05* 4.25* 3.04* 4.87*  CALCIUM 8.6* 8.5* 8.4* 8.6*  PHOS  --  3.4  --  5.0*     Liver Function Tests: Recent Labs  Lab 07/06/19 0757 07/08/19 1902  ALBUMIN 2.0* 2.1*    DVT prophylaxis: SCDs  Code Status: Full code  Family Communication: No family at bedside    Status is: Inpatient  Dispo: The patient is from: Home              Anticipated d/c is to: Skilled nursing facility              Anticipated d/c date is: 07/09/19              Patient currently not medically stable for discharge  Barrier to discharge-arteriogram has been planned for tomorrow.     Scheduled medications:  . allopurinol  100 mg Oral Daily  . amiodarone  200 mg Oral BID  . aspirin EC  81 mg Oral Daily  . calcitRIOL  1 mcg Oral Q M,W,F-HD  . Chlorhexidine Gluconate Cloth  6 each Topical Q0600  . cholecalciferol  2,000 Units Oral Daily  . clopidogrel  75 mg Oral Q breakfast  . darbepoetin (ARANESP) injection - DIALYSIS  150 mcg Intravenous Q Sat-HD  . dicyclomine  10 mg Oral QHS  . diltiazem  120 mg Oral Daily  . docusate sodium  100 mg Oral BID  . feeding supplement (NEPRO CARB STEADY)  237 mL Oral BID BM  . ferric citrate  630 mg Oral TID WC  . haloperidol lactate  4 mg Intravenous Once  . isosorbide mononitrate  30 mg Oral QHS  . LORazepam  1 mg Intravenous Once  . metoprolol tartrate  50 mg Oral BID  . mometasone-formoterol  2 puff Inhalation BID  . pantoprazole  40 mg Oral Q12H  . pravastatin  80 mg Oral QPM  . sodium chloride flush  3 mL Intravenous Q12H  . sodium chloride flush  3 mL Intravenous Q12H  . sucralfate  1 g Oral Q6H    Consultants:  Gastroenterology  Vascular surgery  Procedures:  Endoscopy 07/05/2019  Antibiotics:   Anti-infectives (From admission, onward)   None       Objective   Vitals:   07/09/19 0437 07/09/19 0602 07/09/19 0813 07/09/19 1100  BP: (!) 161/71 (!) 165/58 (!) 109/54 (!) 123/53   Pulse: (!) 58  61 63  Resp: 17  18 17   Temp: 98.4 F (36.9 C)  98.2 F (36.8 C) 98.8 F (37.1 C)  TempSrc: Oral  Oral Oral  SpO2: 96%  97% 100%  Weight:      Height:        Intake/Output Summary (Last 24 hours) at 07/09/2019 1435 Last data filed at 07/09/2019 1047 Gross per 24 hour  Intake --  Output 1 ml  Net -1 ml    06/10 1901 - 06/12 0700 In: -  Out: 100 [Urine:100]  Filed Weights   07/06/19 0742 07/06/19 1203 07/07/19 0401  Weight: 46.3 kg 45 kg 41.9 kg    Physical Examination:    General-appears in no acute distress  Heart-S1-S2, regular, no murmur auscultated  Lungs-clear  to auscultation bilaterally, no wheezing or crackles auscultated  Abdomen-soft, nontender, no organomegaly  Extremities-no edema in the lower extremities  Neuro-alert, oriented x3, no focal deficit noted    Data Reviewed:   Recent Results (from the past 240 hour(s))  SARS Coronavirus 2 by RT PCR (hospital order, performed in [facility] hospital lab) Nasopharyngeal Nasopharyngeal Swab     Status: None   Collection Time: 06/29/19  5:01 PM   Specimen: Nasopharyngeal Swab  Result Value Ref Range Status   SARS Coronavirus 2 NEGATIVE NEGATIVE Final    Comment: (NOTE) SARS-CoV-2 target nucleic acids are NOT DETECTED. The SARS-CoV-2 RNA is generally detectable in upper and lower respiratory specimens during the acute phase of infection. The lowest concentration of SARS-CoV-2 viral copies this assay can detect is 250 copies / mL. A negative result does not preclude SARS-CoV-2 infection and should not be used as the sole basis for treatment or other patient management decisions.  A negative result may occur with improper specimen collection / handling, submission of specimen other than nasopharyngeal swab, presence of viral mutation(s) within the areas targeted by this assay, and inadequate number of viral copies (<250 copies / mL). A negative result must be combined with  clinical observations, patient history, and epidemiological information. Fact Sheet for Patients:   StrictlyIdeas.no Fact Sheet for Healthcare Providers: BankingDealers.co.za This test is not yet approved or cleared  by the [place] FDA and has been authorized for detection and/or diagnosis of SARS-CoV-2 by FDA under an Emergency Use Authorization (EUA).  This EUA will remain in effect (meaning this test can be used) for the duration of the COVID-19 declaration under Section 564(b)(1) of the Act, 21 U.S.C. section 360bbb-3(b)(1), unless the authorization is terminated or revoked sooner. Performed at [facility], [place] [address] [place], [place] [address]   MRSA PCR Screening     Status: None   Collection Time: 06/30/19  5:37 PM   Specimen: Nasopharyngeal  Result Value Ref Range Status   MRSA by PCR NEGATIVE NEGATIVE Final    Comment:        The GeneXpert MRSA Assay (FDA approved for NASAL specimens only), is one component of a comprehensive MRSA colonization surveillance program. It is not intended to diagnose MRSA infection nor to guide or monitor treatment for MRSA infections. Performed at [facility], [place] [address], [place], [place] [address]     No results for input(s): LIPASE, AMYLASE in the last 168 hours. No results for input(s): AMMONIA in the last 168 hours.  Cardiac Enzymes: No results for input(s): CKTOTAL, CKMB, CKMBINDEX, TROPONINI in the last 168 hours. BNP (last 3 results) Recent Labs    01/24/19 2228 02/04/19 0103 03/25/19 1313  BNP 1,570.0* 4,197.2* 2,007.2*    [place]   [facility] If 7PM-7AM, please contact night-coverage at [email or web address], Office  [phone]   07/09/2019, 2:35 PM  LOS: 9 days

## 2019-07-09 NOTE — Progress Notes (Addendum)
[facility] [place] well without more bleeding since 10 pm last night and on bed rest.   Objective (!) 109/54 61 98.2 F (36.8 C) (Oral) 18 97% No intake or output data in the 24 hours ending 07/09/19 1638  Right groin dressing removed, groin soft without hematoma or bleeding Left doppler signals PT/peroneal Lungs non labored breathing Herat RR   Assessment/Planning: POD # 1 Procedure Performed:             1.  Ultrasound-guided access, right femoral artery             2.  Abdominal aortogram             3.  Bilateral lower extremity runoff             4.  Shockwave intra-arterial lithotripsy, left superficial femoral artery             5.  Shockwave intra-arterial lithotripsy, left common iliac artery             6.  Drug-coated balloon angioplasty, left superficial femoral artery, left common iliac artery             7.  Stent, Left common iliac artery             8.  Stent, left external iliac artery             9.  Conscious sedation 122 minutes  Patent arterial flow left LE, HGB stable 10.5.  Keep on bed rest additional 24 hours.   Continue asa and Plavix daily  The patient will be brought back for percutaneous intervention of the right leg and possible treatment of the ostial left common iliac lesion when she recovers from this procedure.  Date to be determined by MD.     Roxy Horseman 07/09/2019 8:22 AM --  Laboratory Lab Results: Recent Labs    07/08/19 0500 07/08/19 1902  WBC 5.7 7.5  HGB 10.4* 10.5*  HCT 32.8* 34.1*  PLT 275 279   BMET Recent Labs    07/07/19 0434 07/08/19 1902  NA 137 137  K 3.5 3.8  CL 101 103  CO2 25 23  GLUCOSE 91 93  BUN 11 23  CREATININE 3.04* 4.87*  CALCIUM 8.4* 8.6*    COAG Lab Results  Component Value Date   INR 1.2 06/14/2019   INR 1.1 03/25/2019   INR 0.9 01/26/2019   No results found for: PTT  I have seen and evaluated the patient. I agree with the  PA note as documented above.  Postop day 1 status post left leg intervention with shockwave lithotripsy of the SFA common iliac with stent of the iliac and drug-coated balloon of the SFA.  Called last night for hematoma in the right groin.  Ultimately pressure was held for extensive period of time and a pressure dressing was applied.  Groin looks stable this morning.  She has brisk dorsalis pedis signal in the left foot.  Will need staged intervention in the right leg in the future.  Marty Heck, MD [facility] [place] Office: [phone]

## 2019-07-09 NOTE — Progress Notes (Signed)
[facility] Progress Note   Subjective:   Patient seen in room. Arteriogram yesterday with prolonged bleeding. On bed rest per vascular. Reports she is feeling very well today, denies SOB, orthopnea, CP, palpitations, dizziness, abdominal pain, N/V/D. Thinks she had surgery this AM but unclear if this is truly confusion or just a bit disoriented given prolonged events yesterday. Otherwise alert and oriented.  Objective Vitals:   07/09/19 0437 07/09/19 0602 07/09/19 0813 07/09/19 1100  BP: (!) 161/71 (!) 165/58 (!) 109/54 (!) 123/53  Pulse: (!) 58  61 63  Resp: '17  18 17  ' Temp: 98.4 F (36.9 C)  98.2 F (36.8 C) 98.8 F (37.1 C)  TempSrc: Oral  Oral Oral  SpO2: 96%  97% 100%  Weight:      Height:       Physical Exam General: Well developed female, alert and in NAD Heart: RRR, no murmurs, rubs or gallops Lungs: CTA bilaterally without wheezing, rhonchi or rales Abdomen: Soft, non-tender, non-distended, +BS Extremities: R groin with dressing, no significant edema b/l lower extremities Dialysis Access: TDC, LUE AVF + bruit  Additional Objective Labs: Basic Metabolic Panel: Recent Labs  Lab 07/06/19 0757 07/07/19 0434 07/08/19 1902  NA 136 137 137  K 4.0 3.5 3.8  CL 102 101 103  CO2 '24 25 23  ' GLUCOSE 88 91 93  BUN 27* 11 23  CREATININE 4.25* 3.04* 4.87*  CALCIUM 8.5* 8.4* 8.6*  PHOS 3.4  --  5.0*   Liver Function Tests: Recent Labs  Lab 07/06/19 0757 07/08/19 1902  ALBUMIN 2.0* 2.1*   No results for input(s): LIPASE, AMYLASE in the last 168 hours. CBC: Recent Labs  Lab 07/05/19 0601 07/05/19 0601 07/06/19 0450 07/06/19 0450 07/07/19 0434 07/08/19 0500 07/08/19 1902  WBC 7.5   < > 6.8   < > 5.5 5.7 7.5  HGB 10.1*   < > 9.7*   < > 10.7* 10.4* 10.5*  HCT 31.0*   < > 31.0*   < > 34.7* 32.8* 34.1*  MCV 93.1  --  95.4  --  96.7 95.3 96.3  PLT 243   < > 271   < > 296 275 279   < > = values in this interval not displayed.   Blood Culture     Component Value Date/Time   SDES BLOOD RIGHT HAND 06/17/2019 1223   [place]  06/17/2019 1223    BOTTLES DRAWN AEROBIC ONLY Blood Culture results may not be optimal due to an inadequate volume of blood received in culture bottles   CULT  06/17/2019 1223    NO GROWTH 5 DAYS Performed at [facility], [place] [address], [place], [place] [address]    REPTSTATUS 06/22/2019 FINAL 06/17/2019 1223    Studies/Results: PERIPHERAL VASCULAR CATHETERIZATION  Result Date: 07/08/2019 Patient name: Christina [name] MRN: [id number] DOB: 1950-11-27 Sex: female 07/08/2019 Pre-operative Diagnosis: Severe bilateral PAD Post-operative diagnosis:  Same Surgeon:  Annamarie Major Procedure Performed:  1.  Ultrasound-guided access, right femoral artery  2.  Abdominal aortogram  3.  Bilateral lower extremity runoff  4.  Shockwave intra-arterial lithotripsy, left superficial femoral artery  5.  Shockwave intra-arterial lithotripsy, left common iliac artery  6.  Drug-coated balloon angioplasty, left superficial femoral artery, left common iliac artery  7.  Stent, Left common iliac artery  8.  Stent, left external iliac artery  9.  Conscious sedation 122 minutes Indications: The patient has severe bilateral atherosclerotic vascular disease with ulceration of the  left toes and rest pain.  She is here today for further evaluation, possible intervention Procedure:  The patient was identified in the holding area and taken to room 8.  The patient was then placed supine on the table and prepped and draped in the usual sterile fashion.  A time out was called.  Conscious sedation was administered with the use of IV fentanyl and Versed under continuous physician and nurse monitoring.  Heart rate, blood pressure, and oxygen saturation were continuously monitored.  Total sedation time was 122 minutes.  Ultrasound was used to evaluate the right common femoral artery.  It was patent .  A digital ultrasound image was acquired.  A  micropuncture needle was used to access the right common femoral artery under ultrasound guidance.  An 018 wire was advanced without resistance and a micropuncture sheath was placed.  The 018 wire was removed and a benson wire was placed.  The micropuncture sheath was exchanged for a 5 french sheath.  An omniflush catheter was advanced over the wire to the level of L-1.  An abdominal angiogram was obtained.  Next, the catheter was pulled out of the aortic bifurcation and bilateral runoff was performed. Findings:  Aortogram: Bilateral renal arteries are widely patent.  The infrarenal abdominal aorta is heavily calcified.  There are areas of hypodensity in the mid infrarenal abdominal aorta.  Pressure gradients across this area were checked and found to not be significant.  The right common iliac artery is heavily calcified and irregular however no hemodynamically significant stenosis was identified.  The left common iliac artery has approximately a 50% stenosis at its origin and a 70% stenosis in the midportion.  There was also a greater than 50% stenosis at the origin of the left external iliac artery  Right Lower Extremity: The right common femoral and profundofemoral artery heavily calcified but patent.  The superficial femoral artery is patent with a greater than 90% stenosis in the midportion as well as a approximate 50% stenosis in the adductor canal.  The anterior tibial is the dominant runoff across the ankle however it does have a abrupt cut off as it crosses the ankle.  The peroneal artery is patent throughout its course.  The posterior tibial artery is occluded  Left Lower Extremity: The left common femoral and profundofemoral artery are calcified but patent without stenosis.  The superficial femoral artery is small in caliber.  There is a greater than 90% stenosis in the adductor canal.  The popliteal artery is widely patent.  There is two-vessel runoff via the posterior tibial and peroneal artery.  The  anterior tibial artery is occluded. Intervention: After the above images were acquired the decision made to proceed with intervention.  A 6 French 45 cm sheath was advanced into the left external iliac artery.  The patient was fully heparinized.  Using a 014 wire and a quick cross catheter the lesion in the left superficial femoral artery was crossed.  I then performed shockwave intra-arterial lithotripsy of this lesion for a total of 150 seconds using a 4 x 40 balloon.  I then performed drug-coated balloon angioplasty of this lesion using a 4 x 40 impact balloon.  The balloon was held for 2 atm.  Completion imaging showed a nonflow limiting dissection and also a small intimal flap.  I then reinserted the 4 x 40 balloon and repeated balloon angioplasty at a low atmospheres for 3 minutes.  Follow-up imaging showed near resolution of the intimal flap and improved  appearance of the nonflow limiting dissection. Next attention was turned towards the common iliac artery.  I performed shockwave intra-arterial lithotripsy of this heavily calcified lesion using the same 4 x 40 balloon.  A total of 120 seconds were performed.  I then used a Ranger 6 x 40 drug-coated balloon for 3 minutes at nominal pressure.  Completion imaging showed persistent stenosis in this area and so I elected to primarily stent this.  This was done with a Herculink 8 x 22.  Follow-up imaging showed resolution of the common iliac stenosis however now the origin of the external iliac artery appeared greater than 70% and I felt that it needed to be addressed and so this was stented using a Abbott self-expanding 8 x 40 stent which was postdilated with a 7 mm balloon.  Completion imaging showed resolution of the stenosis within the left iliac system.  Catheters and wires were removed.  The long 6 French sheath was exchanged out for a short 7 [place] sheath.  The patient be taken the holding area for sheath pull once her coagulation profile correct.  Impression:  #1 greater than 70% left common iliac artery stenosis treated with shockwave intra-arterial lithotripsy, drug-coated balloon, and ultimate stenting using an 8 x 22 balloon expandable stent.  #2 proximal left external iliac artery stenosis successfully treated using an 8 x 40 self-expanding stent  #3 near total occlusion of the superficial femoral artery on the left at the adductor canal treated using shockwave intra-arterial lithotripsy and drug-coated balloon angioplasty with a 4 mm balloon.  #4 two-vessel runoff via the posterior tibial and peroneal artery on the left  #5 there are tandem lesions that are hemodynamically significant in the right superficial femoral artery with two-vessel runoff.  6.  The patient will be brought back for percutaneous intervention of the right leg when she recovers from this procedure. Theotis Burrow, M.D., [facility] [place] Office: [phone] Pager:  [phone]  Medications: . sodium chloride    . sodium chloride    . sodium chloride    . ferric gluconate (FERRLECIT/NULECIT) IV Stopped (07/06/19 1101)  . ferric gluconate (FERRLECIT/NULECIT) IV     . allopurinol  100 mg Oral Daily  . amiodarone  200 mg Oral BID  . aspirin EC  81 mg Oral Daily  . calcitRIOL  1 mcg Oral Q M,W,F-HD  . Chlorhexidine Gluconate Cloth  6 each Topical Q0600  . cholecalciferol  2,000 Units Oral Daily  . clopidogrel  75 mg Oral Q breakfast  . darbepoetin (ARANESP) injection - DIALYSIS  150 mcg Intravenous Q Sat-HD  . dicyclomine  10 mg Oral QHS  . diltiazem  120 mg Oral Daily  . docusate sodium  100 mg Oral BID  . feeding supplement (NEPRO CARB STEADY)  237 mL Oral BID BM  . ferric citrate  630 mg Oral TID WC  . haloperidol lactate  4 mg Intravenous Once  . isosorbide mononitrate  30 mg Oral QHS  . LORazepam  1 mg Intravenous Once  . metoprolol tartrate  50 mg Oral BID  . mometasone-formoterol  2 puff Inhalation BID  . pantoprazole   40 mg Oral Q12H  . pravastatin  80 mg Oral QPM  . sodium chloride flush  3 mL Intravenous Q12H  . sodium chloride flush  3 mL Intravenous Q12H  . sucralfate  1 g Oral Q6H    Dialysis Orders: [facility] T[facility] 4 hr EDW 48 kgis ?? F180 BFR  350 mL/ min DFR A1.5 UF profile 2no heparin Venofer 100 mg q rx x 9, started 5/27 Mircera 200 q 2 weeks, last given 06/02/19 Calcitriol 1.0 cmg q rx   Assessment/Plan: 1.Recurrent GIB - melanoic stools on HD with abdominal pain -s/p 4 units PRBC GI following- presumed bleeding from previously diagnosed severe duodenal ulcerations,recent acute GIB in May with multiple deep duodenal ulcers -On PPI bid. Repeat EGD 6/8 showed multiple plagues in the mid and distal esophagus, smal HH, two non-bleeding duodenal ulcers. No heparin. Hgb is stable.  2. ESRD- Normally TTS, ran M/W today but getting back on schedule today. AVF using two needles without problem- 17 gauge - plan to use 16g today.   3. Anemia-Acute on chronic 2/2  #1.hgbstabilized.Aranesp 200 given 6/5 - s/p course of Fe. Titrate dose of Aranesp to 150 for today. 4. Secondary hyperparathyroidism- resumed calcitriol, Ca and phos in goal. 5.HTN/volumeVolume overloaded on admit but now much improved. Weights variable here, will follow post HD. Needs a new EDW at discharge. Continue home dilt 120 daily , cont metoprolol.- Will have lower EDW for d/c  6. Nutrition-On renal diet, protein supplements and vitamin.  7. Mesenteric ischemia s/p SMA stent 05/2019 8.PAD -ischemic left 4th and 5th toes, s/p arteriogram 6/11 with prolonged bleeding, now resolved but on bed rest. 9. Atrial fibrillation -- on amiodarone,in NSR 10. Hx cocaine abuse - UDS negative  11. 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 DNR 13. Disp -tentative SNF  Anice Paganini, PA-C 07/09/2019, 11:04 AM  [facility] Pager: [phone]

## 2019-07-10 LAB — CBC
HCT: 31.8 % — ABNORMAL LOW (ref 36.0–46.0)
Hemoglobin: 9.8 g/dL — ABNORMAL LOW (ref 12.0–15.0)
MCH: 29.1 pg (ref 26.0–34.0)
MCHC: 30.8 g/dL (ref 30.0–36.0)
MCV: 94.4 fL (ref 80.0–100.0)
Platelets: 267 10*3/uL (ref 150–400)
RBC: 3.37 MIL/uL — ABNORMAL LOW (ref 3.87–5.11)
RDW: 16.5 % — ABNORMAL HIGH (ref 11.5–15.5)
WBC: 9.1 10*3/uL (ref 4.0–10.5)
nRBC: 0 % (ref 0.0–0.2)

## 2019-07-10 MED ORDER — DARBEPOETIN ALFA 150 MCG/0.3ML IJ SOSY
150.0000 ug | PREFILLED_SYRINGE | Freq: Once | INTRAMUSCULAR | Status: AC
  Administered 2019-07-10: 150 ug via INTRAVENOUS
  Filled 2019-07-10: qty 0.3

## 2019-07-10 NOTE — Progress Notes (Signed)
[facility]  PROGRESS NOTE  Christina [name] XBD:[id number] DOB: 1950/07/25 DOA: 06/29/2019 PCP: Maggie Schwalbe, PA-C   Brief HPI:   69 year old female with medical history significant of hypertension, hyperlipidemia, CAD, atrial fibrillation, COPD, ESRD on HD TTS, SBO, rectal prolapse, polysubstance abuse including cocaine who initially presented with complaints of rectal pain aggressively worsening over the past 4 days.  Patient missed her hemodialysis due to pain.  She is followed by Dr. Oneida Alar vascular surgery who has been contemplating amputating her toes.  Patient has had several bloody bowel movements while she was in the hospital.  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  She was hospitalized from 05/10/2019 to 05/12/2019 with acute respiratory failure secondary to volume overload and COPD exacerbation.  4/25-4/27 at [facility] for atrial fibrillation with RVR, no longer on anticoagulation due to large duodenal ulcer.  5/8-5/9, hospitalized for peripheral arterial disease and gangrene of left foot for which she was discharged to follow with Dr. Oneida Alar.  Lastly hospitalized from 5/18-5/24 with GI bleed found to have several deep duodenal ulcers without underlying active sign of bleeding. Patient was found to be hypotensive on admission with BP 76/52, which responded to PRBC.  But she was continued to have melena during hospital stay.  She was seen by GI, hemoglobin stabilized after units PRBC.  Plan for endoscopy per GI and arteriogram per vascular surgery.   Subjective   Patient seen and examined, denies any complaints.  Vascular surgery has cleared patient for discharge.  PT recommending skilled nursing facility for rehab.  Patient wants to go to rehab.   Assessment/Plan:     1. GI  bleed/acute blood loss anemia-endoscopy showed duodenal ulcer with esophageal plaques.  Biopsies are pending.  Patient was transfused 3 units PRBC.  Hemoglobin is stable at 9.7.  Aspirin and Plavix are currently on hold.  It can be resumed in 3 days per GI if indicated.  Aspirin and Plavix has been restarted. 2. Peripheral vascular disease with ischemic changes without gangrene-s/p stent placement in left common iliac artery, left external iliac artery vascular surgery following.  Continue aspirin and Plavix 3. Delirium-resolved, likely at baseline.  Patient had multiple episodes of delirium for which she required IM Haldol.  We will continue to monitor. 4. ESRD on hemodialysis-nephrology following. 5. Rectal prolapse-acute on chronic, lidocaine jelly as needed. 6. Paroxysmal atrial fibrillation-she is not on anticoagulation due to history of GI bleed, continue amiodarone, metoprolol. 7. COPD without acute exacerbation-continue Dulera, as needed DuoNebs. 8. Hyperlipidemia-continue pravastatin. 9. Hypertension-blood pressure was elevated, dose of metoprolol was changed to 50 mg twice a day.     SpO2: 98 % O2 Flow Rate (L/min): 2 L/min FiO2 (%): 21 %   COVID-19 Labs  No results for input(s): DDIMER, FERRITIN, LDH, CRP in the last 72 hours.  Lab Results  Component Value Date   SARSCOV2NAA NEGATIVE 06/29/2019   [place] NEGATIVE 06/14/2019   [place] NEGATIVE 05/10/2019   [place] NEGATIVE 04/23/2019     CBG: No results for input(s): GLUCAP in the last 168 hours.  CBC: Recent Labs  Lab 07/06/19 0450 07/07/19 0434 07/08/19 0500 07/08/19 1902 07/10/19 0411  WBC 6.8 5.5 5.7 7.5 9.1  HGB 9.7* 10.7* 10.4* 10.5* 9.8*  HCT 31.0* 34.7* 32.8* 34.1* 31.8*  MCV 95.4 96.7 95.3 96.3 94.4  PLT 271  296 275 279 829    Basic Metabolic Panel: Recent Labs  Lab 07/04/19 1023 07/06/19 0757 07/07/19 0434 07/08/19 1902  NA 136 136 137 137  K 4.0 4.0 3.5 3.8  CL 102 102 101 103   CO2 24 24 25 23   GLUCOSE 125* 88 91 93  BUN 44* 27* 11 23  CREATININE 5.05* 4.25* 3.04* 4.87*  CALCIUM 8.6* 8.5* 8.4* 8.6*  PHOS  --  3.4  --  5.0*     Liver Function Tests: Recent Labs  Lab 07/06/19 0757 07/08/19 1902  ALBUMIN 2.0* 2.1*    DVT prophylaxis: SCDs  Code Status: Full code  Family Communication: No family at bedside    Status is: Inpatient  Dispo: The patient is from: Home              Anticipated d/c is to: Skilled nursing facility              Anticipated d/c date is: 07/11/2019              Patient is medically stable for discharge  Barrier to discharge-awaiting bed at skilled nursing facility     Scheduled medications:  . allopurinol  100 mg Oral Daily  . amiodarone  200 mg Oral BID  . aspirin EC  81 mg Oral Daily  . calcitRIOL  1 mcg Oral Q M,W,F-HD  . Chlorhexidine Gluconate Cloth  6 each Topical Q0600  . cholecalciferol  2,000 Units Oral Daily  . clopidogrel  75 mg Oral Q breakfast  . darbepoetin (ARANESP) injection - DIALYSIS  150 mcg Intravenous Q Sat-HD  . dicyclomine  10 mg Oral QHS  . diltiazem  120 mg Oral Daily  . docusate sodium  100 mg Oral BID  . feeding supplement (NEPRO CARB STEADY)  237 mL Oral BID BM  . ferric citrate  630 mg Oral TID WC  . haloperidol lactate  4 mg Intravenous Once  . isosorbide mononitrate  30 mg Oral QHS  . LORazepam  1 mg Intravenous Once  . metoprolol tartrate  50 mg Oral BID  . mometasone-formoterol  2 puff Inhalation BID  . pantoprazole  40 mg Oral Q12H  . pravastatin  80 mg Oral QPM  . sodium chloride flush  3 mL Intravenous Q12H  . sodium chloride flush  3 mL Intravenous Q12H  . sucralfate  1 g Oral Q6H    Consultants:  Gastroenterology  Vascular surgery  Procedures:  Endoscopy 07/05/2019  Antibiotics:   Anti-infectives (From admission, onward)   None       Objective   Vitals:   07/10/19 0015 07/10/19 0114 07/10/19 0419 07/10/19 0809  BP: (!) 131/109 136/70 116/78   Pulse:  74 98 69   Resp: 16 16 12    Temp: 98.7 F (37.1 C) 97.9 F (36.6 C) 98.7 F (37.1 C)   TempSrc: Oral Oral Oral   SpO2: 98% 98% 100% 98%  Weight:   43.3 kg   Height:        Intake/Output Summary (Last 24 hours) at 07/10/2019 1027 Last data filed at 07/10/2019 0342 Gross per 24 hour  Intake 100 ml  Output 3001 ml  Net -2901 ml    06/11 1901 - 06/13 0700 In: 100  Out: 3001 [Urine:1]  Filed Weights   07/06/19 1203 07/07/19 0401 07/10/19 0419  Weight: 45 kg 41.9 kg 43.3 kg    Physical Examination:   General-appears in no acute distress Heart-S1-S2, regular, no murmur auscultated  Lungs-clear to auscultation bilaterally, no wheezing or crackles auscultated Abdomen-soft, nontender, no organomegaly Extremities-no edema in the lower extremities Neuro-alert, oriented x3, no focal deficit noted   Data Reviewed:   Recent Results (from the past 240 hour(s))  MRSA PCR Screening     Status: None   Collection Time: 06/30/19  5:37 PM   Specimen: Nasopharyngeal  Result Value Ref Range Status   MRSA by PCR NEGATIVE NEGATIVE Final    Comment:        The GeneXpert MRSA Assay (FDA approved for NASAL specimens only), is one component of a comprehensive MRSA colonization surveillance program. It is not intended to diagnose MRSA infection nor to guide or monitor treatment for MRSA infections. Performed at [facility], [place] [address], [place], [place] [address]     No results for input(s): LIPASE, AMYLASE in the last 168 hours. No results for input(s): AMMONIA in the last 168 hours.  Cardiac Enzymes: No results for input(s): CKTOTAL, CKMB, CKMBINDEX, TROPONINI in the last 168 hours. BNP (last 3 results) Recent Labs    01/24/19 2228 02/04/19 0103 03/25/19 1313  BNP 1,570.0* 4,197.2* 2,007.2*    [place]   [facility] If 7PM-7AM, please contact night-coverage at [email or web address], Office  [phone]   07/10/2019, 10:27 AM  LOS: 10 days

## 2019-07-10 NOTE — Discharge Instructions (Signed)
° °  [facility] ° °Discharge Instructions ° °Lower Extremity Angiogram; Angioplasty/Stenting ° °Please refer to the following instructions for your post-procedure care. Your surgeon or physician assistant will discuss any changes with you. ° °Activity ° °Avoid lifting more than 8 pounds (1 gallons of milk) for 72 hours (3 days) after your procedure. You may walk as much as you can tolerate. It's OK to drive after 72 hours. ° °Bathing/Showering ° °You may shower the day after your procedure. If you have a bandage, you may remove it at 24- 48 hours. Clean your incision site with mild soap and water. Pat the area dry with a clean towel. ° °Diet ° °Resume your pre-procedure diet. There are no special food restrictions following this procedure. All patients with peripheral vascular disease should follow a low fat/low cholesterol diet. In order to heal from your surgery, it is CRITICAL to get adequate nutrition. Your body requires vitamins, minerals, and protein. Vegetables are the best source of vitamins and minerals. Vegetables also provide the perfect balance of protein. Processed food has little nutritional value, so try to avoid this. ° °Medications ° °Resume taking all of your medications unless your doctor tells you not to. If your incision is causing pain, you may take over-the-counter pain relievers such as acetaminophen (Tylenol) ° °Follow Up ° °Follow up will be arranged at the time of your procedure. You may have an office visit scheduled or may be scheduled for surgery. Ask your surgeon if you have any questions. ° °Please call us immediately for any of the following conditions: °•Severe or worsening pain your legs or feet at rest or with walking. °•Increased pain, redness, drainage at your groin puncture site. °•Fever of 101 degrees or higher. °•If you have any mild or slow bleeding from your puncture site: lie down, apply firm constant pressure over the area with a piece of  gauze or a clean wash cloth for 30 minutes- no peeking!, call 911 right away if you are still bleeding after 30 minutes, or if the bleeding is heavy and unmanageable. ° °Reduce your risk factors of vascular disease: ° °Stop smoking. If you would like help call [facility] at 1-800-QUIT-NOW (1-800-784-8669) or [facility] at 336-586-4000. °Manage your cholesterol °Maintain a desired weight °Control your diabetes °Keep your blood pressure down ° °If you have any questions, please call the office at [phone] ° °

## 2019-07-10 NOTE — Progress Notes (Signed)
   07/10/19 0015  Hand-Off documentation  Handoff Given Given to shift RN/LPN  Report given to (Full Name) Ardath Sax, RN  Handoff Received Received from shift RN/LPN  Report received from (Full Name) Lakeesha Fontanilla  Vital Signs  Temp 98.7 F (37.1 C)  Temp Source Oral  Pulse Rate 74  Pulse Rate Source Monitor  Resp 16  BP (!) 131/109  BP Location Right Arm  BP Method Automatic  Patient Position (if appropriate) Lying  Oxygen Therapy  SpO2 98 %  O2 Device Room Air  Post-Hemodialysis Assessment  Rinseback Volume (mL) 250 mL  KECN 217 V  Dialyzer Clearance Lightly streaked  Duration of HD Treatment -hour(s) 4 hour(s)  Hemodialysis Intake (mL) 500 mL  UF Total -Machine (mL) 3500 mL  Net UF (mL) 3000 mL  Tolerated HD Treatment Yes  Post-Hemodialysis Comments tx achieved as expected, pt stable  AVG/AVF Arterial Site Held (minutes) 15 minutes  AVG/AVF Venous Site Held (minutes) 15 minutes  Education / Care Plan  Dialysis Education Provided Yes  Fistula / Graft Left Upper arm Arteriovenous fistula  Placement Date/Time: (c) 08/04/18 1124   Placed prior to admission: No  Orientation: Left  Access Location: Upper arm  Access Type: Arteriovenous fistula  Site Condition No complications  Fistula / Graft Assessment Thrill;Bruit;Present  Status Deaccessed

## 2019-07-10 NOTE — Progress Notes (Signed)
[name] Progress Note   Subjective:   Patient seen in room. Upset because she was not sure what her discharge plan is. Discussed with RN and appears plan is for SNF which patient is agreeable to. Denies SOB, orthopnea, CP, palpitations, dizziness, abdominal pain, nausea. Reports she did have some blood in her stool this AM.  Objective Vitals:   07/10/19 0015 07/10/19 0114 07/10/19 0419 07/10/19 0809  BP: (!) 131/109 136/70 116/78   Pulse: 74 98 69   Resp: _0 Temp: 98.7 F (37.1 C) 97.9 F (36.6 C) 98.7 F (37.1 C)   TempSrc: Oral Oral Oral   SpO2: 98% 98% 100% 98%  Weight:   43.3 kg   Height:       Physical Exam General: Well developed female, alert and in NAD Heart: RRR, no murmurs, rubs or gallops Lungs: CTA bilaterally without wheezing, rhonchi or rales Abdomen: Soft, non-tender, non-distended, +BS Extremities: No edema b/l lower extremities Dialysis Access: TDC, LUE AVF + bruit  Additional Objective Labs: Basic Metabolic Panel: Recent Labs  Lab 07/06/19 0757 07/07/19 0434 07/08/19 1902  NA 136 137 137  K 4.0 3.5 3.8  CL 102 101 103  CO2 _1 GLUCOSE 88 91 93  BUN 27* 11 23  CREATININE 4.25* 3.04* 4.87*  CALCIUM 8.5* 8.4* 8.6*  PHOS 3.4  --  5.0*   Liver Function Tests: Recent Labs  Lab 07/06/19 0757 07/08/19 1902  ALBUMIN 2.0* 2.1*   CBC: Recent Labs  Lab 07/06/19 0450 07/06/19 0450 07/07/19 0434 07/07/19 0434 07/08/19 0500 07/08/19 1902 07/10/19 0411  WBC 6.8   < > 5.5   < > 5.7 7.5 9.1  HGB 9.7*   < > 10.7*   < > 10.4* 10.5* 9.8*  HCT 31.0*   < > 34.7*   < > 32.8* 34.1* 31.8*  MCV 95.4  --  96.7  --  95.3 96.3 94.4  PLT 271   < > 296   < > 275 279 267   < > = values in this interval not displayed.   Blood Culture    Component Value Date/Time   SDES BLOOD RIGHT HAND 06/17/2019 1223   [place]  06/17/2019 1223    BOTTLES DRAWN AEROBIC ONLY Blood Culture results may not be optimal due to an inadequate  volume of blood received in culture bottles   CULT  06/17/2019 1223    NO GROWTH 5 DAYS Performed at [facility], [place] [address], [place], [place] [address]    REPTSTATUS 06/22/2019 FINAL 06/17/2019 1223     Studies/Results: PERIPHERAL VASCULAR CATHETERIZATION  Result Date: 07/08/2019 Patient name: [name] MRN: [id number] DOB: 1950/03/22 Sex: female 07/08/2019 Pre-operative Diagnosis: Severe bilateral PAD Post-operative diagnosis:  Same Surgeon:  Annamarie Major Procedure Performed:  1.  Ultrasound-guided access, right femoral artery  2.  Abdominal aortogram  3.  Bilateral lower extremity runoff  4.  Shockwave intra-arterial lithotripsy, left superficial femoral artery  5.  Shockwave intra-arterial lithotripsy, left common iliac artery  6.  Drug-coated balloon angioplasty, left superficial femoral artery, left common iliac artery  7.  Stent, Left common iliac artery  8.  Stent, left external iliac artery  9.  Conscious sedation 122 minutes Indications: The patient has severe bilateral atherosclerotic vascular disease with ulceration of the left toes and rest pain.  She is here today for further evaluation, possible intervention Procedure:  The patient was identified in the holding area  and taken to room 8.  The patient was then placed supine on the table and prepped and draped in the usual sterile fashion.  A time out was called.  Conscious sedation was administered with the use of IV fentanyl and Versed under continuous physician and nurse monitoring.  Heart rate, blood pressure, and oxygen saturation were continuously monitored.  Total sedation time was 122 minutes.  Ultrasound was used to evaluate the right common femoral artery.  It was patent .  A digital ultrasound image was acquired.  A micropuncture needle was used to access the right common femoral artery under ultrasound guidance.  An 018 wire was advanced without resistance and a micropuncture sheath was placed.  The 018 wire was  removed and a benson wire was placed.  The micropuncture sheath was exchanged for a 5 french sheath.  An omniflush catheter was advanced over the wire to the level of L-1.  An abdominal angiogram was obtained.  Next, the catheter was pulled out of the aortic bifurcation and bilateral runoff was performed. Findings:  Aortogram: Bilateral renal arteries are widely patent.  The infrarenal abdominal aorta is heavily calcified.  There are areas of hypodensity in the mid infrarenal abdominal aorta.  Pressure gradients across this area were checked and found to not be significant.  The right common iliac artery is heavily calcified and irregular however no hemodynamically significant stenosis was identified.  The left common iliac artery has approximately a 50% stenosis at its origin and a 70% stenosis in the midportion.  There was also a greater than 50% stenosis at the origin of the left external iliac artery  Right Lower Extremity: The right common femoral and profundofemoral artery heavily calcified but patent.  The superficial femoral artery is patent with a greater than 90% stenosis in the midportion as well as a approximate 50% stenosis in the adductor canal.  The anterior tibial is the dominant runoff across the ankle however it does have a abrupt cut off as it crosses the ankle.  The peroneal artery is patent throughout its course.  The posterior tibial artery is occluded  Left Lower Extremity: The left common femoral and profundofemoral artery are calcified but patent without stenosis.  The superficial femoral artery is small in caliber.  There is a greater than 90% stenosis in the adductor canal.  The popliteal artery is widely patent.  There is two-vessel runoff via the posterior tibial and peroneal artery.  The anterior tibial artery is occluded. Intervention: After the above images were acquired the decision made to proceed with intervention.  A 6 French 45 cm sheath was advanced into the left external iliac  artery.  The patient was fully heparinized.  Using a 014 wire and a quick cross catheter the lesion in the left superficial femoral artery was crossed.  I then performed shockwave intra-arterial lithotripsy of this lesion for a total of 150 seconds using a 4 x 40 balloon.  I then performed drug-coated balloon angioplasty of this lesion using a 4 x 40 impact balloon.  The balloon was held for 2 atm.  Completion imaging showed a nonflow limiting dissection and also a small intimal flap.  I then reinserted the 4 x 40 balloon and repeated balloon angioplasty at a low atmospheres for 3 minutes.  Follow-up imaging showed near resolution of the intimal flap and improved appearance of the nonflow limiting dissection. Next attention was turned towards the common iliac artery.  I performed shockwave intra-arterial lithotripsy of this heavily calcified  lesion using the same 4 x 40 balloon.  A total of 120 seconds were performed.  I then used a Ranger 6 x 40 drug-coated balloon for 3 minutes at nominal pressure.  Completion imaging showed persistent stenosis in this area and so I elected to primarily stent this.  This was done with a Herculink 8 x 22.  Follow-up imaging showed resolution of the common iliac stenosis however now the origin of the external iliac artery appeared greater than 70% and I felt that it needed to be addressed and so this was stented using a Abbott self-expanding 8 x 40 stent which was postdilated with a 7 mm balloon.  Completion imaging showed resolution of the stenosis within the left iliac system.  Catheters and wires were removed.  The long 6 French sheath was exchanged out for a short 7 [place] sheath.  The patient be taken the holding area for sheath pull once her coagulation profile correct. Impression:  #1 greater than 70% left common iliac artery stenosis treated with shockwave intra-arterial lithotripsy, drug-coated balloon, and ultimate stenting using an 8 x 22 balloon expandable stent.  #2  proximal left external iliac artery stenosis successfully treated using an 8 x 40 self-expanding stent  #3 near total occlusion of the superficial femoral artery on the left at the adductor canal treated using shockwave intra-arterial lithotripsy and drug-coated balloon angioplasty with a 4 mm balloon.  #4 two-vessel runoff via the posterior tibial and peroneal artery on the left  #5 there are tandem lesions that are hemodynamically significant in the right superficial femoral artery with two-vessel runoff.  6.  The patient will be brought back for percutaneous intervention of the right leg when she recovers from this procedure. Theotis Burrow, M.D., [facility] [place] Office: [phone] Pager:  [phone]  Medications: . sodium chloride    . ferric gluconate (FERRLECIT/NULECIT) IV Stopped (07/06/19 1101)   . allopurinol  100 mg Oral Daily  . amiodarone  200 mg Oral BID  . aspirin EC  81 mg Oral Daily  . calcitRIOL  1 mcg Oral Q M,W,F-HD  . Chlorhexidine Gluconate Cloth  6 each Topical Q0600  . cholecalciferol  2,000 Units Oral Daily  . clopidogrel  75 mg Oral Q breakfast  . darbepoetin (ARANESP) injection - DIALYSIS  150 mcg Intravenous Q Sat-HD  . dicyclomine  10 mg Oral QHS  . diltiazem  120 mg Oral Daily  . docusate sodium  100 mg Oral BID  . feeding supplement (NEPRO CARB STEADY)  237 mL Oral BID BM  . ferric citrate  630 mg Oral TID WC  . haloperidol lactate  4 mg Intravenous Once  . isosorbide mononitrate  30 mg Oral QHS  . LORazepam  1 mg Intravenous Once  . metoprolol tartrate  50 mg Oral BID  . mometasone-formoterol  2 puff Inhalation BID  . pantoprazole  40 mg Oral Q12H  . pravastatin  80 mg Oral QPM  . sodium chloride flush  3 mL Intravenous Q12H  . sodium chloride flush  3 mL Intravenous Q12H  . sucralfate  1 g Oral Q6H    Dialysis Orders: [facility] 4 hr EDW 48 kg F180 BFR 350 mL/ min DFR A1.5 UF profile 2no heparin Venofer  100 mg q rx x 9, started 5/27 Mircera 200 q 2 weeks, last given 06/02/19 Calcitriol 1.0 cmg q rx  Assessment/Plan: 1.Recurrent GIB - melanoic stools on HD with abdominal pain -s/p 4 units  [facility] GI following- presumed bleeding from previously diagnosed severe duodenal ulcerations,recent acute GIB in May with multiple deep duodenal ulcers -On PPI bid.Repeat EGD 6/8 showed multiple plagues in the mid and distal esophagus, smal HH, two non-bleeding duodenal ulcers. No heparin. Hgb a bit lower today but may be due to post op bleeding.  2. ESRD- TTS schedule, next HD 6/15.K+ 3.8. AVF working well, has tolerated 16g needles on Saturday, will attempt 15g on Tuesday.  3. Anemia-Acute on chronic 2/2 #1.hgbstabilized with slight post op drop.Aranesp 16m q Saturday,s/p course of Fe. 4. Secondary hyperparathyroidism- resumed calcitriol, Ca and phos in goal. 5.HTN/volumeVolume overloaded on admit but now much improved. Weights variable here, will follow post HD. Needs a new EDW at discharge. Continue home dilt 120 daily , cont metoprolol. 6. Nutrition-On renal diet, protein supplements and vitamin.  7. Mesenteric ischemia s/p SMA stent 05/2019 8.PAD -ischemic left 4th and 5th toes, s/p arteriogram 6/11 with prolonged bleeding, now resolved. Per vascular 9. Atrial fibrillation -- on amiodarone,in NSR 10. Hx cocaine abuse - UDS negative  11. 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 DNR 13. Disp -tentative SNF  SAnice Paganini PA-C 07/10/2019, 10:44 AM  [facility] Pager: [phone]

## 2019-07-10 NOTE — Progress Notes (Signed)
Physical Therapy Treatment Patient Details Name: [name] MRN: [id number] DOB: 08-19-1950 Today's Date: 07/10/2019    History of Present Illness Pt is a 69 y/o female admitted secondary to rectoal prolapse and anemia. PMH includes HTN, depression, CAD s/p stent, COPD, ESRD on HD TTS, and a fib. Pt also with significant Bil foot pain to be assessed via arterogram by vascular surgery this week.      PT Comments    Patient seen for activity progression. Overall limited by pain and self tolerance for activity. Patient reports decreased care giver assist and demonstrates some safety awareness and receptivity deficits.  Feel current POC remains appropriate.  Follow Up Recommendations  SNF;Supervision/Assistance - 24 hour     Equipment Recommendations  Rolling walker with 5" wheels;Wheelchair (measurements PT);Wheelchair cushion (measurements PT)    Recommendations for Other Services       Precautions / Restrictions Precautions Precautions: Fall Precaution Comments: primarily due to bil foot pain, pt is unable to bear weight on feet to walk, only long enough to pivot to [facility] Restrictions Weight Bearing Restrictions: Yes    Mobility  Bed Mobility Overal bed mobility: Modified Independent Bed Mobility: Supine to Sit;Sit to Supine Rolling: Supervision   Supine to sit: Supervision Sit to supine: Supervision   General bed mobility comments: up to sit EOB no assist  Transfers Overall transfer level: Needs assistance Equipment used: Rolling walker (2 wheeled) Transfers: Sit to/from Stand Sit to Stand: Min guard         General transfer comment: Assist for power up to stand at RW with A for balance/safety  Ambulation/Gait Ambulation/Gait assistance: Min guard Gait Distance (Feet): 42 Feet Assistive device: Rolling walker (2 wheeled) Gait Pattern/deviations: Step-through pattern;Decreased stride length;Antalgic Gait velocity: slowed Gait velocity interpretation: <1.31  ft/sec, indicative of household ambulator General Gait Details: antalgic gait, limited by pain with mobility   Stairs             Wheelchair Mobility    Modified Rankin (Stroke Patients Only)       Balance Overall balance assessment: Needs assistance Sitting-balance support: Feet supported;Bilateral upper extremity supported;No upper extremity supported Sitting balance-Leahy Scale: Good     Standing balance support: Bilateral upper extremity supported;No upper extremity supported Standing balance-Leahy Scale: Poor Standing balance comment: can stand without UE support briefly when transitioning to hands on chair                            Cognition Arousal/Alertness: Awake/alert Behavior During Therapy: WFL for tasks assessed/performed Overall Cognitive Status: Within Functional Limits for tasks assessed Area of Impairment: Safety/judgement                 Orientation Level: Disoriented to;Time;Situation Current Attention Level: Selective Memory: Decreased short-term memory Following Commands: Follows one step commands inconsistently;Follows multi-step commands inconsistently Safety/Judgement: Decreased awareness of safety;Decreased awareness of deficits Awareness: Emergent Problem Solving: Slow processing;Difficulty sequencing;Requires verbal cues;Requires tactile cues General Comments: decreased overall awareness       Exercises Other Exercises Other Exercises: encouraged seated ROM ankle pumps and LAQs    General Comments        Pertinent Vitals/Pain Pain Assessment: Faces Faces Pain Scale: Hurts even more Pain Location: R foot with ambulation Pain Descriptors / Indicators: Grimacing Pain Intervention(s): Monitored during session    Home Living  Prior Function            PT Goals (current goals can now be found in the care plan section) Acute Rehab PT Goals Patient Stated Goal: to go to rehab PT  Goal Formulation: With patient Time For Goal Achievement: 07/13/19 Potential to Achieve Goals: Fair Progress towards PT goals: Progressing toward goals    Frequency    Min 2X/week      PT Plan Current plan remains appropriate    Co-evaluation              AM-PAC PT "6 Clicks" Mobility   Outcome Measure  Help needed turning from your back to your side while in a flat bed without using bedrails?: None Help needed moving from lying on your back to sitting on the side of a flat bed without using bedrails?: None Help needed moving to and from a bed to a chair (including a wheelchair)?: A Little Help needed standing up from a chair using your arms (e.g., wheelchair or bedside chair)?: A Little Help needed to walk in hospital room?: A Little Help needed climbing 3-5 steps with a railing? : A Lot 6 Click Score: 19    End of Session Equipment Utilized During Treatment: Gait belt Activity Tolerance: Patient tolerated treatment well Patient left: in bed;with call bell/phone within reach Nurse Communication: Mobility status PT Visit Diagnosis: Muscle weakness (generalized) (M62.81);Difficulty in walking, not elsewhere classified (R26.2);Pain Pain - Right/Left: Right Pain - part of body: Ankle and joints of foot     Time: 3437-3578 PT Time Calculation (min) (ACUTE ONLY): 18 min  Charges:  $Gait Training: 8-22 mins                     Alben Deeds, PT DPT  Board Certified Neurologic Specialist [place] Office [place] 07/10/2019, 9:50 AM

## 2019-07-10 NOTE — TOC Progression Note (Signed)
Transition of Care [facility]) - Progression Note    Patient Details  Name: [name] MRN: [id number] Date of Birth: 1951/01/19  Transition of Care [facility]) CM/SW [place], [place] Phone Number: [phone] 07/10/2019, 1:12 PM  Clinical Narrative:     CSW spoke with patient's [name] in regard to bed offers. [name] chose [facility]. CSW reached out to [facility] however CSW had to leave a message.  CSW inititated [facility] authorization for a start date of 07/11/19.Facility name will still need given to [facility] due to not receiving a confirmation from facility.  [place] reference [phone] Clinicals faxed   TOC team will continue to assist with discharge planning needs.   Expected Discharge Plan: Skilled Nursing Facility Barriers to Discharge: Continued Medical Work up, Ship broker  Expected Discharge Plan and Services Expected Discharge Plan: [facility] In-house Referral: Clinical Social Work   Post Acute Care Choice: [place] Living arrangements for the past 2 months: Apartment                                       Social Determinants of Health (SDOH) Interventions    Readmission Risk Interventions Readmission Risk Prevention Plan 07/05/2019 06/17/2019 03/29/2019  Transportation Screening Complete Complete Complete  Medication Review Press photographer) Complete Complete Complete  PCP or Specialist appointment within 3-5 days of discharge Complete - -  [place] or Home Care Consult Complete Complete Complete  SW Recovery Care/Counseling Consult Complete Complete Complete  Palliative Care Screening Not Applicable Not Applicable Not Applicable  Skilled Nursing Facility Complete (No Data) Not Applicable  Some recent data might be hidden

## 2019-07-10 NOTE — Progress Notes (Addendum)
[facility] [place]  Subjective  -    Objective 116/78 69 98.7 F (37.1 C) (Oral) 12 100%  Intake/Output Summary (Last 24 hours) at 07/10/2019 0749 Last data filed at 07/10/2019 0342 Gross per 24 hour  Intake 100 ml  Output 3001 ml  Net -2901 ml    Right groin dressing removed, groin soft without hematoma or bleeding Left doppler signals PT/peroneal Lungs non labored breathing Herat RR  Assessment/Planning: POD #2 Procedure Performed: 1. Ultrasound-guided access, right femoral artery 2. Abdominal aortogram 3. Bilateral lower extremity runoff 4. Shockwave intra-arterial lithotripsy, left superficial femoral artery 5. Shockwave intra-arterial lithotripsy, left common iliac artery 6. Drug-coated balloon angioplasty, left superficial femoral artery, left common iliac artery 7. Stent,Left common iliac artery 8.Stent, left external iliac artery 9.Conscious sedation 122 minutes  K+ stable post HD, chronic anemia HGB stable 9.8 No recurrent right groin bleeding Cont asa and Plavix Stable for discharge F/U in 2-3 weeks with Dr. Donnetta Hutching.  The patient will be brought back for percutaneous intervention of the right leg and possible treatment of the ostial left common iliac lesion when she recovers from this procedure.  Roxy Horseman 07/10/2019 7:49 AM --  Laboratory Lab Results: Recent Labs    07/08/19 1902 07/10/19 0411  WBC 7.5 9.1  HGB 10.5* 9.8*  HCT 34.1* 31.8*  PLT 279 267   BMET Recent Labs    07/08/19 1902  NA 137  K 3.8  CL 103  CO2 23  GLUCOSE 93  BUN 23  CREATININE 4.87*  CALCIUM 8.6*    COAG Lab Results  Component Value Date   INR 1.2 06/14/2019   INR 1.1 03/25/2019   INR 0.9 01/26/2019   No results found for: PTT  I have seen and evaluated the patient. I agree with the PA note as  documented above.  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  Right groin access looks better after bleed on Friday night.  Brisk dorsalis pedis signal in the left foot.  Remains on aspirin and Plavix which she will need at discharge.  Will need staged intervention of right leg.  Marty Heck, MD [facility] [place] Office: [phone]

## 2019-07-11 ENCOUNTER — Encounter (HOSPITAL_COMMUNITY): Payer: Self-pay | Admitting: Surgery

## 2019-07-11 LAB — RENAL FUNCTION PANEL
Albumin: 2 g/dL — ABNORMAL LOW (ref 3.5–5.0)
Anion gap: 11 (ref 5–15)
BUN: 18 mg/dL (ref 8–23)
CO2: 25 mmol/L (ref 22–32)
Calcium: 7.7 mg/dL — ABNORMAL LOW (ref 8.9–10.3)
Chloride: 101 mmol/L (ref 98–111)
Creatinine, Ser: 3.96 mg/dL — ABNORMAL HIGH (ref 0.44–1.00)
GFR calc Af Amer: 13 mL/min — ABNORMAL LOW (ref >60)
GFR calc non Af Amer: 11 mL/min — ABNORMAL LOW (ref >60)
Glucose, Bld: 143 mg/dL — ABNORMAL HIGH (ref 70–99)
Phosphorus: 2.3 mg/dL — ABNORMAL LOW (ref 2.5–4.6)
Potassium: 3.5 mmol/L (ref 3.5–5.1)
Sodium: 137 mmol/L (ref 135–145)

## 2019-07-11 LAB — CBC
HCT: 27.3 % — ABNORMAL LOW (ref 36.0–46.0)
Hemoglobin: 8.3 g/dL — ABNORMAL LOW (ref 12.0–15.0)
MCH: 29.2 pg (ref 26.0–34.0)
MCHC: 30.4 g/dL (ref 30.0–36.0)
MCV: 96.1 fL (ref 80.0–100.0)
Platelets: 235 10*3/uL (ref 150–400)
RBC: 2.84 MIL/uL — ABNORMAL LOW (ref 3.87–5.11)
RDW: 16.1 % — ABNORMAL HIGH (ref 11.5–15.5)
WBC: 5 10*3/uL (ref 4.0–10.5)
nRBC: 0 % (ref 0.0–0.2)

## 2019-07-11 MED ORDER — CHLORHEXIDINE GLUCONATE CLOTH 2 % EX PADS
6.0000 | MEDICATED_PAD | Freq: Every day | CUTANEOUS | Status: DC
  Administered 2019-07-11 – 2019-07-12 (×2): 6 via TOPICAL

## 2019-07-11 MED ORDER — CALCITRIOL 0.25 MCG PO CAPS
0.5000 ug | ORAL_CAPSULE | ORAL | Status: DC
  Administered 2019-07-13: 0.5 ug via ORAL

## 2019-07-11 MED ORDER — OXYCODONE-ACETAMINOPHEN 5-325 MG PO TABS
ORAL_TABLET | ORAL | Status: AC
  Administered 2019-07-12: 1 via ORAL
  Filled 2019-07-11: qty 1

## 2019-07-11 NOTE — TOC Progression Note (Signed)
Transition of Care [facility]) - Progression Note    Patient Details  Name: Christina [name] MRN: [id number] Date of Birth: 1950/05/28  Transition of Care [facility]) CM/SW [place], [place] Phone Number: 07/11/2019, 1:43 PM  Clinical Narrative:     Elisabeth Most - requested dialysis change form TTS to MWF- CSW informed CSW HD Coordinator C. Brigitte Pulse- patient is now scheduled for MWF@ 11:50 am  Thurmond Butts, MSW, [place] Social Worker   Expected Discharge Plan: Skilled Nursing Facility Barriers to Discharge: Continued Medical Work up, Ship broker  Expected Discharge Plan and Services Expected Discharge Plan: [place] In-house Referral: Clinical Social Work   Post Acute Care Choice: [place] Living arrangements for the past 2 months: Apartment                                       Social Determinants of Health (SDOH) Interventions    Readmission Risk Interventions Readmission Risk Prevention Plan 07/05/2019 06/17/2019 03/29/2019  Transportation Screening Complete Complete Complete  Medication Review Press photographer) Complete Complete Complete  PCP or Specialist appointment within 3-5 days of discharge Complete - -  [place] or Home Care Consult Complete Complete Complete  SW Recovery Care/Counseling Consult Complete Complete Complete  Palliative Care Screening Not Applicable Not Applicable Not Applicable  Skilled Nursing Facility Complete (No Data) Not Applicable  Some recent data might be hidden

## 2019-07-11 NOTE — Progress Notes (Addendum)
[facility]  PROGRESS NOTE  Christina [name] OAC:[id number] DOB: 20-Mar-1950 DOA: 06/29/2019 PCP: Maggie Schwalbe, PA-C   Brief HPI:   69 year old female with medical history significant of hypertension, hyperlipidemia, CAD, atrial fibrillation, COPD, ESRD on HD TTS, SBO, rectal prolapse, polysubstance abuse including cocaine who initially presented with complaints of rectal pain aggressively worsening over the past 4 days.  Patient missed her hemodialysis due to pain.  She is followed by Dr. Oneida Alar vascular surgery who has been contemplating amputating her toes.  Patient has had several bloody bowel movements while she was in the hospital.  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  She was hospitalized from 05/10/2019 to 05/12/2019 with acute respiratory failure secondary to volume overload and COPD exacerbation.  4/25-4/27 at [facility] for atrial fibrillation with RVR, no longer on anticoagulation due to large duodenal ulcer.  5/8-5/9, hospitalized for peripheral arterial disease and gangrene of left foot for which she was discharged to follow with Dr. Oneida Alar.  Lastly hospitalized from 5/18-5/24 with GI bleed found to have several deep duodenal ulcers without underlying active sign of bleeding. Patient was found to be hypotensive on admission with BP 76/52, which responded to PRBC.  But she was continued to have melena during hospital stay.  She was seen by GI, hemoglobin stabilized after units PRBC.  Plan for endoscopy per GI and arteriogram per vascular surgery.   Subjective   Patient seen and examined, no new complaints.   Assessment/Plan:     1. GI bleed/acute blood loss anemia-endoscopy showed duodenal ulcer with esophageal plaques.  Biopsies are pending.  Patient was transfused 3 units PRBC.   Hemoglobin is stable at 9.7.  Aspirin and Plavix are currently on hold.  It can be resumed in 3 days per GI if indicated.  Aspirin and Plavix has been restarted. 2. Peripheral vascular disease with ischemic changes without gangrene-s/p stent placement in left common iliac artery, left external iliac artery vascular surgery following.  Continue aspirin and Plavix 3. Delirium-resolved, likely at baseline.  Patient had multiple episodes of delirium for which she required IM Haldol.  We will continue to monitor. 4. ESRD on hemodialysis-nephrology following. 5. Rectal prolapse-acute on chronic, lidocaine jelly as needed. 6. Paroxysmal atrial fibrillation-she is not on anticoagulation due to history of GI bleed, continue amiodarone, metoprolol.  Patient will start on Cardizem on 07/06/2019.  She was not taking Cardizem at home.  Will discontinue Cardizem for bradycardia. 7. COPD without acute exacerbation-continue Dulera, as needed DuoNebs. 8. Hyperlipidemia-continue pravastatin. 9. Hypertension-blood pressure was elevated, dose of metoprolol was changed to 50 mg twice a day.     SpO2: 99 % O2 Flow Rate (L/min): 0 L/min FiO2 (%): 21 %   COVID-19 Labs  No results for input(s): DDIMER, FERRITIN, LDH, CRP in the last 72 hours.  Lab Results  Component Value Date   SARSCOV2NAA NEGATIVE 06/29/2019   [place] NEGATIVE 06/14/2019   [place] NEGATIVE 05/10/2019   [place] NEGATIVE 04/23/2019     CBG: No results for input(s): GLUCAP in the last 168 hours.  CBC: Recent Labs  Lab 07/06/19 0450 07/07/19 0434 07/08/19 0500 07/08/19 1902 07/10/19 0411  WBC 6.8 5.5 5.7 7.5 9.1  HGB 9.7* 10.7* 10.4* 10.5* 9.8*  HCT 31.0* 34.7* 32.8* 34.1* 31.8*  MCV 95.4 96.7 95.3 96.3 94.4  PLT 271 296  275 279 673    Basic Metabolic Panel: Recent Labs  Lab 07/06/19 0757 07/07/19 0434 07/08/19 1902  NA 136 137 137  K 4.0 3.5 3.8  CL 102 101 103  CO2 24 25 23   GLUCOSE 88 91 93  BUN 27* 11 23   CREATININE 4.25* 3.04* 4.87*  CALCIUM 8.5* 8.4* 8.6*  PHOS 3.4  --  5.0*     Liver Function Tests: Recent Labs  Lab 07/06/19 0757 07/08/19 1902  ALBUMIN 2.0* 2.1*    DVT prophylaxis: SCDs  Code Status: Full code  Family Communication: No family at bedside    Status is: Inpatient  Dispo: The patient is from: Home              Anticipated d/c is to: Skilled nursing facility              Anticipated d/c date is: 07/12/2019              Patient is medically stable for discharge  Barrier to discharge-awaiting bed at skilled nursing facility     Scheduled medications:  . allopurinol  100 mg Oral Daily  . amiodarone  200 mg Oral BID  . aspirin EC  81 mg Oral Daily  . [START ON 07/13/2019] calcitRIOL  0.5 mcg Oral Q M,W,F-HD  . Chlorhexidine Gluconate Cloth  6 each Topical Q0600  . cholecalciferol  2,000 Units Oral Daily  . clopidogrel  75 mg Oral Q breakfast  . darbepoetin (ARANESP) injection - DIALYSIS  150 mcg Intravenous Q Sat-HD  . dicyclomine  10 mg Oral QHS  . docusate sodium  100 mg Oral BID  . feeding supplement (NEPRO CARB STEADY)  237 mL Oral BID BM  . ferric citrate  630 mg Oral TID WC  . isosorbide mononitrate  30 mg Oral QHS  . LORazepam  1 mg Intravenous Once  . metoprolol tartrate  50 mg Oral BID  . mometasone-formoterol  2 puff Inhalation BID  . pantoprazole  40 mg Oral Q12H  . pravastatin  80 mg Oral QPM  . sodium chloride flush  3 mL Intravenous Q12H  . sucralfate  1 g Oral Q6H    Consultants:  Gastroenterology  Vascular surgery  Procedures:  Endoscopy 07/05/2019  Antibiotics:   Anti-infectives (From admission, onward)   None       Objective   Vitals:   07/11/19 0817 07/11/19 0910 07/11/19 1022 07/11/19 1052  BP:  122/73 127/83 (!) 173/54  Pulse:  (!) 53  61  Resp:  18  (!) 21  Temp:  98.5 F (36.9 C)    TempSrc:  Oral    SpO2: 100% 98%  99%  Weight:      Height:        Intake/Output Summary (Last 24 hours) at  07/11/2019 1216 Last data filed at 07/11/2019 1023 Gross per 24 hour  Intake 243 ml  Output 3 ml  Net 240 ml    06/12 1901 - 06/14 0700 In: 340 [P.O.:240] Out: 3002 [Urine:1]  Filed Weights   07/07/19 0401 07/10/19 0419 07/11/19 0610  Weight: 41.9 kg 43.3 kg 40.3 kg    Physical Examination:   General-appears in no acute distress Heart-S1-S2, regular, no murmur auscultated Lungs-clear to auscultation bilaterally, no wheezing or crackles auscultated Abdomen-soft, nontender, no organomegaly Extremities-no edema in the lower extremities Neuro-alert, oriented x3, no focal deficit noted   Data Reviewed:   Cardiac Enzymes: No results for input(s): CKTOTAL, CKMB, CKMBINDEX, TROPONINI in  the last 168 hours. BNP (last 3 results) Recent Labs    01/24/19 2228 02/04/19 0103 03/25/19 1313  BNP 1,570.0* 4,197.2* 2,007.2*    [place]   [facility] If 7PM-7AM, please contact night-coverage at [email or web address], Office  [phone]   07/11/2019, 12:16 PM  LOS: 11 days

## 2019-07-11 NOTE — Progress Notes (Signed)
[facility] Progress Note   Subjective:   Patient seen in room. HR in the 50's this AM before meds. Patient denies SOB, CP, palpitations, dizziness, abdominal pain, N/V/D. Reports no further episodes of blood in stool. Current plan is for SNF.   Objective Vitals:   07/11/19 0610 07/11/19 0817 07/11/19 0910 07/11/19 1022  BP:   122/73 127/83  Pulse: (!) 59  (!) 53   Resp: (!) 26  18   Temp:   98.5 F (36.9 C)   TempSrc:   Oral   SpO2: 97% 100% 98%   Weight: 40.3 kg     Height:       Physical Exam General:Well developed female, alert and in NAD Heart:RRR, no murmurs, rubs or gallops Lungs:CTA bilaterally without wheezing, rhonchi or rales Abdomen:Soft, non-tender, non-distended, +BS Extremities: No edema b/l lower extremities Dialysis Access:TDC, LUE AVF + bruit  Additional Objective Labs: Basic Metabolic Panel: Recent Labs  Lab 07/06/19 0757 07/07/19 0434 07/08/19 1902  NA 136 137 137  K 4.0 3.5 3.8  CL 102 101 103  CO2 24 25 23   GLUCOSE 88 91 93  BUN 27* 11 23  CREATININE 4.25* 3.04* 4.87*  CALCIUM 8.5* 8.4* 8.6*  PHOS 3.4  --  5.0*   Liver Function Tests: Recent Labs  Lab 07/06/19 0757 07/08/19 1902  ALBUMIN 2.0* 2.1*   CBC: Recent Labs  Lab 07/06/19 0450 07/06/19 0450 07/07/19 0434 07/07/19 0434 07/08/19 0500 07/08/19 1902 07/10/19 0411  WBC 6.8   < > 5.5   < > 5.7 7.5 9.1  HGB 9.7*   < > 10.7*   < > 10.4* 10.5* 9.8*  HCT 31.0*   < > 34.7*   < > 32.8* 34.1* 31.8*  MCV 95.4  --  96.7  --  95.3 96.3 94.4  PLT 271   < > 296   < > 275 279 267   < > = values in this interval not displayed.   Blood Culture    Component Value Date/Time   SDES BLOOD RIGHT HAND 06/17/2019 1223   [place]  06/17/2019 1223    BOTTLES DRAWN AEROBIC ONLY Blood Culture results may not be optimal due to an inadequate volume of blood received in culture bottles   CULT  06/17/2019 1223    NO GROWTH 5 DAYS Performed at [facility], [place] [address], [place], [place] [address]    REPTSTATUS 06/22/2019 FINAL 06/17/2019 1223   Medications: . sodium chloride    . ferric gluconate (FERRLECIT/NULECIT) IV Stopped (07/06/19 1101)   . allopurinol  100 mg Oral Daily  . amiodarone  200 mg Oral BID  . aspirin EC  81 mg Oral Daily  . calcitRIOL  1 mcg Oral Q M,W,F-HD  . Chlorhexidine Gluconate Cloth  6 each Topical Q0600  . cholecalciferol  2,000 Units Oral Daily  . clopidogrel  75 mg Oral Q breakfast  . darbepoetin (ARANESP) injection - DIALYSIS  150 mcg Intravenous Q Sat-HD  . dicyclomine  10 mg Oral QHS  . docusate sodium  100 mg Oral BID  . feeding supplement (NEPRO CARB STEADY)  237 mL Oral BID BM  . ferric citrate  630 mg Oral TID WC  . isosorbide mononitrate  30 mg Oral QHS  . LORazepam  1 mg Intravenous Once  . metoprolol tartrate  50 mg Oral BID  . mometasone-formoterol  2 puff Inhalation BID  . pantoprazole  40 mg Oral Q12H  . pravastatin  80 mg Oral QPM  . sodium chloride flush  3 mL Intravenous Q12H  . sucralfate  1 g Oral Q6H    Dialysis Orders: [facility] TTS 4 hr EDW 48 kg F180 BFR 350 mL/ min DFR A1.5 UF profile 2no heparin Venofer 100 mg q rx x 9, started 5/27 Mircera 200 q 2 weeks, last given 06/02/19 Calcitriol 1.0 cmg q rx  Assessment/Plan: 1.Recurrent GIB - melanoic stools on HD with abdominal pain -s/p 4 units PRBC GI following- presumed bleeding from previously diagnosed severe duodenal ulcerations,recent acute GIB in May with multiple deep duodenal ulcers -On PPI bid.Repeat EGD 6/8 showed multiple plagues in the mid and distal esophagus, smal HH, two non-bleeding duodenal ulcers. No heparin. Hgb a bit lower 6/13 but may be due to post op bleeding.  2. ESRD- TTS schedule, next HD 6/15.K+ 3.8. AVF working well, has tolerated 16g needles on Saturday, will attempt 15g on Tuesday.  3. Anemia-Acute on chronic 2/2 #1.hgbstabilized with slight post op drop.Aranesp 150mg  q Saturday,s/p course of  Fe. 4. Secondary hyperparathyroidism- Corrected calcium 10.1, reduce calcitriol dose. Phos is at goal, continue binders.  5.HTN/volumeVolume overloaded on admitbut now much improved. Weights variable here, will follow post HD. Needs a new EDW at discharge.Continuehome dilt 120 daily , cont metoprolol. Cardizem d/c due to bradycardia.  6. Nutrition-On renal diet, protein supplements and vitamin. 7. Mesenteric ischemia s/p SMA stent 05/2019 8.PAD -ischemic left 4th and 5th toes, s/p arteriogram 6/11 with prolonged bleeding, now resolved. Per vascular 9. Atrial fibrillation -- on amiodarone/metoprolol 10. Hx cocaine abuse - UDS negative  11. 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 DNR 13. Disp -tentative SNF   Anice Paganini, PA-C 07/11/2019, 10:42 AM  [facility] Pager: [phone]

## 2019-07-11 NOTE — Progress Notes (Addendum)
Renal Navigator received call from CSW/C. Wynetta Emery that patient will discharge to [facility] and therefore she requires a change from OP HD TTS schedule to MWF to accommodate transportation from SNF. Per CSW, there is good change patient may be able to discharge tomorrow to SNF-awaiting COVID test and insurance authorization. Navigator spoke with OP HD clinic/[facility] Manager, who has changed patient's seat time to MWF 11:50am to accommodate discharge to SNF. Navigator informed CSW. Navigator spoke with inpt HD Charge RN to see if we can accommodate patient in HD unit today in order to get her treatment switched to new OP HD schedule. Charge RN agrees-message sent to Nephrologist/Dr. Jonnie Finner, who also agrees and wrote orders for today instead of tomorrow. Navigator Transport planner. Renal Navigator met with patient when she arrived to HD. She is very appreciative to be switched to her new schedule today vs Wednesday since this means she will not need to have HD two days in a row this way.   Alphonzo Cruise, [place] Renal Navigator [phone]

## 2019-07-11 NOTE — TOC Progression Note (Signed)
Transition of Care [facility]) - Progression Note    Patient Details  Name: Christina [name] MRN: [id number] Date of Birth: 05-09-1950  Transition of Care [facility]) CM/SW [place], [place] Phone Number: 07/11/2019, 1:06 PM  Clinical Narrative:     Insurance still pending ref# [id number] [facility] confirmed bed offer-CSW informed dialysis patient TTS, 11:30am RN updated and informed covid test needed.  Thurmond Butts, MSW, [place] Clinical Social Worker     Expected Discharge Plan: Skilled Nursing Facility Barriers to Discharge: Continued Medical Work up, Ship broker  Expected Discharge Plan and Services Expected Discharge Plan: [place] In-house Referral: Clinical Social Work   Post Acute Care Choice: [place] Living arrangements for the past 2 months: Apartment                                       Social Determinants of Health (SDOH) Interventions    Readmission Risk Interventions Readmission Risk Prevention Plan 07/05/2019 06/17/2019 03/29/2019  Transportation Screening Complete Complete Complete  Medication Review Press photographer) Complete Complete Complete  PCP or Specialist appointment within 3-5 days of discharge Complete - -  [place] or Home Care Consult Complete Complete Complete  SW Recovery Care/Counseling Consult Complete Complete Complete  Palliative Care Screening Not Applicable Not Applicable Not Applicable  Skilled Nursing Facility Complete (No Data) Not Applicable  Some recent data might be hidden

## 2019-07-11 NOTE — Progress Notes (Addendum)
  Progress Note    07/11/2019 7:38 AM 3 Days Post-Op  Subjective:     Vitals:   07/11/19 0543 07/11/19 0610  BP: (!) 140/57   Pulse: (!) 57 (!) 59  Resp: 14 (!) 26  Temp:    SpO2: 98% 97%   Physical Exam Lungs:  Non labored Incisions:  R groin without firm hematoma; palpable R femoral pulse Extremities:  DP and PT signals BLE by doppler Abdomen:  Soft, NT, ND Neurologic: A&O  CBC    Component Value Date/Time   WBC 9.1 07/10/2019 0411   RBC 3.37 (L) 07/10/2019 0411   HGB 9.8 (L) 07/10/2019 0411   HCT 31.8 (L) 07/10/2019 0411   PLT 267 07/10/2019 0411   MCV 94.4 07/10/2019 0411   MCH 29.1 07/10/2019 0411   MCHC 30.8 07/10/2019 0411   RDW 16.5 (H) 07/10/2019 0411   LYMPHSABS 0.9 06/29/2019 1610   MONOABS 0.5 06/29/2019 1610   EOSABS 0.1 06/29/2019 1610   BASOSABS 0.1 06/29/2019 1610    BMET    Component Value Date/Time   NA 137 07/08/2019 1902   K 3.8 07/08/2019 1902   CL 103 07/08/2019 1902   CO2 23 07/08/2019 1902   GLUCOSE 93 07/08/2019 1902   BUN 23 07/08/2019 1902   CREATININE 4.87 (H) 07/08/2019 1902   CALCIUM 8.6 (L) 07/08/2019 1902   GFRNONAA 8 (L) 07/08/2019 1902   GFRAA 10 (L) 07/08/2019 1902    INR    Component Value Date/Time   INR 1.2 06/14/2019 1010     Intake/Output Summary (Last 24 hours) at 07/11/2019 0738 Last data filed at 07/11/2019 0543 Gross per 24 hour  Intake 240 ml  Output 2 ml  Net 238 ml     Assessment/Plan:  69 y.o. female is s/p     4. Shockwave intra-arterial lithotripsy, left superficial femoral artery 5. Shockwave intra-arterial lithotripsy, left common iliac artery 6. Drug-coated balloon angioplasty, left superficial femoral artery, left common iliac artery 7. Stent,Left common iliac artery 8.Stent, left external iliac artery 3 Days Post-Op   PT and DP signals by doppler BLE Toe wounds stable Will need aspirin and plavix at discharge Plan is for office f/u in 2 weeks; at that time RLE  angiography will be scheduled   Dagoberto Ligas, PA-C [facility] [phone] 07/11/2019 7:38 AM  I agree with the above.  I have seen and examined the patient.  She feels her left leg is better.  We discussed that she will need right leg intervention.  SHe would like to get this done while she is in the hospital,  I will try and do this tomorrow.   Annamarie Major

## 2019-07-12 ENCOUNTER — Encounter (HOSPITAL_COMMUNITY)
Admission: EM | Disposition: A | Discharge status: Skilled Nursing Facility | Payer: Self-pay | Source: Home / Self Care | Attending: Family Medicine

## 2019-07-12 ENCOUNTER — Other Ambulatory Visit: Payer: Self-pay

## 2019-07-12 DIAGNOSIS — K559 Vascular disorder of intestine, unspecified: Secondary | ICD-10-CM

## 2019-07-12 DIAGNOSIS — I739 Peripheral vascular disease, unspecified: Secondary | ICD-10-CM

## 2019-07-12 HISTORY — PX: PERIPHERAL INTRAVASCULAR LITHOTRIPSY: CATH118324

## 2019-07-12 HISTORY — PX: PERIPHERAL VASCULAR BALLOON ANGIOPLASTY: CATH118281

## 2019-07-12 LAB — CBC
HCT: 33.4 % — ABNORMAL LOW (ref 36.0–46.0)
Hemoglobin: 10 g/dL — ABNORMAL LOW (ref 12.0–15.0)
MCH: 28.4 pg (ref 26.0–34.0)
MCHC: 29.9 g/dL — ABNORMAL LOW (ref 30.0–36.0)
MCV: 94.9 fL (ref 80.0–100.0)
Platelets: 259 10*3/uL (ref 150–400)
RBC: 3.52 MIL/uL — ABNORMAL LOW (ref 3.87–5.11)
RDW: 16.1 % — ABNORMAL HIGH (ref 11.5–15.5)
WBC: 6.6 10*3/uL (ref 4.0–10.5)
nRBC: 0 % (ref 0.0–0.2)

## 2019-07-12 LAB — BASIC METABOLIC PANEL
Anion gap: 9 (ref 5–15)
BUN: <5 mg/dL — ABNORMAL LOW (ref 8–23)
CO2: 28 mmol/L (ref 22–32)
Calcium: 8.1 mg/dL — ABNORMAL LOW (ref 8.9–10.3)
Chloride: 104 mmol/L (ref 98–111)
Creatinine, Ser: 2.28 mg/dL — ABNORMAL HIGH (ref 0.44–1.00)
GFR calc Af Amer: 25 mL/min — ABNORMAL LOW (ref >60)
GFR calc non Af Amer: 21 mL/min — ABNORMAL LOW (ref >60)
Glucose, Bld: 96 mg/dL (ref 70–99)
Potassium: 3.9 mmol/L (ref 3.5–5.1)
Sodium: 141 mmol/L (ref 135–145)

## 2019-07-12 LAB — POCT ACTIVATED CLOTTING TIME
Activated Clotting Time: 186 seconds
Activated Clotting Time: 158 seconds

## 2019-07-12 SURGERY — INTRAVASCULAR LITHOTRIPSY
Anesthesia: LOCAL | Laterality: Right

## 2019-07-12 MED ORDER — FENTANYL CITRATE (PF) 100 MCG/2ML IJ SOLN
INTRAMUSCULAR | Status: AC
  Filled 2019-07-12: qty 2

## 2019-07-12 MED ORDER — ISOSORBIDE MONONITRATE ER 30 MG PO TB24
30.0000 mg | ORAL_TABLET | Freq: Every day | ORAL | Status: DC
  Administered 2019-07-13: 30 mg via ORAL
  Filled 2019-07-12 (×2): qty 1

## 2019-07-12 MED ORDER — METOPROLOL TARTRATE 50 MG PO TABS
50.0000 mg | ORAL_TABLET | Freq: Two times a day (BID) | ORAL | Status: DC
  Administered 2019-07-13 – 2019-07-14 (×3): 50 mg via ORAL
  Filled 2019-07-12 (×4): qty 1

## 2019-07-12 MED ORDER — ALLOPURINOL 100 MG PO TABS
100.0000 mg | ORAL_TABLET | Freq: Every day | ORAL | Status: DC
  Administered 2019-07-13 – 2019-07-14 (×2): 100 mg via ORAL
  Filled 2019-07-12 (×2): qty 1

## 2019-07-12 MED ORDER — SODIUM CHLORIDE 0.9% FLUSH
3.0000 mL | Freq: Two times a day (BID) | INTRAVENOUS | Status: DC
  Administered 2019-07-13 – 2019-07-14 (×3): 3 mL via INTRAVENOUS

## 2019-07-12 MED ORDER — DOCUSATE SODIUM 100 MG PO CAPS
100.0000 mg | ORAL_CAPSULE | Freq: Two times a day (BID) | ORAL | Status: DC
  Administered 2019-07-13 – 2019-07-14 (×2): 100 mg via ORAL
  Filled 2019-07-12 (×4): qty 1

## 2019-07-12 MED ORDER — FENTANYL CITRATE (PF) 100 MCG/2ML IJ SOLN
25.0000 ug | Freq: Once | INTRAMUSCULAR | Status: AC
  Administered 2019-07-13: 25 ug via INTRAVENOUS
  Filled 2019-07-12: qty 2

## 2019-07-12 MED ORDER — LIDOCAINE HCL (PF) 1 % IJ SOLN
INTRAMUSCULAR | Status: DC | PRN
  Administered 2019-07-12: 10 mL

## 2019-07-12 MED ORDER — PRAVASTATIN SODIUM 40 MG PO TABS
80.0000 mg | ORAL_TABLET | Freq: Every evening | ORAL | Status: DC
  Administered 2019-07-13: 80 mg via ORAL
  Filled 2019-07-12: qty 2

## 2019-07-12 MED ORDER — FERRIC CITRATE 1 GM 210 MG(FE) PO TABS: 630.0000 mg | ORAL_TABLET | Freq: Three times a day (TID) | ORAL | Status: DC

## 2019-07-12 MED ORDER — LABETALOL HCL 5 MG/ML IV SOLN: 10.0000 mg | INTRAVENOUS | Status: DC | PRN

## 2019-07-12 MED ORDER — HEPARIN SODIUM (PORCINE) 1000 UNIT/ML IJ SOLN
INTRAMUSCULAR | Status: AC
  Filled 2019-07-12: qty 1

## 2019-07-12 MED ORDER — LABETALOL HCL 5 MG/ML IV SOLN
INTRAVENOUS | Status: AC
  Filled 2019-07-12: qty 4

## 2019-07-12 MED ORDER — ACETAMINOPHEN 325 MG PO TABS: 650.0000 mg | ORAL_TABLET | ORAL | Status: DC | PRN

## 2019-07-12 MED ORDER — MIDAZOLAM HCL 2 MG/2ML IJ SOLN
INTRAMUSCULAR | Status: AC
  Filled 2019-07-12: qty 2

## 2019-07-12 MED ORDER — HYDRALAZINE HCL 20 MG/ML IJ SOLN: 5.0000 mg | INTRAMUSCULAR | Status: DC | PRN

## 2019-07-12 MED ORDER — IODIXANOL 320 MG/ML IV SOLN
INTRAVENOUS | Status: DC | PRN
  Administered 2019-07-12: 70 mL

## 2019-07-12 MED ORDER — FERRIC CITRATE 1 GM 210 MG(FE) PO TABS
210.0000 mg | ORAL_TABLET | Freq: Three times a day (TID) | ORAL | Status: DC
  Administered 2019-07-13 – 2019-07-14 (×5): 210 mg via ORAL
  Filled 2019-07-12 (×5): qty 1

## 2019-07-12 MED ORDER — VITAMIN D 25 MCG (1000 UNIT) PO TABS
2000.0000 [IU] | ORAL_TABLET | Freq: Every day | ORAL | Status: DC
  Administered 2019-07-13 – 2019-07-14 (×2): 2000 [IU] via ORAL
  Filled 2019-07-12 (×2): qty 2

## 2019-07-12 MED ORDER — SODIUM CHLORIDE 0.9% FLUSH: 3.0000 mL | INTRAVENOUS | Status: DC | PRN

## 2019-07-12 MED ORDER — MIDAZOLAM HCL 2 MG/2ML IJ SOLN
INTRAMUSCULAR | Status: DC | PRN
  Administered 2019-07-12: 2 mg via INTRAVENOUS
  Administered 2019-07-12: 1 mg via INTRAVENOUS

## 2019-07-12 MED ORDER — HEPARIN (PORCINE) IN NACL 1000-0.9 UT/500ML-% IV SOLN
INTRAVENOUS | Status: DC | PRN
  Administered 2019-07-12 (×2): 500 mL

## 2019-07-12 MED ORDER — SUCRALFATE 1 GM/10ML PO SUSP
1.0000 g | Freq: Four times a day (QID) | ORAL | Status: DC
  Administered 2019-07-12 – 2019-07-14 (×7): 1 g via ORAL
  Filled 2019-07-12 (×7): qty 10

## 2019-07-12 MED ORDER — HEPARIN (PORCINE) IN NACL 1000-0.9 UT/500ML-% IV SOLN
INTRAVENOUS | Status: AC
  Filled 2019-07-12: qty 1000

## 2019-07-12 MED ORDER — AMIODARONE HCL 200 MG PO TABS
200.0000 mg | ORAL_TABLET | Freq: Two times a day (BID) | ORAL | Status: DC
  Administered 2019-07-13 – 2019-07-14 (×3): 200 mg via ORAL
  Filled 2019-07-12 (×5): qty 1

## 2019-07-12 MED ORDER — SODIUM CHLORIDE 0.9 % IV SOLN: 250.0000 mL | INTRAVENOUS | Status: DC | PRN

## 2019-07-12 MED ORDER — HEPARIN SODIUM (PORCINE) 1000 UNIT/ML IJ SOLN
INTRAMUSCULAR | Status: DC | PRN
  Administered 2019-07-12: 4500 [IU] via INTRAVENOUS

## 2019-07-12 MED ORDER — LIDOCAINE HCL (PF) 1 % IJ SOLN
INTRAMUSCULAR | Status: AC
  Filled 2019-07-12: qty 30

## 2019-07-12 MED ORDER — DICYCLOMINE HCL 10 MG PO CAPS
10.0000 mg | ORAL_CAPSULE | Freq: Every day | ORAL | Status: DC
  Administered 2019-07-13: 10 mg via ORAL
  Filled 2019-07-12 (×3): qty 1

## 2019-07-12 MED ORDER — CHLORHEXIDINE GLUCONATE CLOTH 2 % EX PADS
6.0000 | MEDICATED_PAD | Freq: Every day | CUTANEOUS | Status: DC
  Administered 2019-07-12 – 2019-07-14 (×3): 6 via TOPICAL

## 2019-07-12 MED ORDER — SODIUM CHLORIDE 0.9 % IV SOLN: INTRAVENOUS | Status: DC

## 2019-07-12 MED ORDER — FENTANYL CITRATE (PF) 100 MCG/2ML IJ SOLN
INTRAMUSCULAR | Status: DC | PRN
  Administered 2019-07-12 (×2): 50 ug via INTRAVENOUS
  Administered 2019-07-12: 25 ug via INTRAVENOUS

## 2019-07-12 MED ORDER — ONDANSETRON HCL 4 MG/2ML IJ SOLN: 4.0000 mg | Freq: Four times a day (QID) | INTRAMUSCULAR | Status: DC | PRN

## 2019-07-12 MED ORDER — MOMETASONE FURO-FORMOTEROL FUM 200-5 MCG/ACT IN AERO
2.0000 | INHALATION_SPRAY | Freq: Two times a day (BID) | RESPIRATORY_TRACT | Status: DC
  Administered 2019-07-12 – 2019-07-14 (×3): 2 via RESPIRATORY_TRACT
  Filled 2019-07-12 (×2): qty 8.8

## 2019-07-12 SURGICAL SUPPLY — 18 items
CATH OMNI FLUSH 5F 65CM (CATHETERS) ×2 IMPLANT
CATH SHOCKWAVE S4 4.0X40 (CATHETERS) ×2 IMPLANT
DCB RANGER 4.0X200 150 (BALLOONS) ×1 IMPLANT
DEVICE TORQUE H2O (MISCELLANEOUS) ×2 IMPLANT
GUIDEWIRE ANGLED .035X150CM (WIRE) ×2 IMPLANT
GUIDEWIRE ZILIENT 6G 014 (WIRE) ×2 IMPLANT
KIT ENCORE 26 ADVANTAGE (KITS) ×2 IMPLANT
KIT MICROPUNCTURE NIT STIFF (SHEATH) ×4 IMPLANT
KIT PV (KITS) ×3 IMPLANT
RANGER DCB 4.0X200 150 (BALLOONS) ×3
SHEATH PINNACLE MP 6F 45CM (SHEATH) ×2 IMPLANT
SHEATH PROBE COVER 6X72 (BAG) ×2 IMPLANT
SYR MEDRAD MARK V 150ML (SYRINGE) ×2 IMPLANT
TAPE VIPERTRACK RADIOPAQ (MISCELLANEOUS) ×1 IMPLANT
TAPE VIPERTRACK RADIOPAQUE (MISCELLANEOUS) ×3
TRANSDUCER W/STOPCOCK (MISCELLANEOUS) ×3 IMPLANT
TRAY PV CATH (CUSTOM PROCEDURE TRAY) ×3 IMPLANT
WIRE HI TORQ VERSACORE J 260CM (WIRE) ×2 IMPLANT

## 2019-07-12 NOTE — Op Note (Signed)
.     Patient name: Christina [name] MRN: [id number] DOB: 1950/12/21 Sex: female  07/12/2019 Pre-operative Diagnosis: right leg rest pain Post-operative diagnosis:  Same Surgeon:  Annamarie Major Procedure Performed:  1.  Ultrasound-guided access, left femoral artery  2.  Shockwave intra-arterial lithotripsy, right superficial femoral artery  3.  Drug-coated balloon angioplasty, right superficial femoral artery  4.  Conscious sedation, 52 minutes    Indications: The patient underwent last week with intervention of the left leg.  She comes back today for right leg intervention.  Procedure:  The patient was identified in the holding area and taken to room 8.  The patient was then placed supine on the table and prepped and draped in the usual sterile fashion.  A time out was called.  Conscious sedation was administered with the use of IV fentanyl and Versed under continuous physician and nurse monitoring.  Heart rate, blood pressure, and oxygen saturation were continuously monitored.  Total sedation time was 52 minutes.  Ultrasound was used to evaluate the left common femoral artery.  It was patent .  A digital ultrasound image was acquired.  A micropuncture needle was used to access the left common femoral artery under ultrasound guidance.  An 018 wire was advanced without resistance and a micropuncture sheath was placed.  The 018 wire was removed and a benson wire was placed.  The micropuncture sheath was exchanged for a 6 French 45 cm Terumo sheath.  A versa core wire was advanced over the aortic bifurcation which was followed by the 6 French sheath which was placed into the right superficial femoral artery.  The patient was then fully heparinized.  Next, right leg runoff was performed Findings:    Right Lower Extremity: Right common femoral and profundofemoral artery widely patent.  The superficial femoral artery is patent however the proximal third of the artery has luminal narrowing draining then  50%.  At the adductor canal there is a short segment lesion which is heavily calcified with greater than 70% stenosis.  The anterior tibial is the dominant runoff vessel.    Intervention: At this point, the patient was fully heparinized.  I advanced a 014 wire across the lesions into the below-knee popliteal artery.  I selected a 4 x 40 shockwave lithotripsy balloon and perform balloon angioplasty with lithotripsy in the superficial femoral artery for lesion length of approximately 200 cm.  Once this was completed at 2 and 4 atm on the balloon, a Ranger 4 x 200 balloon was inserted and drug-coated balloon angioplasty was performed for 3 minutes.  Completion imaging showed inline flow with residual stenosis less than 10%.  There was a nonflow limiting dissection at the proximal and distal edge of the intervention which I elected to monitor.  I did not want to put a stent in because of the small size of her arteries.  At this point sheath and wire were removed.  The patient particularly here for sheath electrocoagulation profile correct  Impression:  #1 successful shockwave intra-arterial lithotripsy and subsequent drug-coated balloon angioplasty using 4 mm balloons to the right superficial femoral artery with minimal residual stenosis.  There was a nonflow limiting dissection that was not intervened on.     Theotis Burrow, M.D., [facility] [place] Office: [phone] Pager:  [phone]

## 2019-07-12 NOTE — Progress Notes (Signed)
Site area- left  Site Prior to Removal- 0   Pressure Applied For-  60 MInutes   Bedrest Beginning at - 1830   Manual- Yes   Patient Status During Pull- Stable    Post Pull Groin Site- 1   Post Pull Instructions Given- Yes   Post Pull Pulses Present- Yes    Dressing Applied- Tegaderm and Gauze Dressing    Comments:  hematoma occurred during hold and when attempted to release x2 Site soft with dressing on

## 2019-07-12 NOTE — Progress Notes (Signed)
Progress Note    07/12/2019 7:37 AM 4 Days Post-Op  Subjective: She says her lower extremity pain has improved.  She says nursing staff will not allow her out of bed, but denies dizziness when sitting up.  She is anxious for discharge home and becomes tearful when discussing discharge.   Vitals:   07/12/19 0336 07/12/19 0415  BP: (!) 162/54 138/66  Pulse: 63 65  Resp: 13 18  Temp: 98.5 F (36.9 C)   SpO2: 100% 100%    Physical Exam: Cardiac: And rhythm are regular Lungs: Nonlabored Extremities: Both feet are warm.  Sensation and motor function intact Abdomen: Left groin puncture site inspected.  Tiny hematoma.  Mild ecchymosis.  CBC    Component Value Date/Time   WBC 5.0 07/11/2019 1450   RBC 2.84 (L) 07/11/2019 1450   HGB 8.3 (L) 07/11/2019 1450   HCT 27.3 (L) 07/11/2019 1450   PLT 235 07/11/2019 1450   MCV 96.1 07/11/2019 1450   MCH 29.2 07/11/2019 1450   MCHC 30.4 07/11/2019 1450   RDW 16.1 (H) 07/11/2019 1450   LYMPHSABS 0.9 06/29/2019 1610   MONOABS 0.5 06/29/2019 1610   EOSABS 0.1 06/29/2019 1610   BASOSABS 0.1 06/29/2019 1610    BMET    Component Value Date/Time   NA 141 07/12/2019 0234   K 3.9 07/12/2019 0234   CL 104 07/12/2019 0234   CO2 28 07/12/2019 0234   GLUCOSE 96 07/12/2019 0234   BUN <5 (L) 07/12/2019 0234   CREATININE 2.28 (H) 07/12/2019 0234   CALCIUM 8.1 (L) 07/12/2019 0234   GFRNONAA 21 (L) 07/12/2019 0234   GFRAA 25 (L) 07/12/2019 0234     Intake/Output Summary (Last 24 hours) at 07/12/2019 0737 Last data filed at 07/11/2019 1835 Gross per 24 hour  Intake 123 ml  Output 2001 ml  Net -1878 ml    HOSPITAL MEDICATIONS Scheduled Meds: . allopurinol  100 mg Oral Daily  . amiodarone  200 mg Oral BID  . aspirin EC  81 mg Oral Daily  . [START ON 07/13/2019] calcitRIOL  0.5 mcg Oral Q M,W,F-HD  . Chlorhexidine Gluconate Cloth  6 each Topical Q0600  . cholecalciferol  2,000 Units Oral Daily  . clopidogrel  75 mg Oral Q breakfast    . darbepoetin (ARANESP) injection - DIALYSIS  150 mcg Intravenous Q Sat-HD  . dicyclomine  10 mg Oral QHS  . docusate sodium  100 mg Oral BID  . feeding supplement (NEPRO CARB STEADY)  237 mL Oral BID BM  . ferric citrate  630 mg Oral TID WC  . isosorbide mononitrate  30 mg Oral QHS  . LORazepam  1 mg Intravenous Once  . metoprolol tartrate  50 mg Oral BID  . mometasone-formoterol  2 puff Inhalation BID  . pantoprazole  40 mg Oral Q12H  . pravastatin  80 mg Oral QPM  . sodium chloride flush  3 mL Intravenous Q12H  . sucralfate  1 g Oral Q6H   Continuous Infusions: . sodium chloride    . sodium chloride    . ferric gluconate (FERRLECIT/NULECIT) IV 125 mg (07/11/19 2050)   PRN Meds:.sodium chloride, acetaminophen, clobetasol cream, diphenhydrAMINE, haloperidol **OR** haloperidol lactate, hydrALAZINE, hydrALAZINE, ipratropium-albuterol, labetalol, nitroGLYCERIN, ondansetron (ZOFRAN) IV, ondansetron, oxyCODONE-acetaminophen, polyethylene glycol, senna-docusate, sodium chloride flush  Assessment: Lower extremity ischemia with rest pain.  Status post lithotripsy, Left CIA, EIA stents. On asa and Plavix. Recent GIB.    Plan: -Plan arteriogram RLE today.    Christina [name]  [facility] [phone] 07/12/2019  7:37 AM

## 2019-07-12 NOTE — Progress Notes (Signed)
Pt arrived from unit from cath lab  PACU. VSS Pt. Oriented to unit left groin level 1. .Will continue to monitor.  Phoebe Sharps, RN

## 2019-07-12 NOTE — Care Management Important Message (Signed)
Important Message  Patient Details  Name: Christina [name] MRN: [id number] Date of Birth: 1950/09/28   Medicare Important Message Given:  Yes     Shelda Altes 07/12/2019, 9:15 AM

## 2019-07-12 NOTE — Progress Notes (Addendum)
[facility]  PROGRESS NOTE  DERICA LEIBER RCV:[id number] DOB: [birth year] DOA: 06/29/2019 PCP: Maggie Schwalbe, PA-C   Brief HPI:   69 year old female with medical history significant of hypertension, hyperlipidemia, CAD, atrial fibrillation, COPD, ESRD on HD TTS, SBO, rectal prolapse, polysubstance abuse including cocaine who initially presented with complaints of rectal pain aggressively worsening over the past 4 days.  Patient missed her hemodialysis due to pain.  She is followed by Dr. Oneida Alar vascular surgery who has been contemplating amputating her toes.  Patient has had several bloody bowel movements while she was in the hospital.  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  She was hospitalized from 05/10/2019 to 05/12/2019 with acute respiratory failure secondary to volume overload and COPD exacerbation.  4/25-4/27 at [place] for atrial fibrillation with RVR, no longer on anticoagulation due to large duodenal ulcer.  5/8-5/9, hospitalized for peripheral arterial disease and gangrene of left foot for which she was discharged to follow with Dr. Oneida Alar.  Lastly hospitalized from 5/18-5/24 with GI bleed found to have several deep duodenal ulcers without underlying active sign of bleeding. Patient was found to be hypotensive on admission with BP 76/52, which responded to PRBC.  But she was continued to have melena during hospital stay.  She was seen by GI, underwent endoscopy and arteriogram with stent placement as per vascular surgery. .   Subjective   Patient seen examined, plan for right lower extremity arteriogram today.   Assessment/Plan:     1. GI bleed/acute blood loss anemia-endoscopy showed duodenal ulcer with esophageal plaques.  Biopsy was negative for malignancy, and consistent with  esophagitis.  Patient was transfused 4 units PRBC.  Hemoglobin is stable at 10.0 aspirin and Plavix has been restarted.  Continue Protonix 40 mg p.o. twice daily. 2. Peripheral vascular disease with ischemic changes without gangrene-s/p stent placement in left common iliac artery, left external iliac artery vascular surgery following.  Continue aspirin and Plavix.  Plan for right lower extremity arteriogram today. 3. Delirium-resolved, likely at baseline.  Patient had delirium during this hospitalization for which she required multiple doses of IM Haldol.  We will continue to monitor. 4. ESRD on hemodialysis-nephrology following. 5. Rectal prolapse-acute on chronic, lidocaine jelly as needed. 6. Paroxysmal atrial fibrillation-she is not on anticoagulation due to history of GI bleed, continue amiodarone, metoprolol.  Patient will start on Cardizem on 07/06/2019.  She was not taking Cardizem at home.  Patient became bradycardic, Cardizem has been discontinued.  Heart rate has improved. 7. COPD without acute exacerbation-continue Dulera, as needed DuoNebs. 8. Hyperlipidemia-continue pravastatin. 9. Hypertension-blood pressure was elevated, dose of metoprolol was changed to 50 mg twice a day.     SpO2: 98 % O2 Flow Rate (L/min): 0 L/min FiO2 (%): 21 %   COVID-19 Labs  No results for input(s): DDIMER, FERRITIN, LDH, CRP in the last 72 hours.  Lab Results  Component Value Date   SARSCOV2NAA NEGATIVE 06/29/2019   [place] NEGATIVE 06/14/2019   [place] NEGATIVE 05/10/2019   [place] NEGATIVE 04/23/2019     CBG: No results for input(s): GLUCAP in the last 168 hours.  CBC: Recent Labs  Lab 07/08/19 0500 07/08/19 1902 07/10/19 0411 07/11/19 1450 07/12/19 0735  WBC 5.7 7.5 9.1 5.0 6.6  HGB 10.4* 10.5* 9.8* 8.3* 10.0*  HCT 32.8* 34.1* 31.8* 27.3* 33.4*  MCV 95.3 96.3 94.4 96.1 94.9  PLT 275 279 267 235 540    Basic Metabolic Panel: Recent Labs  Lab 07/06/19 0757  07/07/19 0434 07/08/19 1902 07/11/19 1450 07/12/19 0234  NA 136 137 137 137 141  K 4.0 3.5 3.8 3.5 3.9  CL 102 101 103 101 104  CO2 24 25 23 25 28   GLUCOSE 88 91 93 143* 96  BUN 27* 11 23 18  <5*  CREATININE 4.25* 3.04* 4.87* 3.96* 2.28*  CALCIUM 8.5* 8.4* 8.6* 7.7* 8.1*  PHOS 3.4  --  5.0* 2.3*  --      Liver Function Tests: Recent Labs  Lab 07/06/19 0757 07/08/19 1902 07/11/19 1450  ALBUMIN 2.0* 2.1* 2.0*    DVT prophylaxis: SCDs  Code Status: Full code  Family Communication: No family at bedside    Status is: Inpatient  Dispo: The patient is from: Home              Anticipated d/c is to: Skilled nursing facility              Anticipated d/c date is: 07/13/2019              Patient is medically not stable for discharge  Barrier to discharge-awaiting bed at skilled nursing facility, right lower extremity arteriogram today     Scheduled medications:  . [START ON 07/13/2019] allopurinol  100 mg Oral Daily  . amiodarone  200 mg Oral BID  . aspirin EC  81 mg Oral Daily  . [START ON 07/13/2019] calcitRIOL  0.5 mcg Oral Q M,W,F-HD  . Chlorhexidine Gluconate Cloth  6 each Topical Q0600  . [START ON 07/13/2019] cholecalciferol  2,000 Units Oral Daily  . clopidogrel  75 mg Oral Q breakfast  . darbepoetin (ARANESP) injection - DIALYSIS  150 mcg Intravenous Q Sat-HD  . dicyclomine  10 mg Oral QHS  . docusate sodium  100 mg Oral BID  . feeding supplement (NEPRO CARB STEADY)  237 mL Oral BID BM  . ferric citrate  210 mg Oral TID WC  . isosorbide mononitrate  30 mg Oral QHS  . metoprolol tartrate  50 mg Oral BID  . mometasone-formoterol  2 puff Inhalation BID  . pantoprazole  40 mg Oral Q12H  . pravastatin  80 mg Oral QPM  . sodium chloride flush  3 mL Intravenous Q12H  . sucralfate  1 g Oral Q6H    Consultants:  Gastroenterology  Vascular surgery  Procedures:  Endoscopy 07/05/2019  Antibiotics:   Anti-infectives (From admission, onward)   None        Objective   Vitals:   07/12/19 0336 07/12/19 0415 07/12/19 0808 07/12/19 0816  BP: (!) 162/54 138/66 (!) 159/53   Pulse: 63 65 (!) 59 97  Resp: 13 18  16   Temp: 98.5 F (36.9 C)     TempSrc: Oral     SpO2: 100% 100%  98%  Weight:      Height:        Intake/Output Summary (Last 24 hours) at 07/12/2019 1057 Last data filed at 07/12/2019 0819 Gross per 24 hour  Intake 123 ml  Output 2000 ml  Net -1877 ml    06/13 1901 - 06/15 0700 In: 363 [P.O.:360; I.V.:3] Out: 2003 [Urine:2]  Filed Weights   07/11/19 0610 07/11/19 1835 07/12/19 0040  Weight: 40.3 kg 40.4 kg 39.4 kg    Physical Examination:   General-appears in no acute distress Heart-S1-S2, regular, no murmur auscultated Lungs-clear  to auscultation bilaterally, no wheezing or crackles auscultated Abdomen-soft, nontender, no organomegaly Extremities-no edema in the lower extremities Neuro-alert, oriented x3, no focal deficit noted   Data Reviewed:   Cardiac Enzymes: No results for input(s): CKTOTAL, CKMB, CKMBINDEX, TROPONINI in the last 168 hours. BNP (last 3 results) Recent Labs    01/24/19 2228 02/04/19 0103 03/25/19 1313  BNP 1,570.0* 4,197.2* 2,007.2*    [place]   [facility] If 7PM-7AM, please contact night-coverage at [email or web address], Office  [phone]   07/12/2019, 10:57 AM  LOS: 12 days

## 2019-07-12 NOTE — Progress Notes (Addendum)
[facility] Progress Note   Subjective:   Arteriogram planned for today. No new concerns. Tolerated 15g needles well yesterday and would like to get [facility] out while she is here. Denies SOB, cough, CP, palpitations, dizziness, abdominal pain, N/V/D.   Objective Vitals:   07/12/19 0336 07/12/19 0415 07/12/19 0808 07/12/19 0816  BP: (!) 162/54 138/66 (!) 159/53   Pulse: 63 65 (!) 59 97  Resp: 13 18  16   Temp: 98.5 F (36.9 C)     TempSrc: Oral     SpO2: 100% 100%  98%  Weight:      Height:       Physical Exam General: Well developed female, alert and in NAD Heart: RRR, no murmurs Lungs: CTA b/l without wheezing/rhonchi/rales. Respirations unlabored Abdomen: Soft, non-tender, non-distended, +BS Extremities: No edema b/l lower extremities Dialysis Access:  LUE AVF + thrill, R IJ [facility]  Additional Objective Labs: Basic Metabolic Panel: Recent Labs  Lab 07/06/19 0757 07/07/19 0434 07/08/19 1902 07/11/19 1450 07/12/19 0234  NA 136   < > 137 137 141  K 4.0   < > 3.8 3.5 3.9  CL 102   < > 103 101 104  CO2 24   < > 23 25 28   GLUCOSE 88   < > 93 143* 96  BUN 27*   < > 23 18 <5*  CREATININE 4.25*   < > 4.87* 3.96* 2.28*  CALCIUM 8.5*   < > 8.6* 7.7* 8.1*  PHOS 3.4  --  5.0* 2.3*  --    < > = values in this interval not displayed.   Liver Function Tests: Recent Labs  Lab 07/06/19 0757 07/08/19 1902 07/11/19 1450  ALBUMIN 2.0* 2.1* 2.0*   CBC: Recent Labs  Lab 07/08/19 0500 07/08/19 0500 07/08/19 1902 07/08/19 1902 07/10/19 0411 07/11/19 1450 07/12/19 0735  WBC 5.7   < > 7.5   < > 9.1 5.0 6.6  HGB 10.4*   < > 10.5*   < > 9.8* 8.3* 10.0*  HCT 32.8*   < > 34.1*   < > 31.8* 27.3* 33.4*  MCV 95.3  --  96.3  --  94.4 96.1 94.9  PLT 275   < > 279   < > 267 235 259   < > = values in this interval not displayed.   Blood Culture    Component Value Date/Time   SDES BLOOD RIGHT HAND 06/17/2019 1223   [place]  06/17/2019 1223    BOTTLES DRAWN AEROBIC  ONLY Blood Culture results may not be optimal due to an inadequate volume of blood received in culture bottles   CULT  06/17/2019 1223    NO GROWTH 5 DAYS Performed at [facility], [place] [address], [place], [place] [address]    REPTSTATUS 06/22/2019 FINAL 06/17/2019 1223    Medications: . sodium chloride    . sodium chloride 10 mL/hr at 07/12/19 0829  . ferric gluconate (FERRLECIT/NULECIT) IV 125 mg (07/11/19 2050)   . [START ON 07/13/2019] allopurinol  100 mg Oral Daily  . amiodarone  200 mg Oral BID  . aspirin EC  81 mg Oral Daily  . [START ON 07/13/2019] calcitRIOL  0.5 mcg Oral Q M,W,F-HD  . Chlorhexidine Gluconate Cloth  6 each Topical Q0600  . [START ON 07/13/2019] cholecalciferol  2,000 Units Oral Daily  . clopidogrel  75 mg Oral Q breakfast  . darbepoetin (ARANESP) injection - DIALYSIS  150 mcg Intravenous Q Sat-HD  .  dicyclomine  10 mg Oral QHS  . docusate sodium  100 mg Oral BID  . feeding supplement (NEPRO CARB STEADY)  237 mL Oral BID BM  . ferric citrate  630 mg Oral TID WC  . isosorbide mononitrate  30 mg Oral QHS  . metoprolol tartrate  50 mg Oral BID  . mometasone-formoterol  2 puff Inhalation BID  . pantoprazole  40 mg Oral Q12H  . pravastatin  80 mg Oral QPM  . sodium chloride flush  3 mL Intravenous Q12H  . sucralfate  1 g Oral Q6H    Dialysis Orders: [facility] TTS 4 hr EDW 48 kg F180 BFR 350 mL/ min DFR A1.5 UF profile 2no heparin Venofer 100 mg q rx x 9, started 5/27 Mircera 200 q 2 weeks, last given 06/02/19 Calcitriol 1.0 cmg q rx  TDC [facility] 03/2018 at [facility]  Assessment/Plan: 1.Recurrent GIB - melanoic stools on HD with abdominal pain -s/p 4 units PRBC GI following- presumed bleeding from previously diagnosed severe duodenal ulcerations,recent acute GIB in May with multiple deep duodenal ulcers -On PPI bid.Repeat EGD 6/8 showed multiple plagues in the mid and distal esophagus, smal HH, two non-bleeding duodenal ulcers. No heparin.  2.  ESRD- TTS schedule, changed to MWF to accommodate SNF, had HD 6/14. next HD 6/16.K+ 3.8.AVFworking well, tolerated 15g needles yesterday, will consult IR for [facility] removal if AVF works well tomorrow.  3. Anemia-Acute on chronic 2/2 #1.hgbstabilizedwith slight post op drop.Aranesp150mg  q Saturday,s/p course of Fe. 4. Secondary hyperparathyroidism- Corrected calcium 9.7. Calcitriol dose has been decreased for hypercalcemia, now resolved. Phos 2.3- decrease auryxia from 3 tabs TID to 1 tab TID.  5.HTN/volumeVolume overloaded on admitbut now much improved. Weights variable here, will follow post HD. Needs a new EDW at discharge.Continuehome dilt 120 daily , cont metoprolol. Cardizem d/c due to bradycardia.  6. Nutrition-On renal diet, protein supplements and vitamin. 7. Mesenteric ischemia s/p SMA stent 05/2019 8.PAD -ischemic left 4th and 5th toes, s/p arteriogram 6/11 with prolonged bleeding, now resolved. Per vascular 9. Atrial fibrillation -- on amiodarone/metoprolol 10. Hx cocaine abuse - UDS negative  11. 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 DNR 13. Disp -tentative SNF   Anice Paganini, PA-C 07/12/2019, 9:55 AM  [facility] Pager: [phone]

## 2019-07-13 ENCOUNTER — Encounter (HOSPITAL_COMMUNITY): Payer: Self-pay | Admitting: Surgery

## 2019-07-13 LAB — POCT ACTIVATED CLOTTING TIME: Activated Clotting Time: 208 seconds

## 2019-07-13 LAB — CBC
HCT: 31.9 % — ABNORMAL LOW (ref 36.0–46.0)
Hemoglobin: 9.5 g/dL — ABNORMAL LOW (ref 12.0–15.0)
MCH: 28.8 pg (ref 26.0–34.0)
MCHC: 29.8 g/dL — ABNORMAL LOW (ref 30.0–36.0)
MCV: 96.7 fL (ref 80.0–100.0)
Platelets: 246 10*3/uL (ref 150–400)
RBC: 3.3 MIL/uL — ABNORMAL LOW (ref 3.87–5.11)
RDW: 16.4 % — ABNORMAL HIGH (ref 11.5–15.5)
WBC: 10 10*3/uL (ref 4.0–10.5)
nRBC: 0.2 % (ref 0.0–0.2)

## 2019-07-13 LAB — RENAL FUNCTION PANEL
Albumin: 2.1 g/dL — ABNORMAL LOW (ref 3.5–5.0)
Anion gap: 9 (ref 5–15)
BUN: 18 mg/dL (ref 8–23)
CO2: 24 mmol/L (ref 22–32)
Calcium: 8.4 mg/dL — ABNORMAL LOW (ref 8.9–10.3)
Chloride: 103 mmol/L (ref 98–111)
Creatinine, Ser: 4.01 mg/dL — ABNORMAL HIGH (ref 0.44–1.00)
GFR calc Af Amer: 12 mL/min — ABNORMAL LOW (ref >60)
GFR calc non Af Amer: 11 mL/min — ABNORMAL LOW (ref >60)
Glucose, Bld: 200 mg/dL — ABNORMAL HIGH (ref 70–99)
Phosphorus: 3.4 mg/dL (ref 2.5–4.6)
Potassium: 4.1 mmol/L (ref 3.5–5.1)
Sodium: 136 mmol/L (ref 135–145)

## 2019-07-13 LAB — SARS CORONAVIRUS 2 (TAT 6-24 HRS): SARS Coronavirus 2: NEGATIVE

## 2019-07-13 MED ORDER — OXYCODONE-ACETAMINOPHEN 5-325 MG PO TABS
1.0000 | ORAL_TABLET | Freq: Once | ORAL | Status: AC
  Administered 2019-07-13: 1 via ORAL

## 2019-07-13 MED ORDER — OXYCODONE-ACETAMINOPHEN 5-325 MG PO TABS
ORAL_TABLET | ORAL | Status: AC
  Filled 2019-07-13: qty 1

## 2019-07-13 MED ORDER — CALCITRIOL 0.5 MCG PO CAPS
ORAL_CAPSULE | ORAL | Status: AC
  Filled 2019-07-13: qty 1

## 2019-07-13 NOTE — Progress Notes (Signed)
Pt received from HD. VSS. Call bell in reach. Will continue to monitor.  Kem Hensen R Larayne Baxley, RN  

## 2019-07-13 NOTE — Progress Notes (Signed)
Progress Note    07/13/2019 8:31 AM 1 Day Post-Op  Subjective: Asked by nephrology to evaluate her fistula as she is complaining of pain.  Patient seen and evaluated in the inpatient hemodialysis department.  When asked about her arm, she states she is "hurting everywhere,my joints, where they cut me".  Says she has not eaten because she was not served a meal since her procedure.  No nausea, vomiting.  Denies left hand pain.  Passing flatus   Vitals:   07/13/19 0730 07/13/19 0800  BP: (!) 160/67 (!) 168/66  Pulse: (!) 56 60  Resp:    Temp:    SpO2:      Physical Exam: Cardiac:  RRR Lungs:  nonlabored Extremities: Left upper arm AV fistula is accessed without apparent complications.  2+ radial pulse.  5 out of 5 grip strength.  Right groin soft.  Left groin with moderate ecchymosis, diffusely firm without pulsatility. Sensation and motor function intact of both feet. Left 4th toe now darkened Abdomen: Nondistended, not tender to palpation, normoactive bowel sounds  CBC    Component Value Date/Time   WBC 10.0 07/13/2019 0350   RBC 3.30 (L) 07/13/2019 0350   HGB 9.5 (L) 07/13/2019 0350   HCT 31.9 (L) 07/13/2019 0350   PLT 246 07/13/2019 0350   MCV 96.7 07/13/2019 0350   MCH 28.8 07/13/2019 0350   MCHC 29.8 (L) 07/13/2019 0350   RDW 16.4 (H) 07/13/2019 0350   LYMPHSABS 0.9 06/29/2019 1610   MONOABS 0.5 06/29/2019 1610   EOSABS 0.1 06/29/2019 1610   BASOSABS 0.1 06/29/2019 1610    BMET    Component Value Date/Time   NA 136 07/13/2019 0350   K 4.1 07/13/2019 0350   CL 103 07/13/2019 0350   CO2 24 07/13/2019 0350   GLUCOSE 200 (H) 07/13/2019 0350   BUN 18 07/13/2019 0350   CREATININE 4.01 (H) 07/13/2019 0350   CALCIUM 8.4 (L) 07/13/2019 0350   GFRNONAA 11 (L) 07/13/2019 0350   GFRAA 12 (L) 07/13/2019 0350    No intake or output data in the 24 hours ending 07/13/19 [place] Scheduled Meds: . allopurinol  100 mg Oral Daily  . amiodarone  200  mg Oral BID  . aspirin EC  81 mg Oral Daily  . calcitRIOL  0.5 mcg Oral Q M,W,F-HD  . Chlorhexidine Gluconate Cloth  6 each Topical Q0600  . cholecalciferol  2,000 Units Oral Daily  . clopidogrel  75 mg Oral Q breakfast  . darbepoetin (ARANESP) injection - DIALYSIS  150 mcg Intravenous Q Sat-HD  . dicyclomine  10 mg Oral QHS  . docusate sodium  100 mg Oral BID  . feeding supplement (NEPRO CARB STEADY)  237 mL Oral BID BM  . ferric citrate  210 mg Oral TID WC  . isosorbide mononitrate  30 mg Oral QHS  . metoprolol tartrate  50 mg Oral BID  . mometasone-formoterol  2 puff Inhalation BID  . pantoprazole  40 mg Oral Q12H  . pravastatin  80 mg Oral QPM  . sodium chloride flush  3 mL Intravenous Q12H  . sucralfate  1 g Oral Q6H   Continuous Infusions: . sodium chloride 10 mL/hr at 07/12/19 0829  . sodium chloride     PRN Meds:.sodium chloride, acetaminophen, clobetasol cream, diphenhydrAMINE, haloperidol **OR** haloperidol lactate, hydrALAZINE, hydrALAZINE, hydrALAZINE, ipratropium-albuterol, labetalol, nitroGLYCERIN, ondansetron (ZOFRAN) IV, ondansetron, oxyCODONE-acetaminophen, polyethylene glycol, senna-docusate, sodium chloride flush  Assessment: Status post right and left lower extremity arteriogram  with intervention.   See, left CIA, EIA stents.  Continues on Plavix and aspirin.  Globin 9.5 today down from 10 yesterday.  Unable to assess Doppler pulses due to dysfunction of Doppler unit.  Plan: -Will reassess once she is back on floor and will further assess her fistula and check groin and Doppler pulses   Risa Grill, PA-C [facility] [phone] 07/13/2019  8:31 AM

## 2019-07-13 NOTE — TOC Progression Note (Signed)
Transition of Care [facility]) - Progression Note    Patient Details  Name: Christina [name] MRN: [id number] Date of Birth: July 22, 1950  Transition of Care [facility]) CM/SW [place], [place] Phone Number: 07/13/2019, 10:37 AM  Clinical Narrative:     Received insurance authorization for SNF # [id number], refer # [id number].   Covid is pending, collected on 6/14 - CSW informed RN may need to request rapid test today. Informed [facility]- waiting on covid test.  Thurmond Butts, MSW, [place] Clinical Social Worker   Expected Discharge Plan: Skilled Nursing Facility Barriers to Discharge: Continued Medical Work up, Ship broker  Expected Discharge Plan and Services Expected Discharge Plan: [place] In-house Referral: Clinical Social Work   Post Acute Care Choice: [place] Living arrangements for the past 2 months: Apartment                                       Social Determinants of Health (SDOH) Interventions    Readmission Risk Interventions Readmission Risk Prevention Plan 07/05/2019 06/17/2019 03/29/2019  Transportation Screening Complete Complete Complete  Medication Review Press photographer) Complete Complete Complete  PCP or Specialist appointment within 3-5 days of discharge Complete - -  [place] or Home Care Consult Complete Complete Complete  SW Recovery Care/Counseling Consult Complete Complete Complete  Palliative Care Screening Not Applicable Not Applicable Not Applicable  Skilled Nursing Facility Complete (No Data) Not Applicable  Some recent data might be hidden

## 2019-07-13 NOTE — Progress Notes (Signed)
Physical Therapy Treatment Patient Details Name: Christina [name] MRN: [id number] DOB: [birth year] Today's Date: 07/13/2019    History of Present Illness Pt is a 69 y/o female admitted secondary to rectoal prolapse and anemia. PMH includes HTN, depression, CAD s/p stent, COPD, ESRD on HD TTS, and a fib. Pt also with significant Bil foot pain to be assessed via arterogram by vascular surgery this week.      PT Comments    Pt was seen for mobility on RW, with care to keep warm and with monitoring of vitals on telemetry. Pt is getting up to walk with pain on L side abd and leg, mainly from her procedure.  Pt is more stable to walk, and will anticipate her ability to walk with more independence being realistic as time continues on.  Follow acutely to work on standing balance and endurance with longer walks as tolerated.  Follow Up Recommendations  SNF;Supervision/Assistance - 24 hour     Equipment Recommendations  Rolling walker with 5" wheels;Wheelchair (measurements PT);Wheelchair cushion (measurements PT)    Recommendations for Other Services       Precautions / Restrictions Precautions Precautions: Fall Precaution Comments: limited activity tolerance Restrictions Weight Bearing Restrictions: No    Mobility  Bed Mobility Overal bed mobility: Needs Assistance Bed Mobility: Supine to Sit;Sit to Supine Rolling: Supervision   Supine to sit: Supervision Sit to supine: Supervision      Transfers Overall transfer level: Needs assistance Equipment used: Rolling walker (2 wheeled) Transfers: Sit to/from Stand Sit to Stand: Min guard Stand pivot transfers: Min guard       General transfer comment: min guard to steady with RW being primary support to move  Ambulation/Gait Ambulation/Gait assistance: Min guard Gait Distance (Feet): 50 Feet Assistive device: Rolling walker (2 wheeled) Gait Pattern/deviations: Step-through pattern;Decreased stride length;Wide base of  support Gait velocity: reuced Gait velocity interpretation: <1.31 ft/sec, indicative of household ambulator General Gait Details: Limited wb on walker on LLE   Stairs             Wheelchair Mobility    Modified Rankin (Stroke Patients Only)       Balance Overall balance assessment: Needs assistance Sitting-balance support: Feet supported Sitting balance-Leahy Scale: Good     Standing balance support: Bilateral upper extremity supported;During functional activity Standing balance-Leahy Scale: Fair Standing balance comment: less than fair dynamically                            Cognition Arousal/Alertness: Awake/alert Behavior During Therapy: WFL for tasks assessed/performed Overall Cognitive Status: Within Functional Limits for tasks assessed                                        Exercises General Exercises - Lower Extremity Ankle Circles/Pumps: AROM;5 reps Quad Sets: AROM;10 reps Heel Slides: AAROM;10 reps;Right;5 reps;Left Hip ABduction/ADduction: AROM;10 reps    General Comments General comments (skin integrity, edema, etc.): Pt was up with telemetry to walk, maintained O2 sats and pulses were 117 at highest point to walk      Pertinent Vitals/Pain Pain Assessment: Faces Faces Pain Scale: Hurts little more Pain Location: Feet and legs esp LLE Pain Descriptors / Indicators: Guarding;Grimacing Pain Intervention(s): Limited activity within patient's tolerance;Monitored during session;Premedicated before session;Repositioned    Home Living  Prior Function            PT Goals (current goals can now be found in the care plan section) Acute Rehab PT Goals Patient Stated Goal: to go to rehab Progress towards PT goals: Progressing toward goals    Frequency    Min 2X/week      PT Plan Current plan remains appropriate    Co-evaluation              AM-PAC PT "6 Clicks" Mobility    Outcome Measure  Help needed turning from your back to your side while in a flat bed without using bedrails?: None Help needed moving from lying on your back to sitting on the side of a flat bed without using bedrails?: None Help needed moving to and from a bed to a chair (including a wheelchair)?: A Little Help needed standing up from a chair using your arms (e.g., wheelchair or bedside chair)?: A Little Help needed to walk in hospital room?: A Little Help needed climbing 3-5 steps with a railing? : A Lot 6 Click Score: 19    End of Session Equipment Utilized During Treatment: Gait belt Activity Tolerance: Patient tolerated treatment well Patient left: in bed;with call bell/phone within reach;with bed alarm set Nurse Communication: Mobility status PT Visit Diagnosis: Unsteadiness on feet (R26.81);Muscle weakness (generalized) (M62.81);Difficulty in walking, not elsewhere classified (R26.2) Pain - Right/Left: Left Pain - part of body: Leg;Ankle and joints of foot (abdomen)     Time: 5277-8242 PT Time Calculation (min) (ACUTE ONLY): 23 min  Charges:  $Gait Training: 8-22 mins $Therapeutic Exercise: 8-22 mins        Ramond Dial 07/13/2019, 6:13 PM  Mee Hives, PT MS Acute Rehab Dept. Number: [place]

## 2019-07-13 NOTE — Progress Notes (Signed)
PROGRESS NOTE  Christina [name]  DOB: 1951-01-16  PCP: Tawni Pummel [place], PA-C ZOX:[id number]  DOA: 06/29/2019  LOS: 13 days   Chief Complaint  Patient presents with  . Rectal Problems   Brief narrative: 69 year old female with PMH significant of hypertension, hyperlipidemia, CAD, atrial fibrillation, COPD, ESRD on HD TTS, SBO, rectal prolapse, polysubstance abuse including cocaine. This is her 10th hospitalization in the last 6 months.   Patient presented to ED on 6/2 with complaints of rectal pain aggressively worsening over the past 4 days.  Patient missed her hemodialysis due to pain.  Patient was found to be hypotensive on admission with BP 76/52, which responded to PRBC.  But she was continued to have melena during hospital stay.  She was seen by GI, underwent endoscopy and arteriogram with stent placement as per vascular surgery.   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 Patient also has chronic A. fib but is no longer on anticoagulation because of duodenal ulcer.  5/8-5/9, hospitalized for peripheral arterial disease and gangrene of left foot for which she was discharged to follow with Dr. Oneida Alar. Dr. Oneida Alar has been contemplating amputating her toes.  Subjective: Patient was seen and examined this morning in dialysis. Patient was in tears complaining of pain everywhere. Given a dose of Percocet 5/325 mg.  Assessment/Plan: GI bleed/acute blood loss anemia Acute blood loss anemia -endoscopy showed duodenal ulcer with esophageal plaques. Biopsy was negative for malignancy, and consistent with esophagitis.   -Patient was transfused 4 units PRBC.  Hemoglobin is stable at 10.0  -aspirin and Plavix has been restarted.   -Continue Protonix 40 mg p.o. twice daily.  Peripheral vascular disease with ischemic changes without  gangrene -s/p stent placement in left common iliac artery, left external iliac artery. -vascular surgery following.   -Continue aspirin and Plavix.   -6/15, underwent right lower extremity arteriogram with balloon angioplasty.  Delirium -resolved, mental status likely at baseline.  Patient had delirium during this hospitalization for which she required multiple doses of IM Haldol.  We will continue to monitor.  ESRD on hemodialysis-nephrology following.  MWF dialysis.  Hypertension -blood pressure stable on metoprolol 50 mg twice daily.  Paroxysmal atrial fibrillation -Continue amiodarone and metoprolol.  Cardizem has been stopped because of bradycardia. -she is not on anticoagulation due to history of GI bleed.  Hyperlipidemia-continue pravastatin.  COPD without acute exacerbation -continue Dulera, as needed DuoNebs.  Rectal prolapse-acute on chronic, lidocaine jelly as needed.  Malnutrition -nutrition consult Body mass index is 16.53 kg/m.   Mobility: Encourage ambulation.  SNF recommended PT Code Status:  DNR  DVT prophylaxis:SCD's Start: 07/08/19 1712 SCDs Start: 06/30/19 1151  Antimicrobials:  None Fluid: None Diet: Renal diet  Consultants: Nephrology Family Communication:  None at bedside  Status is: Inpatient  Remains inpatient appropriate because:Persistent severe electrolyte disturbances and IV treatments appropriate due to intensity of illness or inability to take PO   Dispo: The patient is from: Home              Anticipated d/c is to: SNF              Anticipated d/c date is: 2 days              Patient currently is not medically stable to d/c.        Antimicrobials: Anti-infectives (From admission, onward)   None  Code Status: DNR   Diet Order            Diet renal/carb modified with fluid restriction Diet-HS Snack? Nothing; Fluid restriction: 1200 mL Fluid; Room service appropriate? Yes with Assist; Fluid consistency: Thin  Diet  effective now                 Infusions:  . sodium chloride 10 mL/hr at 07/12/19 0829  . sodium chloride      Scheduled Meds: . allopurinol  100 mg Oral Daily  . amiodarone  200 mg Oral BID  . aspirin EC  81 mg Oral Daily  . calcitRIOL  0.5 mcg Oral Q M,W,F-HD  . Chlorhexidine Gluconate Cloth  6 each Topical Q0600  . cholecalciferol  2,000 Units Oral Daily  . clopidogrel  75 mg Oral Q breakfast  . darbepoetin (ARANESP) injection - DIALYSIS  150 mcg Intravenous Q Sat-HD  . dicyclomine  10 mg Oral QHS  . docusate sodium  100 mg Oral BID  . feeding supplement (NEPRO CARB STEADY)  237 mL Oral BID BM  . ferric citrate  210 mg Oral TID WC  . isosorbide mononitrate  30 mg Oral QHS  . metoprolol tartrate  50 mg Oral BID  . mometasone-formoterol  2 puff Inhalation BID  . pantoprazole  40 mg Oral Q12H  . pravastatin  80 mg Oral QPM  . sodium chloride flush  3 mL Intravenous Q12H  . sucralfate  1 g Oral Q6H    PRN meds: sodium chloride, acetaminophen, clobetasol cream, diphenhydrAMINE, haloperidol **OR** haloperidol lactate, hydrALAZINE, hydrALAZINE, hydrALAZINE, ipratropium-albuterol, labetalol, nitroGLYCERIN, ondansetron (ZOFRAN) IV, ondansetron, oxyCODONE-acetaminophen, polyethylene glycol, senna-docusate, sodium chloride flush   Objective: Vitals:   07/13/19 1205 07/13/19 1608  BP: 112/73 114/79  Pulse: 100 61  Resp: (!) 25 15  Temp: 98.8 F (37.1 C) 98.5 F (36.9 C)  SpO2: 100% 100%    Intake/Output Summary (Last 24 hours) at 07/13/2019 1755 Last data filed at 07/13/2019 1122 Gross per 24 hour  Intake --  Output 1950 ml  Net -1950 ml   Filed Weights   07/12/19 0040 07/13/19 0540 07/13/19 0703  Weight: 39.4 kg 41.4 kg 41 kg   Weight change: 1 kg Body mass index is 16.53 kg/m.   Physical Exam: General exam: Appears calm and comfortable.  Crying with pain.  Cannot specify what can the pain Skin: No rashes, lesions or ulcers. HEENT: Atraumatic, normocephalic,  supple neck, no obvious bleeding Lungs: Clear to auscultate bilaterally CVS: No murmur, heart rate controlled GI/Abd soft, nontender, nondistended, bowel sound present CNS: Alert and awake monitor x3 Psychiatry: Tearful Extremities: No pedal edema, no calf tenderness  Data Review: I have personally reviewed the laboratory data and studies available.  Recent Labs  Lab 07/08/19 1902 07/10/19 0411 07/11/19 1450 07/12/19 0735 07/13/19 0350  WBC 7.5 9.1 5.0 6.6 10.0  HGB 10.5* 9.8* 8.3* 10.0* 9.5*  HCT 34.1* 31.8* 27.3* 33.4* 31.9*  MCV 96.3 94.4 96.1 94.9 96.7  PLT 279 267 235 259 246   Recent Labs  Lab 07/07/19 0434 07/08/19 1902 07/11/19 1450 07/12/19 0234 07/13/19 0350  NA 137 137 137 141 136  K 3.5 3.8 3.5 3.9 4.1  CL 101 103 101 104 103  CO2 25 23 25 28 24   GLUCOSE 91 93 143* 96 200*  BUN 11 23 18  <5* 18  CREATININE 3.04* 4.87* 3.96* 2.28* 4.01*  CALCIUM 8.4* 8.6* 7.7* 8.1* 8.4*  PHOS  --  5.0* 2.3*  --  3.4    Signed, Terrilee Croak, MD [facility] Pager: [phone] (Secure Chat preferred). 07/13/2019

## 2019-07-13 NOTE — Progress Notes (Signed)
Patient crying on HD and called Navigator over. She asked for a hair tie and Navigator put her hair up for her, of which she appreciated. She continued to have tears in her eyes and spoke about how poorly she feels and all she has been through lately. She feels like the medical issues are never-ending. The more she spoke, the more she cried. She seems very defeated. Support offered. Patient stated she just wanted to return to her room Navigator provided calming presence, and encouraged her to try to relax and finish her treatment. Patient calm when Navigator left her bedside. Navigator available for support as needed/desired while patient is inpatient.  Alphonzo Cruise, [place] [facility] Navigator [phone]

## 2019-07-13 NOTE — TOC Progression Note (Signed)
Transition of Care [facility]) - Progression Note    Patient Details  Name: DEVONIA FARRO MRN: [id number] Date of Birth: [birth year]  Transition of Care [facility]) CM/SW [place], [place] Phone Number: 07/13/2019, 3:26 PM  Clinical Narrative:     covid still pending   Thurmond Butts, MSW, [place] Clinical Social Worker   Expected Discharge Plan: Skilled Nursing Facility Barriers to Discharge: Continued Medical Work up, Ship broker  Expected Discharge Plan and Services Expected Discharge Plan: [place] In-house Referral: Clinical Social Work   Post Acute Care Choice: [place] Living arrangements for the past 2 months: Apartment                                       Social Determinants of Health (SDOH) Interventions    Readmission Risk Interventions Readmission Risk Prevention Plan 07/05/2019 06/17/2019 03/29/2019  Transportation Screening Complete Complete Complete  Medication Review Press photographer) Complete Complete Complete  PCP or Specialist appointment within 3-5 days of discharge Complete - -  [place] or Home Care Consult Complete Complete Complete  SW Recovery Care/Counseling Consult Complete Complete Complete  Palliative Care Screening Not Applicable Not Applicable Not Applicable  Skilled Nursing Facility Complete (No Data) Not Applicable  Some recent data might be hidden

## 2019-07-13 NOTE — Progress Notes (Addendum)
Progress Note    07/13/2019 3:04 PM 1 Day Post-Op  Subjective:  Feels better. Has eaten without N or V. No left arm pain. Finished HD via left AVF without complications.   Vitals:   07/13/19 1122 07/13/19 1205  BP: (!) 125/58 112/73  Pulse: 74 100  Resp: (!) 26 (!) 25  Temp: 97.7 F (36.5 C) 98.8 F (37.1 C)  SpO2: 100% 100%    Physical Exam: Cardiac:  RRR Lungs:  nonlabored Extremities:  Brisk DP, PT and peroneal Doppler signals bilaterally.  Left groin ecchymosis and mild swelling unchanged as compared to this morning. Left upper arm AVF inspected.  She has several ecchymotic areas without skin disruption.  Abdomen:  Mild TTP in LLQ  CBC    Component Value Date/Time   WBC 10.0 07/13/2019 0350   RBC 3.30 (L) 07/13/2019 0350   HGB 9.5 (L) 07/13/2019 0350   HCT 31.9 (L) 07/13/2019 0350   PLT 246 07/13/2019 0350   MCV 96.7 07/13/2019 0350   MCH 28.8 07/13/2019 0350   MCHC 29.8 (L) 07/13/2019 0350   RDW 16.4 (H) 07/13/2019 0350   LYMPHSABS 0.9 06/29/2019 1610   MONOABS 0.5 06/29/2019 1610   EOSABS 0.1 06/29/2019 1610   BASOSABS 0.1 06/29/2019 1610    BMET    Component Value Date/Time   NA 136 07/13/2019 0350   K 4.1 07/13/2019 0350   CL 103 07/13/2019 0350   CO2 24 07/13/2019 0350   GLUCOSE 200 (H) 07/13/2019 0350   BUN 18 07/13/2019 0350   CREATININE 4.01 (H) 07/13/2019 0350   CALCIUM 8.4 (L) 07/13/2019 0350   GFRNONAA 11 (L) 07/13/2019 0350   GFRAA 12 (L) 07/13/2019 0350     Intake/Output Summary (Last 24 hours) at 07/13/2019 1504 Last data filed at 07/13/2019 1122 Gross per 24 hour  Intake --  Output 1950 ml  Net -1950 ml    HOSPITAL MEDICATIONS Scheduled Meds: . allopurinol  100 mg Oral Daily  . amiodarone  200 mg Oral BID  . aspirin EC  81 mg Oral Daily  . calcitRIOL  0.5 mcg Oral Q M,W,F-HD  . Chlorhexidine Gluconate Cloth  6 each Topical Q0600  . cholecalciferol  2,000 Units Oral Daily  . clopidogrel  75 mg Oral Q breakfast  .  darbepoetin (ARANESP) injection - DIALYSIS  150 mcg Intravenous Q Sat-HD  . dicyclomine  10 mg Oral QHS  . docusate sodium  100 mg Oral BID  . feeding supplement (NEPRO CARB STEADY)  237 mL Oral BID BM  . ferric citrate  210 mg Oral TID WC  . isosorbide mononitrate  30 mg Oral QHS  . metoprolol tartrate  50 mg Oral BID  . mometasone-formoterol  2 puff Inhalation BID  . pantoprazole  40 mg Oral Q12H  . pravastatin  80 mg Oral QPM  . sodium chloride flush  3 mL Intravenous Q12H  . sucralfate  1 g Oral Q6H   Continuous Infusions: . sodium chloride 10 mL/hr at 07/12/19 0829  . sodium chloride     PRN Meds:.sodium chloride, acetaminophen, clobetasol cream, diphenhydrAMINE, haloperidol **OR** haloperidol lactate, hydrALAZINE, hydrALAZINE, hydrALAZINE, ipratropium-albuterol, labetalol, nitroGLYCERIN, ondansetron (ZOFRAN) IV, ondansetron, oxyCODONE-acetaminophen, polyethylene glycol, senna-docusate, sodium chloride flush  Assessment: Status post right and left lower extremity arteriogram with intervention. Left CIA, EIA stents. Right SFA intra-arterial lithotripsy and angioplasty. Small, non-expanding left groin hematoma.   Previous SMA stent.   Plan: -Follow-up for SMA stent previously arranged.  She will follow-up lower  extremity intervention in 4-6 weeks.  I explained to her that she needs to watch for drainage, skin breakdown of toes/feet and to notify us immediately if this occurs.  Continue Plavix, asa, statin.  Risa Grill, PA-C [facility] [phone] 07/13/2019  3:04 PM

## 2019-07-13 NOTE — Progress Notes (Addendum)
[facility] Progress Note   Subjective:   Patient seen on HD. Reports leg pain and pain in AVF today, reports it started yesterday after her vascular procedure. Patient reports she hurts "all over." AVF working well on HD with no clear cause of pain. Vascular PA kindly evaluating AVF as well. Denies SOB, dizziness, CP, N/V.   Objective Vitals:   07/13/19 0900 07/13/19 0930 07/13/19 1000 07/13/19 1030  BP: 133/60 127/63 123/66 (!) 148/81  Pulse: 68 70 73 82  Resp:      Temp:      TempSrc:      SpO2:      Weight:      Height:       Physical Exam General: Chronically ill female, tearful Heart: RRR, no murmurs, rubs or gallops Lungs: CTA bilaterally without wheezing, rhonchi or rales Abdomen: Soft, non-tender, non-distended, +BS Extremities: No edema b/l LE Dialysis Access: LUE AVF currently accessed, no edema, erythema or bruising noted, hand warm with 2+ radial pulse  Additional Objective Labs: Basic Metabolic Panel: Recent Labs  Lab 07/08/19 1902 07/08/19 1902 07/11/19 1450 07/12/19 0234 07/13/19 0350  NA 137   < > 137 141 136  K 3.8   < > 3.5 3.9 4.1  CL 103   < > 101 104 103  CO2 23   < > _0 GLUCOSE 93   < > 143* 96 200*  BUN 23   < > 18 <5* 18  CREATININE 4.87*   < > 3.96* 2.28* 4.01*  CALCIUM 8.6*   < > 7.7* 8.1* 8.4*  PHOS 5.0*  --  2.3*  --  3.4   < > = values in this interval not displayed.   Liver Function Tests: Recent Labs  Lab 07/08/19 1902 07/11/19 1450 07/13/19 0350  ALBUMIN 2.1* 2.0* 2.1*   CBC: Recent Labs  Lab 07/08/19 1902 07/08/19 1902 07/10/19 0411 07/10/19 0411 07/11/19 1450 07/12/19 0735 07/13/19 0350  WBC 7.5   < > 9.1   < > 5.0 6.6 10.0  HGB 10.5*   < > 9.8*   < > 8.3* 10.0* 9.5*  HCT 34.1*   < > 31.8*   < > 27.3* 33.4* 31.9*  MCV 96.3  --  94.4  --  96.1 94.9 96.7  PLT 279   < > 267   < > 235 259 246   < > = values in this interval not displayed.   Blood Culture    Component Value Date/Time   SDES  BLOOD RIGHT HAND 06/17/2019 1223   [place]  06/17/2019 1223    BOTTLES DRAWN AEROBIC ONLY Blood Culture results may not be optimal due to an inadequate volume of blood received in culture bottles   CULT  06/17/2019 1223    NO GROWTH 5 DAYS Performed at [facility], [place] [address], [place], [place] [address]    REPTSTATUS 06/22/2019 FINAL 06/17/2019 1223    Studies/Results: PERIPHERAL VASCULAR CATHETERIZATION  Result Date: 07/13/2019 . Patient name: KARMYN LOWMAN MRN: [id number] DOB: 05/22/1950 Sex: female 07/12/2019 Pre-operative Diagnosis: right leg rest pain Post-operative diagnosis:  Same Surgeon:  Annamarie Major Procedure Performed:  1.  Ultrasound-guided access, left femoral artery  2.  Shockwave intra-arterial lithotripsy, right superficial femoral artery  3.  Drug-coated balloon angioplasty, right superficial femoral artery  4.  Conscious sedation, 52 minutes  Indications: The patient underwent last week with intervention of the left leg.  She comes back today for  right leg intervention. Procedure:  The patient was identified in the holding area and taken to room 8.  The patient was then placed supine on the table and prepped and draped in the usual sterile fashion.  A time out was called.  Conscious sedation was administered with the use of IV fentanyl and Versed under continuous physician and nurse monitoring.  Heart rate, blood pressure, and oxygen saturation were continuously monitored.  Total sedation time was 52 minutes.  Ultrasound was used to evaluate the left common femoral artery.  It was patent .  A digital ultrasound image was acquired.  A micropuncture needle was used to access the left common femoral artery under ultrasound guidance.  An 018 wire was advanced without resistance and a micropuncture sheath was placed.  The 018 wire was removed and a benson wire was placed.  The micropuncture sheath was exchanged for a 6 French 45 cm Terumo sheath.  A versa core wire was  advanced over the aortic bifurcation which was followed by the 6 French sheath which was placed into the right superficial femoral artery.  The patient was then fully heparinized.  Next, right leg runoff was performed Findings:  Right Lower Extremity: Right common femoral and profundofemoral artery widely patent.  The superficial femoral artery is patent however the proximal third of the artery has luminal narrowing draining then 50%.  At the adductor canal there is a short segment lesion which is heavily calcified with greater than 70% stenosis.  The anterior tibial is the dominant runoff vessel.  Intervention: At this point, the patient was fully heparinized.  I advanced a 014 wire across the lesions into the below-knee popliteal artery.  I selected a 4 x 40 shockwave lithotripsy balloon and perform balloon angioplasty with lithotripsy in the superficial femoral artery for lesion length of approximately 200 cm.  Once this was completed at 2 and 4 atm on the balloon, a Ranger 4 x 200 balloon was inserted and drug-coated balloon angioplasty was performed for 3 minutes.  Completion imaging showed inline flow with residual stenosis less than 10%.  There was a nonflow limiting dissection at the proximal and distal edge of the intervention which I elected to monitor.  I did not want to put a stent in because of the small size of her arteries.  At this point sheath and wire were removed.  The patient particularly here for sheath electrocoagulation profile correct Impression:  #1 successful shockwave intra-arterial lithotripsy and subsequent drug-coated balloon angioplasty using 4 mm balloons to the right superficial femoral artery with minimal residual stenosis.  There was a nonflow limiting dissection that was not intervened on.  Theotis Burrow, M.D., [facility] [place] Office: [phone] Pager:  [phone]  Medications: . sodium chloride 10 mL/hr at 07/12/19 0829  . sodium  chloride     . allopurinol  100 mg Oral Daily  . amiodarone  200 mg Oral BID  . aspirin EC  81 mg Oral Daily  . calcitRIOL  0.5 mcg Oral Q M,W,F-HD  . Chlorhexidine Gluconate Cloth  6 each Topical Q0600  . cholecalciferol  2,000 Units Oral Daily  . clopidogrel  75 mg Oral Q breakfast  . darbepoetin (ARANESP) injection - DIALYSIS  150 mcg Intravenous Q Sat-HD  . dicyclomine  10 mg Oral QHS  . docusate sodium  100 mg Oral BID  . feeding supplement (NEPRO CARB STEADY)  237 mL Oral BID BM  . ferric citrate  210 mg  Oral TID WC  . isosorbide mononitrate  30 mg Oral QHS  . metoprolol tartrate  50 mg Oral BID  . mometasone-formoterol  2 puff Inhalation BID  . pantoprazole  40 mg Oral Q12H  . pravastatin  80 mg Oral QPM  . sodium chloride flush  3 mL Intravenous Q12H  . sucralfate  1 g Oral Q6H    Dialysis Orders: [facility] 4 hr EDW 48 kg F180 BFR 350 mL/ min DFR A1.5 UF profile 2no heparin Venofer 100 mg q rx x 9, started 5/27 Mircera 200 q 2 weeks, last given 06/02/19 Calcitriol 1.0 cmg q rx  TDC placed 03/2018 at [facility]  Assessment/Plan: 1.Recurrent GIB - melanoic stools on HD with abdominal pain -s/p 4 units PRBC GI following- presumed bleeding from previously diagnosed severe duodenal ulcerations,recent acute GIB in May with multiple deep duodenal ulcers -On PPI bid.Repeat EGD 6/8 showed multiple plagues in the mid and distal esophagus, smal HH, two non-bleeding duodenal ulcers. No heparin.  2. ESRD- TTS schedule, changed to MWF to accommodate SNF. next HD 6/18.K+ 4.1.AVFhas been running well with 15g needles but given new pain asked vascular to evaluate before removing TDC.  3. Anemia-Acute on chronic 2/2 #1.hgbstabilizedwith slight post op drop.Aranesp140m q Saturday,s/p course of Fe. 4. Secondary hyperparathyroidism-Corrected calcium 9.9. Calcitriol dose has been decreased for hypercalcemia, now resolved. Phos 2.3 > 3.4- decreased auryxia from 3 tabs  TID to 1 tab TID on 6/15.  5.HTN/volumeVolume overloaded on admitbut now much improved. Weights variable here, will follow post HD. Needs a new EDW at discharge.Continuehome dilt 120 daily , cont metoprolol.Cardizem d/c due to bradycardia. 6. Nutrition-On renal diet, protein supplements and vitamin. 7. Mesenteric ischemia s/p SMA stent 05/2019 8.PAD -ischemic left 4th and 5th toes, s/p arteriogram 6/11 with interventions to L CIA (lithotripsy/drug-coated balloon/ stent), L EIA (self expanding stent) and L SFA (lithotripsy/ angioplasty).  9. Atrial fibrillation -- on amiodarone/metoprolol 10. Hx cocaine abuse - UDS negative  11. 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 DNR 13. Disp -tentative SNF  SAnice Paganini PA-C 07/13/2019, 10:40 AM  [facility] Pager: [phone] Pt seen, examined and agree w A/P as above.  RKelly Splinter MD 07/13/2019, 2:15 PM

## 2019-07-14 ENCOUNTER — Inpatient Hospital Stay (HOSPITAL_COMMUNITY): Payer: Medicare Other

## 2019-07-14 HISTORY — PX: IR REMOVAL TUN CV CATH W/O FL: IMG2289

## 2019-07-14 MED ORDER — LIDOCAINE HCL 1 % IJ SOLN
INTRAMUSCULAR | Status: AC
  Filled 2019-07-14: qty 20

## 2019-07-14 MED ORDER — CHLORHEXIDINE GLUCONATE CLOTH 2 % EX PADS: 6.0000 | MEDICATED_PAD | Freq: Every day | CUTANEOUS | Status: DC

## 2019-07-14 MED ORDER — NEPRO/CARBSTEADY PO LIQD: 237.0000 mL | Freq: Two times a day (BID) | ORAL | 0 refills | Status: AC

## 2019-07-14 MED ORDER — LIDOCAINE HCL 1 % IJ SOLN
INTRAMUSCULAR | Status: DC | PRN
  Administered 2019-07-14: 10 mL

## 2019-07-14 MED ORDER — METOPROLOL TARTRATE 50 MG PO TABS: 50.0000 mg | ORAL_TABLET | Freq: Two times a day (BID) | ORAL | Status: AC

## 2019-07-14 MED ORDER — DICYCLOMINE HCL 10 MG PO CAPS: 10.0000 mg | ORAL_CAPSULE | Freq: Every evening | ORAL | Status: AC | PRN

## 2019-07-14 MED ORDER — CHLORHEXIDINE GLUCONATE 4 % EX LIQD
CUTANEOUS | Status: AC
  Filled 2019-07-14: qty 15

## 2019-07-14 NOTE — Procedures (Signed)
PROCEDURE SUMMARY:  Successful removal of tunneled hemodialysis catheter.  Patient tolerated well.  EBL < 5 mL  See full dictation in Imaging for details.  Quinisha Mould S Toshiye Kever PA-C 07/14/2019 11:53 AM

## 2019-07-14 NOTE — Progress Notes (Signed)
AVS printed for receiving facility. Iv removed, clean and intact. VSS. Pt escorted via [facility] to [place].   Arletta Bale, RN

## 2019-07-14 NOTE — Progress Notes (Signed)
[facility] Progress Note   Subjective:   Pt seen on HD. Feeling better, states "I'm less depressed today." Reports intermittent burning in her feet. Denies pain in AVF. No SOB, CP, palpitations, dizziness, N/V/D.  Objective Vitals:   07/14/19 0007 07/14/19 0354 07/14/19 0726 07/14/19 0738  BP: (!) 109/54 128/66 125/70   Pulse: 60 (!) 57 63   Resp: _0 Temp: 98.4 F (36.9 C) 98 F (36.7 C) 98.4 F (36.9 C)   TempSrc: Oral Oral Oral   SpO2: 100% 100% 96% 98%  Weight:  40.5 kg    Height:       Physical Exam General: Well developed female, alert and in NAD Heart: RRR, no murmurs, rubs or gallops Lungs: CTA bilatearlly without wheezing, rhonchi or rales Abdomen: Soft, non-tender, non-distended. +BS Extremities: No edema b/l lower extremities Dialysis Access:  AVF + thrill/bruit, some bruising lateral arm but not over AVF. No edema.   Additional Objective Labs: Basic Metabolic Panel: Recent Labs  Lab 07/08/19 1902 07/08/19 1902 07/11/19 1450 07/12/19 0234 07/13/19 0350  NA 137   < > 137 141 136  K 3.8   < > 3.5 3.9 4.1  CL 103   < > 101 104 103  CO2 23   < > _1 GLUCOSE 93   < > 143* 96 200*  BUN 23   < > 18 <5* 18  CREATININE 4.87*   < > 3.96* 2.28* 4.01*  CALCIUM 8.6*   < > 7.7* 8.1* 8.4*  PHOS 5.0*  --  2.3*  --  3.4   < > = values in this interval not displayed.   Liver Function Tests: Recent Labs  Lab 07/08/19 1902 07/11/19 1450 07/13/19 0350  ALBUMIN 2.1* 2.0* 2.1*   No results for input(s): LIPASE, AMYLASE in the last 168 hours. CBC: Recent Labs  Lab 07/08/19 1902 07/08/19 1902 07/10/19 0411 07/10/19 0411 07/11/19 1450 07/12/19 0735 07/13/19 0350  WBC 7.5   < > 9.1   < > 5.0 6.6 10.0  HGB 10.5*   < > 9.8*   < > 8.3* 10.0* 9.5*  HCT 34.1*   < > 31.8*   < > 27.3* 33.4* 31.9*  MCV 96.3  --  94.4  --  96.1 94.9 96.7  PLT 279   < > 267   < > 235 259 246   < > = values in this interval not displayed.   Blood  Culture    Component Value Date/Time   SDES BLOOD RIGHT HAND 06/17/2019 1223   [place]  06/17/2019 1223    BOTTLES DRAWN AEROBIC ONLY Blood Culture results may not be optimal due to an inadequate volume of blood received in culture bottles   CULT  06/17/2019 1223    NO GROWTH 5 DAYS Performed at [facility], [place] [address], [place], [place] [address]    REPTSTATUS 06/22/2019 FINAL 06/17/2019 1223    Studies/Results: PERIPHERAL VASCULAR CATHETERIZATION  Result Date: 07/13/2019 . Patient name: Christina [name] MRN: [id number] DOB: 10-09-1950 Sex: female 07/12/2019 Pre-operative Diagnosis: right leg rest pain Post-operative diagnosis:  Same Surgeon:  Annamarie Major Procedure Performed:  1.  Ultrasound-guided access, left femoral artery  2.  Shockwave intra-arterial lithotripsy, right superficial femoral artery  3.  Drug-coated balloon angioplasty, right superficial femoral artery  4.  Conscious sedation, 52 minutes  Indications: The patient underwent last week with intervention of the left leg.  She comes back  today for right leg intervention. Procedure:  The patient was identified in the holding area and taken to room 8.  The patient was then placed supine on the table and prepped and draped in the usual sterile fashion.  A time out was called.  Conscious sedation was administered with the use of IV fentanyl and Versed under continuous physician and nurse monitoring.  Heart rate, blood pressure, and oxygen saturation were continuously monitored.  Total sedation time was 52 minutes.  Ultrasound was used to evaluate the left common femoral artery.  It was patent .  A digital ultrasound image was acquired.  A micropuncture needle was used to access the left common femoral artery under ultrasound guidance.  An 018 wire was advanced without resistance and a micropuncture sheath was placed.  The 018 wire was removed and a benson wire was placed.  The micropuncture sheath was exchanged for a 6 French  45 cm Terumo sheath.  A versa core wire was advanced over the aortic bifurcation which was followed by the 6 French sheath which was placed into the right superficial femoral artery.  The patient was then fully heparinized.  Next, right leg runoff was performed Findings:  Right Lower Extremity: Right common femoral and profundofemoral artery widely patent.  The superficial femoral artery is patent however the proximal third of the artery has luminal narrowing draining then 50%.  At the adductor canal there is a short segment lesion which is heavily calcified with greater than 70% stenosis.  The anterior tibial is the dominant runoff vessel.  Intervention: At this point, the patient was fully heparinized.  I advanced a 014 wire across the lesions into the below-knee popliteal artery.  I selected a 4 x 40 shockwave lithotripsy balloon and perform balloon angioplasty with lithotripsy in the superficial femoral artery for lesion length of approximately 200 cm.  Once this was completed at 2 and 4 atm on the balloon, a Ranger 4 x 200 balloon was inserted and drug-coated balloon angioplasty was performed for 3 minutes.  Completion imaging showed inline flow with residual stenosis less than 10%.  There was a nonflow limiting dissection at the proximal and distal edge of the intervention which I elected to monitor.  I did not want to put a stent in because of the small size of her arteries.  At this point sheath and wire were removed.  The patient particularly here for sheath electrocoagulation profile correct Impression:  #1 successful shockwave intra-arterial lithotripsy and subsequent drug-coated balloon angioplasty using 4 mm balloons to the right superficial femoral artery with minimal residual stenosis.  There was a nonflow limiting dissection that was not intervened on.  Theotis Burrow, M.D., [facility] [place] Office: [phone] Pager:  [phone]  Medications: . sodium chloride  10 mL/hr at 07/12/19 1259  . sodium chloride     . allopurinol  100 mg Oral Daily  . amiodarone  200 mg Oral BID  . aspirin EC  81 mg Oral Daily  . calcitRIOL  0.5 mcg Oral Q M,W,F-HD  . Chlorhexidine Gluconate Cloth  6 each Topical Q0600  . cholecalciferol  2,000 Units Oral Daily  . clopidogrel  75 mg Oral Q breakfast  . darbepoetin (ARANESP) injection - DIALYSIS  150 mcg Intravenous Q Sat-HD  . dicyclomine  10 mg Oral QHS  . docusate sodium  100 mg Oral BID  . feeding supplement (NEPRO CARB STEADY)  237 mL Oral BID BM  . ferric citrate  210 mg Oral TID WC  . isosorbide mononitrate  30 mg Oral QHS  . metoprolol tartrate  50 mg Oral BID  . mometasone-formoterol  2 puff Inhalation BID  . pantoprazole  40 mg Oral Q12H  . pravastatin  80 mg Oral QPM  . sodium chloride flush  3 mL Intravenous Q12H  . sucralfate  1 g Oral Q6H    Dialysis Orders: [facility] T[facility] 4 hr EDW 48 kg F180 BFR 350 mL/ min DFR A1.5 UF profile 2no heparin Venofer 100 mg q rx x 9, started 5/27 Mircera 200 q 2 weeks, last given 06/02/19 Calcitriol 1.0 cmg q rx  [facility]DC [facility] 03/2018 at [facility]  Assessment/Plan: 1.Recurrent GIB - melanoic stools on HD with abdominal pain -s/p 4 units PRBC GI following- presumed bleeding from previously diagnosed severe duodenal ulcerations,recent acute GIB in May with multiple deep duodenal ulcers -On PPI bid.Repeat EGD 6/8 showed multiple plagues in the mid and distal esophagus, smal HH, two non-bleeding duodenal ulcers.  Do not recommend long term use of carafate due to risk of aluminum toxicity in ESRD pts.  2. ESRD- TTS schedule,changed to MWF to accommodate SNF.next HD 6/18.K+ 4.1.AVFhas been running well with 15g needles, Pt reported pain in AVF yesterday, examined by myself and vascular with no evidence of any issues. Pain resolved and pt agrees to have [place] removed today. IR consulted.   3. Anemia-Acute on chronic 2/2 #1.hgbstabilizedwith slight post op  drop.Aranesp185m q Saturday,s/p course of Fe. 4. Secondary hyperparathyroidism-Corrected calcium9.9. Calcitriol dose has been decreased for hypercalcemia, now resolved. Phos 2.3 > 3.4- decreased auryxia from 3 tabs TID to 1 tab TID on 6/15. 5.HTN/volumeVolume overloaded on admitbut now much improved. Weights variable here, will follow post HD. Needs a new EDW at discharge.Continuehome dilt 120 daily , cont metoprolol.Cardizem d/c due to bradycardia. 6. Nutrition-On renal diet, protein supplements and vitamin. 7. Mesenteric ischemia s/p SMA stent 05/2019 8.PAD -ischemic left 4th and 5th toes, s/p arteriogram 6/11 with interventions to L CIA (lithotripsy/drug-coated balloon/ stent), L EIA (self expanding stent) and L SFA (lithotripsy/ angioplasty).  9. Atrial fibrillation -- on amiodarone/metoprolol 10. Hx cocaine abuse - UDS negative  11. 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 DNR 13. Disp -tentative SNF  SAnice Paganini PA-C 07/14/2019, 10:10 AM  [facility] Pager: [phone]

## 2019-07-14 NOTE — Progress Notes (Signed)
    Subjective  -   Status post bilateral lower extremity angiogram and intervention bilaterally. She states her feet feel better   Physical Exam:  Dry eschar on the tips of the right fifth toe and right She has palpable dorsalis pedis pulse bilaterally. Both groins are soft   Assessment/Plan:    The patient is stable from a vascular perspective, now with palpable pedal pulses.  She will continue with dual antiplatelet therapy (aspirin and Plavix) as well as statin therapy.  She is scheduled to go to a nursing home in the immediate future.  I have her scheduled for follow-up with me in the office in 1 month.  I will sign off  Wells Nicholes Hibler 07/14/2019 7:52 AM --  Vitals:   07/14/19 0726 07/14/19 0738  BP: 125/70   Pulse: 63   Resp: 16   Temp: 98.4 F (36.9 C)   SpO2: 96% 98%    Intake/Output Summary (Last 24 hours) at 07/14/2019 0752 Last data filed at 07/13/2019 1700 Gross per 24 hour  Intake 60.35 ml  Output 1950 ml  Net -1889.65 ml     Laboratory CBC    Component Value Date/Time   WBC 10.0 07/13/2019 0350   HGB 9.5 (L) 07/13/2019 0350   HCT 31.9 (L) 07/13/2019 0350   PLT 246 07/13/2019 0350    BMET    Component Value Date/Time   NA 136 07/13/2019 0350   K 4.1 07/13/2019 0350   CL 103 07/13/2019 0350   CO2 24 07/13/2019 0350   GLUCOSE 200 (H) 07/13/2019 0350   BUN 18 07/13/2019 0350   CREATININE 4.01 (H) 07/13/2019 0350   CALCIUM 8.4 (L) 07/13/2019 0350   GFRNONAA 11 (L) 07/13/2019 0350   GFRAA 12 (L) 07/13/2019 0350    COAG Lab Results  Component Value Date   INR 1.2 06/14/2019   INR 1.1 03/25/2019   INR 0.9 01/26/2019   No results found for: PTT  Antibiotics Anti-infectives (From admission, onward)   None       V. Leia Alf, M.D., [facility] [place] Office: [phone] Pager:  [phone]

## 2019-07-14 NOTE — TOC Transition Note (Addendum)
Transition of Care [facility]) - CM/SW Discharge Note   Patient Details  Name: Christina [name] MRN: [id number] Date of Birth: 1950-03-24  [facility] CM/SW Contact:  Vinie Sill, [place] Phone Number: 07/14/2019, 1:26 PM   Clinical Narrative:     Patient will DC to: [facility] Date: 07/14/2019 Family Notified: Michelle,daughter Transport By: Corey Harold   Per MD patient is ready for discharge. RN, patient, and facility notified of DC. Discharge Summary sent to facility. RN given number for [phone]. Ambulance transport requested for patient.   Clinical Social Worker signing off.  Thurmond Butts, MSW, LCSWA Clinical Social Worker    Final next level of care: Skilled Nursing Facility Barriers to Discharge: Continued Medical Work up, Ship broker   Patient Goals and CMS Choice Patient states their goals for this hospitalization and ongoing recovery are:: Rehab CMS Medicare.gov Compare Post Acute Care list provided to:: Patient Represenative (must comment) ([name]) Choice offered to / list presented to : Adult Children  Discharge Placement                       Discharge Plan and Services In-house Referral: Clinical Social Work   Post Acute Care Choice: [place]                               Social Determinants of Health (SDOH) Interventions     Readmission Risk Interventions Readmission Risk Prevention Plan 07/05/2019 06/17/2019 03/29/2019  Transportation Screening Complete Complete Complete  Medication Review Press photographer) Complete Complete Complete  PCP or Specialist appointment within 3-5 days of discharge Complete - -  [place] or Home Care Consult Complete Complete Complete  SW Recovery Care/Counseling Consult Complete Complete Complete  Palliative Care Screening Not Applicable Not Applicable Not Applicable  Skilled Nursing Facility Complete (No Data) Not Applicable  Some recent  data might be hidden

## 2019-07-14 NOTE — Progress Notes (Signed)
Progress Note    07/14/2019 7:50 AM   Subjective:  Complaining of bilateral groin pain.  Tolerating diet without. Denies foot pain   Vitals:   07/14/19 0726 07/14/19 0738  BP: 125/70   Pulse: 63   Resp: 16   Temp: 98.4 F (36.9 C)   SpO2: 96% 98%    Physical Exam: Cardiac:  RRR Lungs:  nonlabored Extremities:  Sensation motor function intact. + DP, peroneal and PT doppoler signals bilaterally.  LUE: ecchymotic areas without skin breakdown or aneurysmal dilitation. +b and t Abdomen:  Soft ND. +BS.  Extensive eccymosis of left groin and proximal thigh unchanged from yesterday        CBC    Component Value Date/Time   WBC 10.0 07/13/2019 0350   RBC 3.30 (L) 07/13/2019 0350   HGB 9.5 (L) 07/13/2019 0350   HCT 31.9 (L) 07/13/2019 0350   PLT 246 07/13/2019 0350   MCV 96.7 07/13/2019 0350   MCH 28.8 07/13/2019 0350   MCHC 29.8 (L) 07/13/2019 0350   RDW 16.4 (H) 07/13/2019 0350   LYMPHSABS 0.9 06/29/2019 1610   MONOABS 0.5 06/29/2019 1610   EOSABS 0.1 06/29/2019 1610   BASOSABS 0.1 06/29/2019 1610    BMET    Component Value Date/Time   NA 136 07/13/2019 0350   K 4.1 07/13/2019 0350   CL 103 07/13/2019 0350   CO2 24 07/13/2019 0350   GLUCOSE 200 (H) 07/13/2019 0350   BUN 18 07/13/2019 0350   CREATININE 4.01 (H) 07/13/2019 0350   CALCIUM 8.4 (L) 07/13/2019 0350   GFRNONAA 11 (L) 07/13/2019 0350   GFRAA 12 (L) 07/13/2019 0350     Intake/Output Summary (Last 24 hours) at 07/14/2019 0750 Last data filed at 07/13/2019 1700 Gross per 24 hour  Intake 60.35 ml  Output 1950 ml  Net -1889.65 ml    HOSPITAL MEDICATIONS Scheduled Meds: . allopurinol  100 mg Oral Daily  . amiodarone  200 mg Oral BID  . aspirin EC  81 mg Oral Daily  . calcitRIOL  0.5 mcg Oral Q M,W,F-HD  . Chlorhexidine Gluconate Cloth  6 each Topical Q0600  . cholecalciferol  2,000 Units Oral Daily  . clopidogrel  75 mg Oral Q breakfast  . darbepoetin (ARANESP) injection - DIALYSIS  150  mcg Intravenous Q Sat-HD  . dicyclomine  10 mg Oral QHS  . docusate sodium  100 mg Oral BID  . feeding supplement (NEPRO CARB STEADY)  237 mL Oral BID BM  . ferric citrate  210 mg Oral TID WC  . isosorbide mononitrate  30 mg Oral QHS  . metoprolol tartrate  50 mg Oral BID  . mometasone-formoterol  2 puff Inhalation BID  . pantoprazole  40 mg Oral Q12H  . pravastatin  80 mg Oral QPM  . sodium chloride flush  3 mL Intravenous Q12H  . sucralfate  1 g Oral Q6H   Continuous Infusions: . sodium chloride 10 mL/hr at 07/12/19 1259  . sodium chloride     PRN Meds:.sodium chloride, acetaminophen, clobetasol cream, diphenhydrAMINE, haloperidol **OR** haloperidol lactate, hydrALAZINE, hydrALAZINE, hydrALAZINE, ipratropium-albuterol, labetalol, nitroGLYCERIN, ondansetron (ZOFRAN) IV, ondansetron, oxyCODONE-acetaminophen, polyethylene glycol, senna-docusate, sodium chloride flush  Assessment:  Status post right and left lower extremity arteriogram with intervention. Left CIA, EIA stents. Right SFA intra-arterial lithotripsy and angioplasty. Small, non-expanding left groin hematoma, extensive ecchymosis  Plan: -Follow-up for SMA stent previously arranged.  She will follow-up lower extremity intervention in 4-6 weeks.  I explained to her that  she needs to watch for drainage, skin breakdown of toes/feet and to notify us immediately if this occurs.  Continue Plavix, asa, statin. Risa Grill, PA-C [facility] [phone] 07/14/2019  7:50 AM

## 2019-07-14 NOTE — Progress Notes (Addendum)
Report given to RN at [facility]. All questions answered.    Arletta Bale, RN

## 2019-07-14 NOTE — Discharge Summary (Signed)
Physician Discharge Summary  Christina [name] ZJI:[id number] DOB: October 17, 1950 DOA: 06/29/2019  PCP: Maggie Schwalbe, PA-C  Admit date: 06/29/2019 Discharge date: 07/14/2019  Admitted From: Home Discharge disposition: SNF   Code Status: DNR  Diet Recommendation: Renal diet   Recommendations for Outpatient Follow-Up:   1. Follow-up with PCP as an outpatient 2. Follow-up with nephrology as an outpatient  Discharge Diagnosis:   Active Problems:   ESRD (end stage renal disease) on dialysis ([name])   Duodenal ulcer with hemorrhage   GI bleed   History of duodenal ulcer   Symptomatic anemia   Melena   History of Present Illness / Brief narrative:  69 year old female with PMH significant of hypertension, hyperlipidemia, CAD, atrial fibrillation, COPD, ESRD on HD TTS, SBO, rectal prolapse, polysubstance abuse including cocaine. This is her 10th hospitalization in the last 6 months.   Patient presented to ED on 6/2 with complaints of rectal pain aggressively worsening over the past 4 days. Patient missed her hemodialysis due to pain.  Patient was found to be hypotensive on admission with BP 76/52, which responded to PRBC. But she was continued to have melena during hospital stay. She was seen by GI,underwent endoscopy and arteriogram with stent placement as per vascular surgery.   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 Patient also has chronic A. fib but is no longer on anticoagulation because of duodenal ulcer.  5/8-5/9, hospitalized for peripheral arterial disease and gangrene of left foot for which she was discharged to follow with Dr. Oneida Alar. Dr. Oneida Alar has been contemplating amputating her toes.  Hospital Course:  GI bleed/acute blood loss anemia Acute blood loss anemia -endoscopy showed duodenal ulcer with esophageal  plaques. Biopsywas negative for malignancy, and consistent with esophagitis.  -Patient was transfused4units PRBC. Hemoglobin is stable at 10.0  -aspirin and Plavix has been restarted. -Continue Protonix 40 mg p.o. twice daily.  -Carafate to avoid because of coexisting renal failure.  Peripheral vascular disease with ischemic changes without gangrene -s/p stent placement in left common iliac artery, left external iliac artery. -vascular surgery following.  -Continue aspirin and Plavix. -6/15, underwent right lower extremity arteriogram with balloon angioplasty.  Delirium -resolved, mental status likely at baseline. Patient had deliriumduring this hospitalizationfor which she required multiple doses ofIM Haldol.  Mental status currently normal.  ESRD on hemodialysis-nephrology following.  MWF dialysis.  Hypertension -blood pressure stable on metoprolol 50 mg twice daily.  Paroxysmal atrial fibrillation -Continue amiodarone and metoprolol.  Cardizem has been stopped because of bradycardia. -she is not on anticoagulation due to history of GI bleed.  Hyperlipidemia-continue pravastatin.  COPD without acute exacerbation -continue Dulera, as needed DuoNebs.  Rectal prolapse-acute on chronic, lidocaine jelly as needed.  Mobility: Encourage ambulation.  SNF recommended by PT Code Status: DNR  Okay to discharge to SNF today.  Subjective:  Seen and examined this morning.  Pleasant.  Not in pain.  Yesterday she was complaining of pain at the groin site.  Examined by vascular surgery.  No new intervention planned.  This morning, patient does not complain of any pain.  Discharge Exam:   Vitals:   07/14/19 0007 07/14/19 0354 07/14/19 0726 07/14/19 0738  BP: (!) 109/54 128/66 125/70   Pulse: 60 (!) 57 63   Resp: 14 18 16    Temp: 98.4 F (36.9 C) 98 F (36.7 C) 98.4 F (36.9 C)  TempSrc: Oral Oral Oral   SpO2: 100% 100% 96% 98%  Weight:  40.5 kg    Height:         Body mass index is 16.33 kg/m.  General exam: Appears calm and comfortable.  Skin: No rashes, lesions or ulcers. HEENT: Atraumatic, normocephalic, supple neck, no obvious bleeding Lungs: Clear to auscultation bilaterally CVS: Regular rate and rhythm, no murmur GI/Abd soft, nontender, nondistended, bowel sound present CNS: Alert, awake, oriented x3. Psychiatry: Mood appropriate Extremities: No pedal edema, no calf tenderness  Discharge Instructions:  Wound care: Dry dressing of intravascular catheter insertion site.     Discharge Instructions    Increase activity slowly   Complete by: As directed    Leave dressing on - Keep it clean, dry, and intact until clinic visit   Complete by: As directed       Contact information for follow-up providers    Christina Mitchell, MD Follow up in 1 month(s).   Specialties: Vascular Surgery, Cardiology Why: Office will call with appt (sent) Contact information: [place] [address] [phone]        Maggie Schwalbe, PA-C Follow up.   Specialty: Physician Assistant Contact information: [place] [address] [phone]            Contact information for after-discharge care    Destination    [facility] .   Service: Skilled Nursing Contact information: [address] [place] [address] [phone]                 Allergies as of 07/14/2019      Reactions   Betaine Anaphylaxis   Cyclobenzaprine Anaphylaxis, Other (See Comments)   "stopped heart"   Morphine Anaphylaxis   "stopped heart"   Penicillins Shortness Of Breath, Swelling, Palpitations   Did it involve swelling of the face/tongue/throat, SOB, or low BP? Yes Did it involve sudden or severe rash/hives, skin peeling, or any reaction on the inside of your mouth or nose? No Did you need to seek medical attention at a hospital or doctor's office? No When did it  last happen?years  If all above answers are "NO", may proceed with cephalosporin use.   Ambien [zolpidem] Other (See Comments)   Severe confusion   Codeine Itching, Rash   Hydromorphone Other (See Comments)   If administered quickly, felt like hand was "exploding"       Medication List    STOP taking these medications   sucralfate 1 GM/10ML suspension Commonly known as: CARAFATE     TAKE these medications   albuterol (2.5 MG/3ML) 0.083% nebulizer solution Commonly known as: PROVENTIL Take 2.5 mg by nebulization every 6 (six) hours as needed for wheezing or shortness of breath.   allopurinol 100 MG tablet Commonly known as: ZYLOPRIM Take 100 mg by mouth daily.   amiodarone 200 MG tablet Commonly known as: PACERONE Take 1 tablet (200 mg total) by mouth 2 (two) times daily. What changed: when to take this   aspirin 81 MG EC tablet Take 81 mg by mouth daily.   Auryxia 1 GM 210 MG(Fe) tablet Generic drug: ferric citrate Take 630 mg by mouth 3 (three) times daily with meals. 2 tablets with a snack = 420mg    budesonide-formoterol 160-4.5 MCG/ACT inhaler Commonly known as: SYMBICORT Inhale 2 puffs into the lungs every 4 (four) hours as needed (shortness of breath/wheezing).   clopidogrel 75 MG tablet Commonly known  as: PLAVIX Take 75 mg by mouth daily.   dicyclomine 10 MG capsule Commonly known as: BENTYL Take 1 capsule (10 mg total) by mouth at bedtime as needed for spasms. What changed:   when to take this  reasons to take this   diphenhydrAMINE 25 MG tablet Commonly known as: BENADRYL Take 25 mg by mouth at bedtime as needed for sleep.   docusate sodium 100 MG capsule Commonly known as: COLACE Take 1 capsule (100 mg total) by mouth 2 (two) times daily. What changed:   how much to take  when to take this   feeding supplement (NEPRO CARB STEADY) Liqd Take 237 mLs by mouth 2 (two) times daily between meals.   Ipratropium-Albuterol 20-100 MCG/ACT  Aers respimat Commonly known as: COMBIVENT Inhale 2 puffs into the lungs 4 (four) times daily as needed for wheezing.   isosorbide mononitrate 30 MG 24 hr tablet Commonly known as: IMDUR Take 30 mg by mouth at bedtime.   lidocaine-prilocaine cream Commonly known as: EMLA Apply 1 application topically daily as needed (pain at the site of port access).   metoprolol tartrate 50 MG tablet Commonly known as: LOPRESSOR Take 1 tablet (50 mg total) by mouth 2 (two) times daily. What changed: how much to take   nitroGLYCERIN 0.4 MG SL tablet Commonly known as: NITROSTAT Place 0.4 mg under the tongue every 5 (five) minutes as needed for chest pain.   ondansetron 4 MG tablet Commonly known as: ZOFRAN Take 4 mg by mouth 3 (three) times daily as needed for nausea.   pantoprazole 40 MG tablet Commonly known as: PROTONIX Take 1 tablet (40 mg total) by mouth 2 (two) times daily. What changed: when to take this   polyethylene glycol 17 g packet Commonly known as: MIRALAX / GLYCOLAX Take 17 g by mouth daily as needed for mild constipation. What changed:   when to take this  additional instructions   pravastatin 80 MG tablet Commonly known as: PRAVACHOL Take 80 mg by mouth every evening.   sennosides-docusate sodium 8.6-50 MG tablet Commonly known as: SENOKOT-S Take 1 tablet by mouth as needed for constipation.   Vitamin D3 50 MCG (2000 UT) capsule Take 2,000 Units by mouth daily.            Discharge Care Instructions  (From admission, onward)         Start     Ordered   07/14/19 0000  Leave dressing on - Keep it clean, dry, and intact until clinic visit        07/14/19 1129          Time coordinating discharge: 35 minutes  The results of significant diagnostics from this hospitalization (including imaging, microbiology, ancillary and laboratory) are listed below for reference.    Procedures and Diagnostic Studies:   DG CHEST PORT 1 VIEW  Result Date:  06/30/2019 CLINICAL DATA:  GI bleed EXAM: PORTABLE CHEST 1 VIEW COMPARISON:  06/19/2019 FINDINGS: Right-sided hemodialysis catheter terminates at the level of the right atrium. Stable mild cardiomegaly. Atherosclerotic calcification of the aortic knob. Mild diffuse interstitial markings suggesting mild edema. No focal airspace consolidation. No pleural effusion or pneumothorax. IMPRESSION: Mildly prominent diffuse interstitial markings suggesting mild edema. Electronically Signed   By: Davina Poke D.O.   On: 06/30/2019 16:14     Labs:   Basic Metabolic Panel: Recent Labs  Lab 07/08/19 1902 07/08/19 1902 07/11/19 1450 07/11/19 1450 07/12/19 0234 07/13/19 0350  NA 137  --  137  --  141 136  K 3.8   < > 3.5   < > 3.9 4.1  CL 103  --  101  --  104 103  CO2 23  --  25  --  28 24  GLUCOSE 93  --  143*  --  96 200*  BUN 23  --  18  --  <5* 18  CREATININE 4.87*  --  3.96*  --  2.28* 4.01*  CALCIUM 8.6*  --  7.7*  --  8.1* 8.4*  PHOS 5.0*  --  2.3*  --   --  3.4   < > = values in this interval not displayed.   GFR Estimated Creatinine Clearance: 8.5 mL/min (A) (by C-G formula based on SCr of 4.01 mg/dL (H)). Liver Function Tests: Recent Labs  Lab 07/08/19 1902 07/11/19 1450 07/13/19 0350  ALBUMIN 2.1* 2.0* 2.1*   No results for input(s): LIPASE, AMYLASE in the last 168 hours. No results for input(s): AMMONIA in the last 168 hours. Coagulation profile No results for input(s): INR, PROTIME in the last 168 hours.  CBC: Recent Labs  Lab 07/08/19 1902 07/10/19 0411 07/11/19 1450 07/12/19 0735 07/13/19 0350  WBC 7.5 9.1 5.0 6.6 10.0  HGB 10.5* 9.8* 8.3* 10.0* 9.5*  HCT 34.1* 31.8* 27.3* 33.4* 31.9*  MCV 96.3 94.4 96.1 94.9 96.7  PLT 279 267 235 259 246   Cardiac Enzymes: No results for input(s): CKTOTAL, CKMB, CKMBINDEX, TROPONINI in the last 168 hours. BNP: Invalid input(s): POCBNP CBG: No results for input(s): GLUCAP in the last 168 hours. D-Dimer No results for  input(s): DDIMER in the last 72 hours. Hgb A1c No results for input(s): HGBA1C in the last 72 hours. Lipid Profile No results for input(s): CHOL, HDL, LDLCALC, TRIG, CHOLHDL, LDLDIRECT in the last 72 hours. Thyroid function studies No results for input(s): TSH, T4TOTAL, T3FREE, THYROIDAB in the last 72 hours.  Invalid input(s): FREET3 Anemia work up No results for input(s): VITAMINB12, FOLATE, FERRITIN, TIBC, IRON, RETICCTPCT in the last 72 hours. Microbiology Recent Results (from the past 240 hour(s))  SARS CORONAVIRUS 2 (TAT 6-24 HRS) Nasopharyngeal Nasopharyngeal Swab     Status: None   Collection Time: 07/13/19 12:14 PM   Specimen: Nasopharyngeal Swab  Result Value Ref Range Status   SARS Coronavirus 2 NEGATIVE NEGATIVE Final    Comment: (NOTE) SARS-CoV-2 target nucleic acids are NOT DETECTED.  The SARS-CoV-2 RNA is generally detectable in upper and lower respiratory specimens during the acute phase of infection. Negative results do not preclude SARS-CoV-2 infection, do not rule out co-infections with other pathogens, and should not be used as the sole basis for treatment or other patient management decisions. Negative results must be combined with clinical observations, patient history, and epidemiological information. The expected result is Negative.  Fact Sheet for Patients: SugarRoll.be  Fact Sheet for Healthcare Providers: https://www.woods-mathews.com/  This test is not yet approved or cleared by the [place] FDA and  has been authorized for detection and/or diagnosis of SARS-CoV-2 by FDA under an Emergency Use Authorization (EUA). This EUA will remain  in effect (meaning this test can be used) for the duration of the COVID-19 declaration under Se ction 564(b)(1) of the Act, 21 U.S.C. section 360bbb-3(b)(1), unless the authorization is terminated or revoked sooner.  Performed at [facility], [place] [address], [place], [place] [address]     Please note: You were cared for by a hospitalist during your hospital stay. Once you are discharged, your  primary care physician will handle any further medical issues. Please note that NO REFILLS for any discharge medications will be authorized once you are discharged, as it is imperative that you return to your primary care physician (or establish a relationship with a primary care physician if you do not have one) for your post hospital discharge needs so that they can reassess your need for medications and monitor your lab values.  Signed: Marlowe Aschoff Emri Sample  [facility] 07/14/2019, 11:29 AM

## 2019-07-18 NOTE — Progress Notes (Signed)
MD ordered 50 mcg of Fentanyl to be given stat.  Med was given but not scanned. Patient was having severe pain d/t pressure being held on groin for so long.  Late entry note made.

## 2019-08-01 ENCOUNTER — Inpatient Hospital Stay (HOSPITAL_COMMUNITY): Payer: Medicare Other

## 2019-08-01 ENCOUNTER — Inpatient Hospital Stay (HOSPITAL_COMMUNITY)
Admission: AD | Admit: 2019-08-01 | Discharge: 2019-08-12 | DRG: 640 | Disposition: A | Discharge status: Hospice | Payer: Medicare Other | Source: Other Acute Inpatient Hospital | Attending: Internal Medicine | Admitting: Internal Medicine

## 2019-08-01 DIAGNOSIS — Z66 Do not resuscitate: Secondary | ICD-10-CM | POA: Diagnosis present

## 2019-08-01 DIAGNOSIS — Z87442 Personal history of urinary calculi: Secondary | ICD-10-CM

## 2019-08-01 DIAGNOSIS — Z880 Allergy status to penicillin: Secondary | ICD-10-CM

## 2019-08-01 DIAGNOSIS — E785 Hyperlipidemia, unspecified: Secondary | ICD-10-CM | POA: Diagnosis present

## 2019-08-01 DIAGNOSIS — K559 Vascular disorder of intestine, unspecified: Secondary | ICD-10-CM | POA: Diagnosis present

## 2019-08-01 DIAGNOSIS — W19XXXA Unspecified fall, initial encounter: Secondary | ICD-10-CM | POA: Diagnosis present

## 2019-08-01 DIAGNOSIS — M7989 Other specified soft tissue disorders: Secondary | ICD-10-CM | POA: Diagnosis not present

## 2019-08-01 DIAGNOSIS — R29703 NIHSS score 3: Secondary | ICD-10-CM | POA: Diagnosis present

## 2019-08-01 DIAGNOSIS — I82811 Embolism and thrombosis of superficial veins of right lower extremities: Secondary | ICD-10-CM | POA: Diagnosis not present

## 2019-08-01 DIAGNOSIS — W19XXXD Unspecified fall, subsequent encounter: Secondary | ICD-10-CM | POA: Diagnosis not present

## 2019-08-01 DIAGNOSIS — N186 End stage renal disease: Secondary | ICD-10-CM | POA: Diagnosis present

## 2019-08-01 DIAGNOSIS — R079 Chest pain, unspecified: Secondary | ICD-10-CM

## 2019-08-01 DIAGNOSIS — I472 Ventricular tachycardia: Secondary | ICD-10-CM | POA: Diagnosis present

## 2019-08-01 DIAGNOSIS — D6959 Other secondary thrombocytopenia: Secondary | ICD-10-CM | POA: Diagnosis present

## 2019-08-01 DIAGNOSIS — D649 Anemia, unspecified: Secondary | ICD-10-CM | POA: Diagnosis not present

## 2019-08-01 DIAGNOSIS — I340 Nonrheumatic mitral (valve) insufficiency: Secondary | ICD-10-CM

## 2019-08-01 DIAGNOSIS — E875 Hyperkalemia: Principal | ICD-10-CM | POA: Diagnosis present

## 2019-08-01 DIAGNOSIS — Z7982 Long term (current) use of aspirin: Secondary | ICD-10-CM

## 2019-08-01 DIAGNOSIS — R627 Adult failure to thrive: Secondary | ICD-10-CM | POA: Diagnosis present

## 2019-08-01 DIAGNOSIS — I2510 Atherosclerotic heart disease of native coronary artery without angina pectoris: Secondary | ICD-10-CM | POA: Diagnosis present

## 2019-08-01 DIAGNOSIS — E11649 Type 2 diabetes mellitus with hypoglycemia without coma: Secondary | ICD-10-CM | POA: Diagnosis present

## 2019-08-01 DIAGNOSIS — F418 Other specified anxiety disorders: Secondary | ICD-10-CM | POA: Diagnosis present

## 2019-08-01 DIAGNOSIS — Z888 Allergy status to other drugs, medicaments and biological substances status: Secondary | ICD-10-CM

## 2019-08-01 DIAGNOSIS — I6349 Cerebral infarction due to embolism of other cerebral artery: Secondary | ICD-10-CM | POA: Diagnosis not present

## 2019-08-01 DIAGNOSIS — Z885 Allergy status to narcotic agent status: Secondary | ICD-10-CM

## 2019-08-01 DIAGNOSIS — Z681 Body mass index (BMI) 19 or less, adult: Secondary | ICD-10-CM

## 2019-08-01 DIAGNOSIS — Z9114 Patient's other noncompliance with medication regimen: Secondary | ICD-10-CM

## 2019-08-01 DIAGNOSIS — S065X9A Traumatic subdural hemorrhage with loss of consciousness of unspecified duration, initial encounter: Secondary | ICD-10-CM | POA: Diagnosis present

## 2019-08-01 DIAGNOSIS — D631 Anemia in chronic kidney disease: Secondary | ICD-10-CM | POA: Diagnosis present

## 2019-08-01 DIAGNOSIS — I469 Cardiac arrest, cause unspecified: Secondary | ICD-10-CM | POA: Diagnosis present

## 2019-08-01 DIAGNOSIS — I82411 Acute embolism and thrombosis of right femoral vein: Secondary | ICD-10-CM | POA: Diagnosis not present

## 2019-08-01 DIAGNOSIS — Z01818 Encounter for other preprocedural examination: Secondary | ICD-10-CM

## 2019-08-01 DIAGNOSIS — Z515 Encounter for palliative care: Secondary | ICD-10-CM | POA: Diagnosis not present

## 2019-08-01 DIAGNOSIS — Z9115 Patient's noncompliance with renal dialysis: Secondary | ICD-10-CM

## 2019-08-01 DIAGNOSIS — Z955 Presence of coronary angioplasty implant and graft: Secondary | ICD-10-CM

## 2019-08-01 DIAGNOSIS — E440 Moderate protein-calorie malnutrition: Secondary | ICD-10-CM | POA: Insufficient documentation

## 2019-08-01 DIAGNOSIS — I82431 Acute embolism and thrombosis of right popliteal vein: Secondary | ICD-10-CM | POA: Diagnosis not present

## 2019-08-01 DIAGNOSIS — I6340 Cerebral infarction due to embolism of unspecified cerebral artery: Secondary | ICD-10-CM

## 2019-08-01 DIAGNOSIS — Z452 Encounter for adjustment and management of vascular access device: Secondary | ICD-10-CM | POA: Diagnosis not present

## 2019-08-01 DIAGNOSIS — I462 Cardiac arrest due to underlying cardiac condition: Secondary | ICD-10-CM | POA: Diagnosis present

## 2019-08-01 DIAGNOSIS — Z20822 Contact with and (suspected) exposure to covid-19: Secondary | ICD-10-CM | POA: Diagnosis present

## 2019-08-01 DIAGNOSIS — I82441 Acute embolism and thrombosis of right tibial vein: Secondary | ICD-10-CM | POA: Diagnosis not present

## 2019-08-01 DIAGNOSIS — R0989 Other specified symptoms and signs involving the circulatory and respiratory systems: Secondary | ICD-10-CM

## 2019-08-01 DIAGNOSIS — Z87891 Personal history of nicotine dependence: Secondary | ICD-10-CM

## 2019-08-01 DIAGNOSIS — J690 Pneumonitis due to inhalation of food and vomit: Secondary | ICD-10-CM | POA: Diagnosis not present

## 2019-08-01 DIAGNOSIS — E8779 Other fluid overload: Secondary | ICD-10-CM | POA: Diagnosis present

## 2019-08-01 DIAGNOSIS — M109 Gout, unspecified: Secondary | ICD-10-CM | POA: Diagnosis present

## 2019-08-01 DIAGNOSIS — E8889 Other specified metabolic disorders: Secondary | ICD-10-CM | POA: Diagnosis present

## 2019-08-01 DIAGNOSIS — E1165 Type 2 diabetes mellitus with hyperglycemia: Secondary | ICD-10-CM | POA: Diagnosis not present

## 2019-08-01 DIAGNOSIS — Z91199 Patient's noncompliance with other medical treatment and regimen due to unspecified reason: Secondary | ICD-10-CM

## 2019-08-01 DIAGNOSIS — F1010 Alcohol abuse, uncomplicated: Secondary | ICD-10-CM | POA: Diagnosis present

## 2019-08-01 DIAGNOSIS — G9341 Metabolic encephalopathy: Secondary | ICD-10-CM | POA: Diagnosis not present

## 2019-08-01 DIAGNOSIS — I259 Chronic ischemic heart disease, unspecified: Secondary | ICD-10-CM | POA: Diagnosis not present

## 2019-08-01 DIAGNOSIS — E43 Unspecified severe protein-calorie malnutrition: Secondary | ICD-10-CM | POA: Diagnosis present

## 2019-08-01 DIAGNOSIS — K269 Duodenal ulcer, unspecified as acute or chronic, without hemorrhage or perforation: Secondary | ICD-10-CM | POA: Diagnosis present

## 2019-08-01 DIAGNOSIS — D696 Thrombocytopenia, unspecified: Secondary | ICD-10-CM | POA: Diagnosis not present

## 2019-08-01 DIAGNOSIS — R0602 Shortness of breath: Secondary | ICD-10-CM

## 2019-08-01 DIAGNOSIS — R569 Unspecified convulsions: Secondary | ICD-10-CM | POA: Diagnosis not present

## 2019-08-01 DIAGNOSIS — J9601 Acute respiratory failure with hypoxia: Secondary | ICD-10-CM | POA: Diagnosis present

## 2019-08-01 DIAGNOSIS — I639 Cerebral infarction, unspecified: Secondary | ICD-10-CM | POA: Diagnosis not present

## 2019-08-01 DIAGNOSIS — E1122 Type 2 diabetes mellitus with diabetic chronic kidney disease: Secondary | ICD-10-CM | POA: Diagnosis present

## 2019-08-01 DIAGNOSIS — I361 Nonrheumatic tricuspid (valve) insufficiency: Secondary | ICD-10-CM | POA: Diagnosis not present

## 2019-08-01 DIAGNOSIS — E876 Hypokalemia: Secondary | ICD-10-CM | POA: Diagnosis not present

## 2019-08-01 DIAGNOSIS — O2230 Deep phlebothrombosis in pregnancy, unspecified trimester: Secondary | ICD-10-CM

## 2019-08-01 DIAGNOSIS — I953 Hypotension of hemodialysis: Secondary | ICD-10-CM | POA: Diagnosis not present

## 2019-08-01 DIAGNOSIS — Z90710 Acquired absence of both cervix and uterus: Secondary | ICD-10-CM

## 2019-08-01 DIAGNOSIS — Z95820 Peripheral vascular angioplasty status with implants and grafts: Secondary | ICD-10-CM

## 2019-08-01 DIAGNOSIS — Z992 Dependence on renal dialysis: Secondary | ICD-10-CM

## 2019-08-01 DIAGNOSIS — J449 Chronic obstructive pulmonary disease, unspecified: Secondary | ICD-10-CM | POA: Diagnosis present

## 2019-08-01 DIAGNOSIS — K7030 Alcoholic cirrhosis of liver without ascites: Secondary | ICD-10-CM | POA: Diagnosis present

## 2019-08-01 DIAGNOSIS — Z9119 Patient's noncompliance with other medical treatment and regimen: Secondary | ICD-10-CM

## 2019-08-01 DIAGNOSIS — K219 Gastro-esophageal reflux disease without esophagitis: Secondary | ICD-10-CM | POA: Diagnosis present

## 2019-08-01 DIAGNOSIS — I120 Hypertensive chronic kidney disease with stage 5 chronic kidney disease or end stage renal disease: Secondary | ICD-10-CM | POA: Diagnosis present

## 2019-08-01 DIAGNOSIS — E1151 Type 2 diabetes mellitus with diabetic peripheral angiopathy without gangrene: Secondary | ICD-10-CM | POA: Diagnosis present

## 2019-08-01 DIAGNOSIS — I480 Paroxysmal atrial fibrillation: Secondary | ICD-10-CM | POA: Diagnosis present

## 2019-08-01 LAB — CBC WITH DIFFERENTIAL/PLATELET
Abs Immature Granulocytes: 0.07 10*3/uL (ref 0.00–0.07)
Basophils Absolute: 0 10*3/uL (ref 0.0–0.1)
Basophils Relative: 0 %
Eosinophils Absolute: 0 10*3/uL (ref 0.0–0.5)
Eosinophils Relative: 0 %
HCT: 28.5 % — ABNORMAL LOW (ref 36.0–46.0)
Hemoglobin: 8.5 g/dL — ABNORMAL LOW (ref 12.0–15.0)
Immature Granulocytes: 1 %
Lymphocytes Relative: 2 %
Lymphs Abs: 0.2 10*3/uL — ABNORMAL LOW (ref 0.7–4.0)
MCH: 28.7 pg (ref 26.0–34.0)
MCHC: 29.8 g/dL — ABNORMAL LOW (ref 30.0–36.0)
MCV: 96.3 fL (ref 80.0–100.0)
Monocytes Absolute: 0.1 10*3/uL (ref 0.1–1.0)
Monocytes Relative: 1 %
Neutro Abs: 8.8 10*3/uL — ABNORMAL HIGH (ref 1.7–7.7)
Neutrophils Relative %: 96 %
Platelets: 125 10*3/uL — ABNORMAL LOW (ref 150–400)
RBC: 2.96 MIL/uL — ABNORMAL LOW (ref 3.87–5.11)
RDW: 17.2 % — ABNORMAL HIGH (ref 11.5–15.5)
WBC: 9.1 10*3/uL (ref 4.0–10.5)
nRBC: 0.2 % (ref 0.0–0.2)

## 2019-08-01 LAB — GLUCOSE, CAPILLARY
Glucose-Capillary: 103 mg/dL — ABNORMAL HIGH (ref 70–99)
Glucose-Capillary: 153 mg/dL — ABNORMAL HIGH (ref 70–99)
Glucose-Capillary: 205 mg/dL — ABNORMAL HIGH (ref 70–99)
Glucose-Capillary: 38 mg/dL — CL (ref 70–99)
Glucose-Capillary: 41 mg/dL — CL (ref 70–99)
Glucose-Capillary: 61 mg/dL — ABNORMAL LOW (ref 70–99)
Glucose-Capillary: 67 mg/dL — ABNORMAL LOW (ref 70–99)
Glucose-Capillary: 81 mg/dL (ref 70–99)
Glucose-Capillary: 87 mg/dL (ref 70–99)

## 2019-08-01 LAB — POCT I-STAT EG7
Acid-Base Excess: 15 mmol/L — ABNORMAL HIGH (ref 0.0–2.0)
Bicarbonate: 42.1 mmol/L — ABNORMAL HIGH (ref 20.0–28.0)
Calcium, Ion: 1.04 mmol/L — ABNORMAL LOW (ref 1.15–1.40)
HCT: 29 % — ABNORMAL LOW (ref 36.0–46.0)
Hemoglobin: 9.9 g/dL — ABNORMAL LOW (ref 12.0–15.0)
O2 Saturation: 61 %
Patient temperature: 36.2
Potassium: 4 mmol/L (ref 3.5–5.1)
Sodium: 145 mmol/L (ref 135–145)
TCO2: 44 mmol/L — ABNORMAL HIGH (ref 22–32)
pCO2, Ven: 64.9 mmHg — ABNORMAL HIGH (ref 44.0–60.0)
pH, Ven: 7.417 (ref 7.250–7.430)
pO2, Ven: 31 mmHg — CL (ref 32.0–45.0)

## 2019-08-01 LAB — LACTIC ACID, PLASMA
Lactic Acid, Venous: 9.8 mmol/L (ref 0.5–1.9)
Lactic Acid, Venous: 1.9 mmol/L (ref 0.5–1.9)

## 2019-08-01 LAB — COMPREHENSIVE METABOLIC PANEL
ALT: 561 U/L — ABNORMAL HIGH (ref 0–44)
AST: 1044 U/L — ABNORMAL HIGH (ref 15–41)
Albumin: 2 g/dL — ABNORMAL LOW (ref 3.5–5.0)
Alkaline Phosphatase: 79 U/L (ref 38–126)
Anion gap: 24 — ABNORMAL HIGH (ref 5–15)
BUN: 50 mg/dL — ABNORMAL HIGH (ref 8–23)
CO2: 34 mmol/L — ABNORMAL HIGH (ref 22–32)
Calcium: 8.5 mg/dL — ABNORMAL LOW (ref 8.9–10.3)
Chloride: 91 mmol/L — ABNORMAL LOW (ref 98–111)
Creatinine, Ser: 6.34 mg/dL — ABNORMAL HIGH (ref 0.44–1.00)
GFR calc Af Amer: 7 mL/min — ABNORMAL LOW (ref >60)
GFR calc non Af Amer: 6 mL/min — ABNORMAL LOW (ref >60)
Glucose, Bld: 297 mg/dL — ABNORMAL HIGH (ref 70–99)
Potassium: 3.9 mmol/L (ref 3.5–5.1)
Sodium: 149 mmol/L — ABNORMAL HIGH (ref 135–145)
Total Bilirubin: 1.6 mg/dL — ABNORMAL HIGH (ref 0.3–1.2)
Total Protein: 4.5 g/dL — ABNORMAL LOW (ref 6.5–8.1)

## 2019-08-01 LAB — POCT I-STAT 7, (LYTES, BLD GAS, ICA,H+H)
Acid-Base Excess: 20 mmol/L — ABNORMAL HIGH (ref 0.0–2.0)
Bicarbonate: 44.8 mmol/L — ABNORMAL HIGH (ref 20.0–28.0)
Calcium, Ion: 1.04 mmol/L — ABNORMAL LOW (ref 1.15–1.40)
HCT: 26 % — ABNORMAL LOW (ref 36.0–46.0)
Hemoglobin: 8.8 g/dL — ABNORMAL LOW (ref 12.0–15.0)
O2 Saturation: 99 %
Patient temperature: 36.1
Potassium: 3.5 mmol/L (ref 3.5–5.1)
Sodium: 144 mmol/L (ref 135–145)
TCO2: 46 mmol/L — ABNORMAL HIGH (ref 22–32)
pCO2 arterial: 51.1 mmHg — ABNORMAL HIGH (ref 32.0–48.0)
pH, Arterial: 7.548 — ABNORMAL HIGH (ref 7.350–7.450)
pO2, Arterial: 141 mmHg — ABNORMAL HIGH (ref 83.0–108.0)

## 2019-08-01 LAB — CBC
HCT: 26.7 % — ABNORMAL LOW (ref 36.0–46.0)
Hemoglobin: 8 g/dL — ABNORMAL LOW (ref 12.0–15.0)
MCH: 28.5 pg (ref 26.0–34.0)
MCHC: 30 g/dL (ref 30.0–36.0)
MCV: 95 fL (ref 80.0–100.0)
Platelets: 113 10*3/uL — ABNORMAL LOW (ref 150–400)
RBC: 2.81 MIL/uL — ABNORMAL LOW (ref 3.87–5.11)
RDW: 17.2 % — ABNORMAL HIGH (ref 11.5–15.5)
WBC: 13.4 10*3/uL — ABNORMAL HIGH (ref 4.0–10.5)
nRBC: 0.1 % (ref 0.0–0.2)

## 2019-08-01 LAB — SARS CORONAVIRUS 2 BY RT PCR (HOSPITAL ORDER, PERFORMED IN ~~LOC~~ HOSPITAL LAB): SARS Coronavirus 2: NEGATIVE

## 2019-08-01 LAB — RENAL FUNCTION PANEL
Albumin: 1.9 g/dL — ABNORMAL LOW (ref 3.5–5.0)
Anion gap: 9 (ref 5–15)
BUN: 11 mg/dL (ref 8–23)
CO2: 30 mmol/L (ref 22–32)
Calcium: 6.9 mg/dL — ABNORMAL LOW (ref 8.9–10.3)
Chloride: 99 mmol/L (ref 98–111)
Creatinine, Ser: 1.72 mg/dL — ABNORMAL HIGH (ref 0.44–1.00)
GFR calc Af Amer: 35 mL/min — ABNORMAL LOW (ref >60)
GFR calc non Af Amer: 30 mL/min — ABNORMAL LOW (ref >60)
Glucose, Bld: 64 mg/dL — ABNORMAL LOW (ref 70–99)
Phosphorus: 2.5 mg/dL (ref 2.5–4.6)
Potassium: 3.5 mmol/L (ref 3.5–5.1)
Sodium: 138 mmol/L (ref 135–145)

## 2019-08-01 LAB — TYPE AND SCREEN
ABO/RH(D): A POS
Antibody Screen: NEGATIVE

## 2019-08-01 LAB — MRSA PCR SCREENING: MRSA by PCR: NEGATIVE

## 2019-08-01 LAB — BRAIN NATRIURETIC PEPTIDE: B Natriuretic Peptide: >4500 pg/mL — ABNORMAL HIGH (ref 0.0–100.0)

## 2019-08-01 LAB — PROTIME-INR
INR: 1.8 — ABNORMAL HIGH (ref 0.8–1.2)
Prothrombin Time: 20.3 s — ABNORMAL HIGH (ref 11.4–15.2)

## 2019-08-01 LAB — PROCALCITONIN: Procalcitonin: 0.68 ng/mL

## 2019-08-01 LAB — ECHOCARDIOGRAM COMPLETE

## 2019-08-01 LAB — PHOSPHORUS: Phosphorus: 8.9 mg/dL — ABNORMAL HIGH (ref 2.5–4.6)

## 2019-08-01 LAB — MAGNESIUM: Magnesium: 3.3 mg/dL — ABNORMAL HIGH (ref 1.7–2.4)

## 2019-08-01 MED ORDER — LIDOCAINE-PRILOCAINE 2.5-2.5 % EX CREA
1.0000 "application " | TOPICAL_CREAM | CUTANEOUS | Status: DC | PRN
  Filled 2019-08-01: qty 5

## 2019-08-01 MED ORDER — SODIUM CHLORIDE 0.9% FLUSH: 10.0000 mL | INTRAVENOUS | Status: DC | PRN

## 2019-08-01 MED ORDER — ORAL CARE MOUTH RINSE
15.0000 mL | OROMUCOSAL | Status: DC
  Administered 2019-08-01 – 2019-08-03 (×18): 15 mL via OROMUCOSAL

## 2019-08-01 MED ORDER — SODIUM CHLORIDE 0.9% FLUSH
10.0000 mL | Freq: Two times a day (BID) | INTRAVENOUS | Status: DC
  Administered 2019-08-02 – 2019-08-04 (×4): 10 mL
  Administered 2019-08-04 – 2019-08-05 (×2): 30 mL
  Administered 2019-08-05: 10 mL
  Administered 2019-08-06 (×2): 30 mL
  Administered 2019-08-07 – 2019-08-09 (×6): 10 mL
  Administered 2019-08-10: 30 mL
  Administered 2019-08-11: 10 mL

## 2019-08-01 MED ORDER — ACETAMINOPHEN 325 MG PO TABS
650.0000 mg | ORAL_TABLET | ORAL | Status: DC | PRN
  Administered 2019-08-03 – 2019-08-11 (×3): 650 mg via ORAL
  Filled 2019-08-01 (×2): qty 2

## 2019-08-01 MED ORDER — ONDANSETRON HCL 4 MG/2ML IJ SOLN
4.0000 mg | Freq: Four times a day (QID) | INTRAMUSCULAR | Status: DC | PRN
  Administered 2019-08-04: 4 mg via INTRAVENOUS
  Filled 2019-08-01: qty 2

## 2019-08-01 MED ORDER — LIDOCAINE HCL (PF) 1 % IJ SOLN: 5.0000 mL | INTRAMUSCULAR | Status: DC | PRN

## 2019-08-01 MED ORDER — PANTOPRAZOLE SODIUM 40 MG IV SOLR
40.0000 mg | Freq: Every day | INTRAVENOUS | Status: DC
  Administered 2019-08-01 – 2019-08-04 (×4): 40 mg via INTRAVENOUS
  Filled 2019-08-01 (×4): qty 40

## 2019-08-01 MED ORDER — DEXTROSE 10 % IV SOLN: INTRAVENOUS | Status: AC

## 2019-08-01 MED ORDER — "THROMBI-PAD 3""X3"" EX PADS"
1.0000 | MEDICATED_PAD | Freq: Once | CUTANEOUS | Status: DC
  Filled 2019-08-01: qty 1

## 2019-08-01 MED ORDER — DOCUSATE SODIUM 100 MG PO CAPS
100.0000 mg | ORAL_CAPSULE | Freq: Two times a day (BID) | ORAL | Status: DC | PRN
  Administered 2019-08-06 – 2019-08-09 (×2): 100 mg via ORAL
  Filled 2019-08-01 (×2): qty 1

## 2019-08-01 MED ORDER — SODIUM CHLORIDE 0.9 % IV SOLN: 100.0000 mL | INTRAVENOUS | Status: DC | PRN

## 2019-08-01 MED ORDER — BUDESONIDE 0.5 MG/2ML IN SUSP
0.5000 mg | Freq: Two times a day (BID) | RESPIRATORY_TRACT | Status: DC
  Administered 2019-08-01 – 2019-08-11 (×19): 0.5 mg via RESPIRATORY_TRACT
  Filled 2019-08-01 (×19): qty 2

## 2019-08-01 MED ORDER — DEXTROSE 50 % IV SOLN
12.5000 g | INTRAVENOUS | Status: AC
  Administered 2019-08-01: 12.5 g via INTRAVENOUS

## 2019-08-01 MED ORDER — DEXTROSE 50 % IV SOLN
INTRAVENOUS | Status: AC
  Filled 2019-08-01: qty 50

## 2019-08-01 MED ORDER — PENTAFLUOROPROP-TETRAFLUOROETH EX AERO: 1.0000 "application " | INHALATION_SPRAY | CUTANEOUS | Status: DC | PRN

## 2019-08-01 MED ORDER — ALBUTEROL SULFATE (2.5 MG/3ML) 0.083% IN NEBU
2.5000 mg | INHALATION_SOLUTION | RESPIRATORY_TRACT | Status: DC | PRN
  Administered 2019-08-10 (×2): 2.5 mg via RESPIRATORY_TRACT
  Filled 2019-08-01 (×2): qty 3

## 2019-08-01 MED ORDER — HEPARIN SODIUM (PORCINE) 1000 UNIT/ML DIALYSIS: 1000.0000 [IU] | INTRAMUSCULAR | Status: DC | PRN

## 2019-08-01 MED ORDER — CHLORHEXIDINE GLUCONATE CLOTH 2 % EX PADS
6.0000 | MEDICATED_PAD | Freq: Every day | CUTANEOUS | Status: DC
  Administered 2019-08-02 – 2019-08-04 (×3): 6 via TOPICAL

## 2019-08-01 MED ORDER — FENTANYL CITRATE (PF) 100 MCG/2ML IJ SOLN: 25.0000 ug | Freq: Once | INTRAMUSCULAR | Status: DC

## 2019-08-01 MED ORDER — ARFORMOTEROL TARTRATE 15 MCG/2ML IN NEBU
15.0000 ug | INHALATION_SOLUTION | Freq: Two times a day (BID) | RESPIRATORY_TRACT | Status: DC
  Administered 2019-08-01 – 2019-08-12 (×21): 15 ug via RESPIRATORY_TRACT
  Filled 2019-08-01 (×21): qty 2

## 2019-08-01 MED ORDER — FENTANYL 2500MCG IN NS 250ML (10MCG/ML) PREMIX INFUSION
25.0000 ug/h | INTRAVENOUS | Status: DC
  Administered 2019-08-01: 150 ug/h via INTRAVENOUS
  Administered 2019-08-02: 75 ug/h via INTRAVENOUS
  Filled 2019-08-01: qty 250

## 2019-08-01 MED ORDER — LACTATED RINGERS IV SOLN: INTRAVENOUS | Status: DC

## 2019-08-01 MED ORDER — INSULIN ASPART 100 UNIT/ML ~~LOC~~ SOLN
0.0000 [IU] | SUBCUTANEOUS | Status: DC
  Administered 2019-08-01: 2 [IU] via SUBCUTANEOUS
  Administered 2019-08-02 – 2019-08-04 (×8): 1 [IU] via SUBCUTANEOUS
  Administered 2019-08-06: 2 [IU] via SUBCUTANEOUS

## 2019-08-01 MED ORDER — POLYETHYLENE GLYCOL 3350 17 G PO PACK: 17.0000 g | PACK | Freq: Every day | ORAL | Status: DC | PRN

## 2019-08-01 MED ORDER — DEXTROSE 50 % IV SOLN
12.5000 g | INTRAVENOUS | Status: AC
  Administered 2019-08-01: 12.5 g via INTRAVENOUS
  Filled 2019-08-01: qty 50

## 2019-08-01 MED ORDER — DEXTROSE 50 % IV SOLN
INTRAVENOUS | Status: AC
  Administered 2019-08-01: 50 mL
  Filled 2019-08-01: qty 50

## 2019-08-01 MED ORDER — ALTEPLASE 2 MG IJ SOLR: 2.0000 mg | Freq: Once | INTRAMUSCULAR | Status: DC | PRN

## 2019-08-01 MED ORDER — DEXTROSE 50 % IV SOLN
25.0000 g | INTRAVENOUS | Status: AC
  Administered 2019-08-01: 25 g via INTRAVENOUS

## 2019-08-01 MED ORDER — CHLORHEXIDINE GLUCONATE 0.12% ORAL RINSE (MEDLINE KIT)
15.0000 mL | Freq: Two times a day (BID) | OROMUCOSAL | Status: DC
  Administered 2019-08-01 – 2019-08-12 (×12): 15 mL via OROMUCOSAL

## 2019-08-01 MED ORDER — DEXTROSE 50 % IV SOLN: 25.0000 g | INTRAVENOUS | Status: AC

## 2019-08-01 MED ORDER — DOCUSATE SODIUM 50 MG/5ML PO LIQD
100.0000 mg | Freq: Two times a day (BID) | ORAL | Status: DC
  Administered 2019-08-02 – 2019-08-03 (×3): 100 mg via ORAL
  Filled 2019-08-01 (×4): qty 10

## 2019-08-01 MED ORDER — FENTANYL BOLUS VIA INFUSION
25.0000 ug | INTRAVENOUS | Status: DC | PRN
  Filled 2019-08-01: qty 25

## 2019-08-01 MED ORDER — HEPARIN SODIUM (PORCINE) 5000 UNIT/ML IJ SOLN: 5000.0000 [IU] | Freq: Three times a day (TID) | INTRAMUSCULAR | Status: DC

## 2019-08-01 MED ORDER — PRAVASTATIN SODIUM 40 MG PO TABS: 80.0000 mg | ORAL_TABLET | Freq: Every evening | ORAL | Status: DC

## 2019-08-01 MED ORDER — POLYETHYLENE GLYCOL 3350 17 G PO PACK
17.0000 g | PACK | Freq: Every day | ORAL | Status: DC
  Administered 2019-08-02 – 2019-08-03 (×2): 17 g via ORAL
  Filled 2019-08-01 (×2): qty 1

## 2019-08-01 MED ORDER — HYDRALAZINE HCL 20 MG/ML IJ SOLN
10.0000 mg | INTRAMUSCULAR | Status: DC | PRN
  Administered 2019-08-03 – 2019-08-04 (×2): 20 mg via INTRAVENOUS
  Filled 2019-08-01: qty 1
  Filled 2019-08-01: qty 2

## 2019-08-01 NOTE — Progress Notes (Signed)
eLink Physician-Brief Progress Note Patient Name: Christina [name] DOB: March 07, 1950 MRN: [id number]   Date of Service  08/01/2019  HPI/Events of Note  Hypoglycemia - Blood glucose = 67.   eICU Interventions  Plan: 1. Increase D10W IV infusion to 60 mL/hour.     Intervention Category Major Interventions: Other:  Lysle Dingwall 08/01/2019, 10:51 PM

## 2019-08-01 NOTE — Progress Notes (Signed)
  Echocardiogram 2D Echocardiogram has been performed.  Christina [name] 08/01/2019, 1:50 PM

## 2019-08-01 NOTE — Progress Notes (Signed)
eLink Physician-Brief Progress Note Patient Name: Christina [name] DOB: 01-Sep-1950 MRN: [id number]   Date of Service  08/01/2019  HPI/Events of Note  Hypoglycemia - Blood glucose = 38. Give D50 by  eICU Interventions  Plan: 1. Decrease LR to 30 mL/hour.  2. D10W to run IV at 40 mL/hour.      Intervention Category Major Interventions: Other:  Lysle Dingwall 08/01/2019, 9:16 PM

## 2019-08-01 NOTE — Progress Notes (Signed)
eLink Physician-Brief Progress Note Patient Name: Christina [name] DOB: [birth year] MRN: [id number]   Date of Service  08/01/2019  HPI/Events of Note  Oozing from femoral A-line site.   eICU Interventions  Thrombi-pad to femoral A-line site.      Intervention Category Major Interventions: Other:  Lysle Dingwall 08/01/2019, 8:41 PM

## 2019-08-01 NOTE — Progress Notes (Signed)
CRITICAL VALUE ALERT  Critical Value:  Lactic Acid 9.8  Date & Time Notied:  08/01/19 1345  Provider Notified: Dr. Valeta Harms  Orders Received/Actions taken: none

## 2019-08-01 NOTE — Progress Notes (Signed)
Transported patient to CT and back to the ICU with no complications.  Patient tolerated trip well.

## 2019-08-01 NOTE — Consult Note (Addendum)
[facility]: Nephrology consult note: Reason for Consult: To manage dialysis and dialysis related needs Referring Physician: Dr June Leap  HPI: Christina [name] is an 69 y.o. female with history of hypertension, CAD status post stent, A. fib, anxiety depression, anemia, SBO, mesenteric ischemia status post SMA stenting, multiple recent admissions, ESRD on HD MWF at [facility] transferred from [facility] after cardiac arrest seen as a consultation for the management of ESRD. Patient was last admitted in 06/2019 for GI bleed and anemia required multiple blood transfusion.  As outpatient she has been getting Monday Wednesday Friday treatment but most of her treatments are incomplete and she was not able to meet dry weight.  Decline in her health recently. Reportedly, patient went to [facility] after she had a fall and with generalized weakness.  While in the ER, she was noted to have V. tach and she was cardioverted.  The serum potassium was found to be more than 7.  After cardioversion she became asystolic and required CPR for about 10 minutes before obtaining ROSC.  She was intubated and started on sedation.  The hyperkalemia was medically treated in ER. Her last dialysis was on 07/29/2019 for 3 hours 22 minutes with 2.5 L UF.  Currently patient is sedated, intubated and not responding.  Unable to obtain review of system.  OP Orders: Dialyzes at [place] 40 kg, 180 NR, 4 hours HD Bath 2k 2 ca, Access: LUE AVF, no heparin Mircera 200 mch 6/30 and venofer 100 mg 3x/week. Calcitriol 0.75 mcg 3x/week   Past Medical History:  Diagnosis Date  . Anemia   . Anxiety   . Arthritis   . Asthma   . Atrial fibrillation ([place])   . Chronic kidney disease    Dialysis T/Th/Sa  started in March 2020  . COPD (chronic obstructive pulmonary disease) ([place])   . Coronary artery disease    2 stents  . Depression   . GERD (gastroesophageal reflux disease)   .  GI bleeding 06/15/2019  . Gout   . Headache    migraines  . History of kidney stones   . Hyperlipidemia   . Hypertension   . Pneumonia   . Small bowel obstruction [facility]     Past Surgical History:  Procedure Laterality Date  . ABDOMINAL AORTOGRAM W/LOWER EXTREMITY Bilateral 07/08/2019   Procedure: ABDOMINAL AORTOGRAM W/LOWER EXTREMITY;  Surgeon: Serafina Mitchell, MD;  Location: [place] CV LAB;  Service: Vascular;  Laterality: Bilateral;  . ABDOMINAL HYSTERECTOMY    . ABDOMINAL SURGERY     for small bowel obstruction - x 2  . APPENDECTOMY    . AV FISTULA PLACEMENT Left 08/04/2018   Procedure: ARTERIOVENOUS (AV) FISTULA CREATION LEFT ARM;  Surgeon: Waynetta Sandy, MD;  Location: [place];  Service: Vascular;  Laterality: Left;  . BASCILIC VEIN TRANSPOSITION Left 11/24/2018   Procedure: SECOND STAGE BASILIC VEIN TRANSPOSITION LEFT ARM;  Surgeon: Waynetta Sandy, MD;  Location: [place];  Service: Vascular;  Laterality: Left;  . BIOPSY  03/26/2019   Procedure: BIOPSY;  Surgeon: Lavena Bullion, DO;  Location: [facility] ENDOSCOPY;  Service: Gastroenterology;;  . BIOPSY  07/05/2019   Procedure: BIOPSY;  Surgeon: Ladene Artist, MD;  Location: [facility] ENDOSCOPY;  Service: Endoscopy;;  . CARDIAC CATHETERIZATION    . CORONARY ANGIOPLASTY  ?2003/2004  . ESOPHAGOGASTRODUODENOSCOPY N/A 06/16/2019   Procedure: ESOPHAGOGASTRODUODENOSCOPY (EGD);  Surgeon: [name], MD;  Location: [facility] ENDOSCOPY;  Service: Endoscopy;  Laterality: N/A;  . ESOPHAGOGASTRODUODENOSCOPY (EGD) WITH PROPOFOL N/A 03/26/2019   Procedure: ESOPHAGOGASTRODUODENOSCOPY (EGD) WITH PROPOFOL;  Surgeon: Lavena Bullion, DO;  Location: [place];  Service: Gastroenterology;  Laterality: N/A;  . ESOPHAGOGASTRODUODENOSCOPY (EGD) WITH PROPOFOL N/A 07/05/2019   Procedure: ESOPHAGOGASTRODUODENOSCOPY (EGD) WITH PROPOFOL;  Surgeon: Ladene Artist, MD;  Location: [facility] ENDOSCOPY;  Service: Endoscopy;  Laterality: N/A;  . FACIAL  RECONSTRUCTION SURGERY     x 2  . HERNIA REPAIR    . INTRAVASCULAR LITHOTRIPSY Right 07/12/2019   Procedure: INTRAVASCULAR LITHOTRIPSY;  Surgeon: Serafina Mitchell, MD;  Location: [place] CV LAB;  Service: Cardiovascular;  Laterality: Right;  sfa  . IR REMOVAL TUN CV CATH W/O FL  07/14/2019  . PERIPHERAL VASCULAR BALLOON ANGIOPLASTY Right 07/12/2019   Procedure: PERIPHERAL VASCULAR BALLOON ANGIOPLASTY;  Surgeon: Serafina Mitchell, MD;  Location: [place] CV LAB;  Service: Cardiovascular;  Laterality: Right;  sfa  . PERIPHERAL VASCULAR INTERVENTION  04/27/2019   Procedure: PERIPHERAL VASCULAR INTERVENTION;  Surgeon: Marty Heck, MD;  Location: [place] CV LAB;  Service: Cardiovascular;;  SMA  . PERIPHERAL VASCULAR INTERVENTION Left 07/08/2019   Procedure: PERIPHERAL VASCULAR INTERVENTION;  Surgeon: Serafina Mitchell, MD;  Location: [place] CV LAB;  Service: Vascular;  Laterality: Left;  left common and external iliac  . VISCERAL ANGIOGRAPHY N/A 04/27/2019   Procedure: MESENTERIC ANGIOGRAPHY;  Surgeon: Marty Heck, MD;  Location: [place] CV LAB;  Service: Cardiovascular;  Laterality: N/A;    Family History  Problem Relation Age of Onset  . Ulcers Sister        sick a long time, improved after surgery    Social History:  reports that she quit smoking about 2 years ago. She has a 25.00 pack-year smoking history. She has never used smokeless tobacco. She reports previous alcohol use. She reports previous drug use. Drug: Marijuana.  Allergies:  Allergies  Allergen Reactions  . Betaine Anaphylaxis  . Cyclobenzaprine Anaphylaxis and Other (See Comments)    "stopped heart"   . Morphine Anaphylaxis    "stopped heart"  . Penicillins Shortness Of Breath, Swelling and Palpitations    Did it involve swelling of the face/tongue/throat, SOB, or low BP? Yes Did it involve sudden or severe rash/hives, skin peeling, or any reaction on the inside of your mouth or nose?  No Did you need to seek medical attention at a hospital or doctor's office? No When did it last happen?years  If all above answers are "NO", may proceed with cephalosporin use.   . Ambien [Zolpidem] Other (See Comments)    Severe confusion  . Codeine Itching and Rash  . Hydromorphone Other (See Comments)    If administered quickly, felt like hand was "exploding"     Medications: I have reviewed the patient's current medications.   No results found for this or any previous visit (from the past 48 hour(s)).  No results found.  ROS: Unable to obtain review of system as patient is sedated and intubated. Blood pressure (!) 153/69, pulse (!) 51, temperature (!) 97 F (36.1 C), resp. rate (!) 8, SpO2 100 %. General: Critically ill looking female intubated and sedated Respiratory: Coarse breath sound bilateral, no wheezing Cardiovascular: Regular rate rhythm, S1-S2 normal GI: Abdomen soft, nondistended Skin: No rash or ulcer Neurology: Sedated and unresponsive Musculoskeletal: No cyanosis, no edema Vascular Access: Left upper extremity AV fistula has good thrill and bruit  Assessment/Plan: # Cardiac arrest/V. tach: Probably contributed by hyperkalemia.  She  received medical treatment for hyperkalemia in ER.  The labs are pending.  Plan for urgent dialysis today.  Further plan including head CT, echo, blood cultures etc. per primary team.  # ESRD: MWF: Plan for HD today with low potassium bath.  Blood pressure is acceptable, not on pressors.  We will attempt some UF during HD.  She has AV fistula for the access.  # Hypertension: Systolic blood pressure in 150s.  UF during HD.  Continue monitor.  # Anemia of ESRD: Monitor hemoglobin.  Recently got Mircera and iron.  # Metabolic Bone Disease: Follow-up lab results.  #Disposition: Poor adherence with outpatient dialysis.  In the past he used to be DNR.  Recommend palliative care consult.   Ezrah Panning Tanna Furry 08/01/2019, 12:54  PM

## 2019-08-01 NOTE — H&P (Addendum)
NAME:  Christina [name], MRN:  [id number], DOB:  1950/07/31, LOS: 0 ADMISSION DATE:  08/01/2019, CONSULTATION DATE:  08/01/19 REFERRING MD:  [facility], CHIEF COMPLAINT:  Cardiac arrest    Brief History   69 y/o F who admitted 7/5 as transfer from [place] post asystolic arrest with 10 minute downtime.   History of present illness   69 y/o F who presented to [facility] on 7/5.  While in ER, she was noted to have VT with a pulse and was cardioverted. Noted to be hyperkalemic with K >7.  After cardioversion, she became asystolic with 10 minutes of CPR before ROSC.  Post CPR, the patient was reportedly awake/ pulling at ETT and was sedated. R IJ line placement attempted but unable to be placed.  Right femoral TLC placed, oozing on arrival. She was treated with bicarbonate, calcium, dextrose and insulin. Of note, her last admissions to [facility] she was DNR (since 03/2019).    Outside hospital labs - WBC 10.1, Hgb 8.6, platelet 107, PT 16.3, INR 1.6, Na 140, K 6.9, CL 87, glucose 399 (? If drawn from dextrose line as CBG's in 40's initially), BUN 50, Cr 5.30, albumin 2.2, AST 368, ALT 119.  COVID testing pending.   The patient was transferred to [facility] for further evaluation.   Past Medical History  HTN HLD CAD - s/p 2 stents CEA on Left SMA Stent - May 2021  AF CKD / ESRD - on HD, T/Th/S, started in 03/2018 ETOH Abuse COPD GIB GERD Non-bleeding Duodenal Ulcers - seen on EGD in June 2021 [facility]   7/05 Admit from [place]:  Nephrology  Procedures:  ETT 7/5 >>  R Fem TLC 7/5 >>   Significant Diagnostic Tests:  CT Head 7/5 >> trace acute subdural hematoma along the falx, no other acute intracranial findings.  No CT evidence of anoxic injury  Micro Data:  BCx2 7/5 >>  Tracheal aspirate 7/5 >>  COVID 7/5 >> negative   Antimicrobials:    Interim history/subjective:  As above  Objective   Blood pressure (!) 153/69,  pulse (!) 51, temperature (!) 97 F (36.1 C), resp. rate (!) 8, SpO2 100 %.    Vent Mode: PRVC FiO2 (%):  [50 %] 50 % Set Rate:  [16 bmp-20 bmp] 16 bmp Vt Set:  [320 mL] 320 mL PEEP:  [5 cmH20] 5 cmH20 Plateau Pressure:  [15 cmH20] 15 cmH20  No intake or output data in the 24 hours ending 08/01/19 1338 There were no vitals filed for this visit.  Examination: General: frail elderly lady on vent, critically ill appearing  HEENT: MM pink/moist, ETT, pupils 48mm sluggishly reactive Neuro: sedate CV: s1s2 rrr, SB on monitor, no m/r/g PULM: non-labored on vent, prolonged exp phase, lungs bilaterally coarse with occasional rhonchi GI: soft, bsx4 active  Extremities: warm/dry, no edema, LUE AVF with + thrill/bruit Skin: no rashes or lesions, R groin with oozing central line, confirmed venous placement with lab draw/ ABG  Resolved Hospital Problem list      Assessment & Plan:   Asystolic Cardiac Arrest  VT  Hx CAD s/p Stent, PVD, AF Suspect in setting of hyperkalemia, ESRD.  ? If missed HD.  -ICU monitoring  -reportedly woke post arrest, purposeful movements  -normothermia goal  -SBP goal 120-140 -PRN hydralazine for BP goal -assess STAT labs now  -hold ASA, plavix in setting of SDH -hold home lopressor, imdur, amiodarone 7/5  Acute Hypoxic Respiratory Failure s/p Arrest  COPD -PRVC 8cc/kg -wean PEEP / fiO2 for sats 88-95% -assess CXR now  -ABG on hour post arrival  -follow intermittent CXR -daily wean / SBT  -brovana + pulmicort BID in place of symbicort  -PRN albuterol  -assess COVID status   Hyperkalemia  ESRD on HD (T,Th,S) -Trend BMP   -LR at 50 ml/hr -Nephrology consult -Replace electrolytes as indicated -Avoid nephrotoxic agents, ensure adequate renal perfusion  Acute Metabolic Encephalopathy s/p Arrest SDH  New SDH identified on CT head post transfer -follow frequent neuro exams -SBP goal 120-140 -stop SQ heparin, SCD's only -PAD protocol with fentanyl  gtt only for now  -early PT efforts   Anemia Thrombocytopenia  Suspect element of uremic bleeding, +/ ETOH liver disease but no mention on [place] or prior CT 05/2019 -trend CBC -transfuse for Hgb <7% or active bleeding   Bleeding R Femoral TLC -additional suture added on arrival to site  -monitor closely, pressure dressing   Severe Protein Calorie Malnutrition  -TF per Nutrition   Best practice:  Diet: NPO  Pain/Anxiety/Delirium protocol (if indicated): fentanyl  VAP protocol (if indicated): In place  DVT prophylaxis: SCD's, heparin SQ  GI prophylaxis: PPI  Glucose control: CBG q4  Mobility: BR  Code Status: Full code for now, needs to be addressed with family as has been DNR in past admissions  Family Communication: [name]) called for update 7/5, no answer and unable to leave message on cell phone.  Disposition: ICU   Labs   CBC: Recent Labs  Lab 08/01/19 1211 08/01/19 1250  HGB 9.9* 8.8*  HCT 29.0* 26.0*    Basic Metabolic Panel: Recent Labs  Lab 08/01/19 1211 08/01/19 1250  NA 145 144  K 4.0 3.5   GFR: CrCl cannot be calculated (Unknown ideal weight.). Recent Labs  Lab 08/01/19 1208  LATICACIDVEN 9.8*    Liver Function Tests: No results for input(s): AST, ALT, ALKPHOS, BILITOT, PROT, ALBUMIN in the last 168 hours. No results for input(s): LIPASE, AMYLASE in the last 168 hours. No results for input(s): AMMONIA in the last 168 hours.  ABG    Component Value Date/Time   PHART 7.548 (H) 08/01/2019 1250   PCO2ART 51.1 (H) 08/01/2019 1250   PO2ART 141 (H) 08/01/2019 1250   HCO3 44.8 (H) 08/01/2019 1250   TCO2 46 (H) 08/01/2019 1250   ACIDBASEDEF 2.0 02/04/2019 0527   O2SAT 99.0 08/01/2019 1250     Coagulation Profile: No results for input(s): INR, PROTIME in the last 168 hours.  Cardiac Enzymes: No results for input(s): CKTOTAL, CKMB, CKMBINDEX, TROPONINI in the last 168 hours.  HbA1C: Hgb A1c MFr Bld  Date/Time Value Ref Range Status   06/19/2019 03:12 AM 4.9 4.8 - 5.6 % Final    Comment:    (NOTE) Pre diabetes:          5.7%-6.4% Diabetes:              >6.4% Glycemic control for   <7.0% adults with diabetes   02/04/2019 05:06 AM 5.5 4.8 - 5.6 % Final    Comment:    (NOTE) Pre diabetes:          5.7%-6.4% Diabetes:              >6.4% Glycemic control for   <7.0% adults with diabetes     CBG: No results for input(s): GLUCAP in the last 168 hours.  Review of Systems:   Unable to complete as  patient is altered on mechanical ventilation, post arrest.   Past Medical History  She,  has a past medical history of Anemia, Anxiety, Arthritis, Asthma, Atrial fibrillation ([place]), Chronic kidney disease, COPD (chronic obstructive pulmonary disease) ([place]), Coronary artery disease, Depression, GERD (gastroesophageal reflux disease), GI bleeding (06/15/2019), Gout, Headache, History of kidney stones, Hyperlipidemia, Hypertension, Pneumonia, and Small bowel obstruction ([place]).   Surgical History    Past Surgical History:  Procedure Laterality Date   ABDOMINAL AORTOGRAM W/LOWER EXTREMITY Bilateral 07/08/2019   Procedure: ABDOMINAL AORTOGRAM W/LOWER EXTREMITY;  Surgeon: Serafina Mitchell, MD;  Location: [place] CV LAB;  Service: Vascular;  Laterality: Bilateral;   ABDOMINAL HYSTERECTOMY     ABDOMINAL SURGERY     for small bowel obstruction - x 2   APPENDECTOMY     AV FISTULA PLACEMENT Left 08/04/2018   Procedure: ARTERIOVENOUS (AV) FISTULA CREATION LEFT ARM;  Surgeon: Waynetta Sandy, MD;  Location: [place];  Service: Vascular;  Laterality: Left;   [place] Left 11/24/2018   Procedure: SECOND STAGE BASILIC VEIN TRANSPOSITION LEFT ARM;  Surgeon: Waynetta Sandy, MD;  Location: [place];  Service: Vascular;  Laterality: Left;   BIOPSY  03/26/2019   Procedure: BIOPSY;  Surgeon: Lavena Bullion, DO;  Location: [place] ENDOSCOPY;  Service: Gastroenterology;;   BIOPSY  07/05/2019   Procedure:  BIOPSY;  Surgeon: Ladene Artist, MD;  Location: [place];  Service: Endoscopy;;   CARDIAC CATHETERIZATION     CORONARY ANGIOPLASTY  ?2003/2004   ESOPHAGOGASTRODUODENOSCOPY N/A 06/16/2019   Procedure: ESOPHAGOGASTRODUODENOSCOPY (EGD);  Surgeon: [name], MD;  Location: [facility] ENDOSCOPY;  Service: Endoscopy;  Laterality: N/A;   ESOPHAGOGASTRODUODENOSCOPY (EGD) WITH PROPOFOL N/A 03/26/2019   Procedure: ESOPHAGOGASTRODUODENOSCOPY (EGD) WITH PROPOFOL;  Surgeon: Lavena Bullion, DO;  Location: [place];  Service: Gastroenterology;  Laterality: N/A;   ESOPHAGOGASTRODUODENOSCOPY (EGD) WITH PROPOFOL N/A 07/05/2019   Procedure: ESOPHAGOGASTRODUODENOSCOPY (EGD) WITH PROPOFOL;  Surgeon: Ladene Artist, MD;  Location: [facility] ENDOSCOPY;  Service: Endoscopy;  Laterality: N/A;   FACIAL RECONSTRUCTION SURGERY     x 2   HERNIA REPAIR     INTRAVASCULAR LITHOTRIPSY Right 07/12/2019   Procedure: INTRAVASCULAR LITHOTRIPSY;  Surgeon: Serafina Mitchell, MD;  Location: [place] CV LAB;  Service: Cardiovascular;  Laterality: Right;  sfa   IR REMOVAL TUN CV CATH W/O FL  07/14/2019   PERIPHERAL VASCULAR BALLOON ANGIOPLASTY Right 07/12/2019   Procedure: PERIPHERAL VASCULAR BALLOON ANGIOPLASTY;  Surgeon: Serafina Mitchell, MD;  Location: [place] CV LAB;  Service: Cardiovascular;  Laterality: Right;  sfa   PERIPHERAL VASCULAR INTERVENTION  04/27/2019   Procedure: PERIPHERAL VASCULAR INTERVENTION;  Surgeon: Marty Heck, MD;  Location: [place] CV LAB;  Service: Cardiovascular;;  SMA   PERIPHERAL VASCULAR INTERVENTION Left 07/08/2019   Procedure: PERIPHERAL VASCULAR INTERVENTION;  Surgeon: Serafina Mitchell, MD;  Location: [place] CV LAB;  Service: Vascular;  Laterality: Left;  left common and external iliac   VISCERAL ANGIOGRAPHY N/A 04/27/2019   Procedure: MESENTERIC ANGIOGRAPHY;  Surgeon: Marty Heck, MD;  Location: [place] CV LAB;  Service: Cardiovascular;  Laterality:  N/A;     Social History   reports that she quit smoking about 2 years ago. She has a 25.00 pack-year smoking history. She has never used smokeless tobacco. She reports previous alcohol use. She reports previous drug use. Drug: Marijuana.   Family History   Her family history includes Ulcers in her sister.   Allergies Allergies  Allergen Reactions  Betaine Anaphylaxis   Cyclobenzaprine Anaphylaxis and Other (See Comments)    "stopped heart"    Morphine Anaphylaxis    "stopped heart"   Penicillins Shortness Of Breath, Swelling and Palpitations    Did it involve swelling of the face/tongue/throat, SOB, or low BP? Yes Did it involve sudden or severe rash/hives, skin peeling, or any reaction on the inside of your mouth or nose? No Did you need to seek medical attention at a hospital or doctor's office? No When did it last happen?years  If all above answers are NO, may proceed with cephalosporin use.    Ambien [Zolpidem] Other (See Comments)    Severe confusion   Codeine Itching and Rash   Hydromorphone Other (See Comments)    If administered quickly, felt like hand was "exploding"      Home Medications  Prior to Admission medications   Medication Sig Start Date End Date Taking? Authorizing Provider  albuterol (PROVENTIL) (2.5 MG/3ML) 0.083% nebulizer solution Take 2.5 mg by nebulization every 6 (six) hours as needed for wheezing or shortness of breath.    [provider]  allopurinol (ZYLOPRIM) 100 MG tablet Take 100 mg by mouth daily. 02/18/18   [provider]  amiodarone (PACERONE) 200 MG tablet Take 1 tablet (200 mg total) by mouth 2 (two) times daily. Patient taking differently: Take 200 mg by mouth daily.  06/20/19   Georgette Shell, MD  aspirin 81 MG EC tablet Take 81 mg by mouth daily. 05/12/19   [provider]  budesonide-formoterol (SYMBICORT) 160-4.5 MCG/ACT inhaler Inhale 2 puffs into the lungs every 4 (four) hours as needed  (shortness of breath/wheezing).    [provider]  Cholecalciferol (VITAMIN D3) 50 MCG (2000 UT) capsule Take 2,000 Units by mouth daily.     [provider]  clopidogrel (PLAVIX) 75 MG tablet Take 75 mg by mouth daily. 06/09/19   [provider]  dicyclomine (BENTYL) 10 MG capsule Take 1 capsule (10 mg total) by mouth at bedtime as needed for spasms. 07/14/19   Terrilee Croak, MD  diphenhydrAMINE (BENADRYL) 25 MG tablet Take 25 mg by mouth at bedtime as needed for sleep.     [provider]  docusate sodium (COLACE) 100 MG capsule Take 1 capsule (100 mg total) by mouth 2 (two) times daily. Patient taking differently: Take 300 mg by mouth every other day.  08/11/18   Hongalgi, Lenis Dickinson, MD  ferric citrate (AURYXIA) 1 GM 210 MG(Fe) tablet Take 630 mg by mouth 3 (three) times daily with meals. 2 tablets with a snack = 420mg     [provider]  Ipratropium-Albuterol (COMBIVENT) 20-100 MCG/ACT AERS respimat Inhale 2 puffs into the lungs 4 (four) times daily as needed for wheezing. 09/11/17   [provider]  isosorbide mononitrate (IMDUR) 30 MG 24 hr tablet Take 30 mg by mouth at bedtime.  08/17/18   [provider]  lidocaine-prilocaine (EMLA) cream Apply 1 application topically daily as needed (pain at the site of port access).  09/03/18   [provider]  metoprolol tartrate (LOPRESSOR) 50 MG tablet Take 1 tablet (50 mg total) by mouth 2 (two) times daily. 07/14/19   Dahal, Marlowe Aschoff, MD  nitroGLYCERIN (NITROSTAT) 0.4 MG SL tablet Place 0.4 mg under the tongue every 5 (five) minutes as needed for chest pain.  09/18/16   [provider]  Nutritional Supplements (FEEDING SUPPLEMENT, NEPRO CARB STEADY,) LIQD Take 237 mLs by mouth 2 (two) times daily between meals.  07/14/19   Dahal, Marlowe Aschoff, MD  ondansetron (ZOFRAN) 4 MG tablet Take 4 mg by mouth 3 (three) times daily as needed for nausea. 09/14/18   [provider]  pantoprazole  (PROTONIX) 40 MG tablet Take 1 tablet (40 mg total) by mouth 2 (two) times daily. Patient taking differently: Take 40 mg by mouth daily.  06/20/19   Georgette Shell, MD  polyethylene glycol (MIRALAX / GLYCOLAX) 17 g packet Take 17 g by mouth daily as needed for mild constipation. Patient taking differently: Take 17 g by mouth every other day. At night. 03/30/19   Mercy Riding, MD  pravastatin (PRAVACHOL) 80 MG tablet Take 80 mg by mouth every evening.    [provider]  sennosides-docusate sodium (SENOKOT-S) 8.6-50 MG tablet Take 1 tablet by mouth as needed for constipation.     [provider]     Critical care time: 42 minutes      Noe Gens, MSN, NP-C [facility] 08/01/2019, 1:38 PM   Please see [email or web address] for pager details.    PCCM Attending:   69 yo FM, pulse with VT, shocked, went to asystole, CPR with ROSC after about 36mins. Transferred from [place]. Baseline severe PVD. ESRD on iHD. K was 6.9 and treated medically  BP (!) 113/52    Pulse (!) 51    Temp (!) 97 F (36.1 C)    Resp 13    Ht 5\' 2"  (1.575 m)    Wt 47.5 kg    SpO2 100%    BMI 19.15 kg/m   Gen: eldlery fm, chronically ill appearing HENT: NCAT, tracking  Heart: brady, regular, s1 s2 Lungs: BL vented breaths  Ext: bleding from right CVC femoral site   Labs: reviewed from outside. LA 9 here   A: Cardiac Arrest Asystole  VT with pulse prior  ESRD on iHD  New Subdural hematoma (trace)  AHRF on vent 2/2 above  P: Adult vent protocol  Called nephro for iHD  Correct lytes  Recheck labs and BMP CT head with subdural, trace, plans for repeat in AM to document stability  Copd meds, scheduled nebs  Holding antiplatelets and ac with SDH Holding home bp meds  PAD guidelines for sedation  Stopped versed infusion that she transferred on   This patient is critically ill with multiple organ system failure; which, requires frequent high complexity decision making,  assessment, support, evaluation, and titration of therapies. This was completed through the application of advanced monitoring technologies and extensive interpretation of multiple databases. During this encounter critical care time was devoted to patient care services described in this note for 32 minutes.  Garner Nash, DO [facility] 08/01/2019 3:10 PM

## 2019-08-01 NOTE — TOC Initial Note (Addendum)
Transition of Care [facility]) - Initial/Assessment Note    Patient Details  Name: Christina [name] MRN: [id number] Date of Birth: 1950/07/09  Transition of Care [facility]) CM/SW Contact:    Sharin Mons, RN Phone Number: 08/01/2019, 12:13 PM  Clinical Narrative:    Pt transferred from [facility] after cardiac arrest. Pt with multi readmisssion with rectal pain, GI Bleed.           Hx of hypertension, hyperlipidemia, CAD, atrial fibrillation, COPD, ESRD on HD TTS, SBO, rectal prolapse, polysubstance abuse, multi hospital visits. Pt d/c to [place], [facility] SNF with  6/2-6/17 admission. Per admission liaison pt was set up with [facility] ( RN,PT,OT)  @ d/c 6/27, DME walker provided and discharged in the care of her daughter, [name].  [name] (Daughter)     [phone]      Resumption orders placed for Operating Room Services services.  TOC team will continue to monitor for needs.....  Expected Discharge Plan: [place] Barriers to Discharge: Continued Medical Work up   Patient Goals and CMS Choice        Expected Discharge Plan and Services Expected Discharge Plan: [place]                                              Prior Living Arrangements/Services                       Activities of Daily Living      Permission Sought/Granted                  Emotional Assessment              Admission diagnosis:  Cardiac arrest [facility]) [I46.9] Patient Active Problem List   Diagnosis Date Noted  . Cardiac arrest ([place]) 08/01/2019  . Melena   . History of duodenal ulcer   . Symptomatic anemia   . GI bleed 06/30/2019  . Cerebral embolism with cerebral infarction 06/19/2019  . Atrial fibrillation with rapid ventricular response ([place]) 06/14/2019  . Incomplete rectal prolapse 06/14/2019  . Respiratory failure with hypoxia ([place]) 05/10/2019  . Pneumonia 05/10/2019  . 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 Duodenal ulcer perforation ([place]) 04/23/2019  . Cocaine abuse ([place]) 04/23/2019  . Acute gastric ulcer without hemorrhage or perforation   . Hematemesis 04/04/2019  . Atrial fibrillation with RVR ([place])   . Duodenal ulcer with hemorrhage   . Gastritis and gastroduodenitis   . Acute blood loss anemia   . 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 RUQ pain   . Anemia due to chronic kidney disease, on chronic dialysis ([place])   . AF (paroxysmal atrial fibrillation) ([place])   . Chest pain 03/19/2019  . Hyperkalemia 02/28/2019  . Uremia 02/27/2019  . Volume overload 02/21/2019  . Abdominal pain 02/21/2019  . Non-compliance with renal dialysis ([place]) 02/21/2019  . Respiratory failure ([place]) 02/04/2019  . Acute and chr resp failure, unsp w hypoxia or hypercapnia ([place])   . Afib ([place]) 01/25/2019  . SBO (small bowel obstruction) ([place]) 08/10/2018  . Anemia due to chronic blood loss   . Heme positive stool   . Platelet inhibition due to Plavix   . Acute GI bleeding 05/06/2018  . ESRD (end stage renal disease) on dialysis ([place]) 05/06/2018  .  Anxiety 05/06/2018  . Bipolar affective ([place]) 05/06/2018  . CAD (coronary artery disease), native coronary artery 05/06/2018  . COPD with chronic bronchitis ([place]) 05/06/2018  . CVA (cerebral vascular accident) ([place]) 05/06/2018  . Essential hypertension 05/06/2018  . Hyperlipidemia 05/06/2018   PCP:  Maggie Schwalbe, PA-C Pharmacy:  No Pharmacies Listed    Social Determinants of Health (SDOH) Interventions    Readmission Risk Interventions Readmission Risk Prevention Plan 07/05/2019 06/17/2019 03/29/2019  Transportation Screening Complete Complete Complete  Medication Review Press photographer) Complete Complete Complete  PCP or Specialist appointment within 3-5 days of discharge Complete - -  HRI or Home Care Consult Complete Complete Complete  SW Recovery Care/Counseling Consult Complete Complete Complete  Palliative Care Screening Not Applicable Not  Applicable Not Applicable  Skilled Nursing Facility Complete (No Data) Not Applicable  Some recent data might be hidden

## 2019-08-02 ENCOUNTER — Inpatient Hospital Stay (HOSPITAL_COMMUNITY): Payer: Medicare Other

## 2019-08-02 ENCOUNTER — Encounter (HOSPITAL_COMMUNITY): Payer: Self-pay | Admitting: Pulmonary Disease

## 2019-08-02 DIAGNOSIS — Z91199 Patient's noncompliance with other medical treatment and regimen due to unspecified reason: Secondary | ICD-10-CM

## 2019-08-02 LAB — BASIC METABOLIC PANEL
Anion gap: 10 (ref 5–15)
BUN: 22 mg/dL (ref 8–23)
CO2: 32 mmol/L (ref 22–32)
Calcium: 7 mg/dL — ABNORMAL LOW (ref 8.9–10.3)
Chloride: 95 mmol/L — ABNORMAL LOW (ref 98–111)
Creatinine, Ser: 3.25 mg/dL — ABNORMAL HIGH (ref 0.44–1.00)
GFR calc Af Amer: 16 mL/min — ABNORMAL LOW (ref >60)
GFR calc non Af Amer: 14 mL/min — ABNORMAL LOW (ref >60)
Glucose, Bld: 316 mg/dL — ABNORMAL HIGH (ref 70–99)
Potassium: 4.3 mmol/L (ref 3.5–5.1)
Sodium: 137 mmol/L (ref 135–145)

## 2019-08-02 LAB — MAGNESIUM
Magnesium: 1.8 mg/dL (ref 1.7–2.4)
Magnesium: 1.9 mg/dL (ref 1.7–2.4)

## 2019-08-02 LAB — GLUCOSE, CAPILLARY
Glucose-Capillary: 106 mg/dL — ABNORMAL HIGH (ref 70–99)
Glucose-Capillary: 125 mg/dL — ABNORMAL HIGH (ref 70–99)
Glucose-Capillary: 131 mg/dL — ABNORMAL HIGH (ref 70–99)
Glucose-Capillary: 138 mg/dL — ABNORMAL HIGH (ref 70–99)
Glucose-Capillary: 158 mg/dL — ABNORMAL HIGH (ref 70–99)
Glucose-Capillary: 160 mg/dL — ABNORMAL HIGH (ref 70–99)
Glucose-Capillary: 163 mg/dL — ABNORMAL HIGH (ref 70–99)
Glucose-Capillary: 184 mg/dL — ABNORMAL HIGH (ref 70–99)
Glucose-Capillary: 185 mg/dL — ABNORMAL HIGH (ref 70–99)
Glucose-Capillary: 225 mg/dL — ABNORMAL HIGH (ref 70–99)
Glucose-Capillary: 39 mg/dL — CL (ref 70–99)
Glucose-Capillary: 72 mg/dL (ref 70–99)
Glucose-Capillary: 77 mg/dL (ref 70–99)
Glucose-Capillary: 93 mg/dL (ref 70–99)

## 2019-08-02 LAB — CBC
HCT: 25.1 % — ABNORMAL LOW (ref 36.0–46.0)
Hemoglobin: 7.4 g/dL — ABNORMAL LOW (ref 12.0–15.0)
MCH: 28.6 pg (ref 26.0–34.0)
MCHC: 29.5 g/dL — ABNORMAL LOW (ref 30.0–36.0)
MCV: 96.9 fL (ref 80.0–100.0)
Platelets: 102 10*3/uL — ABNORMAL LOW (ref 150–400)
RBC: 2.59 MIL/uL — ABNORMAL LOW (ref 3.87–5.11)
RDW: 17.2 % — ABNORMAL HIGH (ref 11.5–15.5)
WBC: 17.4 10*3/uL — ABNORMAL HIGH (ref 4.0–10.5)
nRBC: 0.2 % (ref 0.0–0.2)

## 2019-08-02 LAB — PHOSPHORUS
Phosphorus: 4.8 mg/dL — ABNORMAL HIGH (ref 2.5–4.6)
Phosphorus: 5.9 mg/dL — ABNORMAL HIGH (ref 2.5–4.6)

## 2019-08-02 LAB — GLUCOSE, RANDOM: Glucose, Bld: 178 mg/dL — ABNORMAL HIGH (ref 70–99)

## 2019-08-02 MED ORDER — PRO-STAT SUGAR FREE PO LIQD
30.0000 mL | Freq: Two times a day (BID) | ORAL | Status: DC
  Administered 2019-08-02 – 2019-08-03 (×3): 30 mL
  Filled 2019-08-02 (×3): qty 30

## 2019-08-02 MED ORDER — MIDAZOLAM HCL 2 MG/2ML IJ SOLN
1.0000 mg | Freq: Once | INTRAMUSCULAR | Status: AC
  Administered 2019-08-02: 1 mg via INTRAVENOUS
  Filled 2019-08-02: qty 2

## 2019-08-02 MED ORDER — DEXTROSE 50 % IV SOLN
25.0000 g | INTRAVENOUS | Status: AC
  Administered 2019-08-02: 25 g via INTRAVENOUS

## 2019-08-02 MED ORDER — DEXMEDETOMIDINE HCL IN NACL 400 MCG/100ML IV SOLN
0.4000 ug/kg/h | INTRAVENOUS | Status: DC
  Administered 2019-08-02: 0.4 ug/kg/h via INTRAVENOUS
  Filled 2019-08-02: qty 100

## 2019-08-02 MED ORDER — DEXTROSE 50 % IV SOLN
INTRAVENOUS | Status: AC
  Filled 2019-08-02: qty 50

## 2019-08-02 MED ORDER — CHLORHEXIDINE GLUCONATE CLOTH 2 % EX PADS
6.0000 | MEDICATED_PAD | Freq: Every day | CUTANEOUS | Status: DC
  Administered 2019-08-02: 6 via TOPICAL

## 2019-08-02 MED ORDER — MIDODRINE HCL 5 MG PO TABS
10.0000 mg | ORAL_TABLET | ORAL | Status: DC | PRN
  Administered 2019-08-03: 10 mg via ORAL
  Filled 2019-08-02: qty 2

## 2019-08-02 MED ORDER — FENTANYL CITRATE (PF) 100 MCG/2ML IJ SOLN
25.0000 ug | INTRAMUSCULAR | Status: DC | PRN
  Administered 2019-08-02: 50 ug via INTRAVENOUS
  Administered 2019-08-03: 25 ug via INTRAVENOUS
  Administered 2019-08-03 (×2): 50 ug via INTRAVENOUS
  Administered 2019-08-03: 25 ug via INTRAVENOUS
  Administered 2019-08-04: 50 ug via INTRAVENOUS
  Filled 2019-08-02 (×4): qty 2

## 2019-08-02 MED ORDER — VITAL HIGH PROTEIN PO LIQD
1000.0000 mL | ORAL | Status: DC
  Administered 2019-08-02: 1000 mL

## 2019-08-02 MED ORDER — PHENYLEPHRINE HCL-NACL 10-0.9 MG/250ML-% IV SOLN
0.0000 ug/min | INTRAVENOUS | Status: DC
  Administered 2019-08-02: 20 ug/min via INTRAVENOUS
  Filled 2019-08-02: qty 250

## 2019-08-02 NOTE — Progress Notes (Signed)
RT notified by RN that pt almost self extubated. Upon arrival to pt room ETT out to 19cm. Cuff deflated and ETT advanced back to 23cm. Stat CXR ordered. ETT resecured.. Pt orally suctioned for moderate amount of tan secretions. ETT suctioned for pink tinged, tan secretions. RT will continue to monitor.

## 2019-08-02 NOTE — Procedures (Signed)
Central Venous Catheter Insertion Procedure Note  Christina [name]  [id number]  1950-05-30  Date:08/02/19  Time:5:59 AM   Provider Performing:Makinsley Schiavi A Mariane Masters   Procedure: Insertion of Non-tunneled Central Venous Catheter([id number]) without US guidance  Indication(s) Medication administration and Difficult access  Consent Unable to obtain consent due to emergent nature of procedure.  Critically ill, multiple drips, no IV access  Anesthesia no anesthesia  Timeout Verified patient identification, verified procedure, site/side was marked, verified correct patient position, special equipment/implants available, medications/allergies/relevant history reviewed, required imaging and test results available.  Sterile Technique Maximal sterile technique including full sterile barrier drape, hand hygiene, sterile gown, sterile gloves, mask, hair covering, sterile ultrasound probe cover (if used).  Procedure Description Area of catheter insertion was cleaned with chlorhexidine and draped in sterile fashion.  Without real-time ultrasound guidance a central venous catheter was placed into the left subclavian vein. Nonpulsatile blood flow and easy flushing noted in all ports.  The catheter was sutured in place and sterile dressing applied.  Complications/Tolerance None; patient tolerated the procedure well. Chest X-ray is ordered to verify placement for internal jugular or subclavian cannulation.   Chest x-ray is not ordered for femoral cannulation.  EBL Minimal  Specimen(s) None

## 2019-08-02 NOTE — Progress Notes (Signed)
RT attempted SBT again with pt. Pt lasted 15 minutes with continued low MVe and low RR. Pt returned to full support at that time. RT will continue to monitor.

## 2019-08-02 NOTE — Progress Notes (Signed)
[place] Progress Note Patient Name: GWENYTH DINGEE DOB: 1950-03-22 MRN: [id number]   Date of Service  08/02/2019  HPI/Events of Note  Agitation - Nursing request for one-time sedation prior to Head CT Scan.   eICU Interventions  1. Versed 1 mg IV X 1 prior to head CT Scan.     Intervention Category Major Interventions: Delirium, psychosis, severe agitation - evaluation and management  Oden Lindaman [place] 08/02/2019, 4:12 AM

## 2019-08-02 NOTE — Progress Notes (Signed)
Patient transported from [place] to CT and back with no complications.

## 2019-08-02 NOTE — Progress Notes (Addendum)
NAME:  Christina [name], MRN:  [id number], DOB:  01-31-1950, LOS: 1 ADMISSION DATE:  08/01/2019, CONSULTATION DATE:  08/01/19 REFERRING MD:  [facility], CHIEF COMPLAINT:  Cardiac arrest    Brief History   69 y/o F who admitted 7/5 as transfer from [facility] post asystolic arrest with 10 minute downtime. Hyperkalemia thought to be source of arrest / medical non-compliance, hypoglycemic.   Past Medical History  HTN HLD CAD - s/p 2 stents CEA on Left SMA Stent - May 2021  AF CKD / ESRD - on HD, T/Th/S, started in 03/2018 ETOH Abuse COPD GIB GERD Non-bleeding Duodenal Ulcers - seen on EGD in June 2021 Anemia Medical Non-compliance  Significant Hospital Events   7/05 Admit from [facility], attempted HD > hypotension  Consults:  Nephrology  Procedures:  ETT 7/5 >>  R Fem TLC 7/5 >> 7/5 L [place] TLC 7/5 >>  Significant Diagnostic Tests:  CT Head 7/5 >> trace acute subdural hematoma along the falx, no other acute intracranial findings.  No CT evidence of anoxic injury ECHO 7/5 >> LVEF 55-60%, no RWMA, interventricular septum is mildly flattened in systole & diastole consistent with RV & volume overload, RV systolic function is mildly reduced.  Severely elevated pulmonary artery systolic pressure, estimated RV systolic pressure is 17.6.  LA mildly dilated, mild to moderate TR CT Head 7/6 >> stable trace parafalcine subdural hematoma, advanced chronic ischemic injury   Micro Data:  BCx2 7/5 >>  Tracheal aspirate 7/5 >>  COVID 7/5 >> negative   Antimicrobials:    Interim history/subjective:  Tmax 99.7  On vent - 40%, PEEP 5 RT reports low rate with PSV attempt this am.  Note pt was on fentanyl gtt until 0450 am Hypoglycemia overnight > into the 30's, ? Accuracy of peripheral checks I/O - 500 ml removed with HD, +1.4L in last 24 hours   Objective   Blood pressure (!) 138/52, pulse (!) 57, temperature 99.7 F (37.6 C), resp. rate 18, height 5\' 2"  (1.575 m),  weight 48.3 kg, SpO2 96 %.    Vent Mode: PRVC FiO2 (%):  [40 %-100 %] 40 % Set Rate:  [16 bmp-20 bmp] 16 bmp Vt Set:  [320 mL] 320 mL PEEP:  [5 cmH20] 5 cmH20 Plateau Pressure:  [13 cmH20-29 cmH20] 14 cmH20   Intake/Output Summary (Last 24 hours) at 08/02/2019 0826 Last data filed at 08/02/2019 0600 Gross per 24 hour  Intake 1222.34 ml  Output -190 ml  Net 1412.34 ml   Filed Weights   08/01/19 1200 08/01/19 1520 08/02/19 0500  Weight: 47.5 kg 49.5 kg 48.3 kg    Examination: General: frail elderly female lying in bed on vent in NAD, ill appearing  HEENT: MM pink/moist, ETT, left CEA scar / well healed. L [place] TLC c/d/i Neuro: opens eyes to voice, follows commands, pupils =/reactive  CV: s1s2 SB in 50's on monitor, no m/r/g PULM: non-labored on vent, lungs bilaterally clear  GI: soft, bsx4 active  Extremities: warm/dry, no edema. LUE AV fistula with + thrill / bruit  Skin: scattered areas of bruising, thin skin. LE's with discoloration, pulses + doppler  Resolved Hospital Problem list      Assessment & Plan:   Asystolic Cardiac Arrest  VT  Hx CAD s/p Stent, PVD, AF Suspect in setting of hyperkalemia, ESRD.   -ICU / tele monitoring  -avoid hypothermia  -SBP goal 120-140  -PRN hydralazine if needed for BP goal -may need midodrine for HD  -  hold ASA, plavix in setting of SDH -hold home lopressor, imdur, amiodarone  Acute Hypoxic Respiratory Failure s/p Arrest  COPD -PRVC 8cc/kg as rest mode  -daily SBT / WUA, repeat later this am.  Discussed with RT  -wean PEEP / FiO2 for sats 88-95% -follow intermittent CXR  -continue brovana + pulmicort BID in lieu of home symbicort  -PRN albuterol   Hyperkalemia  ESRD on HD (T,Th,S) -stop IVF's  -appreciate Nephrology, plan for HD 7/7  -trend BMP  -replace electrolytes as indicated   Acute Metabolic Encephalopathy s/p Arrest SDH  New SDH identified on CT head post transfer.  Repeat CT 7/6 without change -follow serial neuro  exams  -SBP goal 120-140  -no anticoagulation, SCD's only  -PAD protocol with PRN's only  -PT efforts   Anemia Thrombocytopenia  Suspect element of uremic bleeding, +/ ETOH liver disease but no mention on [place] or prior CT 05/2019 -trend CBC -transfuse for Hgb <7% or active bleeding   Bleeding R Femoral TLC -line removed, monitor site   Severe Protein Calorie Malnutrition  -begin TF   Hypoglycemia  Suspect peripheral checks not accurate, does not correlate with check from central line -stop D10 infusion  -begin TF as above  Best practice:  Diet: NPO / TF  Pain/Anxiety/Delirium protocol (if indicated): fentanyl  VAP protocol (if indicated): In place  DVT prophylaxis: SCD's, heparin SQ  GI prophylaxis: PPI  Glucose control: CBG q4  Mobility: BR  Code Status: DNR Family Communication: [name]) called for update 7/6, reviewed events of admission.  Discussed code status.  [name] indicates she agrees with DNR.   Disposition: ICU   Labs   CBC: Recent Labs  Lab 08/01/19 1208 08/01/19 1211 08/01/19 1250 08/01/19 1400 08/02/19 0015  WBC 9.1  --   --  13.4* 17.4*  NEUTROABS 8.8*  --   --   --   --   HGB 8.5* 9.9* 8.8* 8.0* 7.4*  HCT 28.5* 29.0* 26.0* 26.7* 25.1*  MCV 96.3  --   --  95.0 96.9  PLT 125*  --   --  113* 102*    Basic Metabolic Panel: Recent Labs  Lab 08/01/19 1208 08/01/19 1211 08/01/19 1250 08/01/19 1400 08/02/19 0015 08/02/19 0225  NA 149* 145 144 138 137  --   K 3.9 4.0 3.5 3.5 4.3  --   CL 91*  --   --  99 95*  --   CO2 34*  --   --  30 32  --   GLUCOSE 297*  --   --  64* 316* 178*  BUN 50*  --   --  11 22  --   CREATININE 6.34*  --   --  1.72* 3.25*  --   CALCIUM 8.5*  --   --  6.9* 7.0*  --   MG 3.3*  --   --   --  1.9  --   PHOS 8.9*  --   --  2.5 4.8*  --    GFR: Estimated Creatinine Clearance: 12.5 mL/min (A) (by C-G formula based on SCr of 3.25 mg/dL (H)). Recent Labs  Lab 08/01/19 1208 08/01/19 1400 08/01/19 1504  08/02/19 0015  PROCALCITON 0.68  --   --   --   WBC 9.1 13.4*  --  17.4*  LATICACIDVEN 9.8*  --  1.9  --     Liver Function Tests: Recent Labs  Lab 08/01/19 1208 08/01/19 1400  AST 1,044*  --  ALT 561*  --   ALKPHOS 79  --   BILITOT 1.6*  --   PROT 4.5*  --   ALBUMIN 2.0* 1.9*   No results for input(s): LIPASE, AMYLASE in the last 168 hours. No results for input(s): AMMONIA in the last 168 hours.  ABG    Component Value Date/Time   PHART 7.548 (H) 08/01/2019 1250   PCO2ART 51.1 (H) 08/01/2019 1250   PO2ART 141 (H) 08/01/2019 1250   HCO3 44.8 (H) 08/01/2019 1250   TCO2 46 (H) 08/01/2019 1250   ACIDBASEDEF 2.0 02/04/2019 0527   O2SAT 99.0 08/01/2019 1250     Coagulation Profile: Recent Labs  Lab 08/01/19 1208  INR 1.8*    Cardiac Enzymes: No results for input(s): CKTOTAL, CKMB, CKMBINDEX, TROPONINI in the last 168 hours.  HbA1C: Hgb A1c MFr Bld  Date/Time Value Ref Range Status  06/19/2019 03:12 AM 4.9 4.8 - 5.6 % Final    Comment:    (NOTE) Pre diabetes:          5.7%-6.4% Diabetes:              >6.4% Glycemic control for   <7.0% adults with diabetes   02/04/2019 05:06 AM 5.5 4.8 - 5.6 % Final    Comment:    (NOTE) Pre diabetes:          5.7%-6.4% Diabetes:              >6.4% Glycemic control for   <7.0% adults with diabetes     CBG: Recent Labs  Lab 08/02/19 0227 08/02/19 0351 08/02/19 0648 08/02/19 0724 08/02/19 0800  GLUCAP 184* 125* 39* 77 72    Critical care time: 35 minutes      Noe Gens, MSN, NP-C [facility] 08/02/2019, 8:26 AM   Please see [email or web address] for pager details.   PCCM Attending:  69 yo, post arrest, hyperkalemia, iHD, ESRD dependent   BP (!) 135/47   Pulse 63   Temp 99.7 F (37.6 C)   Resp 10   Ht 5\' 2"  (1.575 m)   Wt 48.3 kg   SpO2 99%   BMI 19.48 kg/m   Gen: chroncially ill appearing Fm, intubated on MV  HENT: ETT  In place  Neuro: alert to voice, opens eyes  Heart: brady  regular  Lungs: BL vented breaths   Labs reviewed   A: Asystolic arrest  Prior VT with pulse AHRF 2/2 above  COPD baseline  ESRD on iHD  ?compliance issues  SDH  P: Continue adulte vent protocol  Wean as toelrated  Family discussions today, appreciate brandi's discussion with family  SAT and SBT tomorrow ?extubate in AM PAD guide, Start precedex   This patient is critically ill with multiple organ system failure; which, requires frequent high complexity decision making, assessment, support, evaluation, and titration of therapies. This was completed through the application of advanced monitoring technologies and extensive interpretation of multiple databases. During this encounter critical care time was devoted to patient care services described in this note for 32 minutes.  [facility] 08/02/2019 6:33 PM

## 2019-08-02 NOTE — Progress Notes (Addendum)
[facility]PH[facility] PROGRESS NOTE  Assessment/ Plan: Pt is a 69 y.o. yo female  with h/o HTN, CAD s/p stent, A. fib, anxiety depression, anemia, SBO, mesenteric ischemia s/p SMA stenting, multiple recent admissions, ESRD on HD MWF at [facility] transferred from [facility] after cardiac arrest seen as a consultation for the management of ESRD.  # Cardiac arrest/V. tach: Probably contributed by hyperkalemia.  Status post dialysis with improvement of potassium level.  Echo with EF of 55 to 60%.  # ESRD: MWF: She had dialysis yesterday, did not tolerate well because of intradialytic hypotension.  Only able to to cough 500 cc.  No need for dialysis today.  We will assess tomorrow morning. She has AV fistula for the access.  #Acute respiratory failure with hypoxia: Patient is alert awake today.  # Hypertension: Blood pressure acceptable today.  May need midodrine for intradialytic hypotension. UF during HD.  Continue monitor.  # Anemia of ESRD: Monitor hemoglobin.  Recently got Mircera and iron.  Transfuse as needed.  # Metabolic Bone Disease:  Phosphorus level acceptable.  Resume binders when able to take orally.  #CT head showed subdural hematoma.  Avoid heparin.  #Disposition: Poor adherence with outpatient dialysis.  In the past he used to be DNR.  Recommend palliative care consult.   Discussed with ICU team  Subjective: Seen and examined in ICU.  She is alert awake and opening eyes with the name.  Remains intubated.  Required brief phenylephrine which is off this morning.  Did not tolerate dialysis well yesterday. Objective Vital signs in last 24 hours: Vitals:   08/02/19 0530 08/02/19 0600 08/02/19 0700 08/02/19 0800  BP:  (!) 123/56 (!) 131/52 (!) 138/52  Pulse:    (!) 57  Resp:  (!) 27 (!) 28 18  Temp:      SpO2: 100% (!) 89% 100% 96%  Weight:      Height:       Weight change:   Intake/Output Summary (Last 24 hours) at 08/02/2019  0907 Last data filed at 08/02/2019 0600 Gross per 24 hour  Intake 1222.34 ml  Output -190 ml  Net 1412.34 ml       Labs: Basic Metabolic Panel: Recent Labs  Lab 08/01/19 1208 08/01/19 1208 08/01/19 1211 08/01/19 1250 08/01/19 1400 08/02/19 0015 08/02/19 0225  NA 149*  --    < > 144 138 137  --   K 3.9  --    < > 3.5 3.5 4.3  --   CL 91*  --   --   --  99 95*  --   CO2 34*  --   --   --  30 32  --   GLUCOSE 297*   < >  --   --  64* 316* 178*  BUN 50*  --   --   --  11 22  --   CREATININE 6.34*  --   --   --  1.72* 3.25*  --   CALCIUM 8.5*  --   --   --  6.9* 7.0*  --   PHOS 8.9*  --   --   --  2.5 4.8*  --    < > = values in this interval not displayed.   Liver Function Tests: Recent Labs  Lab 08/01/19 1208 08/01/19 1400  AST 1,044*  --   ALT 561*  --   ALKPHOS 79  --   BILITOT 1.6*  --   PROT  4.5*  --   ALBUMIN 2.0* 1.9*   No results for input(s): LIPASE, AMYLASE in the last 168 hours. No results for input(s): AMMONIA in the last 168 hours. CBC: Recent Labs  Lab 08/01/19 1208 08/01/19 1211 08/01/19 1250 08/01/19 1400 08/02/19 0015  WBC 9.1  --   --  13.4* 17.4*  NEUTROABS 8.8*  --   --   --   --   HGB 8.5*   < > 8.8* 8.0* 7.4*  HCT 28.5*   < > 26.0* 26.7* 25.1*  MCV 96.3  --   --  95.0 96.9  PLT 125*  --   --  113* 102*   < > = values in this interval not displayed.   Cardiac Enzymes: No results for input(s): CKTOTAL, CKMB, CKMBINDEX, TROPONINI in the last 168 hours. CBG: Recent Labs  Lab 08/02/19 0351 08/02/19 0648 08/02/19 0724 08/02/19 0800 08/02/19 0855  GLUCAP 125* 39* 77 72 163*    Iron Studies: No results for input(s): IRON, TIBC, TRANSFERRIN, FERRITIN in the last 72 hours. Studies/Results: DG Chest 1 View  Result Date: 08/02/2019 CLINICAL DATA:  Central line placement. EXAM: CHEST  1 VIEW COMPARISON:  08/01/2019.  CT 05/22/2019. FINDINGS: Interim placement left subclavian line, its tip is at the cavoatrial junction. Endotracheal  tube and NG tube in stable position. Cardiomegaly. No pulmonary venous congestion. Mild bilateral interstitial prominence again noted. A component of these changes may be chronic however interstitial edema and/or pneumonitis can not be excluded. Tiny left pleural effusion. No pneumothorax. Surgical clips noted over the neck. IMPRESSION: 1. Interim placement of left subclavian line, its tip is at the cavoatrial junction. Endotracheal tube and NG tube in stable position. 2. Cardiomegaly. No pulmonary venous congestion. Mild bilateral interstitial prominence again noted. A component of these changes may be chronic however interstitial edema and/or pneumonitis cannot be excluded. Tiny left pleural effusion. Electronically Signed   By: Marcello Moores  Register   On: 08/02/2019 06:10   CT HEAD WO CONTRAST  Result Date: 08/02/2019 CLINICAL DATA:  Follow-up subdural hematoma EXAM: CT HEAD WITHOUT CONTRAST TECHNIQUE: Contiguous axial images were obtained from the base of the skull through the vertex without intravenous contrast. COMPARISON:  Yesterday FINDINGS: Brain: Trace high-density thickening left parafalcine at the vertex, not progressed. This is convincing for subdural hemorrhage when compared with a 06/17/2019 head CT. Extensive chronic small vessel ischemic injury with confluent gliosis in the cerebral white matter. Small remote bilateral cerebellar infarcts. Patchy remote cortical infarcts along the cerebral convexities. Chronic lacune at the right caudate head. No hydrocephalus or masslike finding Vascular: No hyperdense vessel or unexpected calcification. Skull: Normal. Negative for fracture or focal lesion. Sinuses/Orbits: No acute finding. IMPRESSION: 1. Stable trace parafalcine subdural hematoma. 2. Advanced chronic ischemic injury. Electronically Signed   By: Monte Fantasia M.D.   On: 08/02/2019 06:14   CT HEAD WO CONTRAST  Addendum Date: 08/01/2019   ADDENDUM REPORT: 08/01/2019 13:43 ADDENDUM: Study discussed  by telephone with Pulmonary/Critical Care Dr. Valeta Harms on 08/01/2019 at 1339 hours. Electronically Signed   By: Genevie Ann M.D.   On: 08/01/2019 13:43   Result Date: 08/01/2019 CLINICAL DATA:  69 year old female status post cardiac arrest. Dialysis patient. EXAM: CT HEAD WITHOUT CONTRAST TECHNIQUE: Contiguous axial images were obtained from the base of the skull through the vertex without intravenous contrast. COMPARISON:  CTA head and neck 06/19/2019. Head CT 06/17/2019 and earlier. FINDINGS: Brain: Chronic encephalomalacia in the left frontal lobe, left parietal lobe, right greater  than left occipital lobes. Superimposed confluent bilateral white matter hypodensity. Chronic hypodensity scattered in the bilateral deep gray matter nuclei. Small chronic cerebellar infarcts again noted. No evidence of acute cerebral edema. No intracranial mass effect. However, there does appear to be a small or trace volume of subdural hematoma along the falx (coronal image 35). But no other acute intracranial hemorrhage identified. Vascular: Calcified atherosclerosis at the skull base. Skull: No acute osseous abnormality identified. Sinuses/Orbits: Visualized paranasal sinuses and mastoids are stable and well pneumatized. Other: Small volume of fluid in the visible pharynx with oral endotracheal and enteric tubes visible. No acute orbit or scalp soft tissue finding. IMPRESSION: 1. Trace acute subdural hematoma along the falx. But no other acute intracranial hemorrhage and no intracranial mass effect. 2. No CT evidence of anoxic injury. Advanced chronic ischemic disease appears stable from the CTA on 06/19/2019. Electronically Signed: By: Genevie Ann M.D. On: 08/01/2019 13:28   DG Chest Port 1 View  Result Date: 08/01/2019 CLINICAL DATA:  Intubation.  Assess for pneumothorax. EXAM: PORTABLE CHEST 1 VIEW COMPARISON:  August 01, 2019 7:58 a.m. FINDINGS: Endotracheal tube is identified 5.2 cm from carina. There is no pleural line to suggest  pneumothorax. Nasogastric tube is identified distal tip not included on film but is at least in the stomach. Mild diffuse increased interstitium is identified bilaterally. Patchy opacities of both lung bases are noted. The heart size is mildly enlarged. The bony structures are stable. IMPRESSION: 1. Endotracheal tube 5.2 cm from carina. No pneumothorax. 2. Mild pulmonary edema. 3. Patchy opacities of both lung bases, superimposed pneumonia is not excluded. Electronically Signed   By: Abelardo Diesel M.D.   On: 08/01/2019 13:05   ECHOCARDIOGRAM COMPLETE  Result Date: 08/01/2019    ECHOCARDIOGRAM REPORT   Patient Name:   Christina [name] Date of Exam: 08/01/2019 Medical Rec #:  [id number]          Height:       62.0 in Accession #:    [id number]         Weight:       89.3 lb Date of Birth:  1950-10-13           BSA:          1.357 m Patient Age:    81 years           BP:           153/69 mmHg Patient Gender: F                  HR:           49 bpm. Exam Location:  Inpatient Procedure: 2D Echo, Cardiac Doppler and Color Doppler Indications:    Cardiac arrest ([place]) [427.5.ICD-9-CM]  History:        Patient has prior history of Echocardiogram examinations, most                 recent 06/20/2019. CAD, Stroke and COPD, Arrythmias:Atrial                 Fibrillation, Signs/Symptoms:Chest Pain; Risk                 Factors:Hypertension, Dyslipidemia and Former Smoker.  Sonographer:    Vickie Epley RDCS Referring Phys: [id number] GRACE E BOWSER  Sonographer Comments: Echo performed with patient supine and on artificial respirator. IMPRESSIONS  1. Left ventricular ejection fraction, by estimation, is 55 to 60%. The left ventricle has normal function. The  left ventricle has no regional wall motion abnormalities. There is the interventricular septum is mildly flattened in systole and diastole, consistent with right ventricular pressure and volume overload.  2. Right ventricular systolic function is mildly reduced. The right  ventricular size is normal. There is severely elevated pulmonary artery systolic pressure. The estimated right ventricular systolic pressure is 40.9 mmHg.  3. Left atrial size was mildly dilated.  4. The mitral valve is abnormal. Mild mitral valve regurgitation.  5. Tricuspid valve regurgitation is mild to moderate.  6. The aortic valve is tricuspid. Aortic valve regurgitation is trivial. Mild aortic valve sclerosis is present, with no evidence of aortic valve stenosis.  7. The inferior vena cava is normal in size with <50% respiratory variability, suggesting right atrial pressure of 8 mmHg. FINDINGS  Left Ventricle: Left ventricular ejection fraction, by estimation, is 55 to 60%. The left ventricle has normal function. The left ventricle has no regional wall motion abnormalities. The left ventricular internal cavity size was normal in size. There is  borderline left ventricular hypertrophy. The interventricular septum is flattened in systole and diastole, consistent with right ventricular pressure and volume overload. Left ventricular diastolic parameters are consistent with Grade I diastolic dysfunction (impaired relaxation). Right Ventricle: The right ventricular size is normal. No increase in right ventricular wall thickness. Right ventricular systolic function is mildly reduced. There is severely elevated pulmonary artery systolic pressure. The tricuspid regurgitant velocity is 3.67 m/s, and with an assumed right atrial pressure of 8 mmHg, the estimated right ventricular systolic pressure is 81.1 mmHg. Left Atrium: Left atrial size was mildly dilated. Right Atrium: Right atrial size was normal in size. Pericardium: There is no evidence of pericardial effusion. Mitral Valve: The mitral valve is abnormal. There is mild thickening of the mitral valve leaflet(s). There is mild calcification of the mitral valve leaflet(s). Mild to moderate mitral annular calcification. Mild mitral valve regurgitation. Tricuspid  Valve: The tricuspid valve is grossly normal. Tricuspid valve regurgitation is mild to moderate. Aortic Valve: The aortic valve is tricuspid. Aortic valve regurgitation is trivial. Mild aortic valve sclerosis is present, with no evidence of aortic valve stenosis. Mild aortic valve annular calcification. Pulmonic Valve: The pulmonic valve was grossly normal. Pulmonic valve regurgitation is trivial. Aorta: The aortic root is normal in size and structure. Venous: The inferior vena cava is normal in size with less than 50% respiratory variability, suggesting right atrial pressure of 8 mmHg. IAS/Shunts: No atrial level shunt detected by color flow Doppler.  LEFT VENTRICLE PLAX 2D LVIDd:         4.20 cm     Diastology LVIDs:         2.70 cm     LV e' lateral:   4.95 cm/s LV PW:         1.00 cm     LV E/e' lateral: 16.0 LV IVS:        1.00 cm     LV e' medial:    3.09 cm/s LVOT diam:     1.50 cm     LV E/e' medial:  25.6 LV SV:         40 LV SV Index:   29 LVOT Area:     1.77 cm  LV Volumes (MOD) LV vol d, MOD A2C: 74.2 ml LV vol d, MOD A4C: 99.4 ml LV vol s, MOD A2C: 38.1 ml LV vol s, MOD A4C: 50.3 ml LV SV MOD A2C:     36.1 ml LV SV  MOD A4C:     99.4 ml LV SV MOD BP:      43.9 ml RIGHT VENTRICLE RV S prime:     6.58 cm/s TAPSE (M-mode): 1.1 cm LEFT ATRIUM             Index       RIGHT ATRIUM           Index LA diam:        4.70 cm 3.46 cm/m  RA Area:     15.30 cm LA Vol (A2C):   47.7 ml 35.15 ml/m RA Volume:   39.10 ml  28.81 ml/m LA Vol (A4C):   50.5 ml 37.22 ml/m LA Biplane Vol: 49.4 ml 36.41 ml/m  AORTIC VALVE LVOT Vmax:   110.00 cm/s LVOT Vmean:  68.000 cm/s LVOT VTI:    0.224 m  AORTA Ao Root diam: 2.90 cm MITRAL VALVE               TRICUSPID VALVE MV Area (PHT): 2.26 cm    TR Peak grad:   53.9 mmHg MV Decel Time: 335 msec    TR Vmax:        367.00 cm/s MV E velocity: 79.20 cm/s MV A velocity: 93.20 cm/s  SHUNTS MV E/A ratio:  0.85        Systemic VTI:  0.22 m                            Systemic Diam: 1.50  cm Rozann Lesches MD Electronically signed by Rozann Lesches MD Signature Date/Time: 08/01/2019/5:36:32 PM    Final     Medications: Infusions: . sodium chloride    . sodium chloride    . dextrose Stopped (08/02/19 0501)  . lactated ringers Stopped (08/02/19 0501)  . phenylephrine (NEO-SYNEPHRINE) Adult infusion Stopped (08/02/19 0501)    Scheduled Medications: . arformoterol  15 mcg Nebulization BID  . budesonide (PULMICORT) nebulizer solution  0.5 mg Nebulization BID  . chlorhexidine gluconate (MEDLINE KIT)  15 mL Mouth Rinse BID  . Chlorhexidine Gluconate Cloth  6 each Topical Q0600  . docusate  100 mg Oral BID  . insulin aspart  0-6 Units Subcutaneous Q4H  . mouth rinse  15 mL Mouth Rinse 10 times per day  . pantoprazole (PROTONIX) IV  40 mg Intravenous QHS  . polyethylene glycol  17 g Oral Daily  . sodium chloride flush  10-40 mL Intracatheter Q12H  . Thrombi-Pad  1 each Topical Once    have reviewed scheduled and prn medications.  Physical Exam: General: Intubated, alert awake and opening eyes with the name Heart:RRR, s1s2 nl Lungs: Coarse breath sound bilateral Abdomen:soft, Non-tender, non-distended Extremities:No edema Dialysis Access: AV fistula has good thrill and bruit  Rebbecca Osuna Tanna Furry 08/02/2019,9:07 AM  LOS: 1 day  Pager: [phone]

## 2019-08-02 NOTE — Progress Notes (Signed)
RT attempted SBT 12/5 with pt this am. Despite stimulation, pt with low MVe, RR and VT. Pt returned to full support at that time. RT will continue to monitor.

## 2019-08-02 NOTE — Progress Notes (Signed)
eLink Physician-Brief Progress Note Patient Name: Christina [name] DOB: Mar 23, 1950 MRN: [id number]   Date of Service  08/02/2019  HPI/Events of Note  Request to review CXR for L subclavian CVL placement. L IJ CVL tip at junction of SVC and R atrium. No pneumothorax.   eICU Interventions  OK to use L subclavian CVL.      Intervention Category Major Interventions: Other:  Starlena Beil Cornelia Copa 08/02/2019, 6:19 AM

## 2019-08-02 NOTE — Progress Notes (Signed)
While in CT, femoral CVC accidentally removed. Pressure was held on site, pressure dressing placed when bleeding stopped. Hematoma developed around CVC site and is marked.  VSS. ELink made aware of event, ground team to assess and place new CVC upon arrival back to unit.

## 2019-08-02 NOTE — Progress Notes (Signed)
eLink Physician-Brief Progress Note Patient Name: Christina [name] DOB: 1950-08-26 MRN: [id number]   Date of Service  08/02/2019  HPI/Events of Note  Hypotension - 84/44 with MAP = 57. CVP = 11-13.  eICU Interventions  Plan: 1. Phenylephrine IV infusion. Titrate to MAP >= 65.      Intervention Category Major Interventions: Hypotension - evaluation and management  Zoila Ditullio Eugene 08/02/2019, 3:10 AM

## 2019-08-02 NOTE — Plan of Care (Addendum)
  Interdisciplinary Goals of Care Family Meeting   Date carried out:: 08/02/2019  Location of the meeting: Conference room  Member's involved: Nurse Practitioner and Family Member or next of kin - Daughters x2 - Christina [name] and [name].  [name]) [phone]  Durable Power of Attorney or acting medical decision maker: Christina [name] & [name]   Discussion: We discussed goals of care for Christina [name] .  Reviewed prior medical history, events leading up to admission.  Events as reported per Christina [name].  Family expressed concern that she would not want life support.  We discussed that after am conversation with Christina [name], we changed her code status to DNR.  They would like to give her a short trial to fix underlying reversible issues if possible and then extubate with no plan for reintubation if she fails.  Reviewed plan of care for the day with family.  Hopeful she will be able to extubate today or tomorrow after HD with hope she will be able to do the work of breathing.  Family would not want to leave her on vent for prolonged period of time.     Code status: Full DNR  Disposition: Continue current acute care  Time spent for the meeting: 20 minutes  Christina [name] 08/02/2019, 1:13 PM

## 2019-08-03 LAB — COMPREHENSIVE METABOLIC PANEL
ALT: 446 U/L — ABNORMAL HIGH (ref 0–44)
AST: 305 U/L — ABNORMAL HIGH (ref 15–41)
Albumin: 1.7 g/dL — ABNORMAL LOW (ref 3.5–5.0)
Alkaline Phosphatase: 73 U/L (ref 38–126)
Anion gap: 13 (ref 5–15)
BUN: 43 mg/dL — ABNORMAL HIGH (ref 8–23)
CO2: 34 mmol/L — ABNORMAL HIGH (ref 22–32)
Calcium: 7.1 mg/dL — ABNORMAL LOW (ref 8.9–10.3)
Chloride: 94 mmol/L — ABNORMAL LOW (ref 98–111)
Creatinine, Ser: 4.41 mg/dL — ABNORMAL HIGH (ref 0.44–1.00)
GFR calc Af Amer: 11 mL/min — ABNORMAL LOW (ref >60)
GFR calc non Af Amer: 10 mL/min — ABNORMAL LOW (ref >60)
Glucose, Bld: 166 mg/dL — ABNORMAL HIGH (ref 70–99)
Potassium: 3.3 mmol/L — ABNORMAL LOW (ref 3.5–5.1)
Sodium: 141 mmol/L (ref 135–145)
Total Bilirubin: 0.8 mg/dL (ref 0.3–1.2)
Total Protein: 3.9 g/dL — ABNORMAL LOW (ref 6.5–8.1)

## 2019-08-03 LAB — CBC
HCT: 24.3 % — ABNORMAL LOW (ref 36.0–46.0)
Hemoglobin: 7.2 g/dL — ABNORMAL LOW (ref 12.0–15.0)
MCH: 29.1 pg (ref 26.0–34.0)
MCHC: 29.6 g/dL — ABNORMAL LOW (ref 30.0–36.0)
MCV: 98.4 fL (ref 80.0–100.0)
Platelets: 101 10*3/uL — ABNORMAL LOW (ref 150–400)
RBC: 2.47 MIL/uL — ABNORMAL LOW (ref 3.87–5.11)
RDW: 17.6 % — ABNORMAL HIGH (ref 11.5–15.5)
WBC: 16.6 10*3/uL — ABNORMAL HIGH (ref 4.0–10.5)
nRBC: 0.4 % — ABNORMAL HIGH (ref 0.0–0.2)

## 2019-08-03 LAB — PHOSPHORUS
Phosphorus: 6.3 mg/dL — ABNORMAL HIGH (ref 2.5–4.6)
Phosphorus: 2.1 mg/dL — ABNORMAL LOW (ref 2.5–4.6)

## 2019-08-03 LAB — MAGNESIUM
Magnesium: 1.8 mg/dL (ref 1.7–2.4)
Magnesium: 1.7 mg/dL (ref 1.7–2.4)

## 2019-08-03 LAB — GLUCOSE, CAPILLARY
Glucose-Capillary: 111 mg/dL — ABNORMAL HIGH (ref 70–99)
Glucose-Capillary: 112 mg/dL — ABNORMAL HIGH (ref 70–99)
Glucose-Capillary: 114 mg/dL — ABNORMAL HIGH (ref 70–99)
Glucose-Capillary: 160 mg/dL — ABNORMAL HIGH (ref 70–99)
Glucose-Capillary: 162 mg/dL — ABNORMAL HIGH (ref 70–99)
Glucose-Capillary: 168 mg/dL — ABNORMAL HIGH (ref 70–99)
Glucose-Capillary: 180 mg/dL — ABNORMAL HIGH (ref 70–99)
Glucose-Capillary: 266 mg/dL — ABNORMAL HIGH (ref 70–99)

## 2019-08-03 LAB — POTASSIUM: Potassium: 3.7 mmol/L (ref 3.5–5.1)

## 2019-08-03 MED ORDER — GUAIFENESIN 100 MG/5ML PO SOLN
5.0000 mL | ORAL | Status: DC | PRN
  Administered 2019-08-04 – 2019-08-10 (×4): 100 mg via ORAL
  Filled 2019-08-03 (×5): qty 5

## 2019-08-03 MED ORDER — METHYLPREDNISOLONE SODIUM SUCC 40 MG IJ SOLR: 40.0000 mg | Freq: Four times a day (QID) | INTRAMUSCULAR | Status: DC | PRN

## 2019-08-03 MED ORDER — ALBUMIN HUMAN 25 % IV SOLN
INTRAVENOUS | Status: AC
  Filled 2019-08-03: qty 100

## 2019-08-03 MED ORDER — METHYLPREDNISOLONE SODIUM SUCC 40 MG IJ SOLR
40.0000 mg | Freq: Four times a day (QID) | INTRAMUSCULAR | Status: DC
  Administered 2019-08-03 – 2019-08-04 (×3): 40 mg via INTRAVENOUS
  Filled 2019-08-03 (×3): qty 1

## 2019-08-03 NOTE — Progress Notes (Signed)
Initial Nutrition Assessment  DOCUMENTATION CODES:   Non-severe (moderate) malnutrition in context of chronic illness  INTERVENTION:   Once diet advanced:   Nepro Shake po TID, each supplement provides 425 kcal and 19 grams protein  Renal MVI   NUTRITION DIAGNOSIS:   Moderate Malnutrition related to chronic illness (ESRD) as evidenced by mild fat depletion, moderate muscle depletion.  GOAL:   Patient will meet greater than or equal to 90% of their needs  MONITOR:   PO intake, Supplement acceptance, Weight trends, Diet advancement, Skin, Labs, I & O's  REASON FOR ASSESSMENT:   Rounds    ASSESSMENT:   Patient with PMH significant for HTN, HLD, CAD s/p stenting, s/p CEA on L, SMA stent, AF, ESRD on HD, ETOH abuse, COPD, GERD, and non-bleeding duodenal ulcers. Presents this admission s/p cardiac arrest from suspected hyperkalemia.   Pt discussed during ICU rounds and with RN.   Unable to obtain nutrition history PTA as pt just extubated. RD to provide supplementation once diet advanced to maximize kcal and protein this admission. Pt currently above her dry weight. Weight history reflects fluctuation between 40.5 kg and 50.8 kg. Pt shows moderate fat and muscle depletions but suspect fluid accumulation is making losses.   EDW: 40 kg  Current weight: 49.7 kg   I/O: +2,571 ml since admit  UOP: 45 ml x 24 hrs  Last HD on 7/5: -500 ml net UF  Medications: colace, SS novolog, miralax Labs: K 3.3 (L) Phosphorus 6.3 (H) CBG 158-180  NUTRITION - FOCUSED PHYSICAL EXAM:    Most Recent Value  Orbital Region Mild depletion  Upper Arm Region Mild depletion  Thoracic and Lumbar Region Unable to assess  Buccal Region Unable to assess  Temple Region Moderate depletion  Clavicle Bone Region Moderate depletion  Clavicle and Acromion Bone Region Moderate depletion  Scapular Bone Region Unable to assess  Dorsal Hand Unable to assess  Patellar Region Moderate depletion  Anterior  Thigh Region Moderate depletion  Posterior Calf Region Moderate depletion  Edema (RD Assessment) Mild  Hair Reviewed  Eyes Reviewed  Mouth Unable to assess  Skin Reviewed  Nails Unable to assess     Diet Order:   Diet Order            Diet NPO time specified  Diet effective now                 EDUCATION NEEDS:   Not appropriate for education at this time  Skin:  Skin Assessment: Skin Integrity Issues: Skin Integrity Issues:: Other (Comment) Other: L toe wound  Last BM:  7/4  Height:   Ht Readings from Last 1 Encounters:  08/01/19 5\' 2"  (1.575 m)    Weight:   Wt Readings from Last 1 Encounters:  08/03/19 47 kg    BMI:  Body mass index is 18.95 kg/m.  Estimated Nutritional Needs:   Kcal:  1500-1700 kcal  Protein:  75-95 grams  Fluid:  1000 ml + UOP   Mariana Single RD, LDN Clinical Nutrition Pager listed in [facility]

## 2019-08-03 NOTE — Progress Notes (Signed)
eLink Physician-Brief Progress Note Patient Name: Christina [name] DOB: Sep 23, 1950 MRN: [id number]   Date of Service  08/03/2019  HPI/Events of Note  Pt needs something to help her loosen sputum so she can cough it up.  eICU Interventions  Robitussin ordered.        Kerry Kass Elbridge Magowan 08/03/2019, 10:12 PM

## 2019-08-03 NOTE — Progress Notes (Addendum)
[facility] NEPHROLOGY PROGRESS NOTE  Assessment/ Plan: Pt is a 69 y.o. yo female  with h/o HTN, CAD s/p stent, A. fib, anxiety depression, anemia, SBO, mesenteric ischemia s/p SMA stenting, multiple recent admissions, ESRD on HD MWF at [facility] transferred from [facility] after cardiac arrest seen as a consultation for the management of ESRD. OP Orders: Dialyzes at [place] 40 kg, 180 NR, 4 hours HD Bath 2k 2 ca, Access: LUE AVF, no heparin Mircera 200 mch 6/30 and venofer 100 mg 3x/week. Calcitriol 0.75 mcg 3x/week  # Cardiac arrest/V. tach: Probably contributed by hyperkalemia. Echo with EF of 55 to 60%.  # ESRD: MWF: She had intradialytic hypotension in last treatment.  Plan for HD today.  Midodrine and albumin as needed for hypotension.  Systolic blood pressure was around 140s this morning without pressors. She has AV fistula for the access.  #Acute respiratory failure with hypoxia: Patient is alert awake today.  On mechanical ventilation.  # Hypertension: Blood pressure acceptable today.  May need midodrine for intradialytic hypotension. UF during HD.  Continue monitor.  # Anemia of ESRD: Monitor hemoglobin.  Recently got Mircera and iron.  Check iron studies.  Transfuse as needed.  # Metabolic Bone Disease:  Resume binders when able to take orally.  #CT head showed subdural hematoma.  Avoid heparin.  #Hypokalemia: Dialysis with 4K bath.  #Disposition: Poor adherence with outpatient dialysis.  She is DNR. Discussed with ICU team  Subjective: Seen and examined in ICU.  She was agitated therefore required Precedex.  Still on mechanical ventilation.  Opening eyes with the name. Objective Vital signs in last 24 hours: Vitals:   08/03/19 0500 08/03/19 0600 08/03/19 0700 08/03/19 0814  BP: (!) 145/49 (!) 143/49 (!) 143/49   Pulse:    (!) 59  Resp: (!) 26 20 (!) 21 19  Temp: (!) 97.5 F (36.4 C)  (!) 97.5 F (36.4 C)   TempSrc:    Bladder   SpO2: 100% 100% 100%   Weight:      Height:       Weight change: 0.6 kg  Intake/Output Summary (Last 24 hours) at 08/03/2019 0938 Last data filed at 08/03/2019 0700 Gross per 24 hour  Intake 962.12 ml  Output 45 ml  Net 917.12 ml       Labs: Basic Metabolic Panel: Recent Labs  Lab 08/01/19 1400 08/01/19 1400 08/02/19 0015 08/02/19 0225 08/02/19 1845 08/03/19 0346  NA 138  --  137  --   --  141  K 3.5  --  4.3  --   --  3.3*  CL 99  --  95*  --   --  94*  CO2 30  --  32  --   --  34*  GLUCOSE 64*   < > 316* 178*  --  166*  BUN 11  --  22  --   --  43*  CREATININE 1.72*  --  3.25*  --   --  4.41*  CALCIUM 6.9*  --  7.0*  --   --  7.1*  PHOS 2.5   < > 4.8*  --  5.9* 6.3*   < > = values in this interval not displayed.   Liver Function Tests: Recent Labs  Lab 08/01/19 1208 08/01/19 1400 08/03/19 0346  AST 1,044*  --  305*  ALT 561*  --  446*  ALKPHOS 79  --  73  BILITOT 1.6*  --  0.8  PROT 4.5*  --  3.9*  ALBUMIN 2.0* 1.9* 1.7*   No results for input(s): LIPASE, AMYLASE in the last 168 hours. No results for input(s): AMMONIA in the last 168 hours. CBC: Recent Labs  Lab 08/01/19 1208 08/01/19 1211 08/01/19 1400 08/02/19 0015 08/03/19 0346  WBC 9.1   < > 13.4* 17.4* 16.6*  NEUTROABS 8.8*  --   --   --   --   HGB 8.5*   < > 8.0* 7.4* 7.2*  HCT 28.5*   < > 26.7* 25.1* 24.3*  MCV 96.3  --  95.0 96.9 98.4  PLT 125*   < > 113* 102* 101*   < > = values in this interval not displayed.   Cardiac Enzymes: No results for input(s): CKTOTAL, CKMB, CKMBINDEX, TROPONINI in the last 168 hours. CBG: Recent Labs  Lab 08/02/19 1514 08/02/19 1949 08/03/19 0047 08/03/19 0350 08/03/19 0821  GLUCAP 138* 158* 180* 162* 168*    Iron Studies: No results for input(s): IRON, TIBC, TRANSFERRIN, FERRITIN in the last 72 hours. Studies/Results: DG Chest 1 View  Result Date: 08/02/2019 CLINICAL DATA:  Central line placement. EXAM: CHEST  1 VIEW COMPARISON:   08/01/2019.  CT 05/22/2019. FINDINGS: Interim placement left subclavian line, its tip is at the cavoatrial junction. Endotracheal tube and NG tube in stable position. Cardiomegaly. No pulmonary venous congestion. Mild bilateral interstitial prominence again noted. A component of these changes may be chronic however interstitial edema and/or pneumonitis can not be excluded. Tiny left pleural effusion. No pneumothorax. Surgical clips noted over the neck. IMPRESSION: 1. Interim placement of left subclavian line, its tip is at the cavoatrial junction. Endotracheal tube and NG tube in stable position. 2. Cardiomegaly. No pulmonary venous congestion. Mild bilateral interstitial prominence again noted. A component of these changes may be chronic however interstitial edema and/or pneumonitis cannot be excluded. Tiny left pleural effusion. Electronically Signed   By: Marcello Moores  Register   On: 08/02/2019 06:10   CT HEAD WO CONTRAST  Result Date: 08/02/2019 CLINICAL DATA:  Follow-up subdural hematoma EXAM: CT HEAD WITHOUT CONTRAST TECHNIQUE: Contiguous axial images were obtained from the base of the skull through the vertex without intravenous contrast. COMPARISON:  Yesterday FINDINGS: Brain: Trace high-density thickening left parafalcine at the vertex, not progressed. This is convincing for subdural hemorrhage when compared with a 06/17/2019 head CT. Extensive chronic small vessel ischemic injury with confluent gliosis in the cerebral white matter. Small remote bilateral cerebellar infarcts. Patchy remote cortical infarcts along the cerebral convexities. Chronic lacune at the right caudate head. No hydrocephalus or masslike finding Vascular: No hyperdense vessel or unexpected calcification. Skull: Normal. Negative for fracture or focal lesion. Sinuses/Orbits: No acute finding. IMPRESSION: 1. Stable trace parafalcine subdural hematoma. 2. Advanced chronic ischemic injury. Electronically Signed   By: Monte Fantasia M.D.   On:  08/02/2019 06:14   CT HEAD WO CONTRAST  Addendum Date: 08/01/2019   ADDENDUM REPORT: 08/01/2019 13:43 ADDENDUM: Study discussed by telephone with Pulmonary/Critical Care Dr. Valeta Harms on 08/01/2019 at 1339 hours. Electronically Signed   By: Genevie Ann M.D.   On: 08/01/2019 13:43   Result Date: 08/01/2019 CLINICAL DATA:  69 year old female status post cardiac arrest. Dialysis patient. EXAM: CT HEAD WITHOUT CONTRAST TECHNIQUE: Contiguous axial images were obtained from the base of the skull through the vertex without intravenous contrast. COMPARISON:  CTA head and neck 06/19/2019. Head CT 06/17/2019 and earlier. FINDINGS: Brain: Chronic encephalomalacia in the left frontal lobe, left parietal lobe, right  greater than left occipital lobes. Superimposed confluent bilateral white matter hypodensity. Chronic hypodensity scattered in the bilateral deep gray matter nuclei. Small chronic cerebellar infarcts again noted. No evidence of acute cerebral edema. No intracranial mass effect. However, there does appear to be a small or trace volume of subdural hematoma along the falx (coronal image 35). But no other acute intracranial hemorrhage identified. Vascular: Calcified atherosclerosis at the skull base. Skull: No acute osseous abnormality identified. Sinuses/Orbits: Visualized paranasal sinuses and mastoids are stable and well pneumatized. Other: Small volume of fluid in the visible pharynx with oral endotracheal and enteric tubes visible. No acute orbit or scalp soft tissue finding. IMPRESSION: 1. Trace acute subdural hematoma along the falx. But no other acute intracranial hemorrhage and no intracranial mass effect. 2. No CT evidence of anoxic injury. Advanced chronic ischemic disease appears stable from the CTA on 06/19/2019. Electronically Signed: By: Genevie Ann M.D. On: 08/01/2019 13:28   DG CHEST PORT 1 VIEW  Result Date: 08/02/2019 CLINICAL DATA:  Endotracheal tube placement. EXAM: PORTABLE CHEST 1 VIEW COMPARISON:   Earlier this day. FINDINGS: Endotracheal tube tip 4.8 cm from the carina. Tip and side port of the enteric tube below the diaphragm in the stomach. Left subclavian central line unchanged in the SVC. Stable cardiomegaly. Unchanged mediastinal contours with aortic atherosclerosis. Interstitial thickening suspicious for pulmonary edema, similar. Minimal retrocardiac opacity and blunting of the costophrenic angle. There is no pneumothorax. IMPRESSION: 1. Endotracheal tube tip 4.8 cm from the carina. Enteric tube in place. 2. Unchanged cardiomegaly and interstitial opacities suspicious for pulmonary edema. Possible small left pleural effusion. Electronically Signed   By: Keith Rake M.D.   On: 08/02/2019 18:59   DG Chest Port 1 View  Result Date: 08/01/2019 CLINICAL DATA:  Intubation.  Assess for pneumothorax. EXAM: PORTABLE CHEST 1 VIEW COMPARISON:  August 01, 2019 7:58 a.m. FINDINGS: Endotracheal tube is identified 5.2 cm from carina. There is no pleural line to suggest pneumothorax. Nasogastric tube is identified distal tip not included on film but is at least in the stomach. Mild diffuse increased interstitium is identified bilaterally. Patchy opacities of both lung bases are noted. The heart size is mildly enlarged. The bony structures are stable. IMPRESSION: 1. Endotracheal tube 5.2 cm from carina. No pneumothorax. 2. Mild pulmonary edema. 3. Patchy opacities of both lung bases, superimposed pneumonia is not excluded. Electronically Signed   By: Abelardo Diesel M.D.   On: 08/01/2019 13:05   ECHOCARDIOGRAM COMPLETE  Result Date: 08/01/2019    ECHOCARDIOGRAM REPORT   Patient Name:   Christina [name] Date of Exam: 08/01/2019 Medical Rec #:  [id number]          Height:       62.0 in Accession #:    [id number]         Weight:       89.3 lb Date of Birth:  February 23, 1950           BSA:          1.357 m Patient Age:    24 years           BP:           153/69 mmHg Patient Gender: F                  HR:           49 bpm.  Exam Location:  Inpatient Procedure: 2D Echo, Cardiac Doppler and Color Doppler Indications:  Cardiac arrest ([place]) [email or web address].ICD-9-CM]  History:        Patient has prior history of Echocardiogram examinations, most                 recent 06/20/2019. CAD, Stroke and COPD, Arrythmias:Atrial                 Fibrillation, Signs/Symptoms:Chest Pain; Risk                 Factors:Hypertension, Dyslipidemia and Former Smoker.  Sonographer:    Vickie Epley RDCS Referring Phys: [id number] GRACE E BOWSER  Sonographer Comments: Echo performed with patient supine and on artificial respirator. IMPRESSIONS  1. Left ventricular ejection fraction, by estimation, is 55 to 60%. The left ventricle has normal function. The left ventricle has no regional wall motion abnormalities. There is the interventricular septum is mildly flattened in systole and diastole, consistent with right ventricular pressure and volume overload.  2. Right ventricular systolic function is mildly reduced. The right ventricular size is normal. There is severely elevated pulmonary artery systolic pressure. The estimated right ventricular systolic pressure is 32.4 mmHg.  3. Left atrial size was mildly dilated.  4. The mitral valve is abnormal. Mild mitral valve regurgitation.  5. Tricuspid valve regurgitation is mild to moderate.  6. The aortic valve is tricuspid. Aortic valve regurgitation is trivial. Mild aortic valve sclerosis is present, with no evidence of aortic valve stenosis.  7. The inferior vena cava is normal in size with <50% respiratory variability, suggesting right atrial pressure of 8 mmHg. FINDINGS  Left Ventricle: Left ventricular ejection fraction, by estimation, is 55 to 60%. The left ventricle has normal function. The left ventricle has no regional wall motion abnormalities. The left ventricular internal cavity size was normal in size. There is  borderline left ventricular hypertrophy. The interventricular septum is flattened in systole and diastole,  consistent with right ventricular pressure and volume overload. Left ventricular diastolic parameters are consistent with Grade I diastolic dysfunction (impaired relaxation). Right Ventricle: The right ventricular size is normal. No increase in right ventricular wall thickness. Right ventricular systolic function is mildly reduced. There is severely elevated pulmonary artery systolic pressure. The tricuspid regurgitant velocity is 3.67 m/s, and with an assumed right atrial pressure of 8 mmHg, the estimated right ventricular systolic pressure is 40.1 mmHg. Left Atrium: Left atrial size was mildly dilated. Right Atrium: Right atrial size was normal in size. Pericardium: There is no evidence of pericardial effusion. Mitral Valve: The mitral valve is abnormal. There is mild thickening of the mitral valve leaflet(s). There is mild calcification of the mitral valve leaflet(s). Mild to moderate mitral annular calcification. Mild mitral valve regurgitation. Tricuspid Valve: The tricuspid valve is grossly normal. Tricuspid valve regurgitation is mild to moderate. Aortic Valve: The aortic valve is tricuspid. Aortic valve regurgitation is trivial. Mild aortic valve sclerosis is present, with no evidence of aortic valve stenosis. Mild aortic valve annular calcification. Pulmonic Valve: The pulmonic valve was grossly normal. Pulmonic valve regurgitation is trivial. Aorta: The aortic root is normal in size and structure. Venous: The inferior vena cava is normal in size with less than 50% respiratory variability, suggesting right atrial pressure of 8 mmHg. IAS/Shunts: No atrial level shunt detected by color flow Doppler.  LEFT VENTRICLE PLAX 2D LVIDd:         4.20 cm     Diastology LVIDs:         2.70 cm     LV e' lateral:   4.95  cm/s LV PW:         1.00 cm     LV E/e' lateral: 16.0 LV IVS:        1.00 cm     LV e' medial:    3.09 cm/s LVOT diam:     1.50 cm     LV E/e' medial:  25.6 LV SV:         40 LV SV Index:   29 LVOT Area:      1.77 cm  LV Volumes (MOD) LV vol d, MOD A2C: 74.2 ml LV vol d, MOD A4C: 99.4 ml LV vol s, MOD A2C: 38.1 ml LV vol s, MOD A4C: 50.3 ml LV SV MOD A2C:     36.1 ml LV SV MOD A4C:     99.4 ml LV SV MOD BP:      43.9 ml RIGHT VENTRICLE RV S prime:     6.58 cm/s TAPSE (M-mode): 1.1 cm LEFT ATRIUM             Index       RIGHT ATRIUM           Index LA diam:        4.70 cm 3.46 cm/m  RA Area:     15.30 cm LA Vol (A2C):   47.7 ml 35.15 ml/m RA Volume:   39.10 ml  28.81 ml/m LA Vol (A4C):   50.5 ml 37.22 ml/m LA Biplane Vol: 49.4 ml 36.41 ml/m  AORTIC VALVE LVOT Vmax:   110.00 cm/s LVOT Vmean:  68.000 cm/s LVOT VTI:    0.224 m  AORTA Ao Root diam: 2.90 cm MITRAL VALVE               TRICUSPID VALVE MV Area (PHT): 2.26 cm    TR Peak grad:   53.9 mmHg MV Decel Time: 335 msec    TR Vmax:        367.00 cm/s MV E velocity: 79.20 cm/s MV A velocity: 93.20 cm/s  SHUNTS MV E/A ratio:  0.85        Systemic VTI:  0.22 m                            Systemic Diam: 1.50 cm Rozann Lesches MD Electronically signed by Rozann Lesches MD Signature Date/Time: 08/01/2019/5:36:32 PM    Final     Medications: Infusions: . sodium chloride    . sodium chloride    . dexmedetomidine (PRECEDEX) IV infusion 0.4 mcg/kg/hr (08/03/19 0700)  . phenylephrine (NEO-SYNEPHRINE) Adult infusion Stopped (08/02/19 0501)    Scheduled Medications: . arformoterol  15 mcg Nebulization BID  . budesonide (PULMICORT) nebulizer solution  0.5 mg Nebulization BID  . chlorhexidine gluconate (MEDLINE KIT)  15 mL Mouth Rinse BID  . Chlorhexidine Gluconate Cloth  6 each Topical Q0600  . docusate  100 mg Oral BID  . feeding supplement (PRO-STAT SUGAR FREE 64)  30 mL Per Tube BID  . feeding supplement (VITAL HIGH PROTEIN)  1,000 mL Per Tube Q24H  . insulin aspart  0-6 Units Subcutaneous Q4H  . mouth rinse  15 mL Mouth Rinse 10 times per day  . pantoprazole (PROTONIX) IV  40 mg Intravenous QHS  . polyethylene glycol  17 g Oral Daily  . sodium  chloride flush  10-40 mL Intracatheter Q12H    have reviewed scheduled and prn medications.  Physical Exam: General: Remains intubated, alert and opens eyes with  the name. Heart:RRR, s1s2 nl Lungs: Coarse breath sound bilateral Abdomen:soft, Non-tender, non-distended Extremities:No edema Dialysis Access: AV fistula has good thrill and bruit  Ericson Nafziger Tanna Furry 08/03/2019,9:38 AM  LOS: 2 days  Pager: [phone]

## 2019-08-03 NOTE — Procedures (Signed)
Extubation Procedure Note  Patient Details:   Name: Christina [name] DOB: [birth year] MRN: [id number]   Airway Documentation:    Vent end date: 08/03/19 Vent end time: 1750   Evaluation  O2 sats: stable throughout Complications: No apparent complications Patient did tolerate procedure well. Bilateral Breath Sounds: Diminished, Rhonchi   Yes   MD at bedside and gave extubation order. Extubated to [place]. Positive cuff leak. Able to vocalize and clear secretions.  Saunders Glance 08/03/2019, 5:53 PM

## 2019-08-03 NOTE — Progress Notes (Signed)
PCCM:  Plans for one-way extubation.   Discussed with daughters at bedside.   Patient tolerated iHD well.  Orders placed for extubation  Patient tolerating SAT SBT  She is a DDNR   Garner Nash, DO [facility] 08/03/2019 5:47 PM

## 2019-08-03 NOTE — Progress Notes (Addendum)
NAME:  Christina [name], MRN:  [id number], DOB:  Oct 28, 1950, LOS: 2 ADMISSION DATE:  08/01/2019, CONSULTATION DATE:  08/01/19 REFERRING MD:  [facility], CHIEF COMPLAINT:  Cardiac arrest    Brief History   69 y/o F who admitted 7/5 as transfer from [facility] post asystolic arrest with 10 minute downtime. Hyperkalemia thought to be source of arrest / medical non-compliance, hypoglycemic.   Past Medical History  HTN HLD CAD - s/p 2 stents CEA on Left SMA Stent - May 2021  AF CKD / ESRD - on HD, T/Th/S, started in 03/2018 ETOH Abuse COPD GIB GERD Non-bleeding Duodenal Ulcers - seen on EGD in June 2021 Anemia Medical Non-compliance  Significant Hospital Events   7/05 Admit from [facility], attempted HD > hypotension 7/06 Sedation turned off during day, late pm pt reportedly attempted to pull ETT, precedex   Consults:  Nephrology  Procedures:  ETT 7/5 >>  R Fem TLC 7/5 >> 7/5 L Frost TLC 7/5 >>  Significant Diagnostic Tests:  CT Head 7/5 >> trace acute subdural hematoma along the falx, no other acute intracranial findings.  No CT evidence of anoxic injury ECHO 7/5 >> LVEF 55-60%, no RWMA, interventricular septum is mildly flattened in systole & diastole consistent with RV & volume overload, RV systolic function is mildly reduced.  Severely elevated pulmonary artery systolic pressure, estimated RV systolic pressure is 40.9.  LA mildly dilated, mild to moderate TR CT Head 7/6 >> stable trace parafalcine subdural hematoma, advanced chronic ischemic injury   Micro Data:  BCx2 7/5 >>  Tracheal aspirate 7/5 >>  COVID 7/5 >> negative  MRSA PCR 7/5 >> negative   Antimicrobials:    Interim history/subjective:  RN reports pt on low dose precedex, reportedly attempted to pull ett overnight Afebrile  Pending HD this am  On 40% FiO2, PEEP 5 I/O 45 ml UOP, +1.1L in last 24 hours   Objective   Blood pressure (!) 143/49, pulse (!) 59, temperature (!) 97.5 F (36.4  C), temperature source Bladder, resp. rate 19, height 5\' 2"  (1.575 m), weight 48.1 kg, SpO2 100 %.    Vent Mode: PRVC FiO2 (%):  [40 %] 40 % Set Rate:  [16 bmp] 16 bmp Vt Set:  [320 mL] 320 mL PEEP:  [5 cmH20] 5 cmH20 Pressure Support:  [12 cmH20-15 cmH20] 12 cmH20 Plateau Pressure:  [14 cmH20-16 cmH20] 14 cmH20   Intake/Output Summary (Last 24 hours) at 08/03/2019 7353 Last data filed at 08/03/2019 0700 Gross per 24 hour  Intake 1028.53 ml  Output 45 ml  Net 983.53 ml   Filed Weights   08/01/19 1520 08/02/19 0500 08/03/19 0357  Weight: 49.5 kg 48.3 kg 48.1 kg    Examination: General: chronically ill appearing adult female lying in bed in NAD HEENT: MM pink/moist, ETT, wearing glasses Neuro: Awakens to voice, nods, MAE CV: s1s2 rrr, SR this am, intermittent AF, no m/r/g PULM: non-labored on vent, lungs bilaterally clear  GI: soft, bsx4 active  Extremities: warm/dry, no edema, LUE AVF with thrill /+bruit  Skin: thin skin, scattered areas of ecchymosis  Resolved Hospital Problem list   Bleeding R Femoral TLC  Assessment & Plan:   Asystolic Cardiac Arrest  VT  Hx CAD s/p Stent, PVD, AF Suspect in setting of hyperkalemia, ESRD.   -ICU / tele monitoring  -SBP goal 120-140 -ASA, plavix on hold due to SDH  -hold home lopressor, imdur, amiodarone for now, pending tolerance for HD 7/7 may be  able to restart  Acute Hypoxic Respiratory Failure s/p Arrest  COPD -PRVC 8cc as rest mode -complete HD, then plan for extubation -FiO2 to support sats >90% -continue brovana, pulmicort BID in place of home symbicort -PRN albuterol  Hyperkalemia  ESRD on HD (T,Th,S) -appreciate Nephrology, plan for HD 7/7 -trend BMP -Nephrology will replace K during HD  Acute Metabolic Encephalopathy s/p Arrest SDH  New SDH identified on CT head post transfer.  Repeat CT 7/6 without change -follow serial neuro exams -SBP goal 120-140 -no anticoagulation for now  -PT efforts when able    -minimize sedation, turn off when HD starts  Anemia Thrombocytopenia  Suspect element of uremic bleeding, +/- ETOH liver disease but no mention on [place] or prior CT 05/2019 -trend CBC -transfuse for Hgb <7% or active bleeding  Severe Protein Calorie Malnutrition  -continue TF for now, reviewed plan for turning off for extubation once halfway through HD  Hypoglycemia  Suspect peripheral checks not accurate, does not correlate with check from central line -TF as above  -follow CBG, use line if question of hypoglycemia  Best practice:  Diet: NPO / TF  Pain/Anxiety/Delirium protocol (if indicated): precedex, PRN fentanyl   VAP protocol (if indicated): In place  DVT prophylaxis: SCD's  GI prophylaxis: PPI  Glucose control: CBG q4  Mobility: BR  Code Status: DNR Family Communication: [name]) called for update 7/6, reviewed events of admission.  Discussed code status.  [name] indicates she agrees with DNR.   Disposition: ICU   Labs   CBC: Recent Labs  Lab 08/01/19 1208 08/01/19 1208 08/01/19 1211 08/01/19 1250 08/01/19 1400 08/02/19 0015 08/03/19 0346  WBC 9.1  --   --   --  13.4* 17.4* 16.6*  NEUTROABS 8.8*  --   --   --   --   --   --   HGB 8.5*   < > 9.9* 8.8* 8.0* 7.4* 7.2*  HCT 28.5*   < > 29.0* 26.0* 26.7* 25.1* 24.3*  MCV 96.3  --   --   --  95.0 96.9 98.4  PLT 125*  --   --   --  113* 102* 101*   < > = values in this interval not displayed.    Basic Metabolic Panel: Recent Labs  Lab 08/01/19 1208 08/01/19 1208 08/01/19 1211 08/01/19 1250 08/01/19 1400 08/02/19 0015 08/02/19 0225 08/02/19 1845 08/03/19 0346  NA 149*   < > 145 144 138 137  --   --  141  K 3.9   < > 4.0 3.5 3.5 4.3  --   --  3.3*  CL 91*  --   --   --  99 95*  --   --  94*  CO2 34*  --   --   --  30 32  --   --  34*  GLUCOSE 297*  --   --   --  64* 316* 178*  --  166*  BUN 50*  --   --   --  11 22  --   --  43*  CREATININE 6.34*  --   --   --  1.72* 3.25*  --   --  4.41*   CALCIUM 8.5*  --   --   --  6.9* 7.0*  --   --  7.1*  MG 3.3*  --   --   --   --  1.9  --  1.8 1.8  PHOS 8.9*  --   --   --  2.5 4.8*  --  5.9* 6.3*   < > = values in this interval not displayed.   GFR: Estimated Creatinine Clearance: 9.1 mL/min (A) (by C-G formula based on SCr of 4.41 mg/dL (H)). Recent Labs  Lab 08/01/19 1208 08/01/19 1400 08/01/19 1504 08/02/19 0015 08/03/19 0346  PROCALCITON 0.68  --   --   --   --   WBC 9.1 13.4*  --  17.4* 16.6*  LATICACIDVEN 9.8*  --  1.9  --   --     Liver Function Tests: Recent Labs  Lab 08/01/19 1208 08/01/19 1400 08/03/19 0346  AST 1,044*  --  305*  ALT 561*  --  446*  ALKPHOS 79  --  73  BILITOT 1.6*  --  0.8  PROT 4.5*  --  3.9*  ALBUMIN 2.0* 1.9* 1.7*   No results for input(s): LIPASE, AMYLASE in the last 168 hours. No results for input(s): AMMONIA in the last 168 hours.  ABG    Component Value Date/Time   PHART 7.548 (H) 08/01/2019 1250   PCO2ART 51.1 (H) 08/01/2019 1250   PO2ART 141 (H) 08/01/2019 1250   HCO3 44.8 (H) 08/01/2019 1250   TCO2 46 (H) 08/01/2019 1250   ACIDBASEDEF 2.0 02/04/2019 0527   O2SAT 99.0 08/01/2019 1250     Coagulation Profile: Recent Labs  Lab 08/01/19 1208  INR 1.8*    Cardiac Enzymes: No results for input(s): CKTOTAL, CKMB, CKMBINDEX, TROPONINI in the last 168 hours.  HbA1C: Hgb A1c MFr Bld  Date/Time Value Ref Range Status  06/19/2019 03:12 AM 4.9 4.8 - 5.6 % Final    Comment:    (NOTE) Pre diabetes:          5.7%-6.4% Diabetes:              >6.4% Glycemic control for   <7.0% adults with diabetes   02/04/2019 05:06 AM 5.5 4.8 - 5.6 % Final    Comment:    (NOTE) Pre diabetes:          5.7%-6.4% Diabetes:              >6.4% Glycemic control for   <7.0% adults with diabetes     CBG: Recent Labs  Lab 08/02/19 1114 08/02/19 1514 08/02/19 1949 08/03/19 0047 08/03/19 0350  GLUCAP 131* 138* 158* 180* 162*    Critical care time: 34 minutes      Noe Gens, MSN, NP-C [facility] 08/03/2019, 8:21 AM   Please see [email or web address] for pager details.    PCCM Attending:   23 y os/p cardiac arrest, hyperkalemia, iHD ESRD at baseline   BP 133/63 (BP Location: Right Leg)   Pulse 64   Temp 98.9 F (37.2 C) (Axillary)   Resp (!) 30   Ht 5\' 2"  (1.575 m)   Wt 49.7 kg   SpO2 100%   BMI 20.04 kg/m  Gen: chronically ill appearing elderly fm on MV  HENT: ett in place Neuro: alert, following commands on MV  Heart: RRR, s1 s2 Lungs: CTAB  Labs reviewed: K better   A: Asystolic arrest  VT with pulse AHRF 2/2 above  COPD baseline ESRD on iHD, h/o of non-compliance per nephro  SDH stable   P: SAT SBT  iHD today  Consider liberation from vent after HD  PAD guidelines for sedation  On precedex currently  Patient is DNR, please see separate documentation by Noe Gens, NP   This patient is critically ill  with multiple organ system failure; which, requires frequent high complexity decision making, assessment, support, evaluation, and titration of therapies. This was completed through the application of advanced monitoring technologies and extensive interpretation of multiple databases. During this encounter critical care time was devoted to patient care services described in this note for 31 minutes.  Garner Nash, DO [place] 08/03/2019 2:33 PM

## 2019-08-04 ENCOUNTER — Other Ambulatory Visit: Payer: Self-pay

## 2019-08-04 ENCOUNTER — Inpatient Hospital Stay (HOSPITAL_COMMUNITY): Payer: Medicare Other

## 2019-08-04 DIAGNOSIS — D696 Thrombocytopenia, unspecified: Secondary | ICD-10-CM

## 2019-08-04 DIAGNOSIS — N186 End stage renal disease: Secondary | ICD-10-CM

## 2019-08-04 DIAGNOSIS — E440 Moderate protein-calorie malnutrition: Secondary | ICD-10-CM | POA: Insufficient documentation

## 2019-08-04 DIAGNOSIS — D649 Anemia, unspecified: Secondary | ICD-10-CM

## 2019-08-04 DIAGNOSIS — J9601 Acute respiratory failure with hypoxia: Secondary | ICD-10-CM

## 2019-08-04 LAB — BASIC METABOLIC PANEL
Anion gap: 12 (ref 5–15)
BUN: 21 mg/dL (ref 8–23)
CO2: 28 mmol/L (ref 22–32)
Calcium: 8 mg/dL — ABNORMAL LOW (ref 8.9–10.3)
Chloride: 99 mmol/L (ref 98–111)
Creatinine, Ser: 2.87 mg/dL — ABNORMAL HIGH (ref 0.44–1.00)
GFR calc Af Amer: 19 mL/min — ABNORMAL LOW (ref >60)
GFR calc non Af Amer: 16 mL/min — ABNORMAL LOW (ref >60)
Glucose, Bld: 182 mg/dL — ABNORMAL HIGH (ref 70–99)
Potassium: 3.8 mmol/L (ref 3.5–5.1)
Sodium: 139 mmol/L (ref 135–145)

## 2019-08-04 LAB — CBC
HCT: 27 % — ABNORMAL LOW (ref 36.0–46.0)
Hemoglobin: 8 g/dL — ABNORMAL LOW (ref 12.0–15.0)
MCH: 29.6 pg (ref 26.0–34.0)
MCHC: 29.6 g/dL — ABNORMAL LOW (ref 30.0–36.0)
MCV: 100 fL (ref 80.0–100.0)
Platelets: 120 10*3/uL — ABNORMAL LOW (ref 150–400)
RBC: 2.7 MIL/uL — ABNORMAL LOW (ref 3.87–5.11)
RDW: 18.3 % — ABNORMAL HIGH (ref 11.5–15.5)
WBC: 14.9 10*3/uL — ABNORMAL HIGH (ref 4.0–10.5)
nRBC: 0.2 % (ref 0.0–0.2)

## 2019-08-04 LAB — CULTURE, RESPIRATORY W GRAM STAIN
Culture: NORMAL
Special Requests: NORMAL

## 2019-08-04 LAB — GLUCOSE, CAPILLARY
Glucose-Capillary: 140 mg/dL — ABNORMAL HIGH (ref 70–99)
Glucose-Capillary: 148 mg/dL — ABNORMAL HIGH (ref 70–99)
Glucose-Capillary: 154 mg/dL — ABNORMAL HIGH (ref 70–99)
Glucose-Capillary: 161 mg/dL — ABNORMAL HIGH (ref 70–99)
Glucose-Capillary: 168 mg/dL — ABNORMAL HIGH (ref 70–99)
Glucose-Capillary: 96 mg/dL (ref 70–99)

## 2019-08-04 MED ORDER — METOPROLOL TARTRATE 25 MG PO TABS
25.0000 mg | ORAL_TABLET | Freq: Two times a day (BID) | ORAL | Status: DC
  Administered 2019-08-04 (×2): 25 mg via ORAL
  Filled 2019-08-04 (×2): qty 1

## 2019-08-04 MED ORDER — AMIODARONE IV BOLUS ONLY 150 MG/100ML
INTRAVENOUS | Status: AC
  Filled 2019-08-04: qty 100

## 2019-08-04 MED ORDER — TRAMADOL HCL 50 MG PO TABS
50.0000 mg | ORAL_TABLET | Freq: Four times a day (QID) | ORAL | Status: DC | PRN
  Administered 2019-08-04 – 2019-08-11 (×13): 50 mg via ORAL
  Filled 2019-08-04 (×13): qty 1

## 2019-08-04 MED ORDER — CHLORHEXIDINE GLUCONATE CLOTH 2 % EX PADS
6.0000 | MEDICATED_PAD | Freq: Every day | CUTANEOUS | Status: DC
  Administered 2019-08-05 – 2019-08-10 (×7): 6 via TOPICAL

## 2019-08-04 MED ORDER — AMIODARONE LOAD VIA INFUSION: 150.0000 mg | Freq: Once | INTRAVENOUS | Status: AC

## 2019-08-04 MED ORDER — CHLORHEXIDINE GLUCONATE 0.12 % MT SOLN
OROMUCOSAL | Status: AC
  Filled 2019-08-04: qty 15

## 2019-08-04 MED ORDER — AMIODARONE HCL IN DEXTROSE 360-4.14 MG/200ML-% IV SOLN
60.0000 mg/h | INTRAVENOUS | Status: DC
  Administered 2019-08-04: 60 mg/h via INTRAVENOUS

## 2019-08-04 MED ORDER — AMIODARONE HCL IN DEXTROSE 360-4.14 MG/200ML-% IV SOLN
30.0000 mg/h | INTRAVENOUS | Status: DC
  Administered 2019-08-04: 30 mg/h via INTRAVENOUS
  Filled 2019-08-04: qty 400

## 2019-08-04 MED ORDER — AMIODARONE HCL 200 MG PO TABS
200.0000 mg | ORAL_TABLET | Freq: Two times a day (BID) | ORAL | Status: DC
  Administered 2019-08-04 – 2019-08-12 (×17): 200 mg via ORAL
  Filled 2019-08-04 (×17): qty 1

## 2019-08-04 MED ORDER — AMIODARONE HCL IN DEXTROSE 360-4.14 MG/200ML-% IV SOLN
INTRAVENOUS | Status: AC
  Administered 2019-08-04: 150 mg via INTRAVENOUS
  Filled 2019-08-04: qty 200

## 2019-08-04 MED ORDER — ORAL CARE MOUTH RINSE
15.0000 mL | Freq: Two times a day (BID) | OROMUCOSAL | Status: DC
  Administered 2019-08-04 – 2019-08-11 (×13): 15 mL via OROMUCOSAL

## 2019-08-04 NOTE — Plan of Care (Signed)
Pt has had a rough night. CP from compressions continues and pt intermittently experiences what she describes as "it feels like my heart is twisting." Per pt, nothing has eased pain as of yet. MD notified. Pt is anxious and restless--chaplain referral made. While this RN was in a sterile procedure in another room, pt went into afib rvr--confirmed by EKG. Amio bolus and gtt started, but pt's anxiety worsened. Generally has been hypertensive, but BP softer in setting of afib rvr. Pt's fever did progressively worsen--tylenol given in apple sauce (pt tolerated well). Due to sheer amount of secretions and difficulty expelling them, SLP consult placed. Doing well on 4LNC. RR is intermittently high, but likely dt pain and anxiety. RN will continue to monitor closely.   Problem: Clinical Measurements: Goal: Ability to maintain clinical measurements within normal limits will improve Outcome: Progressing Goal: Diagnostic test results will improve Outcome: Progressing Goal: Respiratory complications will improve Outcome: Progressing   Problem: Elimination: Goal: Will not experience complications related to bowel motility Outcome: Progressing   Problem: Skin Integrity: Goal: Risk for impaired skin integrity will decrease Outcome: Progressing   Problem: Coping: Goal: Level of anxiety will decrease Outcome: Not Progressing   Problem: Pain Managment: Goal: General experience of comfort will improve Outcome: Not Progressing

## 2019-08-04 NOTE — Progress Notes (Signed)
   08/04/19 0900  Clinical Encounter Type  Visited With Patient  Visit Type Initial;Spiritual support  Referral From Nurse  Consult/Referral To Chaplain  Spiritual Encounters  Spiritual Needs Prayer  Stress Factors  Patient Stress Factors Major life changes;Exhausted   Chaplain engaged in initial visit with Christina [name].  During visit, Christina [name] voiced that she was fearful of going to sleep.  That fear comes from seeing her mother-in-law have a stroke and witnessing her become paralyzed, as well as recently having to be revived.  Christina [name] seems to be afraid of death and also experiencing any paralysis.  Christina [name] may also be experiencing anxiety because she has not been sleeping which are tied to the above fears. She voiced that she is hoping things will get better.  Christina [name] wants to progress. Christina [name] conveyed wanting to know answers regarding what is happening to her body.   Chaplain provided the ministries of presence, listening, and prayer.  Chaplain was able to repeat Christina [name]'s words, "...hoping to be better" and sit with her in that.  Chaplain offered support and will continue to follow-up.

## 2019-08-04 NOTE — Progress Notes (Signed)
Patient off amiodarone gtt.  BP, HR stable in 50's/60's.  Speech evaluation noted, advance diet as tolerated. Transfer to tele, to [facility] as of 7/9 am.    Noe Gens, MSN, NP-C [place] 08/04/2019, 1:52 PM   Please see [email or web address] for pager details.

## 2019-08-04 NOTE — Progress Notes (Signed)
Christina [name] NEPHROLOGY PROGRESS NOTE  Assessment/ Plan: Pt is a 69 y.o. yo female  with h/o HTN, CAD s/p stent, A. fib, anxiety depression, anemia, SBO, mesenteric ischemia s/p SMA stenting, multiple recent admissions, ESRD on HD MWF at [facility] transferred from [facility] after cardiac arrest seen as a consultation for the management of ESRD. OP Orders: Dialyzes at [place] 40 kg, 180 NR, 4 hours HD Bath 2k 2 ca, Access: LUE AVF, no heparin Mircera 200 mch 6/30 and venofer 100 mg 3x/week. Calcitriol 0.75 mcg 3x/week  # Cardiac arrest/V. tach: Probably contributed by hyperkalemia. Echo with EF of 55 to 60%.  # ESRD: MWF: Tolerated dialysis well yesterday with 2 L UF.  AV fistula for the access.  Plan for next HD tomorrow.  #Acute respiratory failure with hypoxia: She is now extubated.  Alert awake.  # Hypertension: Blood pressure acceptable today.  May need midodrine for intradialytic hypotension. UF during HD.  Continue monitor.  # Anemia of ESRD: Monitor hemoglobin.  Recently got Mircera and iron.  Check iron studies.  Transfuse as needed.  # Metabolic Bone Disease:  Resume binders when able to take orally.  Apparently phosphorus was low yesterday.  Monitor lab.  #CT head showed subdural hematoma.  Avoid heparin.  #Hypokalemia: Improved with high potassium bath.  #Disposition: Poor adherence with outpatient dialysis.  She is DNR.   Subjective: Seen and examined in ICU.  She is now extubated.  Alert awake and talking.  Complaining of generalized body pain including chest pain where she had CPR. Objective Vital signs in last 24 hours: Vitals:   08/04/19 0500 08/04/19 0600 08/04/19 0700 08/04/19 0914  BP: 112/61 108/62 (!) 127/49 (!) 137/52  Pulse:    63  Resp: (!) _0 Temp: 98.8 F (37.1 C) 98.6 F (37 C) 98.4 F (36.9 C)   TempSrc:      SpO2: 100% 100% 99%   Weight:      Height:       Weight change: 1.6  kg  Intake/Output Summary (Last 24 hours) at 08/04/2019 1024 Last data filed at 08/04/2019 0700 Gross per 24 hour  Intake 219.07 ml  Output 2125 ml  Net -1905.93 ml       Labs: Basic Metabolic Panel: Recent Labs  Lab 08/02/19 0015 08/02/19 0015 08/02/19 0225 08/02/19 1845 08/03/19 0346 08/03/19 1621 08/03/19 2033 08/04/19 0331  NA 137  --   --   --  141  --   --  139  K 4.3   < >  --   --  3.3*  --  3.7 3.8  CL 95*  --   --   --  94*  --   --  99  CO2 32  --   --   --  34*  --   --  28  GLUCOSE 316*   < > 178*  --  166*  --   --  182*  BUN 22  --   --   --  43*  --   --  21  CREATININE 3.25*  --   --   --  4.41*  --   --  2.87*  CALCIUM 7.0*  --   --   --  7.1*  --   --  8.0*  PHOS 4.8*   < >  --  5.9* 6.3* 2.1*  --   --    < > =  values in this interval not displayed.   Liver Function Tests: Recent Labs  Lab 08/01/19 1208 08/01/19 1400 08/03/19 0346  AST 1,044*  --  305*  ALT 561*  --  446*  ALKPHOS 79  --  73  BILITOT 1.6*  --  0.8  PROT 4.5*  --  3.9*  ALBUMIN 2.0* 1.9* 1.7*   No results for input(s): LIPASE, AMYLASE in the last 168 hours. No results for input(s): AMMONIA in the last 168 hours. CBC: Recent Labs  Lab 08/01/19 1208 08/01/19 1211 08/01/19 1400 08/01/19 1400 08/02/19 0015 08/03/19 0346 08/04/19 0331  WBC 9.1   < > 13.4*   < > 17.4* 16.6* 14.9*  NEUTROABS 8.8*  --   --   --   --   --   --   HGB 8.5*   < > 8.0*   < > 7.4* 7.2* 8.0*  HCT 28.5*   < > 26.7*   < > 25.1* 24.3* 27.0*  MCV 96.3  --  95.0  --  96.9 98.4 100.0  PLT 125*   < > 113*   < > 102* 101* 120*   < > = values in this interval not displayed.   Cardiac Enzymes: No results for input(s): CKTOTAL, CKMB, CKMBINDEX, TROPONINI in the last 168 hours. CBG: Recent Labs  Lab 08/03/19 1624 08/03/19 2000 08/03/19 2343 08/04/19 0342 08/04/19 0808  GLUCAP 111* 112* 114* 154* 161*    Iron Studies: No results for input(s): IRON, TIBC, TRANSFERRIN, FERRITIN in the last 72  hours. Studies/Results: DG CHEST PORT 1 VIEW  Result Date: 08/02/2019 CLINICAL DATA:  Endotracheal tube placement. EXAM: PORTABLE CHEST 1 VIEW COMPARISON:  Earlier this day. FINDINGS: Endotracheal tube tip 4.8 cm from the carina. Tip and side port of the enteric tube below the diaphragm in the stomach. Left subclavian central line unchanged in the SVC. Stable cardiomegaly. Unchanged mediastinal contours with aortic atherosclerosis. Interstitial thickening suspicious for pulmonary edema, similar. Minimal retrocardiac opacity and blunting of the costophrenic angle. There is no pneumothorax. IMPRESSION: 1. Endotracheal tube tip 4.8 cm from the carina. Enteric tube in place. 2. Unchanged cardiomegaly and interstitial opacities suspicious for pulmonary edema. Possible small left pleural effusion. Electronically Signed   By: Keith Rake M.D.   On: 08/02/2019 18:59    Medications: Infusions: . sodium chloride    . sodium chloride    . amiodarone 60 mg/hr (08/04/19 0700)    Scheduled Medications: . amiodarone  200 mg Oral BID  . arformoterol  15 mcg Nebulization BID  . budesonide (PULMICORT) nebulizer solution  0.5 mg Nebulization BID  . chlorhexidine gluconate (MEDLINE KIT)  15 mL Mouth Rinse BID  . Chlorhexidine Gluconate Cloth  6 each Topical Q0600  . insulin aspart  0-6 Units Subcutaneous Q4H  . mouth rinse  15 mL Mouth Rinse BID  . methylPREDNISolone (SOLU-MEDROL) injection  40 mg Intravenous Q6H  . metoprolol tartrate  25 mg Oral BID  . pantoprazole (PROTONIX) IV  40 mg Intravenous QHS  . sodium chloride flush  10-40 mL Intracatheter Q12H    have reviewed scheduled and prn medications.  Physical Exam: General: Alert awake and following commands, extubated. Heart:RRR, s1s2 nl, no rubs Lungs: Basal crackles, no wheezing Abdomen:soft, Non-tender, non-distended Extremities:No LE edema Dialysis Access: AV fistula has good thrill and bruit  Christina [name] 08/04/2019,10:24 AM   LOS: 3 days  Pager: [phone]

## 2019-08-04 NOTE — Progress Notes (Signed)
Patient known to Renal Navigator from multiple past admissions. Navigator met with patient at bedside to offer support. Patient awake, alert and welcoming of Navigator's visit. She appears anxious, but thankful to see a familiar face. She states she is doing well under the circumstances. She reports that she has had a stroke, adding, "they brought me back." Navigator asked her how she is feeling about that provided space for patient to talk about her experience. She concludes that she is thankful to be here, that she needs to change some things about her life in order to keep living, but that she does not wish to be brought back again if she were ever to reach a near death situation again. She states she has notified everyone who needs to know of this decision. Navigator notes that she is listed as a DNR. We talked about the importance of compliance with HD if she is making the decision to live and make changes in her lifestyle to improve her quality of living. She states agreement and cannot think of anything that Navigator can do to assist her with this goal. Navigator acknowledges difficulties of living with chronic illness, hx of substance use and asked if patient felt she would like resources for counseling. She states, "I think I can do this on my own," and thanked Renal Navigator. She is willing to take Navigator's phone number and call if she changes her mind. Patient was very appreciative of visit and agreeable to Navigator checking again periodically. Navigator provided update to patient's OP HD clinic to provide continuity of care.  ,  Elizabeth, LCSW Renal Navigator [phone]  

## 2019-08-04 NOTE — Evaluation (Signed)
Clinical/Bedside Swallow Evaluation Patient Details  Name: Christina [name] MRN: [id number] Date of Birth: 01/26/1951  Today's Date: 08/04/2019 Time: SLP Start Time (ACUTE ONLY): 4818 SLP Stop Time (ACUTE ONLY): 0938 SLP Time Calculation (min) (ACUTE ONLY): 17 min  Past Medical History:  Past Medical History:  Diagnosis Date  . Anemia   . Anxiety   . Arthritis   . Asthma   . Atrial fibrillation ([place])   . Chronic kidney disease    Dialysis T/Th/Sa  started in March 2020  . COPD (chronic obstructive pulmonary disease) ([place])   . Coronary artery disease    2 stents  . Depression   . GERD (gastroesophageal reflux disease)   . GI bleeding 06/15/2019  . Gout   . Headache    migraines  . History of kidney stones   . Hyperlipidemia   . Hypertension   . Pneumonia   . Small bowel obstruction [facility])    Past Surgical History:  Past Surgical History:  Procedure Laterality Date  . ABDOMINAL AORTOGRAM W/LOWER EXTREMITY Bilateral 07/08/2019   Procedure: ABDOMINAL AORTOGRAM W/LOWER EXTREMITY;  Surgeon: Serafina Mitchell, MD;  Location: [place] CV [facility];  Service: Vascular;  Laterality: Bilateral;  . ABDOMINAL HYSTERECTOMY    . ABDOMINAL SURGERY     for small bowel obstruction - x 2  . APPENDECTOMY    . AV FISTULA PLACEMENT Left 08/04/2018   Procedure: ARTERIOVENOUS (AV) FISTULA CREATION LEFT ARM;  Surgeon: Waynetta Sandy, MD;  Location: [place];  Service: Vascular;  Laterality: Left;  . BASCILIC VEIN TRANSPOSITION Left 11/24/2018   Procedure: SECOND STAGE BASILIC VEIN TRANSPOSITION LEFT ARM;  Surgeon: Waynetta Sandy, MD;  Location: [place];  Service: Vascular;  Laterality: Left;  . BIOPSY  03/26/2019   Procedure: BIOPSY;  Surgeon: Lavena Bullion, DO;  Location: [facility] ENDOSCOPY;  Service: Gastroenterology;;  . BIOPSY  07/05/2019   Procedure: BIOPSY;  Surgeon: Ladene Artist, MD;  Location: [facility] ENDOSCOPY;  Service: Endoscopy;;  . CARDIAC CATHETERIZATION    . CORONARY  ANGIOPLASTY  ?2003/2004  . ESOPHAGOGASTRODUODENOSCOPY N/A 06/16/2019   Procedure: ESOPHAGOGASTRODUODENOSCOPY (EGD);  Surgeon: [name], MD;  Location: [facility] ENDOSCOPY;  Service: Endoscopy;  Laterality: N/A;  . ESOPHAGOGASTRODUODENOSCOPY (EGD) WITH PROPOFOL N/A 03/26/2019   Procedure: ESOPHAGOGASTRODUODENOSCOPY (EGD) WITH PROPOFOL;  Surgeon: Lavena Bullion, DO;  Location: [place];  Service: Gastroenterology;  Laterality: N/A;  . ESOPHAGOGASTRODUODENOSCOPY (EGD) WITH PROPOFOL N/A 07/05/2019   Procedure: ESOPHAGOGASTRODUODENOSCOPY (EGD) WITH PROPOFOL;  Surgeon: Ladene Artist, MD;  Location: [facility] ENDOSCOPY;  Service: Endoscopy;  Laterality: N/A;  . FACIAL RECONSTRUCTION SURGERY     x 2  . HERNIA REPAIR    . INTRAVASCULAR LITHOTRIPSY Right 07/12/2019   Procedure: INTRAVASCULAR LITHOTRIPSY;  Surgeon: Serafina Mitchell, MD;  Location: [place] [facility];  Service: Cardiovascular;  Laterality: Right;  sfa  . IR REMOVAL TUN CV CATH W/O FL  07/14/2019  . PERIPHERAL VASCULAR BALLOON ANGIOPLASTY Right 07/12/2019   Procedure: PERIPHERAL VASCULAR BALLOON ANGIOPLASTY;  Surgeon: Serafina Mitchell, MD;  Location: [place] [facility];  Service: Cardiovascular;  Laterality: Right;  sfa  . PERIPHERAL VASCULAR INTERVENTION  04/27/2019   Procedure: PERIPHERAL VASCULAR INTERVENTION;  Surgeon: Marty Heck, MD;  Location: [place] CV [facility];  Service: Cardiovascular;;  SMA  . PERIPHERAL VASCULAR INTERVENTION Left 07/08/2019   Procedure: PERIPHERAL VASCULAR INTERVENTION;  Surgeon: Serafina Mitchell, MD;  Location: [place] [facility];  Service: Vascular;  Laterality: Left;  left  common and external iliac  . VISCERAL ANGIOGRAPHY N/A 04/27/2019   Procedure: MESENTERIC ANGIOGRAPHY;  Surgeon: Marty Heck, MD;  Location: [place] [facility];  Service: Cardiovascular;  Laterality: N/A;   HPI:  Pt is a 69 y/o female who presented to [facility] on 7/5 and was subsequently transferred to [facility].  While in ED, she was noted to have VT with a pulse and was cardioverted, became asystolic with 10 minutes of CPR before ROSC.  Post CPR, the patient was reportedly awake/ pulling at ETT and was sedated. Pt extubated on 7/7. CXr 7/6: cardiomegaly and interstitial opacities suspicious for pulmonary edema. Possible small left pleural effusion. CT head: Stable trace parafalcine subdural hematoma. BSE 03/2019: WNL. EGD 6/8 showed multiple plagues in the mid and distal esophagus, smal HH, two non-bleeding duodenal ulcers.    Assessment / Plan / Recommendation Clinical Impression  Pt was seen for bedside swallow evaluation. She denied any signs of aspiration but stated that last week she had difficulty swallowing crackers because they "kept getting bigger" in her mouth. Oral mechanism exam was [facility] and she presented with limited dentition. She tolerated all solids and liquids without signs or symptoms of aspiration and her oropharyngeal swallow mechanism appeared functional. However, she inconsistently demonstrated eructation, grimacing and reported pain as well as discomfort during intake of thin liquids. Pt described her sensation as the liquid "twisting" and stated that she sensed it in her chest area. These symptoms were noted with thin liquids via straw and to a lesser extent via cup. A regular texture diet with thin liquids is recommended at this time. Esophageal assessment and/or GI consult may be warranted if pt's symptoms persist. SLP will follow to ensure diet tolerance.  SLP Visit Diagnosis: Dysphagia, unspecified (R13.10)    Aspiration Risk  No limitations    Diet Recommendation Regular;Thin liquid   Liquid Administration via: Cup;Straw (Per pt's preference/tolerance. ) Medication Administration: Whole meds with liquid Supervision: Patient able to self feed Compensations: Slow rate;Small sips/bites Postural Changes: Seated upright at 90 degrees    Other  Recommendations Oral Care  Recommendations: Oral care BID   Follow up Recommendations None      Frequency and Duration min 1 x/week  1 week       Prognosis Prognosis for Safe Diet Advancement: Good      Swallow Study   General Date of Onset: 08/03/19 HPI: Pt is a 69 y/o female who presented to [facility] on 7/5 and was subsequently transferred to [facility]. While in ED, she was noted to have VT with a pulse and was cardioverted, became asystolic with 10 minutes of CPR before ROSC.  Post CPR, the patient was reportedly awake/ pulling at ETT and was sedated. Pt extubated on 7/7. CXr 7/6: cardiomegaly and interstitial opacities suspicious for pulmonary edema. Possible small left pleural effusion. CT head: Stable trace parafalcine subdural hematoma. BSE 03/2019: WNL. EGD 6/8 showed multiple plagues in the mid and distal esophagus, smal HH, two non-bleeding duodenal ulcers.  Type of Study: Bedside Swallow Evaluation Previous Swallow Assessment: See HPI Diet Prior to this Study: NPO Temperature Spikes Noted: No Respiratory Status: Nasal cannula History of Recent Intubation: Yes Length of Intubations (days): 2 days Date extubated: 08/03/19 Behavior/Cognition: Alert;Cooperative;Pleasant mood Oral Cavity Assessment: Within Functional Limits Oral Care Completed by SLP: Recent completion by staff Oral Cavity - Dentition: Missing dentition Vision: Functional for self-feeding Self-Feeding Abilities: Needs assist Patient Positioning: Upright in bed;Postural control adequate for testing Baseline Vocal  Quality: Breathy (but at baseline) Volitional Cough: Strong Volitional Swallow: Able to elicit    Oral/Motor/Sensory Function Overall Oral Motor/Sensory Function: Within functional limits   Ice Chips Ice chips: Within functional limits Presentation: Spoon   Thin Liquid Thin Liquid: Impaired Presentation: Straw;Cup Pharyngeal  Phase Impairments: Other (comments) (Pt reports pain and the sensation of liquid  "twisting" in her chest as she swallows)    Nectar Thick Nectar Thick Liquid: Not tested   Honey Thick Honey Thick Liquid: Not tested   Puree Puree: Within functional limits Presentation: Spoon   Solid     Solid: Within functional limits Presentation: Demetrius Charity I. Hardin Negus, [place], [place] Office number [phone] Pager [phone]  Horton Marshall 08/04/2019,12:36 PM

## 2019-08-04 NOTE — Plan of Care (Signed)
Discussed with patient care plan for the evening, pain management and medications woith some teach back displayed

## 2019-08-04 NOTE — Evaluation (Signed)
Physical Therapy Evaluation Patient Details Name: Christina [name] MRN: [id number] DOB: [birth year] Today's Date: 08/04/2019   History of Present Illness  Pt is 69 yo female  who admitted 7/5 as transfer from [facility] post asystolic arrest with 10 minute downtime. Hyperkalemia thought to be source of arrest.  Pt does have acute metabolic encephalopathy s/p arrest with trace subdural hematoma along the falx. Pt with PMH of HTN, HLD, CAD, CEA on L, SMA stent May 2021, CKD/ESRD, COPD, GERD, and GIB.  Clinical Impression  Pt admitted with above diagnosis. During PT evaluation pt was found to have profound R sided weakness, edema, and decreased sensation.  Did note mention of trace SDH but no mention of R sided weakness in history.  Discussed with RN who had just gotten pt but was unaware of reports of weakness - she notified medical team (who have ordered further testing).  Pt requiring mod A for stand pivot transfers and unable to ambulate.  Pt was poor historian and unable to provide accurate PLOF - did note at admission 1 month ago she was walking at least 14' with supervision.  Pt will benefit from further PT and further assessment as new test results are available.   Pt currently with functional limitations due to the deficits listed below (see PT Problem List). Pt will benefit from skilled PT to increase their independence and safety with mobility to allow discharge to the venue listed below.       Follow Up Recommendations CIR    Equipment Recommendations  Rolling walker with 5" wheels;Wheelchair (measurements PT);Wheelchair cushion (measurements PT);Other (comment) (needs further assessment; pt poor historian)    Recommendations for Other Services       Precautions / Restrictions Precautions Precautions: Fall      Mobility  Bed Mobility Overal bed mobility: Needs Assistance Bed Mobility: Supine to Sit     Supine to sit: Mod assist     General bed mobility comments: cues  for sequencing and mod A to lift trunk and for R LE  Transfers Overall transfer level: Needs assistance Equipment used:  (pt held therapist arm with her L hand) Transfers: Sit to/from [facility] Sit to Stand: Mod assist Stand pivot transfers: Mod assist       General transfer comment: Mod A stand pivot toward L side with pt holding onto therapist. Cues for hand placement and sequence  Ambulation/Gait             General Gait Details: unable  Stairs            Wheelchair Mobility    Modified Rankin (Stroke Patients Only)       Balance Overall balance assessment: Needs assistance Sitting-balance support: Feet supported;Single extremity supported Sitting balance-Leahy Scale: Poor Sitting balance - Comments: reliant on L UE   Standing balance support: Single extremity supported Standing balance-Leahy Scale: Zero Standing balance comment: reliant on mod-max A for therapist                             Pertinent Vitals/Pain Pain Assessment: Faces Faces Pain Scale: Hurts even more Pain Location: R LE with touch Pain Descriptors / Indicators: Guarding;Grimacing Pain Intervention(s): Limited activity within patient's tolerance;Monitored during session;Repositioned;Relaxation    Home Living Family/patient expects to be discharged to:: Inpatient rehab Living Arrangements: Children Available Help at Discharge: Family;Available 24 hours/day Type of Home: Apartment Home Access: Level entry     Home Layout: One  level Home Equipment: Walker - 4 wheels      Prior Function Level of Independence: Needs assistance      ADL's / Homemaking Assistance Needed: Reports independent with ADLs  Comments: Pt is  poor historian.  At hospitalzation 1 month ago pt demonstrated equal strength bilaterall and was able to ambulate at least 18' with supervision.  Pt report at home she was only able to ambulate 4' at a time and then sit for the past few  weeks.     Hand Dominance        Extremity/Trunk Assessment   Upper Extremity Assessment Upper Extremity Assessment: LUE deficits/detail;RUE deficits/detail RUE Deficits / Details: ROM : PROM WFL; MMT shoulder 1/5, elbow 2/5 but slowly, wrist 2/5 tends to hold in dropped position, fingers 0/5.  Pt with significant edema throughout R UE. RUE Sensation: decreased light touch LUE Deficits / Details: ROM WFL: MMT 4+/5 (generalized weakness) LUE Sensation: WNL    Lower Extremity Assessment Lower Extremity Assessment: LLE deficits/detail;RLE deficits/detail RLE Deficits / Details: ROM WFL ; MMT ankle 3/5, knee 3/5, hip 3/5 but all slow and labored; significant edema throughout leg RLE Sensation: decreased light touch LLE Deficits / Details: ROM WFL: MMT 5/5    Cervical / Trunk Assessment Cervical / Trunk Assessment: Normal  Communication      Cognition Arousal/Alertness: Awake/alert Behavior During Therapy: WFL for tasks assessed/performed Overall Cognitive Status: Impaired/Different from baseline Area of Impairment: Orientation;Problem solving;Awareness;Memory;Following commands;Safety/judgement                 Orientation Level: Disoriented to;Time;Situation   Memory: Decreased short-term memory Following Commands: Follows multi-step commands inconsistently Safety/Judgement: Decreased awareness of safety;Decreased awareness of deficits   Problem Solving: Slow processing;Difficulty sequencing;Requires verbal cues;Requires tactile cues General Comments: Pt confused and poor historian.  Reports a fall and breaking her back as the reason for admission to the hospital.  Also reports that her family doctor told her "she wouldn't be paralyzed."      General Comments General comments (skin integrity, edema, etc.): VSS on 1 LPM O2. Pt was found to have edema and profound weakness in R UE and LE additionally with edema and decreased sensation. Noted pt did trace SDH on CT.  Pt  states weakness present since fall (no fall mentioned in H and P).  No further mention of R sided weakness or hx of CVA noted.  Notified RN who notified PA.    Exercises     Assessment/Plan    PT Assessment Patient needs continued PT services  PT Problem List Decreased coordination;Decreased mobility;Decreased balance;Decreased activity tolerance;Decreased strength;Pain;Decreased knowledge of use of DME;Decreased safety awareness;Impaired sensation;Decreased cognition;Decreased knowledge of precautions       PT Treatment Interventions DME instruction;Gait training;Functional mobility training;Therapeutic activities;Therapeutic exercise;Balance training;Patient/family education;Neuromuscular re-education    PT Goals (Current goals can be found in the Care Plan section)  Acute Rehab PT Goals Patient Stated Goal: "get back to where I was" PT Goal Formulation: With patient Time For Goal Achievement: 08/18/19 Potential to Achieve Goals: Good Additional Goals Additional Goal #1: Pt will increase R LE strength to 4/5 for improved transfers.    Frequency Min 3X/week   Barriers to discharge        Co-evaluation               AM-PAC PT "6 Clicks" Mobility  Outcome Measure Help needed turning from your back to your side while in a flat bed without using bedrails?: A Lot Help needed  moving from lying on your back to sitting on the side of a flat bed without using bedrails?: A Lot Help needed moving to and from a bed to a chair (including a wheelchair)?: A Lot Help needed standing up from a chair using your arms (e.g., wheelchair or bedside chair)?: A Lot Help needed to walk in hospital room?: A Lot Help needed climbing 3-5 steps with a railing? : Total 6 Click Score: 11    End of Session Equipment Utilized During Treatment: Gait belt Activity Tolerance: Patient tolerated treatment well Patient left: with call bell/phone within reach;with chair alarm set;in chair Nurse  Communication: Mobility status;Other (comment) (concern over R weakness, decreased sensation, and edema) PT Visit Diagnosis: Unsteadiness on feet (R26.81);Muscle weakness (generalized) (M62.81);Difficulty in walking, not elsewhere classified (R26.2)    Time: 8453-6468 PT Time Calculation (min) (ACUTE ONLY): 40 min   Charges:   PT Evaluation $PT Eval Moderate Complexity: 1 Mod PT Treatments $Therapeutic Activity: 8-22 mins        Abran Richard, PT Acute Rehab Services Pager [phone] [facility] [phone]    Karlton Lemon 08/04/2019, 5:07 PM

## 2019-08-04 NOTE — Progress Notes (Signed)
Picked up patient from prior Rn 3pm. Patient noted to have significant swelling on right side. PT in shortly after to see patient and noted weakness in right arm and leg. Unsure if this is a new finding so I notified Rahul PA and Dr Carlis Abbott to evaluate. Orders received and awaiting CT/ Xrays at this time. Patient has no complaints and VS as recorded.

## 2019-08-04 NOTE — Progress Notes (Signed)
eLink Physician-Brief Progress Note Patient Name: Christina [name] DOB: 06-14-1950 MRN: [id number]   Date of Service  08/04/2019  HPI/Events of Note  Afib with RVR ( rate 140's)  eICU Interventions  Rhythm confirmed via 12 lead EKG,  Amiodarone bolus + infusion ordered. Ultram 50 mg po Q 6 hours PRN pain ordered.        [name] 08/04/2019, 3:07 AM

## 2019-08-04 NOTE — Progress Notes (Signed)
NAME:  Christina [name], MRN:  [id number], DOB:  1950-07-30, LOS: 3 ADMISSION DATE:  08/01/2019, CONSULTATION DATE:  08/01/19 REFERRING MD:  [facility], CHIEF COMPLAINT:  Cardiac arrest    Brief History   69 y/o F who admitted 7/5 as transfer from [facility] post asystolic arrest with 10 minute downtime. Hyperkalemia thought to be source of arrest / medical non-compliance, hypoglycemic.   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  No CT evidence of anoxic injury ECHO 7/5 >> LVEF 55-60%, no RWMA, interventricular septum is mildly flattened in systole & diastole consistent with RV & volume overload, RV systolic function is mildly reduced.  Severely elevated pulmonary artery systolic pressure, estimated RV systolic pressure is 95.6.  LA mildly dilated, mild to moderate TR CT Head 7/6 >> stable trace parafalcine subdural hematoma, advanced chronic ischemic injury   Micro Data:  BCx2 7/5 >>  Tracheal aspirate 7/5 >>  COVID 7/5 >> negative  MRSA PCR 7/5 >> negative   Antimicrobials:    Interim history/subjective:  Noted documentation from overnight - pt reported chest discomfort from prior compressions Tolerated extubation well, on 4L [place] Afebrile  Tolerated HD, 2L removed on 7/7  Objective   Blood pressure (!) 127/49, pulse 65, temperature  98.4 F (36.9 C), resp. rate 18, height 5\' 2"  (1.575 m), weight 46.6 kg, SpO2 99 %.    Vent Mode: PRVC FiO2 (%):  [30 %-40 %] 30 % Set Rate:  [16 bmp] 16 bmp Vt Set:  [320 mL] 320 mL PEEP:  [5 cmH20] 5 cmH20 Plateau Pressure:  [13 cmH20] 13 cmH20   Intake/Output Summary (Last 24 hours) at 08/04/2019 0825 Last data filed at 08/04/2019 0700 Gross per 24 hour  Intake 219.07 ml  Output 2125 ml  Net -1905.93 ml   Filed Weights   08/03/19 1237 08/03/19 1642 08/04/19 0322  Weight: 49.7 kg 47 kg 46.6 kg    Examination: General: chronically ill appearing adult female lying in bed in NAD   HEENT: MM pink/moist, wearing glasses, anicteric, raspy voice Neuro: Awake/alert, oriented, MAE CV: s1s2 rrr, SR 60's on monitor, no m/r/g PULM: non-labored on [place] O2, lungs bilaterally clear  GI: soft, bsx4 active  Extremities: warm/dry, no edema, LUE with AVF, +thrill / bruit  Skin: thin skin, multiple areas of bruising, RLE with anterior ecchymosis (prior IO catheter from OSH)  Resolved Hospital Problem list   Bleeding R Femoral TLC  Assessment & Plan:   Asystolic Cardiac Arrest  VT  Hx CAD s/p Stent, PVD, AF Suspect in setting of hyperkalemia, ESRD.   -tele monitoring  -ASA, Plavix on hold due to SDH, consider holding for 2 weeks, then restart -resume home amiodarone, lopressor at reduced dose of 25 mg BID (? of 50 vs 100 mg home dosing, pt  was taking 100mg ) -follow BP trends closely, avoid hypo/hypertension -SBP goal 120-140 -stop amiodarone gtt at 1300  Acute Hypoxic Respiratory Failure s/p Arrest  COPD -continue O2 to support sats 88-95% -continue brovana + pulmicort BID  -PRN albuterol  -can transition back to home symbicort at discharge -SLP evaluation  Hyperkalemia  ESRD on HD (T,Th,S) -Appreciate Nephrology, last HD 7/7  -Trend BMP / urinary output -Replace electrolytes as indicated -Avoid nephrotoxic agents, ensure adequate renal perfusion  Acute Metabolic Encephalopathy  s/p Arrest SDH  New SDH identified on CT head post transfer.  Repeat CT 7/6 without change -follow serial neuro exam -hold DAPT therapy for now, see above  -PT efforts / mobilize   Anemia Thrombocytopenia  Suspect element of uremic bleeding, +/- ETOH liver disease but no mention on [place] or prior CT 05/2019 -trend CBC -transfuse for Hgb <7% or active bleeding  Severe Protein Calorie Malnutrition  -advance diet as tolerated -add ensure BID when cleared for diet  Hypoglycemia  Suspect peripheral checks not accurate, does not correlate with check from central line -monitor CBG's -diet as tolerated    Best practice:  Diet: NPO x meds for now Pain/Anxiety/Delirium protocol (if indicated): n/a VAP protocol (if indicated): n/a DVT prophylaxis: SCD's  GI prophylaxis: PPI  Glucose control: CBG q4  Mobility: BR  Code Status: DNR Family Communication: Family updated on patients status 7/7.  Patient updated on plan of care 7/8 Disposition: Transfer to tele, to [place] as of 7/9   Labs   CBC: Recent Labs  Lab 08/01/19 1208 08/01/19 1211 08/01/19 1250 08/01/19 1400 08/02/19 0015 08/03/19 0346 08/04/19 0331  WBC 9.1  --   --  13.4* 17.4* 16.6* 14.9*  NEUTROABS 8.8*  --   --   --   --   --   --   HGB 8.5*   < > 8.8* 8.0* 7.4* 7.2* 8.0*  HCT 28.5*   < > 26.0* 26.7* 25.1* 24.3* 27.0*  MCV 96.3  --   --  95.0 96.9 98.4 100.0  PLT 125*  --   --  113* 102* 101* 120*   < > = values in this interval not displayed.    Basic Metabolic Panel: Recent Labs  Lab 08/01/19 1208 08/01/19 1208 08/01/19 1211 08/01/19 1250 08/01/19 1400 08/02/19 0015 08/02/19 0225 08/02/19 1845 08/03/19 0346 08/03/19 1621 08/03/19 2033 08/04/19 0331  NA 149*  --    < > 144 138 137  --   --  141  --   --  139  K 3.9   < >   < > 3.5 3.5 4.3  --   --  3.3*  --  3.7 3.8  CL 91*  --   --   --  99 95*  --   --  94*  --   --  99  CO2 34*  --   --   --  30 32  --   --  34*  --   --  28  GLUCOSE 297*   < >  --    --  64* 316* 178*  --  166*  --   --  182*  BUN 50*  --   --   --  11 22  --   --  43*  --   --  21  CREATININE 6.34*  --   --   --  1.72* 3.25*  --   --  4.41*  --   --  2.87*  CALCIUM 8.5*  --   --   --  6.9* 7.0*  --   --  7.1*  --   --  8.0*  MG 3.3*  --   --   --   --  1.9  --  1.8 1.8 1.7  --   --   PHOS 8.9*   < >  --   --  2.5 4.8*  --  5.9* 6.3* 2.1*  --   --    < > = values in this interval not displayed.   GFR: Estimated Creatinine Clearance: 13.6 mL/min (A) (by C-G formula based on SCr of 2.87 mg/dL (H)). Recent Labs  Lab 08/01/19 1208 08/01/19 1208 08/01/19 1400 08/01/19 1504 08/02/19 0015 08/03/19 0346 08/04/19 0331  PROCALCITON 0.68  --   --   --   --   --   --   WBC 9.1   < > 13.4*  --  17.4* 16.6* 14.9*  LATICACIDVEN 9.8*  --   --  1.9  --   --   --    < > = values in this interval not displayed.    Liver Function Tests: Recent Labs  Lab 08/01/19 1208 08/01/19 1400 08/03/19 0346  AST 1,044*  --  305*  ALT 561*  --  446*  ALKPHOS 79  --  73  BILITOT 1.6*  --  0.8  PROT 4.5*  --  3.9*  ALBUMIN 2.0* 1.9* 1.7*   No results for input(s): LIPASE, AMYLASE in the last 168 hours. No results for input(s): AMMONIA in the last 168 hours.  ABG    Component Value Date/Time   PHART 7.548 (H) 08/01/2019 1250   PCO2ART 51.1 (H) 08/01/2019 1250   PO2ART 141 (H) 08/01/2019 1250   HCO3 44.8 (H) 08/01/2019 1250   TCO2 46 (H) 08/01/2019 1250   ACIDBASEDEF 2.0 02/04/2019 0527   O2SAT 99.0 08/01/2019 1250     Coagulation Profile: Recent Labs  Lab 08/01/19 1208  INR 1.8*    Cardiac Enzymes: No results for input(s): CKTOTAL, CKMB, CKMBINDEX, TROPONINI in the last 168 hours.  HbA1C: Hgb A1c MFr Bld  Date/Time Value Ref Range Status  06/19/2019 03:12 AM 4.9 4.8 - 5.6 % Final    Comment:    (NOTE) Pre diabetes:          5.7%-6.4% Diabetes:              >6.4% Glycemic control for   <7.0% adults with diabetes   02/04/2019 05:06 AM 5.5 4.8 - 5.6 % Final     Comment:    (NOTE) Pre diabetes:          5.7%-6.4% Diabetes:              >6.4% Glycemic control for   <7.0% adults with diabetes     CBG: Recent Labs  Lab 08/03/19 1624 08/03/19 2000 08/03/19 2343 08/04/19 0342 08/04/19 0808  GLUCAP 111* 112* 114* 154* 161*    Critical care time: n/a      Noe Gens, MSN, NP-C [facility] 08/04/2019, 8:25 AM   Please see [email or web address] for pager details.

## 2019-08-04 NOTE — Progress Notes (Addendum)
PCCM Interval Progress Note  Asked to evaluate pt at bedside for right arm and right leg edema, pain, weakness.  On exam, she certainly has edema throughout right side and is tender to palpation in right forearm and right shin primarily, though somewhat right upper arm and right thigh also.  She also has bruising to right shin.  She is able to move right arm and right leg slightly. She just completed working with PT who noted definite weakness in right arm and right leg.  She has intact sensation.  She has no dysphasia and no facial assymetry.  Tongue is midline and she follows commands appropriately. Stroke / CVA doubtful and highly unlikely.  She tells me she did fall on her right side prior to arrest and is unsure how long she was on the ground for.  Given her history and exam, we should rule out fracture and DVT.   X-Rays of right humerus, right forearm, right femur, right tib/fib ordered as well as RUE and RLE venous duplex.  Defer anticoagulation given small SDH but will add CT head to ensure no worsening SDH.  Hold off on transfer to tele for now until head CT is performed and worsening SDH ruled out.   Montey Hora, [facility] 08/04/2019, 4:56 PM

## 2019-08-04 NOTE — Progress Notes (Signed)
  Amiodarone Drug - Drug Interaction Consult Note  Recommendations: No specific interactions currently. Amiodarone is metabolized by the cytochrome P450 system and therefore has the potential to cause many drug interactions. Amiodarone has an average plasma half-life of 50 days (range 20 to 100 days).   There is potential for drug interactions to occur several weeks or months after stopping treatment and the onset of drug interactions may be slow after initiating amiodarone.   []  Statins: Increased risk of myopathy. Simvastatin- restrict dose to 20mg  daily. Other statins: counsel patients to report any muscle pain or weakness immediately.  []  Anticoagulants: Amiodarone can increase anticoagulant effect. Consider warfarin dose reduction. Patients should be monitored closely and the dose of anticoagulant altered accordingly, remembering that amiodarone levels take several weeks to stabilize.  []  Antiepileptics: Amiodarone can increase plasma concentration of phenytoin, the dose should be reduced. Note that small changes in phenytoin dose can result in large changes in levels. Monitor patient and counsel on signs of toxicity.  []  Beta blockers: increased risk of bradycardia, AV block and myocardial depression. Sotalol - avoid concomitant use.  []   Calcium channel blockers (diltiazem and verapamil): increased risk of bradycardia, AV block and myocardial depression.  []   Cyclosporine: Amiodarone increases levels of cyclosporine. Reduced dose of cyclosporine is recommended.  []  Digoxin dose should be halved when amiodarone is started.  []  Diuretics: increased risk of cardiotoxicity if hypokalemia occurs.  []  Oral hypoglycemic agents (glyburide, glipizide, glimepiride): increased risk of hypoglycemia. Patient's glucose levels should be monitored closely when initiating amiodarone therapy.   []  Drugs that prolong the QT interval:  Torsades de pointes risk may be increased with concurrent use -  avoid if possible.  Monitor QTc, also keep magnesium/potassium WNL if concurrent therapy can't be avoided. Marland Kitchen Antibiotics: e.g. fluoroquinolones, erythromycin. . Antiarrhythmics: e.g. quinidine, procainamide, disopyramide, sotalol. . Antipsychotics: e.g. phenothiazines, haloperidol.  . Lithium, tricyclic antidepressants, and methadone.  Thank You,  Wynona Neat, PharmD, BCPS 08/04/2019 3:18 AM

## 2019-08-04 NOTE — Progress Notes (Signed)
Pt. Assessed for PIV.  Left arm restricted.  No suitable veins in right arm.  Arm very bruised and swollen.

## 2019-08-05 ENCOUNTER — Inpatient Hospital Stay (HOSPITAL_COMMUNITY): Payer: Medicare Other

## 2019-08-05 ENCOUNTER — Encounter (HOSPITAL_COMMUNITY): Payer: Self-pay | Admitting: Pulmonary Disease

## 2019-08-05 DIAGNOSIS — M7989 Other specified soft tissue disorders: Secondary | ICD-10-CM

## 2019-08-05 DIAGNOSIS — E440 Moderate protein-calorie malnutrition: Secondary | ICD-10-CM

## 2019-08-05 DIAGNOSIS — W19XXXD Unspecified fall, subsequent encounter: Secondary | ICD-10-CM

## 2019-08-05 DIAGNOSIS — Z452 Encounter for adjustment and management of vascular access device: Secondary | ICD-10-CM

## 2019-08-05 DIAGNOSIS — I639 Cerebral infarction, unspecified: Secondary | ICD-10-CM

## 2019-08-05 HISTORY — PX: IR IVC FILTER PLMT / S&I /IMG GUID/MOD SED: IMG701

## 2019-08-05 LAB — GLUCOSE, CAPILLARY
Glucose-Capillary: 117 mg/dL — ABNORMAL HIGH (ref 70–99)
Glucose-Capillary: 143 mg/dL — ABNORMAL HIGH (ref 70–99)
Glucose-Capillary: 95 mg/dL (ref 70–99)
Glucose-Capillary: 42 mg/dL — CL (ref 70–99)
Glucose-Capillary: 62 mg/dL — ABNORMAL LOW (ref 70–99)

## 2019-08-05 LAB — COMPREHENSIVE METABOLIC PANEL
ALT: 222 U/L — ABNORMAL HIGH (ref 0–44)
AST: 62 U/L — ABNORMAL HIGH (ref 15–41)
Albumin: 2.1 g/dL — ABNORMAL LOW (ref 3.5–5.0)
Alkaline Phosphatase: 86 U/L (ref 38–126)
Anion gap: 11 (ref 5–15)
BUN: 39 mg/dL — ABNORMAL HIGH (ref 8–23)
CO2: 30 mmol/L (ref 22–32)
Calcium: 8.6 mg/dL — ABNORMAL LOW (ref 8.9–10.3)
Chloride: 96 mmol/L — ABNORMAL LOW (ref 98–111)
Creatinine, Ser: 4.16 mg/dL — ABNORMAL HIGH (ref 0.44–1.00)
GFR calc Af Amer: 12 mL/min — ABNORMAL LOW (ref >60)
GFR calc non Af Amer: 10 mL/min — ABNORMAL LOW (ref >60)
Glucose, Bld: 124 mg/dL — ABNORMAL HIGH (ref 70–99)
Potassium: 4.9 mmol/L (ref 3.5–5.1)
Sodium: 137 mmol/L (ref 135–145)
Total Bilirubin: 1 mg/dL (ref 0.3–1.2)
Total Protein: 5 g/dL — ABNORMAL LOW (ref 6.5–8.1)

## 2019-08-05 LAB — CBC
HCT: 25.7 % — ABNORMAL LOW (ref 36.0–46.0)
Hemoglobin: 7.7 g/dL — ABNORMAL LOW (ref 12.0–15.0)
MCH: 29.5 pg (ref 26.0–34.0)
MCHC: 30 g/dL (ref 30.0–36.0)
MCV: 98.5 fL (ref 80.0–100.0)
Platelets: 129 10*3/uL — ABNORMAL LOW (ref 150–400)
RBC: 2.61 MIL/uL — ABNORMAL LOW (ref 3.87–5.11)
RDW: 19 % — ABNORMAL HIGH (ref 11.5–15.5)
WBC: 17.1 10*3/uL — ABNORMAL HIGH (ref 4.0–10.5)
nRBC: 0.2 % (ref 0.0–0.2)

## 2019-08-05 MED ORDER — DEXTROSE 10 % IV SOLN: INTRAVENOUS | Status: AC

## 2019-08-05 MED ORDER — BACITRACIN-NEOMYCIN-POLYMYXIN OINTMENT TUBE
TOPICAL_OINTMENT | Freq: Two times a day (BID) | CUTANEOUS | Status: DC
  Administered 2019-08-05 – 2019-08-11 (×3): 1 via TOPICAL
  Filled 2019-08-05: qty 14

## 2019-08-05 MED ORDER — LIDOCAINE HCL 1 % IJ SOLN
INTRAMUSCULAR | Status: AC | PRN
  Administered 2019-08-05: 10 mL

## 2019-08-05 MED ORDER — PANTOPRAZOLE SODIUM 40 MG PO TBEC
40.0000 mg | DELAYED_RELEASE_TABLET | Freq: Two times a day (BID) | ORAL | Status: DC
  Administered 2019-08-05 – 2019-08-12 (×14): 40 mg via ORAL
  Filled 2019-08-05 (×14): qty 1

## 2019-08-05 MED ORDER — FENTANYL CITRATE (PF) 100 MCG/2ML IJ SOLN
INTRAMUSCULAR | Status: AC | PRN
  Administered 2019-08-05: 12.5 ug via INTRAVENOUS

## 2019-08-05 MED ORDER — ASPIRIN 81 MG PO CHEW
81.0000 mg | CHEWABLE_TABLET | Freq: Every day | ORAL | Status: DC
  Administered 2019-08-06 – 2019-08-11 (×6): 81 mg via ORAL
  Filled 2019-08-05 (×6): qty 1

## 2019-08-05 MED ORDER — LORAZEPAM 2 MG/ML IJ SOLN
1.0000 mg | Freq: Once | INTRAMUSCULAR | Status: AC | PRN
  Administered 2019-08-05: 1 mg via INTRAVENOUS
  Filled 2019-08-05: qty 1

## 2019-08-05 MED ORDER — DEXTROSE 50 % IV SOLN
INTRAVENOUS | Status: AC
  Administered 2019-08-05: 50 mL
  Filled 2019-08-05: qty 50

## 2019-08-05 MED ORDER — GABAPENTIN 600 MG PO TABS
300.0000 mg | ORAL_TABLET | Freq: Two times a day (BID) | ORAL | Status: DC
  Administered 2019-08-05 – 2019-08-12 (×15): 300 mg via ORAL
  Filled 2019-08-05 (×15): qty 1

## 2019-08-05 MED ORDER — RENA-VITE PO TABS
1.0000 | ORAL_TABLET | Freq: Every day | ORAL | Status: DC
  Administered 2019-08-05 – 2019-08-10 (×6): 1 via ORAL
  Filled 2019-08-05 (×6): qty 1

## 2019-08-05 MED ORDER — IOHEXOL 300 MG/ML  SOLN
100.0000 mL | Freq: Once | INTRAMUSCULAR | Status: AC | PRN
  Administered 2019-08-05: 35 mL via INTRAVENOUS

## 2019-08-05 MED ORDER — FENTANYL CITRATE (PF) 100 MCG/2ML IJ SOLN
INTRAMUSCULAR | Status: AC
  Filled 2019-08-05: qty 2

## 2019-08-05 MED ORDER — LIDOCAINE HCL 1 % IJ SOLN
INTRAMUSCULAR | Status: AC
  Filled 2019-08-05: qty 20

## 2019-08-05 MED ORDER — CARVEDILOL 6.25 MG PO TABS
6.2500 mg | ORAL_TABLET | Freq: Two times a day (BID) | ORAL | Status: DC
  Administered 2019-08-05 – 2019-08-12 (×12): 6.25 mg via ORAL
  Filled 2019-08-05 (×12): qty 1

## 2019-08-05 MED ORDER — CHLORHEXIDINE GLUCONATE CLOTH 2 % EX PADS
6.0000 | MEDICATED_PAD | Freq: Every day | CUTANEOUS | Status: DC
  Administered 2019-08-07 – 2019-08-12 (×2): 6 via TOPICAL

## 2019-08-05 MED ORDER — IOHEXOL 350 MG/ML SOLN
75.0000 mL | Freq: Once | INTRAVENOUS | Status: AC | PRN
  Administered 2019-08-05: 75 mL via INTRAVENOUS

## 2019-08-05 MED ORDER — NEPRO/CARBSTEADY PO LIQD
237.0000 mL | Freq: Three times a day (TID) | ORAL | Status: DC
  Administered 2019-08-05 – 2019-08-11 (×14): 237 mL via ORAL

## 2019-08-05 NOTE — Progress Notes (Addendum)
PROGRESS NOTE                                                                                                                                                                                                             Patient Demographics:    Christina [name], is a 69 y.o. female, DOB - 1950/07/01, YIR:[id number]  Admit date - 08/01/2019   Admitting Physician Garner Nash, DO  Outpatient Primary MD for the patient is Nodal, [name], PA-C  LOS - 4  CC - Passed out     Brief Narrative - 69 year old Caucasian female with history of ESRD with noncompliance with dialysis on multiple occasions, CAD s/p 2 stents in the past, SMA stent in May 2021, left carotid endarterectomy in the past, hypertension, dyslipidemia, alcohol abuse, GI bleed, COPD, GERD, history of duodenal ulcers found on EGD in June 2021, anemia of chronic disease, medication noncompliance who was brought to [facility] ER after she had an asystolic arrest at home with 10-minute downtime requiring CPR thought to be due to hyperkalemia.  She was subsequently intubated and transferred to [facility] under pulmonary critical care, while under the care of pulmonary critical care she developed right-sided weakness arm more than leg on 08/04/2019.  Imaging studies of the brain showed stable small subdural hematoma, she was stabilized extubated and transferred to my service on 08/05/2019 on day 4 of her hospital stay.   Subjective:    Christina [name] today has, No headache, No chest pain, No abdominal pain - No Nausea, No new weakness tingling or numbness, No Cough - SOB. ++ R arm weakness.   Assessment  & Plan :     1.  Cardiac arrest with subsequent respiratory failure due to unresponsive state caused by hyperkalemia due to noncompliance with dialysis.  She has been dialyzed, potassium stable, has been extensively counseled on compliance, also discussed this with her [name] over the phone on 08/05/2019.  Echo  shows stable EF of around 55% with no regional wall motion abnormality.  Continue monitoring with supportive care.  2.  CAD s/p stent in the remote past, history of atrial fibrillation, PAD with history of SMA stent, left carotid endarterectomy.  Will place her on statin and Coreg for now for secondary prevention, antiplatelets held due to recent subdural hematoma.  3.  ESRD.  TTS schedule.  Nephrology on board.  Counseled on compliance.  4.  Acute metabolic encephalopathy post cardiac arrest.  Improving.  5.  Recent subdural  hematoma on CT scan, new right arm weakness x 2 days.  Neurology consulted, MRI brain along with CT angiogram head and neck ordered.  Statin for secondary prevention, platelet treatment once cleared by neurology in relation to subdural hematoma.  6.  Essential hypertension.  Placed on Coreg.  Will monitor and adjust.  Once ischemic infarct is ruled out will be more aggressive with blood pressure control.  7.  Anemia and chronic thrombocytopenia.  Also has history of duodenal GI bleed in the past, likely has alcoholic cirrhosis as well.  Supportive care and monitor.  Type screen has been done.  8.  Severe PCM.  Supportive care with nutritional supplements.  9.  History of paroxysmal A. fib.  On beta-blocker.  Severely noncompliant with medications.  Mali vas 2 score of greater than 4.  Not on anticoagulation prior to this admission likely due to severe noncompliance along with history of recent upper GI bleed.  Currently has new SDH as well.  10.  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  Cannot fully anticoagulate due to recent subdural hematoma and recent GI bleed.  Also very noncompliant with medications.  Will request IVC filter placement through IR.    Condition - Extremely Guarded  Family Communication  :  [name] [phone] on 08/05/2019  Code Status :  DNR  Consults  :  PCCM, Renal, Neuro  Procedures  :    CT Head 7/5 >>  trace acute subdural hematoma along the falx, no other acute intracranial findings.  No CT evidence of anoxic injury  ECHO 7/5 >> LVEF 55-60%, no RWMA, interventricular septum is mildly flattened in systole & diastole consistent with RV & volume overload, RV systolic function is mildly reduced.  Severely elevated pulmonary artery systolic pressure, estimated RV systolic pressure is 96.7.  LA mildly dilated, mild to moderate TR  CT Head 7/6 & 7/7 >> stable trace parafalcine subdural hematoma, advanced chronic ischemic injury  ETT 7/5 >> 7/7   R Fem TLC 7/5 >> 7/5  L IJ CL 7/5 >>  Venous ultrasound.  Multiple right leg DVTs.  Likely subacute.    MRI brain - Numerous small acute infarcts involving multiple vascular territories bilaterally suggesting a central embolic etiology.  Advanced chronic microvascular ischemic changes and multiple chronic infarcts.   CT angiogram head and neck. No substantial change since 06/19/2019. There is irregular noncalcified plaque along the aortic arch. Stable findings of stenosis including left common carotid artery, intracranial right vertebral artery, intracranial ICAs, and left A2 ACA. Persistent pleural effusions and associated atelectasis.   PUD Prophylaxis : PPI  Disposition Plan  :    Status is: Inpatient  Remains inpatient appropriate because:Unsafe d/c plan, work-up for CVA.   Dispo: The patient is from: Home              Anticipated d/c is to: SNF              Anticipated d/c date is: > 3 days              Patient currently is not medically stable to d/c.  DVT Prophylaxis  :    SCDs    Lab Results  Component Value Date   PLT 129 (L) 08/05/2019    Diet :  Diet Order            Diet regular Room service appropriate? Yes with Assist; Fluid consistency: Thin  Diet effective now  Inpatient Medications Scheduled Meds: . amiodarone  200 mg Oral BID  . arformoterol  15 mcg Nebulization BID  . budesonide (PULMICORT)  nebulizer solution  0.5 mg Nebulization BID  . carvedilol  6.25 mg Oral BID WC  . chlorhexidine gluconate (MEDLINE KIT)  15 mL Mouth Rinse BID  . Chlorhexidine Gluconate Cloth  6 each Topical Q0600  . feeding supplement (NEPRO CARB STEADY)  237 mL Oral TID BM  . gabapentin  300 mg Oral BID  . insulin aspart  0-6 Units Subcutaneous Q4H  . mouth rinse  15 mL Mouth Rinse BID  . multivitamin  1 tablet Oral QHS  . neomycin-bacitracin-polymyxin   Topical BID  . pantoprazole (PROTONIX) IV  40 mg Intravenous QHS  . sodium chloride flush  10-40 mL Intracatheter Q12H   Continuous Infusions: . sodium chloride    . sodium chloride     PRN Meds:.sodium chloride, sodium chloride, acetaminophen, albuterol, alteplase, docusate sodium, guaiFENesin, hydrALAZINE, lidocaine (PF), lidocaine-prilocaine, midodrine, ondansetron (ZOFRAN) IV, pentafluoroprop-tetrafluoroeth, polyethylene glycol, sodium chloride flush, traMADol  Antibiotics  :   Anti-infectives (From admission, onward)   None          Objective:   Vitals:   08/05/19 0541 08/05/19 0542 08/05/19 0600 08/05/19 0743  BP: 119/75   127/74  Pulse: 68 67 67 74  Resp: (!) _0 Temp:    97.7 F (36.5 C)  TempSrc:    Oral  SpO2: 100% 100% 99% 97%  Weight:      Height:        SpO2: 97 % O2 Flow Rate (L/min): 2 L/min FiO2 (%): 30 %  Wt Readings from Last 3 Encounters:  08/05/19 48.6 kg  07/14/19 40.5 kg  06/20/19 50.8 kg     Intake/Output Summary (Last 24 hours) at 08/05/2019 1045 Last data filed at 08/05/2019 0700 Gross per 24 hour  Intake 445.65 ml  Output --  Net 445.65 ml     Physical Exam  Awake Alert, strength in right arm 1/5, [place].AT,PERRAL Supple Neck,No JVD, No cervical lymphadenopathy appriciated.  Symmetrical Chest wall movement, Good air movement bilaterally, CTAB RRR,No Gallops,Rubs or new Murmurs, No Parasternal Heave +ve B.Sounds, Abd Soft, No tenderness, No organomegaly appriciated, No rebound -  guarding or rigidity. Cyanosis in both feet with multiple toes cyanosed and has a chronic multiple dry black toe tips, large bruise anterior part of the right middle leg    Data Review:    Recent Labs  Lab 08/01/19 1208 08/01/19 1211 08/01/19 1400 08/02/19 0015 08/03/19 0346 08/04/19 0331 08/05/19 0500  WBC 9.1   < > 13.4* 17.4* 16.6* 14.9* 17.1*  HGB 8.5*   < > 8.0* 7.4* 7.2* 8.0* 7.7*  HCT 28.5*   < > 26.7* 25.1* 24.3* 27.0* 25.7*  PLT 125*   < > 113* 102* 101* 120* 129*  MCV 96.3   < > 95.0 96.9 98.4 100.0 98.5  MCH 28.7   < > 28.5 28.6 29.1 29.6 29.5  MCHC 29.8*   < > 30.0 29.5* 29.6* 29.6* 30.0  RDW 17.2*   < > 17.2* 17.2* 17.6* 18.3* 19.0*  LYMPHSABS 0.2*  --   --   --   --   --   --   MONOABS 0.1  --   --   --   --   --   --   EOSABS 0.0  --   --   --   --   --   --  BASOSABS 0.0  --   --   --   --   --   --    < > = values in this interval not displayed.    Recent Labs  Lab 08/01/19 1208 08/01/19 1211 08/01/19 1400 08/01/19 1400 08/02/19 0015 08/02/19 0225 08/02/19 1845 08/03/19 0346 08/03/19 1621 08/03/19 2033 08/04/19 0331 08/05/19 0500  NA 149*   < > 138  --  137  --   --  141  --   --  139 137  K 3.9   < > 3.5   < > 4.3  --   --  3.3*  --  3.7 3.8 4.9  CL 91*   < > 99  --  95*  --   --  94*  --   --  99 96*  CO2 34*   < > 30  --  32  --   --  34*  --   --  28 30  GLUCOSE 297*   < > 64*   < > 316* 178*  --  166*  --   --  182* 124*  BUN 50*   < > 11  --  22  --   --  43*  --   --  21 39*  CREATININE 6.34*   < > 1.72*  --  3.25*  --   --  4.41*  --   --  2.87* 4.16*  CALCIUM 8.5*   < > 6.9*  --  7.0*  --   --  7.1*  --   --  8.0* 8.6*  AST 1,044*  --   --   --   --   --   --  305*  --   --   --  62*  ALT 561*  --   --   --   --   --   --  446*  --   --   --  222*  ALKPHOS 79  --   --   --   --   --   --  73  --   --   --  86  BILITOT 1.6*  --   --   --   --   --   --  0.8  --   --   --  1.0  ALBUMIN 2.0*  --  1.9*  --   --   --   --  1.7*  --   --    --  2.1*  MG 3.3*  --   --   --  1.9  --  1.8 1.8 1.7  --   --   --   PROCALCITON 0.68  --   --   --   --   --   --   --   --   --   --   --   INR 1.8*  --   --   --   --   --   --   --   --   --   --   --   BNP >4,500.0*  --   --   --   --   --   --   --   --   --   --   --    < > = values in this interval not displayed.    Recent Labs  Lab 08/01/19 1208  BNP >4,500.0*  PROCALCITON 0.68  SARSCOV2NAA NEGATIVE    ------------------------------------------------------------------------------------------------------------------  No results for input(s): CHOL, HDL, LDLCALC, TRIG, CHOLHDL, LDLDIRECT in the last 72 hours.  Lab Results  Component Value Date   HGBA1C 4.9 06/19/2019   ------------------------------------------------------------------------------------------------------------------ No results for input(s): TSH, T4TOTAL, T3FREE, THYROIDAB in the last 72 hours.  Invalid input(s): FREET3 ------------------------------------------------------------------------------------------------------------------ No results for input(s): VITAMINB12, FOLATE, FERRITIN, TIBC, IRON, RETICCTPCT in the last 72 hours.  Coagulation profile Recent Labs  Lab 08/01/19 1208  INR 1.8*    No results for input(s): DDIMER in the last 72 hours.  Cardiac Enzymes No results for input(s): CKMB, TROPONINI, MYOGLOBIN in the last 168 hours.  Invalid input(s): CK ------------------------------------------------------------------------------------------------------------------    Component Value Date/Time   BNP >4,500.0 (H) 08/01/2019 1208    Micro Results Recent Results (from the past 240 hour(s))  SARS Coronavirus 2 by RT PCR (hospital order, performed in [facility] hospital lab) Nasopharyngeal Nasopharyngeal Swab     Status: None   Collection Time: 08/01/19 12:08 PM   Specimen: Nasopharyngeal Swab  Result Value Ref Range Status   SARS Coronavirus 2 NEGATIVE NEGATIVE Final    Comment:  (NOTE) SARS-CoV-2 target nucleic acids are NOT DETECTED.  The SARS-CoV-2 RNA is generally detectable in upper and lower respiratory specimens during the acute phase of infection. The lowest concentration of SARS-CoV-2 viral copies this assay can detect is 250 copies / mL. A negative result does not preclude SARS-CoV-2 infection and should not be used as the sole basis for treatment or other patient management decisions.  A negative result may occur with improper specimen collection / handling, submission of specimen other than nasopharyngeal swab, presence of viral mutation(s) within the areas targeted by this assay, and inadequate number of viral copies (<250 copies / mL). A negative result must be combined with clinical observations, patient history, and epidemiological information.  Fact Sheet for Patients:   StrictlyIdeas.no  Fact Sheet for Healthcare Providers: BankingDealers.co.za  This test is not yet approved or  cleared by the [place] FDA and has been authorized for detection and/or diagnosis of SARS-CoV-2 by FDA under an Emergency Use Authorization (EUA).  This EUA will remain in effect (meaning this test can be used) for the duration of the COVID-19 declaration under Section 564(b)(1) of the Act, 21 U.S.C. section 360bbb-3(b)(1), unless the authorization is terminated or revoked sooner.  Performed at [facility], [place] [address] [place], [place] [address]   Culture, blood (Routine X 2) w Reflex to ID Panel     Status: None (Preliminary result)   Collection Time: 08/01/19 12:44 PM   Specimen: BLOOD RIGHT ARM  Result Value Ref Range Status   Specimen Description BLOOD RIGHT ARM  Final   Special Requests   Final    BOTTLES DRAWN AEROBIC ONLY Blood Culture results may not be optimal due to an inadequate volume of blood received in culture bottles   Culture   Final    NO GROWTH 3 DAYS Performed at [facility], [place] [address], [place], [place] [address]    Report Status PENDING  Incomplete  MRSA PCR Screening     Status: None   Collection Time: 08/01/19 12:45 PM  Result Value Ref Range Status   MRSA by PCR NEGATIVE NEGATIVE Final    Comment:        The GeneXpert MRSA Assay (FDA approved for NASAL specimens only), is one component of a comprehensive MRSA colonization surveillance program. It is not intended to diagnose MRSA infection nor to guide or monitor treatment  for MRSA infections. Performed at [facility], [place] [address], [place], [place] [address]   Culture, blood (Routine X 2) w Reflex to ID Panel     Status: None (Preliminary result)   Collection Time: 08/01/19 12:48 PM   Specimen: BLOOD RIGHT WRIST  Result Value Ref Range Status   Specimen Description BLOOD RIGHT WRIST  Final   Special Requests   Final    BOTTLES DRAWN AEROBIC ONLY Blood Culture adequate volume   Culture   Final    NO GROWTH 3 DAYS Performed at [facility], [address] [place], [place] [address]    Report Status PENDING  Incomplete  Culture, respiratory (non-expectorated)     Status: None   Collection Time: 08/01/19  3:59 PM   Specimen: Tracheal Aspirate; Respiratory  Result Value Ref Range Status   Specimen Description TRACHEAL ASPIRATE  Final   Special Requests Normal  Final   Gram Stain   Final    FEW WBC PRESENT, PREDOMINANTLY PMN ABUNDANT GRAM POSITIVE COCCI RARE GRAM NEGATIVE RODS RARE GRAM POSITIVE RODS    Culture   Final    FEW Consistent with normal respiratory flora. Performed at [facility], [place] [address] [place], [place] [address]    Report Status 08/04/2019 FINAL  Final    Radiology Reports DG Chest 1 View  Result Date: 08/02/2019 CLINICAL DATA:  Central line placement. EXAM: CHEST  1 VIEW COMPARISON:  08/01/2019.  CT 05/22/2019. FINDINGS: Interim placement left subclavian line, its tip is at the cavoatrial junction. Endotracheal tube and NG tube  in stable position. Cardiomegaly. No pulmonary venous congestion. Mild bilateral interstitial prominence again noted. A component of these changes may be chronic however interstitial edema and/or pneumonitis can not be excluded. Tiny left pleural effusion. No pneumothorax. Surgical clips noted over the neck. IMPRESSION: 1. Interim placement of left subclavian line, its tip is at the cavoatrial junction. Endotracheal tube and NG tube in stable position. 2. Cardiomegaly. No pulmonary venous congestion. Mild bilateral interstitial prominence again noted. A component of these changes may be chronic however interstitial edema and/or pneumonitis cannot be excluded. Tiny left pleural effusion. Electronically Signed   By: Marcello Moores  Register   On: 08/02/2019 06:10   DG Forearm Right  Result Date: 08/04/2019 CLINICAL DATA:  Fall, right-sided weakness EXAM: RIGHT FOREARM - 2 VIEW COMPARISON:  None. FINDINGS: No fracture or malalignment. Fat pad distention at the elbow consistent with an effusion. Prominent posterior elbow soft tissue swelling. Probable cartilaginous calcification at the wrist. IMPRESSION: No definitive fracture is seen, however there is an elbow effusion and occult fracture cannot be excluded. Electronically Signed   By: Donavan Foil M.D.   On: 08/04/2019 19:33   DG Tibia/Fibula Right  Result Date: 08/04/2019 CLINICAL DATA:  [place] [address] during PT profound right-sided weakness EXAM: RIGHT TIBIA AND FIBULA - 2 VIEW COMPARISON:  None. FINDINGS: No acute displaced fracture or malalignment. Vascular calcifications. Soft tissues are unremarkable. IMPRESSION: No acute osseous abnormality. Electronically Signed   By: Donavan Foil M.D.   On: 08/04/2019 19:30   CT HEAD WO CONTRAST  Result Date: 08/04/2019 CLINICAL DATA:  New right-sided weakness EXAM: CT HEAD WITHOUT CONTRAST TECHNIQUE: Contiguous axial images were obtained from the base of the skull through the vertex without intravenous contrast. COMPARISON:   08/02/2019 FINDINGS: Brain: There is no acute intracranial hemorrhage, mass effect, or edema. There is no definite new loss of gray-white differentiation. As before, there are multifocal chronic  infarcts involving bilateral cerebral hemispheres including deep gray nuclei and bilateral cerebellar hemispheres. Additional patchy and confluent hypoattenuation in the supratentorial white matter likely reflects advanced chronic microvascular ischemic change. Prominence of the ventricles and sulci reflects stable parenchymal volume loss. Vascular: There is atherosclerotic calcification at the skull base. Skull: Calvarium is unremarkable. Sinuses/Orbits: No acute finding. Other: None. IMPRESSION: No acute intracranial hemorrhage or evidence of acute infarction. Small new infarct difficult to exclude due to extensive chronic ischemic changes. Electronically Signed   By: Macy Mis M.D.   On: 08/04/2019 19:39   CT HEAD WO CONTRAST  Result Date: 08/02/2019 CLINICAL DATA:  Follow-up subdural hematoma EXAM: CT HEAD WITHOUT CONTRAST TECHNIQUE: Contiguous axial images were obtained from the base of the skull through the vertex without intravenous contrast. COMPARISON:  Yesterday FINDINGS: Brain: Trace high-density thickening left parafalcine at the vertex, not progressed. This is convincing for subdural hemorrhage when compared with a 06/17/2019 head CT. Extensive chronic small vessel ischemic injury with confluent gliosis in the cerebral white matter. Small remote bilateral cerebellar infarcts. Patchy remote cortical infarcts along the cerebral convexities. Chronic lacune at the right caudate head. No hydrocephalus or masslike finding Vascular: No hyperdense vessel or unexpected calcification. Skull: Normal. Negative for fracture or focal lesion. Sinuses/Orbits: No acute finding. IMPRESSION: 1. Stable trace parafalcine subdural hematoma. 2. Advanced chronic ischemic injury. Electronically Signed   By: Monte Fantasia M.D.    On: 08/02/2019 06:14   CT HEAD WO CONTRAST  Addendum Date: 08/01/2019   ADDENDUM REPORT: 08/01/2019 13:43 ADDENDUM: Study discussed by telephone with Pulmonary/Critical Care Dr. Valeta Harms on 08/01/2019 at 1339 hours. Electronically Signed   By: Genevie Ann M.D.   On: 08/01/2019 13:43   Result Date: 08/01/2019 CLINICAL DATA:  69 year old female status post cardiac arrest. Dialysis patient. EXAM: CT HEAD WITHOUT CONTRAST TECHNIQUE: Contiguous axial images were obtained from the base of the skull through the vertex without intravenous contrast. COMPARISON:  CTA head and neck 06/19/2019. Head CT 06/17/2019 and earlier. FINDINGS: Brain: Chronic encephalomalacia in the left frontal lobe, left parietal lobe, right greater than left occipital lobes. Superimposed confluent bilateral white matter hypodensity. Chronic hypodensity scattered in the bilateral deep gray matter nuclei. Small chronic cerebellar infarcts again noted. No evidence of acute cerebral edema. No intracranial mass effect. However, there does appear to be a small or trace volume of subdural hematoma along the falx (coronal image 35). But no other acute intracranial hemorrhage identified. Vascular: Calcified atherosclerosis at the skull base. Skull: No acute osseous abnormality identified. Sinuses/Orbits: Visualized paranasal sinuses and mastoids are stable and well pneumatized. Other: Small volume of fluid in the visible pharynx with oral endotracheal and enteric tubes visible. No acute orbit or scalp soft tissue finding. IMPRESSION: 1. Trace acute subdural hematoma along the falx. But no other acute intracranial hemorrhage and no intracranial mass effect. 2. No CT evidence of anoxic injury. Advanced chronic ischemic disease appears stable from the CTA on 06/19/2019. Electronically Signed: By: Genevie Ann M.D. On: 08/01/2019 13:28   PERIPHERAL VASCULAR CATHETERIZATION  Result Date: 07/13/2019 . Patient name: TIEARA FLITTON MRN: [id number] DOB: [birth year] Sex:  female 07/12/2019 Pre-operative Diagnosis: right leg rest pain Post-operative diagnosis:  Same Surgeon:  Annamarie Major Procedure Performed:  1.  Ultrasound-guided access, left femoral artery  2.  Shockwave intra-arterial lithotripsy, right superficial femoral artery  3.  Drug-coated balloon angioplasty, right superficial femoral artery  4.  Conscious sedation, 52 minutes  Indications: The patient underwent last week with intervention  of the left leg.  She comes back today for right leg intervention. Procedure:  The patient was identified in the holding area and taken to room 8.  The patient was then placed supine on the table and prepped and draped in the usual sterile fashion.  A time out was called.  Conscious sedation was administered with the use of IV fentanyl and Versed under continuous physician and nurse monitoring.  Heart rate, blood pressure, and oxygen saturation were continuously monitored.  Total sedation time was 52 minutes.  Ultrasound was used to evaluate the left common femoral artery.  It was patent .  A digital ultrasound image was acquired.  A micropuncture needle was used to access the left common femoral artery under ultrasound guidance.  An 018 wire was advanced without resistance and a micropuncture sheath was placed.  The 018 wire was removed and a benson wire was placed.  The micropuncture sheath was exchanged for a 6 French 45 cm Terumo sheath.  A versa core wire was advanced over the aortic bifurcation which was followed by the 6 French sheath which was placed into the right superficial femoral artery.  The patient was then fully heparinized.  Next, right leg runoff was performed Findings:  Right Lower Extremity: Right common femoral and profundofemoral artery widely patent.  The superficial femoral artery is patent however the proximal third of the artery has luminal narrowing draining then 50%.  At the adductor canal there is a short segment lesion which is heavily calcified with greater  than 70% stenosis.  The anterior tibial is the dominant runoff vessel.  Intervention: At this point, the patient was fully heparinized.  I advanced a 014 wire across the lesions into the below-knee popliteal artery.  I selected a 4 x 40 shockwave lithotripsy balloon and perform balloon angioplasty with lithotripsy in the superficial femoral artery for lesion length of approximately 200 cm.  Once this was completed at 2 and 4 atm on the balloon, a Ranger 4 x 200 balloon was inserted and drug-coated balloon angioplasty was performed for 3 minutes.  Completion imaging showed inline flow with residual stenosis less than 10%.  There was a nonflow limiting dissection at the proximal and distal edge of the intervention which I elected to monitor.  I did not want to put a stent in because of the small size of her arteries.  At this point sheath and wire were removed.  The patient particularly here for sheath electrocoagulation profile correct Impression:  #1 successful shockwave intra-arterial lithotripsy and subsequent drug-coated balloon angioplasty using 4 mm balloons to the right superficial femoral artery with minimal residual stenosis.  There was a nonflow limiting dissection that was not intervened on.  Theotis Burrow, M.D., [facility] [place] Office: [phone] Pager:  [phone]  PERIPHERAL VASCULAR CATHETERIZATION  Result Date: 07/08/2019 Patient name: TALANI BRAZEE MRN: [id number] DOB: [birth year] Sex: female 07/08/2019 Pre-operative Diagnosis: Severe bilateral PAD Post-operative diagnosis:  Same Surgeon:  Annamarie Major Procedure Performed:  1.  Ultrasound-guided access, right femoral artery  2.  Abdominal aortogram  3.  Bilateral lower extremity runoff  4.  Shockwave intra-arterial lithotripsy, left superficial femoral artery  5.  Shockwave intra-arterial lithotripsy, left common iliac artery  6.  Drug-coated balloon angioplasty, left superficial femoral artery, left  common iliac artery  7.  Stent, Left common iliac artery  8.  Stent, left external iliac artery  9.  Conscious sedation 122 minutes Indications: The patient has severe bilateral  atherosclerotic vascular disease with ulceration of the left toes and rest pain.  She is here today for further evaluation, possible intervention Procedure:  The patient was identified in the holding area and taken to room 8.  The patient was then placed supine on the table and prepped and draped in the usual sterile fashion.  A time out was called.  Conscious sedation was administered with the use of IV fentanyl and Versed under continuous physician and nurse monitoring.  Heart rate, blood pressure, and oxygen saturation were continuously monitored.  Total sedation time was 122 minutes.  Ultrasound was used to evaluate the right common femoral artery.  It was patent .  A digital ultrasound image was acquired.  A micropuncture needle was used to access the right common femoral artery under ultrasound guidance.  An 018 wire was advanced without resistance and a micropuncture sheath was placed.  The 018 wire was removed and a benson wire was placed.  The micropuncture sheath was exchanged for a 5 french sheath.  An omniflush catheter was advanced over the wire to the level of L-1.  An abdominal angiogram was obtained.  Next, the catheter was pulled out of the aortic bifurcation and bilateral runoff was performed. Findings:  Aortogram: Bilateral renal arteries are widely patent.  The infrarenal abdominal aorta is heavily calcified.  There are areas of hypodensity in the mid infrarenal abdominal aorta.  Pressure gradients across this area were checked and found to not be significant.  The right common iliac artery is heavily calcified and irregular however no hemodynamically significant stenosis was identified.  The left common iliac artery has approximately a 50% stenosis at its origin and a 70% stenosis in the midportion.  There was also a  greater than 50% stenosis at the origin of the left external iliac artery  Right Lower Extremity: The right common femoral and profundofemoral artery heavily calcified but patent.  The superficial femoral artery is patent with a greater than 90% stenosis in the midportion as well as a approximate 50% stenosis in the adductor canal.  The anterior tibial is the dominant runoff across the ankle however it does have a abrupt cut off as it crosses the ankle.  The peroneal artery is patent throughout its course.  The posterior tibial artery is occluded  Left Lower Extremity: The left common femoral and profundofemoral artery are calcified but patent without stenosis.  The superficial femoral artery is small in caliber.  There is a greater than 90% stenosis in the adductor canal.  The popliteal artery is widely patent.  There is two-vessel runoff via the posterior tibial and peroneal artery.  The anterior tibial artery is occluded. Intervention: After the above images were acquired the decision made to proceed with intervention.  A 6 French 45 cm sheath was advanced into the left external iliac artery.  The patient was fully heparinized.  Using a 014 wire and a quick cross catheter the lesion in the left superficial femoral artery was crossed.  I then performed shockwave intra-arterial lithotripsy of this lesion for a total of 150 seconds using a 4 x 40 balloon.  I then performed drug-coated balloon angioplasty of this lesion using a 4 x 40 impact balloon.  The balloon was held for 2 atm.  Completion imaging showed a nonflow limiting dissection and also a small intimal flap.  I then reinserted the 4 x 40 balloon and repeated balloon angioplasty at a low atmospheres for 3 minutes.  Follow-up imaging showed near resolution  of the intimal flap and improved appearance of the nonflow limiting dissection. Next attention was turned towards the common iliac artery.  I performed shockwave intra-arterial lithotripsy of this heavily  calcified lesion using the same 4 x 40 balloon.  A total of 120 seconds were performed.  I then used a Ranger 6 x 40 drug-coated balloon for 3 minutes at nominal pressure.  Completion imaging showed persistent stenosis in this area and so I elected to primarily stent this.  This was done with a Herculink 8 x 22.  Follow-up imaging showed resolution of the common iliac stenosis however now the origin of the external iliac artery appeared greater than 70% and I felt that it needed to be addressed and so this was stented using a Abbott self-expanding 8 x 40 stent which was postdilated with a 7 mm balloon.  Completion imaging showed resolution of the stenosis within the left iliac system.  Catheters and wires were removed.  The long 6 French sheath was exchanged out for a short 7 [place] sheath.  The patient be taken the holding area for sheath pull once her coagulation profile correct. Impression:  #1 greater than 70% left common iliac artery stenosis treated with shockwave intra-arterial lithotripsy, drug-coated balloon, and ultimate stenting using an 8 x 22 balloon expandable stent.  #2 proximal left external iliac artery stenosis successfully treated using an 8 x 40 self-expanding stent  #3 near total occlusion of the superficial femoral artery on the left at the adductor canal treated using shockwave intra-arterial lithotripsy and drug-coated balloon angioplasty with a 4 mm balloon.  #4 two-vessel runoff via the posterior tibial and peroneal artery on the left  #5 there are tandem lesions that are hemodynamically significant in the right superficial femoral artery with two-vessel runoff.  6.  The patient will be brought back for percutaneous intervention of the right leg when she recovers from this procedure. Theotis Burrow, M.D., [facility] [place] Office: [phone] Pager:  [phone]  IR Removal Tun Cv Cath W/O FL  Result Date: 07/14/2019 INDICATION: End-stage renal disease  on hemodialysis. Now with functional arteriovenous fistula. Request for removal hemodialysis catheter which was placed by vascular surgery. EXAM: REMOVAL OF TUNNELED HEMODIALYSIS CATHETER MEDICATIONS: 1% lidocaine 3 mL COMPLICATIONS: None immediate. PROCEDURE: Informed written consent was obtained from the patient following an explanation of the procedure, risks, benefits and alternatives to treatment. A time out was performed prior to the initiation of the procedure. Maximal barrier sterile technique was utilized including caps, mask, sterile gowns, sterile gloves, large sterile drape, hand hygiene, and ChloraPrep. 1% lidocaine with epinephrine was injected under sterile conditions along the subcutaneous tunnel. Utilizing a combination of blunt dissection and gentle traction, the catheter was removed intact. Hemostasis was obtained with manual compression. A dressing was placed. The patient tolerated the procedure well without immediate post procedural complication. IMPRESSION: Successful removal of tunneled dialysis catheter. Read by: Gareth Eagle, PA-C Electronically Signed   By: Lucrezia Europe M.D.   On: 07/14/2019 11:51   DG CHEST PORT 1 VIEW  Result Date: 08/02/2019 CLINICAL DATA:  Endotracheal tube placement. EXAM: PORTABLE CHEST 1 VIEW COMPARISON:  Earlier this day. FINDINGS: Endotracheal tube tip 4.8 cm from the carina. Tip and side port of the enteric tube below the diaphragm in the stomach. Left subclavian central line unchanged in the SVC. Stable cardiomegaly. Unchanged mediastinal contours with aortic atherosclerosis. Interstitial thickening suspicious for pulmonary edema, similar. Minimal retrocardiac opacity and blunting of the costophrenic  angle. There is no pneumothorax. IMPRESSION: 1. Endotracheal tube tip 4.8 cm from the carina. Enteric tube in place. 2. Unchanged cardiomegaly and interstitial opacities suspicious for pulmonary edema. Possible small left pleural effusion. Electronically Signed   By:  Keith Rake M.D.   On: 08/02/2019 18:59   DG Chest Port 1 View  Result Date: 08/01/2019 CLINICAL DATA:  Intubation.  Assess for pneumothorax. EXAM: PORTABLE CHEST 1 VIEW COMPARISON:  August 01, 2019 7:58 a.m. FINDINGS: Endotracheal tube is identified 5.2 cm from carina. There is no pleural line to suggest pneumothorax. Nasogastric tube is identified distal tip not included on film but is at least in the stomach. Mild diffuse increased interstitium is identified bilaterally. Patchy opacities of both lung bases are noted. The heart size is mildly enlarged. The bony structures are stable. IMPRESSION: 1. Endotracheal tube 5.2 cm from carina. No pneumothorax. 2. Mild pulmonary edema. 3. Patchy opacities of both lung bases, superimposed pneumonia is not excluded. Electronically Signed   By: Abelardo Diesel M.D.   On: 08/01/2019 13:05   DG Humerus Right  Result Date: 08/04/2019 CLINICAL DATA:  Right-sided weakness history of fall EXAM: RIGHT HUMERUS - 2+ VIEW COMPARISON:  None. FINDINGS: There is no evidence of fracture or other focal bone lesions. Soft tissues are unremarkable. IMPRESSION: Negative. Electronically Signed   By: Donavan Foil M.D.   On: 08/04/2019 19:31   ECHOCARDIOGRAM COMPLETE  Result Date: 08/01/2019    ECHOCARDIOGRAM REPORT   Patient Name:   ROXI HLAVATY Date of Exam: 08/01/2019 Medical Rec #:  [id number]          Height:       62.0 in Accession #:    [id number]         Weight:       89.3 lb Date of Birth:  01-Dec-1950           BSA:          1.357 m Patient Age:    62 years           BP:           153/69 mmHg Patient Gender: F                  HR:           49 bpm. Exam Location:  Inpatient Procedure: 2D Echo, Cardiac Doppler and Color Doppler Indications:    Cardiac arrest ([place]) [427.5.ICD-9-CM]  History:        Patient has prior history of Echocardiogram examinations, most                 recent 06/20/2019. CAD, Stroke and COPD, Arrythmias:Atrial                 Fibrillation,  Signs/Symptoms:Chest Pain; Risk                 Factors:Hypertension, Dyslipidemia and Former Smoker.  Sonographer:    Vickie Epley RDCS Referring Phys: [id number] GRACE E BOWSER  Sonographer Comments: Echo performed with patient supine and on artificial respirator. IMPRESSIONS  1. Left ventricular ejection fraction, by estimation, is 55 to 60%. The left ventricle has normal function. The left ventricle has no regional wall motion abnormalities. There is the interventricular septum is mildly flattened in systole and diastole, consistent with right ventricular pressure and volume overload.  2. Right ventricular systolic function is mildly reduced. The right ventricular size is normal. There is severely elevated pulmonary artery systolic pressure. The  estimated right ventricular systolic pressure is 78.2 mmHg.  3. Left atrial size was mildly dilated.  4. The mitral valve is abnormal. Mild mitral valve regurgitation.  5. Tricuspid valve regurgitation is mild to moderate.  6. The aortic valve is tricuspid. Aortic valve regurgitation is trivial. Mild aortic valve sclerosis is present, with no evidence of aortic valve stenosis.  7. The inferior vena cava is normal in size with <50% respiratory variability, suggesting right atrial pressure of 8 mmHg. FINDINGS  Left Ventricle: Left ventricular ejection fraction, by estimation, is 55 to 60%. The left ventricle has normal function. The left ventricle has no regional wall motion abnormalities. The left ventricular internal cavity size was normal in size. There is  borderline left ventricular hypertrophy. The interventricular septum is flattened in systole and diastole, consistent with right ventricular pressure and volume overload. Left ventricular diastolic parameters are consistent with Grade I diastolic dysfunction (impaired relaxation). Right Ventricle: The right ventricular size is normal. No increase in right ventricular wall thickness. Right ventricular systolic function is  mildly reduced. There is severely elevated pulmonary artery systolic pressure. The tricuspid regurgitant velocity is 3.67 m/s, and with an assumed right atrial pressure of 8 mmHg, the estimated right ventricular systolic pressure is 95.6 mmHg. Left Atrium: Left atrial size was mildly dilated. Right Atrium: Right atrial size was normal in size. Pericardium: There is no evidence of pericardial effusion. Mitral Valve: The mitral valve is abnormal. There is mild thickening of the mitral valve leaflet(s). There is mild calcification of the mitral valve leaflet(s). Mild to moderate mitral annular calcification. Mild mitral valve regurgitation. Tricuspid Valve: The tricuspid valve is grossly normal. Tricuspid valve regurgitation is mild to moderate. Aortic Valve: The aortic valve is tricuspid. Aortic valve regurgitation is trivial. Mild aortic valve sclerosis is present, with no evidence of aortic valve stenosis. Mild aortic valve annular calcification. Pulmonic Valve: The pulmonic valve was grossly normal. Pulmonic valve regurgitation is trivial. Aorta: The aortic root is normal in size and structure. Venous: The inferior vena cava is normal in size with less than 50% respiratory variability, suggesting right atrial pressure of 8 mmHg. IAS/Shunts: No atrial level shunt detected by color flow Doppler.  LEFT VENTRICLE PLAX 2D LVIDd:         4.20 cm     Diastology LVIDs:         2.70 cm     LV e' lateral:   4.95 cm/s LV PW:         1.00 cm     LV E/e' lateral: 16.0 LV IVS:        1.00 cm     LV e' medial:    3.09 cm/s LVOT diam:     1.50 cm     LV E/e' medial:  25.6 LV SV:         40 LV SV Index:   29 LVOT Area:     1.77 cm  LV Volumes (MOD) LV vol d, MOD A2C: 74.2 ml LV vol d, MOD A4C: 99.4 ml LV vol s, MOD A2C: 38.1 ml LV vol s, MOD A4C: 50.3 ml LV SV MOD A2C:     36.1 ml LV SV MOD A4C:     99.4 ml LV SV MOD BP:      43.9 ml RIGHT VENTRICLE RV S prime:     6.58 cm/s TAPSE (M-mode): 1.1 cm LEFT ATRIUM             Index  RIGHT ATRIUM           Index LA diam:        4.70 cm 3.46 cm/m  RA Area:     15.30 cm LA Vol (A2C):   47.7 ml 35.15 ml/m RA Volume:   39.10 ml  28.81 ml/m LA Vol (A4C):   50.5 ml 37.22 ml/m LA Biplane Vol: 49.4 ml 36.41 ml/m  AORTIC VALVE LVOT Vmax:   110.00 cm/s LVOT Vmean:  68.000 cm/s LVOT VTI:    0.224 m  AORTA Ao Root diam: 2.90 cm MITRAL VALVE               TRICUSPID VALVE MV Area (PHT): 2.26 cm    TR Peak grad:   53.9 mmHg MV Decel Time: 335 msec    TR Vmax:        367.00 cm/s MV E velocity: 79.20 cm/s MV A velocity: 93.20 cm/s  SHUNTS MV E/A ratio:  0.85        Systemic VTI:  0.22 m                            Systemic Diam: 1.50 cm Rozann Lesches MD Electronically signed by Rozann Lesches MD Signature Date/Time: 08/01/2019/5:36:32 PM    Final    DG FEMUR, MIN 2 VIEWS RIGHT  Result Date: 08/04/2019 CLINICAL DATA:  Fall, right-sided weakness EXAM: RIGHT FEMUR 2 VIEWS COMPARISON:  None. FINDINGS: No fracture or malalignment. Vascular calcifications. Edema within the soft tissues. IMPRESSION: No acute osseous abnormality. Electronically Signed   By: Donavan Foil M.D.   On: 08/04/2019 19:34    Time Spent in minutes  30   Lala Lund M.D on 08/05/2019 at 10:45 AM  To page go to [email or web address] - password [facility]

## 2019-08-05 NOTE — Progress Notes (Signed)
OT Cancellation Note  Patient Details Name: MAIRE GOVAN MRN: [id number] DOB: 1950/04/26   Cancelled Treatment:    Reason Eval/Treat Not Completed: Medical issues which prohibited therapy. Upon chart review patient having new onset of significant R sided weakness. Still waiting for testing to r/o DVT. Will re-attempt as able.  Delbert Phenix OT OT office: [phone]   Rosemary Holms 08/05/2019, 10:23 AM

## 2019-08-05 NOTE — Sedation Documentation (Addendum)
12 min total time 12.55mcg fentanyl given Report called to floor rn

## 2019-08-05 NOTE — Progress Notes (Signed)
SLP Cancellation Note  Patient Details Name: Christina [name] MRN: [id number] DOB: [birth year]   Cancelled treatment:       Reason Eval/Treat Not Completed: Patient at procedure or test/unavailable (Pt off unit at this time. SLP will follow up. )  Chas Axel I. Hardin Negus, [place], [place] Office number [phone] Pager [place] 08/05/2019, 3:12 PM

## 2019-08-05 NOTE — Progress Notes (Signed)
[facility] Progress Note  Subjective: not in room, in IR   Vitals:   08/05/19 0541 08/05/19 0542 08/05/19 0600 08/05/19 0743  BP: 119/75   127/74  Pulse: 68 67 67 74  Resp: (!) '21 19 16 20  ' Temp:    97.7 F (36.5 C)  TempSrc:    Oral  SpO2: 100% 100% 99% 97%  Weight:      Height:        Exam: General: Alert awake and following commands, extubated. Heart:RRR, s1s2 nl, no rubs Lungs: Basal crackles, no wheezing Abdomen:soft, Non-tender, non-distended Extremities:No LE edema Dialysis Access: AV fistula has good thrill and bruit    OP HD: [facility] MWF  4h  40kg  2/2 bath  Hep none   LUE AVF  mircera 200 ug last 6/30  venofer 100 tiw  calc 0.75 tiw  Summary: Pt is a 69 y.o. yo female  with h/o HTN, CAD s/p stent, A. fib, anxiety depression, anemia, SBO, mesenteric ischemia s/p SMA stenting, multiple recent admissions,[facility] cardiac arrest seen as a consultation for the management of ESRD.  Assessment/ Plan: 1. Cardiac arrest/V. tach: Probably contributed by hyperkalemia. Echo with EF of 55 to 60%. 2. Acute/ subacute LE DVT: going to IR for IVC filter today.  3. ESRD: MWF HD. AV fistula for the access.  HD today, or could be postponed until tomorrow 4. Acute respiratory failure with hypoxia: now extubated, Alert awake. 5. Hypertension: Blood pressure acceptable today.  May need midodrine for intradialytic hypotension.UF during HD. Continue monitor. 6. Anemia of ESRD: Monitor hemoglobin. Recently got Mircera and iron.  Check iron studies.  Transfuse as needed. 7. Metabolic Bone Disease: Resume binders when able to take orally.  Apparently phosphorus was low yesterday.  Monitor lab. 8. CT head showed subdural hematoma.  Avoid heparin. 9. Hypokalemia: Improved with high potassium bath. 10. Disposition: Poor adherence with outpatient dialysis.  She is DNR.   Rob Jalon Blackwelder 08/05/2019, 8:52  AM   Recent Labs  Lab  0000 08/03/19 0346 08/03/19 1621 08/03/19 2033 08/04/19 0331 08/05/19 0500  K  --  3.3*  --    < > 3.8 4.9  BUN   < > 43*  --   --  21 39*  CREATININE   < > 4.41*  --   --  2.87* 4.16*  CALCIUM   < > 7.1*  --   --  8.0* 8.6*  PHOS  --  6.3* 2.1*  --   --   --   HGB   < > 7.2*  --   --  8.0* 7.7*   < > = values in this interval not displayed.   Inpatient medications: . amiodarone  200 mg Oral BID  . arformoterol  15 mcg Nebulization BID  . budesonide (PULMICORT) nebulizer solution  0.5 mg Nebulization BID  . carvedilol  6.25 mg Oral BID WC  . chlorhexidine gluconate (MEDLINE KIT)  15 mL Mouth Rinse BID  . Chlorhexidine Gluconate Cloth  6 each Topical Q0600  . gabapentin  300 mg Oral BID  . insulin aspart  0-6 Units Subcutaneous Q4H  . mouth rinse  15 mL Mouth Rinse BID  . neomycin-bacitracin-polymyxin   Topical BID  . pantoprazole (PROTONIX) IV  40 mg Intravenous QHS  . sodium chloride flush  10-40 mL Intracatheter Q12H   . sodium chloride    . sodium chloride  sodium chloride, sodium chloride, acetaminophen, albuterol, alteplase, docusate sodium, guaiFENesin, hydrALAZINE, lidocaine (PF), lidocaine-prilocaine, LORazepam, midodrine, ondansetron (ZOFRAN) IV, pentafluoroprop-tetrafluoroeth, polyethylene glycol, sodium chloride flush, traMADol

## 2019-08-05 NOTE — Sedation Documentation (Signed)
Pt tolerated procedure very well. Procedure finished

## 2019-08-05 NOTE — Progress Notes (Signed)
Brief Progress Note Notified by Vascular of a new DVR on the right.  RIGHT:  - Findings consistent with age indeterminate deep vein thrombosis  involving the right common femoral vein, SF junction, right femoral vein,  right proximal profunda vein, right popliteal vein, and right posterior  tibial veins.  - Findings consistent with age indeterminate superficial vein thrombosis  involving the right great saphenous vein.   The study was ordered by CCM as a consulting group , however we have subsequently signed off. I have communicated the results to Dr. Candiss Norse by phone so that he as primary can treat as he feels is clinically appropriate.   Magdalen Spatz, MSN, AGACNP-BC [place] for personal pager PCCM on call pager [phone] 08/05/2019 2:44 PM

## 2019-08-05 NOTE — Progress Notes (Signed)
PT Cancellation Note  Patient Details Name: Christina [name] MRN: [id number] DOB: 23-Oct-1950   Cancelled Treatment:    Reason Eval/Treat Not Completed: (P) Patient at procedure or test/unavailable Pt is off floor for MRI. PT will follow back today as able.  Keil Pickering B. Migdalia Dk PT, DPT Acute Rehabilitation Services Pager [phone] Office [phone]    [place] 08/05/2019, 11:20 AM

## 2019-08-05 NOTE — Progress Notes (Signed)
Chief Complaint: Patient was seen in consultation today for IVC filter  Referring Physician(s): Dr. Candiss Norse  Supervising Physician: Arne Cleveland  Patient Status: [facility] - In-pt  History of Present Illness: Christina [name] is a 69 y.o. female with multiple medical issues admitted with cardiac arrest. She also developed small subdural hematoma. Has recent hx of GI bleed in addition to her underlying medical problems. Now with acute/subacute LE DVT. Given her hx of GI bleed and SDH, full anticoagulation is contra-indicated. Therefore IR is asked to place IVC Filter to reduce risk of PE. PMHx, meds, labs, imaging, allergies reviewed. Has been NPO today as directed.    Past Medical History:  Diagnosis Date  . Anemia   . Anxiety   . Arthritis   . Asthma   . Atrial fibrillation ([place])   . Chronic kidney disease    Dialysis T/Th/Sa  started in March 2020  . COPD (chronic obstructive pulmonary disease) ([place])   . Coronary artery disease    2 stents  . Depression   . GERD (gastroesophageal reflux disease)   . GI bleeding 06/15/2019  . Gout   . Headache    migraines  . History of kidney stones   . Hyperlipidemia   . Hypertension   . Pneumonia   . Small bowel obstruction [facility])     Past Surgical History:  Procedure Laterality Date  . ABDOMINAL AORTOGRAM W/LOWER EXTREMITY Bilateral 07/08/2019   Procedure: ABDOMINAL AORTOGRAM W/LOWER EXTREMITY;  Surgeon: Serafina Mitchell, MD;  Location: [place] [facility];  Service: Vascular;  Laterality: Bilateral;  . ABDOMINAL HYSTERECTOMY    . ABDOMINAL SURGERY     for small bowel obstruction - x 2  . APPENDECTOMY    . AV FISTULA PLACEMENT Left 08/04/2018   Procedure: ARTERIOVENOUS (AV) FISTULA CREATION LEFT ARM;  Surgeon: Waynetta Sandy, MD;  Location: [place];  Service: Vascular;  Laterality: Left;  . BASCILIC VEIN TRANSPOSITION Left 11/24/2018   Procedure: SECOND STAGE BASILIC VEIN TRANSPOSITION LEFT ARM;  Surgeon: Waynetta Sandy, MD;  Location: [place];  Service: Vascular;  Laterality: Left;  . BIOPSY  03/26/2019   Procedure: BIOPSY;  Surgeon: Lavena Bullion, DO;  Location: [facility] ENDOSCOPY;  Service: Gastroenterology;;  . BIOPSY  07/05/2019   Procedure: BIOPSY;  Surgeon: Ladene Artist, MD;  Location: [facility] ENDOSCOPY;  Service: Endoscopy;;  . CARDIAC CATHETERIZATION    . CORONARY ANGIOPLASTY  ?2003/2004  . ESOPHAGOGASTRODUODENOSCOPY N/A 06/16/2019   Procedure: ESOPHAGOGASTRODUODENOSCOPY (EGD);  Surgeon: [name], MD;  Location: [facility] ENDOSCOPY;  Service: Endoscopy;  Laterality: N/A;  . ESOPHAGOGASTRODUODENOSCOPY (EGD) WITH PROPOFOL N/A 03/26/2019   Procedure: ESOPHAGOGASTRODUODENOSCOPY (EGD) WITH PROPOFOL;  Surgeon: Lavena Bullion, DO;  Location: [place];  Service: Gastroenterology;  Laterality: N/A;  . ESOPHAGOGASTRODUODENOSCOPY (EGD) WITH PROPOFOL N/A 07/05/2019   Procedure: ESOPHAGOGASTRODUODENOSCOPY (EGD) WITH PROPOFOL;  Surgeon: Ladene Artist, MD;  Location: [facility] ENDOSCOPY;  Service: Endoscopy;  Laterality: N/A;  . FACIAL RECONSTRUCTION SURGERY     x 2  . HERNIA REPAIR    . INTRAVASCULAR LITHOTRIPSY Right 07/12/2019   Procedure: INTRAVASCULAR LITHOTRIPSY;  Surgeon: Serafina Mitchell, MD;  Location: [place] [facility];  Service: Cardiovascular;  Laterality: Right;  sfa  . IR REMOVAL TUN CV CATH W/O FL  07/14/2019  . PERIPHERAL VASCULAR BALLOON ANGIOPLASTY Right 07/12/2019   Procedure: PERIPHERAL VASCULAR BALLOON ANGIOPLASTY;  Surgeon: Serafina Mitchell, MD;  Location: [place] [facility];  Service: Cardiovascular;  Laterality: Right;  sfa  . PERIPHERAL VASCULAR INTERVENTION  04/27/2019   Procedure: PERIPHERAL VASCULAR INTERVENTION;  Surgeon: Marty Heck, MD;  Location: [place] [facility];  Service: Cardiovascular;;  SMA  . PERIPHERAL VASCULAR INTERVENTION Left 07/08/2019   Procedure: PERIPHERAL VASCULAR INTERVENTION;  Surgeon: Serafina Mitchell, MD;  Location: [place] [facility];   Service: Vascular;  Laterality: Left;  left common and external iliac  . VISCERAL ANGIOGRAPHY N/A 04/27/2019   Procedure: MESENTERIC ANGIOGRAPHY;  Surgeon: Marty Heck, MD;  Location: [place] [facility];  Service: Cardiovascular;  Laterality: N/A;    Allergies: Betaine, Cyclobenzaprine, Morphine, Penicillins, Ambien [zolpidem], Codeine, and Hydromorphone  Medications: Prior to Admission medications   Medication Sig Start Date End Date Taking? Authorizing Provider  albuterol (PROVENTIL) (2.5 MG/3ML) 0.083% nebulizer solution Take 2.5 mg by nebulization every 6 (six) hours as needed for wheezing or shortness of breath.   Yes [provider]  allopurinol (ZYLOPRIM) 100 MG tablet Take 100 mg by mouth daily. 02/18/18  Yes [provider]  amiodarone (PACERONE) 200 MG tablet Take 1 tablet (200 mg total) by mouth 2 (two) times daily. 06/20/19  Yes Georgette Shell, MD  aspirin 81 MG EC tablet Take 81 mg by mouth daily. 05/12/19  Yes [provider]  budesonide-formoterol (SYMBICORT) 160-4.5 MCG/ACT inhaler Inhale 2 puffs into the lungs every 4 (four) hours as needed ("for shortness of breath or wheezing").    Yes [provider]  Cholecalciferol (VITAMIN D3) 50 MCG (2000 UT) TABS Take 4,000 Units by mouth in the morning.   Yes [provider]  clopidogrel (PLAVIX) 75 MG tablet Take 75 mg by mouth daily. 06/09/19  Yes [provider]  dicyclomine (BENTYL) 10 MG capsule Take 1 capsule (10 mg total) by mouth at bedtime as needed for spasms. Patient taking differently: Take 10 mg by mouth daily as needed for spasms.  07/14/19  Yes Dahal, Marlowe Aschoff, MD  diphenhydrAMINE (BENADRYL) 25 MG tablet Take 25 mg by mouth at bedtime as needed for sleep.    Yes [provider]  docusate sodium (COLACE) 100 MG capsule Take 1 capsule (100 mg total) by mouth 2 (two) times daily. Patient taking differently: Take 300 mg by mouth in the morning and at  bedtime.  08/11/18  Yes Hongalgi, Lenis Dickinson, MD  ferric citrate (AURYXIA) 1 GM 210 MG(Fe) tablet Take 420 mg by mouth 3 (three) times daily with meals.    Yes [provider]  Ipratropium-Albuterol (COMBIVENT) 20-100 MCG/ACT AERS respimat Inhale 2 puffs into the lungs 4 (four) times daily as needed for wheezing or shortness of breath.  09/11/17  Yes [provider]  isosorbide mononitrate (IMDUR) 30 MG 24 hr tablet Take 30 mg by mouth at bedtime.  08/17/18  Yes [provider]  lidocaine-prilocaine (EMLA) cream Apply 1 application topically daily as needed (pain at the site of port access).  09/03/18  Yes [provider]  metoprolol tartrate (LOPRESSOR) 100 MG tablet Take 100 mg by mouth 2 (two) times daily.   Yes [provider]  nitroGLYCERIN (NITROSTAT) 0.4 MG SL tablet Place 0.4 mg under the tongue every 5 (five) minutes as needed for chest pain.  09/18/16  Yes [provider]  ondansetron (ZOFRAN-ODT) 4 MG disintegrating tablet Take 4 mg by mouth every 8 (eight) hours as needed for nausea (DISSOLVE ORALLY).   Yes [provider]  pantoprazole (PROTONIX) 40 MG tablet Take 1 tablet (40 mg total) by mouth 2 (two)  times daily. Patient taking differently: Take 40 mg by mouth 2 (two) times daily before a meal.  06/20/19  Yes Georgette Shell, MD  polyethylene glycol (MIRALAX / GLYCOLAX) 17 g packet Take 17 g by mouth daily as needed for mild constipation. 03/30/19  Yes Mercy Riding, MD  pravastatin (PRAVACHOL) 80 MG tablet Take 80 mg by mouth every evening.    Yes [provider]  sennosides-docusate sodium (SENOKOT-S) 8.6-50 MG tablet Take 1 tablet by mouth every 8 (eight) hours as needed for constipation.    Yes [provider]  metoprolol tartrate (LOPRESSOR) 50 MG tablet Take 1 tablet (50 mg total) by mouth 2 (two) times daily. 07/14/19   Terrilee Croak, MD  Nutritional Supplements (FEEDING SUPPLEMENT, NEPRO CARB STEADY,) LIQD  Take 237 mLs by mouth 2 (two) times daily between meals. Patient not taking: Reported on 08/01/2019 07/14/19   Terrilee Croak, MD     Family History  Problem Relation Age of Onset  . Ulcers Sister        sick a long time, improved after surgery    Social History   Socioeconomic History  . Marital status: Single    Spouse name: Not on file  . Number of children: Not on file  . Years of education: Not on file  . Highest education level: Not on file  Occupational History  . Occupation: retired  Tobacco Use  . Smoking status: Former Smoker    Packs/day: 0.50    Years: 50.00    Pack years: 25.00    Quit date: 2019    Years since quitting: 2.5  . Smokeless tobacco: Never Used  Vaping Use  . Vaping Use: Never used  Substance and Sexual Activity  . Alcohol use: Not Currently    Comment: quit 2019 - "I was a drunk"  . Drug use: Not Currently    Types: Marijuana    Comment: h/o drug use - "I was raised in the 60s"; last use 18 months ago; smokes marijuana periodically  . Sexual activity: Not on file  Other Topics Concern  . Not on file  Social History Narrative  . Not on file   Social Determinants of Health   Financial Resource Strain:   . Difficulty of Paying Living Expenses:   Food Insecurity:   . Worried About Charity fundraiser in the Last Year:   . Arboriculturist in the Last Year:   Transportation Needs:   . Film/video editor (Medical):   Marland Kitchen Lack of Transportation (Non-Medical):   Physical Activity:   . Days of Exercise per Week:   . Minutes of Exercise per Session:   Stress:   . Feeling of Stress :   Social Connections:   . Frequency of Communication with Friends and Family:   . Frequency of Social Gatherings with Friends and Family:   . Attends Religious Services:   . Active Member of Clubs or Organizations:   . Attends Archivist Meetings:   Marland Kitchen Marital Status:     Review of Systems: A 12 point ROS discussed and pertinent positives are  indicated in the HPI above.  All other systems are negative.  Review of Systems  Vital Signs: BP 127/74 (BP Location: Left Leg)   Pulse 74   Temp 97.7 F (36.5 C) (Oral)   Resp 20   Ht _0  (1.575 m)   Wt 48.6 kg   SpO2 97%   BMI 19.60 kg/m  Physical Exam Constitutional:      General: She is not in acute distress.    Appearance: She is ill-appearing.  HENT:     Mouth/Throat:     Mouth: Mucous membranes are moist.     Pharynx: Oropharynx is clear.  Cardiovascular:     Rate and Rhythm: Normal rate and regular rhythm.     Heart sounds: Normal heart sounds.  Pulmonary:     Effort: Pulmonary effort is normal. No respiratory distress.     Breath sounds: Normal breath sounds.  Musculoskeletal:     Cervical back: Normal range of motion and neck supple. No rigidity or tenderness.  Skin:    General: Skin is warm and dry.  Neurological:     Mental Status: She is disoriented.     Imaging: CT ANGIO HEAD W OR WO CONTRAST  Result Date: 08/05/2019 CLINICAL DATA:  Right-sided weakness EXAM: CT ANGIOGRAPHY HEAD AND NECK TECHNIQUE: Multidetector CT imaging of the head and neck was performed using the standard protocol during bolus administration of intravenous contrast. Multiplanar CT image reconstructions and MIPs were obtained to evaluate the vascular anatomy. Carotid stenosis measurements (when applicable) are obtained utilizing NASCET criteria, using the distal internal carotid diameter as the denominator. CONTRAST:  25m OMNIPAQUE IOHEXOL 350 MG/ML SOLN COMPARISON:  06/19/2019 FINDINGS: CTA NECK Aortic arch: Calcified and irregular noncalcified plaque again identified along the arch. Great vessel origins are patent. Right carotid system: Patent with multifocal calcified and noncalcified plaque. Stable less than 50% stenosis at the ICA origin. Left carotid system: Patent with multifocal calcified and noncalcified plaque. As before, there is approximately 60% stenosis of the common carotid  at the level of the thyroid. No measurable stenosis at the ICA origin with evidence of prior endarterectomy. Vertebral arteries: Patent with calcified plaque again noted at the origins. Skeleton: Advanced multilevel degenerative changes. Other neck: No acute abnormality. Upper chest: Left greater than right pleural effusions with associated atelectasis. Review of the MIP images confirms the above findings CTA HEAD Anterior circulation: Intracranial internal carotid arteries are patent with calcified plaque causing up to moderate stenosis. Middle and anterior cerebral arteries are patent. Unchanged moderate stenosis of the distal left A2 ACA. Posterior circulation: Intracranial vertebral arteries are patent with calcified and noncalcified plaque. There is unchanged moderate stenosis of the right vertebral artery beyond the PICA origin. Basilar artery is patent with mild atherosclerotic irregularity. Posterior cerebral arteries are patent proximally with atherosclerotic irregularity. There is unchanged diminished opacification of the superior division of the distal left PCA. Venous sinuses: Patent as allowed by contrast bolus timing. Review of the MIP images confirms the above findings IMPRESSION: No substantial change since 06/19/2019. There is irregular noncalcified plaque along the aortic arch. Stable findings of stenosis including left common carotid artery, intracranial right vertebral artery, intracranial ICAs, and left A2 ACA. Persistent pleural effusions and associated atelectasis. Electronically Signed   By: PMacy MisM.D.   On: 08/05/2019 13:50   DG Chest 1 View  Result Date: 08/02/2019 CLINICAL DATA:  Central line placement. EXAM: CHEST  1 VIEW COMPARISON:  08/01/2019.  CT 05/22/2019. FINDINGS: Interim placement left subclavian line, its tip is at the cavoatrial junction. Endotracheal tube and NG tube in stable position. Cardiomegaly. No pulmonary venous congestion. Mild bilateral interstitial  prominence again noted. A component of these changes may be chronic however interstitial edema and/or pneumonitis can not be excluded. Tiny left pleural effusion. No pneumothorax. Surgical clips noted over the neck. IMPRESSION: 1. Interim placement  of left subclavian line, its tip is at the cavoatrial junction. Endotracheal tube and NG tube in stable position. 2. Cardiomegaly. No pulmonary venous congestion. Mild bilateral interstitial prominence again noted. A component of these changes may be chronic however interstitial edema and/or pneumonitis cannot be excluded. Tiny left pleural effusion. Electronically Signed   By: Marcello Moores  Register   On: 08/02/2019 06:10   DG Forearm Right  Result Date: 08/04/2019 CLINICAL DATA:  Fall, right-sided weakness EXAM: RIGHT FOREARM - 2 VIEW COMPARISON:  None. FINDINGS: No fracture or malalignment. Fat pad distention at the elbow consistent with an effusion. Prominent posterior elbow soft tissue swelling. Probable cartilaginous calcification at the wrist. IMPRESSION: No definitive fracture is seen, however there is an elbow effusion and occult fracture cannot be excluded. Electronically Signed   By: Donavan Foil M.D.   On: 08/04/2019 19:33   DG Tibia/Fibula Right  Result Date: 08/04/2019 CLINICAL DATA:  [place] during PT profound right-sided weakness EXAM: RIGHT TIBIA AND FIBULA - 2 VIEW COMPARISON:  None. FINDINGS: No acute displaced fracture or malalignment. Vascular calcifications. Soft tissues are unremarkable. IMPRESSION: No acute osseous abnormality. Electronically Signed   By: Donavan Foil M.D.   On: 08/04/2019 19:30   CT HEAD WO CONTRAST  Addendum Date: 08/05/2019   ADDENDUM REPORT: 08/05/2019 11:34 ADDENDUM: Stable trace falcine subdural hematoma Electronically Signed   By: Macy Mis M.D.   On: 08/05/2019 11:34   Result Date: 08/05/2019 CLINICAL DATA:  New right-sided weakness EXAM: CT HEAD WITHOUT CONTRAST TECHNIQUE: Contiguous axial images were obtained from  the base of the skull through the vertex without intravenous contrast. COMPARISON:  08/02/2019 FINDINGS: Brain: There is no acute intracranial hemorrhage, mass effect, or edema. There is no definite new loss of gray-white differentiation. As before, there are multifocal chronic infarcts involving bilateral cerebral hemispheres including deep gray nuclei and bilateral cerebellar hemispheres. Additional patchy and confluent hypoattenuation in the supratentorial white matter likely reflects advanced chronic microvascular ischemic change. Prominence of the ventricles and sulci reflects stable parenchymal volume loss. Vascular: There is atherosclerotic calcification at the skull base. Skull: Calvarium is unremarkable. Sinuses/Orbits: No acute finding. Other: None. IMPRESSION: No acute intracranial hemorrhage or evidence of acute infarction. Small new infarct difficult to exclude due to extensive chronic ischemic changes. Electronically Signed: By: Macy Mis M.D. On: 08/04/2019 19:39   CT HEAD WO CONTRAST  Result Date: 08/02/2019 CLINICAL DATA:  Follow-up subdural hematoma EXAM: CT HEAD WITHOUT CONTRAST TECHNIQUE: Contiguous axial images were obtained from the base of the skull through the vertex without intravenous contrast. COMPARISON:  Yesterday FINDINGS: Brain: Trace high-density thickening left parafalcine at the vertex, not progressed. This is convincing for subdural hemorrhage when compared with a 06/17/2019 head CT. Extensive chronic small vessel ischemic injury with confluent gliosis in the cerebral white matter. Small remote bilateral cerebellar infarcts. Patchy remote cortical infarcts along the cerebral convexities. Chronic lacune at the right caudate head. No hydrocephalus or masslike finding Vascular: No hyperdense vessel or unexpected calcification. Skull: Normal. Negative for fracture or focal lesion. Sinuses/Orbits: No acute finding. IMPRESSION: 1. Stable trace parafalcine subdural hematoma. 2.  Advanced chronic ischemic injury. Electronically Signed   By: Monte Fantasia M.D.   On: 08/02/2019 06:14   CT HEAD WO CONTRAST  Addendum Date: 08/01/2019   ADDENDUM REPORT: 08/01/2019 13:43 ADDENDUM: Study discussed by telephone with Pulmonary/Critical Care Dr. Valeta Harms on 08/01/2019 at 1339 hours. Electronically Signed   By: Genevie Ann M.D.   On: 08/01/2019 13:43  Result Date: 08/01/2019 CLINICAL DATA:  69 year old female status post cardiac arrest. Dialysis patient. EXAM: CT HEAD WITHOUT CONTRAST TECHNIQUE: Contiguous axial images were obtained from the base of the skull through the vertex without intravenous contrast. COMPARISON:  CTA head and neck 06/19/2019. Head CT 06/17/2019 and earlier. FINDINGS: Brain: Chronic encephalomalacia in the left frontal lobe, left parietal lobe, right greater than left occipital lobes. Superimposed confluent bilateral white matter hypodensity. Chronic hypodensity scattered in the bilateral deep gray matter nuclei. Small chronic cerebellar infarcts again noted. No evidence of acute cerebral edema. No intracranial mass effect. However, there does appear to be a small or trace volume of subdural hematoma along the falx (coronal image 35). But no other acute intracranial hemorrhage identified. Vascular: Calcified atherosclerosis at the skull base. Skull: No acute osseous abnormality identified. Sinuses/Orbits: Visualized paranasal sinuses and mastoids are stable and well pneumatized. Other: Small volume of fluid in the visible pharynx with oral endotracheal and enteric tubes visible. No acute orbit or scalp soft tissue finding. IMPRESSION: 1. Trace acute subdural hematoma along the falx. But no other acute intracranial hemorrhage and no intracranial mass effect. 2. No CT evidence of anoxic injury. Advanced chronic ischemic disease appears stable from the CTA on 06/19/2019. Electronically Signed: By: Genevie Ann M.D. On: 08/01/2019 13:28   CT ANGIO NECK W OR WO CONTRAST  Result Date:  08/05/2019 CLINICAL DATA:  Right-sided weakness EXAM: CT ANGIOGRAPHY HEAD AND NECK TECHNIQUE: Multidetector CT imaging of the head and neck was performed using the standard protocol during bolus administration of intravenous contrast. Multiplanar CT image reconstructions and MIPs were obtained to evaluate the vascular anatomy. Carotid stenosis measurements (when applicable) are obtained utilizing NASCET criteria, using the distal internal carotid diameter as the denominator. CONTRAST:  74m OMNIPAQUE IOHEXOL 350 MG/ML SOLN COMPARISON:  06/19/2019 FINDINGS: CTA NECK Aortic arch: Calcified and irregular noncalcified plaque again identified along the arch. Great vessel origins are patent. Right carotid system: Patent with multifocal calcified and noncalcified plaque. Stable less than 50% stenosis at the ICA origin. Left carotid system: Patent with multifocal calcified and noncalcified plaque. As before, there is approximately 60% stenosis of the common carotid at the level of the thyroid. No measurable stenosis at the ICA origin with evidence of prior endarterectomy. Vertebral arteries: Patent with calcified plaque again noted at the origins. Skeleton: Advanced multilevel degenerative changes. Other neck: No acute abnormality. Upper chest: Left greater than right pleural effusions with associated atelectasis. Review of the MIP images confirms the above findings CTA HEAD Anterior circulation: Intracranial internal carotid arteries are patent with calcified plaque causing up to moderate stenosis. Middle and anterior cerebral arteries are patent. Unchanged moderate stenosis of the distal left A2 ACA. Posterior circulation: Intracranial vertebral arteries are patent with calcified and noncalcified plaque. There is unchanged moderate stenosis of the right vertebral artery beyond the PICA origin. Basilar artery is patent with mild atherosclerotic irregularity. Posterior cerebral arteries are patent proximally with  atherosclerotic irregularity. There is unchanged diminished opacification of the superior division of the distal left PCA. Venous sinuses: Patent as allowed by contrast bolus timing. Review of the MIP images confirms the above findings IMPRESSION: No substantial change since 06/19/2019. There is irregular noncalcified plaque along the aortic arch. Stable findings of stenosis including left common carotid artery, intracranial right vertebral artery, intracranial ICAs, and left A2 ACA. Persistent pleural effusions and associated atelectasis. Electronically Signed   By: PMacy MisM.D.   On: 08/05/2019 13:50   MR BRAIN WO CONTRAST  Result Date: 08/05/2019 CLINICAL DATA:  Right-sided weakness EXAM: MRI HEAD WITHOUT CONTRAST TECHNIQUE: Multiplanar, multiecho pulse sequences of the brain and surrounding structures were obtained without intravenous contrast. COMPARISON:  06/17/2019, correlation made with more recent CT imaging FINDINGS: Motion artifact is present. Brain: There are multiple small foci reduced diffusion involving bilateral cerebral hemispheres, bilateral cerebellum and bilateral central gray nuclei. This is superimposed on multifocal chronic infarcts. There is trace subdural hemorrhage along the falx as seen on prior CT. Foci of susceptibility right frontal and parietal lobes compatible with chronic microhemorrhage or less likely mineralization. Confluent areas of T2 hyperintensity in the supratentorial white matter are nonspecific but probably reflect advanced chronic microvascular ischemic changes. There is no intracranial mass or mass effect. Vascular: Major vessel flow voids at the skull base are preserved. Skull and upper cervical spine: Normal marrow signal is preserved. Sinuses/Orbits: Paranasal sinuses are aerated. Orbits are unremarkable. Other: Sella is unremarkable.  Mastoid air cells are clear. IMPRESSION: Numerous small acute infarcts involving multiple vascular territories bilaterally  suggesting a central embolic etiology. Advanced chronic microvascular ischemic changes and multiple chronic infarcts. These results will be called to the ordering clinician or representative by the Radiologist Assistant, and communication documented in the PACS or [employer]. Electronically Signed   By: Macy Mis M.D.   On: 08/05/2019 11:47   PERIPHERAL VASCULAR CATHETERIZATION  Result Date: 07/13/2019 . Patient name: Christina [name] MRN: [id number] DOB: 1950/03/18 Sex: female 07/12/2019 Pre-operative Diagnosis: right leg rest pain Post-operative diagnosis:  Same Surgeon:  Annamarie Major Procedure Performed:  1.  Ultrasound-guided access, left femoral artery  2.  Shockwave intra-arterial lithotripsy, right superficial femoral artery  3.  Drug-coated balloon angioplasty, right superficial femoral artery  4.  Conscious sedation, 52 minutes  Indications: The patient underwent last week with intervention of the left leg.  She comes back today for right leg intervention. Procedure:  The patient was identified in the holding area and taken to room 8.  The patient was then placed supine on the table and prepped and draped in the usual sterile fashion.  A time out was called.  Conscious sedation was administered with the use of IV fentanyl and Versed under continuous physician and nurse monitoring.  Heart rate, blood pressure, and oxygen saturation were continuously monitored.  Total sedation time was 52 minutes.  Ultrasound was used to evaluate the left common femoral artery.  It was patent .  A digital ultrasound image was acquired.  A micropuncture needle was used to access the left common femoral artery under ultrasound guidance.  An 018 wire was advanced without resistance and a micropuncture sheath was placed.  The 018 wire was removed and a benson wire was placed.  The micropuncture sheath was exchanged for a 6 French 45 cm Terumo sheath.  A versa core wire was advanced over the aortic bifurcation which  was followed by the 6 French sheath which was placed into the right superficial femoral artery.  The patient was then fully heparinized.  Next, right leg runoff was performed Findings:  Right Lower Extremity: Right common femoral and profundofemoral artery widely patent.  The superficial femoral artery is patent however the proximal third of the artery has luminal narrowing draining then 50%.  At the adductor canal there is a short segment lesion which is heavily calcified with greater than 70% stenosis.  The anterior tibial is the dominant runoff vessel.  Intervention: At this point, the patient was fully heparinized.  I advanced a 014  wire across the lesions into the below-knee popliteal artery.  I selected a 4 x 40 shockwave lithotripsy balloon and perform balloon angioplasty with lithotripsy in the superficial femoral artery for lesion length of approximately 200 cm.  Once this was completed at 2 and 4 atm on the balloon, a Ranger 4 x 200 balloon was inserted and drug-coated balloon angioplasty was performed for 3 minutes.  Completion imaging showed inline flow with residual stenosis less than 10%.  There was a nonflow limiting dissection at the proximal and distal edge of the intervention which I elected to monitor.  I did not want to put a stent in because of the small size of her arteries.  At this point sheath and wire were removed.  The patient particularly here for sheath electrocoagulation profile correct Impression:  #1 successful shockwave intra-arterial lithotripsy and subsequent drug-coated balloon angioplasty using 4 mm balloons to the right superficial femoral artery with minimal residual stenosis.  There was a nonflow limiting dissection that was not intervened on.  Theotis Burrow, M.D., [facility] [place] Office: [phone] Pager:  [phone]  PERIPHERAL VASCULAR CATHETERIZATION  Result Date: 07/08/2019 Patient name: Christina [name] MRN: [id number] DOB:  1950/06/12 Sex: female 07/08/2019 Pre-operative Diagnosis: Severe bilateral PAD Post-operative diagnosis:  Same Surgeon:  Annamarie Major Procedure Performed:  1.  Ultrasound-guided access, right femoral artery  2.  Abdominal aortogram  3.  Bilateral lower extremity runoff  4.  Shockwave intra-arterial lithotripsy, left superficial femoral artery  5.  Shockwave intra-arterial lithotripsy, left common iliac artery  6.  Drug-coated balloon angioplasty, left superficial femoral artery, left common iliac artery  7.  Stent, Left common iliac artery  8.  Stent, left external iliac artery  9.  Conscious sedation 122 minutes Indications: The patient has severe bilateral atherosclerotic vascular disease with ulceration of the left toes and rest pain.  She is here today for further evaluation, possible intervention Procedure:  The patient was identified in the holding area and taken to room 8.  The patient was then placed supine on the table and prepped and draped in the usual sterile fashion.  A time out was called.  Conscious sedation was administered with the use of IV fentanyl and Versed under continuous physician and nurse monitoring.  Heart rate, blood pressure, and oxygen saturation were continuously monitored.  Total sedation time was 122 minutes.  Ultrasound was used to evaluate the right common femoral artery.  It was patent .  A digital ultrasound image was acquired.  A micropuncture needle was used to access the right common femoral artery under ultrasound guidance.  An 018 wire was advanced without resistance and a micropuncture sheath was placed.  The 018 wire was removed and a benson wire was placed.  The micropuncture sheath was exchanged for a 5 french sheath.  An omniflush catheter was advanced over the wire to the level of L-1.  An abdominal angiogram was obtained.  Next, the catheter was pulled out of the aortic bifurcation and bilateral runoff was performed. Findings:  Aortogram: Bilateral renal arteries are  widely patent.  The infrarenal abdominal aorta is heavily calcified.  There are areas of hypodensity in the mid infrarenal abdominal aorta.  Pressure gradients across this area were checked and found to not be significant.  The right common iliac artery is heavily calcified and irregular however no hemodynamically significant stenosis was identified.  The left common iliac artery has approximately a 50% stenosis at its origin and a  70% stenosis in the midportion.  There was also a greater than 50% stenosis at the origin of the left external iliac artery  Right Lower Extremity: The right common femoral and profundofemoral artery heavily calcified but patent.  The superficial femoral artery is patent with a greater than 90% stenosis in the midportion as well as a approximate 50% stenosis in the adductor canal.  The anterior tibial is the dominant runoff across the ankle however it does have a abrupt cut off as it crosses the ankle.  The peroneal artery is patent throughout its course.  The posterior tibial artery is occluded  Left Lower Extremity: The left common femoral and profundofemoral artery are calcified but patent without stenosis.  The superficial femoral artery is small in caliber.  There is a greater than 90% stenosis in the adductor canal.  The popliteal artery is widely patent.  There is two-vessel runoff via the posterior tibial and peroneal artery.  The anterior tibial artery is occluded. Intervention: After the above images were acquired the decision made to proceed with intervention.  A 6 French 45 cm sheath was advanced into the left external iliac artery.  The patient was fully heparinized.  Using a 014 wire and a quick cross catheter the lesion in the left superficial femoral artery was crossed.  I then performed shockwave intra-arterial lithotripsy of this lesion for a total of 150 seconds using a 4 x 40 balloon.  I then performed drug-coated balloon angioplasty of this lesion using a 4 x 40  impact balloon.  The balloon was held for 2 atm.  Completion imaging showed a nonflow limiting dissection and also a small intimal flap.  I then reinserted the 4 x 40 balloon and repeated balloon angioplasty at a low atmospheres for 3 minutes.  Follow-up imaging showed near resolution of the intimal flap and improved appearance of the nonflow limiting dissection. Next attention was turned towards the common iliac artery.  I performed shockwave intra-arterial lithotripsy of this heavily calcified lesion using the same 4 x 40 balloon.  A total of 120 seconds were performed.  I then used a Ranger 6 x 40 drug-coated balloon for 3 minutes at nominal pressure.  Completion imaging showed persistent stenosis in this area and so I elected to primarily stent this.  This was done with a Herculink 8 x 22.  Follow-up imaging showed resolution of the common iliac stenosis however now the origin of the external iliac artery appeared greater than 70% and I felt that it needed to be addressed and so this was stented using a Abbott self-expanding 8 x 40 stent which was postdilated with a 7 mm balloon.  Completion imaging showed resolution of the stenosis within the left iliac system.  Catheters and wires were removed.  The long 6 French sheath was exchanged out for a short 7 [place] sheath.  The patient be taken the holding area for sheath pull once her coagulation profile correct. Impression:  #1 greater than 70% left common iliac artery stenosis treated with shockwave intra-arterial lithotripsy, drug-coated balloon, and ultimate stenting using an 8 x 22 balloon expandable stent.  #2 proximal left external iliac artery stenosis successfully treated using an 8 x 40 self-expanding stent  #3 near total occlusion of the superficial femoral artery on the left at the adductor canal treated using shockwave intra-arterial lithotripsy and drug-coated balloon angioplasty with a 4 mm balloon.  #4 two-vessel runoff via the posterior tibial and  peroneal artery on the left  #5  there are tandem lesions that are hemodynamically significant in the right superficial femoral artery with two-vessel runoff.  6.  The patient will be brought back for percutaneous intervention of the right leg when she recovers from this procedure. Theotis Burrow, M.D., [facility] [place] Office: [phone] Pager:  [phone]  IR Removal Tun Cv Cath W/O FL  Result Date: 07/14/2019 INDICATION: End-stage renal disease on hemodialysis. Now with functional arteriovenous fistula. Request for removal hemodialysis catheter which was placed by vascular surgery. EXAM: REMOVAL OF TUNNELED HEMODIALYSIS CATHETER MEDICATIONS: 1% lidocaine 3 mL COMPLICATIONS: None immediate. PROCEDURE: Informed written consent was obtained from the patient following an explanation of the procedure, risks, benefits and alternatives to treatment. A time out was performed prior to the initiation of the procedure. Maximal barrier sterile technique was utilized including caps, mask, sterile gowns, sterile gloves, large sterile drape, hand hygiene, and ChloraPrep. 1% lidocaine with epinephrine was injected under sterile conditions along the subcutaneous tunnel. Utilizing a combination of blunt dissection and gentle traction, the catheter was removed intact. Hemostasis was obtained with manual compression. A dressing was placed. The patient tolerated the procedure well without immediate post procedural complication. IMPRESSION: Successful removal of tunneled dialysis catheter. Read by: Gareth Eagle, PA-C Electronically Signed   By: Lucrezia Europe M.D.   On: 07/14/2019 11:51   DG CHEST PORT 1 VIEW  Result Date: 08/02/2019 CLINICAL DATA:  Endotracheal tube placement. EXAM: PORTABLE CHEST 1 VIEW COMPARISON:  Earlier this day. FINDINGS: Endotracheal tube tip 4.8 cm from the carina. Tip and side port of the enteric tube below the diaphragm in the stomach. Left subclavian central line  unchanged in the SVC. Stable cardiomegaly. Unchanged mediastinal contours with aortic atherosclerosis. Interstitial thickening suspicious for pulmonary edema, similar. Minimal retrocardiac opacity and blunting of the costophrenic angle. There is no pneumothorax. IMPRESSION: 1. Endotracheal tube tip 4.8 cm from the carina. Enteric tube in place. 2. Unchanged cardiomegaly and interstitial opacities suspicious for pulmonary edema. Possible small left pleural effusion. Electronically Signed   By: Keith Rake M.D.   On: 08/02/2019 18:59   DG Chest Port 1 View  Result Date: 08/01/2019 CLINICAL DATA:  Intubation.  Assess for pneumothorax. EXAM: PORTABLE CHEST 1 VIEW COMPARISON:  August 01, 2019 7:58 a.m. FINDINGS: Endotracheal tube is identified 5.2 cm from carina. There is no pleural line to suggest pneumothorax. Nasogastric tube is identified distal tip not included on film but is at least in the stomach. Mild diffuse increased interstitium is identified bilaterally. Patchy opacities of both lung bases are noted. The heart size is mildly enlarged. The bony structures are stable. IMPRESSION: 1. Endotracheal tube 5.2 cm from carina. No pneumothorax. 2. Mild pulmonary edema. 3. Patchy opacities of both lung bases, superimposed pneumonia is not excluded. Electronically Signed   By: Abelardo Diesel M.D.   On: 08/01/2019 13:05   DG Humerus Right  Result Date: 08/04/2019 CLINICAL DATA:  Right-sided weakness history of fall EXAM: RIGHT HUMERUS - 2+ VIEW COMPARISON:  None. FINDINGS: There is no evidence of fracture or other focal bone lesions. Soft tissues are unremarkable. IMPRESSION: Negative. Electronically Signed   By: Donavan Foil M.D.   On: 08/04/2019 19:31   ECHOCARDIOGRAM COMPLETE  Result Date: 08/01/2019    ECHOCARDIOGRAM REPORT   Patient Name:   Christina [name] Date of Exam: 08/01/2019 Medical Rec #:  [id number]          Height:       62.0 in Accession #:  [id number]         Weight:       89.3 lb Date of  Birth:  04/05/1950           BSA:          1.357 m Patient Age:    89 years           BP:           153/69 mmHg Patient Gender: F                  HR:           49 bpm. Exam Location:  Inpatient Procedure: 2D Echo, Cardiac Doppler and Color Doppler Indications:    Cardiac arrest ([place]) [427.5.ICD-9-CM]  History:        Patient has prior history of Echocardiogram examinations, most                 recent 06/20/2019. CAD, Stroke and COPD, Arrythmias:Atrial                 Fibrillation, Signs/Symptoms:Chest Pain; Risk                 Factors:Hypertension, Dyslipidemia and Former Smoker.  Sonographer:    Vickie Epley RDCS Referring Phys: [id number] GRACE E BOWSER  Sonographer Comments: Echo performed with patient supine and on artificial respirator. IMPRESSIONS  1. Left ventricular ejection fraction, by estimation, is 55 to 60%. The left ventricle has normal function. The left ventricle has no regional wall motion abnormalities. There is the interventricular septum is mildly flattened in systole and diastole, consistent with right ventricular pressure and volume overload.  2. Right ventricular systolic function is mildly reduced. The right ventricular size is normal. There is severely elevated pulmonary artery systolic pressure. The estimated right ventricular systolic pressure is 03.2 mmHg.  3. Left atrial size was mildly dilated.  4. The mitral valve is abnormal. Mild mitral valve regurgitation.  5. Tricuspid valve regurgitation is mild to moderate.  6. The aortic valve is tricuspid. Aortic valve regurgitation is trivial. Mild aortic valve sclerosis is present, with no evidence of aortic valve stenosis.  7. The inferior vena cava is normal in size with <50% respiratory variability, suggesting right atrial pressure of 8 mmHg. FINDINGS  Left Ventricle: Left ventricular ejection fraction, by estimation, is 55 to 60%. The left ventricle has normal function. The left ventricle has no regional wall motion abnormalities. The left  ventricular internal cavity size was normal in size. There is  borderline left ventricular hypertrophy. The interventricular septum is flattened in systole and diastole, consistent with right ventricular pressure and volume overload. Left ventricular diastolic parameters are consistent with Grade I diastolic dysfunction (impaired relaxation). Right Ventricle: The right ventricular size is normal. No increase in right ventricular wall thickness. Right ventricular systolic function is mildly reduced. There is severely elevated pulmonary artery systolic pressure. The tricuspid regurgitant velocity is 3.67 m/s, and with an assumed right atrial pressure of 8 mmHg, the estimated right ventricular systolic pressure is 12.2 mmHg. Left Atrium: Left atrial size was mildly dilated. Right Atrium: Right atrial size was normal in size. Pericardium: There is no evidence of pericardial effusion. Mitral Valve: The mitral valve is abnormal. There is mild thickening of the mitral valve leaflet(s). There is mild calcification of the mitral valve leaflet(s). Mild to moderate mitral annular calcification. Mild mitral valve regurgitation. Tricuspid Valve: The tricuspid valve is grossly normal. Tricuspid valve regurgitation is mild to moderate. Aortic Valve:  The aortic valve is tricuspid. Aortic valve regurgitation is trivial. Mild aortic valve sclerosis is present, with no evidence of aortic valve stenosis. Mild aortic valve annular calcification. Pulmonic Valve: The pulmonic valve was grossly normal. Pulmonic valve regurgitation is trivial. Aorta: The aortic root is normal in size and structure. Venous: The inferior vena cava is normal in size with less than 50% respiratory variability, suggesting right atrial pressure of 8 mmHg. IAS/Shunts: No atrial level shunt detected by color flow Doppler.  LEFT VENTRICLE PLAX 2D LVIDd:         4.20 cm     Diastology LVIDs:         2.70 cm     LV e' lateral:   4.95 cm/s LV PW:         1.00 cm     LV  E/e' lateral: 16.0 LV IVS:        1.00 cm     LV e' medial:    3.09 cm/s LVOT diam:     1.50 cm     LV E/e' medial:  25.6 LV SV:         40 LV SV Index:   29 LVOT Area:     1.77 cm  LV Volumes (MOD) LV vol d, MOD A2C: 74.2 ml LV vol d, MOD A4C: 99.4 ml LV vol s, MOD A2C: 38.1 ml LV vol s, MOD A4C: 50.3 ml LV SV MOD A2C:     36.1 ml LV SV MOD A4C:     99.4 ml LV SV MOD BP:      43.9 ml RIGHT VENTRICLE RV S prime:     6.58 cm/s TAPSE (M-mode): 1.1 cm LEFT ATRIUM             Index       RIGHT ATRIUM           Index LA diam:        4.70 cm 3.46 cm/m  RA Area:     15.30 cm LA Vol (A2C):   47.7 ml 35.15 ml/m RA Volume:   39.10 ml  28.81 ml/m LA Vol (A4C):   50.5 ml 37.22 ml/m LA Biplane Vol: 49.4 ml 36.41 ml/m  AORTIC VALVE LVOT Vmax:   110.00 cm/s LVOT Vmean:  68.000 cm/s LVOT VTI:    0.224 m  AORTA Ao Root diam: 2.90 cm MITRAL VALVE               TRICUSPID VALVE MV Area (PHT): 2.26 cm    TR Peak grad:   53.9 mmHg MV Decel Time: 335 msec    TR Vmax:        367.00 cm/s MV E velocity: 79.20 cm/s MV A velocity: 93.20 cm/s  SHUNTS MV E/A ratio:  0.85        Systemic VTI:  0.22 m                            Systemic Diam: 1.50 cm Rozann Lesches MD Electronically signed by Rozann Lesches MD Signature Date/Time: 08/01/2019/5:36:32 PM    Final    DG FEMUR, MIN 2 VIEWS RIGHT  Result Date: 08/04/2019 CLINICAL DATA:  Fall, right-sided weakness EXAM: RIGHT FEMUR 2 VIEWS COMPARISON:  None. FINDINGS: No fracture or malalignment. Vascular calcifications. Edema within the soft tissues. IMPRESSION: No acute osseous abnormality. Electronically Signed   By: Donavan Foil M.D.   On: 08/04/2019 19:34   VAS [place] LOWER  EXTREMITY VENOUS (DVT)  Result Date: 08/05/2019  Lower Venous DVTStudy Indications: Swelling.  Comparison Study: no prior Performing Technologist: Abram Sander RVS  Examination Guidelines: A complete evaluation includes B-mode imaging, spectral Doppler, color Doppler, and power Doppler as needed of all accessible  portions of each vessel. Bilateral testing is considered an integral part of a complete examination. Limited examinations for reoccurring indications may be performed as noted. The reflux portion of the exam is performed with the patient in reverse Trendelenburg.  +---------+---------------+---------+-----------+----------+-----------------+ RIGHT    CompressibilityPhasicitySpontaneityPropertiesThrombus Aging    +---------+---------------+---------+-----------+----------+-----------------+ CFV      Partial        Yes      Yes                  Age Indeterminate +---------+---------------+---------+-----------+----------+-----------------+ SFJ      None                                         Age Indeterminate +---------+---------------+---------+-----------+----------+-----------------+ FV Prox  None                                         Age Indeterminate +---------+---------------+---------+-----------+----------+-----------------+ FV Mid   None                                         Age Indeterminate +---------+---------------+---------+-----------+----------+-----------------+ FV DistalNone                                         Age Indeterminate +---------+---------------+---------+-----------+----------+-----------------+ PFV      None                                         Age Indeterminate +---------+---------------+---------+-----------+----------+-----------------+ POP      None           No       No                   Age Indeterminate +---------+---------------+---------+-----------+----------+-----------------+ PTV      None                                         Age Indeterminate +---------+---------------+---------+-----------+----------+-----------------+ PERO     None                                         Age Indeterminate +---------+---------------+---------+-----------+----------+-----------------+ GSV      None                                                            +---------+---------------+---------+-----------+----------+-----------------+ EIV  Yes      Yes                                    +---------+---------------+---------+-----------+----------+-----------------+   +----+---------------+---------+-----------+----------+--------------+ LEFTCompressibilityPhasicitySpontaneityPropertiesThrombus Aging +----+---------------+---------+-----------+----------+--------------+ CFV Full           Yes      Yes                                 +----+---------------+---------+-----------+----------+--------------+     Summary: RIGHT: - Findings consistent with age indeterminate deep vein thrombosis involving the right common femoral vein, SF junction, right femoral vein, right proximal profunda vein, right popliteal vein, and right posterior tibial veins. - Findings consistent with age indeterminate superficial vein thrombosis involving the right great saphenous vein.   *See table(s) above for measurements and observations.    Preliminary    VAS [place] UPPER EXTREMITY VENOUS DUPLEX  Result Date: 08/05/2019 UPPER VENOUS STUDY  Indications: Swelling Comparison Study: no prior Performing Technologist: Abram Sander RVS  Examination Guidelines: A complete evaluation includes B-mode imaging, spectral Doppler, color Doppler, and power Doppler as needed of all accessible portions of each vessel. Bilateral testing is considered an integral part of a complete examination. Limited examinations for reoccurring indications may be performed as noted.  Right Findings: +----------+------------+---------+-----------+----------+-------+ RIGHT     CompressiblePhasicitySpontaneousPropertiesSummary +----------+------------+---------+-----------+----------+-------+ IJV           Full       Yes       Yes                      +----------+------------+---------+-----------+----------+-------+  Subclavian    Full       Yes       Yes                      +----------+------------+---------+-----------+----------+-------+ Axillary      Full       Yes       Yes                      +----------+------------+---------+-----------+----------+-------+ Brachial      Full       Yes       Yes                      +----------+------------+---------+-----------+----------+-------+ Radial        Full                                          +----------+------------+---------+-----------+----------+-------+ Ulnar         Full                                          +----------+------------+---------+-----------+----------+-------+ Cephalic      Full                                          +----------+------------+---------+-----------+----------+-------+ Basilic       Full                                          +----------+------------+---------+-----------+----------+-------+  Summary:  Right: No evidence of deep vein thrombosis in the upper extremity. No evidence of superficial vein thrombosis in the upper extremity.  *See table(s) above for measurements and observations.    Preliminary     Labs:  CBC: Recent Labs    08/02/19 0015 08/03/19 0346 08/04/19 0331 08/05/19 0500  WBC 17.4* 16.6* 14.9* 17.1*  HGB 7.4* 7.2* 8.0* 7.7*  HCT 25.1* 24.3* 27.0* 25.7*  PLT 102* 101* 120* 129*    COAGS: Recent Labs    01/26/19 0352 03/25/19 1313 06/14/19 1010 08/01/19 1208  INR 0.9 1.1 1.2 1.8*    BMP: Recent Labs    08/02/19 0015 08/02/19 0015 08/02/19 0225 08/03/19 0346 08/03/19 2033 08/04/19 0331 08/05/19 0500  NA 137  --   --  141  --  139 137  K 4.3   < >  --  3.3* 3.7 3.8 4.9  CL 95*  --   --  94*  --  99 96*  CO2 32  --   --  34*  --  28 30  GLUCOSE 316*   < > 178* 166*  --  182* 124*  BUN 22  --   --  43*  --  21 39*  CALCIUM 7.0*  --   --  7.1*  --  8.0* 8.6*  CREATININE 3.25*  --   --  4.41*  --  2.87* 4.16*  GFRNONAA 14*  --    --  10*  --  16* 10*  GFRAA 16*  --   --  11*  --  19* 12*   < > = values in this interval not displayed.    LIVER FUNCTION TESTS: Recent Labs    06/29/19 1610 06/30/19 0456 08/01/19 1208 08/01/19 1400 08/03/19 0346 08/05/19 0500  BILITOT 0.7  --  1.6*  --  0.8 1.0  AST 15  --  1,044*  --  305* 62*  ALT 11  --  561*  --  446* 222*  ALKPHOS 43  --  79  --  73 86  PROT 4.9*  --  4.5*  --  3.9* 5.0*  ALBUMIN 2.2*   < > 2.0* 1.9* 1.7* 2.1*   < > = values in this interval not displayed.    TUMOR MARKERS: No results for input(s): AFPTM, CEA, CA199, CHROMGRNA in the last 8760 hours.  Assessment and Plan: LE DVT Hx of GI bleed and Subdural hematoma IVC filter indicated Labs reviewed Risks and benefits discussed with the patient's daughter including, but not limited to bleeding, infection, contrast induced renal failure, filter fracture or migration which can lead to emergency surgery or even death, strut penetration with damage or irritation to adjacent structures and caval thrombosis.  All questions were answered, agreeable to proceed. Consent signed and in chart.    Thank you for this interesting consult.  I greatly enjoyed meeting Christina [name] and look forward to participating in their care.  A copy of this report was sent to the requesting provider on this date.  Electronically Signed: Ascencion Dike, PA-C 08/05/2019, 3:29 PM   I spent a total of 20 minutes in face to face in clinical consultation, greater than 50% of which was counseling/coordinating care for IVC filter

## 2019-08-05 NOTE — Progress Notes (Signed)
Nutrition Follow-up  DOCUMENTATION CODES:   Non-severe (moderate) malnutrition in context of chronic illness  INTERVENTION:    Nepro Shake po TID, each supplement provides 425 kcal and 19 grams protein.  Renal MVI daily.  NUTRITION DIAGNOSIS:   Moderate Malnutrition related to chronic illness (ESRD) as evidenced by mild fat depletion, moderate muscle depletion.  Ongoing   GOAL:   Patient will meet greater than or equal to 90% of their needs  Progressing  MONITOR:   PO intake, Supplement acceptance, Weight trends, Diet advancement, Skin, Labs, I & O's  ASSESSMENT:   Patient with PMH significant for HTN, HLD, CAD s/p stenting, s/p CEA on L, SMA stent, AF, ESRD on HD, ETOH abuse, COPD, GERD, and non-bleeding duodenal ulcers. Presents this admission s/p cardiac arrest from suspected hyperkalemia.  S/P swallow evaluation with SLP 7/8. Diet advanced to regular with thin liquids. Meal completion recorded at 75% of dinner 7/8 and 20% of breakfast today.   EDW: 40 kg Current weight: 48.6 kg  Last HD 7/7, 2 L UF. For HD today.  Labs reviewed. BUN 39, creat 4.16 CBG: [phone] Medications reviewed.   Diet Order:   Diet Order            Diet regular Room service appropriate? Yes with Assist; Fluid consistency: Thin  Diet effective now                 EDUCATION NEEDS:   Not appropriate for education at this time  Skin:  Skin Assessment: Skin Integrity Issues: Skin Integrity Issues:: Other (Comment) Other: L middle toe and R pinkie toe black  Last BM:  7/7  Height:   Ht Readings from Last 1 Encounters:  08/04/19 5\' 2"  (1.575 m)    Weight:   Wt Readings from Last 1 Encounters:  08/05/19 48.6 kg    BMI:  Body mass index is 19.6 kg/m.  Estimated Nutritional Needs:   Kcal:  1500-1700 kcal  Protein:  75-95 grams  Fluid:  1000 ml + UOP    Lucas Mallow, RD, LDN, CNSC Please refer to Amion for contact information.

## 2019-08-05 NOTE — Sedation Documentation (Signed)
Procedure continues, pt is resting well.

## 2019-08-05 NOTE — Consult Note (Addendum)
NEURO HOSPITALIST  CONSULT   Requesting Physician: Dr. Candiss Norse    Chief Complaint: cardiac arrest Neuro consulted for: Subacute stroke  History obtained from:  Patient  / Chart review HPI:                                                                                                                                         Christina [name] is an 69 y.o. female ESRD (on dialysis T/Th/ Sa), HTN, HLD, A. Fib, COPD. CAD( 2 stents) who presented to [facility] on 7/5. She had VT with a pulse in the ED and was cardioverted. K was > 7. She then went asystolic with 10 mins CPR before ROSC. Transferred to [facility] s/p arrest.  Neuro consulted 08/05/19 for subacute strokes.    Patient extubated 7/7 and c/o right arm weakness and pain in right arm and leg. Pain thought to be related to her fall out of bed and the IO. Patient still c/o of right arm weakness and pain in right leg.   Hospital course:  7/5 admitted; presented to [place] hospital. VT with pulse cardioverted. K+ > 7, Asystolic arrest CPR 10 mins with ROSC. Intubated CTH: race acute subdural hematoma along the falx. But no other acute intracranial hemorrhage and no intracranial mass effect. 7/6: WNU:UVOZDG trace parafalcine subdural hematoma advanced chronic ischemia. 7/7 extubated 7/8 CTH: No acute intracranial hemorrhage or evidence of acute infarction. Small new infarct difficult to exclude due to extensive chronic ischemic changes. 7/9 neuro consulted: MRI: pending K: 4.9, BG: 124, BUN: 39, creatinine: 4.16 albumin: 2.1, AST: 62, ALT: 222 WBC: 17.1 Hgb: 7.7   NIHSS:3    Past Medical History:  Diagnosis Date  . Anemia   . Anxiety   . Arthritis   . Asthma   . Atrial fibrillation ([place])   . Chronic kidney disease    Dialysis T/Th/Sa  started in March 2020  . COPD (chronic obstructive pulmonary disease) ([place])   . Coronary artery disease    2 stents  . Depression   . GERD  (gastroesophageal reflux disease)   . GI bleeding 06/15/2019  . Gout   . Headache    migraines  . History of kidney stones   . Hyperlipidemia   . Hypertension   . Pneumonia   . Small bowel obstruction [facility])     Past Surgical History:  Procedure Laterality Date  . ABDOMINAL AORTOGRAM W/LOWER EXTREMITY Bilateral 07/08/2019   Procedure: ABDOMINAL AORTOGRAM W/LOWER EXTREMITY;  Surgeon: Serafina Mitchell, MD;  Location: [place] CV LAB;  Service: Vascular;  Laterality: Bilateral;  . ABDOMINAL HYSTERECTOMY    .  ABDOMINAL SURGERY     for small bowel obstruction - x 2  . APPENDECTOMY    . AV FISTULA PLACEMENT Left 08/04/2018   Procedure: ARTERIOVENOUS (AV) FISTULA CREATION LEFT ARM;  Surgeon: Waynetta Sandy, MD;  Location: [place];  Service: Vascular;  Laterality: Left;  . BASCILIC VEIN TRANSPOSITION Left 11/24/2018   Procedure: SECOND STAGE BASILIC VEIN TRANSPOSITION LEFT ARM;  Surgeon: Waynetta Sandy, MD;  Location: [place];  Service: Vascular;  Laterality: Left;  . BIOPSY  03/26/2019   Procedure: BIOPSY;  Surgeon: Lavena Bullion, DO;  Location: [facility] ENDOSCOPY;  Service: Gastroenterology;;  . BIOPSY  07/05/2019   Procedure: BIOPSY;  Surgeon: Ladene Artist, MD;  Location: [facility] ENDOSCOPY;  Service: Endoscopy;;  . CARDIAC CATHETERIZATION    . CORONARY ANGIOPLASTY  ?2003/2004  . ESOPHAGOGASTRODUODENOSCOPY N/A 06/16/2019   Procedure: ESOPHAGOGASTRODUODENOSCOPY (EGD);  Surgeon: [name], MD;  Location: [facility] ENDOSCOPY;  Service: Endoscopy;  Laterality: N/A;  . ESOPHAGOGASTRODUODENOSCOPY (EGD) WITH PROPOFOL N/A 03/26/2019   Procedure: ESOPHAGOGASTRODUODENOSCOPY (EGD) WITH PROPOFOL;  Surgeon: Lavena Bullion, DO;  Location: [place];  Service: Gastroenterology;  Laterality: N/A;  . ESOPHAGOGASTRODUODENOSCOPY (EGD) WITH PROPOFOL N/A 07/05/2019   Procedure: ESOPHAGOGASTRODUODENOSCOPY (EGD) WITH PROPOFOL;  Surgeon: Ladene Artist, MD;  Location: [facility] ENDOSCOPY;  Service:  Endoscopy;  Laterality: N/A;  . FACIAL RECONSTRUCTION SURGERY     x 2  . HERNIA REPAIR    . INTRAVASCULAR LITHOTRIPSY Right 07/12/2019   Procedure: INTRAVASCULAR LITHOTRIPSY;  Surgeon: Serafina Mitchell, MD;  Location: [place] CV LAB;  Service: Cardiovascular;  Laterality: Right;  sfa  . IR REMOVAL TUN CV CATH W/O FL  07/14/2019  . PERIPHERAL VASCULAR BALLOON ANGIOPLASTY Right 07/12/2019   Procedure: PERIPHERAL VASCULAR BALLOON ANGIOPLASTY;  Surgeon: Serafina Mitchell, MD;  Location: [place] CV LAB;  Service: Cardiovascular;  Laterality: Right;  sfa  . PERIPHERAL VASCULAR INTERVENTION  04/27/2019   Procedure: PERIPHERAL VASCULAR INTERVENTION;  Surgeon: Marty Heck, MD;  Location: [place] CV LAB;  Service: Cardiovascular;;  SMA  . PERIPHERAL VASCULAR INTERVENTION Left 07/08/2019   Procedure: PERIPHERAL VASCULAR INTERVENTION;  Surgeon: Serafina Mitchell, MD;  Location: [place] CV LAB;  Service: Vascular;  Laterality: Left;  left common and external iliac  . VISCERAL ANGIOGRAPHY N/A 04/27/2019   Procedure: MESENTERIC ANGIOGRAPHY;  Surgeon: Marty Heck, MD;  Location: [place] CV LAB;  Service: Cardiovascular;  Laterality: N/A;    Family History  Problem Relation Age of Onset  . Ulcers Sister        sick a long time, improved after surgery       Social History:  reports that she quit smoking about 2 years ago. She has a 25.00 pack-year smoking history. She has never used smokeless tobacco. She reports previous alcohol use. She reports previous drug use. Drug: Marijuana.  Allergies:  Allergies  Allergen Reactions  . Betaine Anaphylaxis  . Cyclobenzaprine Anaphylaxis and Other (See Comments)    "stopped heart"   . Morphine Anaphylaxis and Other (See Comments)    "stopped heart"  . Penicillins Shortness Of Breath, Swelling and Palpitations    Did it involve swelling of the face/tongue/throat, SOB, or low BP? Yes Did it involve sudden or severe rash/hives, skin  peeling, or any reaction on the inside of your mouth or nose? No Did you need to seek medical attention at a hospital or doctor's office? No When did it last happen?years  If all above answers are "NO",  may proceed with cephalosporin use.   . Ambien [Zolpidem] Other (See Comments)    Severe confusion  . Codeine Itching and Rash  . Hydromorphone Other (See Comments)    If administered quickly, felt like hand was "exploding"     Medications:                                                                                                                           Scheduled: . amiodarone  200 mg Oral BID  . arformoterol  15 mcg Nebulization BID  . budesonide (PULMICORT) nebulizer solution  0.5 mg Nebulization BID  . carvedilol  6.25 mg Oral BID WC  . chlorhexidine gluconate (MEDLINE KIT)  15 mL Mouth Rinse BID  . Chlorhexidine Gluconate Cloth  6 each Topical Q0600  . feeding supplement (NEPRO CARB STEADY)  237 mL Oral TID BM  . gabapentin  300 mg Oral BID  . insulin aspart  0-6 Units Subcutaneous Q4H  . mouth rinse  15 mL Mouth Rinse BID  . multivitamin  1 tablet Oral QHS  . neomycin-bacitracin-polymyxin   Topical BID  . pantoprazole (PROTONIX) IV  40 mg Intravenous QHS  . sodium chloride flush  10-40 mL Intracatheter Q12H   Continuous: . sodium chloride    . sodium chloride     YTK:PTWSFK chloride, sodium chloride, acetaminophen, albuterol, alteplase, docusate sodium, guaiFENesin, hydrALAZINE, lidocaine (PF), lidocaine-prilocaine, midodrine, ondansetron (ZOFRAN) IV, pentafluoroprop-tetrafluoroeth, polyethylene glycol, sodium chloride flush, traMADol   ROS:                                                                                                                                       ROS was performed and is negative except as noted in HPI    General Examination:                                                                                                      Blood  pressure 127/74,  pulse 74, temperature 97.7 F (36.5 C), temperature source Oral, resp. rate 20, height '5\' 2"'  (1.575 m), weight 48.6 kg, SpO2 97 %.  Physical Exam  Constitutional: Appears undernourished.  Eyes: Normal external eye and conjunctiva. HENT: Normocephalic, no lesions, without obvious abnormality. Missing dentiton  Musculoskeletal-right leg warm with ecchymosis on right shin Cardiovascular: Normal rate and regular rhythm.  Respiratory: Effort normal, non-labored breathing saturations WNL on 2L [place] GI: Soft.  No distension. There is no tenderness.  Skin: WD dressing on right shin  Neurological Examination Mental Status: Drowsy, required repeated stimulation, oriented name/age/ year missed month, thought content appropriate.  Speech fluent without evidence of aphasia.  Able to follow simple commands without difficulty. Cranial Nerves: II:  R upper field cut.  patient tightly closes eyes, unable to visualized pupils.  Face appeared symmetric,  VIII: hearing normal bilaterally IX,X: uvula rises midline XI: bilateral shoulder shrug XII: midline tongue extension Motor: Right : Upper extremity   3/5 Left:     Upper extremity   4+/5  Lower extremity   4+/5 Lower extremity   4+/5 Tone and bulk:normal tone throughout; no atrophy noted Sensory: light touch intact throughout, bilaterally Deep Tendon Reflexes: unable to illicit reflexes in RUE and RLE possible d./t swelling. 2+ left patella Cerebellar: No ataxia noted with Left FNF, unable to perform FNF with right arm. Gait: deferred   Lab Results: Basic Metabolic Panel: Recent Labs  Lab 08/01/19 1208 08/01/19 1211 08/01/19 1400 08/01/19 1400 08/02/19 0015 08/02/19 0015 08/02/19 0225 08/02/19 1845 08/03/19 0346 08/03/19 1621 08/03/19 2033 08/04/19 0331 08/05/19 0500  NA 149*   < > 138  --  137  --   --   --  141  --   --  139 137  K 3.9   < > 3.5   < > 4.3  --   --   --  3.3*  --  3.7 3.8 4.9  CL 91*   < > 99  --  95*   --   --   --  94*  --   --  99 96*  CO2 34*   < > 30  --  32  --   --   --  34*  --   --  28 30  GLUCOSE 297*   < > 64*   < > 316*  --  178*  --  166*  --   --  182* 124*  BUN 50*   < > 11  --  22  --   --   --  43*  --   --  21 39*  CREATININE 6.34*   < > 1.72*  --  3.25*  --   --   --  4.41*  --   --  2.87* 4.16*  CALCIUM 8.5*   < > 6.9*   < > 7.0*   < >  --   --  7.1*  --   --  8.0* 8.6*  MG 3.3*  --   --   --  1.9  --   --  1.8 1.8 1.7  --   --   --   PHOS 8.9*   < > 2.5  --  4.8*  --   --  5.9* 6.3* 2.1*  --   --   --    < > = values in this interval not displayed.    CBC: Recent Labs  Lab 08/01/19 1208 08/01/19 1211 08/01/19 1400 08/02/19  0015 08/03/19 0346 08/04/19 0331 08/05/19 0500  WBC 9.1   < > 13.4* 17.4* 16.6* 14.9* 17.1*  NEUTROABS 8.8*  --   --   --   --   --   --   HGB 8.5*   < > 8.0* 7.4* 7.2* 8.0* 7.7*  HCT 28.5*   < > 26.7* 25.1* 24.3* 27.0* 25.7*  MCV 96.3   < > 95.0 96.9 98.4 100.0 98.5  PLT 125*   < > 113* 102* 101* 120* 129*   < > = values in this interval not displayed.    CBG: Recent Labs  Lab 08/04/19 1552 08/04/19 1954 08/04/19 2332 08/05/19 0502 08/05/19 0725  GLUCAP 168* 140* 96 117* 143*    Imaging: DG Forearm Right  Result Date: 08/04/2019 CLINICAL DATA:  Fall, right-sided weakness EXAM: RIGHT FOREARM - 2 VIEW COMPARISON:  None. FINDINGS: No fracture or malalignment. Fat pad distention at the elbow consistent with an effusion. Prominent posterior elbow soft tissue swelling. Probable cartilaginous calcification at the wrist. IMPRESSION: No definitive fracture is seen, however there is an elbow effusion and occult fracture cannot be excluded. Electronically Signed   By: Donavan Foil M.D.   On: 08/04/2019 19:33   DG Tibia/Fibula Right  Result Date: 08/04/2019 CLINICAL DATA:  [place] during PT profound right-sided weakness EXAM: RIGHT TIBIA AND FIBULA - 2 VIEW COMPARISON:  None. FINDINGS: No acute displaced fracture or malalignment. Vascular  calcifications. Soft tissues are unremarkable. IMPRESSION: No acute osseous abnormality. Electronically Signed   By: Donavan Foil M.D.   On: 08/04/2019 19:30   CT HEAD WO CONTRAST  Result Date: 08/04/2019 CLINICAL DATA:  New right-sided weakness EXAM: CT HEAD WITHOUT CONTRAST TECHNIQUE: Contiguous axial images were obtained from the base of the skull through the vertex without intravenous contrast. COMPARISON:  08/02/2019 FINDINGS: Brain: There is no acute intracranial hemorrhage, mass effect, or edema. There is no definite new loss of gray-white differentiation. As before, there are multifocal chronic infarcts involving bilateral cerebral hemispheres including deep gray nuclei and bilateral cerebellar hemispheres. Additional patchy and confluent hypoattenuation in the supratentorial white matter likely reflects advanced chronic microvascular ischemic change. Prominence of the ventricles and sulci reflects stable parenchymal volume loss. Vascular: There is atherosclerotic calcification at the skull base. Skull: Calvarium is unremarkable. Sinuses/Orbits: No acute finding. Other: None. IMPRESSION: No acute intracranial hemorrhage or evidence of acute infarction. Small new infarct difficult to exclude due to extensive chronic ischemic changes. Electronically Signed   By: Macy Mis M.D.   On: 08/04/2019 19:39   DG Humerus Right  Result Date: 08/04/2019 CLINICAL DATA:  Right-sided weakness history of fall EXAM: RIGHT HUMERUS - 2+ VIEW COMPARISON:  None. FINDINGS: There is no evidence of fracture or other focal bone lesions. Soft tissues are unremarkable. IMPRESSION: Negative. Electronically Signed   By: Donavan Foil M.D.   On: 08/04/2019 19:31   DG FEMUR, MIN 2 VIEWS RIGHT  Result Date: 08/04/2019 CLINICAL DATA:  Fall, right-sided weakness EXAM: RIGHT FEMUR 2 VIEWS COMPARISON:  None. FINDINGS: No fracture or malalignment. Vascular calcifications. Edema within the soft tissues. IMPRESSION: No acute osseous  abnormality. Electronically Signed   By: Donavan Foil M.D.   On: 08/04/2019 19:34       Laurey Morale, MSN, NP-C [facility] [phone]  08/05/2019, 9:14 AM   Attending physician note to follow with Assessment and plan . I have seen the patient reviewed the above note.  Assessment: 69 y.o. female with PMH a. Fib, ESRD (  on dialysis t/TH/ Sa), HTN, HLD who presented to [place] and was transferred rto [facility] s/p arrest. Extubated 7/7 and c/o right arm weakness. CTH revealed a subacute stroke. MRI is pending. Complete stroke work- up recommended.   She is not on anticoagulation or antithrombotic candidate due to recent GI bleed and subdural hematoma.  Stroke Risk Factors - atrial fibrillation, hyperlipidemia, hypertension and smoking     Recommendations: -- BP goal : goal is to normalize BP < 140/90 by discharge. --MRI Brain  -- CTA head and neck preferred (if cleared by nephrology) -- Prophylactic therapy-Antiplatelet deferred due to recent GI bleed and subdural hematoma. -- High intensity Statin if LDL > 70 -- HgbA1c, fasting lipid panel -- PT consult, OT consult, Speech consult --Telemetry monitoring --Frequent neuro checks --Stroke swallow screen   Addendum: CTA: showed no LVO New DVT in right common femoral vein; IVC filter placed.  --Please page the Stroke team from 8am-4pm.   You can look them up on [email or web address]    Roland Rack, MD [facility] [phone]  If 7pm- 7am, please page neurology on call as listed in [facility].

## 2019-08-05 NOTE — Sedation Documentation (Signed)
Pt sleeping at this time.

## 2019-08-05 NOTE — Procedures (Signed)
  Procedure: IVCgram and filter placement (retrievable) EBL:   minimal Complications:  none immediate  See full dictation in [facility] PACS.  D. Gianlucca Szymborski MD Main # [phone] Pager  [phone]   

## 2019-08-05 NOTE — Plan of Care (Signed)
Discussed with patient plan of care for the evening, pain management and low blood sugars with some teach back displayed. Encouraged patient to drink Nephro/Carb Steady and eat a snack to keep blood glucose levels up.

## 2019-08-05 NOTE — Care Management Important Message (Signed)
Important Message  Patient Details  Name: Christina [name] MRN: [id number] Date of Birth: Nov 05, 1950   Medicare Important Message Given:  Yes     Shelda Altes 08/05/2019, 8:32 AM

## 2019-08-05 NOTE — Sedation Documentation (Signed)
Vital signs stable. 

## 2019-08-05 NOTE — Progress Notes (Signed)
Upper & lower extremity venous has been completed.   Preliminary results in CV Proc.   Abram Sander 08/05/2019 2:17 PM

## 2019-08-05 NOTE — Progress Notes (Signed)
Inpatient Rehab Admissions Coordinator Note:   Per therapy recommendations, pt was screened for CIR candidacy by Shann Medal, PT, DPT.  At this time we are recommending a CIR consult.  IF pt would like to be considered, please place a CIR consult order.  Please contact me with questions.   Shann Medal, PT, DPT [phone] 08/05/19 9:26 AM

## 2019-08-05 NOTE — Sedation Documentation (Signed)
Pt arrived to radiology nurses station awake and alert. She is able to state her name and birthday. She is not aware of the date/time/proximity at this time. Consent for procedure signed by daughter. Dr Vernard Gambles in to speak with pt. She states that she has chest pain but it is the same pain she has had even before coming into the hospital, "it is not new pain."

## 2019-08-06 ENCOUNTER — Other Ambulatory Visit (HOSPITAL_COMMUNITY): Payer: Self-pay | Admitting: Interventional Radiology

## 2019-08-06 ENCOUNTER — Inpatient Hospital Stay (HOSPITAL_COMMUNITY): Payer: Medicare Other

## 2019-08-06 DIAGNOSIS — Z95828 Presence of other vascular implants and grafts: Secondary | ICD-10-CM

## 2019-08-06 DIAGNOSIS — I6340 Cerebral infarction due to embolism of unspecified cerebral artery: Secondary | ICD-10-CM

## 2019-08-06 LAB — CULTURE, BLOOD (ROUTINE X 2)
Culture: NO GROWTH
Culture: NO GROWTH
Special Requests: ADEQUATE

## 2019-08-06 LAB — CBC
HCT: 25.1 % — ABNORMAL LOW (ref 36.0–46.0)
Hemoglobin: 7.3 g/dL — ABNORMAL LOW (ref 12.0–15.0)
MCH: 28.7 pg (ref 26.0–34.0)
MCHC: 29.1 g/dL — ABNORMAL LOW (ref 30.0–36.0)
MCV: 98.8 fL (ref 80.0–100.0)
Platelets: 133 10*3/uL — ABNORMAL LOW (ref 150–400)
RBC: 2.54 MIL/uL — ABNORMAL LOW (ref 3.87–5.11)
RDW: 19.2 % — ABNORMAL HIGH (ref 11.5–15.5)
WBC: 10.3 10*3/uL (ref 4.0–10.5)
nRBC: 0.2 % (ref 0.0–0.2)

## 2019-08-06 LAB — COMPREHENSIVE METABOLIC PANEL
ALT: 145 U/L — ABNORMAL HIGH (ref 0–44)
AST: 35 U/L (ref 15–41)
Albumin: 2 g/dL — ABNORMAL LOW (ref 3.5–5.0)
Alkaline Phosphatase: 75 U/L (ref 38–126)
Anion gap: 11 (ref 5–15)
BUN: 52 mg/dL — ABNORMAL HIGH (ref 8–23)
CO2: 28 mmol/L (ref 22–32)
Calcium: 7.6 mg/dL — ABNORMAL LOW (ref 8.9–10.3)
Chloride: 93 mmol/L — ABNORMAL LOW (ref 98–111)
Creatinine, Ser: 5.52 mg/dL — ABNORMAL HIGH (ref 0.44–1.00)
GFR calc Af Amer: 8 mL/min — ABNORMAL LOW (ref >60)
GFR calc non Af Amer: 7 mL/min — ABNORMAL LOW (ref >60)
Glucose, Bld: 289 mg/dL — ABNORMAL HIGH (ref 70–99)
Potassium: 4.7 mmol/L (ref 3.5–5.1)
Sodium: 132 mmol/L — ABNORMAL LOW (ref 135–145)
Total Bilirubin: 1.1 mg/dL (ref 0.3–1.2)
Total Protein: 4.5 g/dL — ABNORMAL LOW (ref 6.5–8.1)

## 2019-08-06 LAB — GLUCOSE, CAPILLARY
Glucose-Capillary: 107 mg/dL — ABNORMAL HIGH (ref 70–99)
Glucose-Capillary: 108 mg/dL — ABNORMAL HIGH (ref 70–99)
Glucose-Capillary: 115 mg/dL — ABNORMAL HIGH (ref 70–99)
Glucose-Capillary: 170 mg/dL — ABNORMAL HIGH (ref 70–99)
Glucose-Capillary: 224 mg/dL — ABNORMAL HIGH (ref 70–99)
Glucose-Capillary: 61 mg/dL — ABNORMAL LOW (ref 70–99)
Glucose-Capillary: 81 mg/dL (ref 70–99)

## 2019-08-06 LAB — PROCALCITONIN: Procalcitonin: 1.45 ng/mL

## 2019-08-06 LAB — PREPARE RBC (CROSSMATCH)

## 2019-08-06 LAB — CORTISOL: Cortisol, Plasma: 12.7 ug/dL

## 2019-08-06 MED ORDER — DEXTROSE 50 % IV SOLN
INTRAVENOUS | Status: AC
  Administered 2019-08-06: 50 mL
  Filled 2019-08-06: qty 50

## 2019-08-06 MED ORDER — DEXTROSE 10 % IV SOLN: INTRAVENOUS | Status: DC

## 2019-08-06 MED ORDER — SODIUM CHLORIDE 0.9% IV SOLUTION: Freq: Once | INTRAVENOUS | Status: DC

## 2019-08-06 MED ORDER — ATORVASTATIN CALCIUM 40 MG PO TABS
40.0000 mg | ORAL_TABLET | Freq: Every day | ORAL | Status: DC
  Administered 2019-08-06 – 2019-08-11 (×6): 40 mg via ORAL
  Filled 2019-08-06 (×6): qty 1

## 2019-08-06 MED ORDER — AMLODIPINE BESYLATE 10 MG PO TABS
10.0000 mg | ORAL_TABLET | Freq: Every day | ORAL | Status: DC
  Administered 2019-08-06 – 2019-08-12 (×7): 10 mg via ORAL
  Filled 2019-08-06 (×7): qty 1

## 2019-08-06 MED ORDER — DEXTROSE 50 % IV SOLN
25.0000 mL | Freq: Once | INTRAVENOUS | Status: AC
  Administered 2019-08-06: 25 mL via INTRAVENOUS
  Filled 2019-08-06: qty 50

## 2019-08-06 MED ORDER — SODIUM CHLORIDE 0.9 % IV SOLN
2.0000 g | INTRAVENOUS | Status: DC
  Administered 2019-08-06: 2 g via INTRAVENOUS
  Filled 2019-08-06: qty 2

## 2019-08-06 MED ORDER — METRONIDAZOLE IN NACL 5-0.79 MG/ML-% IV SOLN
500.0000 mg | Freq: Three times a day (TID) | INTRAVENOUS | Status: AC
  Administered 2019-08-06 – 2019-08-10 (×13): 500 mg via INTRAVENOUS
  Filled 2019-08-06 (×15): qty 100

## 2019-08-06 NOTE — Progress Notes (Signed)
PT Cancellation Note  Patient Details Name: Christina [name] MRN: [id number] DOB: 01/11/1951   Cancelled Treatment:    Reason Eval/Treat Not Completed: Patient at procedure or test/unavailable   [name] B [name] 08/06/2019, 11:49 AM  Bayard Males, PT Acute Rehabilitation Services Pager: [phone] Office: [phone]

## 2019-08-06 NOTE — Progress Notes (Signed)
STROKE TEAM PROGRESS NOTE   INTERVAL HISTORY She was seen at HD unit. Pt lying in bed, lethargic, orientated to place and age but not to time. Complains of right leg pain due to surface wound and DVT. Had IVC filter placed. Still has anemia and Hb dropped to 7.3 this am. Overnight had hypoglycemia, now stabilized.   OBJECTIVE Vitals:   08/06/19 0550 08/06/19 0551 08/06/19 0802 08/06/19 0805  BP:   (!) 154/72   Pulse:   88   Resp:  16 19   Temp:   98.4 F (36.9 C)   TempSrc:   Oral   SpO2: 95% 100%  100%  Weight:      Height:        CBC:  Recent Labs  Lab 08/01/19 1208 08/01/19 1211 08/05/19 0500 08/06/19 0540  WBC 9.1   < > 17.1* 10.3  NEUTROABS 8.8*  --   --   --   HGB 8.5*   < > 7.7* 7.3*  HCT 28.5*   < > 25.7* 25.1*  MCV 96.3   < > 98.5 98.8  PLT 125*   < > 129* 133*   < > = values in this interval not displayed.    Basic Metabolic Panel:  Recent Labs  Lab 08/03/19 0346 08/03/19 1621 08/03/19 2033 08/05/19 0500 08/06/19 0540  NA 141  --    < > 137 132*  K 3.3*  --    < > 4.9 4.7  CL 94*  --    < > 96* 93*  CO2 34*  --    < > 30 28  GLUCOSE 166*  --    < > 124* 289*  BUN 43*  --    < > 39* 52*  CREATININE 4.41*  --    < > 4.16* 5.52*  CALCIUM 7.1*  --    < > 8.6* 7.6*  MG 1.8 1.7  --   --   --   PHOS 6.3* 2.1*  --   --   --    < > = values in this interval not displayed.    Lipid Panel:     Component Value Date/Time   CHOL 86 06/19/2019 0312   TRIG 92 06/19/2019 0312   HDL 37 (L) 06/19/2019 0312   CHOLHDL 2.3 06/19/2019 0312   VLDL 18 06/19/2019 0312   LDLCALC 31 06/19/2019 0312   HgbA1c:  Lab Results  Component Value Date   HGBA1C 4.9 06/19/2019   Urine Drug Screen:     Component Value Date/Time   LABOPIA NONE DETECTED 07/02/2019 0458   COCAINSCRNUR NONE DETECTED 07/02/2019 0458   LABBENZ NONE DETECTED 07/02/2019 0458   AMPHETMU NONE DETECTED 07/02/2019 0458   THCU NONE DETECTED 07/02/2019 0458   LABBARB NONE DETECTED 07/02/2019 0458     Alcohol Level     Component Value Date/Time   ETH <10 04/23/2019 2314    IMAGING  CT ANGIO HEAD W OR WO CONTRAST CT ANGIO NECK W OR WO CONTRAST 08/05/2019 IMPRESSION:  No substantial change since 06/19/2019. There is irregular noncalcified plaque along the aortic arch. Stable findings of stenosis including left common carotid artery, intracranial right vertebral artery, intracranial ICAs, and left A2 ACA. Persistent pleural effusions and associated atelectasis.  CT HEAD WO CONTRAST 08/05/2019   IMPRESSION:  No acute intracranial hemorrhage or evidence of acute infarction. Small new infarct difficult to exclude due to extensive chronic ischemic changes. Stable trace falcine subdural hematoma.   MR  BRAIN WO CONTRAST 08/05/2019 IMPRESSION:  Numerous small acute infarcts involving multiple vascular territories bilaterally suggesting a central embolic etiology. Advanced chronic microvascular ischemic changes and multiple chronic infarcts.   DG Forearm Right 08/04/2019 IMPRESSION:  No definitive fracture is seen, however there is an elbow effusion and occult fracture cannot be excluded.    DG Tibia/Fibula Right 08/04/2019 IMPRESSION:  No acute osseous abnormality.   IR IVC FILTER PLMT / S&I /IMG GUID/MOD SED 08/06/2019 IMPRESSION:  1. Normal IVC. No thrombus or significant anatomic variation.  2. Technically successful infrarenal IVC filter placement. This is a retrievable model.  PLAN:  This IVC filter is potentially retrievable. The patient will be assessed for filter retrieval by Interventional Radiology in approximately 8-12 weeks. Further recommendations regarding filter retrieval, continued surveillance or declaration of device permanence, will be made at that time.  DG CHEST PORT 1 VIEW 08/06/2019 IMPRESSION:  Edema and pleural effusions which have progressed from 4 days ago.  DG Humerus Right 08/04/2019 IMPRESSION:  Negative.    DG FEMUR, MIN 2 VIEWS RIGHT   08/04/2019 IMPRESSION:  No acute osseous abnormality.   VAS [place] LOWER EXTREMITY VENOUS (DVT) 08/05/2019 Summary:  RIGHT: - Findings consistent with age indeterminate deep vein thrombosis involving the right common femoral vein, SF junction, right femoral vein, right proximal profunda vein, right popliteal vein, and right posterior tibial veins. - Findings consistent with age indeterminate superficial vein thrombosis involving the right great saphenous vein.     VAS [place] UPPER EXTREMITY VENOUS DUPLEX 08/05/2019 Summary:   Right: No evidence of deep vein thrombosis in the upper extremity. No evidence of superficial vein thrombosis in the upper extremity.    Transthoracic Echocardiogram  08/01/2019 IMPRESSIONS  1. Left ventricular ejection fraction, by estimation, is 55 to 60%. The  left ventricle has normal function. The left ventricle has no regional  wall motion abnormalities. There is the interventricular septum is mildly  flattened in systole and diastole,  consistent with right ventricular pressure and volume overload.  2. Right ventricular systolic function is mildly reduced. The right  ventricular size is normal. There is severely elevated pulmonary artery  systolic pressure. The estimated right ventricular systolic pressure is  63.8 mmHg.  3. Left atrial size was mildly dilated.  4. The mitral valve is abnormal. Mild mitral valve regurgitation.  5. Tricuspid valve regurgitation is mild to moderate.  6. The aortic valve is tricuspid. Aortic valve regurgitation is trivial.  Mild aortic valve sclerosis is present, with no evidence of aortic valve  stenosis.  7. The inferior vena cava is normal in size with <50% respiratory  variability, suggesting right atrial pressure of 8 mmHg.    ECG - atrial fibrillation - ventricular response 140 BPM (See cardiology reading for complete details)   PHYSICAL EXAM  Temp:  [97.4 F (36.3 C)-98.5 F (36.9 C)] 97.4 F (36.3 C) (07/10  1508) Pulse Rate:  [55-88] 67 (07/10 1508) Resp:  [13-23] 20 (07/10 1508) BP: (114-208)/(36-107) 131/48 (07/10 1508) SpO2:  [91 %-100 %] 100 % (07/10 1508) Weight:  [46.2 kg-49.3 kg] 46.2 kg (07/10 1508)  General - Well nourished, well developed, lethargic.  Ophthalmologic - fundi not visualized due to noncooperation.  Cardiovascular - Regular rhythm and rate, not in afib.  Neuro - awake alert and eyes open. Orientated to place and age but not to time. Mild dysarthria and hypophonia, but no aphasia and follows simple commands. Naming 2/3 and able to repeat. Visual field full. No gaze palsy or nystagmus.  PERRL. EOMI. Facial symmetric. Tongue midline. BUE 3-/5, able to against gravity but drift. BLE 3-/5 proximal and 3/5 distally. Sensation symmetric, DTR 1+ and no babinski. L FTN slow but intact, R FTN not able to perform due to weakness. Gait not tested.   ASSESSMENT/PLAN Ms. Christina [name] is a 69 y.o. female with history of  ESRD (on dialysis T/Th/ Sa), HTN, HLD, A. Fib, COPD, hx of Gi bleed and CAD( 2 stents) who presented to [facility] on 7/5 with VT with a pulse in the ED -> cardioverted. She then went asystolic with 10 mins CPR before ROSC. K was > 7. She was extubated 7/7 and c/o right arm weakness and pain in right arm and leg.  She did not receive IV t-PA.  Stroke: multifocal, numerous small acute infarcts involving multiple vascular territories bilaterally suggesting a central embolic etiology - likely due to PAF not on [facility], cardiac arrest s/p CPR, as well as aortic arch mural thrombus or soft plaque  CT Head - 7/5 - trace acute SDH along the falx.  CT Head - 7/6 - Stable trace parafalcine SDH advanced chronic ischemia.  CT Head - 7/8 - Stable trace falcine subdural hematoma  CT head - 7/9 - No acute intracranial hemorrhage or evidence of acute infarction. Small new infarct difficult to exclude due to extensive chronic ischemic changes. Stable trace falcine subdural  hematoma.   MRI head - Numerous small acute infarcts involving multiple vascular territories bilaterally suggesting a central embolic etiology. Advanced chronic microvascular ischemic changes and multiple chronic infarcts.   CTA H&N - No substantial change since 06/19/2019. There is irregular noncalcified plaque along the aortic arch. Stable findings of stenosis including left common carotid artery, intracranial right vertebral artery, intracranial ICAs, and left A2 ACA. Persistent pleural effusions and associated atelectasis.  2D Echo - EF 55 - 60%. No cardiac source of emboli identified.   S[place] Corona Virus 2 - negative  LDL - 31 (05/2019)  HgbA1c - 4.9 (05/2019)  UDS - negative  VTE prophylaxis - SCDs  aspirin 81 mg daily and clopidogrel 75 mg daily prior to admission, now on aspirin 81 mg daily. Discussed with Dr. Candiss Norse, pt not candidate for [facility] at this time due to small SDH, severe anemia requiring PRBC, as well as noncompliance. Will recommend to repeat CT in one week, if SDH resolves, will recommend to resume plavix. Pt will follow up with Dr. Leonie Man at [facility] to consider [facility] once appropriate.   Patient counseled to be compliant with her antithrombotic medications  Ongoing aggressive stroke risk factor management  Therapy recommendations:  CIR  Disposition:  Pending  PAF  Currently SNR  On coreg  Not candidate for [facility] at this time  On ASA  Will need close follow up with Dr. Leonie Man at 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 Leonie Man at [facility] to consider [facility] once appropriate  May repeat CTA in 2-3 month for monitoring  Cardiac arrest   VT in the setting of hyperkalemia s/p cardioversion  S/p CPR with ROSC  On  coreg and ASA  Close monitoring  Management per primary team  Hypertension  Home BP meds: metoprolol ;  imdur  Current BP meds: Coreg ; Norvasc  Stable . SBP goal < 160 mm Hg due to recent SDH . Long-term BP goal normotensive  Hyperlipidemia  Home Lipid lowering medication: Pravachol 80 mg daily  LDL 31, goal < 70  Current lipid lowering medication: Lipitor 40 mg daily   Continue statin at discharge  Other Stroke Risk Factors  Advanced age  Former cigarette smoker - quit  Previous ETOH   Coronary artery disease  Previous marijuana use  Previous strokes by imaging  Other Active Problems  Code status - DNR  ESRD on dialysis  Anemia of chronic disease - Hgb - 8.0->7.7->7.3  Thrombocytopenia, platelet 101->129->133  Hospital day # 5  Neurology will sign off. Please call with questions. Pt will follow up with stroke clinic Dr. Leonie Man at [facility] in about 4 weeks. Thanks for the consult.  Rosalin Hawking, MD PhD Stroke Neurology 08/06/2019 4:28 PM   To contact Stroke Continuity provider, please refer to [email or web address] After hours, contact General Neurology

## 2019-08-06 NOTE — Progress Notes (Signed)
°  Speech Language Pathology Treatment: Dysphagia  Patient Details Name: Christina [name] MRN: [id number] DOB: Jan 02, 1951 Today's Date: 08/06/2019 Time: 5374-8270 SLP Time Calculation (min) (ACUTE ONLY): 16 min  Assessment / Plan / Recommendation Clinical Impression  Pt completing late breakfast with tech feeding when therapist arrived. Appears to have bilateral weakness (right>left) but was unable to efficiently grasp utensil to bring to oral cavity. Significant pharyngeal congestion throughout and she achieves hard coughs although incomplete clearance. Coughing prevailed without relation to specific texture although there is a possibility of decreased airway protection given SDH. Eructation present again today and noted recent esophageal plaques. She stated that the "twisting sensation" experiencing yesterday is gone. Will follow up if clinical signs warrant instrumental assessment, will recommend at that time. Continue reg/thin liquids, pills whole in puree, stay upright after meals    HPI HPI: Pt is a 69 y/o female who presented to [facility] on 7/5 and was subsequently transferred to [facility]. While in ED, she was noted to have VT with a pulse and was cardioverted, became asystolic with 10 minutes of CPR before ROSC.  Post CPR, the patient was reportedly awake/ pulling at ETT and was sedated. Pt extubated on 7/7. CXr 7/6: cardiomegaly and interstitial opacities suspicious for pulmonary edema. Possible small left pleural effusion. CT head: Stable trace parafalcine subdural hematoma. BSE 03/2019: WNL. EGD 6/8 showed multiple plagues in the mid and distal esophagus, smal HH, two non-bleeding duodenal ulcers.       SLP Plan  Continue with current plan of care       Recommendations  Diet recommendations: Regular;Thin liquid Liquids provided via: Straw;Cup Medication Administration: Whole meds with liquid Supervision: Staff to assist with self feeding;Full supervision/cueing for  compensatory strategies Compensations: Slow rate;Small sips/bites Postural Changes and/or Swallow Maneuvers: Seated upright 90 degrees;Upright 30-60 min after meal                Oral Care Recommendations: Oral care BID Plan: Continue with current plan of care       GO                Christina [name] 08/06/2019, 11:22 AM   Christina [name].Ed Risk analyst [phone] Office [phone]

## 2019-08-06 NOTE — Progress Notes (Signed)
PROGRESS NOTE                                                                                                                                                                                                             Patient Demographics:    Christina [name], is a 69 y.o. female, DOB - 29-Apr-1950, LVD:[id number]  Admit date - 08/01/2019   Admitting Physician Garner Nash, DO  Outpatient Primary MD for the patient is Nodal, [name], PA-C  LOS - 5  CC - Passed out     Brief Narrative - 69 year old Caucasian female with history of ESRD with noncompliance with dialysis on multiple occasions, CAD s/p 2 stents in the past, SMA stent in May 2021, left carotid endarterectomy in the past, hypertension, dyslipidemia, alcohol abuse, GI bleed, COPD, GERD, history of duodenal ulcers found on EGD in June 2021, anemia of chronic disease, medication noncompliance who was brought to [facility] ER after she had an asystolic arrest at home with 10-minute downtime requiring CPR thought to be due to hyperkalemia.  She was subsequently intubated and transferred to [facility] under pulmonary critical care, while under the care of pulmonary critical care she developed right-sided weakness arm more than leg on 08/04/2019.  Imaging studies of the brain showed stable small subdural hematoma, she was stabilized extubated and transferred to my service on 08/05/2019 on day 4 of her hospital stay.   Subjective:   Patient in bed appears more somnolent today, has audible gurgling, wakes up to painful stimuli, denies any headache or chest pain.   Assessment  & Plan :     1.  Cardiac arrest with subsequent respiratory failure due to unresponsive state caused by hyperkalemia due to noncompliance with dialysis.  She has been dialyzed, potassium stable, has been extensively counseled on compliance, also discussed this with her [name] over the phone on 08/05/2019.  Echo shows stable EF of around 55%  with no regional wall motion abnormality.  Continue monitoring with supportive care.  2.  CAD s/p stent in the remote past, history of atrial fibrillation, PAD with history of SMA stent, left carotid endarterectomy.  Will place her on statin and Coreg for now for secondary prevention, currently on baby aspirin only Plavix held due to recent subdural hematoma.  3.  ESRD.  TTS schedule.  Nephrology on board.  Counseled on compliance.  4.  Acute metabolic encephalopathy post cardiac arrest.  Worse this morning after an episode of hypoglycemia last night,  supportive care and monitor.  5.  Recent subdural hematoma on CT scan, new right arm weakness x 2 days.  Neurology consulted, MRI brain along with CT angiogram head and neck ordered.  Statin for secondary prevention, baby aspirin only, will resume Plavix/anticoagulation if needed once cleared by neurology in relation to subdural hematoma.  6.  Essential hypertension.  Placed on Coreg.  Will monitor and adjust.  Once ischemic infarct is ruled out will be more aggressive with blood pressure control.  7.  Anemia and chronic thrombocytopenia.  Also has history of duodenal GI bleed in the past, likely has alcoholic cirrhosis as well.  Will transfuse 1 unit of packed RBC on 08/06/2019 due to symptomatic anemia.  Continue to monitor closely.  Continue PPI.  8.  Severe PCM.  Supportive care with nutritional supplements.  9.  History of paroxysmal A. fib.  On beta-blocker.  Severely noncompliant with medications.  Mali vas 2 score of greater than 4.  Not on anticoagulation prior to this admission likely due to severe noncompliance along with history of recent upper GI bleed.  Currently has new SDH as well.  10.  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  Cannot fully anticoagulate due to recent subdural hematoma and recent GI bleed.  Also very noncompliant with medications.  Received IVC filter by IR on 08/05/2019.  11.  DM type II.   Extremely brittle sugar control, despite being on before every meal very sensitive insulin scale she became hypoglycemic night of 08/05/2019.  D50 given, monitor CBGs closely.  Not on any other hypoglycemic agent.  12.  Possible aspiration pneumonia on 08/06/2019 due to encephalopathy.  Meropenem for now and monitor.  Will also request RT for deep NT suction.  Soft diet with feeding assistance and aspiration precautions.  Keep head of the bed at 35 degrees and monitor.   Overall prognosis is extremely poor due to multiple above-mentioned comorbidities and longstanding history of noncompliance.  Daughter was clearly explained on 08/05/2019 over the phone she understands this.  If any further decline we will transition towards comfort.  For now DNR.    Condition - Extremely Guarded  Family Communication  :  [name] [phone] on 08/05/2019  Code Status :  DNR  Consults  :  PCCM, Renal, Neuro  Procedures  :    CT Head 7/5 >> trace acute subdural hematoma along the falx, no other acute intracranial findings.  No CT evidence of anoxic injury  ECHO 7/5 >> LVEF 55-60%, no RWMA, interventricular septum is mildly flattened in systole & diastole consistent with RV & volume overload, RV systolic function is mildly reduced.  Severely elevated pulmonary artery systolic pressure, estimated RV systolic pressure is 33.8.  LA mildly dilated, mild to moderate TR  CT Head 7/6 & 7/7 >> stable trace parafalcine subdural hematoma, advanced chronic ischemic injury  ETT 7/5 >> 7/7   R Fem TLC 7/5 >> 7/5  L IJ CL 7/5 >>  Venous ultrasound.  Multiple right leg DVTs.  Likely subacute.    MRI brain - Numerous small acute infarcts involving multiple vascular territories bilaterally suggesting a central embolic etiology.  Advanced chronic microvascular ischemic changes and multiple chronic infarcts.   CT angiogram head and neck. No substantial change since 06/19/2019. There is irregular noncalcified plaque  along the aortic arch. Stable findings of stenosis including left common carotid artery, intracranial right vertebral artery, intracranial ICAs, and left A2 ACA. Persistent pleural effusions and associated  atelectasis.  IVC filter placement on 08/05/2019.   PUD Prophylaxis : PPI  Disposition Plan  :    Status is: Inpatient  Remains inpatient appropriate because:Unsafe d/c plan, work-up for CVA.   Dispo: The patient is from: Home              Anticipated d/c is to: SNF              Anticipated d/c date is: > 3 days              Patient currently is not medically stable to d/c.  DVT Prophylaxis  :    SCDs    Lab Results  Component Value Date   PLT 133 (L) 08/06/2019    Diet :  Diet Order            DIET SOFT Room service appropriate? Yes; Fluid consistency: Thin  Diet effective now                  Inpatient Medications Scheduled Meds: . sodium chloride   Intravenous Once  . amiodarone  200 mg Oral BID  . amLODipine  10 mg Oral Daily  . arformoterol  15 mcg Nebulization BID  . aspirin  81 mg Oral Daily  . atorvastatin  40 mg Oral Daily  . budesonide (PULMICORT) nebulizer solution  0.5 mg Nebulization BID  . carvedilol  6.25 mg Oral BID WC  . chlorhexidine gluconate (MEDLINE KIT)  15 mL Mouth Rinse BID  . Chlorhexidine Gluconate Cloth  6 each Topical Q0600  . Chlorhexidine Gluconate Cloth  6 each Topical Q0600  . feeding supplement (NEPRO CARB STEADY)  237 mL Oral TID BM  . gabapentin  300 mg Oral BID  . insulin aspart  0-6 Units Subcutaneous Q4H  . mouth rinse  15 mL Mouth Rinse BID  . multivitamin  1 tablet Oral QHS  . neomycin-bacitracin-polymyxin   Topical BID  . pantoprazole  40 mg Oral BID  . sodium chloride flush  10-40 mL Intracatheter Q12H   Continuous Infusions: . sodium chloride    . sodium chloride     PRN Meds:.sodium chloride, sodium chloride, acetaminophen, albuterol, alteplase, docusate sodium, guaiFENesin, hydrALAZINE, lidocaine (PF),  lidocaine-prilocaine, midodrine, ondansetron (ZOFRAN) IV, pentafluoroprop-tetrafluoroeth, polyethylene glycol, sodium chloride flush, traMADol  Antibiotics  :   Anti-infectives (From admission, onward)   None          Objective:   Vitals:   08/06/19 0550 08/06/19 0551 08/06/19 0802 08/06/19 0805  BP:   (!) 154/72   Pulse:   88   Resp:  16 19   Temp:   98.4 F (36.9 C)   TempSrc:   Oral   SpO2: 95% 100%  100%  Weight:      Height:        SpO2: 100 % O2 Flow Rate (L/min): 3 L/min FiO2 (%): 30 %  Wt Readings from Last 3 Encounters:  08/06/19 48.1 kg  07/14/19 40.5 kg  06/20/19 50.8 kg     Intake/Output Summary (Last 24 hours) at 08/06/2019 1011 Last data filed at 08/06/2019 0600 Gross per 24 hour  Intake 811.25 ml  Output 0 ml  Net 811.25 ml     Physical Exam  More somnolent this morning with audible gurgling, strength 1/5 in right upper arm, [place].AT,PERRAL Supple Neck,No JVD, No cervical lymphadenopathy appriciated.  Symmetrical Chest wall movement, Good air movement bilaterally, coarse bilateral breath sounds and gurgling RRR,No Gallops, Rubs or new Murmurs,  No Parasternal Heave +ve B.Sounds, Abd Soft, No tenderness, No organomegaly appriciated, No rebound - guarding or rigidity. Cyanosis in both feet with multiple toes cyanosed and has a chronic multiple dry black toe tips, large bruise anterior part of the right middle leg    Data Review:    Recent Labs  Lab 08/01/19 1208 08/01/19 1211 08/02/19 0015 08/03/19 0346 08/04/19 0331 08/05/19 0500 08/06/19 0540  WBC 9.1   < > 17.4* 16.6* 14.9* 17.1* 10.3  HGB 8.5*   < > 7.4* 7.2* 8.0* 7.7* 7.3*  HCT 28.5*   < > 25.1* 24.3* 27.0* 25.7* 25.1*  PLT 125*   < > 102* 101* 120* 129* 133*  MCV 96.3   < > 96.9 98.4 100.0 98.5 98.8  MCH 28.7   < > 28.6 29.1 29.6 29.5 28.7  MCHC 29.8*   < > 29.5* 29.6* 29.6* 30.0 29.1*  RDW 17.2*   < > 17.2* 17.6* 18.3* 19.0* 19.2*  LYMPHSABS 0.2*  --   --   --   --   --   --     MONOABS 0.1  --   --   --   --   --   --   EOSABS 0.0  --   --   --   --   --   --   BASOSABS 0.0  --   --   --   --   --   --    < > = values in this interval not displayed.    Recent Labs  Lab 08/01/19 1208 08/01/19 1211 08/01/19 1400 08/01/19 1400 08/02/19 0015 08/02/19 0015 08/02/19 0225 08/02/19 1845 08/03/19 0346 08/03/19 1621 08/03/19 2033 08/04/19 0331 08/05/19 0500 08/06/19 0540  NA 149*   < > 138   < > 137  --   --   --  141  --   --  139 137 132*  K 3.9   < > 3.5   < > 4.3   < >  --   --  3.3*  --  3.7 3.8 4.9 4.7  CL 91*   < > 99   < > 95*  --   --   --  94*  --   --  99 96* 93*  CO2 34*   < > 30   < > 32  --   --   --  34*  --   --  _0 GLUCOSE 297*   < > 64*   < > 316*   < > 178*  --  166*  --   --  182* 124* 289*  BUN 50*   < > 11   < > 22  --   --   --  43*  --   --  21 39* 52*  CREATININE 6.34*   < > 1.72*   < > 3.25*  --   --   --  4.41*  --   --  2.87* 4.16* 5.52*  CALCIUM 8.5*   < > 6.9*   < > 7.0*  --   --   --  7.1*  --   --  8.0* 8.6* 7.6*  AST 1,044*  --   --   --   --   --   --   --  305*  --   --   --  62* 35  ALT 561*  --   --   --   --   --   --   --  446*  --   --   --  222* 145*  ALKPHOS 79  --   --   --   --   --   --   --  73  --   --   --  86 75  BILITOT 1.6*  --   --   --   --   --   --   --  0.8  --   --   --  1.0 1.1  ALBUMIN 2.0*  --  1.9*  --   --   --   --   --  1.7*  --   --   --  2.1* 2.0*  MG 3.3*  --   --   --  1.9  --   --  1.8 1.8 1.7  --   --   --   --   PROCALCITON 0.68  --   --   --   --   --   --   --   --   --   --   --   --   --   INR 1.8*  --   --   --   --   --   --   --   --   --   --   --   --   --   BNP >4,500.0*  --   --   --   --   --   --   --   --   --   --   --   --   --    < > = values in this interval not displayed.    Recent Labs  Lab 08/01/19 1208  BNP >4,500.0*  PROCALCITON 0.68  SARSCOV2NAA NEGATIVE     ------------------------------------------------------------------------------------------------------------------ No results for input(s): CHOL, HDL, LDLCALC, TRIG, CHOLHDL, LDLDIRECT in the last 72 hours.  Lab Results  Component Value Date   HGBA1C 4.9 06/19/2019   ------------------------------------------------------------------------------------------------------------------ No results for input(s): TSH, T4TOTAL, T3FREE, THYROIDAB in the last 72 hours.  Invalid input(s): FREET3 ------------------------------------------------------------------------------------------------------------------ No results for input(s): VITAMINB12, FOLATE, FERRITIN, TIBC, IRON, RETICCTPCT in the last 72 hours.  Coagulation profile Recent Labs  Lab 08/01/19 1208  INR 1.8*    No results for input(s): DDIMER in the last 72 hours.  Cardiac Enzymes No results for input(s): CKMB, TROPONINI, MYOGLOBIN in the last 168 hours.  Invalid input(s): CK ------------------------------------------------------------------------------------------------------------------    Component Value Date/Time   BNP >4,500.0 (H) 08/01/2019 1208    Micro Results Recent Results (from the past 240 hour(s))  SARS Coronavirus 2 by RT PCR (hospital order, performed in [facility] hospital lab) Nasopharyngeal Nasopharyngeal Swab     Status: None   Collection Time: 08/01/19 12:08 PM   Specimen: Nasopharyngeal Swab  Result Value Ref Range Status   SARS Coronavirus 2 NEGATIVE NEGATIVE Final    Comment: (NOTE) SARS-CoV-2 target nucleic acids are NOT DETECTED.  The SARS-CoV-2 RNA is generally detectable in upper and lower respiratory specimens during the acute phase of infection. The lowest concentration of SARS-CoV-2 viral copies this assay can detect is 250 copies / mL. A negative result does not preclude SARS-CoV-2 infection and should not be used as the sole basis for treatment or other patient management decisions.  A  negative result may occur with improper specimen collection / handling, submission of specimen other than nasopharyngeal swab, presence of viral mutation(s) within the areas targeted by this assay, and inadequate number  of viral copies (<250 copies / mL). A negative result must be combined with clinical observations, patient history, and epidemiological information.  Fact Sheet for Patients:   StrictlyIdeas.no  Fact Sheet for Healthcare Providers: BankingDealers.co.za  This test is not yet approved or  cleared by the [place] FDA and has been authorized for detection and/or diagnosis of SARS-CoV-2 by FDA under an Emergency Use Authorization (EUA).  This EUA will remain in effect (meaning this test can be used) for the duration of the COVID-19 declaration under Section 564(b)(1) of the Act, 21 U.S.C. section 360bbb-3(b)(1), unless the authorization is terminated or revoked sooner.  Performed at [facility], [place] [address], [place], [place] [address]   Culture, blood (Routine X 2) w Reflex to ID Panel     Status: None (Preliminary result)   Collection Time: 08/01/19 12:44 PM   Specimen: BLOOD RIGHT ARM  Result Value Ref Range Status   Specimen Description BLOOD RIGHT ARM  Final   Special Requests   Final    BOTTLES DRAWN AEROBIC ONLY Blood Culture results may not be optimal due to an inadequate volume of blood received in culture bottles   Culture   Final    NO GROWTH 4 DAYS Performed at [facility], [place] [address], [place], [place] [address]    Report Status PENDING  Incomplete  MRSA PCR Screening     Status: None   Collection Time: 08/01/19 12:45 PM  Result Value Ref Range Status   MRSA by PCR NEGATIVE NEGATIVE Final    Comment:        The GeneXpert MRSA Assay (FDA approved for NASAL specimens only), is one component of a comprehensive MRSA colonization surveillance program. It is not intended to diagnose  MRSA infection nor to guide or monitor treatment for MRSA infections. Performed at [facility], [place] [address] [place], [place] [address]   Culture, blood (Routine X 2) w Reflex to ID Panel     Status: None (Preliminary result)   Collection Time: 08/01/19 12:48 PM   Specimen: BLOOD RIGHT WRIST  Result Value Ref Range Status   Specimen Description BLOOD RIGHT WRIST  Final   Special Requests   Final    BOTTLES DRAWN AEROBIC ONLY Blood Culture adequate volume   Culture   Final    NO GROWTH 4 DAYS Performed at [facility], [address] [place], [place] [address]    Report Status PENDING  Incomplete  Culture, respiratory (non-expectorated)     Status: None   Collection Time: 08/01/19  3:59 PM   Specimen: Tracheal Aspirate; Respiratory  Result Value Ref Range Status   Specimen Description TRACHEAL ASPIRATE  Final   Special Requests Normal  Final   Gram Stain   Final    FEW WBC PRESENT, PREDOMINANTLY PMN ABUNDANT GRAM POSITIVE COCCI RARE GRAM NEGATIVE RODS RARE GRAM POSITIVE RODS    Culture   Final    FEW Consistent with normal respiratory flora. Performed at [facility], [place] [address] [place],  [address]    Report Status 08/04/2019 FINAL  Final    Radiology Reports CT ANGIO HEAD W OR WO CONTRAST  Result Date: 08/05/2019 CLINICAL DATA:  Right-sided weakness EXAM: CT ANGIOGRAPHY HEAD AND NECK TECHNIQUE: Multidetector CT imaging of the head and neck was performed using the standard protocol during bolus administration of intravenous contrast. Multiplanar CT image reconstructions and MIPs were obtained to evaluate the vascular anatomy. Carotid  stenosis measurements (when applicable) are obtained utilizing NASCET criteria, using the distal internal carotid diameter as the denominator. CONTRAST:  39m OMNIPAQUE IOHEXOL 350 MG/ML SOLN COMPARISON:  06/19/2019 FINDINGS: CTA NECK Aortic arch: Calcified and irregular noncalcified plaque again identified along  the arch. Great vessel origins are patent. Right carotid system: Patent with multifocal calcified and noncalcified plaque. Stable less than 50% stenosis at the ICA origin. Left carotid system: Patent with multifocal calcified and noncalcified plaque. As before, there is approximately 60% stenosis of the common carotid at the level of the thyroid. No measurable stenosis at the ICA origin with evidence of prior endarterectomy. Vertebral arteries: Patent with calcified plaque again noted at the origins. Skeleton: Advanced multilevel degenerative changes. Other neck: No acute abnormality. Upper chest: Left greater than right pleural effusions with associated atelectasis. Review of the MIP images confirms the above findings CTA HEAD Anterior circulation: Intracranial internal carotid arteries are patent with calcified plaque causing up to moderate stenosis. Middle and anterior cerebral arteries are patent. Unchanged moderate stenosis of the distal left A2 ACA. Posterior circulation: Intracranial vertebral arteries are patent with calcified and noncalcified plaque. There is unchanged moderate stenosis of the right vertebral artery beyond the PICA origin. Basilar artery is patent with mild atherosclerotic irregularity. Posterior cerebral arteries are patent proximally with atherosclerotic irregularity. There is unchanged diminished opacification of the superior division of the distal left PCA. Venous sinuses: Patent as allowed by contrast bolus timing. Review of the MIP images confirms the above findings IMPRESSION: No substantial change since 06/19/2019. There is irregular noncalcified plaque along the aortic arch. Stable findings of stenosis including left common carotid artery, intracranial right vertebral artery, intracranial ICAs, and left A2 ACA. Persistent pleural effusions and associated atelectasis. Electronically Signed   By: PMacy MisM.D.   On: 08/05/2019 13:50   DG Chest 1 View  Result Date:  08/02/2019 CLINICAL DATA:  Central line placement. EXAM: CHEST  1 VIEW COMPARISON:  08/01/2019.  CT 05/22/2019. FINDINGS: Interim placement left subclavian line, its tip is at the cavoatrial junction. Endotracheal tube and NG tube in stable position. Cardiomegaly. No pulmonary venous congestion. Mild bilateral interstitial prominence again noted. A component of these changes may be chronic however interstitial edema and/or pneumonitis can not be excluded. Tiny left pleural effusion. No pneumothorax. Surgical clips noted over the neck. IMPRESSION: 1. Interim placement of left subclavian line, its tip is at the cavoatrial junction. Endotracheal tube and NG tube in stable position. 2. Cardiomegaly. No pulmonary venous congestion. Mild bilateral interstitial prominence again noted. A component of these changes may be chronic however interstitial edema and/or pneumonitis cannot be excluded. Tiny left pleural effusion. Electronically Signed   By: TMarcello Moores Register   On: 08/02/2019 06:10   DG Forearm Right  Result Date: 08/04/2019 CLINICAL DATA:  Fall, right-sided weakness EXAM: RIGHT FOREARM - 2 VIEW COMPARISON:  None. FINDINGS: No fracture or malalignment. Fat pad distention at the elbow consistent with an effusion. Prominent posterior elbow soft tissue swelling. Probable cartilaginous calcification at the wrist. IMPRESSION: No definitive fracture is seen, however there is an elbow effusion and occult fracture cannot be excluded. Electronically Signed   By: KDonavan FoilM.D.   On: 08/04/2019 19:33   DG Tibia/Fibula Right  Result Date: 08/04/2019 CLINICAL DATA:  FGolden Circleduring PT profound right-sided weakness EXAM: RIGHT TIBIA AND FIBULA - 2 VIEW COMPARISON:  None. FINDINGS: No acute displaced fracture or malalignment. Vascular calcifications. Soft tissues are unremarkable. IMPRESSION: No acute osseous abnormality. Electronically  Signed   By: Donavan Foil M.D.   On: 08/04/2019 19:30   CT HEAD WO CONTRAST  Addendum  Date: 08/05/2019   ADDENDUM REPORT: 08/05/2019 11:34 ADDENDUM: Stable trace falcine subdural hematoma Electronically Signed   By: Macy Mis M.D.   On: 08/05/2019 11:34   Result Date: 08/05/2019 CLINICAL DATA:  New right-sided weakness EXAM: CT HEAD WITHOUT CONTRAST TECHNIQUE: Contiguous axial images were obtained from the base of the skull through the vertex without intravenous contrast. COMPARISON:  08/02/2019 FINDINGS: Brain: There is no acute intracranial hemorrhage, mass effect, or edema. There is no definite new loss of gray-white differentiation. As before, there are multifocal chronic infarcts involving bilateral cerebral hemispheres including deep gray nuclei and bilateral cerebellar hemispheres. Additional patchy and confluent hypoattenuation in the supratentorial white matter likely reflects advanced chronic microvascular ischemic change. Prominence of the ventricles and sulci reflects stable parenchymal volume loss. Vascular: There is atherosclerotic calcification at the skull base. Skull: Calvarium is unremarkable. Sinuses/Orbits: No acute finding. Other: None. IMPRESSION: No acute intracranial hemorrhage or evidence of acute infarction. Small new infarct difficult to exclude due to extensive chronic ischemic changes. Electronically Signed: By: Macy Mis M.D. On: 08/04/2019 19:39   CT HEAD WO CONTRAST  Result Date: 08/02/2019 CLINICAL DATA:  Follow-up subdural hematoma EXAM: CT HEAD WITHOUT CONTRAST TECHNIQUE: Contiguous axial images were obtained from the base of the skull through the vertex without intravenous contrast. COMPARISON:  Yesterday FINDINGS: Brain: Trace high-density thickening left parafalcine at the vertex, not progressed. This is convincing for subdural hemorrhage when compared with a 06/17/2019 head CT. Extensive chronic small vessel ischemic injury with confluent gliosis in the cerebral white matter. Small remote bilateral cerebellar infarcts. Patchy remote cortical  infarcts along the cerebral convexities. Chronic lacune at the right caudate head. No hydrocephalus or masslike finding Vascular: No hyperdense vessel or unexpected calcification. Skull: Normal. Negative for fracture or focal lesion. Sinuses/Orbits: No acute finding. IMPRESSION: 1. Stable trace parafalcine subdural hematoma. 2. Advanced chronic ischemic injury. Electronically Signed   By: Monte Fantasia M.D.   On: 08/02/2019 06:14   CT HEAD WO CONTRAST  Addendum Date: 08/01/2019   ADDENDUM REPORT: 08/01/2019 13:43 ADDENDUM: Study discussed by telephone with Pulmonary/Critical Care Dr. Valeta Harms on 08/01/2019 at 1339 hours. Electronically Signed   By: Genevie Ann M.D.   On: 08/01/2019 13:43   Result Date: 08/01/2019 CLINICAL DATA:  69 year old female status post cardiac arrest. Dialysis patient. EXAM: CT HEAD WITHOUT CONTRAST TECHNIQUE: Contiguous axial images were obtained from the base of the skull through the vertex without intravenous contrast. COMPARISON:  CTA head and neck 06/19/2019. Head CT 06/17/2019 and earlier. FINDINGS: Brain: Chronic encephalomalacia in the left frontal lobe, left parietal lobe, right greater than left occipital lobes. Superimposed confluent bilateral white matter hypodensity. Chronic hypodensity scattered in the bilateral deep gray matter nuclei. Small chronic cerebellar infarcts again noted. No evidence of acute cerebral edema. No intracranial mass effect. However, there does appear to be a small or trace volume of subdural hematoma along the falx (coronal image 35). But no other acute intracranial hemorrhage identified. Vascular: Calcified atherosclerosis at the skull base. Skull: No acute osseous abnormality identified. Sinuses/Orbits: Visualized paranasal sinuses and mastoids are stable and well pneumatized. Other: Small volume of fluid in the visible pharynx with oral endotracheal and enteric tubes visible. No acute orbit or scalp soft tissue finding. IMPRESSION: 1. Trace acute subdural  hematoma along the falx. But no other acute intracranial hemorrhage and no intracranial mass effect.  2. No CT evidence of anoxic injury. Advanced chronic ischemic disease appears stable from the CTA on 06/19/2019. Electronically Signed: By: Genevie Ann M.D. On: 08/01/2019 13:28   CT ANGIO NECK W OR WO CONTRAST  Result Date: 08/05/2019 CLINICAL DATA:  Right-sided weakness EXAM: CT ANGIOGRAPHY HEAD AND NECK TECHNIQUE: Multidetector CT imaging of the head and neck was performed using the standard protocol during bolus administration of intravenous contrast. Multiplanar CT image reconstructions and MIPs were obtained to evaluate the vascular anatomy. Carotid stenosis measurements (when applicable) are obtained utilizing NASCET criteria, using the distal internal carotid diameter as the denominator. CONTRAST:  3m OMNIPAQUE IOHEXOL 350 MG/ML SOLN COMPARISON:  06/19/2019 FINDINGS: CTA NECK Aortic arch: Calcified and irregular noncalcified plaque again identified along the arch. Great vessel origins are patent. Right carotid system: Patent with multifocal calcified and noncalcified plaque. Stable less than 50% stenosis at the ICA origin. Left carotid system: Patent with multifocal calcified and noncalcified plaque. As before, there is approximately 60% stenosis of the common carotid at the level of the thyroid. No measurable stenosis at the ICA origin with evidence of prior endarterectomy. Vertebral arteries: Patent with calcified plaque again noted at the origins. Skeleton: Advanced multilevel degenerative changes. Other neck: No acute abnormality. Upper chest: Left greater than right pleural effusions with associated atelectasis. Review of the MIP images confirms the above findings CTA HEAD Anterior circulation: Intracranial internal carotid arteries are patent with calcified plaque causing up to moderate stenosis. Middle and anterior cerebral arteries are patent. Unchanged moderate stenosis of the distal left A2 ACA.  Posterior circulation: Intracranial vertebral arteries are patent with calcified and noncalcified plaque. There is unchanged moderate stenosis of the right vertebral artery beyond the PICA origin. Basilar artery is patent with mild atherosclerotic irregularity. Posterior cerebral arteries are patent proximally with atherosclerotic irregularity. There is unchanged diminished opacification of the superior division of the distal left PCA. Venous sinuses: Patent as allowed by contrast bolus timing. Review of the MIP images confirms the above findings IMPRESSION: No substantial change since 06/19/2019. There is irregular noncalcified plaque along the aortic arch. Stable findings of stenosis including left common carotid artery, intracranial right vertebral artery, intracranial ICAs, and left A2 ACA. Persistent pleural effusions and associated atelectasis. Electronically Signed   By: PMacy MisM.D.   On: 08/05/2019 13:50   MR BRAIN WO CONTRAST  Result Date: 08/05/2019 CLINICAL DATA:  Right-sided weakness EXAM: MRI HEAD WITHOUT CONTRAST TECHNIQUE: Multiplanar, multiecho pulse sequences of the brain and surrounding structures were obtained without intravenous contrast. COMPARISON:  06/17/2019, correlation made with more recent CT imaging FINDINGS: Motion artifact is present. Brain: There are multiple small foci reduced diffusion involving bilateral cerebral hemispheres, bilateral cerebellum and bilateral central gray nuclei. This is superimposed on multifocal chronic infarcts. There is trace subdural hemorrhage along the falx as seen on prior CT. Foci of susceptibility right frontal and parietal lobes compatible with chronic microhemorrhage or less likely mineralization. Confluent areas of T2 hyperintensity in the supratentorial white matter are nonspecific but probably reflect advanced chronic microvascular ischemic changes. There is no intracranial mass or mass effect. Vascular: Major vessel flow voids at the  skull base are preserved. Skull and upper cervical spine: Normal marrow signal is preserved. Sinuses/Orbits: Paranasal sinuses are aerated. Orbits are unremarkable. Other: Sella is unremarkable.  Mastoid air cells are clear. IMPRESSION: Numerous small acute infarcts involving multiple vascular territories bilaterally suggesting a central embolic etiology. Advanced chronic microvascular ischemic changes and multiple chronic infarcts. These results will  be called to the ordering clinician or representative by the Radiologist Assistant, and communication documented in the PACS or [employer]. Electronically Signed   By: Macy Mis M.D.   On: 08/05/2019 11:47   IR IVC FILTER PLMT / S&I Burke Keels GUID/MOD SED  Result Date: 08/06/2019 CLINICAL DATA:  Right lower extremity DVT. Intracranial hemorrhage, a relative contraindication to anticoagulation. Caval filtration requested. EXAM: INFERIOR VENACAVOGRAM IVC FILTER PLACEMENT UNDER FLUOROSCOPY FLUOROSCOPY TIME:  72 seconds; 17 mGy TECHNIQUE: Patency of the right IJ vein was confirmed with ultrasound with image documentation. An appropriate skin site was determined. Skin site was marked, prepped with chlorhexidine, and draped using maximum barrier technique. The region was infiltrated locally with 1% lidocaine. Intravenous Fentanyl 12.28mg were administered as conscious sedation during continuous monitoring of the patient's level of consciousness and physiological / cardiorespiratory status by the radiology RN, with a total moderate sedation time of 13 minutes. Under real-time ultrasound guidance, the right IJ vein was accessed with a 21 gauge micropuncture needle; the needle tip within the vein was confirmed with ultrasound image documentation. The needle was exchanged over a 018 guidewire for a transitional dilator, which allow advancement of the [facility] Centerwire into the IVC. A long 6 French vascular sheath was placed for inferior venacavography. This demonstrated no  caval thrombus. Renal vein inflows were evident. The [facility] LLCIVC filter was advanced through the sheath and successfully deployed under fluoroscopy at the L3 level. Followup cavagram demonstrates stable filter position and no evident complication. The sheath was removed and hemostasis achieved at the site. No immediate complication. IMPRESSION: 1. Normal IVC. No thrombus or significant anatomic variation. 2. Technically successful infrarenal IVC filter placement. This is a retrievable model. PLAN: This IVC filter is potentially retrievable. The patient will be assessed for filter retrieval by Interventional Radiology in approximately 8-12 weeks. Further recommendations regarding filter retrieval, continued surveillance or declaration of device permanence, will be made at that time. Electronically Signed   By: DLucrezia EuropeM.D.   On: 08/06/2019 08:23   PERIPHERAL VASCULAR CATHETERIZATION  Result Date: 07/13/2019 . Patient name: BWAUNETA SILVERIAMRN: [id number]: 409/02/1952Sex: female 07/12/2019 Pre-operative Diagnosis: right leg rest pain Post-operative diagnosis:  Same Surgeon:  WAnnamarie MajorProcedure Performed:  1.  Ultrasound-guided access, left femoral artery  2.  Shockwave intra-arterial lithotripsy, right superficial femoral artery  3.  Drug-coated balloon angioplasty, right superficial femoral artery  4.  Conscious sedation, 52 minutes  Indications: The patient underwent last week with intervention of the left leg.  She comes back today for right leg intervention. Procedure:  The patient was identified in the holding area and taken to room 8.  The patient was then placed supine on the table and prepped and draped in the usual sterile fashion.  A time out was called.  Conscious sedation was administered with the use of IV fentanyl and Versed under continuous physician and nurse monitoring.  Heart rate, blood pressure, and oxygen saturation were continuously monitored.  Total sedation time was 52 minutes.   Ultrasound was used to evaluate the left common femoral artery.  It was patent .  A digital ultrasound image was acquired.  A micropuncture needle was used to access the left common femoral artery under ultrasound guidance.  An 018 wire was advanced without resistance and a micropuncture sheath was placed.  The 018 wire was removed and a benson wire was placed.  The micropuncture sheath was exchanged for a 6 French 45 cm Terumo sheath.  A versa core wire was advanced over the aortic bifurcation which was followed by the 6 French sheath which was placed into the right superficial femoral artery.  The patient was then fully heparinized.  Next, right leg runoff was performed Findings:  Right Lower Extremity: Right common femoral and profundofemoral artery widely patent.  The superficial femoral artery is patent however the proximal third of the artery has luminal narrowing draining then 50%.  At the adductor canal there is a short segment lesion which is heavily calcified with greater than 70% stenosis.  The anterior tibial is the dominant runoff vessel.  Intervention: At this point, the patient was fully heparinized.  I advanced a 014 wire across the lesions into the below-knee popliteal artery.  I selected a 4 x 40 shockwave lithotripsy balloon and perform balloon angioplasty with lithotripsy in the superficial femoral artery for lesion length of approximately 200 cm.  Once this was completed at 2 and 4 atm on the balloon, a Ranger 4 x 200 balloon was inserted and drug-coated balloon angioplasty was performed for 3 minutes.  Completion imaging showed inline flow with residual stenosis less than 10%.  There was a nonflow limiting dissection at the proximal and distal edge of the intervention which I elected to monitor.  I did not want to put a stent in because of the small size of her arteries.  At this point sheath and wire were removed.  The patient particularly here for sheath electrocoagulation profile correct  Impression:  #1 successful shockwave intra-arterial lithotripsy and subsequent drug-coated balloon angioplasty using 4 mm balloons to the right superficial femoral artery with minimal residual stenosis.  There was a nonflow limiting dissection that was not intervened on.  Theotis Burrow, M.D., [facility] [place] Office: [phone] Pager:  [phone]  PERIPHERAL VASCULAR CATHETERIZATION  Result Date: 07/08/2019 Patient name: GREDMARIE DELANGE MRN: [id number] DOB: [birth year] Sex: female 07/08/2019 Pre-operative Diagnosis: Severe bilateral PAD Post-operative diagnosis:  Same Surgeon:  Annamarie Major Procedure Performed:  1.  Ultrasound-guided access, right femoral artery  2.  Abdominal aortogram  3.  Bilateral lower extremity runoff  4.  Shockwave intra-arterial lithotripsy, left superficial femoral artery  5.  Shockwave intra-arterial lithotripsy, left common iliac artery  6.  Drug-coated balloon angioplasty, left superficial femoral artery, left common iliac artery  7.  Stent, Left common iliac artery  8.  Stent, left external iliac artery  9.  Conscious sedation 122 minutes Indications: The patient has severe bilateral atherosclerotic vascular disease with ulceration of the left toes and rest pain.  She is here today for further evaluation, possible intervention Procedure:  The patient was identified in the holding area and taken to room 8.  The patient was then placed supine on the table and prepped and draped in the usual sterile fashion.  A time out was called.  Conscious sedation was administered with the use of IV fentanyl and Versed under continuous physician and nurse monitoring.  Heart rate, blood pressure, and oxygen saturation were continuously monitored.  Total sedation time was 122 minutes.  Ultrasound was used to evaluate the right common femoral artery.  It was patent .  A digital ultrasound image was acquired.  A micropuncture needle was used to access the right  common femoral artery under ultrasound guidance.  An 018 wire was advanced without resistance and a micropuncture sheath was placed.  The 018 wire was removed and a benson wire was placed.  The micropuncture sheath was exchanged  for a 5 french sheath.  An omniflush catheter was advanced over the wire to the level of L-1.  An abdominal angiogram was obtained.  Next, the catheter was pulled out of the aortic bifurcation and bilateral runoff was performed. Findings:  Aortogram: Bilateral renal arteries are widely patent.  The infrarenal abdominal aorta is heavily calcified.  There are areas of hypodensity in the mid infrarenal abdominal aorta.  Pressure gradients across this area were checked and found to not be significant.  The right common iliac artery is heavily calcified and irregular however no hemodynamically significant stenosis was identified.  The left common iliac artery has approximately a 50% stenosis at its origin and a 70% stenosis in the midportion.  There was also a greater than 50% stenosis at the origin of the left external iliac artery  Right Lower Extremity: The right common femoral and profundofemoral artery heavily calcified but patent.  The superficial femoral artery is patent with a greater than 90% stenosis in the midportion as well as a approximate 50% stenosis in the adductor canal.  The anterior tibial is the dominant runoff across the ankle however it does have a abrupt cut off as it crosses the ankle.  The peroneal artery is patent throughout its course.  The posterior tibial artery is occluded  Left Lower Extremity: The left common femoral and profundofemoral artery are calcified but patent without stenosis.  The superficial femoral artery is small in caliber.  There is a greater than 90% stenosis in the adductor canal.  The popliteal artery is widely patent.  There is two-vessel runoff via the posterior tibial and peroneal artery.  The anterior tibial artery is occluded. Intervention:  After the above images were acquired the decision made to proceed with intervention.  A 6 French 45 cm sheath was advanced into the left external iliac artery.  The patient was fully heparinized.  Using a 014 wire and a quick cross catheter the lesion in the left superficial femoral artery was crossed.  I then performed shockwave intra-arterial lithotripsy of this lesion for a total of 150 seconds using a 4 x 40 balloon.  I then performed drug-coated balloon angioplasty of this lesion using a 4 x 40 impact balloon.  The balloon was held for 2 atm.  Completion imaging showed a nonflow limiting dissection and also a small intimal flap.  I then reinserted the 4 x 40 balloon and repeated balloon angioplasty at a low atmospheres for 3 minutes.  Follow-up imaging showed near resolution of the intimal flap and improved appearance of the nonflow limiting dissection. Next attention was turned towards the common iliac artery.  I performed shockwave intra-arterial lithotripsy of this heavily calcified lesion using the same 4 x 40 balloon.  A total of 120 seconds were performed.  I then used a Ranger 6 x 40 drug-coated balloon for 3 minutes at nominal pressure.  Completion imaging showed persistent stenosis in this area and so I elected to primarily stent this.  This was done with a Herculink 8 x 22.  Follow-up imaging showed resolution of the common iliac stenosis however now the origin of the external iliac artery appeared greater than 70% and I felt that it needed to be addressed and so this was stented using a Abbott self-expanding 8 x 40 stent which was postdilated with a 7 mm balloon.  Completion imaging showed resolution of the stenosis within the left iliac system.  Catheters and wires were removed.  The long 6 French sheath was  exchanged out for a short 7 [place] sheath.  The patient be taken the holding area for sheath pull once her coagulation profile correct. Impression:  #1 greater than 70% left common iliac artery  stenosis treated with shockwave intra-arterial lithotripsy, drug-coated balloon, and ultimate stenting using an 8 x 22 balloon expandable stent.  #2 proximal left external iliac artery stenosis successfully treated using an 8 x 40 self-expanding stent  #3 near total occlusion of the superficial femoral artery on the left at the adductor canal treated using shockwave intra-arterial lithotripsy and drug-coated balloon angioplasty with a 4 mm balloon.  #4 two-vessel runoff via the posterior tibial and peroneal artery on the left  #5 there are tandem lesions that are hemodynamically significant in the right superficial femoral artery with two-vessel runoff.  6.  The patient will be brought back for percutaneous intervention of the right leg when she recovers from this procedure. Theotis Burrow, M.D., [facility] [place] Office: [phone] Pager:  [phone]  IR Removal Tun Cv Cath W/O FL  Result Date: 07/14/2019 INDICATION: End-stage renal disease on hemodialysis. Now with functional arteriovenous fistula. Request for removal hemodialysis catheter which was placed by vascular surgery. EXAM: REMOVAL OF TUNNELED HEMODIALYSIS CATHETER MEDICATIONS: 1% lidocaine 3 mL COMPLICATIONS: None immediate. PROCEDURE: Informed written consent was obtained from the patient following an explanation of the procedure, risks, benefits and alternatives to treatment. A time out was performed prior to the initiation of the procedure. Maximal barrier sterile technique was utilized including caps, mask, sterile gowns, sterile gloves, large sterile drape, hand hygiene, and ChloraPrep. 1% lidocaine with epinephrine was injected under sterile conditions along the subcutaneous tunnel. Utilizing a combination of blunt dissection and gentle traction, the catheter was removed intact. Hemostasis was obtained with manual compression. A dressing was placed. The patient tolerated the procedure well without immediate  post procedural complication. IMPRESSION: Successful removal of tunneled dialysis catheter. Read by: Gareth Eagle, PA-C Electronically Signed   By: Lucrezia Europe M.D.   On: 07/14/2019 11:51   DG CHEST PORT 1 VIEW  Result Date: 08/06/2019 CLINICAL DATA:  Follow-up pleural effusion EXAM: PORTABLE CHEST 1 VIEW COMPARISON:  Four days ago FINDINGS: Extubation of the trachea and esophagus. Left subclavian line with tip in good position. Interstitial opacity and probable layering pleural fluid which is increased. There is also hazy airspace disease. No pneumothorax. Cardiomegaly and vascular pedicle widening. IMPRESSION: Edema and pleural effusions which have progressed from 4 days ago. Electronically Signed   By: Monte Fantasia M.D.   On: 08/06/2019 08:56   DG CHEST PORT 1 VIEW  Result Date: 08/02/2019 CLINICAL DATA:  Endotracheal tube placement. EXAM: PORTABLE CHEST 1 VIEW COMPARISON:  Earlier this day. FINDINGS: Endotracheal tube tip 4.8 cm from the carina. Tip and side port of the enteric tube below the diaphragm in the stomach. Left subclavian central line unchanged in the SVC. Stable cardiomegaly. Unchanged mediastinal contours with aortic atherosclerosis. Interstitial thickening suspicious for pulmonary edema, similar. Minimal retrocardiac opacity and blunting of the costophrenic angle. There is no pneumothorax. IMPRESSION: 1. Endotracheal tube tip 4.8 cm from the carina. Enteric tube in place. 2. Unchanged cardiomegaly and interstitial opacities suspicious for pulmonary edema. Possible small left pleural effusion. Electronically Signed   By: Keith Rake M.D.   On: 08/02/2019 18:59   DG Chest Port 1 View  Result Date: 08/01/2019 CLINICAL DATA:  Intubation.  Assess for pneumothorax. EXAM: PORTABLE CHEST 1 VIEW COMPARISON:  August 01, 2019 7:58 a.m.  FINDINGS: Endotracheal tube is identified 5.2 cm from carina. There is no pleural line to suggest pneumothorax. Nasogastric tube is identified distal tip not  included on film but is at least in the stomach. Mild diffuse increased interstitium is identified bilaterally. Patchy opacities of both lung bases are noted. The heart size is mildly enlarged. The bony structures are stable. IMPRESSION: 1. Endotracheal tube 5.2 cm from carina. No pneumothorax. 2. Mild pulmonary edema. 3. Patchy opacities of both lung bases, superimposed pneumonia is not excluded. Electronically Signed   By: Abelardo Diesel M.D.   On: 08/01/2019 13:05   DG Humerus Right  Result Date: 08/04/2019 CLINICAL DATA:  Right-sided weakness history of fall EXAM: RIGHT HUMERUS - 2+ VIEW COMPARISON:  None. FINDINGS: There is no evidence of fracture or other focal bone lesions. Soft tissues are unremarkable. IMPRESSION: Negative. Electronically Signed   By: Donavan Foil M.D.   On: 08/04/2019 19:31   ECHOCARDIOGRAM COMPLETE  Result Date: 08/01/2019    ECHOCARDIOGRAM REPORT   Patient Name:   JEANETTA ALONZO Date of Exam: 08/01/2019 Medical Rec #:  [id number]          Height:       62.0 in Accession #:    [id number]         Weight:       89.3 lb Date of Birth:  1950/07/17           BSA:          1.357 m Patient Age:    67 years           BP:           153/69 mmHg Patient Gender: F                  HR:           49 bpm. Exam Location:  Inpatient Procedure: 2D Echo, Cardiac Doppler and Color Doppler Indications:    Cardiac arrest ([place]) [427.5.ICD-9-CM]  History:        Patient has prior history of Echocardiogram examinations, most                 recent 06/20/2019. CAD, Stroke and COPD, Arrythmias:Atrial                 Fibrillation, Signs/Symptoms:Chest Pain; Risk                 Factors:Hypertension, Dyslipidemia and Former Smoker.  Sonographer:    Vickie Epley RDCS Referring Phys: [id number] GRACE E BOWSER  Sonographer Comments: Echo performed with patient supine and on artificial respirator. IMPRESSIONS  1. Left ventricular ejection fraction, by estimation, is 55 to 60%. The left ventricle has normal function. The  left ventricle has no regional wall motion abnormalities. There is the interventricular septum is mildly flattened in systole and diastole, consistent with right ventricular pressure and volume overload.  2. Right ventricular systolic function is mildly reduced. The right ventricular size is normal. There is severely elevated pulmonary artery systolic pressure. The estimated right ventricular systolic pressure is 77.4 mmHg.  3. Left atrial size was mildly dilated.  4. The mitral valve is abnormal. Mild mitral valve regurgitation.  5. Tricuspid valve regurgitation is mild to moderate.  6. The aortic valve is tricuspid. Aortic valve regurgitation is trivial. Mild aortic valve sclerosis is present, with no evidence of aortic valve stenosis.  7. The inferior vena cava is normal in size with <50% respiratory variability, suggesting right atrial pressure  of 8 mmHg. FINDINGS  Left Ventricle: Left ventricular ejection fraction, by estimation, is 55 to 60%. The left ventricle has normal function. The left ventricle has no regional wall motion abnormalities. The left ventricular internal cavity size was normal in size. There is  borderline left ventricular hypertrophy. The interventricular septum is flattened in systole and diastole, consistent with right ventricular pressure and volume overload. Left ventricular diastolic parameters are consistent with Grade I diastolic dysfunction (impaired relaxation). Right Ventricle: The right ventricular size is normal. No increase in right ventricular wall thickness. Right ventricular systolic function is mildly reduced. There is severely elevated pulmonary artery systolic pressure. The tricuspid regurgitant velocity is 3.67 m/s, and with an assumed right atrial pressure of 8 mmHg, the estimated right ventricular systolic pressure is 15.4 mmHg. Left Atrium: Left atrial size was mildly dilated. Right Atrium: Right atrial size was normal in size. Pericardium: There is no evidence of  pericardial effusion. Mitral Valve: The mitral valve is abnormal. There is mild thickening of the mitral valve leaflet(s). There is mild calcification of the mitral valve leaflet(s). Mild to moderate mitral annular calcification. Mild mitral valve regurgitation. Tricuspid Valve: The tricuspid valve is grossly normal. Tricuspid valve regurgitation is mild to moderate. Aortic Valve: The aortic valve is tricuspid. Aortic valve regurgitation is trivial. Mild aortic valve sclerosis is present, with no evidence of aortic valve stenosis. Mild aortic valve annular calcification. Pulmonic Valve: The pulmonic valve was grossly normal. Pulmonic valve regurgitation is trivial. Aorta: The aortic root is normal in size and structure. Venous: The inferior vena cava is normal in size with less than 50% respiratory variability, suggesting right atrial pressure of 8 mmHg. IAS/Shunts: No atrial level shunt detected by color flow Doppler.  LEFT VENTRICLE PLAX 2D LVIDd:         4.20 cm     Diastology LVIDs:         2.70 cm     LV e' lateral:   4.95 cm/s LV PW:         1.00 cm     LV E/e' lateral: 16.0 LV IVS:        1.00 cm     LV e' medial:    3.09 cm/s LVOT diam:     1.50 cm     LV E/e' medial:  25.6 LV SV:         40 LV SV Index:   29 LVOT Area:     1.77 cm  LV Volumes (MOD) LV vol d, MOD A2C: 74.2 ml LV vol d, MOD A4C: 99.4 ml LV vol s, MOD A2C: 38.1 ml LV vol s, MOD A4C: 50.3 ml LV SV MOD A2C:     36.1 ml LV SV MOD A4C:     99.4 ml LV SV MOD BP:      43.9 ml RIGHT VENTRICLE RV S prime:     6.58 cm/s TAPSE (M-mode): 1.1 cm LEFT ATRIUM             Index       RIGHT ATRIUM           Index LA diam:        4.70 cm 3.46 cm/m  RA Area:     15.30 cm LA Vol (A2C):   47.7 ml 35.15 ml/m RA Volume:   39.10 ml  28.81 ml/m LA Vol (A4C):   50.5 ml 37.22 ml/m LA Biplane Vol: 49.4 ml 36.41 ml/m  AORTIC VALVE LVOT Vmax:   110.00  cm/s LVOT Vmean:  68.000 cm/s LVOT VTI:    0.224 m  AORTA Ao Root diam: 2.90 cm MITRAL VALVE                TRICUSPID VALVE MV Area (PHT): 2.26 cm    TR Peak grad:   53.9 mmHg MV Decel Time: 335 msec    TR Vmax:        367.00 cm/s MV E velocity: 79.20 cm/s MV A velocity: 93.20 cm/s  SHUNTS MV E/A ratio:  0.85        Systemic VTI:  0.22 m                            Systemic Diam: 1.50 cm Rozann Lesches MD Electronically signed by Rozann Lesches MD Signature Date/Time: 08/01/2019/5:36:32 PM    Final    DG FEMUR, MIN 2 VIEWS RIGHT  Result Date: 08/04/2019 CLINICAL DATA:  Fall, right-sided weakness EXAM: RIGHT FEMUR 2 VIEWS COMPARISON:  None. FINDINGS: No fracture or malalignment. Vascular calcifications. Edema within the soft tissues. IMPRESSION: No acute osseous abnormality. Electronically Signed   By: Donavan Foil M.D.   On: 08/04/2019 19:34   VAS [place] LOWER EXTREMITY VENOUS (DVT)  Result Date: 08/05/2019  Lower Venous DVTStudy Indications: Swelling.  Comparison Study: no prior Performing Technologist: Abram Sander RVS  Examination Guidelines: A complete evaluation includes B-mode imaging, spectral Doppler, color Doppler, and power Doppler as needed of all accessible portions of each vessel. Bilateral testing is considered an integral part of a complete examination. Limited examinations for reoccurring indications may be performed as noted. The reflux portion of the exam is performed with the patient in reverse Trendelenburg.  +---------+---------------+---------+-----------+----------+-----------------+ RIGHT    CompressibilityPhasicitySpontaneityPropertiesThrombus Aging    +---------+---------------+---------+-----------+----------+-----------------+ CFV      Partial        Yes      Yes                  Age Indeterminate +---------+---------------+---------+-----------+----------+-----------------+ SFJ      None                                         Age Indeterminate +---------+---------------+---------+-----------+----------+-----------------+ FV Prox  None                                          Age Indeterminate +---------+---------------+---------+-----------+----------+-----------------+ FV Mid   None                                         Age Indeterminate +---------+---------------+---------+-----------+----------+-----------------+ FV DistalNone                                         Age Indeterminate +---------+---------------+---------+-----------+----------+-----------------+ PFV      None                                         Age Indeterminate +---------+---------------+---------+-----------+----------+-----------------+ POP      None  No       No                   Age Indeterminate +---------+---------------+---------+-----------+----------+-----------------+ PTV      None                                         Age Indeterminate +---------+---------------+---------+-----------+----------+-----------------+ PERO     None                                         Age Indeterminate +---------+---------------+---------+-----------+----------+-----------------+ GSV      None                                                           +---------+---------------+---------+-----------+----------+-----------------+ EIV                     Yes      Yes                                    +---------+---------------+---------+-----------+----------+-----------------+   +----+---------------+---------+-----------+----------+--------------+ LEFTCompressibilityPhasicitySpontaneityPropertiesThrombus Aging +----+---------------+---------+-----------+----------+--------------+ CFV Full           Yes      Yes                                 +----+---------------+---------+-----------+----------+--------------+     Summary: RIGHT: - Findings consistent with age indeterminate deep vein thrombosis involving the right common femoral vein, SF junction, right femoral vein, right proximal profunda vein, right popliteal vein, and  right posterior tibial veins. - Findings consistent with age indeterminate superficial vein thrombosis involving the right great saphenous vein.   *See table(s) above for measurements and observations. Electronically signed by Harold Barban MD on 08/05/2019 at 5:18:17 PM.    Final    VAS [place] UPPER EXTREMITY VENOUS DUPLEX  Result Date: 08/05/2019 UPPER VENOUS STUDY  Indications: Swelling Comparison Study: no prior Performing Technologist: Abram Sander RVS  Examination Guidelines: A complete evaluation includes B-mode imaging, spectral Doppler, color Doppler, and power Doppler as needed of all accessible portions of each vessel. Bilateral testing is considered an integral part of a complete examination. Limited examinations for reoccurring indications may be performed as noted.  Right Findings: +----------+------------+---------+-----------+----------+-------+ RIGHT     CompressiblePhasicitySpontaneousPropertiesSummary +----------+------------+---------+-----------+----------+-------+ IJV           Full       Yes       Yes                      +----------+------------+---------+-----------+----------+-------+ Subclavian    Full       Yes       Yes                      +----------+------------+---------+-----------+----------+-------+ Axillary      Full       Yes       Yes                      +----------+------------+---------+-----------+----------+-------+  Brachial      Full       Yes       Yes                      +----------+------------+---------+-----------+----------+-------+ Radial        Full                                          +----------+------------+---------+-----------+----------+-------+ Ulnar         Full                                          +----------+------------+---------+-----------+----------+-------+ Cephalic      Full                                          +----------+------------+---------+-----------+----------+-------+  Basilic       Full                                          +----------+------------+---------+-----------+----------+-------+  Summary:  Right: No evidence of deep vein thrombosis in the upper extremity. No evidence of superficial vein thrombosis in the upper extremity.  *See table(s) above for measurements and observations.  Diagnosing physician: Harold Barban MD Electronically signed by Harold Barban MD on 08/05/2019 at 5:17:35 PM.    Final     Time Spent in minutes  30   Lala Lund M.D on 08/06/2019 at 10:11 AM  To page go to [email or web address] - password [facility]

## 2019-08-06 NOTE — Progress Notes (Signed)
OT Cancellation Note  Patient Details Name: ANALYNN DAUM MRN: [id number] DOB: 07-14-1950   Cancelled Treatment:    Reason Eval/Treat Not Completed: Patient at procedure or test/ unavailable (Pt OTF for HD. Will follow up as able)  Zenovia Jarred, MSOT, OTR/L [facility] Office Number: [phone] Pager: [phone]  Zenovia Jarred 08/06/2019, 12:59 PM

## 2019-08-06 NOTE — Progress Notes (Signed)
[facility] Progress Note  Subjective: seen in room, drowsy but responsive, +cough  Vitals:   08/06/19 0550 08/06/19 0551 08/06/19 0802 08/06/19 0805  BP:   (!) 154/72   Pulse:   88   Resp:  16 19   Temp:   98.4 F (36.9 C)   TempSrc:   Oral   SpO2: 95% 100%  100%  Weight:      Height:        Exam: General: lethargic and wet cough Heart:RRR, s1s2 nl, no rubs Lungs: Basal crackles, no wheezing Abdomen:soft, Non-tender, non-distended Extremities:No LE edema Dialysis Access: AV fistula has good thrill and bruit    OP HD: [facility] MWF  4h  40kg  2/2 bath  Hep none   LUE AVF  mircera 200 ug last 6/30  venofer 100 tiw  calc 0.75 tiw  Summary: Pt is a 69 y.o. yo female  with h/o HTN, CAD s/p stent, A. fib, anxiety depression, anemia, SBO, mesenteric ischemia s/p SMA stenting, multiple recent admissions,[facility] after cardiac arrest seen as a consultation for the management of ESRD.  Assessment/ Plan: 1. Cardiac arrest/V. tach: Probably contributed by hyperkalemia 2. Acute/ subacute LE DVT: going to IR for IVC filter today.  3. ESRD: MWF HD. AV fistula for the access.  HD this am, max UF as below 4. HTN/ volume overload: BP's high and diffuse vol overload/ edema on exam, CXR worsening edema as well today. 7-8kg over dry.  Needs aggressive UF on HD.  5. Anemia of ESRD: Monitor hemoglobin. Recently got Mircera and iron.  Check iron studies.  Transfuse as needed. 6. Metabolic Bone Disease: Resume binders when able to take orally.  Apparently phosphorus was low yesterday.  Monitor lab. 7. CT head showed subdural hematoma.  Avoid heparin. 8. Disposition: Poor adherence with outpatient dialysis.  She is DNR.   Rob Dina Warbington 08/06/2019, 10:21 AM   Recent Labs  Lab 08/03/19 0346 08/03/19 1621 08/03/19 2033 08/05/19 0500 08/06/19 0540  K 3.3*  --    < > 4.9 4.7  BUN 43*  --    < > 39* 52*   CREATININE 4.41*  --    < > 4.16* 5.52*  CALCIUM 7.1*  --    < > 8.6* 7.6*  PHOS 6.3* 2.1*  --   --   --   HGB 7.2*  --    < > 7.7* 7.3*   < > = values in this interval not displayed.   Inpatient medications: . sodium chloride   Intravenous Once  . amiodarone  200 mg Oral BID  . amLODipine  10 mg Oral Daily  . arformoterol  15 mcg Nebulization BID  . aspirin  81 mg Oral Daily  . atorvastatin  40 mg Oral Daily  . budesonide (PULMICORT) nebulizer solution  0.5 mg Nebulization BID  . carvedilol  6.25 mg Oral BID WC  . chlorhexidine gluconate (MEDLINE KIT)  15 mL Mouth Rinse BID  . Chlorhexidine Gluconate Cloth  6 each Topical Q0600  . Chlorhexidine Gluconate Cloth  6 each Topical Q0600  . feeding supplement (NEPRO CARB STEADY)  237 mL Oral TID BM  . gabapentin  300 mg Oral BID  . insulin aspart  0-6 Units Subcutaneous Q4H  . mouth rinse  15 mL Mouth Rinse BID  . multivitamin  1 tablet Oral QHS  . neomycin-bacitracin-polymyxin   Topical BID  . pantoprazole  40 mg  Oral BID  . sodium chloride flush  10-40 mL Intracatheter Q12H   . sodium chloride    . sodium chloride     sodium chloride, sodium chloride, acetaminophen, albuterol, alteplase, docusate sodium, guaiFENesin, hydrALAZINE, lidocaine (PF), lidocaine-prilocaine, midodrine, ondansetron (ZOFRAN) IV, pentafluoroprop-tetrafluoroeth, polyethylene glycol, sodium chloride flush, traMADol

## 2019-08-06 NOTE — Plan of Care (Signed)
Discussed with patient plan of care for the evening, pain management and helped her eat her bedtime snack with some teach back displayed

## 2019-08-07 LAB — BASIC METABOLIC PANEL
Anion gap: 11 (ref 5–15)
BUN: 37 mg/dL — ABNORMAL HIGH (ref 8–23)
CO2: 27 mmol/L (ref 22–32)
Calcium: 7.4 mg/dL — ABNORMAL LOW (ref 8.9–10.3)
Chloride: 98 mmol/L (ref 98–111)
Creatinine, Ser: 4.01 mg/dL — ABNORMAL HIGH (ref 0.44–1.00)
GFR calc Af Amer: 12 mL/min — ABNORMAL LOW (ref >60)
GFR calc non Af Amer: 11 mL/min — ABNORMAL LOW (ref >60)
Glucose, Bld: 138 mg/dL — ABNORMAL HIGH (ref 70–99)
Potassium: 4.4 mmol/L (ref 3.5–5.1)
Sodium: 136 mmol/L (ref 135–145)

## 2019-08-07 LAB — TYPE AND SCREEN
ABO/RH(D): A POS
Antibody Screen: NEGATIVE
Unit division: 0

## 2019-08-07 LAB — GLUCOSE, CAPILLARY
Glucose-Capillary: 120 mg/dL — ABNORMAL HIGH (ref 70–99)
Glucose-Capillary: 129 mg/dL — ABNORMAL HIGH (ref 70–99)
Glucose-Capillary: 141 mg/dL — ABNORMAL HIGH (ref 70–99)
Glucose-Capillary: 178 mg/dL — ABNORMAL HIGH (ref 70–99)
Glucose-Capillary: 84 mg/dL (ref 70–99)
Glucose-Capillary: 92 mg/dL (ref 70–99)

## 2019-08-07 LAB — CBC WITH DIFFERENTIAL/PLATELET
Abs Immature Granulocytes: 0.06 10*3/uL (ref 0.00–0.07)
Basophils Absolute: 0 10*3/uL (ref 0.0–0.1)
Basophils Relative: 0 %
Eosinophils Absolute: 0.4 10*3/uL (ref 0.0–0.5)
Eosinophils Relative: 5 %
HCT: 28.7 % — ABNORMAL LOW (ref 36.0–46.0)
Hemoglobin: 8.8 g/dL — ABNORMAL LOW (ref 12.0–15.0)
Immature Granulocytes: 1 %
Lymphocytes Relative: 8 %
Lymphs Abs: 0.6 10*3/uL — ABNORMAL LOW (ref 0.7–4.0)
MCH: 28.5 pg (ref 26.0–34.0)
MCHC: 30.7 g/dL (ref 30.0–36.0)
MCV: 92.9 fL (ref 80.0–100.0)
Monocytes Absolute: 0.7 10*3/uL (ref 0.1–1.0)
Monocytes Relative: 8 %
Neutro Abs: 6.2 10*3/uL (ref 1.7–7.7)
Neutrophils Relative %: 78 %
Platelets: 94 10*3/uL — ABNORMAL LOW (ref 150–400)
RBC: 3.09 MIL/uL — ABNORMAL LOW (ref 3.87–5.11)
RDW: 20.7 % — ABNORMAL HIGH (ref 11.5–15.5)
WBC: 7.9 10*3/uL (ref 4.0–10.5)
nRBC: 0 % (ref 0.0–0.2)

## 2019-08-07 LAB — BRAIN NATRIURETIC PEPTIDE: B Natriuretic Peptide: 3410.4 pg/mL — ABNORMAL HIGH (ref 0.0–100.0)

## 2019-08-07 LAB — BPAM RBC
Blood Product Expiration Date: 202107282359
ISSUE DATE / TIME: 202107101256
Unit Type and Rh: 6200

## 2019-08-07 LAB — MAGNESIUM: Magnesium: 1.6 mg/dL — ABNORMAL LOW (ref 1.7–2.4)

## 2019-08-07 LAB — PROCALCITONIN: Procalcitonin: 1.35 ng/mL

## 2019-08-07 LAB — C-REACTIVE PROTEIN: CRP: 6 mg/dL — ABNORMAL HIGH (ref <1.0)

## 2019-08-07 MED ORDER — SODIUM CHLORIDE 0.9 % IV SOLN
2.0000 g | INTRAVENOUS | Status: AC
  Administered 2019-08-09: 2 g via INTRAVENOUS
  Filled 2019-08-07: qty 2

## 2019-08-07 MED ORDER — CHLORHEXIDINE GLUCONATE CLOTH 2 % EX PADS
6.0000 | MEDICATED_PAD | Freq: Every day | CUTANEOUS | Status: DC
  Administered 2019-08-11 – 2019-08-12 (×2): 6 via TOPICAL

## 2019-08-07 MED ORDER — HYDROCODONE-ACETAMINOPHEN 5-325 MG PO TABS
1.0000 | ORAL_TABLET | Freq: Four times a day (QID) | ORAL | Status: DC | PRN
  Administered 2019-08-08 – 2019-08-11 (×7): 1 via ORAL
  Filled 2019-08-07 (×7): qty 1

## 2019-08-07 MED ORDER — MAGNESIUM SULFATE 2 GM/50ML IV SOLN
2.0000 g | Freq: Once | INTRAVENOUS | Status: AC
  Administered 2019-08-07: 2 g via INTRAVENOUS
  Filled 2019-08-07: qty 50

## 2019-08-07 NOTE — Progress Notes (Addendum)
PROGRESS NOTE                                                                                                                                                                                                             Patient Demographics:    Christina [name], is a 69 y.o. female, DOB - Apr 22, 1950, SVX:[id number]  Admit date - 08/01/2019   Admitting Physician Garner Nash, DO  Outpatient Primary MD for the patient is Nodal, [name], PA-C  LOS - 6  CC - Passed out     Brief Narrative - 68 year old Caucasian female with history of ESRD with noncompliance with dialysis on multiple occasions, CAD s/p 2 stents in the past, SMA stent in May 2021, left carotid endarterectomy in the past, hypertension, dyslipidemia, alcohol abuse, GI bleed, COPD, GERD, history of duodenal ulcers found on EGD in June 2021, anemia of chronic disease, medication noncompliance who was brought to [facility] ER after she had an asystolic arrest at home with 10-minute downtime requiring CPR thought to be due to hyperkalemia.  She was subsequently intubated and transferred to [facility] under pulmonary critical care, while under the care of pulmonary critical care she developed right-sided weakness arm more than leg on 08/04/2019.  Imaging studies of the brain showed stable small subdural hematoma, she was stabilized extubated and transferred to my service on 08/05/2019 on day 4 of her hospital stay.   Subjective:   Patient in bed, appears comfortable, denies any headache, no fever, no chest pain or pressure, no shortness of breath , no abdominal pain. No focal weakness.  Have ongoing right leg pain at the site of her skin tear.   Assessment  & Plan :     1.  Cardiac arrest with subsequent respiratory failure due to unresponsive state caused by hyperkalemia due to noncompliance with dialysis.  She has been dialyzed, potassium stable, has been extensively counseled on compliance, also discussed this with  her [name] over the phone on 08/05/2019.  Echo shows stable EF of around 55% with no regional wall motion abnormality.  Continue monitoring with supportive care.  2.  CAD s/p stent in the remote past, history of atrial fibrillation, PAD with history of SMA stent, left carotid endarterectomy.  Will place her on statin and Coreg for now for secondary prevention, currently on baby aspirin only Plavix held due to recent subdural hematoma, see #5 below.  3.  ESRD.  TTS schedule.  Nephrology on board.  Counseled on  compliance.  4.  Acute metabolic encephalopathy post cardiac arrest.  Worse this morning after an episode of hypoglycemia last night, supportive care and monitor.  5.  Recent subdural hematoma on CT scan, new right arm weakness x 2 days.  Neurology consulted, MRI brain along with CT angiogram head and neck ordered.  Statin for secondary prevention, baby aspirin only, discussed the case with neurology Dr. Erlinda Hong on 08/06/2019, per neurology repeat head CT on 08/11/2019.  If stable subdural bleed then resume Plavix.  6.  Essential hypertension.  Placed on Coreg.  Will monitor and adjust.  Once ischemic infarct is ruled out will be more aggressive with blood pressure control.  7.  Anemia and chronic thrombocytopenia.  Also has history of duodenal GI bleed in the past, likely has alcoholic cirrhosis as well.  Will transfuse 1 unit of packed RBC on 08/06/2019 due to symptomatic anemia.  Continue to monitor closely.  Continue PPI.  8.  Severe PCM.  Supportive care with nutritional supplements.  9.  History of paroxysmal A. fib.  On beta-blocker.  Severely noncompliant with medications.  Mali vas 2 score of greater than 4.  Not on anticoagulation prior to this admission likely due to severe noncompliance along with history of recent upper GI bleed.  Currently has new SDH as well.  10.  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  Cannot fully anticoagulate due to recent  subdural hematoma and recent GI bleed.  Also very noncompliant with medications.  Received IVC filter by IR on 08/05/2019.  11.  Stress related hyperglycemia.  No DM type II.  Extremely brittle sugar control, A1c is 4.9, multiple drops in blood sugar hands have stopped sliding scale for now.  12.  Possible aspiration pneumonia on 08/06/2019 due to encephalopathy.  Empirically placed on Maxipime for now x 4 days total and monitor.  Will also request RT for deep NT suction.  Soft diet with feeding assistance and aspiration precautions.  Keep head of the bed at 35 degrees and monitor.   Overall prognosis is extremely poor due to multiple above-mentioned comorbidities and longstanding history of noncompliance.  Daughter was clearly explained on 08/05/2019 over the phone she understands this.  If any further decline we will transition towards comfort.  For now DNR.  Recent Labs  Lab 08/01/19 1208 08/01/19 1400 08/01/19 1504 08/02/19 0015 08/03/19 0346 08/04/19 0331 08/05/19 0500 08/06/19 0540 08/06/19 1010 08/07/19 0333  WBC 9.1   < >  --    < > 16.6* 14.9* 17.1* 10.3  --  7.9  PLT 125*   < >  --    < > 101* 120* 129* 133*  --  94*  CRP  --   --   --   --   --   --   --   --   --  6.0*  PROCALCITON 0.68  --   --   --   --   --   --   --  1.45 1.35  LATICACIDVEN 9.8*  --  1.9  --   --   --   --   --   --   --    < > = values in this interval not displayed.             Condition - Extremely Guarded  Family Communication  :  [name] [phone] on 08/05/2019  Code Status :  DNR  Consults  :  PCCM, Renal, Neuro  Procedures  :    CT Head 7/5 >> trace acute subdural hematoma along the falx, no other acute intracranial findings.  No CT evidence of anoxic injury  ECHO 7/5 >> LVEF 55-60%, no RWMA, interventricular septum is mildly flattened in systole & diastole consistent with RV & volume overload, RV systolic function is mildly reduced.  Severely elevated pulmonary artery  systolic pressure, estimated RV systolic pressure is 46.6.  LA mildly dilated, mild to moderate TR  CT Head 7/6 & 7/7 >> stable trace parafalcine subdural hematoma, advanced chronic ischemic injury  ETT 7/5 >> 7/7   R Fem TLC 7/5 >> 7/5  L IJ CL 7/5 >>  Venous ultrasound.  Multiple right leg DVTs.  Likely subacute.    MRI brain - Numerous small acute infarcts involving multiple vascular territories bilaterally suggesting a central embolic etiology.  Advanced chronic microvascular ischemic changes and multiple chronic infarcts.   CT angiogram head and neck. No substantial change since 06/19/2019. There is irregular noncalcified plaque along the aortic arch. Stable findings of stenosis including left common carotid artery, intracranial right vertebral artery, intracranial ICAs, and left A2 ACA. Persistent pleural effusions and associated atelectasis.  IVC filter placement on 08/05/2019.   PUD Prophylaxis : PPI  Disposition Plan  :    Status is: Inpatient  Remains inpatient appropriate because:Unsafe d/c plan, work-up for CVA.   Dispo: The patient is from: Home              Anticipated d/c is to: SNF              Anticipated d/c date is: > 3 days              Patient currently is not medically stable to d/c.  DVT Prophylaxis  :    SCDs    Lab Results  Component Value Date   PLT 94 (L) 08/07/2019    Diet :  Diet Order            Diet renal with fluid restriction Fluid restriction: 1200 mL Fluid; Room service appropriate? Yes; Fluid consistency: Thin  Diet effective now                  Inpatient Medications Scheduled Meds: . sodium chloride   Intravenous Once  . amiodarone  200 mg Oral BID  . amLODipine  10 mg Oral Daily  . arformoterol  15 mcg Nebulization BID  . aspirin  81 mg Oral Daily  . atorvastatin  40 mg Oral Daily  . budesonide (PULMICORT) nebulizer solution  0.5 mg Nebulization BID  . carvedilol  6.25 mg Oral BID WC  . chlorhexidine gluconate (MEDLINE  KIT)  15 mL Mouth Rinse BID  . Chlorhexidine Gluconate Cloth  6 each Topical Q0600  . Chlorhexidine Gluconate Cloth  6 each Topical Q0600  . feeding supplement (NEPRO CARB STEADY)  237 mL Oral TID BM  . gabapentin  300 mg Oral BID  . insulin aspart  0-6 Units Subcutaneous Q4H  . mouth rinse  15 mL Mouth Rinse BID  . multivitamin  1 tablet Oral QHS  . neomycin-bacitracin-polymyxin   Topical BID  . pantoprazole  40 mg Oral BID  . sodium chloride flush  10-40 mL Intracatheter Q12H   Continuous Infusions: . ceFEPime (MAXIPIME) IV Stopped (08/06/19 2030)  . metronidazole 500 mg (08/07/19 1019)   PRN Meds:.acetaminophen, albuterol, docusate sodium, guaiFENesin, hydrALAZINE, HYDROcodone-acetaminophen, midodrine, ondansetron (ZOFRAN) IV, polyethylene glycol, traMADol  Antibiotics  :  Anti-infectives (From admission, onward)   Start     Dose/Rate Route Frequency Ordered Stop   08/06/19 1800  ceFEPIme (MAXIPIME) 2 g in sodium chloride 0.9 % 100 mL IVPB     Discontinue     2 g 200 mL/hr over 30 Minutes Intravenous Every T-Th-Sa (Hemodialysis) 08/06/19 1034     08/06/19 1200  metroNIDAZOLE (FLAGYL) IVPB 500 mg     Discontinue     500 mg 100 mL/hr over 60 Minutes Intravenous Every 8 hours 08/06/19 1034            Objective:   Vitals:   08/07/19 0601 08/07/19 0757 08/07/19 0830 08/07/19 0840  BP:   139/85   Pulse:  69 69 67  Resp: 16 (!) 22  18  Temp:   (!) 97 F (36.1 C)   TempSrc:   Oral   SpO2:  100% 98% 97%  Weight:      Height:        SpO2: 97 % O2 Flow Rate (L/min): 2 L/min FiO2 (%): 30 %  Wt Readings from Last 3 Encounters:  08/07/19 47.1 kg  07/14/19 40.5 kg  06/20/19 50.8 kg     Intake/Output Summary (Last 24 hours) at 08/07/2019 1044 Last data filed at 08/07/2019 4098 Gross per 24 hour  Intake 1242.04 ml  Output 3424 ml  Net -2181.96 ml     Physical Exam  More somnolent this morning with audible gurgling, strength 1/5 in right upper  arm, [place].AT,PERRAL Supple Neck,No JVD, No cervical lymphadenopathy appriciated.  Symmetrical Chest wall movement, Good air movement bilaterally, CTAB RRR,No Gallops, Rubs or new Murmurs, No Parasternal Heave +ve B.Sounds, Abd Soft, No tenderness, No organomegaly appriciated, No rebound - guarding or rigidity. Cyanosis in both feet with multiple toes cyanosed and has a chronic multiple dry black toe tips, large bruise anterior part of the right middle leg with bndage covering a skin tear ( came with it)    Data Review:    Recent Labs  Lab 08/01/19 1208 08/01/19 1211 08/03/19 0346 08/04/19 0331 08/05/19 0500 08/06/19 0540 08/07/19 0333  WBC 9.1   < > 16.6* 14.9* 17.1* 10.3 7.9  HGB 8.5*   < > 7.2* 8.0* 7.7* 7.3* 8.8*  HCT 28.5*   < > 24.3* 27.0* 25.7* 25.1* 28.7*  PLT 125*   < > 101* 120* 129* 133* 94*  MCV 96.3   < > 98.4 100.0 98.5 98.8 92.9  MCH 28.7   < > 29.1 29.6 29.5 28.7 28.5  MCHC 29.8*   < > 29.6* 29.6* 30.0 29.1* 30.7  RDW 17.2*   < > 17.6* 18.3* 19.0* 19.2* 20.7*  LYMPHSABS 0.2*  --   --   --   --   --  0.6*  MONOABS 0.1  --   --   --   --   --  0.7  EOSABS 0.0  --   --   --   --   --  0.4  BASOSABS 0.0  --   --   --   --   --  0.0   < > = values in this interval not displayed.    Recent Labs  Lab 08/01/19 1208 08/01/19 1211 08/01/19 1400 08/01/19 1400 08/02/19 0015 08/02/19 0015 08/02/19 0225 08/02/19 1845 08/03/19 0346 08/03/19 0346 08/03/19 1621 08/03/19 2033 08/04/19 0331 08/05/19 0500 08/06/19 0540 08/06/19 1010 08/07/19 0333  NA 149*   < > 138   < > 137   < >  --   --  141  --   --   --  139 137 132*  --  136  K 3.9   < > 3.5   < > 4.3   < >  --   --  3.3*   < >  --  3.7 3.8 4.9 4.7  --  4.4  CL 91*   < > 99   < > 95*   < >  --   --  94*  --   --   --  99 96* 93*  --  98  CO2 34*   < > 30   < > 32   < >  --   --  34*  --   --   --  _0 --  27  GLUCOSE 297*   < > 64*   < > 316*   < >   < >  --  166*  --   --   --  182* 124* 289*  --  138*   BUN 50*   < > 11   < > 22   < >  --   --  43*  --   --   --  21 39* 52*  --  37*  CREATININE 6.34*   < > 1.72*   < > 3.25*   < >  --   --  4.41*  --   --   --  2.87* 4.16* 5.52*  --  4.01*  CALCIUM 8.5*   < > 6.9*   < > 7.0*   < >  --   --  7.1*  --   --   --  8.0* 8.6* 7.6*  --  7.4*  AST 1,044*  --   --   --   --   --   --   --  305*  --   --   --   --  62* 35  --   --   ALT 561*  --   --   --   --   --   --   --  446*  --   --   --   --  222* 145*  --   --   ALKPHOS 79  --   --   --   --   --   --   --  73  --   --   --   --  86 75  --   --   BILITOT 1.6*  --   --   --   --   --   --   --  0.8  --   --   --   --  1.0 1.1  --   --   ALBUMIN 2.0*  --  1.9*  --   --   --   --   --  1.7*  --   --   --   --  2.1* 2.0*  --   --   MG 3.3*  --   --    < > 1.9  --   --  1.8 1.8  --  1.7  --   --   --   --   --  1.6*  CRP  --   --   --   --   --   --   --   --   --   --   --   --   --   --   --   --  6.0*  PROCALCITON 0.68  --   --   --   --   --   --   --   --   --   --   --   --   --   --  1.45 1.35  INR 1.8*  --   --   --   --   --   --   --   --   --   --   --   --   --   --   --   --   BNP >4,500.0*  --   --   --   --   --   --   --   --   --   --   --   --   --   --   --  3,410.4*   < > = values in this interval not displayed.    Recent Labs  Lab 08/01/19 1208 08/06/19 1010 08/07/19 0333  CRP  --   --  6.0*  BNP >4,500.0*  --  3,410.4*  PROCALCITON 0.68 1.45 1.35  SARSCOV2NAA NEGATIVE  --   --     ------------------------------------------------------------------------------------------------------------------ No results for input(s): CHOL, HDL, LDLCALC, TRIG, CHOLHDL, LDLDIRECT in the last 72 hours.  Lab Results  Component Value Date   HGBA1C 4.9 06/19/2019   ------------------------------------------------------------------------------------------------------------------ No results for input(s): TSH, T4TOTAL, T3FREE, THYROIDAB in the last 72 hours.  Invalid input(s):  FREET3 ------------------------------------------------------------------------------------------------------------------ No results for input(s): VITAMINB12, FOLATE, FERRITIN, TIBC, IRON, RETICCTPCT in the last 72 hours.  Coagulation profile Recent Labs  Lab 08/01/19 1208  INR 1.8*    No results for input(s): DDIMER in the last 72 hours.  Cardiac Enzymes No results for input(s): CKMB, TROPONINI, MYOGLOBIN in the last 168 hours.  Invalid input(s): CK ------------------------------------------------------------------------------------------------------------------    Component Value Date/Time   BNP 3,410.4 (H) 08/07/2019 3545    Micro Results Recent Results (from the past 240 hour(s))  SARS Coronavirus 2 by RT PCR (hospital order, performed in [facility] hospital lab) Nasopharyngeal Nasopharyngeal Swab     Status: None   Collection Time: 08/01/19 12:08 PM   Specimen: Nasopharyngeal Swab  Result Value Ref Range Status   SARS Coronavirus 2 NEGATIVE NEGATIVE Final    Comment: (NOTE) SARS-CoV-2 target nucleic acids are NOT DETECTED.  The SARS-CoV-2 RNA is generally detectable in upper and lower respiratory specimens during the acute phase of infection. The lowest concentration of SARS-CoV-2 viral copies this assay can detect is 250 copies / mL. A negative result does not preclude SARS-CoV-2 infection and should not be used as the sole basis for treatment or other patient management decisions.  A negative result may occur with improper specimen collection / handling, submission of specimen other than nasopharyngeal swab, presence of viral mutation(s) within the areas targeted by this assay, and inadequate number of viral copies (<250 copies / mL). A negative result must be combined with clinical observations, patient history, and epidemiological information.  Fact Sheet for Patients:   StrictlyIdeas.no  Fact Sheet for Healthcare  Providers: BankingDealers.co.za  This test is not yet approved or  cleared by the [place] FDA and has been authorized for detection and/or diagnosis of SARS-CoV-2 by FDA under an Emergency Use Authorization (EUA).  This EUA will remain in effect (meaning this test can be used) for the duration of the COVID-19 declaration under Section 564(b)(1) of the Act, 21 U.S.C. section 360bbb-3(b)(1), unless the authorization is terminated or  revoked sooner.  Performed at [facility], [place] [address], [place], [place] [address]   Culture, blood (Routine X 2) w Reflex to ID Panel     Status: None   Collection Time: 08/01/19 12:44 PM   Specimen: BLOOD RIGHT ARM  Result Value Ref Range Status   Specimen Description BLOOD RIGHT ARM  Final   Special Requests   Final    BOTTLES DRAWN AEROBIC ONLY Blood Culture results may not be optimal due to an inadequate volume of blood received in culture bottles   Culture   Final    NO GROWTH 5 DAYS Performed at [facility], [place] [address], [place], [place] [address]    Report Status 08/06/2019 FINAL  Final  MRSA PCR Screening     Status: None   Collection Time: 08/01/19 12:45 PM  Result Value Ref Range Status   MRSA by PCR NEGATIVE NEGATIVE Final    Comment:        The GeneXpert MRSA Assay (FDA approved for NASAL specimens only), is one component of a comprehensive MRSA colonization surveillance program. It is not intended to diagnose MRSA infection nor to guide or monitor treatment for MRSA infections. Performed at [facility], [place] [address] [place], [place] [address]   Culture, blood (Routine X 2) w Reflex to ID Panel     Status: None   Collection Time: 08/01/19 12:48 PM   Specimen: BLOOD RIGHT WRIST  Result Value Ref Range Status   Specimen Description BLOOD RIGHT WRIST  Final   Special Requests   Final    BOTTLES DRAWN AEROBIC ONLY Blood Culture adequate volume   Culture   Final    NO GROWTH 5  DAYS Performed at [facility], [address], [place], [place] [address]    Report Status 08/06/2019 FINAL  Final  Culture, respiratory (non-expectorated)     Status: None   Collection Time: 08/01/19  3:59 PM   Specimen: Tracheal Aspirate; Respiratory  Result Value Ref Range Status   Specimen Description TRACHEAL ASPIRATE  Final   Special Requests Normal  Final   Gram Stain   Final    FEW WBC PRESENT, PREDOMINANTLY PMN ABUNDANT GRAM POSITIVE COCCI RARE GRAM NEGATIVE RODS RARE GRAM POSITIVE RODS    Culture   Final    FEW Consistent with normal respiratory flora. Performed at [facility], [place] [address] [place], [place] [address]    Report Status 08/04/2019 FINAL  Final    Radiology Reports CT ANGIO HEAD W OR WO CONTRAST  Result Date: 08/05/2019 CLINICAL DATA:  Right-sided weakness EXAM: CT ANGIOGRAPHY HEAD AND NECK TECHNIQUE: Multidetector CT imaging of the head and neck was performed using the standard protocol during bolus administration of intravenous contrast. Multiplanar CT image reconstructions and MIPs were obtained to evaluate the vascular anatomy. Carotid stenosis measurements (when applicable) are obtained utilizing NASCET criteria, using the distal internal carotid diameter as the denominator. CONTRAST:  49m OMNIPAQUE IOHEXOL 350 MG/ML SOLN COMPARISON:  06/19/2019 FINDINGS: CTA NECK Aortic arch: Calcified and irregular noncalcified plaque again identified along the arch. Great vessel origins are patent. Right carotid system: Patent with multifocal calcified and noncalcified plaque. Stable less than 50% stenosis at the ICA origin. Left carotid system: Patent with multifocal calcified and noncalcified plaque. As before, there is approximately 60% stenosis of the common carotid at the level of the thyroid. No measurable stenosis at the ICA origin with evidence of prior endarterectomy.  Vertebral arteries: Patent with calcified plaque again noted at the origins.  Skeleton: Advanced multilevel degenerative changes. Other neck: No acute abnormality. Upper chest: Left greater than right pleural effusions with associated atelectasis. Review of the MIP images confirms the above findings CTA HEAD Anterior circulation: Intracranial internal carotid arteries are patent with calcified plaque causing up to moderate stenosis. Middle and anterior cerebral arteries are patent. Unchanged moderate stenosis of the distal left A2 ACA. Posterior circulation: Intracranial vertebral arteries are patent with calcified and noncalcified plaque. There is unchanged moderate stenosis of the right vertebral artery beyond the PICA origin. Basilar artery is patent with mild atherosclerotic irregularity. Posterior cerebral arteries are patent proximally with atherosclerotic irregularity. There is unchanged diminished opacification of the superior division of the distal left PCA. Venous sinuses: Patent as allowed by contrast bolus timing. Review of the MIP images confirms the above findings IMPRESSION: No substantial change since 06/19/2019. There is irregular noncalcified plaque along the aortic arch. Stable findings of stenosis including left common carotid artery, intracranial right vertebral artery, intracranial ICAs, and left A2 ACA. Persistent pleural effusions and associated atelectasis. Electronically Signed   By: Macy Mis M.D.   On: 08/05/2019 13:50   DG Chest 1 View  Result Date: 08/02/2019 CLINICAL DATA:  Central line placement. EXAM: CHEST  1 VIEW COMPARISON:  08/01/2019.  CT 05/22/2019. FINDINGS: Interim placement left subclavian line, its tip is at the cavoatrial junction. Endotracheal tube and NG tube in stable position. Cardiomegaly. No pulmonary venous congestion. Mild bilateral interstitial prominence again noted. A component of these changes may be chronic however interstitial edema and/or pneumonitis can not be excluded. Tiny left pleural effusion. No pneumothorax. Surgical  clips noted over the neck. IMPRESSION: 1. Interim placement of left subclavian line, its tip is at the cavoatrial junction. Endotracheal tube and NG tube in stable position. 2. Cardiomegaly. No pulmonary venous congestion. Mild bilateral interstitial prominence again noted. A component of these changes may be chronic however interstitial edema and/or pneumonitis cannot be excluded. Tiny left pleural effusion. Electronically Signed   By: Marcello Moores  Register   On: 08/02/2019 06:10   DG Forearm Right  Result Date: 08/04/2019 CLINICAL DATA:  Fall, right-sided weakness EXAM: RIGHT FOREARM - 2 VIEW COMPARISON:  None. FINDINGS: No fracture or malalignment. Fat pad distention at the elbow consistent with an effusion. Prominent posterior elbow soft tissue swelling. Probable cartilaginous calcification at the wrist. IMPRESSION: No definitive fracture is seen, however there is an elbow effusion and occult fracture cannot be excluded. Electronically Signed   By: Donavan Foil M.D.   On: 08/04/2019 19:33   DG Tibia/Fibula Right  Result Date: 08/04/2019 CLINICAL DATA:  [place] during PT profound right-sided weakness EXAM: RIGHT TIBIA AND FIBULA - 2 VIEW COMPARISON:  None. FINDINGS: No acute displaced fracture or malalignment. Vascular calcifications. Soft tissues are unremarkable. IMPRESSION: No acute osseous abnormality. Electronically Signed   By: Donavan Foil M.D.   On: 08/04/2019 19:30   CT HEAD WO CONTRAST  Addendum Date: 08/05/2019   ADDENDUM REPORT: 08/05/2019 11:34 ADDENDUM: Stable trace falcine subdural hematoma Electronically Signed   By: Macy Mis M.D.   On: 08/05/2019 11:34   Result Date: 08/05/2019 CLINICAL DATA:  New right-sided weakness EXAM: CT HEAD WITHOUT CONTRAST TECHNIQUE: Contiguous axial images were obtained from the base of the skull through the vertex without intravenous contrast. COMPARISON:  08/02/2019 FINDINGS: Brain: There is no acute intracranial hemorrhage, mass effect, or edema. There is  no definite new loss of gray-white  differentiation. As before, there are multifocal chronic infarcts involving bilateral cerebral hemispheres including deep gray nuclei and bilateral cerebellar hemispheres. Additional patchy and confluent hypoattenuation in the supratentorial white matter likely reflects advanced chronic microvascular ischemic change. Prominence of the ventricles and sulci reflects stable parenchymal volume loss. Vascular: There is atherosclerotic calcification at the skull base. Skull: Calvarium is unremarkable. Sinuses/Orbits: No acute finding. Other: None. IMPRESSION: No acute intracranial hemorrhage or evidence of acute infarction. Small new infarct difficult to exclude due to extensive chronic ischemic changes. Electronically Signed: By: Macy Mis M.D. On: 08/04/2019 19:39   CT HEAD WO CONTRAST  Result Date: 08/02/2019 CLINICAL DATA:  Follow-up subdural hematoma EXAM: CT HEAD WITHOUT CONTRAST TECHNIQUE: Contiguous axial images were obtained from the base of the skull through the vertex without intravenous contrast. COMPARISON:  Yesterday FINDINGS: Brain: Trace high-density thickening left parafalcine at the vertex, not progressed. This is convincing for subdural hemorrhage when compared with a 06/17/2019 head CT. Extensive chronic small vessel ischemic injury with confluent gliosis in the cerebral white matter. Small remote bilateral cerebellar infarcts. Patchy remote cortical infarcts along the cerebral convexities. Chronic lacune at the right caudate head. No hydrocephalus or masslike finding Vascular: No hyperdense vessel or unexpected calcification. Skull: Normal. Negative for fracture or focal lesion. Sinuses/Orbits: No acute finding. IMPRESSION: 1. Stable trace parafalcine subdural hematoma. 2. Advanced chronic ischemic injury. Electronically Signed   By: Monte Fantasia M.D.   On: 08/02/2019 06:14   CT HEAD WO CONTRAST  Addendum Date: 08/01/2019   ADDENDUM REPORT: 08/01/2019  13:43 ADDENDUM: Study discussed by telephone with Pulmonary/Critical Care Dr. Valeta Harms on 08/01/2019 at 1339 hours. Electronically Signed   By: Genevie Ann M.D.   On: 08/01/2019 13:43   Result Date: 08/01/2019 CLINICAL DATA:  69 year old female status post cardiac arrest. Dialysis patient. EXAM: CT HEAD WITHOUT CONTRAST TECHNIQUE: Contiguous axial images were obtained from the base of the skull through the vertex without intravenous contrast. COMPARISON:  CTA head and neck 06/19/2019. Head CT 06/17/2019 and earlier. FINDINGS: Brain: Chronic encephalomalacia in the left frontal lobe, left parietal lobe, right greater than left occipital lobes. Superimposed confluent bilateral white matter hypodensity. Chronic hypodensity scattered in the bilateral deep gray matter nuclei. Small chronic cerebellar infarcts again noted. No evidence of acute cerebral edema. No intracranial mass effect. However, there does appear to be a small or trace volume of subdural hematoma along the falx (coronal image 35). But no other acute intracranial hemorrhage identified. Vascular: Calcified atherosclerosis at the skull base. Skull: No acute osseous abnormality identified. Sinuses/Orbits: Visualized paranasal sinuses and mastoids are stable and well pneumatized. Other: Small volume of fluid in the visible pharynx with oral endotracheal and enteric tubes visible. No acute orbit or scalp soft tissue finding. IMPRESSION: 1. Trace acute subdural hematoma along the falx. But no other acute intracranial hemorrhage and no intracranial mass effect. 2. No CT evidence of anoxic injury. Advanced chronic ischemic disease appears stable from the CTA on 06/19/2019. Electronically Signed: By: Genevie Ann M.D. On: 08/01/2019 13:28   CT ANGIO NECK W OR WO CONTRAST  Result Date: 08/05/2019 CLINICAL DATA:  Right-sided weakness EXAM: CT ANGIOGRAPHY HEAD AND NECK TECHNIQUE: Multidetector CT imaging of the head and neck was performed using the standard protocol during  bolus administration of intravenous contrast. Multiplanar CT image reconstructions and MIPs were obtained to evaluate the vascular anatomy. Carotid stenosis measurements (when applicable) are obtained utilizing NASCET criteria, using the distal internal carotid diameter as the denominator. CONTRAST:  16m OMNIPAQUE IOHEXOL 350 MG/ML SOLN COMPARISON:  06/19/2019 FINDINGS: CTA NECK Aortic arch: Calcified and irregular noncalcified plaque again identified along the arch. Great vessel origins are patent. Right carotid system: Patent with multifocal calcified and noncalcified plaque. Stable less than 50% stenosis at the ICA origin. Left carotid system: Patent with multifocal calcified and noncalcified plaque. As before, there is approximately 60% stenosis of the common carotid at the level of the thyroid. No measurable stenosis at the ICA origin with evidence of prior endarterectomy. Vertebral arteries: Patent with calcified plaque again noted at the origins. Skeleton: Advanced multilevel degenerative changes. Other neck: No acute abnormality. Upper chest: Left greater than right pleural effusions with associated atelectasis. Review of the MIP images confirms the above findings CTA HEAD Anterior circulation: Intracranial internal carotid arteries are patent with calcified plaque causing up to moderate stenosis. Middle and anterior cerebral arteries are patent. Unchanged moderate stenosis of the distal left A2 ACA. Posterior circulation: Intracranial vertebral arteries are patent with calcified and noncalcified plaque. There is unchanged moderate stenosis of the right vertebral artery beyond the PICA origin. Basilar artery is patent with mild atherosclerotic irregularity. Posterior cerebral arteries are patent proximally with atherosclerotic irregularity. There is unchanged diminished opacification of the superior division of the distal left PCA. Venous sinuses: Patent as allowed by contrast bolus timing. Review of the  MIP images confirms the above findings IMPRESSION: No substantial change since 06/19/2019. There is irregular noncalcified plaque along the aortic arch. Stable findings of stenosis including left common carotid artery, intracranial right vertebral artery, intracranial ICAs, and left A2 ACA. Persistent pleural effusions and associated atelectasis. Electronically Signed   By: PMacy MisM.D.   On: 08/05/2019 13:50   MR BRAIN WO CONTRAST  Result Date: 08/05/2019 CLINICAL DATA:  Right-sided weakness EXAM: MRI HEAD WITHOUT CONTRAST TECHNIQUE: Multiplanar, multiecho pulse sequences of the brain and surrounding structures were obtained without intravenous contrast. COMPARISON:  06/17/2019, correlation made with more recent CT imaging FINDINGS: Motion artifact is present. Brain: There are multiple small foci reduced diffusion involving bilateral cerebral hemispheres, bilateral cerebellum and bilateral central gray nuclei. This is superimposed on multifocal chronic infarcts. There is trace subdural hemorrhage along the falx as seen on prior CT. Foci of susceptibility right frontal and parietal lobes compatible with chronic microhemorrhage or less likely mineralization. Confluent areas of T2 hyperintensity in the supratentorial white matter are nonspecific but probably reflect advanced chronic microvascular ischemic changes. There is no intracranial mass or mass effect. Vascular: Major vessel flow voids at the skull base are preserved. Skull and upper cervical spine: Normal marrow signal is preserved. Sinuses/Orbits: Paranasal sinuses are aerated. Orbits are unremarkable. Other: Sella is unremarkable.  Mastoid air cells are clear. IMPRESSION: Numerous small acute infarcts involving multiple vascular territories bilaterally suggesting a central embolic etiology. Advanced chronic microvascular ischemic changes and multiple chronic infarcts. These results will be called to the ordering clinician or representative by the  Radiologist Assistant, and communication documented in the PACS or [employer] Electronically Signed   By: PMacy MisM.D.   On: 08/05/2019 11:47   IR IVC FILTER PLMT / S&I /Burke KeelsGUID/MOD SED  Result Date: 08/06/2019 CLINICAL DATA:  Right lower extremity DVT. Intracranial hemorrhage, a relative contraindication to anticoagulation. Caval filtration requested. EXAM: INFERIOR VENACAVOGRAM IVC FILTER PLACEMENT UNDER FLUOROSCOPY FLUOROSCOPY TIME:  72 seconds; 17 mGy TECHNIQUE: Patency of the right IJ vein was confirmed with ultrasound with image documentation. An appropriate skin site was determined. Skin site was marked, prepped  with chlorhexidine, and draped using maximum barrier technique. The region was infiltrated locally with 1% lidocaine. Intravenous Fentanyl 12.9mg were administered as conscious sedation during continuous monitoring of the patient's level of consciousness and physiological / cardiorespiratory status by the radiology RN, with a total moderate sedation time of 13 minutes. Under real-time ultrasound guidance, the right IJ vein was accessed with a 21 gauge micropuncture needle; the needle tip within the vein was confirmed with ultrasound image documentation. The needle was exchanged over a 018 guidewire for a transitional dilator, which allow advancement of the BVid[facility] Hospitalwire into the IVC. A long 6 French vascular sheath was placed for inferior venacavography. This demonstrated no caval thrombus. Renal vein inflows were evident. The DC[facility] LLCIVC filter was advanced through the sheath and successfully deployed under fluoroscopy at the L3 level. Followup cavagram demonstrates stable filter position and no evident complication. The sheath was removed and hemostasis achieved at the site. No immediate complication. IMPRESSION: 1. Normal IVC. No thrombus or significant anatomic variation. 2. Technically successful infrarenal IVC filter placement. This is a retrievable model. PLAN: This IVC filter  is potentially retrievable. The patient will be assessed for filter retrieval by Interventional Radiology in approximately 8-12 weeks. Further recommendations regarding filter retrieval, continued surveillance or declaration of device permanence, will be made at that time. Electronically Signed   By: DLucrezia EuropeM.D.   On: 08/06/2019 08:23   PERIPHERAL VASCULAR CATHETERIZATION  Result Date: 07/13/2019 . Patient name: Christina MESTASMRN: [id number]: [id number]: female 07/12/2019 Pre-operative Diagnosis: right leg rest pain Post-operative diagnosis:  Same Surgeon:  WAnnamarie MajorProcedure Performed:  1.  Ultrasound-guided access, left femoral artery  2.  Shockwave intra-arterial lithotripsy, right superficial femoral artery  3.  Drug-coated balloon angioplasty, right superficial femoral artery  4.  Conscious sedation, 52 minutes  Indications: The patient underwent last week with intervention of the left leg.  She comes back today for right leg intervention. Procedure:  The patient was identified in the holding area and taken to room 8.  The patient was then placed supine on the table and prepped and draped in the usual sterile fashion.  A time out was called.  Conscious sedation was administered with the use of IV fentanyl and Versed under continuous physician and nurse monitoring.  Heart rate, blood pressure, and oxygen saturation were continuously monitored.  Total sedation time was 52 minutes.  Ultrasound was used to evaluate the left common femoral artery.  It was patent .  A digital ultrasound image was acquired.  A micropuncture needle was used to access the left common femoral artery under ultrasound guidance.  An 018 wire was advanced without resistance and a micropuncture sheath was placed.  The 018 wire was removed and a benson wire was placed.  The micropuncture sheath was exchanged for a 6 French 45 cm Terumo sheath.  A versa core wire was advanced over the aortic bifurcation which was followed  by the 6 French sheath which was placed into the right superficial femoral artery.  The patient was then fully heparinized.  Next, right leg runoff was performed Findings:  Right Lower Extremity: Right common femoral and profundofemoral artery widely patent.  The superficial femoral artery is patent however the proximal third of the artery has luminal narrowing draining then 50%.  At the adductor canal there is a short segment lesion which is heavily calcified with greater than 70% stenosis.  The anterior tibial is the dominant runoff vessel.  Intervention: At this  point, the patient was fully heparinized.  I advanced a 014 wire across the lesions into the below-knee popliteal artery.  I selected a 4 x 40 shockwave lithotripsy balloon and perform balloon angioplasty with lithotripsy in the superficial femoral artery for lesion length of approximately 200 cm.  Once this was completed at 2 and 4 atm on the balloon, a Ranger 4 x 200 balloon was inserted and drug-coated balloon angioplasty was performed for 3 minutes.  Completion imaging showed inline flow with residual stenosis less than 10%.  There was a nonflow limiting dissection at the proximal and distal edge of the intervention which I elected to monitor.  I did not want to put a stent in because of the small size of her arteries.  At this point sheath and wire were removed.  The patient particularly here for sheath electrocoagulation profile correct Impression:  #1 successful shockwave intra-arterial lithotripsy and subsequent drug-coated balloon angioplasty using 4 mm balloons to the right superficial femoral artery with minimal residual stenosis.  There was a nonflow limiting dissection that was not intervened on.  Theotis Burrow, M.D., [facility] [place] Office: [phone] Pager:  [phone]  PERIPHERAL VASCULAR CATHETERIZATION  Result Date: 07/08/2019 Patient name: Christina [name] MRN: [id number] DOB: September 05, 1950 Sex:  female 07/08/2019 Pre-operative Diagnosis: Severe bilateral PAD Post-operative diagnosis:  Same Surgeon:  Annamarie Major Procedure Performed:  1.  Ultrasound-guided access, right femoral artery  2.  Abdominal aortogram  3.  Bilateral lower extremity runoff  4.  Shockwave intra-arterial lithotripsy, left superficial femoral artery  5.  Shockwave intra-arterial lithotripsy, left common iliac artery  6.  Drug-coated balloon angioplasty, left superficial femoral artery, left common iliac artery  7.  Stent, Left common iliac artery  8.  Stent, left external iliac artery  9.  Conscious sedation 122 minutes Indications: The patient has severe bilateral atherosclerotic vascular disease with ulceration of the left toes and rest pain.  She is here today for further evaluation, possible intervention Procedure:  The patient was identified in the holding area and taken to room 8.  The patient was then placed supine on the table and prepped and draped in the usual sterile fashion.  A time out was called.  Conscious sedation was administered with the use of IV fentanyl and Versed under continuous physician and nurse monitoring.  Heart rate, blood pressure, and oxygen saturation were continuously monitored.  Total sedation time was 122 minutes.  Ultrasound was used to evaluate the right common femoral artery.  It was patent .  A digital ultrasound image was acquired.  A micropuncture needle was used to access the right common femoral artery under ultrasound guidance.  An 018 wire was advanced without resistance and a micropuncture sheath was placed.  The 018 wire was removed and a benson wire was placed.  The micropuncture sheath was exchanged for a 5 french sheath.  An omniflush catheter was advanced over the wire to the level of L-1.  An abdominal angiogram was obtained.  Next, the catheter was pulled out of the aortic bifurcation and bilateral runoff was performed. Findings:  Aortogram: Bilateral renal arteries are widely patent.   The infrarenal abdominal aorta is heavily calcified.  There are areas of hypodensity in the mid infrarenal abdominal aorta.  Pressure gradients across this area were checked and found to not be significant.  The right common iliac artery is heavily calcified and irregular however no hemodynamically significant stenosis was identified.  The left common iliac  artery has approximately a 50% stenosis at its origin and a 70% stenosis in the midportion.  There was also a greater than 50% stenosis at the origin of the left external iliac artery  Right Lower Extremity: The right common femoral and profundofemoral artery heavily calcified but patent.  The superficial femoral artery is patent with a greater than 90% stenosis in the midportion as well as a approximate 50% stenosis in the adductor canal.  The anterior tibial is the dominant runoff across the ankle however it does have a abrupt cut off as it crosses the ankle.  The peroneal artery is patent throughout its course.  The posterior tibial artery is occluded  Left Lower Extremity: The left common femoral and profundofemoral artery are calcified but patent without stenosis.  The superficial femoral artery is small in caliber.  There is a greater than 90% stenosis in the adductor canal.  The popliteal artery is widely patent.  There is two-vessel runoff via the posterior tibial and peroneal artery.  The anterior tibial artery is occluded. Intervention: After the above images were acquired the decision made to proceed with intervention.  A 6 French 45 cm sheath was advanced into the left external iliac artery.  The patient was fully heparinized.  Using a 014 wire and a quick cross catheter the lesion in the left superficial femoral artery was crossed.  I then performed shockwave intra-arterial lithotripsy of this lesion for a total of 150 seconds using a 4 x 40 balloon.  I then performed drug-coated balloon angioplasty of this lesion using a 4 x 40 impact balloon.  The  balloon was held for 2 atm.  Completion imaging showed a nonflow limiting dissection and also a small intimal flap.  I then reinserted the 4 x 40 balloon and repeated balloon angioplasty at a low atmospheres for 3 minutes.  Follow-up imaging showed near resolution of the intimal flap and improved appearance of the nonflow limiting dissection. Next attention was turned towards the common iliac artery.  I performed shockwave intra-arterial lithotripsy of this heavily calcified lesion using the same 4 x 40 balloon.  A total of 120 seconds were performed.  I then used a Ranger 6 x 40 drug-coated balloon for 3 minutes at nominal pressure.  Completion imaging showed persistent stenosis in this area and so I elected to primarily stent this.  This was done with a Herculink 8 x 22.  Follow-up imaging showed resolution of the common iliac stenosis however now the origin of the external iliac artery appeared greater than 70% and I felt that it needed to be addressed and so this was stented using a Abbott self-expanding 8 x 40 stent which was postdilated with a 7 mm balloon.  Completion imaging showed resolution of the stenosis within the left iliac system.  Catheters and wires were removed.  The long 6 French sheath was exchanged out for a short 7 [place] sheath.  The patient be taken the holding area for sheath pull once her coagulation profile correct. Impression:  #1 greater than 70% left common iliac artery stenosis treated with shockwave intra-arterial lithotripsy, drug-coated balloon, and ultimate stenting using an 8 x 22 balloon expandable stent.  #2 proximal left external iliac artery stenosis successfully treated using an 8 x 40 self-expanding stent  #3 near total occlusion of the superficial femoral artery on the left at the adductor canal treated using shockwave intra-arterial lithotripsy and drug-coated balloon angioplasty with a 4 mm balloon.  #4 two-vessel runoff via the  posterior tibial and peroneal artery on the  left  #5 there are tandem lesions that are hemodynamically significant in the right superficial femoral artery with two-vessel runoff.  6.  The patient will be brought back for percutaneous intervention of the right leg when she recovers from this procedure. Theotis Burrow, M.D., [facility] [place] Office: [phone] Pager:  [phone]  IR Removal Tun Cv Cath W/O FL  Result Date: 07/14/2019 INDICATION: End-stage renal disease on hemodialysis. Now with functional arteriovenous fistula. Request for removal hemodialysis catheter which was placed by vascular surgery. EXAM: REMOVAL OF TUNNELED HEMODIALYSIS CATHETER MEDICATIONS: 1% lidocaine 3 mL COMPLICATIONS: None immediate. PROCEDURE: Informed written consent was obtained from the patient following an explanation of the procedure, risks, benefits and alternatives to treatment. A time out was performed prior to the initiation of the procedure. Maximal barrier sterile technique was utilized including caps, mask, sterile gowns, sterile gloves, large sterile drape, hand hygiene, and ChloraPrep. 1% lidocaine with epinephrine was injected under sterile conditions along the subcutaneous tunnel. Utilizing a combination of blunt dissection and gentle traction, the catheter was removed intact. Hemostasis was obtained with manual compression. A dressing was placed. The patient tolerated the procedure well without immediate post procedural complication. IMPRESSION: Successful removal of tunneled dialysis catheter. Read by: Gareth Eagle, PA-C Electronically Signed   By: Lucrezia Europe M.D.   On: 07/14/2019 11:51   DG CHEST PORT 1 VIEW  Result Date: 08/06/2019 CLINICAL DATA:  Follow-up pleural effusion EXAM: PORTABLE CHEST 1 VIEW COMPARISON:  Four days ago FINDINGS: Extubation of the trachea and esophagus. Left subclavian line with tip in good position. Interstitial opacity and probable layering pleural fluid which is increased. There is also  hazy airspace disease. No pneumothorax. Cardiomegaly and vascular pedicle widening. IMPRESSION: Edema and pleural effusions which have progressed from 4 days ago. Electronically Signed   By: Monte Fantasia M.D.   On: 08/06/2019 08:56   DG CHEST PORT 1 VIEW  Result Date: 08/02/2019 CLINICAL DATA:  Endotracheal tube placement. EXAM: PORTABLE CHEST 1 VIEW COMPARISON:  Earlier this day. FINDINGS: Endotracheal tube tip 4.8 cm from the carina. Tip and side port of the enteric tube below the diaphragm in the stomach. Left subclavian central line unchanged in the SVC. Stable cardiomegaly. Unchanged mediastinal contours with aortic atherosclerosis. Interstitial thickening suspicious for pulmonary edema, similar. Minimal retrocardiac opacity and blunting of the costophrenic angle. There is no pneumothorax. IMPRESSION: 1. Endotracheal tube tip 4.8 cm from the carina. Enteric tube in place. 2. Unchanged cardiomegaly and interstitial opacities suspicious for pulmonary edema. Possible small left pleural effusion. Electronically Signed   By: Keith Rake M.D.   On: 08/02/2019 18:59   DG Chest Port 1 View  Result Date: 08/01/2019 CLINICAL DATA:  Intubation.  Assess for pneumothorax. EXAM: PORTABLE CHEST 1 VIEW COMPARISON:  August 01, 2019 7:58 a.m. FINDINGS: Endotracheal tube is identified 5.2 cm from carina. There is no pleural line to suggest pneumothorax. Nasogastric tube is identified distal tip not included on film but is at least in the stomach. Mild diffuse increased interstitium is identified bilaterally. Patchy opacities of both lung bases are noted. The heart size is mildly enlarged. The bony structures are stable. IMPRESSION: 1. Endotracheal tube 5.2 cm from carina. No pneumothorax. 2. Mild pulmonary edema. 3. Patchy opacities of both lung bases, superimposed pneumonia is not excluded. Electronically Signed   By: Abelardo Diesel M.D.   On: 08/01/2019 13:05   DG Humerus Right  Result  Date: 08/04/2019 CLINICAL  DATA:  Right-sided weakness history of fall EXAM: RIGHT HUMERUS - 2+ VIEW COMPARISON:  None. FINDINGS: There is no evidence of fracture or other focal bone lesions. Soft tissues are unremarkable. IMPRESSION: Negative. Electronically Signed   By: Donavan Foil M.D.   On: 08/04/2019 19:31   ECHOCARDIOGRAM COMPLETE  Result Date: 08/01/2019    ECHOCARDIOGRAM REPORT   Patient Name:   Christina [name] Date of Exam: 08/01/2019 Medical Rec #:  [id number]          Height:       62.0 in Accession #:    [id number]         Weight:       89.3 lb Date of Birth:  [birth year]           BSA:          1.357 m Patient Age:    85 years           BP:           153/69 mmHg Patient Gender: F                  HR:           49 bpm. Exam Location:  Inpatient Procedure: 2D Echo, Cardiac Doppler and Color Doppler Indications:    Cardiac arrest ([place]) [427.5.ICD-9-CM]  History:        Patient has prior history of Echocardiogram examinations, most                 recent 06/20/2019. CAD, Stroke and COPD, Arrythmias:Atrial                 Fibrillation, Signs/Symptoms:Chest Pain; Risk                 Factors:Hypertension, Dyslipidemia and Former Smoker.  Sonographer:    Vickie Epley RDCS Referring Phys: [id number] GRACE E BOWSER  Sonographer Comments: Echo performed with patient supine and on artificial respirator. IMPRESSIONS  1. Left ventricular ejection fraction, by estimation, is 55 to 60%. The left ventricle has normal function. The left ventricle has no regional wall motion abnormalities. There is the interventricular septum is mildly flattened in systole and diastole, consistent with right ventricular pressure and volume overload.  2. Right ventricular systolic function is mildly reduced. The right ventricular size is normal. There is severely elevated pulmonary artery systolic pressure. The estimated right ventricular systolic pressure is 05.3 mmHg.  3. Left atrial size was mildly dilated.  4. The mitral valve is abnormal. Mild mitral valve  regurgitation.  5. Tricuspid valve regurgitation is mild to moderate.  6. The aortic valve is tricuspid. Aortic valve regurgitation is trivial. Mild aortic valve sclerosis is present, with no evidence of aortic valve stenosis.  7. The inferior vena cava is normal in size with <50% respiratory variability, suggesting right atrial pressure of 8 mmHg. FINDINGS  Left Ventricle: Left ventricular ejection fraction, by estimation, is 55 to 60%. The left ventricle has normal function. The left ventricle has no regional wall motion abnormalities. The left ventricular internal cavity size was normal in size. There is  borderline left ventricular hypertrophy. The interventricular septum is flattened in systole and diastole, consistent with right ventricular pressure and volume overload. Left ventricular diastolic parameters are consistent with Grade I diastolic dysfunction (impaired relaxation). Right Ventricle: The right ventricular size is normal. No increase in right ventricular wall thickness. Right ventricular systolic function is mildly reduced. There is severely elevated pulmonary artery  systolic pressure. The tricuspid regurgitant velocity is 3.67 m/s, and with an assumed right atrial pressure of 8 mmHg, the estimated right ventricular systolic pressure is 63.8 mmHg. Left Atrium: Left atrial size was mildly dilated. Right Atrium: Right atrial size was normal in size. Pericardium: There is no evidence of pericardial effusion. Mitral Valve: The mitral valve is abnormal. There is mild thickening of the mitral valve leaflet(s). There is mild calcification of the mitral valve leaflet(s). Mild to moderate mitral annular calcification. Mild mitral valve regurgitation. Tricuspid Valve: The tricuspid valve is grossly normal. Tricuspid valve regurgitation is mild to moderate. Aortic Valve: The aortic valve is tricuspid. Aortic valve regurgitation is trivial. Mild aortic valve sclerosis is present, with no evidence of aortic  valve stenosis. Mild aortic valve annular calcification. Pulmonic Valve: The pulmonic valve was grossly normal. Pulmonic valve regurgitation is trivial. Aorta: The aortic root is normal in size and structure. Venous: The inferior vena cava is normal in size with less than 50% respiratory variability, suggesting right atrial pressure of 8 mmHg. IAS/Shunts: No atrial level shunt detected by color flow Doppler.  LEFT VENTRICLE PLAX 2D LVIDd:         4.20 cm     Diastology LVIDs:         2.70 cm     LV e' lateral:   4.95 cm/s LV PW:         1.00 cm     LV E/e' lateral: 16.0 LV IVS:        1.00 cm     LV e' medial:    3.09 cm/s LVOT diam:     1.50 cm     LV E/e' medial:  25.6 LV SV:         40 LV SV Index:   29 LVOT Area:     1.77 cm  LV Volumes (MOD) LV vol d, MOD A2C: 74.2 ml LV vol d, MOD A4C: 99.4 ml LV vol s, MOD A2C: 38.1 ml LV vol s, MOD A4C: 50.3 ml LV SV MOD A2C:     36.1 ml LV SV MOD A4C:     99.4 ml LV SV MOD BP:      43.9 ml RIGHT VENTRICLE RV S prime:     6.58 cm/s TAPSE (M-mode): 1.1 cm LEFT ATRIUM             Index       RIGHT ATRIUM           Index LA diam:        4.70 cm 3.46 cm/m  RA Area:     15.30 cm LA Vol (A2C):   47.7 ml 35.15 ml/m RA Volume:   39.10 ml  28.81 ml/m LA Vol (A4C):   50.5 ml 37.22 ml/m LA Biplane Vol: 49.4 ml 36.41 ml/m  AORTIC VALVE LVOT Vmax:   110.00 cm/s LVOT Vmean:  68.000 cm/s LVOT VTI:    0.224 m  AORTA Ao Root diam: 2.90 cm MITRAL VALVE               TRICUSPID VALVE MV Area (PHT): 2.26 cm    TR Peak grad:   53.9 mmHg MV Decel Time: 335 msec    TR Vmax:        367.00 cm/s MV E velocity: 79.20 cm/s MV A velocity: 93.20 cm/s  SHUNTS MV E/A ratio:  0.85        Systemic VTI:  0.22 m  Systemic Diam: 1.50 cm Christina Lesches MD Electronically signed by Christina Lesches MD Signature Date/Time: 08/01/2019/5:36:32 PM    Final    DG FEMUR, MIN 2 VIEWS RIGHT  Result Date: 08/04/2019 CLINICAL DATA:  Fall, right-sided weakness EXAM: RIGHT FEMUR 2 VIEWS  COMPARISON:  None. FINDINGS: No fracture or malalignment. Vascular calcifications. Edema within the soft tissues. IMPRESSION: No acute osseous abnormality. Electronically Signed   By: Donavan Foil M.D.   On: 08/04/2019 19:34   VAS [place] LOWER EXTREMITY VENOUS (DVT)  Result Date: 08/05/2019  Lower Venous DVTStudy Indications: Swelling.  Comparison Study: no prior Performing Technologist: Abram Sander RVS  Examination Guidelines: A complete evaluation includes B-mode imaging, spectral Doppler, color Doppler, and power Doppler as needed of all accessible portions of each vessel. Bilateral testing is considered an integral part of a complete examination. Limited examinations for reoccurring indications may be performed as noted. The reflux portion of the exam is performed with the patient in reverse Trendelenburg.  +---------+---------------+---------+-----------+----------+-----------------+ RIGHT    CompressibilityPhasicitySpontaneityPropertiesThrombus Aging    +---------+---------------+---------+-----------+----------+-----------------+ CFV      Partial        Yes      Yes                  Age Indeterminate +---------+---------------+---------+-----------+----------+-----------------+ SFJ      None                                         Age Indeterminate +---------+---------------+---------+-----------+----------+-----------------+ FV Prox  None                                         Age Indeterminate +---------+---------------+---------+-----------+----------+-----------------+ FV Mid   None                                         Age Indeterminate +---------+---------------+---------+-----------+----------+-----------------+ FV DistalNone                                         Age Indeterminate +---------+---------------+---------+-----------+----------+-----------------+ PFV      None                                         Age Indeterminate  +---------+---------------+---------+-----------+----------+-----------------+ POP      None           No       No                   Age Indeterminate +---------+---------------+---------+-----------+----------+-----------------+ PTV      None                                         Age Indeterminate +---------+---------------+---------+-----------+----------+-----------------+ PERO     None  Age Indeterminate +---------+---------------+---------+-----------+----------+-----------------+ GSV      None                                                           +---------+---------------+---------+-----------+----------+-----------------+ EIV                     Yes      Yes                                    +---------+---------------+---------+-----------+----------+-----------------+   +----+---------------+---------+-----------+----------+--------------+ LEFTCompressibilityPhasicitySpontaneityPropertiesThrombus Aging +----+---------------+---------+-----------+----------+--------------+ CFV Full           Yes      Yes                                 +----+---------------+---------+-----------+----------+--------------+     Summary: RIGHT: - Findings consistent with age indeterminate deep vein thrombosis involving the right common femoral vein, SF junction, right femoral vein, right proximal profunda vein, right popliteal vein, and right posterior tibial veins. - Findings consistent with age indeterminate superficial vein thrombosis involving the right great saphenous vein.   *See table(s) above for measurements and observations. Electronically signed by Harold Barban MD on 08/05/2019 at 5:18:17 PM.    Final    VAS [place] UPPER EXTREMITY VENOUS DUPLEX  Result Date: 08/05/2019 UPPER VENOUS STUDY  Indications: Swelling Comparison Study: no prior Performing Technologist: Abram Sander RVS  Examination Guidelines: A complete evaluation  includes B-mode imaging, spectral Doppler, color Doppler, and power Doppler as needed of all accessible portions of each vessel. Bilateral testing is considered an integral part of a complete examination. Limited examinations for reoccurring indications may be performed as noted.  Right Findings: +----------+------------+---------+-----------+----------+-------+ RIGHT     CompressiblePhasicitySpontaneousPropertiesSummary +----------+------------+---------+-----------+----------+-------+ IJV           Full       Yes       Yes                      +----------+------------+---------+-----------+----------+-------+ Subclavian    Full       Yes       Yes                      +----------+------------+---------+-----------+----------+-------+ Axillary      Full       Yes       Yes                      +----------+------------+---------+-----------+----------+-------+ Brachial      Full       Yes       Yes                      +----------+------------+---------+-----------+----------+-------+ Radial        Full                                          +----------+------------+---------+-----------+----------+-------+ Ulnar         Full                                          +----------+------------+---------+-----------+----------+-------+  Cephalic      Full                                          +----------+------------+---------+-----------+----------+-------+ Basilic       Full                                          +----------+------------+---------+-----------+----------+-------+  Summary:  Right: No evidence of deep vein thrombosis in the upper extremity. No evidence of superficial vein thrombosis in the upper extremity.  *See table(s) above for measurements and observations.  Diagnosing physician: Harold Barban MD Electronically signed by Harold Barban MD on 08/05/2019 at 5:17:35 PM.    Final     Time Spent in minutes  30   Lala Lund M.D on  08/07/2019 at 10:44 AM  To page go to [email or web address] - password [facility]

## 2019-08-07 NOTE — Progress Notes (Signed)
MEWs yellow this morning due to RR, per night shift RN this has been ongoing. Will continue VS Q 4 hours.

## 2019-08-07 NOTE — Evaluation (Signed)
Occupational Therapy Evaluation Patient Details Name: Christina [name] MRN: [id number] DOB: 02/14/1950 Today's Date: 08/07/2019    History of Present Illness Pt is 69 yo female transferred from [facility] 7/5 post asystolic arrest with 10 minute downtime. Hyperkalemia thought to be source of arrest.  Pt with acute metabolic encephalopathy s/p arrest with trace subdural hematoma along the falx noted 7/8. RLE DVT with IVC filter placed 7/9 as anticoagulation not an option. MRI of her brain showed:  numerous small acute infarcts involving multiple vascular territories bilaterally; advanced chronic microvascular ischemic changes and multiple chronic infarcts.  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 She demonstrates the below listed deficits and will benefit from continued OT to maximize safety and independence with BADLs.  Pt presents to OT with Rt hemiparesis, generalized weakness, impaired balance, impaired cognition, motor planning deficits and apraxia.  She currently requires min - total A for ADLs and mod A for functional transfers.  She reports she lives with her daughter and was independent with ADLs.  She relies on family members for transportation.  Feel she will require post acute rehab - recommend CIR.       Follow Up Recommendations  CIR    Equipment Recommendations  None recommended by OT    Recommendations for Other Services Rehab consult     Precautions / Restrictions Precautions Precautions: Fall      Mobility Bed Mobility Overal bed mobility: Needs Assistance Bed Mobility: Supine to Sit;Sit to Supine     Supine to sit: Mod assist Sit to supine: Mod assist   General bed mobility comments: max verbal and tactile cues to initiate, motor plan, and sequence the movement   Transfers Overall transfer level:  Needs assistance Equipment used: 1 person hand held assist Transfers: Sit to/from Stand Sit to Stand: Mod assist         General transfer comment: Pt requires mod A to power up into standing, and mod A shift weight and advance Rt LE to side step up the EOB     Balance Overall balance assessment: Needs assistance Sitting-balance support: Feet supported;Single extremity supported Sitting balance-Leahy Scale: Fair Sitting balance - Comments: pt requires min guard assist to maintain EOB sitting    Standing balance support: Single extremity supported Standing balance-Leahy Scale: Poor Standing balance comment: requires mod A for static standing                            ADL either performed or assessed with clinical judgement   ADL Overall ADL's : Needs assistance/impaired Eating/Feeding: Minimal assistance;Sitting;Bed level   Grooming: Brushing hair;Total assistance;Sitting Grooming Details (indicate cue type and reason): Pt unable to orient comb correctly in her hand or to her had to effectively comb hair despite max hand over hand assist  Upper Body Bathing: Moderate assistance;Sitting   Lower Body Bathing: Maximal assistance;Sit to/from stand   Upper Body Dressing : Maximal assistance;Sitting   Lower Body Dressing: Total assistance;Sit to/from stand   Toilet Transfer: Moderate assistance;Stand-pivot;BSC   Toileting- Clothing Manipulation and Hygiene: Total assistance;Sit to/from stand       Functional mobility during ADLs: Moderate assistance       Vision Baseline Vision/History: Wears glasses Vision Assessment?: Yes Additional Comments: accurate assessment difficult due to impaired attention and command following.  Pt demonstrates full EOMs and visual fields appear grossly intact      Perception Perception Perception Tested?: Yes Comments: pt unable to correctly identify Lt and Rt    Praxis Praxis Praxis tested?: Deficits Deficits:  Initiation;Ideation;Ideomotor    Pertinent Vitals/Pain Pain Assessment: Faces Faces Pain Scale: Hurts little more Pain Location: chest/ribs  Pain Descriptors / Indicators: Guarding;Grimacing Pain Intervention(s): Monitored during session;Repositioned     Hand Dominance Right   Extremity/Trunk Assessment Upper Extremity Assessment Upper Extremity Assessment: RUE deficits/detail;LUE deficits/detail RUE Deficits / Details: Pt demonstrates shoulder elevation ~75*; ~25% gross grasp and release of Rt hand  RUE Coordination: decreased fine motor;decreased gross motor LUE Deficits / Details: AROM WFL.  Grossly 4/5  LUE Coordination: decreased fine motor   Lower Extremity Assessment Lower Extremity Assessment: Defer to PT evaluation       Communication Communication Communication: No difficulties   Cognition Arousal/Alertness: Awake/alert Behavior During Therapy: WFL for tasks assessed/performed Overall Cognitive Status: Impaired/Different from baseline Area of Impairment: Orientation;Attention;Memory;Following commands;Safety/judgement;Awareness                 Orientation Level: Disoriented to;Time;Situation;Place Current Attention Level: Selective;Sustained Memory: Decreased short-term memory Following Commands: Follows one step commands consistently;Follows multi-step commands inconsistently Safety/Judgement: Decreased awareness of safety;Decreased awareness of deficits Awareness: Intellectual Problem Solving: Slow processing;Decreased initiation;Difficulty sequencing;Requires verbal cues;Requires tactile cues General Comments: Pt is unaware of what city she is in.  She reports she is in the hospital because she fell out of bed. She requires mod A/cues to initiate activity.  She was lying on a bedpan with no awareness of such, and had no awareness that the purpose of a bedpan is to void into    General Comments  VSS     Exercises     Shoulder Instructions      Home  Living Family/patient expects to be discharged to:: Private residence Living Arrangements: Children Available Help at Discharge: Family;Available 24 hours/day Type of Home: Apartment Home Access: Level entry     Home Layout: One level     Bathroom Shower/Tub: Occupational psychologist: Handicapped height     Home Equipment: Environmental consultant - 4 wheels   Additional Comments: Pt reports she lives with her daughter      Prior Functioning/Environment Level of Independence: Needs assistance  Gait / Transfers Assistance Needed: Pt reports she ambulates without AD  ADL's / Homemaking Assistance Needed: Reports independent with ADLs   Comments: Pt reports she, nor her daughter drive. Her sister provides some transportation, but often misses HD due to lack of transportation (per her report)        OT Problem List: Decreased strength;Decreased range of motion;Decreased activity tolerance;Impaired balance (sitting and/or standing);Impaired vision/perception;Decreased coordination;Decreased cognition;Decreased safety awareness;Decreased knowledge of use of DME or AE;Impaired sensation;Impaired UE functional use      OT Treatment/Interventions: Self-care/ADL training;Neuromuscular education;Energy conservation;DME and/or AE instruction;Therapeutic activities;Cognitive remediation/compensation;Visual/perceptual remediation/compensation;Patient/family education;Balance training    OT Goals(Current goals can be found in the care plan section) Acute Rehab OT Goals Patient Stated Goal: Pt unable to state  OT Goal Formulation: With patient Time For Goal Achievement: 08/21/19 Potential to Achieve Goals: Good ADL Goals Pt Will Perform Grooming: with min assist;standing Pt Will Perform Upper Body Bathing: with min assist;sitting Pt Will Perform Lower Body Bathing: with mod assist;sit to/from stand Pt Will Perform Upper Body Dressing: with mod assist;sitting Pt Will Transfer to Toilet: with min  assist;stand pivot transfer;bedside commode Pt Will Perform Toileting - Clothing Manipulation  and hygiene: with mod assist;sit to/from stand  OT Frequency: Min 2X/week   Barriers to D/C: Decreased caregiver support          Co-evaluation              AM-PAC OT "6 Clicks" Daily Activity     Outcome Measure Help from another person eating meals?: A Little Help from another person taking care of personal grooming?: A Lot Help from another person toileting, which includes using toliet, bedpan, or urinal?: A Lot Help from another person bathing (including washing, rinsing, drying)?: A Lot Help from another person to put on and taking off regular upper body clothing?: A Lot Help from another person to put on and taking off regular lower body clothing?: Total 6 Click Score: 12   End of Session Nurse Communication: Mobility status  Activity Tolerance: Patient tolerated treatment well Patient left: in bed;with call bell/phone within reach;with bed alarm set  OT Visit Diagnosis: Unsteadiness on feet (R26.81);Hemiplegia and hemiparesis Hemiplegia - Right/Left: Right Hemiplegia - dominant/non-dominant: Dominant Hemiplegia - caused by: Cerebral infarction                Time: 1520-1551 OT Time Calculation (min): 31 min Charges:  OT General Charges $OT Visit: 1 Visit OT Evaluation $OT Eval Moderate Complexity: 1 Mod OT Treatments $Self Care/Home Management : 8-22 mins  Nilsa Nutting., OTR/L Acute Rehabilitation Services Pager [phone] Office [phone]   Lucille Passy M 08/07/2019, 5:53 PM

## 2019-08-07 NOTE — Progress Notes (Signed)
[facility] Progress Note  Subjective: seen in room, not coughing as much   Vitals:   08/07/19 0830 08/07/19 0840 08/07/19 1129 08/07/19 1429  BP: 139/85  (!) 139/49 (!) 146/56  Pulse: 69 67 66 (!) 58  Resp:  '18 12 11  ' Temp: (!) 97 F (36.1 C)  97.6 F (36.4 C) 98.1 F (36.7 C)  TempSrc: Oral  Oral Oral  SpO2: 98% 97% 100% 100%  Weight:      Height:        Exam: General: chronically ill appearing, not SOB today Heart:RRR, s1s2 nl, no rubs Lungs: bibasilar mild crackles Abdomen:soft, Non-tender, non-distended Extremities: bilat LE/ UE pitting edema, improving Dialysis Access: AV fistula +bruit    OP HD: [facility] MWF  4h  40kg  2/2 bath  Hep none   LUE AVF  mircera 200 ug last 6/30  venofer 100 tiw  calc 0.75 tiw  Summary: Pt is a 69 y.o. yo female  with h/o HTN, CAD s/p stent, A. fib, anxiety depression, anemia, SBO, mesenteric ischemia s/p SMA stenting, multiple recent admissions,[facility] after cardiac arrest seen as a consultation for the management of ESRD.  Assessment/ Plan: 1. Cardiac arrest/V. tach: Probably contributed by hyperkalemia 2. Acute/ subacute LE DVT: going to IR for IVC filter today.  3. ESRD: MWF HD. AV fistula for the access.  HD this am, max UF as below 4. HTN/ volume overload: diffuse edema > was up 9kg, now 7kg over dry wt. Got 3 L off last HD. Needs sig vol removal this week on HD and needs to follow fluid restriction 5. Anemia of ESRD: Monitor hemoglobin. Recently got Mircera and iron.  Check iron studies.  Transfuse as needed. 6. Metabolic Bone Disease: Resume binders when able to take orally.  Apparently phosphorus was low yesterday.  Monitor lab. 7. CT head showed subdural hematoma.  Avoid heparin. 8. Disposition: Poor adherence with outpatient dialysis 9. DNR.   Rob Quintessa Simmerman 08/07/2019, 4:02 PM   Recent Labs  Lab 08/03/19 0346 08/03/19 1621  08/03/19 2033 08/06/19 0540 08/07/19 0333  K 3.3*  --    < > 4.7 4.4  BUN 43*  --    < > 52* 37*  CREATININE 4.41*  --    < > 5.52* 4.01*  CALCIUM 7.1*  --    < > 7.6* 7.4*  PHOS 6.3* 2.1*  --   --   --   HGB 7.2*  --    < > 7.3* 8.8*   < > = values in this interval not displayed.   Inpatient medications: . sodium chloride   Intravenous Once  . amiodarone  200 mg Oral BID  . amLODipine  10 mg Oral Daily  . arformoterol  15 mcg Nebulization BID  . aspirin  81 mg Oral Daily  . atorvastatin  40 mg Oral Daily  . budesonide (PULMICORT) nebulizer solution  0.5 mg Nebulization BID  . carvedilol  6.25 mg Oral BID WC  . chlorhexidine gluconate (MEDLINE KIT)  15 mL Mouth Rinse BID  . Chlorhexidine Gluconate Cloth  6 each Topical Q0600  . Chlorhexidine Gluconate Cloth  6 each Topical Q0600  . feeding supplement (NEPRO CARB STEADY)  237 mL Oral TID BM  . gabapentin  300 mg Oral BID  . mouth rinse  15 mL Mouth Rinse BID  . multivitamin  1 tablet Oral QHS  . neomycin-bacitracin-polymyxin   Topical BID  .  pantoprazole  40 mg Oral BID  . sodium chloride flush  10-40 mL Intracatheter Q12H   . [START ON 08/09/2019] ceFEPime (MAXIPIME) IV    . metronidazole 500 mg (08/07/19 1019)   acetaminophen, albuterol, docusate sodium, guaiFENesin, hydrALAZINE, HYDROcodone-acetaminophen, midodrine, ondansetron (ZOFRAN) IV, polyethylene glycol, traMADol

## 2019-08-08 ENCOUNTER — Inpatient Hospital Stay (HOSPITAL_COMMUNITY): Payer: Medicare Other

## 2019-08-08 DIAGNOSIS — R0789 Other chest pain: Secondary | ICD-10-CM

## 2019-08-08 LAB — BASIC METABOLIC PANEL
Anion gap: 12 (ref 5–15)
BUN: 54 mg/dL — ABNORMAL HIGH (ref 8–23)
CO2: 24 mmol/L (ref 22–32)
Calcium: 8.1 mg/dL — ABNORMAL LOW (ref 8.9–10.3)
Chloride: 97 mmol/L — ABNORMAL LOW (ref 98–111)
Creatinine, Ser: 5.29 mg/dL — ABNORMAL HIGH (ref 0.44–1.00)
GFR calc Af Amer: 9 mL/min — ABNORMAL LOW (ref >60)
GFR calc non Af Amer: 8 mL/min — ABNORMAL LOW (ref >60)
Glucose, Bld: 175 mg/dL — ABNORMAL HIGH (ref 70–99)
Potassium: 4.6 mmol/L (ref 3.5–5.1)
Sodium: 133 mmol/L — ABNORMAL LOW (ref 135–145)

## 2019-08-08 LAB — CBC WITH DIFFERENTIAL/PLATELET
Abs Immature Granulocytes: 0.1 10*3/uL — ABNORMAL HIGH (ref 0.00–0.07)
Basophils Absolute: 0 10*3/uL (ref 0.0–0.1)
Basophils Relative: 0 %
Eosinophils Absolute: 0.3 10*3/uL (ref 0.0–0.5)
Eosinophils Relative: 5 %
HCT: 30.8 % — ABNORMAL LOW (ref 36.0–46.0)
Hemoglobin: 9.6 g/dL — ABNORMAL LOW (ref 12.0–15.0)
Immature Granulocytes: 1 %
Lymphocytes Relative: 5 %
Lymphs Abs: 0.4 10*3/uL — ABNORMAL LOW (ref 0.7–4.0)
MCH: 29.4 pg (ref 26.0–34.0)
MCHC: 31.2 g/dL (ref 30.0–36.0)
MCV: 94.2 fL (ref 80.0–100.0)
Monocytes Absolute: 0.7 10*3/uL (ref 0.1–1.0)
Monocytes Relative: 9 %
Neutro Abs: 6.1 10*3/uL (ref 1.7–7.7)
Neutrophils Relative %: 80 %
Platelets: 111 10*3/uL — ABNORMAL LOW (ref 150–400)
RBC: 3.27 MIL/uL — ABNORMAL LOW (ref 3.87–5.11)
RDW: 19.5 % — ABNORMAL HIGH (ref 11.5–15.5)
WBC: 7.6 10*3/uL (ref 4.0–10.5)
nRBC: 0 % (ref 0.0–0.2)

## 2019-08-08 LAB — GLUCOSE, CAPILLARY: Glucose-Capillary: 29 mg/dL — CL (ref 70–99)

## 2019-08-08 LAB — BRAIN NATRIURETIC PEPTIDE: B Natriuretic Peptide: 2308.3 pg/mL — ABNORMAL HIGH (ref 0.0–100.0)

## 2019-08-08 LAB — PROCALCITONIN: Procalcitonin: 1.03 ng/mL

## 2019-08-08 LAB — MAGNESIUM: Magnesium: 2.3 mg/dL (ref 1.7–2.4)

## 2019-08-08 LAB — C-REACTIVE PROTEIN: CRP: 8.6 mg/dL — ABNORMAL HIGH (ref <1.0)

## 2019-08-08 MED ORDER — FENTANYL CITRATE (PF) 100 MCG/2ML IJ SOLN
25.0000 ug | Freq: Once | INTRAMUSCULAR | Status: AC
  Administered 2019-08-08: 25 ug via INTRAVENOUS
  Filled 2019-08-08: qty 2

## 2019-08-08 MED ORDER — DARBEPOETIN ALFA 60 MCG/0.3ML IJ SOSY
60.0000 ug | PREFILLED_SYRINGE | INTRAMUSCULAR | Status: DC
  Administered 2019-08-09: 60 ug via INTRAVENOUS
  Filled 2019-08-08: qty 0.3

## 2019-08-08 MED ORDER — HEPARIN SODIUM (PORCINE) 5000 UNIT/ML IJ SOLN
5000.0000 [IU] | Freq: Three times a day (TID) | INTRAMUSCULAR | Status: DC
  Administered 2019-08-10 – 2019-08-11 (×4): 5000 [IU] via SUBCUTANEOUS
  Filled 2019-08-08 (×5): qty 1

## 2019-08-08 NOTE — Progress Notes (Signed)
This chaplain responded to consult for Pt. spiritual care. The chaplain introduced herself and offered a pastoral presence to the Pt.  The chaplain learned the Pt. believes God desires her to bear the burden of illness. The chaplain will provide reflective listening for the Pt. as the Pt. explores her statement. The chaplain affirmed and encouraged the Pt. to ask questions about her upcoming Rehab.  The Pt. communicated a desire to get stronger but was unaware of the Rehab process.  The chaplain agreed to provide F/U at another time, the Pt. stated she was tired.

## 2019-08-08 NOTE — Progress Notes (Signed)
[facility] Progress Note   Subjective: C/O chest soreness. Bruising noted upper chest.    Objective Vitals:   08/08/19 0430 08/08/19 0738 08/08/19 0741 08/08/19 0819  BP: (!) 176/85  (!) 189/64 (!) 156/76  Pulse: 69  70   Resp: 16  20   Temp: 97.8 F (36.6 C)  (!) 97.2 F (36.2 C)   TempSrc: Oral  Oral   SpO2: 96% 99% 99%   Weight: 49.9 kg     Height:       Physical Exam General: Chronically ill appearing female in NAD Heart: S1,S2 RRR Lungs: decreased in bases few bibasilar crackles. No WOB. Respirations shallow, no tachypnea Abdomen: Active BS Extremities: No LE edema Dialysis Access: L AVF + bruit   Additional Objective Labs: Basic Metabolic Panel: Recent Labs  Lab 08/02/19 0015 08/02/19 1845 08/03/19 0346 08/03/19 1621 08/03/19 2033 08/06/19 0540 08/07/19 0333 08/08/19 0541  NA   < >  --  141  --    < > 132* 136 133*  K   < >  --  3.3*  --    < > 4.7 4.4 4.6  CL   < >  --  94*  --    < > 93* 98 97*  CO2   < >  --  34*  --    < > _0 GLUCOSE   < >  --  166*  --    < > 289* 138* 175*  BUN   < >  --  43*  --    < > 52* 37* 54*  CREATININE   < >  --  4.41*  --    < > 5.52* 4.01* 5.29*  CALCIUM   < >  --  7.1*  --    < > 7.6* 7.4* 8.1*  PHOS  --  5.9* 6.3* 2.1*  --   --   --   --    < > = values in this interval not displayed.   Liver Function Tests: Recent Labs  Lab 08/03/19 0346 08/05/19 0500 08/06/19 0540  AST 305* 62* 35  ALT 446* 222* 145*  ALKPHOS 73 86 75  BILITOT 0.8 1.0 1.1  PROT 3.9* 5.0* 4.5*  ALBUMIN 1.7* 2.1* 2.0*   No results for input(s): LIPASE, AMYLASE in the last 168 hours. CBC: Recent Labs  Lab 08/01/19 1208 08/01/19 1211 08/04/19 0331 08/04/19 0331 08/05/19 0500 08/05/19 0500 08/06/19 0540 08/07/19 0333 08/08/19 0541  WBC 9.1   < > 14.9*   < > 17.1*   < > 10.3 7.9 7.6  NEUTROABS 8.8*  --   --   --   --   --   --  6.2 6.1  HGB 8.5*   < > 8.0*   < > 7.7*   < > 7.3* 8.8* 9.6*  HCT 28.5*   < >  27.0*   < > 25.7*   < > 25.1* 28.7* 30.8*  MCV 96.3   < > 100.0  --  98.5  --  98.8 92.9 94.2  PLT 125*   < > 120*   < > 129*   < > 133* 94* 111*   < > = values in this interval not displayed.   Blood Culture    Component Value Date/Time   SDES TRACHEAL ASPIRATE 08/01/2019 1559   SPECREQUEST Normal 08/01/2019 1559   CULT  08/01/2019 1559    FEW Consistent with normal respiratory flora.  Performed at [facility], [place] [address] [place], [place] [address]    REPTSTATUS 08/04/2019 FINAL 08/01/2019 1559    Cardiac Enzymes: No results for input(s): CKTOTAL, CKMB, CKMBINDEX, TROPONINI in the last 168 hours. CBG: Recent Labs  Lab 08/07/19 0312 08/07/19 0412 08/07/19 0829 08/07/19 1231 08/07/19 1612  GLUCAP 129* 120* 84 92 178*   Iron Studies: No results for input(s): IRON, TIBC, TRANSFERRIN, FERRITIN in the last 72 hours. _0 @ Studies/Results: DG CHEST PORT 1 VIEW  Result Date: 08/08/2019 CLINICAL DATA:  69 year old female with chest pain. EXAM: PORTABLE CHEST 1 VIEW COMPARISON:  Chest radiograph dated 08/06/2019. FINDINGS: Left subclavian central venous line with tip close to the cavoatrial junction similar to prior radiograph. Small bilateral pleural effusions, right greater than left with bibasilar atelectasis or infiltrate. Slight improvement in aeration of the lungs and edema compared to prior radiograph. No pneumothorax. Stable cardiomegaly. Coronary vascular calcification and stent. Atherosclerotic calcification of the aorta. No acute osseous pathology. Osteopenia. IMPRESSION: Small bilateral pleural effusions with bibasilar atelectasis or infiltrate. Slightly improved aeration of the lungs. Electronically Signed   By: Anner Crete M.D.   On: 08/08/2019 02:33   Medications:  [START ON 08/09/2019] ceFEPime (MAXIPIME) IV     metronidazole 500 mg (08/08/19 0905)    sodium chloride   Intravenous Once   amiodarone  200 mg Oral BID   amLODipine  10 mg Oral  Daily   arformoterol  15 mcg Nebulization BID   aspirin  81 mg Oral Daily   atorvastatin  40 mg Oral Daily   budesonide (PULMICORT) nebulizer solution  0.5 mg Nebulization BID   carvedilol  6.25 mg Oral BID WC   chlorhexidine gluconate (MEDLINE KIT)  15 mL Mouth Rinse BID   Chlorhexidine Gluconate Cloth  6 each Topical Q0600   Chlorhexidine Gluconate Cloth  6 each Topical Q0600   Chlorhexidine Gluconate Cloth  6 each Topical Q0600   feeding supplement (NEPRO CARB STEADY)  237 mL Oral TID BM   gabapentin  300 mg Oral BID   mouth rinse  15 mL Mouth Rinse BID   multivitamin  1 tablet Oral QHS   neomycin-bacitracin-polymyxin   Topical BID   pantoprazole  40 mg Oral BID   sodium chloride flush  10-40 mL Intracatheter Q12H     OP HD: [facility] MWF  4h  40kg  2/2 bath  Hep none   LUE AVF  mircera 200 ug last 6/30  venofer 100 tiw  calc 0.75 tiw  Summary: Pt is a25 y.o.yo femalewith h/o HTN, CAD s/p stent, A. fib, anxiety depression, anemia, SBO, mesenteric ischemia s/p SMA stenting, multiple recent admissions,[facility] cardiac arrest seen as a consultation for the management of ESRD.  Assessment/ Plan: 1. Cardiac arrest/V. tach: Probably contributed by hyperkalemia 2. Acute/ subacute LE DVT: IVC filter placed in IR 08/05/2019 .  3. ESRD: MWF HD. AV fistula for the access. Last HD 08/06/2019. HD today to get back on MWF schedule. K+ 4.6. No heparin.  4. HTN/ volume overload: diffuse edema last week-was up 9kg over dry wt. Got 3.4 L off last HD. Post wt  49.9 kg today. Continue lowering volume as tolerated. 5. Anemia of ESRD: HGB 9.6 today, upward trend. No ESA since admission. Give Aranesp 60 mcg IV with HD tomorrow. Check Iron panel.  6. Metabolic Bone Disease:Resume binders when able to take orally.Apparently phosphorus was low yesterday. Monitor lab. 7.  CT head showed subdural hematoma.  Avoid heparin. 8. Disposition: Poor adherence with outpatient dialysis. Did better when she was in SNF with HD attendance. Would recommend DC to SNF again if possible.  9. DNR.  Nada Godley H. Shiryl Ruddy NP-C 08/08/2019, 9:32 AM  [employer] [phone]

## 2019-08-08 NOTE — Progress Notes (Signed)
Physical Therapy Treatment Patient Details Name: Christina [name] MRN: [id number] DOB: 1950/12/26 Today's Date: 08/08/2019    History of Present Illness Pt is 69 yo female transferred from [facility] 7/5 post asystolic arrest with 10 minute downtime. Hyperkalemia thought to be source of arrest.  Pt with acute metabolic encephalopathy s/p arrest with trace subdural hematoma along the falx noted 7/8. RLE DVT with IVC filter placed 7/9 as anticoagulation not an option. MRI of her brain showed:  numerous small acute infarcts involving multiple vascular territories bilaterally; advanced chronic microvascular ischemic changes and multiple chronic infarcts.  PMHx: HTN, HLD, CAD, CEA on L, SMA stent May 2021, CKD/ESRD, COPD, GERD, and GIB, polysubstance abuse including cocaine, medical non compliance     PT Comments    Pt agreeable to working with therapy, no recollection of why she is in the hospital. Pt with decreased use of R UE, however able to move against gravity when asked. R LE and L LE with similar strength assessed during supine exercise in bed. Pt requires maximal multimodal cuing for accomplishing mobility. Pt modA for bed mobility and modAx2 for transfers and ambulation of 2 feet to recliner with 2 person HHA. D/c plans remain appropriate at this time. PT will continue to follow acutely.    Follow Up Recommendations  CIR     Equipment Recommendations  Rolling walker with 5" wheels;Wheelchair (measurements PT);Wheelchair cushion (measurements PT);Other (comment) (TBD at next venue)       Precautions / Restrictions Precautions Precautions: Fall Precaution Comments: limited activity tolerance    Mobility  Bed Mobility Overal bed mobility: Needs Assistance Bed Mobility: Supine to Sit;Sit to Supine     Supine to sit: Mod assist     General bed mobility comments: max verbal and visual cues for reaching across to bed rail to initiate movement to EoB, requires modA for trunk to  upright, cued in scooting hips to EoB but unable to sequence initially, once pattern started with pad assist pt able to continue until feet were on floor  Transfers Overall transfer level: Needs assistance Equipment used: 2 person hand held assist Transfers: Sit to/from Stand Sit to Stand: Mod assist         General transfer comment: modA x2 for power up and steadying, verbal and tactile cues to come to full standing  Ambulation/Gait Ambulation/Gait assistance: Mod assist;+2 physical assistance Gait Distance (Feet): 2 Feet Assistive device: 2 person hand held assist Gait Pattern/deviations: Decreased stride length;Wide base of support;Step-to pattern;Shuffle Gait velocity: reduced  Gait velocity interpretation: <1.31 ft/sec, indicative of household ambulator General Gait Details: modA x2 for stepping to recliner, requires assist offweight LE to advance other LE, increased time and effort required      Modified Rankin (Stroke Patients Only) Modified Rankin (Stroke Patients Only) Pre-Morbid Rankin Score: Moderate disability Modified Rankin: Moderately severe disability     Balance Overall balance assessment: Needs assistance Sitting-balance support: Feet supported;Single extremity supported Sitting balance-Leahy Scale: Fair Sitting balance - Comments: pt requires min guard assist to maintain EOB sitting    Standing balance support: Single extremity supported Standing balance-Leahy Scale: Poor Standing balance comment: requires mod A for static standing                             Cognition Arousal/Alertness: Awake/alert Behavior During Therapy: WFL for tasks assessed/performed Overall Cognitive Status: Impaired/Different from baseline Area of Impairment: Orientation;Attention;Memory;Following commands;Safety/judgement;Awareness  Orientation Level: Disoriented to;Time;Situation;Place Current Attention Level: Selective;Sustained Memory:  Decreased short-term memory Following Commands: Follows one step commands consistently;Follows multi-step commands inconsistently Safety/Judgement: Decreased awareness of safety;Decreased awareness of deficits Awareness: Intellectual Problem Solving: Slow processing;Decreased initiation;Difficulty sequencing;Requires verbal cues;Requires tactile cues General Comments: Pt reports being in hospital but not able to recall time or reason for being in hospital, pt requires increased time and cuing for initiation and sequencing of tasks       Exercises General Exercises - Lower Extremity Ankle Circles/Pumps: AROM;5 reps;Both;AAROM Heel Slides: AROM;AAROM;10 reps;Supine Straight Leg Raises: AROM;Both;10 reps;Supine;AAROM    General Comments General comments (skin integrity, edema, etc.): VSS      Pertinent Vitals/Pain Pain Assessment: 0-10 Pain Score: 7  Pain Location: chest/ribs  Pain Descriptors / Indicators: Crushing;Aching;Sore Pain Intervention(s): Limited activity within patient's tolerance;Monitored during session;Repositioned           PT Goals (current goals can now be found in the care plan section) Acute Rehab PT Goals Patient Stated Goal: Pt unable to state  PT Goal Formulation: With patient Time For Goal Achievement: 08/18/19 Potential to Achieve Goals: Good Progress towards PT goals: Progressing toward goals    Frequency    Min 3X/week      PT Plan Current plan remains appropriate       AM-PAC PT "6 Clicks" Mobility   Outcome Measure  Help needed turning from your back to your side while in a flat bed without using bedrails?: A Lot Help needed moving from lying on your back to sitting on the side of a flat bed without using bedrails?: A Lot Help needed moving to and from a bed to a chair (including a wheelchair)?: A Lot Help needed standing up from a chair using your arms (e.g., wheelchair or bedside chair)?: A Lot Help needed to walk in hospital room?: A  Lot Help needed climbing 3-5 steps with a railing? : Total 6 Click Score: 11    End of Session Equipment Utilized During Treatment: Gait belt Activity Tolerance: Patient tolerated treatment well Patient left: with call bell/phone within reach;with chair alarm set;in chair Nurse Communication: Mobility status PT Visit Diagnosis: Unsteadiness on feet (R26.81);Muscle weakness (generalized) (M62.81);Difficulty in walking, not elsewhere classified (R26.2) Pain - Right/Left: Left Pain - part of body: Leg;Ankle and joints of foot (chest)     Time: 5573-2202 PT Time Calculation (min) (ACUTE ONLY): 23 min  Charges:  $Therapeutic Exercise: 8-22 mins $Therapeutic Activity: 8-22 mins                     Lorren Splawn B. Migdalia Dk PT, DPT Acute Rehabilitation Services Pager [phone] Office [phone]    [place] 08/08/2019, 11:54 AM

## 2019-08-08 NOTE — Progress Notes (Signed)
Patient oxygen weaned to 1 L [place], sating 96%.  Oxygen saturation dropped to 86- 88% on room air.

## 2019-08-08 NOTE — Progress Notes (Signed)
MD called d/t pt c/o of 9/10 substernal central cp. The pain is non-radiating & is not associated w/ sob. Per pt pain is worse with a deep breath. Upon touching the chest pt states the pain is worse. ekg & vs done & are charted. New orders for stat cxr & iv pain med. Will continue to monitor the pt. Hoover Brunette, RN

## 2019-08-08 NOTE — Consult Note (Addendum)
Physical Medicine and Rehabilitation Consult Reason for Consult: Right side weakness Referring Physician: [facility]   HPI: Christina [name] is a 69 y.o. right-handed female with history of hemodialysis Tuesday Thursday Saturday, hypertension, GI bleed, anemia of chronic disease, hyperlipidemia, left CEA, atrial fibrillation, COPD, CAD with stenting maintained on aspirin and quit smoking 2 years ago polysubstance abuse.  Per chart review patient lives with her daughter independent with ADLs prior to admission.  1 level home with level entry.  She does have a sister in the area.  Presented 08/01/2019 to [facility] in [place] with a pulse and was cardioverted.  Noted hypokalemia greater than 7.  After cardioversion she became asystolic with 10 minutes of CPR.  She was treated with bicarbonate calcium, dextrose and insulin and transferred to [facility].  Outside labs showed WBC of 10.1, hemoglobin 8.6, platelet 107, potassium 6.9, BUN 50, creatinine 5.30.  Initial cranial CT scan showed trace acute subdural hematoma along the falx but no other acute intracranial hemorrhage or mass-effect.  Patient was intubated and while under the care of pulmonary critical care developed right side weakness.  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  Echocardiogram with ejection fraction of 60% no wall motion abnormalities.  Patient currently remains on aspirin therapy Plavix held due to SDH.  Plan to repeat cranial CT scan 08/11/2019 if stable then plan to begin Plavix.  Amiodarone and Coreg were added to her regimen with monitoring of blood pressure.  Hemodialysis ongoing as per renal services.  Findings of acute to subacute multiple DVTs right lower extremity no anticoagulation due to subdural hematoma as well as history of GI bleed and received IVC filter 08/05/2019 per interventional  radiology.  Placed on Maxipime 08/06/2019 for possible aspiration pneumonia.  Patient is currently tolerating a regular consistency diet.  Therapy evaluations completed with recommendations of physical medicine rehab consult.   Review of Systems  Constitutional: Negative for chills and fever.  HENT: Negative for hearing loss.   Eyes: Negative for blurred vision and double vision.  Respiratory: Positive for shortness of breath. Negative for cough.   Cardiovascular: Positive for palpitations and leg swelling. Negative for chest pain.  Gastrointestinal: Positive for constipation. Negative for heartburn, nausea and vomiting.  Genitourinary: Negative for dysuria, flank pain and hematuria.  Musculoskeletal: Positive for myalgias.  Skin: Negative for rash.  Neurological: Positive for weakness and headaches.  Psychiatric/Behavioral: Positive for depression. The patient has insomnia.        Anxiety  All other systems reviewed and are negative.  Past Medical History:  Diagnosis Date  . Anemia   . Anxiety   . Arthritis   . Asthma   . Atrial fibrillation ([place])   . Chronic kidney disease    Dialysis T/Th/Sa  started in March 2020  . COPD (chronic obstructive pulmonary disease) ([place])   . Coronary artery disease    2 stents  . Depression   . GERD (gastroesophageal reflux disease)   . GI bleeding 06/15/2019  . Gout   . Headache    migraines  . History of kidney stones   . Hyperlipidemia   . Hypertension   . Pneumonia   . Small bowel obstruction [facility])    Past Surgical History:  Procedure Laterality Date  . ABDOMINAL AORTOGRAM W/LOWER EXTREMITY Bilateral 07/08/2019   Procedure: ABDOMINAL AORTOGRAM W/LOWER EXTREMITY;  Surgeon: Serafina Mitchell, MD;  Location: [place] [facility];  Service: Vascular;  Laterality: Bilateral;  . ABDOMINAL HYSTERECTOMY    . ABDOMINAL SURGERY     for small bowel obstruction - x 2  . APPENDECTOMY    . AV FISTULA PLACEMENT Left 08/04/2018   Procedure:  ARTERIOVENOUS (AV) FISTULA CREATION LEFT ARM;  Surgeon: Waynetta Sandy, MD;  Location: [place];  Service: Vascular;  Laterality: Left;  . BASCILIC VEIN TRANSPOSITION Left 11/24/2018   Procedure: SECOND STAGE BASILIC VEIN TRANSPOSITION LEFT ARM;  Surgeon: Waynetta Sandy, MD;  Location: [place];  Service: Vascular;  Laterality: Left;  . BIOPSY  03/26/2019   Procedure: BIOPSY;  Surgeon: Lavena Bullion, DO;  Location: [facility] ENDOSCOPY;  Service: Gastroenterology;;  . BIOPSY  07/05/2019   Procedure: BIOPSY;  Surgeon: Ladene Artist, MD;  Location: [facility] ENDOSCOPY;  Service: Endoscopy;;  . CARDIAC CATHETERIZATION    . CORONARY ANGIOPLASTY  ?2003/2004  . ESOPHAGOGASTRODUODENOSCOPY N/A 06/16/2019   Procedure: ESOPHAGOGASTRODUODENOSCOPY (EGD);  Surgeon: [name], MD;  Location: [facility] ENDOSCOPY;  Service: Endoscopy;  Laterality: N/A;  . ESOPHAGOGASTRODUODENOSCOPY (EGD) WITH PROPOFOL N/A 03/26/2019   Procedure: ESOPHAGOGASTRODUODENOSCOPY (EGD) WITH PROPOFOL;  Surgeon: Lavena Bullion, DO;  Location: [place];  Service: Gastroenterology;  Laterality: N/A;  . ESOPHAGOGASTRODUODENOSCOPY (EGD) WITH PROPOFOL N/A 07/05/2019   Procedure: ESOPHAGOGASTRODUODENOSCOPY (EGD) WITH PROPOFOL;  Surgeon: Ladene Artist, MD;  Location: [facility] ENDOSCOPY;  Service: Endoscopy;  Laterality: N/A;  . FACIAL RECONSTRUCTION SURGERY     x 2  . HERNIA REPAIR    . INTRAVASCULAR LITHOTRIPSY Right 07/12/2019   Procedure: INTRAVASCULAR LITHOTRIPSY;  Surgeon: Serafina Mitchell, MD;  Location: [place] [facility];  Service: Cardiovascular;  Laterality: Right;  sfa  . IR IVC FILTER PLMT / S&I /IMG GUID/MOD SED  08/05/2019  . IR REMOVAL TUN CV CATH W/O FL  07/14/2019  . PERIPHERAL VASCULAR BALLOON ANGIOPLASTY Right 07/12/2019   Procedure: PERIPHERAL VASCULAR BALLOON ANGIOPLASTY;  Surgeon: Serafina Mitchell, MD;  Location: [place] [facility];  Service: Cardiovascular;  Laterality: Right;  sfa  . PERIPHERAL VASCULAR INTERVENTION   04/27/2019   Procedure: PERIPHERAL VASCULAR INTERVENTION;  Surgeon: Marty Heck, MD;  Location: [place] [facility];  Service: Cardiovascular;;  SMA  . PERIPHERAL VASCULAR INTERVENTION Left 07/08/2019   Procedure: PERIPHERAL VASCULAR INTERVENTION;  Surgeon: Serafina Mitchell, MD;  Location: [place] [facility];  Service: Vascular;  Laterality: Left;  left common and external iliac  . VISCERAL ANGIOGRAPHY N/A 04/27/2019   Procedure: MESENTERIC ANGIOGRAPHY;  Surgeon: Marty Heck, MD;  Location: [place] [facility];  Service: Cardiovascular;  Laterality: N/A;   Family History  Problem Relation Age of Onset  . Ulcers Sister        sick a long time, improved after surgery   Social History:  reports that she quit smoking about 2 years ago. She has a 25.00 pack-year smoking history. She has never used smokeless tobacco. She reports previous alcohol use. She reports previous drug use. Drug: Marijuana. Allergies:  Allergies  Allergen Reactions  . Betaine Anaphylaxis  . Cyclobenzaprine Anaphylaxis and Other (See Comments)    "stopped heart"   . Morphine Anaphylaxis and Other (See Comments)    "stopped heart"  . Penicillins Shortness Of Breath, Swelling and Palpitations    Did it involve swelling of the face/tongue/throat, SOB, or low BP? Yes Did it involve sudden or severe rash/hives, skin peeling, or any reaction on the inside of your mouth or nose? No  Did you need to seek medical attention at a hospital or doctor's office? No When did it last happen?years  If all above answers are "NO", may proceed with cephalosporin use.   . Ambien [Zolpidem] Other (See Comments)    Severe confusion  . Codeine Itching and Rash  . Hydromorphone Other (See Comments)    If administered quickly, felt like hand was "exploding"    Medications Prior to Admission  Medication Sig Dispense Refill  . albuterol (PROVENTIL) (2.5 MG/3ML) 0.083% nebulizer solution Take 2.5 mg by nebulization every 6  (six) hours as needed for wheezing or shortness of breath.    . allopurinol (ZYLOPRIM) 100 MG tablet Take 100 mg by mouth daily.    Marland Kitchen amiodarone (PACERONE) 200 MG tablet Take 1 tablet (200 mg total) by mouth 2 (two) times daily. 60 tablet 4  . aspirin 81 MG EC tablet Take 81 mg by mouth daily.    . budesonide-formoterol (SYMBICORT) 160-4.5 MCG/ACT inhaler Inhale 2 puffs into the lungs every 4 (four) hours as needed ("for shortness of breath or wheezing").     . Cholecalciferol (VITAMIN D3) 50 MCG (2000 UT) TABS Take 4,000 Units by mouth in the morning.    . clopidogrel (PLAVIX) 75 MG tablet Take 75 mg by mouth daily.    Marland Kitchen dicyclomine (BENTYL) 10 MG capsule Take 1 capsule (10 mg total) by mouth at bedtime as needed for spasms. (Patient taking differently: Take 10 mg by mouth daily as needed for spasms. )    . diphenhydrAMINE (BENADRYL) 25 MG tablet Take 25 mg by mouth at bedtime as needed for sleep.     Marland Kitchen docusate sodium (COLACE) 100 MG capsule Take 1 capsule (100 mg total) by mouth 2 (two) times daily. (Patient taking differently: Take 300 mg by mouth in the morning and at bedtime. ) 30 capsule 0  . ferric citrate (AURYXIA) 1 GM 210 MG(Fe) tablet Take 420 mg by mouth 3 (three) times daily with meals.     . Ipratropium-Albuterol (COMBIVENT) 20-100 MCG/ACT AERS respimat Inhale 2 puffs into the lungs 4 (four) times daily as needed for wheezing or shortness of breath.     . isosorbide mononitrate (IMDUR) 30 MG 24 hr tablet Take 30 mg by mouth at bedtime.     . lidocaine-prilocaine (EMLA) cream Apply 1 application topically daily as needed (pain at the site of port access).     . metoprolol tartrate (LOPRESSOR) 100 MG tablet Take 100 mg by mouth 2 (two) times daily.    . nitroGLYCERIN (NITROSTAT) 0.4 MG SL tablet Place 0.4 mg under the tongue every 5 (five) minutes as needed for chest pain.     Marland Kitchen ondansetron (ZOFRAN-ODT) 4 MG disintegrating tablet Take 4 mg by mouth every 8 (eight) hours as needed for  nausea (DISSOLVE ORALLY).    Marland Kitchen pantoprazole (PROTONIX) 40 MG tablet Take 1 tablet (40 mg total) by mouth 2 (two) times daily. (Patient taking differently: Take 40 mg by mouth 2 (two) times daily before a meal. ) 60 tablet 6  . polyethylene glycol (MIRALAX / GLYCOLAX) 17 g packet Take 17 g by mouth daily as needed for mild constipation. 14 each 0  . pravastatin (PRAVACHOL) 80 MG tablet Take 80 mg by mouth every evening.     . sennosides-docusate sodium (SENOKOT-S) 8.6-50 MG tablet Take 1 tablet by mouth every 8 (eight) hours as needed for constipation.     . metoprolol tartrate (LOPRESSOR) 50 MG tablet Take 1 tablet (50  mg total) by mouth 2 (two) times daily.    . Nutritional Supplements (FEEDING SUPPLEMENT, NEPRO CARB STEADY,) LIQD Take 237 mLs by mouth 2 (two) times daily between meals. (Patient not taking: Reported on 08/01/2019)  0    Home: Home Living Family/patient expects to be discharged to:: Private residence Living Arrangements: Children Available Help at Discharge: Family, Available 24 hours/day Type of Home: Apartment Home Access: Level entry Home Layout: One level Bathroom Shower/Tub: Multimedia programmer: Handicapped height Home Equipment: Environmental consultant - 4 wheels Additional Comments: Pt reports she lives with her daughter  Functional History: Prior Function Level of Independence: Needs assistance Gait / Transfers Assistance Needed: Pt reports she ambulates without AD  ADL's / Homemaking Assistance Needed: Reports independent with ADLs Comments: Pt reports she, nor her daughter drive. Her sister provides some transportation, but often misses HD due to lack of transportation (per her report) Functional Status:  Mobility: Bed Mobility Overal bed mobility: Needs Assistance Bed Mobility: Supine to Sit, Sit to Supine Supine to sit: Mod assist Sit to supine: Mod assist General bed mobility comments: max verbal and tactile cues to initiate, motor plan, and sequence the  movement  Transfers Overall transfer level: Needs assistance Equipment used: 1 person hand held assist Transfers: Sit to/from Stand Sit to Stand: Mod assist Stand pivot transfers: Mod assist General transfer comment: Pt requires mod A to power up into standing, and mod A shift weight and advance Rt LE to side step up the EOB  Ambulation/Gait General Gait Details: unable    ADL: ADL Overall ADL's : Needs assistance/impaired Eating/Feeding: Minimal assistance, Sitting, Bed level Grooming: Brushing hair, Total assistance, Sitting Grooming Details (indicate cue type and reason): Pt unable to orient comb correctly in her hand or to her had to effectively comb hair despite max hand over hand assist  Upper Body Bathing: Moderate assistance, Sitting Lower Body Bathing: Maximal assistance, Sit to/from stand Upper Body Dressing : Maximal assistance, Sitting Lower Body Dressing: Total assistance, Sit to/from stand Toilet Transfer: Moderate assistance, Stand-pivot, BSC Toileting- Clothing Manipulation and Hygiene: Total assistance, Sit to/from stand Functional mobility during ADLs: Moderate assistance  Cognition: Cognition Overall Cognitive Status: Impaired/Different from baseline Orientation Level: Oriented to person, Oriented to place, Oriented to time, Disoriented to situation Cognition Arousal/Alertness: Awake/alert Behavior During Therapy: WFL for tasks assessed/performed Overall Cognitive Status: Impaired/Different from baseline Area of Impairment: Orientation, Attention, Memory, Following commands, Safety/judgement, Awareness Orientation Level: Disoriented to, Time, Situation, Place Current Attention Level: Selective, Sustained Memory: Decreased short-term memory Following Commands: Follows one step commands consistently, Follows multi-step commands inconsistently Safety/Judgement: Decreased awareness of safety, Decreased awareness of deficits Awareness: Intellectual Problem  Solving: Slow processing, Decreased initiation, Difficulty sequencing, Requires verbal cues, Requires tactile cues General Comments: Pt is unaware of what city she is in.  She reports she is in the hospital because she fell out of bed. She requires mod A/cues to initiate activity.  She was lying on a bedpan with no awareness of such, and had no awareness that the purpose of a bedpan is to void into   Blood pressure (!) 176/85, pulse 69, temperature 97.8 F (36.6 C), temperature source Oral, resp. rate 16, height 5\' 2"  (1.575 m), weight 49.9 kg, SpO2 96 %. General: Alert and oriented x 2, No apparent distress HEENT: Head is normocephalic, atraumatic, PERRLA, EOMI, sclera anicteric, oral mucosa pink and moist, poor dentition  Neck: Supple without JVD or lymphadenopathy Heart: Reg rate and rhythm. No murmurs rubs or gallops Chest:  CTA bilaterally without wheezes, rales, or rhonchi; no distress Abdomen: Soft, non-tender, non-distended, bowel sounds positive. Extremities: No clubbing, cyanosis, or edema. Pulses are 2+ Skin: Clean and intact without signs of breakdown Neuro: Patient is lethargic but arousable.  Makes eye contact with examiner.  Mild dysarthria but intelligible.  She provides place and age but needed cues for time.  Follows simple commands. Tremor present at rest BUE 3/5 throughout, BLE 3/5 proximally and 4-/5 distally.  Psych: Pt's affect is appropriate. Pt is cooperative   Results for orders placed or performed during the hospital encounter of 08/01/19 (from the past 24 hour(s))  Glucose, capillary     Status: None   Collection Time: 08/07/19  8:29 AM  Result Value Ref Range   Glucose-Capillary 84 70 - 99 mg/dL   Comment 1 Notify RN    Comment 2 Document in Chart   Glucose, capillary     Status: None   Collection Time: 08/07/19 12:31 PM  Result Value Ref Range   Glucose-Capillary 92 70 - 99 mg/dL   Comment 1 Notify RN    Comment 2 Document in Chart   Glucose, capillary      Status: Abnormal   Collection Time: 08/07/19  4:12 PM  Result Value Ref Range   Glucose-Capillary 178 (H) 70 - 99 mg/dL   Comment 1 Notify RN    Comment 2 Document in Chart    DG CHEST PORT 1 VIEW  Result Date: 08/08/2019 CLINICAL DATA:  69 year old female with chest pain. EXAM: PORTABLE CHEST 1 VIEW COMPARISON:  Chest radiograph dated 08/06/2019. FINDINGS: Left subclavian central venous line with tip close to the cavoatrial junction similar to prior radiograph. Small bilateral pleural effusions, right greater than left with bibasilar atelectasis or infiltrate. Slight improvement in aeration of the lungs and edema compared to prior radiograph. No pneumothorax. Stable cardiomegaly. Coronary vascular calcification and stent. Atherosclerotic calcification of the aorta. No acute osseous pathology. Osteopenia. IMPRESSION: Small bilateral pleural effusions with bibasilar atelectasis or infiltrate. Slightly improved aeration of the lungs. Electronically Signed   By: Anner Crete M.D.   On: 08/08/2019 02:33   DG CHEST PORT 1 VIEW  Result Date: 08/06/2019 CLINICAL DATA:  Follow-up pleural effusion EXAM: PORTABLE CHEST 1 VIEW COMPARISON:  Four days ago FINDINGS: Extubation of the trachea and esophagus. Left subclavian line with tip in good position. Interstitial opacity and probable layering pleural fluid which is increased. There is also hazy airspace disease. No pneumothorax. Cardiomegaly and vascular pedicle widening. IMPRESSION: Edema and pleural effusions which have progressed from 4 days ago. Electronically Signed   By: Monte Fantasia M.D.   On: 08/06/2019 08:56   Assessment/Plan: Diagnosis: Debility following cardiac arrest 1. Does the need for close, 24 hr/day medical supervision in concert with the patient's rehab needs make it unreasonable for this patient to be served in a less intensive setting? Yes 2. Co-Morbidities requiring supervision/potential complications: respiratory failure,  hyperkalemia, CAD s/p stent, history of atrial fibrillation, ESRD, acute metabolic encephalopathy, recent subdural hematoma on CT scan, anemia, hypoglycemia, HTN 3. Due to bladder management, bowel management, safety, skin/wound care, disease management, medication administration, pain management and patient education, does the patient require 24 hr/day rehab nursing? Yes 4. Does the patient require coordinated care of a physician, rehab nurse, therapy disciplines of PT, OT, SLP to address physical and functional deficits in the context of the above medical diagnosis(es)? Yes Addressing deficits in the following areas: balance, endurance, locomotion, strength, transferring, bowel/bladder control, bathing, dressing,  feeding, grooming, toileting and psychosocial support, speech 5. Can the patient actively participate in an intensive therapy program of at least 3 hrs of therapy per day at least 5 days per week? Yes 6. The potential for patient to make measurable gains while on inpatient rehab is excellent 7. Anticipated functional outcomes upon discharge from inpatient rehab are supervision  with PT, supervision with OT, supervision with SLP. 8. Estimated rehab length of stay to reach the above functional goals is: 14-18 days 9. Anticipated discharge destination: Home 10. Overall Rehab/Functional Prognosis: excellent  RECOMMENDATIONS: This patient's condition is appropriate for continued rehabilitative care in the following setting: CIR Patient has agreed to participate in recommended program. Yes Note that insurance prior authorization may be required for reimbursement for recommended care.  Comment: Mrs. Celaya would be a good CIR candidate. She states that she lives with her daughter who will be home with her. She does report that she feels lonely, and was agreeable to a chaplain consult- I will place order. Thank you for this consult. We will continue to follow in Mrs. Grimley's care.   Lavon Paganini  Angiulli, PA-C 08/08/2019   I have personally performed a face to face diagnostic evaluation, including, but not limited to relevant history and physical exam findings, of this patient and developed relevant assessment and plan.  Additionally, I have reviewed and concur with the physician assistant's documentation above.  Leeroy Cha, MD

## 2019-08-08 NOTE — Progress Notes (Signed)
Patient was seen for chest pain. She reports developing pain in the central chest tonight, non-radiating, and not associated with SOB or nausea.   Pain is worth with deep breath and with palpation.   She has IVC filter in place, denies SOB, and does not have any change in RR or oxygenation.   EKG reviewed.   Description and exam most consistent with MSK etiology. Will check CXR, treat pain, and continue close monitoring. Discussed with RN at bedside.

## 2019-08-08 NOTE — Progress Notes (Signed)
Inpatient Rehab Admissions Coordinator:   Met with patient at bedside to discuss potential CIR admission. Pt. Stated interest. I called and left a voicemail with Pt.'s daughter with request for callback. Will pursue for potential admit this week, pending bed availability.    Clemens Catholic, [place], [place] Admissions Coordinator  [phone] ([place]) [phone] (office)

## 2019-08-08 NOTE — Progress Notes (Signed)
PROGRESS NOTE                                                                                                                                                                                                             Patient Demographics:    Christina [name], is a 69 y.o. female, DOB - 1950/02/11, WYO:[id number]  Admit date - 08/01/2019   Admitting Physician Garner Nash, DO  Outpatient Primary MD for the patient is Nodal, [name], PA-C  LOS - 7  CC - Passed out     Brief Narrative - 69 year old Caucasian female with history of ESRD with noncompliance with dialysis on multiple occasions, CAD s/p 2 stents in the past, SMA stent in May 2021, left carotid endarterectomy in the past, hypertension, dyslipidemia, alcohol abuse, GI bleed, COPD, GERD, history of duodenal ulcers found on EGD in June 2021, anemia of chronic disease, medication noncompliance who was brought to [facility] ER after she had an asystolic arrest at home with 10-minute downtime requiring CPR thought to be due to hyperkalemia.  She was subsequently intubated and transferred to [facility] under pulmonary critical care, while under the care of pulmonary critical care she developed right-sided weakness arm more than leg on 08/04/2019.  Imaging studies of the brain showed stable small subdural hematoma, she was stabilized extubated and transferred to my service on 08/05/2019 on day 4 of her hospital stay.   Subjective:   patient in bed more awake and alert today having breakfast, right arm weakness is improving, currently denies any headache, no chest or abdominal pain overall feels much better.   Assessment  & Plan :     1.  Cardiac arrest with subsequent respiratory failure due to unresponsive state caused by hyperkalemia due to noncompliance with dialysis.  She has been dialyzed, potassium stable, has been extensively counseled on compliance, also discussed this with her [name] over the phone on  08/05/2019.  Echo shows stable EF of around 55% with no regional wall motion abnormality.  Continue monitoring with supportive care.  2.  CAD s/p stent in the remote past, history of atrial fibrillation, PAD with history of SMA stent, left carotid endarterectomy.  Will place her on statin and Coreg for now for secondary prevention, currently on baby aspirin only Plavix held due to recent subdural hematoma, see #5 below.  3.  ESRD.  TTS schedule.  Nephrology on board.  Counseled on compliance.  4.  Acute metabolic encephalopathy post cardiac arrest.  Worse this morning after an episode of hypoglycemia last night, supportive care and monitor.  5.  Recent subdural hematoma on CT scan, new right arm weakness x 2 days.  Neurology consulted, MRI brain along with CT angiogram head and neck ordered.  Statin for secondary prevention, baby aspirin only, discussed the case with neurology Dr. Erlinda Hong on 08/06/2019, per neurology repeat head CT on 08/11/2019.  If stable subdural bleed then resume Plavix.  6.  Essential hypertension.  Placed on Coreg.  Will monitor and adjust.  Once ischemic infarct is ruled out will be more aggressive with blood pressure control.  7.  Anemia and chronic thrombocytopenia.  Also has history of duodenal GI bleed in the past, likely has alcoholic cirrhosis as well.  Will transfuse 1 unit of packed RBC on 08/06/2019 due to symptomatic anemia.  Continue to monitor closely.  Continue PPI.  8.  Severe PCM.  Supportive care with nutritional supplements.  9.  History of paroxysmal A. fib.  On beta-blocker.  Severely noncompliant with medications.  Mali vas 2 score of greater than 4.  Not on anticoagulation prior to this admission likely due to severe noncompliance along with history of recent upper GI bleed.  Currently has new SDH as well.  10.  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  Cannot fully anticoagulate due to recent subdural hematoma and recent GI bleed.  Also  very noncompliant with medications.  Received IVC filter by IR on 08/05/2019.  11.  Stress related hyperglycemia.  No DM type II.  Extremely brittle sugar control, A1c is 4.9, multiple drops in blood sugar hands have stopped sliding scale for now.  12.  Possible aspiration pneumonia on 08/06/2019 due to encephalopathy.  Empirically placed on Maxipime + Flagyl x 4 days total and monitor stop date 08/10/2019..  Will also request RT for deep NT suction.  Soft diet with feeding assistance and aspiration precautions.  Keep head of the bed at 35 degrees and monitor.   Overall prognosis is extremely poor due to multiple above-mentioned comorbidities and longstanding history of noncompliance.  Daughter was clearly explained on 08/05/2019 over the phone she understands this.  If any further decline we will transition towards comfort.  For now DNR.  Recent Labs  Lab 08/01/19 1208 08/01/19 1400 08/01/19 1504 08/02/19 0015 08/04/19 0331 08/05/19 0500 08/06/19 0540 08/06/19 1010 08/07/19 0333 08/08/19 0541  WBC 9.1   < >  --    < > 14.9* 17.1* 10.3  --  7.9 7.6  PLT 125*   < >  --    < > 120* 129* 133*  --  94* 111*  CRP  --   --   --   --   --   --   --   --  6.0* 8.6*  PROCALCITON 0.68  --   --   --   --   --   --  1.45 1.35  --   LATICACIDVEN 9.8*  --  1.9  --   --   --   --   --   --   --    < > = values in this interval not displayed.       Condition - Extremely Guarded  Family Communication  :  [name] [phone] on 08/05/2019  Code Status :  DNR  Consults  :  PCCM, Renal, Neuro  Procedures  :    CT Head 7/5 >> trace acute subdural hematoma along  the falx, no other acute intracranial findings.  No CT evidence of anoxic injury  ECHO 7/5 >> LVEF 55-60%, no RWMA, interventricular septum is mildly flattened in systole & diastole consistent with RV & volume overload, RV systolic function is mildly reduced.  Severely elevated pulmonary artery systolic pressure, estimated RV systolic  pressure is 16.1.  LA mildly dilated, mild to moderate TR  CT Head 7/6 & 7/7 >> stable trace parafalcine subdural hematoma, advanced chronic ischemic injury  ETT 7/5 >> 7/7   R Fem TLC 7/5 >> 7/5  L IJ CL 7/5 >>  Venous ultrasound.  Multiple right leg DVTs.  Likely subacute.    MRI brain - Numerous small acute infarcts involving multiple vascular territories bilaterally suggesting a central embolic etiology.  Advanced chronic microvascular ischemic changes and multiple chronic infarcts.   CT angiogram head and neck. No substantial change since 06/19/2019. There is irregular noncalcified plaque along the aortic arch. Stable findings of stenosis including left common carotid artery, intracranial right vertebral artery, intracranial ICAs, and left A2 ACA. Persistent pleural effusions and associated atelectasis.  IVC filter placement on 08/05/2019.   PUD Prophylaxis : PPI  Disposition Plan  :    Status is: Inpatient  Remains inpatient appropriate because:Unsafe d/c plan, work-up for CVA.   Dispo: The patient is from: Home              Anticipated d/c is to: SNF              Anticipated d/c date is: > 3 days              Patient currently is not medically stable to d/c.  DVT Prophylaxis  :    SCDs    Lab Results  Component Value Date   PLT 111 (L) 08/08/2019    Diet :  Diet Order            Diet renal with fluid restriction Fluid restriction: 1200 mL Fluid; Room service appropriate? Yes; Fluid consistency: Thin  Diet effective now                  Inpatient Medications Scheduled Meds:  sodium chloride   Intravenous Once   amiodarone  200 mg Oral BID   amLODipine  10 mg Oral Daily   arformoterol  15 mcg Nebulization BID   aspirin  81 mg Oral Daily   atorvastatin  40 mg Oral Daily   budesonide (PULMICORT) nebulizer solution  0.5 mg Nebulization BID   carvedilol  6.25 mg Oral BID WC   chlorhexidine gluconate (MEDLINE KIT)  15 mL Mouth Rinse BID    Chlorhexidine Gluconate Cloth  6 each Topical Q0600   Chlorhexidine Gluconate Cloth  6 each Topical Q0600   Chlorhexidine Gluconate Cloth  6 each Topical Q0600   [START ON 08/09/2019] darbepoetin (ARANESP) injection - DIALYSIS  60 mcg Intravenous Q Tue-HD   feeding supplement (NEPRO CARB STEADY)  237 mL Oral TID BM   gabapentin  300 mg Oral BID   mouth rinse  15 mL Mouth Rinse BID   multivitamin  1 tablet Oral QHS   neomycin-bacitracin-polymyxin   Topical BID   pantoprazole  40 mg Oral BID   sodium chloride flush  10-40 mL Intracatheter Q12H   Continuous Infusions:  [START ON 08/09/2019] ceFEPime (MAXIPIME) IV     metronidazole 500 mg (08/08/19 0905)   PRN Meds:.acetaminophen, albuterol, docusate sodium, guaiFENesin, hydrALAZINE, HYDROcodone-acetaminophen, midodrine, ondansetron (ZOFRAN) IV, polyethylene glycol, traMADol  Antibiotics  :   Anti-infectives (From admission, onward)   Start     Dose/Rate Route Frequency Ordered Stop   08/09/19 1800  ceFEPIme (MAXIPIME) 2 g in sodium chloride 0.9 % 100 mL IVPB     Discontinue     2 g 200 mL/hr over 30 Minutes Intravenous Every T-Th-Sa (Hemodialysis) 08/07/19 1054 08/13/19 1759   08/06/19 1800  ceFEPIme (MAXIPIME) 2 g in sodium chloride 0.9 % 100 mL IVPB  Status:  Discontinued        2 g 200 mL/hr over 30 Minutes Intravenous Every T-Th-Sa (Hemodialysis) 08/06/19 1034 08/07/19 1054   08/06/19 1200  metroNIDAZOLE (FLAGYL) IVPB 500 mg     Discontinue     500 mg 100 mL/hr over 60 Minutes Intravenous Every 8 hours 08/06/19 1034            Objective:   Vitals:   08/08/19 0430 08/08/19 0738 08/08/19 0741 08/08/19 0819  BP: (!) 176/85  (!) 189/64 (!) 156/76  Pulse: 69  70   Resp: 16  20   Temp: 97.8 F (36.6 C)  (!) 97.2 F (36.2 C)   TempSrc: Oral  Oral   SpO2: 96% 99% 99%   Weight: 49.9 kg     Height:        SpO2: 99 % O2 Flow Rate (L/min): 2 L/min FiO2 (%): 30 %  Wt Readings from Last 3 Encounters:  08/08/19  49.9 kg  07/14/19 40.5 kg  06/20/19 50.8 kg     Intake/Output Summary (Last 24 hours) at 08/08/2019 1028 Last data filed at 08/07/2019 2145 Gross per 24 hour  Intake 360 ml  Output 200 ml  Net 160 ml     Physical Exam  Awake Alert, R arm 3/5 [place].AT,PERRAL Supple Neck,No JVD, No cervical lymphadenopathy appriciated.  Symmetrical Chest wall movement, Good air movement bilaterally, CTAB RRR,No Gallops, Rubs or new Murmurs, No Parasternal Heave +ve B.Sounds, Abd Soft, No tenderness, No organomegaly appriciated, No rebound - guarding or rigidity. Cyanosis in both feet with multiple toes cyanosed and has a chronic multiple dry black toe tips, large bruise anterior part of the right middle leg with bndage covering a skin tear ( came with it)    Data Review:    Recent Labs  Lab 08/01/19 1208 08/01/19 1211 08/04/19 0331 08/05/19 0500 08/06/19 0540 08/07/19 0333 08/08/19 0541  WBC 9.1   < > 14.9* 17.1* 10.3 7.9 7.6  HGB 8.5*   < > 8.0* 7.7* 7.3* 8.8* 9.6*  HCT 28.5*   < > 27.0* 25.7* 25.1* 28.7* 30.8*  PLT 125*   < > 120* 129* 133* 94* 111*  MCV 96.3   < > 100.0 98.5 98.8 92.9 94.2  MCH 28.7   < > 29.6 29.5 28.7 28.5 29.4  MCHC 29.8*   < > 29.6* 30.0 29.1* 30.7 31.2  RDW 17.2*   < > 18.3* 19.0* 19.2* 20.7* 19.5*  LYMPHSABS 0.2*  --   --   --   --  0.6* 0.4*  MONOABS 0.1  --   --   --   --  0.7 0.7  EOSABS 0.0  --   --   --   --  0.4 0.3  BASOSABS 0.0  --   --   --   --  0.0 0.0   < > = values in this interval not displayed.    Recent Labs  Lab  0000 08/01/19 1208 08/01/19 1211 08/01/19 1400 08/02/19 0015 08/02/19  1845 08/03/19 0346 08/03/19 1621 08/03/19 2033 08/04/19 0331 08/05/19 0500 08/06/19 0540 08/06/19 1010 08/07/19 0333 08/08/19 0541 08/08/19 0542  NA   < > 149*   < > 138   < >  --  141  --   --  139 137 132*  --  136 133*  --   K  --  3.9   < > 3.5   < >  --  3.3*  --    < > 3.8 4.9 4.7  --  4.4 4.6  --   CL   < > 91*   < > 99   < >  --  94*  --    --  99 96* 93*  --  98 97*  --   CO2   < > 34*   < > 30   < >  --  34*  --   --  '28 30 28  ' --  27 24  --   GLUCOSE   < > 297*   < > 64*   < >  --  166*  --   --  182* 124* 289*  --  138* 175*  --   BUN   < > 50*   < > 11   < >  --  43*  --   --  21 39* 52*  --  37* 54*  --   CREATININE   < > 6.34*   < > 1.72*   < >  --  4.41*  --   --  2.87* 4.16* 5.52*  --  4.01* 5.29*  --   CALCIUM   < > 8.5*   < > 6.9*   < >  --  7.1*  --   --  8.0* 8.6* 7.6*  --  7.4* 8.1*  --   AST  --  1,044*  --   --   --   --  305*  --   --   --  62* 35  --   --   --   --   ALT  --  561*  --   --   --   --  446*  --   --   --  222* 145*  --   --   --   --   ALKPHOS  --  79  --   --   --   --  73  --   --   --  86 75  --   --   --   --   BILITOT  --  1.6*  --   --   --   --  0.8  --   --   --  1.0 1.1  --   --   --   --   ALBUMIN  --  2.0*  --  1.9*  --   --  1.7*  --   --   --  2.1* 2.0*  --   --   --   --   MG  --  3.3*  --   --    < > 1.8 1.8 1.7  --   --   --   --   --  1.6* 2.3  --   CRP  --   --   --   --   --   --   --   --   --   --   --   --   --  6.0* 8.6*  --   PROCALCITON  --  0.68  --   --   --   --   --   --   --   --   --   --  1.45 1.35  --   --   INR  --  1.8*  --   --   --   --   --   --   --   --   --   --   --   --   --   --   BNP  --  >4,500.0*  --   --   --   --   --   --   --   --   --   --   --  3,410.4*  --  2,308.3*   < > = values in this interval not displayed.    Recent Labs  Lab 08/01/19 1208 08/06/19 1010 08/07/19 0333 08/08/19 0541 08/08/19 0542  CRP  --   --  6.0* 8.6*  --   BNP >4,500.0*  --  3,410.4*  --  2,308.3*  PROCALCITON 0.68 1.45 1.35  --   --   SARSCOV2NAA NEGATIVE  --   --   --   --     ------------------------------------------------------------------------------------------------------------------ No results for input(s): CHOL, HDL, LDLCALC, TRIG, CHOLHDL, LDLDIRECT in the last 72 hours.  Lab Results  Component Value Date   HGBA1C 4.9 06/19/2019    ------------------------------------------------------------------------------------------------------------------ No results for input(s): TSH, T4TOTAL, T3FREE, THYROIDAB in the last 72 hours.  Invalid input(s): FREET3 ------------------------------------------------------------------------------------------------------------------ No results for input(s): VITAMINB12, FOLATE, FERRITIN, TIBC, IRON, RETICCTPCT in the last 72 hours.  Coagulation profile Recent Labs  Lab 08/01/19 1208  INR 1.8*    No results for input(s): DDIMER in the last 72 hours.  Cardiac Enzymes No results for input(s): CKMB, TROPONINI, MYOGLOBIN in the last 168 hours.  Invalid input(s): CK ------------------------------------------------------------------------------------------------------------------    Component Value Date/Time   BNP 2,308.3 (H) 08/08/2019 0542    Micro Results Recent Results (from the past 240 hour(s))  SARS Coronavirus 2 by RT PCR (hospital order, performed in [facility] hospital lab) Nasopharyngeal Nasopharyngeal Swab     Status: None   Collection Time: 08/01/19 12:08 PM   Specimen: Nasopharyngeal Swab  Result Value Ref Range Status   SARS Coronavirus 2 NEGATIVE NEGATIVE Final    Comment: (NOTE) SARS-CoV-2 target nucleic acids are NOT DETECTED.  The SARS-CoV-2 RNA is generally detectable in upper and lower respiratory specimens during the acute phase of infection. The lowest concentration of SARS-CoV-2 viral copies this assay can detect is 250 copies / mL. A negative result does not preclude SARS-CoV-2 infection and should not be used as the sole basis for treatment or other patient management decisions.  A negative result may occur with improper specimen collection / handling, submission of specimen other than nasopharyngeal swab, presence of viral mutation(s) within the areas targeted by this assay, and inadequate number of viral copies (<250 copies / mL). A negative  result must be combined with clinical observations, patient history, and epidemiological information.  Fact Sheet for Patients:   StrictlyIdeas.no  Fact Sheet for Healthcare Providers: BankingDealers.co.za  This test is not yet approved or  cleared by the [place] FDA and has been authorized for detection and/or diagnosis of SARS-CoV-2 by FDA under an Emergency Use Authorization (EUA).  This EUA will remain in effect (meaning this test can be used) for the duration of the COVID-19  declaration under Section 564(b)(1) of the Act, 21 U.S.C. section 360bbb-3(b)(1), unless the authorization is terminated or revoked sooner.  Performed at [facility], [place] [address] [place], [place] [address]   Culture, blood (Routine X 2) w Reflex to ID Panel     Status: None   Collection Time: 08/01/19 12:44 PM   Specimen: BLOOD RIGHT ARM  Result Value Ref Range Status   Specimen Description BLOOD RIGHT ARM  Final   Special Requests   Final    BOTTLES DRAWN AEROBIC ONLY Blood Culture results may not be optimal due to an inadequate volume of blood received in culture bottles   Culture   Final    NO GROWTH 5 DAYS Performed at [facility], [place] [address], [place], [place] [address]    Report Status 08/06/2019 FINAL  Final  MRSA PCR Screening     Status: None   Collection Time: 08/01/19 12:45 PM  Result Value Ref Range Status   MRSA by PCR NEGATIVE NEGATIVE Final    Comment:        The GeneXpert MRSA Assay (FDA approved for NASAL specimens only), is one component of a comprehensive MRSA colonization surveillance program. It is not intended to diagnose MRSA infection nor to guide or monitor treatment for MRSA infections. Performed at [facility], [place] [address], [place], [place] [address]   Culture, blood (Routine X 2) w Reflex to ID Panel     Status: None   Collection Time: 08/01/19 12:48 PM   Specimen: BLOOD RIGHT WRIST   Result Value Ref Range Status   Specimen Description BLOOD RIGHT WRIST  Final   Special Requests   Final    BOTTLES DRAWN AEROBIC ONLY Blood Culture adequate volume   Culture   Final    NO GROWTH 5 DAYS Performed at [facility], [address], [place], [place] [address]    Report Status 08/06/2019 FINAL  Final  Culture, respiratory (non-expectorated)     Status: None   Collection Time: 08/01/19  3:59 PM   Specimen: Tracheal Aspirate; Respiratory  Result Value Ref Range Status   Specimen Description TRACHEAL ASPIRATE  Final   Special Requests Normal  Final   Gram Stain   Final    FEW WBC PRESENT, PREDOMINANTLY PMN ABUNDANT GRAM POSITIVE COCCI RARE GRAM NEGATIVE RODS RARE GRAM POSITIVE RODS    Culture   Final    FEW Consistent with normal respiratory flora. Performed at [facility], [place] [address] [place], [place] [address]    Report Status 08/04/2019 FINAL  Final    Radiology Reports CT ANGIO HEAD W OR WO CONTRAST  Result Date: 08/05/2019 CLINICAL DATA:  Right-sided weakness EXAM: CT ANGIOGRAPHY HEAD AND NECK TECHNIQUE: Multidetector CT imaging of the head and neck was performed using the standard protocol during bolus administration of intravenous contrast. Multiplanar CT image reconstructions and MIPs were obtained to evaluate the vascular anatomy. Carotid stenosis measurements (when applicable) are obtained utilizing NASCET criteria, using the distal internal carotid diameter as the denominator. CONTRAST:  26m OMNIPAQUE IOHEXOL 350 MG/ML SOLN COMPARISON:  06/19/2019 FINDINGS: CTA NECK Aortic arch: Calcified and irregular noncalcified plaque again identified along the arch. Great vessel origins are patent. Right carotid system: Patent with multifocal calcified and noncalcified plaque. Stable less than 50% stenosis at the ICA origin. Left carotid system: Patent with multifocal calcified and noncalcified plaque. As before, there is approximately 60% stenosis of the  common carotid  at the level of the thyroid. No measurable stenosis at the ICA origin with evidence of prior endarterectomy. Vertebral arteries: Patent with calcified plaque again noted at the origins. Skeleton: Advanced multilevel degenerative changes. Other neck: No acute abnormality. Upper chest: Left greater than right pleural effusions with associated atelectasis. Review of the MIP images confirms the above findings CTA HEAD Anterior circulation: Intracranial internal carotid arteries are patent with calcified plaque causing up to moderate stenosis. Middle and anterior cerebral arteries are patent. Unchanged moderate stenosis of the distal left A2 ACA. Posterior circulation: Intracranial vertebral arteries are patent with calcified and noncalcified plaque. There is unchanged moderate stenosis of the right vertebral artery beyond the PICA origin. Basilar artery is patent with mild atherosclerotic irregularity. Posterior cerebral arteries are patent proximally with atherosclerotic irregularity. There is unchanged diminished opacification of the superior division of the distal left PCA. Venous sinuses: Patent as allowed by contrast bolus timing. Review of the MIP images confirms the above findings IMPRESSION: No substantial change since 06/19/2019. There is irregular noncalcified plaque along the aortic arch. Stable findings of stenosis including left common carotid artery, intracranial right vertebral artery, intracranial ICAs, and left A2 ACA. Persistent pleural effusions and associated atelectasis. Electronically Signed   By: Macy Mis M.D.   On: 08/05/2019 13:50   DG Chest 1 View  Result Date: 08/02/2019 CLINICAL DATA:  Central line placement. EXAM: CHEST  1 VIEW COMPARISON:  08/01/2019.  CT 05/22/2019. FINDINGS: Interim placement left subclavian line, its tip is at the cavoatrial junction. Endotracheal tube and NG tube in stable position. Cardiomegaly. No pulmonary venous congestion. Mild bilateral  interstitial prominence again noted. A component of these changes may be chronic however interstitial edema and/or pneumonitis can not be excluded. Tiny left pleural effusion. No pneumothorax. Surgical clips noted over the neck. IMPRESSION: 1. Interim placement of left subclavian line, its tip is at the cavoatrial junction. Endotracheal tube and NG tube in stable position. 2. Cardiomegaly. No pulmonary venous congestion. Mild bilateral interstitial prominence again noted. A component of these changes may be chronic however interstitial edema and/or pneumonitis cannot be excluded. Tiny left pleural effusion. Electronically Signed   By: Marcello Moores  Register   On: 08/02/2019 06:10   DG Forearm Right  Result Date: 08/04/2019 CLINICAL DATA:  Fall, right-sided weakness EXAM: RIGHT FOREARM - 2 VIEW COMPARISON:  None. FINDINGS: No fracture or malalignment. Fat pad distention at the elbow consistent with an effusion. Prominent posterior elbow soft tissue swelling. Probable cartilaginous calcification at the wrist. IMPRESSION: No definitive fracture is seen, however there is an elbow effusion and occult fracture cannot be excluded. Electronically Signed   By: Donavan Foil M.D.   On: 08/04/2019 19:33   DG Tibia/Fibula Right  Result Date: 08/04/2019 CLINICAL DATA:  [place] during PT profound right-sided weakness EXAM: RIGHT TIBIA AND FIBULA - 2 VIEW COMPARISON:  None. FINDINGS: No acute displaced fracture or malalignment. Vascular calcifications. Soft tissues are unremarkable. IMPRESSION: No acute osseous abnormality. Electronically Signed   By: Donavan Foil M.D.   On: 08/04/2019 19:30   CT HEAD WO CONTRAST  Addendum Date: 08/05/2019   ADDENDUM REPORT: 08/05/2019 11:34 ADDENDUM: Stable trace falcine subdural hematoma Electronically Signed   By: Macy Mis M.D.   On: 08/05/2019 11:34   Result Date: 08/05/2019 CLINICAL DATA:  New right-sided weakness EXAM: CT HEAD WITHOUT CONTRAST TECHNIQUE: Contiguous axial images were  obtained from the base of the skull through the vertex without intravenous contrast. COMPARISON:  08/02/2019 FINDINGS: Brain: There  is no acute intracranial hemorrhage, mass effect, or edema. There is no definite new loss of gray-white differentiation. As before, there are multifocal chronic infarcts involving bilateral cerebral hemispheres including deep gray nuclei and bilateral cerebellar hemispheres. Additional patchy and confluent hypoattenuation in the supratentorial white matter likely reflects advanced chronic microvascular ischemic change. Prominence of the ventricles and sulci reflects stable parenchymal volume loss. Vascular: There is atherosclerotic calcification at the skull base. Skull: Calvarium is unremarkable. Sinuses/Orbits: No acute finding. Other: None. IMPRESSION: No acute intracranial hemorrhage or evidence of acute infarction. Small new infarct difficult to exclude due to extensive chronic ischemic changes. Electronically Signed: By: Macy Mis M.D. On: 08/04/2019 19:39   CT HEAD WO CONTRAST  Result Date: 08/02/2019 CLINICAL DATA:  Follow-up subdural hematoma EXAM: CT HEAD WITHOUT CONTRAST TECHNIQUE: Contiguous axial images were obtained from the base of the skull through the vertex without intravenous contrast. COMPARISON:  Yesterday FINDINGS: Brain: Trace high-density thickening left parafalcine at the vertex, not progressed. This is convincing for subdural hemorrhage when compared with a 06/17/2019 head CT. Extensive chronic small vessel ischemic injury with confluent gliosis in the cerebral white matter. Small remote bilateral cerebellar infarcts. Patchy remote cortical infarcts along the cerebral convexities. Chronic lacune at the right caudate head. No hydrocephalus or masslike finding Vascular: No hyperdense vessel or unexpected calcification. Skull: Normal. Negative for fracture or focal lesion. Sinuses/Orbits: No acute finding. IMPRESSION: 1. Stable trace parafalcine subdural  hematoma. 2. Advanced chronic ischemic injury. Electronically Signed   By: Monte Fantasia M.D.   On: 08/02/2019 06:14   CT HEAD WO CONTRAST  Addendum Date: 08/01/2019   ADDENDUM REPORT: 08/01/2019 13:43 ADDENDUM: Study discussed by telephone with Pulmonary/Critical Care Dr. Valeta Harms on 08/01/2019 at 1339 hours. Electronically Signed   By: Genevie Ann M.D.   On: 08/01/2019 13:43   Result Date: 08/01/2019 CLINICAL DATA:  69 year old female status post cardiac arrest. Dialysis patient. EXAM: CT HEAD WITHOUT CONTRAST TECHNIQUE: Contiguous axial images were obtained from the base of the skull through the vertex without intravenous contrast. COMPARISON:  CTA head and neck 06/19/2019. Head CT 06/17/2019 and earlier. FINDINGS: Brain: Chronic encephalomalacia in the left frontal lobe, left parietal lobe, right greater than left occipital lobes. Superimposed confluent bilateral white matter hypodensity. Chronic hypodensity scattered in the bilateral deep gray matter nuclei. Small chronic cerebellar infarcts again noted. No evidence of acute cerebral edema. No intracranial mass effect. However, there does appear to be a small or trace volume of subdural hematoma along the falx (coronal image 35). But no other acute intracranial hemorrhage identified. Vascular: Calcified atherosclerosis at the skull base. Skull: No acute osseous abnormality identified. Sinuses/Orbits: Visualized paranasal sinuses and mastoids are stable and well pneumatized. Other: Small volume of fluid in the visible pharynx with oral endotracheal and enteric tubes visible. No acute orbit or scalp soft tissue finding. IMPRESSION: 1. Trace acute subdural hematoma along the falx. But no other acute intracranial hemorrhage and no intracranial mass effect. 2. No CT evidence of anoxic injury. Advanced chronic ischemic disease appears stable from the CTA on 06/19/2019. Electronically Signed: By: Genevie Ann M.D. On: 08/01/2019 13:28   CT ANGIO NECK W OR WO  CONTRAST  Result Date: 08/05/2019 CLINICAL DATA:  Right-sided weakness EXAM: CT ANGIOGRAPHY HEAD AND NECK TECHNIQUE: Multidetector CT imaging of the head and neck was performed using the standard protocol during bolus administration of intravenous contrast. Multiplanar CT image reconstructions and MIPs were obtained to evaluate the vascular anatomy. Carotid stenosis measurements (when  applicable) are obtained utilizing NASCET criteria, using the distal internal carotid diameter as the denominator. CONTRAST:  36m OMNIPAQUE IOHEXOL 350 MG/ML SOLN COMPARISON:  06/19/2019 FINDINGS: CTA NECK Aortic arch: Calcified and irregular noncalcified plaque again identified along the arch. Great vessel origins are patent. Right carotid system: Patent with multifocal calcified and noncalcified plaque. Stable less than 50% stenosis at the ICA origin. Left carotid system: Patent with multifocal calcified and noncalcified plaque. As before, there is approximately 60% stenosis of the common carotid at the level of the thyroid. No measurable stenosis at the ICA origin with evidence of prior endarterectomy. Vertebral arteries: Patent with calcified plaque again noted at the origins. Skeleton: Advanced multilevel degenerative changes. Other neck: No acute abnormality. Upper chest: Left greater than right pleural effusions with associated atelectasis. Review of the MIP images confirms the above findings CTA HEAD Anterior circulation: Intracranial internal carotid arteries are patent with calcified plaque causing up to moderate stenosis. Middle and anterior cerebral arteries are patent. Unchanged moderate stenosis of the distal left A2 ACA. Posterior circulation: Intracranial vertebral arteries are patent with calcified and noncalcified plaque. There is unchanged moderate stenosis of the right vertebral artery beyond the PICA origin. Basilar artery is patent with mild atherosclerotic irregularity. Posterior cerebral arteries are patent  proximally with atherosclerotic irregularity. There is unchanged diminished opacification of the superior division of the distal left PCA. Venous sinuses: Patent as allowed by contrast bolus timing. Review of the MIP images confirms the above findings IMPRESSION: No substantial change since 06/19/2019. There is irregular noncalcified plaque along the aortic arch. Stable findings of stenosis including left common carotid artery, intracranial right vertebral artery, intracranial ICAs, and left A2 ACA. Persistent pleural effusions and associated atelectasis. Electronically Signed   By: PMacy MisM.D.   On: 08/05/2019 13:50   MR BRAIN WO CONTRAST  Result Date: 08/05/2019 CLINICAL DATA:  Right-sided weakness EXAM: MRI HEAD WITHOUT CONTRAST TECHNIQUE: Multiplanar, multiecho pulse sequences of the brain and surrounding structures were obtained without intravenous contrast. COMPARISON:  06/17/2019, correlation made with more recent CT imaging FINDINGS: Motion artifact is present. Brain: There are multiple small foci reduced diffusion involving bilateral cerebral hemispheres, bilateral cerebellum and bilateral central gray nuclei. This is superimposed on multifocal chronic infarcts. There is trace subdural hemorrhage along the falx as seen on prior CT. Foci of susceptibility right frontal and parietal lobes compatible with chronic microhemorrhage or less likely mineralization. Confluent areas of T2 hyperintensity in the supratentorial white matter are nonspecific but probably reflect advanced chronic microvascular ischemic changes. There is no intracranial mass or mass effect. Vascular: Major vessel flow voids at the skull base are preserved. Skull and upper cervical spine: Normal marrow signal is preserved. Sinuses/Orbits: Paranasal sinuses are aerated. Orbits are unremarkable. Other: Sella is unremarkable.  Mastoid air cells are clear. IMPRESSION: Numerous small acute infarcts involving multiple vascular  territories bilaterally suggesting a central embolic etiology. Advanced chronic microvascular ischemic changes and multiple chronic infarcts. These results will be called to the ordering clinician or representative by the Radiologist Assistant, and communication documented in the PACS or [employer] Electronically Signed   By: PMacy MisM.D.   On: 08/05/2019 11:47   IR IVC FILTER PLMT / S&I /Burke KeelsGUID/MOD SED  Result Date: 08/06/2019 CLINICAL DATA:  Right lower extremity DVT. Intracranial hemorrhage, a relative contraindication to anticoagulation. Caval filtration requested. EXAM: INFERIOR VENACAVOGRAM IVC FILTER PLACEMENT UNDER FLUOROSCOPY FLUOROSCOPY TIME:  72 seconds; 17 mGy TECHNIQUE: Patency of the right IJ vein was  confirmed with ultrasound with image documentation. An appropriate skin site was determined. Skin site was marked, prepped with chlorhexidine, and draped using maximum barrier technique. The region was infiltrated locally with 1% lidocaine. Intravenous Fentanyl 12.49mg were administered as conscious sedation during continuous monitoring of the patient's level of consciousness and physiological / cardiorespiratory status by the radiology RN, with a total moderate sedation time of 13 minutes. Under real-time ultrasound guidance, the right IJ vein was accessed with a 21 gauge micropuncture needle; the needle tip within the vein was confirmed with ultrasound image documentation. The needle was exchanged over a 018 guidewire for a transitional dilator, which allow advancement of the B[facility], Incwire into the IVC. A long 6 French vascular sheath was placed for inferior venacavography. This demonstrated no caval thrombus. Renal vein inflows were evident. The DD[facility] Center[facility] filter was advanced through the sheath and successfully deployed under fluoroscopy at the L3 level. Followup cavagram demonstrates stable filter position and no evident complication. The sheath was removed and hemostasis achieved at  the site. No immediate complication. IMPRESSION: 1. Normal IVC. No thrombus or significant anatomic variation. 2. Technically successful infrarenal IVC filter placement. This is a retrievable model. PLAN: This IVC filter is potentially retrievable. The patient will be assessed for filter retrieval by Interventional Radiology in approximately 8-12 weeks. Further recommendations regarding filter retrieval, continued surveillance or declaration of device permanence, will be made at that time. Electronically Signed   By: DLucrezia EuropeM.D.   On: 08/06/2019 08:23   PERIPHERAL VASCULAR CATHETERIZATION  Result Date: 07/13/2019 . Patient name: BKAITLYN FRANKOMRN: [id number]: [id number]: female 07/12/2019 Pre-operative Diagnosis: right leg rest pain Post-operative diagnosis:  Same Surgeon:  WAnnamarie MajorProcedure Performed:  1.  Ultrasound-guided access, left femoral artery  2.  Shockwave intra-arterial lithotripsy, right superficial femoral artery  3.  Drug-coated balloon angioplasty, right superficial femoral artery  4.  Conscious sedation, 52 minutes  Indications: The patient underwent last week with intervention of the left leg.  She comes back today for right leg intervention. Procedure:  The patient was identified in the holding area and taken to room 8.  The patient was then placed supine on the table and prepped and draped in the usual sterile fashion.  A time out was called.  Conscious sedation was administered with the use of IV fentanyl and Versed under continuous physician and nurse monitoring.  Heart rate, blood pressure, and oxygen saturation were continuously monitored.  Total sedation time was 52 minutes.  Ultrasound was used to evaluate the left common femoral artery.  It was patent .  A digital ultrasound image was acquired.  A micropuncture needle was used to access the left common femoral artery under ultrasound guidance.  An 018 wire was advanced without resistance and a micropuncture sheath  was placed.  The 018 wire was removed and a benson wire was placed.  The micropuncture sheath was exchanged for a 6 French 45 cm Terumo sheath.  A versa core wire was advanced over the aortic bifurcation which was followed by the 6 French sheath which was placed into the right superficial femoral artery.  The patient was then fully heparinized.  Next, right leg runoff was performed Findings:  Right Lower Extremity: Right common femoral and profundofemoral artery widely patent.  The superficial femoral artery is patent however the proximal third of the artery has luminal narrowing draining then 50%.  At the adductor canal there is a short segment lesion which is heavily calcified with  greater than 70% stenosis.  The anterior tibial is the dominant runoff vessel.  Intervention: At this point, the patient was fully heparinized.  I advanced a 014 wire across the lesions into the below-knee popliteal artery.  I selected a 4 x 40 shockwave lithotripsy balloon and perform balloon angioplasty with lithotripsy in the superficial femoral artery for lesion length of approximately 200 cm.  Once this was completed at 2 and 4 atm on the balloon, a Ranger 4 x 200 balloon was inserted and drug-coated balloon angioplasty was performed for 3 minutes.  Completion imaging showed inline flow with residual stenosis less than 10%.  There was a nonflow limiting dissection at the proximal and distal edge of the intervention which I elected to monitor.  I did not want to put a stent in because of the small size of her arteries.  At this point sheath and wire were removed.  The patient particularly here for sheath electrocoagulation profile correct Impression:  #1 successful shockwave intra-arterial lithotripsy and subsequent drug-coated balloon angioplasty using 4 mm balloons to the right superficial femoral artery with minimal residual stenosis.  There was a nonflow limiting dissection that was not intervened on.  Theotis Burrow, M.D.,  [facility] Office: [phone] Pager:  [phone]  IR Removal Tun Cv Cath W/O FL  Result Date: 07/14/2019 INDICATION: End-stage renal disease on hemodialysis. Now with functional arteriovenous fistula. Request for removal hemodialysis catheter which was placed by vascular surgery. EXAM: REMOVAL OF TUNNELED HEMODIALYSIS CATHETER MEDICATIONS: 1% lidocaine 3 mL COMPLICATIONS: None immediate. PROCEDURE: Informed written consent was obtained from the patient following an explanation of the procedure, risks, benefits and alternatives to treatment. A time out was performed prior to the initiation of the procedure. Maximal barrier sterile technique was utilized including caps, mask, sterile gowns, sterile gloves, large sterile drape, hand hygiene, and ChloraPrep. 1% lidocaine with epinephrine was injected under sterile conditions along the subcutaneous tunnel. Utilizing a combination of blunt dissection and gentle traction, the catheter was removed intact. Hemostasis was obtained with manual compression. A dressing was placed. The patient tolerated the procedure well without immediate post procedural complication. IMPRESSION: Successful removal of tunneled dialysis catheter. Read by: Gareth Eagle, PA-C Electronically Signed   By: Lucrezia Europe M.D.   On: 07/14/2019 11:51   DG CHEST PORT 1 VIEW  Result Date: 08/08/2019 CLINICAL DATA:  69 year old female with chest pain. EXAM: PORTABLE CHEST 1 VIEW COMPARISON:  Chest radiograph dated 08/06/2019. FINDINGS: Left subclavian central venous line with tip close to the cavoatrial junction similar to prior radiograph. Small bilateral pleural effusions, right greater than left with bibasilar atelectasis or infiltrate. Slight improvement in aeration of the lungs and edema compared to prior radiograph. No pneumothorax. Stable cardiomegaly. Coronary vascular calcification and stent. Atherosclerotic calcification of the aorta. No acute osseous  pathology. Osteopenia. IMPRESSION: Small bilateral pleural effusions with bibasilar atelectasis or infiltrate. Slightly improved aeration of the lungs. Electronically Signed   By: Anner Crete M.D.   On: 08/08/2019 02:33   DG CHEST PORT 1 VIEW  Result Date: 08/06/2019 CLINICAL DATA:  Follow-up pleural effusion EXAM: PORTABLE CHEST 1 VIEW COMPARISON:  Four days ago FINDINGS: Extubation of the trachea and esophagus. Left subclavian line with tip in good position. Interstitial opacity and probable layering pleural fluid which is increased. There is also hazy airspace disease. No pneumothorax. Cardiomegaly and vascular pedicle widening. IMPRESSION: Edema and pleural effusions which have progressed from 4 days ago. Electronically Signed  By: Monte Fantasia M.D.   On: 08/06/2019 08:56   DG CHEST PORT 1 VIEW  Result Date: 08/02/2019 CLINICAL DATA:  Endotracheal tube placement. EXAM: PORTABLE CHEST 1 VIEW COMPARISON:  Earlier this day. FINDINGS: Endotracheal tube tip 4.8 cm from the carina. Tip and side port of the enteric tube below the diaphragm in the stomach. Left subclavian central line unchanged in the SVC. Stable cardiomegaly. Unchanged mediastinal contours with aortic atherosclerosis. Interstitial thickening suspicious for pulmonary edema, similar. Minimal retrocardiac opacity and blunting of the costophrenic angle. There is no pneumothorax. IMPRESSION: 1. Endotracheal tube tip 4.8 cm from the carina. Enteric tube in place. 2. Unchanged cardiomegaly and interstitial opacities suspicious for pulmonary edema. Possible small left pleural effusion. Electronically Signed   By: Keith Rake M.D.   On: 08/02/2019 18:59   DG Chest Port 1 View  Result Date: 08/01/2019 CLINICAL DATA:  Intubation.  Assess for pneumothorax. EXAM: PORTABLE CHEST 1 VIEW COMPARISON:  August 01, 2019 7:58 a.m. FINDINGS: Endotracheal tube is identified 5.2 cm from carina. There is no pleural line to suggest pneumothorax. Nasogastric  tube is identified distal tip not included on film but is at least in the stomach. Mild diffuse increased interstitium is identified bilaterally. Patchy opacities of both lung bases are noted. The heart size is mildly enlarged. The bony structures are stable. IMPRESSION: 1. Endotracheal tube 5.2 cm from carina. No pneumothorax. 2. Mild pulmonary edema. 3. Patchy opacities of both lung bases, superimposed pneumonia is not excluded. Electronically Signed   By: Abelardo Diesel M.D.   On: 08/01/2019 13:05   DG Humerus Right  Result Date: 08/04/2019 CLINICAL DATA:  Right-sided weakness history of fall EXAM: RIGHT HUMERUS - 2+ VIEW COMPARISON:  None. FINDINGS: There is no evidence of fracture or other focal bone lesions. Soft tissues are unremarkable. IMPRESSION: Negative. Electronically Signed   By: Donavan Foil M.D.   On: 08/04/2019 19:31   ECHOCARDIOGRAM COMPLETE  Result Date: 08/01/2019    ECHOCARDIOGRAM REPORT   Patient Name:   LUTICIA TADROS Date of Exam: 08/01/2019 Medical Rec #:  [id number]          Height:       62.0 in Accession #:    [id number]         Weight:       89.3 lb Date of Birth:  1950/04/27           BSA:          1.357 m Patient Age:    61 years           BP:           153/69 mmHg Patient Gender: F                  HR:           49 bpm. Exam Location:  Inpatient Procedure: 2D Echo, Cardiac Doppler and Color Doppler Indications:    Cardiac arrest ([place]) [427.5.ICD-9-CM]  History:        Patient has prior history of Echocardiogram examinations, most                 recent 06/20/2019. CAD, Stroke and COPD, Arrythmias:Atrial                 Fibrillation, Signs/Symptoms:Chest Pain; Risk                 Factors:Hypertension, Dyslipidemia and Former Smoker.  Sonographer:    Vickie Epley RDCS  Referring Phys: [id number] GRACE E BOWSER  Sonographer Comments: Echo performed with patient supine and on artificial respirator. IMPRESSIONS  1. Left ventricular ejection fraction, by estimation, is 55 to 60%. The left  ventricle has normal function. The left ventricle has no regional wall motion abnormalities. There is the interventricular septum is mildly flattened in systole and diastole, consistent with right ventricular pressure and volume overload.  2. Right ventricular systolic function is mildly reduced. The right ventricular size is normal. There is severely elevated pulmonary artery systolic pressure. The estimated right ventricular systolic pressure is 26.9 mmHg.  3. Left atrial size was mildly dilated.  4. The mitral valve is abnormal. Mild mitral valve regurgitation.  5. Tricuspid valve regurgitation is mild to moderate.  6. The aortic valve is tricuspid. Aortic valve regurgitation is trivial. Mild aortic valve sclerosis is present, with no evidence of aortic valve stenosis.  7. The inferior vena cava is normal in size with <50% respiratory variability, suggesting right atrial pressure of 8 mmHg. FINDINGS  Left Ventricle: Left ventricular ejection fraction, by estimation, is 55 to 60%. The left ventricle has normal function. The left ventricle has no regional wall motion abnormalities. The left ventricular internal cavity size was normal in size. There is  borderline left ventricular hypertrophy. The interventricular septum is flattened in systole and diastole, consistent with right ventricular pressure and volume overload. Left ventricular diastolic parameters are consistent with Grade I diastolic dysfunction (impaired relaxation). Right Ventricle: The right ventricular size is normal. No increase in right ventricular wall thickness. Right ventricular systolic function is mildly reduced. There is severely elevated pulmonary artery systolic pressure. The tricuspid regurgitant velocity is 3.67 m/s, and with an assumed right atrial pressure of 8 mmHg, the estimated right ventricular systolic pressure is 48.5 mmHg. Left Atrium: Left atrial size was mildly dilated. Right Atrium: Right atrial size was normal in size.  Pericardium: There is no evidence of pericardial effusion. Mitral Valve: The mitral valve is abnormal. There is mild thickening of the mitral valve leaflet(s). There is mild calcification of the mitral valve leaflet(s). Mild to moderate mitral annular calcification. Mild mitral valve regurgitation. Tricuspid Valve: The tricuspid valve is grossly normal. Tricuspid valve regurgitation is mild to moderate. Aortic Valve: The aortic valve is tricuspid. Aortic valve regurgitation is trivial. Mild aortic valve sclerosis is present, with no evidence of aortic valve stenosis. Mild aortic valve annular calcification. Pulmonic Valve: The pulmonic valve was grossly normal. Pulmonic valve regurgitation is trivial. Aorta: The aortic root is normal in size and structure. Venous: The inferior vena cava is normal in size with less than 50% respiratory variability, suggesting right atrial pressure of 8 mmHg. IAS/Shunts: No atrial level shunt detected by color flow Doppler.  LEFT VENTRICLE PLAX 2D LVIDd:         4.20 cm     Diastology LVIDs:         2.70 cm     LV e' lateral:   4.95 cm/s LV PW:         1.00 cm     LV E/e' lateral: 16.0 LV IVS:        1.00 cm     LV e' medial:    3.09 cm/s LVOT diam:     1.50 cm     LV E/e' medial:  25.6 LV SV:         40 LV SV Index:   29 LVOT Area:     1.77 cm  LV Volumes (MOD) LV vol  d, MOD A2C: 74.2 ml LV vol d, MOD A4C: 99.4 ml LV vol s, MOD A2C: 38.1 ml LV vol s, MOD A4C: 50.3 ml LV SV MOD A2C:     36.1 ml LV SV MOD A4C:     99.4 ml LV SV MOD BP:      43.9 ml RIGHT VENTRICLE RV S prime:     6.58 cm/s TAPSE (M-mode): 1.1 cm LEFT ATRIUM             Index       RIGHT ATRIUM           Index LA diam:        4.70 cm 3.46 cm/m  RA Area:     15.30 cm LA Vol (A2C):   47.7 ml 35.15 ml/m RA Volume:   39.10 ml  28.81 ml/m LA Vol (A4C):   50.5 ml 37.22 ml/m LA Biplane Vol: 49.4 ml 36.41 ml/m  AORTIC VALVE LVOT Vmax:   110.00 cm/s LVOT Vmean:  68.000 cm/s LVOT VTI:    0.224 m  AORTA Ao Root diam: 2.90  cm MITRAL VALVE               TRICUSPID VALVE MV Area (PHT): 2.26 cm    TR Peak grad:   53.9 mmHg MV Decel Time: 335 msec    TR Vmax:        367.00 cm/s MV E velocity: 79.20 cm/s MV A velocity: 93.20 cm/s  SHUNTS MV E/A ratio:  0.85        Systemic VTI:  0.22 m                            Systemic Diam: 1.50 cm Rozann Lesches MD Electronically signed by Rozann Lesches MD Signature Date/Time: 08/01/2019/5:36:32 PM    Final    DG FEMUR, MIN 2 VIEWS RIGHT  Result Date: 08/04/2019 CLINICAL DATA:  Fall, right-sided weakness EXAM: RIGHT FEMUR 2 VIEWS COMPARISON:  None. FINDINGS: No fracture or malalignment. Vascular calcifications. Edema within the soft tissues. IMPRESSION: No acute osseous abnormality. Electronically Signed   By: Donavan Foil M.D.   On: 08/04/2019 19:34   VAS [place] LOWER EXTREMITY VENOUS (DVT)  Result Date: 08/05/2019  Lower Venous DVTStudy Indications: Swelling.  Comparison Study: no prior Performing Technologist: Abram Sander RVS  Examination Guidelines: A complete evaluation includes B-mode imaging, spectral Doppler, color Doppler, and power Doppler as needed of all accessible portions of each vessel. Bilateral testing is considered an integral part of a complete examination. Limited examinations for reoccurring indications may be performed as noted. The reflux portion of the exam is performed with the patient in reverse Trendelenburg.  +---------+---------------+---------+-----------+----------+-----------------+  RIGHT     Compressibility Phasicity Spontaneity Properties Thrombus Aging     +---------+---------------+---------+-----------+----------+-----------------+  CFV       Partial         Yes       Yes                    Age Indeterminate  +---------+---------------+---------+-----------+----------+-----------------+  SFJ       None                                             Age Indeterminate  +---------+---------------+---------+-----------+----------+-----------------+  FV Prox   None  Age Indeterminate  +---------+---------------+---------+-----------+----------+-----------------+  FV Mid    None                                             Age Indeterminate  +---------+---------------+---------+-----------+----------+-----------------+  FV Distal None                                             Age Indeterminate  +---------+---------------+---------+-----------+----------+-----------------+  PFV       None                                             Age Indeterminate  +---------+---------------+---------+-----------+----------+-----------------+  POP       None            No        No                     Age Indeterminate  +---------+---------------+---------+-----------+----------+-----------------+  PTV       None                                             Age Indeterminate  +---------+---------------+---------+-----------+----------+-----------------+  PERO      None                                             Age Indeterminate  +---------+---------------+---------+-----------+----------+-----------------+  GSV       None                                                                +---------+---------------+---------+-----------+----------+-----------------+  EIV                       Yes       Yes                                       +---------+---------------+---------+-----------+----------+-----------------+   +----+---------------+---------+-----------+----------+--------------+  LEFT Compressibility Phasicity Spontaneity Properties Thrombus Aging  +----+---------------+---------+-----------+----------+--------------+  CFV  Full            Yes       Yes                                    +----+---------------+---------+-----------+----------+--------------+     Summary: RIGHT: - Findings consistent with age indeterminate deep vein thrombosis involving the right common femoral vein, SF junction, right femoral vein, right proximal profunda vein,  right popliteal vein, and right posterior tibial veins. - Findings consistent with age indeterminate superficial vein thrombosis involving the  right great saphenous vein.   *See table(s) above for measurements and observations. Electronically signed by Harold Barban MD on 08/05/2019 at 5:18:17 PM.    Final    VAS [place] UPPER EXTREMITY VENOUS DUPLEX  Result Date: 08/05/2019 UPPER VENOUS STUDY  Indications: Swelling Comparison Study: no prior Performing Technologist: Abram Sander RVS  Examination Guidelines: A complete evaluation includes B-mode imaging, spectral Doppler, color Doppler, and power Doppler as needed of all accessible portions of each vessel. Bilateral testing is considered an integral part of a complete examination. Limited examinations for reoccurring indications may be performed as noted.  Right Findings: +----------+------------+---------+-----------+----------+-------+  RIGHT      Compressible Phasicity Spontaneous Properties Summary  +----------+------------+---------+-----------+----------+-------+  IJV            Full        Yes        Yes                         +----------+------------+---------+-----------+----------+-------+  Subclavian     Full        Yes        Yes                         +----------+------------+---------+-----------+----------+-------+  Axillary       Full        Yes        Yes                         +----------+------------+---------+-----------+----------+-------+  Brachial       Full        Yes        Yes                         +----------+------------+---------+-----------+----------+-------+  Radial         Full                                               +----------+------------+---------+-----------+----------+-------+  Ulnar          Full                                               +----------+------------+---------+-----------+----------+-------+  Cephalic       Full                                                +----------+------------+---------+-----------+----------+-------+  Basilic        Full                                               +----------+------------+---------+-----------+----------+-------+  Summary:  Right: No evidence of deep vein thrombosis in the upper extremity. No evidence of superficial vein thrombosis in the upper extremity.  *See table(s) above for measurements and observations.  Diagnosing physician: Harold Barban MD Electronically signed by Harold Barban MD on 08/05/2019  at 5:17:35 PM.    Final     Time Spent in minutes  30   Lala Lund M.D on 08/08/2019 at 10:28 AM  To page go to [email or web address] - password [facility]

## 2019-08-08 NOTE — Progress Notes (Signed)
  Speech Language Pathology Treatment: Dysphagia  Patient Details Name: Christina [name] MRN: [id number] DOB: March 21, 1950 Today's Date: 08/08/2019 Time: 9409-8286 SLP Time Calculation (min) (ACUTE ONLY): 10 min  Assessment / Plan / Recommendation Clinical Impression  Observed pt with thin liquids and regular solids. No observable deficits. Pt reports that vocal quality is still mildly hoarse, but she denies any ongoing trouble swallowing and feels that issue has resolved. Recommend pt continue current soft diet given missing dentition. SLP will sign off.   HPI        SLP Plan  All goals met;Discharge SLP treatment due to (comment)       Recommendations  Diet recommendations: Regular;Thin liquid Liquids provided via: Straw;Cup Medication Administration: Whole meds with liquid Supervision: Staff to assist with self feeding Postural Changes and/or Swallow Maneuvers: Seated upright 90 degrees;Upright 30-60 min after meal                Plan: All goals met;Discharge SLP treatment due to (comment)       GO               Herbie [place], MA [place] Pager [phone] Office [phone]  Lynann Beaver 08/08/2019, 2:19 PM

## 2019-08-09 ENCOUNTER — Inpatient Hospital Stay (HOSPITAL_COMMUNITY): Payer: Medicare Other

## 2019-08-09 DIAGNOSIS — R569 Unspecified convulsions: Secondary | ICD-10-CM

## 2019-08-09 LAB — CBC WITH DIFFERENTIAL/PLATELET
Abs Immature Granulocytes: 0.05 10*3/uL (ref 0.00–0.07)
Basophils Absolute: 0 10*3/uL (ref 0.0–0.1)
Basophils Relative: 0 %
Eosinophils Absolute: 0.2 10*3/uL (ref 0.0–0.5)
Eosinophils Relative: 2 %
HCT: 31.3 % — ABNORMAL LOW (ref 36.0–46.0)
Hemoglobin: 9.6 g/dL — ABNORMAL LOW (ref 12.0–15.0)
Immature Granulocytes: 1 %
Lymphocytes Relative: 5 %
Lymphs Abs: 0.4 10*3/uL — ABNORMAL LOW (ref 0.7–4.0)
MCH: 28.5 pg (ref 26.0–34.0)
MCHC: 30.7 g/dL (ref 30.0–36.0)
MCV: 92.9 fL (ref 80.0–100.0)
Monocytes Absolute: 0.9 10*3/uL (ref 0.1–1.0)
Monocytes Relative: 11 %
Neutro Abs: 6.9 10*3/uL (ref 1.7–7.7)
Neutrophils Relative %: 81 %
Platelets: 140 10*3/uL — ABNORMAL LOW (ref 150–400)
RBC: 3.37 MIL/uL — ABNORMAL LOW (ref 3.87–5.11)
RDW: 18.6 % — ABNORMAL HIGH (ref 11.5–15.5)
WBC: 8.5 10*3/uL (ref 4.0–10.5)
nRBC: 0 % (ref 0.0–0.2)

## 2019-08-09 LAB — BASIC METABOLIC PANEL
Anion gap: 15 (ref 5–15)
BUN: 68 mg/dL — ABNORMAL HIGH (ref 8–23)
CO2: 24 mmol/L (ref 22–32)
Calcium: 8.5 mg/dL — ABNORMAL LOW (ref 8.9–10.3)
Chloride: 96 mmol/L — ABNORMAL LOW (ref 98–111)
Creatinine, Ser: 5.96 mg/dL — ABNORMAL HIGH (ref 0.44–1.00)
GFR calc Af Amer: 8 mL/min — ABNORMAL LOW (ref >60)
GFR calc non Af Amer: 7 mL/min — ABNORMAL LOW (ref >60)
Glucose, Bld: 85 mg/dL (ref 70–99)
Potassium: 4.8 mmol/L (ref 3.5–5.1)
Sodium: 135 mmol/L (ref 135–145)

## 2019-08-09 LAB — HEPATITIS B SURFACE ANTIGEN: Hepatitis B Surface Ag: NONREACTIVE

## 2019-08-09 LAB — HEMOGLOBIN A1C
Hgb A1c MFr Bld: 4.4 % — ABNORMAL LOW (ref 4.8–5.6)
Mean Plasma Glucose: 79.58 mg/dL

## 2019-08-09 LAB — CORTISOL: Cortisol, Plasma: 14.1 ug/dL

## 2019-08-09 LAB — GLUCOSE, CAPILLARY: Glucose-Capillary: 102 mg/dL — ABNORMAL HIGH (ref 70–99)

## 2019-08-09 LAB — TSH: TSH: 6.181 u[IU]/mL — ABNORMAL HIGH (ref 0.350–4.500)

## 2019-08-09 MED ORDER — PENTAFLUOROPROP-TETRAFLUOROETH EX AERO: 1.0000 "application " | INHALATION_SPRAY | CUTANEOUS | Status: DC | PRN

## 2019-08-09 MED ORDER — ACETAMINOPHEN 325 MG PO TABS
ORAL_TABLET | ORAL | Status: AC
  Filled 2019-08-09: qty 2

## 2019-08-09 MED ORDER — LIDOCAINE HCL (PF) 1 % IJ SOLN: 5.0000 mL | INTRAMUSCULAR | Status: DC | PRN

## 2019-08-09 MED ORDER — SODIUM CHLORIDE 0.9 % IV SOLN: 100.0000 mL | INTRAVENOUS | Status: DC | PRN

## 2019-08-09 MED ORDER — LIDOCAINE-PRILOCAINE 2.5-2.5 % EX CREA
1.0000 "application " | TOPICAL_CREAM | CUTANEOUS | Status: DC | PRN
  Filled 2019-08-09: qty 5

## 2019-08-09 MED ORDER — ACETAMINOPHEN 325 MG PO TABS
ORAL_TABLET | ORAL | Status: AC
  Filled 2019-08-09: qty 1

## 2019-08-09 MED ORDER — DARBEPOETIN ALFA 60 MCG/0.3ML IJ SOSY
PREFILLED_SYRINGE | INTRAMUSCULAR | Status: AC
  Filled 2019-08-09: qty 0.3

## 2019-08-09 NOTE — Progress Notes (Signed)
Physical Therapy Treatment Patient Details Name: Christina [name] MRN: [id number] DOB: 1950-04-29 Today's Date: 08/09/2019    History of Present Illness Pt is 69 yo female transferred from [facility] 7/5 post asystolic arrest with 10 minute downtime. Hyperkalemia thought to be source of arrest.  Pt with acute metabolic encephalopathy s/p arrest with trace subdural hematoma along the falx noted 7/8. RLE DVT with IVC filter placed 7/9 as anticoagulation not an option. MRI of her brain showed:  numerous small acute infarcts involving multiple vascular territories bilaterally; advanced chronic microvascular ischemic changes and multiple chronic infarcts.  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 Asked pt to move R UE and unable to wiggle fingers and increased difficulty with flex and extend of elbow in comparison to yesterday. With attempt to bring pt to EoB noted to have increased ataxic movement on L side, difficulty with reaching across body to bedrail to attempt to pull trunk into sidelying, very ataxic R UE and then needed assist to move R LE across bed due to ataxia. Pt required max A to come to sitting and minA for maintaining balance.  Had pt perform finger to nose with increased difficulty and assist needed to find nose. However, 2 minutes later pt was holding her nose with her R hand and said "I found my nose." Pt also noted to have difficulty tracking with eyes to R. RN in room witnessing assessment and called physician who ordered head CT and EEG. Pt has been declined CIR admission and PT now recommending SNF level rehab at discharge.     Follow Up Recommendations  SNF     Equipment Recommendations  Rolling walker with 5" wheels;Wheelchair (measurements PT);Wheelchair cushion (measurements  PT);Other (comment) (TBD at next venue)       Precautions / Restrictions Precautions Precautions: Fall Precaution Comments: limited activity tolerance Restrictions Weight Bearing Restrictions: No    Mobility  Bed Mobility Overal bed mobility: Needs Assistance Bed Mobility: Supine to Sit;Sit to Supine     Supine to sit: Max assist Sit to supine: Total assist   General bed mobility comments: max A for coming to EOB, very ataxic on L side with reaching crossbody to rail to pull to upright, able to initiate movement of R LE to EOB,requires assist due to ataxia to bring L LE to EoB, max A for trunk to upright, after assessing movement with RN at EOB, pt requires total A to return to bed   T   Modified Rankin (Stroke Patients Only) Modified Rankin (Stroke Patients Only) Pre-Morbid Rankin Score: Moderate disability Modified Rankin: Moderately severe disability     Balance Overall balance assessment: Needs assistance Sitting-balance support: Feet supported;Single extremity supported Sitting balance-Leahy Scale: Poor Sitting balance - Comments: pt requires min A to maintain EOB sitting                                     Cognition Arousal/Alertness: Awake/alert Behavior During Therapy: WFL for tasks assessed/performed Overall Cognitive Status: Impaired/Different from baseline Area of Impairment: Attention;Memory;Following commands;Safety/judgement;Awareness                 Orientation Level: Disoriented to;Time;Place;Situation (able to correctly identify with clues) Current Attention  Level: Selective;Sustained Memory: Decreased short-term memory Following Commands: Follows one step commands with increased time;Follows multi-step commands with increased time Safety/Judgement: Decreased awareness of safety;Decreased awareness of deficits Awareness: Intellectual Problem Solving: Slow processing;Decreased initiation;Difficulty sequencing;Requires verbal  cues;Requires tactile cues General Comments: pt with slower than usual processing, and following multistep commands         General Comments General comments (skin integrity, edema, etc.): SaO2 on 2 L O2 >94%O2 VSS       Pertinent Vitals/Pain Pain Assessment: Faces Faces Pain Scale: Hurts even more Pain Location: chest/ribs, R LE Pain Descriptors / Indicators: Crushing;Aching;Sore Pain Intervention(s): Limited activity within patient's tolerance;Monitored during session;Repositioned     PT Goals (current goals can now be found in the care plan section) Acute Rehab PT Goals Patient Stated Goal: Pt unable to state  PT Goal Formulation: With patient Time For Goal Achievement: 08/18/19 Potential to Achieve Goals: Good Progress towards PT goals: Not progressing toward goals - comment (increased L sided ataxia with UE?LE movement )    Frequency    Min 2X/week      PT Plan Current plan remains appropriate       AM-PAC PT "6 Clicks" Mobility   Outcome Measure  Help needed turning from your back to your side while in a flat bed without using bedrails?: A Lot Help needed moving from lying on your back to sitting on the side of a flat bed without using bedrails?: A Lot Help needed moving to and from a bed to a chair (including a wheelchair)?: A Lot Help needed standing up from a chair using your arms (e.g., wheelchair or bedside chair)?: A Lot Help needed to walk in hospital room?: A Lot Help needed climbing 3-5 steps with a railing? : Total 6 Click Score: 11    End of Session Equipment Utilized During Treatment: Gait belt;Oxygen Activity Tolerance: Patient tolerated treatment well Patient left: with call bell/phone within reach;in bed;with bed alarm set Nurse Communication: Mobility status PT Visit Diagnosis: Unsteadiness on feet (R26.81);Muscle weakness (generalized) (M62.81);Difficulty in walking, not elsewhere classified (R26.2);Ataxic gait (R26.0) Pain - Right/Left:  Right Pain - part of body:  (chest)     Time: 7846-9629 PT Time Calculation (min) (ACUTE ONLY): 23 min  Charges:  $Therapeutic Activity: 23-37 mins                     Gaius Ishaq B. Migdalia Dk PT, DPT Acute Rehabilitation Services Pager [phone] Office [phone]    [place] 08/09/2019, 4:28 PM

## 2019-08-09 NOTE — Progress Notes (Addendum)
STROKE TEAM PROGRESS NOTE   INTERVAL HISTORY NT at bedside, pt lying in bed, still lethargic. As per Dr. Candiss Norse, pt seems to be weaker and more ataxic on the right UE. On my exam, she has right UE weaker than left UE, right UE ataxic likely due to weakness, and left UE asterixis. Pt anemia improved, and current Hb 9.6. will repeat CT and MRI.   OBJECTIVE Vitals:   08/09/19 1100 08/09/19 1130 08/09/19 1140 08/09/19 1248  BP: 128/76 (!) 173/70 (!) 163/68 (!) 183/65  Pulse: 84 81 80 (!) 107  Resp: 15 15 16 16   Temp:  99 F (37.2 C)  98.3 F (36.8 C)  TempSrc:  Oral  Oral  SpO2: 98% 99%  95%  Weight:  47.8 kg    Height:        CBC:  Recent Labs  Lab 08/08/19 0541 08/09/19 0500  WBC 7.6 8.5  NEUTROABS 6.1 6.9  HGB 9.6* 9.6*  HCT 30.8* 31.3*  MCV 94.2 92.9  PLT 111* 140*    Basic Metabolic Panel:  Recent Labs  Lab 08/03/19 0346 08/03/19 0346 08/03/19 1621 08/03/19 2033 08/07/19 0333 08/07/19 0333 08/08/19 0541 08/09/19 0500  NA 141  --   --    < > 136   < > 133* 135  K 3.3*  --   --    < > 4.4   < > 4.6 4.8  CL 94*  --   --    < > 98   < > 97* 96*  CO2 34*  --   --    < > 27   < > 24 24  GLUCOSE 166*  --   --    < > 138*   < > 175* 85  BUN 43*  --   --    < > 37*   < > 54* 68*  CREATININE 4.41*  --   --    < > 4.01*   < > 5.29* 5.96*  CALCIUM 7.1*  --   --    < > 7.4*   < > 8.1* 8.5*  MG 1.8   < > 1.7  --  1.6*  --  2.3  --   PHOS 6.3*  --  2.1*  --   --   --   --   --    < > = values in this interval not displayed.    Lipid Panel:     Component Value Date/Time   CHOL 86 06/19/2019 0312   TRIG 92 06/19/2019 0312   HDL 37 (L) 06/19/2019 0312   CHOLHDL 2.3 06/19/2019 0312   VLDL 18 06/19/2019 0312   LDLCALC 31 06/19/2019 0312   HgbA1c:  Lab Results  Component Value Date   HGBA1C 4.4 (L) 08/09/2019   Urine Drug Screen:     Component Value Date/Time   LABOPIA NONE DETECTED 07/02/2019 0458   COCAINSCRNUR NONE DETECTED 07/02/2019 0458   LABBENZ NONE  DETECTED 07/02/2019 0458   AMPHETMU NONE DETECTED 07/02/2019 0458   THCU NONE DETECTED 07/02/2019 0458   LABBARB NONE DETECTED 07/02/2019 0458    Alcohol Level     Component Value Date/Time   ETH <10 04/23/2019 2314    IMAGING  CT ANGIO HEAD W OR WO CONTRAST CT ANGIO NECK W OR WO CONTRAST 08/05/2019 IMPRESSION:  No substantial change since 06/19/2019. There is irregular noncalcified plaque along the aortic arch. Stable findings of stenosis including left common carotid artery, intracranial  right vertebral artery, intracranial ICAs, and left A2 ACA. Persistent pleural effusions and associated atelectasis.  CT HEAD WO CONTRAST 08/05/2019   IMPRESSION:  No acute intracranial hemorrhage or evidence of acute infarction. Small new infarct difficult to exclude due to extensive chronic ischemic changes. Stable trace falcine subdural hematoma.   MR BRAIN WO CONTRAST 08/05/2019 IMPRESSION:  Numerous small acute infarcts involving multiple vascular territories bilaterally suggesting a central embolic etiology. Advanced chronic microvascular ischemic changes and multiple chronic infarcts.   DG Forearm Right 08/04/2019 IMPRESSION:  No definitive fracture is seen, however there is an elbow effusion and occult fracture cannot be excluded.    DG Tibia/Fibula Right 08/04/2019 IMPRESSION:  No acute osseous abnormality.   IR IVC FILTER PLMT / S&I /IMG GUID/MOD SED 08/06/2019 IMPRESSION:  1. Normal IVC. No thrombus or significant anatomic variation.  2. Technically successful infrarenal IVC filter placement. This is a retrievable model.  PLAN:  This IVC filter is potentially retrievable. The patient will be assessed for filter retrieval by Interventional Radiology in approximately 8-12 weeks. Further recommendations regarding filter retrieval, continued surveillance or declaration of device permanence, will be made at that time.  DG CHEST PORT 1 VIEW 08/06/2019 IMPRESSION:  Edema and pleural  effusions which have progressed from 4 days ago.  DG Humerus Right 08/04/2019 IMPRESSION:  Negative.    DG FEMUR, MIN 2 VIEWS RIGHT  08/04/2019 IMPRESSION:  No acute osseous abnormality.   VAS [place] LOWER EXTREMITY VENOUS (DVT) 08/05/2019 Summary:  RIGHT: - Findings consistent with age indeterminate deep vein thrombosis involving the right common femoral vein, SF junction, right femoral vein, right proximal profunda vein, right popliteal vein, and right posterior tibial veins. - Findings consistent with age indeterminate superficial vein thrombosis involving the right great saphenous vein.     VAS [place] UPPER EXTREMITY VENOUS DUPLEX 08/05/2019 Summary:   Right: No evidence of deep vein thrombosis in the upper extremity. No evidence of superficial vein thrombosis in the upper extremity.    Transthoracic Echocardiogram  08/01/2019 IMPRESSIONS  1. Left ventricular ejection fraction, by estimation, is 55 to 60%. The  left ventricle has normal function. The left ventricle has no regional  wall motion abnormalities. There is the interventricular septum is mildly  flattened in systole and diastole,  consistent with right ventricular pressure and volume overload.  2. Right ventricular systolic function is mildly reduced. The right  ventricular size is normal. There is severely elevated pulmonary artery  systolic pressure. The estimated right ventricular systolic pressure is  16.6 mmHg.  3. Left atrial size was mildly dilated.  4. The mitral valve is abnormal. Mild mitral valve regurgitation.  5. Tricuspid valve regurgitation is mild to moderate.  6. The aortic valve is tricuspid. Aortic valve regurgitation is trivial.  Mild aortic valve sclerosis is present, with no evidence of aortic valve  stenosis.  7. The inferior vena cava is normal in size with <50% respiratory  variability, suggesting right atrial pressure of 8 mmHg.    ECG - atrial fibrillation - ventricular response 140 BPM (See  cardiology reading for complete details)   PHYSICAL EXAM  Temp:  [97.9 F (36.6 C)-99 F (37.2 C)] 98.3 F (36.8 C) (07/13 1248) Pulse Rate:  [70-107] 107 (07/13 1248) Resp:  [12-16] 16 (07/13 1248) BP: (113-183)/(57-76) 183/65 (07/13 1248) SpO2:  [94 %-99 %] 95 % (07/13 1248) Weight:  [47.8 kg-51.2 kg] 47.8 kg (07/13 1130)  General - Well nourished, well developed, lethargic.  Ophthalmologic - fundi not visualized due  to noncooperation.  Cardiovascular - Regular rhythm and rate, not in afib.  Neuro - awake alert and eyes open but lethargic. Orientated to place and age and yeart but not to month. Mild dysarthria and hypophonia, but no aphasia and follows simple commands. Naming 2/3 and able to repeat. Visual field full, but decreased visual acuity, able to see light, hand weaving but difficult with finger counting. No gaze palsy but seems to have right horizontal nystagmus. PERRL. EOMI. Facial symmetric. Tongue midline. LUE 3+/5, RUE 3-/5 with drift. BLE 3+/5 proximal and 4/5 distally. Sensation symmetric, DTR 1+ and no babinski. L FTN slow but intact, R FTN ataxic proportional to the weakness. Left UE asterixis. Gait not tested.   ASSESSMENT/PLAN Christina [name] is a 69 y.o. female with history of  ESRD (on dialysis T/Th/ Sa), HTN, HLD, A. Fib, COPD, hx of Gi bleed and CAD( 2 stents) who presented to [facility] on 7/5 with VT with a pulse in the ED -> cardioverted. She then went asystolic with 10 mins CPR before ROSC. K was > 7. She was extubated 7/7 and c/o right arm weakness and pain in right arm and leg.  She did not receive IV t-PA.  Stroke: multifocal, numerous small acute infarcts involving multiple vascular territories bilaterally suggesting a central embolic etiology - likely due to PAF not on [facility], cardiac arrest s/p CPR, as well as aortic arch mural thrombus or soft plaque  CT Head - 7/5 - trace acute SDH along the falx.  CT Head - 7/6 - Stable trace parafalcine  SDH advanced chronic ischemia.  CT Head - 7/8 - Stable trace falcine subdural hematoma  CT head - 7/9 - No acute intracranial hemorrhage or evidence of acute infarction. Small new infarct difficult to exclude due to extensive chronic ischemic changes. Stable trace falcine subdural hematoma.   MRI head - Numerous small acute infarcts involving multiple vascular territories bilaterally suggesting a central embolic etiology. Advanced chronic microvascular ischemic changes and multiple chronic infarcts.   CTA H&N - No substantial change since 06/19/2019. There is irregular noncalcified plaque along the aortic arch. Stable findings of stenosis including left common carotid artery, intracranial right vertebral artery, intracranial ICAs, and left A2 ACA. Persistent pleural effusions and associated atelectasis.  CT 7/13 pending  MRI repeat pending  2D Echo - EF 55 - 60%. No cardiac source of emboli identified.   Sars Corona Virus 2 - negative  LDL - 31 (05/2019)  HgbA1c - 4.9 (05/2019)  UDS - negative  VTE prophylaxis - SCDs  aspirin 81 mg daily and clopidogrel 75 mg daily prior to admission, now on aspirin 81 mg daily. Pt now anemia much improved, need SNF placment, therefore, if SDH resolves on CT, will consider to start [facility].   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 SBP goal < 160 mm Hg due to recent SDH . Long-term BP goal normotensive  Hyperlipidemia  Home Lipid lowering medication: Pravachol 80 mg daily  LDL 31, goal < 70  Current lipid lowering medication: Lipitor 40 mg daily   Continue statin at discharge  Other Stroke Risk Factors  Advanced age  Former cigarette smoker - quit  Previous ETOH   Coronary artery disease  Previous marijuana use  Previous strokes by imaging  Other Active Problems  Code status - DNR  ESRD on dialysis  Anemia of chronic disease - Hgb - 8.0->7.7->7.3->8.8->9.6->9.6  Thrombocytopenia, platelet 101->129->133->94->111  Hospital day # 8   Rosalin Hawking, MD PhD Stroke Neurology 08/09/2019 4:37 PM   To contact Stroke Continuity provider, please refer to [email or web address] After hours, contact General Neurology

## 2019-08-09 NOTE — Progress Notes (Signed)
PROGRESS NOTE                                                                                                                                                                                                             Patient Demographics:    Christina [name], is a 69 y.o. female, DOB - 1951/01/22, BCW:[id number]  Admit date - 08/01/2019   Admitting Physician Garner Nash, DO  Outpatient Primary MD for the patient is Nodal, [name], PA-C  LOS - 8  CC - Passed out     Brief Narrative - 69 year old Caucasian female with history of ESRD with noncompliance with dialysis on multiple occasions, CAD s/p 2 stents in the past, SMA stent in May 2021, left carotid endarterectomy in the past, hypertension, dyslipidemia, alcohol abuse, GI bleed, COPD, GERD, history of duodenal ulcers found on EGD in June 2021, anemia of chronic disease, medication noncompliance who was brought to [facility] ER after she had an asystolic arrest at home with 10-minute downtime requiring CPR thought to be due to hyperkalemia.  She was subsequently intubated and transferred to [facility] under pulmonary critical care, while under the care of pulmonary critical care she developed right-sided weakness arm more than leg on 08/04/2019.  Imaging studies of the brain showed stable small subdural hematoma, she was stabilized extubated and transferred to my service on 08/05/2019 on day 4 of her hospital stay.   Subjective:   Patient in bed getting dialysis, says she feels better, denies any headache chest or abdominal pain.  Currently not short of breath.   Assessment  & Plan :     1.  Cardiac arrest with subsequent respiratory failure due to unresponsive state caused by hyperkalemia due to noncompliance with dialysis.  She has been dialyzed, potassium stable, has been extensively counseled on compliance, also discussed this with her [name] over the phone on 08/05/2019.  Echo shows stable EF of around 55%  with no regional wall motion abnormality.  Continue monitoring with supportive care.  2.  CAD s/p stent in the remote past, history of atrial fibrillation, PAD with history of SMA stent, left carotid endarterectomy.  Will place her on statin and Coreg for now for secondary prevention, currently on baby aspirin only Plavix held due to recent subdural hematoma, see #5 below.  3.  ESRD.  TTS schedule.  Nephrology on board.  Counseled on compliance.  4.  Acute metabolic encephalopathy post cardiac arrest.  Worse this morning after an episode  of hypoglycemia last night, supportive care and monitor.  5.  Recent subdural hematoma on CT scan, new right arm weakness x 2 days.  Neurology consulted, MRI brain along with CT angiogram head and neck ordered.  Statin for secondary prevention, baby aspirin only, discussed the case with neurology Dr. Erlinda Hong on 08/06/2019, per neurology repeat head CT on 08/11/2019.  If stable subdural bleed then resume Plavix.  6.  Essential hypertension.  Placed on Coreg.  Will monitor and adjust.  Once ischemic infarct is ruled out will be more aggressive with blood pressure control.  7.  Anemia and chronic thrombocytopenia.  Also has history of duodenal GI bleed in the past, likely has alcoholic cirrhosis as well.  Will transfuse 1 unit of packed RBC on 08/06/2019 due to symptomatic anemia.  Continue to monitor closely.  Continue PPI.  8.  Severe PCM.  Supportive care with nutritional supplements.  9.  History of paroxysmal A. fib.  On beta-blocker.  Severely noncompliant with medications.  Mali vas 2 score of greater than 4.  Not on anticoagulation prior to this admission likely due to severe noncompliance along with history of recent upper GI bleed.  Currently has new SDH as well.  10.  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  Cannot fully anticoagulate due to recent subdural hematoma and recent GI bleed.  Also very noncompliant with medications.  Received  IVC filter by IR on 08/05/2019.  11.  Stress related hyperglycemia.  No DM type II.  Extremely brittle sugar control, A1c is 4.9, multiple drops in blood sugar hands have stopped sliding scale for now.  12.  Possible aspiration pneumonia on 08/06/2019 due to encephalopathy.  Empirically placed on Maxipime + Flagyl x 4 days total and monitor stop date 08/10/2019..  Will also request RT for deep NT suction.  Soft diet with feeding assistance and aspiration precautions.  Keep head of the bed at 35 degrees and monitor.  13.  Intermittent episodes of hypoglycemia.  Cannot explain it due to poor oral intake, off of insulin for over 48 hours.  Check TSH, cortisol, C-peptide, insulin levels, proinsulin levels.  Monitor CBGs every 8.  Lab Results  Component Value Date   HGBA1C 4.4 (L) 08/09/2019   CBG (last 3)  Recent Labs    08/07/19 0829 08/07/19 1231 08/07/19 1612  GLUCAP 84 92 178*    Overall prognosis is extremely poor due to multiple above-mentioned comorbidities and longstanding history of noncompliance.  Daughter was clearly explained on 08/05/2019 over the phone she understands this.  If any further decline we will transition towards comfort.  For now DNR.   Condition - Extremely Guarded  Family Communication  :  [name] [phone] on 08/05/2019  Code Status :  DNR  Consults  :  PCCM, Renal, Neuro  Procedures  :    CT Head 7/5 >> trace acute subdural hematoma along the falx, no other acute intracranial findings.  No CT evidence of anoxic injury  ECHO 7/5 >> LVEF 55-60%, no RWMA, interventricular septum is mildly flattened in systole & diastole consistent with RV & volume overload, RV systolic function is mildly reduced.  Severely elevated pulmonary artery systolic pressure, estimated RV systolic pressure is 53.9.  LA mildly dilated, mild to moderate TR  CT Head 7/6 & 7/7 >> stable trace parafalcine subdural hematoma, advanced chronic ischemic injury  ETT 7/5 >> 7/7   R  Fem TLC 7/5 >> 7/5  L IJ CL 7/5 >>  Venous ultrasound.  Multiple right leg DVTs.  Likely subacute.    MRI brain - Numerous small acute infarcts involving multiple vascular territories bilaterally suggesting a central embolic etiology.  Advanced chronic microvascular ischemic changes and multiple chronic infarcts.   CT angiogram head and neck. No substantial change since 06/19/2019. There is irregular noncalcified plaque along the aortic arch. Stable findings of stenosis including left common carotid artery, intracranial right vertebral artery, intracranial ICAs, and left A2 ACA. Persistent pleural effusions and associated atelectasis.  IVC filter placement on 08/05/2019.   PUD Prophylaxis : PPI  Disposition Plan  :    Status is: Inpatient  Remains inpatient appropriate because:Unsafe d/c plan, work-up for CVA.   Dispo: The patient is from: Home              Anticipated d/c is to: SNF              Anticipated d/c date is: > 3 days              Patient currently is not medically stable to d/c.  DVT Prophylaxis  :    SCDs    Lab Results  Component Value Date   PLT 140 (L) 08/09/2019    Diet :  Diet Order            Diet renal with fluid restriction Fluid restriction: 1200 mL Fluid; Room service appropriate? Yes; Fluid consistency: Thin  Diet effective now                  Inpatient Medications Scheduled Meds: . sodium chloride   Intravenous Once  . acetaminophen      . acetaminophen      . amiodarone  200 mg Oral BID  . amLODipine  10 mg Oral Daily  . arformoterol  15 mcg Nebulization BID  . aspirin  81 mg Oral Daily  . atorvastatin  40 mg Oral Daily  . budesonide (PULMICORT) nebulizer solution  0.5 mg Nebulization BID  . carvedilol  6.25 mg Oral BID WC  . chlorhexidine gluconate (MEDLINE KIT)  15 mL Mouth Rinse BID  . Chlorhexidine Gluconate Cloth  6 each Topical Q0600  . Chlorhexidine Gluconate Cloth  6 each Topical Q0600  . Chlorhexidine Gluconate Cloth  6  each Topical Q0600  . Darbepoetin Alfa      . darbepoetin (ARANESP) injection - DIALYSIS  60 mcg Intravenous Q Tue-HD  . feeding supplement (NEPRO CARB STEADY)  237 mL Oral TID BM  . gabapentin  300 mg Oral BID  . heparin injection (subcutaneous)  5,000 Units Subcutaneous Q8H  . mouth rinse  15 mL Mouth Rinse BID  . multivitamin  1 tablet Oral QHS  . neomycin-bacitracin-polymyxin   Topical BID  . pantoprazole  40 mg Oral BID  . sodium chloride flush  10-40 mL Intracatheter Q12H   Continuous Infusions: . sodium chloride    . sodium chloride    . ceFEPime (MAXIPIME) IV    . metronidazole 500 mg (08/09/19 0200)   PRN Meds:.sodium chloride, sodium chloride, acetaminophen, albuterol, docusate sodium, guaiFENesin, hydrALAZINE, HYDROcodone-acetaminophen, lidocaine (PF), lidocaine-prilocaine, midodrine, ondansetron (ZOFRAN) IV, pentafluoroprop-tetrafluoroeth, polyethylene glycol, traMADol  Antibiotics  :   Anti-infectives (From admission, onward)   Start     Dose/Rate Route Frequency Ordered Stop   08/09/19 1800  ceFEPIme (MAXIPIME) 2 g in sodium chloride 0.9 % 100 mL IVPB     Discontinue     2 g 200 mL/hr over 30 Minutes Intravenous  Every T-Th-Sa (Hemodialysis) 08/07/19 1054 08/13/19 1759   08/06/19 1800  ceFEPIme (MAXIPIME) 2 g in sodium chloride 0.9 % 100 mL IVPB  Status:  Discontinued        2 g 200 mL/hr over 30 Minutes Intravenous Every T-Th-Sa (Hemodialysis) 08/06/19 1034 08/07/19 1054   08/06/19 1200  metroNIDAZOLE (FLAGYL) IVPB 500 mg     Discontinue     500 mg 100 mL/hr over 60 Minutes Intravenous Every 8 hours 08/06/19 1034            Objective:   Vitals:   08/09/19 0830 08/09/19 0854 08/09/19 0900 08/09/19 0915  BP: 134/66 (!) 153/73 116/61 (!) 148/65  Pulse: 82 84 86 82  Resp: _0 Temp:      TempSrc:      SpO2: 94% 95%    Weight:      Height:        SpO2: 95 % O2 Flow Rate (L/min): 2 L/min FiO2 (%): 30 %  Wt Readings from Last 3 Encounters:    08/09/19 51.2 kg  07/14/19 40.5 kg  06/20/19 50.8 kg     Intake/Output Summary (Last 24 hours) at 08/09/2019 0923 Last data filed at 08/09/2019 0544 Gross per 24 hour  Intake 1180 ml  Output 500 ml  Net 680 ml     Physical Exam  Awake Alert, right arm strength 3/5 improving [place].AT,PERRAL Supple Neck,No JVD, No cervical lymphadenopathy appriciated.  Symmetrical Chest wall movement, Good air movement bilaterally, CTAB RRR,No Gallops, Rubs or new Murmurs, No Parasternal Heave +ve B.Sounds, Abd Soft, No tenderness, No organomegaly appriciated, No rebound - guarding or rigidity. Cyanosis in both feet with multiple toes cyanosed and has a chronic multiple dry black toe tips, large bruise anterior part of the right middle leg with bndage covering a skin tear ( came with it)    Data Review:    Recent Labs  Lab 08/05/19 0500 08/06/19 0540 08/07/19 0333 08/08/19 0541 08/09/19 0500  WBC 17.1* 10.3 7.9 7.6 8.5  HGB 7.7* 7.3* 8.8* 9.6* 9.6*  HCT 25.7* 25.1* 28.7* 30.8* 31.3*  PLT 129* 133* 94* 111* 140*  MCV 98.5 98.8 92.9 94.2 92.9  MCH 29.5 28.7 28.5 29.4 28.5  MCHC 30.0 29.1* 30.7 31.2 30.7  RDW 19.0* 19.2* 20.7* 19.5* 18.6*  LYMPHSABS  --   --  0.6* 0.4* 0.4*  MONOABS  --   --  0.7 0.7 0.9  EOSABS  --   --  0.4 0.3 0.2  BASOSABS  --   --  0.0 0.0 0.0    Recent Labs  Lab 08/02/19 1845 08/03/19 0346 08/03/19 1621 08/03/19 2033 08/05/19 0500 08/06/19 0540 08/06/19 1010 08/07/19 0333 08/08/19 0541 08/08/19 0542 08/09/19 0500 08/09/19 0738  NA  --  141  --    < > 137 132*  --  136 133*  --  135  --   K  --  3.3*  --    < > 4.9 4.7  --  4.4 4.6  --  4.8  --   CL  --  94*  --    < > 96* 93*  --  98 97*  --  96*  --   CO2  --  34*  --    < > 30 28  --  27 24  --  24  --   GLUCOSE  --  166*  --    < > 124* 289*  --  138*  175*  --  85  --   BUN  --  43*  --    < > 39* 52*  --  37* 54*  --  68*  --   CREATININE  --  4.41*  --    < > 4.16* 5.52*  --  4.01* 5.29*  --   5.96*  --   CALCIUM  --  7.1*  --    < > 8.6* 7.6*  --  7.4* 8.1*  --  8.5*  --   AST  --  305*  --   --  62* 35  --   --   --   --   --   --   ALT  --  446*  --   --  222* 145*  --   --   --   --   --   --   ALKPHOS  --  73  --   --  86 75  --   --   --   --   --   --   BILITOT  --  0.8  --   --  1.0 1.1  --   --   --   --   --   --   ALBUMIN  --  1.7*  --   --  2.1* 2.0*  --   --   --   --   --   --   MG 1.8 1.8 1.7  --   --   --   --  1.6* 2.3  --   --   --   CRP  --   --   --   --   --   --   --  6.0* 8.6*  --   --   --   PROCALCITON  --   --   --   --   --   --  1.45 1.35 1.03  --   --   --   HGBA1C  --   --   --   --   --   --   --   --   --   --   --  4.4*  BNP  --   --   --   --   --   --   --  3,410.4*  --  2,308.3*  --   --    < > = values in this interval not displayed.    Recent Labs  Lab 08/06/19 1010 08/07/19 0333 08/08/19 0541 08/08/19 0542  CRP  --  6.0* 8.6*  --   BNP  --  3,410.4*  --  2,308.3*  PROCALCITON 1.45 1.35 1.03  --     ------------------------------------------------------------------------------------------------------------------ No results for input(s): CHOL, HDL, LDLCALC, TRIG, CHOLHDL, LDLDIRECT in the last 72 hours.  Lab Results  Component Value Date   HGBA1C 4.4 (L) 08/09/2019   ------------------------------------------------------------------------------------------------------------------ No results for input(s): TSH, T4TOTAL, T3FREE, THYROIDAB in the last 72 hours.  Invalid input(s): FREET3 ------------------------------------------------------------------------------------------------------------------ No results for input(s): VITAMINB12, FOLATE, FERRITIN, TIBC, IRON, RETICCTPCT in the last 72 hours.  Coagulation profile No results for input(s): INR, PROTIME in the last 168 hours.  No results for input(s): DDIMER in the last 72 hours.  Cardiac Enzymes No results for input(s): CKMB, TROPONINI, MYOGLOBIN in the last 168  hours.  Invalid input(s): CK ------------------------------------------------------------------------------------------------------------------    Component Value Date/Time   BNP 2,308.3 (H) 08/08/2019 0542    Micro Results  Recent Results (from the past 240 hour(s))  SARS Coronavirus 2 by RT PCR (hospital order, performed in [facility] hospital lab) Nasopharyngeal Nasopharyngeal Swab     Status: None   Collection Time: 08/01/19 12:08 PM   Specimen: Nasopharyngeal Swab  Result Value Ref Range Status   SARS Coronavirus 2 NEGATIVE NEGATIVE Final    Comment: (NOTE) SARS-CoV-2 target nucleic acids are NOT DETECTED.  The SARS-CoV-2 RNA is generally detectable in upper and lower respiratory specimens during the acute phase of infection. The lowest concentration of SARS-CoV-2 viral copies this assay can detect is 250 copies / mL. A negative result does not preclude SARS-CoV-2 infection and should not be used as the sole basis for treatment or other patient management decisions.  A negative result may occur with improper specimen collection / handling, submission of specimen other than nasopharyngeal swab, presence of viral mutation(s) within the areas targeted by this assay, and inadequate number of viral copies (<250 copies / mL). A negative result must be combined with clinical observations, patient history, and epidemiological information.  Fact Sheet for Patients:   StrictlyIdeas.no  Fact Sheet for Healthcare Providers: BankingDealers.co.za  This test is not yet approved or  cleared by the [place] FDA and has been authorized for detection and/or diagnosis of SARS-CoV-2 by FDA under an Emergency Use Authorization (EUA).  This EUA will remain in effect (meaning this test can be used) for the duration of the COVID-19 declaration under Section 564(b)(1) of the Act, 21 U.S.C. section 360bbb-3(b)(1), unless the authorization is  terminated or revoked sooner.  Performed at [facility], [place] [address], [place], [place] [address]   Culture, blood (Routine X 2) w Reflex to ID Panel     Status: None   Collection Time: 08/01/19 12:44 PM   Specimen: BLOOD RIGHT ARM  Result Value Ref Range Status   Specimen Description BLOOD RIGHT ARM  Final   Special Requests   Final    BOTTLES DRAWN AEROBIC ONLY Blood Culture results may not be optimal due to an inadequate volume of blood received in culture bottles   Culture   Final    NO GROWTH 5 DAYS Performed at [facility], [place] [address] [place], [place] [address]    Report Status 08/06/2019 FINAL  Final  MRSA PCR Screening     Status: None   Collection Time: 08/01/19 12:45 PM  Result Value Ref Range Status   MRSA by PCR NEGATIVE NEGATIVE Final    Comment:        The GeneXpert MRSA Assay (FDA approved for NASAL specimens only), is one component of a comprehensive MRSA colonization surveillance program. It is not intended to diagnose MRSA infection nor to guide or monitor treatment for MRSA infections. Performed at [facility], [place] [address] [place], [place] [address]   Culture, blood (Routine X 2) w Reflex to ID Panel     Status: None   Collection Time: 08/01/19 12:48 PM   Specimen: BLOOD RIGHT WRIST  Result Value Ref Range Status   Specimen Description BLOOD RIGHT WRIST  Final   Special Requests   Final    BOTTLES DRAWN AEROBIC ONLY Blood Culture adequate volume   Culture   Final    NO GROWTH 5 DAYS Performed at [facility], [address], [place], [place] [address]    Report Status 08/06/2019 FINAL  Final  Culture, respiratory (non-expectorated)     Status: None   Collection  Time: 08/01/19  3:59 PM   Specimen: Tracheal Aspirate; Respiratory  Result Value Ref Range Status   Specimen Description TRACHEAL ASPIRATE  Final   Special Requests Normal  Final   Gram Stain   Final    FEW WBC PRESENT, PREDOMINANTLY PMN ABUNDANT  GRAM POSITIVE COCCI RARE GRAM NEGATIVE RODS RARE GRAM POSITIVE RODS    Culture   Final    FEW Consistent with normal respiratory flora. Performed at [facility], [place] [address] [place], [place] [address]    Report Status 08/04/2019 FINAL  Final    Radiology Reports CT ANGIO HEAD W OR WO CONTRAST  Result Date: 08/05/2019 CLINICAL DATA:  Right-sided weakness EXAM: CT ANGIOGRAPHY HEAD AND NECK TECHNIQUE: Multidetector CT imaging of the head and neck was performed using the standard protocol during bolus administration of intravenous contrast. Multiplanar CT image reconstructions and MIPs were obtained to evaluate the vascular anatomy. Carotid stenosis measurements (when applicable) are obtained utilizing NASCET criteria, using the distal internal carotid diameter as the denominator. CONTRAST:  35m OMNIPAQUE IOHEXOL 350 MG/ML SOLN COMPARISON:  06/19/2019 FINDINGS: CTA NECK Aortic arch: Calcified and irregular noncalcified plaque again identified along the arch. Great vessel origins are patent. Right carotid system: Patent with multifocal calcified and noncalcified plaque. Stable less than 50% stenosis at the ICA origin. Left carotid system: Patent with multifocal calcified and noncalcified plaque. As before, there is approximately 60% stenosis of the common carotid at the level of the thyroid. No measurable stenosis at the ICA origin with evidence of prior endarterectomy. Vertebral arteries: Patent with calcified plaque again noted at the origins. Skeleton: Advanced multilevel degenerative changes. Other neck: No acute abnormality. Upper chest: Left greater than right pleural effusions with associated atelectasis. Review of the MIP images confirms the above findings CTA HEAD Anterior circulation: Intracranial internal carotid arteries are patent with calcified plaque causing up to moderate stenosis. Middle and anterior cerebral arteries are patent. Unchanged moderate stenosis of the distal left A2  ACA. Posterior circulation: Intracranial vertebral arteries are patent with calcified and noncalcified plaque. There is unchanged moderate stenosis of the right vertebral artery beyond the PICA origin. Basilar artery is patent with mild atherosclerotic irregularity. Posterior cerebral arteries are patent proximally with atherosclerotic irregularity. There is unchanged diminished opacification of the superior division of the distal left PCA. Venous sinuses: Patent as allowed by contrast bolus timing. Review of the MIP images confirms the above findings IMPRESSION: No substantial change since 06/19/2019. There is irregular noncalcified plaque along the aortic arch. Stable findings of stenosis including left common carotid artery, intracranial right vertebral artery, intracranial ICAs, and left A2 ACA. Persistent pleural effusions and associated atelectasis. Electronically Signed   By: PMacy MisM.D.   On: 08/05/2019 13:50   DG Chest 1 View  Result Date: 08/02/2019 CLINICAL DATA:  Central line placement. EXAM: CHEST  1 VIEW COMPARISON:  08/01/2019.  CT 05/22/2019. FINDINGS: Interim placement left subclavian line, its tip is at the cavoatrial junction. Endotracheal tube and NG tube in stable position. Cardiomegaly. No pulmonary venous congestion. Mild bilateral interstitial prominence again noted. A component of these changes may be chronic however interstitial edema and/or pneumonitis can not be excluded. Tiny left pleural effusion. No pneumothorax. Surgical clips noted over the neck. IMPRESSION: 1. Interim placement of left subclavian line, its tip is at the cavoatrial junction. Endotracheal tube and NG tube in stable position. 2. Cardiomegaly. No pulmonary venous congestion. Mild bilateral interstitial prominence again noted. A component of these changes  may be chronic however interstitial edema and/or pneumonitis cannot be excluded. Tiny left pleural effusion. Electronically Signed   By: Marcello Moores  Register    On: 08/02/2019 06:10   DG Forearm Right  Result Date: 08/04/2019 CLINICAL DATA:  Fall, right-sided weakness EXAM: RIGHT FOREARM - 2 VIEW COMPARISON:  None. FINDINGS: No fracture or malalignment. Fat pad distention at the elbow consistent with an effusion. Prominent posterior elbow soft tissue swelling. Probable cartilaginous calcification at the wrist. IMPRESSION: No definitive fracture is seen, however there is an elbow effusion and occult fracture cannot be excluded. Electronically Signed   By: Donavan Foil M.D.   On: 08/04/2019 19:33   DG Tibia/Fibula Right  Result Date: 08/04/2019 CLINICAL DATA:  [place] during PT profound right-sided weakness EXAM: RIGHT TIBIA AND FIBULA - 2 VIEW COMPARISON:  None. FINDINGS: No acute displaced fracture or malalignment. Vascular calcifications. Soft tissues are unremarkable. IMPRESSION: No acute osseous abnormality. Electronically Signed   By: Donavan Foil M.D.   On: 08/04/2019 19:30   CT HEAD WO CONTRAST  Addendum Date: 08/05/2019   ADDENDUM REPORT: 08/05/2019 11:34 ADDENDUM: Stable trace falcine subdural hematoma Electronically Signed   By: Macy Mis M.D.   On: 08/05/2019 11:34   Result Date: 08/05/2019 CLINICAL DATA:  New right-sided weakness EXAM: CT HEAD WITHOUT CONTRAST TECHNIQUE: Contiguous axial images were obtained from the base of the skull through the vertex without intravenous contrast. COMPARISON:  08/02/2019 FINDINGS: Brain: There is no acute intracranial hemorrhage, mass effect, or edema. There is no definite new loss of gray-white differentiation. As before, there are multifocal chronic infarcts involving bilateral cerebral hemispheres including deep gray nuclei and bilateral cerebellar hemispheres. Additional patchy and confluent hypoattenuation in the supratentorial white matter likely reflects advanced chronic microvascular ischemic change. Prominence of the ventricles and sulci reflects stable parenchymal volume loss. Vascular: There is  atherosclerotic calcification at the skull base. Skull: Calvarium is unremarkable. Sinuses/Orbits: No acute finding. Other: None. IMPRESSION: No acute intracranial hemorrhage or evidence of acute infarction. Small new infarct difficult to exclude due to extensive chronic ischemic changes. Electronically Signed: By: Macy Mis M.D. On: 08/04/2019 19:39   CT HEAD WO CONTRAST  Result Date: 08/02/2019 CLINICAL DATA:  Follow-up subdural hematoma EXAM: CT HEAD WITHOUT CONTRAST TECHNIQUE: Contiguous axial images were obtained from the base of the skull through the vertex without intravenous contrast. COMPARISON:  Yesterday FINDINGS: Brain: Trace high-density thickening left parafalcine at the vertex, not progressed. This is convincing for subdural hemorrhage when compared with a 06/17/2019 head CT. Extensive chronic small vessel ischemic injury with confluent gliosis in the cerebral white matter. Small remote bilateral cerebellar infarcts. Patchy remote cortical infarcts along the cerebral convexities. Chronic lacune at the right caudate head. No hydrocephalus or masslike finding Vascular: No hyperdense vessel or unexpected calcification. Skull: Normal. Negative for fracture or focal lesion. Sinuses/Orbits: No acute finding. IMPRESSION: 1. Stable trace parafalcine subdural hematoma. 2. Advanced chronic ischemic injury. Electronically Signed   By: Monte Fantasia M.D.   On: 08/02/2019 06:14   CT HEAD WO CONTRAST  Addendum Date: 08/01/2019   ADDENDUM REPORT: 08/01/2019 13:43 ADDENDUM: Study discussed by telephone with Pulmonary/Critical Care Dr. Valeta Harms on 08/01/2019 at 1339 hours. Electronically Signed   By: Genevie Ann M.D.   On: 08/01/2019 13:43   Result Date: 08/01/2019 CLINICAL DATA:  69 year old female status post cardiac arrest. Dialysis patient. EXAM: CT HEAD WITHOUT CONTRAST TECHNIQUE: Contiguous axial images were obtained from the base of the skull through the vertex without intravenous  contrast. COMPARISON:   CTA head and neck 06/19/2019. Head CT 06/17/2019 and earlier. FINDINGS: Brain: Chronic encephalomalacia in the left frontal lobe, left parietal lobe, right greater than left occipital lobes. Superimposed confluent bilateral white matter hypodensity. Chronic hypodensity scattered in the bilateral deep gray matter nuclei. Small chronic cerebellar infarcts again noted. No evidence of acute cerebral edema. No intracranial mass effect. However, there does appear to be a small or trace volume of subdural hematoma along the falx (coronal image 35). But no other acute intracranial hemorrhage identified. Vascular: Calcified atherosclerosis at the skull base. Skull: No acute osseous abnormality identified. Sinuses/Orbits: Visualized paranasal sinuses and mastoids are stable and well pneumatized. Other: Small volume of fluid in the visible pharynx with oral endotracheal and enteric tubes visible. No acute orbit or scalp soft tissue finding. IMPRESSION: 1. Trace acute subdural hematoma along the falx. But no other acute intracranial hemorrhage and no intracranial mass effect. 2. No CT evidence of anoxic injury. Advanced chronic ischemic disease appears stable from the CTA on 06/19/2019. Electronically Signed: By: Genevie Ann M.D. On: 08/01/2019 13:28   CT ANGIO NECK W OR WO CONTRAST  Result Date: 08/05/2019 CLINICAL DATA:  Right-sided weakness EXAM: CT ANGIOGRAPHY HEAD AND NECK TECHNIQUE: Multidetector CT imaging of the head and neck was performed using the standard protocol during bolus administration of intravenous contrast. Multiplanar CT image reconstructions and MIPs were obtained to evaluate the vascular anatomy. Carotid stenosis measurements (when applicable) are obtained utilizing NASCET criteria, using the distal internal carotid diameter as the denominator. CONTRAST:  20m OMNIPAQUE IOHEXOL 350 MG/ML SOLN COMPARISON:  06/19/2019 FINDINGS: CTA NECK Aortic arch: Calcified and irregular noncalcified plaque again  identified along the arch. Great vessel origins are patent. Right carotid system: Patent with multifocal calcified and noncalcified plaque. Stable less than 50% stenosis at the ICA origin. Left carotid system: Patent with multifocal calcified and noncalcified plaque. As before, there is approximately 60% stenosis of the common carotid at the level of the thyroid. No measurable stenosis at the ICA origin with evidence of prior endarterectomy. Vertebral arteries: Patent with calcified plaque again noted at the origins. Skeleton: Advanced multilevel degenerative changes. Other neck: No acute abnormality. Upper chest: Left greater than right pleural effusions with associated atelectasis. Review of the MIP images confirms the above findings CTA HEAD Anterior circulation: Intracranial internal carotid arteries are patent with calcified plaque causing up to moderate stenosis. Middle and anterior cerebral arteries are patent. Unchanged moderate stenosis of the distal left A2 ACA. Posterior circulation: Intracranial vertebral arteries are patent with calcified and noncalcified plaque. There is unchanged moderate stenosis of the right vertebral artery beyond the PICA origin. Basilar artery is patent with mild atherosclerotic irregularity. Posterior cerebral arteries are patent proximally with atherosclerotic irregularity. There is unchanged diminished opacification of the superior division of the distal left PCA. Venous sinuses: Patent as allowed by contrast bolus timing. Review of the MIP images confirms the above findings IMPRESSION: No substantial change since 06/19/2019. There is irregular noncalcified plaque along the aortic arch. Stable findings of stenosis including left common carotid artery, intracranial right vertebral artery, intracranial ICAs, and left A2 ACA. Persistent pleural effusions and associated atelectasis. Electronically Signed   By: PMacy MisM.D.   On: 08/05/2019 13:50   MR BRAIN WO  CONTRAST  Result Date: 08/05/2019 CLINICAL DATA:  Right-sided weakness EXAM: MRI HEAD WITHOUT CONTRAST TECHNIQUE: Multiplanar, multiecho pulse sequences of the brain and surrounding structures were obtained without intravenous contrast. COMPARISON:  06/17/2019, correlation made  with more recent CT imaging FINDINGS: Motion artifact is present. Brain: There are multiple small foci reduced diffusion involving bilateral cerebral hemispheres, bilateral cerebellum and bilateral central gray nuclei. This is superimposed on multifocal chronic infarcts. There is trace subdural hemorrhage along the falx as seen on prior CT. Foci of susceptibility right frontal and parietal lobes compatible with chronic microhemorrhage or less likely mineralization. Confluent areas of T2 hyperintensity in the supratentorial white matter are nonspecific but probably reflect advanced chronic microvascular ischemic changes. There is no intracranial mass or mass effect. Vascular: Major vessel flow voids at the skull base are preserved. Skull and upper cervical spine: Normal marrow signal is preserved. Sinuses/Orbits: Paranasal sinuses are aerated. Orbits are unremarkable. Other: Sella is unremarkable.  Mastoid air cells are clear. IMPRESSION: Numerous small acute infarcts involving multiple vascular territories bilaterally suggesting a central embolic etiology. Advanced chronic microvascular ischemic changes and multiple chronic infarcts. These results will be called to the ordering clinician or representative by the Radiologist Assistant, and communication documented in the PACS or [employer]. Electronically Signed   By: Macy Mis M.D.   On: 08/05/2019 11:47   IR IVC FILTER PLMT / S&I Burke Keels GUID/MOD SED  Result Date: 08/06/2019 CLINICAL DATA:  Right lower extremity DVT. Intracranial hemorrhage, a relative contraindication to anticoagulation. Caval filtration requested. EXAM: INFERIOR VENACAVOGRAM IVC FILTER PLACEMENT UNDER  FLUOROSCOPY FLUOROSCOPY TIME:  72 seconds; 17 mGy TECHNIQUE: Patency of the right IJ vein was confirmed with ultrasound with image documentation. An appropriate skin site was determined. Skin site was marked, prepped with chlorhexidine, and draped using maximum barrier technique. The region was infiltrated locally with 1% lidocaine. Intravenous Fentanyl 12.20mg were administered as conscious sedation during continuous monitoring of the patient's level of consciousness and physiological / cardiorespiratory status by the radiology RN, with a total moderate sedation time of 13 minutes. Under real-time ultrasound guidance, the right IJ vein was accessed with a 21 gauge micropuncture needle; the needle tip within the vein was confirmed with ultrasound image documentation. The needle was exchanged over a 018 guidewire for a transitional dilator, which allow advancement of the [facility] Centerwire into the IVC. A long 6 French vascular sheath was placed for inferior venacavography. This demonstrated no caval thrombus. Renal vein inflows were evident. The DVal[facility] CenterIVC filter was advanced through the sheath and successfully deployed under fluoroscopy at the L3 level. Followup cavagram demonstrates stable filter position and no evident complication. The sheath was removed and hemostasis achieved at the site. No immediate complication. IMPRESSION: 1. Normal IVC. No thrombus or significant anatomic variation. 2. Technically successful infrarenal IVC filter placement. This is a retrievable model. PLAN: This IVC filter is potentially retrievable. The patient will be assessed for filter retrieval by Interventional Radiology in approximately 8-12 weeks. Further recommendations regarding filter retrieval, continued surveillance or declaration of device permanence, will be made at that time. Electronically Signed   By: DLucrezia EuropeM.D.   On: 08/06/2019 08:23   PERIPHERAL VASCULAR CATHETERIZATION  Result Date: 07/13/2019 . Patient name:  BPORCHIA SINKLERMRN: [id number]: [birth year]Sex: female 07/12/2019 Pre-operative Diagnosis: right leg rest pain Post-operative diagnosis:  Same Surgeon:  WAnnamarie MajorProcedure Performed:  1.  Ultrasound-guided access, left femoral artery  2.  Shockwave intra-arterial lithotripsy, right superficial femoral artery  3.  Drug-coated balloon angioplasty, right superficial femoral artery  4.  Conscious sedation, 52 minutes  Indications: The patient underwent last week with intervention of the left leg.  She comes back today for right  leg intervention. Procedure:  The patient was identified in the holding area and taken to room 8.  The patient was then placed supine on the table and prepped and draped in the usual sterile fashion.  A time out was called.  Conscious sedation was administered with the use of IV fentanyl and Versed under continuous physician and nurse monitoring.  Heart rate, blood pressure, and oxygen saturation were continuously monitored.  Total sedation time was 52 minutes.  Ultrasound was used to evaluate the left common femoral artery.  It was patent .  A digital ultrasound image was acquired.  A micropuncture needle was used to access the left common femoral artery under ultrasound guidance.  An 018 wire was advanced without resistance and a micropuncture sheath was placed.  The 018 wire was removed and a benson wire was placed.  The micropuncture sheath was exchanged for a 6 French 45 cm Terumo sheath.  A versa core wire was advanced over the aortic bifurcation which was followed by the 6 French sheath which was placed into the right superficial femoral artery.  The patient was then fully heparinized.  Next, right leg runoff was performed Findings:  Right Lower Extremity: Right common femoral and profundofemoral artery widely patent.  The superficial femoral artery is patent however the proximal third of the artery has luminal narrowing draining then 50%.  At the adductor canal there is a short  segment lesion which is heavily calcified with greater than 70% stenosis.  The anterior tibial is the dominant runoff vessel.  Intervention: At this point, the patient was fully heparinized.  I advanced a 014 wire across the lesions into the below-knee popliteal artery.  I selected a 4 x 40 shockwave lithotripsy balloon and perform balloon angioplasty with lithotripsy in the superficial femoral artery for lesion length of approximately 200 cm.  Once this was completed at 2 and 4 atm on the balloon, a Ranger 4 x 200 balloon was inserted and drug-coated balloon angioplasty was performed for 3 minutes.  Completion imaging showed inline flow with residual stenosis less than 10%.  There was a nonflow limiting dissection at the proximal and distal edge of the intervention which I elected to monitor.  I did not want to put a stent in because of the small size of her arteries.  At this point sheath and wire were removed.  The patient particularly here for sheath electrocoagulation profile correct Impression:  #1 successful shockwave intra-arterial lithotripsy and subsequent drug-coated balloon angioplasty using 4 mm balloons to the right superficial femoral artery with minimal residual stenosis.  There was a nonflow limiting dissection that was not intervened on.  Theotis Burrow, M.D., [facility] [place] Office: [phone] Pager:  [phone]  IR Removal Tun Cv Cath W/O FL  Result Date: 07/14/2019 INDICATION: End-stage renal disease on hemodialysis. Now with functional arteriovenous fistula. Request for removal hemodialysis catheter which was placed by vascular surgery. EXAM: REMOVAL OF TUNNELED HEMODIALYSIS CATHETER MEDICATIONS: 1% lidocaine 3 mL COMPLICATIONS: None immediate. PROCEDURE: Informed written consent was obtained from the patient following an explanation of the procedure, risks, benefits and alternatives to treatment. A time out was performed prior to the initiation of the  procedure. Maximal barrier sterile technique was utilized including caps, mask, sterile gowns, sterile gloves, large sterile drape, hand hygiene, and ChloraPrep. 1% lidocaine with epinephrine was injected under sterile conditions along the subcutaneous tunnel. Utilizing a combination of blunt dissection and gentle traction, the catheter was removed intact. Hemostasis  was obtained with manual compression. A dressing was placed. The patient tolerated the procedure well without immediate post procedural complication. IMPRESSION: Successful removal of tunneled dialysis catheter. Read by: Gareth Eagle, PA-C Electronically Signed   By: Lucrezia Europe M.D.   On: 07/14/2019 11:51   DG CHEST PORT 1 VIEW  Result Date: 08/08/2019 CLINICAL DATA:  69 year old female with chest pain. EXAM: PORTABLE CHEST 1 VIEW COMPARISON:  Chest radiograph dated 08/06/2019. FINDINGS: Left subclavian central venous line with tip close to the cavoatrial junction similar to prior radiograph. Small bilateral pleural effusions, right greater than left with bibasilar atelectasis or infiltrate. Slight improvement in aeration of the lungs and edema compared to prior radiograph. No pneumothorax. Stable cardiomegaly. Coronary vascular calcification and stent. Atherosclerotic calcification of the aorta. No acute osseous pathology. Osteopenia. IMPRESSION: Small bilateral pleural effusions with bibasilar atelectasis or infiltrate. Slightly improved aeration of the lungs. Electronically Signed   By: Anner Crete M.D.   On: 08/08/2019 02:33   DG CHEST PORT 1 VIEW  Result Date: 08/06/2019 CLINICAL DATA:  Follow-up pleural effusion EXAM: PORTABLE CHEST 1 VIEW COMPARISON:  Four days ago FINDINGS: Extubation of the trachea and esophagus. Left subclavian line with tip in good position. Interstitial opacity and probable layering pleural fluid which is increased. There is also hazy airspace disease. No pneumothorax. Cardiomegaly and vascular pedicle widening.  IMPRESSION: Edema and pleural effusions which have progressed from 4 days ago. Electronically Signed   By: Monte Fantasia M.D.   On: 08/06/2019 08:56   DG CHEST PORT 1 VIEW  Result Date: 08/02/2019 CLINICAL DATA:  Endotracheal tube placement. EXAM: PORTABLE CHEST 1 VIEW COMPARISON:  Earlier this day. FINDINGS: Endotracheal tube tip 4.8 cm from the carina. Tip and side port of the enteric tube below the diaphragm in the stomach. Left subclavian central line unchanged in the SVC. Stable cardiomegaly. Unchanged mediastinal contours with aortic atherosclerosis. Interstitial thickening suspicious for pulmonary edema, similar. Minimal retrocardiac opacity and blunting of the costophrenic angle. There is no pneumothorax. IMPRESSION: 1. Endotracheal tube tip 4.8 cm from the carina. Enteric tube in place. 2. Unchanged cardiomegaly and interstitial opacities suspicious for pulmonary edema. Possible small left pleural effusion. Electronically Signed   By: Keith Rake M.D.   On: 08/02/2019 18:59   DG Chest Port 1 View  Result Date: 08/01/2019 CLINICAL DATA:  Intubation.  Assess for pneumothorax. EXAM: PORTABLE CHEST 1 VIEW COMPARISON:  August 01, 2019 7:58 a.m. FINDINGS: Endotracheal tube is identified 5.2 cm from carina. There is no pleural line to suggest pneumothorax. Nasogastric tube is identified distal tip not included on film but is at least in the stomach. Mild diffuse increased interstitium is identified bilaterally. Patchy opacities of both lung bases are noted. The heart size is mildly enlarged. The bony structures are stable. IMPRESSION: 1. Endotracheal tube 5.2 cm from carina. No pneumothorax. 2. Mild pulmonary edema. 3. Patchy opacities of both lung bases, superimposed pneumonia is not excluded. Electronically Signed   By: Abelardo Diesel M.D.   On: 08/01/2019 13:05   DG Humerus Right  Result Date: 08/04/2019 CLINICAL DATA:  Right-sided weakness history of fall EXAM: RIGHT HUMERUS - 2+ VIEW COMPARISON:   None. FINDINGS: There is no evidence of fracture or other focal bone lesions. Soft tissues are unremarkable. IMPRESSION: Negative. Electronically Signed   By: Donavan Foil M.D.   On: 08/04/2019 19:31   ECHOCARDIOGRAM COMPLETE  Result Date: 08/01/2019    ECHOCARDIOGRAM REPORT   Patient Name:   MALITA IGNASIAK  Hibler Date of Exam: 08/01/2019 Medical Rec #:  [id number]          Height:       62.0 in Accession #:    [id number]         Weight:       89.3 lb Date of Birth:  28-Jan-1950           BSA:          1.357 m Patient Age:    79 years           BP:           153/69 mmHg Patient Gender: F                  HR:           49 bpm. Exam Location:  Inpatient Procedure: 2D Echo, Cardiac Doppler and Color Doppler Indications:    Cardiac arrest ([place]) [427.5.ICD-9-CM]  History:        Patient has prior history of Echocardiogram examinations, most                 recent 06/20/2019. CAD, Stroke and COPD, Arrythmias:Atrial                 Fibrillation, Signs/Symptoms:Chest Pain; Risk                 Factors:Hypertension, Dyslipidemia and Former Smoker.  Sonographer:    Vickie Epley RDCS Referring Phys: [id number] GRACE E BOWSER  Sonographer Comments: Echo performed with patient supine and on artificial respirator. IMPRESSIONS  1. Left ventricular ejection fraction, by estimation, is 55 to 60%. The left ventricle has normal function. The left ventricle has no regional wall motion abnormalities. There is the interventricular septum is mildly flattened in systole and diastole, consistent with right ventricular pressure and volume overload.  2. Right ventricular systolic function is mildly reduced. The right ventricular size is normal. There is severely elevated pulmonary artery systolic pressure. The estimated right ventricular systolic pressure is 82.8 mmHg.  3. Left atrial size was mildly dilated.  4. The mitral valve is abnormal. Mild mitral valve regurgitation.  5. Tricuspid valve regurgitation is mild to moderate.  6. The aortic valve  is tricuspid. Aortic valve regurgitation is trivial. Mild aortic valve sclerosis is present, with no evidence of aortic valve stenosis.  7. The inferior vena cava is normal in size with <50% respiratory variability, suggesting right atrial pressure of 8 mmHg. FINDINGS  Left Ventricle: Left ventricular ejection fraction, by estimation, is 55 to 60%. The left ventricle has normal function. The left ventricle has no regional wall motion abnormalities. The left ventricular internal cavity size was normal in size. There is  borderline left ventricular hypertrophy. The interventricular septum is flattened in systole and diastole, consistent with right ventricular pressure and volume overload. Left ventricular diastolic parameters are consistent with Grade I diastolic dysfunction (impaired relaxation). Right Ventricle: The right ventricular size is normal. No increase in right ventricular wall thickness. Right ventricular systolic function is mildly reduced. There is severely elevated pulmonary artery systolic pressure. The tricuspid regurgitant velocity is 3.67 m/s, and with an assumed right atrial pressure of 8 mmHg, the estimated right ventricular systolic pressure is 00.3 mmHg. Left Atrium: Left atrial size was mildly dilated. Right Atrium: Right atrial size was normal in size. Pericardium: There is no evidence of pericardial effusion. Mitral Valve: The mitral valve is abnormal. There is mild thickening of the mitral valve leaflet(s). There is mild calcification  of the mitral valve leaflet(s). Mild to moderate mitral annular calcification. Mild mitral valve regurgitation. Tricuspid Valve: The tricuspid valve is grossly normal. Tricuspid valve regurgitation is mild to moderate. Aortic Valve: The aortic valve is tricuspid. Aortic valve regurgitation is trivial. Mild aortic valve sclerosis is present, with no evidence of aortic valve stenosis. Mild aortic valve annular calcification. Pulmonic Valve: The pulmonic valve was  grossly normal. Pulmonic valve regurgitation is trivial. Aorta: The aortic root is normal in size and structure. Venous: The inferior vena cava is normal in size with less than 50% respiratory variability, suggesting right atrial pressure of 8 mmHg. IAS/Shunts: No atrial level shunt detected by color flow Doppler.  LEFT VENTRICLE PLAX 2D LVIDd:         4.20 cm     Diastology LVIDs:         2.70 cm     LV e' lateral:   4.95 cm/s LV PW:         1.00 cm     LV E/e' lateral: 16.0 LV IVS:        1.00 cm     LV e' medial:    3.09 cm/s LVOT diam:     1.50 cm     LV E/e' medial:  25.6 LV SV:         40 LV SV Index:   29 LVOT Area:     1.77 cm  LV Volumes (MOD) LV vol d, MOD A2C: 74.2 ml LV vol d, MOD A4C: 99.4 ml LV vol s, MOD A2C: 38.1 ml LV vol s, MOD A4C: 50.3 ml LV SV MOD A2C:     36.1 ml LV SV MOD A4C:     99.4 ml LV SV MOD BP:      43.9 ml RIGHT VENTRICLE RV S prime:     6.58 cm/s TAPSE (M-mode): 1.1 cm LEFT ATRIUM             Index       RIGHT ATRIUM           Index LA diam:        4.70 cm 3.46 cm/m  RA Area:     15.30 cm LA Vol (A2C):   47.7 ml 35.15 ml/m RA Volume:   39.10 ml  28.81 ml/m LA Vol (A4C):   50.5 ml 37.22 ml/m LA Biplane Vol: 49.4 ml 36.41 ml/m  AORTIC VALVE LVOT Vmax:   110.00 cm/s LVOT Vmean:  68.000 cm/s LVOT VTI:    0.224 m  AORTA Ao Root diam: 2.90 cm MITRAL VALVE               TRICUSPID VALVE MV Area (PHT): 2.26 cm    TR Peak grad:   53.9 mmHg MV Decel Time: 335 msec    TR Vmax:        367.00 cm/s MV E velocity: 79.20 cm/s MV A velocity: 93.20 cm/s  SHUNTS MV E/A ratio:  0.85        Systemic VTI:  0.22 m                            Systemic Diam: 1.50 cm Rozann Lesches MD Electronically signed by Rozann Lesches MD Signature Date/Time: 08/01/2019/5:36:32 PM    Final    DG FEMUR, MIN 2 VIEWS RIGHT  Result Date: 08/04/2019 CLINICAL DATA:  Fall, right-sided weakness EXAM: RIGHT FEMUR 2 VIEWS COMPARISON:  None. FINDINGS: No fracture or  malalignment. Vascular calcifications. Edema within the  soft tissues. IMPRESSION: No acute osseous abnormality. Electronically Signed   By: Donavan Foil M.D.   On: 08/04/2019 19:34   VAS [place] LOWER EXTREMITY VENOUS (DVT)  Result Date: 08/05/2019  Lower Venous DVTStudy Indications: Swelling.  Comparison Study: no prior Performing Technologist: Abram Sander RVS  Examination Guidelines: A complete evaluation includes B-mode imaging, spectral Doppler, color Doppler, and power Doppler as needed of all accessible portions of each vessel. Bilateral testing is considered an integral part of a complete examination. Limited examinations for reoccurring indications may be performed as noted. The reflux portion of the exam is performed with the patient in reverse Trendelenburg.  +---------+---------------+---------+-----------+----------+-----------------+ RIGHT    CompressibilityPhasicitySpontaneityPropertiesThrombus Aging    +---------+---------------+---------+-----------+----------+-----------------+ CFV      Partial        Yes      Yes                  Age Indeterminate +---------+---------------+---------+-----------+----------+-----------------+ SFJ      None                                         Age Indeterminate +---------+---------------+---------+-----------+----------+-----------------+ FV Prox  None                                         Age Indeterminate +---------+---------------+---------+-----------+----------+-----------------+ FV Mid   None                                         Age Indeterminate +---------+---------------+---------+-----------+----------+-----------------+ FV DistalNone                                         Age Indeterminate +---------+---------------+---------+-----------+----------+-----------------+ PFV      None                                         Age Indeterminate +---------+---------------+---------+-----------+----------+-----------------+ POP      None           No       No                    Age Indeterminate +---------+---------------+---------+-----------+----------+-----------------+ PTV      None                                         Age Indeterminate +---------+---------------+---------+-----------+----------+-----------------+ PERO     None                                         Age Indeterminate +---------+---------------+---------+-----------+----------+-----------------+ GSV      None                                                           +---------+---------------+---------+-----------+----------+-----------------+  EIV                     Yes      Yes                                    +---------+---------------+---------+-----------+----------+-----------------+   +----+---------------+---------+-----------+----------+--------------+ LEFTCompressibilityPhasicitySpontaneityPropertiesThrombus Aging +----+---------------+---------+-----------+----------+--------------+ CFV Full           Yes      Yes                                 +----+---------------+---------+-----------+----------+--------------+     Summary: RIGHT: - Findings consistent with age indeterminate deep vein thrombosis involving the right common femoral vein, SF junction, right femoral vein, right proximal profunda vein, right popliteal vein, and right posterior tibial veins. - Findings consistent with age indeterminate superficial vein thrombosis involving the right great saphenous vein.   *See table(s) above for measurements and observations. Electronically signed by Harold Barban MD on 08/05/2019 at 5:18:17 PM.    Final    VAS [place] UPPER EXTREMITY VENOUS DUPLEX  Result Date: 08/05/2019 UPPER VENOUS STUDY  Indications: Swelling Comparison Study: no prior Performing Technologist: Abram Sander RVS  Examination Guidelines: A complete evaluation includes B-mode imaging, spectral Doppler, color Doppler, and power Doppler as needed of all accessible portions of each  vessel. Bilateral testing is considered an integral part of a complete examination. Limited examinations for reoccurring indications may be performed as noted.  Right Findings: +----------+------------+---------+-----------+----------+-------+ RIGHT     CompressiblePhasicitySpontaneousPropertiesSummary +----------+------------+---------+-----------+----------+-------+ IJV           Full       Yes       Yes                      +----------+------------+---------+-----------+----------+-------+ Subclavian    Full       Yes       Yes                      +----------+------------+---------+-----------+----------+-------+ Axillary      Full       Yes       Yes                      +----------+------------+---------+-----------+----------+-------+ Brachial      Full       Yes       Yes                      +----------+------------+---------+-----------+----------+-------+ Radial        Full                                          +----------+------------+---------+-----------+----------+-------+ Ulnar         Full                                          +----------+------------+---------+-----------+----------+-------+ Cephalic      Full                                          +----------+------------+---------+-----------+----------+-------+  Basilic       Full                                          +----------+------------+---------+-----------+----------+-------+  Summary:  Right: No evidence of deep vein thrombosis in the upper extremity. No evidence of superficial vein thrombosis in the upper extremity.  *See table(s) above for measurements and observations.  Diagnosing physician: Harold Barban MD Electronically signed by Harold Barban MD on 08/05/2019 at 5:17:35 PM.    Final     Time Spent in minutes  30   Lala Lund M.D on 08/09/2019 at 9:23 AM  To page go to [email or web address] - password [facility]

## 2019-08-09 NOTE — Progress Notes (Signed)
STAT EEG complete - results pending. ? ?

## 2019-08-09 NOTE — Progress Notes (Addendum)
Inpatient Rehabilitation Admissions Coordinator  I met with patient at bedside and left another voicemail for daughter, [name], to contact me. Further review of previous admits and feel patient better served by SNF with likely long term placement needed due to her multiple medical issues. I will notify TOC team and we will sign off at this time.  Danne Baxter, RN, MSN Rehab Admissions Coordinator [phone] 08/09/2019 2:14 PM  I spoke with daughter, [name], by phone and she is aware of our SNF recommendation.

## 2019-08-09 NOTE — Progress Notes (Signed)
Notified Dr. Candiss Norse pt is having increasing ataxia on left side. Will not track finger movement to the left when prompted to and appears to be more confused. New orders given. Carroll Kinds RN

## 2019-08-09 NOTE — Progress Notes (Signed)
Hemodialysis initiated via L AVF using 15g needles x2. Access is bruised and swollen from previous infiltration. Discussed importance of keeping access arm still. All labs drawn and orders reviewed.

## 2019-08-09 NOTE — Progress Notes (Signed)
Patient calls out frequently. When asked what is needed patient tells me "im talking to the Ness."

## 2019-08-09 NOTE — Procedures (Signed)
Patient Name: Christina [name]  MRN: [id number]  Epilepsy Attending: Lora Havens  Referring Physician/Provider: Dr Lala Lund Date: 08/09/2019 Duration: 24.7 mins  Patient history: 69 yo F with SDH and multifocal acute infarcts. EEG to evaluate for seizure.  Level of alertness: lethargic  AEDs during EEG study: Gabapentin  Technical aspects: This EEG study was done with scalp electrodes positioned according to the 10-20 International system of electrode placement. Electrical activity was acquired at a sampling rate of 500Hz  and reviewed with a high frequency filter of 70Hz  and a low frequency filter of 1Hz . EEG data were recorded continuously and digitally stored.   Description: EEG showed continuous generalized 3 to 6 Hz theta-delta slowing. Hyperventilation and photic stimulation were not performed.     ABNORMALITY -Continuous slow, generalized  IMPRESSION: This study is suggestive of moderate diffuse encephalopathy, nonspecific etiology. No seizures or epileptiform discharges were seen throughout the recording.  Christina [name] [name]

## 2019-08-09 NOTE — TOC CAGE-AID Note (Signed)
Transition of Care [facility]) - CAGE-AID Screening   Patient Details  Name: Christina [name] MRN: [id number] Date of Birth: [birth year]  Transition of Care [facility]) CM/SW Contact:    Emeterio Reeve, [place] Phone Number: 08/09/2019, 4:09 PM   Clinical Narrative:  Pt denied alcohol use and substance use.   CAGE-AID Screening:    Have You Ever Felt You Ought to Cut Down on Your Drinking or Drug Use?: No   Have You Felt Bad Or Guilty About Your Drinking Or Drug Use?: No Have You Ever Had a Drink or Used Drugs First Thing In The Morning to Steady Your Nerves or to Get Rid of a Hangover?: No    Substance Abuse Education Offered: Yes  Blima Ledger, [place] Social Worker [phone]

## 2019-08-09 NOTE — Progress Notes (Signed)
Discussed order for CVC removal with RN; pt will need PIV if CVC removed. No appropriate PIV site found due to AVF on Left arm and edema/bruising covering right arm. RN notified. Recommend keeping CVC at this time if IV treatment needed due to limited veinous access.

## 2019-08-09 NOTE — Progress Notes (Signed)
Pt unavailable for EEG at this time RN will call when pt is available

## 2019-08-09 NOTE — Progress Notes (Signed)
PT Cancellation Note  Patient Details Name: Christina [name] MRN: [id number] DOB: 1950/02/14   Cancelled Treatment:    Reason Eval/Treat Not Completed: (P) Patient at procedure or test/unavailable Pt is off floor at HD. PT will attempt to follow back for treatment this afternoon as able.  Samaia Iwata B. Migdalia Dk PT, DPT Acute Rehabilitation Services Pager [phone] Office [phone]    [place] 08/09/2019, 8:29 AM

## 2019-08-09 NOTE — Progress Notes (Signed)
[facility] Progress Note   Subjective: Seen on HD. Tolerating well. Resting quietly without issues.   Objective Vitals:   08/09/19 0830 08/09/19 0854 08/09/19 0900 08/09/19 0915  BP: 134/66 (!) 153/73 116/61 (!) 148/65  Pulse: 82 84 86 82  Resp: _0 Temp:      TempSrc:      SpO2: 94% 95%    Weight:      Height:       Physical Exam General: Chronically ill appearing female in NAD Heart: S1,S2 RRR Lungs: decreased in bases few bibasilar crackles. No WOB. Respirations shallow, no tachypnea Abdomen: Active BS Extremities: No LE edema. Feet mottled plantar surface of both feet under toes-multiple necrotic tips of toes.  Dialysis Access: L AVF blood lines connected.    Additional Objective Labs: Basic Metabolic Panel: Recent Labs  Lab 08/02/19 1845 08/03/19 0346 08/03/19 1621 08/03/19 2033 08/07/19 0333 08/08/19 0541 08/09/19 0500  NA  --  141  --    < > 136 133* 135  K  --  3.3*  --    < > 4.4 4.6 4.8  CL  --  94*  --    < > 98 97* 96*  CO2  --  34*  --    < > _1 GLUCOSE  --  166*  --    < > 138* 175* 85  BUN  --  43*  --    < > 37* 54* 68*  CREATININE  --  4.41*  --    < > 4.01* 5.29* 5.96*  CALCIUM  --  7.1*  --    < > 7.4* 8.1* 8.5*  PHOS 5.9* 6.3* 2.1*  --   --   --   --    < > = values in this interval not displayed.   Liver Function Tests: Recent Labs  Lab 08/03/19 0346 08/05/19 0500 08/06/19 0540  AST 305* 62* 35  ALT 446* 222* 145*  ALKPHOS 73 86 75  BILITOT 0.8 1.0 1.1  PROT 3.9* 5.0* 4.5*  ALBUMIN 1.7* 2.1* 2.0*   No results for input(s): LIPASE, AMYLASE in the last 168 hours. CBC: Recent Labs  Lab 08/05/19 0500 08/05/19 0500 08/06/19 0540 08/06/19 0540 08/07/19 0333 08/08/19 0541 08/09/19 0500  WBC 17.1*   < > 10.3   < > 7.9 7.6 8.5  NEUTROABS  --   --   --   --  6.2 6.1 6.9  HGB 7.7*   < > 7.3*   < > 8.8* 9.6* 9.6*  HCT 25.7*   < > 25.1*   < > 28.7* 30.8* 31.3*  MCV 98.5  --  98.8  --  92.9 94.2 92.9   PLT 129*   < > 133*   < > 94* 111* 140*   < > = values in this interval not displayed.   Blood Culture    Component Value Date/Time   SDES TRACHEAL ASPIRATE 08/01/2019 1559   SPECREQUEST Normal 08/01/2019 1559   CULT  08/01/2019 1559    FEW Consistent with normal respiratory flora. Performed at [facility], [place] [address] [place], [place] [address]    REPTSTATUS 08/04/2019 FINAL 08/01/2019 1559    Cardiac Enzymes: No results for input(s): CKTOTAL, CKMB, CKMBINDEX, TROPONINI in the last 168 hours. CBG: Recent Labs  Lab 08/07/19 0312 08/07/19 0412 08/07/19 0829 08/07/19 1231 08/07/19 1612  GLUCAP 129* 120* 84 92 178*   Iron  Studies: No results for input(s): IRON, TIBC, TRANSFERRIN, FERRITIN in the last 72 hours. _0 @ Studies/Results: DG CHEST PORT 1 VIEW  Result Date: 08/08/2019 CLINICAL DATA:  69 year old female with chest pain. EXAM: PORTABLE CHEST 1 VIEW COMPARISON:  Chest radiograph dated 08/06/2019. FINDINGS: Left subclavian central venous line with tip close to the cavoatrial junction similar to prior radiograph. Small bilateral pleural effusions, right greater than left with bibasilar atelectasis or infiltrate. Slight improvement in aeration of the lungs and edema compared to prior radiograph. No pneumothorax. Stable cardiomegaly. Coronary vascular calcification and stent. Atherosclerotic calcification of the aorta. No acute osseous pathology. Osteopenia. IMPRESSION: Small bilateral pleural effusions with bibasilar atelectasis or infiltrate. Slightly improved aeration of the lungs. Electronically Signed   By: Anner Crete M.D.   On: 08/08/2019 02:33   Medications: . sodium chloride    . sodium chloride    . ceFEPime (MAXIPIME) IV    . metronidazole 500 mg (08/09/19 0200)   . sodium chloride   Intravenous Once  . acetaminophen      . acetaminophen      . amiodarone  200 mg Oral BID  . amLODipine  10 mg Oral Daily  . arformoterol  15 mcg  Nebulization BID  . aspirin  81 mg Oral Daily  . atorvastatin  40 mg Oral Daily  . budesonide (PULMICORT) nebulizer solution  0.5 mg Nebulization BID  . carvedilol  6.25 mg Oral BID WC  . chlorhexidine gluconate (MEDLINE KIT)  15 mL Mouth Rinse BID  . Chlorhexidine Gluconate Cloth  6 each Topical Q0600  . Chlorhexidine Gluconate Cloth  6 each Topical Q0600  . Chlorhexidine Gluconate Cloth  6 each Topical Q0600  . Darbepoetin Alfa      . darbepoetin (ARANESP) injection - DIALYSIS  60 mcg Intravenous Q Tue-HD  . feeding supplement (NEPRO CARB STEADY)  237 mL Oral TID BM  . gabapentin  300 mg Oral BID  . heparin injection (subcutaneous)  5,000 Units Subcutaneous Q8H  . mouth rinse  15 mL Mouth Rinse BID  . multivitamin  1 tablet Oral QHS  . neomycin-bacitracin-polymyxin   Topical BID  . pantoprazole  40 mg Oral BID  . sodium chloride flush  10-40 mL Intracatheter Q12H     OP GU:[facility]KY[facility] MWF 4h 40kg 2/2 bath Hep none LUE AVF mircera 200 ug last 6/30 venofer 100 tiw calc 0.75 tiw  Summary: Pt is a69 y.o.yo femalewith h/o HTN, CAD s/p stent, A. fib, anxiety depression, anemia, SBO, mesenteric ischemia s/p SMA stenting, multiple recent admissions,[facility] arrest seen as a consultation for the management of ESRD.  Assessment/ Plan: 1. Cardiac arrest/V. tach: Probably contributed by hyperkalemia. Stable post arrest.  2. Acute/ subacute LE DVT: IVC filter placed in IR 08/05/2019 .  3. ESRD: MWF HD. AV fistula for the access. Last HD 08/06/2019. HD today to get back on MWF schedule. K+ 4.6. No heparin.  4. HTN/ volume overload:diffuse edema last week-was up 9kg over dry wt. Got 3.4 L off last HD. Post wt  49.9 kg today. Continue lowering volume as tolerated. 5. Anemia of ESRD: HGB 9.6 today, upward trend. No ESA since admission. Give Aranesp 60 mcg IV with HD tomorrow. Check Iron panel.   6. Metabolic Bone Disease:Resume binders when able to take orally.Apparently phosphorus was low yesterday. Monitor lab. 7. CT head showed subdural hematoma. Avoid heparin. Repeat CT of head scheduled 08/11/2019. If SDH stable,  possibly resume plavix.  8. H/O PAF-not a candidate for anticoagulation BEFORE SDH D/T compliance issues. Per primary. 9. GOC-currently DNR 10. Disposition: Poor adherence with outpatient dialysis. Did better when she was in SNF with HD attendance. Would recommend DC to SNF again if possible.   Shelton Soler H. Tristram Milian NP-C 08/09/2019, 9:36 AM  [employer] [phone]

## 2019-08-10 ENCOUNTER — Inpatient Hospital Stay (HOSPITAL_COMMUNITY): Payer: Medicare Other

## 2019-08-10 LAB — CBC WITH DIFFERENTIAL/PLATELET
Abs Immature Granulocytes: 0.07 10*3/uL (ref 0.00–0.07)
Basophils Absolute: 0 10*3/uL (ref 0.0–0.1)
Basophils Relative: 0 %
Eosinophils Absolute: 0.2 10*3/uL (ref 0.0–0.5)
Eosinophils Relative: 2 %
HCT: 28.1 % — ABNORMAL LOW (ref 36.0–46.0)
Hemoglobin: 8.3 g/dL — ABNORMAL LOW (ref 12.0–15.0)
Immature Granulocytes: 1 %
Lymphocytes Relative: 8 %
Lymphs Abs: 0.6 10*3/uL — ABNORMAL LOW (ref 0.7–4.0)
MCH: 28.3 pg (ref 26.0–34.0)
MCHC: 29.5 g/dL — ABNORMAL LOW (ref 30.0–36.0)
MCV: 95.9 fL (ref 80.0–100.0)
Monocytes Absolute: 0.9 10*3/uL (ref 0.1–1.0)
Monocytes Relative: 12 %
Neutro Abs: 6.3 10*3/uL (ref 1.7–7.7)
Neutrophils Relative %: 77 %
Platelets: 156 10*3/uL (ref 150–400)
RBC: 2.93 MIL/uL — ABNORMAL LOW (ref 3.87–5.11)
RDW: 18.4 % — ABNORMAL HIGH (ref 11.5–15.5)
WBC: 8.2 10*3/uL (ref 4.0–10.5)
nRBC: 0 % (ref 0.0–0.2)

## 2019-08-10 LAB — BASIC METABOLIC PANEL
Anion gap: 10 (ref 5–15)
BUN: 37 mg/dL — ABNORMAL HIGH (ref 8–23)
CO2: 27 mmol/L (ref 22–32)
Calcium: 8.1 mg/dL — ABNORMAL LOW (ref 8.9–10.3)
Chloride: 96 mmol/L — ABNORMAL LOW (ref 98–111)
Creatinine, Ser: 3.68 mg/dL — ABNORMAL HIGH (ref 0.44–1.00)
GFR calc Af Amer: 14 mL/min — ABNORMAL LOW (ref >60)
GFR calc non Af Amer: 12 mL/min — ABNORMAL LOW (ref >60)
Glucose, Bld: 86 mg/dL (ref 70–99)
Potassium: 3.8 mmol/L (ref 3.5–5.1)
Sodium: 133 mmol/L — ABNORMAL LOW (ref 135–145)

## 2019-08-10 LAB — RENAL FUNCTION PANEL
Albumin: 1.6 g/dL — ABNORMAL LOW (ref 3.5–5.0)
Anion gap: 10 (ref 5–15)
BUN: 39 mg/dL — ABNORMAL HIGH (ref 8–23)
CO2: 27 mmol/L (ref 22–32)
Calcium: 8.3 mg/dL — ABNORMAL LOW (ref 8.9–10.3)
Chloride: 98 mmol/L (ref 98–111)
Creatinine, Ser: 4.06 mg/dL — ABNORMAL HIGH (ref 0.44–1.00)
GFR calc Af Amer: 12 mL/min — ABNORMAL LOW (ref >60)
GFR calc non Af Amer: 11 mL/min — ABNORMAL LOW (ref >60)
Glucose, Bld: 186 mg/dL — ABNORMAL HIGH (ref 70–99)
Phosphorus: 5.1 mg/dL — ABNORMAL HIGH (ref 2.5–4.6)
Potassium: 4.1 mmol/L (ref 3.5–5.1)
Sodium: 135 mmol/L (ref 135–145)

## 2019-08-10 LAB — INSULIN, RANDOM: Insulin: 6.9 u[IU]/mL (ref 2.6–24.9)

## 2019-08-10 LAB — C-PEPTIDE: C-Peptide: 20 ng/mL — ABNORMAL HIGH (ref 1.1–4.4)

## 2019-08-10 MED ORDER — NITROGLYCERIN 0.4 MG SL SUBL
SUBLINGUAL_TABLET | SUBLINGUAL | Status: AC
  Filled 2019-08-10: qty 1

## 2019-08-10 MED ORDER — NITROGLYCERIN 0.4 MG SL SUBL
0.4000 mg | SUBLINGUAL_TABLET | SUBLINGUAL | Status: DC | PRN
  Administered 2019-08-10: 0.4 mg via SUBLINGUAL

## 2019-08-10 NOTE — TOC Progression Note (Signed)
Transition of Care [facility]) - Progression Note    Patient Details  Name: BENEDETTA SUNDSTROM MRN: [id number] Date of Birth: 06/02/1950  Transition of Care [facility]) CM/SW [place], [place] Phone Number: 08/10/2019, 1:28 PM  Clinical Narrative:     CSW started insurance authorization for patient. Start date for insurance authorization is 7/14. Reference number is [id number].   Pending bed offers. Pending insurance authorization.  CSW will continue to follow.  Expected Discharge Plan: Skilled Nursing Facility Barriers to Discharge: Continued Medical Work up  Expected Discharge Plan and Services Expected Discharge Plan: [place]       Living arrangements for the past 2 months: Apartment                                       Social Determinants of Health (SDOH) Interventions    Readmission Risk Interventions Readmission Risk Prevention Plan 07/05/2019 06/17/2019 03/29/2019  Transportation Screening Complete Complete Complete  Medication Review Press photographer) Complete Complete Complete  PCP or Specialist appointment within 3-5 days of discharge Complete - -  [place] or Home Care Consult Complete Complete Complete  SW Recovery Care/Counseling Consult Complete Complete Complete  Palliative Care Screening Not Applicable Not Applicable Not Applicable  Skilled Nursing Facility Complete (No Data) Not Applicable  Some recent data might be hidden

## 2019-08-10 NOTE — Progress Notes (Signed)
Occupational Therapy Treatment Patient Details Name: Christina [name] MRN: [id number] DOB: [birth year] Today's Date: 08/10/2019    History of present illness Pt is 69 yo female transferred from [facility] 7/5 post asystolic arrest with 10 minute downtime. Hyperkalemia thought to be source of arrest.  Pt with acute metabolic encephalopathy s/p arrest with trace subdural hematoma along the falx noted 7/8. RLE DVT with IVC filter placed 7/9 as anticoagulation not an option. MRI of her brain showed:  numerous small acute infarcts involving multiple vascular territories bilaterally; advanced chronic microvascular ischemic changes and multiple chronic infarcts.  PMHx: HTN, HLD, CAD, CEA on L, SMA stent May 2021, CKD/ESRD, COPD, GERD, and GIB, polysubstance abuse including cocaine, medical non compliance    OT comments  Pt very limited this session by decreased cognition, decreased ability to care for self, increased tremors and decreased ability to move safely without assist. Pt requires minguardA to modA based on tremors for stability to sit EOB for ~5 mins. Pt with posterior lean and inability to safely stand. Pt would benefit from continued OT skilled services for ADL, mobility and safety. OT following acutely.   Follow Up Recommendations  CIR    Equipment Recommendations  Other (comment) (defer to next facility)    Recommendations for Other Services      Precautions / Restrictions Precautions Precautions: Fall Precaution Comments: limited activity tolerance Restrictions Weight Bearing Restrictions: No       Mobility Bed Mobility Overal bed mobility: Needs Assistance Bed Mobility: Supine to Sit;Sit to Supine     Supine to sit: Max assist Sit to supine: Total assist   General bed mobility comments: MaxA for transition to EOB, ataxic and tremors noted.  Transfers                 General transfer comment: unsafe today as pt very ataxic and tremors noted at EOB      Balance Overall balance assessment: Needs assistance Sitting-balance support: Feet supported;Single extremity supported Sitting balance-Leahy Scale: Fair Sitting balance - Comments: Pt requires minguardA to modA based on tremors for stability  Postural control: Posterior lean     Standing balance comment: unsafe today                           ADL either performed or assessed with clinical judgement   ADL Overall ADL's : Needs assistance/impaired                                     Functional mobility during ADLs: Moderate assistance General ADL Comments: Pt very limited this session by decreased cognition, decreased ability to care for self, increased tremors and decreased ability to move safely without assist.     Vision   Vision Assessment?: Vision impaired- to be further tested in functional context Additional Comments: continue to assess. Pt often looking past therapist with poor direct eye contact.   Perception     Praxis      Cognition Arousal/Alertness: Awake/alert Behavior During Therapy: WFL for tasks assessed/performed Overall Cognitive Status: Impaired/Different from baseline Area of Impairment: Attention;Memory;Following commands;Safety/judgement;Awareness                 Orientation Level: Disoriented to;Time;Place;Situation Current Attention Level: Selective;Sustained Memory: Decreased short-term memory Following Commands: Follows one step commands with increased time;Follows multi-step commands with increased time Safety/Judgement: Decreased awareness of safety;Decreased awareness of  deficits Awareness: Intellectual Problem Solving: Slow processing;Decreased initiation;Difficulty sequencing;Requires verbal cues;Requires tactile cues General Comments: pt requiring directions repeated often and pt unable to attend to tasks without staring into space or inability to understand meaning of command given. Pt asked to stare at name  badge, but pt stating "I don't know how to stare at anything.. I'm moving too much."        Exercises     Shoulder Instructions       General Comments Pt on 2L O2 >90%     Pertinent Vitals/ Pain       Pain Assessment: Faces Faces Pain Scale: Hurts little more Pain Location: chest/ribs, R LE Pain Descriptors / Indicators: Crushing;Aching;Sore Pain Intervention(s): Limited activity within patient's tolerance  Home Living                                          Prior Functioning/Environment              Frequency  Min 2X/week        Progress Toward Goals  OT Goals(current goals can now be found in the care plan section)  Progress towards OT goals: Progressing toward goals  Acute Rehab OT Goals Patient Stated Goal: Pt unable to state  OT Goal Formulation: With patient Time For Goal Achievement: 08/21/19 Potential to Achieve Goals: Good ADL Goals Pt Will Perform Grooming: with min assist;standing Pt Will Perform Upper Body Bathing: with min assist;sitting Pt Will Perform Lower Body Bathing: with mod assist;sit to/from stand Pt Will Perform Upper Body Dressing: with mod assist;sitting Pt Will Transfer to Toilet: with min assist;stand pivot transfer;bedside commode Pt Will Perform Toileting - Clothing Manipulation and hygiene: with mod assist;sit to/from stand  Plan Discharge plan remains appropriate    Co-evaluation                 AM-PAC OT "6 Clicks" Daily Activity     Outcome Measure   Help from another person eating meals?: A Little Help from another person taking care of personal grooming?: A Lot Help from another person toileting, which includes using toliet, bedpan, or urinal?: A Lot Help from another person bathing (including washing, rinsing, drying)?: A Lot Help from another person to put on and taking off regular upper body clothing?: A Lot Help from another person to put on and taking off regular lower body clothing?:  Total 6 Click Score: 12    End of Session Equipment Utilized During Treatment: Oxygen  OT Visit Diagnosis: Unsteadiness on feet (R26.81);Hemiplegia and hemiparesis Hemiplegia - Right/Left: Right Hemiplegia - dominant/non-dominant: Dominant Hemiplegia - caused by: Cerebral infarction   Activity Tolerance Treatment limited secondary to medical complications (Comment)   Patient Left in bed;with call bell/phone within reach;with bed alarm set   Nurse Communication Mobility status        Time: 0383-3383 OT Time Calculation (min): 16 min  Charges: OT General Charges $OT Visit: 1 Visit OT Treatments $Self Care/Home Management : 8-22 mins  Jefferey Pica, OTR/L Acute Rehabilitation Services Pager: [phone] Office: [phone]    Dymon Summerhill C 08/10/2019, 4:59 PM

## 2019-08-10 NOTE — Care Management Important Message (Signed)
Important Message  Patient Details  Name: Christina [name] MRN: [id number] Date of Birth: October 28, 1950   Medicare Important Message Given:  Yes     Shelda Altes 08/10/2019, 1:31 PM

## 2019-08-10 NOTE — Progress Notes (Signed)
STROKE TEAM PROGRESS NOTE   INTERVAL HISTORY No family at bedside. Pt neuro stable, unchanged from yesterday. Had CT showed small SDH stable with some improvement. MRI showed previous infarcts more prominent now but no significant new area strokes. However, her Hb today dropped from 9.6 to 8.3.   OBJECTIVE Vitals:   08/09/19 2114 08/10/19 0403 08/10/19 0732 08/10/19 0818  BP:  (!) 174/66  (!) 156/65  Pulse:  73  84  Resp: 15 16    Temp: 98.2 F (36.8 C) 98 F (36.7 C)    TempSrc: Oral Oral    SpO2:  96% 96%   Weight:  49.6 kg    Height:       CBC:  Recent Labs  Lab 08/09/19 0500 08/10/19 0500  WBC 8.5 8.2  NEUTROABS 6.9 6.3  HGB 9.6* 8.3*  HCT 31.3* 28.1*  MCV 92.9 95.9  PLT 140* 240   Basic Metabolic Panel:  Recent Labs  Lab 08/03/19 1621 08/03/19 2033 08/07/19 0333 08/07/19 0333 08/08/19 0541 08/08/19 0541 08/09/19 0500 08/10/19 0500  NA  --    < > 136   < > 133*   < > 135 133*  K  --    < > 4.4   < > 4.6   < > 4.8 3.8  CL  --    < > 98   < > 97*   < > 96* 96*  CO2  --    < > 27   < > 24   < > 24 27  GLUCOSE  --    < > 138*   < > 175*   < > 85 86  BUN  --    < > 37*   < > 54*   < > 68* 37*  CREATININE  --    < > 4.01*   < > 5.29*   < > 5.96* 3.68*  CALCIUM  --    < > 7.4*   < > 8.1*   < > 8.5* 8.1*  MG 1.7  --  1.6*  --  2.3  --   --   --   PHOS 2.1*  --   --   --   --   --   --   --    < > = values in this interval not displayed.    Lipid Panel:     Component Value Date/Time   CHOL 86 06/19/2019 0312   TRIG 92 06/19/2019 0312   HDL 37 (L) 06/19/2019 0312   CHOLHDL 2.3 06/19/2019 0312   VLDL 18 06/19/2019 0312   LDLCALC 31 06/19/2019 0312   HgbA1c:  Lab Results  Component Value Date   HGBA1C 4.4 (L) 08/09/2019   Urine Drug Screen:     Component Value Date/Time   LABOPIA NONE DETECTED 07/02/2019 0458   COCAINSCRNUR NONE DETECTED 07/02/2019 0458   LABBENZ NONE DETECTED 07/02/2019 0458   AMPHETMU NONE DETECTED 07/02/2019 0458   THCU NONE  DETECTED 07/02/2019 0458   LABBARB NONE DETECTED 07/02/2019 0458    Alcohol Level     Component Value Date/Time   ETH <10 04/23/2019 2314    IMAGING  CT HEAD WO CONTRAST 08/09/2019   IMPRESSION:  1. Stable small falcine subdural hematoma, decreased in prominence since prior study. No mass effect. 2. 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 No change since recent MRI. 3. No evidence of parenchymal  hemorrhage.   CT ANGIO HEAD W OR WO CONTRAST CT ANGIO NECK W OR WO CONTRAST 08/05/2019 IMPRESSION:  No substantial change since 06/19/2019. There is irregular noncalcified plaque along the aortic arch. Stable findings of stenosis including left common carotid artery, intracranial right vertebral artery, intracranial ICAs, and left A2 ACA. Persistent pleural effusions and associated atelectasis.  CT HEAD WO CONTRAST 08/05/2019   IMPRESSION:  No acute intracranial hemorrhage or evidence of acute infarction. Small new infarct difficult to exclude due to extensive chronic ischemic changes. Stable trace falcine subdural hematoma.   MR BRAIN WO CONTRAST 08/10/2019 IMPRESSION: 1. Multiple foci of restricted diffusion within the bilateral cerebral and cerebellar hemispheres, right thalamus and right caudate, less evident than on prior MRI, likely due to pseudo normalization in some of the lesions. The lesions in the bilateral cerebral hemisphere appear to be predominantly in watershed distribution. 2. Extensive confluent T2 hyperintensity of the white matter of the cerebral hemispheres and pons, nonspecific, most likely related to chronic small vessel ischemia. 3. 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 4. Bilateral mastoid effusion.  08/05/2019 IMPRESSION:  Numerous small acute infarcts involving multiple vascular territories bilaterally suggesting a central  embolic etiology. Advanced chronic microvascular ischemic changes and multiple chronic infarcts.   DG Forearm Right 08/04/2019 IMPRESSION:  No definitive fracture is seen, however there is an elbow effusion and occult fracture cannot be excluded.    DG Tibia/Fibula Right 08/04/2019 IMPRESSION:  No acute osseous abnormality.   IR IVC FILTER PLMT / S&I /IMG GUID/MOD SED 08/06/2019 IMPRESSION:  1. Normal IVC. No thrombus or significant anatomic variation.  2. Technically successful infrarenal IVC filter placement. This is a retrievable model.  PLAN:  This IVC filter is potentially retrievable. The patient will be assessed for filter retrieval by Interventional Radiology in approximately 8-12 weeks. Further recommendations regarding filter retrieval, continued surveillance or declaration of device permanence, will be made at that time.  DG CHEST PORT 1 VIEW 08/08/2019 IMPRESSION: Small bilateral pleural effusions with bibasilar atelectasis or infiltrate. Slightly improved aeration of the lungs.  08/06/2019 IMPRESSION:  Edema and pleural effusions which have progressed from 4 days ago.  DG Humerus Right 08/04/2019 IMPRESSION:  Negative.    DG FEMUR, MIN 2 VIEWS RIGHT  08/04/2019 IMPRESSION:  No acute osseous abnormality.   VAS [place] LOWER EXTREMITY VENOUS (DVT) 08/05/2019 Summary:  RIGHT: - Findings consistent with age indeterminate deep vein thrombosis involving the right common femoral vein, SF junction, right femoral vein, right proximal profunda vein, right popliteal vein, and right posterior tibial veins. - Findings consistent with age indeterminate superficial vein thrombosis involving the right great saphenous vein.     VAS [place] UPPER EXTREMITY VENOUS DUPLEX 08/05/2019 Summary:   Right: No evidence of deep vein thrombosis in the upper extremity. No evidence of superficial vein thrombosis in the upper extremity.    Transthoracic Echocardiogram  08/01/2019 IMPRESSIONS  1. Left  ventricular ejection fraction, by estimation, is 55 to 60%. The left ventricle has normal function. The left ventricle has no regional wall motion abnormalities. There is the interventricular septum is mildly flattened in systole and diastole, consistent with right ventricular pressure and volume overload.  2. Right ventricular systolic function is mildly reduced. The right ventricular size is normal. There is severely elevated pulmonary artery systolic pressure. The estimated right ventricular systolic pressure is 14.4 mmHg.  3. Left atrial size was mildly dilated.  4. The mitral valve is abnormal. Mild mitral valve regurgitation.  5.  Tricuspid valve regurgitation is mild to moderate.  6. The aortic valve is tricuspid. Aortic valve regurgitation is trivial. Mild aortic valve sclerosis is present, with no evidence of aortic valve stenosis.  7. The inferior vena cava is normal in size with <50% respiratory variability, suggesting right atrial pressure of 8 mmHg.   ECG - atrial fibrillation - ventricular response 140 BPM (See cardiology reading for complete details)  EEG 08/09/2019 This study is suggestive of moderate diffuse encephalopathy, nonspecific etiology. No seizures or epileptiform discharges were seen throughout the recording.  PHYSICAL EXAM    Temp:  [98 F (36.7 C)-99 F (37.2 C)] 98 F (36.7 C) (07/14 0403) Pulse Rate:  [73-107] 84 (07/14 0818) Resp:  [13-16] 16 (07/14 0403) BP: (113-183)/(64-76) 156/65 (07/14 0818) SpO2:  [95 %-99 %] 96 % (07/14 0732) Weight:  [47.8 kg-49.6 kg] 49.6 kg (07/14 0403)  General - Well nourished, well developed, lethargic.  Ophthalmologic - fundi not visualized due to noncooperation.  Cardiovascular - Regular rhythm and rate, not in afib.  Neuro - awake alert and eyes open but lethargic. Orientated to place and age and time. Mild dysarthria, but no aphasia and follows simple commands. Naming 2/3 and able to repeat. Visual field full, but  decreased visual acuity bilaterally. PERRL, no significant nystagmus seen today. PERRL. EOMI. Facial symmetric. Tongue midline. LUE 3+/5, RUE 3-/5 with drift. BLE 3+/5 proximal and 4/5 distally. Sensation symmetric subjectively, DTR 1+ and no babinski. L FTN mild ataxia, R FTN ataxic proportional to the weakness. Left UE asterixis. Gait not tested.   ASSESSMENT/PLAN Christina [name] is a 68 y.o. female with history of  ESRD (on dialysis T/Th/ Sa), HTN, HLD, A. Fib, COPD, hx of Gi bleed and CAD( 2 stents) who presented to [facility] on 7/5 with VT with a pulse in the ED -> cardioverted. She then went asystolic with 10 mins CPR before ROSC. K was > 7. She was extubated 7/7 and c/o right arm weakness and pain in right arm and leg.  She did not receive IV t-PA.  Stroke: multifocal, numerous small acute infarcts involving multiple vascular territories bilaterally suggesting a central embolic etiology - likely due to PAF not on [facility], cardiac arrest s/p CPR, as well as aortic arch mural thrombus or soft plaque  CT Head - 7/5 - trace acute SDH along the falx.  CT Head - 7/6 - Stable trace parafalcine SDH advanced chronic ischemia.  CT Head - 7/8 - Stable trace falcine subdural hematoma  CT head - 7/9 - No acute intracranial hemorrhage or evidence of acute infarction. Small new infarct difficult to exclude due to extensive chronic ischemic changes. Stable trace falcine subdural hematoma.   MRI head - Numerous small acute infarcts involving multiple vascular territories bilaterally suggesting a central embolic etiology. Advanced chronic microvascular ischemic changes and multiple chronic infarcts.   CTA H&N - No substantial change since 06/19/2019. There is irregular noncalcified plaque along the aortic arch. Stable findings of stenosis including left common carotid artery, intracranial right vertebral artery, intracranial ICAs, and left A2 ACA. Persistent pleural effusions and associated  atelectasis.  CT 7/13 stable SDH w/ decreased prominence. No change in multiple small B infarcts.   MRI repeat showed previous infarcts more prominent now but no significant new area strokes.  2D Echo - EF 55 - 60%. No cardiac source of emboli identified.   EEG generalized slowing, no sz  [employer] Virus 2 - negative  LDL - 31 (05/2019)  HgbA1c - 4.9 (05/2019)  UDS - negative  VTE prophylaxis - SCDs  aspirin 81 mg daily and clopidogrel 75 mg daily prior to admission, now on aspirin 81 mg daily. Pt not good AC candidate at this time due to persistent anemia and small SDH. Once SDH resolves, will recommend to resume plavix. Pt will follow up with Dr. Leonie Man at [facility] to consider [facility] once appropriate.  Therapy recommendations:  SNF  Disposition:  Pending  PAF  Currently SNR  On coreg  Not candidate for [facility] at this time  On ASA  Will need close follow up with Dr. 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 Leonie Man at [facility] to consider [facility] once appropriate  May repeat CTA in 2-3 month for monitoring  Cardiac arrest   VT in the setting of hyperkalemia s/p cardioversion  S/p CPR with ROSC  On coreg and ASA  Close monitoring  Management per primary team  Hypertension  Home BP meds: metoprolol ; imdur  Current BP meds: Coreg ; Norvasc  Stable . SBP goal < 160 mm Hg due to recent SDH . Long-term BP goal normotensive  Hyperlipidemia  Home Lipid lowering medication: Pravachol 80 mg daily  LDL 31, goal < 70  Current lipid lowering medication: Lipitor 40 mg daily   Continue statin at discharge  Other Stroke Risk Factors  Advanced age  Former cigarette smoker - quit  Previous ETOH    Coronary artery disease  Previous marijuana use  Previous strokes by imaging  Other Active Problems  Code status - DNR  ESRD on dialysis  Anemia of chronic disease - Hgb - 8.0->7.7->7.3->8.8->9.6->9.6->8.3  Thrombocytopenia, platelet 101->129->133->94->111->156  Hospital day # 9  Neurology will sign off. Please call with questions. Pt will follow up with stroke clinic Dr. Leonie Man at [facility] in about 4 weeks. Thanks for the consult.  Rosalin Hawking, MD PhD Stroke Neurology 08/10/2019 9:38 AM   To contact Stroke Continuity provider, please refer to [email or web address] After hours, contact General Neurology

## 2019-08-10 NOTE — Progress Notes (Addendum)
[facility] Progress Note   Subjective: Patient doesn't look well today.   Objective Vitals:   08/09/19 2114 08/10/19 0403 08/10/19 0732 08/10/19 0818  BP:  (!) 174/66  (!) 156/65  Pulse:  73  84  Resp: 15 16    Temp: 98.2 F (36.8 C) 98 F (36.7 C)    TempSrc: Oral Oral    SpO2:  96% 96%   Weight:  49.6 kg    Height:       Physical Exam General:Chronically ill appearing female in NAD Heart:S1,S2 RRR Lungs:decreased in bases few bibasilar crackles. Respirations shallow, no tachypnea.  Abdomen:Active BS Extremities:No LE edema. Feet mottled plantar surface of both feet under toes-multiple necrotic tips of toes.  Dialysis Access:L AVF +T/B   Additional Objective Labs: Basic Metabolic Panel: Recent Labs  Lab 08/03/19 1621 08/03/19 2033 08/08/19 0541 08/09/19 0500 08/10/19 0500  NA  --    < > 133* 135 133*  K  --    < > 4.6 4.8 3.8  CL  --    < > 97* 96* 96*  CO2  --    < > '24 24 27  ' GLUCOSE  --    < > 175* 85 86  BUN  --    < > 54* 68* 37*  CREATININE  --    < > 5.29* 5.96* 3.68*  CALCIUM  --    < > 8.1* 8.5* 8.1*  PHOS 2.1*  --   --   --   --    < > = values in this interval not displayed.   Liver Function Tests: Recent Labs  Lab 08/05/19 0500 08/06/19 0540  AST 62* 35  ALT 222* 145*  ALKPHOS 86 75  BILITOT 1.0 1.1  PROT 5.0* 4.5*  ALBUMIN 2.1* 2.0*   No results for input(s): LIPASE, AMYLASE in the last 168 hours. CBC: Recent Labs  Lab 08/06/19 0540 08/06/19 0540 08/07/19 0333 08/07/19 0333 08/08/19 0541 08/09/19 0500 08/10/19 0500  WBC 10.3   < > 7.9   < > 7.6 8.5 8.2  NEUTROABS  --   --  6.2   < > 6.1 6.9 6.3  HGB 7.3*   < > 8.8*   < > 9.6* 9.6* 8.3*  HCT 25.1*   < > 28.7*   < > 30.8* 31.3* 28.1*  MCV 98.8  --  92.9  --  94.2 92.9 95.9  PLT 133*   < > 94*   < > 111* 140* 156   < > = values in this interval not displayed.   Blood Culture    Component Value Date/Time   SDES TRACHEAL ASPIRATE 08/01/2019 1559    SPECREQUEST Normal 08/01/2019 1559   CULT  08/01/2019 1559    FEW Consistent with normal respiratory flora. Performed at [facility], [place] [address], [place], [place] [address]    REPTSTATUS 08/04/2019 FINAL 08/01/2019 1559    Cardiac Enzymes: No results for input(s): CKTOTAL, CKMB, CKMBINDEX, TROPONINI in the last 168 hours. CBG: Recent Labs  Lab 08/07/19 0412 08/07/19 0829 08/07/19 1231 08/07/19 1612 08/09/19 1714  GLUCAP 120* 84 92 178* 102*   Iron Studies: No results for input(s): IRON, TIBC, TRANSFERRIN, FERRITIN in the last 72 hours. '@[email or web address]' @ Studies/Results: CT HEAD WO CONTRAST  Result Date: 08/09/2019 CLINICAL DATA:  Asystolic arrest 33/00/7622, hyperkalemia, metabolic encephalopathy, history of subdural hematoma EXAM: CT HEAD WITHOUT CONTRAST TECHNIQUE: Contiguous axial images were obtained from the base of the skull  through the vertex without intravenous contrast. COMPARISON:  08/05/2019, 08/04/2019 FINDINGS: Brain: The trace falcine subdural hematoma is again identified with decreased prominence since prior study. No new areas of hemorrhage. Scattered hypodensities are seen within the bilateral cortex, bilateral basal ganglia, and bilateral cerebellar hemispheres, corresponding to the multifocal small infarcts seen on recent MRI. The lateral ventricles and midline structures are stable. No mass effect. Vascular: No hyperdense vessel or unexpected calcification. Skull: Normal. Negative for fracture or focal lesion. Sinuses/Orbits: No acute finding. Other: None. IMPRESSION: 1. Stable small falcine subdural hematoma, decreased in prominence since prior study. No mass effect. 2. 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 No change since recent MRI. 3. No evidence of parenchymal hemorrhage. Electronically Signed   By: Randa Ngo M.D.   On: 08/09/2019 17:35   EEG  adult  Result Date: 08/09/2019 Lora Havens, MD     08/09/2019  6:01 PM Patient Name: Christina [name] MRN: [id number] Epilepsy Attending: Lora Havens Referring Physician/Provider: Dr Lala Lund Date: 08/09/2019 Duration: 24.7 mins Patient history: 69 yo F with SDH and multifocal acute infarcts. EEG to evaluate for seizure. Level of alertness: lethargic AEDs during EEG study: Gabapentin Technical aspects: This EEG study was done with scalp electrodes positioned according to the 10-20 International system of electrode placement. Electrical activity was acquired at a sampling rate of '500Hz'  and reviewed with a high frequency filter of '70Hz'  and a low frequency filter of '1Hz' . EEG data were recorded continuously and digitally stored. Description: EEG showed continuous generalized 3 to 6 Hz theta-delta slowing. Hyperventilation and photic stimulation were not performed.   ABNORMALITY -Continuous slow, generalized IMPRESSION: This study is suggestive of moderate diffuse encephalopathy, nonspecific etiology. No seizures or epileptiform discharges were seen throughout the recording. Priyanka [name]   Medications: . metronidazole 500 mg (08/10/19 0504)   . sodium chloride   Intravenous Once  . amiodarone  200 mg Oral BID  . amLODipine  10 mg Oral Daily  . arformoterol  15 mcg Nebulization BID  . aspirin  81 mg Oral Daily  . atorvastatin  40 mg Oral Daily  . budesonide (PULMICORT) nebulizer solution  0.5 mg Nebulization BID  . carvedilol  6.25 mg Oral BID WC  . chlorhexidine gluconate (MEDLINE KIT)  15 mL Mouth Rinse BID  . Chlorhexidine Gluconate Cloth  6 each Topical Q0600  . Chlorhexidine Gluconate Cloth  6 each Topical Q0600  . Chlorhexidine Gluconate Cloth  6 each Topical Q0600  . darbepoetin (ARANESP) injection - DIALYSIS  60 mcg Intravenous Q Tue-HD  . feeding supplement (NEPRO CARB STEADY)  237 mL Oral TID BM  . gabapentin  300 mg Oral BID  . heparin injection (subcutaneous)  5,000  Units Subcutaneous Q8H  . mouth rinse  15 mL Mouth Rinse BID  . multivitamin  1 tablet Oral QHS  . neomycin-bacitracin-polymyxin   Topical BID  . pantoprazole  40 mg Oral BID  . sodium chloride flush  10-40 mL Intracatheter Q12H     OP JM:EQAS [facility] MWF 4h 40kg 2/2 bath Hep none LUE AVF mircera 200 ug last 6/30 venofer 100 tiw calc 0.75 tiw  Summary: Pt is a69 y.o.yo femalewith h/o HTN, CAD s/p stent, A. fib, anxiety depression, anemia, SBO, mesenteric ischemia s/p SMA stenting, multiple recent admissions,[facility] cardiac arrest seen as a consultation for the management of ESRD.  Assessment/ Plan: 1. Cardiac arrest/V. tach: Probably contributed by hyperkalemia. Stable post arrest but declining physically.  Pt is failing to thrive in long-term and short-term, will consult palliative care team. We would advise further aggressive care (includilng dialysis) will not improve quantity or quality of life and we should transition to comfort care at this time.  Have d/w primary md.  2. Acute/ subacute LE DVT:IVC filter placed in IR 08/05/2019.  3. ESRD: MWF HD. AV fistula for the access. Next HD tomorrow on schedule. K+ 3.8-use 4.0 K bath.  No heparin. 4. HTN/ volume overload:diffuse edemalast week-was up 9kg over dry wt. Post wt 47.8 kg 07/13. Still above EDW. Continue lowering volume as tolerated.  5. Anemia of ESRD:HGB 8.3 today, Rec'd Aranesp 60 mcg IV with HD 08/09/2019. Check Iron panel.  6. Metabolic Bone Disease:No recent PO4. RFP added to today's labs. Not on binders.  7. Subdural hematoma. Avoid heparin. Repeat CT of head scheduled 08/11/2019. If SDH stable, possibly resume plavix. EEG done 07/13 shows diffuse encephalopathy nonspecific etiology.  8. H/O PAF-not a candidate for anticoagulation BEFORE SDH D/T compliance issues. Per primary. 9. GOC-currently DNR. She is declining. Have recommended  hospice.   Rita H. Brown NP-C 08/10/2019, 9:36 AM  [facility] [phone]  Pt seen, examined and agree w A/P as above.  Kelly Splinter  MD 08/10/2019, 1:26 PM

## 2019-08-10 NOTE — Progress Notes (Signed)
Patient complained of chest pressure 9/10, BP 156/58, Pulse 68. EKG obtained.  Nitrostat SL x 1 given MD Adhikari notified  Chest pressure 0/10 after 1 SL nitro

## 2019-08-10 NOTE — [place] FL2 (Signed)
[place] MEDICAID FL2 LEVEL OF CARE SCREENING TOOL     IDENTIFICATION  Patient Name: Christina [name] Birthdate: Oct 20, 1950 Sex: female Admission Date (Current Location): 08/01/2019  [facility] and [place] Number:  Herbalist and Address:  [facility], [place] [address] [place], [place] [address]      Provider Number: [id number]  Attending Physician Name and Address:  Shelly Coss, MD  Relative Name and Phone Number:  [name] [phone]    Current Level of Care: Hospital Recommended Level of Care: [place] Prior Approval Number:    Date Approved/Denied:   PASRR Number: [id number]  Discharge Plan: SNF    Current Diagnoses: Patient Active Problem List   Diagnosis Date Noted  . Malnutrition of moderate degree 08/04/2019  . Medical non-compliance   . Cardiac arrest ([place]) 08/01/2019  . Melena   . History of duodenal ulcer   . Symptomatic anemia   . GI bleed 06/30/2019  . Cerebral embolism with cerebral infarction 06/19/2019  . Atrial fibrillation with rapid ventricular response ([place]) 06/14/2019  . Incomplete rectal prolapse 06/14/2019  . Respiratory failure with hypoxia ([place]) 05/10/2019  . Pneumonia 05/10/2019  . 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 Duodenal ulcer perforation ([place]) 04/23/2019  . Cocaine abuse ([place]) 04/23/2019  . Acute gastric ulcer without hemorrhage or perforation   . Hematemesis 04/04/2019  . Atrial fibrillation with RVR ([place])   . Duodenal ulcer with hemorrhage   . Gastritis and gastroduodenitis   . Acute blood loss anemia   . Ischemic bowel disease ([place]) 03/25/2019  . RUQ pain   . Anemia due to chronic kidney disease, on chronic dialysis ([place])   . AF (paroxysmal atrial fibrillation) ([place])   . Chest pain 03/19/2019  . Hyperkalemia 02/28/2019  . Uremia 02/27/2019  . Volume overload 02/21/2019  . Abdominal pain 02/21/2019  . Non-compliance with renal dialysis ([place])  02/21/2019  . Respiratory failure ([place]) 02/04/2019  . Acute and chr resp failure, unsp w hypoxia or hypercapnia ([place])   . Afib ([place]) 01/25/2019  . SBO (small bowel obstruction) ([place]) 08/10/2018  . Anemia due to chronic blood loss   . Heme positive stool   . Platelet inhibition due to Plavix   . Acute GI bleeding 05/06/2018  . ESRD (end stage renal disease) on dialysis ([place]) 05/06/2018  . Anxiety 05/06/2018  . Bipolar affective ([place]) 05/06/2018  . CAD (coronary artery disease), native coronary artery 05/06/2018  . COPD with chronic bronchitis ([place]) 05/06/2018  . CVA (cerebral vascular accident) ([place]) 05/06/2018  . Essential hypertension 05/06/2018  . Hyperlipidemia 05/06/2018    Orientation RESPIRATION BLADDER Height & Weight     Self, Time, Place  O2 (Nasal Cannula 2 liters) Incontinent, External catheter (External Urinary Catheter) Weight: 109 lb 5.6 oz (49.6 kg) Height:  '5\' 2"'  (157.5 cm)  BEHAVIORAL SYMPTOMS/MOOD NEUROLOGICAL BOWEL NUTRITION STATUS      Continent Diet (See Discharge Summary)  AMBULATORY STATUS COMMUNICATION OF NEEDS Skin   Total Care Verbally Skin abrasions, Surgical wounds, Other (Comment) (Cyanosis Dry,Abrasion,Blister necrotic toes,Abrasion Elbow Rt.Blister serous leg Rt. Foam,cracking loc.ft. rt left,Ecchymo.scattered Petechiae Ft,leg,toe rt;left skin tearelbow leg rt foam,Wound Incis.openordehisced toeleft rt left middletoe&rtpinkietoe)                       Personal Care Assistance Level of Assistance  Bathing, Feeding, Dressing Bathing Assistance: Maximum assistance Feeding assistance: Maximum assistance (Clear Liquid;Renal) Dressing Assistance: Maximum assistance  Functional Limitations Info  Sight, Hearing, Speech Sight Info: Impaired Hearing Info: Adequate Speech Info: Adequate    SPECIAL CARE FACTORS FREQUENCY  PT (By licensed PT), OT (By licensed OT)     PT Frequency: 5x min weekly OT Frequency: 5x min weekly             Contractures Contractures Info: Not present    Additional Factors Info  Code Status, Allergies Code Status Info: DNR Allergies Info: Betaine,Cyclobenzaprine,Morphine,Penicillins,Ambien (zolpidem),Codeine,Hydromorphone           Current Medications (08/10/2019):  This is the current hospital active medication list Current Facility-Administered Medications  Medication Dose Route Frequency Provider Last Rate Last Admin  . 0.9 %  sodium chloride infusion (Manually program via Guardrails IV Fluids)   Intravenous Once Thurnell Lose, MD   Stopped at 08/06/19 1716  . acetaminophen (TYLENOL) tablet 650 mg  650 mg Oral Q4H PRN Cristal Generous, NP   650 mg at 08/09/19 0848  . albuterol (PROVENTIL) (2.5 MG/3ML) 0.083% nebulizer solution 2.5 mg  2.5 mg Nebulization Q3H PRN Ollis, Brandi L, NP      . amiodarone (PACERONE) tablet 200 mg  200 mg Oral BID Ollis, Brandi L, NP   200 mg at 08/10/19 0818  . amLODipine (NORVASC) tablet 10 mg  10 mg Oral Daily Thurnell Lose, MD   10 mg at 08/10/19 0818  . arformoterol (BROVANA) nebulizer solution 15 mcg  15 mcg Nebulization BID Noe Gens L, NP   15 mcg at 08/10/19 0732  . aspirin chewable tablet 81 mg  81 mg Oral Daily Thurnell Lose, MD   81 mg at 08/10/19 0818  . atorvastatin (LIPITOR) tablet 40 mg  40 mg Oral Daily Rosalin Hawking, MD   40 mg at 08/10/19 0820  . budesonide (PULMICORT) nebulizer solution 0.5 mg  0.5 mg Nebulization BID Alfredo Martinez, Brandi L, NP   0.5 mg at 08/10/19 0732  . carvedilol (COREG) tablet 6.25 mg  6.25 mg Oral BID WC Thurnell Lose, MD   6.25 mg at 08/10/19 0818  . chlorhexidine gluconate (MEDLINE KIT) (PERIDEX) 0.12 % solution 15 mL  15 mL Mouth Rinse BID Icard, Bradley L, DO   15 mL at 08/07/19 0807  . Chlorhexidine Gluconate Cloth 2 % PADS 6 each  6 each Topical Q0600 Rosita Fire, MD   6 each at 08/09/19 0545  . Chlorhexidine Gluconate Cloth 2 % PADS 6 each  6 each Topical Q0600 Roney Jaffe, MD   6 each  at 08/07/19 [phone]  . Chlorhexidine Gluconate Cloth 2 % PADS 6 each  6 each Topical Q0600 Roney Jaffe, MD      . Darbepoetin Alfa (ARANESP) injection 60 mcg  60 mcg Intravenous Q Tue-HD Valentina Gu, NP   60 mcg at 08/09/19 0850  . docusate sodium (COLACE) capsule 100 mg  100 mg Oral BID PRN Cristal Generous, NP   100 mg at 08/09/19 2105  . feeding supplement (NEPRO CARB STEADY) liquid 237 mL  237 mL Oral TID BM Thurnell Lose, MD 0 mL/hr at 08/06/19 2135 237 mL at 08/09/19 2000  . gabapentin (NEURONTIN) tablet 300 mg  300 mg Oral BID Thurnell Lose, MD   300 mg at 08/10/19 0820  . guaiFENesin (ROBITUSSIN) 100 MG/5ML solution 100 mg  5 mL Oral Q4H PRN Frederik Pear, MD   100 mg at 08/10/19 0817  . heparin injection 5,000 Units  5,000 Units Subcutaneous Q8H Singh,  Margaree Mackintosh, MD      . hydrALAZINE (APRESOLINE) injection 10-40 mg  10-40 mg Intravenous Q4H PRN Ollis, Brandi L, NP   20 mg at 08/04/19 0034  . HYDROcodone-acetaminophen (NORCO/VICODIN) 5-325 MG per tablet 1 tablet  1 tablet Oral Q6H PRN Thurnell Lose, MD   1 tablet at 08/10/19 0022  . MEDLINE mouth rinse  15 mL Mouth Rinse BID Icard, Bradley L, DO   15 mL at 08/09/19 2229  . metroNIDAZOLE (FLAGYL) IVPB 500 mg  500 mg Intravenous Q8H Lala Lund K, MD 100 mL/hr at 08/10/19 1102 500 mg at 08/10/19 1102  . midodrine (PROAMATINE) tablet 10 mg  10 mg Oral Q dialysis Rosita Fire, MD   10 mg at 08/03/19 1132  . multivitamin ([name]-VIT) tablet 1 tablet  1 tablet Oral QHS Thurnell Lose, MD   1 tablet at 08/09/19 2059  . neomycin-bacitracin-polymyxin (NEOSPORIN) ointment   Topical BID Thurnell Lose, MD   Given at 08/09/19 2111  . ondansetron (ZOFRAN) injection 4 mg  4 mg Intravenous Q6H PRN Bowser, Laurel Dimmer, NP   4 mg at 08/04/19 0200  . pantoprazole (PROTONIX) EC tablet 40 mg  40 mg Oral BID Thurnell Lose, MD   40 mg at 08/10/19 0818  . polyethylene glycol (MIRALAX / GLYCOLAX) packet 17 g  17 g Oral  Daily PRN Bowser, Laurel Dimmer, NP      . sodium chloride flush (NS) 0.9 % injection 10-40 mL  10-40 mL Intracatheter Q12H Icard, Bradley L, DO   10 mL at 08/09/19 2229  . traMADol (ULTRAM) tablet 50 mg  50 mg Oral Q6H PRN Frederik Pear, MD   50 mg at 08/10/19 1101     Discharge Medications: Please see discharge summary for a list of discharge medications.  Relevant Imaging Results:  Relevant Lab Results:   Additional Information [id number]  Trula Ore, LCSWA

## 2019-08-10 NOTE — Progress Notes (Signed)
PROGRESS NOTE    Christina [name]  PPI:[id number] DOB: 08/11/1950 DOA: 08/01/2019 PCP: Maggie Schwalbe, PA-C   Brief Narrative:  Patient is a 69 year old female with history of ESRD, noncompliant with dialysis on multiple occasions, coronary disease status post stents, peripheral artery disease status post left carotid endarterectomy, hypertension, hyperlipidemia, alcohol abuse, GI bleed, COPD, GERD, duodenal ulcers, anemia of chronic disease, medication noncompliance was brought to the [facility] ER after she had a diastolic arrest at home with 10-minute downtime requiring CPR thought to be secondary to hyperkalemia.  She was intubated and transferred to [facility] under PCCM service.  Hospital course remarkable for right-sided arm weakness and imaging studies showed stable small subdural hematoma.  She was successfully extubated and transferred to hospital service on 08/05/2019.  Now she is waiting for skilled nursing facility placement.  Assessment & Plan:   Active Problems:   Cardiac arrest [facility])   Medical non-compliance   Malnutrition of moderate degree   Cardiac arrest with subsequent respiratory failure: Suspected to be from hyperkalemia due to noncompliance with dialysis.  Currently hemodynamically stable.  She had to be intubated after being given CPR, successfully extubated.  Echocardiogram showed ejection fraction of 55% with no regional wall motion abnormality.  Coronary artery disease: Status post stent placement in the past, history of paroxysmal A. fib, peripheral artery disease: Status post SMA stent, left carotid endarterectomy.  Currently on statin Coreg.  Also on aspirin.  Plavix held due to recent subdural hematoma.  ESRD: Being dialyzed on TTS schedule.  Nephrology on board.  Acute metabolic encephalopathy: Status post cardiac arrest.  Currently alert and awake.  Continue supportive care  Subdural hematoma: Seen on the CT head.  She developed new right arm weakness  earlier.  Neurology was consulted.  Started on statin, being continued on aspirin.   Plan to repeat CT head on 08/11/2019.  If stable subdural bleed then will resume Plavix.  Hypertension: Currently blood was stable.  On Coreg.  Monitor blood pressure  Normocytic anemia/chronic thrombocytopenia: Most likely associated  with her chronic kidney disease.  She also has possible alcoholic cirrhosis.  She was transfused with a unit of  PRBC during this hospitalization.  Currently H&H stable.  Severe protein calorie malnutrition: Continue nutrition supplements.  History of proximal A. fib: Continue beta-blocker.  Currently rate is well controlled.Chads vasc score  2 score of greater than 4.  Not on anticoagulation due to history of severe noncompliance as well as recent history of upper GI bleed.  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  She is also very noncompliant.  Received IVC filter by IR on 08/05/2019.  Hyperglycemia/intermittent episodes of hypoglycemia: No history of diabetes.  A1c is 4.9.  Multiple drops in the blood sugars also noted.  Monitor sugars.  Hypoglycemic episodes are most likely due to poor oral intake.  Insulin has been stopped.  C-peptide, insulin sent.  Cortisol normal and TSH is 6.1.  Suspected aspiration pneumonia: Likely secondary to encephalopathy.  Treated with Maxipime and Flagyl.  Currently on soft diet.  Aspiration precautions.  Goals of care: Overall very poor prognosis with multiple comorbidities, along history of noncompliance.  Family has been thoroughly updated by my colleagues.  Currently she is DNR.  I think the best approach for her will be comfort care.  Palliative care consulted  Debility/deconditioning: Patient seen by PT/OT and recommended to skilled nursing facility on  discharge.  Social worker following.         Nutrition Problem: Moderate  Malnutrition Etiology: chronic illness (ESRD)      DVT prophylaxis:Heparin [place] Code Status: DNR Family Communication: None Status is: Inpatient  Remains inpatient appropriate because:Unsafe d/c plan   Dispo: The patient is from: Home              Anticipated d/c is to: SNF              Anticipated d/c date is: 3 days              Patient currently is not medically stable to d/c.     Consultants: PCCM, nephro, neuro Intubation/extubation, central line placement, IVC filter placement Procedures:  Antimicrobials:  Anti-infectives (From admission, onward)   Start     Dose/Rate Route Frequency Ordered Stop   08/09/19 1800  ceFEPIme (MAXIPIME) 2 g in sodium chloride 0.9 % 100 mL IVPB        2 g 200 mL/hr over 30 Minutes Intravenous Every T-Th-Sa (Hemodialysis) 08/07/19 1054 08/09/19 1803   08/06/19 1800  ceFEPIme (MAXIPIME) 2 g in sodium chloride 0.9 % 100 mL IVPB  Status:  Discontinued        2 g 200 mL/hr over 30 Minutes Intravenous Every T-Th-Sa (Hemodialysis) 08/06/19 1034 08/07/19 1054   08/06/19 1200  metroNIDAZOLE (FLAGYL) IVPB 500 mg     Discontinue     500 mg 100 mL/hr over 60 Minutes Intravenous Every 8 hours 08/06/19 1034 08/10/19 2359      Subjective: Patient seen and examined at the bedside this afternoon.  Lying on the bed.  Very weak, alert and awake and oriented.  Complains of back pain.  Objective: Vitals:   08/09/19 2114 08/10/19 0403 08/10/19 0732 08/10/19 0818  BP:  (!) 174/66  (!) 156/65  Pulse:  73  84  Resp: 15 16    Temp: 98.2 F (36.8 C) 98 F (36.7 C)    TempSrc: Oral Oral    SpO2:  96% 96%   Weight:  49.6 kg    Height:        Intake/Output Summary (Last 24 hours) at 08/10/2019 1315 Last data filed at 08/10/2019 0308 Gross per 24 hour  Intake --  Output 400 ml  Net -400 ml   Filed Weights   08/09/19 0720 08/09/19 1130 08/10/19 0403  Weight: 51.2 kg 47.8 kg 49.6 kg    Examination:  General exam: Extremely deconditioned,  debilitated, generalized weakness Respiratory system: Bilateral basal crackles  cardiovascular system: S1 & S2 heard, RRR. No JVD, murmurs, rubs, gallops or clicks. No pedal edema.  Central line on the left chest Gastrointestinal system: Abdomen is nondistended, soft and nontender. No organomegaly or masses felt. Normal bowel sounds heard. Central nervous system: Alert and oriented.  Weakness on bilateral upper extremities, weak hand grips on the right Extremities: No edema, no clubbing ,no cyanosis, AV graft on the left arm Skin: No rashes, lesions or ulcers,no icterus ,no pallor   Data Reviewed: I have personally reviewed following labs and imaging studies  CBC: Recent Labs  Lab 08/06/19 0540 08/07/19 0333 08/08/19 0541 08/09/19 0500 08/10/19 0500  WBC 10.3 7.9 7.6 8.5 8.2  NEUTROABS  --  6.2 6.1 6.9 6.3  HGB 7.3* 8.8* 9.6* 9.6* 8.3*  HCT 25.1* 28.7* 30.8* 31.3* 28.1*  MCV 98.8 92.9 94.2 92.9 95.9  PLT 133* 94* 111* 140* 867   Basic Metabolic Panel: Recent Labs  Lab  08/03/19 1621 08/03/19 2033 08/07/19 0333 08/08/19 0541 08/09/19 0500 08/10/19 0500 08/10/19 1030  NA  --    < > 136 133* 135 133* 135  K  --    < > 4.4 4.6 4.8 3.8 4.1  CL  --    < > 98 97* 96* 96* 98  CO2  --    < > '27 24 24 27 27  ' GLUCOSE  --    < > 138* 175* 85 86 186*  BUN  --    < > 37* 54* 68* 37* 39*  CREATININE  --    < > 4.01* 5.29* 5.96* 3.68* 4.06*  CALCIUM  --    < > 7.4* 8.1* 8.5* 8.1* 8.3*  MG 1.7  --  1.6* 2.3  --   --   --   PHOS 2.1*  --   --   --   --   --  5.1*   < > = values in this interval not displayed.   GFR: Estimated Creatinine Clearance: 10.2 mL/min (A) (by C-G formula based on SCr of 4.06 mg/dL (H)). Liver Function Tests: Recent Labs  Lab 08/05/19 0500 08/06/19 0540 08/10/19 1030  AST 62* 35  --   ALT 222* 145*  --   ALKPHOS 86 75  --   BILITOT 1.0 1.1  --   PROT 5.0* 4.5*  --   ALBUMIN 2.1* 2.0* 1.6*   No results for input(s): LIPASE, AMYLASE in the last 168  hours. No results for input(s): AMMONIA in the last 168 hours. Coagulation Profile: No results for input(s): INR, PROTIME in the last 168 hours. Cardiac Enzymes: No results for input(s): CKTOTAL, CKMB, CKMBINDEX, TROPONINI in the last 168 hours. BNP (last 3 results) No results for input(s): PROBNP in the last 8760 hours. HbA1C: Recent Labs    08/09/19 0738  HGBA1C 4.4*   CBG: Recent Labs  Lab 08/07/19 0412 08/07/19 0829 08/07/19 1231 08/07/19 1612 08/09/19 1714  GLUCAP 120* 84 92 178* 102*   Lipid Profile: No results for input(s): CHOL, HDL, LDLCALC, TRIG, CHOLHDL, LDLDIRECT in the last 72 hours. Thyroid Function Tests: Recent Labs    08/08/19 0546  TSH 6.181*   Anemia Panel: No results for input(s): VITAMINB12, FOLATE, FERRITIN, TIBC, IRON, RETICCTPCT in the last 72 hours. Sepsis Labs: Recent Labs  Lab 08/06/19 1010 08/07/19 0333 08/08/19 0541  PROCALCITON 1.45 1.35 1.03    Recent Results (from the past 240 hour(s))  SARS Coronavirus 2 by RT PCR (hospital order, performed in [facility] hospital lab) Nasopharyngeal Nasopharyngeal Swab     Status: None   Collection Time: 08/01/19 12:08 PM   Specimen: Nasopharyngeal Swab  Result Value Ref Range Status   SARS Coronavirus 2 NEGATIVE NEGATIVE Final    Comment: (NOTE) SARS-CoV-2 target nucleic acids are NOT DETECTED.  The SARS-CoV-2 RNA is generally detectable in upper and lower respiratory specimens during the acute phase of infection. The lowest concentration of SARS-CoV-2 viral copies this assay can detect is 250 copies / mL. A negative result does not preclude SARS-CoV-2 infection and should not be used as the sole basis for treatment or other patient management decisions.  A negative result may occur with improper specimen collection / handling, submission of specimen other than nasopharyngeal swab, presence of viral mutation(s) within the areas targeted by this assay, and inadequate number of viral  copies (<250 copies / mL). A negative result must be combined with clinical observations, patient history, and epidemiological information.  Fact Sheet for Patients:   StrictlyIdeas.no  Fact Sheet for Healthcare Providers: BankingDealers.co.za  This test is not yet approved or  cleared by the [place] FDA and has been authorized for detection and/or diagnosis of SARS-CoV-2 by FDA under an Emergency Use Authorization (EUA).  This EUA will remain in effect (meaning this test can be used) for the duration of the COVID-19 declaration under Section 564(b)(1) of the Act, 21 U.S.C. section 360bbb-3(b)(1), unless the authorization is terminated or revoked sooner.  Performed at [facility], [place] [address], [place], [place] [address]   Culture, blood (Routine X 2) w Reflex to ID Panel     Status: None   Collection Time: 08/01/19 12:44 PM   Specimen: BLOOD RIGHT ARM  Result Value Ref Range Status   Specimen Description BLOOD RIGHT ARM  Final   Special Requests   Final    BOTTLES DRAWN AEROBIC ONLY Blood Culture results may not be optimal due to an inadequate volume of blood received in culture bottles   Culture   Final    NO GROWTH 5 DAYS Performed at [facility], [place] [address] [place], [place] [address]    Report Status 08/06/2019 FINAL  Final  MRSA PCR Screening     Status: None   Collection Time: 08/01/19 12:45 PM  Result Value Ref Range Status   MRSA by PCR NEGATIVE NEGATIVE Final    Comment:        The GeneXpert MRSA Assay (FDA approved for NASAL specimens only), is one component of a comprehensive MRSA colonization surveillance program. It is not intended to diagnose MRSA infection nor to guide or monitor treatment for MRSA infections. Performed at [facility], [place] [address] [place], [place] [address]   Culture, blood (Routine X 2) w Reflex to ID Panel     Status: None   Collection Time: 08/01/19  12:48 PM   Specimen: BLOOD RIGHT WRIST  Result Value Ref Range Status   Specimen Description BLOOD RIGHT WRIST  Final   Special Requests   Final    BOTTLES DRAWN AEROBIC ONLY Blood Culture adequate volume   Culture   Final    NO GROWTH 5 DAYS Performed at [facility], [address] [place],  [address]    Report Status 08/06/2019 FINAL  Final  Culture, respiratory (non-expectorated)     Status: None   Collection Time: 08/01/19  3:59 PM   Specimen: Tracheal Aspirate; Respiratory  Result Value Ref Range Status   Specimen Description TRACHEAL ASPIRATE  Final   Special Requests Normal  Final   Gram Stain   Final    FEW WBC PRESENT, PREDOMINANTLY PMN ABUNDANT GRAM POSITIVE COCCI RARE GRAM NEGATIVE RODS RARE GRAM POSITIVE RODS    Culture   Final    FEW Consistent with normal respiratory flora. Performed at [facility], [place] [address] [place],  [address]    Report Status 08/04/2019 FINAL  Final         Radiology Studies: CT HEAD WO CONTRAST  Result Date: 08/09/2019 CLINICAL DATA:  Asystolic arrest 58/52/7782, hyperkalemia, metabolic encephalopathy, history of subdural hematoma EXAM: CT HEAD WITHOUT CONTRAST TECHNIQUE: Contiguous axial images were obtained from the base of the skull through the vertex without intravenous contrast. COMPARISON:  08/05/2019, 08/04/2019 FINDINGS: Brain: The trace falcine subdural hematoma is again identified with decreased prominence since prior study. No new areas of hemorrhage. Scattered hypodensities are seen within the bilateral  cortex, bilateral basal ganglia, and bilateral cerebellar hemispheres, corresponding to the multifocal small infarcts seen on recent MRI. The lateral ventricles and midline structures are stable. No mass effect. Vascular: No hyperdense vessel or unexpected calcification. Skull: Normal. Negative for fracture or focal lesion. Sinuses/Orbits: No acute finding. Other: None. IMPRESSION: 1. Stable small  falcine subdural hematoma, decreased in prominence since prior study. No mass effect. 2. 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 No change since recent MRI. 3. No evidence of parenchymal hemorrhage. Electronically Signed   By: Randa Ngo M.D.   On: 08/09/2019 17:35   MR BRAIN WO CONTRAST  Result Date: 08/10/2019 CLINICAL DATA:  Stroke follow-up EXAM: MRI HEAD WITHOUT CONTRAST TECHNIQUE: Multiplanar, multiecho pulse sequences of the brain and surrounding structures were obtained without intravenous contrast. COMPARISON:  MRI of the brain August 05, 2019 FINDINGS: The study is partially degraded by motion. Brain: Multiple foci of restricted diffusion are seen within the bilateral of cerebral and cerebellar hemispheres, right thalamus and right caudate, less evident than on prior MRI, likely due to pseudo normalization in some of the lesions. The lesions in the bilateral cerebral hemisphere appear to be predominantly in watershed distribution. Some of the lesions show susceptibility artifact os 1, consistent with microhemorrhages. Extensive confluent T2 hyperintensity of the white matter of the cerebral hemispheres and within the pons, nonspecific, most likely related to chronic small vessel ischemia. 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 Vascular: Normal flow voids. Skull and upper cervical spine: Advanced degenerative changes with spinal canal stenosis at C3-4, partially evaluated. Sinuses/Orbits: Mucosal thickening of the ethmoid cells and frontal sinuses the orbits are maintained. Other: Bilateral mastoid effusion. IMPRESSION: 1. Multiple foci of restricted diffusion within the bilateral cerebral and cerebellar hemispheres, right thalamus and right caudate, less evident than on prior MRI, likely due to pseudo normalization in some of the lesions. The lesions in the bilateral  cerebral hemisphere appear to be predominantly in watershed distribution. 2. Extensive confluent T2 hyperintensity of the white matter of the cerebral hemispheres and pons, nonspecific, most likely related to chronic small vessel ischemia. 3. 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 4. Bilateral mastoid effusion. Electronically Signed   By: Pedro Earls M.D.   On: 08/10/2019 11:17   EEG adult  Result Date: 08/09/2019 Lora Havens, MD     08/09/2019  6:01 PM Patient Name: Christina [name] MRN: [id number] Epilepsy Attending: Lora Havens Referring Physician/Provider: Dr Lala Lund Date: 08/09/2019 Duration: 24.7 mins Patient history: 69 yo F with SDH and multifocal acute infarcts. EEG to evaluate for seizure. Level of alertness: lethargic AEDs during EEG study: Gabapentin Technical aspects: This EEG study was done with scalp electrodes positioned according to the 10-20 International system of electrode placement. Electrical activity was acquired at a sampling rate of '500Hz'  and reviewed with a high frequency filter of '70Hz'  and a low frequency filter of '1Hz' . EEG data were recorded continuously and digitally stored. Description: EEG showed continuous generalized 3 to 6 Hz theta-delta slowing. Hyperventilation and photic stimulation were not performed.   ABNORMALITY -Continuous slow, generalized IMPRESSION: This study is suggestive of moderate diffuse encephalopathy, nonspecific etiology. No seizures or epileptiform discharges were seen throughout the recording. Priyanka [name]        Scheduled Meds: . sodium chloride   Intravenous Once  . amiodarone  200 mg Oral BID  . amLODipine  10 mg Oral Daily  . arformoterol  15 mcg Nebulization BID  . aspirin  81 mg Oral Daily  . atorvastatin  40 mg Oral Daily  . budesonide (PULMICORT) nebulizer solution  0.5 mg Nebulization BID  . carvedilol  6.25 mg Oral BID WC  . chlorhexidine gluconate  (MEDLINE KIT)  15 mL Mouth Rinse BID  . Chlorhexidine Gluconate Cloth  6 each Topical Q0600  . Chlorhexidine Gluconate Cloth  6 each Topical Q0600  . Chlorhexidine Gluconate Cloth  6 each Topical Q0600  . darbepoetin (ARANESP) injection - DIALYSIS  60 mcg Intravenous Q Tue-HD  . feeding supplement (NEPRO CARB STEADY)  237 mL Oral TID BM  . gabapentin  300 mg Oral BID  . heparin injection (subcutaneous)  5,000 Units Subcutaneous Q8H  . mouth rinse  15 mL Mouth Rinse BID  . multivitamin  1 tablet Oral QHS  . neomycin-bacitracin-polymyxin   Topical BID  . pantoprazole  40 mg Oral BID  . sodium chloride flush  10-40 mL Intracatheter Q12H   Continuous Infusions: . metronidazole 500 mg (08/10/19 1102)     LOS: 9 days    Time spent: 25 mins.More than 50% of that time was spent in counseling and/or coordination of care.      Shelly Coss, MD [facility] P7/14/2021, 1:15 PM

## 2019-08-10 NOTE — TOC Initial Note (Addendum)
Transition of Care [facility]) - Initial/Assessment Note    Patient Details  Name: Christina [name] MRN: [id number] Date of Birth: [birth year]  Transition of Care [facility]) CM/SW Contact:    Trula Ore, [place] Phone Number: 08/10/2019, 11:49 AM  Clinical Narrative:                  CSW spoke with patients daughter by phone [name]. Patients daughter was agreeable to SNF placement for patient. Patients daughter gave CSW permission to fax out initial referral near the [facility] area for SNF placement.  SNF bed offers pending. CSW will start insurance authorization for patient.  CSW will continue to follow.  Expected Discharge Plan: Skilled Nursing Facility Barriers to Discharge: Continued Medical Work up   Patient Goals and CMS Choice   CMS Medicare.gov Compare Post Acute Care list provided to:: Patient Represenative (must comment) ([name]) Choice offered to / list presented to : Adult Children [name])  Expected Discharge Plan and Services Expected Discharge Plan: [place]       Living arrangements for the past 2 months: Apartment                                      Prior Living Arrangements/Services Living arrangements for the past 2 months: Apartment Lives with:: Self Patient language and need for interpreter reviewed:: Yes Do you feel safe going back to the place where you live?: No   SNF  Need for Family Participation in Patient Care: Yes (Comment) Care giver support system in place?: Yes (comment)   Criminal Activity/Legal Involvement Pertinent to Current Situation/Hospitalization: No - Comment as needed  Activities of Daily Living Home Assistive Devices/Equipment: None ADL Screening (condition at time of admission) Patient's cognitive ability adequate to safely complete daily activities?: No Is the patient deaf or have difficulty hearing?: No Does the patient have difficulty seeing, even when wearing glasses/contacts?:  No Does the patient have difficulty concentrating, remembering, or making decisions?: Yes Patient able to express need for assistance with ADLs?: Yes Does the patient have difficulty dressing or bathing?: Yes Independently performs ADLs?: Yes (appropriate for developmental age) Does the patient have difficulty walking or climbing stairs?: Yes Weakness of Legs: Both Weakness of Arms/Hands: None  Permission Sought/Granted Permission sought to share information with : Case Manager, Family Supports, Customer service manager Permission granted to share information with : Yes, Verbal Permission Granted  Share Information with NAME: [name]  Permission granted to share info w AGENCY: SNF  Permission granted to share info w Relationship: Daughter  Permission granted to share info w Contact Information: [name] [phone]  Emotional Assessment       Orientation: : Oriented to Self, Oriented to Place, Oriented to  Time Alcohol / Substance Use: Not Applicable Psych Involvement: No (comment)  Admission diagnosis:  Cardiac arrest [facility]) [I46.9] Patient Active Problem List   Diagnosis Date Noted  . Malnutrition of moderate degree 08/04/2019  . Medical non-compliance   . Cardiac arrest ([place]) 08/01/2019  . Melena   . History of duodenal ulcer   . Symptomatic anemia   . GI bleed 06/30/2019  . Cerebral embolism with cerebral infarction 06/19/2019  . Atrial fibrillation with rapid ventricular response ([place]) 06/14/2019  . Incomplete rectal prolapse 06/14/2019  . Respiratory failure with hypoxia ([place]) 05/10/2019  . Pneumonia 05/10/2019  . 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 Duodenal ulcer perforation ([place]) 04/23/2019  .  Cocaine abuse ([place]) 04/23/2019  . Acute gastric ulcer without hemorrhage or perforation   . Hematemesis 04/04/2019  . Atrial fibrillation with RVR ([place])   . Duodenal ulcer with hemorrhage   . Gastritis and gastroduodenitis   . Acute blood loss  anemia   . 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 RUQ pain   . Anemia due to chronic kidney disease, on chronic dialysis ([place])   . AF (paroxysmal atrial fibrillation) ([place])   . Chest pain 03/19/2019  . Hyperkalemia 02/28/2019  . Uremia 02/27/2019  . Volume overload 02/21/2019  . Abdominal pain 02/21/2019  . Non-compliance with renal dialysis ([place]) 02/21/2019  . Respiratory failure ([place]) 02/04/2019  . Acute and chr resp failure, unsp w hypoxia or hypercapnia ([place])   . Afib ([place]) 01/25/2019  . SBO (small bowel obstruction) ([place]) 08/10/2018  . Anemia due to chronic blood loss   . Heme positive stool   . Platelet inhibition due to Plavix   . Acute GI bleeding 05/06/2018  . ESRD (end stage renal disease) on dialysis ([place]) 05/06/2018  . Anxiety 05/06/2018  . Bipolar affective ([place]) 05/06/2018  . CAD (coronary artery disease), native coronary artery 05/06/2018  . COPD with chronic bronchitis ([place]) 05/06/2018  . CVA (cerebral vascular accident) ([place]) 05/06/2018  . Essential hypertension 05/06/2018  . Hyperlipidemia 05/06/2018   PCP:  Maggie Schwalbe, PA-C Pharmacy:   [facility] [place] [address] Phone: [phone] Fax: [phone]     Social Determinants of Health (SDOH) Interventions    Readmission Risk Interventions Readmission Risk Prevention Plan 07/05/2019 06/17/2019 03/29/2019  Transportation Screening Complete Complete Complete  Medication Review Press photographer) Complete Complete Complete  PCP or Specialist appointment within 3-5 days of discharge Complete - -  [place] or Home Care Consult Complete Complete Complete  SW Recovery Care/Counseling Consult Complete Complete Complete  Palliative Care Screening Not Applicable Not Applicable Not Applicable  Skilled Nursing Facility Complete (No Data) Not Applicable  Some recent data might be hidden

## 2019-08-11 ENCOUNTER — Inpatient Hospital Stay (HOSPITAL_COMMUNITY): Payer: Medicare Other

## 2019-08-11 DIAGNOSIS — I259 Chronic ischemic heart disease, unspecified: Secondary | ICD-10-CM

## 2019-08-11 LAB — RENAL FUNCTION PANEL
Albumin: 1.8 g/dL — ABNORMAL LOW (ref 3.5–5.0)
Anion gap: 12 (ref 5–15)
BUN: 52 mg/dL — ABNORMAL HIGH (ref 8–23)
CO2: 26 mmol/L (ref 22–32)
Calcium: 8.7 mg/dL — ABNORMAL LOW (ref 8.9–10.3)
Chloride: 97 mmol/L — ABNORMAL LOW (ref 98–111)
Creatinine, Ser: 5.06 mg/dL — ABNORMAL HIGH (ref 0.44–1.00)
GFR calc Af Amer: 9 mL/min — ABNORMAL LOW (ref >60)
GFR calc non Af Amer: 8 mL/min — ABNORMAL LOW (ref >60)
Glucose, Bld: 93 mg/dL (ref 70–99)
Phosphorus: 6.2 mg/dL — ABNORMAL HIGH (ref 2.5–4.6)
Potassium: 4.3 mmol/L (ref 3.5–5.1)
Sodium: 135 mmol/L (ref 135–145)

## 2019-08-11 MED ORDER — ONDANSETRON HCL 4 MG/2ML IJ SOLN: 4.0000 mg | Freq: Four times a day (QID) | INTRAMUSCULAR | Status: DC | PRN

## 2019-08-11 MED ORDER — HYDROCODONE-ACETAMINOPHEN 5-325 MG PO TABS
1.0000 | ORAL_TABLET | Freq: Four times a day (QID) | ORAL | Status: DC | PRN
  Administered 2019-08-11 – 2019-08-12 (×3): 1 via ORAL
  Filled 2019-08-11 (×3): qty 1

## 2019-08-11 MED ORDER — LIDOCAINE 5 % EX PTCH
1.0000 | MEDICATED_PATCH | CUTANEOUS | Status: DC
  Administered 2019-08-11 – 2019-08-12 (×2): 1 via TRANSDERMAL
  Filled 2019-08-11 (×2): qty 1

## 2019-08-11 MED ORDER — ACETAMINOPHEN 325 MG PO TABS: 650.0000 mg | ORAL_TABLET | Freq: Four times a day (QID) | ORAL | Status: DC | PRN

## 2019-08-11 MED ORDER — BISACODYL 5 MG PO TBEC
10.0000 mg | DELAYED_RELEASE_TABLET | Freq: Every day | ORAL | Status: DC
  Administered 2019-08-11 – 2019-08-12 (×2): 10 mg via ORAL
  Filled 2019-08-11 (×2): qty 2

## 2019-08-11 MED ORDER — ACETAMINOPHEN 650 MG RE SUPP: 650.0000 mg | Freq: Four times a day (QID) | RECTAL | Status: DC | PRN

## 2019-08-11 MED ORDER — POLYVINYL ALCOHOL 1.4 % OP SOLN
1.0000 [drp] | Freq: Four times a day (QID) | OPHTHALMIC | Status: DC | PRN
  Filled 2019-08-11: qty 15

## 2019-08-11 MED ORDER — BIOTENE DRY MOUTH MT LIQD: 15.0000 mL | OROMUCOSAL | Status: DC | PRN

## 2019-08-11 MED ORDER — HALOPERIDOL 0.5 MG PO TABS
0.5000 mg | ORAL_TABLET | ORAL | Status: DC | PRN
  Filled 2019-08-11: qty 1

## 2019-08-11 MED ORDER — ALPRAZOLAM 0.5 MG PO TABS
0.5000 mg | ORAL_TABLET | Freq: Three times a day (TID) | ORAL | Status: DC | PRN
  Administered 2019-08-11 (×2): 0.5 mg via ORAL
  Filled 2019-08-11 (×2): qty 1

## 2019-08-11 MED ORDER — GLYCOPYRROLATE 1 MG PO TABS
1.0000 mg | ORAL_TABLET | ORAL | Status: DC | PRN
  Filled 2019-08-11: qty 1

## 2019-08-11 MED ORDER — GLYCOPYRROLATE 0.2 MG/ML IJ SOLN: 0.2000 mg | INTRAMUSCULAR | Status: DC | PRN

## 2019-08-11 MED ORDER — ONDANSETRON 4 MG PO TBDP
4.0000 mg | ORAL_TABLET | Freq: Four times a day (QID) | ORAL | Status: DC | PRN
  Filled 2019-08-11: qty 1

## 2019-08-11 MED ORDER — HALOPERIDOL LACTATE 2 MG/ML PO CONC
0.5000 mg | ORAL | Status: DC | PRN
  Filled 2019-08-11: qty 0.3

## 2019-08-11 MED ORDER — CLOPIDOGREL BISULFATE 75 MG PO TABS: 75.0000 mg | ORAL_TABLET | Freq: Every day | ORAL | Status: DC

## 2019-08-11 MED ORDER — HALOPERIDOL LACTATE 5 MG/ML IJ SOLN: 0.5000 mg | INTRAMUSCULAR | Status: DC | PRN

## 2019-08-11 NOTE — Consult Note (Signed)
Christina [name]: Referral received for Home care hospice in [place]. Spoke to family and questions answered. Approved for hospice services at discharge. DME: Hospital Bed, OBT, Nebulizer, Suction and oxygen were ordered for delivery this evening anticipating discharge tomorrow. Will follow up with social work in am to discuss discharge plan and transfer. Please call with any questions. Doroteo Glassman, RN [facility] [phone] Thank you for the opportunity to serve this patient and family.

## 2019-08-11 NOTE — Progress Notes (Signed)
Late day progress note.   Invited daughters to the hospital.  Spoke with them about Hospice prior to their arrival.    Dr. Jonnie Finner met with them in the patient's room and had the difficult discussion with the patient about stopping hemodialysis.   I met with them afterwards.   They have experience with [facility] and would like to engage them to take their mother home.  She will need a hospital bed in the home.  Discharge instructions:   Please send with Rx for hydrocodone, xanax, lidocaine patches.    Please discontinue plavix, aspirin, ferric citrate, nepro, and any home meds that are not related to her comfort.  Medications for rate control such as metoprolol do impact her comfort and should be continued for now.  [facility] with [facility].  Florentina Jenny, PA-C Palliative Medicine Office:  [phone]  No charge note.

## 2019-08-11 NOTE — Progress Notes (Signed)
Physical Therapy Treatment Patient Details Name: Christina [name] MRN: [id number] DOB: 11/19/1950 Today's Date: 08/11/2019    History of Present Illness Pt is 69 yo female transferred from [facility] 7/5 post asystolic arrest with 10 minute downtime. Hyperkalemia thought to be source of arrest.  Pt with acute metabolic encephalopathy s/p arrest with trace subdural hematoma along the falx noted 7/8. RLE DVT with IVC filter placed 7/9 as anticoagulation not an option. MRI of her brain showed:  numerous small acute infarcts involving multiple vascular territories bilaterally; advanced chronic microvascular ischemic changes and multiple chronic infarcts.  PMHx: HTN, HLD, CAD, CEA on L, SMA stent May 2021, CKD/ESRD, COPD, GERD, and GIB, polysubstance abuse including cocaine, medical non compliance     PT Comments    Pt awake, supine in bed with 2 daughters in room. Pt more alert and conversant today. When asked if she would like to work with therapy to get up to the chair to be able to visit with her daughters, she replies that she would. Pt with better command follow today and volitional movement of all 4 extremities, not always purposeful. Pt is able to move LE to EOB but requires maxA for bringing trunk to upright. Pt requires total A to transfer over to chair. Pt and family appreciative of ability to sit and talk. Family would like pt to come home with hospice care.    Follow Up Recommendations  Other (comment);Supervision/Assistance - 24 hour (pt to transfer home with hospice)     Equipment Recommendations   (TBD by hospice)       Precautions / Restrictions Precautions Precautions: Fall Precaution Comments: limited activity tolerance    Mobility  Bed Mobility Overal bed mobility: Needs Assistance Bed Mobility: Supine to Sit;Sit to Supine     Supine to sit: Max assist     General bed mobility comments: pt able to move bilateral LE to EoB, requiring maxA for bringing trunk to  upright   Transfers Overall transfer level: Needs assistance   Transfers: Squat Pivot Transfers           General transfer comment: total A for squat pivot transfer from bed to recliner, pt initiates forward trunk lean, increased B LE pain decreased weightbearing ability  Modified Rankin (Stroke Patients Only) Modified Rankin (Stroke Patients Only) Pre-Morbid Rankin Score: Moderate disability Modified Rankin: Severe disability     Balance Overall balance assessment: Needs assistance Sitting-balance support: Feet supported;Single extremity supported Sitting balance-Leahy Scale: Poor Sitting balance - Comments: minA initially able to progress to min guard for safety prior to transfer to recliner      Standing balance-Leahy Scale: Zero                              Cognition Arousal/Alertness: Awake/alert Behavior During Therapy: WFL for tasks assessed/performed Overall Cognitive Status: Impaired/Different from baseline Area of Impairment: Attention;Memory;Following commands;Safety/judgement;Awareness                 Orientation Level: Disoriented to;Time;Place;Situation Current Attention Level: Sustained Memory: Decreased short-term memory Following Commands: Follows one step commands with increased time;Follows multi-step commands with increased time Safety/Judgement: Decreased awareness of safety;Decreased awareness of deficits Awareness: Emergent Problem Solving: Slow processing;Decreased initiation;Difficulty sequencing;Requires verbal cues;Requires tactile cues General Comments: pt with better command follow today, more purposeful movement         General Comments  VSS on 2L O2 via [place]      Pertinent  Vitals/Pain Pain Assessment: Faces Faces Pain Scale: Hurts little more Pain Location: L LE Pain Descriptors / Indicators: Aching;Sharp;Throbbing Pain Intervention(s): Limited activity within patient's tolerance;Monitored during  session;Premedicated before session;Repositioned    Home Living Family/patient expects to be discharged to:: Private residence Living Arrangements: Children Available Help at Discharge: Family;Available 24 hours/day Type of Home: Apartment Home Access: Level entry   Home Layout: One level Home Equipment: Walker - 4 wheels Additional Comments: Pt reports she lives with her daughter    Prior Function Level of Independence: Needs assistance  Gait / Transfers Assistance Needed: Pt reports she ambulates without AD  ADL's / Homemaking Assistance Needed: Reports independent with ADLs Comments: Pt reports she, nor her daughter drive. Her sister provides some transportation, but often misses HD due to lack of transportation (per her report)   PT Goals (current goals can now be found in the care plan section) Acute Rehab PT Goals Patient Stated Goal: Pt unable to state  PT Goal Formulation: With patient Time For Goal Achievement: 08/18/19 Potential to Achieve Goals: Good Progress towards PT goals: Not progressing toward goals - comment (continued decline in medical status)    Frequency    Min 2X/week      PT Plan Discharge plan needs to be updated       AM-PAC PT "6 Clicks" Mobility   Outcome Measure  Help needed turning from your back to your side while in a flat bed without using bedrails?: Total Help needed moving from lying on your back to sitting on the side of a flat bed without using bedrails?: Total Help needed moving to and from a bed to a chair (including a wheelchair)?: Total Help needed standing up from a chair using your arms (e.g., wheelchair or bedside chair)?: Total Help needed to walk in hospital room?: Total Help needed climbing 3-5 steps with a railing? : Total 6 Click Score: 6    End of Session Equipment Utilized During Treatment: Gait belt;Oxygen Activity Tolerance: Patient limited by fatigue;Patient limited by pain Patient left: with call bell/phone  within reach;in chair Nurse Communication: Mobility status PT Visit Diagnosis: Unsteadiness on feet (R26.81);Muscle weakness (generalized) (M62.81);Difficulty in walking, not elsewhere classified (R26.2);Ataxic gait (R26.0) Pain - Right/Left: Right Pain - part of body: Leg;Ankle and joints of foot     Time: 1352-1410 PT Time Calculation (min) (ACUTE ONLY): 18 min  Charges:  $Therapeutic Activity: 8-22 mins                     Brison Fiumara B. Migdalia Dk PT, DPT Acute Rehabilitation Services Pager [phone] Office [phone]    [place] 08/11/2019, 2:29 PM

## 2019-08-11 NOTE — Progress Notes (Signed)
Nutrition Brief Note  Chart reviewed. Patient is transitioning to comfort care with plans to d/c home with [facility].  No further nutrition interventions warranted at this time.  Please re-consult as needed.   Lucas Mallow, RD, LDN, CNSC Please refer to [facility] for contact information.

## 2019-08-11 NOTE — Progress Notes (Signed)
[facility] Progress Note   Subjective: Seen by Palliative Care today, Hoping to transition to comfort care/hospice.  Patient is pleasant, slightly confused. C/O feet burning. Chaplain visiting.   Objective Vitals:   08/11/19 0413 08/11/19 0543 08/11/19 0647 08/11/19 0802  BP: (!) 157/53 (!) 167/58    Pulse: 71 73 80   Resp:  20    Temp:  98.4 F (36.9 C)    TempSrc:  Oral    SpO2: 96% 96% 97% 94%  Weight:   47.5 kg   Height:       Physical Exam General:Chronically ill appearing female in NAD Heart:S1,S2 RRR Lungs:decreased in bases few bibasilar crackles. Respirations shallow, no tachypnea.  Abdomen:Active BS Extremities:No LE edema. Feet mottled plantar surface of both feet under toes-multiple necrotic tips of toes. Dialysis Access:L AVF+T/B   Additional Objective Labs: Basic Metabolic Panel: Recent Labs  Lab 08/10/19 0500 08/10/19 1030 08/11/19 0719  NA 133* 135 135  K 3.8 4.1 4.3  CL 96* 98 97*  CO2 _0 GLUCOSE 86 186* 93  BUN 37* 39* 52*  CREATININE 3.68* 4.06* 5.06*  CALCIUM 8.1* 8.3* 8.7*  PHOS  --  5.1* 6.2*   Liver Function Tests: Recent Labs  Lab 08/05/19 0500 08/05/19 0500 08/06/19 0540 08/10/19 1030 08/11/19 0719  AST 62*  --  35  --   --   ALT 222*  --  145*  --   --   ALKPHOS 86  --  75  --   --   BILITOT 1.0  --  1.1  --   --   PROT 5.0*  --  4.5*  --   --   ALBUMIN 2.1*   < > 2.0* 1.6* 1.8*   < > = values in this interval not displayed.   No results for input(s): LIPASE, AMYLASE in the last 168 hours. CBC: Recent Labs  Lab 08/06/19 0540 08/06/19 0540 08/07/19 0333 08/07/19 0333 08/08/19 0541 08/09/19 0500 08/10/19 0500  WBC 10.3   < > 7.9   < > 7.6 8.5 8.2  NEUTROABS  --   --  6.2   < > 6.1 6.9 6.3  HGB 7.3*   < > 8.8*   < > 9.6* 9.6* 8.3*  HCT 25.1*   < > 28.7*   < > 30.8* 31.3* 28.1*  MCV 98.8  --  92.9  --  94.2 92.9 95.9  PLT 133*   < > 94*   < > 111* 140* 156   < > = values in this  interval not displayed.   Blood Culture    Component Value Date/Time   SDES TRACHEAL ASPIRATE 08/01/2019 1559   SPECREQUEST Normal 08/01/2019 1559   CULT  08/01/2019 1559    FEW Consistent with normal respiratory flora. Performed at [facility], [place] [address] [place], [place] [address]    REPTSTATUS 08/04/2019 FINAL 08/01/2019 1559    Cardiac Enzymes: No results for input(s): CKTOTAL, CKMB, CKMBINDEX, TROPONINI in the last 168 hours. CBG: Recent Labs  Lab 08/07/19 0412 08/07/19 0829 08/07/19 1231 08/07/19 1612 08/09/19 1714  GLUCAP 120* 84 92 178* 102*   Iron Studies: No results for input(s): IRON, TIBC, TRANSFERRIN, FERRITIN in the last 72 hours. _1 @ Studies/Results: CT HEAD WO CONTRAST  Result Date: 08/09/2019 CLINICAL DATA:  Asystolic arrest 29/02/1113, hyperkalemia, metabolic encephalopathy, history of subdural hematoma EXAM: CT HEAD WITHOUT CONTRAST TECHNIQUE: Contiguous axial images were obtained from the base of the  skull through the vertex without intravenous contrast. COMPARISON:  08/05/2019, 08/04/2019 FINDINGS: Brain: The trace falcine subdural hematoma is again identified with decreased prominence since prior study. No new areas of hemorrhage. Scattered hypodensities are seen within the bilateral cortex, bilateral basal ganglia, and bilateral cerebellar hemispheres, corresponding to the multifocal small infarcts seen on recent MRI. The lateral ventricles and midline structures are stable. No mass effect. Vascular: No hyperdense vessel or unexpected calcification. Skull: Normal. Negative for fracture or focal lesion. Sinuses/Orbits: No acute finding. Other: None. IMPRESSION: 1. Stable small falcine subdural hematoma, decreased in prominence since prior study. No mass effect. 2. 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 No change since recent MRI. 3. No evidence  of parenchymal hemorrhage. Electronically Signed   By: Randa Ngo M.D.   On: 08/09/2019 17:35   MR BRAIN WO CONTRAST  Result Date: 08/10/2019 CLINICAL DATA:  Stroke follow-up EXAM: MRI HEAD WITHOUT CONTRAST TECHNIQUE: Multiplanar, multiecho pulse sequences of the brain and surrounding structures were obtained without intravenous contrast. COMPARISON:  MRI of the brain August 05, 2019 FINDINGS: The study is partially degraded by motion. Brain: Multiple foci of restricted diffusion are seen within the bilateral of cerebral and cerebellar hemispheres, right thalamus and right caudate, less evident than on prior MRI, likely due to pseudo normalization in some of the lesions. The lesions in the bilateral cerebral hemisphere appear to be predominantly in watershed distribution. Some of the lesions show susceptibility artifact os 1, consistent with microhemorrhages. Extensive confluent T2 hyperintensity of the white matter of the cerebral hemispheres and within the pons, nonspecific, most likely related to chronic small vessel ischemia. 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 Vascular: Normal flow voids. Skull and upper cervical spine: Advanced degenerative changes with spinal canal stenosis at C3-4, partially evaluated. Sinuses/Orbits: Mucosal thickening of the ethmoid cells and frontal sinuses the orbits are maintained. Other: Bilateral mastoid effusion. IMPRESSION: 1. Multiple foci of restricted diffusion within the bilateral cerebral and cerebellar hemispheres, right thalamus and right caudate, less evident than on prior MRI, likely due to pseudo normalization in some of the lesions. The lesions in the bilateral cerebral hemisphere appear to be predominantly in watershed distribution. 2. Extensive confluent T2 hyperintensity of the white matter of the cerebral hemispheres and pons, nonspecific, most likely related to chronic small vessel ischemia. 3. Remote infarcts in the  bilateral frontoparietal regions and occipital lobes. 4. Bilateral mastoid effusion. Electronically Signed   By: Pedro Earls M.D.   On: 08/10/2019 11:17   EEG adult  Result Date: 08/09/2019 Lora Havens, MD     08/09/2019  6:01 PM Patient Name: Christina [name] MRN: [id number] Epilepsy Attending: Lora Havens Referring Physician/Provider: Dr Lala Lund Date: 08/09/2019 Duration: 24.7 mins Patient history: 69 yo F with SDH and multifocal acute infarcts. EEG to evaluate for seizure. Level of alertness: lethargic AEDs during EEG study: Gabapentin Technical aspects: This EEG study was done with scalp electrodes positioned according to the 10-20 International system of electrode placement. Electrical activity was acquired at a sampling rate of _0  and reviewed with a high frequency filter of _1  and a low frequency filter of _2 . EEG data were recorded continuously and digitally stored. Description: EEG showed continuous generalized 3 to 6 Hz theta-delta slowing. Hyperventilation and photic stimulation were not performed.   ABNORMALITY -Continuous slow, generalized IMPRESSION: This study is suggestive of moderate diffuse encephalopathy, nonspecific etiology. No  seizures or epileptiform discharges were seen throughout the recording. Christina [name]   Medications:   sodium chloride   Intravenous Once   amiodarone  200 mg Oral BID   amLODipine  10 mg Oral Daily   arformoterol  15 mcg Nebulization BID   aspirin  81 mg Oral Daily   atorvastatin  40 mg Oral Daily   bisacodyl  10 mg Oral Daily   budesonide (PULMICORT) nebulizer solution  0.5 mg Nebulization BID   carvedilol  6.25 mg Oral BID WC   chlorhexidine gluconate (MEDLINE KIT)  15 mL Mouth Rinse BID   Chlorhexidine Gluconate Cloth  6 each Topical Q0600   Chlorhexidine Gluconate Cloth  6 each Topical Q0600   Chlorhexidine Gluconate Cloth  6 each Topical Q0600   darbepoetin (ARANESP) injection - DIALYSIS   60 mcg Intravenous Q Tue-HD   feeding supplement (NEPRO CARB STEADY)  237 mL Oral TID BM   gabapentin  300 mg Oral BID   heparin injection (subcutaneous)  5,000 Units Subcutaneous Q8H   lidocaine  1 patch Transdermal Q24H   mouth rinse  15 mL Mouth Rinse BID   multivitamin  1 tablet Oral QHS   neomycin-bacitracin-polymyxin   Topical BID   pantoprazole  40 mg Oral BID   sodium chloride flush  10-40 mL Intracatheter Q12H     OP GN:OIBB KC MWF 4h 40kg 2/2 bath Hep none LUE AVF mircera 200 ug last 6/30 venofer 100 tiw calc 0.75 tiw  Summary: Pt is a27 y.o.yo femalewith h/o HTN, CAD s/p stent, A. fib, anxiety depression, anemia, SBO, mesenteric ischemia s/p SMA stenting, multiple recent admissions,[facility] cardiac arrest seen as a consultation for the management of ESRD.  Assessment/ Plan: 1. Cardiac arrest/V. tach: Probably contributed by hyperkalemia. Stable post arrest but declining physically.  Pt is failing to thrive in long-term and short-term, will consult palliative care team. We would advise further aggressive care (includilng dialysis) will not improve quantity or quality of life and we should transition to comfort care at this time.  Have d/w primary md.  2. Acute/ subacute LE DVT:IVC filter placed in IR 08/05/2019.  3. ESRD: MWF HD. AV fistula for the access.Has been having HD on T,Th,S schedule D/T hospital HD schedule. No immediate needs for HD today. Will resume MWF 08/12/2019 2nd shift.  No heparin. 4. HTN/ volume overload:diffuse edemalast week-was up 9kg over dry wt. Post wt 47.8 kg 07/13. Still above EDW. Continue lowering volume as tolerated.  5. Anemia of ESRD:HGB 8.3 08/09/2019.Rec'd Aranesp 60 mcg IV with HD 04/88/8916.   6. 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  7. Subdural hematoma. Avoid heparin.Repeat CT of head scheduled 08/11/2019. If SDH stable,  possibly resume plavix. EEG done 07/13 shows diffuse encephalopathy nonspecific etiology.  8. H/O PAF-not a candidate for anticoagulation BEFORE SDH D/T compliance issues. Per primary. 9. GOC-currently DNR. She is declining. Have recommended hospice. Palliative care seeing today.   Bodin Gorka H. Tanisha Lutes NP-C 08/11/2019, 10:50 AM  [employer] [phone]

## 2019-08-11 NOTE — Consult Note (Signed)
Consultation Note Date: 08/11/2019   Patient Name: Christina [name]  DOB: [birth year]  MRN: [id number]  Age / Sex: 69 y.o., female  PCP: Nodal, [name], PA-C Referring Physician: Shelly Coss, MD  Reason for Consultation: Establishing goals of care and symptom management  HPI/Patient Profile: 69 y.o. female  with past medical history of ESRD on HD, PAD s/p SMA stent, SBO, CAD s/p stenting, COPD, Afib, GIB, h/o polysubstance abuse, PUD, and concern for ETOH cirrhosis who was admitted on 08/01/2019 after a PEA arrest at [facility].  She required 10 minutes of ACLS before ROSC.  During the hospitalization she was found to have a trace SDH, RLE DVT now sp IVC placement, and numerous small infarcts bilaterally.  She has received 1 unit PRBC, and there has been concern for aspiration pneumonia.  This is her 10th admission in 6 months.   Her legs move continuously during our conversation.  Clinical Assessment and Goals of Care:  I have reviewed medical records including EPIC notes, labs and imaging, received report from Dr. Jonnie Finner and RN Meredith Mody, examined the patient and talked with her at bedside  to discuss diagnosis prognosis, GOC, EOL wishes, disposition and options.  I attempted to call the two numbers on face sheet but did not get an answer.  Left voice mail messages for a call back on both.  I introduced Palliative Medicine as specialized medical care for people living with serious illness. It focuses on providing relief from the symptoms and stress of a serious illness.  We discussed a brief life review of the patient. Christina [name] used to work as a Emergency planning/management officer.  She has 4 sisters - and states two are very supportive.  She tells me she had 4 children but two of them died.  She tells me she lives with Joseph Art her daughter.  Rondi is complaining of sharp chest pain in her central chest, as well as leg  pain.  She states that she is not sleeping and her bowels are not moving.  States she is eating well.  Rolonda says that "hemodialysis is rough but I'm doing ok with it".  I ask about her concerns.  She states I'm afraid I'm dying.  I asked what happens after you die and she replies that she does not know.  She believes in God.  I tried to provide reassurance that we would get her feeling better.    Questions and concerns were addressed.  The family was encouraged to call with questions or concerns.        Primary Decision Maker:  PATIENT was unable to give a surrogate decision maker.   She seems somewhat confused.    SUMMARY OF RECOMMENDATIONS    Continue current management  Will address symptom management as below.  Would like to have family present for a conversation about stopping HD and considering [place].  Afraid of dying - have consulted Palliative chaplain for support.  Code Status/Advance Care Planning:  DNR   Symptom Management:   Anxiety,  akathisia, insomnia - patient has been on xanax in the past.  Will use PRN  Chest pain - lidocaine patch  Leg pain - will increase hydrocodone to 1-2 mg q4 PRN  Constipation - bisacodyl 10 mg daily    Palliative Prophylaxis:   Frequent Pain Assessment  Psycho-social/Spiritual:   Desire for further Chaplaincy support: requested.  Prognosis:  In the setting of PEA Arrest and 10 admissions in 6 months -  If she stops HD it will be days to weeks.  If she continues HD she likely has weeks to months.  Discharge Planning: To Be Determined      Primary Diagnoses: Present on Admission: . Cardiac arrest ([place])   I have reviewed the medical record, interviewed the patient and family, and examined the patient. The following aspects are pertinent.  Past Medical History:  Diagnosis Date  . Anemia   . Anxiety   . Arthritis   . Asthma   . Atrial fibrillation ([place])   . Chronic kidney disease    Dialysis  T/Th/Sa  started in March 2020  . COPD (chronic obstructive pulmonary disease) ([place])   . Coronary artery disease    2 stents  . Depression   . GERD (gastroesophageal reflux disease)   . GI bleeding 06/15/2019  . Gout   . Headache    migraines  . History of kidney stones   . Hyperlipidemia   . Hypertension   . Pneumonia   . Small bowel obstruction ([facility])    Social History   Socioeconomic History  . Marital status: Single    Spouse name: Not on file  . Number of children: Not on file  . Years of education: Not on file  . Highest education level: Not on file  Occupational History  . Occupation: retired  Tobacco Use  . Smoking status: Former Smoker    Packs/day: 0.50    Years: 50.00    Pack years: 25.00    Quit date: 2019    Years since quitting: 2.5  . Smokeless tobacco: Never Used  Vaping Use  . Vaping Use: Never used  Substance and Sexual Activity  . Alcohol use: Not Currently    Comment: quit 2019 - "I was a drunk"  . Drug use: Not Currently    Types: Marijuana    Comment: h/o drug use - "I was raised in the 60s"; last use 18 months ago; smokes marijuana periodically  . Sexual activity: Not on file  Other Topics Concern  . Not on file  Social History Narrative  . Not on file   Social Determinants of Health   Financial Resource Strain:   . Difficulty of Paying Living Expenses:   Food Insecurity:   . Worried About Charity fundraiser in the Last Year:   . Arboriculturist in the Last Year:   Transportation Needs:   . Film/video editor (Medical):   Marland Kitchen Lack of Transportation (Non-Medical):   Physical Activity:   . Days of Exercise per Week:   . Minutes of Exercise per Session:   Stress:   . Feeling of Stress :   Social Connections:   . Frequency of Communication with Friends and Family:   . Frequency of Social Gatherings with Friends and Family:   . Attends Religious Services:   . Active Member of Clubs or Organizations:   . Attends Theatre manager Meetings:   Marland Kitchen Marital Status:    Family History  Problem Relation Age  of Onset  . Ulcers Sister        sick a long time, improved after surgery    Allergies  Allergen Reactions  . Betaine Anaphylaxis  . Cyclobenzaprine Anaphylaxis and Other (See Comments)    "stopped heart"   . Morphine Anaphylaxis and Other (See Comments)    "stopped heart"  . Penicillins Shortness Of Breath, Swelling and Palpitations    Did it involve swelling of the face/tongue/throat, SOB, or low BP? Yes Did it involve sudden or severe rash/hives, skin peeling, or any reaction on the inside of your mouth or nose? No Did you need to seek medical attention at a hospital or doctor's office? No When did it last happen?years  If all above answers are "NO", may proceed with cephalosporin use.   . Ambien [Zolpidem] Other (See Comments)    Severe confusion  . Codeine Itching and Rash  . Hydromorphone Other (See Comments)    If administered quickly, felt like hand was "exploding"       Vital Signs: BP (!) 167/58   Pulse 80   Temp 98.4 F (36.9 C) (Oral)   Resp 20   Ht 5\' 2"  (1.575 m)   Wt 47.5 kg   SpO2 94%   BMI 19.15 kg/m  Pain Scale: 0-10 POSS *See Group Information*: 1-Acceptable,Awake and alert Pain Score: 0-No pain   SpO2: SpO2: 94 % O2 Device:SpO2: 94 % O2 Flow Rate: .O2 Flow Rate (L/min): 2 L/min    Palliative Assessment/Data: 30%     Time In: 9:00 Time Out: 10:00 Time Total: 60 min. Visit consisted of counseling and education dealing with the complex and emotionally intense issues surrounding the need for palliative care and symptom management in the setting of serious and potentially life-threatening illness. Greater than 50%  of this time was spent counseling and coordinating care related to the above assessment and plan.  Signed by: Florentina Jenny, PA-C Palliative Medicine  Please contact Palliative Medicine Team phone at [phone] for questions and  concerns.  For individual provider: See Shea Evans

## 2019-08-11 NOTE — Progress Notes (Signed)
Met w/ daughters and patient and plan is for finding a hospice group who can facilitate home hospice for Christina [name] and her family.  No further dialysis. Expected death within a couple of weeks.  No further suggestions, will sign off.   Kelly Splinter, MD 08/11/2019, 3:53 PM

## 2019-08-11 NOTE — Progress Notes (Addendum)
PROGRESS NOTE    LOUINE TENPENNY  VFI:[id number] DOB: 05/21/1950 DOA: 08/01/2019 PCP: Maggie Schwalbe, PA-C   Brief Narrative:  Patient is a 69 year old female with history of ESRD, noncompliant with dialysis on multiple occasions, coronary disease status post stents, peripheral artery disease status post left carotid endarterectomy, hypertension, hyperlipidemia, alcohol abuse, GI bleed, COPD, GERD, duodenal ulcers, anemia of chronic disease, medication noncompliance was brought to the [facility] ER after she had a diastolic arrest at home with 10-minute downtime requiring CPR thought to be secondary to hyperkalemia.  She was intubated and transferred to [facility] under PCCM service.  Hospital course remarkable for right-sided arm weakness and imaging studies showed stable small subdural hematoma.  She was successfully extubated and transferred to hospital service on 08/05/2019.  Due to her multiple comorbidities, noncompliance, poor oral intake, end-stage renal disease, waiting that hospice will be the best option for her.  Palliative care consulted and having a meeting with family at 3 today.  Assessment & Plan:   Active Problems:   Cardiac arrest [facility])   Medical non-compliance   Malnutrition of moderate degree   Cardiac arrest with subsequent respiratory failure: Suspected to be from hyperkalemia due to noncompliance with dialysis.  Currently hemodynamically stable.  She had to be intubated after being given CPR, successfully extubated.  Echocardiogram showed ejection fraction of 55% with no regional wall motion abnormality.  Coronary artery disease: Status post stent placement in the past, history of paroxysmal A. fib, peripheral artery disease: Status post SMA stent, left carotid endarterectomy.  Currently on statin Coreg.  Also on aspirin.  Plavix restarted .  ESRD: Being dialyzed on TTS schedule.  Nephrology on board.  Nephrology recommended to stop dialysis because of poor overall  prognosis  Acute metabolic encephalopathy: Status post cardiac arrest.  Currently alert and awake.  Continue supportive care  Subdural hematoma: Seen on the CT head.  She developed new right arm weakness earlier.  Neurology was consulted.  Started on statin, being continued on aspirin and plavix.   CT head done on 08/11/19 on showed improving SDH,no new bleeding  Hypertension: Currently blood was stable.  On Coreg.  Monitor blood pressure  Normocytic anemia/chronic thrombocytopenia: Most likely associated  with her chronic kidney disease.  She also has possible alcoholic cirrhosis.  She was transfused with a unit of  PRBC during this hospitalization.  Currently H&H stable.  Severe protein calorie malnutrition: Continue nutrition supplements.  History of proximal A. fib: Continue beta-blocker.  Currently rate is well controlled.Chads vasc score  2 score of greater than 4.  Not on anticoagulation due to history of severe noncompliance as well as recent history of upper GI bleed.  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  She is also very noncompliant.  Received IVC filter by IR on 08/05/2019.  Hyperglycemia/intermittent episodes of hypoglycemia: No history of diabetes.  A1c is 4.9.  Multiple drops in the blood sugars also noted.  Monitor sugars.  Hypoglycemic episodes are most likely due to poor oral intake.  Insulin has been stopped.  C-peptide high , insulin level normal.  Cortisol normal and TSH is 6.1.  Suspected aspiration pneumonia: Likely secondary to encephalopathy.  Treated with Maxipime and Flagyl.  Currently on soft diet.  Aspiration precautions.  Goals of care: Overall very poor prognosis with multiple comorbidities, along history of noncompliance.  Family has been thoroughly updated by my colleagues.  Currently she is DNR.  I think the best approach for her will be comfort care.   Palliative care consulted and having a family meeting today  Debility/deconditioning: Patient seen by PT/OT and recommended to skilled nursing facility on discharge.           Nutrition Problem: Moderate Malnutrition Etiology: chronic illness (ESRD)      DVT prophylaxis:Heparin [place] Code Status: DNR Family Communication: None Status is: Inpatient  Remains inpatient appropriate because:Unsafe d/c plan   Dispo: The patient is from: Home              Anticipated d/c is CB:[id number] Hospice vs  SNF              Anticipated d/c date is:1- 3 days              Patient currently is not medically stable to d/c.     Consultants: PCCM, nephro, neuro Intubation/extubation, central line placement, IVC filter placement Procedures:  Antimicrobials:  Anti-infectives (From admission, onward)   Start     Dose/Rate Route Frequency Ordered Stop   08/09/19 1800  ceFEPIme (MAXIPIME) 2 g in sodium chloride 0.9 % 100 mL IVPB        2 g 200 mL/hr over 30 Minutes Intravenous Every T-Th-Sa (Hemodialysis) 08/07/19 1054 08/09/19 1803   08/06/19 1800  ceFEPIme (MAXIPIME) 2 g in sodium chloride 0.9 % 100 mL IVPB  Status:  Discontinued        2 g 200 mL/hr over 30 Minutes Intravenous Every T-Th-Sa (Hemodialysis) 08/06/19 1034 08/07/19 1054   08/06/19 1200  metroNIDAZOLE (FLAGYL) IVPB 500 mg        500 mg 100 mL/hr over 60 Minutes Intravenous Every 8 hours 08/06/19 1034 08/10/19 1823      Subjective: Patient seen and examined at the bedside this morning.  Hemodynamically stable.  Remains weak, lying on the bed.  Back pain has improved but her chest pain/pressure is stable.  Was not in any kind of distress.  Objective: Vitals:   08/11/19 0413 08/11/19 0543 08/11/19 0647 08/11/19 0802  BP: (!) 157/53 (!) 167/58    Pulse: 71 73 80   Resp:  20    Temp:  98.4 F (36.9 C)    TempSrc:  Oral    SpO2: 96% 96% 97% 94%  Weight:   47.5 kg   Height:        Intake/Output Summary (Last 24 hours)  at 08/11/2019 0808 Last data filed at 08/11/2019 0651 Gross per 24 hour  Intake 240 ml  Output 675 ml  Net -435 ml   Filed Weights   08/09/19 1130 08/10/19 0403 08/11/19 0647  Weight: 47.8 kg 49.6 kg 47.5 kg    Examination:  General exam: Extremely deconditioned, debilitated, generalized weakness Respiratory system: no wheezes or crackles from anterior auscultation  cardiovascular system: S1 & S2 heard, RRR. No JVD, murmurs, rubs, gallops or clicks.Central line on the left chest Gastrointestinal system: Abdomen is nondistended, soft and nontender. No organomegaly or masses felt. Normal bowel sounds heard. Central nervous system: Alert and oriented.  Weakness in bilateral upper extremities Extremities: No edema, no clubbing ,no cyanosis, AV graft on the left arm, necrotic toes, petechial rashes on the foot bilaterally Skin: No no icterus ,no pallor    Data Reviewed: I have personally reviewed following labs and imaging studies  CBC: Recent Labs  Lab 08/06/19 0540 08/07/19 0333 08/08/19 0541 08/09/19 0500 08/10/19 0500  WBC 10.3 7.9 7.6 8.5 8.2  NEUTROABS  --  6.2 6.1 6.9 6.3  HGB 7.3* 8.8* 9.6* 9.6* 8.3*  HCT 25.1* 28.7* 30.8* 31.3* 28.1*  MCV 98.8 92.9 94.2 92.9 95.9  PLT 133* 94* 111* 140* 502   Basic Metabolic Panel: Recent Labs  Lab 08/07/19 0333 08/07/19 0333 08/08/19 0541 08/09/19 0500 08/10/19 0500 08/10/19 1030 08/11/19 0719  NA 136   < > 133* 135 133* 135 135  K 4.4   < > 4.6 4.8 3.8 4.1 4.3  CL 98   < > 97* 96* 96* 98 97*  CO2 27   < > '24 24 27 27 26  ' GLUCOSE 138*   < > 175* 85 86 186* 93  BUN 37*   < > 54* 68* 37* 39* 52*  CREATININE 4.01*   < > 5.29* 5.96* 3.68* 4.06* 5.06*  CALCIUM 7.4*   < > 8.1* 8.5* 8.1* 8.3* 8.7*  MG 1.6*  --  2.3  --   --   --   --   PHOS  --   --   --   --   --  5.1* 6.2*   < > = values in this interval not displayed.   GFR: Estimated Creatinine Clearance: 7.9 mL/min (A) (by C-G formula based on SCr of 5.06 mg/dL  (H)). Liver Function Tests: Recent Labs  Lab 08/05/19 0500 08/06/19 0540 08/10/19 1030 08/11/19 0719  AST 62* 35  --   --   ALT 222* 145*  --   --   ALKPHOS 86 75  --   --   BILITOT 1.0 1.1  --   --   PROT 5.0* 4.5*  --   --   ALBUMIN 2.1* 2.0* 1.6* 1.8*   No results for input(s): LIPASE, AMYLASE in the last 168 hours. No results for input(s): AMMONIA in the last 168 hours. Coagulation Profile: No results for input(s): INR, PROTIME in the last 168 hours. Cardiac Enzymes: No results for input(s): CKTOTAL, CKMB, CKMBINDEX, TROPONINI in the last 168 hours. BNP (last 3 results) No results for input(s): PROBNP in the last 8760 hours. HbA1C: Recent Labs    08/09/19 0738  HGBA1C 4.4*   CBG: Recent Labs  Lab 08/07/19 0412 08/07/19 0829 08/07/19 1231 08/07/19 1612 08/09/19 1714  GLUCAP 120* 84 92 178* 102*   Lipid Profile: No results for input(s): CHOL, HDL, LDLCALC, TRIG, CHOLHDL, LDLDIRECT in the last 72 hours. Thyroid Function Tests: No results for input(s): TSH, T4TOTAL, FREET4, T3FREE, THYROIDAB in the last 72 hours. Anemia Panel: No results for input(s): VITAMINB12, FOLATE, FERRITIN, TIBC, IRON, RETICCTPCT in the last 72 hours. Sepsis Labs: Recent Labs  Lab 08/06/19 1010 08/07/19 0333 08/08/19 0541  PROCALCITON 1.45 1.35 1.03    Recent Results (from the past 240 hour(s))  SARS Coronavirus 2 by RT PCR (hospital order, performed in [facility] hospital lab) Nasopharyngeal Nasopharyngeal Swab     Status: None   Collection Time: 08/01/19 12:08 PM   Specimen: Nasopharyngeal Swab  Result Value Ref Range Status   SARS Coronavirus 2 NEGATIVE NEGATIVE Final    Comment: (NOTE) SARS-CoV-2 target nucleic acids are NOT DETECTED.  The SARS-CoV-2 RNA is generally detectable in upper and lower respiratory specimens during the acute phase of infection. The lowest concentration of SARS-CoV-2 viral copies this assay can detect is 250 copies / mL. A negative result does not  preclude SARS-CoV-2 infection and should not be used as the sole basis for treatment or other patient management decisions.  A negative  result may occur with improper specimen collection / handling, submission of specimen other than nasopharyngeal swab, presence of viral mutation(s) within the areas targeted by this assay, and inadequate number of viral copies (<250 copies / mL). A negative result must be combined with clinical observations, patient history, and epidemiological information.  Fact Sheet for Patients:   StrictlyIdeas.no  Fact Sheet for Healthcare Providers: BankingDealers.co.za  This test is not yet approved or  cleared by the [place] FDA and has been authorized for detection and/or diagnosis of SARS-CoV-2 by FDA under an Emergency Use Authorization (EUA).  This EUA will remain in effect (meaning this test can be used) for the duration of the COVID-19 declaration under Section 564(b)(1) of the Act, 21 U.S.C. section 360bbb-3(b)(1), unless the authorization is terminated or revoked sooner.  Performed at [facility], [place] [address] [place], [place] [address]   Culture, blood (Routine X 2) w Reflex to ID Panel     Status: None   Collection Time: 08/01/19 12:44 PM   Specimen: BLOOD RIGHT ARM  Result Value Ref Range Status   Specimen Description BLOOD RIGHT ARM  Final   Special Requests   Final    BOTTLES DRAWN AEROBIC ONLY Blood Culture results may not be optimal due to an inadequate volume of blood received in culture bottles   Culture   Final    NO GROWTH 5 DAYS Performed at [facility], [place] [address] [place], [place] [address]    Report Status 08/06/2019 FINAL  Final  MRSA PCR Screening     Status: None   Collection Time: 08/01/19 12:45 PM  Result Value Ref Range Status   MRSA by PCR NEGATIVE NEGATIVE Final    Comment:        The GeneXpert MRSA Assay (FDA approved for NASAL specimens only),  is one component of a comprehensive MRSA colonization surveillance program. It is not intended to diagnose MRSA infection nor to guide or monitor treatment for MRSA infections. Performed at [facility], [place] [address] [place], [place] [address]   Culture, blood (Routine X 2) w Reflex to ID Panel     Status: None   Collection Time: 08/01/19 12:48 PM   Specimen: BLOOD RIGHT WRIST  Result Value Ref Range Status   Specimen Description BLOOD RIGHT WRIST  Final   Special Requests   Final    BOTTLES DRAWN AEROBIC ONLY Blood Culture adequate volume   Culture   Final    NO GROWTH 5 DAYS Performed at [name], [address], [place], [place] [address]    Report Status 08/06/2019 FINAL  Final  Culture, respiratory (non-expectorated)     Status: None   Collection Time: 08/01/19  3:59 PM   Specimen: Tracheal Aspirate; Respiratory  Result Value Ref Range Status   Specimen Description TRACHEAL ASPIRATE  Final   Special Requests Normal  Final   Gram Stain   Final    FEW WBC PRESENT, PREDOMINANTLY PMN ABUNDANT GRAM POSITIVE COCCI RARE GRAM NEGATIVE RODS RARE GRAM POSITIVE RODS    Culture   Final    FEW Consistent with normal respiratory flora. Performed at [facility], [place] [address] [place], [place] [address]    Report Status 08/04/2019 FINAL  Final         Radiology Studies: CT HEAD WO CONTRAST  Result Date: 08/09/2019 CLINICAL DATA:  Asystolic arrest 27/74/1287, hyperkalemia, metabolic encephalopathy, history of subdural hematoma EXAM: CT HEAD  WITHOUT CONTRAST TECHNIQUE: Contiguous axial images were obtained from the base of the skull through the vertex without intravenous contrast. COMPARISON:  08/05/2019, 08/04/2019 FINDINGS: Brain: The trace falcine subdural hematoma is again identified with decreased prominence since prior study. No new areas of hemorrhage. Scattered hypodensities are seen within the bilateral cortex, bilateral basal ganglia, and  bilateral cerebellar hemispheres, corresponding to the multifocal small infarcts seen on recent MRI. The lateral ventricles and midline structures are stable. No mass effect. Vascular: No hyperdense vessel or unexpected calcification. Skull: Normal. Negative for fracture or focal lesion. Sinuses/Orbits: No acute finding. Other: None. IMPRESSION: 1. Stable small falcine subdural hematoma, decreased in prominence since prior study. No mass effect. 2. 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 No change since recent MRI. 3. No evidence of parenchymal hemorrhage. Electronically Signed   By: Randa Ngo M.D.   On: 08/09/2019 17:35   MR BRAIN WO CONTRAST  Result Date: 08/10/2019 CLINICAL DATA:  Stroke follow-up EXAM: MRI HEAD WITHOUT CONTRAST TECHNIQUE: Multiplanar, multiecho pulse sequences of the brain and surrounding structures were obtained without intravenous contrast. COMPARISON:  MRI of the brain August 05, 2019 FINDINGS: The study is partially degraded by motion. Brain: Multiple foci of restricted diffusion are seen within the bilateral of cerebral and cerebellar hemispheres, right thalamus and right caudate, less evident than on prior MRI, likely due to pseudo normalization in some of the lesions. The lesions in the bilateral cerebral hemisphere appear to be predominantly in watershed distribution. Some of the lesions show susceptibility artifact os 1, consistent with microhemorrhages. Extensive confluent T2 hyperintensity of the white matter of the cerebral hemispheres and within the pons, nonspecific, most likely related to chronic small vessel ischemia. 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 Vascular: Normal flow voids. Skull and upper cervical spine: Advanced degenerative changes with spinal canal stenosis at C3-4, partially evaluated. Sinuses/Orbits: Mucosal thickening of  the ethmoid cells and frontal sinuses the orbits are maintained. Other: Bilateral mastoid effusion. IMPRESSION: 1. Multiple foci of restricted diffusion within the bilateral cerebral and cerebellar hemispheres, right thalamus and right caudate, less evident than on prior MRI, likely due to pseudo normalization in some of the lesions. The lesions in the bilateral cerebral hemisphere appear to be predominantly in watershed distribution. 2. Extensive confluent T2 hyperintensity of the white matter of the cerebral hemispheres and pons, nonspecific, most likely related to chronic small vessel ischemia. 3. 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 4. Bilateral mastoid effusion. Electronically Signed   By: Pedro Earls M.D.   On: 08/10/2019 11:17   EEG adult  Result Date: 08/09/2019 Lora Havens, MD     08/09/2019  6:01 PM Patient Name: NAJMO PARDUE MRN: [id number] Epilepsy Attending: Lora Havens Referring Physician/Provider: Dr Lala Lund Date: 08/09/2019 Duration: 24.7 mins Patient history: 69 yo F with SDH and multifocal acute infarcts. EEG to evaluate for seizure. Level of alertness: lethargic AEDs during EEG study: Gabapentin Technical aspects: This EEG study was done with scalp electrodes positioned according to the 10-20 International system of electrode placement. Electrical activity was acquired at a sampling rate of '500Hz'  and reviewed with a high frequency filter of '70Hz'  and a low frequency filter of '1Hz' . EEG data were recorded continuously and digitally stored. Description: EEG showed continuous generalized 3 to 6 Hz theta-delta slowing. Hyperventilation and photic stimulation were not performed.   ABNORMALITY -Continuous slow,  generalized IMPRESSION: This study is suggestive of moderate diffuse encephalopathy, nonspecific etiology. No seizures or epileptiform discharges were seen throughout the recording. Priyanka [name]         Scheduled Meds: . sodium chloride   Intravenous Once  . amiodarone  200 mg Oral BID  . amLODipine  10 mg Oral Daily  . arformoterol  15 mcg Nebulization BID  . aspirin  81 mg Oral Daily  . atorvastatin  40 mg Oral Daily  . budesonide (PULMICORT) nebulizer solution  0.5 mg Nebulization BID  . carvedilol  6.25 mg Oral BID WC  . chlorhexidine gluconate (MEDLINE KIT)  15 mL Mouth Rinse BID  . Chlorhexidine Gluconate Cloth  6 each Topical Q0600  . Chlorhexidine Gluconate Cloth  6 each Topical Q0600  . Chlorhexidine Gluconate Cloth  6 each Topical Q0600  . darbepoetin (ARANESP) injection - DIALYSIS  60 mcg Intravenous Q Tue-HD  . feeding supplement (NEPRO CARB STEADY)  237 mL Oral TID BM  . gabapentin  300 mg Oral BID  . heparin injection (subcutaneous)  5,000 Units Subcutaneous Q8H  . mouth rinse  15 mL Mouth Rinse BID  . multivitamin  1 tablet Oral QHS  . neomycin-bacitracin-polymyxin   Topical BID  . pantoprazole  40 mg Oral BID  . sodium chloride flush  10-40 mL Intracatheter Q12H   Continuous Infusions:    LOS: 10 days    Time spent: 25 mins.More than 50% of that time was spent in counseling and/or coordination of care.      Shelly Coss, MD [facility] P7/15/2021, 8:08 AM

## 2019-08-11 NOTE — Progress Notes (Signed)
°   08/11/19 1025  Clinical Encounter Type  Visited With Patient  Visit Type Follow-up;Spiritual support  Referral From Palliative care team  Consult/Referral To Chaplain  Spiritual Encounters  Spiritual Needs Emotional  Stress Factors  Patient Stress Factors Family relationships  This chaplain responded to PMT consult for Pt. spiritual care. The chaplain introduced herself to the Pt. RN-Gwen before the visit.  The RN requested assistance in identifying family or friend connects. The Pt. named her [name] as the person the Pt. would want to talk to or visit with.  The Pt. has other children and many brothers and sisters with whom the Pt shares she is not connected with.  The chaplain's reflective listening with the Pt. lead to the Pt. beginning a conversation about forgiveness.  The chaplain will continue F/U spiritual care as needed.

## 2019-08-12 ENCOUNTER — Other Ambulatory Visit: Payer: Self-pay

## 2019-08-12 DIAGNOSIS — K559 Vascular disorder of intestine, unspecified: Secondary | ICD-10-CM

## 2019-08-12 DIAGNOSIS — I739 Peripheral vascular disease, unspecified: Secondary | ICD-10-CM

## 2019-08-12 MED ORDER — AMLODIPINE BESYLATE 10 MG PO TABS: 10.0000 mg | ORAL_TABLET | Freq: Every day | ORAL | 1 refills | Status: AC

## 2019-08-12 MED ORDER — ALPRAZOLAM 0.5 MG PO TABS: 0.5000 mg | ORAL_TABLET | Freq: Three times a day (TID) | ORAL | 0 refills | Status: AC | PRN

## 2019-08-12 MED ORDER — HYDROCODONE-ACETAMINOPHEN 5-325 MG PO TABS: 1.0000 | ORAL_TABLET | Freq: Four times a day (QID) | ORAL | 0 refills | Status: AC | PRN

## 2019-08-12 NOTE — TOC Transition Note (Addendum)
Transition of Care [facility]) - CM/SW Discharge Note   Patient Details  Name: Christina [name] MRN: [id number] Date of Birth: 1950-06-09  Transition [facility]) CM/SW Contact:  Trula Ore, [place] Phone Number: 08/12/2019, 11:41 AM   Clinical Narrative:     Patient will DC to: daughters Home-[address] [place] [address]  Anticipated DC date: 08/12/2019  Family notified: Joseph Art  Transport by: Corey Harold  ?  Per MD patient ready for DC to daughters home with home hospice services . RN, patient, patient's family, and Roxeanne with [facility] notified of DC. DC packet on chart. Ambulance transport requested for patient.  CSW signing off.  Final next level of care: Home w Hospice Care Barriers to Discharge: No Barriers Identified   Patient Goals and CMS Choice Patient states their goals for this hospitalization and ongoing recovery are:: to go home with home with hospice CMS Medicare.gov Compare Post Acute Care list provided to:: Patient Represenative (must comment) ([name]) Choice offered to / list presented to : Adult Children [name])  Discharge Placement              Patient chooses bed at:  (home with hospice) Patient to be transferred to facility by: [place] Name of family member notified: daughter Joseph Art Patient and family notified of of transfer: 08/12/19  Discharge Plan and Services                                     Social Determinants of Health (SDOH) Interventions     Readmission Risk Interventions Readmission Risk Prevention Plan 07/05/2019 06/17/2019 03/29/2019  Transportation Screening Complete Complete Complete  Medication Review Press photographer) Complete Complete Complete  PCP or Specialist appointment within 3-5 days of discharge Complete - -  HRI or Home Care Consult Complete Complete Complete  SW Recovery Care/Counseling Consult Complete Complete Complete  Palliative Care Screening Not Applicable Not Applicable Not  Applicable  Skilled Nursing Facility Complete (No Data) Not Applicable  Some recent data might be hidden

## 2019-08-12 NOTE — Discharge Summary (Signed)
Physician Discharge Summary  MAJESTY [name] OAC:[id number] DOB: 20-Jan-1951 DOA: 08/01/2019  PCP: Maggie Schwalbe, PA-C  Admit date: 08/01/2019 Discharge date: 08/12/2019  Admitted From: Home Disposition:  Home with Hospice  Discharge Condition:Stable CODE STATUS: DNR Diet recommendation: Regular  Brief/Interim Summary: Patient is a 69 year old female with history of ESRD, noncompliant with dialysis on multiple occasions, coronary disease status post stents, peripheral artery disease status post left carotid endarterectomy, hypertension, hyperlipidemia, alcohol abuse, GI bleed, COPD, GERD, duodenal ulcers, anemia of chronic disease, medication noncompliance was brought to the [facility] ER after she had a diastolic arrest at home with 10-minute downtime requiring CPR thought to be secondary to hyperkalemia.  She was intubated and transferred to [facility] under PCCM service.  Hospital course remarkable for right-sided arm weakness and imaging studies showed stable small subdural hematoma.  She was successfully extubated and transferred to hospital service on 08/05/2019.  Due to her multiple comorbidities, noncompliance, poor oral intake, end-stage renal disease, palliative care consulted and decision made to discharge her with hospice services at home.  Following problems were addressed during her hospitalization:  Cardiac arrest with subsequent respiratory failure: Suspected to be from hyperkalemia due to noncompliance with dialysis.  Currently hemodynamically stable.  She had to be intubated after being given CPR, successfully extubated.  Echocardiogram showed ejection fraction of 55% with no regional wall motion abnormality.  Coronary artery disease: Status post stent placement in the past, history of paroxysmal A. fib, peripheral artery disease: Status post SMA stent, left carotid endarterectomy.  Currently was on statin, Coreg,plavix and aspirin   .  ESRD: Being dialyzed on TTS schedule.   Nephrology on board.  Nephrology recommended to stop dialysis because of poor overall prognosis  Acute metabolic encephalopathy: Status post cardiac arrest.  Currently alert and awake.  Continue supportive care  Subdural hematoma: Seen on the CT head.  She developed new right arm weakness earlier.  Neurology was consulted.    CT head done on 08/11/19 on showed improving SDH,no new bleeding  Hypertension: Currently blood was stable.  On Coreg.  Monitor blood pressure  Normocytic anemia/chronic thrombocytopenia: Most likely associated  with her chronic kidney disease.  She also has possible alcoholic cirrhosis.  She was transfused with a unit of  PRBC during this hospitalization.  Currently H&H stable.  Severe protein calorie malnutrition: Continue nutrition supplements.  History of proximal A. fib: Continue beta-blocker.  Currently rate is well controlled.Chads vasc score  2 score of greater than 4.  Not on anticoagulation due to history of severe noncompliance as well as recent history of upper GI bleed.  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  She is also very noncompliant.  Received IVC filter by IR on 08/05/2019.  Hyperglycemia/intermittent episodes of hypoglycemia: No history of diabetes.  A1c is 4.9.  Multiple drops in the blood sugars also noted.  Monitor sugars.  Hypoglycemic episodes are most likely due to poor oral intake.  Insulin has been stopped.  C-peptide high , insulin level normal.  Cortisol normal and TSH is 6.1.  Suspected aspiration pneumonia: Likely secondary to encephalopathy.  Treated with Maxipime and Flagyl.  Currently on soft diet.  Aspiration precautions.  Goals of care: Overall very poor prognosis with multiple comorbidities, along history of noncompliance.    Discharge Diagnoses:  Active Problems:   Cardiac arrest [facility])   Medical non-compliance    Malnutrition of moderate degree  Discharge Instructions  Discharge Instructions    Ambulatory referral to Neurology   Complete by: As directed    Follow up with Dr. Leonie Man at [facility] in 4-6 weeks. Too complicated for NP to follow. Thanks.   Diet - low sodium heart healthy   Complete by: As directed    Discharge instructions   Complete by: As directed    1)Take prescribed medications as instructed. 2)Follow up with Hospice services   Increase activity slowly   Complete by: As directed    No wound care   Complete by: As directed      Allergies as of 08/12/2019      Reactions   Betaine Anaphylaxis   Cyclobenzaprine Anaphylaxis, Other (See Comments)   "stopped heart"   Morphine Anaphylaxis, Other (See Comments)   "stopped heart"   Penicillins Shortness Of Breath, Swelling, Palpitations   Did it involve swelling of the face/tongue/throat, SOB, or low BP? Yes Did it involve sudden or severe rash/hives, skin peeling, or any reaction on the inside of your mouth or nose? No Did you need to seek medical attention at a hospital or doctor's office? No When did it last happen?years  If all above answers are "NO", may proceed with cephalosporin use.   Ambien [zolpidem] Other (See Comments)   Severe confusion   Codeine Itching, Rash   Hydromorphone Other (See Comments)   If administered quickly, felt like hand was "exploding"       Medication List    STOP taking these medications   aspirin 81 MG EC tablet   clopidogrel 75 MG tablet Commonly known as: PLAVIX   isosorbide mononitrate 30 MG 24 hr tablet Commonly known as: IMDUR   pravastatin 80 MG tablet Commonly known as: PRAVACHOL     TAKE these medications   albuterol (2.5 MG/3ML) 0.083% nebulizer solution Commonly known as: PROVENTIL Take 2.5 mg by nebulization every 6 (six) hours as needed for wheezing or shortness of breath.   allopurinol 100 MG tablet Commonly known as: ZYLOPRIM Take 100 mg by mouth daily.    ALPRAZolam 0.5 MG tablet Commonly known as: XANAX Take 1 tablet (0.5 mg total) by mouth 3 (three) times daily as needed for anxiety or sleep.   amiodarone 200 MG tablet Commonly known as: PACERONE Take 1 tablet (200 mg total) by mouth 2 (two) times daily.   amLODipine 10 MG tablet Commonly known as: NORVASC Take 1 tablet (10 mg total) by mouth daily. Start taking on: August 13, 2019   Auryxia 1 GM 210 MG(Fe) tablet Generic drug: ferric citrate Take 420 mg by mouth 3 (three) times daily with meals.   budesonide-formoterol 160-4.5 MCG/ACT inhaler Commonly known as: SYMBICORT Inhale 2 puffs into the lungs every 4 (four) hours as needed ("for shortness of breath or wheezing").   dicyclomine 10 MG capsule Commonly known as: BENTYL Take 1 capsule (10 mg total) by mouth at bedtime as needed for spasms. What changed: when to take this   diphenhydrAMINE 25 MG tablet Commonly known as: BENADRYL Take 25 mg by mouth at bedtime as needed for sleep.   docusate sodium 100 MG capsule Commonly known as: COLACE Take 1 capsule (100 mg total) by mouth 2 (two) times daily. What changed:   how much to take  when to take this   feeding supplement (NEPRO CARB STEADY) Liqd Take 237 mLs by mouth 2 (two) times daily between meals.   HYDROcodone-acetaminophen 5-325 MG tablet Commonly known as: NORCO/VICODIN Take 1-2 tablets by  mouth every 6 (six) hours as needed for severe pain.   Ipratropium-Albuterol 20-100 MCG/ACT Aers respimat Commonly known as: COMBIVENT Inhale 2 puffs into the lungs 4 (four) times daily as needed for wheezing or shortness of breath.   lidocaine-prilocaine cream Commonly known as: EMLA Apply 1 application topically daily as needed (pain at the site of port access).   metoprolol tartrate 50 MG tablet Commonly known as: LOPRESSOR Take 1 tablet (50 mg total) by mouth 2 (two) times daily. What changed: Another medication with the same name was removed. Continue taking  this medication, and follow the directions you see here.   nitroGLYCERIN 0.4 MG SL tablet Commonly known as: NITROSTAT Place 0.4 mg under the tongue every 5 (five) minutes as needed for chest pain.   ondansetron 4 MG disintegrating tablet Commonly known as: ZOFRAN-ODT Take 4 mg by mouth every 8 (eight) hours as needed for nausea (DISSOLVE ORALLY).   pantoprazole 40 MG tablet Commonly known as: PROTONIX Take 1 tablet (40 mg total) by mouth 2 (two) times daily. What changed: when to take this   polyethylene glycol 17 g packet Commonly known as: MIRALAX / GLYCOLAX Take 17 g by mouth daily as needed for mild constipation.   sennosides-docusate sodium 8.6-50 MG tablet Commonly known as: SENOKOT-S Take 1 tablet by mouth every 8 (eight) hours as needed for constipation.   Vitamin D3 50 MCG (2000 UT) Tabs Take 4,000 Units by mouth in the morning.       Follow-up Information    Garvin Fila, MD. Schedule an appointment as soon as possible for a visit in 4 week(s).   Specialties: Neurology, Radiology Contact information: [address] [place] [address] [phone]              Allergies  Allergen Reactions  . Betaine Anaphylaxis  . Cyclobenzaprine Anaphylaxis and Other (See Comments)    "stopped heart"   . Morphine Anaphylaxis and Other (See Comments)    "stopped heart"  . Penicillins Shortness Of Breath, Swelling and Palpitations    Did it involve swelling of the face/tongue/throat, SOB, or low BP? Yes Did it involve sudden or severe rash/hives, skin peeling, or any reaction on the inside of your mouth or nose? No Did you need to seek medical attention at a hospital or doctor's office? No When did it last happen?years  If all above answers are "NO", may proceed with cephalosporin use.   . Ambien [Zolpidem] Other (See Comments)    Severe confusion  . Codeine Itching and Rash  . Hydromorphone Other (See Comments)    If administered quickly,  felt like hand was "exploding"     Consultations:  Nephrology, neurology, palliative care   Procedures/Studies: CT ANGIO HEAD W OR WO CONTRAST  Result Date: 08/05/2019 CLINICAL DATA:  Right-sided weakness EXAM: CT ANGIOGRAPHY HEAD AND NECK TECHNIQUE: Multidetector CT imaging of the head and neck was performed using the standard protocol during bolus administration of intravenous contrast. Multiplanar CT image reconstructions and MIPs were obtained to evaluate the vascular anatomy. Carotid stenosis measurements (when applicable) are obtained utilizing NASCET criteria, using the distal internal carotid diameter as the denominator. CONTRAST:  54mL OMNIPAQUE IOHEXOL 350 MG/ML SOLN COMPARISON:  06/19/2019 FINDINGS: CTA NECK Aortic arch: Calcified and irregular noncalcified plaque again identified along the arch. Great vessel origins are patent. Right carotid system: Patent with multifocal calcified and noncalcified plaque. Stable less than 50% stenosis at the ICA origin. Left carotid system: Patent with multifocal  calcified and noncalcified plaque. As before, there is approximately 60% stenosis of the common carotid at the level of the thyroid. No measurable stenosis at the ICA origin with evidence of prior endarterectomy. Vertebral arteries: Patent with calcified plaque again noted at the origins. Skeleton: Advanced multilevel degenerative changes. Other neck: No acute abnormality. Upper chest: Left greater than right pleural effusions with associated atelectasis. Review of the MIP images confirms the above findings CTA HEAD Anterior circulation: Intracranial internal carotid arteries are patent with calcified plaque causing up to moderate stenosis. Middle and anterior cerebral arteries are patent. Unchanged moderate stenosis of the distal left A2 ACA. Posterior circulation: Intracranial vertebral arteries are patent with calcified and noncalcified plaque. There is unchanged moderate stenosis of the right  vertebral artery beyond the PICA origin. Basilar artery is patent with mild atherosclerotic irregularity. Posterior cerebral arteries are patent proximally with atherosclerotic irregularity. There is unchanged diminished opacification of the superior division of the distal left PCA. Venous sinuses: Patent as allowed by contrast bolus timing. Review of the MIP images confirms the above findings IMPRESSION: No substantial change since 06/19/2019. There is irregular noncalcified plaque along the aortic arch. Stable findings of stenosis including left common carotid artery, intracranial right vertebral artery, intracranial ICAs, and left A2 ACA. Persistent pleural effusions and associated atelectasis. Electronically Signed   By: Macy Mis Christina.D.   On: 08/05/2019 13:50   DG Chest 1 View  Result Date: 08/02/2019 CLINICAL DATA:  Central line placement. EXAM: CHEST  1 VIEW COMPARISON:  08/01/2019.  CT 05/22/2019. FINDINGS: Interim placement left subclavian line, its tip is at the cavoatrial junction. Endotracheal tube and NG tube in stable position. Cardiomegaly. No pulmonary venous congestion. Mild bilateral interstitial prominence again noted. A component of these changes may be chronic however interstitial edema and/or pneumonitis can not be excluded. Tiny left pleural effusion. No pneumothorax. Surgical clips noted over the neck. IMPRESSION: 1. Interim placement of left subclavian line, its tip is at the cavoatrial junction. Endotracheal tube and NG tube in stable position. 2. Cardiomegaly. No pulmonary venous congestion. Mild bilateral interstitial prominence again noted. A component of these changes may be chronic however interstitial edema and/or pneumonitis cannot be excluded. Tiny left pleural effusion. Electronically Signed   By: Marcello Moores  Register   On: 08/02/2019 06:10   DG Forearm Right  Result Date: 08/04/2019 CLINICAL DATA:  Fall, right-sided weakness EXAM: RIGHT FOREARM - 2 VIEW COMPARISON:  None.  FINDINGS: No fracture or malalignment. Fat pad distention at the elbow consistent with an effusion. Prominent posterior elbow soft tissue swelling. Probable cartilaginous calcification at the wrist. IMPRESSION: No definitive fracture is seen, however there is an elbow effusion and occult fracture cannot be excluded. Electronically Signed   By: Donavan Foil Christina.D.   On: 08/04/2019 19:33   DG Tibia/Fibula Right  Result Date: 08/04/2019 CLINICAL DATA:  [place] during PT profound right-sided weakness EXAM: RIGHT TIBIA AND FIBULA - 2 VIEW COMPARISON:  None. FINDINGS: No acute displaced fracture or malalignment. Vascular calcifications. Soft tissues are unremarkable. IMPRESSION: No acute osseous abnormality. Electronically Signed   By: Donavan Foil Christina.D.   On: 08/04/2019 19:30   CT HEAD WO CONTRAST  Result Date: 08/11/2019 CLINICAL DATA:  Follow-up stroke. EXAM: CT HEAD WITHOUT CONTRAST TECHNIQUE: Contiguous axial images were obtained from the base of the skull through the vertex without intravenous contrast. COMPARISON:  MRI head 08/10/2019.  CT head 08/09/2019 FINDINGS: Brain: Multiple areas of acute cortical infarction are seen on MRI diffusion-weighted imaging  and appear hypodense on CT. Small acute infarct right cerebellum on MRI. Small interhemispheric subdural hematoma has improved. No new area of hemorrhage. Generalized atrophy. Extensive chronic microvascular ischemic change in the white matter. Chronic infarcts in the cerebellum bilaterally. Vascular: Negative for hyperdense vessel Skull: Negative Sinuses/Orbits: Paranasal sinuses clear.  Negative orbit. Other: None IMPRESSION: Multiple areas of acute cortical infarction in both cerebral hemispheres are best seen on diffusion-weighted imaging. Improving small interhemispheric subdural hematoma. Atrophy and extensive chronic ischemic changes. Electronically Signed   By: Franchot Gallo Christina.D.   On: 08/11/2019 13:13   CT HEAD WO CONTRAST  Result Date:  08/09/2019 CLINICAL DATA:  Asystolic arrest 11/91/4782, hyperkalemia, metabolic encephalopathy, history of subdural hematoma EXAM: CT HEAD WITHOUT CONTRAST TECHNIQUE: Contiguous axial images were obtained from the base of the skull through the vertex without intravenous contrast. COMPARISON:  08/05/2019, 08/04/2019 FINDINGS: Brain: The trace falcine subdural hematoma is again identified with decreased prominence since prior study. No new areas of hemorrhage. Scattered hypodensities are seen within the bilateral cortex, bilateral basal ganglia, and bilateral cerebellar hemispheres, corresponding to the multifocal small infarcts seen on recent MRI. The lateral ventricles and midline structures are stable. No mass effect. Vascular: No hyperdense vessel or unexpected calcification. Skull: Normal. Negative for fracture or focal lesion. Sinuses/Orbits: No acute finding. Other: None. IMPRESSION: 1. Stable small falcine subdural hematoma, decreased in prominence since prior study. No mass effect. 2. 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 No change since recent MRI. 3. No evidence of parenchymal hemorrhage. Electronically Signed   By: Randa Ngo Christina.D.   On: 08/09/2019 17:35   CT HEAD WO CONTRAST  Addendum Date: 08/05/2019   ADDENDUM REPORT: 08/05/2019 11:34 ADDENDUM: Stable trace falcine subdural hematoma Electronically Signed   By: Macy Mis Christina.D.   On: 08/05/2019 11:34   Result Date: 08/05/2019 CLINICAL DATA:  New right-sided weakness EXAM: CT HEAD WITHOUT CONTRAST TECHNIQUE: Contiguous axial images were obtained from the base of the skull through the vertex without intravenous contrast. COMPARISON:  08/02/2019 FINDINGS: Brain: There is no acute intracranial hemorrhage, mass effect, or edema. There is no definite new loss of gray-white differentiation. As before, there are multifocal chronic infarcts involving  bilateral cerebral hemispheres including deep gray nuclei and bilateral cerebellar hemispheres. Additional patchy and confluent hypoattenuation in the supratentorial white matter likely reflects advanced chronic microvascular ischemic change. Prominence of the ventricles and sulci reflects stable parenchymal volume loss. Vascular: There is atherosclerotic calcification at the skull base. Skull: Calvarium is unremarkable. Sinuses/Orbits: No acute finding. Other: None. IMPRESSION: No acute intracranial hemorrhage or evidence of acute infarction. Small new infarct difficult to exclude due to extensive chronic ischemic changes. Electronically Signed: By: Macy Mis Christina.D. On: 08/04/2019 19:39   CT HEAD WO CONTRAST  Result Date: 08/02/2019 CLINICAL DATA:  Follow-up subdural hematoma EXAM: CT HEAD WITHOUT CONTRAST TECHNIQUE: Contiguous axial images were obtained from the base of the skull through the vertex without intravenous contrast. COMPARISON:  Yesterday FINDINGS: Brain: Trace high-density thickening left parafalcine at the vertex, not progressed. This is convincing for subdural hemorrhage when compared with a 06/17/2019 head CT. Extensive chronic small vessel ischemic injury with confluent gliosis in the cerebral white matter. Small remote bilateral cerebellar infarcts. Patchy remote cortical infarcts along the cerebral convexities. Chronic lacune at the right caudate head. No hydrocephalus or masslike finding Vascular: No hyperdense vessel or unexpected calcification. Skull: Normal. Negative for fracture or focal lesion.  Sinuses/Orbits: No acute finding. IMPRESSION: 1. Stable trace parafalcine subdural hematoma. 2. Advanced chronic ischemic injury. Electronically Signed   By: Monte Fantasia Christina.D.   On: 08/02/2019 06:14   CT HEAD WO CONTRAST  Addendum Date: 08/01/2019   ADDENDUM REPORT: 08/01/2019 13:43 ADDENDUM: Study discussed by telephone with Pulmonary/Critical Care Dr. Valeta Harms on 08/01/2019 at 1339 hours.  Electronically Signed   By: Genevie Ann Christina.D.   On: 08/01/2019 13:43   Result Date: 08/01/2019 CLINICAL DATA:  69 year old female status post cardiac arrest. Dialysis patient. EXAM: CT HEAD WITHOUT CONTRAST TECHNIQUE: Contiguous axial images were obtained from the base of the skull through the vertex without intravenous contrast. COMPARISON:  CTA head and neck 06/19/2019. Head CT 06/17/2019 and earlier. FINDINGS: Brain: Chronic encephalomalacia in the left frontal lobe, left parietal lobe, right greater than left occipital lobes. Superimposed confluent bilateral white matter hypodensity. Chronic hypodensity scattered in the bilateral deep gray matter nuclei. Small chronic cerebellar infarcts again noted. No evidence of acute cerebral edema. No intracranial mass effect. However, there does appear to be a small or trace volume of subdural hematoma along the falx (coronal image 35). But no other acute intracranial hemorrhage identified. Vascular: Calcified atherosclerosis at the skull base. Skull: No acute osseous abnormality identified. Sinuses/Orbits: Visualized paranasal sinuses and mastoids are stable and well pneumatized. Other: Small volume of fluid in the visible pharynx with oral endotracheal and enteric tubes visible. No acute orbit or scalp soft tissue finding. IMPRESSION: 1. Trace acute subdural hematoma along the falx. But no other acute intracranial hemorrhage and no intracranial mass effect. 2. No CT evidence of anoxic injury. Advanced chronic ischemic disease appears stable from the CTA on 06/19/2019. Electronically Signed: By: Genevie Ann Christina.D. On: 08/01/2019 13:28   CT ANGIO NECK W OR WO CONTRAST  Result Date: 08/05/2019 CLINICAL DATA:  Right-sided weakness EXAM: CT ANGIOGRAPHY HEAD AND NECK TECHNIQUE: Multidetector CT imaging of the head and neck was performed using the standard protocol during bolus administration of intravenous contrast. Multiplanar CT image reconstructions and MIPs were obtained to  evaluate the vascular anatomy. Carotid stenosis measurements (when applicable) are obtained utilizing NASCET criteria, using the distal internal carotid diameter as the denominator. CONTRAST:  9mL OMNIPAQUE IOHEXOL 350 MG/ML SOLN COMPARISON:  06/19/2019 FINDINGS: CTA NECK Aortic arch: Calcified and irregular noncalcified plaque again identified along the arch. Great vessel origins are patent. Right carotid system: Patent with multifocal calcified and noncalcified plaque. Stable less than 50% stenosis at the ICA origin. Left carotid system: Patent with multifocal calcified and noncalcified plaque. As before, there is approximately 60% stenosis of the common carotid at the level of the thyroid. No measurable stenosis at the ICA origin with evidence of prior endarterectomy. Vertebral arteries: Patent with calcified plaque again noted at the origins. Skeleton: Advanced multilevel degenerative changes. Other neck: No acute abnormality. Upper chest: Left greater than right pleural effusions with associated atelectasis. Review of the MIP images confirms the above findings CTA HEAD Anterior circulation: Intracranial internal carotid arteries are patent with calcified plaque causing up to moderate stenosis. Middle and anterior cerebral arteries are patent. Unchanged moderate stenosis of the distal left A2 ACA. Posterior circulation: Intracranial vertebral arteries are patent with calcified and noncalcified plaque. There is unchanged moderate stenosis of the right vertebral artery beyond the PICA origin. Basilar artery is patent with mild atherosclerotic irregularity. Posterior cerebral arteries are patent proximally with atherosclerotic irregularity. There is unchanged diminished opacification of the superior division of the distal left PCA. Venous  sinuses: Patent as allowed by contrast bolus timing. Review of the MIP images confirms the above findings IMPRESSION: No substantial change since 06/19/2019. There is irregular  noncalcified plaque along the aortic arch. Stable findings of stenosis including left common carotid artery, intracranial right vertebral artery, intracranial ICAs, and left A2 ACA. Persistent pleural effusions and associated atelectasis. Electronically Signed   By: Macy Mis Christina.D.   On: 08/05/2019 13:50   MR BRAIN WO CONTRAST  Result Date: 08/10/2019 CLINICAL DATA:  Stroke follow-up EXAM: MRI HEAD WITHOUT CONTRAST TECHNIQUE: Multiplanar, multiecho pulse sequences of the brain and surrounding structures were obtained without intravenous contrast. COMPARISON:  MRI of the brain August 05, 2019 FINDINGS: The study is partially degraded by motion. Brain: Multiple foci of restricted diffusion are seen within the bilateral of cerebral and cerebellar hemispheres, right thalamus and right caudate, less evident than on prior MRI, likely due to pseudo normalization in some of the lesions. The lesions in the bilateral cerebral hemisphere appear to be predominantly in watershed distribution. Some of the lesions show susceptibility artifact os 1, consistent with microhemorrhages. Extensive confluent T2 hyperintensity of the white matter of the cerebral hemispheres and within the pons, nonspecific, most likely related to chronic small vessel ischemia. 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 Vascular: Normal flow voids. Skull and upper cervical spine: Advanced degenerative changes with spinal canal stenosis at C3-4, partially evaluated. Sinuses/Orbits: Mucosal thickening of the ethmoid cells and frontal sinuses the orbits are maintained. Other: Bilateral mastoid effusion. IMPRESSION: 1. Multiple foci of restricted diffusion within the bilateral cerebral and cerebellar hemispheres, right thalamus and right caudate, less evident than on prior MRI, likely due to pseudo normalization in some of the lesions. The lesions in the bilateral cerebral hemisphere appear to be predominantly in  watershed distribution. 2. Extensive confluent T2 hyperintensity of the white matter of the cerebral hemispheres and pons, nonspecific, most likely related to chronic small vessel ischemia. 3. 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 4. Bilateral mastoid effusion. Electronically Signed   By: Pedro Earls Christina.D.   On: 08/10/2019 11:17   MR BRAIN WO CONTRAST  Result Date: 08/05/2019 CLINICAL DATA:  Right-sided weakness EXAM: MRI HEAD WITHOUT CONTRAST TECHNIQUE: Multiplanar, multiecho pulse sequences of the brain and surrounding structures were obtained without intravenous contrast. COMPARISON:  06/17/2019, correlation made with more recent CT imaging FINDINGS: Motion artifact is present. Brain: There are multiple small foci reduced diffusion involving bilateral cerebral hemispheres, bilateral cerebellum and bilateral central gray nuclei. This is superimposed on multifocal chronic infarcts. There is trace subdural hemorrhage along the falx as seen on prior CT. Foci of susceptibility right frontal and parietal lobes compatible with chronic microhemorrhage or less likely mineralization. Confluent areas of T2 hyperintensity in the supratentorial white matter are nonspecific but probably reflect advanced chronic microvascular ischemic changes. There is no intracranial mass or mass effect. Vascular: Major vessel flow voids at the skull base are preserved. Skull and upper cervical spine: Normal marrow signal is preserved. Sinuses/Orbits: Paranasal sinuses are aerated. Orbits are unremarkable. Other: Sella is unremarkable.  Mastoid air cells are clear. IMPRESSION: Numerous small acute infarcts involving multiple vascular territories bilaterally suggesting a central embolic etiology. Advanced chronic microvascular ischemic changes and multiple chronic infarcts. These results will be called to the ordering clinician or representative by the Radiologist Assistant, and  communication documented in the PACS or [employer]. Electronically Signed   By: Macy Mis Christina.D.   On:  08/05/2019 11:47   IR IVC FILTER PLMT / S&I Burke Keels GUID/MOD SED  Result Date: 08/06/2019 CLINICAL DATA:  Right lower extremity DVT. Intracranial hemorrhage, a relative contraindication to anticoagulation. Caval filtration requested. EXAM: INFERIOR VENACAVOGRAM IVC FILTER PLACEMENT UNDER FLUOROSCOPY FLUOROSCOPY TIME:  72 seconds; 17 mGy TECHNIQUE: Patency of the right IJ vein was confirmed with ultrasound with image documentation. An appropriate skin site was determined. Skin site was marked, prepped with chlorhexidine, and draped using maximum barrier technique. The region was infiltrated locally with 1% lidocaine. Intravenous Fentanyl 12.42mcg were administered as conscious sedation during continuous monitoring of the patient's level of consciousness and physiological / cardiorespiratory status by the radiology RN, with a total moderate sedation time of 13 minutes. Under real-time ultrasound guidance, the right IJ vein was accessed with a 21 gauge micropuncture needle; the needle tip within the vein was confirmed with ultrasound image documentation. The needle was exchanged over a 018 guidewire for a transitional dilator, which allow advancement of the [facility] wire into the IVC. A long 6 French vascular sheath was placed for inferior venacavography. This demonstrated no caval thrombus. Renal vein inflows were evident. The [facility] IVC filter was advanced through the sheath and successfully deployed under fluoroscopy at the L3 level. Followup cavagram demonstrates stable filter position and no evident complication. The sheath was removed and hemostasis achieved at the site. No immediate complication. IMPRESSION: 1. Normal IVC. No thrombus or significant anatomic variation. 2. Technically successful infrarenal IVC filter placement. This is a retrievable model. PLAN: This IVC filter is potentially retrievable.  The patient will be assessed for filter retrieval by Interventional Radiology in approximately 8-12 weeks. Further recommendations regarding filter retrieval, continued surveillance or declaration of device permanence, will be made at that time. Electronically Signed   By: Lucrezia Europe Christina.D.   On: 08/06/2019 08:23   IR Removal Tun Cv Cath W/O FL  Result Date: 07/14/2019 INDICATION: End-stage renal disease on hemodialysis. Now with functional arteriovenous fistula. Request for removal hemodialysis catheter which was placed by vascular surgery. EXAM: REMOVAL OF TUNNELED HEMODIALYSIS CATHETER MEDICATIONS: 1% lidocaine 3 mL COMPLICATIONS: None immediate. PROCEDURE: Informed written consent was obtained from the patient following an explanation of the procedure, risks, benefits and alternatives to treatment. A time out was performed prior to the initiation of the procedure. Maximal barrier sterile technique was utilized including caps, mask, sterile gowns, sterile gloves, large sterile drape, hand hygiene, and ChloraPrep. 1% lidocaine with epinephrine was injected under sterile conditions along the subcutaneous tunnel. Utilizing a combination of blunt dissection and gentle traction, the catheter was removed intact. Hemostasis was obtained with manual compression. A dressing was placed. The patient tolerated the procedure well without immediate post procedural complication. IMPRESSION: Successful removal of tunneled dialysis catheter. Read by: Gareth Eagle, PA-C Electronically Signed   By: Lucrezia Europe Christina.D.   On: 07/14/2019 11:51   DG CHEST PORT 1 VIEW  Result Date: 08/08/2019 CLINICAL DATA:  69 year old female with chest pain. EXAM: PORTABLE CHEST 1 VIEW COMPARISON:  Chest radiograph dated 08/06/2019. FINDINGS: Left subclavian central venous line with tip close to the cavoatrial junction similar to prior radiograph. Small bilateral pleural effusions, right greater than left with bibasilar atelectasis or infiltrate.  Slight improvement in aeration of the lungs and edema compared to prior radiograph. No pneumothorax. Stable cardiomegaly. Coronary vascular calcification and stent. Atherosclerotic calcification of the aorta. No acute osseous pathology. Osteopenia. IMPRESSION: Small bilateral pleural effusions with bibasilar atelectasis or infiltrate. Slightly improved aeration of the lungs. Electronically  Signed   By: Anner Crete Christina.D.   On: 08/08/2019 02:33   DG CHEST PORT 1 VIEW  Result Date: 08/06/2019 CLINICAL DATA:  Follow-up pleural effusion EXAM: PORTABLE CHEST 1 VIEW COMPARISON:  Four days ago FINDINGS: Extubation of the trachea and esophagus. Left subclavian line with tip in good position. Interstitial opacity and probable layering pleural fluid which is increased. There is also hazy airspace disease. No pneumothorax. Cardiomegaly and vascular pedicle widening. IMPRESSION: Edema and pleural effusions which have progressed from 4 days ago. Electronically Signed   By: Monte Fantasia Christina.D.   On: 08/06/2019 08:56   DG CHEST PORT 1 VIEW  Result Date: 08/02/2019 CLINICAL DATA:  Endotracheal tube placement. EXAM: PORTABLE CHEST 1 VIEW COMPARISON:  Earlier this day. FINDINGS: Endotracheal tube tip 4.8 cm from the carina. Tip and side port of the enteric tube below the diaphragm in the stomach. Left subclavian central line unchanged in the SVC. Stable cardiomegaly. Unchanged mediastinal contours with aortic atherosclerosis. Interstitial thickening suspicious for pulmonary edema, similar. Minimal retrocardiac opacity and blunting of the costophrenic angle. There is no pneumothorax. IMPRESSION: 1. Endotracheal tube tip 4.8 cm from the carina. Enteric tube in place. 2. Unchanged cardiomegaly and interstitial opacities suspicious for pulmonary edema. Possible small left pleural effusion. Electronically Signed   By: Keith Rake Christina.D.   On: 08/02/2019 18:59   DG Chest Port 1 View  Result Date: 08/01/2019 CLINICAL DATA:   Intubation.  Assess for pneumothorax. EXAM: PORTABLE CHEST 1 VIEW COMPARISON:  August 01, 2019 7:58 a.Christina. FINDINGS: Endotracheal tube is identified 5.2 cm from carina. There is no pleural line to suggest pneumothorax. Nasogastric tube is identified distal tip not included on film but is at least in the stomach. Mild diffuse increased interstitium is identified bilaterally. Patchy opacities of both lung bases are noted. The heart size is mildly enlarged. The bony structures are stable. IMPRESSION: 1. Endotracheal tube 5.2 cm from carina. No pneumothorax. 2. Mild pulmonary edema. 3. Patchy opacities of both lung bases, superimposed pneumonia is not excluded. Electronically Signed   By: Abelardo Diesel Christina.D.   On: 08/01/2019 13:05   DG Humerus Right  Result Date: 08/04/2019 CLINICAL DATA:  Right-sided weakness history of fall EXAM: RIGHT HUMERUS - 2+ VIEW COMPARISON:  None. FINDINGS: There is no evidence of fracture or other focal bone lesions. Soft tissues are unremarkable. IMPRESSION: Negative. Electronically Signed   By: Donavan Foil Christina.D.   On: 08/04/2019 19:31   EEG adult  Result Date: 08/09/2019 Lora Havens, MD     08/09/2019  6:01 PM Patient Name: SIRIAH TREAT MRN: [id number] Epilepsy Attending: Lora Havens Referring Physician/Provider: Dr Lala Lund Date: 08/09/2019 Duration: 24.7 mins Patient history: 69 yo F with SDH and multifocal acute infarcts. EEG to evaluate for seizure. Level of alertness: lethargic AEDs during EEG study: Gabapentin Technical aspects: This EEG study was done with scalp electrodes positioned according to the 10-20 International system of electrode placement. Electrical activity was acquired at a sampling rate of 500Hz  and reviewed with a high frequency filter of 70Hz  and a low frequency filter of 1Hz . EEG data were recorded continuously and digitally stored. Description: EEG showed continuous generalized 3 to 6 Hz theta-delta slowing. Hyperventilation and photic  stimulation were not performed.   ABNORMALITY -Continuous slow, generalized IMPRESSION: This study is suggestive of moderate diffuse encephalopathy, nonspecific etiology. No seizures or epileptiform discharges were seen throughout the recording. Priyanka [name]   ECHOCARDIOGRAM COMPLETE  Result Date:  08/01/2019    ECHOCARDIOGRAM REPORT   Patient Name:   LADA FULBRIGHT Date of Exam: 08/01/2019 Medical Rec #:  [id number]          Height:       62.0 in Accession #:    [id number]         Weight:       89.3 lb Date of Birth:  1950/06/18           BSA:          1.357 Christina Patient Age:    31 years           BP:           153/69 mmHg Patient Gender: F                  HR:           49 bpm. Exam Location:  Inpatient Procedure: 2D Echo, Cardiac Doppler and Color Doppler Indications:    Cardiac arrest ([place]) [427.5.ICD-9-CM]  History:        Patient has prior history of Echocardiogram examinations, most                 recent 06/20/2019. CAD, Stroke and COPD, Arrythmias:Atrial                 Fibrillation, Signs/Symptoms:Chest Pain; Risk                 Factors:Hypertension, Dyslipidemia and Former Smoker.  Sonographer:    Vickie Epley RDCS Referring Phys: [id number] GRACE E BOWSER  Sonographer Comments: Echo performed with patient supine and on artificial respirator. IMPRESSIONS  1. Left ventricular ejection fraction, by estimation, is 55 to 60%. The left ventricle has normal function. The left ventricle has no regional wall motion abnormalities. There is the interventricular septum is mildly flattened in systole and diastole, consistent with right ventricular pressure and volume overload.  2. Right ventricular systolic function is mildly reduced. The right ventricular size is normal. There is severely elevated pulmonary artery systolic pressure. The estimated right ventricular systolic pressure is 80.9 mmHg.  3. Left atrial size was mildly dilated.  4. The mitral valve is abnormal. Mild mitral valve regurgitation.  5. Tricuspid  valve regurgitation is mild to moderate.  6. The aortic valve is tricuspid. Aortic valve regurgitation is trivial. Mild aortic valve sclerosis is present, with no evidence of aortic valve stenosis.  7. The inferior vena cava is normal in size with <50% respiratory variability, suggesting right atrial pressure of 8 mmHg. FINDINGS  Left Ventricle: Left ventricular ejection fraction, by estimation, is 55 to 60%. The left ventricle has normal function. The left ventricle has no regional wall motion abnormalities. The left ventricular internal cavity size was normal in size. There is  borderline left ventricular hypertrophy. The interventricular septum is flattened in systole and diastole, consistent with right ventricular pressure and volume overload. Left ventricular diastolic parameters are consistent with Grade I diastolic dysfunction (impaired relaxation). Right Ventricle: The right ventricular size is normal. No increase in right ventricular wall thickness. Right ventricular systolic function is mildly reduced. There is severely elevated pulmonary artery systolic pressure. The tricuspid regurgitant velocity is 3.67 Christina/s, and with an assumed right atrial pressure of 8 mmHg, the estimated right ventricular systolic pressure is 98.3 mmHg. Left Atrium: Left atrial size was mildly dilated. Right Atrium: Right atrial size was normal in size. Pericardium: There is no evidence of pericardial effusion. Mitral Valve: The mitral valve is  abnormal. There is mild thickening of the mitral valve leaflet(s). There is mild calcification of the mitral valve leaflet(s). Mild to moderate mitral annular calcification. Mild mitral valve regurgitation. Tricuspid Valve: The tricuspid valve is grossly normal. Tricuspid valve regurgitation is mild to moderate. Aortic Valve: The aortic valve is tricuspid. Aortic valve regurgitation is trivial. Mild aortic valve sclerosis is present, with no evidence of aortic valve stenosis. Mild aortic valve  annular calcification. Pulmonic Valve: The pulmonic valve was grossly normal. Pulmonic valve regurgitation is trivial. Aorta: The aortic root is normal in size and structure. Venous: The inferior vena cava is normal in size with less than 50% respiratory variability, suggesting right atrial pressure of 8 mmHg. IAS/Shunts: No atrial level shunt detected by color flow Doppler.  LEFT VENTRICLE PLAX 2D LVIDd:         4.20 cm     Diastology LVIDs:         2.70 cm     LV e' lateral:   4.95 cm/s LV PW:         1.00 cm     LV E/e' lateral: 16.0 LV IVS:        1.00 cm     LV e' medial:    3.09 cm/s LVOT diam:     1.50 cm     LV E/e' medial:  25.6 LV SV:         40 LV SV Index:   29 LVOT Area:     1.77 cm  LV Volumes (MOD) LV vol d, MOD A2C: 74.2 ml LV vol d, MOD A4C: 99.4 ml LV vol s, MOD A2C: 38.1 ml LV vol s, MOD A4C: 50.3 ml LV SV MOD A2C:     36.1 ml LV SV MOD A4C:     99.4 ml LV SV MOD BP:      43.9 ml RIGHT VENTRICLE RV S prime:     6.58 cm/s TAPSE (Christina-mode): 1.1 cm LEFT ATRIUM             Index       RIGHT ATRIUM           Index LA diam:        4.70 cm 3.46 cm/Christina  RA Area:     15.30 cm LA Vol (A2C):   47.7 ml 35.15 ml/Christina RA Volume:   39.10 ml  28.81 ml/Christina LA Vol (A4C):   50.5 ml 37.22 ml/Christina LA Biplane Vol: 49.4 ml 36.41 ml/Christina  AORTIC VALVE LVOT Vmax:   110.00 cm/s LVOT Vmean:  68.000 cm/s LVOT VTI:    0.224 Christina  AORTA Ao Root diam: 2.90 cm MITRAL VALVE               TRICUSPID VALVE MV Area (PHT): 2.26 cm    TR Peak grad:   53.9 mmHg MV Decel Time: 335 msec    TR Vmax:        367.00 cm/s MV E velocity: 79.20 cm/s MV A velocity: 93.20 cm/s  SHUNTS MV E/A ratio:  0.85        Systemic VTI:  0.22 Christina                            Systemic Diam: 1.50 cm Rozann Lesches MD Electronically signed by Rozann Lesches MD Signature Date/Time: 08/01/2019/5:36:32 PM    Final    DG FEMUR, MIN 2 VIEWS RIGHT  Result Date: 08/04/2019 CLINICAL DATA:  Fall,  right-sided weakness EXAM: RIGHT FEMUR 2 VIEWS COMPARISON:  None. FINDINGS: No  fracture or malalignment. Vascular calcifications. Edema within the soft tissues. IMPRESSION: No acute osseous abnormality. Electronically Signed   By: Donavan Foil Christina.D.   On: 08/04/2019 19:34   VAS [place] LOWER EXTREMITY VENOUS (DVT)  Result Date: 08/05/2019  Lower Venous DVTStudy Indications: Swelling.  Comparison Study: no prior Performing Technologist: Abram Sander RVS  Examination Guidelines: A complete evaluation includes B-mode imaging, spectral Doppler, color Doppler, and power Doppler as needed of all accessible portions of each vessel. Bilateral testing is considered an integral part of a complete examination. Limited examinations for reoccurring indications may be performed as noted. The reflux portion of the exam is performed with the patient in reverse Trendelenburg.  +---------+---------------+---------+-----------+----------+-----------------+ RIGHT    CompressibilityPhasicitySpontaneityPropertiesThrombus Aging    +---------+---------------+---------+-----------+----------+-----------------+ CFV      Partial        Yes      Yes                  Age Indeterminate +---------+---------------+---------+-----------+----------+-----------------+ SFJ      None                                         Age Indeterminate +---------+---------------+---------+-----------+----------+-----------------+ FV Prox  None                                         Age Indeterminate +---------+---------------+---------+-----------+----------+-----------------+ FV Mid   None                                         Age Indeterminate +---------+---------------+---------+-----------+----------+-----------------+ FV DistalNone                                         Age Indeterminate +---------+---------------+---------+-----------+----------+-----------------+ PFV      None                                         Age Indeterminate  +---------+---------------+---------+-----------+----------+-----------------+ POP      None           No       No                   Age Indeterminate +---------+---------------+---------+-----------+----------+-----------------+ PTV      None                                         Age Indeterminate +---------+---------------+---------+-----------+----------+-----------------+ PERO     None                                         Age Indeterminate +---------+---------------+---------+-----------+----------+-----------------+ GSV      None                                                           +---------+---------------+---------+-----------+----------+-----------------+  EIV                     Yes      Yes                                    +---------+---------------+---------+-----------+----------+-----------------+   +----+---------------+---------+-----------+----------+--------------+ LEFTCompressibilityPhasicitySpontaneityPropertiesThrombus Aging +----+---------------+---------+-----------+----------+--------------+ CFV Full           Yes      Yes                                 +----+---------------+---------+-----------+----------+--------------+     Summary: RIGHT: - Findings consistent with age indeterminate deep vein thrombosis involving the right common femoral vein, SF junction, right femoral vein, right proximal profunda vein, right popliteal vein, and right posterior tibial veins. - Findings consistent with age indeterminate superficial vein thrombosis involving the right great saphenous vein.   *See table(s) above for measurements and observations. Electronically signed by Harold Barban MD on 08/05/2019 at 5:18:17 PM.    Final    VAS [place] UPPER EXTREMITY VENOUS DUPLEX  Result Date: 08/05/2019 UPPER VENOUS STUDY  Indications: Swelling Comparison Study: no prior Performing Technologist: Abram Sander RVS  Examination Guidelines: A complete evaluation  includes B-mode imaging, spectral Doppler, color Doppler, and power Doppler as needed of all accessible portions of each vessel. Bilateral testing is considered an integral part of a complete examination. Limited examinations for reoccurring indications may be performed as noted.  Right Findings: +----------+------------+---------+-----------+----------+-------+ RIGHT     CompressiblePhasicitySpontaneousPropertiesSummary +----------+------------+---------+-----------+----------+-------+ IJV           Full       Yes       Yes                      +----------+------------+---------+-----------+----------+-------+ Subclavian    Full       Yes       Yes                      +----------+------------+---------+-----------+----------+-------+ Axillary      Full       Yes       Yes                      +----------+------------+---------+-----------+----------+-------+ Brachial      Full       Yes       Yes                      +----------+------------+---------+-----------+----------+-------+ Radial        Full                                          +----------+------------+---------+-----------+----------+-------+ Ulnar         Full                                          +----------+------------+---------+-----------+----------+-------+ Cephalic      Full                                          +----------+------------+---------+-----------+----------+-------+  Basilic       Full                                          +----------+------------+---------+-----------+----------+-------+  Summary:  Right: No evidence of deep vein thrombosis in the upper extremity. No evidence of superficial vein thrombosis in the upper extremity.  *See table(s) above for measurements and observations.  Diagnosing physician: Harold Barban MD Electronically signed by Harold Barban MD on 08/05/2019 at 5:17:35 PM.    Final        Subjective:  Patient seen and examined at  the bedside this morning.  Hemodynamically stable for discharge to home with hospice services. Discharge Exam: Vitals:   08/12/19 0540 08/12/19 0910  BP:    Pulse:    Resp: 20   Temp: 97.8 F (36.6 C)   SpO2:  98%   Vitals:   08/11/19 2006 08/11/19 2038 08/12/19 0540 08/12/19 0910  BP:  (!) 148/51    Pulse:  66    Resp:  20 20   Temp:  (!) 97.5 F (36.4 C) 97.8 F (36.6 C)   TempSrc:  Oral Oral   SpO2: 98% 99%  98%  Weight:      Height:        General: Pt is alert, awake, not in acute distress, weak, lying in bed Cardiovascular: RRR, S1/S2 +, no rubs, no gallops Respiratory: Diminished air sounds bilaterally Abdominal: Soft, NT, ND, bowel sounds + Extremities: Edema in the extremities, no cyanosis    The results of significant diagnostics from this hospitalization (including imaging, microbiology, ancillary and laboratory) are listed below for reference.     Microbiology: No results found for this or any previous visit (from the past 240 hour(s)).   Labs: BNP (last 3 results) Recent Labs    08/01/19 1208 08/07/19 0333 08/08/19 0542  BNP >4,500.0* 3,410.4* 9,163.8*   Basic Metabolic Panel: Recent Labs  Lab 08/07/19 0333 08/07/19 0333 08/08/19 0541 08/09/19 0500 08/10/19 0500 08/10/19 1030 08/11/19 0719  NA 136   < > 133* 135 133* 135 135  K 4.4   < > 4.6 4.8 3.8 4.1 4.3  CL 98   < > 97* 96* 96* 98 97*  CO2 27   < > 24 24 27 27 26   GLUCOSE 138*   < > 175* 85 86 186* 93  BUN 37*   < > 54* 68* 37* 39* 52*  CREATININE 4.01*   < > 5.29* 5.96* 3.68* 4.06* 5.06*  CALCIUM 7.4*   < > 8.1* 8.5* 8.1* 8.3* 8.7*  MG 1.6*  --  2.3  --   --   --   --   PHOS  --   --   --   --   --  5.1* 6.2*   < > = values in this interval not displayed.   Liver Function Tests: Recent Labs  Lab 08/06/19 0540 08/10/19 1030 08/11/19 0719  AST 35  --   --   ALT 145*  --   --   ALKPHOS 75  --   --   BILITOT 1.1  --   --   PROT 4.5*  --   --   ALBUMIN 2.0* 1.6* 1.8*   No  results for input(s): LIPASE, AMYLASE in the last 168 hours. No results for input(s): AMMONIA in the last 168 hours. CBC: Recent Labs  Lab 08/06/19 0540 08/07/19 0333 08/08/19 0541 08/09/19 0500 08/10/19 0500  WBC 10.3 7.9 7.6 8.5 8.2  NEUTROABS  --  6.2 6.1 6.9 6.3  HGB 7.3* 8.8* 9.6* 9.6* 8.3*  HCT 25.1* 28.7* 30.8* 31.3* 28.1*  MCV 98.8 92.9 94.2 92.9 95.9  PLT 133* 94* 111* 140* 156   Cardiac Enzymes: No results for input(s): CKTOTAL, CKMB, CKMBINDEX, TROPONINI in the last 168 hours. BNP: Invalid input(s): POCBNP CBG: Recent Labs  Lab 08/07/19 0412 08/07/19 0829 08/07/19 1231 08/07/19 1612 08/09/19 1714  GLUCAP 120* 84 92 178* 102*   D-Dimer No results for input(s): DDIMER in the last 72 hours. Hgb A1c No results for input(s): HGBA1C in the last 72 hours. Lipid Profile No results for input(s): CHOL, HDL, LDLCALC, TRIG, CHOLHDL, LDLDIRECT in the last 72 hours. Thyroid function studies No results for input(s): TSH, T4TOTAL, T3FREE, THYROIDAB in the last 72 hours.  Invalid input(s): FREET3 Anemia work up No results for input(s): VITAMINB12, FOLATE, FERRITIN, TIBC, IRON, RETICCTPCT in the last 72 hours. Urinalysis    Component Value Date/Time   COLORURINE YELLOW 04/23/2019 1440   APPEARANCEUR CLEAR 04/23/2019 1440   LABSPEC 1.029 04/23/2019 1440   PHURINE 8.0 04/23/2019 1440   GLUCOSEU NEGATIVE 04/23/2019 1440   HGBUR NEGATIVE 04/23/2019 1440   BILIRUBINUR NEGATIVE 04/23/2019 1440   KETONESUR NEGATIVE 04/23/2019 1440   PROTEINUR >=300 (A) 04/23/2019 1440   NITRITE NEGATIVE 04/23/2019 1440   LEUKOCYTESUR NEGATIVE 04/23/2019 1440   Sepsis Labs Invalid input(s): PROCALCITONIN,  WBC,  LACTICIDVEN Microbiology No results found for this or any previous visit (from the past 240 hour(s)).  Please note: You were cared for by a hospitalist during your hospital stay. Once you are discharged, your primary care physician will handle any further medical issues.  Please note that NO REFILLS for any discharge medications will be authorized once you are discharged, as it is imperative that you return to your primary care physician (or establish a relationship with a primary care physician if you do not have one) for your post hospital discharge needs so that they can reassess your need for medications and monitor your lab values.    Time coordinating discharge: 40 minutes  SIGNED:   Shelly Coss, MD  [facility] 08/12/2019, 11:16 AM Pager [phone]  If 7PM-7AM, please contact night-coverage [email or web address] Password [email or web address]

## 2019-08-12 NOTE — Plan of Care (Signed)
Pt d/c to home with hospice services, d/c education complete, printed material sent with PTAR, family made aware.

## 2019-08-18 ENCOUNTER — Telehealth: Payer: Self-pay

## 2019-08-18 ENCOUNTER — Inpatient Hospital Stay: Payer: Self-pay | Admitting: Adult Health

## 2019-08-18 NOTE — Telephone Encounter (Signed)
The sister of ARELENE MORONI is a 69 y.o. female was contacted and we were notified that the patient passed at 8:30pm on 08/17/19. PCP and primary Neurologist have been notified

## 2019-08-19 LAB — PROINSULIN/INSULIN RATIO
Insulin: 13 u[IU]/mL
Proinsulin/Insulin Ratio: 45 %
Proinsulin: 39 pmol/L

## 2019-08-23 ENCOUNTER — Encounter (HOSPITAL_COMMUNITY): Payer: Medicare Other

## 2019-08-23 ENCOUNTER — Ambulatory Visit: Payer: Medicare Other

## 2019-08-25 ENCOUNTER — Encounter (HOSPITAL_COMMUNITY): Payer: Medicare Other

## 2019-08-28 DEATH — deceased

## 2019-09-13 ENCOUNTER — Other Ambulatory Visit (HOSPITAL_COMMUNITY): Payer: Self-pay | Admitting: Interventional Radiology

## 2019-09-13 ENCOUNTER — Encounter (HOSPITAL_COMMUNITY): Payer: Medicare Other

## 2019-09-13 ENCOUNTER — Ambulatory Visit: Payer: Medicare Other | Admitting: Vascular Surgery

## 2019-09-14 ENCOUNTER — Other Ambulatory Visit (HOSPITAL_COMMUNITY): Payer: Self-pay | Admitting: Interventional Radiology

## 2020-08-08 IMAGING — US US ABDOMEN LIMITED
1 series · 14 of 25 positions shown · non-contrast
Comparison: None.

CLINICAL DATA: Right upper quadrant pain

EXAM:
ULTRASOUND ABDOMEN LIMITED RIGHT UPPER QUADRANT

[Series 1: us abdomen limited · 14 of 53 slices shown]
[im 1/53]
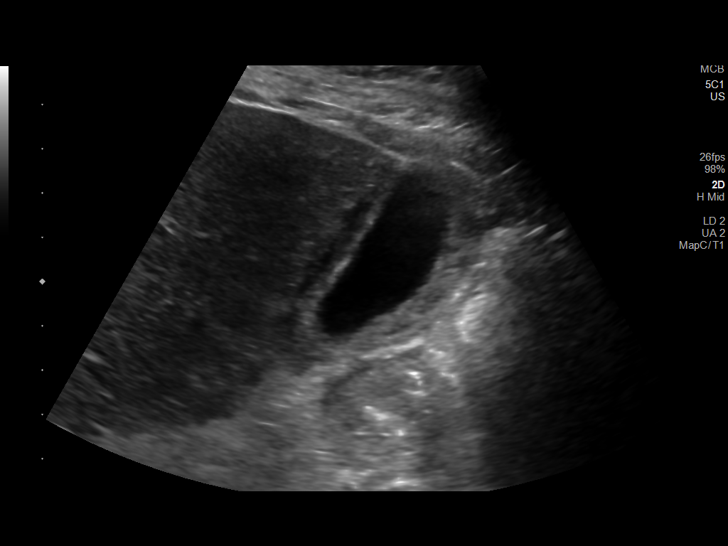
[im 5/53]
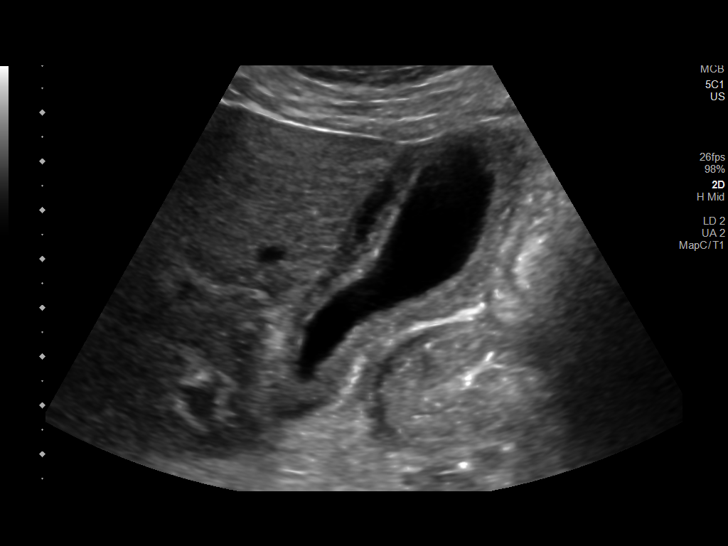
[im 9/53]
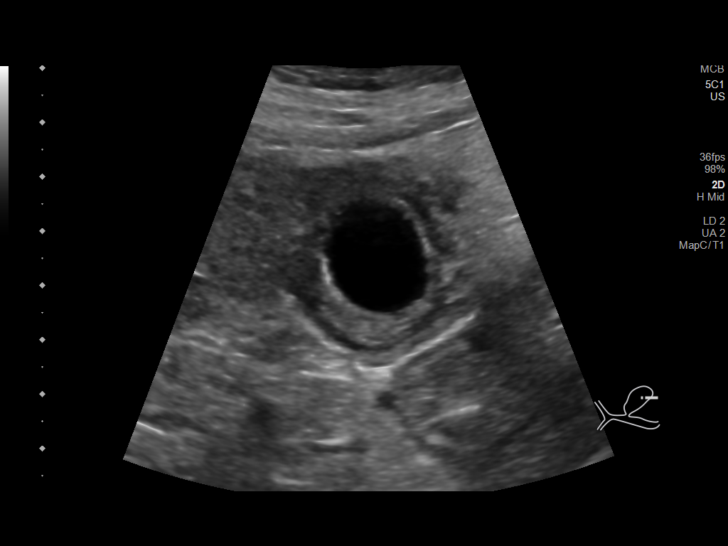
[im 14/53]
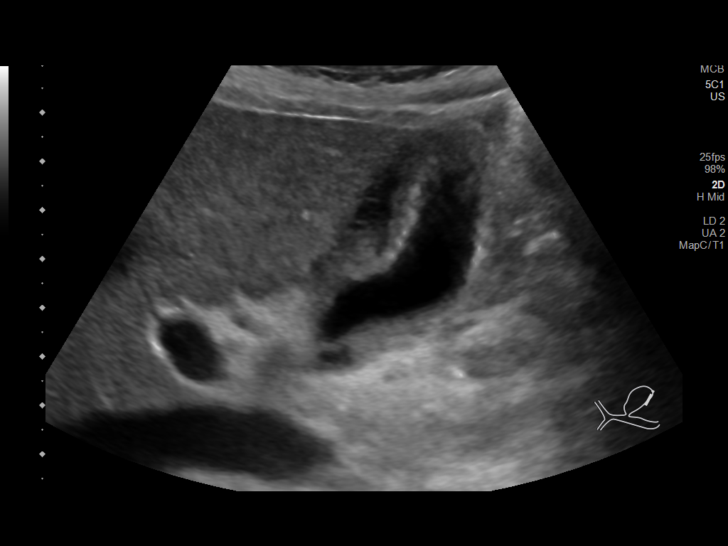
[im 18/53]
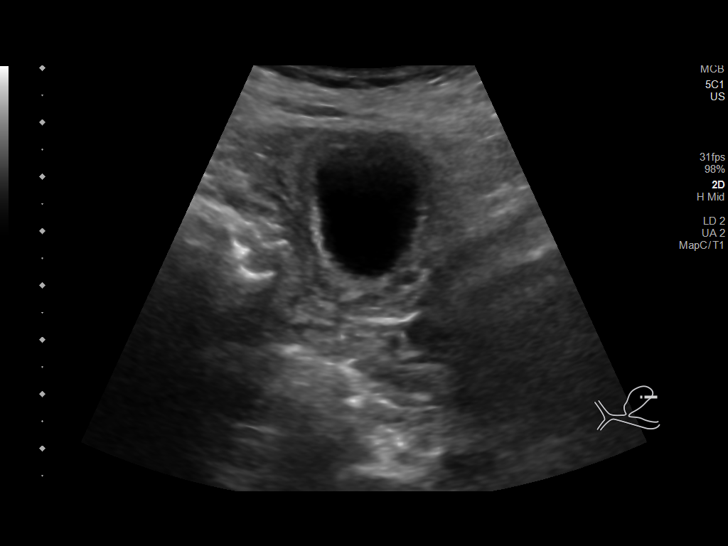
[im 20/53]
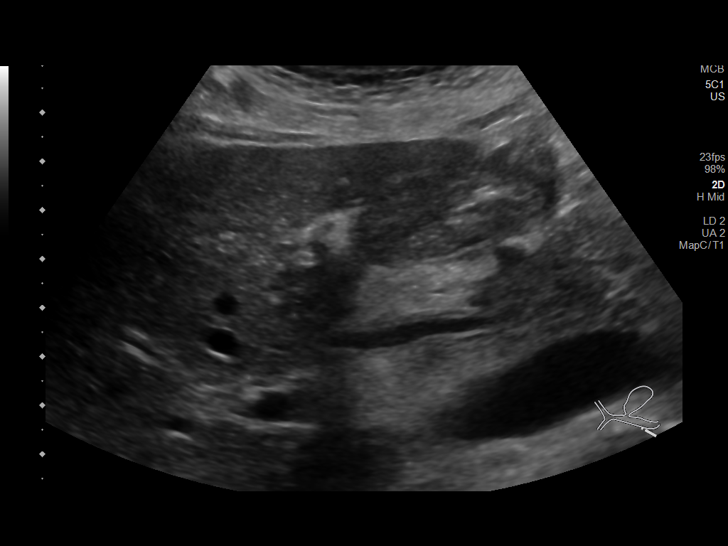
[im 24/53]
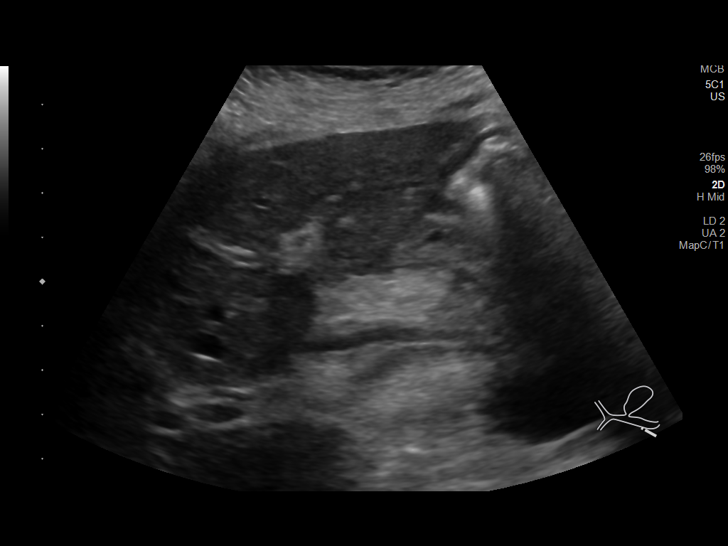
[im 29/53]
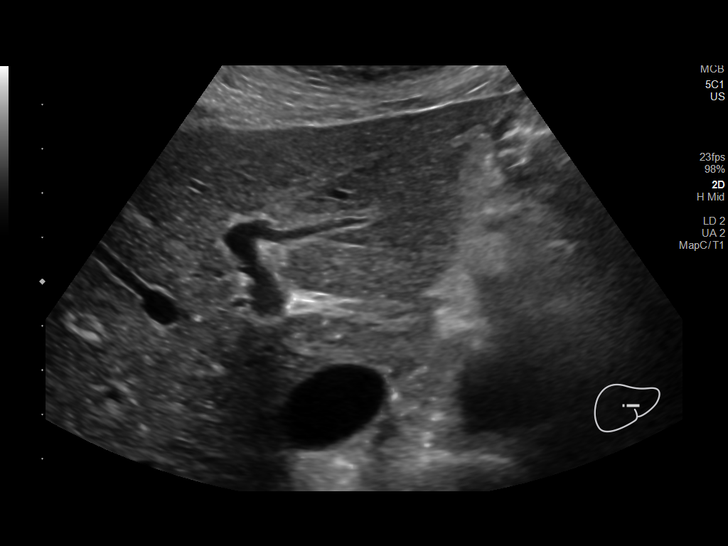
[im 33/53]
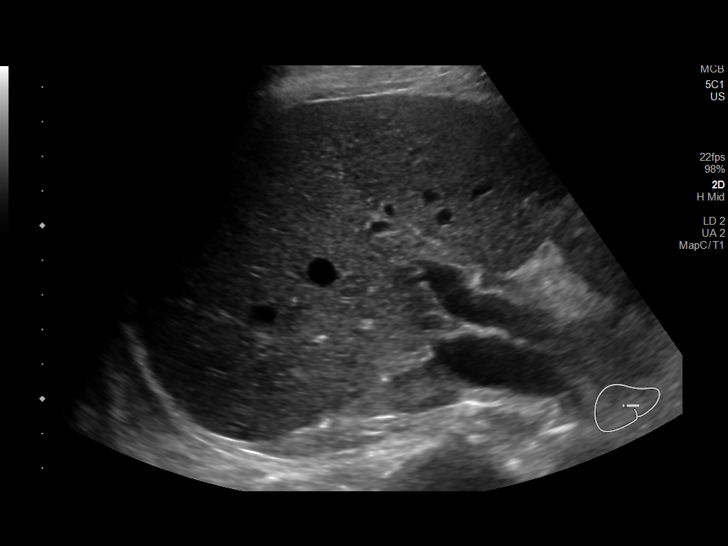
[im 35/53]
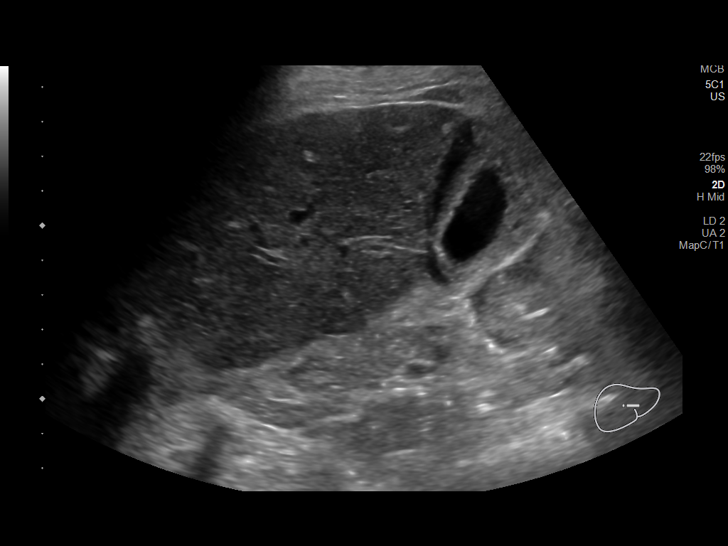
[im 40/53]
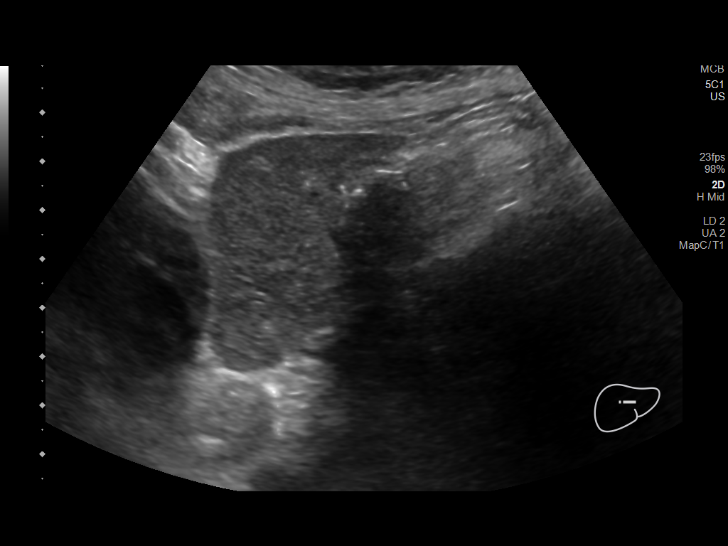
[im 44/53]
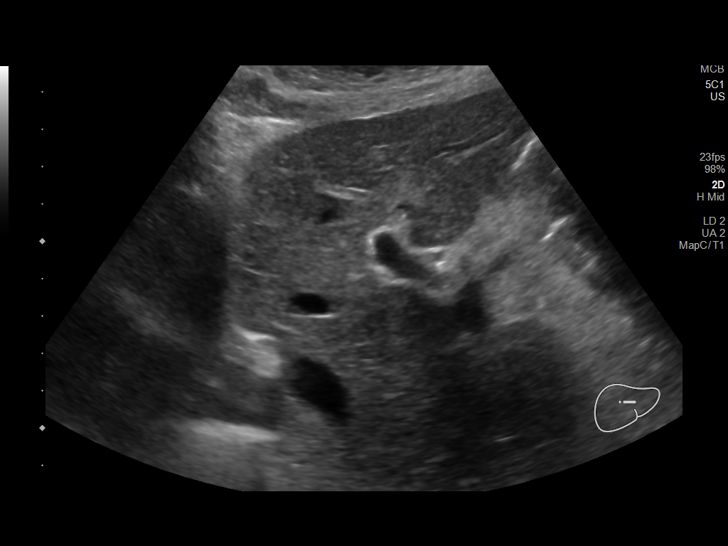
[im 48/53]
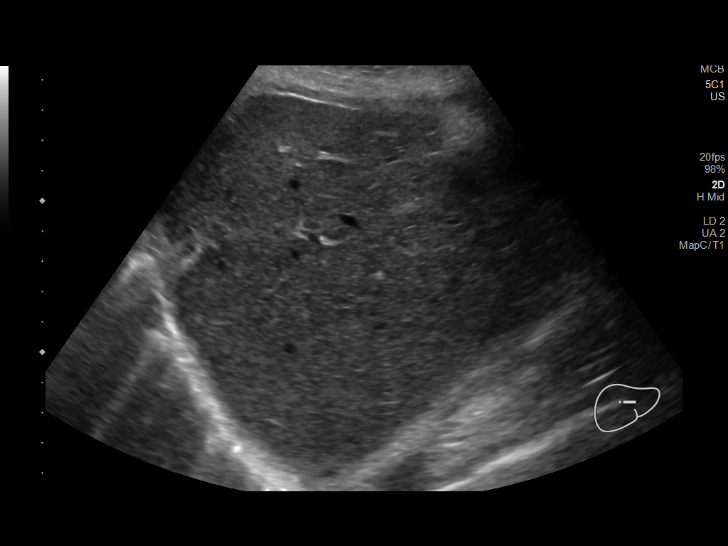
[im 53/53]
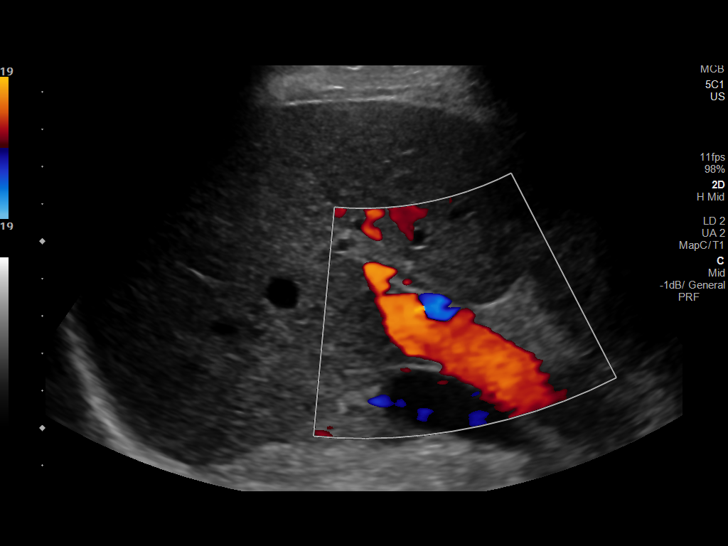

[14 of 25 positions shown; findings below may reference images not displayed]

FINDINGS: Gallbladder:

No gallstones. Positive sonographic Murphy sign noted by
sonographer. Diffuse gallbladder wall thickening with small amount
of pericholecystic fluid.

Common bile duct:

Diameter: 3 mm

Liver:

No focal lesion identified. Within normal limits in parenchymal
echogenicity. Portal vein is patent on color Doppler imaging with
normal direction of blood flow towards the liver.

Other: None.
IMPRESSION: Positive sonographic Murphy sign with gallbladder wall thickening
and pericholecystic fluid, but no cholelithiasis.

## 2020-08-10 IMAGING — NM NM HEPATOBILIARY IMAGE, INC GB
2 series · 12 of 12 positions shown · non-contrast
Comparison: Right upper quadrant ultrasound 03/19/2019.

CLINICAL DATA: Upper abdominal and chest pain approximately 4 days.

EXAM:
NUCLEAR MEDICINE HEPATOBILIARY IMAGING WITH GALLBLADDER EF
TECHNIQUE: Sequential images of the abdomen were obtained [DATE] minutes
following intravenous administration of radiopharmaceutical. After
oral ingestion of Ensure, gallbladder ejection fraction was
determined. At 60 min, normal ejection fraction is greater than 33%.
RADIOPHARMACEUTICALS:  4.78 mCi 5c-PPm  Choletec IV

[he hepatobiliary · 4.52mm/px · 6 of 60 frames shown (1 of 2)]
[frame 6/60]
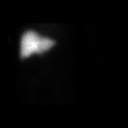
[frame 16/60]
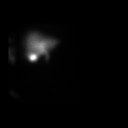
[frame 26/60]
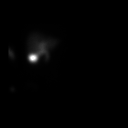
[frame 36/60]
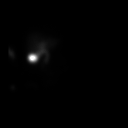
[frame 46/60]
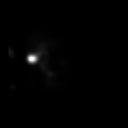
[frame 56/60]
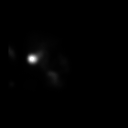

[he hepatobiliary · 4.52mm/px · 6 of 60 frames shown (2 of 2)]
[frame 6/60]
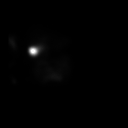
[frame 16/60]
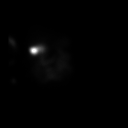
[frame 26/60]
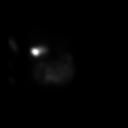
[frame 36/60]
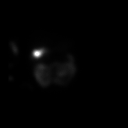
[frame 46/60]
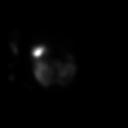
[frame 56/60]
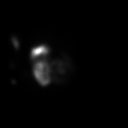

[12 of 12 positions shown; findings below may reference images not displayed]

FINDINGS: Prompt uptake and biliary excretion of activity by the liver is
seen. Gallbladder activity is visualized, consistent with patency of
cystic duct. Biliary activity passes into small bowel, consistent
with patent common bile duct.

Calculated gallbladder ejection fraction is 67%. (Normal gallbladder
ejection fraction with Ensure is greater than 33%.)
IMPRESSION: Normal exam.
